# Patient Record
Sex: Male | Born: 1950
Health system: Southern US, Community
[De-identification: ages and names within clinical notes are randomized; demographics above are authoritative.]

## PROBLEM LIST (undated history)

## (undated) DIAGNOSIS — S329XXA Fracture of unspecified parts of lumbosacral spine and pelvis, initial encounter for closed fracture: Secondary | ICD-10-CM

## (undated) DIAGNOSIS — I739 Peripheral vascular disease, unspecified: Secondary | ICD-10-CM

## (undated) DIAGNOSIS — R091 Pleurisy: Secondary | ICD-10-CM

## (undated) DIAGNOSIS — K219 Gastro-esophageal reflux disease without esophagitis: Secondary | ICD-10-CM

## (undated) DIAGNOSIS — I471 Supraventricular tachycardia, unspecified: Secondary | ICD-10-CM

## (undated) DIAGNOSIS — I10 Essential (primary) hypertension: Secondary | ICD-10-CM

## (undated) DIAGNOSIS — S2249XA Multiple fractures of ribs, unspecified side, initial encounter for closed fracture: Secondary | ICD-10-CM

## (undated) DIAGNOSIS — J302 Other seasonal allergic rhinitis: Secondary | ICD-10-CM

## (undated) DIAGNOSIS — K811 Chronic cholecystitis: Secondary | ICD-10-CM

## (undated) DIAGNOSIS — S42009A Fracture of unspecified part of unspecified clavicle, initial encounter for closed fracture: Secondary | ICD-10-CM

## (undated) DIAGNOSIS — K8020 Calculus of gallbladder without cholecystitis without obstruction: Secondary | ICD-10-CM

## (undated) DIAGNOSIS — E11319 Type 2 diabetes mellitus with unspecified diabetic retinopathy without macular edema: Secondary | ICD-10-CM

## (undated) DIAGNOSIS — R0600 Dyspnea, unspecified: Secondary | ICD-10-CM

## (undated) DIAGNOSIS — E119 Type 2 diabetes mellitus without complications: Secondary | ICD-10-CM

## (undated) DIAGNOSIS — E785 Hyperlipidemia, unspecified: Secondary | ICD-10-CM

## (undated) DIAGNOSIS — K760 Fatty (change of) liver, not elsewhere classified: Secondary | ICD-10-CM

## (undated) DIAGNOSIS — J449 Chronic obstructive pulmonary disease, unspecified: Secondary | ICD-10-CM

## (undated) HISTORY — PX: ERCP: SHX60

## (undated) HISTORY — DX: Type 2 diabetes mellitus with unspecified diabetic retinopathy without macular edema: E11.319

## (undated) HISTORY — PX: FOOT SURGERY: SHX648

## (undated) HISTORY — PX: CHOLECYSTECTOMY: SHX55

---

## 1990-09-27 DIAGNOSIS — S329XXA Fracture of unspecified parts of lumbosacral spine and pelvis, initial encounter for closed fracture: Secondary | ICD-10-CM

## 1990-09-27 DIAGNOSIS — S2249XA Multiple fractures of ribs, unspecified side, initial encounter for closed fracture: Secondary | ICD-10-CM

## 1990-09-27 DIAGNOSIS — S42009A Fracture of unspecified part of unspecified clavicle, initial encounter for closed fracture: Secondary | ICD-10-CM

## 1990-09-27 HISTORY — DX: Fracture of unspecified part of unspecified clavicle, initial encounter for closed fracture: S42.009A

## 1990-09-27 HISTORY — DX: Multiple fractures of ribs, unspecified side, initial encounter for closed fracture: S22.49XA

## 1990-09-27 HISTORY — DX: Fracture of unspecified parts of lumbosacral spine and pelvis, initial encounter for closed fracture: S32.9XXA

## 2003-07-30 ENCOUNTER — Other Ambulatory Visit: Payer: Self-pay

## 2006-09-27 DIAGNOSIS — K760 Fatty (change of) liver, not elsewhere classified: Secondary | ICD-10-CM

## 2006-09-27 HISTORY — DX: Fatty (change of) liver, not elsewhere classified: K76.0

## 2006-09-27 HISTORY — PX: BACK SURGERY: SHX140

## 2006-12-27 ENCOUNTER — Ambulatory Visit: Payer: Self-pay | Admitting: Internal Medicine

## 2006-12-28 ENCOUNTER — Inpatient Hospital Stay: Payer: Self-pay | Admitting: General Surgery

## 2006-12-28 ENCOUNTER — Other Ambulatory Visit: Payer: Self-pay

## 2006-12-28 ENCOUNTER — Ambulatory Visit: Payer: Self-pay | Admitting: Internal Medicine

## 2006-12-28 IMAGING — US ABDOMEN ULTRASOUND
1 series · 17 of 25 positions shown · non-contrast
Comparison: none

REASON FOR EXAM: RUQ abd pain Hx gallstones
COMMENTS:

[Series 1: abdomen ultrasound · 17 of 63 slices shown]
[im 1/63]
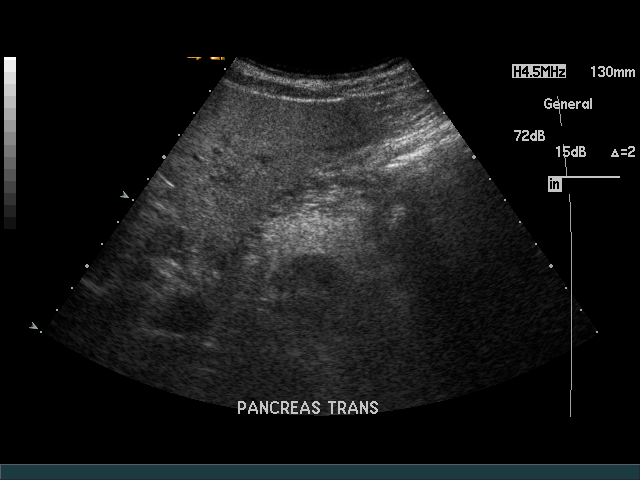
[im 6/63]
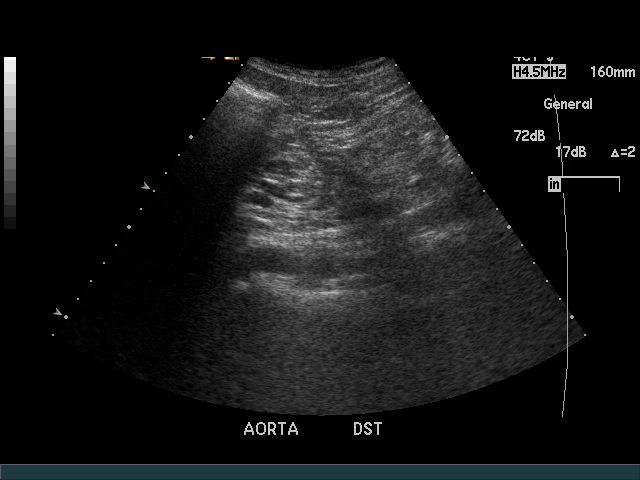
[im 8/63]
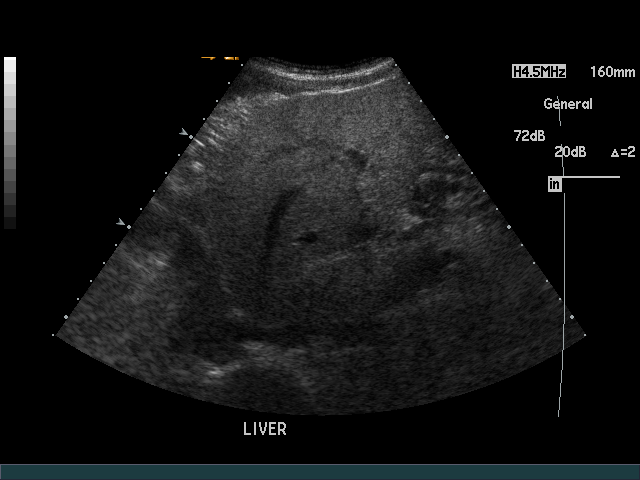
[im 13/63]
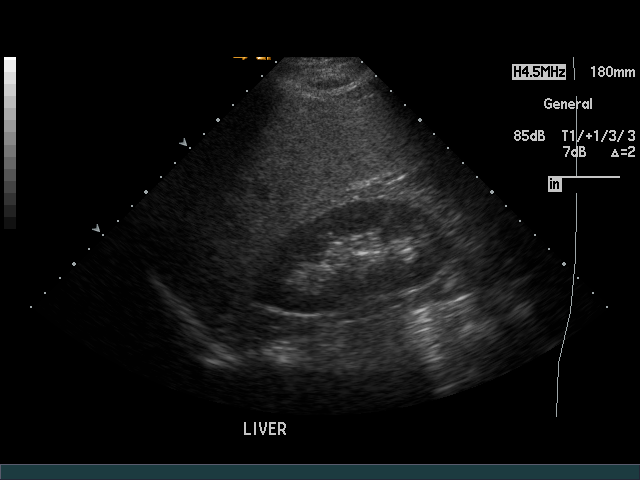
[im 16/63]
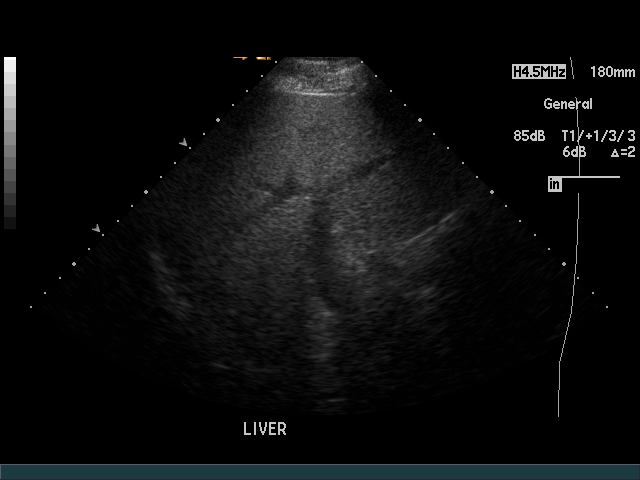
[im 21/63]
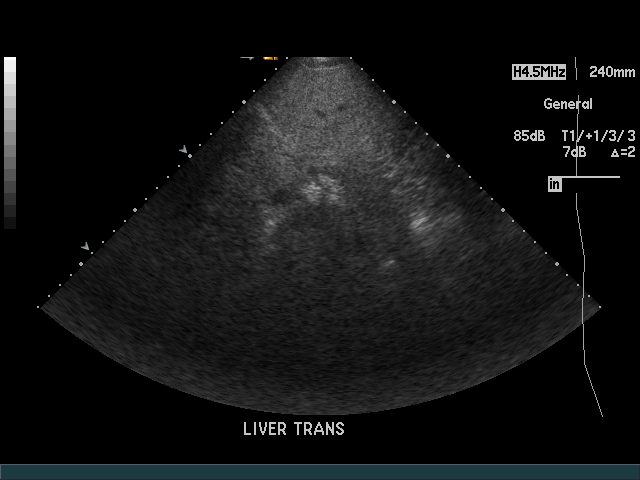
[im 24/63]
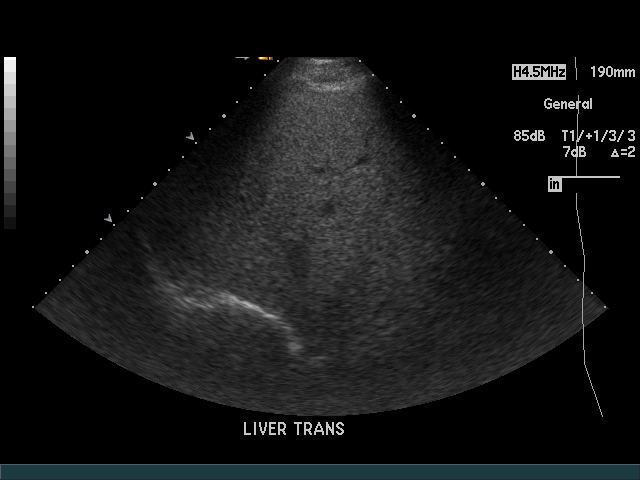
[im 29/63]
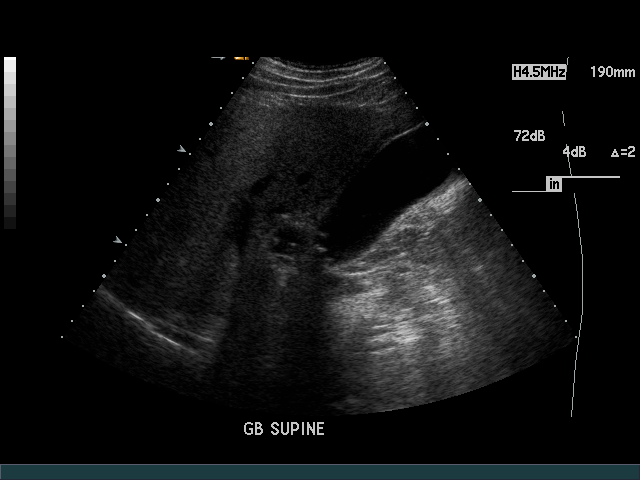
[im 32/63]
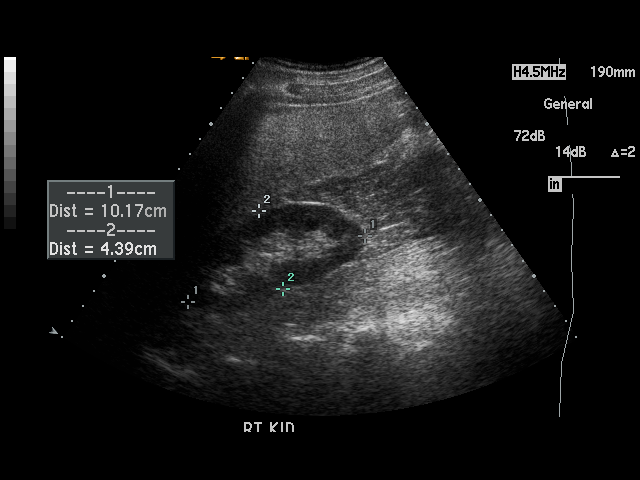
[im 34/63]
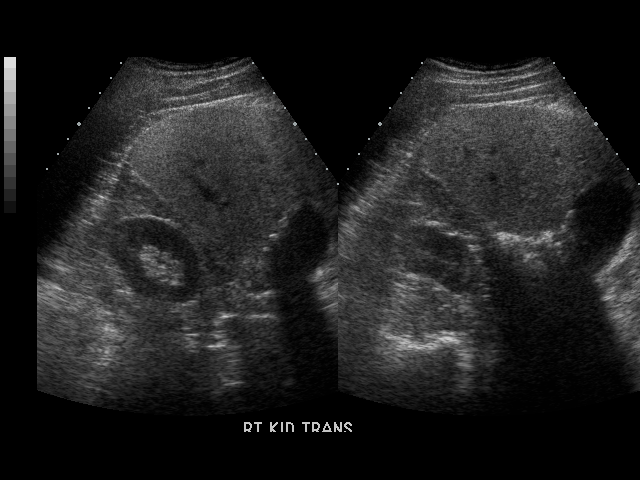
[im 39/63]
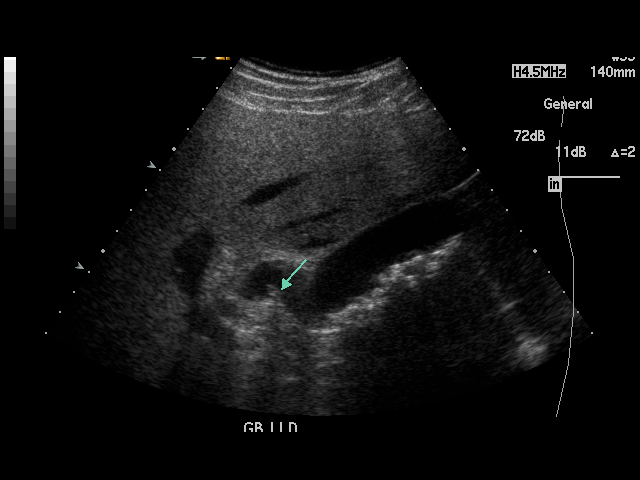
[im 42/63]
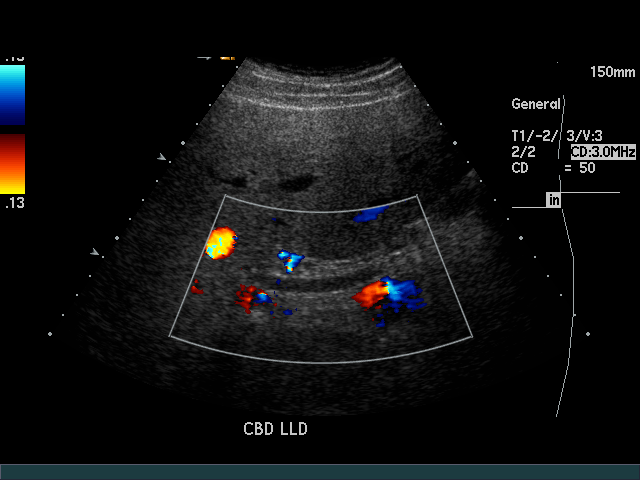
[im 47/63]
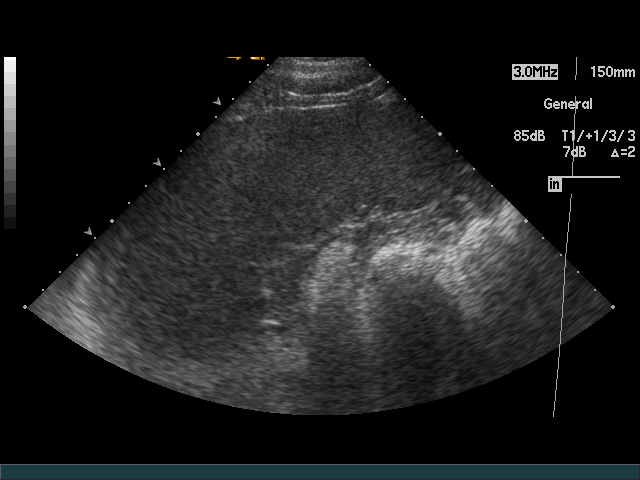
[im 50/63]
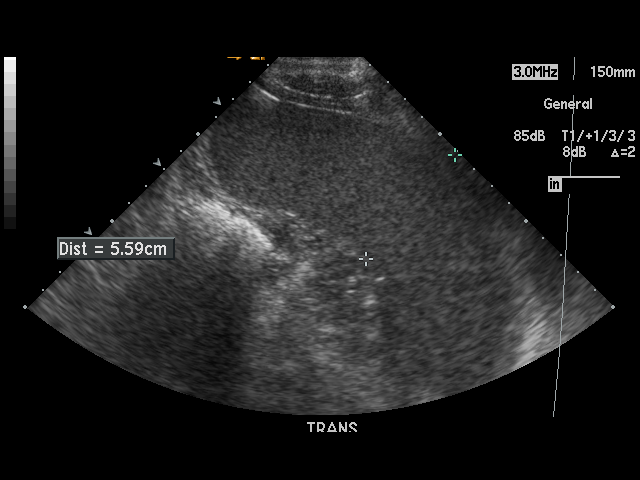
[im 55/63]
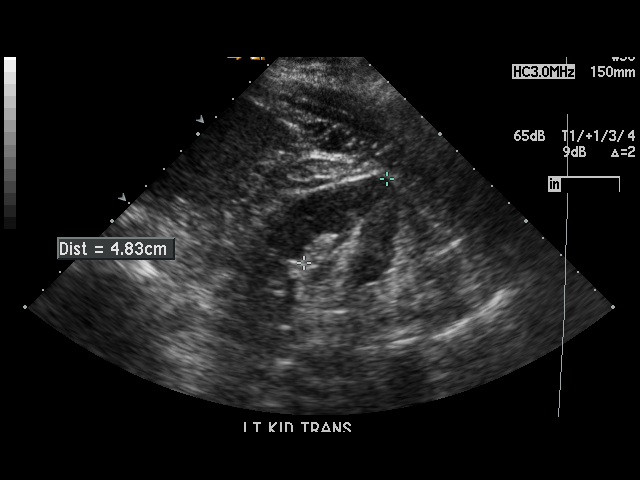
[im 57/63]
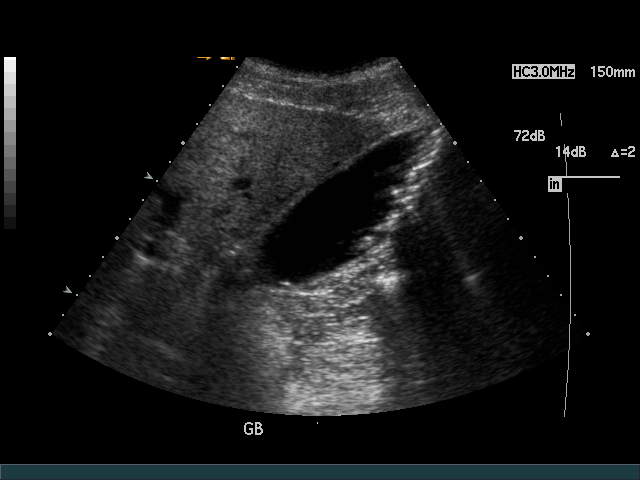
[im 63/63]
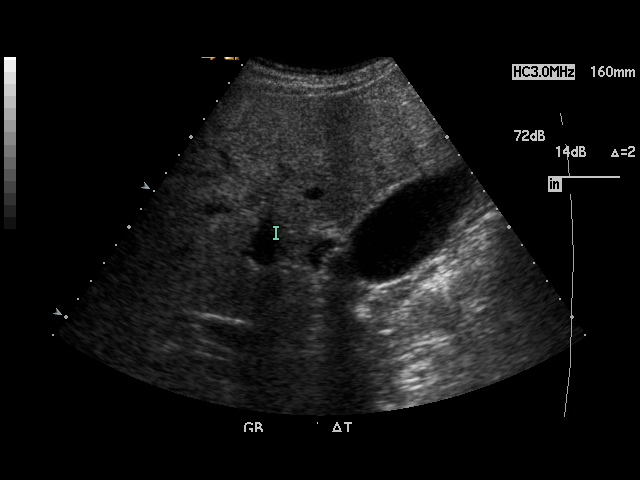

[17 of 25 positions shown; findings below may reference images not displayed]

PROCEDURE:     US  - US ABDOMEN GENERAL SURVEY  - [DATE] [DATE]

RESULT:      The liver exhibits mildly increased echotexture consistent with
fatty infiltration. No focal mass or ductal dilation is seen. Portal venous
flow is normal in direction toward the liver. The gallbladder contains
multiple echogenic shadowing stones. There is a positive sonographic Murphy
sign. The gallbladder wall does not appear abnormally thickened at 2.5 mm. I
cannot exclude a stone in the gallbladder neck. The common bile duct is
mildly dilated at 6.5 mm.

Spleen is enlarged at nearly 17 cm in length. Evaluation of the pancreas is
limited due to the presence of bowel gas. The abdominal aorta and kidneys
exhibit no acute abnormality. There is no evidence of ascites.
IMPRESSION: 1.  There are multiple gallstones present. One stone may be impacted in the
gallbladder neck. No gallbladder wall thickening or pericholecystic fluid is
seen. However, there is a positive sonographic Murphy's sign.
2. The common bile duct is borderline dilated at 6.5 mm.
3. There are findings that are consistent with fatty infiltration of the
liver. The pancreas could only be partially demonstrated.
4. The spleen is enlarged but this was not new finding and has been
described as far back as [AP].
5. The abdominal aorta is top normal in diameter at 2.6 cm.

This report was called at the conclusion of the study.

## 2006-12-30 IMAGING — CR DG CHEST 1V PORT
1 series · 2 of 2 positions shown · non-contrast
Comparison: none

REASON FOR EXAM: renal failure- eval for CHF, pericardial effusion
COMMENTS:

PROCEDURE:     DXR - DXR PORTABLE CHEST SINGLE VIEW  - [DATE]  [DATE]
RESULT:     The lungs are adequately inflated.  The interstitial markings
are mildly increased. The cardiac silhouette is normal in size. The
pulmonary vascularity is indistinct centrally.

[Series 1: view not recorded · 0.17mm/px · 2 of 2 slices shown]
[im 1/2]
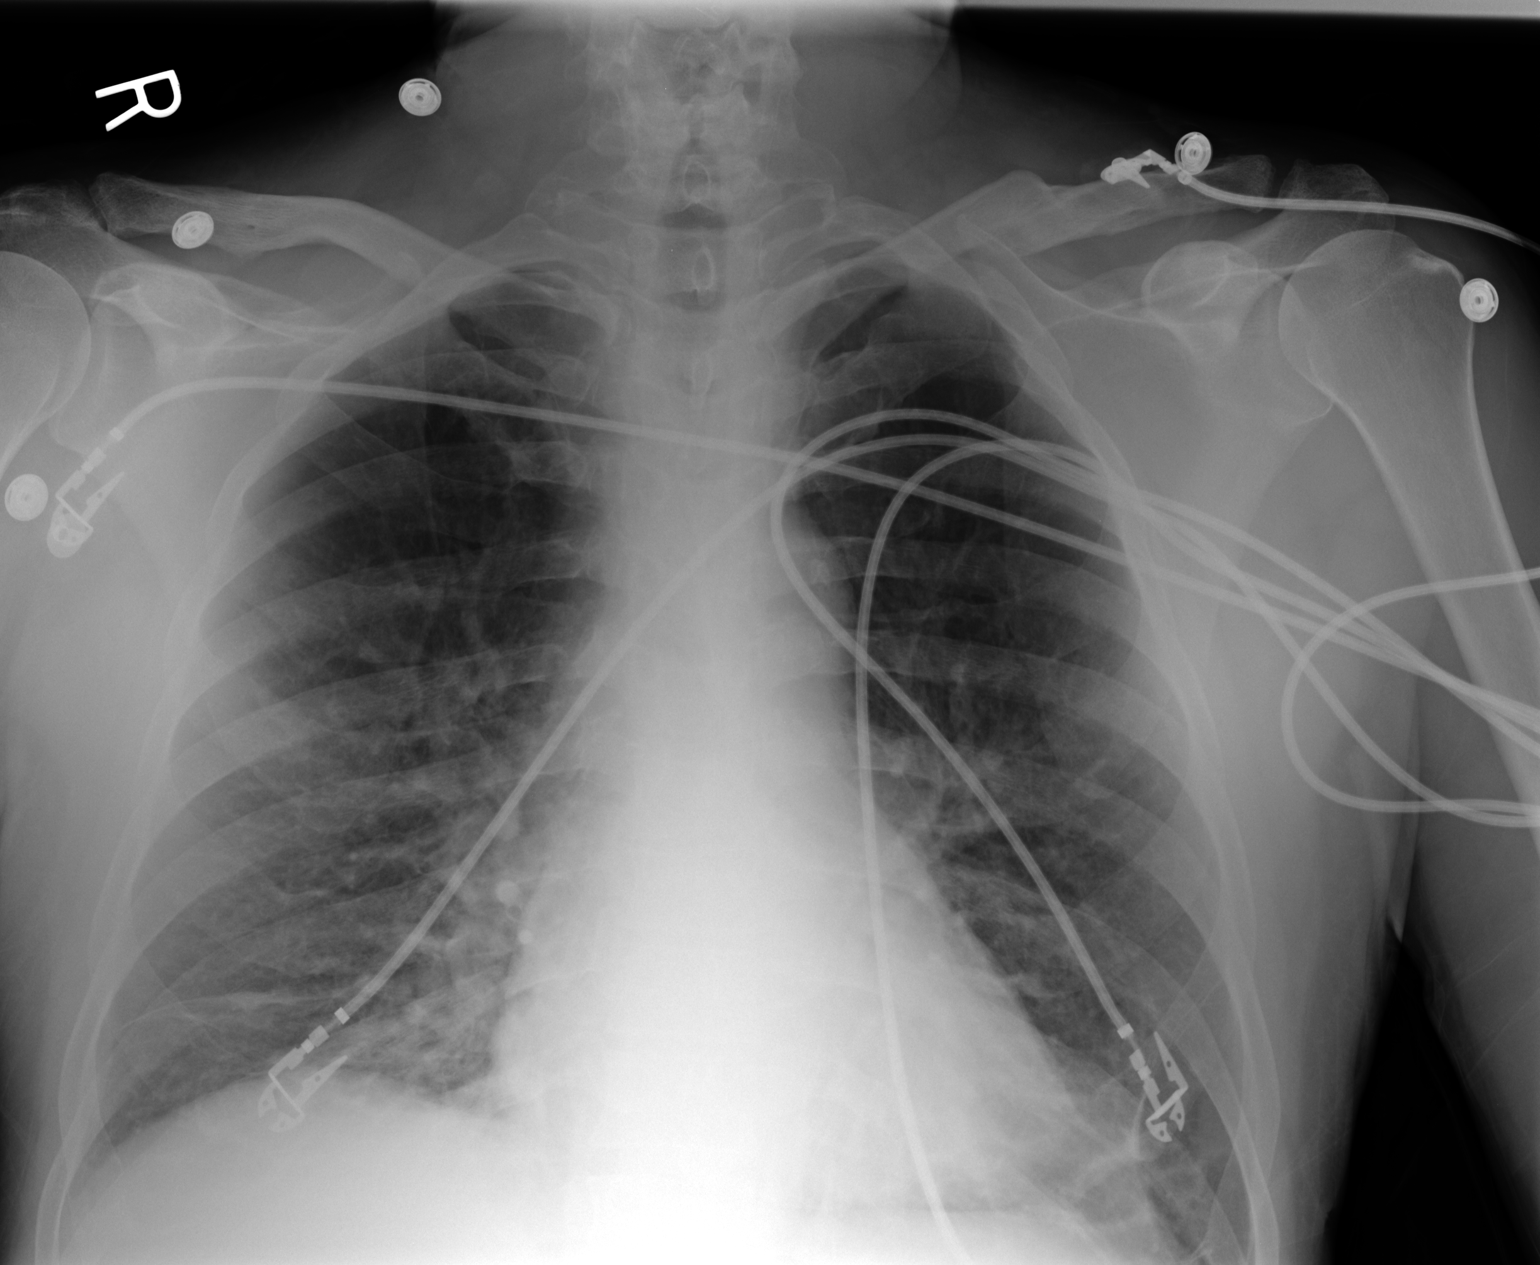
[im 2/2]
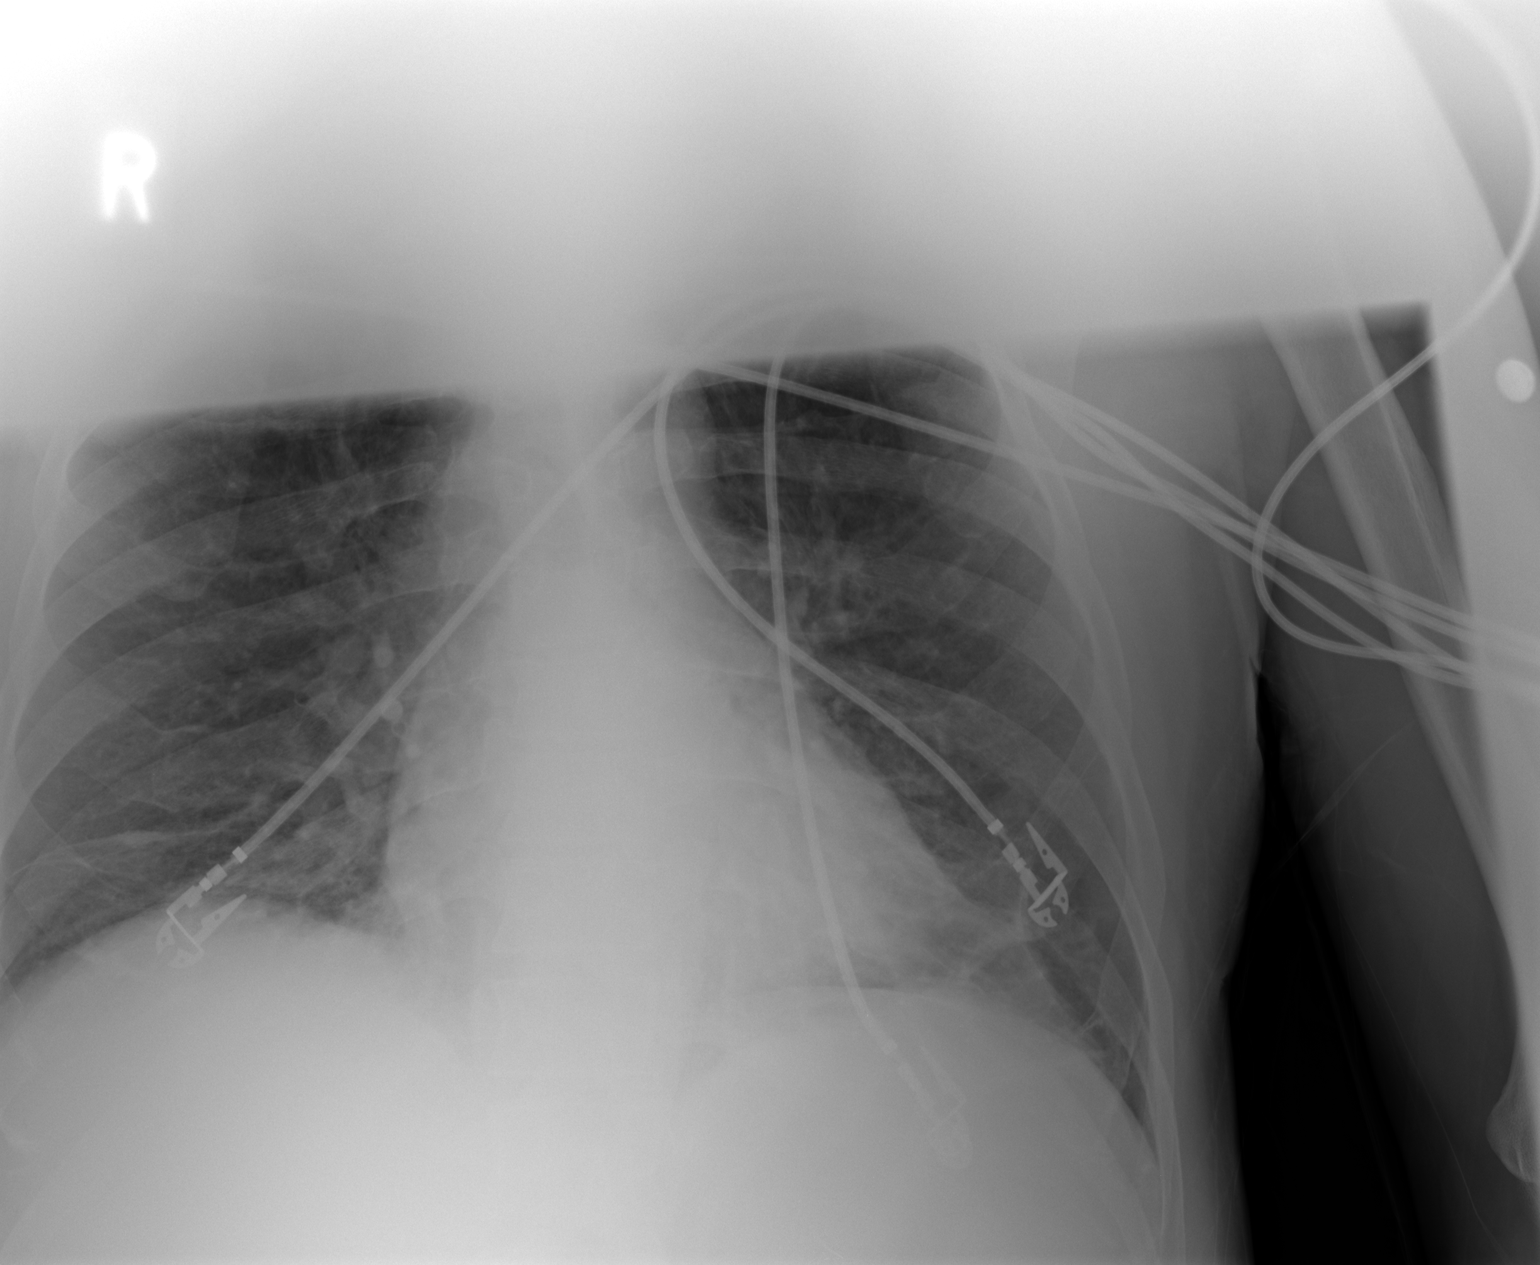

[2 of 2 positions shown; findings below may reference images not displayed]

IMPRESSION: 1.  I do not see evidence of a pleural or pericardial effusion. Mildly
increased interstitial markings may reflect an element of pulmonary
interstitial edema. The cardiac silhouette does not appear enlarged.
2. There is an old healed mid clavicular fracture of the LEFT clavicle.

## 2006-12-30 IMAGING — CT CT ASPIRATION
1 series · 2 of 4 positions shown, 5 images · non-contrast
Comparison: none

REASON FOR EXAM: Abnormal hepatic function
COMMENTS:

[Series 2: (hospital) 2.4 b30s · axial · 0.74mm/px · z∈[-366,-362]mm · 2 of 4 slices shown, 5 images]
[im 2/4  soft-tissue]
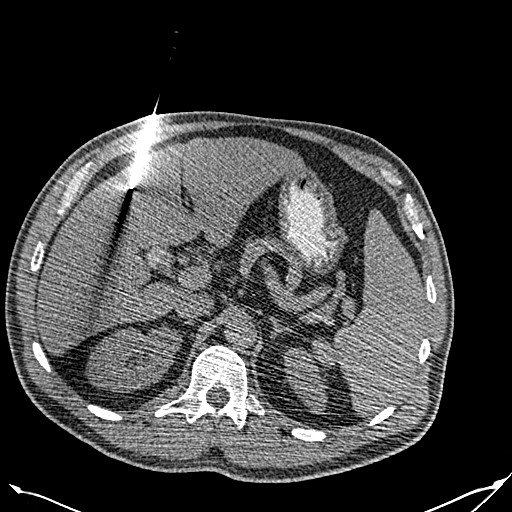
[im 2/4  lung]
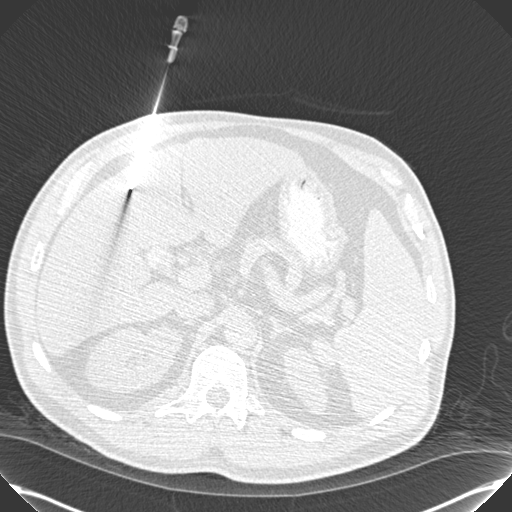
[im 2/4  bone]
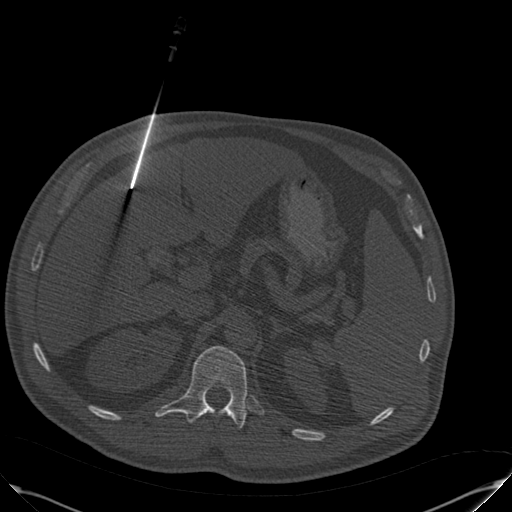
[im 3/4  soft-tissue]
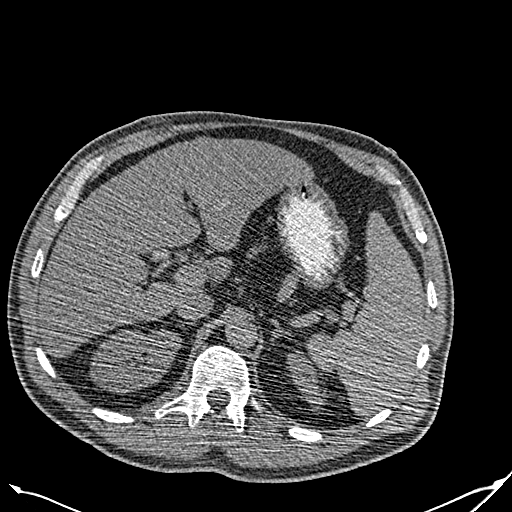
[im 3/4  lung]
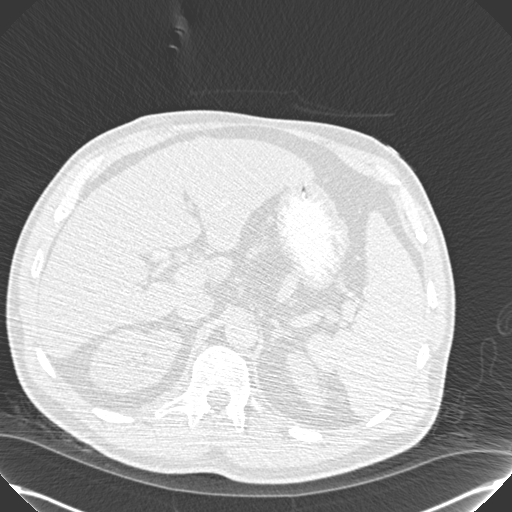

[2 of 4 positions shown; findings below may reference images not displayed]

PROCEDURE:     CT  - CT GUIDED BIOPSY or ASPIRATION  - [DATE]  [DATE]

RESULT:     The patient has a history of liver disease.

PROCEDURE AND FINDINGS:  After discussing the risks and benefits of this
procedure with the patient, including the risk of bleeding and given the
patient's elevated coagulation parameters, informed consent was obtained.
The biopsy was discussed with Dr. RODOLPHO who indicated the need for this
biopsy even with the elevated coagulation parameters.

An 18 gauge, Achieve needle system was advanced into the liver and three
samples were obtained. There were no complications. No evidence of bleeding
post biopsy.
IMPRESSION: Successful CT directed liver biopsy.

## 2006-12-30 IMAGING — CT CT ABD-PELV W/O CM
1 of 2 series · 16 of 32 positions shown, 20 images · non-contrast
Comparison: none

REASON FOR EXAM: Abdominal pain, abnormal hepatic function
COMMENTS:

PROCEDURE:     CT  - CT ABDOMEN AND PELVIS W[DATE] [DATE]
RESULT:     The patient has a history of abdominal pain and abnormal hepatic
function.
TECHNIQUE: Nonenhanced CT scan of abdomen and pelvis is obtained.

[Series 2: soft tissue · axial · 0.75mm/px · z∈[-757,-327]mm · 16 of 94 slices shown, 20 images]
[im 4/94  soft-tissue]
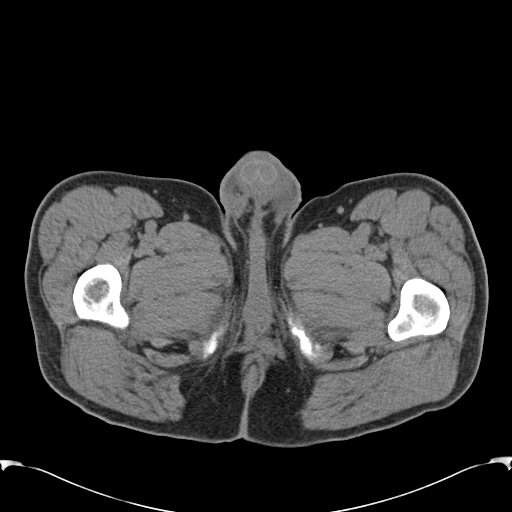
[im 4/94  bone]
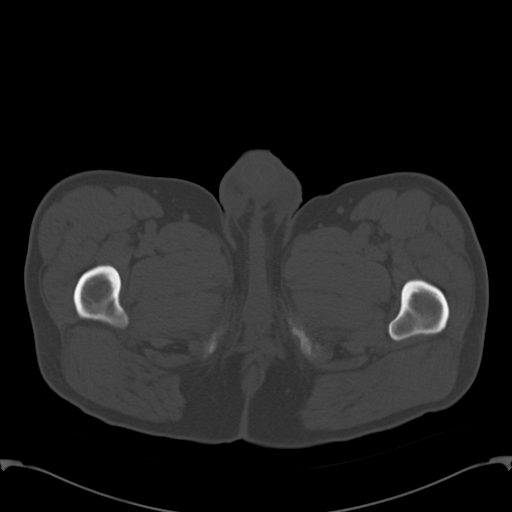
[im 12/94  soft-tissue]
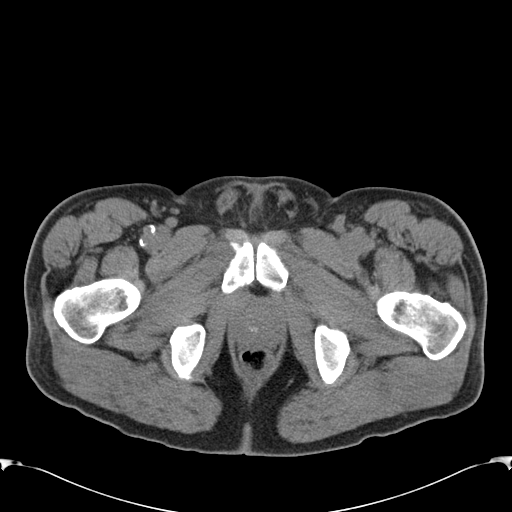
[im 20/94  soft-tissue]
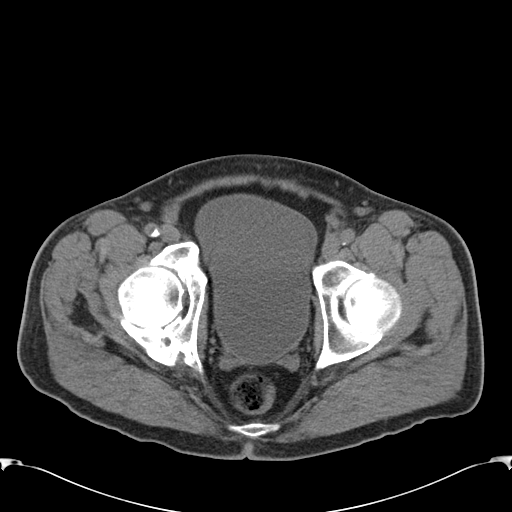
[im 24/94  soft-tissue]
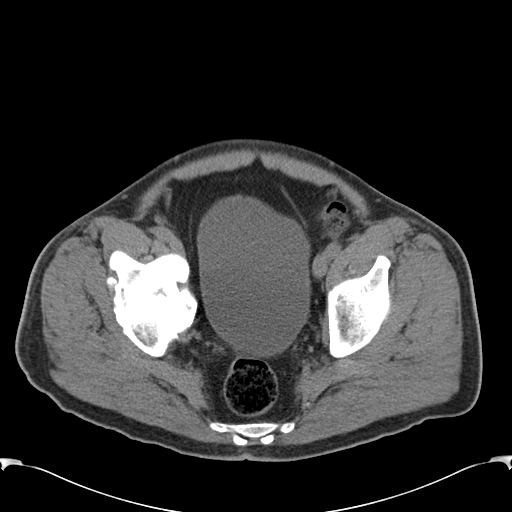
[im 32/94  soft-tissue]
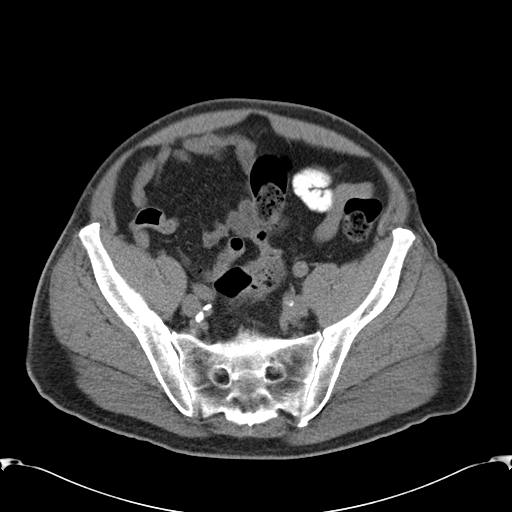
[im 39/94  soft-tissue]
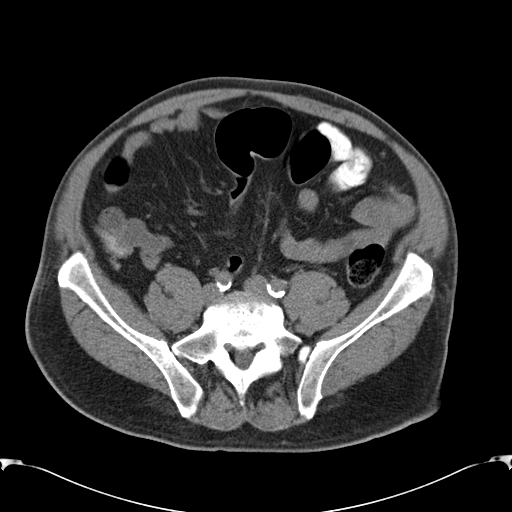
[im 43/94  soft-tissue]
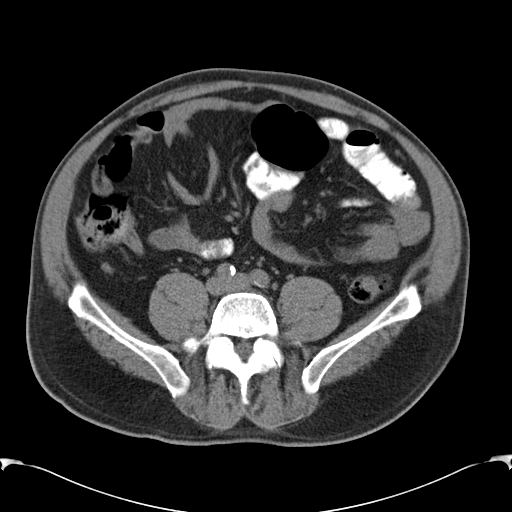
[im 51/94  soft-tissue]
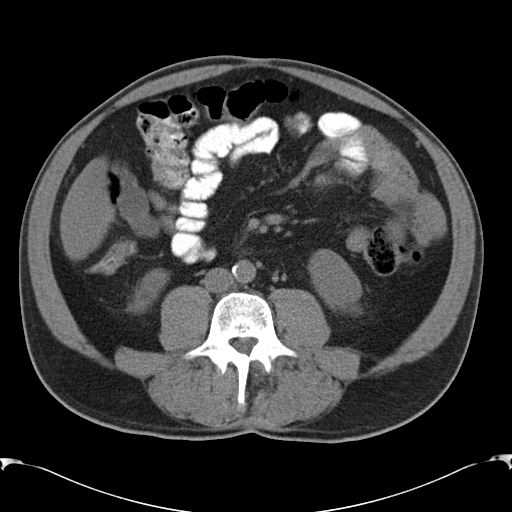
[im 55/94  soft-tissue]
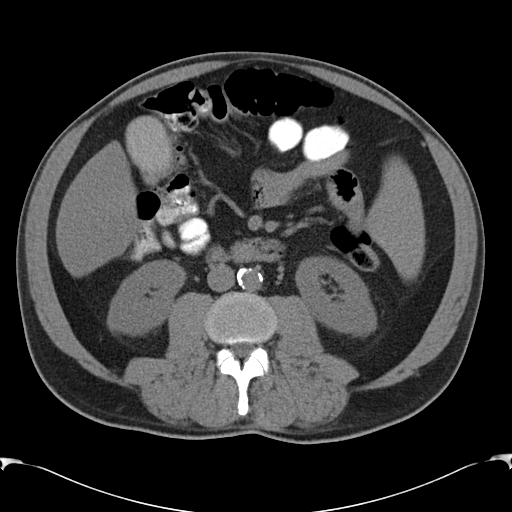
[im 55/94  bone]
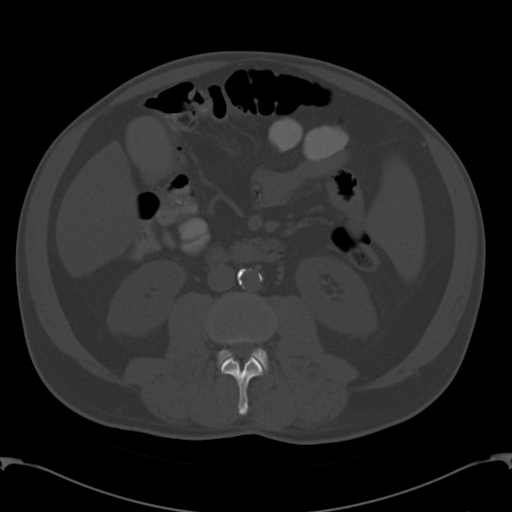
[im 63/94  soft-tissue]
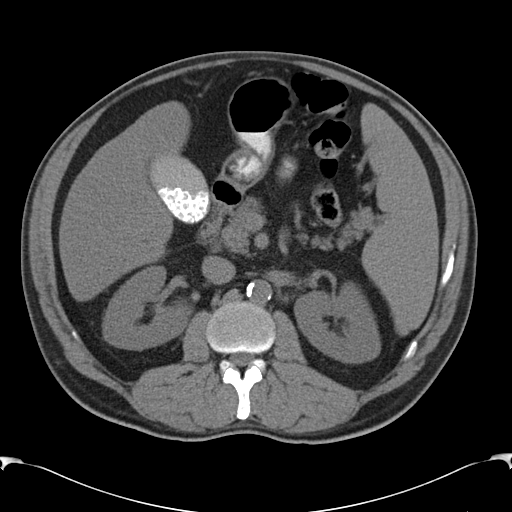
[im 70/94  soft-tissue]
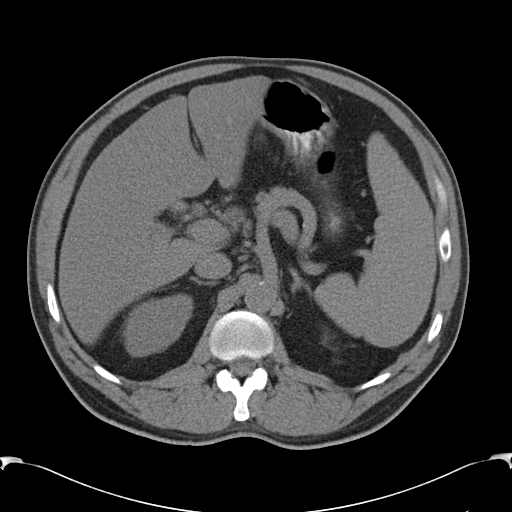
[im 74/94  soft-tissue]
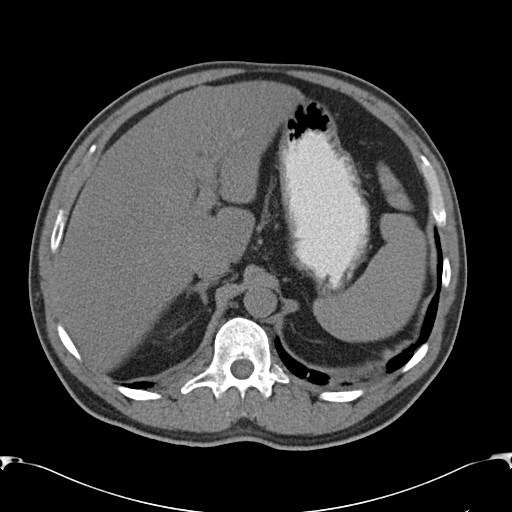
[im 78/94  lung]
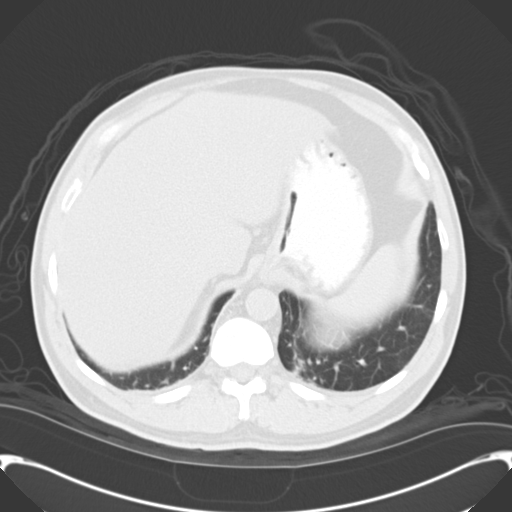
[im 82/94  soft-tissue]
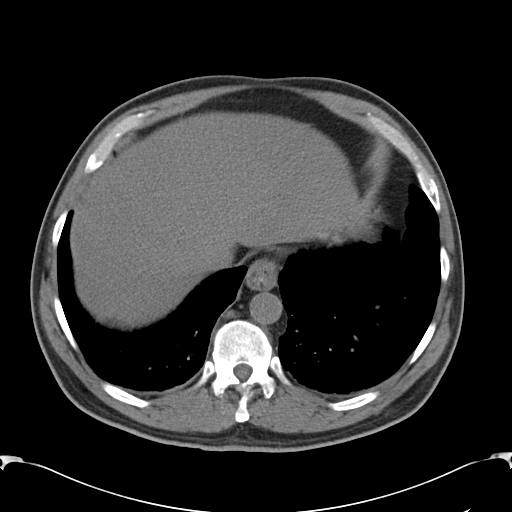
[im 82/94  lung]
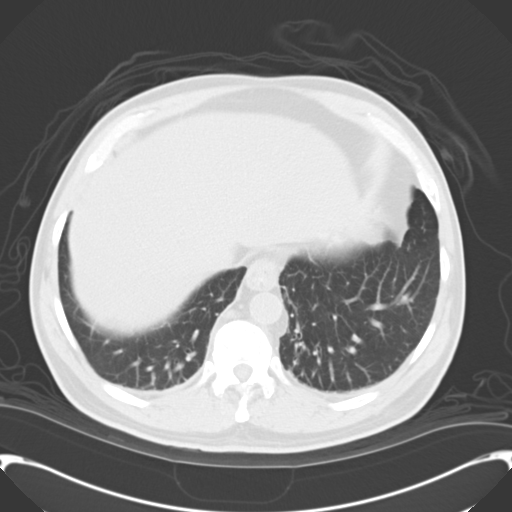
[im 86/94  lung]
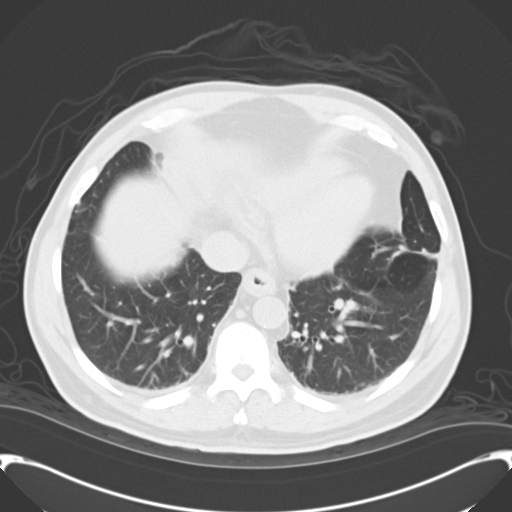
[im 90/94  soft-tissue]
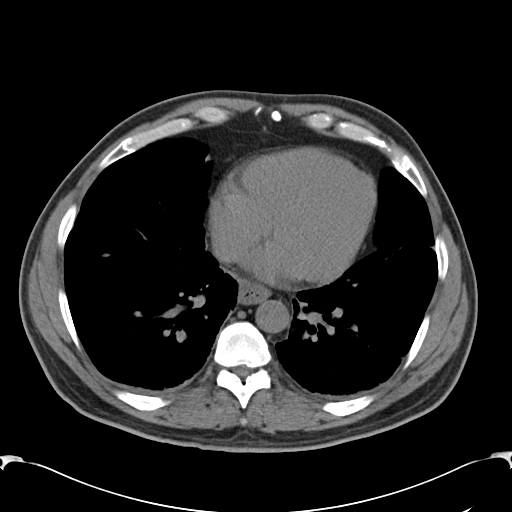
[im 90/94  lung]
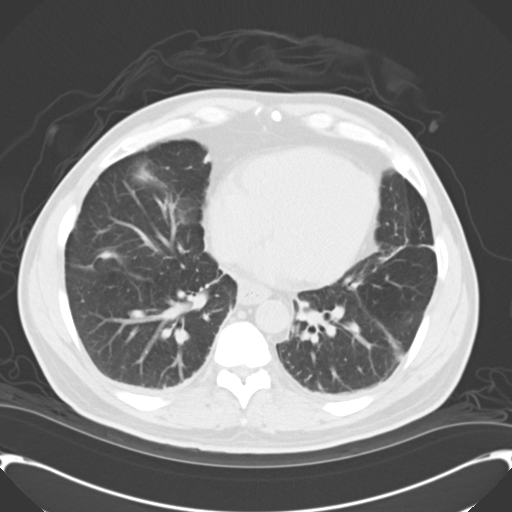

[16 of 32 positions shown; findings below may reference images not displayed]

FINDINGS: The liver is normal. Borderline splenomegaly is present.
Gallstones are present. There is no gallbladder wall thickening or
pericholecystic fluid collections. The adrenals and kidneys are normal.
There is no bowel distension. The appendix is normal. The bladder is normal.
No inguinal adenopathy is noted. Basilar atelectasis is present.
IMPRESSION: 1.     Cholelithiasis.
2.     Borderline splenomegaly.

## 2007-01-18 ENCOUNTER — Ambulatory Visit: Payer: Self-pay | Admitting: Internal Medicine

## 2007-01-26 ENCOUNTER — Ambulatory Visit: Payer: Self-pay | Admitting: Internal Medicine

## 2007-01-30 ENCOUNTER — Ambulatory Visit: Payer: Self-pay | Admitting: Internal Medicine

## 2007-03-15 ENCOUNTER — Ambulatory Visit: Payer: Self-pay | Admitting: Internal Medicine

## 2007-03-28 ENCOUNTER — Ambulatory Visit: Payer: Self-pay | Admitting: Internal Medicine

## 2007-04-28 ENCOUNTER — Ambulatory Visit: Payer: Self-pay | Admitting: Internal Medicine

## 2007-05-24 ENCOUNTER — Ambulatory Visit: Payer: Self-pay | Admitting: Internal Medicine

## 2007-05-24 IMAGING — US ABDOMEN ULTRASOUND
1 series · 17 of 25 positions shown · non-contrast
Comparison: none

REASON FOR EXAM: Splenomegally
COMMENTS:

[Series 1: abdomen ultrasound · 17 of 47 slices shown]
[im 1/47]
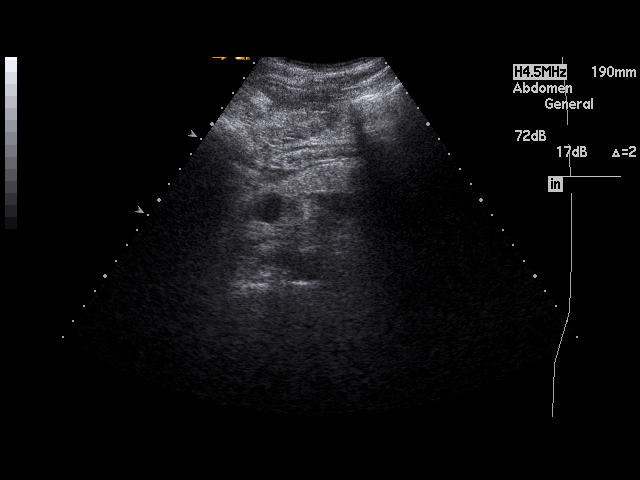
[im 4/47]
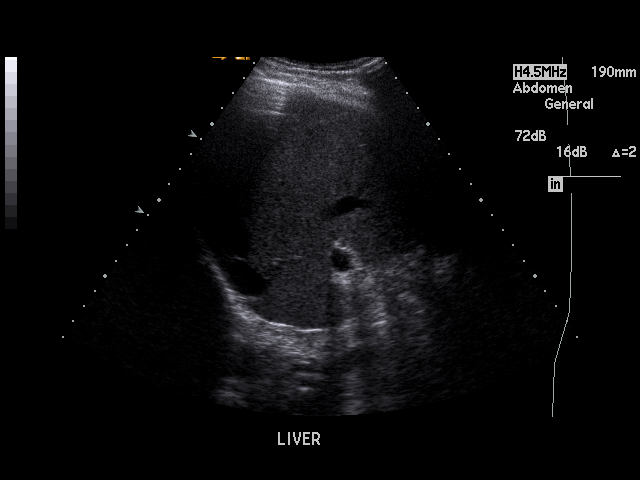
[im 6/47]
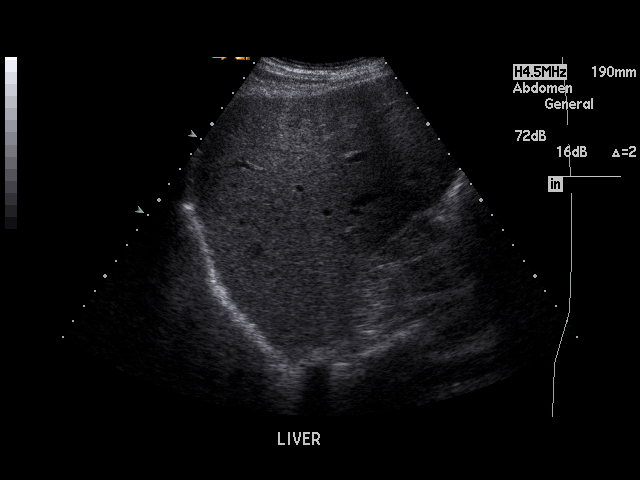
[im 10/47]
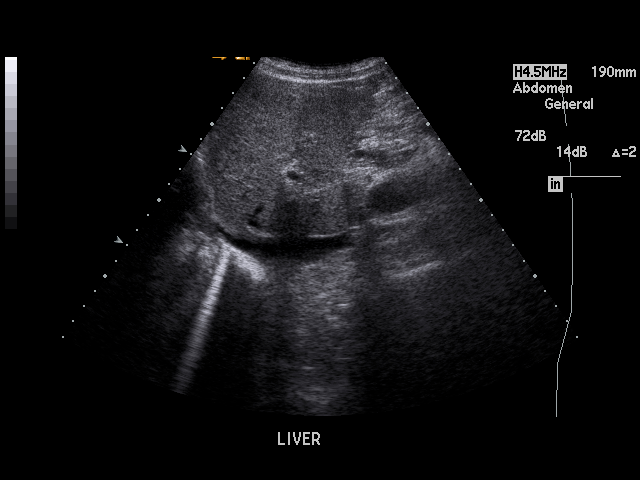
[im 12/47]
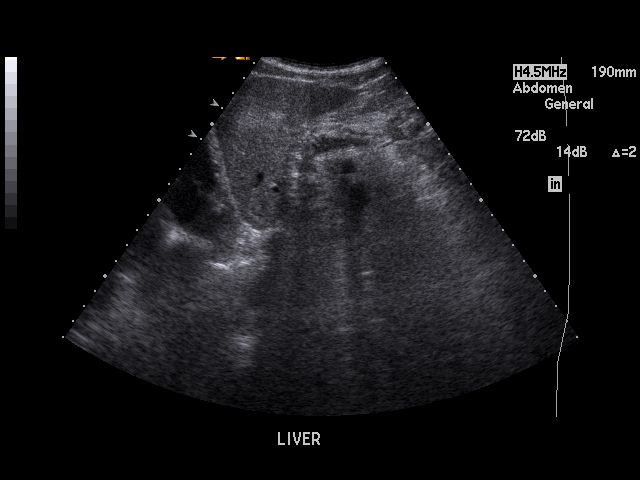
[im 16/47]
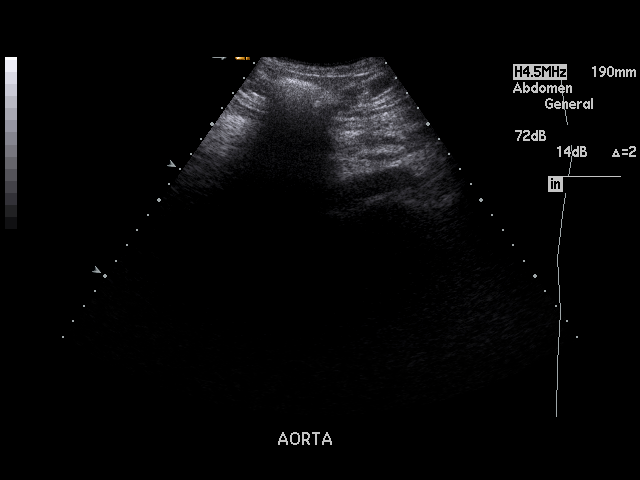
[im 18/47]
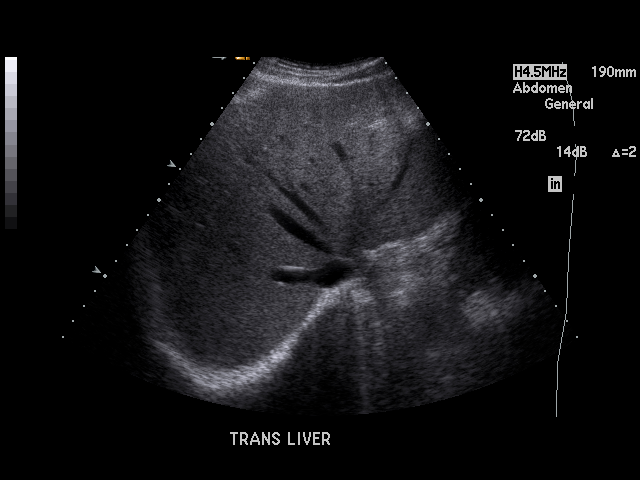
[im 22/47]
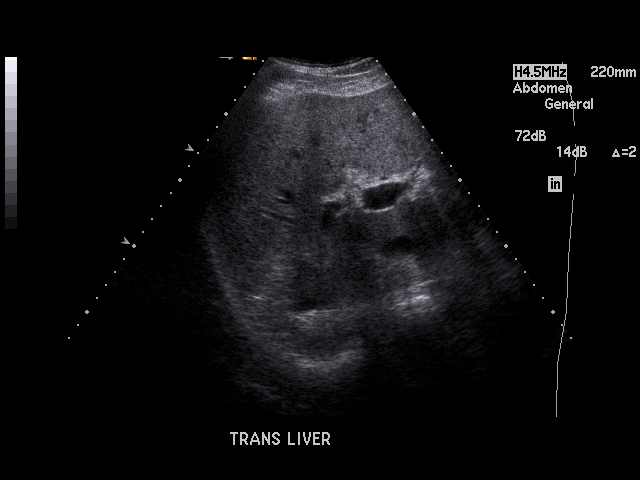
[im 24/47]
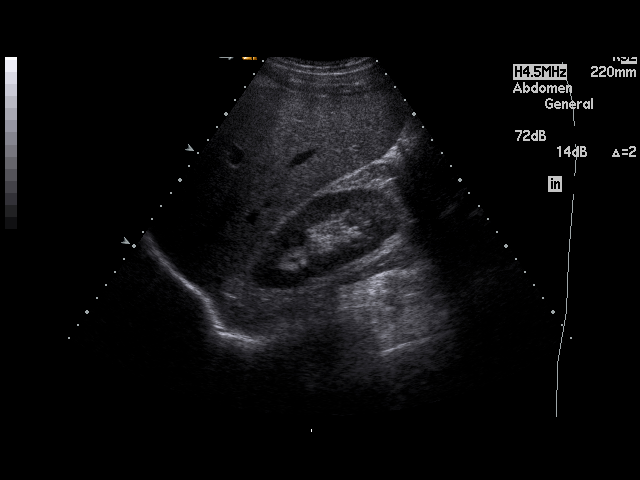
[im 25/47]
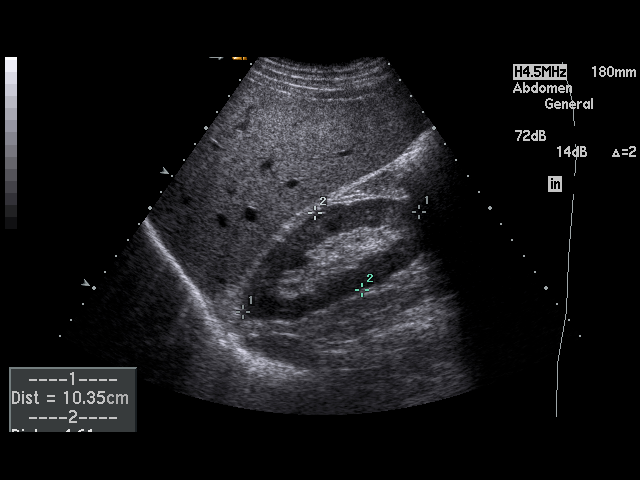
[im 29/47]
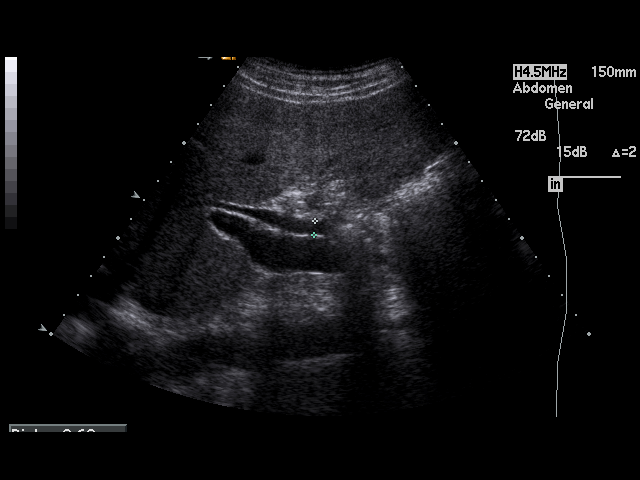
[im 31/47]
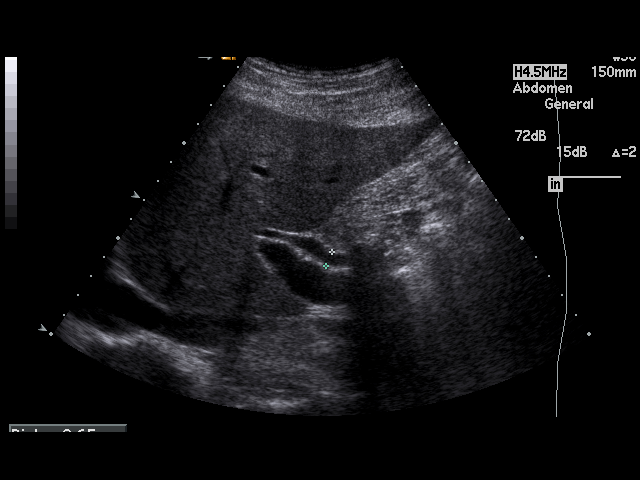
[im 35/47]
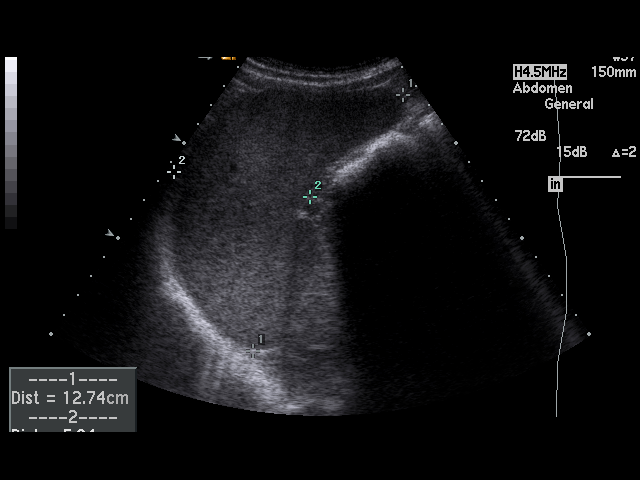
[im 37/47]
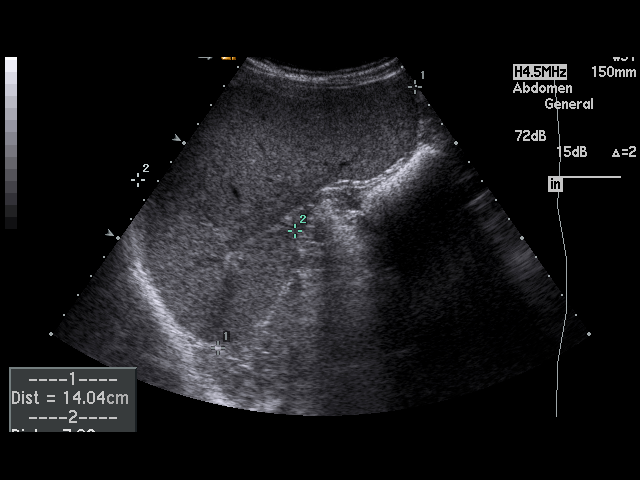
[im 41/47]
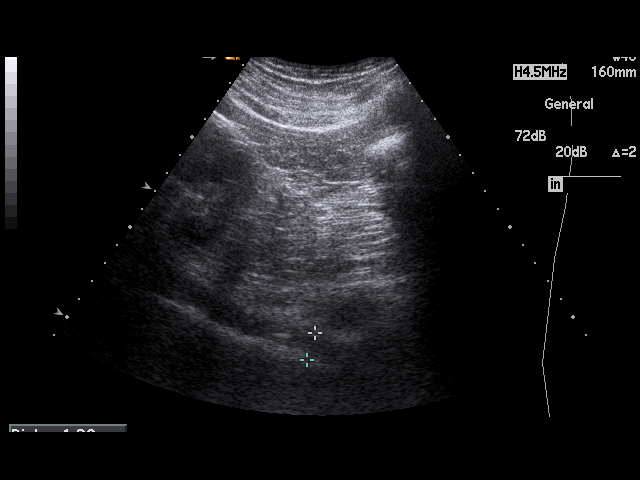
[im 43/47]
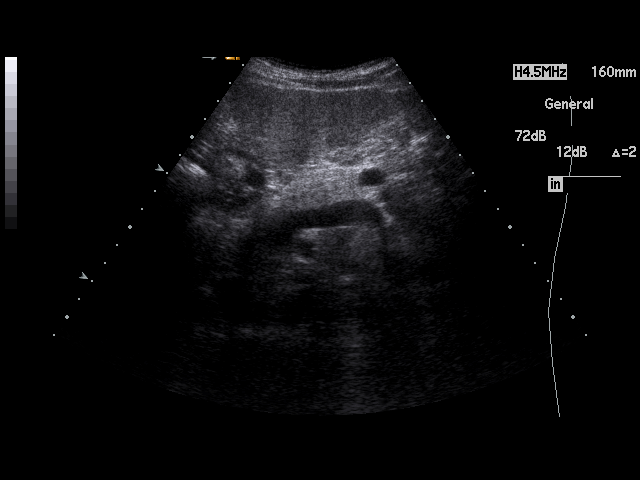
[im 47/47]
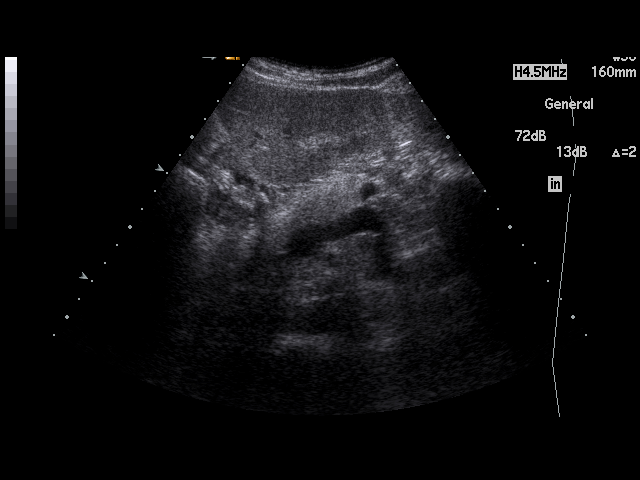

[17 of 25 positions shown; findings below may reference images not displayed]

PROCEDURE:     US  - US ABDOMEN GENERAL SURVEY  - [DATE] [DATE]

RESULT:     Comparison is made to a prior exam of [DATE]. On the current
exam, the hepatic echo pattern appears normal. The spleen is mildly
enlarged. The spleen measured 14.15 cm in AP diameter. The spleen size has
decreased since the prior exam of [DATE] at which time the spleen
measured 16.7 cm in AP diameter. The pancreas is normal in appearance. The
patient is status post cholecystectomy. The kidneys show no hydronephrosis.
There is no ascites.
IMPRESSION: 1. The patient is status post cholecystectomy.
2. The common bile duct measures 6.6 mm in diameter which is within normal
limits.
3. The spleen is enlarged but has decreased in size since the prior exam.

## 2007-05-29 ENCOUNTER — Ambulatory Visit: Payer: Self-pay | Admitting: Internal Medicine

## 2007-08-18 ENCOUNTER — Ambulatory Visit: Payer: Self-pay | Admitting: Family Medicine

## 2007-08-18 IMAGING — CR RIGHT HIP - COMPLETE 2+ VIEW
1 series · 2 of 2 positions shown · non-contrast
Comparison: none

REASON FOR EXAM: pain
COMMENTS:

[Series 1: view not recorded · 0.17mm/px · 2 of 2 slices shown]
[im 1/2]
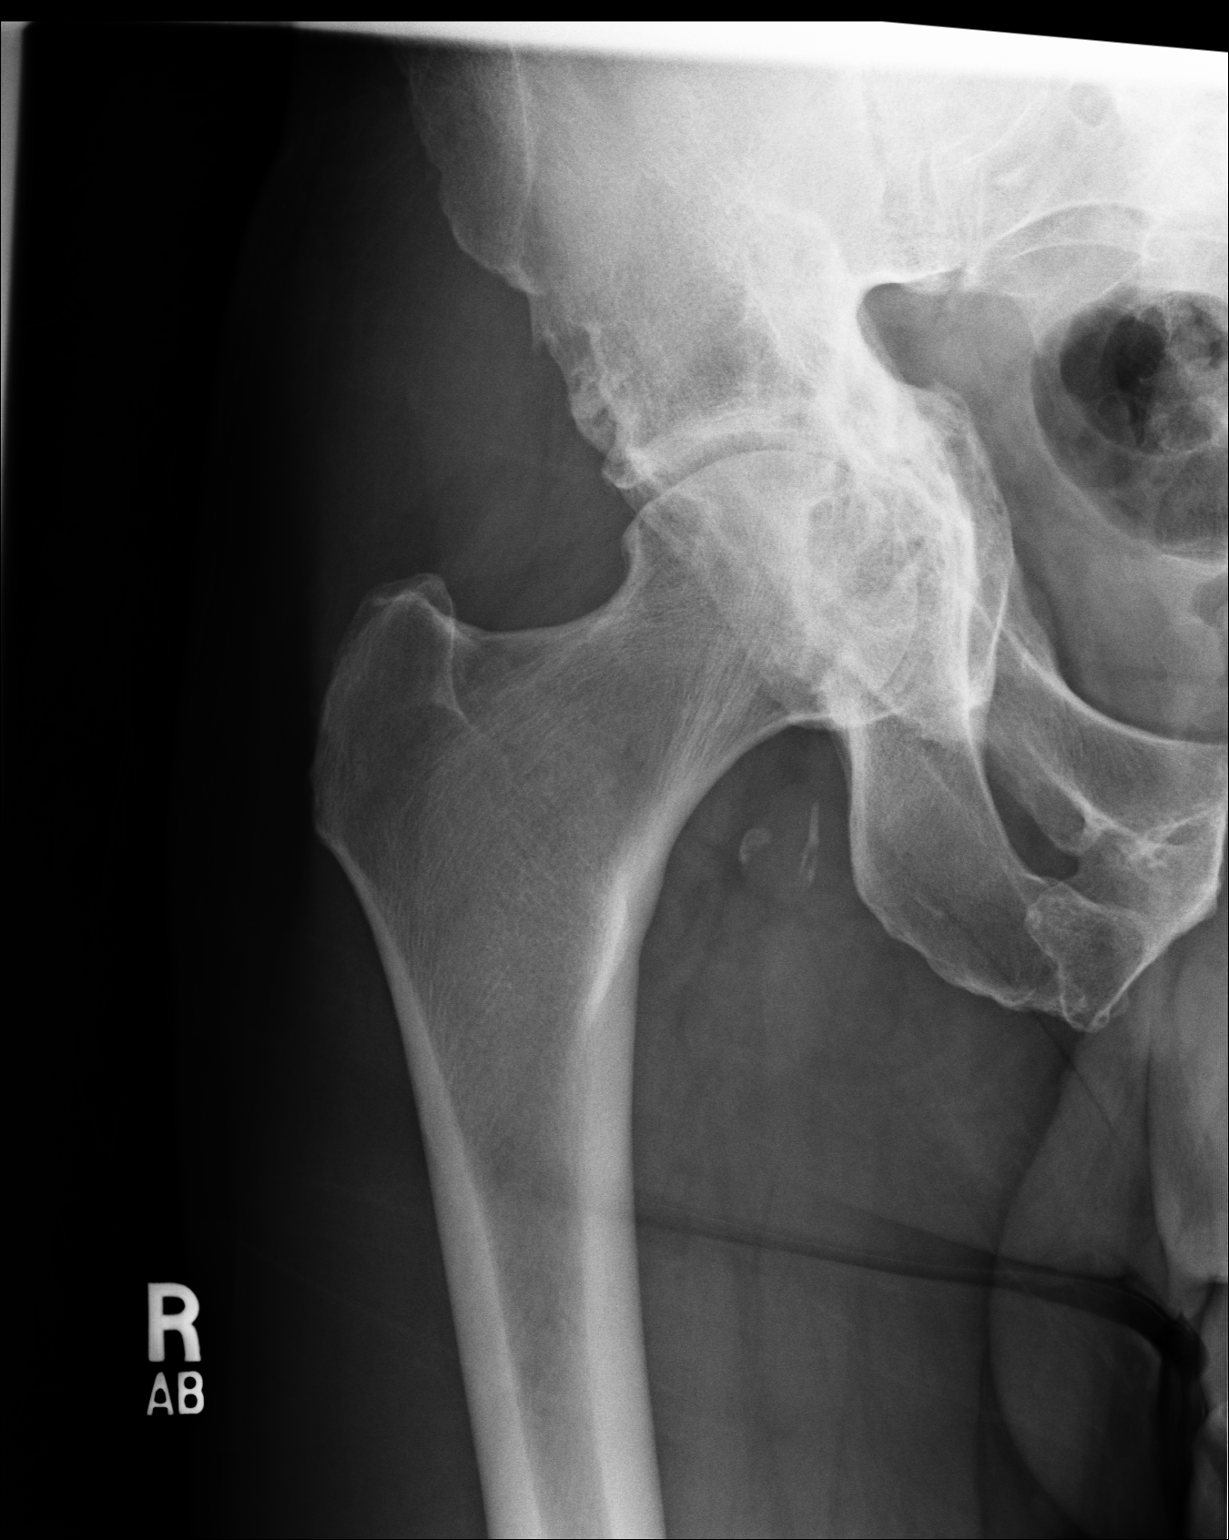
[im 2/2]
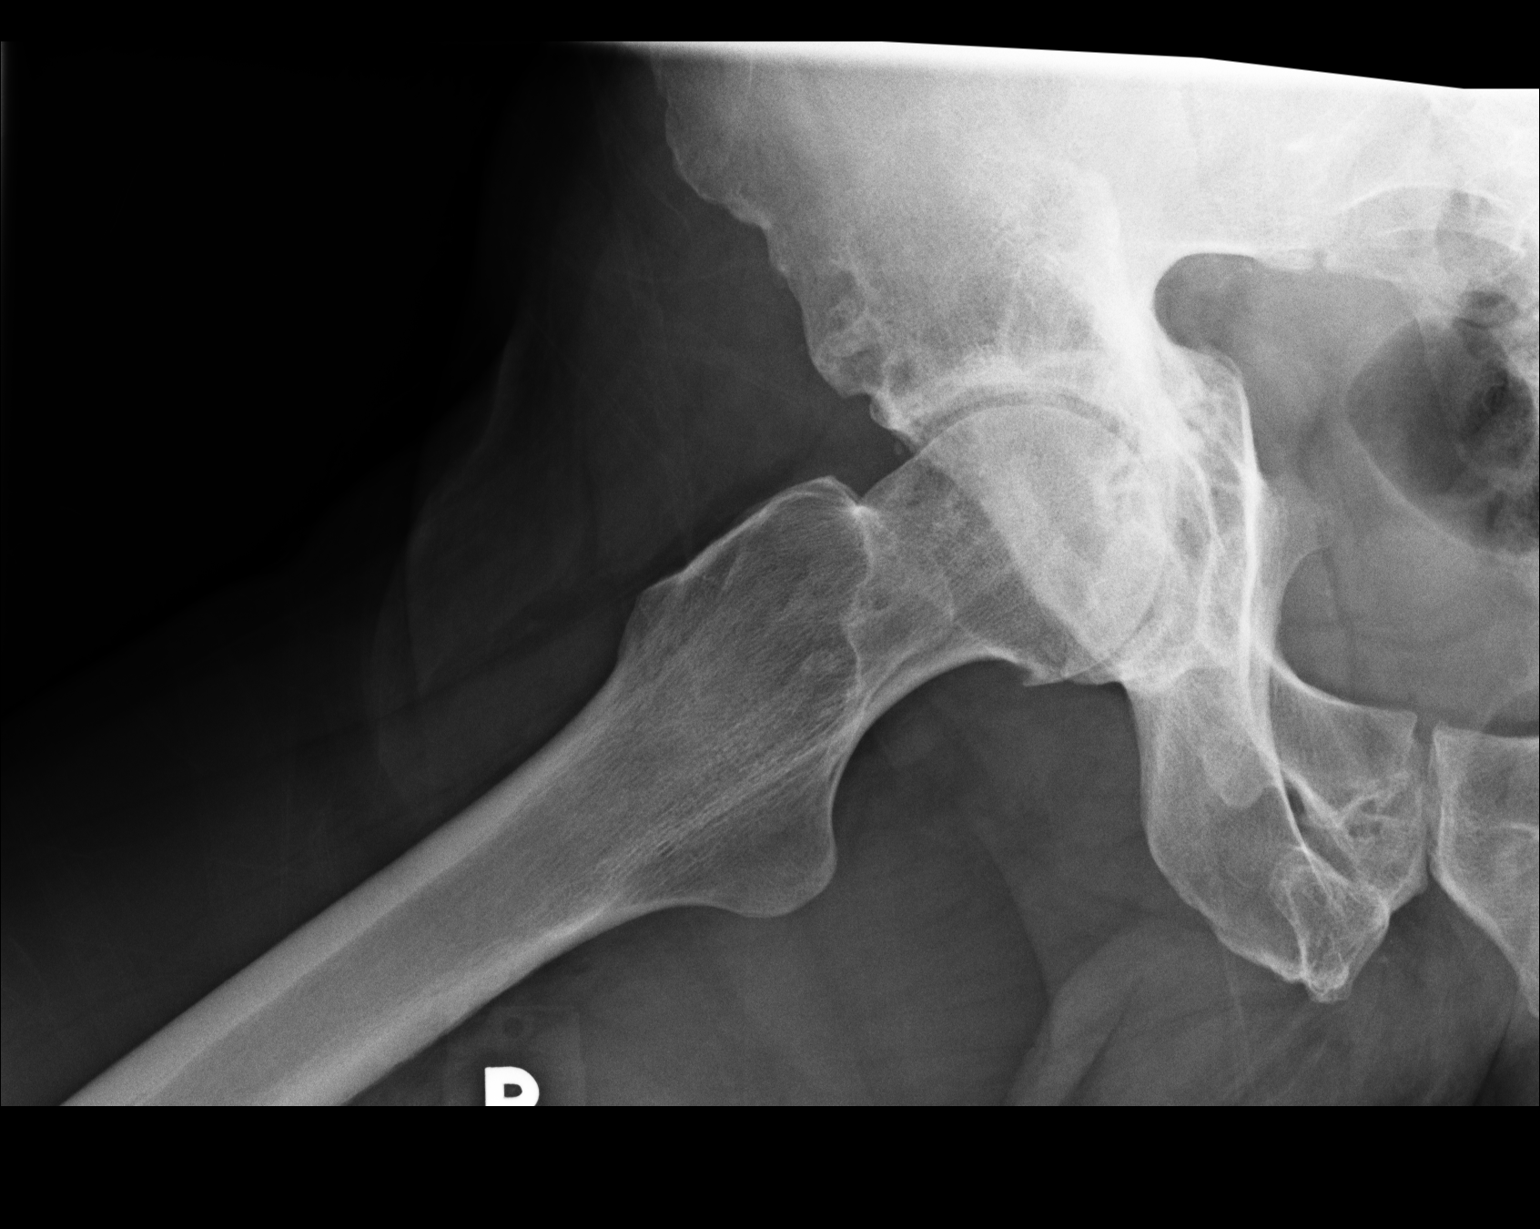

[2 of 2 positions shown; findings below may reference images not displayed]

PROCEDURE:     DXR - DXR HIP RIGHT COMPLETE  - [DATE]  [DATE]

RESULT:     No acute fracture or dislocation is seen. There is deformity of
the medial aspect of acetabulum compatible with residual change from prior
fracture. Additionally, there is an old fracture of the ischial ramus. The
hip joint space appears well maintained.
IMPRESSION: 1. No acute bony abnormalities about the hip are seen.
2. There is deformity of the acetabulum compatible with prior fracture. Also
noted is an old fracture of the RIGHT ischium.

## 2008-01-03 ENCOUNTER — Ambulatory Visit: Payer: Self-pay | Admitting: Internal Medicine

## 2008-01-03 IMAGING — US ABDOMEN ULTRASOUND
1 series · 14 of 25 positions shown · non-contrast
Comparison: none

REASON FOR EXAM: Abnormal bilirubin found in lab work
COMMENTS:

[Series 1: abdomen ultrasound · 0.35mm/px · 14 of 45 slices shown]
[im 1/45]
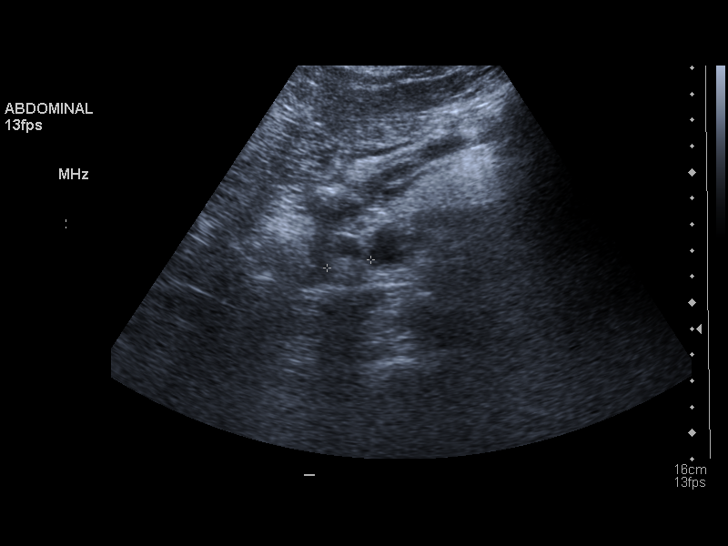
[im 4/45]
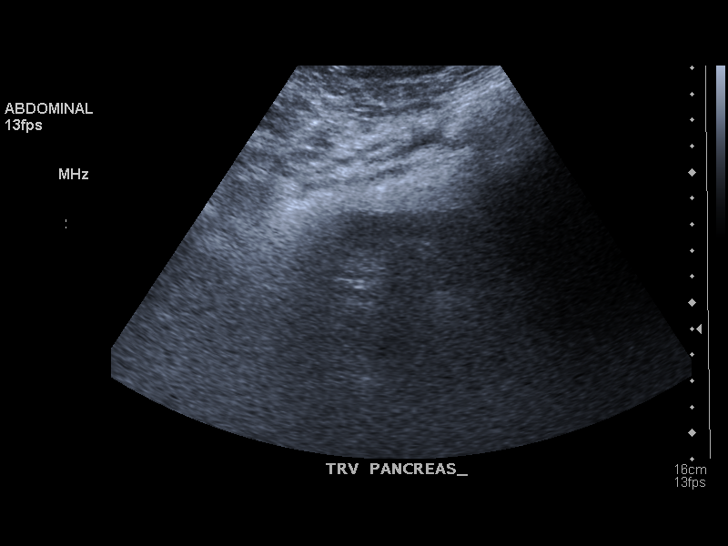
[im 8/45]
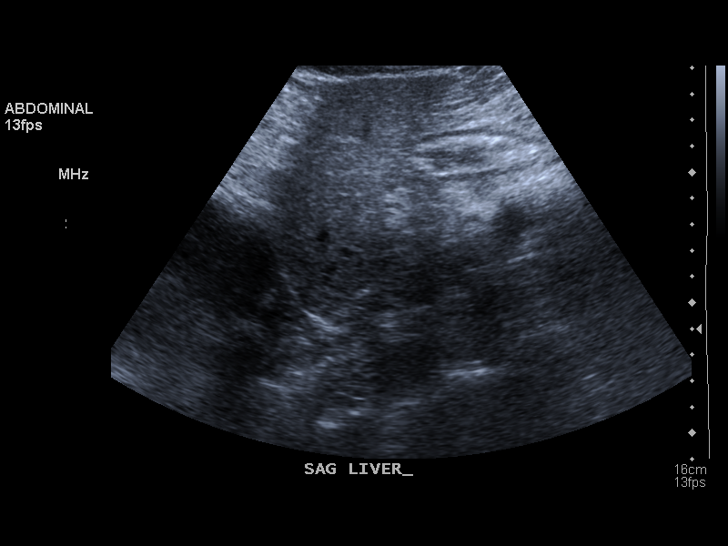
[im 12/45]
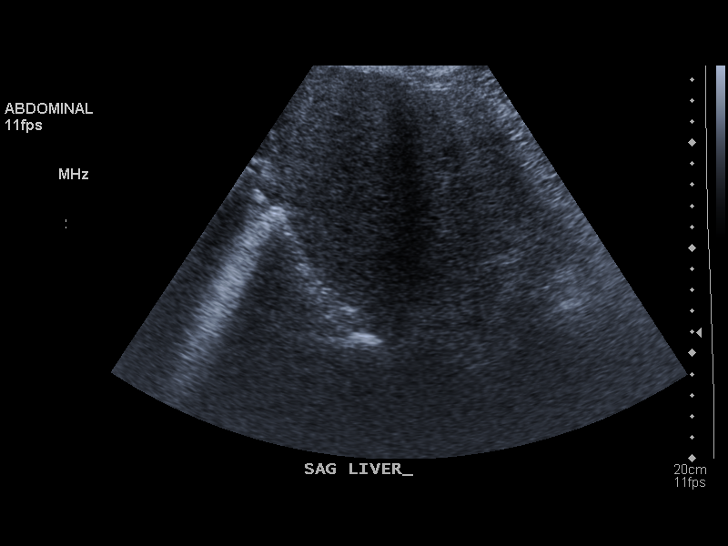
[im 15/45]
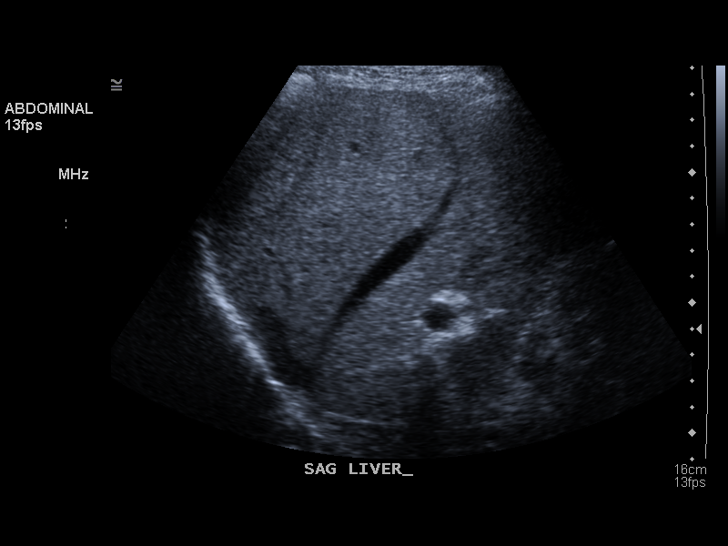
[im 17/45]
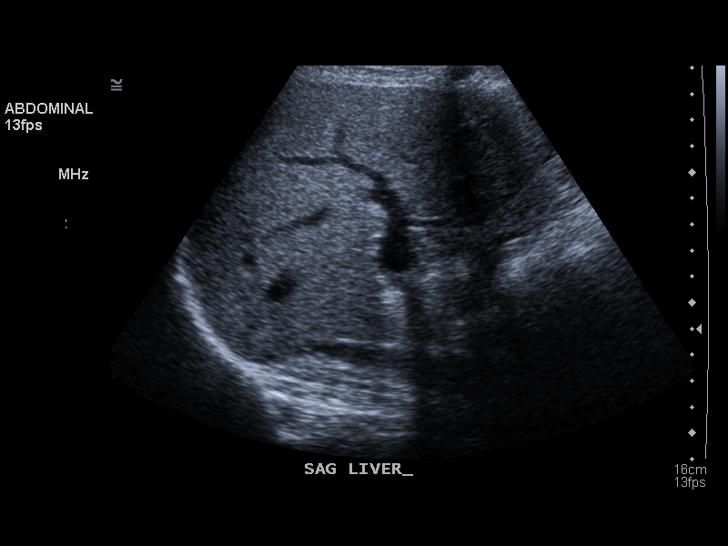
[im 21/45]
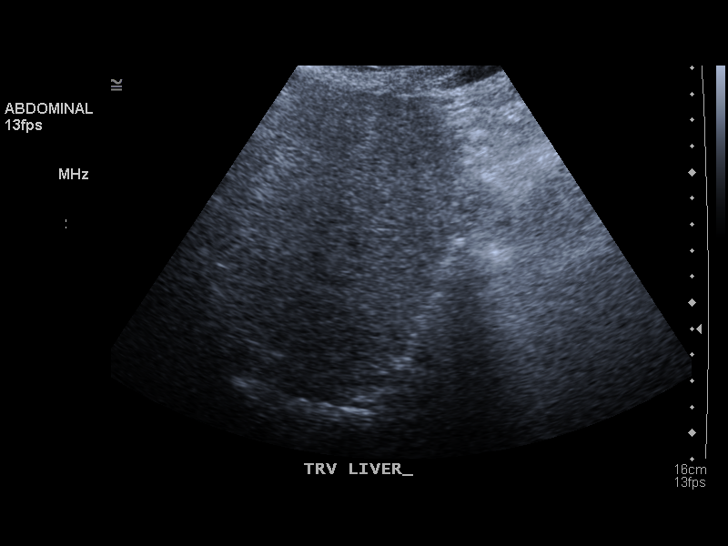
[im 24/45]
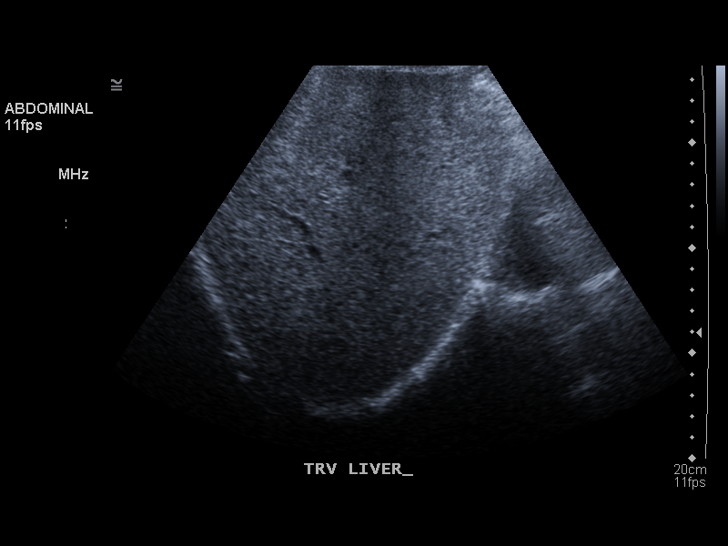
[im 28/45]
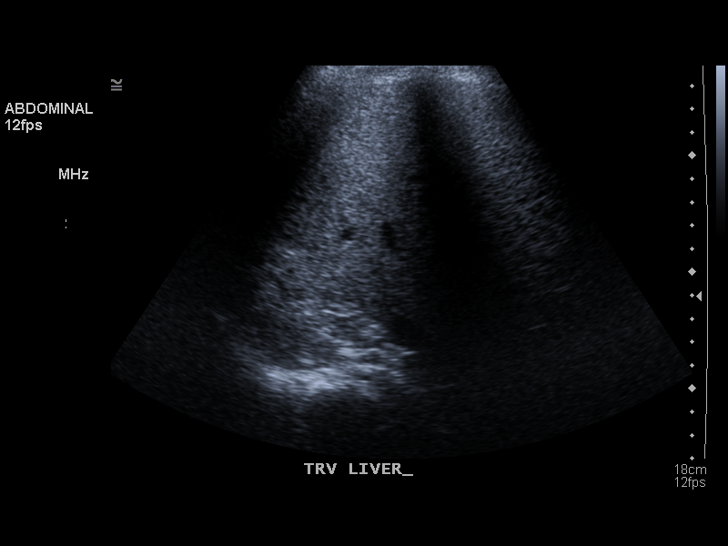
[im 30/45]
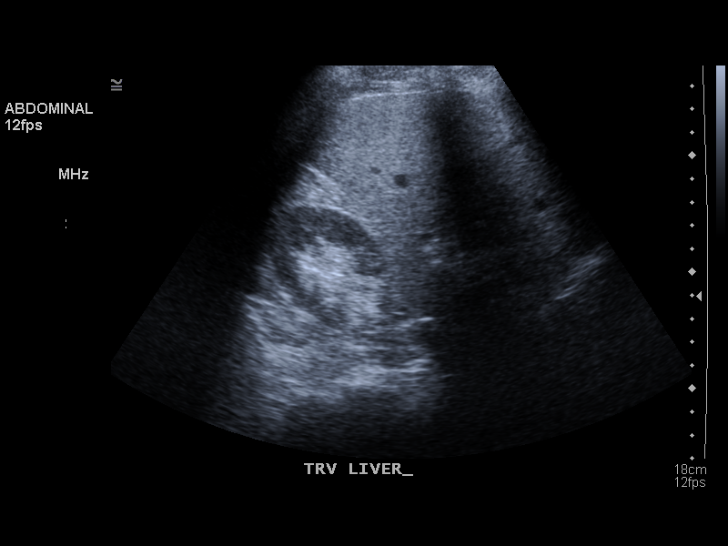
[im 34/45]
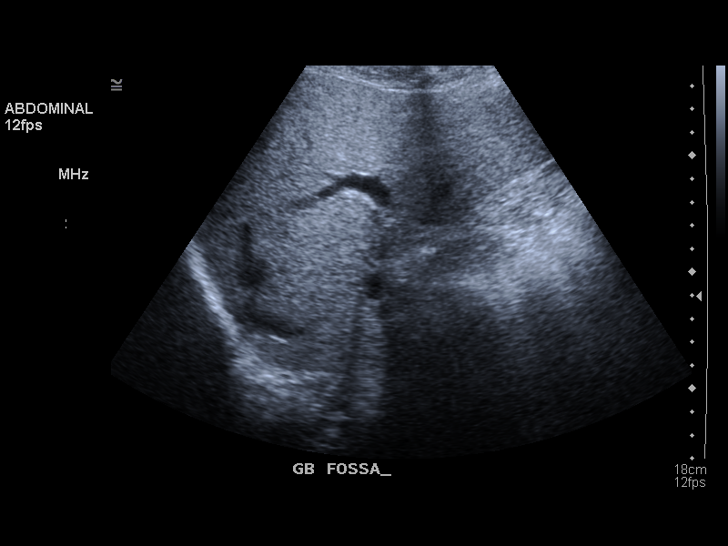
[im 37/45]
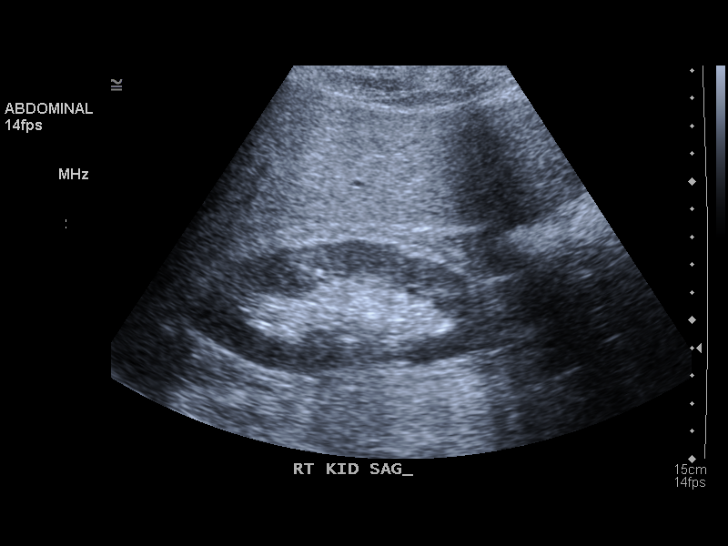
[im 41/45]
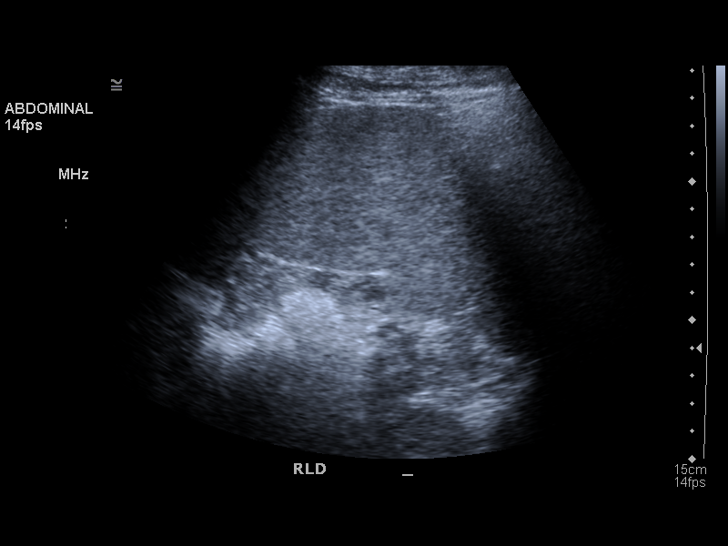
[im 45/45]
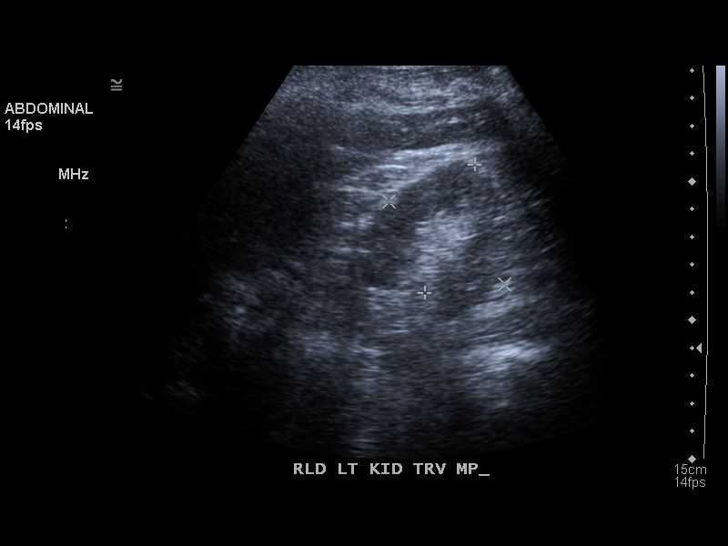

[14 of 25 positions shown; findings below may reference images not displayed]

PROCEDURE:     US  - US ABDOMEN GENERAL SURVEY  - [DATE]  [DATE]

RESULT:     The gallbladder is surgically absent. The liver exhibits normal
echo texture with no evidence of a mass nor of ductal dilation. Portal
venous flow is normal in direction toward the liver. The pancreas, spleen,
abdominal aorta, and kidneys are normal in appearance. The common bile duct
is normal at 5.3 mm in diameter. There is no evidence of ascites.
IMPRESSION: Normal abdominal ultrasound examination in the post
cholecystectomy patient.

## 2010-11-24 ENCOUNTER — Other Ambulatory Visit: Payer: Self-pay | Admitting: Internal Medicine

## 2013-01-30 ENCOUNTER — Ambulatory Visit: Payer: Self-pay | Admitting: Anesthesiology

## 2013-01-30 DIAGNOSIS — I10 Essential (primary) hypertension: Secondary | ICD-10-CM

## 2013-01-31 ENCOUNTER — Ambulatory Visit: Payer: Self-pay | Admitting: Podiatry

## 2013-03-21 ENCOUNTER — Ambulatory Visit: Payer: Self-pay | Admitting: Podiatry

## 2013-09-19 ENCOUNTER — Ambulatory Visit: Payer: Self-pay | Admitting: Podiatry

## 2013-09-19 ENCOUNTER — Emergency Department: Payer: Self-pay | Admitting: Emergency Medicine

## 2013-09-19 ENCOUNTER — Ambulatory Visit: Payer: Self-pay | Admitting: Anesthesiology

## 2013-09-19 DIAGNOSIS — I499 Cardiac arrhythmia, unspecified: Secondary | ICD-10-CM

## 2013-09-19 LAB — COMPREHENSIVE METABOLIC PANEL
Albumin: 3.7 g/dL (ref 3.4–5.0)
Alkaline Phosphatase: 66 U/L
Anion Gap: 2 — ABNORMAL LOW (ref 7–16)
Chloride: 101 mmol/L (ref 98–107)
EGFR (African American): >60
EGFR (Non-African Amer.): >60
Glucose: 179 mg/dL — ABNORMAL HIGH (ref 65–99)
Osmolality: 277 (ref 275–301)
Potassium: 5 mmol/L (ref 3.5–5.1)
SGOT(AST): 28 U/L (ref 15–37)
SGPT (ALT): 26 U/L (ref 12–78)
Sodium: 133 mmol/L — ABNORMAL LOW (ref 136–145)

## 2013-09-19 LAB — CBC
HCT: 46.8 % (ref 40.0–52.0)
MCH: 28.7 pg (ref 26.0–34.0)
MCV: 85 fL (ref 80–100)
Platelet: 174 10*3/uL (ref 150–440)
RDW: 13.4 % (ref 11.5–14.5)
WBC: 8.3 10*3/uL (ref 3.8–10.6)

## 2013-09-19 LAB — MAGNESIUM: Magnesium: 2.3 mg/dL

## 2013-09-19 LAB — CK TOTAL AND CKMB (NOT AT ARMC): CK-MB: 5 ng/mL — ABNORMAL HIGH (ref 0.5–3.6)

## 2013-09-19 LAB — TSH: Thyroid Stimulating Horm: 1.65 u[IU]/mL

## 2013-12-17 ENCOUNTER — Ambulatory Visit: Payer: Self-pay | Admitting: Podiatry

## 2013-12-17 LAB — BASIC METABOLIC PANEL
ANION GAP: 2 — AB (ref 7–16)
BUN: 29 mg/dL — AB (ref 7–18)
CHLORIDE: 103 mmol/L (ref 98–107)
CREATININE: 1.6 mg/dL — AB (ref 0.60–1.30)
Calcium, Total: 8.4 mg/dL — ABNORMAL LOW (ref 8.5–10.1)
Co2: 30 mmol/L (ref 21–32)
EGFR (Non-African Amer.): 46 — ABNORMAL LOW
GFR CALC AF AMER: 53 — AB
Glucose: 162 mg/dL — ABNORMAL HIGH (ref 65–99)
OSMOLALITY: 279 (ref 275–301)
Potassium: 4.7 mmol/L (ref 3.5–5.1)
Sodium: 135 mmol/L — ABNORMAL LOW (ref 136–145)

## 2013-12-17 LAB — CBC WITH DIFFERENTIAL/PLATELET
Basophil #: 0 10*3/uL (ref 0.0–0.1)
Basophil %: 0.6 %
EOS ABS: 0.1 10*3/uL (ref 0.0–0.7)
Eosinophil %: 2.8 %
HCT: 38.3 % — ABNORMAL LOW (ref 40.0–52.0)
HGB: 13.1 g/dL (ref 13.0–18.0)
Lymphocyte #: 1.1 10*3/uL (ref 1.0–3.6)
Lymphocyte %: 20.5 %
MCH: 29.4 pg (ref 26.0–34.0)
MCHC: 34.4 g/dL (ref 32.0–36.0)
MCV: 86 fL (ref 80–100)
MONO ABS: 0.5 x10 3/mm (ref 0.2–1.0)
Monocyte %: 9.4 %
Neutrophil #: 3.6 10*3/uL (ref 1.4–6.5)
Neutrophil %: 66.7 %
Platelet: 149 10*3/uL — ABNORMAL LOW (ref 150–440)
RBC: 4.48 10*6/uL (ref 4.40–5.90)
RDW: 13.8 % (ref 11.5–14.5)
WBC: 5.4 10*3/uL (ref 3.8–10.6)

## 2013-12-21 ENCOUNTER — Ambulatory Visit: Payer: Self-pay | Admitting: Podiatry

## 2013-12-27 LAB — PATHOLOGY REPORT

## 2014-04-17 ENCOUNTER — Other Ambulatory Visit: Payer: Self-pay | Admitting: Physician Assistant

## 2014-04-17 LAB — COMPREHENSIVE METABOLIC PANEL
ALK PHOS: 50 U/L
ALT: 36 U/L
ANION GAP: 5 — AB (ref 7–16)
Albumin: 3.8 g/dL (ref 3.4–5.0)
BUN: 28 mg/dL — AB (ref 7–18)
Bilirubin,Total: 1.5 mg/dL — ABNORMAL HIGH (ref 0.2–1.0)
Calcium, Total: 8.6 mg/dL (ref 8.5–10.1)
Chloride: 103 mmol/L (ref 98–107)
Co2: 28 mmol/L (ref 21–32)
Creatinine: 1.3 mg/dL (ref 0.60–1.30)
EGFR (African American): >60
GFR CALC NON AF AMER: 59 — AB
GLUCOSE: 124 mg/dL — AB (ref 65–99)
Osmolality: 279 (ref 275–301)
POTASSIUM: 5.1 mmol/L (ref 3.5–5.1)
SGOT(AST): 26 U/L (ref 15–37)
Sodium: 136 mmol/L (ref 136–145)
TOTAL PROTEIN: 7.1 g/dL (ref 6.4–8.2)

## 2014-04-17 LAB — LIPID PANEL
CHOLESTEROL: 125 mg/dL (ref 0–200)
HDL Cholesterol: 25 mg/dL — ABNORMAL LOW (ref 40–60)
LDL CHOLESTEROL, CALC: 62 mg/dL (ref 0–100)
Triglycerides: 190 mg/dL (ref 0–200)
VLDL Cholesterol, Calc: 38 mg/dL (ref 5–40)

## 2014-04-17 LAB — CBC WITH DIFFERENTIAL/PLATELET
BASOS PCT: 1.5 %
Basophil #: 0.1 10*3/uL (ref 0.0–0.1)
EOS PCT: 4.1 %
Eosinophil #: 0.2 10*3/uL (ref 0.0–0.7)
HCT: 44.6 % (ref 40.0–52.0)
HGB: 15.2 g/dL (ref 13.0–18.0)
Lymphocyte #: 1.3 10*3/uL (ref 1.0–3.6)
Lymphocyte %: 30 %
MCH: 30.6 pg (ref 26.0–34.0)
MCHC: 34 g/dL (ref 32.0–36.0)
MCV: 90 fL (ref 80–100)
Monocyte #: 0.4 x10 3/mm (ref 0.2–1.0)
Monocyte %: 8.4 %
NEUTROS ABS: 2.5 10*3/uL (ref 1.4–6.5)
Neutrophil %: 56 %
Platelet: 127 10*3/uL — ABNORMAL LOW (ref 150–440)
RBC: 4.95 10*6/uL (ref 4.40–5.90)
RDW: 13.3 % (ref 11.5–14.5)
WBC: 4.5 10*3/uL (ref 3.8–10.6)

## 2014-04-19 LAB — PSA: PSA: 0.3 ng/mL (ref 0.0–4.0)

## 2014-06-13 DIAGNOSIS — E782 Mixed hyperlipidemia: Secondary | ICD-10-CM | POA: Insufficient documentation

## 2014-06-13 DIAGNOSIS — I471 Supraventricular tachycardia: Secondary | ICD-10-CM | POA: Diagnosis present

## 2014-06-13 DIAGNOSIS — E119 Type 2 diabetes mellitus without complications: Secondary | ICD-10-CM | POA: Insufficient documentation

## 2014-06-13 DIAGNOSIS — E785 Hyperlipidemia, unspecified: Secondary | ICD-10-CM | POA: Insufficient documentation

## 2014-06-13 DIAGNOSIS — I10 Essential (primary) hypertension: Secondary | ICD-10-CM | POA: Insufficient documentation

## 2014-06-13 DIAGNOSIS — Z889 Allergy status to unspecified drugs, medicaments and biological substances status: Secondary | ICD-10-CM | POA: Insufficient documentation

## 2014-06-13 DIAGNOSIS — M1990 Unspecified osteoarthritis, unspecified site: Secondary | ICD-10-CM | POA: Insufficient documentation

## 2014-12-04 DIAGNOSIS — R079 Chest pain, unspecified: Secondary | ICD-10-CM | POA: Insufficient documentation

## 2015-01-18 NOTE — Op Note (Signed)
PATIENT NAMEOPIE, [name] MR#:  [id number] DATE OF BIRTH:  06-Feb-1951  DATE OF PROCEDURE:  12/21/2013  PREOPERATIVE DIAGNOSES:  1.  Right lower extremity equinus.  2.  Ulcer right plantar first metatarsophalangeal joint.   POSTOPERATIVE DIAGNOSES: 1.  Right lower extremity equinus.  2.  Ulcer right plantar first metatarsophalangeal joint.   PROCEDURE PERFORMED:  1.  Percutaneous tendo-Achilles lengthening, right lower extremity.  2.  Jake Michaelis arthroplasty, right first metatarsophalangeal joint.   SURGEON: Jashay Roddy A. Vickki Muff, DPM.   ANESTHESIA: General.   COMPLICATIONS: None.   SPECIMEN: None.   HEMOSTASIS: Epinephrine 1:200,000 infiltrated along the incision sites.   OPERATIVE INDICATIONS: This is a 64 year old gentleman, who has had a long-standing ulcer under his right great toe joint region. We have undergone long-standing conservative treatment and he presents today for surgery. All risks, benefits, alternatives, and complications associated with surgery were discussed with the patient and informed consent has been given.   OPERATIVE PROCEDURE: The patient was brought into the OR and placed on the operating table in the supine position. General intubation was administered by the anesthesia team. The right lower extremity was then prepped and draped in the usual sterile fashion. Attention was directed to the posterior aspect of the Achilles where at 1, 3 and 5 cm proximal to the insertion, 3 hemi-sections were performed. Good lengthening of this was noted and I was able to close these small percutaneous incisions up with a 3-0 nylon. Attention was then directed to the medial first MTPJ where a medial incision was made. Sharp and blunt dissection was carried down to the capsule. A longitudinal capsulotomy was performed. At this time, the dorsal and plantar aspect of the MTPJ was exposed. At this time, the base of the proximal phalanx of the great toe was excised to allow further motion of  the MTPJ. The fibular sesamoid had moved over to the plantar aspect of the metatarsal and was directly beneath the ulcerative area. I elected to remove the fibular sesamoid as the tibial sesamoid had previously been removed. Much less prominence was noted under this region with palpation at this time. This wound was then flushed with copious amounts of irrigation. There was screw at the base of the proximal phalanx that I was able to remove prior to performing the [facility] arthroplasty portion.   At this time, a much better excursion of the MTPJ and flexibility of the joint was noted. I felt this would relieve a large amount of the pressure into the region. The wound was then flushed with copious amounts of irrigation. Layered closure was performed with a 2-0 Vicryl for the capsular layer. A small capsulorrhaphy was performed medially. 2-0 Vicryl for the subcutaneous tissue and 4-0 Vicryl for the subcuticular region and 3-0 nylon for the skin was placed. The MTPJ was pinned with a 0.062 K wire to realign the joint itself. Overall, the patient tolerated the procedure and anesthesia well and was transported from the OR to the PACU with all vital signs stable and neurovascular status intact. I will see him in the outpatient clinic in 5 to 7 days. He was placed in his equalizer walker boot. A pin cap was applied and will remove this within the next few weeks.   ____________________________ [name] Vickki Muff, DPM jaf:aw D: 12/21/2013 56:21:30 ET T: 12/21/2013 13:16:15 ET JOB#: [id number]  cc: Larkin Ina A. Vickki Muff, DPM, <Dictator> Marketta Valadez DPM ELECTRONICALLY SIGNED 01/03/2014 9:12

## 2015-07-10 ENCOUNTER — Inpatient Hospital Stay
Admission: EM | Admit: 2015-07-10 | Discharge: 2015-07-12 | DRG: 309 | Disposition: A | Discharge status: Home / Self Care | Payer: 59 | Attending: Internal Medicine | Admitting: Internal Medicine

## 2015-07-10 ENCOUNTER — Inpatient Hospital Stay
Admit: 2015-07-10 | Discharge: 2015-07-10 | Disposition: A | Discharge status: Home / Self Care | Payer: 59 | Attending: Internal Medicine | Admitting: Internal Medicine

## 2015-07-10 ENCOUNTER — Emergency Department: Payer: 59

## 2015-07-10 ENCOUNTER — Encounter: Payer: Self-pay | Admitting: Emergency Medicine

## 2015-07-10 DIAGNOSIS — R748 Abnormal levels of other serum enzymes: Secondary | ICD-10-CM | POA: Diagnosis present

## 2015-07-10 DIAGNOSIS — Z9049 Acquired absence of other specified parts of digestive tract: Secondary | ICD-10-CM | POA: Diagnosis not present

## 2015-07-10 DIAGNOSIS — I129 Hypertensive chronic kidney disease with stage 1 through stage 4 chronic kidney disease, or unspecified chronic kidney disease: Secondary | ICD-10-CM | POA: Diagnosis present

## 2015-07-10 DIAGNOSIS — Z7982 Long term (current) use of aspirin: Secondary | ICD-10-CM

## 2015-07-10 DIAGNOSIS — E1122 Type 2 diabetes mellitus with diabetic chronic kidney disease: Secondary | ICD-10-CM | POA: Diagnosis present

## 2015-07-10 DIAGNOSIS — E875 Hyperkalemia: Secondary | ICD-10-CM | POA: Diagnosis present

## 2015-07-10 DIAGNOSIS — R42 Dizziness and giddiness: Secondary | ICD-10-CM | POA: Diagnosis present

## 2015-07-10 DIAGNOSIS — E785 Hyperlipidemia, unspecified: Secondary | ICD-10-CM | POA: Diagnosis present

## 2015-07-10 DIAGNOSIS — N183 Chronic kidney disease, stage 3 (moderate): Secondary | ICD-10-CM | POA: Diagnosis present

## 2015-07-10 DIAGNOSIS — I471 Supraventricular tachycardia: Secondary | ICD-10-CM | POA: Diagnosis present

## 2015-07-10 DIAGNOSIS — N179 Acute kidney failure, unspecified: Secondary | ICD-10-CM | POA: Diagnosis present

## 2015-07-10 DIAGNOSIS — R109 Unspecified abdominal pain: Secondary | ICD-10-CM

## 2015-07-10 DIAGNOSIS — Z6826 Body mass index (BMI) 26.0-26.9, adult: Secondary | ICD-10-CM | POA: Diagnosis not present

## 2015-07-10 DIAGNOSIS — Z79899 Other long term (current) drug therapy: Secondary | ICD-10-CM

## 2015-07-10 DIAGNOSIS — E669 Obesity, unspecified: Secondary | ICD-10-CM | POA: Diagnosis present

## 2015-07-10 DIAGNOSIS — I959 Hypotension, unspecified: Secondary | ICD-10-CM | POA: Diagnosis present

## 2015-07-10 DIAGNOSIS — Z87891 Personal history of nicotine dependence: Secondary | ICD-10-CM

## 2015-07-10 HISTORY — DX: Type 2 diabetes mellitus without complications: E11.9

## 2015-07-10 HISTORY — DX: Essential (primary) hypertension: I10

## 2015-07-10 LAB — COMPREHENSIVE METABOLIC PANEL
ALBUMIN: 3.9 g/dL (ref 3.5–5.0)
ALT: 19 U/L (ref 17–63)
AST: 27 U/L (ref 15–41)
Alkaline Phosphatase: 58 U/L (ref 38–126)
Anion gap: 9 (ref 5–15)
BILIRUBIN TOTAL: 2.1 mg/dL — AB (ref 0.3–1.2)
BUN: 47 mg/dL — AB (ref 6–20)
CHLORIDE: 96 mmol/L — AB (ref 101–111)
CO2: 26 mmol/L (ref 22–32)
Calcium: 9.1 mg/dL (ref 8.9–10.3)
Creatinine, Ser: 2.2 mg/dL — ABNORMAL HIGH (ref 0.61–1.24)
GFR calc Af Amer: 35 mL/min — ABNORMAL LOW (ref >60)
GFR calc non Af Amer: 31 mL/min — ABNORMAL LOW (ref >60)
GLUCOSE: 249 mg/dL — AB (ref 65–99)
POTASSIUM: 5.1 mmol/L (ref 3.5–5.1)
Sodium: 131 mmol/L — ABNORMAL LOW (ref 135–145)
TOTAL PROTEIN: 7.4 g/dL (ref 6.5–8.1)

## 2015-07-10 LAB — TROPONIN I
TROPONIN I: 0.03 ng/mL (ref <0.031)
Troponin I: <0.03 ng/mL (ref <0.031)

## 2015-07-10 LAB — CREATININE, SERUM
CREATININE: 1.65 mg/dL — AB (ref 0.61–1.24)
GFR calc Af Amer: 50 mL/min — ABNORMAL LOW (ref >60)
GFR calc non Af Amer: 43 mL/min — ABNORMAL LOW (ref >60)

## 2015-07-10 LAB — CBC
HCT: 44.3 % (ref 40.0–52.0)
HEMATOCRIT: 48.6 % (ref 40.0–52.0)
HEMOGLOBIN: 15.2 g/dL (ref 13.0–18.0)
Hemoglobin: 16.5 g/dL (ref 13.0–18.0)
MCH: 30.1 pg (ref 26.0–34.0)
MCH: 30.4 pg (ref 26.0–34.0)
MCHC: 33.9 g/dL (ref 32.0–36.0)
MCHC: 34.4 g/dL (ref 32.0–36.0)
MCV: 88.6 fL (ref 80.0–100.0)
MCV: 88.3 fL (ref 80.0–100.0)
PLATELETS: 168 10*3/uL (ref 150–440)
Platelets: 187 10*3/uL (ref 150–440)
RBC: 5.48 MIL/uL (ref 4.40–5.90)
RBC: 5.01 MIL/uL (ref 4.40–5.90)
RDW: 13.4 % (ref 11.5–14.5)
RDW: 13 % (ref 11.5–14.5)
WBC: 9.8 10*3/uL (ref 3.8–10.6)
WBC: 6.8 10*3/uL (ref 3.8–10.6)

## 2015-07-10 LAB — LIPASE, BLOOD: Lipase: 117 U/L — ABNORMAL HIGH (ref 22–51)

## 2015-07-10 LAB — TSH: TSH: 3.796 u[IU]/mL (ref 0.350–4.500)

## 2015-07-10 LAB — GLUCOSE, CAPILLARY
GLUCOSE-CAPILLARY: 116 mg/dL — AB (ref 65–99)
GLUCOSE-CAPILLARY: 157 mg/dL — AB (ref 65–99)

## 2015-07-10 IMAGING — CT CT ANGIO CHEST
2 of 6 series · 17 of 36 positions shown · IV contrast (APPLIED)
Comparison: CT abdomen [DATE]

CLINICAL DATA: RIGHT-sided abdominal pain which initiated 2 days
prior. Patient tachycardic at hypertensive. Scan perirenal cold with
such.

EXAM:
CT ANGIOGRAPHY CHEST, ABDOMEN AND PELVIS
TECHNIQUE: Multidetector CT imaging through the chest, abdomen and pelvis was
performed using the standard protocol during bolus administration of
intravenous contrast. Multiplanar reconstructed images and MIPs were
obtained and reviewed to evaluate the vascular anatomy.
CONTRAST:  80mL OMNIPAQUE IOHEXOL 350 MG/ML SOLN. Reduced dose for
renal insufficiency.

[Series 6: arterial · axial · arterial · 0.72mm/px · z∈[-1002,-400]mm · 16 of 333 slices shown]
[im 16/333  lung]
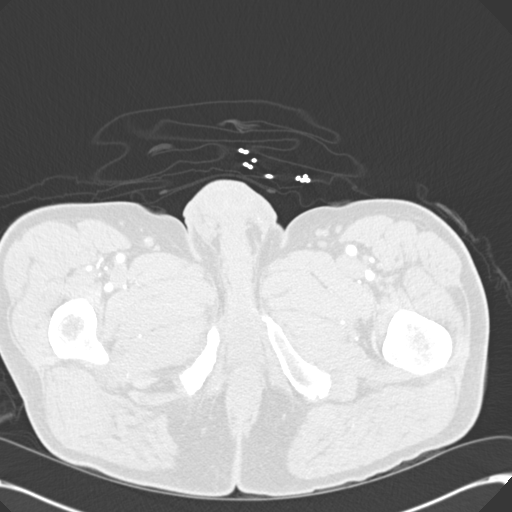
[im 31/333  mediastinal]
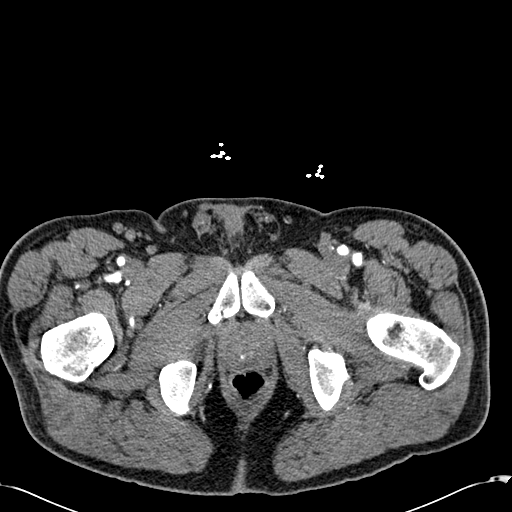
[im 61/333  lung]
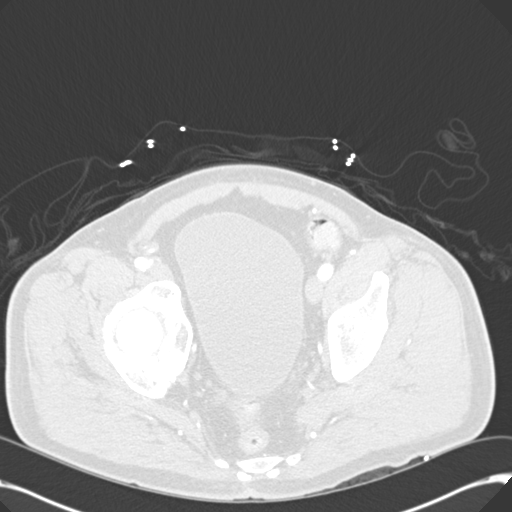
[im 76/333  mediastinal]
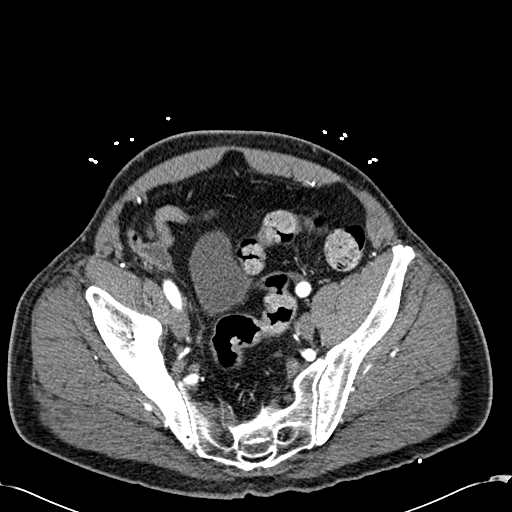
[im 91/333  lung]
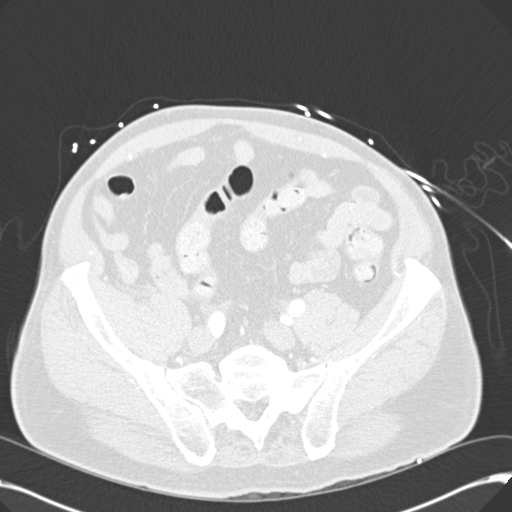
[im 121/333  mediastinal]
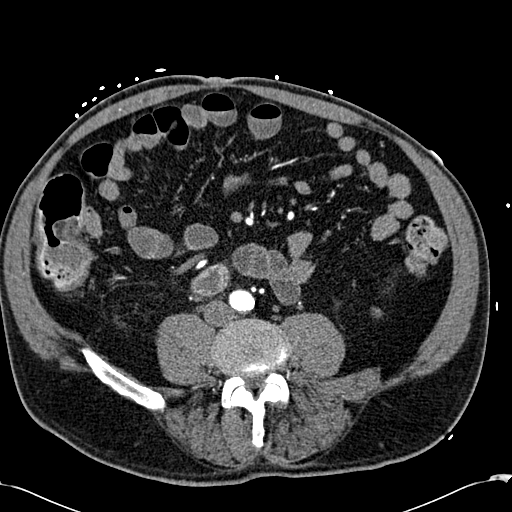
[im 136/333  lung]
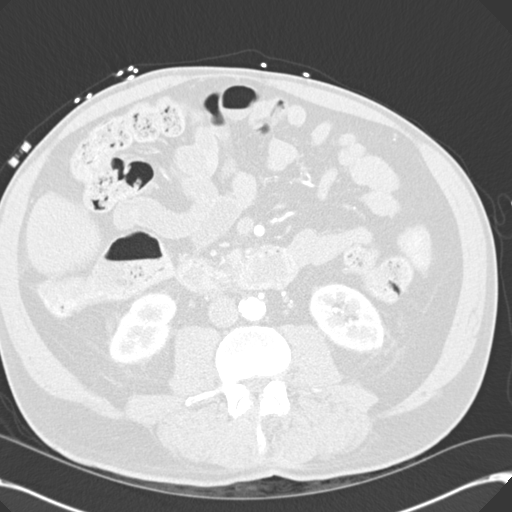
[im 151/333  mediastinal]
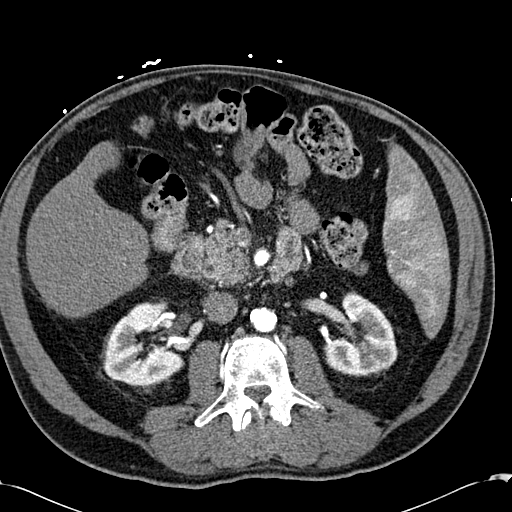
[im 182/333  lung]
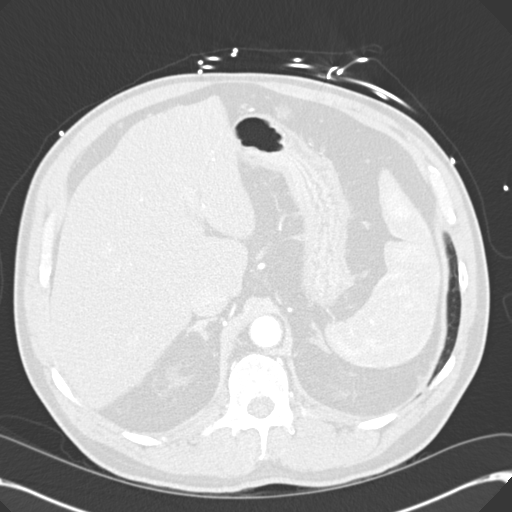
[im 197/333  mediastinal]
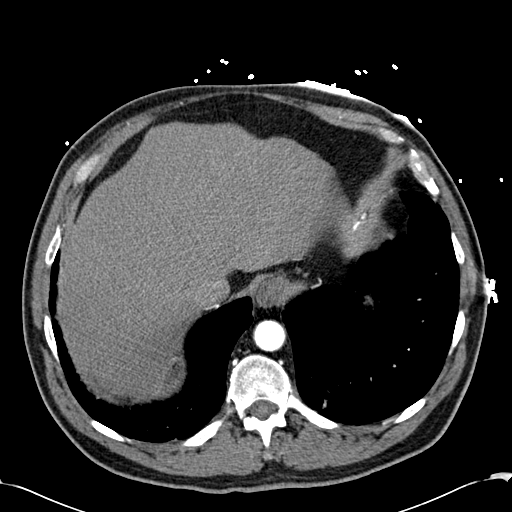
[im 212/333  lung]
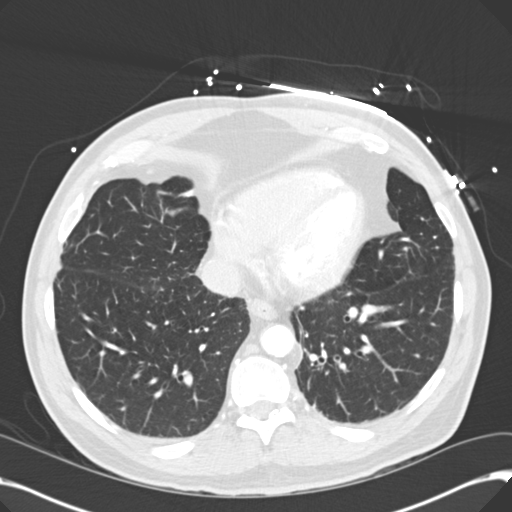
[im 242/333  mediastinal]
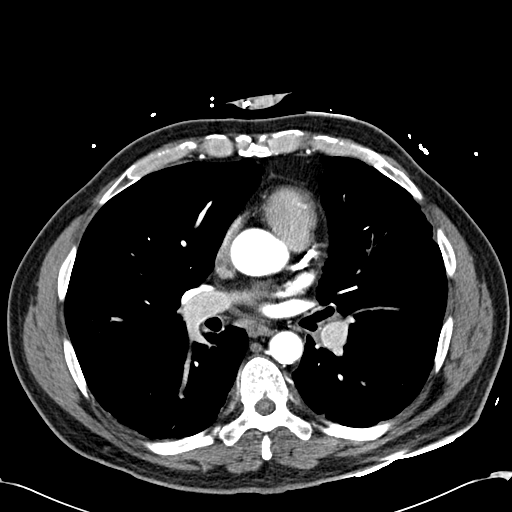
[im 257/333  lung]
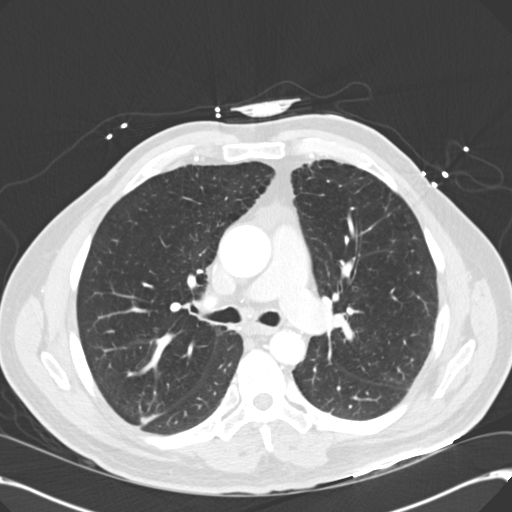
[im 272/333  mediastinal]
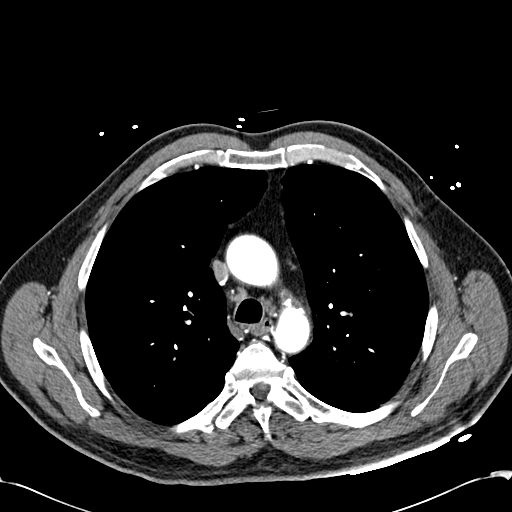
[im 302/333  lung]
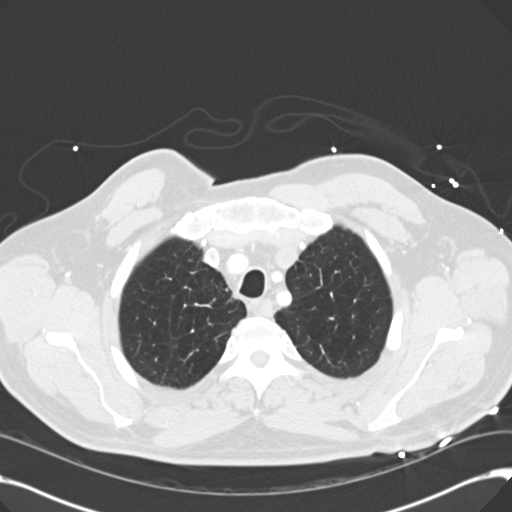
[im 317/333  mediastinal]
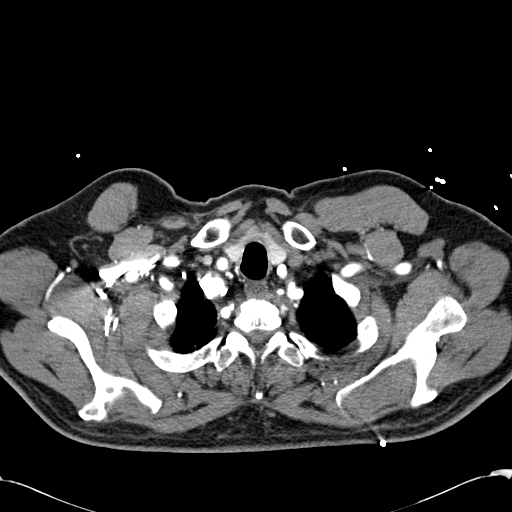

[Series 8: cor arterial mpr · coronal · arterial · 0.89mm/px · 1 of 162 slices shown]
[im 81/162  mediastinal]
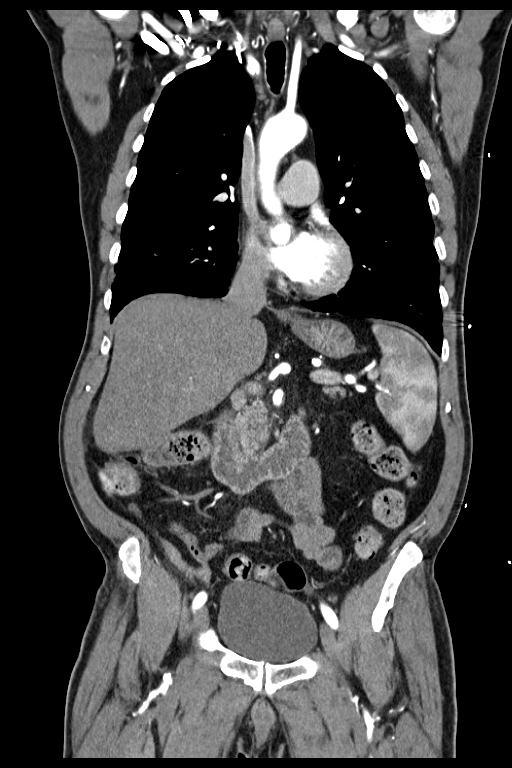

[17 of 36 positions shown; findings below may reference images not displayed]

FINDINGS: CTA CHEST FINDINGS

Mediastinum/Nodes: Non IV contrast images demonstrate no intramural
hematoma within aorta. There is intimal calcifications along the
arch and origin of great vessels. Contrast enhanced images
demonstrate no evidence of dissection or aneurysm of the aorta or
great vessels. Mixed soft and calcified plaque is noted. Exam was
not timed to evaluate the pulmonary arteries.

No axillary or supraclavicular lymphadenopathy. No mediastinal hilar
adenopathy. Esophagus. No pericardial fluid.

Lungs/Pleura: Centrilobular emphysema with upper lobe predominance.
No pneumothorax, pleural fluid or airspace disease.

Review of the MIP images confirms the above findings.

CTA ABDOMEN AND PELVIS FINDINGS

Hepatobiliary: No focal hepatic lesion. No biliary duct dilatation.
Gallbladder is normal. Common bile duct is normal.

Pancreas: Pancreas is normal. No ductal dilatation. No pancreatic
inflammation.

Spleen: Normal spleen

Adrenals/urinary tract: Adrenal glands and kidneys are normal. The
ureters and bladder normal.

Stomach/Bowel: Stomach, small-bowel, appendix, cecum normal. Colon
and rectosigmoid colon are normal.

Vascular/Lymphatic: The aorta is normal caliber without evidence of
dissection or aneurysm. There is intimal calcification at the ostia
of the SMA and celiac trunk. Calcifications at the bilateral single
renal arteries. The IMA is patent. No aneurysm of the iliac
arteries.

Reproductive: Prostate normal.  LEFT hydrocele.

Musculoskeletal: No aggressive osseous lesion.

Other: No free fluid.

Review of the MIP images confirms the above findings.
IMPRESSION: Chest Impression:

1. No evidence of aortic dissection or aneurysm.
2. Atherosclerotic calcification aorta
3. No acute pulmonary parenchymal findings.
4. Centrilobular emphysema.

Abdomen / Pelvis Impression:

1. No evidence of dissection or aneurysm the abdominal aorta.
Atherosclerotic calcification noted.

2.  No acute findings in the abdomen pelvis.  Appendix normal.

## 2015-07-10 MED ORDER — GABAPENTIN 100 MG PO CAPS
100.0000 mg | ORAL_CAPSULE | Freq: Every day | ORAL | Status: DC
  Administered 2015-07-10 – 2015-07-11 (×2): 100 mg via ORAL
  Filled 2015-07-10 (×2): qty 1

## 2015-07-10 MED ORDER — SODIUM CHLORIDE 0.9 % IJ SOLN
3.0000 mL | Freq: Two times a day (BID) | INTRAMUSCULAR | Status: DC
  Administered 2015-07-10 – 2015-07-12 (×4): 3 mL via INTRAVENOUS

## 2015-07-10 MED ORDER — TRAMADOL HCL 50 MG PO TABS
50.0000 mg | ORAL_TABLET | Freq: Two times a day (BID) | ORAL | Status: DC | PRN
  Administered 2015-07-11 (×2): 50 mg via ORAL
  Filled 2015-07-10 (×2): qty 1

## 2015-07-10 MED ORDER — ONDANSETRON HCL 4 MG/2ML IJ SOLN
4.0000 mg | Freq: Once | INTRAMUSCULAR | Status: AC
  Administered 2015-07-10: 4 mg via INTRAVENOUS
  Filled 2015-07-10: qty 2

## 2015-07-10 MED ORDER — ASPIRIN EC 81 MG PO TBEC
81.0000 mg | DELAYED_RELEASE_TABLET | Freq: Every day | ORAL | Status: DC
  Administered 2015-07-11 – 2015-07-12 (×2): 81 mg via ORAL
  Filled 2015-07-10 (×2): qty 1

## 2015-07-10 MED ORDER — HEPARIN SODIUM (PORCINE) 5000 UNIT/ML IJ SOLN
5000.0000 [IU] | Freq: Three times a day (TID) | INTRAMUSCULAR | Status: DC
  Administered 2015-07-10 – 2015-07-12 (×6): 5000 [IU] via SUBCUTANEOUS
  Filled 2015-07-10 (×6): qty 1

## 2015-07-10 MED ORDER — ADENOSINE 12 MG/4ML IV SOLN
INTRAVENOUS | Status: AC
  Filled 2015-07-10: qty 4

## 2015-07-10 MED ORDER — ADENOSINE 6 MG/2ML IV SOLN
INTRAVENOUS | Status: AC
  Administered 2015-07-10: 6 mg via INTRAVENOUS
  Filled 2015-07-10: qty 2

## 2015-07-10 MED ORDER — INSULIN ASPART 100 UNIT/ML ~~LOC~~ SOLN
0.0000 [IU] | Freq: Three times a day (TID) | SUBCUTANEOUS | Status: DC
  Administered 2015-07-11: 2 [IU] via SUBCUTANEOUS
  Administered 2015-07-11: 1 [IU] via SUBCUTANEOUS
  Administered 2015-07-11: 2 [IU] via SUBCUTANEOUS
  Administered 2015-07-12: 1 [IU] via SUBCUTANEOUS
  Filled 2015-07-10: qty 2
  Filled 2015-07-10: qty 1
  Filled 2015-07-10: qty 2
  Filled 2015-07-10: qty 1

## 2015-07-10 MED ORDER — METOPROLOL SUCCINATE ER 100 MG PO TB24
100.0000 mg | ORAL_TABLET | Freq: Every day | ORAL | Status: DC
  Administered 2015-07-11 – 2015-07-12 (×2): 100 mg via ORAL
  Filled 2015-07-10 (×2): qty 1

## 2015-07-10 MED ORDER — SODIUM CHLORIDE 0.9 % IV BOLUS (SEPSIS)
1000.0000 mL | Freq: Once | INTRAVENOUS | Status: AC
  Administered 2015-07-10: 1000 mL via INTRAVENOUS

## 2015-07-10 MED ORDER — INSULIN ASPART 100 UNIT/ML ~~LOC~~ SOLN: 0.0000 [IU] | Freq: Every day | SUBCUTANEOUS | Status: DC

## 2015-07-10 MED ORDER — FENTANYL CITRATE (PF) 100 MCG/2ML IJ SOLN
25.0000 ug | Freq: Once | INTRAMUSCULAR | Status: AC
  Administered 2015-07-10: 25 ug via INTRAVENOUS
  Filled 2015-07-10: qty 2

## 2015-07-10 MED ORDER — ADENOSINE 6 MG/2ML IV SOLN
6.0000 mg | Freq: Once | INTRAVENOUS | Status: AC
  Administered 2015-07-10: 6 mg via INTRAVENOUS

## 2015-07-10 MED ORDER — IOHEXOL 350 MG/ML SOLN
80.0000 mL | Freq: Once | INTRAVENOUS | Status: AC | PRN
  Administered 2015-07-10: 80 mL via INTRAVENOUS

## 2015-07-10 MED ORDER — SODIUM CHLORIDE 0.9 % IV SOLN
INTRAVENOUS | Status: DC
  Administered 2015-07-10 – 2015-07-11 (×4): via INTRAVENOUS

## 2015-07-10 MED ORDER — METOPROLOL SUCCINATE ER 50 MG PO TB24
50.0000 mg | ORAL_TABLET | Freq: Every day | ORAL | Status: DC
Start: 2015-07-11 — End: 2015-07-10

## 2015-07-10 NOTE — H&P (Signed)
[facility] NAME: [name]    MR#:  [id number]  DATE OF BIRTH:  05-18-1951  DATE OF ADMISSION:  07/10/2015  PRIMARY CARE PHYSICIAN: [place]   REQUESTING/REFERRING PHYSICIAN: Dr Jacqualine Code  CHIEF COMPLAINT:   Chief Complaint  Patient presents with  . Abdominal Pain    HISTORY OF PRESENT ILLNESS:  [name]  is a 64 y.o. male with a known history of paroxysmal SVT, diabetes, hypertension, hyperlipidemia who presented this morning with right lower quadrant pain. He describes a constant sharp right sided abdominal pain which has been present for 2 days. No nausea vomiting diarrhea no melena. On presentation is in SVT which converted to normal sinus with 1 dose of adenosine. He has continued to be hypotensive with systolic blood pressures in the low 90s despite 2 L of IV fluids in the emergency room. He denies presyncope, orthostatic symptoms, chest pain, palpitations or shortness of breath.  PAST MEDICAL HISTORY:   Past Medical History  Diagnosis Date  . Hypertension   . Diabetes mellitus without complication ([place])   . Renal insufficiency     PAST SURGICAL HISTORY:   Past Surgical History  Procedure Laterality Date  . Cholecystectomy      SOCIAL HISTORY:   Social History  Substance Use Topics  . Smoking status: Former Research scientist (life sciences)  . Smokeless tobacco: Not on file  . Alcohol Use: No    FAMILY HISTORY:  History reviewed. No pertinent family history.   Does state that his brother's "Intestines died on him"  DRUG ALLERGIES:  No Known Allergies  REVIEW OF SYSTEMS:   Review of Systems  Constitutional: Negative for fever, chills, weight loss and malaise/fatigue.  HENT: Negative for congestion and hearing loss.   Eyes: Negative for blurred vision and pain.  Respiratory: Negative for cough, hemoptysis, sputum production, shortness of breath and stridor.   Cardiovascular: Negative for chest  pain, palpitations, orthopnea and leg swelling.  Gastrointestinal: Positive for abdominal pain. Negative for nausea, vomiting, diarrhea, constipation and blood in stool.  Genitourinary: Negative for dysuria and frequency.  Musculoskeletal: Negative for myalgias, back pain, joint pain and neck pain.  Skin: Negative for rash.  Neurological: Negative for focal weakness, loss of consciousness and headaches.  Endo/Heme/Allergies: Does not bruise/bleed easily.  Psychiatric/Behavioral: Negative for depression and hallucinations. The patient is not nervous/anxious.     MEDICATIONS AT HOME:   Prior to Admission medications   Medication Sig Start Date End Date Taking? Authorizing Provider  aspirin EC 81 MG tablet Take 81 mg by mouth daily.   Yes Historical Provider, MD  calcium-vitamin D (CALCIUM 500/D) 500-200 MG-UNIT tablet Take 1 tablet by mouth daily.   Yes Historical Provider, MD  diphenhydrAMINE (BENADRYL) 25 mg capsule Take 25 mg by mouth every 6 (six) hours as needed for itching.    Yes Historical Provider, MD  gabapentin (NEURONTIN) 100 MG capsule Take 100 mg by mouth at bedtime.   Yes Historical Provider, MD  glyBURIDE (DIABETA) 5 MG tablet Take 2.5 mg by mouth 2 (two) times daily.   Yes Historical Provider, MD  lisinopril (PRINIVIL,ZESTRIL) 10 MG tablet Take 5 mg by mouth daily.   Yes Historical Provider, MD  lovastatin (MEVACOR) 10 MG tablet Take 10 mg by mouth at bedtime.   Yes Historical Provider, MD  metoprolol succinate (TOPROL-XL) 50 MG 24 hr tablet Take 50 mg by mouth daily.   Yes Historical Provider, MD  traMADol Veatrice [place])  50 MG tablet Take 50 mg by mouth 2 (two) times daily as needed for moderate pain or severe pain.    Yes Historical Provider, MD      VITAL SIGNS:  Blood pressure 118/71, pulse 71, temperature 97.4 F (36.3 C), temperature source Oral, resp. rate 20, height 5\' 11"  (1.803 m), weight 83.462 kg (184 lb), SpO2 100 %.  PHYSICAL EXAMINATION:  GENERAL:  64  y.o.-year-old patient lying in the bed with no acute distress.  EYES: Pupils equal, round, reactive to light and accommodation. No scleral icterus. Extraocular muscles intact.  HEENT: Head atraumatic, normocephalic. Oropharynx and nasopharynx clear.  NECK:  Supple, no jugular venous distention. No thyroid enlargement, no tenderness.  LUNGS: Normal breath sounds bilaterally, no wheezing, rales,rhonchi or crepitation. No use of accessory muscles of respiration.  CARDIOVASCULAR: S1, S2 normal. No murmurs, rubs, or gallops.  ABDOMEN: Soft, nontender, nondistended. Bowel sounds present. No organomegaly or mass.  EXTREMITIES: No pedal edema, cyanosis, or clubbing.  NEUROLOGIC: Cranial nerves II through XII are intact. Muscle strength 5/5 in all extremities. Sensation intact. Gait not checked.  PSYCHIATRIC: The patient is alert and oriented x 3.  SKIN: No obvious rash, lesion, or ulcer.   LABORATORY PANEL:   CBC  Recent Labs Lab 07/10/15 1444  WBC 6.8  HGB 15.2  HCT 44.3  PLT 168   ------------------------------------------------------------------------------------------------------------------  Chemistries   Recent Labs Lab 07/10/15 0931 07/10/15 1444  NA 131*  --   K 5.1  --   CL 96*  --   CO2 26  --   GLUCOSE 249*  --   BUN 47*  --   CREATININE 2.20* 1.65*  CALCIUM 9.1  --   AST 27  --   ALT 19  --   ALKPHOS 58  --   BILITOT 2.1*  --    ------------------------------------------------------------------------------------------------------------------  Cardiac Enzymes  Recent Labs Lab 07/10/15 0931  TROPONINI <0.03   ------------------------------------------------------------------------------------------------------------------  RADIOLOGY:  Ct Angio Chest Aorta W/cm &/or Wo/cm  07/10/2015  CLINICAL DATA:  RIGHT-sided abdominal pain which initiated 2 days prior. Patient tachycardic at hypertensive. Scan perirenal cold with such. EXAM: CT ANGIOGRAPHY CHEST,  ABDOMEN AND PELVIS TECHNIQUE: Multidetector CT imaging through the chest, abdomen and pelvis was performed using the standard protocol during bolus administration of intravenous contrast. Multiplanar reconstructed images and MIPs were obtained and reviewed to evaluate the vascular anatomy. CONTRAST:  26mL OMNIPAQUE IOHEXOL 350 MG/ML SOLN. Reduced dose for renal insufficiency. COMPARISON:  CT abdomen 12/30/2006 FINDINGS: CTA CHEST FINDINGS Mediastinum/Nodes: Non IV contrast images demonstrate no intramural hematoma within aorta. There is intimal calcifications along the arch and origin of great vessels. Contrast enhanced images demonstrate no evidence of dissection or aneurysm of the aorta or great vessels. Mixed soft and calcified plaque is noted. Exam was not timed to evaluate the pulmonary arteries. No axillary or supraclavicular lymphadenopathy. No mediastinal hilar adenopathy. Esophagus. No pericardial fluid. Lungs/Pleura: Centrilobular emphysema with upper lobe predominance. No pneumothorax, pleural fluid or airspace disease. Review of the MIP images confirms the above findings. CTA ABDOMEN AND PELVIS FINDINGS Hepatobiliary: No focal hepatic lesion. No biliary duct dilatation. Gallbladder is normal. Common bile duct is normal. Pancreas: Pancreas is normal. No ductal dilatation. No pancreatic inflammation. Spleen: Normal spleen Adrenals/urinary tract: Adrenal glands and kidneys are normal. The ureters and bladder normal. Stomach/Bowel: Stomach, small-bowel, appendix, cecum normal. Colon and rectosigmoid colon are normal. Vascular/Lymphatic: The aorta is normal caliber without evidence of dissection or aneurysm. There is intimal calcification at  the ostia of the SMA and celiac trunk. Calcifications at the bilateral single renal arteries. The IMA is patent. No aneurysm of the iliac arteries. Reproductive: Prostate normal.  LEFT hydrocele. Musculoskeletal: No aggressive osseous lesion. Other: No free fluid. Review  of the MIP images confirms the above findings. IMPRESSION: Chest Impression: 1. No evidence of aortic dissection or aneurysm. 2. Atherosclerotic calcification aorta 3. No acute pulmonary parenchymal findings. 4. Centrilobular emphysema. Abdomen / Pelvis Impression: 1. No evidence of dissection or aneurysm the abdominal aorta. Atherosclerotic calcification noted. 2.  No acute findings in the abdomen pelvis.  Appendix normal. Electronically Signed   By: Suzy Bouchard M.D.   On: 07/10/2015 12:02   Ct Cta Abd/pel W/cm &/or W/o Cm  07/10/2015  CLINICAL DATA:  RIGHT-sided abdominal pain which initiated 2 days prior. Patient tachycardic at hypertensive. Scan perirenal cold with such. EXAM: CT ANGIOGRAPHY CHEST, ABDOMEN AND PELVIS TECHNIQUE: Multidetector CT imaging through the chest, abdomen and pelvis was performed using the standard protocol during bolus administration of intravenous contrast. Multiplanar reconstructed images and MIPs were obtained and reviewed to evaluate the vascular anatomy. CONTRAST:  51mL OMNIPAQUE IOHEXOL 350 MG/ML SOLN. Reduced dose for renal insufficiency. COMPARISON:  CT abdomen 12/30/2006 FINDINGS: CTA CHEST FINDINGS Mediastinum/Nodes: Non IV contrast images demonstrate no intramural hematoma within aorta. There is intimal calcifications along the arch and origin of great vessels. Contrast enhanced images demonstrate no evidence of dissection or aneurysm of the aorta or great vessels. Mixed soft and calcified plaque is noted. Exam was not timed to evaluate the pulmonary arteries. No axillary or supraclavicular lymphadenopathy. No mediastinal hilar adenopathy. Esophagus. No pericardial fluid. Lungs/Pleura: Centrilobular emphysema with upper lobe predominance. No pneumothorax, pleural fluid or airspace disease. Review of the MIP images confirms the above findings. CTA ABDOMEN AND PELVIS FINDINGS Hepatobiliary: No focal hepatic lesion. No biliary duct dilatation. Gallbladder is normal.  Common bile duct is normal. Pancreas: Pancreas is normal. No ductal dilatation. No pancreatic inflammation. Spleen: Normal spleen Adrenals/urinary tract: Adrenal glands and kidneys are normal. The ureters and bladder normal. Stomach/Bowel: Stomach, small-bowel, appendix, cecum normal. Colon and rectosigmoid colon are normal. Vascular/Lymphatic: The aorta is normal caliber without evidence of dissection or aneurysm. There is intimal calcification at the ostia of the SMA and celiac trunk. Calcifications at the bilateral single renal arteries. The IMA is patent. No aneurysm of the iliac arteries. Reproductive: Prostate normal.  LEFT hydrocele. Musculoskeletal: No aggressive osseous lesion. Other: No free fluid. Review of the MIP images confirms the above findings. IMPRESSION: Chest Impression: 1. No evidence of aortic dissection or aneurysm. 2. Atherosclerotic calcification aorta 3. No acute pulmonary parenchymal findings. 4. Centrilobular emphysema. Abdomen / Pelvis Impression: 1. No evidence of dissection or aneurysm the abdominal aorta. Atherosclerotic calcification noted. 2.  No acute findings in the abdomen pelvis.  Appendix normal. Electronically Signed   By: Suzy Bouchard M.D.   On: 07/10/2015 12:02    EKG:   Orders placed or performed during the hospital encounter of 07/10/15  . EKG 12-Lead  . EKG 12-Lead    IMPRESSION AND PLAN:    #1 SVT: This is now resolved with adenosine. Will observe on telemetry. We will cycle cardiac enzymes and obtain a 2-D echocardiogram. This is his second episode of SVT. He sees Dr. Saralyn Pilar.  I will ask for cardiology consultation. Continue metoprolol. Check TSH  #2 acute renal failure: Baseline creatinine 1.3 today is up to 2.2. This is likely due to hypotension possibly due to volume depletion.  Continuing hydration. Blood pressure responding, anticipate renal function will improve. Avoid nephrotoxins will hold lisinopril. Check UA  #3 hyperkalemia: Continue  hydration. Recheck potassium.   #4 diabetes mellitus type 2: Check hemoglobin A1c. Start sliding scale. Hold oral hypoglycemic agents while inpatient  #5 elevated lipase: No signs of pancreatitis on abdominal CT scan. He may have mild inflammation which is contributing to his abdominal pain. Will recheck lipase in the morning. Continue aggressive hydration.    CODE STATUS:  FULL  TOTAL TIME TAKING CARE OF THIS PATIENT: 40 minutes.  Greater than 50% of time spent in coordination of care and counseling. care plan discussed with emergency room physician and also with the patient at the bedside.   Myrtis Ser M.D on 07/10/2015 at 3:24 PM  Between 7am to 6pm - Pager - [phone]  After 6pm go to [email or web address] - password [email or web address] [employer] [facility]  Office  [phone]  CC: Primary care physician; [place]

## 2015-07-10 NOTE — ED Notes (Signed)
Adenosine administered, seen paper chart for cardioversion.

## 2015-07-10 NOTE — Progress Notes (Signed)
*  PRELIMINARY RESULTS* Echocardiogram 2D Echocardiogram has been performed.  [name] 07/10/2015, 5:51 PM

## 2015-07-10 NOTE — ED Notes (Signed)
Pt to ed with c/o right side abd pain that started on Tuesday.  Pt brought from [facility] for pain.  Pt denies n/v/d.

## 2015-07-10 NOTE — ED Provider Notes (Signed)
[facility] Emergency Department Provider Note REMINDER - THIS NOTE IS NOT A FINAL MEDICAL RECORD UNTIL IT IS SIGNED. UNTIL THEN, THE CONTENT BELOW MAY REFLECT INFORMATION FROM A DOCUMENTATION TEMPLATE, NOT THE ACTUAL PATIENT VISIT. ____________________________________________  Time seen: Approximately 9:27 AM  I have reviewed the triage vital signs and the nursing notes.   HISTORY  Chief Complaint Abdominal Pain    HPI [name] is a 64 y.o. male previous history of diabetes, hypertension, SVT, cholecystectomy.  Patient presents today notes that he isn't having 2 days severe right-sided abdominal pain. This is not associated with nausea vomiting or fever. Reports a sharp pain in the right flank which started 2 days ago, seems slightly worse today's ago and is slightly better today but he feels lightheaded as well.  He denies any chest pain or trouble breathing. No pain in the groin or testicles. No blood in his urine.   Past Medical History  Diagnosis Date  . Hypertension   . Diabetes mellitus without complication ([place])   . Renal insufficiency     There are no active problems to display for this patient.   Past Surgical History  Procedure Laterality Date  . Cholecystectomy      Current Outpatient Rx  Name  Route  Sig  Dispense  Refill  . aspirin EC 81 MG tablet   Oral   Take 81 mg by mouth daily.         . calcium-vitamin D (CALCIUM 500/D) 500-200 MG-UNIT tablet   Oral   Take 1 tablet by mouth daily.         . diphenhydrAMINE (BENADRYL) 25 mg capsule   Oral   Take 25 mg by mouth every 6 (six) hours as needed for itching.          . gabapentin (NEURONTIN) 100 MG capsule   Oral   Take 100 mg by mouth at bedtime.         Marland Kitchen glyBURIDE (DIABETA) 5 MG tablet   Oral   Take 2.5 mg by mouth 2 (two) times daily.         Marland Kitchen lisinopril (PRINIVIL,ZESTRIL) 10 MG tablet   Oral   Take 5 mg by mouth daily.         Marland Kitchen lovastatin  (MEVACOR) 10 MG tablet   Oral   Take 10 mg by mouth at bedtime.         . metoprolol succinate (TOPROL-XL) 50 MG 24 hr tablet   Oral   Take 50 mg by mouth daily.         . traMADol (ULTRAM) 50 MG tablet   Oral   Take 50 mg by mouth 2 (two) times daily as needed for moderate pain or severe pain.            Allergies Review of patient's allergies indicates no known allergies.  History reviewed. No pertinent family history.  Social History Social History  Substance Use Topics  . Smoking status: Former Research scientist (life sciences)  . Smokeless tobacco: None  . Alcohol Use: No    Review of Systems Constitutional: No fever/chills Eyes: No visual changes. ENT: No sore throat. Cardiovascular: Denies chest pain. Respiratory: Denies shortness of breath. Gastrointestinal: No nausea, no vomiting.  No diarrhea.  No constipation. Genitourinary: Negative for dysuria. Musculoskeletal: Negative for back pain. Skin: Negative for rash. Neurological: Negative for headaches, focal weakness or numbness. She feels lightheaded and tired.  10-point ROS otherwise negative.  ____________________________________________   PHYSICAL  EXAM:  VITAL SIGNS: ED Triage Vitals  Enc Vitals Group     BP 07/10/15 0847 92/58 mmHg     Pulse Rate 07/10/15 0847 150     Resp 07/10/15 0847 20     Temp --      Temp Source 07/10/15 0847 Oral     SpO2 07/10/15 0847 88 %     Weight 07/10/15 0847 184 lb (83.462 kg)     Height 07/10/15 0847 5\' 11"  (1.803 m)     Head Cir --      Peak Flow --      Pain Score 07/10/15 0849 2     Pain Loc --      Pain Edu? --      Excl. in [place]? --    Constitutional: Alert and oriented. Fatigued but in no distress. Eyes: Conjunctivae are normal. PERRL. EOMI. Head: Atraumatic. Nose: No congestion/rhinnorhea. Mouth/Throat: Mucous membranes are dry.  Oropharynx non-erythematous. Neck: No stridor.   Cardiovascular: Tachycardic rate, regular rhythm. Grossly normal heart sounds.  Good  peripheral circulation. Respiratory: Normal respiratory effort.  No retractions. Lungs CTAB. Gastrointestinal: Soft but moderately tender across the right flank. No distention. No abdominal bruits.  Musculoskeletal: No lower extremity tenderness nor edema.  No joint effusions. Neurologic:  Normal speech and language. No gross focal neurologic deficits are appreciated.Skin:  Skin is warm, dry and intact. No rash noted. Psychiatric: Mood and affect are normal. Speech and behavior are normal.  ____________________________________________   LABS (all labs ordered are listed, but only abnormal results are displayed)  Labs Reviewed  LIPASE, BLOOD - Abnormal; Notable for the following:    Lipase 117 (*)    All other components within normal limits  COMPREHENSIVE METABOLIC PANEL - Abnormal; Notable for the following:    Sodium 131 (*)    Chloride 96 (*)    Glucose, Bld 249 (*)    BUN 47 (*)    Creatinine, Ser 2.20 (*)    Total Bilirubin 2.1 (*)    GFR calc non Af Amer 31 (*)    GFR calc Af Amer 35 (*)    All other components within normal limits  CBC  TROPONIN I  URINALYSIS COMPLETEWITH MICROSCOPIC ([facility] ONLY)   ____________________________________________  EKG  Reviewed and interpreted as supraventricular tachycardia, rate 150. No acute ischemic changes. EKG time 8:50 AM QRS 100 QTc 470 Reviewed and risk for ventricular tachycardia  Repeat EKG performed at 9:15 AM Review of interpret as sinus rhythm Ventricular rate 85 Possible old anteroseptal infarct Intervals otherwise normal Reviewed and interpreted as normal sinus rhythm with probable old infarct, no evidence of acute ischemia. Of note the patient is chest pain-free at this time. ____________________________________________  RADIOLOGY  CT CTA Abd/Pel w/cm &/or w/o cm (Final result) Result time: 07/10/15 12:02:51   Final result by Rad Results In Interface (07/10/15 12:02:51)   Narrative:   CLINICAL DATA:  RIGHT-sided abdominal pain which initiated 2 days prior. Patient tachycardic at hypertensive. Scan perirenal cold with such.  EXAM: CT ANGIOGRAPHY CHEST, ABDOMEN AND PELVIS  TECHNIQUE: Multidetector CT imaging through the chest, abdomen and pelvis was performed using the standard protocol during bolus administration of intravenous contrast. Multiplanar reconstructed images and MIPs were obtained and reviewed to evaluate the vascular anatomy.  CONTRAST: 69mL OMNIPAQUE IOHEXOL 350 MG/ML SOLN. Reduced dose for renal insufficiency.  COMPARISON: CT abdomen 12/30/2006  FINDINGS: CTA CHEST FINDINGS  Mediastinum/Nodes: Non IV contrast images demonstrate no intramural hematoma within aorta. There is intimal  calcifications along the arch and origin of great vessels. Contrast enhanced images demonstrate no evidence of dissection or aneurysm of the aorta or great vessels. Mixed soft and calcified plaque is noted. Exam was not timed to evaluate the pulmonary arteries.  No axillary or supraclavicular lymphadenopathy. No mediastinal hilar adenopathy. Esophagus. No pericardial fluid.  Lungs/Pleura: Centrilobular emphysema with upper lobe predominance. No pneumothorax, pleural fluid or airspace disease.  Review of the MIP images confirms the above findings.  CTA ABDOMEN AND PELVIS FINDINGS  Hepatobiliary: No focal hepatic lesion. No biliary duct dilatation. Gallbladder is normal. Common bile duct is normal.  Pancreas: Pancreas is normal. No ductal dilatation. No pancreatic inflammation.  Spleen: Normal spleen  Adrenals/urinary tract: Adrenal glands and kidneys are normal. The ureters and bladder normal.  Stomach/Bowel: Stomach, small-bowel, appendix, cecum normal. Colon and rectosigmoid colon are normal.  Vascular/Lymphatic: The aorta is normal caliber without evidence of dissection or aneurysm. There is intimal calcification at the ostia of the SMA and celiac trunk.  Calcifications at the bilateral single renal arteries. The IMA is patent. No aneurysm of the iliac arteries.  Reproductive: Prostate normal. LEFT hydrocele.  Musculoskeletal: No aggressive osseous lesion.  Other: No free fluid.  Review of the MIP images confirms the above findings.  IMPRESSION: Chest Impression:  1. No evidence of aortic dissection or aneurysm. 2. Atherosclerotic calcification aorta 3. No acute pulmonary parenchymal findings. 4. Centrilobular emphysema.  Abdomen / Pelvis Impression:  1. No evidence of dissection or aneurysm the abdominal aorta. Atherosclerotic calcification noted.  2. No acute findings in the abdomen pelvis. Appendix normal.     ____________________________________________   PROCEDURES  Procedure(s) performed: None  Critical Care performed: Yes, see critical care note(s)  CRITICAL CARE Performed by: Delman Kitten   Total critical care time: 40  Critical care time was exclusive of separately billable procedures and treating other patients.  Critical care was necessary to treat or prevent imminent or life-threatening deterioration.  Critical care was time spent personally by me on the following activities: development of treatment plan with patient and/or surrogate as well as nursing, discussions with consultants, evaluation of patient's response to treatment, examination of patient, obtaining history from patient or surrogate, ordering and performing treatments and interventions, ordering and review of laboratory studies, ordering and review of radiographic studies, pulse oximetry and re-evaluation of patient's condition.   CT Angio discussed with Dr. Bubba Camp ([place]) prior to study re: GFR 31 but desire rule out AAA/Vascular emergency. Patient GFR > 30, technically within allowable protocol. Have hydrated well. Clincally, patient hypotension and need to rule out acute vascular emergency. Will provessed with  CTA. ____________________________________________   INITIAL IMPRESSION / ASSESSMENT AND PLAN / ED COURSE  Pertinent labs & imaging results that were available during my care of the patient were reviewed by me and considered in my medical decision making (see chart for details).  The patient presents initially in supraventricular tachycardia requiring IV cardioversion with adenosine. He reconverted to normal sinus rhythm well but continues to have persistent right lower quadrant and right flank pain. The patient has notable hypotension and associated, we have resuscitated aggressively with IV fluids and his blood pressure is improving.  No acute cardiopulmonary symptoms, but did present in SVT. I'm primarily concerned about the possibility of an acute vascular emergency causing hypotension, but other considerations would certainly be pancreatitis, hepatitis, urinary tract infection, sepsis, appendicitis, perforation, etc.  Based on the patient's low GFR, but greater than 30 and after significant hydration we will  obtain CT angiography to rule out vascular emergency as at this point, I'm very concerned about the patient's hypotension associated with his abdominal pain. The possibility of life-threatening condition is Strongly considered.  ----------------------------------------- 12:44 PM on 07/10/2015 -----------------------------------------  Patient CT scan is reassuring. No evidence of acute. Hhe currently still has mild hypotension, but awake alert and watching television. I question if the patient may have been in prolonged SVT causing hypotension and acute renal insufficiency, though it is unclear to me. He is much improved at this time. We will admit the patient to hospital service for ongoing care and observation. ____________________________________________   FINAL CLINICAL IMPRESSION(S) / ED DIAGNOSES  Final diagnoses:  Right flank discomfort  Hypotension, unspecified hypotension  type  Acute kidney injury ([place])  SVT (supraventricular tachycardia) ([facility])      Delman Kitten, MD 07/10/15 1245

## 2015-07-10 NOTE — Progress Notes (Signed)
Patient admitted to floor no c/o pain no distress. Tolerating RA, telemetry reading nsr. Ivf infusing. Patient A@O . Skin verified by second nurse Janett Billow c. Telemetry box verified box number [id number]. Will continue to monitor

## 2015-07-10 NOTE — ED Notes (Signed)
Heart rate stable, patient A & O x4.

## 2015-07-10 NOTE — Consult Note (Signed)
Reason for Consult:SVT/CP Referring Physician:  Dr. Volanda Napoleon hospitalist  cardiologists Dr. Eugenie Birks is an 64 y.o. male.  HPI:  Patient presents with SVT tachycardia palpitations. Patient started complaining right abdominal lower chest discomfort he was relatively persistent denies shortness of breath came to the emergency room was found to be in SVT at a rate of 140 patient complained of mild lightheadedness. Patient was treated medically with adenosine for conversion back to sinus rhythm many of his symptoms resolved. Patient states his last and only episode before this was about a year ago on Christmas Eve for when he  came to emergency room he did notice was happening and he felt very similar to how he felt today. Patient denies any coronary disease history no previous stent site or coronary bypass a myocardial infarction is been treated for hypertension and diabetes denies smoking occur consumption is not involved in illicit drugs or excessive caffeine  Past Medical History  Diagnosis Date  . Hypertension   . Diabetes mellitus without complication ([place])   . Renal insufficiency     Past Surgical History  Procedure Laterality Date  . Cholecystectomy      History reviewed. No pertinent family history.  Social History:  reports that he has quit smoking. He does not have any smokeless tobacco history on file. He reports that he does not drink alcohol or use illicit drugs.  Allergies: No Known Allergies  Medications: I have reviewed the patient's current medications.  Results for orders placed or performed during the hospital encounter of 07/10/15 (from the past 48 hour(s))  Lipase, blood     Status: Abnormal   Collection Time: 07/10/15  9:31 AM  Result Value Ref Range   Lipase 117 (H) 22 - 51 U/L  Comprehensive metabolic panel     Status: Abnormal   Collection Time: 07/10/15  9:31 AM  Result Value Ref Range   Sodium 131 (L) 135 - 145 mmol/L   Potassium 5.1 3.5 - 5.1  mmol/L    Comment: HEMOLYSIS AT THIS LEVEL MAY AFFECT RESULT   Chloride 96 (L) 101 - 111 mmol/L   CO2 26 22 - 32 mmol/L   Glucose, Bld 249 (H) 65 - 99 mg/dL   BUN 47 (H) 6 - 20 mg/dL   Creatinine, Ser 2.20 (H) 0.61 - 1.24 mg/dL   Calcium 9.1 8.9 - 10.3 mg/dL   Total Protein 7.4 6.5 - 8.1 g/dL   Albumin 3.9 3.5 - 5.0 g/dL   AST 27 15 - 41 U/L   ALT 19 17 - 63 U/L   Alkaline Phosphatase 58 38 - 126 U/L   Total Bilirubin 2.1 (H) 0.3 - 1.2 mg/dL   GFR calc non Af Amer 31 (L) >60 mL/min   GFR calc Af Amer 35 (L) >60 mL/min    Comment: (NOTE) The eGFR has been calculated using the CKD EPI equation. This calculation has not been validated in all clinical situations. eGFR's persistently <60 mL/min signify possible Chronic Kidney Disease.    Anion gap 9 5 - 15  CBC     Status: None   Collection Time: 07/10/15  9:31 AM  Result Value Ref Range   WBC 9.8 3.8 - 10.6 K/uL   RBC 5.48 4.40 - 5.90 MIL/uL   Hemoglobin 16.5 13.0 - 18.0 g/dL   HCT 48.6 40.0 - 52.0 %   MCV 88.6 80.0 - 100.0 fL   MCH 30.1 26.0 - 34.0 pg   MCHC 33.9 32.0 -  36.0 g/dL   RDW 13.4 11.5 - 14.5 %   Platelets 187 150 - 440 K/uL  Troponin I     Status: None   Collection Time: 07/10/15  9:31 AM  Result Value Ref Range   Troponin I <0.03 <0.031 ng/mL    Comment:        NO INDICATION OF MYOCARDIAL INJURY.   Troponin I (q 6hr x 3)     Status: None   Collection Time: 07/10/15  2:44 PM  Result Value Ref Range   Troponin I 0.03 <0.031 ng/mL    Comment:        NO INDICATION OF MYOCARDIAL INJURY.   TSH     Status: None   Collection Time: 07/10/15  2:44 PM  Result Value Ref Range   TSH 3.796 0.350 - 4.500 uIU/mL  CBC     Status: None   Collection Time: 07/10/15  2:44 PM  Result Value Ref Range   WBC 6.8 3.8 - 10.6 K/uL   RBC 5.01 4.40 - 5.90 MIL/uL   Hemoglobin 15.2 13.0 - 18.0 g/dL   HCT 44.3 40.0 - 52.0 %   MCV 88.3 80.0 - 100.0 fL   MCH 30.4 26.0 - 34.0 pg   MCHC 34.4 32.0 - 36.0 g/dL   RDW 13.0 11.5 -  14.5 %   Platelets 168 150 - 440 K/uL  Creatinine, serum     Status: Abnormal   Collection Time: 07/10/15  2:44 PM  Result Value Ref Range   Creatinine, Ser 1.65 (H) 0.61 - 1.24 mg/dL   GFR calc non Af Amer 43 (L) >60 mL/min   GFR calc Af Amer 50 (L) >60 mL/min    Comment: (NOTE) The eGFR has been calculated using the CKD EPI equation. This calculation has not been validated in all clinical situations. eGFR's persistently <60 mL/min signify possible Chronic Kidney Disease.   Glucose, capillary     Status: Abnormal   Collection Time: 07/10/15  4:11 PM  Result Value Ref Range   Glucose-Capillary 116 (H) 65 - 99 mg/dL   Comment 1 Notify RN     Ct Angio Chest Aorta W/cm &/or Wo/cm  07/10/2015  CLINICAL DATA:  RIGHT-sided abdominal pain which initiated 2 days prior. Patient tachycardic at hypertensive. Scan perirenal cold with such. EXAM: CT ANGIOGRAPHY CHEST, ABDOMEN AND PELVIS TECHNIQUE: Multidetector CT imaging through the chest, abdomen and pelvis was performed using the standard protocol during bolus administration of intravenous contrast. Multiplanar reconstructed images and MIPs were obtained and reviewed to evaluate the vascular anatomy. CONTRAST:  50m OMNIPAQUE IOHEXOL 350 MG/ML SOLN. Reduced dose for renal insufficiency. COMPARISON:  CT abdomen 12/30/2006 FINDINGS: CTA CHEST FINDINGS Mediastinum/Nodes: Non IV contrast images demonstrate no intramural hematoma within aorta. There is intimal calcifications along the arch and origin of great vessels. Contrast enhanced images demonstrate no evidence of dissection or aneurysm of the aorta or great vessels. Mixed soft and calcified plaque is noted. Exam was not timed to evaluate the pulmonary arteries. No axillary or supraclavicular lymphadenopathy. No mediastinal hilar adenopathy. Esophagus. No pericardial fluid. Lungs/Pleura: Centrilobular emphysema with upper lobe predominance. No pneumothorax, pleural fluid or airspace disease. Review of  the MIP images confirms the above findings. CTA ABDOMEN AND PELVIS FINDINGS Hepatobiliary: No focal hepatic lesion. No biliary duct dilatation. Gallbladder is normal. Common bile duct is normal. Pancreas: Pancreas is normal. No ductal dilatation. No pancreatic inflammation. Spleen: Normal spleen Adrenals/urinary tract: Adrenal glands and kidneys are normal. The ureters and bladder  normal. Stomach/Bowel: Stomach, small-bowel, appendix, cecum normal. Colon and rectosigmoid colon are normal. Vascular/Lymphatic: The aorta is normal caliber without evidence of dissection or aneurysm. There is intimal calcification at the ostia of the SMA and celiac trunk. Calcifications at the bilateral single renal arteries. The IMA is patent. No aneurysm of the iliac arteries. Reproductive: Prostate normal.  LEFT hydrocele. Musculoskeletal: No aggressive osseous lesion. Other: No free fluid. Review of the MIP images confirms the above findings. IMPRESSION: Chest Impression: 1. No evidence of aortic dissection or aneurysm. 2. Atherosclerotic calcification aorta 3. No acute pulmonary parenchymal findings. 4. Centrilobular emphysema. Abdomen / Pelvis Impression: 1. No evidence of dissection or aneurysm the abdominal aorta. Atherosclerotic calcification noted. 2.  No acute findings in the abdomen pelvis.  Appendix normal. Electronically Signed   By: Suzy Bouchard M.D.   On: 07/10/2015 12:02   Ct Cta Abd/pel W/cm &/or W/o Cm  07/10/2015  CLINICAL DATA:  RIGHT-sided abdominal pain which initiated 2 days prior. Patient tachycardic at hypertensive. Scan perirenal cold with such. EXAM: CT ANGIOGRAPHY CHEST, ABDOMEN AND PELVIS TECHNIQUE: Multidetector CT imaging through the chest, abdomen and pelvis was performed using the standard protocol during bolus administration of intravenous contrast. Multiplanar reconstructed images and MIPs were obtained and reviewed to evaluate the vascular anatomy. CONTRAST:  78m OMNIPAQUE IOHEXOL 350 MG/ML  SOLN. Reduced dose for renal insufficiency. COMPARISON:  CT abdomen 12/30/2006 FINDINGS: CTA CHEST FINDINGS Mediastinum/Nodes: Non IV contrast images demonstrate no intramural hematoma within aorta. There is intimal calcifications along the arch and origin of great vessels. Contrast enhanced images demonstrate no evidence of dissection or aneurysm of the aorta or great vessels. Mixed soft and calcified plaque is noted. Exam was not timed to evaluate the pulmonary arteries. No axillary or supraclavicular lymphadenopathy. No mediastinal hilar adenopathy. Esophagus. No pericardial fluid. Lungs/Pleura: Centrilobular emphysema with upper lobe predominance. No pneumothorax, pleural fluid or airspace disease. Review of the MIP images confirms the above findings. CTA ABDOMEN AND PELVIS FINDINGS Hepatobiliary: No focal hepatic lesion. No biliary duct dilatation. Gallbladder is normal. Common bile duct is normal. Pancreas: Pancreas is normal. No ductal dilatation. No pancreatic inflammation. Spleen: Normal spleen Adrenals/urinary tract: Adrenal glands and kidneys are normal. The ureters and bladder normal. Stomach/Bowel: Stomach, small-bowel, appendix, cecum normal. Colon and rectosigmoid colon are normal. Vascular/Lymphatic: The aorta is normal caliber without evidence of dissection or aneurysm. There is intimal calcification at the ostia of the SMA and celiac trunk. Calcifications at the bilateral single renal arteries. The IMA is patent. No aneurysm of the iliac arteries. Reproductive: Prostate normal.  LEFT hydrocele. Musculoskeletal: No aggressive osseous lesion. Other: No free fluid. Review of the MIP images confirms the above findings. IMPRESSION: Chest Impression: 1. No evidence of aortic dissection or aneurysm. 2. Atherosclerotic calcification aorta 3. No acute pulmonary parenchymal findings. 4. Centrilobular emphysema. Abdomen / Pelvis Impression: 1. No evidence of dissection or aneurysm the abdominal aorta.  Atherosclerotic calcification noted. 2.  No acute findings in the abdomen pelvis.  Appendix normal. Electronically Signed   By: SSuzy BouchardM.D.   On: 07/10/2015 12:02    Review of Systems  Constitutional: Positive for malaise/fatigue.  HENT: Negative.   Eyes: Negative.   Respiratory: Positive for shortness of breath.   Cardiovascular: Positive for chest pain and palpitations.  Gastrointestinal: Positive for heartburn and abdominal pain.  Genitourinary: Negative.   Musculoskeletal: Negative.   Skin: Negative.   Neurological: Positive for weakness.  Endo/Heme/Allergies: Negative.   Psychiatric/Behavioral: Negative.    Blood  pressure 118/71, pulse 71, temperature 97.4 F (36.3 C), temperature source Oral, resp. rate 20, height _0  (1.778 m), weight 83.099 kg (183 lb 3.2 oz), SpO2 100 %. Physical Exam  Nursing note and vitals reviewed. Constitutional: He is oriented to person, place, and time. He appears well-developed and well-nourished.  HENT:  Head: Normocephalic.  Eyes: Conjunctivae and EOM are normal. Pupils are equal, round, and reactive to light.  Neck: Normal range of motion. Neck supple.  Cardiovascular: Normal rate, regular rhythm and normal heart sounds.   Respiratory: Effort normal and breath sounds normal.  GI: Soft. Bowel sounds are normal. There is no guarding.  Musculoskeletal: Normal range of motion.  Neurological: He is alert and oriented to person, place, and time. He has normal reflexes.  Skin: Skin is warm and dry.  Psychiatric: He has a normal mood and affect.    Assessment/Plan:  SVT paroxysmal  hypertension  diabetes  renal insufficiency  borderline obesity  remote smoking  chest pain . PLAN  would increase metoprolol  To 100 mg once a day  consider antiarrhythmic like sotalol  In addition to metoprolol  continue hypertension control  recommend aggressive diabetes management  aspirin therapy for arteriosclerotic vascular disease  do not  recommend long-term anticoagulation at this stage  continue device to refrain from tobacco abuse  consider nephrology evaluation for renal insufficiency  consider echocardiogram for left ventricular function and wall motion  consider functional studies either inpatient or  Outpatient  do not recommend EP evaluation or ablation because of the infrequent nature of the SVT  CALLWOOD,DWAYNE D. 07/10/2015, 5:29 PM

## 2015-07-10 NOTE — ED Notes (Signed)
Patient presented to Rm 5 with triage nurse, patient tachycardic and hypotensive on arrival. Patients skin was pale and cool to touch. MD at bedside.

## 2015-07-11 LAB — BASIC METABOLIC PANEL
Anion gap: 5 (ref 5–15)
BUN: 29 mg/dL — AB (ref 6–20)
CHLORIDE: 103 mmol/L (ref 101–111)
CO2: 27 mmol/L (ref 22–32)
CREATININE: 1.29 mg/dL — AB (ref 0.61–1.24)
Calcium: 8.9 mg/dL (ref 8.9–10.3)
GFR calc Af Amer: >60 mL/min (ref >60)
GFR calc non Af Amer: 58 mL/min — ABNORMAL LOW (ref >60)
GLUCOSE: 184 mg/dL — AB (ref 65–99)
POTASSIUM: 5.7 mmol/L — AB (ref 3.5–5.1)
SODIUM: 135 mmol/L (ref 135–145)

## 2015-07-11 LAB — CBC
HEMATOCRIT: 40.7 % (ref 40.0–52.0)
Hemoglobin: 14.3 g/dL (ref 13.0–18.0)
MCH: 30.8 pg (ref 26.0–34.0)
MCHC: 35.2 g/dL (ref 32.0–36.0)
MCV: 87.7 fL (ref 80.0–100.0)
PLATELETS: 126 10*3/uL — AB (ref 150–440)
RBC: 4.64 MIL/uL (ref 4.40–5.90)
RDW: 12.8 % (ref 11.5–14.5)
WBC: 5.5 10*3/uL (ref 3.8–10.6)

## 2015-07-11 LAB — URINALYSIS COMPLETE WITH MICROSCOPIC (ARMC ONLY)
BACTERIA UA: NONE SEEN
Bilirubin Urine: NEGATIVE
Glucose, UA: 150 mg/dL — AB
Ketones, ur: NEGATIVE mg/dL
LEUKOCYTES UA: NEGATIVE
NITRITE: NEGATIVE
PH: 6 (ref 5.0–8.0)
Protein, ur: NEGATIVE mg/dL
Specific Gravity, Urine: 1.012 (ref 1.005–1.030)
Squamous Epithelial / LPF: NONE SEEN
WBC UA: NONE SEEN WBC/hpf (ref 0–5)

## 2015-07-11 LAB — TROPONIN I: Troponin I: <0.03 ng/mL (ref <0.031)

## 2015-07-11 LAB — POTASSIUM
POTASSIUM: 5.1 mmol/L (ref 3.5–5.1)
POTASSIUM: 4.6 mmol/L (ref 3.5–5.1)

## 2015-07-11 LAB — LIPASE, BLOOD: Lipase: 117 U/L — ABNORMAL HIGH (ref 22–51)

## 2015-07-11 LAB — HEMOGLOBIN A1C: HEMOGLOBIN A1C: 6.4 % — AB (ref 4.0–6.0)

## 2015-07-11 LAB — GLUCOSE, CAPILLARY
GLUCOSE-CAPILLARY: 193 mg/dL — AB (ref 65–99)
GLUCOSE-CAPILLARY: 150 mg/dL — AB (ref 65–99)
Glucose-Capillary: 155 mg/dL — ABNORMAL HIGH (ref 65–99)
Glucose-Capillary: 151 mg/dL — ABNORMAL HIGH (ref 65–99)

## 2015-07-11 MED ORDER — SODIUM CHLORIDE 0.9 % IV SOLN
INTRAVENOUS | Status: DC
  Administered 2015-07-11: 15:00:00 via INTRAVENOUS

## 2015-07-11 MED ORDER — INSULIN ASPART 100 UNIT/ML ~~LOC~~ SOLN
10.0000 [IU] | Freq: Once | SUBCUTANEOUS | Status: AC
  Administered 2015-07-11: 10 [IU] via INTRAVENOUS
  Filled 2015-07-11: qty 10

## 2015-07-11 MED ORDER — SODIUM CHLORIDE 0.9 % IV SOLN
1.0000 g | Freq: Once | INTRAVENOUS | Status: AC
  Administered 2015-07-11: 1 g via INTRAVENOUS
  Filled 2015-07-11: qty 10

## 2015-07-11 MED ORDER — SODIUM POLYSTYRENE SULFONATE 15 GM/60ML PO SUSP
30.0000 g | Freq: Once | ORAL | Status: AC
  Administered 2015-07-11: 30 g via ORAL
  Filled 2015-07-11: qty 120

## 2015-07-11 MED ORDER — SODIUM POLYSTYRENE SULFONATE 15 GM/60ML PO SUSP
15.0000 g | Freq: Once | ORAL | Status: AC
  Administered 2015-07-11: 15 g via ORAL
  Filled 2015-07-11: qty 60

## 2015-07-11 MED ORDER — DEXTROSE 50 % IV SOLN
1.0000 | Freq: Once | INTRAVENOUS | Status: AC
  Administered 2015-07-11: 50 mL via INTRAVENOUS
  Filled 2015-07-11: qty 50

## 2015-07-11 MED ORDER — SODIUM BICARBONATE 8.4 % IV SOLN
50.0000 meq | Freq: Once | INTRAVENOUS | Status: AC
  Administered 2015-07-11: 50 meq via INTRAVENOUS
  Filled 2015-07-11: qty 50

## 2015-07-11 NOTE — Progress Notes (Signed)
Patient ID: [name], male   DOB: 1951/03/10, 64 y.o.   MRN: [id number] [facility] PROGRESS NOTE  PCP: [place]  HPI/Subjective:   Objective: Filed Vitals:   07/11/15 0505  BP: 148/97  Pulse: 79  Temp: 98.2 F (36.8 C)  Resp: 18    Filed Weights   07/10/15 0847 07/10/15 1435  Weight: 83.462 kg (184 lb) 83.099 kg (183 lb 3.2 oz)    ROS: Review of Systems  Constitutional: Negative for fever and chills.  Eyes: Negative for blurred vision.  Respiratory: Negative for cough and shortness of breath.   Cardiovascular: Negative for chest pain.  Gastrointestinal: Positive for constipation. Negative for nausea, vomiting, abdominal pain and diarrhea.  Genitourinary: Negative for dysuria.  Musculoskeletal: Negative for joint pain.  Neurological: Negative for dizziness and headaches.   Exam: Physical Exam  Constitutional: He is oriented to person, place, and time.  HENT:  Nose: No mucosal edema.  Mouth/Throat: No oropharyngeal exudate or posterior oropharyngeal edema.  Eyes: Conjunctivae, EOM and lids are normal. Pupils are equal, round, and reactive to light.  Neck: No JVD present. Carotid bruit is not present. No edema present. No thyroid mass and no thyromegaly present.  Cardiovascular: S1 normal and S2 normal.  Exam reveals no gallop.   No murmur heard. Pulses:      Dorsalis pedis pulses are 2+ on the right side, and 2+ on the left side.  Respiratory: No respiratory distress. He has no wheezes. He has no rhonchi. He has no rales.  GI: Soft. Bowel sounds are normal. There is no tenderness.  Musculoskeletal:       Right ankle: He exhibits no swelling.       Left ankle: He exhibits no swelling.  Lymphadenopathy:    He has no cervical adenopathy.  Neurological: He is alert and oriented to person, place, and time. No cranial nerve deficit.  Skin: Skin is warm. No rash noted. Nails show no clubbing.  Chronic lower extremity discoloration  bilateral  Psychiatric: He has a normal mood and affect.     Data Reviewed: Basic Metabolic Panel:  Recent Labs Lab 07/10/15 0931 07/10/15 1444 07/11/15 0435  NA 131*  --  135  K 5.1  --  5.7*  CL 96*  --  103  CO2 26  --  27  GLUCOSE 249*  --  184*  BUN 47*  --  29*  CREATININE 2.20* 1.65* 1.29*  CALCIUM 9.1  --  8.9   Liver Function Tests:  Recent Labs Lab 07/10/15 0931  AST 27  ALT 19  ALKPHOS 58  BILITOT 2.1*  PROT 7.4  ALBUMIN 3.9    Recent Labs Lab 07/10/15 0931 07/11/15 0435  LIPASE 117* 117*   CBC:  Recent Labs Lab 07/10/15 0931 07/10/15 1444 07/11/15 0435  WBC 9.8 6.8 5.5  HGB 16.5 15.2 14.3  HCT 48.6 44.3 40.7  MCV 88.6 88.3 87.7  PLT 187 168 126*   Cardiac Enzymes:  Recent Labs Lab 07/10/15 0931 07/10/15 1444 07/10/15 1928 07/11/15 0209  TROPONINI <0.03 0.03 <0.03 <0.03    CBG:  Recent Labs Lab 07/10/15 1611 07/10/15 2127 07/11/15 0723  GLUCAP 116* 157* 193*      Studies: Ct Angio Chest Aorta W/cm &/or Wo/cm  07/10/2015  CLINICAL DATA:  RIGHT-sided abdominal pain which initiated 2 days prior. Patient tachycardic at hypertensive. Scan perirenal cold with such. EXAM: CT ANGIOGRAPHY CHEST, ABDOMEN AND PELVIS TECHNIQUE: Multidetector CT imaging through the  chest, abdomen and pelvis was performed using the standard protocol during bolus administration of intravenous contrast. Multiplanar reconstructed images and MIPs were obtained and reviewed to evaluate the vascular anatomy. CONTRAST:  71mL OMNIPAQUE IOHEXOL 350 MG/ML SOLN. Reduced dose for renal insufficiency. COMPARISON:  CT abdomen 12/30/2006 FINDINGS: CTA CHEST FINDINGS Mediastinum/Nodes: Non IV contrast images demonstrate no intramural hematoma within aorta. There is intimal calcifications along the arch and origin of great vessels. Contrast enhanced images demonstrate no evidence of dissection or aneurysm of the aorta or great vessels. Mixed soft and calcified plaque is  noted. Exam was not timed to evaluate the pulmonary arteries. No axillary or supraclavicular lymphadenopathy. No mediastinal hilar adenopathy. Esophagus. No pericardial fluid. Lungs/Pleura: Centrilobular emphysema with upper lobe predominance. No pneumothorax, pleural fluid or airspace disease. Review of the MIP images confirms the above findings. CTA ABDOMEN AND PELVIS FINDINGS Hepatobiliary: No focal hepatic lesion. No biliary duct dilatation. Gallbladder is normal. Common bile duct is normal. Pancreas: Pancreas is normal. No ductal dilatation. No pancreatic inflammation. Spleen: Normal spleen Adrenals/urinary tract: Adrenal glands and kidneys are normal. The ureters and bladder normal. Stomach/Bowel: Stomach, small-bowel, appendix, cecum normal. Colon and rectosigmoid colon are normal. Vascular/Lymphatic: The aorta is normal caliber without evidence of dissection or aneurysm. There is intimal calcification at the ostia of the SMA and celiac trunk. Calcifications at the bilateral single renal arteries. The IMA is patent. No aneurysm of the iliac arteries. Reproductive: Prostate normal.  LEFT hydrocele. Musculoskeletal: No aggressive osseous lesion. Other: No free fluid. Review of the MIP images confirms the above findings. IMPRESSION: Chest Impression: 1. No evidence of aortic dissection or aneurysm. 2. Atherosclerotic calcification aorta 3. No acute pulmonary parenchymal findings. 4. Centrilobular emphysema. Abdomen / Pelvis Impression: 1. No evidence of dissection or aneurysm the abdominal aorta. Atherosclerotic calcification noted. 2.  No acute findings in the abdomen pelvis.  Appendix normal. Electronically Signed   By: Suzy Bouchard M.D.   On: 07/10/2015 12:02   Ct Cta Abd/pel W/cm &/or W/o Cm  07/10/2015  CLINICAL DATA:  RIGHT-sided abdominal pain which initiated 2 days prior. Patient tachycardic at hypertensive. Scan perirenal cold with such. EXAM: CT ANGIOGRAPHY CHEST, ABDOMEN AND PELVIS TECHNIQUE:  Multidetector CT imaging through the chest, abdomen and pelvis was performed using the standard protocol during bolus administration of intravenous contrast. Multiplanar reconstructed images and MIPs were obtained and reviewed to evaluate the vascular anatomy. CONTRAST:  71mL OMNIPAQUE IOHEXOL 350 MG/ML SOLN. Reduced dose for renal insufficiency. COMPARISON:  CT abdomen 12/30/2006 FINDINGS: CTA CHEST FINDINGS Mediastinum/Nodes: Non IV contrast images demonstrate no intramural hematoma within aorta. There is intimal calcifications along the arch and origin of great vessels. Contrast enhanced images demonstrate no evidence of dissection or aneurysm of the aorta or great vessels. Mixed soft and calcified plaque is noted. Exam was not timed to evaluate the pulmonary arteries. No axillary or supraclavicular lymphadenopathy. No mediastinal hilar adenopathy. Esophagus. No pericardial fluid. Lungs/Pleura: Centrilobular emphysema with upper lobe predominance. No pneumothorax, pleural fluid or airspace disease. Review of the MIP images confirms the above findings. CTA ABDOMEN AND PELVIS FINDINGS Hepatobiliary: No focal hepatic lesion. No biliary duct dilatation. Gallbladder is normal. Common bile duct is normal. Pancreas: Pancreas is normal. No ductal dilatation. No pancreatic inflammation. Spleen: Normal spleen Adrenals/urinary tract: Adrenal glands and kidneys are normal. The ureters and bladder normal. Stomach/Bowel: Stomach, small-bowel, appendix, cecum normal. Colon and rectosigmoid colon are normal. Vascular/Lymphatic: The aorta is normal caliber without evidence of dissection or aneurysm. There is  intimal calcification at the ostia of the SMA and celiac trunk. Calcifications at the bilateral single renal arteries. The IMA is patent. No aneurysm of the iliac arteries. Reproductive: Prostate normal.  LEFT hydrocele. Musculoskeletal: No aggressive osseous lesion. Other: No free fluid. Review of the MIP images confirms the  above findings. IMPRESSION: Chest Impression: 1. No evidence of aortic dissection or aneurysm. 2. Atherosclerotic calcification aorta 3. No acute pulmonary parenchymal findings. 4. Centrilobular emphysema. Abdomen / Pelvis Impression: 1. No evidence of dissection or aneurysm the abdominal aorta. Atherosclerotic calcification noted. 2.  No acute findings in the abdomen pelvis.  Appendix normal. Electronically Signed   By: Suzy Bouchard M.D.   On: 07/10/2015 12:02    Scheduled Meds: . aspirin EC  81 mg Oral Daily  . calcium gluconate  1 g Intravenous Once  . dextrose  1 ampule Intravenous Once  . gabapentin  100 mg Oral QHS  . heparin  5,000 Units Subcutaneous 3 times per day  . insulin aspart  0-5 Units Subcutaneous QHS  . insulin aspart  0-9 Units Subcutaneous TID WC  . insulin aspart  10 Units Intravenous Once  . metoprolol succinate  100 mg Oral Daily  . sodium bicarbonate  50 mEq Intravenous Once  . sodium chloride  3 mL Intravenous Q12H   Continuous Infusions: . sodium chloride 125 mL/hr at 07/11/15 0535    Assessment/Plan: Active Problems:   SVT (supraventricular tachycardia) ([place])   1. Hyperkalemia- potassium 5.7 today. Kayexalate, calcium gluconate IV, insulin and D50 IV, sodium bicarbonate IV. Check a potassium later today and again tomorrow morning. Likely the cause is acute renal failure and being on lisinopril. 2. Acute renal failure on chronic kidney disease stage III- continue IV fluid hydration 3. SVT- currently in normal sinus rhythm with good heart rate. Toprol increased to 100 mg. Cardiology following. Cardiac enzyme negative 4. Pancreatic enzyme up- unclear if this is pancreatitis or not. Patient had some abdominal pain last night. Currently no abdominal pain. 5. Diabetes mellitus without complication- sliding scale 6. Essential hypertension- continue Toprol  Code Status:     Code Status Orders        Start     Ordered   07/10/15 1431  Full code    Continuous     07/10/15 1430     Family Communication: left message for [place]. Patient asked me to call. Disposition Plan: home soon  Consultants:  cardiology  Time spent: 25 minutes  Loletha Grayer  [facility]

## 2015-07-11 NOTE — Plan of Care (Signed)
Problem: Phase II Progression Outcomes Goal: Tolerating diet Outcome: Completed/Met Date Met:  07/11/15 Pt is alert and oriented x 4, c/o abdominal discomfort but does not need pain medication, several small bm throughout the shift after 2 administrations of kayexalate, up to bathroom independently, tolerating diet, denies n/v, NSR on tele with controlled heart rate, potassium serum now within normal limits, IV fluids infusing, family visiting at bedside, pt is likely to d/c on 8/15, uneventful shift.

## 2015-07-11 NOTE — Care Management (Signed)
Patient presents from home.  Independent in all adls.  Drives.  Does have history of chronic renal disease.  No issues accessing medical care or obtaining meds.

## 2015-07-11 NOTE — Progress Notes (Signed)
Telephone conversation with dr Metro Kung, okay to d/c repeat potassium serum that was entered doubled, drop ns 0.9% infusion to 50 cc/ hour, give kayexelate once at 15 g.

## 2015-07-11 NOTE — Progress Notes (Signed)
Informed Dr. Redmond Pulling about patient's increase potassium level from 5.1 to now 5.7. New order for kayexalate 30g

## 2015-07-12 LAB — GLUCOSE, CAPILLARY: Glucose-Capillary: 147 mg/dL — ABNORMAL HIGH (ref 65–99)

## 2015-07-12 LAB — BASIC METABOLIC PANEL
Anion gap: 7 (ref 5–15)
BUN: 15 mg/dL (ref 6–20)
CALCIUM: 8.8 mg/dL — AB (ref 8.9–10.3)
CO2: 29 mmol/L (ref 22–32)
CREATININE: 1.04 mg/dL (ref 0.61–1.24)
Chloride: 100 mmol/L — ABNORMAL LOW (ref 101–111)
GFR calc Af Amer: >60 mL/min (ref >60)
GFR calc non Af Amer: >60 mL/min (ref >60)
GLUCOSE: 135 mg/dL — AB (ref 65–99)
Potassium: 4.4 mmol/L (ref 3.5–5.1)
Sodium: 136 mmol/L (ref 135–145)

## 2015-07-12 LAB — LIPASE, BLOOD: Lipase: 89 U/L — ABNORMAL HIGH (ref 22–51)

## 2015-07-12 MED ORDER — METOPROLOL SUCCINATE ER 100 MG PO TB24: 100.0000 mg | ORAL_TABLET | Freq: Every day | ORAL | Status: DC

## 2015-07-12 NOTE — Progress Notes (Signed)
PT discharged home, given his scrip and instructions for f/u appts, his tramadol was retrieved from the pharmacy and returned to him, IV site [facility], bleeding controlled, tele monitor turned in, pt opted to not wait for his work note, this RN told him he could return for it,  MD answered page after pt left but stated he would put a work note in incase patient returned for it.  Pt left hospital in car with his s/o

## 2015-07-12 NOTE — Progress Notes (Signed)
Patient ID: [name], male   DOB: 09-Aug-1951, 64 y.o.   MRN: [id number] [place] [facility] [place] was admitted to the [facility] on 07/10/2015 and Discharged  07/12/2015 and should be excused from work/school   for 4 days starting 07/10/2015 , may return to work/school without any restrictions.  Loletha Grayer M.D on 07/12/2015,at 10:56 AM  [place] [facility] [place]

## 2015-07-12 NOTE — Discharge Summary (Signed)
[facility] [place] NAME: [name]    MR#:  [id number]  DATE OF BIRTH:  1951/03/21  DATE OF ADMISSION:  07/10/2015 ADMITTING PHYSICIAN: Aldean Jewett, MD  DATE OF DISCHARGE: 07/12/2015  PRIMARY CARE PHYSICIAN: [place]    ADMISSION DIAGNOSIS:  SVT (supraventricular tachycardia) (HCC) [I47.1] Acute kidney injury ([place]) [N17.9] Right flank discomfort [R10.9] Hypotension, unspecified hypotension type [I95.9]  DISCHARGE DIAGNOSIS:  Active Problems:   SVT (supraventricular tachycardia) ([place])   SECONDARY DIAGNOSIS:   Past Medical History  Diagnosis Date  . Hypertension   . Diabetes mellitus without complication ([place])   . Renal insufficiency     HOSPITAL COURSE:   1. Supraventricular tachycardia. Patient received a dose of adenosine in the ER. Patient's Toprol was increased 100 mg. He remained in normal sinus rhythm during the entire hospital stay. 2. Acute renal failure- IV fluid hydration improved creatinine into the normal range. 3. Hyperkalemia- patient was given 2 doses of Kayexalate. He was given IV calcium gluconate, IV insulin and D50, IV sodium bicarbonate. Potassium upon discharge is 4.4. Lisinopril and acute renal failure probably the cause. 4. Abdominal pain with elevated lipase. Could be an acute pancreatitis. CT scan did not show any inflammation around the pancreas. Could be medication related with lisinopril or statin. Both these medications were stopped. 5. Type 2 diabetes without complication can go back on oral medications 6. Essential hypertension- stable on Toprol.  DISCHARGE CONDITIONS:   Satisfactory  CONSULTS OBTAINED:  Treatment Team:  Yolonda Kida, MD  DRUG ALLERGIES:  No Known Allergies  DISCHARGE MEDICATIONS:   Current Discharge Medication List    CONTINUE these medications which have CHANGED   Details  metoprolol succinate (TOPROL-XL) 100 MG 24 hr  tablet Take 1 tablet (100 mg total) by mouth daily. Take with or immediately following a meal. Qty: 30 tablet, Refills: 0      CONTINUE these medications which have NOT CHANGED   Details  aspirin EC 81 MG tablet Take 81 mg by mouth daily.    calcium-vitamin D (CALCIUM 500/D) 500-200 MG-UNIT tablet Take 1 tablet by mouth daily.    diphenhydrAMINE (BENADRYL) 25 mg capsule Take 25 mg by mouth every 6 (six) hours as needed for itching.     gabapentin (NEURONTIN) 100 MG capsule Take 100 mg by mouth at bedtime.    glyBURIDE (DIABETA) 5 MG tablet Take 2.5 mg by mouth 2 (two) times daily.    traMADol (ULTRAM) 50 MG tablet Take 50 mg by mouth 2 (two) times daily as needed for moderate pain or severe pain.       STOP taking these medications     lisinopril (PRINIVIL,ZESTRIL) 10 MG tablet      lovastatin (MEVACOR) 10 MG tablet          DISCHARGE INSTRUCTIONS:   Follow-up with Dr. Lorinda Creed one week Follow-up primary medical doctor 2 weeks  If you experience worsening of your admission symptoms, develop shortness of breath, life threatening emergency, suicidal or homicidal thoughts you must seek medical attention immediately by calling 911 or calling your MD immediately  if symptoms less severe.  You Must read complete instructions/literature along with all the possible adverse reactions/side effects for all the Medicines you take and that have been prescribed to you. Take any new Medicines after you have completely understood and accept all the possible adverse reactions/side effects.   Please note  You were cared for by a  hospitalist during your hospital stay. If you have any questions about your discharge medications or the care you received while you were in the hospital after you are discharged, you can call the unit and asked to speak with the hospitalist on call if the hospitalist that took care of you is not available. Once you are discharged, your primary care physician will  handle any further medical issues. Please note that NO REFILLS for any discharge medications will be authorized once you are discharged, as it is imperative that you return to your primary care physician (or establish a relationship with a primary care physician if you do not have one) for your aftercare needs so that they can reassess your need for medications and monitor your lab values.    Today   CHIEF COMPLAINT:   Chief Complaint  Patient presents with  . Abdominal Pain    HISTORY OF PRESENT ILLNESS:  [name]  is a 64 y.o. male with a known history of hypertension. Patient presented with abdominal pain and was in SVT. Also had acute renal failure and hyperkalemia.   VITAL SIGNS:  Blood pressure 130/80, pulse 72, temperature 98.1 F (36.7 C), temperature source Oral, resp. rate 16, height 5\' 10"  (1.778 m), weight 83.099 kg (183 lb 3.2 oz), SpO2 96 %.    PHYSICAL EXAMINATION:  GENERAL:  63 y.o.-year-old patient lying in the bed with no acute distress.  EYES: Pupils equal, round, reactive to light and accommodation. No scleral icterus. Extraocular muscles intact.  HEENT: Head atraumatic, normocephalic. Oropharynx and nasopharynx clear.  NECK:  Supple, no jugular venous distention. No thyroid enlargement, no tenderness.  LUNGS: Normal breath sounds bilaterally, no wheezing, rales,rhonchi or crepitation. No use of accessory muscles of respiration.  CARDIOVASCULAR: S1, S2 normal. No murmurs, rubs, or gallops.  ABDOMEN: Soft, non-tender, non-distended. Bowel sounds present. No organomegaly or mass.  EXTREMITIES: Trace pedal edema, no cyanosis, or clubbing.  NEUROLOGIC: Cranial nerves II through XII are intact. Muscle strength 5/5 in all extremities. Sensation intact. Gait not checked.  PSYCHIATRIC: The patient is alert and oriented x 3.  SKIN: No obvious rash, lesion, or ulcer.   DATA REVIEW:   CBC  Recent Labs Lab 07/11/15 0435  WBC 5.5  HGB 14.3  HCT 40.7  PLT 126*     Chemistries   Recent Labs Lab 07/10/15 0931  07/12/15 0414  NA 131*  < > 136  K 5.1  < > 4.4  CL 96*  < > 100*  CO2 26  < > 29  GLUCOSE 249*  < > 135*  BUN 47*  < > 15  CREATININE 2.20*  < > 1.04  CALCIUM 9.1  < > 8.8*  AST 27  --   --   ALT 19  --   --   ALKPHOS 58  --   --   BILITOT 2.1*  --   --   < > = values in this interval not displayed.  Cardiac Enzymes  Recent Labs Lab 07/11/15 0209  TROPONINI <0.03    Microbiology Results  Results for orders placed or performed in visit on 09/19/13  Culture, blood (single)     Status: None   Collection Time: 09/19/13 10:17 AM  Result Value Ref Range Status   Micro Text Report   Final       COMMENT                   NO GROWTH AEROBICALLY/ANAEROBICALLY IN 5 DAYS  ANTIBIOTIC                                                      Culture, blood (single)     Status: None   Collection Time: 09/19/13 10:17 AM  Result Value Ref Range Status   Micro Text Report   Final       COMMENT                   NO GROWTH AEROBICALLY/ANAEROBICALLY IN 5 DAYS   ANTIBIOTIC                                                        RADIOLOGY:  Ct Angio Chest Aorta W/cm &/or Wo/cm  07/10/2015  CLINICAL DATA:  RIGHT-sided abdominal pain which initiated 2 days prior. Patient tachycardic at hypertensive. Scan perirenal cold with such. EXAM: CT ANGIOGRAPHY CHEST, ABDOMEN AND PELVIS TECHNIQUE: Multidetector CT imaging through the chest, abdomen and pelvis was performed using the standard protocol during bolus administration of intravenous contrast. Multiplanar reconstructed images and MIPs were obtained and reviewed to evaluate the vascular anatomy. CONTRAST:  58mL OMNIPAQUE IOHEXOL 350 MG/ML SOLN. Reduced dose for renal insufficiency. COMPARISON:  CT abdomen 12/30/2006 FINDINGS: CTA CHEST FINDINGS Mediastinum/Nodes: Non IV contrast images demonstrate no intramural hematoma within aorta. There is intimal calcifications along the arch and origin of  great vessels. Contrast enhanced images demonstrate no evidence of dissection or aneurysm of the aorta or great vessels. Mixed soft and calcified plaque is noted. Exam was not timed to evaluate the pulmonary arteries. No axillary or supraclavicular lymphadenopathy. No mediastinal hilar adenopathy. Esophagus. No pericardial fluid. Lungs/Pleura: Centrilobular emphysema with upper lobe predominance. No pneumothorax, pleural fluid or airspace disease. Review of the MIP images confirms the above findings. CTA ABDOMEN AND PELVIS FINDINGS Hepatobiliary: No focal hepatic lesion. No biliary duct dilatation. Gallbladder is normal. Common bile duct is normal. Pancreas: Pancreas is normal. No ductal dilatation. No pancreatic inflammation. Spleen: Normal spleen Adrenals/urinary tract: Adrenal glands and kidneys are normal. The ureters and bladder normal. Stomach/Bowel: Stomach, small-bowel, appendix, cecum normal. Colon and rectosigmoid colon are normal. Vascular/Lymphatic: The aorta is normal caliber without evidence of dissection or aneurysm. There is intimal calcification at the ostia of the SMA and celiac trunk. Calcifications at the bilateral single renal arteries. The IMA is patent. No aneurysm of the iliac arteries. Reproductive: Prostate normal.  LEFT hydrocele. Musculoskeletal: No aggressive osseous lesion. Other: No free fluid. Review of the MIP images confirms the above findings. IMPRESSION: Chest Impression: 1. No evidence of aortic dissection or aneurysm. 2. Atherosclerotic calcification aorta 3. No acute pulmonary parenchymal findings. 4. Centrilobular emphysema. Abdomen / Pelvis Impression: 1. No evidence of dissection or aneurysm the abdominal aorta. Atherosclerotic calcification noted. 2.  No acute findings in the abdomen pelvis.  Appendix normal. Electronically Signed   By: Suzy Bouchard M.D.   On: 07/10/2015 12:02   Ct Cta Abd/pel W/cm &/or W/o Cm  07/10/2015  CLINICAL DATA:  RIGHT-sided abdominal pain  which initiated 2 days prior. Patient tachycardic at hypertensive. Scan perirenal cold with such. EXAM: CT ANGIOGRAPHY CHEST, ABDOMEN AND PELVIS TECHNIQUE: Multidetector CT imaging through  the chest, abdomen and pelvis was performed using the standard protocol during bolus administration of intravenous contrast. Multiplanar reconstructed images and MIPs were obtained and reviewed to evaluate the vascular anatomy. CONTRAST:  64mL OMNIPAQUE IOHEXOL 350 MG/ML SOLN. Reduced dose for renal insufficiency. COMPARISON:  CT abdomen 12/30/2006 FINDINGS: CTA CHEST FINDINGS Mediastinum/Nodes: Non IV contrast images demonstrate no intramural hematoma within aorta. There is intimal calcifications along the arch and origin of great vessels. Contrast enhanced images demonstrate no evidence of dissection or aneurysm of the aorta or great vessels. Mixed soft and calcified plaque is noted. Exam was not timed to evaluate the pulmonary arteries. No axillary or supraclavicular lymphadenopathy. No mediastinal hilar adenopathy. Esophagus. No pericardial fluid. Lungs/Pleura: Centrilobular emphysema with upper lobe predominance. No pneumothorax, pleural fluid or airspace disease. Review of the MIP images confirms the above findings. CTA ABDOMEN AND PELVIS FINDINGS Hepatobiliary: No focal hepatic lesion. No biliary duct dilatation. Gallbladder is normal. Common bile duct is normal. Pancreas: Pancreas is normal. No ductal dilatation. No pancreatic inflammation. Spleen: Normal spleen Adrenals/urinary tract: Adrenal glands and kidneys are normal. The ureters and bladder normal. Stomach/Bowel: Stomach, small-bowel, appendix, cecum normal. Colon and rectosigmoid colon are normal. Vascular/Lymphatic: The aorta is normal caliber without evidence of dissection or aneurysm. There is intimal calcification at the ostia of the SMA and celiac trunk. Calcifications at the bilateral single renal arteries. The IMA is patent. No aneurysm of the iliac  arteries. Reproductive: Prostate normal.  LEFT hydrocele. Musculoskeletal: No aggressive osseous lesion. Other: No free fluid. Review of the MIP images confirms the above findings. IMPRESSION: Chest Impression: 1. No evidence of aortic dissection or aneurysm. 2. Atherosclerotic calcification aorta 3. No acute pulmonary parenchymal findings. 4. Centrilobular emphysema. Abdomen / Pelvis Impression: 1. No evidence of dissection or aneurysm the abdominal aorta. Atherosclerotic calcification noted. 2.  No acute findings in the abdomen pelvis.  Appendix normal. Electronically Signed   By: Suzy Bouchard M.D.   On: 07/10/2015 12:02   Management plans discussed with the patient, family and they are in agreement.  CODE STATUS:     Code Status Orders        Start     Ordered   07/10/15 1431  Full code   Continuous     07/10/15 1430      TOTAL TIME TAKING CARE OF THIS PATIENT: 35 minutes.    Loletha Grayer M.D on 07/12/2015 at 9:31 AM  Between 7am to 6pm - Pager - [phone]  After 6pm go to [email or web address] - password [email or web address] [facility]  Office  [phone]  CC: Primary care physician; [place]

## 2015-07-28 DIAGNOSIS — I428 Other cardiomyopathies: Secondary | ICD-10-CM | POA: Insufficient documentation

## 2015-11-05 ENCOUNTER — Other Ambulatory Visit
Admission: RE | Admit: 2015-11-05 | Discharge: 2015-11-05 | Disposition: A | Discharge status: Home / Self Care | Payer: 59 | Source: Ambulatory Visit | Attending: Physician Assistant | Admitting: Physician Assistant

## 2015-11-05 DIAGNOSIS — I959 Hypotension, unspecified: Secondary | ICD-10-CM | POA: Diagnosis present

## 2015-11-05 DIAGNOSIS — E875 Hyperkalemia: Secondary | ICD-10-CM | POA: Insufficient documentation

## 2015-11-05 LAB — COMPREHENSIVE METABOLIC PANEL
ALK PHOS: 58 U/L (ref 38–126)
ALT: 24 U/L (ref 17–63)
ANION GAP: 6 (ref 5–15)
AST: 22 U/L (ref 15–41)
Albumin: 4.6 g/dL (ref 3.5–5.0)
BILIRUBIN TOTAL: 1.7 mg/dL — AB (ref 0.3–1.2)
BUN: 24 mg/dL — ABNORMAL HIGH (ref 6–20)
CALCIUM: 9.6 mg/dL (ref 8.9–10.3)
CO2: 29 mmol/L (ref 22–32)
CREATININE: 1.24 mg/dL (ref 0.61–1.24)
Chloride: 98 mmol/L — ABNORMAL LOW (ref 101–111)
GFR calc non Af Amer: >60 mL/min (ref >60)
Glucose, Bld: 164 mg/dL — ABNORMAL HIGH (ref 65–99)
Potassium: 5.5 mmol/L — ABNORMAL HIGH (ref 3.5–5.1)
Sodium: 133 mmol/L — ABNORMAL LOW (ref 135–145)
TOTAL PROTEIN: 7.7 g/dL (ref 6.5–8.1)

## 2015-11-05 LAB — CBC WITH DIFFERENTIAL/PLATELET
BASOS ABS: 0.1 10*3/uL (ref 0–0.1)
BASOS PCT: 1 %
EOS ABS: 0.3 10*3/uL (ref 0–0.7)
Eosinophils Relative: 6 %
HEMATOCRIT: 44.9 % (ref 40.0–52.0)
Hemoglobin: 15.6 g/dL (ref 13.0–18.0)
Lymphocytes Relative: 21 %
Lymphs Abs: 1 10*3/uL (ref 1.0–3.6)
MCH: 30.3 pg (ref 26.0–34.0)
MCHC: 34.9 g/dL (ref 32.0–36.0)
MCV: 87 fL (ref 80.0–100.0)
MONO ABS: 0.4 10*3/uL (ref 0.2–1.0)
MONOS PCT: 8 %
NEUTROS ABS: 3.1 10*3/uL (ref 1.4–6.5)
NEUTROS PCT: 64 %
Platelets: 131 10*3/uL — ABNORMAL LOW (ref 150–440)
RBC: 5.16 MIL/uL (ref 4.40–5.90)
RDW: 13.9 % (ref 11.5–14.5)
WBC: 4.9 10*3/uL (ref 3.8–10.6)

## 2015-11-05 LAB — PSA: PSA: 0.25 ng/mL (ref 0.00–4.00)

## 2015-11-05 LAB — LIPASE, BLOOD: Lipase: 26 U/L (ref 11–51)

## 2015-11-13 ENCOUNTER — Other Ambulatory Visit
Admission: RE | Admit: 2015-11-13 | Discharge: 2015-11-13 | Disposition: A | Discharge status: Home / Self Care | Payer: 59 | Source: Ambulatory Visit | Attending: Physician Assistant | Admitting: Physician Assistant

## 2015-11-13 DIAGNOSIS — I471 Supraventricular tachycardia: Secondary | ICD-10-CM | POA: Diagnosis not present

## 2015-11-13 DIAGNOSIS — I6523 Occlusion and stenosis of bilateral carotid arteries: Secondary | ICD-10-CM | POA: Diagnosis not present

## 2015-11-13 LAB — COMPREHENSIVE METABOLIC PANEL
ALBUMIN: 4.4 g/dL (ref 3.5–5.0)
ALK PHOS: 55 U/L (ref 38–126)
ALT: 25 U/L (ref 17–63)
ANION GAP: 7 (ref 5–15)
AST: 23 U/L (ref 15–41)
BILIRUBIN TOTAL: 1.8 mg/dL — AB (ref 0.3–1.2)
BUN: 27 mg/dL — ABNORMAL HIGH (ref 6–20)
CALCIUM: 9.1 mg/dL (ref 8.9–10.3)
CO2: 29 mmol/L (ref 22–32)
Chloride: 98 mmol/L — ABNORMAL LOW (ref 101–111)
Creatinine, Ser: 0.97 mg/dL (ref 0.61–1.24)
GFR calc non Af Amer: >60 mL/min (ref >60)
Glucose, Bld: 139 mg/dL — ABNORMAL HIGH (ref 65–99)
POTASSIUM: 4.3 mmol/L (ref 3.5–5.1)
Sodium: 134 mmol/L — ABNORMAL LOW (ref 135–145)
TOTAL PROTEIN: 7.4 g/dL (ref 6.5–8.1)

## 2016-08-27 ENCOUNTER — Ambulatory Visit
Admission: RE | Admit: 2016-08-27 | Discharge: 2016-08-27 | Disposition: A | Discharge status: Home / Self Care | Payer: 59 | Source: Ambulatory Visit | Attending: Nurse Practitioner | Admitting: Nurse Practitioner

## 2016-08-27 ENCOUNTER — Other Ambulatory Visit: Payer: Self-pay | Admitting: Nurse Practitioner

## 2016-08-27 DIAGNOSIS — M542 Cervicalgia: Secondary | ICD-10-CM

## 2016-08-27 DIAGNOSIS — M47812 Spondylosis without myelopathy or radiculopathy, cervical region: Secondary | ICD-10-CM | POA: Diagnosis not present

## 2016-08-27 IMAGING — CR DG CERVICAL SPINE COMPLETE 4+V
1 series · 6 of 6 positions shown · non-contrast
Comparison: None.

CLINICAL DATA: Neck pain over the last 4-5 days. Pain at the C7
level.

EXAM:
CERVICAL SPINE - COMPLETE 4+ VIEW

[Series 1: dg cervical spine complete · 0.14mm/px · 6 of 6 slices shown]
[im 1/6]
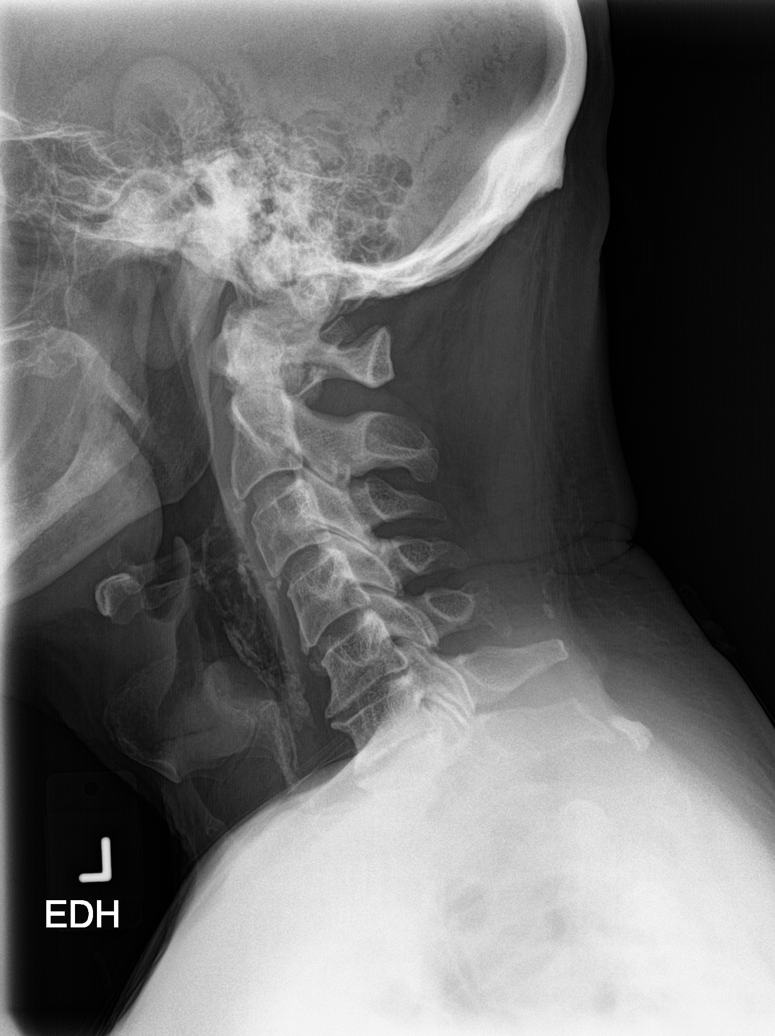
[im 2/6]
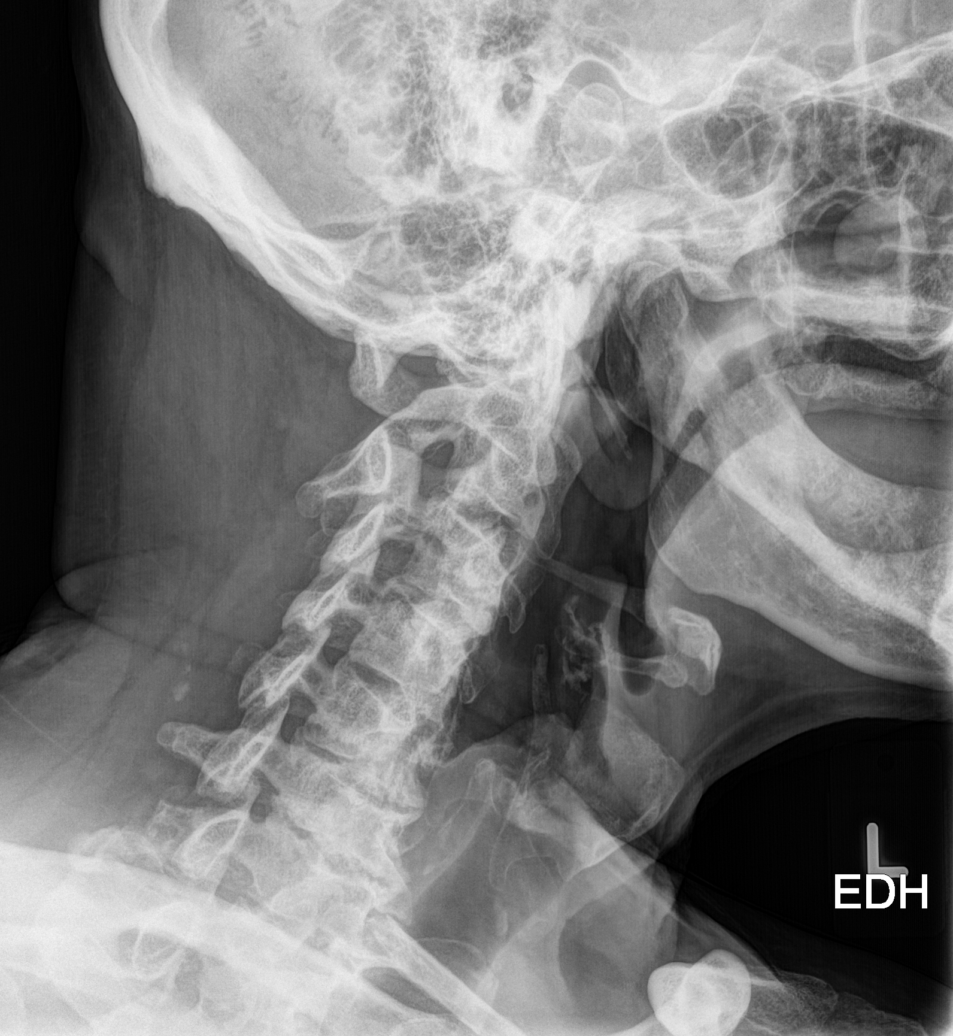
[im 3/6]
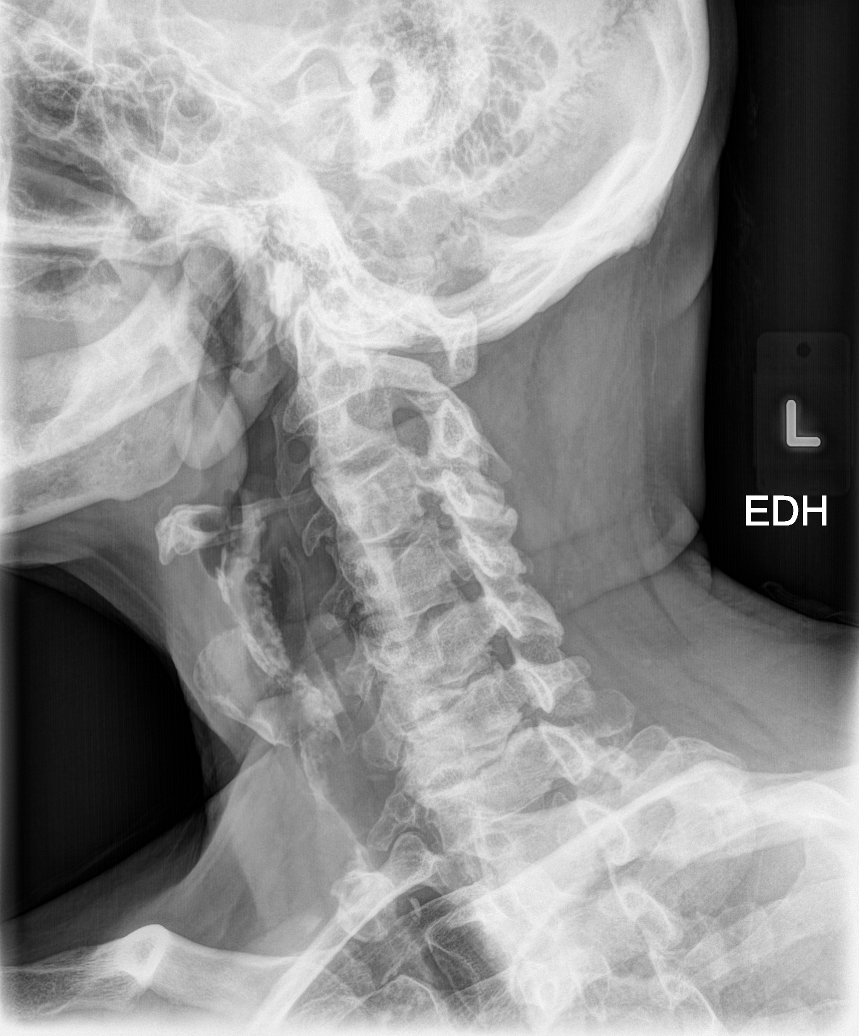
[im 4/6]
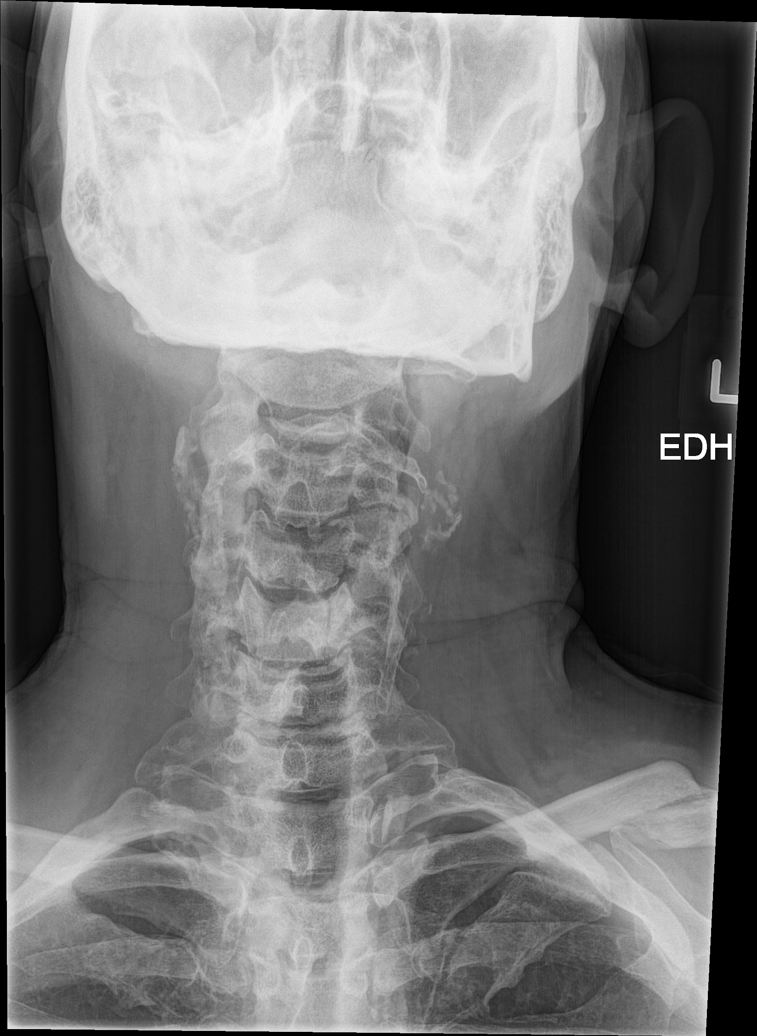
[im 5/6]
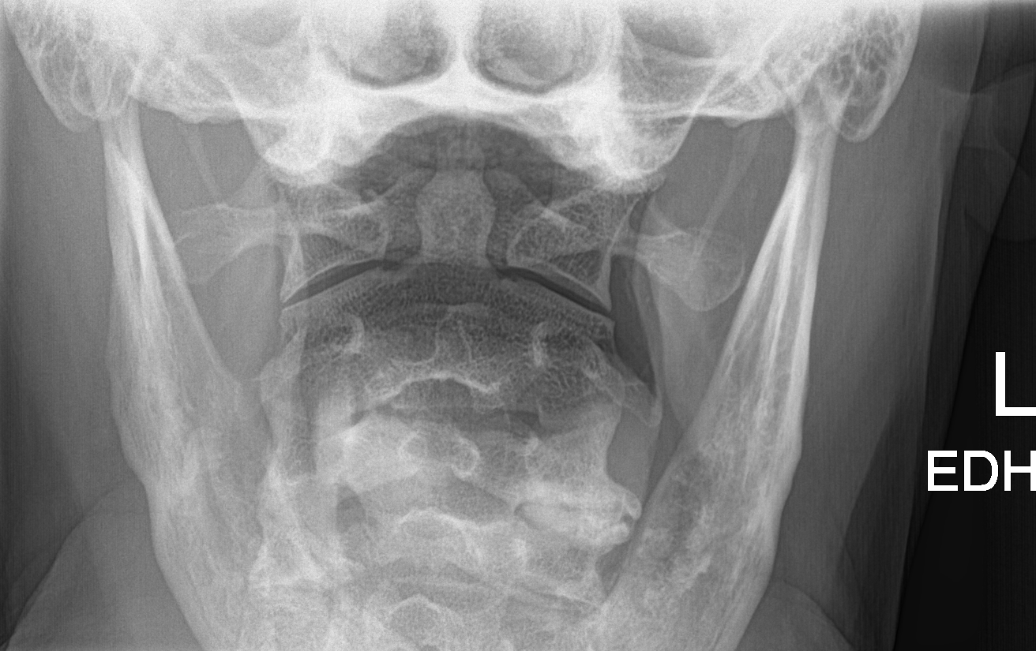
[im 6/6]
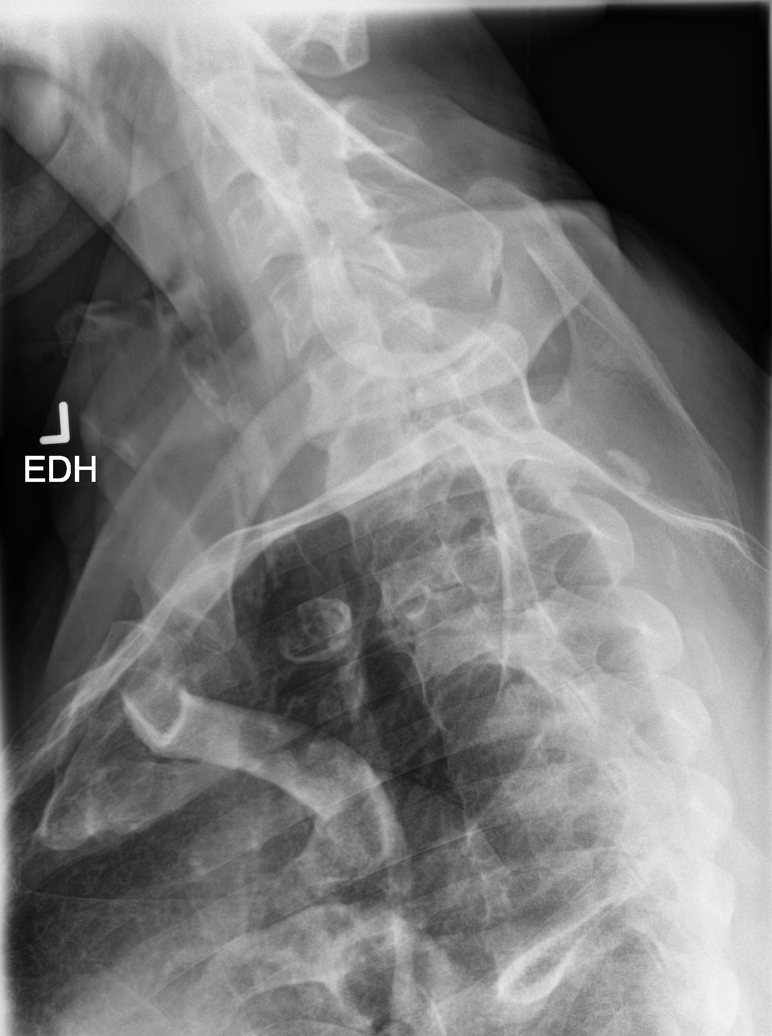

[6 of 6 positions shown; findings below may reference images not displayed]

FINDINGS: The cervical spine is visualized the skullbase through the cervical
thoracic junction. Vertebral body heights alignment are maintained.
Uncovertebral spurring is most prominent at C5-6, C6-7, and C7-T1.
Osseous foraminal narrowing is worse right than left at C5-6 and
worse on the left at C6-7. No acute fracture traumatic subluxation
is present. Lung apices are clear.
IMPRESSION: 1. Moderate spondylosis in the lower cervical spine with osseous
foraminal narrowing as described.
2. No acute abnormality.

## 2017-01-04 ENCOUNTER — Other Ambulatory Visit
Admission: RE | Admit: 2017-01-04 | Discharge: 2017-01-04 | Disposition: A | Discharge status: Home / Self Care | Payer: 59 | Source: Ambulatory Visit | Attending: Internal Medicine | Admitting: Internal Medicine

## 2017-01-04 DIAGNOSIS — Z125 Encounter for screening for malignant neoplasm of prostate: Secondary | ICD-10-CM | POA: Diagnosis not present

## 2017-01-04 DIAGNOSIS — E782 Mixed hyperlipidemia: Secondary | ICD-10-CM | POA: Diagnosis not present

## 2017-01-04 DIAGNOSIS — I10 Essential (primary) hypertension: Secondary | ICD-10-CM | POA: Insufficient documentation

## 2017-01-04 DIAGNOSIS — Z0001 Encounter for general adult medical examination with abnormal findings: Secondary | ICD-10-CM | POA: Diagnosis not present

## 2017-01-04 DIAGNOSIS — Z1329 Encounter for screening for other suspected endocrine disorder: Secondary | ICD-10-CM | POA: Insufficient documentation

## 2017-01-04 DIAGNOSIS — E1142 Type 2 diabetes mellitus with diabetic polyneuropathy: Secondary | ICD-10-CM | POA: Diagnosis not present

## 2017-01-04 LAB — COMPREHENSIVE METABOLIC PANEL
ALBUMIN: 4.3 g/dL (ref 3.5–5.0)
ALT: 20 U/L (ref 17–63)
ANION GAP: 6 (ref 5–15)
AST: 17 U/L (ref 15–41)
Alkaline Phosphatase: 46 U/L (ref 38–126)
BILIRUBIN TOTAL: 1.7 mg/dL — AB (ref 0.3–1.2)
BUN: 31 mg/dL — ABNORMAL HIGH (ref 6–20)
CHLORIDE: 99 mmol/L — AB (ref 101–111)
CO2: 30 mmol/L (ref 22–32)
Calcium: 9.4 mg/dL (ref 8.9–10.3)
Creatinine, Ser: 1.15 mg/dL (ref 0.61–1.24)
GFR calc Af Amer: >60 mL/min (ref >60)
Glucose, Bld: 140 mg/dL — ABNORMAL HIGH (ref 65–99)
POTASSIUM: 4.5 mmol/L (ref 3.5–5.1)
Sodium: 135 mmol/L (ref 135–145)
TOTAL PROTEIN: 7.2 g/dL (ref 6.5–8.1)

## 2017-01-04 LAB — CBC WITH DIFFERENTIAL/PLATELET
BASOS ABS: 0 10*3/uL (ref 0–0.1)
Basophils Relative: 1 %
Eosinophils Absolute: 0.2 10*3/uL (ref 0–0.7)
Eosinophils Relative: 5 %
HCT: 43.8 % (ref 40.0–52.0)
Hemoglobin: 15.2 g/dL (ref 13.0–18.0)
LYMPHS ABS: 1.3 10*3/uL (ref 1.0–3.6)
LYMPHS PCT: 27 %
MCH: 30.4 pg (ref 26.0–34.0)
MCHC: 34.7 g/dL (ref 32.0–36.0)
MCV: 87.6 fL (ref 80.0–100.0)
Monocytes Absolute: 0.4 10*3/uL (ref 0.2–1.0)
Monocytes Relative: 8 %
NEUTROS ABS: 2.7 10*3/uL (ref 1.4–6.5)
Neutrophils Relative %: 59 %
PLATELETS: 144 10*3/uL — AB (ref 150–440)
RBC: 5 MIL/uL (ref 4.40–5.90)
RDW: 13.7 % (ref 11.5–14.5)
WBC: 4.7 10*3/uL (ref 3.8–10.6)

## 2017-01-04 LAB — T4, FREE: Free T4: 0.82 ng/dL (ref 0.61–1.12)

## 2017-01-04 LAB — PSA: PSA: 0.28 ng/mL (ref 0.00–4.00)

## 2017-01-04 LAB — TSH: TSH: 4.814 u[IU]/mL — AB (ref 0.350–4.500)

## 2017-01-04 LAB — OCCULT BLOOD X 1 CARD TO LAB, STOOL: Fecal Occult Bld: NEGATIVE

## 2017-01-05 LAB — MICROALBUMIN, URINE

## 2017-02-23 ENCOUNTER — Other Ambulatory Visit: Payer: Self-pay | Admitting: Podiatry

## 2017-02-24 ENCOUNTER — Encounter
Admission: RE | Admit: 2017-02-24 | Discharge: 2017-02-24 | Disposition: A | Discharge status: Home / Self Care | Payer: 59 | Source: Ambulatory Visit | Attending: Podiatry | Admitting: Podiatry

## 2017-02-24 DIAGNOSIS — I96 Gangrene, not elsewhere classified: Secondary | ICD-10-CM | POA: Diagnosis present

## 2017-02-24 DIAGNOSIS — Z7984 Long term (current) use of oral hypoglycemic drugs: Secondary | ICD-10-CM | POA: Diagnosis not present

## 2017-02-24 DIAGNOSIS — Z87891 Personal history of nicotine dependence: Secondary | ICD-10-CM | POA: Diagnosis not present

## 2017-02-24 DIAGNOSIS — Z792 Long term (current) use of antibiotics: Secondary | ICD-10-CM | POA: Diagnosis not present

## 2017-02-24 DIAGNOSIS — E1152 Type 2 diabetes mellitus with diabetic peripheral angiopathy with gangrene: Secondary | ICD-10-CM | POA: Diagnosis not present

## 2017-02-24 DIAGNOSIS — Z79899 Other long term (current) drug therapy: Secondary | ICD-10-CM | POA: Diagnosis not present

## 2017-02-24 DIAGNOSIS — Z7951 Long term (current) use of inhaled steroids: Secondary | ICD-10-CM | POA: Diagnosis not present

## 2017-02-24 DIAGNOSIS — Z7982 Long term (current) use of aspirin: Secondary | ICD-10-CM | POA: Diagnosis not present

## 2017-02-24 DIAGNOSIS — I10 Essential (primary) hypertension: Secondary | ICD-10-CM | POA: Diagnosis not present

## 2017-02-24 HISTORY — DX: Multiple fractures of ribs, unspecified side, initial encounter for closed fracture: S22.49XA

## 2017-02-24 HISTORY — DX: Supraventricular tachycardia, unspecified: I47.10

## 2017-02-24 HISTORY — DX: Supraventricular tachycardia: I47.1

## 2017-02-24 HISTORY — DX: Dyspnea, unspecified: R06.00

## 2017-02-24 HISTORY — DX: Fracture of unspecified parts of lumbosacral spine and pelvis, initial encounter for closed fracture: S32.9XXA

## 2017-02-24 HISTORY — DX: Fracture of unspecified part of unspecified clavicle, initial encounter for closed fracture: S42.009A

## 2017-02-24 MED ORDER — CLINDAMYCIN PHOSPHATE 900 MG/50ML IV SOLN
900.0000 mg | INTRAVENOUS | Status: AC
  Administered 2017-02-25: 900 mg via INTRAVENOUS

## 2017-02-24 NOTE — Patient Instructions (Addendum)
  Your procedure is scheduled on: Friday February 25, 2017. Report to Same Day Surgery at noon..  Remember: Instructions that are not followed completely may result in serious medical risk, up to and including death, or upon the discretion of your surgeon and anesthesiologist your surgery may need to be rescheduled.    _x___ 1. Do not eat food or drink liquids after midnight. No gum chewing or hard candies.     ____ 2. No Alcohol for 24 hours before or after surgery.   ____ 3. Bring all medications with you on the day of surgery if instructed.    __x__ 4. Notify your doctor if there is any change in your medical condition     (cold, fever, infections).    _____ 5. No smoking 24 hours prior to surgery.     Do not wear jewelry, make-up, hairpins, clips or nail polish.  Do not wear lotions, powders, or perfumes.   Do not shave 48 hours prior to surgery. Men may shave face and neck.  Do not bring valuables to the hospital.    [facility] is not responsible for any belongings or valuables.               Contacts, dentures or bridgework may not be worn into surgery.  Leave your suitcase in the car. After surgery it may be brought to your room.  For patients admitted to the hospital, discharge time is determined by your treatment team.   Patients discharged the day of surgery will not be allowed to drive home.    Please read over the following fact sheets that you were given:   [facility] Preparing for Surgery  _x___ Take these medicines the morning of surgery with A SIP OF WATER:    1. Lisinopril (blood pressure pill)  2. Metoprolol ( heart pill)   ____ Fleet Enema (as directed)   _x___ Use CHG Soap as directed on instruction sheet  ____ Use inhalers on the day of surgery and bring to hospital day of surgery  ____ Stop metformin 2 days prior to surgery    ____ Take 1/2 of usual insulin dose the night before surgery and none on the morning of  surgery.   ____ Stop  Coumadin/Plavix/aspirin on does not apply.  _x___ Stop Anti-inflammatories such as Advil, Aleve, Ibuprofen, Motrin, Naproxen, Naprosyn, Goodies powders or aspirin products. OK to take Tylenol.   ____ Stop supplements until after surgery.    ____ Bring C-Pap to the hospital.

## 2017-02-24 NOTE — Pre-Procedure Instructions (Signed)
Pt's cardiac clearance from Dr. Saralyn Pilar is in the front of chart.

## 2017-02-24 NOTE — Pre-Procedure Instructions (Signed)
Incentive Spirometer instructions gone over with pt, he returned correct demo.

## 2017-02-25 ENCOUNTER — Ambulatory Visit: Payer: 59 | Admitting: Anesthesiology

## 2017-02-25 ENCOUNTER — Encounter
Admission: RE | Disposition: A | Discharge status: Home / Self Care | Payer: Self-pay | Source: Ambulatory Visit | Attending: Podiatry

## 2017-02-25 ENCOUNTER — Encounter: Payer: Self-pay | Admitting: *Deleted

## 2017-02-25 ENCOUNTER — Ambulatory Visit
Admission: RE | Admit: 2017-02-25 | Discharge: 2017-02-25 | Disposition: A | Discharge status: Home / Self Care | Payer: 59 | Source: Ambulatory Visit | Attending: Podiatry | Admitting: Podiatry

## 2017-02-25 DIAGNOSIS — Z7982 Long term (current) use of aspirin: Secondary | ICD-10-CM | POA: Insufficient documentation

## 2017-02-25 DIAGNOSIS — Z792 Long term (current) use of antibiotics: Secondary | ICD-10-CM | POA: Insufficient documentation

## 2017-02-25 DIAGNOSIS — I10 Essential (primary) hypertension: Secondary | ICD-10-CM | POA: Insufficient documentation

## 2017-02-25 DIAGNOSIS — Z87891 Personal history of nicotine dependence: Secondary | ICD-10-CM | POA: Insufficient documentation

## 2017-02-25 DIAGNOSIS — I96 Gangrene, not elsewhere classified: Secondary | ICD-10-CM | POA: Insufficient documentation

## 2017-02-25 DIAGNOSIS — E1152 Type 2 diabetes mellitus with diabetic peripheral angiopathy with gangrene: Secondary | ICD-10-CM | POA: Diagnosis not present

## 2017-02-25 DIAGNOSIS — Z7984 Long term (current) use of oral hypoglycemic drugs: Secondary | ICD-10-CM | POA: Insufficient documentation

## 2017-02-25 DIAGNOSIS — Z7951 Long term (current) use of inhaled steroids: Secondary | ICD-10-CM | POA: Insufficient documentation

## 2017-02-25 DIAGNOSIS — Z79899 Other long term (current) drug therapy: Secondary | ICD-10-CM | POA: Insufficient documentation

## 2017-02-25 HISTORY — PX: AMPUTATION TOE: SHX6595

## 2017-02-25 LAB — GLUCOSE, CAPILLARY
Glucose-Capillary: 169 mg/dL — ABNORMAL HIGH (ref 65–99)
Glucose-Capillary: 163 mg/dL — ABNORMAL HIGH (ref 65–99)

## 2017-02-25 SURGERY — AMPUTATION, TOE
Anesthesia: General | Site: Toe | Laterality: Left | Wound class: Dirty or Infected

## 2017-02-25 MED ORDER — IPRATROPIUM-ALBUTEROL 0.5-2.5 (3) MG/3ML IN SOLN
3.0000 mL | RESPIRATORY_TRACT | Status: DC | PRN
  Administered 2017-02-25: 3 mL via RESPIRATORY_TRACT

## 2017-02-25 MED ORDER — GLYCOPYRROLATE 0.2 MG/ML IJ SOLN
INTRAMUSCULAR | Status: DC | PRN
  Administered 2017-02-25: 0.2 mg via INTRAVENOUS

## 2017-02-25 MED ORDER — BUPIVACAINE HCL 0.5 % IJ SOLN
INTRAMUSCULAR | Status: DC | PRN
  Administered 2017-02-25: 5 mL

## 2017-02-25 MED ORDER — FAMOTIDINE 20 MG PO TABS
20.0000 mg | ORAL_TABLET | Freq: Once | ORAL | Status: AC
  Administered 2017-02-25: 20 mg via ORAL

## 2017-02-25 MED ORDER — PROPOFOL 10 MG/ML IV BOLUS
INTRAVENOUS | Status: AC
  Filled 2017-02-25: qty 20

## 2017-02-25 MED ORDER — IPRATROPIUM-ALBUTEROL 0.5-2.5 (3) MG/3ML IN SOLN
RESPIRATORY_TRACT | Status: AC
  Filled 2017-02-25: qty 3

## 2017-02-25 MED ORDER — PHENYLEPHRINE HCL 10 MG/ML IJ SOLN
INTRAMUSCULAR | Status: DC | PRN
  Administered 2017-02-25: 100 ug via INTRAVENOUS

## 2017-02-25 MED ORDER — FAMOTIDINE 20 MG PO TABS
ORAL_TABLET | ORAL | Status: AC
  Filled 2017-02-25: qty 1

## 2017-02-25 MED ORDER — ONDANSETRON HCL 4 MG/2ML IJ SOLN: 4.0000 mg | Freq: Four times a day (QID) | INTRAMUSCULAR | Status: DC | PRN

## 2017-02-25 MED ORDER — ONDANSETRON HCL 4 MG/2ML IJ SOLN: 4.0000 mg | Freq: Once | INTRAMUSCULAR | Status: DC | PRN

## 2017-02-25 MED ORDER — ONDANSETRON HCL 4 MG/2ML IJ SOLN
INTRAMUSCULAR | Status: DC | PRN
  Administered 2017-02-25: 4 mg via INTRAVENOUS

## 2017-02-25 MED ORDER — HYDROCODONE-ACETAMINOPHEN 5-325 MG PO TABS: 1.0000 | ORAL_TABLET | Freq: Four times a day (QID) | ORAL | 0 refills | Status: DC | PRN

## 2017-02-25 MED ORDER — PROPOFOL 500 MG/50ML IV EMUL: INTRAVENOUS | Status: DC | PRN

## 2017-02-25 MED ORDER — MIDAZOLAM HCL 2 MG/2ML IJ SOLN
INTRAMUSCULAR | Status: DC | PRN
  Administered 2017-02-25: 2 mg via INTRAVENOUS

## 2017-02-25 MED ORDER — PROPOFOL 500 MG/50ML IV EMUL
INTRAVENOUS | Status: DC | PRN
  Administered 2017-02-25: 25 ug via INTRAVENOUS
  Administered 2017-02-25: 100 ug via INTRAVENOUS
  Administered 2017-02-25: 10 ug via INTRAVENOUS

## 2017-02-25 MED ORDER — CLINDAMYCIN PHOSPHATE 900 MG/50ML IV SOLN
INTRAVENOUS | Status: AC
  Filled 2017-02-25: qty 50

## 2017-02-25 MED ORDER — FENTANYL CITRATE (PF) 100 MCG/2ML IJ SOLN: 25.0000 ug | INTRAMUSCULAR | Status: DC | PRN

## 2017-02-25 MED ORDER — CHLORHEXIDINE GLUCONATE 4 % EX LIQD: 60.0000 mL | Freq: Once | CUTANEOUS | Status: DC

## 2017-02-25 MED ORDER — MIDAZOLAM HCL 2 MG/2ML IJ SOLN
INTRAMUSCULAR | Status: AC
  Filled 2017-02-25: qty 2

## 2017-02-25 MED ORDER — BUPIVACAINE HCL (PF) 0.5 % IJ SOLN
INTRAMUSCULAR | Status: AC
  Filled 2017-02-25: qty 30

## 2017-02-25 MED ORDER — POVIDONE-IODINE 7.5 % EX SOLN: Freq: Once | CUTANEOUS | Status: DC

## 2017-02-25 MED ORDER — ONDANSETRON HCL 4 MG PO TABS: 4.0000 mg | ORAL_TABLET | Freq: Four times a day (QID) | ORAL | Status: DC | PRN

## 2017-02-25 MED ORDER — LIDOCAINE HCL (PF) 1 % IJ SOLN
INTRAMUSCULAR | Status: DC | PRN
  Administered 2017-02-25: 5 mL

## 2017-02-25 MED ORDER — SODIUM CHLORIDE 0.9 % IV SOLN
INTRAVENOUS | Status: DC
  Administered 2017-02-25 (×2): via INTRAVENOUS

## 2017-02-25 MED ORDER — LIDOCAINE HCL (PF) 1 % IJ SOLN
INTRAMUSCULAR | Status: AC
  Filled 2017-02-25: qty 30

## 2017-02-25 MED ORDER — FENTANYL CITRATE (PF) 100 MCG/2ML IJ SOLN
INTRAMUSCULAR | Status: AC
  Filled 2017-02-25: qty 2

## 2017-02-25 MED ORDER — OXYCODONE-ACETAMINOPHEN 5-325 MG PO TABS: 1.0000 | ORAL_TABLET | ORAL | Status: DC | PRN

## 2017-02-25 SURGICAL SUPPLY — 39 items
BANDAGE ELASTIC 4 LF NS (GAUZE/BANDAGES/DRESSINGS) ×2 IMPLANT
BANDAGE STRETCH 3X4.1 STRL (GAUZE/BANDAGES/DRESSINGS) ×4 IMPLANT
BLADE OSC/SAGITTAL MD 5.5X18 (BLADE) ×2 IMPLANT
BLADE SURG MINI STRL (BLADE) ×2 IMPLANT
BNDG ESMARK 4X12 TAN STRL LF (GAUZE/BANDAGES/DRESSINGS) ×2 IMPLANT
BNDG GAUZE 4.5X4.1 6PLY STRL (MISCELLANEOUS) ×2 IMPLANT
CANISTER SUCT 1200ML W/VALVE (MISCELLANEOUS) ×2 IMPLANT
DRAPE FLUOR MINI C-ARM 54X84 (DRAPES) ×2 IMPLANT
DRAPE XRAY CASSETTE 23X24 (DRAPES) ×2 IMPLANT
DURAPREP 26ML APPLICATOR (WOUND CARE) ×2 IMPLANT
ELECT REM PT RETURN 9FT ADLT (ELECTROSURGICAL) ×2
ELECTRODE REM PT RTRN 9FT ADLT (ELECTROSURGICAL) ×1 IMPLANT
GAUZE PACKING IODOFORM 1/2 (PACKING) ×2 IMPLANT
GAUZE PETRO XEROFOAM 1X8 (MISCELLANEOUS) ×2 IMPLANT
GAUZE SPONGE 4X4 12PLY STRL (GAUZE/BANDAGES/DRESSINGS) ×2 IMPLANT
GAUZE STRETCH 2X75IN STRL (MISCELLANEOUS) ×2 IMPLANT
GLOVE BIO SURGEON STRL SZ7.5 (GLOVE) ×2 IMPLANT
GLOVE INDICATOR 8.0 STRL GRN (GLOVE) ×2 IMPLANT
GOWN STRL REUS W/ TWL LRG LVL3 (GOWN DISPOSABLE) ×2 IMPLANT
GOWN STRL REUS W/TWL LRG LVL3 (GOWN DISPOSABLE) ×2
HANDPIECE INTERPULSE COAX TIP (DISPOSABLE) ×1
KIT RM TURNOVER STRD PROC AR (KITS) ×2 IMPLANT
LABEL OR SOLS (LABEL) ×2 IMPLANT
NEEDLE FILTER BLUNT 18X 1/2SAF (NEEDLE) ×1
NEEDLE FILTER BLUNT 18X1 1/2 (NEEDLE) ×1 IMPLANT
NEEDLE HYPO 25X1 1.5 SAFETY (NEEDLE) ×2 IMPLANT
NS IRRIG 500ML POUR BTL (IV SOLUTION) ×2 IMPLANT
PACK EXTREMITY ARMC (MISCELLANEOUS) ×2 IMPLANT
PAD ABD DERMACEA PRESS 5X9 (GAUZE/BANDAGES/DRESSINGS) ×4 IMPLANT
SET HNDPC FAN SPRY TIP SCT (DISPOSABLE) ×1 IMPLANT
SOL .9 NS 3000ML IRR  AL (IV SOLUTION) ×1
SOL .9 NS 3000ML IRR UROMATIC (IV SOLUTION) ×1 IMPLANT
STOCKINETTE M/LG 89821 (MISCELLANEOUS) ×2 IMPLANT
STRAP SAFETY BODY (MISCELLANEOUS) ×2 IMPLANT
SUT ETHILON 3-0 FS-10 30 BLK (SUTURE) ×2
SUT ETHILON 5-0 FS-2 18 BLK (SUTURE) ×2 IMPLANT
SUT VIC AB 4-0 FS2 27 (SUTURE) ×2 IMPLANT
SUTURE EHLN 3-0 FS-10 30 BLK (SUTURE) ×1 IMPLANT
SYRINGE 10CC LL (SYRINGE) ×6 IMPLANT

## 2017-02-25 NOTE — Op Note (Signed)
Operative note   Surgeon:Analea Muller Lawyer: None    Preop diagnosis: Gangrene left second toe    Postop diagnosis: Same    Procedure: Amputation left second toe MTPJ    EBL: Minimal    Anesthesia:local and general    Hemostasis: None    Specimen: Gangrenous second toe    Complications: None    Operative indications:[name] is an 66 y.o. that presents today for surgical intervention.  The risks/benefits/alternatives/complications have been discussed and consent has been given.    Procedure:  Patient was brought into the OR and placed on the operating table in thesupine position. After anesthesia was obtained theleft lower extremity was prepped and draped in usual sterile fashion.  Attention was directed to the left second toe where 2 semielliptical incisions were made at the metatarsal phalangeal joint. Full-thickness incision was taken down to the joint. The toe was then disarticulated at the MTPJ. The wound was flushed with copious amounts of irrigation. Closure was performed with a 4-0 nylon. A well compressive sterile bulky dressing was applied.    Patient tolerated the procedure and anesthesia well.  Was transported from the OR to the PACU with all vital signs stable and vascular status intact. To be discharged per routine protocol.  Will follow up in approximately 1 week in the outpatient clinic.

## 2017-02-25 NOTE — Anesthesia Post-op Follow-up Note (Cosign Needed)
Anesthesia QCDR form completed.        

## 2017-02-25 NOTE — H&P (Signed)
HISTORY AND PHYSICAL INTERVAL NOTE:  02/25/2017  1:02 PM  [name]  has presented today for surgery, with the diagnosis of Toe ulcer L97.523.  The various methods of treatment have been discussed with the patient.  No guarantees were given.  After consideration of risks, benefits and other options for treatment, the patient has consented to surgery.  I have reviewed the patients' chart and labs.    Patient Vitals for the past 24 hrs:  BP Temp Temp src Pulse Resp SpO2  02/25/17 1207 104/62 98.2 F (36.8 C) Oral 61 20 99 %    A history and physical examination was performed in my office.  The patient was reexamined.  There have been no changes to this history and physical examination.  Samara Deist A

## 2017-02-25 NOTE — Discharge Instructions (Signed)
[facility] °[facility] ° °POST OPERATIVE INSTRUCTIONS FOR DR. TROXLER AND DR. FOWLER °[facility] ° ° °1. Take your medication as prescribed.  Pain medication should be taken only as needed. ° °2. Keep the dressing clean, dry and intact. ° °3. Keep your foot elevated above the heart level for the first 48 hours. ° °4. Walking to the bathroom and brief periods of walking are acceptable, unless we have instructed you to be non-weight bearing. ° °5. Always wear your post-op shoe when walking.  Always use your crutches if you are to be non-weight bearing. ° °6. Do not take a shower. Baths are permissible as long as the foot is kept out of the water.  ° °7. Every hour you are awake:  °- Bend your knee 15 times. °- Flex foot 15 times °- Massage calf 15 times ° °8. Call [facility] ([phone]) if any of the following problems occur: °- You develop a temperature or fever. °- The bandage becomes saturated with blood. °- Medication does not stop your pain. °- Injury of the foot occurs. °- Any symptoms of infection including redness, odor, or red streaks running from wound. ° ° ° ° °AMBULATORY SURGERY  °DISCHARGE INSTRUCTIONS ° ° °1) The drugs that you were given will stay in your system until tomorrow so for the next 24 hours you should not: ° °A) Drive an automobile °B) Make any legal decisions °C) Drink any alcoholic beverage ° ° °2) You may resume regular meals tomorrow.  Today it is better to start with liquids and gradually work up to solid foods. ° °You may eat anything you prefer, but it is better to start with liquids, then soup and crackers, and gradually work up to solid foods. ° ° °3) Please notify your doctor immediately if you have any unusual bleeding, trouble breathing, redness and pain at the surgery site, drainage, fever, or pain not relieved by medication. ° ° ° °4) Additional Instructions: ° ° ° ° ° ° ° °Please contact your physician with any  problems or Same Day Surgery at [phone], Monday through Friday 6 am to 4 pm, or [place] [facility] [place] Main number at [phone]. ° °

## 2017-02-25 NOTE — Anesthesia Procedure Notes (Signed)
Procedure Name: LMA Insertion Date/Time: 02/25/2017 1:34 PM Performed by: [facility] Pre-anesthesia Checklist: Patient identified, Patient being monitored, Timeout performed, Emergency Drugs available and Suction available Patient Re-evaluated:Patient Re-evaluated prior to inductionOxygen Delivery Method: Circle system utilized Preoxygenation: Pre-oxygenation with 100% oxygen Intubation Type: IV induction Ventilation: Mask ventilation without difficulty LMA: LMA inserted Tube type: Oral Number of attempts: 1 Placement Confirmation: positive ETCO2 and breath sounds checked- equal and bilateral Tube secured with: Tape Dental Injury: Teeth and Oropharynx as per pre-operative assessment

## 2017-02-25 NOTE — Anesthesia Preprocedure Evaluation (Signed)
Anesthesia Evaluation  Patient identified by MRN, date of birth, ID band Patient awake    Reviewed: Allergy & Precautions, H&P , NPO status , Patient's Chart, lab work & pertinent test results, reviewed documented beta blocker date and time   Airway Mallampati: II   Neck ROM: full    Dental  (+) Poor Dentition   Pulmonary neg pulmonary ROS, shortness of breath and with exertion, former smoker,    Pulmonary exam normal        Cardiovascular hypertension, negative cardio ROS Normal cardiovascular exam Rhythm:regular Rate:Normal     Neuro/Psych negative neurological ROS  negative psych ROS   GI/Hepatic negative GI ROS, Neg liver ROS,   Endo/Other  negative endocrine ROSdiabetes  Renal/GU Renal diseasenegative Renal ROS  negative genitourinary   Musculoskeletal   Abdominal   Peds  Hematology negative hematology ROS (+)   Anesthesia Other Findings Past Medical History: No date: Diabetes mellitus without complication (HCC) No date: Dyspnea 1992: Fracture of clavicle     Comment: left  1992: Fracture of ribs, multiple     Comment: 3 ribs No date: Hypertension No date: Paroxysmal SVT (supraventricular tachycardia) * 1992: Pelvis fracture ([place]) No date: Renal insufficiency Past Surgical History: 2008: BACK SURGERY No date: CHOLECYSTECTOMY No date: FOOT SURGERY Right     Comment: x 3   Reproductive/Obstetrics negative OB ROS                             Anesthesia Physical Anesthesia Plan  ASA: III  Anesthesia Plan: General   Post-op Pain Management:    Induction:   Airway Management Planned:   Additional Equipment:   Intra-op Plan:   Post-operative Plan:   Informed Consent: I have reviewed the patients History and Physical, chart, labs and discussed the procedure including the risks, benefits and alternatives for the proposed anesthesia with the patient or authorized  representative who has indicated his/her understanding and acceptance.   Dental Advisory Given  Plan Discussed with: CRNA  Anesthesia Plan Comments:         Anesthesia Quick Evaluation

## 2017-02-25 NOTE — Transfer of Care (Signed)
Immediate Anesthesia Transfer of Care Note  Patient: [name]  Procedure(s) Performed: Procedure(s): AMPUTATION TOE-LEFT 2ND MPJ (Left)  Patient Location: PACU  Anesthesia Type:General  Level of Consciousness: sedated  Airway & Oxygen Therapy: Patient Spontanous Breathing  Post-op Assessment: Report given to RN and Post -op Vital signs reviewed and stable  Post vital signs: Reviewed and stable  Last Vitals:  Vitals:   02/25/17 1207 02/25/17 1401  BP: 104/62 109/85  Pulse: 61 78  Resp: 20 (!) 27  Temp: 36.8 C     Last Pain:  Vitals:   02/25/17 1207  TempSrc: Oral         Complications: No apparent anesthesia complications

## 2017-02-28 ENCOUNTER — Encounter: Payer: Self-pay | Admitting: Podiatry

## 2017-02-28 NOTE — Anesthesia Postprocedure Evaluation (Signed)
Anesthesia Post Note  Patient: [name]  Procedure(s) Performed: Procedure(s) (LRB): AMPUTATION TOE-LEFT 2ND MPJ (Left)  Patient location during evaluation: PACU Anesthesia Type: General Level of consciousness: awake and alert Pain management: pain level controlled Vital Signs Assessment: post-procedure vital signs reviewed and stable Respiratory status: spontaneous breathing, nonlabored ventilation, respiratory function stable and patient connected to nasal cannula oxygen Cardiovascular status: blood pressure returned to baseline and stable Postop Assessment: no signs of nausea or vomiting Anesthetic complications: no     Last Vitals:  Vitals:   02/25/17 1450 02/25/17 1517  BP: 105/84 122/69  Pulse: 76 77  Resp: 17 16  Temp: 36.2 C     Last Pain:  Vitals:   02/28/17 0924  TempSrc:   PainSc: 0-No pain                 Molli Barrows

## 2017-03-01 LAB — SURGICAL PATHOLOGY

## 2017-03-10 ENCOUNTER — Other Ambulatory Visit (INDEPENDENT_AMBULATORY_CARE_PROVIDER_SITE_OTHER): Payer: Self-pay | Admitting: Vascular Surgery

## 2017-03-10 DIAGNOSIS — I70219 Atherosclerosis of native arteries of extremities with intermittent claudication, unspecified extremity: Secondary | ICD-10-CM

## 2017-03-11 ENCOUNTER — Ambulatory Visit (INDEPENDENT_AMBULATORY_CARE_PROVIDER_SITE_OTHER): Payer: 59 | Admitting: Vascular Surgery

## 2017-03-11 ENCOUNTER — Encounter (INDEPENDENT_AMBULATORY_CARE_PROVIDER_SITE_OTHER): Payer: Self-pay | Admitting: Vascular Surgery

## 2017-03-11 ENCOUNTER — Other Ambulatory Visit (INDEPENDENT_AMBULATORY_CARE_PROVIDER_SITE_OTHER): Payer: Self-pay | Admitting: Vascular Surgery

## 2017-03-11 ENCOUNTER — Ambulatory Visit (INDEPENDENT_AMBULATORY_CARE_PROVIDER_SITE_OTHER): Payer: 59

## 2017-03-11 ENCOUNTER — Other Ambulatory Visit (INDEPENDENT_AMBULATORY_CARE_PROVIDER_SITE_OTHER): Payer: 59

## 2017-03-11 VITALS — BP 114/64 | HR 70 | Resp 16 | Ht 70.0 in | Wt 182.8 lb

## 2017-03-11 DIAGNOSIS — I739 Peripheral vascular disease, unspecified: Secondary | ICD-10-CM

## 2017-03-11 DIAGNOSIS — E118 Type 2 diabetes mellitus with unspecified complications: Secondary | ICD-10-CM

## 2017-03-11 DIAGNOSIS — I6523 Occlusion and stenosis of bilateral carotid arteries: Secondary | ICD-10-CM | POA: Diagnosis not present

## 2017-03-11 DIAGNOSIS — L97909 Non-pressure chronic ulcer of unspecified part of unspecified lower leg with unspecified severity: Secondary | ICD-10-CM | POA: Diagnosis not present

## 2017-03-15 DIAGNOSIS — I739 Peripheral vascular disease, unspecified: Secondary | ICD-10-CM | POA: Insufficient documentation

## 2017-03-15 DIAGNOSIS — I6523 Occlusion and stenosis of bilateral carotid arteries: Secondary | ICD-10-CM | POA: Insufficient documentation

## 2017-03-15 DIAGNOSIS — E118 Type 2 diabetes mellitus with unspecified complications: Secondary | ICD-10-CM

## 2017-03-15 NOTE — Progress Notes (Signed)
Subjective:    Patient ID: [name], male    DOB: 1950-10-05, 66 y.o.   MRN: [id number] Chief Complaint  Patient presents with  . New Patient (Initial Visit)   Presents as a new patient referred by Dr. Vickki Muff for evaluation of peripheral artery disease. The patient underwent amputation of his left second toe for wound development. The patient also endorses ulceration to the left big toe which has healed. The patient recently had some sutures removed from the second toe amputation site. Patient reports no issues with wound healing. He denies claudication, rest pain or new ulceration to the bilateral lower extremity. The patient was also referred to [place] by Dr. Chancy Milroy for carotid stenosis. He apparently has a history of bilateral carotid artery stenosis. The patient denies any amaurosis fugax or focal neurological deficits. The patient underwent a bilateral lower extremity ABI which was notable for right lower extremity non-compressible vessels with triphasic tibial vessels and moderate left lower extremity arterial disease with biphasic tibial vessels. The patient underwent a left lower extremity arterial duplex which was notable for biphasic blood flow noted distally. Patient denies any fever, nausea or vomiting.   Review of Systems  Constitutional: Negative.   HENT: Negative.   Eyes: Negative.   Respiratory: Negative.   Cardiovascular: Negative.   Gastrointestinal: Negative.   Endocrine: Negative.   Genitourinary: Negative.   Musculoskeletal: Negative.   Skin: Negative.   Allergic/Immunologic: Negative.   Neurological: Negative.   Hematological: Negative.   Psychiatric/Behavioral: Negative.       Objective:   Physical Exam  Constitutional: He appears well-developed and well-nourished. No distress.  HENT:  Head: Normocephalic and atraumatic.  Eyes: Conjunctivae are normal. Pupils are equal, round, and reactive to light.  Neck: Normal range of motion.  Bilateral carotid bruits  noted  Cardiovascular: Normal rate, regular rhythm, normal heart sounds and intact distal pulses.   Pulses:      Radial pulses are 2+ on the right side, and 2+ on the left side.  Bilateral pedal pulses are nonpalpable however bilateral feet are warm. Left foot: Second toe amputation site. Sutures intact. Site seems to be healing well. No other ulcerations noted.  Pulmonary/Chest: Effort normal.  Musculoskeletal: Normal range of motion. He exhibits no edema.  Neurological: He is alert.  Skin: Skin is warm and dry. He is not diaphoretic.  Psychiatric: He has a normal mood and affect. His behavior is normal. Judgment and thought content normal.  Vitals reviewed.   BP 114/64   Pulse 70   Resp 16   Ht 5\' 10"  (1.778 m)   Wt 182 lb 12.8 oz (82.9 kg)   BMI 26.23 kg/m   Past Medical History:  Diagnosis Date  . Diabetes mellitus without complication ([place])   . Dyspnea   . Fracture of clavicle 1992   left   . Fracture of ribs, multiple 1992   3 ribs  . Hypertension   . Paroxysmal SVT (supraventricular tachycardia) ([place])   . Pelvis fracture ([place]) 1992  . Renal insufficiency     Social History   Social History  . Marital status: Single    Spouse name: N/A  . Number of children: N/A  . Years of education: N/A   Occupational History  . Not on file.   Social History Main Topics  . Smoking status: Former Smoker    Quit date: 02/24/1997  . Smokeless tobacco: Never Used  . Alcohol use No  . Drug use: No  .  Sexual activity: Not on file   Other Topics Concern  . Not on file   Social History Narrative  . No narrative on file    Past Surgical History:  Procedure Laterality Date  . AMPUTATION TOE Left 02/25/2017   Procedure: AMPUTATION TOE-LEFT 2ND MPJ;  Surgeon: Samara Deist, DPM;  Location: [facility] ORS;  Service: Podiatry;  Laterality: Left;  . BACK SURGERY  2008  . CHOLECYSTECTOMY    . FOOT SURGERY Right    x 3    Family History  Problem Relation Age of Onset  .  Diabetes Sister     Allergies  Allergen Reactions  . Bactrim [Sulfamethoxazole-Trimethoprim] Other (See Comments)    Mouth sores Sores develop in mouth  . Amoxicillin Rash  . Penicillins Rash    Rash in and around the mouth       Assessment & Plan:  Presents as a new patient referred by Dr. Vickki Muff for evaluation of peripheral artery disease. The patient underwent amputation of his left second toe for wound development. The patient also endorses ulceration to the left big toe which has healed. The patient recently had some sutures removed from the second toe amputation site. Patient reports no issues with wound healing. He denies claudication, rest pain or new ulceration to the bilateral lower extremity. The patient was also referred to [place] by Dr. Chancy Milroy for carotid stenosis. He apparently has a history of bilateral carotid artery stenosis. The patient denies any amaurosis fugax or focal neurological deficits. The patient underwent a bilateral lower extremity ABI which was notable for right lower extremity non-compressible vessels with triphasic tibial vessels and moderate left lower extremity arterial disease with biphasic tibial vessels. The patient underwent a left lower extremity arterial duplex which was notable for biphasic blood flow noted distally. Patient denies any fever, nausea or vomiting  1. PAD (peripheral artery disease) ([place]) - New Patient with history of peripheral artery disease. Patient with second toe amputation. The site seems to be healing well. The patient is asymptomatic at the time. At this point, we'll plan on repeating ABI and lower arterial duplex in 6 months. Patient understands that in the future, he may need endovascular intervention. Patient to call the office sooner if ulcers / symptoms develop  - VAS [place] ABI WITH/WO TBI; Future - VAS [place] LOWER EXTREMITY ARTERIAL DUPLEX; Future  2. Bilateral carotid artery stenosis - new Patient with history of carotid artery  disease. Underwent duplex with Dr. Chancy Milroy - possible 60% stenosis, carotid bruits noted bilaterally. Patient is asymptomatic at the time. We'll bring the patient back at his convenience to undergo a bilateral carotid duplex.  - VAS US CAROTID; Future  3. Type 2 diabetes mellitus with complication, unspecified whether long term insulin use (HCC) - stable Encouraged good control as its slows the progression of atherosclerotic disease   Current Outpatient Prescriptions on File Prior to Visit  Medication Sig Dispense Refill  . aspirin EC 81 MG tablet Take 81 mg by mouth daily.    Marland Kitchen gabapentin (NEURONTIN) 100 MG capsule Take 100 mg by mouth 3 (three) times daily. 2 tablets 3 times a day.    . glyBURIDE (DIABETA) 5 MG tablet Take 2.5 mg by mouth 2 (two) times daily.    Marland Kitchen lisinopril (PRINIVIL,ZESTRIL) 5 MG tablet Take 5 mg by mouth daily. In am.    . lovastatin (MEVACOR) 10 MG tablet Take 10 mg by mouth at bedtime.    . metoprolol succinate (TOPROL-XL) 100 MG 24 hr  tablet Take 1 tablet (100 mg total) by mouth daily. Take with or immediately following a meal. 30 tablet 0  . traMADol (ULTRAM) 50 MG tablet Take 50 mg by mouth 2 (two) times daily as needed for moderate pain or severe pain.     . calcium-vitamin D (CALCIUM 500/D) 500-200 MG-UNIT tablet Take 1 tablet by mouth daily.    . ciprofloxacin (CIPRO) 500 MG tablet Take 500 mg by mouth 2 (two) times daily.    . Cyanocobalamin (VITAMIN B 12 PO) Take 1,000 mcg by mouth daily after breakfast.    . diphenhydrAMINE (BENADRYL) 25 mg capsule Take 25 mg by mouth every 6 (six) hours as needed for itching.     Marland Kitchen HYDROcodone-acetaminophen (NORCO) 5-325 MG tablet Take 1 tablet by mouth every 6 (six) hours as needed for moderate pain. (Patient not taking: Reported on 03/11/2017) 30 tablet 0  . mometasone-formoterol (DULERA) 100-5 MCG/ACT AERO Inhale 1 puff into the lungs 2 (two) times daily.     No current facility-administered medications on file prior to  visit.    There are no Patient Instructions on file for this visit. Return in about 6 months (around 09/10/2017), or if symptoms worsen or fail to improve, for Peripheral artery disease.  Lillyahna Hemberger A Zulema Pulaski, PA-C

## 2017-03-22 ENCOUNTER — Ambulatory Visit (INDEPENDENT_AMBULATORY_CARE_PROVIDER_SITE_OTHER): Payer: 59

## 2017-03-22 ENCOUNTER — Encounter (INDEPENDENT_AMBULATORY_CARE_PROVIDER_SITE_OTHER): Payer: Self-pay | Admitting: Vascular Surgery

## 2017-03-22 ENCOUNTER — Ambulatory Visit (INDEPENDENT_AMBULATORY_CARE_PROVIDER_SITE_OTHER): Payer: 59 | Admitting: Vascular Surgery

## 2017-03-22 VITALS — BP 93/63 | HR 60 | Resp 15 | Ht 70.0 in | Wt 182.0 lb

## 2017-03-22 DIAGNOSIS — I6523 Occlusion and stenosis of bilateral carotid arteries: Secondary | ICD-10-CM

## 2017-03-22 DIAGNOSIS — I739 Peripheral vascular disease, unspecified: Secondary | ICD-10-CM

## 2017-03-22 DIAGNOSIS — E118 Type 2 diabetes mellitus with unspecified complications: Secondary | ICD-10-CM

## 2017-03-22 NOTE — Patient Instructions (Signed)
Carotid Endarterectomy A carotid endarterectomy is a surgery to remove a blockage in the carotid arteries. The carotid arteries are the large blood vessels on both sides of the neck that supply blood to the brain. Carotid artery disease, also called carotid artery stenosis, is the narrowing or blockage of one or both carotid arteries. Carotid artery disease is usually caused by atherosclerosis, which is a buildup of fat and plaques in the arteries. Some buildup of plaques normally occurs with aging. The plaques may partially or totally block blood flow or cause a clot to form in the carotid arteries. This may cause a stroke. Tell a health care provider about:  Any allergies you have.  All medicines you are taking, including vitamins, herbs, eye drops, creams, and over-the-counter medicines.  Any problems you or family members have had with anesthetic medicines.  Any blood disorders you have.  Any surgeries you have had.  Any medical conditions you have, including diabetes, kidney problems, and infections.  Whether you are pregnant or may be pregnant. What are the risks? Generally, this is a safe procedure. However, problems may occur, including:  Infection.  Bleeding.  Blood clots.  Allergic reactions to medicines.  Damage to nerves near the carotid arteries. This can cause a hoarse voice or weakness of muscles your face.  Stroke.  Seizures.  Heart attack (myocardial infarction).  Narrowing of the opened blood vessel (re-stenosis) . This may require another surgery.  What happens before the procedure? Medicines  Ask your health care provider about: ? Changing or stopping your regular medicines. This is especially important if you are taking diabetes medicines or blood thinners. ? Taking medicines such as aspirin and ibuprofen. These medicines can thin your blood. Do not take these medicines before your procedure if your health care provider instructs you not to.  You may  be given antibiotic medicine to help prevent infection. Staying hydrated Follow instructions from your health care provider about hydration, which may include:  Up to 2 hours before the procedure - you may continue to drink clear liquids, such as water, clear fruit juice, black coffee, and plain tea.  Eating and drinking restrictions Follow instructions from your health care provider about eating and drinking, which may include:  8 hours before the procedure - stop eating heavy meals or foods such as meat, fried foods, or fatty foods.  6 hours before the procedure - stop eating light meals or foods, such as toast or cereal.  6 hours before the procedure - stop drinking milk or drinks that contain milk.  2 hours before the procedure - stop drinking clear liquids.  General instructions  Do not use any products that contain nicotine or tobacco, such as cigarettes and e-cigarettes. These can delay healing after surgery. If you need help quitting, ask your health care provider.  You may need to have blood tests, a test to check heart rhythm (electrocardiogram), or a test to check blood flow (angiogram).  Plan to have someone take you home from the hospital or clinic.  Ask your health care provider how your surgical site will be marked or identified.  You may be asked to shower with a germ-killing soap. What happens during the procedure?  To lower your risk of infection: ? Your health care team will wash or sanitize their hands. ? Your skin will be washed with soap. ? Hair may be removed from the surgical area.  An IV tube will be inserted into one of your veins.  You   will be given one or more of the following: ? A medicine to help you relax (sedative). ? A medicine to make you fall asleep (general anesthetic).  The surgeon will make a small incision in your neck to expose the carotid artery.  A tube may be inserted into the carotid artery above and below the blockage. This tube  will temporarily allow blood to flow around the blockage during the surgery.  An incision will be made in the carotid artery at the location of the blockage, then the blockage will be removed. In some cases, a section of the carotid artery may be removed and a graft patch may be used to repair the artery.  The carotid artery will be closed with stitches (sutures).  If a tube was inserted into the artery to allow blood flow around the blockage during surgery, the tube will be removed. With the tube removed, blood flow to the brain will be restored through the carotid artery.  The incision in the neck will be closed with sutures.  A bandage (dressing) will be placed over your incision. The procedure may vary among health care providers and hospitals. What happens after the procedure?  Your blood pressure, heart rate, breathing rate, and blood oxygen level will be monitored until the medicines you were given have worn off.  You may have some pain or an ache in your neck for a couple weeks. This is normal.  Do not drive for 24 hours if you were given a sedative. Summary  A carotid endarterectomy is a surgery to remove a blockage in the carotid arteries.  The carotid arteries are the large blood vessels on both sides of the neck that supply blood to the brain.  Before the procedure, ask your health care provider about changing or stopping your regular medicines.  Follow instructions from your health care provider about eating and drinking before the procedure.  After the procedure, do not drive for 24 hours if you were given a sedative. This information is not intended to replace advice given to you by your health care provider. Make sure you discuss any questions you have with your health care provider. Document Released: 05/16/2013 Document Revised: 08/02/2016 Document Reviewed: 08/02/2016 Elsevier Interactive Patient Education  2017 [employer]  

## 2017-03-22 NOTE — Assessment & Plan Note (Signed)
Recently checked. No current intervention necessary.

## 2017-03-22 NOTE — Progress Notes (Addendum)
MRN : [id number]  [name] is a 66 y.o. (01-Dec-1950) male who presents with chief complaint of  Chief Complaint  Patient presents with  . Carotid    U/S follow up  .  History of Present Illness: Patient returns today in follow up of his carotid disease.  He is doing well and has no new focal neurologic symptoms.  Specifically, the patient denies amaurosis fugax, speech or swallowing difficulties, or arm or leg weakness or numbness. His duplex today showed markedly elevated velocities at the right carotid bifurcation consistent with a high-grade stenosis in the 80-99% range. On the left, his carotid bifurcation velocities fallen the upper end of the 60-79% range.  Current Outpatient Prescriptions  Medication Sig Dispense Refill  . albuterol (PROVENTIL HFA;VENTOLIN HFA) 108 (90 Base) MCG/ACT inhaler Inhale into the lungs every 6 (six) hours as needed for wheezing or shortness of breath.    Marland Kitchen aspirin EC 81 MG tablet Take 81 mg by mouth daily.    . Calcium Carb-Cholecalciferol (CALCIUM-VITAMIN D) 500-200 MG-UNIT tablet Take by mouth.    . calcium-vitamin D (CALCIUM 500/D) 500-200 MG-UNIT tablet Take 1 tablet by mouth daily.    . ciprofloxacin (CIPRO) 500 MG tablet Take 500 mg by mouth 2 (two) times daily.    . Cyanocobalamin (VITAMIN B 12 PO) Take 1,000 mcg by mouth daily after breakfast.    . dapagliflozin propanediol (FARXIGA) 5 MG TABS tablet Take 5 mg by mouth daily.    . diphenhydrAMINE (BENADRYL) 25 mg capsule Take 25 mg by mouth every 6 (six) hours as needed for itching.     . etodolac (LODINE) 400 MG tablet Take 400 mg by mouth 2 (two) times daily.    Marland Kitchen gabapentin (NEURONTIN) 100 MG capsule Take 100 mg by mouth 3 (three) times daily. 2 tablets 3 times a day.    . glyBURIDE (DIABETA) 5 MG tablet Take 2.5 mg by mouth 2 (two) times daily.    Marland Kitchen HYDROcodone-acetaminophen (NORCO) 5-325 MG tablet Take 1 tablet by mouth every 6 (six) hours as needed for moderate pain. 30 tablet 0  .  hydrocortisone 2.5 % cream Apply topically 2 (two) times daily.    Marland Kitchen lisinopril (PRINIVIL,ZESTRIL) 5 MG tablet Take 5 mg by mouth daily. In am.    . lovastatin (MEVACOR) 10 MG tablet Take 10 mg by mouth at bedtime.    . metoprolol succinate (TOPROL-XL) 100 MG 24 hr tablet Take 1 tablet (100 mg total) by mouth daily. Take with or immediately following a meal. 30 tablet 0  . mometasone-formoterol (DULERA) 100-5 MCG/ACT AERO Inhale 1 puff into the lungs 2 (two) times daily.    Marland Kitchen tiZANidine (ZANAFLEX) 2 MG tablet Take by mouth every 6 (six) hours as needed for muscle spasms.    . traMADol (ULTRAM) 50 MG tablet Take 50 mg by mouth 2 (two) times daily as needed for moderate pain or severe pain.      No current facility-administered medications for this visit.     Past Medical History:  Diagnosis Date  . Diabetes mellitus without complication ([place])   . Dyspnea   . Fracture of clavicle 1992   left   . Fracture of ribs, multiple 1992   3 ribs  . Hypertension   . Paroxysmal SVT (supraventricular tachycardia) ([place])   . Pelvis fracture ([place]) 1992  . Renal insufficiency     Past Surgical History:  Procedure Laterality Date  . AMPUTATION TOE Left 02/25/2017  Procedure: AMPUTATION TOE-LEFT 2ND MPJ;  Surgeon: Samara Deist, DPM;  Location: [facility] ORS;  Service: Podiatry;  Laterality: Left;  . BACK SURGERY  2008  . CHOLECYSTECTOMY    . FOOT SURGERY Right    x 3    Social History Social History  Substance Use Topics  . Smoking status: Former Smoker    Quit date: 02/24/1997  . Smokeless tobacco: Never Used  . Alcohol use No    Family History Family History  Problem Relation Age of Onset  . Diabetes Sister     Allergies  Allergen Reactions  . Bactrim [Sulfamethoxazole-Trimethoprim] Other (See Comments)    Mouth sores Sores develop in mouth  . Amoxicillin Rash  . Penicillins Rash    Rash in and around the mouth     REVIEW OF SYSTEMS (Negative unless checked)  Constitutional:  [] Weight loss  [] Fever  [] Chills Cardiac: [] Chest pain   [] Chest pressure   [x] Palpitations   [] Shortness of breath when laying flat   [] Shortness of breath at rest   [] Shortness of breath with exertion. Vascular:  [] Pain in legs with walking   [] Pain in legs at rest   [] Pain in legs when laying flat   [] Claudication   [] Pain in feet when walking  [] Pain in feet at rest  [] Pain in feet when laying flat   [] History of DVT   [] Phlebitis   [] Swelling in legs   [] Varicose veins   [x] Non-healing ulcers Pulmonary:   [] Uses home oxygen   [] Productive cough   [] Hemoptysis   [] Wheeze  [] COPD   [] Asthma Neurologic:  [] Dizziness  [] Blackouts   [] Seizures   [] History of stroke   [] History of TIA  [] Aphasia   [] Temporary blindness   [] Dysphagia   [] Weakness or numbness in arms   [] Weakness or numbness in legs Musculoskeletal:  [x] Arthritis   [] Joint swelling   [] Joint pain   [] Low back pain Hematologic:  [] Easy bruising  [] Easy bleeding   [] Hypercoagulable state   [] Anemic  [] Hepatitis Gastrointestinal:  [] Blood in stool   [] Vomiting blood  [] Gastroesophageal reflux/heartburn   [] Difficulty swallowing. Genitourinary:  [] Chronic kidney disease   [] Difficult urination  [] Frequent urination  [] Burning with urination   [] Blood in urine Skin:  [] Rashes   [x] Ulcers   [x] Wounds Psychological:  [] History of anxiety   []  History of major depression.  Physical Examination  Vitals:   03/22/17 1146 03/22/17 1147  BP: 113/61 93/63  Pulse: 63 60  Resp: 15   Weight: 182 lb (82.6 kg)   Height: 5\' 10"  (1.778 m)    Body mass index is 26.11 kg/m. Gen:  WD/WN, NAD Head: [place]/AT, No temporalis wasting. Ear/Nose/Throat: Hearing grossly intact, nares w/o erythema or drainage, trachea midline Eyes: Conjunctiva clear. Sclera non-icteric Neck: Supple.  No JVD. Bilateral carotid bruits present Pulmonary:  Good air movement, equal and clear to auscultation bilaterally.  Cardiac: RRR, normal S1, S2, no Murmurs, rubs or  gallops. Vascular:  Vessel Right Left  Radial Palpable Palpable                                   Gastrointestinal: soft, non-tender/non-distended.  Musculoskeletal: M/S 5/5 throughout.  No deformity or atrophy.  Neurologic: CN 2-12 intact. Sensation grossly intact in extremities.  Symmetrical.  Speech is fluent. Motor exam as listed above. Psychiatric: Judgment intact, Mood & affect appropriate for pt's clinical situation. Dermatologic: No rashes or ulcers noted.  No  cellulitis or open wounds.      CBC Lab Results  Component Value Date   WBC 4.7 01/04/2017   HGB 15.2 01/04/2017   HCT 43.8 01/04/2017   MCV 87.6 01/04/2017   PLT 144 (L) 01/04/2017    BMET    Component Value Date/Time   NA 135 01/04/2017 0808   NA 136 04/17/2014 1026   K 4.5 01/04/2017 0808   K 5.1 04/17/2014 1026   CL 99 (L) 01/04/2017 0808   CL 103 04/17/2014 1026   CO2 30 01/04/2017 0808   CO2 28 04/17/2014 1026   GLUCOSE 140 (H) 01/04/2017 0808   GLUCOSE 124 (H) 04/17/2014 1026   BUN 31 (H) 01/04/2017 0808   BUN 28 (H) 04/17/2014 1026   CREATININE 1.15 01/04/2017 0808   CREATININE 1.30 04/17/2014 1026   CALCIUM 9.4 01/04/2017 0808   CALCIUM 8.6 04/17/2014 1026   GFRNONAA >60 01/04/2017 0808   GFRNONAA 59 (L) 04/17/2014 1026   GFRAA >60 01/04/2017 0808   GFRAA >60 04/17/2014 1026   CrCl cannot be calculated (Patient's most recent lab result is older than the maximum 21 days allowed.).  COAG No results found for: INR, PROTIME  Radiology No results found.    Assessment/Plan Type 2 diabetes mellitus with complication (HCC) blood glucose control important in reducing the progression of atherosclerotic disease. Also, involved in wound healing. On appropriate medications.   PAD (peripheral artery disease) (HCC) Recently checked. No current intervention necessary.  Bilateral carotid artery stenosis His duplex today showed markedly elevated velocities at the right carotid  bifurcation consistent with a high-grade stenosis in the 80-99% range. On the left, his carotid bifurcation velocities fallen the upper end of the 60-79% range. The patient remains asymptomatic with respect to the carotid stenosis.  However, the patient has now progressed and has a lesion the is >70%.  Patient should undergo CT angiography of the carotid arteries to define the degree of stenosis of the internal carotid arteries bilaterally and the anatomic suitability for surgery vs. intervention.  If the patient does indeed need surgery cardiac clearance will be required, once cleared the patient will be scheduled for surgery.  The risks, benefits and alternative therapies were reviewed in detail with the patient.  All questions were answered.  The patient agrees to proceed with imaging.  Continue antiplatelet therapy as prescribed. Continue management of CAD, HTN and Hyperlipidemia. Healthy heart diet, encouraged exercise at least 4 times per week.      Leotis Pain, MD  03/22/2017 12:33 PM    This note was created with Dragon medical transcription system.  Any errors from dictation are purely unintentional

## 2017-03-22 NOTE — Assessment & Plan Note (Signed)
His duplex today showed markedly elevated velocities at the right carotid bifurcation consistent with a high-grade stenosis in the 80-99% range. On the left, his carotid bifurcation velocities fallen the upper end of the 60-79% range. The patient remains asymptomatic with respect to the carotid stenosis.  However, the patient has now progressed and has a lesion the is >70%.  Patient should undergo CT angiography of the carotid arteries to define the degree of stenosis of the internal carotid arteries bilaterally and the anatomic suitability for surgery vs. intervention.  If the patient does indeed need surgery cardiac clearance will be required, once cleared the patient will be scheduled for surgery.  The risks, benefits and alternative therapies were reviewed in detail with the patient.  All questions were answered.  The patient agrees to proceed with imaging.  Continue antiplatelet therapy as prescribed. Continue management of CAD, HTN and Hyperlipidemia. Healthy heart diet, encouraged exercise at least 4 times per week.

## 2017-03-22 NOTE — Assessment & Plan Note (Signed)
blood glucose control important in reducing the progression of atherosclerotic disease. Also, involved in wound healing. On appropriate medications.  

## 2017-03-25 DIAGNOSIS — Z0001 Encounter for general adult medical examination with abnormal findings: Secondary | ICD-10-CM | POA: Insufficient documentation

## 2017-04-01 ENCOUNTER — Ambulatory Visit
Admission: RE | Admit: 2017-04-01 | Discharge: 2017-04-01 | Disposition: A | Discharge status: Home / Self Care | Payer: 59 | Source: Ambulatory Visit | Attending: Vascular Surgery | Admitting: Vascular Surgery

## 2017-04-01 DIAGNOSIS — I708 Atherosclerosis of other arteries: Secondary | ICD-10-CM | POA: Insufficient documentation

## 2017-04-01 DIAGNOSIS — I6521 Occlusion and stenosis of right carotid artery: Secondary | ICD-10-CM | POA: Diagnosis not present

## 2017-04-01 DIAGNOSIS — I6523 Occlusion and stenosis of bilateral carotid arteries: Secondary | ICD-10-CM | POA: Diagnosis present

## 2017-04-01 DIAGNOSIS — I6503 Occlusion and stenosis of bilateral vertebral arteries: Secondary | ICD-10-CM | POA: Diagnosis not present

## 2017-04-01 LAB — POCT I-STAT CREATININE: Creatinine, Ser: 1.4 mg/dL — ABNORMAL HIGH (ref 0.61–1.24)

## 2017-04-01 IMAGING — CT CT ANGIO NECK
2 of 3 series · 8 of 14 positions shown · IV contrast (APPLIED)
Comparison: None.

CLINICAL DATA: Bilateral carotid artery stenosis on Doppler
ultrasound, right greater than left. Intermittent headaches.



[Series 4: cta neck · axial · 0.43mm/px · z∈[-278,-174]mm · 2 of 156 slices shown]
[im 52/156  soft-tissue]
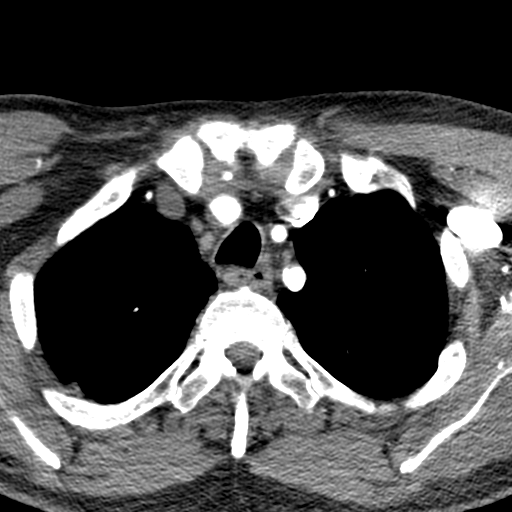
[im 104/156  soft-tissue]
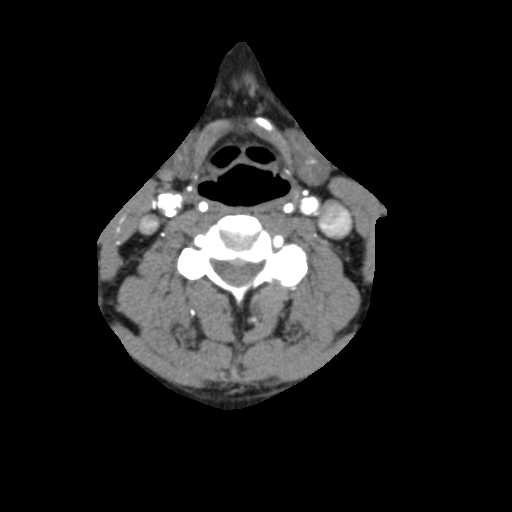

[Series 6: ax thin · axial · 0.34mm/px · z∈[-335,-113]mm · 6 of 312 slices shown]
[im 45/312  soft-tissue]
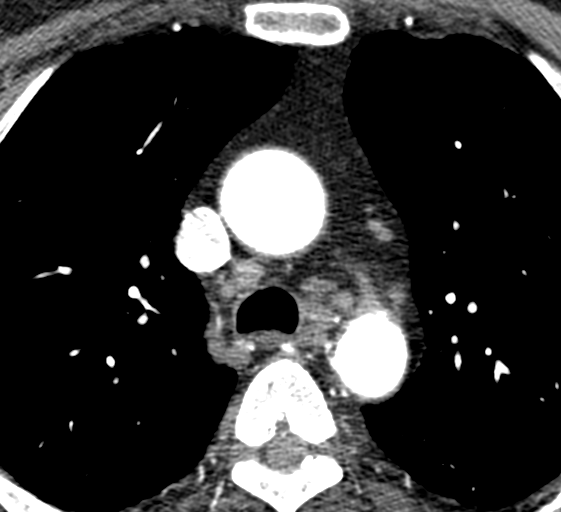
[im 89/312  bone]
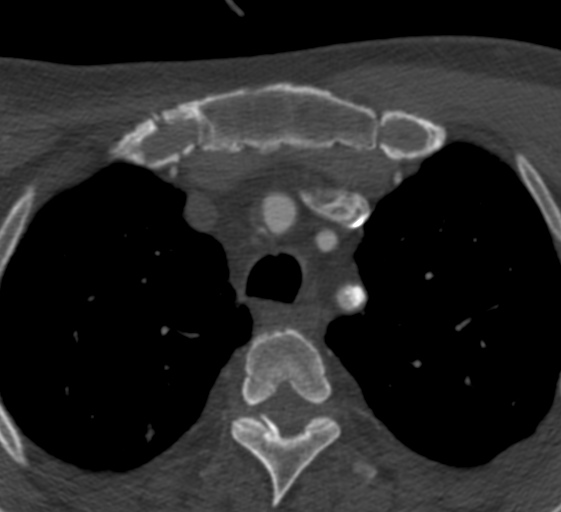
[im 134/312  soft-tissue]
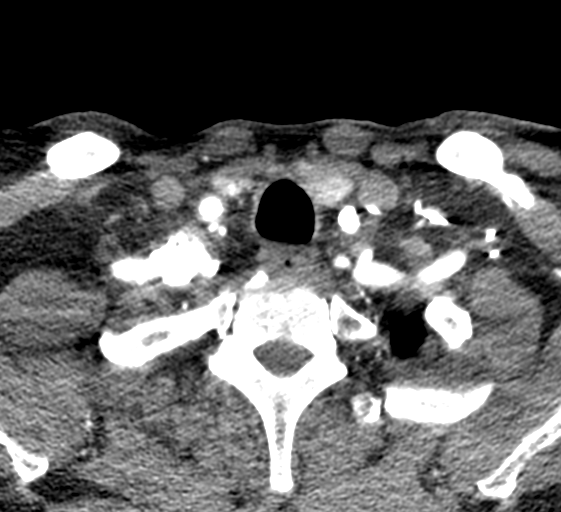
[im 178/312  bone]
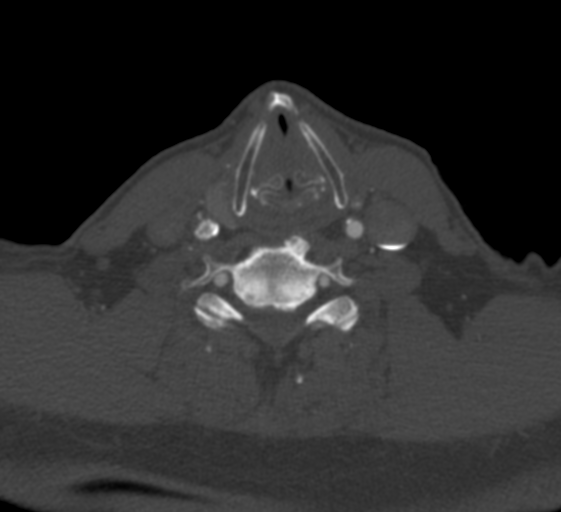
[im 223/312  soft-tissue]
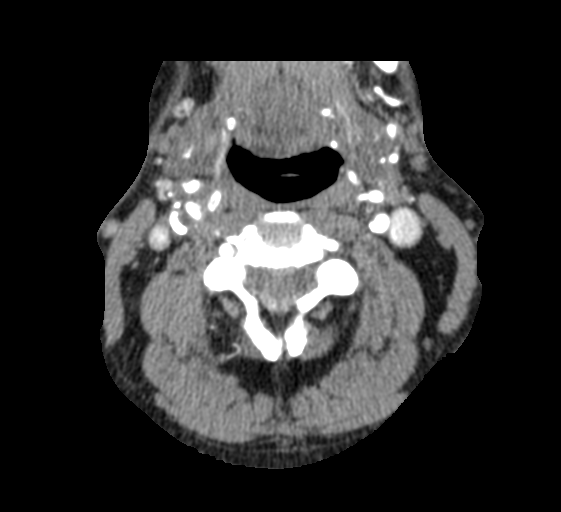
[im 267/312  bone]
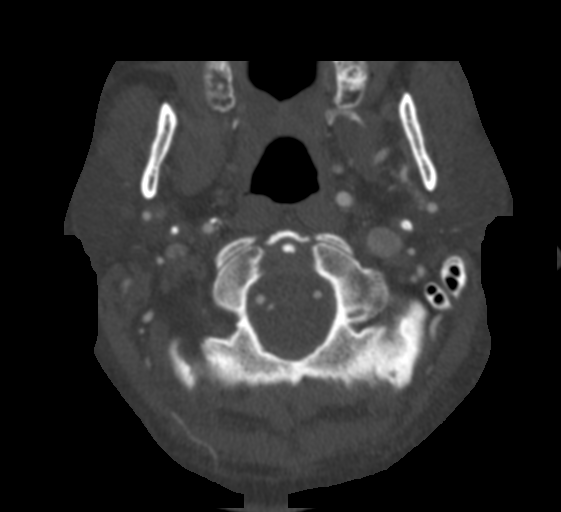

[8 of 14 positions shown; findings below may reference images not displayed]

FINDINGS: Aortic arch: Standard 3 vessel aortic arch with moderate
atherosclerotic plaque. Eccentric soft plaque in the brachiocephalic
artery does not result in significant stenosis. Calcified and soft
plaque throughout the proximal right subclavian artery results in
mild stenosis. Extensive calcified plaque in the proximal left
subclavian artery results in 65% stenosis.

Right carotid system: Calcified and soft plaque in the proximal
common carotid artery results in 50% stenosis. There is extensive,
heavily calcified plaque throughout the mid to distal common carotid
artery resulting in high-grade/critical stenosis. Accurate
quantitation of the degree of stenosis is precluded by the heavily
calcified plaque and poor visualization of the residual patent
lumen. Extensive, predominantly soft plaque in the proximal ICA does
not result in significant stenosis by NASCET criteria given the
somewhat small diameter of the more distal ICA which may be
secondary to reduced flow from the high-grade CCA stenosis. Plaque
in the more distal cervical ICA results in mild narrowing.

Left carotid system: The calcified plaque is scattered throughout
the common carotid artery, and there is moderate calcified and soft
plaque at the carotid bifurcation. There is no significant common or
internal carotid artery stenosis. Mild ECA origin stenosis is noted.

Vertebral arteries: Patent with the right being mildly dominant.
Calcified plaque results in mild right and moderate to severe left
vertebral artery origin stenosis.

Skeleton: Moderate lower cervical disc degeneration.

Other neck: No mass or lymph node enlargement.

Upper chest: Mild scarring in the right lung apex.  Emphysema.
IMPRESSION: 1. Heavily calcified mid to distal right common carotid artery
plaque resulting in high-grade stenosis.
2. No significant left-sided common or internal carotid artery
stenosis.
3. Mild right and moderate to severe left vertebral artery origin
stenosis.
4. 65% proximal left subclavian artery stenosis.

## 2017-04-01 MED ORDER — IOPAMIDOL (ISOVUE-370) INJECTION 76%
75.0000 mL | Freq: Once | INTRAVENOUS | Status: AC | PRN
  Administered 2017-04-01: 75 mL via INTRAVENOUS

## 2017-05-02 ENCOUNTER — Other Ambulatory Visit (INDEPENDENT_AMBULATORY_CARE_PROVIDER_SITE_OTHER): Payer: Self-pay

## 2017-05-02 ENCOUNTER — Encounter (INDEPENDENT_AMBULATORY_CARE_PROVIDER_SITE_OTHER): Payer: Self-pay

## 2017-05-05 ENCOUNTER — Encounter
Admission: RE | Admit: 2017-05-05 | Discharge: 2017-05-05 | Disposition: A | Discharge status: Home / Self Care | Payer: 59 | Source: Ambulatory Visit | Attending: Vascular Surgery | Admitting: Vascular Surgery

## 2017-05-05 DIAGNOSIS — Z01812 Encounter for preprocedural laboratory examination: Secondary | ICD-10-CM | POA: Diagnosis not present

## 2017-05-05 HISTORY — DX: Fatty (change of) liver, not elsewhere classified: K76.0

## 2017-05-05 HISTORY — DX: Gastro-esophageal reflux disease without esophagitis: K21.9

## 2017-05-05 HISTORY — DX: Chronic obstructive pulmonary disease, unspecified: J44.9

## 2017-05-05 LAB — COMPREHENSIVE METABOLIC PANEL
ALBUMIN: 4.1 g/dL (ref 3.5–5.0)
ALK PHOS: 47 U/L (ref 38–126)
ALT: 30 U/L (ref 17–63)
AST: 25 U/L (ref 15–41)
Anion gap: 9 (ref 5–15)
BILIRUBIN TOTAL: 1.6 mg/dL — AB (ref 0.3–1.2)
BUN: 23 mg/dL — AB (ref 6–20)
CALCIUM: 9.6 mg/dL (ref 8.9–10.3)
CO2: 29 mmol/L (ref 22–32)
CREATININE: 1.33 mg/dL — AB (ref 0.61–1.24)
Chloride: 99 mmol/L — ABNORMAL LOW (ref 101–111)
GFR calc Af Amer: >60 mL/min (ref >60)
GFR calc non Af Amer: 55 mL/min — ABNORMAL LOW (ref >60)
GLUCOSE: 260 mg/dL — AB (ref 65–99)
Potassium: 5.6 mmol/L — ABNORMAL HIGH (ref 3.5–5.1)
Sodium: 137 mmol/L (ref 135–145)
TOTAL PROTEIN: 7 g/dL (ref 6.5–8.1)

## 2017-05-05 LAB — CBC
HEMATOCRIT: 43.2 % (ref 40.0–52.0)
HEMOGLOBIN: 14.7 g/dL (ref 13.0–18.0)
MCH: 30.3 pg (ref 26.0–34.0)
MCHC: 34.1 g/dL (ref 32.0–36.0)
MCV: 88.9 fL (ref 80.0–100.0)
Platelets: 127 10*3/uL — ABNORMAL LOW (ref 150–440)
RBC: 4.86 MIL/uL (ref 4.40–5.90)
RDW: 14.6 % — ABNORMAL HIGH (ref 11.5–14.5)
WBC: 4.3 10*3/uL (ref 3.8–10.6)

## 2017-05-05 LAB — PROTIME-INR
INR: 0.97
Prothrombin Time: 12.9 seconds (ref 11.4–15.2)

## 2017-05-05 LAB — TYPE AND SCREEN
ABO/RH(D): A POS
Antibody Screen: NEGATIVE

## 2017-05-05 LAB — DIFFERENTIAL
Basophils Absolute: 0 10*3/uL (ref 0–0.1)
Basophils Relative: 1 %
EOS ABS: 0.2 10*3/uL (ref 0–0.7)
EOS PCT: 5 %
LYMPHS ABS: 1 10*3/uL (ref 1.0–3.6)
LYMPHS PCT: 24 %
MONO ABS: 0.3 10*3/uL (ref 0.2–1.0)
MONOS PCT: 8 %
Neutro Abs: 2.7 10*3/uL (ref 1.4–6.5)
Neutrophils Relative %: 62 %

## 2017-05-05 LAB — APTT: aPTT: 28 seconds (ref 24–36)

## 2017-05-05 LAB — SURGICAL PCR SCREEN
MRSA, PCR: NEGATIVE
Staphylococcus aureus: NEGATIVE

## 2017-05-05 NOTE — Pre-Procedure Instructions (Signed)
Spoke to Dr. Rosey Bath about [name]'s potassium result from today = 5.6, in past [name] has had one potassium of 5.5 other results were 4.3-4.6.  Dr. Rosey Bath instructed this nurse to let the surgeon know of the abnormal potassium, that the potassium will be recheck the morning of surgery, if it is high again at that time, [name]'s surgery may be cancelled.  [name]'s lab results from today's PAT visit have been faxed to Dr. Bunnie Domino office along with a note indicating a need for an in date H+P.

## 2017-05-05 NOTE — Pre-Procedure Instructions (Addendum)
John Cowman, MD - 04/29/2017 9:15 AM EDT Formatting of this note may be different from the original. Established Patient Visit   Chief Complaint: Chief Complaint  Patient presents with  . Follow-up  echo/stress  . Pre Op Consulting  Date of Service: 04/29/2017 Date of Birth: 1951-01-20 PCP: John Guise, MD  History of Present Illness: [name] is a 66 y.o.male patient who returns for  1. Paroxysmal SVT 2. Hential hypertension 3. Hyperlipidemia 4. Type 2 diabetes 5. COPD 6. Nonischemic dilated cardiomyopathy 7. Chronic systolic congestive heart failure, NYHA class II  Patient returns for preoperative cardiovascular assessment prior to right carotid endarterectomy. The patient underwent recent amputation left second toe 02/25/2017. The patient is now scheduled for right carotid endarterectomy with [name]. The patient denies chest pain. He has chronic exertional dyspnea due to underlying COPD. He denies palpitations or heart racing. He denies peripheral edema. 2D echocardiogram was performed 04/25/2017 which revealed LVEF of 50%. [place] study 04/25/2017 revealed LVEF of 59%, with mild inferior scar without evidence for ischemia.  The patient has essential hypertension, blood pressure low normal on lisinopril and metoprolol succinate, which are well tolerated without apparent side effects. Patient denies lightheadedness, dizziness, or fatigue. The patient follows a low-sodium, no added salt diet.  The patient has hyperlipidemia, currently on lovastatin, which is tolerated well without apparent side effects, followed by his primary care provider. The patient follows a low-cholesterol diet.  The patient has type 2 diabetes, currently on glimepiride which is well tolerated without apparent side effects, followed by his primary care provider. The patient follows a low carbohydrate, ADA diet.  Past Medical and Surgical History  Past Medical History Past Medical History:   Diagnosis Date  . Diabetes mellitus type 2, uncomplicated (CMS-HCC)  . History of acute cholecystitis  . History of cholelithiasis  . History of chronic cholecystitis  . History of fracture of clavicle  . History of hemorrhoids  . History of pleurisy  . Hyperlipidemia, unspecified  . Hypertension  . Paroxysmal SVT (supraventricular tachycardia) (CMS-HCC)  . Seasonal allergies   Past Surgical History He has a past surgical history that includes foot surgery (Right); Laparoscopic cholecystectomy (01/03/2007); ercp (12/29/2006); and Back surgery.   Medications and Allergies  Current Medications  Current Outpatient Prescriptions  Medication Sig Dispense Refill  . aspirin 81 MG EC tablet Take 81 mg by mouth once daily.  . calcium carbonate-vitamin D3 (OS-CAL 500+D) 500 mg(1,250mg ) -200 unit tablet Take 1 tablet by mouth once daily.  . cyanocobalamin (VITAMIN B12) 1000 MCG tablet Take 1,000 mcg by mouth once daily.  . diphenhydrAMINE (BENADRYL) 25 mg capsule Take 25 mg by mouth every 6 (six) hours as needed for Itching.  . gabapentin (NEURONTIN) 100 MG capsule 100 mg 2 (two) times daily.   Marland Kitchen glyBURIDE (DIABETA) 2.5 MG tablet Take 2.5 mg by mouth 2 (two) times daily with meals.   Marland Kitchen lisinopril (PRINIVIL,ZESTRIL) 5 MG tablet Take 5 mg by mouth once daily.   Marland Kitchen lovastatin (MEVACOR) 10 MG tablet Take 10 mg by mouth daily with dinner.  . metoprolol succinate (TOPROL-XL) 100 MG XL tablet TAKE ONE TABLET BY MOUTH ONCE DAILY 30 tablet 6  . mometasone-formoterol (DULERA) 100-5 mcg/actuation inhaler Inhale 2 inhalations into the lungs 2 (two) times daily.  . peg-electrolyte (NULYTELY) solution Take as directed for colonic prep. 4000 mL 0  . traMADol (ULTRAM) 50 mg tablet Take 50 mg by mouth 2 (two) times daily. Reported on 11/05/2015  No current facility-administered medications for this visit.   Allergies: Ampicillin; Bactrim ds [sulfamethoxazole-trimethoprim]; Penicillin v potassium;  Amoxicillin; and Penicillins  Social and Family History  Social History reports that he quit smoking about 20 years ago. He has a 60.00 pack-year smoking history. He has never used smokeless tobacco. He reports that he does not drink alcohol or use drugs.  Family History Family History  Problem Relation Age of Onset  . No Known Problems Mother  . No Known Problems Father  . Diabetes Brother  . Coronary Artery Disease (Blocked arteries around heart) Brother  . Diabetes Maternal Grandmother  . Diabetes Paternal Grandmother  . Colon cancer Other  . Hyperlipidemia (Elevated cholesterol) Other  . High blood pressure (Hypertension) Other  . Mental illness Other   Review of Systems   Review of Systems: The patient denies chest pain, reports chronic exertional shortness of breath, without orthopnea, paroxysmal nocturnal dyspnea, pedal edema, palpitations, heart racing, presyncope, syncope. Review of 12 Systems is negative except as described above.  Physical Examination   Vitals: BP 102/60  Pulse 80  Ht 177.8 cm (5\' 10" )  Wt 83 kg (183 lb)  BMI 26.26 kg/m  Ht:177.8 cm (5\' 10" ) Wt:83 kg (183 lb) AUQ:JFHL surface area is 2.02 meters squared. Body mass index is 26.26 kg/m.  General: Alert and oriented. Well-appearing. No acute distress. HEENT: Pupils equally reactive to light and accomodation  Neck: Supple, carotid pulses 2+ Lungs: Normal effort of breathing; clear to auscultation bilaterally; no wheezes, rales, rhonchi Heart: Regular rate and rhythm. No murmur, rub, or gallop Abdomen: soft nontender, nondistended, with normal bowel sounds Extremities: no cyanosis, clubbing, or edema Peripheral Pulses: 2+ radial bilaterally Skin: Warm, dry, no diaphoresis  Assessment   67 y.o. male with  1. Paroxysmal SVT (supraventricular tachycardia) (CMS-HCC)  2. Essential hypertension  3. Cardiomyopathy, nonischemic (CMS-HCC)  4. Preop cardiovascular exam  5. Hyperlipidemia, unspecified  hyperlipidemia type  6. Chest pain with low risk for cardiac etiology, unspecified   66 year old gentleman with paroxysmal SVT, last episode on 07/10/15 without recurrence. Patient has essential hypertension, blood pressure low normal today on current BP medications. Patient has hyperlipidemia, currently on lovastatin, which is tolerated well. The patient has chronic exertional dyspnea, felt to be due to underlying COPD with nonischemic dilated cardiomyopathy, and chronic systolic congestive heart failure. The patient underwent recent amputation of left second toe and now is awaiting right carotid endarterectomy. 2D echocardiogram revealed preserved left ventricular function. [place] study revealed mild inferior scar without significant ischemia. The patient should be at low and acceptable risk for right carotid endarterectomy.  Plan   1. Continue current medications 2. Patient about low-sodium diet 3. DASH diet printed instructions given to the patient 4. Counseled patient about low-cholesterol diet 5. Continue lovastatin for hyperlipidemia management 6. Low-fat and cholesterol diet printed instructions given to the patient 7. Proceed with right carotid endarterectomy 8. Continue metoprolol succinate pre-, peri-and postoperatively 9. Return to clinic for follow-up in 6 months  No orders of the defined types were placed in this encounter.  Return in about 6 months (around 10/30/2017).  John Cowman, MD PhD [facility]     John Cowman, MD PhD [facility]        Progress Notes - in this encounter  John Cowman, MD - 04/29/2017 9:15 AM EDT Formatting of this note may be different from the original. Established Patient Visit   Chief Complaint: Chief Complaint  Patient presents with  . Follow-up  echo/stress  .  Pre Op Consulting  Date of Service: 04/29/2017 Date of Birth: 05-09-1951 PCP: John Guise, MD  History of Present Illness: Mr. Grafton is a 66  y.o.male patient who returns for  1. Paroxysmal SVT 2. Hential hypertension 3. Hyperlipidemia 4. Type 2 diabetes 5. COPD 6. Nonischemic dilated cardiomyopathy 7. Chronic systolic congestive heart failure, NYHA class II  Patient returns for preoperative cardiovascular assessment prior to right carotid endarterectomy. The patient underwent recent amputation left second toe 02/25/2017. The patient is now scheduled for right carotid endarterectomy with [name]. The patient denies chest pain. He has chronic exertional dyspnea due to underlying COPD. He denies palpitations or heart racing. He denies peripheral edema. 2D echocardiogram was performed 04/25/2017 which revealed LVEF of 50%. [place] study 04/25/2017 revealed LVEF of 59%, with mild inferior scar without evidence for ischemia.  The patient has essential hypertension, blood pressure low normal on lisinopril and metoprolol succinate, which are well tolerated without apparent side effects. Patient denies lightheadedness, dizziness, or fatigue. The patient follows a low-sodium, no added salt diet.  The patient has hyperlipidemia, currently on lovastatin, which is tolerated well without apparent side effects, followed by his primary care provider. The patient follows a low-cholesterol diet.  The patient has type 2 diabetes, currently on glimepiride which is well tolerated without apparent side effects, followed by his primary care provider. The patient follows a low carbohydrate, ADA diet.  Past Medical and Surgical History  Past Medical History Past Medical History:  Diagnosis Date  . Diabetes mellitus type 2, uncomplicated (CMS-HCC)  . History of acute cholecystitis  . History of cholelithiasis  . History of chronic cholecystitis  . History of fracture of clavicle  . History of hemorrhoids  . History of pleurisy  . Hyperlipidemia, unspecified  . Hypertension  . Paroxysmal SVT (supraventricular tachycardia) (CMS-HCC)  . Seasonal  allergies   Past Surgical History He has a past surgical history that includes foot surgery (Right); Laparoscopic cholecystectomy (01/03/2007); ercp (12/29/2006); and Back surgery.   Medications and Allergies  Current Medications  Current Outpatient Prescriptions  Medication Sig Dispense Refill  . aspirin 81 MG EC tablet Take 81 mg by mouth once daily.  . calcium carbonate-vitamin D3 (OS-CAL 500+D) 500 mg(1,250mg ) -200 unit tablet Take 1 tablet by mouth once daily.  . cyanocobalamin (VITAMIN B12) 1000 MCG tablet Take 1,000 mcg by mouth once daily.  . diphenhydrAMINE (BENADRYL) 25 mg capsule Take 25 mg by mouth every 6 (six) hours as needed for Itching.  . gabapentin (NEURONTIN) 100 MG capsule 100 mg 2 (two) times daily.   Marland Kitchen glyBURIDE (DIABETA) 2.5 MG tablet Take 2.5 mg by mouth 2 (two) times daily with meals.   Marland Kitchen lisinopril (PRINIVIL,ZESTRIL) 5 MG tablet Take 5 mg by mouth once daily.   Marland Kitchen lovastatin (MEVACOR) 10 MG tablet Take 10 mg by mouth daily with dinner.  . metoprolol succinate (TOPROL-XL) 100 MG XL tablet TAKE ONE TABLET BY MOUTH ONCE DAILY 30 tablet 6  . mometasone-formoterol (DULERA) 100-5 mcg/actuation inhaler Inhale 2 inhalations into the lungs 2 (two) times daily.  . peg-electrolyte (NULYTELY) solution Take as directed for colonic prep. 4000 mL 0  . traMADol (ULTRAM) 50 mg tablet Take 50 mg by mouth 2 (two) times daily. Reported on 11/05/2015   No current facility-administered medications for this visit.   Allergies: Ampicillin; Bactrim ds [sulfamethoxazole-trimethoprim]; Penicillin v potassium; Amoxicillin; and Penicillins  Social and Family History  Social History reports that he quit smoking about 20 years ago. He  has a 60.00 pack-year smoking history. He has never used smokeless tobacco. He reports that he does not drink alcohol or use drugs.  Family History Family History  Problem Relation Age of Onset  . No Known Problems Mother  . No Known Problems Father  .  Diabetes Brother  . Coronary Artery Disease (Blocked arteries around heart) Brother  . Diabetes Maternal Grandmother  . Diabetes Paternal Grandmother  . Colon cancer Other  . Hyperlipidemia (Elevated cholesterol) Other  . High blood pressure (Hypertension) Other  . Mental illness Other   Review of Systems   Review of Systems: The patient denies chest pain, reports chronic exertional shortness of breath, without orthopnea, paroxysmal nocturnal dyspnea, pedal edema, palpitations, heart racing, presyncope, syncope. Review of 12 Systems is negative except as described above.  Physical Examination   Vitals: BP 102/60  Pulse 80  Ht 177.8 cm (5\' 10" )  Wt 83 kg (183 lb)  BMI 26.26 kg/m  Ht:177.8 cm (5\' 10" ) Wt:83 kg (183 lb) SFK:CLEX surface area is 2.02 meters squared. Body mass index is 26.26 kg/m.  General: Alert and oriented. Well-appearing. No acute distress. HEENT: Pupils equally reactive to light and accomodation  Neck: Supple, carotid pulses 2+ Lungs: Normal effort of breathing; clear to auscultation bilaterally; no wheezes, rales, rhonchi Heart: Regular rate and rhythm. No murmur, rub, or gallop Abdomen: soft nontender, nondistended, with normal bowel sounds Extremities: no cyanosis, clubbing, or edema Peripheral Pulses: 2+ radial bilaterally Skin: Warm, dry, no diaphoresis  Assessment   66 y.o. male with  1. Paroxysmal SVT (supraventricular tachycardia) (CMS-HCC)  2. Essential hypertension  3. Cardiomyopathy, nonischemic (CMS-HCC)  4. Preop cardiovascular exam  5. Hyperlipidemia, unspecified hyperlipidemia type  6. Chest pain with low risk for cardiac etiology, unspecified   66 year old gentleman with paroxysmal SVT, last episode on 07/10/15 without recurrence. Patient has essential hypertension, blood pressure low normal today on current BP medications. Patient has hyperlipidemia, currently on lovastatin, which is tolerated well. The patient has chronic exertional  dyspnea, felt to be due to underlying COPD with nonischemic dilated cardiomyopathy, and chronic systolic congestive heart failure. The patient underwent recent amputation of left second toe and now is awaiting right carotid endarterectomy. 2D echocardiogram revealed preserved left ventricular function. [place] study revealed mild inferior scar without significant ischemia. The patient should be at low and acceptable risk for right carotid endarterectomy.  Plan   1. Continue current medications 2. Patient about low-sodium diet 3. DASH diet printed instructions given to the patient 4. Counseled patient about low-cholesterol diet 5. Continue lovastatin for hyperlipidemia management 6. Low-fat and cholesterol diet printed instructions given to the patient 7. Proceed with right carotid endarterectomy 8. Continue metoprolol succinate pre-, peri-and postoperatively 9. Return to clinic for follow-up in 6 months  No orders of the defined types were placed in this encounter.  Return in about 6 months (around 10/30/2017).  John Cowman, MD PhD [facility]

## 2017-05-05 NOTE — Patient Instructions (Signed)
  Your procedure is scheduled [id number] May 12, 2017. Report to Same Day Surgery. To find out your arrival time please call [phone] between 1PM - 3PM on Wednesday May 11, 2017.  Remember: Instructions that are not followed completely may result in serious medical risk, up to and including death, or upon the discretion of your surgeon and anesthesiologist your surgery may need to be rescheduled.    _x___ 1. Do not eat food or drink liquids after midnight. No gum chewing or hard candies.     ____ 2. No Alcohol for 24 hours before or after surgery.   ____ 3. Bring all medications with you on the day of surgery if instructed.    __x__ 4. Notify your doctor if there is any change in your medical condition     (cold, fever, infections).    _____ 5. No smoking 24 hours prior to surgery.     Do not wear jewelry, make-up, hairpins, clips or nail polish.  Do not wear lotions, powders, or perfumes.   Do not shave 48 hours prior to surgery. Men may shave face and neck.  Do not bring valuables to the hospital.    [facility] is not responsible for any belongings or valuables.               Contacts, dentures or bridgework may not be worn into surgery.  Leave your suitcase in the car. After surgery it may be brought to your room.  For patients admitted to the hospital, discharge time is determined by your treatment team.   Patients discharged the day of surgery will not be allowed to drive home.    Please read over the following fact sheets that you were given:   [facility] Preparing for Surgery  _x___ Take these medicines the morning of surgery with A SIP OF WATER:    1. famotidine (PEPCID)  2. gabapentin (NEURONTIN)  3. metoprolol succinate (TOPROL-XL)    ____ Fleet Enema (as directed)   _x___ Use CHG Soap as directed on instruction sheet  _x___ Use inhalers on the day of surgery and bring to hospital day of surgery  ____ Stop metformin 2 days prior to  surgery    ____ Take 1/2 of usual insulin dose the night before surgery and none on the morning of surgery.   ____ Stop Coumadin/Plavix/aspirin on does not apply.  _x___ Stop Anti-inflammatories such as Advil, Aleve, Ibuprofen, Motrin, Naproxen, Naprosyn, Goodies powders or  aspirin products. OK to take Tylenol or traMADol (ULTRAM) .   _x___ Stop supplements:Omega-3 Fatty Acids (FISH OIL PO)  until after surgery.    ____ Bring C-Pap to the hospital.

## 2017-05-11 MED ORDER — CLINDAMYCIN PHOSPHATE 300 MG/50ML IV SOLN
300.0000 mg | Freq: Once | INTRAVENOUS | Status: AC
  Administered 2017-05-12: 300 mg via INTRAVENOUS

## 2017-05-12 ENCOUNTER — Inpatient Hospital Stay: Payer: 59 | Admitting: Registered Nurse

## 2017-05-12 ENCOUNTER — Encounter: Payer: Self-pay | Admitting: *Deleted

## 2017-05-12 ENCOUNTER — Encounter
Admission: RE | Disposition: A | Discharge status: Home / Self Care | Payer: Self-pay | Source: Ambulatory Visit | Attending: Vascular Surgery

## 2017-05-12 ENCOUNTER — Inpatient Hospital Stay
Admission: RE | Admit: 2017-05-12 | Discharge: 2017-05-13 | DRG: 039 | Disposition: A | Discharge status: Home / Self Care | Payer: 59 | Source: Ambulatory Visit | Attending: Vascular Surgery | Admitting: Vascular Surgery

## 2017-05-12 DIAGNOSIS — I10 Essential (primary) hypertension: Secondary | ICD-10-CM | POA: Diagnosis present

## 2017-05-12 DIAGNOSIS — K219 Gastro-esophageal reflux disease without esophagitis: Secondary | ICD-10-CM | POA: Diagnosis present

## 2017-05-12 DIAGNOSIS — Z87891 Personal history of nicotine dependence: Secondary | ICD-10-CM | POA: Diagnosis not present

## 2017-05-12 DIAGNOSIS — Z89422 Acquired absence of other left toe(s): Secondary | ICD-10-CM | POA: Diagnosis not present

## 2017-05-12 DIAGNOSIS — I6521 Occlusion and stenosis of right carotid artery: Secondary | ICD-10-CM | POA: Diagnosis present

## 2017-05-12 DIAGNOSIS — J449 Chronic obstructive pulmonary disease, unspecified: Secondary | ICD-10-CM | POA: Diagnosis present

## 2017-05-12 DIAGNOSIS — K760 Fatty (change of) liver, not elsewhere classified: Secondary | ICD-10-CM | POA: Diagnosis present

## 2017-05-12 DIAGNOSIS — E119 Type 2 diabetes mellitus without complications: Secondary | ICD-10-CM | POA: Diagnosis present

## 2017-05-12 HISTORY — PX: ENDARTERECTOMY: SHX5162

## 2017-05-12 LAB — POCT I-STAT 4, (NA,K, GLUC, HGB,HCT)
Glucose, Bld: 173 mg/dL — ABNORMAL HIGH (ref 65–99)
HEMATOCRIT: 39 % (ref 39.0–52.0)
HEMOGLOBIN: 13.3 g/dL (ref 13.0–17.0)
POTASSIUM: 4.5 mmol/L (ref 3.5–5.1)
SODIUM: 140 mmol/L (ref 135–145)

## 2017-05-12 LAB — GLUCOSE, CAPILLARY
GLUCOSE-CAPILLARY: 198 mg/dL — AB (ref 65–99)
GLUCOSE-CAPILLARY: 216 mg/dL — AB (ref 65–99)
Glucose-Capillary: 168 mg/dL — ABNORMAL HIGH (ref 65–99)

## 2017-05-12 LAB — ABO/RH: ABO/RH(D): A POS

## 2017-05-12 SURGERY — ENDARTERECTOMY, CAROTID
Anesthesia: General | Laterality: Right | Wound class: Clean

## 2017-05-12 MED ORDER — IPRATROPIUM-ALBUTEROL 0.5-2.5 (3) MG/3ML IN SOLN
3.0000 mL | RESPIRATORY_TRACT | Status: DC
  Administered 2017-05-12: 3 mL via RESPIRATORY_TRACT

## 2017-05-12 MED ORDER — HEPARIN SODIUM (PORCINE) 1000 UNIT/ML IJ SOLN
INTRAMUSCULAR | Status: DC | PRN
  Administered 2017-05-12: 6000 [IU] via INTRAVENOUS

## 2017-05-12 MED ORDER — TRAMADOL HCL 50 MG PO TABS: 50.0000 mg | ORAL_TABLET | Freq: Two times a day (BID) | ORAL | Status: DC | PRN

## 2017-05-12 MED ORDER — PHENYLEPHRINE HCL 10 MG/ML IJ SOLN
INTRAMUSCULAR | Status: DC | PRN
  Administered 2017-05-12: 100 ug via INTRAVENOUS

## 2017-05-12 MED ORDER — POTASSIUM CHLORIDE CRYS ER 20 MEQ PO TBCR: 20.0000 meq | EXTENDED_RELEASE_TABLET | Freq: Every day | ORAL | Status: DC | PRN

## 2017-05-12 MED ORDER — SODIUM CHLORIDE 0.9 % IV SOLN
INTRAVENOUS | Status: DC | PRN
  Administered 2017-05-12: 30 ug/min via INTRAVENOUS

## 2017-05-12 MED ORDER — ONDANSETRON HCL 4 MG/2ML IJ SOLN
INTRAMUSCULAR | Status: AC
  Filled 2017-05-12: qty 2

## 2017-05-12 MED ORDER — CLOPIDOGREL BISULFATE 75 MG PO TABS
75.0000 mg | ORAL_TABLET | Freq: Every day | ORAL | Status: DC
  Administered 2017-05-13: 75 mg via ORAL
  Filled 2017-05-12: qty 1

## 2017-05-12 MED ORDER — GUAIFENESIN-DM 100-10 MG/5ML PO SYRP
15.0000 mL | ORAL_SOLUTION | ORAL | Status: DC | PRN
  Filled 2017-05-12: qty 15

## 2017-05-12 MED ORDER — METOPROLOL SUCCINATE ER 50 MG PO TB24
100.0000 mg | ORAL_TABLET | Freq: Every day | ORAL | Status: DC
  Administered 2017-05-13: 100 mg via ORAL
  Filled 2017-05-12: qty 2

## 2017-05-12 MED ORDER — EVICEL 2 ML EX KIT
PACK | CUTANEOUS | Status: DC | PRN
  Administered 2017-05-12: 2 mL

## 2017-05-12 MED ORDER — ROCURONIUM BROMIDE 50 MG/5ML IV SOLN
INTRAVENOUS | Status: AC
  Filled 2017-05-12: qty 1

## 2017-05-12 MED ORDER — SODIUM CHLORIDE 0.9 % IV SOLN
INTRAVENOUS | Status: DC
  Administered 2017-05-12: 17:00:00 via INTRAVENOUS
  Administered 2017-05-13: 75 mL/h via INTRAVENOUS

## 2017-05-12 MED ORDER — SODIUM CHLORIDE 0.9 % IV SOLN: 500.0000 mL | Freq: Once | INTRAVENOUS | Status: DC | PRN

## 2017-05-12 MED ORDER — ESMOLOL HCL-SODIUM CHLORIDE 2000 MG/100ML IV SOLN
25.0000 ug/kg/min | INTRAVENOUS | Status: DC
  Filled 2017-05-12: qty 100

## 2017-05-12 MED ORDER — LIDOCAINE HCL 1 % IJ SOLN
INTRAMUSCULAR | Status: DC | PRN
  Administered 2017-05-12: 10 mL

## 2017-05-12 MED ORDER — GLYBURIDE 2.5 MG PO TABS
2.5000 mg | ORAL_TABLET | Freq: Two times a day (BID) | ORAL | Status: DC
  Administered 2017-05-12 – 2017-05-13 (×2): 2.5 mg via ORAL
  Filled 2017-05-12 (×3): qty 1

## 2017-05-12 MED ORDER — EPHEDRINE SULFATE 50 MG/ML IJ SOLN
INTRAMUSCULAR | Status: AC
  Filled 2017-05-12: qty 1

## 2017-05-12 MED ORDER — LIDOCAINE HCL (CARDIAC) 20 MG/ML IV SOLN
INTRAVENOUS | Status: DC | PRN
  Administered 2017-05-12: 100 mg via INTRAVENOUS

## 2017-05-12 MED ORDER — GABAPENTIN 100 MG PO CAPS
200.0000 mg | ORAL_CAPSULE | Freq: Three times a day (TID) | ORAL | Status: DC
  Administered 2017-05-12 – 2017-05-13 (×3): 200 mg via ORAL
  Filled 2017-05-12 (×3): qty 2

## 2017-05-12 MED ORDER — LISINOPRIL 5 MG PO TABS
5.0000 mg | ORAL_TABLET | Freq: Every day | ORAL | Status: DC
  Administered 2017-05-12 – 2017-05-13 (×2): 5 mg via ORAL
  Filled 2017-05-12 (×2): qty 1

## 2017-05-12 MED ORDER — MOMETASONE FURO-FORMOTEROL FUM 200-5 MCG/ACT IN AERO
2.0000 | INHALATION_SPRAY | Freq: Two times a day (BID) | RESPIRATORY_TRACT | Status: DC
  Administered 2017-05-12 – 2017-05-13 (×2): 2 via RESPIRATORY_TRACT
  Filled 2017-05-12: qty 8.8

## 2017-05-12 MED ORDER — EVICEL 2 ML EX KIT
PACK | CUTANEOUS | Status: AC
  Filled 2017-05-12: qty 1

## 2017-05-12 MED ORDER — EPHEDRINE SULFATE 50 MG/ML IJ SOLN
INTRAMUSCULAR | Status: DC | PRN
  Administered 2017-05-12 (×3): 10 mg via INTRAVENOUS

## 2017-05-12 MED ORDER — FAMOTIDINE IN NACL 20-0.9 MG/50ML-% IV SOLN
20.0000 mg | Freq: Two times a day (BID) | INTRAVENOUS | Status: DC
  Administered 2017-05-12 – 2017-05-13 (×2): 20 mg via INTRAVENOUS
  Filled 2017-05-12 (×2): qty 50

## 2017-05-12 MED ORDER — LABETALOL HCL 5 MG/ML IV SOLN: 10.0000 mg | INTRAVENOUS | Status: DC | PRN

## 2017-05-12 MED ORDER — IPRATROPIUM BROMIDE 0.02 % IN SOLN: 2.5000 mL | RESPIRATORY_TRACT | Status: DC | PRN

## 2017-05-12 MED ORDER — ONDANSETRON HCL 4 MG/2ML IJ SOLN
4.0000 mg | Freq: Four times a day (QID) | INTRAMUSCULAR | Status: DC | PRN
  Administered 2017-05-13: 4 mg via INTRAVENOUS
  Filled 2017-05-12: qty 2

## 2017-05-12 MED ORDER — FAMOTIDINE 20 MG PO TABS
20.0000 mg | ORAL_TABLET | Freq: Every day | ORAL | Status: DC | PRN
  Administered 2017-05-13: 20 mg via ORAL
  Filled 2017-05-12: qty 1

## 2017-05-12 MED ORDER — SODIUM CHLORIDE 0.9 % IV SOLN
INTRAVENOUS | Status: DC
  Administered 2017-05-12 (×2): via INTRAVENOUS

## 2017-05-12 MED ORDER — ROCURONIUM BROMIDE 100 MG/10ML IV SOLN
INTRAVENOUS | Status: DC | PRN
  Administered 2017-05-12: 10 mg via INTRAVENOUS
  Administered 2017-05-12: 50 mg via INTRAVENOUS
  Administered 2017-05-12: 10 mg via INTRAVENOUS

## 2017-05-12 MED ORDER — PHENOL 1.4 % MT LIQD
1.0000 | OROMUCOSAL | Status: DC | PRN
  Filled 2017-05-12: qty 177

## 2017-05-12 MED ORDER — HEPARIN SODIUM (PORCINE) 1000 UNIT/ML IJ SOLN
INTRAMUSCULAR | Status: AC
  Filled 2017-05-12: qty 1

## 2017-05-12 MED ORDER — PROPOFOL 10 MG/ML IV BOLUS
INTRAVENOUS | Status: AC
  Filled 2017-05-12: qty 40

## 2017-05-12 MED ORDER — SODIUM CHLORIDE 0.9 % IV SOLN
INTRAVENOUS | Status: DC
  Administered 2017-05-12: 11:00:00 via INTRAVENOUS

## 2017-05-12 MED ORDER — NITROGLYCERIN IN D5W 200-5 MCG/ML-% IV SOLN: 5.0000 ug/min | INTRAVENOUS | Status: DC

## 2017-05-12 MED ORDER — PROPOFOL 10 MG/ML IV BOLUS
INTRAVENOUS | Status: DC | PRN
  Administered 2017-05-12: 150 mg via INTRAVENOUS

## 2017-05-12 MED ORDER — DOCUSATE SODIUM 100 MG PO CAPS
100.0000 mg | ORAL_CAPSULE | Freq: Every day | ORAL | Status: DC
  Administered 2017-05-13: 100 mg via ORAL
  Filled 2017-05-12: qty 1

## 2017-05-12 MED ORDER — PRAVASTATIN SODIUM 20 MG PO TABS
10.0000 mg | ORAL_TABLET | Freq: Every day | ORAL | Status: DC
  Administered 2017-05-12: 10 mg via ORAL
  Filled 2017-05-12: qty 1

## 2017-05-12 MED ORDER — CALCIUM CARBONATE-VITAMIN D 500-200 MG-UNIT PO TABS
1.0000 | ORAL_TABLET | Freq: Every day | ORAL | Status: DC
  Administered 2017-05-12 – 2017-05-13 (×2): 1 via ORAL
  Filled 2017-05-12 (×2): qty 1

## 2017-05-12 MED ORDER — HEPARIN SODIUM (PORCINE) 10000 UNIT/ML IJ SOLN
INTRAMUSCULAR | Status: AC
  Filled 2017-05-12: qty 1

## 2017-05-12 MED ORDER — ACETAMINOPHEN 325 MG PO TABS: 325.0000 mg | ORAL_TABLET | ORAL | Status: DC | PRN

## 2017-05-12 MED ORDER — FENTANYL CITRATE (PF) 100 MCG/2ML IJ SOLN
INTRAMUSCULAR | Status: AC
  Filled 2017-05-12: qty 2

## 2017-05-12 MED ORDER — HYDRALAZINE HCL 20 MG/ML IJ SOLN: 5.0000 mg | INTRAMUSCULAR | Status: DC | PRN

## 2017-05-12 MED ORDER — HYDROCODONE-ACETAMINOPHEN 5-325 MG PO TABS
1.0000 | ORAL_TABLET | Freq: Four times a day (QID) | ORAL | Status: DC | PRN
  Administered 2017-05-12: 1 via ORAL
  Filled 2017-05-12: qty 1

## 2017-05-12 MED ORDER — CLINDAMYCIN PHOSPHATE 300 MG/50ML IV SOLN
300.0000 mg | Freq: Four times a day (QID) | INTRAVENOUS | Status: AC
  Administered 2017-05-12 (×2): 300 mg via INTRAVENOUS
  Filled 2017-05-12 (×2): qty 50

## 2017-05-12 MED ORDER — MIDAZOLAM HCL 2 MG/2ML IJ SOLN
INTRAMUSCULAR | Status: DC | PRN
  Administered 2017-05-12: 2 mg via INTRAVENOUS

## 2017-05-12 MED ORDER — LIDOCAINE HCL (PF) 2 % IJ SOLN
INTRAMUSCULAR | Status: AC
  Filled 2017-05-12: qty 2

## 2017-05-12 MED ORDER — MIDAZOLAM HCL 2 MG/2ML IJ SOLN
INTRAMUSCULAR | Status: AC
  Filled 2017-05-12: qty 2

## 2017-05-12 MED ORDER — NITROGLYCERIN 0.2 MG/ML ON CALL CATH LAB
INTRAVENOUS | Status: DC | PRN
  Administered 2017-05-12: 40 ug via INTRAVENOUS

## 2017-05-12 MED ORDER — DIPHENHYDRAMINE HCL 25 MG PO CAPS
25.0000 mg | ORAL_CAPSULE | Freq: Four times a day (QID) | ORAL | Status: DC | PRN
  Administered 2017-05-13: 25 mg via ORAL
  Filled 2017-05-12 (×3): qty 1

## 2017-05-12 MED ORDER — MAGNESIUM SULFATE 2 GM/50ML IV SOLN: 2.0000 g | Freq: Every day | INTRAVENOUS | Status: DC | PRN

## 2017-05-12 MED ORDER — ASPIRIN EC 81 MG PO TBEC
81.0000 mg | DELAYED_RELEASE_TABLET | Freq: Every day | ORAL | Status: DC
Start: 2017-05-12 — End: 2017-05-13
  Administered 2017-05-12 – 2017-05-13 (×2): 81 mg via ORAL
  Filled 2017-05-12 (×2): qty 1

## 2017-05-12 MED ORDER — MORPHINE SULFATE (PF) 2 MG/ML IV SOLN
2.0000 mg | INTRAVENOUS | Status: DC | PRN
  Administered 2017-05-12: 2 mg via INTRAVENOUS
  Administered 2017-05-13: 4 mg via INTRAVENOUS
  Filled 2017-05-12: qty 2
  Filled 2017-05-12: qty 1

## 2017-05-12 MED ORDER — FENTANYL CITRATE (PF) 100 MCG/2ML IJ SOLN
INTRAMUSCULAR | Status: DC | PRN
  Administered 2017-05-12 (×2): 50 ug via INTRAVENOUS
  Administered 2017-05-12: 100 ug via INTRAVENOUS

## 2017-05-12 MED ORDER — FENTANYL CITRATE (PF) 100 MCG/2ML IJ SOLN: 25.0000 ug | INTRAMUSCULAR | Status: DC | PRN

## 2017-05-12 MED ORDER — DEXAMETHASONE SODIUM PHOSPHATE 10 MG/ML IJ SOLN
INTRAMUSCULAR | Status: DC | PRN
  Administered 2017-05-12: 10 mg via INTRAVENOUS

## 2017-05-12 MED ORDER — ONDANSETRON HCL 4 MG/2ML IJ SOLN: 4.0000 mg | Freq: Once | INTRAMUSCULAR | Status: DC | PRN

## 2017-05-12 MED ORDER — ONDANSETRON HCL 4 MG/2ML IJ SOLN
INTRAMUSCULAR | Status: DC | PRN
  Administered 2017-05-12: 4 mg via INTRAVENOUS

## 2017-05-12 MED ORDER — METOPROLOL TARTRATE 5 MG/5ML IV SOLN: 2.0000 mg | INTRAVENOUS | Status: DC | PRN

## 2017-05-12 MED ORDER — DEXAMETHASONE SODIUM PHOSPHATE 10 MG/ML IJ SOLN
INTRAMUSCULAR | Status: AC
  Filled 2017-05-12: qty 1

## 2017-05-12 MED ORDER — IPRATROPIUM-ALBUTEROL 0.5-2.5 (3) MG/3ML IN SOLN
RESPIRATORY_TRACT | Status: AC
  Administered 2017-05-12: 3 mL via RESPIRATORY_TRACT
  Filled 2017-05-12: qty 3

## 2017-05-12 MED ORDER — HEPARIN SODIUM (PORCINE) 1000 UNIT/ML IJ SOLN
INTRAMUSCULAR | Status: DC | PRN
  Administered 2017-05-12: 20 mL via INTRAMUSCULAR

## 2017-05-12 MED ORDER — DEXAMETHASONE SODIUM PHOSPHATE 10 MG/ML IJ SOLN
INTRAMUSCULAR | Status: AC
Start: 2017-05-12 — End: ?
  Filled 2017-05-12: qty 1

## 2017-05-12 MED ORDER — ACETAMINOPHEN 650 MG RE SUPP: 325.0000 mg | RECTAL | Status: DC | PRN

## 2017-05-12 MED ORDER — ALUM & MAG HYDROXIDE-SIMETH 200-200-20 MG/5ML PO SUSP
15.0000 mL | ORAL | Status: DC | PRN
  Administered 2017-05-13: 30 mL via ORAL
  Filled 2017-05-12 (×2): qty 30

## 2017-05-12 SURGICAL SUPPLY — 61 items
BAG DECANTER FOR FLEXI CONT (MISCELLANEOUS) ×3 IMPLANT
BLADE SURG 15 STRL LF DISP TIS (BLADE) ×1 IMPLANT
BLADE SURG 15 STRL SS (BLADE) ×2
BLADE SURG SZ11 CARB STEEL (BLADE) ×3 IMPLANT
BOOT SUTURE AID YELLOW STND (SUTURE) ×3 IMPLANT
BRUSH SCRUB 4% CHG (MISCELLANEOUS) ×3 IMPLANT
CANISTER SUCT 1200ML W/VALVE (MISCELLANEOUS) ×3 IMPLANT
CATH TRAY 16F METER LATEX (MISCELLANEOUS) ×3 IMPLANT
DERMABOND ADVANCED (GAUZE/BANDAGES/DRESSINGS) ×2
DERMABOND ADVANCED .7 DNX12 (GAUZE/BANDAGES/DRESSINGS) ×1 IMPLANT
DRAPE INCISE IOBAN 66X45 STRL (DRAPES) ×3 IMPLANT
DRAPE LAPAROTOMY 77X122 PED (DRAPES) ×3 IMPLANT
DRAPE SHEET LG 3/4 BI-LAMINATE (DRAPES) ×3 IMPLANT
DRSG TEGADERM 4X4.75 (GAUZE/BANDAGES/DRESSINGS) IMPLANT
DRSG TELFA 3X8 NADH (GAUZE/BANDAGES/DRESSINGS) IMPLANT
DURAPREP 26ML APPLICATOR (WOUND CARE) ×3 IMPLANT
ELECT CAUTERY BLADE 6.4 (BLADE) ×3 IMPLANT
ELECT REM PT RETURN 9FT ADLT (ELECTROSURGICAL) ×3
ELECTRODE REM PT RTRN 9FT ADLT (ELECTROSURGICAL) ×1 IMPLANT
EVICEL 2ML SEALANT HUMAN (Miscellaneous) ×3 IMPLANT
GLOVE BIO SURGEON STRL SZ7 (GLOVE) ×9 IMPLANT
GLOVE INDICATOR 7.5 STRL GRN (GLOVE) ×3 IMPLANT
GOWN STRL REUS W/ TWL LRG LVL3 (GOWN DISPOSABLE) ×1 IMPLANT
GOWN STRL REUS W/ TWL XL LVL3 (GOWN DISPOSABLE) ×1 IMPLANT
GOWN STRL REUS W/TWL LRG LVL3 (GOWN DISPOSABLE) ×2
GOWN STRL REUS W/TWL XL LVL3 (GOWN DISPOSABLE) ×2
HEMOSTAT SURGICEL 2X3 (HEMOSTASIS) ×9 IMPLANT
IV NS 250ML (IV SOLUTION) ×2
IV NS 250ML BAXH (IV SOLUTION) ×1 IMPLANT
KIT RM TURNOVER STRD PROC AR (KITS) ×3 IMPLANT
LABEL OR SOLS (LABEL) IMPLANT
LOOP RED MAXI  1X406MM (MISCELLANEOUS) ×4
LOOP VESSEL MAXI 1X406 RED (MISCELLANEOUS) ×2 IMPLANT
LOOP VESSEL MINI 0.8X406 BLUE (MISCELLANEOUS) ×1 IMPLANT
LOOPS BLUE MINI 0.8X406MM (MISCELLANEOUS) ×2
NEEDLE FILTER BLUNT 18X 1/2SAF (NEEDLE) ×2
NEEDLE FILTER BLUNT 18X1 1/2 (NEEDLE) ×1 IMPLANT
NEEDLE HYPO 25X1 1.5 SAFETY (NEEDLE) ×3 IMPLANT
NS IRRIG 1000ML POUR BTL (IV SOLUTION) ×3 IMPLANT
PACK BASIN MAJOR ARMC (MISCELLANEOUS) ×3 IMPLANT
PATCH CAROTID ECM VASC 1X10 (Prosthesis & Implant Heart) ×3 IMPLANT
PENCIL ELECTRO HAND CTR (MISCELLANEOUS) IMPLANT
SHUNT W TPORT 9FR PRUITT F3 (SHUNT) ×3 IMPLANT
SUT MNCRL 4-0 (SUTURE) ×2
SUT MNCRL 4-0 27XMFL (SUTURE) ×1
SUT PROLENE 6 0 BV (SUTURE) ×12 IMPLANT
SUT PROLENE 7 0 BV 1 (SUTURE) ×6 IMPLANT
SUT SILK 2 0 (SUTURE) ×2
SUT SILK 2-0 18XBRD TIE 12 (SUTURE) ×1 IMPLANT
SUT SILK 3 0 (SUTURE) ×2
SUT SILK 3-0 18XBRD TIE 12 (SUTURE) ×1 IMPLANT
SUT SILK 4 0 (SUTURE) ×2
SUT SILK 4-0 18XBRD TIE 12 (SUTURE) ×1 IMPLANT
SUT VIC AB 3-0 SH 27 (SUTURE) ×4
SUT VIC AB 3-0 SH 27X BRD (SUTURE) ×2 IMPLANT
SUTURE MNCRL 4-0 27XMF (SUTURE) ×1 IMPLANT
SYR 20CC LL (SYRINGE) ×3 IMPLANT
SYRINGE 10CC LL (SYRINGE) ×3 IMPLANT
TOWEL OR 17X26 4PK STRL BLUE (TOWEL DISPOSABLE) IMPLANT
TUBING CONNECTING 10 (TUBING) IMPLANT
TUBING CONNECTING 10' (TUBING)

## 2017-05-12 NOTE — Anesthesia Post-op Follow-up Note (Signed)
Anesthesia QCDR form completed.        

## 2017-05-12 NOTE — Anesthesia Procedure Notes (Signed)
Procedure Name: Intubation Date/Time: 05/12/2017 12:42 PM Performed by: Doreen Salvage Pre-anesthesia Checklist: Patient identified, Patient being monitored, Timeout performed, Emergency Drugs available and Suction available Patient Re-evaluated:Patient Re-evaluated prior to induction Oxygen Delivery Method: Circle system utilized Preoxygenation: Pre-oxygenation with 100% oxygen Induction Type: IV induction Ventilation: Mask ventilation without difficulty Laryngoscope Size: Mac and 3 Grade View: Grade I Tube type: Oral Tube size: 7.5 mm Number of attempts: 1 Airway Equipment and Method: Stylet Placement Confirmation: ETT inserted through vocal cords under direct vision,  positive ETCO2 and breath sounds checked- equal and bilateral Secured at: 22 cm Tube secured with: Tape Dental Injury: Teeth and Oropharynx as per pre-operative assessment

## 2017-05-12 NOTE — Anesthesia Preprocedure Evaluation (Signed)
Anesthesia Evaluation  Patient identified by MRN, date of birth, ID band Patient awake    Reviewed: Allergy & Precautions, NPO status , Patient's Chart, lab work & pertinent test results  Airway Mallampati: II       Dental  (+) Upper Dentures, Lower Dentures   Pulmonary shortness of breath and with exertion, COPD, former smoker,     + decreased breath sounds      Cardiovascular Exercise Tolerance: Good hypertension, Pt. on medications and Pt. on home beta blockers + Peripheral Vascular Disease   Rhythm:Regular Rate:Normal     Neuro/Psych negative neurological ROS  negative psych ROS   GI/Hepatic GERD  Medicated,  Endo/Other  diabetes  Renal/GU      Musculoskeletal   Abdominal (+) + obese,   Peds  Hematology negative hematology ROS (+)   Anesthesia Other Findings   Reproductive/Obstetrics                             Anesthesia Physical Anesthesia Plan  ASA: III  Anesthesia Plan: General   Post-op Pain Management:    Induction: Intravenous  PONV Risk Score and Plan:   Airway Management Planned: Oral ETT  Additional Equipment:   Intra-op Plan:   Post-operative Plan: Extubation in OR  Informed Consent: I have reviewed the patients History and Physical, chart, labs and discussed the procedure including the risks, benefits and alternatives for the proposed anesthesia with the patient or authorized representative who has indicated his/her understanding and acceptance.     Plan Discussed with: CRNA  Anesthesia Plan Comments:         Anesthesia Quick Evaluation

## 2017-05-12 NOTE — Op Note (Signed)
[facility]   OPERATIVE NOTE  PROCEDURE:   1.  Right carotid endarterectomy with CorMatrix arterial patch reconstruction  PRE-OPERATIVE DIAGNOSIS: 1.  Right carotid stenosis   POST-OPERATIVE DIAGNOSIS: same as above   SURGEON: Leotis Pain, MD  ASSISTANT(S): none  ANESTHESIA: general  ESTIMATED BLOOD LOSS: 400 cc  FINDING(S): 1.  right carotid plaque extensive and heavily calcified.  SPECIMEN(S):  Carotid plaque (sent to Pathology)  INDICATIONS:   [name] is a 66 y.o. male who presents with right carotid stenosis of >90%.  I discussed with the patient the risks, benefits, and alternatives to carotid endarterectomy.  I discussed the differences between carotid stenting and carotid endarterectomy. I discussed the procedural details of carotid endarterectomy with the patient.  The patient is aware that the risks of carotid endarterectomy include but are not limited to: bleeding, infection, stroke, myocardial infarction, death, cranial nerve injuries both temporary and permanent, neck hematoma, possible airway compromise, labile blood pressure post-operatively, cerebral hyperperfusion syndrome, and possible need for additional interventions in the future. The patient is aware of the risks and agrees to proceed forward with the procedure.  DESCRIPTION: After full informed written consent was obtained from the patient, the patient was brought back to the operating room and placed supine upon the operating table.  Prior to induction, the patient received IV antibiotics.  After obtaining adequate anesthesia, the patient was placed into a modified beach chair position with a shoulder roll in place and the patient's neck slightly hyperextended and rotated away from the surgical site.  The patient was prepped in the standard fashion for a carotid endarterectomy.  I made an incision anterior to the sternocleidomastoid muscle and dissected down through the subcutaneous  tissue.  The platysmas was opened with electrocautery.  Then I dissected down to the internal jugular vein and facial vein.  The facial vein is ligated and divided between 2-0 silk ties.  This was dissected posteriorly until I obtained visualization of the common carotid artery.  This was dissected out and then a vessel loop was placed around the common carotid artery. The dissection was extensive and the proximal incision had to be extended due to the proximal extent of heavily calcified disease. I then dissected in a periadventitial fashion along the common carotid artery up to the bifurcation.  I then identified the external carotid artery and the superior thyroid artery.  I placed a vessel loop around the superior thyroid artery, and I also dissected out the external carotid artery and placed a vessel loop around it. In the process of this dissection, the hypoglossal nerve was identified and protected from harm. The extent of the disease was quite high, and a fair bit of dissection was required to free the hypoglossal nerve and protect it from harm.  I then dissected out the internal carotid artery until I identified an area in the internal carotid artery clearly above the stenosis. This was about as high as could be dissected surgically.  I dissected slightly distal to this area, and placed a vessel loop around the artery.  At this point, we gave the patient 6000 units of intravenous heparin.  After this was allowed to circulate for several minutes, I pulled up control on the vessel loops to clamp the internal carotid artery, external carotid artery, superior thyroid artery, and then the common carotid artery.  I then made an arteriotomy in the common carotid artery with a 11 blade, and extended the arteriotomy with a Potts  scissor down into the common carotid artery, then I carried the arteriotomy through the bifurcation into the internal carotid artery until I reached an area that was not diseased.  At this  point, I took the [place] shunt that previously been prepared and I inserted it into the internal carotid artery first, and then into the common carotid artery taking care to flush and de-air prior to release of control. At this point, I started the endarterectomy in the common carotid artery with a [place] elevator and carried this dissection down into the common carotid artery circumferentially.  Then I transected the plaque at a segment where it was adherent and transected the plaque with Potts scissors.  I then carried this dissection up into the external carotid artery.  The plaque was extracted by unclamping the external carotid artery and performing an eversion endarterectomy.  The dissection was then carried into the internal carotid artery where a nice feathered end point was created with gentle traction.  I passed the plaque off the field as a specimen. At this point I removed all loose flecks and remaining disease possible.  At this point, I was satisfied that the minimal remaining disease was densely adherent to the wall and wall integrity was intact. The distal endpoint was tacked down with three 7-0 Prolene sutures.  I then fashioned a CorMatrix arterial patch for the artery and sewed it in place with two running stitch of 6-0 Prolene.  I started at the distal endpoint and ran one half the length of the arteriotomy.  I then beveled the patch to match the arteriotomy proximally.  The entire length of the patch was used.  I started the second 6-0 Prolene at the proximal end point.  The medial suture line was completed and the lateral suture line was run approximately one quarter the length of the arteriotomy.  Prior to completing this patch angioplasty, I removed the shunt first from the internal carotid artery, from which there was excellent backbleeding, and clamped it.  Then I removed the shunt from the common carotid artery, from which there was excellent antegrade bleeding, and then clamped  it.  At this point, I allowed the external carotid artery to backbleed, which was excellent.  Then I instilled heparinized saline in this patched artery and then completed the patch angioplasty in the usual fashion.  First, I released the clamp on the external carotid artery, then I released it on the common carotid artery.  After waiting a few seconds, I then released it on the internal carotid artery. Several minutes of pressure were held and 6-0 Prolene patch sutures were used as need for hemostasis.  At this point, I placed Surgicel and Evicel topical hemostatic agents.  There was no more active bleeding in the surgical site.  The sternocleidomastoid space was closed with three interrupted 3-0 Vicryl sutures. I then reapproximated the platysma muscle with a running stitch of 3-0 Vicryl.  The skin was then closed with a running subcuticular 4-0 Monocryl.  The skin was then cleaned, dried and Dermabond was used to reinforce the skin closure.  The patient awakened and was taken to the recovery room in stable condition, following commands and moving all four extremities without any apparent deficits.    COMPLICATIONS: none  CONDITION: stable  Leotis Pain  05/12/2017, 3:23 PM    This note was created with Dragon Medical transcription system. Any errors in dictation are purely unintentional.

## 2017-05-12 NOTE — OR Nursing (Signed)
6000 units Heparin given by anesthesia @ 1338

## 2017-05-12 NOTE — H&P (Signed)
[facility] Admission History & Physical  MRN : [id number]  [name] is a 66 y.o. (1950/10/07) male who presents with chief complaint of No chief complaint on file. Marland Kitchen  History of Present Illness: Patient presents for right CEA.  Has had extensive workup and has >80% right ICA stenosis.  Other medical issues as below.  No stroke, TIA, or amaurosis symptoms recently. No fever or chills.  No new complaints today.  Current Facility-Administered Medications  Medication Dose Route Frequency Provider Last Rate Last Dose  . 0.9 %  sodium chloride infusion   Intravenous Continuous [name], MD 20 mL/hr at 05/12/17 1106    . 0.9 %  sodium chloride infusion   Intravenous Continuous [place] Service, Jane Canary, MD 50 mL/hr at 05/12/17 1108    . clindamycin (CLEOCIN) IVPB 300 mg  300 mg Intravenous Once [name], MD        Past Medical History:  Diagnosis Date  . COPD (chronic obstructive pulmonary disease) ([place])   . Diabetes mellitus without complication ([place])   . Dyspnea   . Fatty liver 2008  . Fracture of clavicle 1992   left   . Fracture of ribs, multiple 1992   3 ribs  . GERD (gastroesophageal reflux disease)   . Hypertension   . Paroxysmal SVT (supraventricular tachycardia) ([place])   . Pelvis fracture ([place]) 1992    Past Surgical History:  Procedure Laterality Date  . AMPUTATION TOE Left 02/25/2017   Procedure: AMPUTATION TOE-LEFT 2ND MPJ;  Surgeon: Samara Deist, DPM;  Location: [facility] ORS;  Service: Podiatry;  Laterality: Left;  . BACK SURGERY  2008  . CHOLECYSTECTOMY    . FOOT SURGERY Right    x 3    Social History Social History  Substance Use Topics  . Smoking status: Former Smoker    Quit date: 02/24/1997  . Smokeless tobacco: Never Used  . Alcohol use No  No IVDU  Family History Family History  Problem Relation Age of Onset  . Diabetes Sister   No bleeding disorders, clotting disorders, or aneurysms  Allergies  Allergen  Reactions  . Bactrim [Sulfamethoxazole-Trimethoprim] Other (See Comments)    Mouth sores Sores develop in mouth  . Amoxicillin Rash    Sores in my mouth  . Penicillins Rash    Rash in and around the mouth Has patient had a PCN reaction causing immediate rash, facial/tongue/throat swelling, SOB or lightheadedness with hypotension: Yes Has patient had a PCN reaction causing severe rash involving mucus membranes or skin necrosis: Yes Has patient had a PCN reaction that required hospitalization: No Has patient had a PCN reaction occurring within the last 10 years: Yes If all of the above answers are "NO", then may proceed with Cephalosporin use.      REVIEW OF SYSTEMS (Negative unless checked)  Constitutional: [] Weight loss  [] Fever  [] Chills Cardiac: [] Chest pain   [] Chest pressure   [x] Palpitations   [] Shortness of breath when laying flat   [] Shortness of breath at rest   [x] Shortness of breath with exertion. Vascular:  [] Pain in legs with walking   [] Pain in legs at rest   [] Pain in legs when laying flat   [] Claudication   [] Pain in feet when walking  [] Pain in feet at rest  [] Pain in feet when laying flat   [] History of DVT   [] Phlebitis   [] Swelling in legs   [] Varicose veins   [] Non-healing ulcers Pulmonary:   [] Uses home oxygen   []   Productive cough   [] Hemoptysis   [] Wheeze  [x] COPD   [] Asthma Neurologic:  [] Dizziness  [] Blackouts   [] Seizures   [] History of stroke   [] History of TIA  [] Aphasia   [] Temporary blindness   [] Dysphagia   [] Weakness or numbness in arms   [] Weakness or numbness in legs Musculoskeletal:  [x] Arthritis   [] Joint swelling   [] Joint pain   [] Low back pain Hematologic:  [] Easy bruising  [] Easy bleeding   [] Hypercoagulable state   [] Anemic  [] Hepatitis Gastrointestinal:  [] Blood in stool   [] Vomiting blood  [x] Gastroesophageal reflux/heartburn   [] Difficulty swallowing. Genitourinary:  [] Chronic kidney disease   [] Difficult urination  [] Frequent urination  [] Burning  with urination   [] Blood in urine Skin:  [] Rashes   [] Ulcers   [] Wounds Psychological:  [] History of anxiety   []  History of major depression.  Physical Examination  Vitals:   05/12/17 1048  BP: (!) 148/98  Pulse: 65  Resp: 18  Temp: 97.8 F (36.6 C)  TempSrc: Tympanic  SpO2: 98%  Weight: 82.6 kg (182 lb)  Height: 5\' 10"  (1.778 m)   Body mass index is 26.11 kg/m. Gen: WD/WN, NAD Head: [place]/AT, No temporalis wasting.  Ear/Nose/Throat: Hearing grossly intact, nares w/o erythema or drainage, oropharynx w/o Erythema/Exudate,  Eyes: Conjunctiva clear, sclera non-icteric Neck: Trachea midline.  No JVD.  Pulmonary:  Good air movement, respirations not labored, no use of accessory muscles.  Cardiac: RRR, normal S1, S2. Vascular:  Vessel Right Left  Radial Palpable Palpable  Ulnar Palpable Palpable  Brachial Palpable Palpable  Carotid Palpable, with bruit Palpable, without bruit                        Musculoskeletal: M/S 5/5 throughout.  Extremities without ischemic changes.  No deformity or atrophy.  Neurologic: Sensation grossly intact in extremities.  Symmetrical.  Speech is fluent. Motor exam as listed above. Psychiatric: Judgment intact, Mood & affect appropriate for pt's clinical situation. Dermatologic: No rashes or ulcers noted.  No cellulitis or open wounds.      CBC Lab Results  Component Value Date   WBC 4.3 05/05/2017   HGB 13.3 05/12/2017   HCT 39.0 05/12/2017   MCV 88.9 05/05/2017   PLT 127 (L) 05/05/2017    BMET    Component Value Date/Time   NA 140 05/12/2017 1058   NA 136 04/17/2014 1026   K 4.5 05/12/2017 1058   K 5.1 04/17/2014 1026   CL 99 (L) 05/05/2017 0833   CL 103 04/17/2014 1026   CO2 29 05/05/2017 0833   CO2 28 04/17/2014 1026   GLUCOSE 173 (H) 05/12/2017 1058   GLUCOSE 124 (H) 04/17/2014 1026   BUN 23 (H) 05/05/2017 0833   BUN 28 (H) 04/17/2014 1026   CREATININE 1.33 (H) 05/05/2017 0833   CREATININE 1.30 04/17/2014 1026    CALCIUM 9.6 05/05/2017 0833   CALCIUM 8.6 04/17/2014 1026   GFRNONAA 55 (L) 05/05/2017 0833   GFRNONAA 59 (L) 04/17/2014 1026   GFRAA >60 05/05/2017 0833   GFRAA >60 04/17/2014 1026   Estimated Creatinine Clearance: 58.7 mL/min (A) (by C-G formula based on SCr of 1.33 mg/dL (H)).  COAG Lab Results  Component Value Date   INR 0.97 05/05/2017    Radiology No results found.    Assessment/Plan 1. High grade right carotid stenosis.  For CEA today.  Risks and benefits discussed. 2. DM. Stable on outpatient medications and blood glucose control important in reducing the  progression of atherosclerotic disease. Also, involved in wound healing. On appropriate medications. 3. HTN. Stable on outpatient medications and blood pressure control important in reducing the progression of atherosclerotic disease. On appropriate oral medications.    Leotis Pain, MD  05/12/2017 11:30 AM

## 2017-05-12 NOTE — Anesthesia Procedure Notes (Addendum)
Arterial Line Insertion Start/End8/16/2018 12:42 PM, 05/12/2017 1:00 PM Performed by: Fayne Norrie, attending, CRNA  Patient location: Pre-op. Preanesthetic checklist: patient identified, IV checked, site marked, risks and benefits discussed, surgical consent, monitors and equipment checked, pre-op evaluation, timeout performed and anesthesia consent Lidocaine 1% used for infiltration radial was placed Catheter size: 20 Fr Hand hygiene performed  and maximum sterile barriers used   Attempts: 4 Procedure performed using ultrasound guided technique. Ultrasound Notes:anatomy identified and needle tip was noted to be adjacent to the nerve/plexus identified Following insertion, dressing applied. Post procedure assessment: normal and unchanged  Additional procedure comments: Insertion of arterial line attempted x2 by L. Industrial/product designer. Dr. Lucky Cowboy at [facility] line inserted using ultrasound x 2 attempts.

## 2017-05-12 NOTE — Progress Notes (Signed)
eLink Physician-Brief Progress Note Patient Name: KYRIAN STAGE DOB: October 29, 1950 MRN: [id number]   Date of Service  05/12/2017  HPI/Events of Note  Right carotid endarterectomy  Chart reviewed Stable on cam check  eICU Interventions  No eicu interventions     Intervention Category Evaluation Type: New Patient Evaluation  Lasasha Brophy 05/12/2017, 5:20 PM

## 2017-05-12 NOTE — Transfer of Care (Signed)
Immediate Anesthesia Transfer of Care Note  Patient: [name]  Procedure(s) Performed: Procedure(s): ENDARTERECTOMY CAROTID (Right)  Patient Location: PACU  Anesthesia Type:General  Level of Consciousness: sedated  Airway & Oxygen Therapy: Patient Spontanous Breathing and Patient connected to face mask oxygen  Post-op Assessment: Report given to RN and Post -op Vital signs reviewed and stable  Post vital signs: Reviewed and stable  Last Vitals:  Vitals:   05/12/17 1525 05/12/17 1530  BP: 114/75 114/75  Pulse: 75 76  Resp:  13  Temp: (!) 35.7 C 37.5 C  SpO2:  88%    Complications: No apparent anesthesia complications

## 2017-05-13 ENCOUNTER — Encounter: Payer: Self-pay | Admitting: Vascular Surgery

## 2017-05-13 LAB — CBC
HCT: 37 % — ABNORMAL LOW (ref 40.0–52.0)
Hemoglobin: 13 g/dL (ref 13.0–18.0)
MCH: 30.4 pg (ref 26.0–34.0)
MCHC: 35.1 g/dL (ref 32.0–36.0)
MCV: 86.8 fL (ref 80.0–100.0)
PLATELETS: 119 10*3/uL — AB (ref 150–440)
RBC: 4.27 MIL/uL — ABNORMAL LOW (ref 4.40–5.90)
RDW: 14.9 % — AB (ref 11.5–14.5)
WBC: 6.1 10*3/uL (ref 3.8–10.6)

## 2017-05-13 LAB — BASIC METABOLIC PANEL
ANION GAP: 7 (ref 5–15)
BUN: 21 mg/dL — AB (ref 6–20)
CALCIUM: 7.8 mg/dL — AB (ref 8.9–10.3)
CO2: 24 mmol/L (ref 22–32)
Chloride: 105 mmol/L (ref 101–111)
Creatinine, Ser: 1.03 mg/dL (ref 0.61–1.24)
GFR calc Af Amer: >60 mL/min (ref >60)
GLUCOSE: 230 mg/dL — AB (ref 65–99)
Potassium: 4.8 mmol/L (ref 3.5–5.1)
Sodium: 136 mmol/L (ref 135–145)

## 2017-05-13 MED ORDER — HYDROCODONE-ACETAMINOPHEN 5-325 MG PO TABS: 1.0000 | ORAL_TABLET | Freq: Four times a day (QID) | ORAL | 0 refills | Status: DC | PRN

## 2017-05-13 MED ORDER — CLOPIDOGREL BISULFATE 75 MG PO TABS: 75.0000 mg | ORAL_TABLET | Freq: Every day | ORAL | 5 refills | Status: DC

## 2017-05-13 MED ORDER — FAMOTIDINE 20 MG PO TABS: 20.0000 mg | ORAL_TABLET | Freq: Two times a day (BID) | ORAL | Status: DC

## 2017-05-13 NOTE — Progress Notes (Signed)
Patient's labs drawn this morning then ART line removed with pressure dressing applied. Foley was discontinued as per order. Patient given a urinal. Medicated as needed throughout the night for pain. 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 Pt tolerating PO intake of water well. Able to make needs known and is very kind to staff.

## 2017-05-13 NOTE — Progress Notes (Signed)
Inpatient Diabetes Program Recommendations  AACE/ADA: New Consensus Statement on Inpatient Glycemic Control (2015)  Target Ranges:  Prepandial:   less than 140 mg/dL      Peak postprandial:   less than 180 mg/dL (1-2 hours)      Critically ill patients:  140 - 180 mg/dL   Lab Results  Component Value Date   GLUCAP 216 (H) 05/12/2017   HGBA1C 6.4 (H) 07/10/2015      Inpatient Diabetes Program Recommendations:  No CBG orders but patient has a history of diabetes and takes Diabeta at home and it is ordered here.  Discussed with RN- recommend CBG checks tid while patient is in hospital.  Gentry Fitz, RN, BA, MHA, CDE Diabetes Coordinator Inpatient Diabetes Program  [phone] (Team Pager) [phone] ([place]) 05/13/2017 8:56 AM

## 2017-05-13 NOTE — Progress Notes (Signed)
Pt able to ambulate to commode and void. Pt N&V resolved. Pt able to tolerate lunch well. Pt ambulated once around unit and tolerated well. Will continue to assess.

## 2017-05-13 NOTE — Progress Notes (Signed)
Pt discharged in stable condition. Pt left with paper prescriptions in hand. Verbalized understanding of discharge teaching.Pt transported to door by volunteer services. All belongings with patient. Pt sister driving patient home.

## 2017-05-13 NOTE — Anesthesia Postprocedure Evaluation (Signed)
Anesthesia Post Note  Patient: [name]  Procedure(s) Performed: Procedure(s) (LRB): ENDARTERECTOMY CAROTID (Right)  Patient location during evaluation: ICU Anesthesia Type: General Level of consciousness: awake, awake and alert and oriented Pain management: pain level controlled Vital Signs Assessment: post-procedure vital signs reviewed and stable Respiratory status: spontaneous breathing, nonlabored ventilation and respiratory function stable Cardiovascular status: stable Anesthetic complications: no     Last Vitals:  Vitals:   05/13/17 0500 05/13/17 0600  BP: 95/76 98/74  Pulse: 86 87  Resp: 10 (!) 9  Temp:    SpO2: 94% 93%    Last Pain:  Vitals:   05/13/17 0400  TempSrc: Oral  PainSc: [place]

## 2017-05-13 NOTE — Op Note (Signed)
[facility]   OPERATIVE NOTE  DATE: 05/12/2017  PRE-OPERATIVE DIAGNOSIS: carotid stenosis  POST-OPERATIVE DIAGNOSIS: Same as above  PROCEDURE: 1.   Right radial arterial line placement with ultrasound guidance  SURGEON: Leotis Pain  ASSISTANT(S): None  ANESTHESIA: None  ESTIMATED BLOOD LOSS: 5 cc  FINDING(S): 1.  none  SPECIMEN(S):  None  INDICATIONS:   Patient is a 66 y.o.male who presents with carotid stenosis and planned CEA. We are placing the arterial line for invasive hemodynamic monitoring and arterial access for blood draws and ABGs.  DESCRIPTION: The patient's right wrist was prepped and draped. The right radial artery was visualized with ultrasound. This was found to be patent. It was then accessed with minimal difficulty with a micropuncture needle and a permanent image was recorded. A micropuncture wire was then placed. A 5 French micropuncture sheath was then placed over the wire and the wire and dilator were removed. This was secured and connected to the appropriate monitors where a brisk arterial tracing was seen. The patient tolerated the procedure well without complications.   COMPLICATIONS: None  CONDITION: Stable   Leotis Pain  05/13/2017 1:19 PM  This note was created with Dragon Medical transcription system. Any errors in dictation are purely unintentional.

## 2017-05-13 NOTE — Discharge Summary (Signed)
[facility]    Discharge Summary    Patient ID:  [name] MRN: [id number] DOB/AGE: November 10, 1950 66 y.o.  Admit date: 05/12/2017 Discharge date: 05/13/2017 Date of Surgery: 05/12/2017 Surgeon: Surgeon(s): Lucky Cowboy Erskine Squibb, MD  Admission Diagnosis: CAROTID ARTERY STENOSIS  Discharge Diagnoses:  CAROTID ARTERY STENOSIS  Secondary Diagnoses: Past Medical History:  Diagnosis Date  . COPD (chronic obstructive pulmonary disease) ([place])   . Diabetes mellitus without complication ([place])   . Dyspnea   . Fatty liver 2008  . Fracture of clavicle 1992   left   . Fracture of ribs, multiple 1992   3 ribs  . GERD (gastroesophageal reflux disease)   . Hypertension   . Paroxysmal SVT (supraventricular tachycardia) ([place])   . Pelvis fracture ([place]) 1992    Procedure(s): ENDARTERECTOMY CAROTID  Discharged Condition: good  HPI:  Patient with high grade right ICA stenosis. Here for elective CEA  Hospital Course:  [name] is a 66 y.o. male is S/P Right Procedure(s): ENDARTERECTOMY CAROTID Extubated: POD # 0 Physical exam: neuro exam normal, neck with mild swelling, AF/VSS Post-op wounds clean, dry, intact or healing well Pt. Ambulating, voiding and taking PO diet without difficulty. Pt pain controlled with PO pain meds. Labs as below Complications:none  Consults:    Significant Diagnostic Studies: CBC Lab Results  Component Value Date   WBC 6.1 05/13/2017   HGB 13.0 05/13/2017   HCT 37.0 (L) 05/13/2017   MCV 86.8 05/13/2017   PLT 119 (L) 05/13/2017    BMET    Component Value Date/Time   NA 136 05/13/2017 0339   NA 136 04/17/2014 1026   K 4.8 05/13/2017 0339   K 5.1 04/17/2014 1026   CL 105 05/13/2017 0339   CL 103 04/17/2014 1026   CO2 24 05/13/2017 0339   CO2 28 04/17/2014 1026   GLUCOSE 230 (H) 05/13/2017 0339   GLUCOSE 124 (H) 04/17/2014 1026   BUN 21 (H) 05/13/2017 0339   BUN 28 (H) 04/17/2014 1026   CREATININE 1.03  05/13/2017 0339   CREATININE 1.30 04/17/2014 1026   CALCIUM 7.8 (L) 05/13/2017 0339   CALCIUM 8.6 04/17/2014 1026   GFRNONAA >60 05/13/2017 0339   GFRNONAA 59 (L) 04/17/2014 1026   GFRAA >60 05/13/2017 0339   GFRAA >60 04/17/2014 1026   COAG Lab Results  Component Value Date   INR 0.97 05/05/2017     Disposition:  Discharge to :Home Discharge Instructions    Call MD for:  redness, tenderness, or signs of infection (pain, swelling, bleeding, redness, odor or green/yellow discharge around incision site)    Complete by:  As directed    Call MD for:  severe or increased pain, loss or decreased feeling  in affected limb(s)    Complete by:  As directed    Call MD for:  temperature >100.5    Complete by:  As directed    Discharge instructions    Complete by:  As directed    Return to work in three weeks.   Driving Restrictions    Complete by:  As directed    No driving for one week   Lifting restrictions    Complete by:  As directed    No lifting for one week   No dressing needed    Complete by:  As directed    Replace only if drainage present   Resume previous diet    Complete by:  As directed  Allergies as of 05/13/2017      Reactions   Bactrim [sulfamethoxazole-trimethoprim] Other (See Comments)   Mouth sores Sores develop in mouth   Amoxicillin Rash   Sores in my mouth   Penicillins Rash   Rash in and around the mouth Has patient had a PCN reaction causing immediate rash, facial/tongue/throat swelling, SOB or lightheadedness with hypotension: Yes Has patient had a PCN reaction causing severe rash involving mucus membranes or skin necrosis: Yes Has patient had a PCN reaction that required hospitalization: No Has patient had a PCN reaction occurring within the last 10 years: Yes If all of the above answers are "NO", then may proceed with Cephalosporin use.      Medication List    TAKE these medications   aspirin EC 81 MG tablet Take 81 mg by mouth daily.    budesonide-formoterol 160-4.5 MCG/ACT inhaler Commonly known as:  SYMBICORT Inhale 2 puffs into the lungs 2 (two) times daily.   CALCIUM PLUS VITAMIN D PO Take 1 tablet by mouth daily.   clopidogrel 75 MG tablet Commonly known as:  PLAVIX Take 1 tablet (75 mg total) by mouth daily with breakfast.   diphenhydrAMINE 25 mg capsule Commonly known as:  BENADRYL Take 25 mg by mouth every 6 (six) hours as needed for itching or allergies.   famotidine 20 MG tablet Commonly known as:  PEPCID Take 20 mg by mouth daily as needed for heartburn or indigestion.   FISH OIL PO Take 1 capsule by mouth daily.   gabapentin 100 MG capsule Commonly known as:  NEURONTIN Take 200 mg by mouth 3 (three) times daily.   glyBURIDE 2.5 MG tablet Commonly known as:  DIABETA Take 2.5 mg by mouth 2 (two) times daily with a meal.   HYDROcodone-acetaminophen 5-325 MG tablet Commonly known as:  NORCO Take 1-2 tablets by mouth every 6 (six) hours as needed for moderate pain. What changed:  how much to take   ipratropium 17 MCG/ACT inhaler Commonly known as:  ATROVENT HFA Inhale 2 puffs into the lungs every 4 (four) hours as needed for wheezing.   lisinopril 5 MG tablet Commonly known as:  PRINIVIL,ZESTRIL Take 5 mg by mouth daily. In am.   lovastatin 10 MG tablet Commonly known as:  MEVACOR Take 10 mg by mouth at bedtime.   metoprolol succinate 100 MG 24 hr tablet Commonly known as:  TOPROL-XL Take 1 tablet (100 mg total) by mouth daily. Take with or immediately following a meal. What changed:  additional instructions   traMADol 50 MG tablet Commonly known as:  ULTRAM Take 50 mg by mouth 2 (two) times daily as needed for moderate pain or severe pain.   vitamin B-12 1000 MCG tablet Commonly known as:  CYANOCOBALAMIN Take 1,000 mcg by mouth daily.      Verbal and written Discharge instructions given to the patient. Wound care per Discharge AVS Follow-up Information    Dew, Erskine Squibb, MD  Follow up in 3 week(s).   Specialties:  Vascular Surgery, Radiology, Interventional Cardiology Why:  with carotid duplex Contact information: [place] [address] [phone]           Signed: Leotis Pain, MD  05/13/2017, 8:27 AM

## 2017-05-13 NOTE — Progress Notes (Signed)
[facility]  Daily Progress Note   Subjective  - 1 Day Post-Op  No events overnight.  Feels ok. Wants regular food  Objective Vitals:   05/13/17 0500 05/13/17 0600 05/13/17 0700 05/13/17 0800  BP: 95/76 98/74 (!) 129/93   Pulse: 86 87 93   Resp: 10 (!) 9 18   Temp:    98.1 F (36.7 C)  TempSrc:    Oral  SpO2: 94% 93% 92%   Weight:      Height:        Intake/Output Summary (Last 24 hours) at 05/13/17 0824 Last data filed at 05/13/17 0800  Gross per 24 hour  Intake          4333.75 ml  Output             3345 ml  Net           988.75 ml    PULM  CTAB CV  RRR VASC  Neck with mild swelling and bruising, neuro exam intact  Laboratory CBC    Component Value Date/Time   WBC 6.1 05/13/2017 0339   HGB 13.0 05/13/2017 0339   HGB 15.2 04/17/2014 1026   HCT 37.0 (L) 05/13/2017 0339   HCT 44.6 04/17/2014 1026   PLT 119 (L) 05/13/2017 0339   PLT 127 (L) 04/17/2014 1026    BMET    Component Value Date/Time   NA 136 05/13/2017 0339   NA 136 04/17/2014 1026   K 4.8 05/13/2017 0339   K 5.1 04/17/2014 1026   CL 105 05/13/2017 0339   CL 103 04/17/2014 1026   CO2 24 05/13/2017 0339   CO2 28 04/17/2014 1026   GLUCOSE 230 (H) 05/13/2017 0339   GLUCOSE 124 (H) 04/17/2014 1026   BUN 21 (H) 05/13/2017 0339   BUN 28 (H) 04/17/2014 1026   CREATININE 1.03 05/13/2017 0339   CREATININE 1.30 04/17/2014 1026   CALCIUM 7.8 (L) 05/13/2017 0339   CALCIUM 8.6 04/17/2014 1026   GFRNONAA >60 05/13/2017 0339   GFRNONAA 59 (L) 04/17/2014 1026   GFRAA >60 05/13/2017 0339   GFRAA >60 04/17/2014 1026    Assessment/Planning: POD #1 s/p right CEA   Doing well  Advance diet  Home today    Leotis Pain  05/13/2017, 8:24 AM

## 2017-05-13 NOTE — Progress Notes (Signed)
Pt called this nurse to room complaining of feeling nauseous. Pt experienced one episode of vomiting. Pepcid and Zofran given per PRN orders. Will continue to assess.

## 2017-05-13 NOTE — Care Management (Signed)
ICU RN notified RNCM of patient expression of concern about cost of being in the hospital. Contact numbers shared with patient and his sister to reach out to [facility] for advice. His insurance is [employer] through his employer. 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 No current RNCM needs.

## 2017-05-16 LAB — SURGICAL PATHOLOGY

## 2017-05-23 ENCOUNTER — Encounter: Payer: Self-pay | Admitting: Vascular Surgery

## 2017-06-08 ENCOUNTER — Encounter (INDEPENDENT_AMBULATORY_CARE_PROVIDER_SITE_OTHER): Payer: Self-pay | Admitting: Vascular Surgery

## 2017-06-08 ENCOUNTER — Other Ambulatory Visit (INDEPENDENT_AMBULATORY_CARE_PROVIDER_SITE_OTHER): Payer: Self-pay | Admitting: Vascular Surgery

## 2017-06-08 ENCOUNTER — Ambulatory Visit (INDEPENDENT_AMBULATORY_CARE_PROVIDER_SITE_OTHER): Payer: 59

## 2017-06-08 ENCOUNTER — Ambulatory Visit (INDEPENDENT_AMBULATORY_CARE_PROVIDER_SITE_OTHER): Payer: 59 | Admitting: Vascular Surgery

## 2017-06-08 VITALS — BP 134/70 | HR 66 | Resp 16 | Ht 70.0 in | Wt 180.0 lb

## 2017-06-08 DIAGNOSIS — Z9889 Other specified postprocedural states: Secondary | ICD-10-CM | POA: Diagnosis not present

## 2017-06-08 DIAGNOSIS — I6523 Occlusion and stenosis of bilateral carotid arteries: Secondary | ICD-10-CM

## 2017-06-08 DIAGNOSIS — I739 Peripheral vascular disease, unspecified: Secondary | ICD-10-CM

## 2017-06-08 DIAGNOSIS — E118 Type 2 diabetes mellitus with unspecified complications: Secondary | ICD-10-CM

## 2017-06-08 LAB — VAS US CAROTID
LEFT ECA DIAS: -19 cm/s
LICADDIAS: -18 cm/s
LICADSYS: -78 cm/s
LICAPDIAS: -18 cm/s
Left CCA dist dias: 18 cm/s
Left CCA dist sys: 105 cm/s
Left CCA prox dias: 19 cm/s
Left CCA prox sys: 140 cm/s
Left ICA prox sys: -125 cm/s
RCCADSYS: -73 cm/s
RCCAPDIAS: 19 cm/s
RIGHT CCA MID DIAS: -21 cm/s
Right CCA prox sys: 89 cm/s

## 2017-06-08 NOTE — Progress Notes (Signed)
Subjective:    Patient ID: [name], male    DOB: 12-15-1950, 66 y.o.   MRN: [id number] Chief Complaint  Patient presents with  . Follow-up    3 week f/u Carotid   Patient presents for his first post operative follow-up. He is status post a right carotid endarterectomy on 05/12/2017. He presents today without complaint. The patient denies experiencing Amaurosis Fugax, TIA like symptoms or focal motor deficits. Denies any issues with his surgical incision postoperatively. Patient underwent a bilateral carotid artery duplex exam which was notable for a patent right carotid endarterectomy site with Doppler velocities suggestive of greater than 50% right proximal/mid common carotid artery stenosis at the proximal patch. Mild/moderate increase in the right proximal internal carotid artery velocity which appears to be mostly due to change in vessel diameter in vessel tortuosity. The velocities suggesting greater than 50% stenosis of the left distal common carotid artery bifurcation resulting in a mildly increased left internal carotid artery velocity.    Review of Systems  Constitutional: Negative.   HENT: Negative.   Eyes: Negative.   Respiratory: Negative.   Cardiovascular: Negative.   Gastrointestinal: Negative.   Endocrine: Negative.   Genitourinary: Negative.   Musculoskeletal: Negative.   Skin: Negative.   Allergic/Immunologic: Negative.   Neurological: Negative.   Hematological: Negative.   Psychiatric/Behavioral: Negative.       Objective:   Physical Exam  Constitutional: He is oriented to person, place, and time. He appears well-developed and well-nourished. No distress.  HENT:  Head: Normocephalic and atraumatic.  Eyes: Pupils are equal, round, and reactive to light. Conjunctivae are normal.  Neck: Normal range of motion.    Incision healing well. Clean dry and intact.  Cardiovascular: Normal rate, regular rhythm, normal heart sounds and intact distal pulses.     Pulses:      Radial pulses are 2+ on the right side, and 2+ on the left side.  Pulmonary/Chest: Effort normal.  Musculoskeletal: Normal range of motion. He exhibits no edema.  Neurological: He is alert and oriented to person, place, and time.  Skin: Skin is warm and dry. He is not diaphoretic.  Psychiatric: He has a normal mood and affect. His behavior is normal. Judgment and thought content normal.  Vitals reviewed.   BP 134/70 (BP Location: Right Arm)   Pulse 66   Resp 16   Ht 5\' 10"  (1.778 m)   Wt 180 lb (81.6 kg)   BMI 25.83 kg/m   Past Medical History:  Diagnosis Date  . COPD (chronic obstructive pulmonary disease) ([place])   . Diabetes mellitus without complication ([place])   . Dyspnea   . Fatty liver 2008  . Fracture of clavicle 1992   left   . Fracture of ribs, multiple 1992   3 ribs  . GERD (gastroesophageal reflux disease)   . Hypertension   . Paroxysmal SVT (supraventricular tachycardia) ([place])   . Pelvis fracture ([place]) 1992    Social History   Social History  . Marital status: Single    Spouse name: N/A  . Number of children: N/A  . Years of education: N/A   Occupational History  . Not on file.   Social History Main Topics  . Smoking status: Former Smoker    Quit date: 02/24/1997  . Smokeless tobacco: Never Used  . Alcohol use No  . Drug use: No  . Sexual activity: Not on file   Other Topics Concern  . Not on file   Social History  Narrative  . No narrative on file    Past Surgical History:  Procedure Laterality Date  . AMPUTATION TOE Left 02/25/2017   Procedure: AMPUTATION TOE-LEFT 2ND MPJ;  Surgeon: Samara Deist, DPM;  Location: [facility] ORS;  Service: Podiatry;  Laterality: Left;  . BACK SURGERY  2008  . CHOLECYSTECTOMY    . ENDARTERECTOMY Right 05/12/2017   Procedure: ENDARTERECTOMY CAROTID;  Surgeon: [name], MD;  Location: [facility] ORS;  Service: Vascular;  Laterality: Right;  . FOOT SURGERY Right    x 3    Family History  Problem  Relation Age of Onset  . Diabetes Sister     Allergies  Allergen Reactions  . Bactrim [Sulfamethoxazole-Trimethoprim] Other (See Comments)    Mouth sores Sores develop in mouth  . Amoxicillin Rash    Sores in my mouth  . Penicillins Rash    Rash in and around the mouth Has patient had a PCN reaction causing immediate rash, facial/tongue/throat swelling, SOB or lightheadedness with hypotension: Yes Has patient had a PCN reaction causing severe rash involving mucus membranes or skin necrosis: Yes Has patient had a PCN reaction that required hospitalization: No Has patient had a PCN reaction occurring within the last 10 years: Yes If all of the above answers are "NO", then may proceed with Cephalosporin use.        Assessment & Plan:  Patient presents for his first post operative follow-up. He is status post a right carotid endarterectomy on 05/12/2017. He presents today without complaint. The patient denies experiencing Amaurosis Fugax, TIA like symptoms or focal motor deficits. Denies any issues with his surgical incision postoperatively. Patient underwent a bilateral carotid artery duplex exam which was notable for a patent right carotid endarterectomy site with Doppler velocities suggestive of greater than 50% right proximal/mid common carotid artery stenosis at the proximal patch. Mild/moderate increase in the right proximal internal carotid artery velocity which appears to be mostly due to change in vessel diameter in vessel tortuosity. The velocities suggesting greater than 50% stenosis of the left distal common carotid artery bifurcation resulting in a mildly increased left internal carotid artery velocity.   1. Bilateral carotid artery stenosis - Status post right common carotid endarterectomy - New Patient is doing well postoperatively. Noncritical carotid Doppler. Patient like to go back to work starting next week. Letter given to patient. Patient follow-up in 3 months with  surveillance carotid Doppler.  - VAS US CAROTID; Future  2. Type 2 diabetes mellitus with complication, unspecified whether long term insulin use (HCC) - stable Encouraged good control as its slows the progression of atherosclerotic disease  3. PAD (peripheral artery disease) ([facility]) - stable Followed on a regular basis. He is currently asymptomatic at this time.  Current Outpatient Prescriptions on File Prior to Visit  Medication Sig Dispense Refill  . aspirin EC 81 MG tablet Take 81 mg by mouth daily.    . budesonide-formoterol (SYMBICORT) 160-4.5 MCG/ACT inhaler Inhale 2 puffs into the lungs 2 (two) times daily.    . Calcium Carbonate-Vitamin D (CALCIUM PLUS VITAMIN D PO) Take 1 tablet by mouth daily.    . clopidogrel (PLAVIX) 75 MG tablet Take 1 tablet (75 mg total) by mouth daily with breakfast. 30 tablet 5  . diphenhydrAMINE (BENADRYL) 25 mg capsule Take 25 mg by mouth every 6 (six) hours as needed for itching or allergies.     . famotidine (PEPCID) 20 MG tablet Take 20 mg by mouth daily as needed for heartburn or indigestion.    Marland Kitchen  gabapentin (NEURONTIN) 100 MG capsule Take 200 mg by mouth 3 (three) times daily.     Marland Kitchen glyBURIDE (DIABETA) 2.5 MG tablet Take 2.5 mg by mouth 2 (two) times daily with a meal.    . HYDROcodone-acetaminophen (NORCO) 5-325 MG tablet Take 1-2 tablets by mouth every 6 (six) hours as needed for moderate pain. 30 tablet 0  . ipratropium (ATROVENT HFA) 17 MCG/ACT inhaler Inhale 2 puffs into the lungs every 4 (four) hours as needed for wheezing.    Marland Kitchen lisinopril (PRINIVIL,ZESTRIL) 5 MG tablet Take 5 mg by mouth daily. In am.    . lovastatin (MEVACOR) 10 MG tablet Take 10 mg by mouth at bedtime.    . metoprolol succinate (TOPROL-XL) 100 MG 24 hr tablet Take 1 tablet (100 mg total) by mouth daily. Take with or immediately following a meal. (Patient taking differently: Take 100 mg by mouth daily. In am. Take with or immediately following a meal.) 30 tablet 0  . Omega-3  Fatty Acids (FISH OIL PO) Take 1 capsule by mouth daily.    . traMADol (ULTRAM) 50 MG tablet Take 50 mg by mouth 2 (two) times daily as needed for moderate pain or severe pain.     . vitamin B-12 (CYANOCOBALAMIN) 1000 MCG tablet Take 1,000 mcg by mouth daily.     No current facility-administered medications on file prior to visit.     There are no Patient Instructions on file for this visit. No Follow-up on file.   Jocelyne Reinertsen A Remo Kirschenmann, PA-C

## 2017-06-09 ENCOUNTER — Telehealth (INDEPENDENT_AMBULATORY_CARE_PROVIDER_SITE_OTHER): Payer: Self-pay | Admitting: Vascular Surgery

## 2017-06-09 NOTE — Telephone Encounter (Signed)
Calling because patient states that JD put him off until 9/17 but the form was filled out until 9/7 and she wants to just make sure that patient gets the right amount of time off. She will be sending another form incase it should truly say the 17th but if the 7th is right just call and let her know

## 2017-06-14 NOTE — Telephone Encounter (Signed)
He was written out until 9/7.  [name] has already told him this

## 2017-06-14 NOTE — Telephone Encounter (Signed)
Called the patient to let him know that his work note should be written out with a date of 06/03/17, for him to return to work then. I had to leave a message for the patient to call me back.

## 2017-07-27 ENCOUNTER — Encounter: Payer: Self-pay | Admitting: *Deleted

## 2017-07-28 ENCOUNTER — Ambulatory Visit
Admission: RE | Admit: 2017-07-28 | Discharge: 2017-07-28 | Disposition: A | Discharge status: Home / Self Care | Payer: 59 | Source: Ambulatory Visit | Attending: Gastroenterology | Admitting: Gastroenterology

## 2017-07-28 ENCOUNTER — Encounter
Admission: RE | Disposition: A | Discharge status: Home / Self Care | Payer: Self-pay | Source: Ambulatory Visit | Attending: Gastroenterology

## 2017-07-28 DIAGNOSIS — Z1211 Encounter for screening for malignant neoplasm of colon: Secondary | ICD-10-CM | POA: Diagnosis present

## 2017-07-28 DIAGNOSIS — Z7902 Long term (current) use of antithrombotics/antiplatelets: Secondary | ICD-10-CM | POA: Diagnosis not present

## 2017-07-28 DIAGNOSIS — Z7901 Long term (current) use of anticoagulants: Secondary | ICD-10-CM | POA: Insufficient documentation

## 2017-07-28 DIAGNOSIS — Z5309 Procedure and treatment not carried out because of other contraindication: Secondary | ICD-10-CM | POA: Insufficient documentation

## 2017-07-28 HISTORY — DX: Calculus of gallbladder without cholecystitis without obstruction: K80.20

## 2017-07-28 HISTORY — DX: Pleurisy: R09.1

## 2017-07-28 HISTORY — DX: Chronic cholecystitis: K81.1

## 2017-07-28 HISTORY — DX: Hyperlipidemia, unspecified: E78.5

## 2017-07-28 LAB — GLUCOSE, CAPILLARY: GLUCOSE-CAPILLARY: 119 mg/dL — AB (ref 65–99)

## 2017-07-28 SURGERY — COLONOSCOPY WITH PROPOFOL
Anesthesia: General

## 2017-07-28 NOTE — OR Nursing (Signed)
Procedure cancelled. Did not stop taking Plavix. Office to reschedule.

## 2017-07-29 NOTE — H&P (Signed)
Patietn presented for elective endoscopic procedures.  However he did not  Stop his anticoagulants, and case was cancelled.  My office staff will call him to rearrange.

## 2017-08-12 ENCOUNTER — Encounter: Payer: Self-pay | Admitting: *Deleted

## 2017-08-15 ENCOUNTER — Ambulatory Visit: Payer: 59 | Admitting: Anesthesiology

## 2017-08-15 ENCOUNTER — Ambulatory Visit
Admission: RE | Admit: 2017-08-15 | Discharge: 2017-08-15 | Disposition: A | Discharge status: Home / Self Care | Payer: 59 | Source: Ambulatory Visit | Attending: Gastroenterology | Admitting: Gastroenterology

## 2017-08-15 ENCOUNTER — Encounter
Admission: RE | Disposition: A | Discharge status: Home / Self Care | Payer: Self-pay | Source: Ambulatory Visit | Attending: Gastroenterology

## 2017-08-15 DIAGNOSIS — Z87891 Personal history of nicotine dependence: Secondary | ICD-10-CM | POA: Diagnosis not present

## 2017-08-15 DIAGNOSIS — Z882 Allergy status to sulfonamides status: Secondary | ICD-10-CM | POA: Insufficient documentation

## 2017-08-15 DIAGNOSIS — I10 Essential (primary) hypertension: Secondary | ICD-10-CM | POA: Diagnosis not present

## 2017-08-15 DIAGNOSIS — K640 First degree hemorrhoids: Secondary | ICD-10-CM | POA: Diagnosis not present

## 2017-08-15 DIAGNOSIS — Z1211 Encounter for screening for malignant neoplasm of colon: Secondary | ICD-10-CM | POA: Insufficient documentation

## 2017-08-15 DIAGNOSIS — Z7982 Long term (current) use of aspirin: Secondary | ICD-10-CM | POA: Insufficient documentation

## 2017-08-15 DIAGNOSIS — Z7984 Long term (current) use of oral hypoglycemic drugs: Secondary | ICD-10-CM | POA: Diagnosis not present

## 2017-08-15 DIAGNOSIS — K760 Fatty (change of) liver, not elsewhere classified: Secondary | ICD-10-CM | POA: Diagnosis not present

## 2017-08-15 DIAGNOSIS — K219 Gastro-esophageal reflux disease without esophagitis: Secondary | ICD-10-CM | POA: Insufficient documentation

## 2017-08-15 DIAGNOSIS — Z880 Allergy status to penicillin: Secondary | ICD-10-CM | POA: Diagnosis not present

## 2017-08-15 DIAGNOSIS — E1151 Type 2 diabetes mellitus with diabetic peripheral angiopathy without gangrene: Secondary | ICD-10-CM | POA: Insufficient documentation

## 2017-08-15 DIAGNOSIS — E785 Hyperlipidemia, unspecified: Secondary | ICD-10-CM | POA: Insufficient documentation

## 2017-08-15 DIAGNOSIS — Z7902 Long term (current) use of antithrombotics/antiplatelets: Secondary | ICD-10-CM | POA: Insufficient documentation

## 2017-08-15 DIAGNOSIS — J449 Chronic obstructive pulmonary disease, unspecified: Secondary | ICD-10-CM | POA: Diagnosis not present

## 2017-08-15 DIAGNOSIS — I471 Supraventricular tachycardia: Secondary | ICD-10-CM | POA: Diagnosis not present

## 2017-08-15 DIAGNOSIS — Z79899 Other long term (current) drug therapy: Secondary | ICD-10-CM | POA: Diagnosis not present

## 2017-08-15 HISTORY — PX: COLONOSCOPY WITH PROPOFOL: SHX5780

## 2017-08-15 HISTORY — DX: Other seasonal allergic rhinitis: J30.2

## 2017-08-15 LAB — GLUCOSE, CAPILLARY: GLUCOSE-CAPILLARY: 207 mg/dL — AB (ref 65–99)

## 2017-08-15 SURGERY — COLONOSCOPY WITH PROPOFOL
Anesthesia: General

## 2017-08-15 MED ORDER — PHENYLEPHRINE HCL 10 MG/ML IJ SOLN
INTRAMUSCULAR | Status: DC | PRN
  Administered 2017-08-15: 100 ug via INTRAVENOUS

## 2017-08-15 MED ORDER — LIDOCAINE HCL (PF) 2 % IJ SOLN
INTRAMUSCULAR | Status: AC
  Filled 2017-08-15: qty 10

## 2017-08-15 MED ORDER — PROPOFOL 500 MG/50ML IV EMUL
INTRAVENOUS | Status: AC
  Filled 2017-08-15: qty 50

## 2017-08-15 MED ORDER — LIDOCAINE HCL (PF) 1 % IJ SOLN
2.0000 mL | Freq: Once | INTRAMUSCULAR | Status: AC
  Administered 2017-08-15: 0.3 mL via INTRADERMAL

## 2017-08-15 MED ORDER — LIDOCAINE HCL (CARDIAC) 20 MG/ML IV SOLN
INTRAVENOUS | Status: DC | PRN
  Administered 2017-08-15: 40 mg via INTRAVENOUS

## 2017-08-15 MED ORDER — PROPOFOL 500 MG/50ML IV EMUL
INTRAVENOUS | Status: DC | PRN
  Administered 2017-08-15: 120 ug/kg/min via INTRAVENOUS

## 2017-08-15 MED ORDER — LIDOCAINE HCL (PF) 1 % IJ SOLN
INTRAMUSCULAR | Status: AC
  Administered 2017-08-15: 0.3 mL via INTRADERMAL
  Filled 2017-08-15: qty 2

## 2017-08-15 MED ORDER — SODIUM CHLORIDE 0.9 % IV SOLN
INTRAVENOUS | Status: DC
  Administered 2017-08-15: 1000 mL via INTRAVENOUS

## 2017-08-15 MED ORDER — SODIUM CHLORIDE 0.9 % IV SOLN: INTRAVENOUS | Status: DC

## 2017-08-15 MED ORDER — PROPOFOL 10 MG/ML IV BOLUS
INTRAVENOUS | Status: DC | PRN
  Administered 2017-08-15: 50 mg via INTRAVENOUS
  Administered 2017-08-15: 20 mg via INTRAVENOUS
  Administered 2017-08-15: 50 mg via INTRAVENOUS

## 2017-08-15 NOTE — Anesthesia Preprocedure Evaluation (Signed)
Anesthesia Evaluation  Patient identified by MRN, date of birth, ID band  Reviewed: Allergy & Precautions, NPO status , Patient's Chart, lab work & pertinent test results  Airway Mallampati: II       Dental  (+) Missing   Pulmonary COPD, former smoker,     + decreased breath sounds      Cardiovascular hypertension, Pt. on medications and Pt. on home beta blockers + Peripheral Vascular Disease  + dysrhythmias Supra Ventricular Tachycardia  Rhythm:Regular Rate:Normal     Neuro/Psych negative neurological ROS  negative psych ROS   GI/Hepatic Neg liver ROS, GERD  Medicated,  Endo/Other  diabetes, Type 2, Oral Hypoglycemic Agents  Renal/GU negative Renal ROS     Musculoskeletal   Abdominal Normal abdominal exam  (+)   Peds negative pediatric ROS (+)  Hematology negative hematology ROS (+)   Anesthesia Other Findings   Reproductive/Obstetrics                             Anesthesia Physical Anesthesia Plan  ASA: III  Anesthesia Plan: General   Post-op Pain Management:    Induction: Intravenous  PONV Risk Score and Plan: 0  Airway Management Planned: Natural Airway and Nasal Cannula  Additional Equipment:   Intra-op Plan:   Post-operative Plan:   Informed Consent: I have reviewed the patients History and Physical, chart, labs and discussed the procedure including the risks, benefits and alternatives for the proposed anesthesia with the patient or authorized representative who has indicated his/her understanding and acceptance.     Plan Discussed with: CRNA  Anesthesia Plan Comments:         Anesthesia Quick Evaluation

## 2017-08-15 NOTE — Op Note (Signed)
[facility] Patient Name: [name] Procedure Date: 08/15/2017 1:02 PM MRN: [id number] Account #: [id number] Date of Birth: 1951/02/04 Admit Type: Outpatient Age: 66 Room: [facility] ENDO ROOM 1 Gender: Male Note Status: Finalized Procedure:            Colonoscopy Indications:          Screening for colorectal malignant neoplasm Providers:            Lollie Sails, MD Referring MD:         [name], MD (Referring MD) Medicines:            Monitored Anesthesia Care Complications:        No immediate complications. Procedure:            Pre-Anesthesia Assessment:                       - ASA Grade Assessment: III - A patient with severe                        systemic disease.                       After obtaining informed consent, the colonoscope was                        passed under direct vision. Throughout the procedure,                        the patient's blood pressure, pulse, and oxygen                        saturations were monitored continuously. The                        Colonoscope was introduced through the anus and                        advanced to the the cecum, identified by appendiceal                        orifice and ileocecal valve. The colonoscopy was                        performed without difficulty. The patient tolerated the                        procedure well. The quality of the bowel preparation                        was fair. Findings:      Non-bleeding internal hemorrhoids were found during anoscopy. The       hemorrhoids were small and Grade I (internal hemorrhoids that do not       prolapse).      The exam was otherwise normal throughout the examined colon.      The digital rectal exam was normal. Impression:           - Preparation of the colon was fair.                       - Non-bleeding internal hemorrhoids.                       -  No specimens collected. Recommendation:       - Discharge  patient to home.                       - Repeat colonoscopy in 10 years for screening purposes. Procedure Code(s):    --- Professional ---                       [phone], Colonoscopy, flexible; diagnostic, including                        collection of specimen(s) by brushing or washing, when                        performed (separate procedure) Diagnosis Code(s):    --- Professional ---                       Z12.11, Encounter for screening for malignant neoplasm                        of colon                       K64.0, First degree hemorrhoids CPT copyright 2016 American Medical Association. All rights reserved. The codes documented in this report are preliminary and upon coder review may  be revised to meet current compliance requirements. Lollie Sails, MD 08/15/2017 1:32:17 PM This report has been signed electronically. Number of Addenda: 0 Note Initiated On: 08/15/2017 1:02 PM Scope Withdrawal Time: 0 hours 6 minutes 50 seconds  Total Procedure Duration: 0 hours 23 minutes 6 seconds       [facility]

## 2017-08-15 NOTE — Transfer of Care (Signed)
Immediate Anesthesia Transfer of Care Note  Patient: [name]  Procedure(s) Performed: COLONOSCOPY WITH PROPOFOL (N/A )  Patient Location: PACU and Endoscopy Unit  Anesthesia Type:General  Level of Consciousness: awake  Airway & Oxygen Therapy: Patient Spontanous Breathing  Post-op Assessment: Report given to RN  Post vital signs: stable  Last Vitals:  Vitals:   08/15/17 1233  BP: 113/72  Pulse: 77  Resp: 17  Temp: (!) 35.8 C  SpO2: 99%    Last Pain:  Vitals:   08/15/17 1233  TempSrc: Tympanic         Complications: No apparent anesthesia complications

## 2017-08-15 NOTE — H&P (Signed)
Outpatient short stay form Pre-procedure 08/15/2017 1:00 PM Lollie Sails MD  Primary Physician: Dr Clayborn Bigness  Reason for visit:  Colonoscopy  History of present illness:  Patient is a 66 year old male presenting today as above. This is his first colonoscopy. He tolerated his prep well. He does take both aspirin and Plavix but is held both of those for 6 days. He takes no other aspirin product or other blood thinning agent.   Current Facility-Administered Medications:  .  0.9 %  sodium chloride infusion, , Intravenous, Continuous, Lollie Sails, MD, Last Rate: 20 mL/hr at 08/15/17 1248, 1,000 mL at 08/15/17 1248 .  0.9 %  sodium chloride infusion, , Intravenous, Continuous, Lollie Sails, MD  Medications Prior to Admission  Medication Sig Dispense Refill Last Dose  . polyethylene glycol-electrolytes (NULYTELY/GOLYTELY) 420 g solution Take 4,000 mLs once by mouth.     Marland Kitchen aspirin EC 81 MG tablet Take 81 mg by mouth daily.   08/10/2017  . budesonide-formoterol (SYMBICORT) 160-4.5 MCG/ACT inhaler Inhale 2 puffs into the lungs 2 (two) times daily.   Taking  . Calcium Carbonate-Vitamin D (CALCIUM PLUS VITAMIN D PO) Take 1 tablet by mouth daily.   Taking  . clopidogrel (PLAVIX) 75 MG tablet Take 1 tablet (75 mg total) by mouth daily with breakfast. 30 tablet 5 08/10/2017  . diphenhydrAMINE (BENADRYL) 25 mg capsule Take 25 mg by mouth every 6 (six) hours as needed for itching or allergies.    Taking  . famotidine (PEPCID) 20 MG tablet Take 20 mg by mouth daily as needed for heartburn or indigestion.   Taking  . gabapentin (NEURONTIN) 100 MG capsule Take 200 mg by mouth 3 (three) times daily.    Taking  . glyBURIDE (DIABETA) 2.5 MG tablet Take 2.5 mg by mouth 2 (two) times daily with a meal.   Taking  . HYDROcodone-acetaminophen (NORCO) 5-325 MG tablet Take 1-2 tablets by mouth every 6 (six) hours as needed for moderate pain. 30 tablet 0 Taking  . ipratropium (ATROVENT HFA) 17  MCG/ACT inhaler Inhale 2 puffs into the lungs every 4 (four) hours as needed for wheezing.   Taking  . lisinopril (PRINIVIL,ZESTRIL) 5 MG tablet Take 5 mg by mouth daily. In am.   08/15/2017 at 0700  . lovastatin (MEVACOR) 10 MG tablet Take 10 mg by mouth at bedtime.   Taking  . metoprolol succinate (TOPROL-XL) 100 MG 24 hr tablet Take 1 tablet (100 mg total) by mouth daily. Take with or immediately following a meal. (Patient taking differently: Take 100 mg by mouth daily. In am. Take with or immediately following a meal.) 30 tablet 0 08/15/2017 at 0700  . mometasone-formoterol (DULERA) 100-5 MCG/ACT AERO Inhale 2 puffs into the lungs 2 (two) times daily.     . Omega-3 Fatty Acids (FISH OIL PO) Take 1 capsule by mouth daily.   08/04/2017  . traMADol (ULTRAM) 50 MG tablet Take 50 mg by mouth 2 (two) times daily as needed for moderate pain or severe pain.    Taking  . vitamin B-12 (CYANOCOBALAMIN) 1000 MCG tablet Take 1,000 mcg by mouth daily.   Taking     Allergies  Allergen Reactions  . Ampicillin   . Bactrim [Sulfamethoxazole-Trimethoprim] Other (See Comments)    Mouth sores Sores develop in mouth  . Amoxicillin Rash    Sores in my mouth  . Penicillins Rash    Rash in and around the mouth Has patient had a PCN reaction causing immediate  rash, facial/tongue/throat swelling, SOB or lightheadedness with hypotension: Yes Has patient had a PCN reaction causing severe rash involving mucus membranes or skin necrosis: Yes Has patient had a PCN reaction that required hospitalization: No Has patient had a PCN reaction occurring within the last 10 years: Yes If all of the above answers are "NO", then may proceed with Cephalosporin use.      Past Medical History:  Diagnosis Date  . Cholecystitis, chronic   . Cholelithiasis   . COPD (chronic obstructive pulmonary disease) ([place])   . Diabetes mellitus without complication ([place])   . Dyspnea   . Fatty liver 2008  . Fracture of clavicle 1992    left   . Fracture of ribs, multiple 1992   3 ribs  . GERD (gastroesophageal reflux disease)   . Hyperlipidemia   . Hypertension   . Paroxysmal SVT (supraventricular tachycardia) ([place])   . Pelvis fracture ([place]) 1992  . Pleurisy   . Pleurisy   . Seasonal allergies     Review of systems:      Physical Exam    Heart and lungs: Regular rate and rhythm without rub or gallop, lungs are bilaterally clear.    HEENT: Normocephalic atraumatic eyes are anicteric    Other:     Pertinant exam for procedure: Soft nontender nondistended bowel sounds positive normoactive.    Planned proceedures: Colonoscopy and indicated procedures. I have discussed the risks benefits and complications of procedures to include not limited to bleeding, infection, perforation and the risk of sedation and the patient wishes to proceed.    Lollie Sails, MD Gastroenterology 08/15/2017  1:00 PM

## 2017-08-15 NOTE — Anesthesia Post-op Follow-up Note (Signed)
Anesthesia QCDR form completed.        

## 2017-08-16 ENCOUNTER — Encounter: Payer: Self-pay | Admitting: Gastroenterology

## 2017-08-16 NOTE — Anesthesia Postprocedure Evaluation (Signed)
Anesthesia Post Note  Patient: [name]  Procedure(s) Performed: COLONOSCOPY WITH PROPOFOL (N/A )  Patient location during evaluation: PACU Anesthesia Type: General Level of consciousness: awake Pain management: pain level controlled Vital Signs Assessment: post-procedure vital signs reviewed and stable Respiratory status: nonlabored ventilation Cardiovascular status: stable Anesthetic complications: no     Last Vitals:  Vitals:   08/15/17 1353 08/15/17 1403  BP: 90/67 121/76  Pulse: 74 75  Resp: 14 20  Temp:    SpO2: 100% 97%    Last Pain:  Vitals:   08/15/17 1333  TempSrc: Axillary                 VAN STAVEREN,Jahlia Omura

## 2017-09-12 ENCOUNTER — Other Ambulatory Visit: Payer: Self-pay | Admitting: Internal Medicine

## 2017-09-13 ENCOUNTER — Ambulatory Visit (INDEPENDENT_AMBULATORY_CARE_PROVIDER_SITE_OTHER): Payer: 59 | Admitting: Vascular Surgery

## 2017-09-13 ENCOUNTER — Encounter (INDEPENDENT_AMBULATORY_CARE_PROVIDER_SITE_OTHER): Payer: Self-pay | Admitting: Vascular Surgery

## 2017-09-13 ENCOUNTER — Ambulatory Visit (INDEPENDENT_AMBULATORY_CARE_PROVIDER_SITE_OTHER): Payer: 59

## 2017-09-13 VITALS — BP 135/68 | HR 73 | Resp 17 | Wt 176.0 lb

## 2017-09-13 DIAGNOSIS — I739 Peripheral vascular disease, unspecified: Secondary | ICD-10-CM

## 2017-09-13 DIAGNOSIS — E118 Type 2 diabetes mellitus with unspecified complications: Secondary | ICD-10-CM

## 2017-09-13 DIAGNOSIS — I6523 Occlusion and stenosis of bilateral carotid arteries: Secondary | ICD-10-CM | POA: Diagnosis not present

## 2017-09-13 DIAGNOSIS — E785 Hyperlipidemia, unspecified: Secondary | ICD-10-CM | POA: Diagnosis not present

## 2017-09-13 NOTE — Assessment & Plan Note (Signed)
His left carotid artery stenosis would appear to be stable in the 40-59% range. He will continue aspirin, Plavix, and his statin agent.  Plan to recheck in 6 months with duplex

## 2017-09-13 NOTE — Assessment & Plan Note (Signed)
blood glucose control important in reducing the progression of atherosclerotic disease. Also, involved in wound healing. On appropriate medications.  

## 2017-09-13 NOTE — Assessment & Plan Note (Signed)
He does not describe typical claudication symptoms.  His ABIs today are 1.26 on the right and 0.71 on the left which is stable.  He appears to have a left SFA stenosis or occlusion as well as tibial disease on the left on duplex.  At this point, his perfusion is reasonably stable although reduced on the left.  No symptoms to warrant intervention.  Recheck in 6 months

## 2017-09-13 NOTE — Assessment & Plan Note (Signed)
lipid control important in reducing the progression of atherosclerotic disease. Continue statin therapy  

## 2017-09-13 NOTE — Patient Instructions (Signed)

## 2017-09-13 NOTE — Progress Notes (Signed)
MRN : [id number]  [name] is a 66 y.o. (1951-09-04) male who presents with chief complaint of  Chief Complaint  Patient presents with  . Follow-up    bil art and abi ultrasound  .  History of Present Illness: Patient returns today in follow up of multiple vascular issues.  He is several months status post right carotid endarterectomy and seems to be doing well from this.  His duplex today shows mildly elevated velocities in the right carotid endarterectomy site but this is a marked improvement from his preoperative levels.  His left carotid artery stenosis would appear to be stable in the 40-59% range. He is also studied today for his peripheral arterial disease.  He has some neuropathic symptoms of both lower extremities.  He does not describe typical claudication symptoms.  His ABIs today are 1.26 on the right and 0.71 on the left which is stable.  He appears to have a left SFA stenosis or occlusion as well as tibial disease on the left on duplex.  Current Outpatient Medications  Medication Sig Dispense Refill  . aspirin EC 81 MG tablet Take 81 mg by mouth daily.    . budesonide-formoterol (SYMBICORT) 160-4.5 MCG/ACT inhaler Inhale 2 puffs into the lungs 2 (two) times daily.    . Calcium Carbonate-Vitamin D (CALCIUM PLUS VITAMIN D PO) Take 1 tablet by mouth daily.    . clopidogrel (PLAVIX) 75 MG tablet Take 1 tablet (75 mg total) by mouth daily with breakfast. 30 tablet 5  . diphenhydrAMINE (BENADRYL) 25 mg capsule Take 25 mg by mouth every 6 (six) hours as needed for itching or allergies.     . famotidine (PEPCID) 20 MG tablet Take 20 mg by mouth daily as needed for heartburn or indigestion.    . gabapentin (NEURONTIN) 100 MG capsule Take 200 mg by mouth 3 (three) times daily.     Marland Kitchen glyBURIDE (DIABETA) 2.5 MG tablet TAKE 1 TABLET BY MOUTH TWICE DAILY WITH FOOD 180 tablet 2  . ipratropium (ATROVENT HFA) 17 MCG/ACT inhaler Inhale 2 puffs into the lungs every 4 (four) hours as needed  for wheezing.    Marland Kitchen lisinopril (PRINIVIL,ZESTRIL) 5 MG tablet Take 5 mg by mouth daily. In am.    . lovastatin (MEVACOR) 10 MG tablet Take 10 mg by mouth at bedtime.    . metoprolol succinate (TOPROL-XL) 100 MG 24 hr tablet Take 1 tablet (100 mg total) by mouth daily. Take with or immediately following a meal. (Patient taking differently: Take 100 mg by mouth daily. In am. Take with or immediately following a meal.) 30 tablet 0  . mometasone-formoterol (DULERA) 100-5 MCG/ACT AERO Inhale 2 puffs into the lungs 2 (two) times daily.    . Omega-3 Fatty Acids (FISH OIL PO) Take 1 capsule by mouth daily.    . polyethylene glycol-electrolytes (NULYTELY/GOLYTELY) 420 g solution Take 4,000 mLs once by mouth.    . vitamin B-12 (CYANOCOBALAMIN) 1000 MCG tablet Take 1,000 mcg by mouth daily.    Marland Kitchen HYDROcodone-acetaminophen (NORCO) 5-325 MG tablet Take 1-2 tablets by mouth every 6 (six) hours as needed for moderate pain. (Patient not taking: Reported on 09/13/2017) 30 tablet 0  . traMADol (ULTRAM) 50 MG tablet Take 50 mg by mouth 2 (two) times daily as needed for moderate pain or severe pain.      No current facility-administered medications for this visit.     Past Medical History:  Diagnosis Date  . Cholecystitis, chronic   .  Cholelithiasis   . COPD (chronic obstructive pulmonary disease) ([place])   . Diabetes mellitus without complication ([place])   . Dyspnea   . Fatty liver 2008  . Fracture of clavicle 1992   left   . Fracture of ribs, multiple 1992   3 ribs  . GERD (gastroesophageal reflux disease)   . Hyperlipidemia   . Hypertension   . Paroxysmal SVT (supraventricular tachycardia) ([place])   . Pelvis fracture ([place]) 1992  . Pleurisy   . Pleurisy   . Seasonal allergies     Past Surgical History:  Procedure Laterality Date  . AMPUTATION TOE Left 02/25/2017   Procedure: AMPUTATION TOE-LEFT 2ND MPJ;  Surgeon: Samara Deist, DPM;  Location: [facility] ORS;  Service: Podiatry;  Laterality: Left;  . BACK  SURGERY  2008  . CHOLECYSTECTOMY    . COLONOSCOPY WITH PROPOFOL N/A 08/15/2017   Procedure: COLONOSCOPY WITH PROPOFOL;  Surgeon: Lollie Sails, MD;  [facility];  Service: Endoscopy;  Laterality: N/A;  . ENDARTERECTOMY Right 05/12/2017   Procedure: ENDARTERECTOMY CAROTID;  Surgeon: [name], MD;  Location: [facility] ORS;  Service: Vascular;  Laterality: Right;  . ERCP    . FOOT SURGERY Right    x 3    Social History Social History   Tobacco Use  . Smoking status: Former Smoker    Last attempt to quit: 02/24/1997    Years since quitting: 20.5  . Smokeless tobacco: Never Used  Substance Use Topics  . Alcohol use: No  . Drug use: No     Family History Family History  Problem Relation Age of Onset  . Diabetes Sister      Allergies  Allergen Reactions  . Ampicillin   . Bactrim [Sulfamethoxazole-Trimethoprim] Other (See Comments)    Mouth sores Sores develop in mouth  . Amoxicillin Rash    Sores in my mouth  . Penicillins Rash    Rash in and around the mouth Has patient had a PCN reaction causing immediate rash, facial/tongue/throat swelling, SOB or lightheadedness with hypotension: Yes Has patient had a PCN reaction causing severe rash involving mucus membranes or skin necrosis: Yes Has patient had a PCN reaction that required hospitalization: No Has patient had a PCN reaction occurring within the last 10 years: Yes If all of the above answers are "NO", then may proceed with Cephalosporin use.       REVIEW OF SYSTEMS (Negative unless checked)  Constitutional: [] Weight loss  [] Fever  [] Chills Cardiac: [] Chest pain   [] Chest pressure   [x] Palpitations   [] Shortness of breath when laying flat   [] Shortness of breath at rest   [] Shortness of breath with exertion. Vascular:  [] Pain in legs with walking   [] Pain in legs at rest   [] Pain in legs when laying flat   [] Claudication   [] Pain in feet when walking  [] Pain in feet at rest  [] Pain in feet when  laying flat   [] History of DVT   [] Phlebitis   [] Swelling in legs   [] Varicose veins   [x] Non-healing ulcers Pulmonary:   [] Uses home oxygen   [] Productive cough   [] Hemoptysis   [] Wheeze  [] COPD   [] Asthma Neurologic:  [] Dizziness  [] Blackouts   [] Seizures   [] History of stroke   [] History of TIA  [] Aphasia   [] Temporary blindness   [] Dysphagia   [] Weakness or numbness in arms   [x] Weakness or numbness in legs Musculoskeletal:  [x] Arthritis   [] Joint swelling   [] Joint pain   [] Low back  pain Hematologic:  [] Easy bruising  [] Easy bleeding   [] Hypercoagulable state   [] Anemic  [] Hepatitis Gastrointestinal:  [] Blood in stool   [] Vomiting blood  [] Gastroesophageal reflux/heartburn   [] Difficulty swallowing. Genitourinary:  [] Chronic kidney disease   [] Difficult urination  [] Frequent urination  [] Burning with urination   [] Blood in urine Skin:  [] Rashes   [x] Ulcers   [x] Wounds Psychological:  [] History of anxiety   []  History of major depression.     Physical Examination  BP 135/68 (BP Location: Right Arm)   Pulse 73   Resp 17   Wt 79.8 kg (176 lb)   BMI 24.55 kg/m  Gen:  WD/WN, NAD Head: [place]/AT, No temporalis wasting. Ear/Nose/Throat: Hearing grossly intact, nares w/o erythema or drainage, trachea midline Eyes: Conjunctiva clear. Sclera non-icteric Neck: Supple.  No JVD.  Left carotid bruit Pulmonary:  Good air movement, no use of accessory muscles.  Cardiac: RRR, normal S1, S2 Vascular:  Vessel Right Left  Radial Palpable Palpable                          PT Palpable 1+ Palpable  DP Palpable 1+ Palpable    Musculoskeletal: M/S 5/5 throughout.  No deformity or atrophy. Neurologic: Sensation grossly intact in extremities.  Symmetrical.  Speech is fluent.  Psychiatric: Judgment intact, Mood & affect appropriate for pt's clinical situation. Dermatologic: No rashes or ulcers noted.  No cellulitis or open wounds.       Labs Recent Results (from the past 2160 hour(s))    Glucose, capillary     Status: Abnormal   Collection Time: 07/28/17  9:38 AM  Result Value Ref Range   Glucose-Capillary 119 (H) 65 - 99 mg/dL  Glucose, capillary     Status: Abnormal   Collection Time: 08/15/17 12:47 PM  Result Value Ref Range   Glucose-Capillary 207 (H) 65 - 99 mg/dL  VAS US CAROTID     Status: None (In process)   Collection Time: 09/13/17  2:48 PM  Result Value Ref Range   Right CCA prox sys 104 cm/s   Right CCA prox dias 21 cm/s   Right cca dist sys -136 cm/s   Left CCA prox sys 181 cm/s   Left CCA prox dias 31 cm/s   Left CCA dist sys 199 cm/s   Left CCA dist dias 38 cm/s   Left ICA prox sys -131 cm/s   Left ICA prox dias -26 cm/s   Left ICA dist sys -88 cm/s   Left ICA dist dias -24 cm/s   RIGHT CCA MID DIAS 23.00 cm/s   RIGHT ECA DIAS -22.00 cm/s   RIGHT VERTEBRAL DIAS 11.00 cm/s   LEFT ECA DIAS -47.00 cm/s   LEFT VERTEBRAL DIAS 39.00 cm/s  VAS [place] LOWER EXTREMITY ARTERIAL DUPLEX     Status: None (In process)   Collection Time: 09/13/17  2:49 PM  Result Value Ref Range   Left super femoral prox sys PSV 17 cm/s   Left super femoral mid sys PSV 18 cm/s   Left super femoral dist sys PSV -27 cm/s   Left popliteal prox sys PSV 70 cm/s   Left popliteal dist sys PSV -69 cm/s   Left peroneal sys PSV -30 cm/s   Left ant tibial distal sys 75 cm/s   LEFT PERO DIST SYS 29.00 cm/s    Radiology No results found.    Assessment/Plan  Hyperlipidemia lipid control important in reducing the progression of atherosclerotic disease.  Continue statin therapy   Type 2 diabetes mellitus with complication (HCC) blood glucose control important in reducing the progression of atherosclerotic disease. Also, involved in wound healing. On appropriate medications.   PAD (peripheral artery disease) ([place]) He does not describe typical claudication symptoms.  His ABIs today are 1.26 on the right and 0.71 on the left which is stable.  He appears to have a left SFA stenosis  or occlusion as well as tibial disease on the left on duplex.  At this point, his perfusion is reasonably stable although reduced on the left.  No symptoms to warrant intervention.  Recheck in 6 months  Bilateral carotid artery stenosis His left carotid artery stenosis would appear to be stable in the 40-59% range. He will continue aspirin, Plavix, and his statin agent.  Plan to recheck in 6 months with duplex    Leotis Pain, MD  09/13/2017 5:16 PM    This note was created with Dragon medical transcription system.  Any errors from dictation are purely unintentional

## 2017-10-17 ENCOUNTER — Other Ambulatory Visit: Payer: Self-pay | Admitting: Internal Medicine

## 2017-10-28 ENCOUNTER — Other Ambulatory Visit: Payer: Self-pay | Admitting: Internal Medicine

## 2017-10-28 ENCOUNTER — Other Ambulatory Visit: Payer: Self-pay

## 2017-11-03 ENCOUNTER — Ambulatory Visit: Payer: 59 | Admitting: Nurse Practitioner

## 2017-11-03 ENCOUNTER — Encounter: Payer: Self-pay | Admitting: Nurse Practitioner

## 2017-11-03 VITALS — BP 122/63 | HR 59 | Resp 16 | Ht 70.0 in | Wt 178.6 lb

## 2017-11-03 DIAGNOSIS — I6523 Occlusion and stenosis of bilateral carotid arteries: Secondary | ICD-10-CM | POA: Diagnosis not present

## 2017-11-03 DIAGNOSIS — M19011 Primary osteoarthritis, right shoulder: Secondary | ICD-10-CM

## 2017-11-03 DIAGNOSIS — E1165 Type 2 diabetes mellitus with hyperglycemia: Secondary | ICD-10-CM

## 2017-11-03 DIAGNOSIS — K219 Gastro-esophageal reflux disease without esophagitis: Secondary | ICD-10-CM | POA: Diagnosis not present

## 2017-11-03 DIAGNOSIS — I10 Essential (primary) hypertension: Secondary | ICD-10-CM

## 2017-11-03 DIAGNOSIS — M19012 Primary osteoarthritis, left shoulder: Secondary | ICD-10-CM | POA: Diagnosis not present

## 2017-11-03 LAB — POCT GLYCOSYLATED HEMOGLOBIN (HGB A1C): Hemoglobin A1C: 7.2

## 2017-11-03 MED ORDER — TRAMADOL HCL 50 MG PO TABS: 50.0000 mg | ORAL_TABLET | Freq: Two times a day (BID) | ORAL | 3 refills | Status: DC | PRN

## 2017-11-03 NOTE — Progress Notes (Addendum)
[facility] [place], [place] [address]  Internal MEDICINE  Office Visit Note  Patient Name: [name]  [id number]  [id number]  Date of Service: 11/03/2017  Chief Complaint  Patient presents with  . Diabetes    well controlled blood sugars.  . Osteoarthritis    Diabetes  He presents for his follow-up diabetic visit. He has type 2 diabetes mellitus. No MedicAlert identification noted. His disease course has been stable. There are no hypoglycemic associated symptoms. Pertinent negatives for hypoglycemia include no headaches, nervousness/anxiousness, pallor or tremors. There are no diabetic associated symptoms. Pertinent negatives for diabetes include no chest pain and no fatigue. Symptoms are stable. There are no diabetic complications. Risk factors for coronary artery disease include diabetes mellitus, dyslipidemia and hypertension. Current diabetic treatment includes oral agent (monotherapy). He is compliant with treatment all of the time. His weight is stable. He is following a generally healthy diet. He participates in exercise intermittently. There is no change in his home blood glucose trend. An ACE inhibitor/angiotensin II receptor blocker is being taken. He does not see a podiatrist.Eye exam is not current.    Pt is here for routine follow up.    Current Medication: Outpatient Encounter Medications as of 11/03/2017  Medication Sig  . aspirin EC 81 MG tablet Take 81 mg by mouth daily.  . budesonide-formoterol (SYMBICORT) 160-4.5 MCG/ACT inhaler Inhale 2 puffs into the lungs 2 (two) times daily.  . Calcium Carbonate-Vitamin D (CALCIUM PLUS VITAMIN D PO) Take 1 tablet by mouth daily.  . clopidogrel (PLAVIX) 75 MG tablet Take 1 tablet (75 mg total) by mouth daily with breakfast.  . diphenhydrAMINE (BENADRYL) 25 mg capsule Take 25 mg by mouth every 6 (six) hours as needed for itching or allergies.   . famotidine (PEPCID) 20 MG tablet Take 20 mg by mouth daily  as needed for heartburn or indigestion.  . gabapentin (NEURONTIN) 100 MG capsule TAKE 2 CAPSULES BY MOUTH UP TO THREE TIMES DAILY  . glyBURIDE (DIABETA) 2.5 MG tablet TAKE 1 TABLET BY MOUTH TWICE DAILY WITH FOOD  . HYDROcodone-acetaminophen (NORCO) 5-325 MG tablet Take 1-2 tablets by mouth every 6 (six) hours as needed for moderate pain.  Marland Kitchen ipratropium (ATROVENT HFA) 17 MCG/ACT inhaler Inhale 2 puffs into the lungs every 4 (four) hours as needed for wheezing.  Marland Kitchen lisinopril (PRINIVIL,ZESTRIL) 5 MG tablet TAKE 1 TABLET BY MOUTH ONCE DAILY FOR BLOOD PRESSURE  . lovastatin (MEVACOR) 10 MG tablet TAKE 1 TABLET BY MOUTH AT BEDTIME FOR CHOLESTEROL  . metoprolol succinate (TOPROL-XL) 100 MG 24 hr tablet Take 1 tablet (100 mg total) by mouth daily. Take with or immediately following a meal. (Patient taking differently: Take 100 mg by mouth daily. In am. Take with or immediately following a meal.)  . mometasone-formoterol (DULERA) 100-5 MCG/ACT AERO Inhale 2 puffs into the lungs 2 (two) times daily.  . Omega-3 Fatty Acids (FISH OIL PO) Take 1 capsule by mouth daily.  . polyethylene glycol-electrolytes (NULYTELY/GOLYTELY) 420 g solution Take 4,000 mLs once by mouth.  . traMADol (ULTRAM) 50 MG tablet Take 1 tablet (50 mg total) by mouth 2 (two) times daily as needed for moderate pain or severe pain.  . vitamin B-12 (CYANOCOBALAMIN) 1000 MCG tablet Take 1,000 mcg by mouth daily.  . [DISCONTINUED] traMADol (ULTRAM) 50 MG tablet Take 50 mg by mouth 2 (two) times daily as needed for moderate pain or severe pain.    No facility-administered encounter medications on file as  of 11/03/2017.     Surgical History: Past Surgical History:  Procedure Laterality Date  . AMPUTATION TOE Left 02/25/2017   Procedure: AMPUTATION TOE-LEFT 2ND MPJ;  Surgeon: Samara Deist, DPM;  Location: [facility] ORS;  Service: Podiatry;  Laterality: Left;  . BACK SURGERY  2008  . CHOLECYSTECTOMY    . COLONOSCOPY WITH PROPOFOL N/A 08/15/2017    Procedure: COLONOSCOPY WITH PROPOFOL;  Surgeon: Lollie Sails, MD;  Location: [facility] ENDOSCOPY;  Service: Endoscopy;  Laterality: N/A;  . ENDARTERECTOMY Right 05/12/2017   Procedure: ENDARTERECTOMY CAROTID;  Surgeon: [name], MD;  Location: [facility] ORS;  Service: Vascular;  Laterality: Right;  . ERCP    . FOOT SURGERY Right    x 3    Medical History: Past Medical History:  Diagnosis Date  . Cholecystitis, chronic   . Cholelithiasis   . COPD (chronic obstructive pulmonary disease) ([place])   . Diabetes mellitus without complication ([place])   . Dyspnea   . Fatty liver 2008  . Fracture of clavicle 1992   left   . Fracture of ribs, multiple 1992   3 ribs  . GERD (gastroesophageal reflux disease)   . Hyperlipidemia   . Hypertension   . Paroxysmal SVT (supraventricular tachycardia) ([place])   . Pelvis fracture ([place]) 1992  . Pleurisy   . Pleurisy   . Seasonal allergies     Family History: Family History  Problem Relation Age of Onset  . Diabetes Sister     Social History   Socioeconomic History  . Marital status: Single    Spouse name: Not on file  . Number of children: Not on file  . Years of education: Not on file  . Highest education level: Not on file  Social Needs  . Financial resource strain: Not on file  . Food insecurity - worry: Not on file  . Food insecurity - inability: Not on file  . Transportation needs - medical: Not on file  . Transportation needs - non-medical: Not on file  Occupational History  . Not on file  Tobacco Use  . Smoking status: Former Smoker    Last attempt to quit: 02/24/1997    Years since quitting: 20.7  . Smokeless tobacco: Never Used  Substance and Sexual Activity  . Alcohol use: No  . Drug use: No  . Sexual activity: Not on file  Other Topics Concern  . Not on file  Social History Narrative  . Not on file      Review of Systems  Constitutional: Negative for activity change, chills, fatigue and unexpected weight change.   HENT: Positive for postnasal drip. Negative for congestion, rhinorrhea, sneezing and sore throat.   Eyes: Negative.  Negative for redness.  Respiratory: Negative for cough, chest tightness, shortness of breath and wheezing.   Cardiovascular: Negative for chest pain and palpitations.  Gastrointestinal: Negative for abdominal pain, constipation, diarrhea, nausea and vomiting.  Endocrine:       Blood sugars doing well, overall.   Genitourinary: Negative for dysuria and frequency.  Musculoskeletal: Positive for arthralgias. Negative for back pain, joint swelling and neck pain.       Chronic, bilateral shoulder pain. Intermittent and made worse with exertion.   Skin: Negative for color change, pallor, rash and wound.  Allergic/Immunologic: Positive for environmental allergies.  Neurological: Negative for tremors, numbness and headaches.  Hematological: Negative for adenopathy. Does not bruise/bleed easily.  Psychiatric/Behavioral: Negative for behavioral problems (Depression), sleep disturbance and suicidal ideas. The patient is not nervous/anxious.  Today's Vitals   11/03/17 0920  BP: 122/63  Pulse: (!) 59  Resp: 16  SpO2: 96%  Weight: 178 lb 9.6 oz (81 kg)  Height: 5\' 10"  (1.778 m)    Physical Exam  Constitutional: He is oriented to person, place, and time. He appears well-developed and well-nourished. No distress.  HENT:  Head: Normocephalic and atraumatic.  Mouth/Throat: Oropharynx is clear and moist. No oropharyngeal exudate.  Eyes: EOM are normal. Pupils are equal, round, and reactive to light.  Neck: Normal range of motion. Neck supple. No JVD present. Carotid bruit is not present. No tracheal deviation present. No thyromegaly present.  Surgical scar from right endarctectomy has healed well.   Cardiovascular: Normal rate, regular rhythm and normal heart sounds. Exam reveals no gallop and no friction rub.  No murmur heard. Pulmonary/Chest: Effort normal. No respiratory  distress. He has no wheezes. He has no rales. He exhibits no tenderness.  Abdominal: Soft. Bowel sounds are normal. There is no tenderness.  Musculoskeletal:  Intermittent, bilateral shoulder pain, resulting in reduced ROM and strength in the shoulders. No bony abnormalities or deformities are currently noted.   Lymphadenopathy:    He has no cervical adenopathy.  Neurological: He is alert and oriented to person, place, and time. No cranial nerve deficit.  Skin: Skin is warm and dry. He is not diaphoretic.  Psychiatric: He has a normal mood and affect. His behavior is normal. Judgment and thought content normal.  Nursing note and vitals reviewed.  Assessment/Plan:  1. Uncontrolled type 2 diabetes mellitus with hyperglycemia (HCC) - POCT glycosylated hemoglobin (Hb A1C) - 7.2 today. Continue diabetic medication as prescribed. Reviewed importane of following ADA diet.   2. Primary osteoarthritis of shoulders, bilateral - traMADol (ULTRAM) 50 MG tablet; Take 1 tablet (50 mg total) by mouth 2 (two) times daily as needed for moderate pain or severe pain.  Dispense: 60 tablet; Refill: 3  3. Essential hypertension Stable. Continue bp medication as prescribed.   4. Bilateral carotid artery stenosis Doing well after right carotid endacrtectomy. Regular visits with Dr. Lucky Cowboy as scheduled.   5. Gastroesophageal reflux disease without esophagitis Continue famotidine as prescribed.   General Counseling: Silvestre verbalizes understanding of the findings of todays visit and agrees with plan of treatment. I have discussed any further diagnostic evaluation that may be needed or ordered today. We also reviewed his medications today. he has been encouraged to call the office with any questions or concerns that should arise related to todays visit.     Orders Placed This Encounter  Procedures  . POCT glycosylated hemoglobin (Hb A1C)    Meds ordered this encounter  Medications  . traMADol (ULTRAM) 50 MG  tablet    Sig: Take 1 tablet (50 mg total) by mouth 2 (two) times daily as needed for moderate pain or severe pain.    Dispense:  60 tablet    Refill:  3    Order Specific Question:   Supervising Provider    Answer:   [name] [[id number]]    Time spent: 26  Minutes          Dr [name] Internal medicine

## 2018-01-16 ENCOUNTER — Other Ambulatory Visit: Payer: Self-pay

## 2018-01-16 MED ORDER — GABAPENTIN 100 MG PO CAPS: ORAL_CAPSULE | ORAL | 3 refills | Status: DC

## 2018-02-02 ENCOUNTER — Other Ambulatory Visit: Payer: Self-pay

## 2018-02-02 ENCOUNTER — Telehealth: Payer: Self-pay

## 2018-02-02 MED ORDER — LOVASTATIN 10 MG PO TABS: ORAL_TABLET | ORAL | 0 refills | Status: DC

## 2018-02-02 NOTE — Telephone Encounter (Signed)
[facility] called requesting for pt rx for lovastatin to be sent over because they did not have it from when the pt switched over from [facility] to [facility]. Spoke with Nimisha and she sent the rx in and confirmed with pharmacy that they received it and they did.

## 2018-02-08 ENCOUNTER — Telehealth: Payer: Self-pay | Admitting: Nurse Practitioner

## 2018-02-08 DIAGNOSIS — M19012 Primary osteoarthritis, left shoulder: Principal | ICD-10-CM

## 2018-02-08 DIAGNOSIS — M19011 Primary osteoarthritis, right shoulder: Secondary | ICD-10-CM

## 2018-02-08 MED ORDER — TRAMADOL HCL 50 MG PO TABS: 50.0000 mg | ORAL_TABLET | Freq: Two times a day (BID) | ORAL | 1 refills | Status: DC | PRN

## 2018-02-08 NOTE — Telephone Encounter (Signed)
rx refill for tramadol ok per heather for refill plus one

## 2018-02-27 ENCOUNTER — Telehealth: Payer: Self-pay | Admitting: Nurse Practitioner

## 2018-02-27 ENCOUNTER — Other Ambulatory Visit: Payer: Self-pay | Admitting: Nurse Practitioner

## 2018-02-27 DIAGNOSIS — J0140 Acute pansinusitis, unspecified: Secondary | ICD-10-CM

## 2018-02-27 DIAGNOSIS — B370 Candidal stomatitis: Secondary | ICD-10-CM

## 2018-02-27 DIAGNOSIS — R059 Cough, unspecified: Secondary | ICD-10-CM

## 2018-02-27 DIAGNOSIS — R05 Cough: Secondary | ICD-10-CM

## 2018-02-27 MED ORDER — BENZONATATE 200 MG PO CAPS: 200.0000 mg | ORAL_CAPSULE | Freq: Two times a day (BID) | ORAL | 0 refills | Status: DC | PRN

## 2018-02-27 MED ORDER — FLUCONAZOLE 150 MG PO TABS: ORAL_TABLET | ORAL | 0 refills | Status: DC

## 2018-02-27 MED ORDER — AZITHROMYCIN 250 MG PO TABS: ORAL_TABLET | ORAL | 0 refills | Status: DC

## 2018-02-27 NOTE — Telephone Encounter (Signed)
Patient called c/o cough, congestion, and sinust type symptoms. Sent rx for z-pack - take as directed. Also sent tessalon perls to take as needed for cough. Finally sent diflucan ito take as needed for thrush if it develops. All sent to [facility]

## 2018-02-27 NOTE — Progress Notes (Signed)
Patient called c/o cough, congestion, and sinust type symptoms. Sent rx for z-pack - take as directed. Also sent tessalon perls to take as needed for cough. Finally sent diflucan ito take as needed for thrush if it develops. All sent to [facility]

## 2018-03-03 ENCOUNTER — Other Ambulatory Visit: Payer: Self-pay

## 2018-03-03 MED ORDER — GABAPENTIN 100 MG PO CAPS: ORAL_CAPSULE | ORAL | 3 refills | Status: DC

## 2018-03-06 ENCOUNTER — Encounter: Payer: Self-pay | Admitting: Nurse Practitioner

## 2018-03-06 ENCOUNTER — Ambulatory Visit: Payer: 59 | Admitting: Nurse Practitioner

## 2018-03-06 VITALS — BP 129/69 | HR 71 | Resp 16 | Ht 70.0 in | Wt 180.0 lb

## 2018-03-06 DIAGNOSIS — E1165 Type 2 diabetes mellitus with hyperglycemia: Secondary | ICD-10-CM

## 2018-03-06 DIAGNOSIS — E782 Mixed hyperlipidemia: Secondary | ICD-10-CM | POA: Diagnosis not present

## 2018-03-06 DIAGNOSIS — I10 Essential (primary) hypertension: Secondary | ICD-10-CM

## 2018-03-06 DIAGNOSIS — J0140 Acute pansinusitis, unspecified: Secondary | ICD-10-CM | POA: Diagnosis not present

## 2018-03-06 DIAGNOSIS — M19011 Primary osteoarthritis, right shoulder: Secondary | ICD-10-CM | POA: Diagnosis not present

## 2018-03-06 DIAGNOSIS — R300 Dysuria: Secondary | ICD-10-CM

## 2018-03-06 DIAGNOSIS — M19012 Primary osteoarthritis, left shoulder: Secondary | ICD-10-CM | POA: Diagnosis not present

## 2018-03-06 LAB — POCT GLYCOSYLATED HEMOGLOBIN (HGB A1C): HEMOGLOBIN A1C: 7.5 % — AB (ref 4.0–5.6)

## 2018-03-06 MED ORDER — GLYBURIDE 2.5 MG PO TABS: 2.5000 mg | ORAL_TABLET | Freq: Two times a day (BID) | ORAL | 2 refills | Status: DC

## 2018-03-06 MED ORDER — TRAMADOL HCL 50 MG PO TABS: 50.0000 mg | ORAL_TABLET | Freq: Two times a day (BID) | ORAL | 2 refills | Status: DC | PRN

## 2018-03-06 NOTE — Progress Notes (Signed)
[place] [facility] [place], [place] [address]  Internal MEDICINE  Office Visit Note  Patient Name: [name]  [id number]  [id number]  Date of Service: 03/06/2018  Chief Complaint  Patient presents with  . Diabetes    Chronic low back and shoulder pain. Does take tramadol 50mg  tablets as needed to help with moderate to severe pain. He has bulging disk in the lumbar spine which is not operable. He has been taking this prescription for some time. Helps to relieve his pain and allows him to participate in usual activities and continue working. He has no negative side effects from taking this medication.   Diabetes  He presents for his follow-up diabetic visit. He has type 2 diabetes mellitus. No MedicAlert identification noted. His disease course has been worsening. There are no hypoglycemic associated symptoms. Pertinent negatives for hypoglycemia include no headaches, nervousness/anxiousness, pallor or tremors. Associated symptoms include fatigue. Pertinent negatives for diabetes include no chest pain. There are no hypoglycemic complications. Symptoms are stable. Diabetic complications include a CVA, peripheral neuropathy and PVD. Risk factors for coronary artery disease include diabetes mellitus, dyslipidemia, hypertension and male sex. Current diabetic treatment includes oral agent (monotherapy). He is compliant with treatment most of the time. His weight is stable. He is following a generally healthy diet. When asked about meal planning, he reported none. He has not had a previous visit with a dietitian. He participates in exercise intermittently. His home blood glucose trend is increasing steadily. An ACE inhibitor/angiotensin II receptor blocker is being taken. He sees a podiatrist.Eye exam is current.    Pt is here for routine follow up.    Current Medication: Outpatient Encounter Medications as of 03/06/2018  Medication Sig  . aspirin EC 81 MG tablet Take 81 mg by  mouth daily.  Marland Kitchen azithromycin (ZITHROMAX) 250 MG tablet z-pack - take as directed for 5 days  . benzonatate (TESSALON) 200 MG capsule Take 1 capsule (200 mg total) by mouth 2 (two) times daily as needed for cough.  . budesonide-formoterol (SYMBICORT) 160-4.5 MCG/ACT inhaler Inhale 2 puffs into the lungs 2 (two) times daily.  . Calcium Carbonate-Vitamin D (CALCIUM PLUS VITAMIN D PO) Take 1 tablet by mouth daily.  . clopidogrel (PLAVIX) 75 MG tablet Take 1 tablet (75 mg total) by mouth daily with breakfast.  . diphenhydrAMINE (BENADRYL) 25 mg capsule Take 25 mg by mouth every 6 (six) hours as needed for itching or allergies.   . famotidine (PEPCID) 20 MG tablet Take 20 mg by mouth daily as needed for heartburn or indigestion.  . fluconazole (DIFLUCAN) 150 MG tablet Take 1 tablet po once. May repeat dose in 3 days as needed for persistent symptoms.  Marland Kitchen gabapentin (NEURONTIN) 100 MG capsule TAKE 2 CAPSULES BY MOUTH UP TO THREE TIMES DAILY  . glyBURIDE (DIABETA) 2.5 MG tablet Take 1 tablet (2.5 mg total) by mouth 2 (two) times daily with a meal.  . HYDROcodone-acetaminophen (NORCO) 5-325 MG tablet Take 1-2 tablets by mouth every 6 (six) hours as needed for moderate pain.  Marland Kitchen ipratropium (ATROVENT HFA) 17 MCG/ACT inhaler Inhale 2 puffs into the lungs every 4 (four) hours as needed for wheezing.  Marland Kitchen lisinopril (PRINIVIL,ZESTRIL) 5 MG tablet TAKE 1 TABLET BY MOUTH ONCE DAILY FOR BLOOD PRESSURE  . lovastatin (MEVACOR) 10 MG tablet Take 1 tab  Po QHS  . metoprolol succinate (TOPROL-XL) 100 MG 24 hr tablet Take 1 tablet (100 mg total) by mouth daily. Take with or immediately  following a meal. (Patient taking differently: Take 100 mg by mouth daily. In am. Take with or immediately following a meal.)  . mometasone-formoterol (DULERA) 100-5 MCG/ACT AERO Inhale 2 puffs into the lungs 2 (two) times daily.  . Omega-3 Fatty Acids (FISH OIL PO) Take 1 capsule by mouth daily.  . polyethylene glycol-electrolytes  (NULYTELY/GOLYTELY) 420 g solution Take 4,000 mLs once by mouth.  . traMADol (ULTRAM) 50 MG tablet Take 1 tablet (50 mg total) by mouth 2 (two) times daily as needed for moderate pain or severe pain.  . vitamin B-12 (CYANOCOBALAMIN) 1000 MCG tablet Take 1,000 mcg by mouth daily.  . [DISCONTINUED] glyBURIDE (DIABETA) 2.5 MG tablet TAKE 1 TABLET BY MOUTH TWICE DAILY WITH FOOD  . [DISCONTINUED] traMADol (ULTRAM) 50 MG tablet Take 1 tablet (50 mg total) by mouth 2 (two) times daily as needed for moderate pain or severe pain.   No facility-administered encounter medications on file as of 03/06/2018.     Surgical History: Past Surgical History:  Procedure Laterality Date  . AMPUTATION TOE Left 02/25/2017   Procedure: AMPUTATION TOE-LEFT 2ND MPJ;  Surgeon: Samara Deist, DPM;  Location: [facility] ORS;  Service: Podiatry;  Laterality: Left;  . BACK SURGERY  2008  . CHOLECYSTECTOMY    . COLONOSCOPY WITH PROPOFOL N/A 08/15/2017   Procedure: COLONOSCOPY WITH PROPOFOL;  Surgeon: Lollie Sails, MD;  Location: [facility] ENDOSCOPY;  Service: Endoscopy;  Laterality: N/A;  . ENDARTERECTOMY Right 05/12/2017   Procedure: ENDARTERECTOMY CAROTID;  Surgeon: [name], MD;  Location: [facility] ORS;  Service: Vascular;  Laterality: Right;  . ERCP    . FOOT SURGERY Right    x 3    Medical History: Past Medical History:  Diagnosis Date  . Cholecystitis, chronic   . Cholelithiasis   . COPD (chronic obstructive pulmonary disease) ([place])   . Diabetes mellitus without complication ([place])   . Dyspnea   . Fatty liver 2008  . Fracture of clavicle 1992   left   . Fracture of ribs, multiple 1992   3 ribs  . GERD (gastroesophageal reflux disease)   . Hyperlipidemia   . Hypertension   . Paroxysmal SVT (supraventricular tachycardia) ([place])   . Pelvis fracture ([place]) 1992  . Pleurisy   . Pleurisy   . Seasonal allergies     Family History: Family History  Problem Relation Age of Onset  . Diabetes Sister     Social  History   Socioeconomic History  . Marital status: Single    Spouse name: Not on file  . Number of children: Not on file  . Years of education: Not on file  . Highest education level: Not on file  Occupational History  . Not on file  Social Needs  . Financial resource strain: Not on file  . Food insecurity:    Worry: Not on file    Inability: Not on file  . Transportation needs:    Medical: Not on file    Non-medical: Not on file  Tobacco Use  . Smoking status: Former Smoker    Last attempt to quit: 02/24/1997    Years since quitting: 21.0  . Smokeless tobacco: Never Used  Substance and Sexual Activity  . Alcohol use: No  . Drug use: No  . Sexual activity: Not on file  Lifestyle  . Physical activity:    Days per week: Not on file    Minutes per session: Not on file  . Stress: Not on file  Relationships  .  Social connections:    Talks on phone: Not on file    Gets together: Not on file    Attends religious service: Not on file    Active member of club or organization: Not on file    Attends meetings of clubs or organizations: Not on file    Relationship status: Not on file  . Intimate partner violence:    Fear of current or ex partner: Not on file    Emotionally abused: Not on file    Physically abused: Not on file    Forced sexual activity: Not on file  Other Topics Concern  . Not on file  Social History Narrative  . Not on file      Review of Systems  Constitutional: Positive for fatigue. Negative for activity change, chills and unexpected weight change.  HENT: Negative for congestion, postnasal drip, rhinorrhea, sneezing and sore throat.   Eyes: Negative.  Negative for redness.  Respiratory: Negative for cough, chest tightness, shortness of breath and wheezing.   Cardiovascular: Negative for chest pain and palpitations.  Gastrointestinal: Negative for abdominal distention, abdominal pain, anal bleeding, constipation, diarrhea, nausea and vomiting.   Endocrine:       Blood sugars doing well, overall.   Genitourinary: Negative for dysuria and frequency.  Musculoskeletal: Positive for arthralgias, back pain and myalgias. Negative for joint swelling and neck pain.       Chronic, bilateral shoulder pain. Intermittent and made worse with exertion.   Skin: Negative for color change, pallor, rash and wound.  Allergic/Immunologic: Positive for environmental allergies.  Neurological: Negative for tremors, numbness and headaches.  Hematological: Negative for adenopathy. Does not bruise/bleed easily.  Psychiatric/Behavioral: Negative for behavioral problems (Depression), sleep disturbance and suicidal ideas. The patient is not nervous/anxious.     Today's Vitals   03/06/18 0838  BP: 129/69  Pulse: 71  Resp: 16  SpO2: 95%  Weight: 180 lb (81.6 kg)  Height: 5\' 10"  (1.778 m)    Physical Exam  Constitutional: He is oriented to person, place, and time. He appears well-developed and well-nourished. No distress.  HENT:  Head: Normocephalic and atraumatic.  Mouth/Throat: Oropharynx is clear and moist. No oropharyngeal exudate.  Eyes: Pupils are equal, round, and reactive to light. EOM are normal.  Neck: Normal range of motion. Neck supple. No JVD present. Carotid bruit is not present. No tracheal deviation present. No thyromegaly present.  Surgical scar from right endarctectomy has healed well.   Cardiovascular: Normal rate, regular rhythm and normal heart sounds. Exam reveals no gallop and no friction rub.  No murmur heard. Pulmonary/Chest: Effort normal. No respiratory distress. He has no wheezes. He has no rales. He exhibits no tenderness.  Abdominal: Soft. Bowel sounds are normal. There is no tenderness.  Musculoskeletal:  Intermittent, bilateral shoulder pain, resulting in reduced ROM and strength in the shoulders. No bony abnormalities or deformities are currently noted.  Moderate lower back pain also. Worse with bending and twisting at  the waist. History of bulging disc in the lumbar spine. No visible or palpable abnormalities visible today   Lymphadenopathy:    He has no cervical adenopathy.  Neurological: He is alert and oriented to person, place, and time. No cranial nerve deficit.  Skin: Skin is warm and dry. He is not diaphoretic.  Psychiatric: He has a normal mood and affect. His behavior is normal. Judgment and thought content normal.  Nursing note and vitals reviewed.  Assessment/Plan: 1. Uncontrolled type 2 diabetes mellitus with hyperglycemia ([place]) -  POCT HgB A1C 7.5 today. Continue diabetic medication as prescribed. Patient to avoid sweets and carbohydrates and increase physical activity. Recheck in 3 months  - glyBURIDE (DIABETA) 2.5 MG tablet; Take 1 tablet (2.5 mg total) by mouth 2 (two) times daily with a meal.  Dispense: 180 tablet; Refill: 2  2. Essential hypertension Stable. Continue bp medication as prescribed.   3. Acute non-recurrent pansinusitis Treated with z-pack prior to this visit. Will finish today. Symptoms have resolved.   4. Primary osteoarthritis of shoulders, bilateral May continue to take tramadol as needed and as prescribed. New prescription provided today.  - traMADol (ULTRAM) 50 MG tablet; Take 1 tablet (50 mg total) by mouth 2 (two) times daily as needed for moderate pain or severe pain.  Dispense: 60 tablet; Refill: 2  5. Mixed hyperlipidemia Continue lovastatin as prescribed .  General Counseling: Maksymilian verbalizes understanding of the findings of todays visit and agrees with plan of treatment. I have discussed any further diagnostic evaluation that may be needed or ordered today. We also reviewed his medications today. he has been encouraged to call the office with any questions or concerns that should arise related to todays visit.    Counseling:  Diabetes Counseling:  1. Addition of ACE inh/ ARB'S for nephroprotection. 2. Diabetic foot care, prevention of complications.   3.Exercise and lose weight.  4. Diabetic eye examination, 5. Monitor blood sugar closlely. nutrition counseling.  6.Sign and symptoms of hypoglycemia including shaking sweating,confusion and headaches.   This patient was seen by Leretha Pol, FNP- C in Collaboration with Dr [name] as a part of collaborative care agreement  Orders Placed This Encounter  Procedures  . POCT HgB A1C    Meds ordered this encounter  Medications  . glyBURIDE (DIABETA) 2.5 MG tablet    Sig: Take 1 tablet (2.5 mg total) by mouth 2 (two) times daily with a meal.    Dispense:  180 tablet    Refill:  2    Order Specific Question:   Supervising Provider    Answer:   [name] [place]  . traMADol (ULTRAM) 50 MG tablet    Sig: Take 1 tablet (50 mg total) by mouth 2 (two) times daily as needed for moderate pain or severe pain.    Dispense:  60 tablet    Refill:  2    Order Specific Question:   Supervising Provider    Answer:   [name] [[id number]]    Time spent: 72 Minutes     Dr [name] Internal medicine

## 2018-03-06 NOTE — Addendum Note (Signed)
Addended by: Radene Knee on: 03/06/2018 09:40 AM   Modules accepted: Orders

## 2018-03-06 NOTE — Addendum Note (Signed)
Addended by: Radene Knee on: 03/06/2018 09:41 AM   Modules accepted: Orders

## 2018-03-16 ENCOUNTER — Other Ambulatory Visit: Payer: Self-pay | Admitting: Nurse Practitioner

## 2018-03-16 ENCOUNTER — Telehealth: Payer: Self-pay

## 2018-03-16 DIAGNOSIS — J0140 Acute pansinusitis, unspecified: Secondary | ICD-10-CM

## 2018-03-16 DIAGNOSIS — R059 Cough, unspecified: Secondary | ICD-10-CM

## 2018-03-16 DIAGNOSIS — B370 Candidal stomatitis: Secondary | ICD-10-CM

## 2018-03-16 DIAGNOSIS — R05 Cough: Secondary | ICD-10-CM

## 2018-03-16 NOTE — Telephone Encounter (Signed)
If he hurt his thumb overnight and can't move it, he should really have it checked out in urgent care. He can take OTC tylenol and ibuprofen for pain and inflammation. Needs to ice the injury in 20 minute intervals for next 3 days.

## 2018-03-16 NOTE — Telephone Encounter (Signed)
Left message for pt to call back  °

## 2018-03-16 NOTE — Telephone Encounter (Signed)
Informed pt to ice finger and take OTC medications

## 2018-03-24 ENCOUNTER — Encounter (INDEPENDENT_AMBULATORY_CARE_PROVIDER_SITE_OTHER): Payer: 59

## 2018-03-24 ENCOUNTER — Ambulatory Visit (INDEPENDENT_AMBULATORY_CARE_PROVIDER_SITE_OTHER): Payer: 59 | Admitting: Vascular Surgery

## 2018-04-03 ENCOUNTER — Encounter: Payer: Self-pay | Admitting: Nurse Practitioner

## 2018-04-03 ENCOUNTER — Ambulatory Visit: Payer: 59 | Admitting: Nurse Practitioner

## 2018-04-03 VITALS — BP 105/63 | HR 63 | Resp 16 | Ht 70.0 in | Wt 179.4 lb

## 2018-04-03 DIAGNOSIS — I10 Essential (primary) hypertension: Secondary | ICD-10-CM

## 2018-04-03 DIAGNOSIS — E1165 Type 2 diabetes mellitus with hyperglycemia: Secondary | ICD-10-CM | POA: Diagnosis not present

## 2018-04-03 DIAGNOSIS — J0140 Acute pansinusitis, unspecified: Secondary | ICD-10-CM

## 2018-04-03 DIAGNOSIS — J029 Acute pharyngitis, unspecified: Secondary | ICD-10-CM | POA: Diagnosis not present

## 2018-04-03 DIAGNOSIS — B370 Candidal stomatitis: Secondary | ICD-10-CM | POA: Diagnosis not present

## 2018-04-03 LAB — POCT RAPID STREP A (OFFICE): Rapid Strep A Screen: NEGATIVE

## 2018-04-03 MED ORDER — AZITHROMYCIN 250 MG PO TABS: ORAL_TABLET | ORAL | 0 refills | Status: DC

## 2018-04-03 MED ORDER — FLUCONAZOLE 150 MG PO TABS: ORAL_TABLET | ORAL | 0 refills | Status: DC

## 2018-04-03 MED ORDER — FIRST-DUKES MOUTHWASH MT SUSP: OROMUCOSAL | 1 refills | Status: DC

## 2018-04-03 NOTE — Progress Notes (Signed)
[facility] [place], [place] [address]  Internal MEDICINE  Office Visit Note  Patient Name: [name]  [id number]  [id number]  Date of Service: 04/03/2018    Pt is here for a sick visit.  Chief Complaint  Patient presents with  . Sore Throat    hurts when swalloeing been going on for a week now, been taking OTC and not working. no fever or chills  . Nasal Congestion     Sore Throat   This is a new problem. The current episode started in the past 7 days. The problem has been unchanged. Neither side of throat is experiencing more pain than the other. There has been no fever. The pain is at a severity of 6/10. The pain is moderate. Associated symptoms include congestion and a hoarse voice. Pertinent negatives include no abdominal pain, coughing, diarrhea, headaches, neck pain, shortness of breath or vomiting. He has tried gargles for the symptoms. The treatment provided mild relief.       Current Medication:  Outpatient Encounter Medications as of 04/03/2018  Medication Sig  . aspirin EC 81 MG tablet Take 81 mg by mouth daily.  Marland Kitchen azithromycin (ZITHROMAX) 250 MG tablet z-pack - take as directed for 5 days  . benzonatate (TESSALON) 200 MG capsule Take 1 capsule (200 mg total) by mouth 2 (two) times daily as needed for cough.  . budesonide-formoterol (SYMBICORT) 160-4.5 MCG/ACT inhaler Inhale 2 puffs into the lungs 2 (two) times daily.  . Calcium Carbonate-Vitamin D (CALCIUM PLUS VITAMIN D PO) Take 1 tablet by mouth daily.  . clopidogrel (PLAVIX) 75 MG tablet Take 1 tablet (75 mg total) by mouth daily with breakfast.  . diphenhydrAMINE (BENADRYL) 25 mg capsule Take 25 mg by mouth every 6 (six) hours as needed for itching or allergies.   . famotidine (PEPCID) 20 MG tablet Take 20 mg by mouth daily as needed for heartburn or indigestion.  . fluconazole (DIFLUCAN) 150 MG tablet Take 1 tablet po once. May repeat dose in 3 days as needed for persistent  symptoms.  Marland Kitchen gabapentin (NEURONTIN) 100 MG capsule TAKE 2 CAPSULES BY MOUTH UP TO THREE TIMES DAILY  . glyBURIDE (DIABETA) 2.5 MG tablet Take 1 tablet (2.5 mg total) by mouth 2 (two) times daily with a meal.  . HYDROcodone-acetaminophen (NORCO) 5-325 MG tablet Take 1-2 tablets by mouth every 6 (six) hours as needed for moderate pain.  Marland Kitchen ipratropium (ATROVENT HFA) 17 MCG/ACT inhaler Inhale 2 puffs into the lungs every 4 (four) hours as needed for wheezing.  Marland Kitchen lisinopril (PRINIVIL,ZESTRIL) 5 MG tablet TAKE 1 TABLET BY MOUTH ONCE DAILY FOR BLOOD PRESSURE  . lovastatin (MEVACOR) 10 MG tablet Take 1 tab  Po QHS  . metoprolol succinate (TOPROL-XL) 100 MG 24 hr tablet Take 1 tablet (100 mg total) by mouth daily. Take with or immediately following a meal. (Patient taking differently: Take 100 mg by mouth daily. In am. Take with or immediately following a meal.)  . mometasone-formoterol (DULERA) 100-5 MCG/ACT AERO Inhale 2 puffs into the lungs 2 (two) times daily.  . Omega-3 Fatty Acids (FISH OIL PO) Take 1 capsule by mouth daily.  . polyethylene glycol-electrolytes (NULYTELY/GOLYTELY) 420 g solution Take 4,000 mLs once by mouth.  . traMADol (ULTRAM) 50 MG tablet Take 1 tablet (50 mg total) by mouth 2 (two) times daily as needed for moderate pain or severe pain.  . vitamin B-12 (CYANOCOBALAMIN) 1000 MCG tablet Take 1,000 mcg by mouth daily.  . [  DISCONTINUED] azithromycin (ZITHROMAX) 250 MG tablet z-pack - take as directed for 5 days  . [DISCONTINUED] fluconazole (DIFLUCAN) 150 MG tablet Take 1 tablet po once. May repeat dose in 3 days as needed for persistent symptoms.  . Diphenhyd-Hydrocort-Nystatin (FIRST-DUKES MOUTHWASH) SUSP Swish and swallow with 43mls QID prn sore throat.   No facility-administered encounter medications on file as of 04/03/2018.       Medical History: Past Medical History:  Diagnosis Date  . Cholecystitis, chronic   . Cholelithiasis   . COPD (chronic obstructive pulmonary  disease) ([place])   . Diabetes mellitus without complication ([place])   . Dyspnea   . Fatty liver 2008  . Fracture of clavicle 1992   left   . Fracture of ribs, multiple 1992   3 ribs  . GERD (gastroesophageal reflux disease)   . Hyperlipidemia   . Hypertension   . Paroxysmal SVT (supraventricular tachycardia) ([place])   . Pelvis fracture ([place]) 1992  . Pleurisy   . Pleurisy   . Seasonal allergies     Today's Vitals   04/03/18 1022  BP: 105/63  Pulse: 63  Resp: 16  SpO2: 94%  Weight: 179 lb 6.4 oz (81.4 kg)  Height: 5\' 10"  (1.778 m)    Review of Systems  Constitutional: Negative for chills, fatigue, fever and unexpected weight change.  HENT: Positive for congestion, hoarse voice, postnasal drip, sore throat and voice change. Negative for rhinorrhea and sneezing.   Eyes: Negative.  Negative for redness.  Respiratory: Negative for cough, chest tightness, shortness of breath and wheezing.   Cardiovascular: Negative for chest pain and palpitations.  Gastrointestinal: Negative for abdominal pain, constipation, diarrhea, nausea and vomiting.  Endocrine:       Blood sugars doing well   Genitourinary: Negative for dysuria and frequency.  Musculoskeletal: Negative for arthralgias, back pain, joint swelling and neck pain.  Skin: Negative for rash.  Allergic/Immunologic: Positive for environmental allergies.  Neurological: Negative for dizziness, tremors, numbness and headaches.  Hematological: Negative for adenopathy. Does not bruise/bleed easily.  Psychiatric/Behavioral: Negative for behavioral problems (Depression), sleep disturbance and suicidal ideas. The patient is not nervous/anxious.     Physical Exam  Constitutional: He is oriented to person, place, and time. He appears well-developed and well-nourished. No distress.  HENT:  Head: Normocephalic and atraumatic.  Right Ear: Ear canal normal.  Left Ear: Ear canal normal.  Mouth/Throat: Oropharyngeal exudate and posterior  oropharyngeal edema present. Tonsils are 1+ on the right. Tonsils are 1+ on the left.  Plaques of thrush present on the tongue.   Eyes: Pupils are equal, round, and reactive to light. EOM are normal.  Neck: Normal range of motion. Neck supple. No JVD present. No tracheal deviation present. No thyromegaly present.  Cardiovascular: Normal rate, regular rhythm and normal heart sounds. Exam reveals no gallop and no friction rub.  No murmur heard. Pulmonary/Chest: Effort normal. No respiratory distress. He has no wheezes. He has no rales. He exhibits no tenderness.  Abdominal: Soft. Bowel sounds are normal.  Musculoskeletal: Normal range of motion.  Neurological: He is alert and oriented to person, place, and time. No cranial nerve deficit.  Skin: Skin is warm and dry. He is not diaphoretic.  Psychiatric: He has a normal mood and affect. His behavior is normal. Judgment and thought content normal.  Nursing note and vitals reviewed.  Assessment/Plan: 1. Sore throat - POCT rapid strep A - negative. Sent rx for [facility]'s mouthwash. Swish and swallow with 49mls four times daily  as needed for sore throat. May also gargle with warm salt water as needed . - Diphenhyd-Hydrocort-Nystatin (FIRST-DUKES MOUTHWASH) SUSP; Swish and swallow with 18mls QID prn sore throat.  Dispense: 200 mL; Refill: 1  2. Acute non-recurrent pansinusitis z-pack. Take as directed for 5 days. Rest and increase fluids. OTC tylenol as needed for pain/fever.  - azithromycin (ZITHROMAX) 250 MG tablet; z-pack - take as directed for 5 days  Dispense: 6 tablet; Refill: 0  3. Oral candidiasis Diflucan as needed and as prescribed.  - fluconazole (DIFLUCAN) 150 MG tablet; Take 1 tablet po once. May repeat dose in 3 days as needed for persistent symptoms.  Dispense: 3 tablet; Refill: 0  4. Essential hypertension Continue bp medication as prescribed .  5. Uncontrolled type 2 diabetes mellitus with hyperglycemia ([place]) Continue diabetic  medication as prescribed   General Counseling: Shneur verbalizes understanding of the findings of todays visit and agrees with plan of treatment. I have discussed any further diagnostic evaluation that may be needed or ordered today. We also reviewed his medications today. he has been encouraged to call the office with any questions or concerns that should arise related to todays visit.    Counseling:  Rest and increase fluids. Continue using OTC medication to control symptoms.   This patient was seen by Leretha Pol, FNP- C in Collaboration with Dr [name] as a part of collaborative care agreement  Orders Placed This Encounter  Procedures  . POCT rapid strep A    Meds ordered this encounter  Medications  . azithromycin (ZITHROMAX) 250 MG tablet    Sig: z-pack - take as directed for 5 days    Dispense:  6 tablet    Refill:  0    Order Specific Question:   Supervising Provider    Answer:   [name] Akins  . fluconazole (DIFLUCAN) 150 MG tablet    Sig: Take 1 tablet po once. May repeat dose in 3 days as needed for persistent symptoms.    Dispense:  3 tablet    Refill:  0    Order Specific Question:   Supervising Provider    Answer:   [name] [[id number]]  . Diphenhyd-Hydrocort-Nystatin (FIRST-DUKES MOUTHWASH) SUSP    Sig: Swish and swallow with 67mls QID prn sore throat.    Dispense:  200 mL    Refill:  1    Order Specific Question:   Supervising Provider    Answer:   [name] [[id number]]    Time spent: 15 Minutes

## 2018-04-25 ENCOUNTER — Other Ambulatory Visit: Payer: Self-pay | Admitting: Internal Medicine

## 2018-04-25 NOTE — Telephone Encounter (Signed)
This encounter was created in error - please disregard.

## 2018-05-15 ENCOUNTER — Other Ambulatory Visit: Payer: Self-pay

## 2018-05-15 MED ORDER — LOVASTATIN 10 MG PO TABS: ORAL_TABLET | ORAL | 0 refills | Status: DC

## 2018-05-22 ENCOUNTER — Other Ambulatory Visit: Payer: Self-pay

## 2018-05-22 MED ORDER — LOVASTATIN 10 MG PO TABS: ORAL_TABLET | ORAL | 0 refills | Status: DC

## 2018-06-05 ENCOUNTER — Other Ambulatory Visit: Payer: Self-pay

## 2018-06-05 DIAGNOSIS — M19011 Primary osteoarthritis, right shoulder: Secondary | ICD-10-CM

## 2018-06-05 DIAGNOSIS — M19012 Primary osteoarthritis, left shoulder: Principal | ICD-10-CM

## 2018-06-05 MED ORDER — TRAMADOL HCL 50 MG PO TABS: 50.0000 mg | ORAL_TABLET | Freq: Two times a day (BID) | ORAL | 2 refills | Status: DC | PRN

## 2018-06-06 ENCOUNTER — Encounter: Payer: Self-pay | Admitting: Adult Health

## 2018-06-06 ENCOUNTER — Ambulatory Visit: Payer: 59 | Admitting: Adult Health

## 2018-06-06 VITALS — BP 132/82 | HR 85 | Resp 16 | Ht 70.0 in | Wt 179.0 lb

## 2018-06-06 DIAGNOSIS — Z0001 Encounter for general adult medical examination with abnormal findings: Secondary | ICD-10-CM | POA: Diagnosis not present

## 2018-06-06 DIAGNOSIS — M19011 Primary osteoarthritis, right shoulder: Secondary | ICD-10-CM

## 2018-06-06 DIAGNOSIS — E1165 Type 2 diabetes mellitus with hyperglycemia: Secondary | ICD-10-CM

## 2018-06-06 DIAGNOSIS — I10 Essential (primary) hypertension: Secondary | ICD-10-CM | POA: Diagnosis not present

## 2018-06-06 DIAGNOSIS — R300 Dysuria: Secondary | ICD-10-CM

## 2018-06-06 DIAGNOSIS — K219 Gastro-esophageal reflux disease without esophagitis: Secondary | ICD-10-CM

## 2018-06-06 DIAGNOSIS — E782 Mixed hyperlipidemia: Secondary | ICD-10-CM

## 2018-06-06 DIAGNOSIS — M19012 Primary osteoarthritis, left shoulder: Secondary | ICD-10-CM

## 2018-06-06 DIAGNOSIS — Z7722 Contact with and (suspected) exposure to environmental tobacco smoke (acute) (chronic): Secondary | ICD-10-CM

## 2018-06-06 DIAGNOSIS — Z125 Encounter for screening for malignant neoplasm of prostate: Secondary | ICD-10-CM

## 2018-06-06 DIAGNOSIS — Z114 Encounter for screening for human immunodeficiency virus [HIV]: Secondary | ICD-10-CM

## 2018-06-06 DIAGNOSIS — Z1159 Encounter for screening for other viral diseases: Secondary | ICD-10-CM

## 2018-06-06 LAB — POCT GLYCOSYLATED HEMOGLOBIN (HGB A1C): Hemoglobin A1C: 6.7 % — AB (ref 4.0–5.6)

## 2018-06-06 NOTE — Patient Instructions (Signed)

## 2018-06-06 NOTE — Progress Notes (Signed)
[facility] [place], [place] [address]  Internal MEDICINE  Office Visit Note  Patient Name: [name]  [id number]  [id number]  Date of Service: 06/14/2018  Chief Complaint  Patient presents with  . Annual Exam  . Diabetes  . Hyperlipidemia  . Hypertension  . Gastroesophageal Reflux   HPI Pt is here for routine health maintenance examination.  He has a history of DM, OA, HLD, HTN and GERD. He quit smoking in the 90's, but his wife smokes in the home. His A1C today is 6.7, down from 7.5.  He reports taking medications as directed. He denies current issues.  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 He is planning to retire in two months.        Current Medication: Outpatient Encounter Medications as of 06/06/2018  Medication Sig  . aspirin EC 81 MG tablet Take 81 mg by mouth daily.  . budesonide-formoterol (SYMBICORT) 160-4.5 MCG/ACT inhaler Inhale 2 puffs into the lungs 2 (two) times daily.  . Calcium Carbonate-Vitamin D (CALCIUM PLUS VITAMIN D PO) Take 1 tablet by mouth daily.  . clopidogrel (PLAVIX) 75 MG tablet Take 1 tablet (75 mg total) by mouth daily with breakfast.  . diphenhydrAMINE (BENADRYL) 25 mg capsule Take 25 mg by mouth every 6 (six) hours as needed for itching or allergies.   . famotidine (PEPCID) 20 MG tablet Take 20 mg by mouth daily as needed for heartburn or indigestion.  . gabapentin (NEURONTIN) 100 MG capsule TAKE 2 CAPSULES BY MOUTH UP TO THREE TIMES DAILY  . glyBURIDE (DIABETA) 2.5 MG tablet Take 1 tablet (2.5 mg total) by mouth 2 (two) times daily with a meal.  . lisinopril (PRINIVIL,ZESTRIL) 5 MG tablet TAKE 1 TABLET BY MOUTH ONCE DAILY FOR BLOOD PRESSURE.  . lovastatin (MEVACOR) 10 MG tablet Take 1 tab  Po QHS  . metoprolol succinate (TOPROL-XL) 100 MG 24 hr tablet Take 1 tablet (100 mg total) by mouth daily. Take with or immediately following a meal. (Patient taking differently: Take 100 mg by mouth daily. In am. Take  with or immediately following a meal.)  . mometasone-formoterol (DULERA) 100-5 MCG/ACT AERO Inhale 2 puffs into the lungs 2 (two) times daily.  . Omega-3 Fatty Acids (FISH OIL PO) Take 1 capsule by mouth daily.  . traMADol (ULTRAM) 50 MG tablet Take 1 tablet (50 mg total) by mouth 2 (two) times daily as needed for moderate pain or severe pain.  . vitamin B-12 (CYANOCOBALAMIN) 1000 MCG tablet Take 1,000 mcg by mouth daily.  . Diphenhyd-Hydrocort-Nystatin (FIRST-DUKES MOUTHWASH) SUSP Swish and swallow with 2mls QID prn sore throat. (Patient not taking: Reported on 06/06/2018)  . fluconazole (DIFLUCAN) 150 MG tablet Take 1 tablet po once. May repeat dose in 3 days as needed for persistent symptoms. (Patient not taking: Reported on 06/06/2018)  . HYDROcodone-acetaminophen (NORCO) 5-325 MG tablet Take 1-2 tablets by mouth every 6 (six) hours as needed for moderate pain. (Patient not taking: Reported on 06/06/2018)  . ipratropium (ATROVENT HFA) 17 MCG/ACT inhaler Inhale 2 puffs into the lungs every 4 (four) hours as needed for wheezing.  . polyethylene glycol-electrolytes (NULYTELY/GOLYTELY) 420 g solution Take 4,000 mLs once by mouth.  . [DISCONTINUED] azithromycin (ZITHROMAX) 250 MG tablet z-pack - take as directed for 5 days (Patient not taking: Reported on 06/06/2018)  . [DISCONTINUED] benzonatate (TESSALON) 200 MG capsule Take 1 capsule (200 mg total) by mouth 2 (two) times daily as needed for cough. (Patient  not taking: Reported on 06/06/2018)   No facility-administered encounter medications on file as of 06/06/2018.    Surgical History: Past Surgical History:  Procedure Laterality Date  . AMPUTATION TOE Left 02/25/2017   Procedure: AMPUTATION TOE-LEFT 2ND MPJ;  Surgeon: Samara Deist, DPM;  Location: [facility] ORS;  Service: Podiatry;  Laterality: Left;  . BACK SURGERY  2008  . CHOLECYSTECTOMY    . COLONOSCOPY WITH PROPOFOL N/A 08/15/2017   Procedure: COLONOSCOPY WITH PROPOFOL;  Surgeon: Lollie Sails, MD;  Location: [facility] ENDOSCOPY;  Service: Endoscopy;  Laterality: N/A;  . ENDARTERECTOMY Right 05/12/2017   Procedure: ENDARTERECTOMY CAROTID;  Surgeon: [name], MD;  Location: [facility] ORS;  Service: Vascular;  Laterality: Right;  . ERCP    . FOOT SURGERY Right    x 3   Medical History: Past Medical History:  Diagnosis Date  . Cholecystitis, chronic   . Cholelithiasis   . COPD (chronic obstructive pulmonary disease) ([place])   . Diabetes mellitus without complication ([place])   . Dyspnea   . Fatty liver 2008  . Fracture of clavicle 1992   left   . Fracture of ribs, multiple 1992   3 ribs  . GERD (gastroesophageal reflux disease)   . Hyperlipidemia   . Hypertension   . Paroxysmal SVT (supraventricular tachycardia) ([place])   . Pelvis fracture ([place]) 1992  . Pleurisy   . Pleurisy   . Seasonal allergies    Family History: Family History  Problem Relation Age of Onset  . Diabetes Sister    Review of Systems  Constitutional: Negative.  Negative for chills, fatigue and unexpected weight change.  HENT: Negative.  Negative for congestion, rhinorrhea, sneezing and sore throat.   Eyes: Negative for redness.  Respiratory: Negative.  Negative for cough, chest tightness and shortness of breath.   Cardiovascular: Negative.  Negative for chest pain and palpitations.  Gastrointestinal: Negative.  Negative for abdominal pain, constipation, diarrhea, nausea and vomiting.  Endocrine: Negative.   Genitourinary: Negative.  Negative for dysuria and frequency.  Musculoskeletal: Negative.  Negative for arthralgias, back pain, joint swelling and neck pain.  Skin: Negative.  Negative for rash.  Allergic/Immunologic: Negative.   Neurological: Negative.  Negative for tremors and numbness.  Hematological: Negative for adenopathy. Does not bruise/bleed easily.  Psychiatric/Behavioral: Negative.  Negative for behavioral problems, sleep disturbance and suicidal ideas. The patient is not nervous/anxious.     Vital Signs: BP 132/82   Pulse 85   Resp 16   Ht 5\' 10"  (1.778 m)   Wt 179 lb (81.2 kg)   SpO2 (!) 89%   BMI 25.68 kg/m   Physical Exam  Constitutional: He is oriented to person, place, and time. He appears well-developed and well-nourished. No distress.  HENT:  Head: Normocephalic and atraumatic.  Mouth/Throat: Oropharynx is clear and moist. No oropharyngeal exudate.  Eyes: Pupils are equal, round, and reactive to light. EOM are normal.  Neck: Normal range of motion. Neck supple. No JVD present. No tracheal deviation present. No thyromegaly present.  Cardiovascular: Normal rate, regular rhythm and normal heart sounds. Exam reveals no gallop and no friction rub.  No murmur heard. Pulmonary/Chest: Effort normal and breath sounds normal. No respiratory distress. He has no wheezes. He has no rales. He exhibits no tenderness.  Abdominal: Soft. There is no tenderness. There is no guarding.  Musculoskeletal: Normal range of motion.  Lymphadenopathy:    He has no cervical adenopathy.  Neurological: He is alert and oriented to person, place, and  time. No cranial nerve deficit.  Skin: Skin is warm and dry. He is not diaphoretic.  Psychiatric: He has a normal mood and affect. His behavior is normal. Judgment and thought content normal.  Nursing note and vitals reviewed.  LABS: Recent Results (from the past 2160 hour(s))  POCT rapid strep A     Status: None   Collection Time: 04/03/18 10:42 AM  Result Value Ref Range   Rapid Strep A Screen Negative Negative  UA/M w/rflx Culture, Routine     Status: Abnormal   Collection Time: 06/06/18 10:29 AM  Result Value Ref Range   Specific Gravity, UA 1.020 1.005 - 1.030   pH, UA 5.0 5.0 - 7.5   Color, UA Yellow Yellow   Appearance Ur Clear Clear   Leukocytes, UA Negative Negative   Protein, UA Negative Negative/Trace   Glucose, UA 1+ (A) Negative   Ketones, UA Negative Negative   RBC, UA Negative Negative   Bilirubin, UA Negative  Negative   Urobilinogen, Ur 0.2 0.2 - 1.0 mg/dL   Nitrite, UA Negative Negative   Microscopic Examination Comment     Comment: Microscopic follows if indicated.   Microscopic Examination See below:     Comment: Microscopic was indicated and was performed.   Urinalysis Reflex Comment     Comment: This specimen will not reflex to a Urine Culture.  Microscopic Examination     Status: Abnormal   Collection Time: 06/06/18 10:29 AM  Result Value Ref Range   WBC, UA 0-5 0 - 5 /hpf   RBC, UA 0-2 0 - 2 /hpf   Epithelial Cells (non renal) None seen 0 - 10 /hpf   Casts Present (A) None seen /lpf   Cast Type Hyaline casts N/A   Mucus, UA Present Not Estab.   Bacteria, UA None seen None seen/Few  POCT HgB A1C     Status: Abnormal   Collection Time: 06/06/18 10:49 AM  Result Value Ref Range   Hemoglobin A1C 6.7 (A) 4.0 - 5.6 %   HbA1c POC (<> result, manual entry)     HbA1c, POC (prediabetic range)     HbA1c, POC (controlled diabetic range)     Assessment/Plan: 1. Encounter for general adult medical examination with abnormal findings Will review labs. - CBC with Differential/Platelet - Lipid Panel With LDL/HDL Ratio - TSH - T4, free - Comprehensive metabolic panel  2. Uncontrolled type 2 diabetes mellitus with hyperglycemia ([facility]) - POCT HgB A1C  3. Essential hypertension Stable on current regimen.  Continues as directed.   4. Gastroesophageal reflux disease without esophagitis Pt uses Pepcid with good results.  Continue as directed.   5. Mixed hyperlipidemia Will check labs, and treat accordingly  6. Primary osteoarthritis of shoulders, bilateral Pt takes Tramadol with good relief.   7. Second hand smoke exposure Pt lives with partner who smokes. Educated patient to stay away from her when actively smoking.    8. Dysuria - UA/M w/rflx Culture, Routine  9. Screening for prostate cancer - PSA  10. Screening for HIV without presence of risk factors - HIV antibody (with  reflex)  11. Encounter for hepatitis C screening test for low risk patient - Hepatitis c antibody (reflex)  General Counseling: Horatio verbalizes understanding of the findings of todays visit and agrees with plan of treatment. I have discussed any further diagnostic evaluation that may be needed or ordered today. We also reviewed his medications today. he has been encouraged to call the office with any  questions or concerns that should arise related to todays visit.   Orders Placed This Encounter  Procedures  . Microscopic Examination  . UA/M w/rflx Culture, Routine  . CBC with Differential/Platelet  . Lipid Panel With LDL/HDL Ratio  . TSH  . T4, free  . Comprehensive metabolic panel  . PSA  . Hepatitis c antibody (reflex)  . HIV antibody (with reflex)  . POCT HgB A1C   Time spent 25 Minutes  This patient was seen by Orson Gear AGNP-C in Collaboration with Dr [name] as a part of collaborative care agreement   [name], MD  Internal Medicine

## 2018-06-07 LAB — UA/M W/RFLX CULTURE, ROUTINE
BILIRUBIN UA: NEGATIVE
KETONES UA: NEGATIVE
Leukocytes, UA: NEGATIVE
NITRITE UA: NEGATIVE
Protein, UA: NEGATIVE
RBC, UA: NEGATIVE
Specific Gravity, UA: 1.02 (ref 1.005–1.030)
UUROB: 0.2 mg/dL (ref 0.2–1.0)
pH, UA: 5 (ref 5.0–7.5)

## 2018-06-07 LAB — MICROSCOPIC EXAMINATION
Bacteria, UA: NONE SEEN
Epithelial Cells (non renal): NONE SEEN /hpf (ref 0–10)

## 2018-06-27 ENCOUNTER — Ambulatory Visit (INDEPENDENT_AMBULATORY_CARE_PROVIDER_SITE_OTHER): Payer: 59

## 2018-06-27 ENCOUNTER — Encounter (INDEPENDENT_AMBULATORY_CARE_PROVIDER_SITE_OTHER): Payer: Self-pay | Admitting: Vascular Surgery

## 2018-06-27 ENCOUNTER — Ambulatory Visit (INDEPENDENT_AMBULATORY_CARE_PROVIDER_SITE_OTHER): Payer: 59 | Admitting: Vascular Surgery

## 2018-06-27 VITALS — BP 90/64 | HR 71 | Resp 16 | Ht 70.0 in | Wt 175.0 lb

## 2018-06-27 DIAGNOSIS — E118 Type 2 diabetes mellitus with unspecified complications: Secondary | ICD-10-CM | POA: Diagnosis not present

## 2018-06-27 DIAGNOSIS — Z87891 Personal history of nicotine dependence: Secondary | ICD-10-CM

## 2018-06-27 DIAGNOSIS — I6523 Occlusion and stenosis of bilateral carotid arteries: Secondary | ICD-10-CM

## 2018-06-27 DIAGNOSIS — I10 Essential (primary) hypertension: Secondary | ICD-10-CM | POA: Diagnosis not present

## 2018-06-27 DIAGNOSIS — I739 Peripheral vascular disease, unspecified: Secondary | ICD-10-CM | POA: Diagnosis not present

## 2018-06-27 NOTE — Progress Notes (Signed)
MRN : [id number]  [name] is a 67 y.o. (05-12-1951) male who presents with chief complaint of  Chief Complaint  Patient presents with  . Follow-up    62month carotid and abi results  .  History of Present Illness: Patient returns in follow-up of multiple vascular issues.  He is doing well without any specific complaints today.  He has some occasional left leg pain but nothing that is debilitating.  His ABIs today are 0.92 on the right and 0.71 on the left.  These are stable from previous studies. He denies any focal neurologic symptoms. Specifically, the patient denies amaurosis fugax, speech or swallowing difficulties, or arm or leg weakness or numbness.  Carotid duplex today reveals stable, 40 to 59% carotid artery stenosis bilaterally  Current Outpatient Medications  Medication Sig Dispense Refill  . aspirin EC 81 MG tablet Take 81 mg by mouth daily.    . budesonide-formoterol (SYMBICORT) 160-4.5 MCG/ACT inhaler Inhale 2 puffs into the lungs 2 (two) times daily.    . Calcium Carbonate-Vitamin D (CALCIUM PLUS VITAMIN D PO) Take 1 tablet by mouth daily.    . clopidogrel (PLAVIX) 75 MG tablet Take 1 tablet (75 mg total) by mouth daily with breakfast. 30 tablet 5  . diphenhydrAMINE (BENADRYL) 25 mg capsule Take 25 mg by mouth every 6 (six) hours as needed for itching or allergies.     . famotidine (PEPCID) 20 MG tablet Take 20 mg by mouth daily as needed for heartburn or indigestion.    . gabapentin (NEURONTIN) 100 MG capsule TAKE 2 CAPSULES BY MOUTH UP TO THREE TIMES DAILY 180 capsule 3  . glyBURIDE (DIABETA) 2.5 MG tablet Take 1 tablet (2.5 mg total) by mouth 2 (two) times daily with a meal. 180 tablet 2  . ipratropium (ATROVENT HFA) 17 MCG/ACT inhaler Inhale 2 puffs into the lungs every 4 (four) hours as needed for wheezing.    Marland Kitchen lisinopril (PRINIVIL,ZESTRIL) 5 MG tablet TAKE 1 TABLET BY MOUTH ONCE DAILY FOR BLOOD PRESSURE. 90 tablet 1  . lovastatin (MEVACOR) 10 MG tablet Take 1  tab  Po QHS 90 tablet 0  . metoprolol succinate (TOPROL-XL) 100 MG 24 hr tablet Take 1 tablet (100 mg total) by mouth daily. Take with or immediately following a meal. (Patient taking differently: Take 100 mg by mouth daily. In am. Take with or immediately following a meal.) 30 tablet 0  . mometasone-formoterol (DULERA) 100-5 MCG/ACT AERO Inhale 2 puffs into the lungs 2 (two) times daily.    . Omega-3 Fatty Acids (FISH OIL PO) Take 1 capsule by mouth daily.    . polyethylene glycol-electrolytes (NULYTELY/GOLYTELY) 420 g solution Take 4,000 mLs once by mouth.    . traMADol (ULTRAM) 50 MG tablet Take 1 tablet (50 mg total) by mouth 2 (two) times daily as needed for moderate pain or severe pain. 60 tablet 2  . vitamin B-12 (CYANOCOBALAMIN) 1000 MCG tablet Take 1,000 mcg by mouth daily.    . Diphenhyd-Hydrocort-Nystatin (FIRST-DUKES MOUTHWASH) SUSP Swish and swallow with 60mls QID prn sore throat. (Patient not taking: Reported on 06/06/2018) 200 mL 1  . fluconazole (DIFLUCAN) 150 MG tablet Take 1 tablet po once. May repeat dose in 3 days as needed for persistent symptoms. (Patient not taking: Reported on 06/06/2018) 3 tablet 0  . HYDROcodone-acetaminophen (NORCO) 5-325 MG tablet Take 1-2 tablets by mouth every 6 (six) hours as needed for moderate pain. (Patient not taking: Reported on 06/06/2018) 30 tablet 0  No current facility-administered medications for this visit.     Past Medical History:  Diagnosis Date  . Cholecystitis, chronic   . Cholelithiasis   . COPD (chronic obstructive pulmonary disease) ([place])   . Diabetes mellitus without complication ([place])   . Dyspnea   . Fatty liver 2008  . Fracture of clavicle 1992   left   . Fracture of ribs, multiple 1992   3 ribs  . GERD (gastroesophageal reflux disease)   . Hyperlipidemia   . Hypertension   . Paroxysmal SVT (supraventricular tachycardia) ([place])   . Pelvis fracture ([place]) 1992  . Pleurisy   . Pleurisy   . Seasonal allergies     Past  Surgical History:  Procedure Laterality Date  . AMPUTATION TOE Left 02/25/2017   Procedure: AMPUTATION TOE-LEFT 2ND MPJ;  Surgeon: Samara Deist, DPM;  Location: [facility] ORS;  Service: Podiatry;  Laterality: Left;  . BACK SURGERY  2008  . CHOLECYSTECTOMY    . COLONOSCOPY WITH PROPOFOL N/A 08/15/2017   Procedure: COLONOSCOPY WITH PROPOFOL;  Surgeon: Lollie Sails, MD;  Location: [facility] ENDOSCOPY;  Service: Endoscopy;  Laterality: N/A;  . ENDARTERECTOMY Right 05/12/2017   Procedure: ENDARTERECTOMY CAROTID;  Surgeon: [name], MD;  Location: [facility] ORS;  Service: Vascular;  Laterality: Right;  . ERCP    . FOOT SURGERY Right    x 3    Social History        Tobacco Use  . Smoking status: Former Smoker    Last attempt to quit: 02/24/1997    Years since quitting: 20.5  . Smokeless tobacco: Never Used  Substance Use Topics  . Alcohol use: No  . Drug use: No     Family History      Family History  Problem Relation Age of Onset  . Diabetes Sister           Allergies  Allergen Reactions  . Ampicillin   . Bactrim [Sulfamethoxazole-Trimethoprim] Other (See Comments)    Mouth sores Sores develop in mouth  . Amoxicillin Rash    Sores in my mouth  . Penicillins Rash    Rash in and around the mouth Has patient had a PCN reaction causing immediate rash, facial/tongue/throat swelling, SOB or lightheadedness with hypotension: Yes Has patient had a PCN reaction causing severe rash involving mucus membranes or skin necrosis: Yes Has patient had a PCN reaction that required hospitalization: No Has patient had a PCN reaction occurring within the last 10 years: Yes If all of the above answers are "NO", then may proceed with Cephalosporin use.       REVIEW OF SYSTEMS(Negative unless checked)  Constitutional: [] Weight loss[] Fever[] Chills Cardiac:[] Chest pain[] Chest pressure[x] Palpitations [] Shortness of breath when laying flat [] Shortness of  breath at rest [] Shortness of breath with exertion. Vascular: [] Pain in legs with walking[] Pain in legsat rest[] Pain in legs when laying flat [] Claudication [] Pain in feet when walking [] Pain in feet at rest [] Pain in feet when laying flat [] History of DVT [] Phlebitis [] Swelling in legs [] Varicose veins [x] Non-healing ulcers Pulmonary: [] Uses home oxygen [] Productive cough[] Hemoptysis [] Wheeze [] COPD [] Asthma Neurologic: [] Dizziness [] Blackouts [] Seizures [] History of stroke [] History of TIA[] Aphasia [] Temporary blindness[] Dysphagia [] Weaknessor numbness in arms [x] Weakness or numbnessin legs Musculoskeletal: [x] Arthritis [] Joint swelling [] Joint pain [] Low back pain Hematologic:[] Easy bruising[] Easy bleeding [] Hypercoagulable state [] Anemic [] Hepatitis Gastrointestinal:[] Blood in stool[] Vomiting blood[] Gastroesophageal reflux/heartburn[] Difficulty swallowing. Genitourinary: [] Chronic kidney disease [] Difficulturination [] Frequenturination [] Burning with urination[] Blood in urine Skin: [] Rashes [x] Ulcers [x] Wounds Psychological: [] History of anxiety[] History of major depression.    Physical Examination  Vitals:  06/27/18 1448 06/27/18 1449  BP: 121/71 90/64  Pulse: 71   Resp: 16   Weight: 175 lb (79.4 kg)   Height: 5\' 10"  (1.778 m)    Body mass index is 25.11 kg/m. Gen:  WD/WN, NAD Head: [place]/AT, No temporalis wasting. Ear/Nose/Throat: Hearing grossly intact, nares w/o erythema or drainage, trachea midline Eyes: Conjunctiva clear. Sclera non-icteric Neck: Supple.  No bruit  Pulmonary:  Good air movement, equal and clear to auscultation bilaterally.  Cardiac: RRR, No JVD Vascular:  Vessel Right Left  Radial Palpable Palpable                          PT 1+ Palpable 1+ Palpable  DP Palpable 1+ Palpable    Musculoskeletal: M/S 5/5 throughout.  No deformity or atrophy. No  edema. Neurologic: CN 2-12 intact. Sensation grossly intact in extremities.  Symmetrical.  Speech is fluent. Motor exam as listed above. Psychiatric: Judgment intact, Mood & affect appropriate for pt's clinical situation. Dermatologic: No rashes or ulcers noted.  No cellulitis or open wounds.      CBC Lab Results  Component Value Date   WBC 6.1 05/13/2017   HGB 13.0 05/13/2017   HCT 37.0 (L) 05/13/2017   MCV 86.8 05/13/2017   PLT 119 (L) 05/13/2017    BMET    Component Value Date/Time   NA 136 05/13/2017 0339   NA 136 04/17/2014 1026   K 4.8 05/13/2017 0339   K 5.1 04/17/2014 1026   CL 105 05/13/2017 0339   CL 103 04/17/2014 1026   CO2 24 05/13/2017 0339   CO2 28 04/17/2014 1026   GLUCOSE 230 (H) 05/13/2017 0339   GLUCOSE 124 (H) 04/17/2014 1026   BUN 21 (H) 05/13/2017 0339   BUN 28 (H) 04/17/2014 1026   CREATININE 1.03 05/13/2017 0339   CREATININE 1.30 04/17/2014 1026   CALCIUM 7.8 (L) 05/13/2017 0339   CALCIUM 8.6 04/17/2014 1026   GFRNONAA >60 05/13/2017 0339   GFRNONAA 59 (L) 04/17/2014 1026   GFRAA >60 05/13/2017 0339   GFRAA >60 04/17/2014 1026   CrCl cannot be calculated (Patient's most recent lab result is older than the maximum 21 days allowed.).  COAG Lab Results  Component Value Date   INR 0.97 05/05/2017    Radiology No results found.   Assessment/Plan Hyperlipidemia lipid control important in reducing the progression of atherosclerotic disease. Continue statin therapy   Type 2 diabetes mellitus with complication (HCC) blood glucose control important in reducing the progression of atherosclerotic disease. Also, involved in wound healing. On appropriate medications.  PAD (peripheral artery disease) (HCC) His ABIs today are 0.92 on the right and 0.71 on the left.  These are stable from previous studies. No limb threatening or lifestyle limiting symptoms.  Continue aspirin and Plavix.  Recheck in 1 year  Bilateral carotid artery  stenosis Stable 40 to 59% carotid artery stenosis bilaterally.  Continue dual antiplatelet therapy.  Continue statin agent.  Recheck in 1 year    Leotis Pain, MD  06/27/2018 3:40 PM    This note was created with Dragon medical transcription system.  Any errors from dictation are purely unintentional

## 2018-06-27 NOTE — Assessment & Plan Note (Signed)
His ABIs today are 0.92 on the right and 0.71 on the left.  These are stable from previous studies. No limb threatening or lifestyle limiting symptoms.  Continue aspirin and Plavix.  Recheck in 1 year

## 2018-06-27 NOTE — Assessment & Plan Note (Signed)
Stable 40 to 59% carotid artery stenosis bilaterally.  Continue dual antiplatelet therapy.  Continue statin agent.  Recheck in 1 year

## 2018-07-04 ENCOUNTER — Other Ambulatory Visit: Payer: Self-pay

## 2018-07-04 MED ORDER — GABAPENTIN 100 MG PO CAPS: ORAL_CAPSULE | ORAL | 1 refills | Status: DC

## 2018-08-03 ENCOUNTER — Encounter: Payer: Self-pay | Admitting: Adult Health

## 2018-08-03 ENCOUNTER — Ambulatory Visit (INDEPENDENT_AMBULATORY_CARE_PROVIDER_SITE_OTHER): Payer: Medicare Other | Admitting: Adult Health

## 2018-08-03 VITALS — BP 143/114 | HR 63 | Temp 97.2°F | Resp 16 | Ht 70.0 in | Wt 179.0 lb

## 2018-08-03 DIAGNOSIS — Z7722 Contact with and (suspected) exposure to environmental tobacco smoke (acute) (chronic): Secondary | ICD-10-CM | POA: Diagnosis not present

## 2018-08-03 DIAGNOSIS — R059 Cough, unspecified: Secondary | ICD-10-CM

## 2018-08-03 DIAGNOSIS — R05 Cough: Secondary | ICD-10-CM | POA: Diagnosis not present

## 2018-08-03 DIAGNOSIS — J069 Acute upper respiratory infection, unspecified: Secondary | ICD-10-CM | POA: Diagnosis not present

## 2018-08-03 DIAGNOSIS — I10 Essential (primary) hypertension: Secondary | ICD-10-CM | POA: Diagnosis not present

## 2018-08-03 MED ORDER — AZITHROMYCIN 250 MG PO TABS: ORAL_TABLET | ORAL | 0 refills | Status: DC

## 2018-08-03 NOTE — Progress Notes (Addendum)
[facility] [place], [place] [address]  Internal MEDICINE  Office Visit Note  Patient Name: [name]  [id number]  [id number]  Date of Service: 08/03/2018  Chief Complaint  Patient presents with  . Sinusitis  . Cough  . Sore Throat     HPI Pt is here for a sick visit. He reports cough, sore throat, and chest congestion for 4 days.  He reports voice change and has felt feverish. He states he has been taking Mucinex without relief.  He reports his cough seems to be getting worse and states that this happens to him once or twice a year he typically gets azithromycin and it makes it go away very quickly.  He denies any overt sinus drainage or pain or pressure in his sinuses.     Current Medication:  Outpatient Encounter Medications as of 08/03/2018  Medication Sig  . aspirin EC 81 MG tablet Take 81 mg by mouth daily.  . budesonide-formoterol (SYMBICORT) 160-4.5 MCG/ACT inhaler Inhale 2 puffs into the lungs 2 (two) times daily.  . Calcium Carbonate-Vitamin D (CALCIUM PLUS VITAMIN D PO) Take 1 tablet by mouth daily.  . clopidogrel (PLAVIX) 75 MG tablet Take 1 tablet (75 mg total) by mouth daily with breakfast.  . diphenhydrAMINE (BENADRYL) 25 mg capsule Take 25 mg by mouth every 6 (six) hours as needed for itching or allergies.   . famotidine (PEPCID) 20 MG tablet Take 20 mg by mouth daily as needed for heartburn or indigestion.  . gabapentin (NEURONTIN) 100 MG capsule TAKE 2 CAPSULES BY MOUTH UP TO THREE TIMES DAILY  . glyBURIDE (DIABETA) 2.5 MG tablet Take 1 tablet (2.5 mg total) by mouth 2 (two) times daily with a meal.  . ipratropium (ATROVENT HFA) 17 MCG/ACT inhaler Inhale 2 puffs into the lungs every 4 (four) hours as needed for wheezing.  Marland Kitchen lisinopril (PRINIVIL,ZESTRIL) 5 MG tablet TAKE 1 TABLET BY MOUTH ONCE DAILY FOR BLOOD PRESSURE.  . lovastatin (MEVACOR) 10 MG tablet Take 1 tab  Po QHS  . metoprolol succinate (TOPROL-XL) 100 MG 24 hr tablet  Take 1 tablet (100 mg total) by mouth daily. Take with or immediately following a meal. (Patient taking differently: Take 100 mg by mouth daily. In am. Take with or immediately following a meal.)  . mometasone-formoterol (DULERA) 100-5 MCG/ACT AERO Inhale 2 puffs into the lungs 2 (two) times daily.  . Omega-3 Fatty Acids (FISH OIL PO) Take 1 capsule by mouth daily.  . polyethylene glycol-electrolytes (NULYTELY/GOLYTELY) 420 g solution Take 4,000 mLs once by mouth.  . traMADol (ULTRAM) 50 MG tablet Take 1 tablet (50 mg total) by mouth 2 (two) times daily as needed for moderate pain or severe pain.  . vitamin B-12 (CYANOCOBALAMIN) 1000 MCG tablet Take 1,000 mcg by mouth daily.  Marland Kitchen azithromycin (ZITHROMAX) 250 MG tablet Take 2 tabs PO on Day 1 and 1 tab PO on days 2-5.  . [DISCONTINUED] Diphenhyd-Hydrocort-Nystatin (FIRST-DUKES MOUTHWASH) SUSP Swish and swallow with 44mls QID prn sore throat. (Patient not taking: Reported on 08/03/2018)  . [DISCONTINUED] fluconazole (DIFLUCAN) 150 MG tablet Take 1 tablet po once. May repeat dose in 3 days as needed for persistent symptoms. (Patient not taking: Reported on 08/03/2018)  . [DISCONTINUED] HYDROcodone-acetaminophen (NORCO) 5-325 MG tablet Take 1-2 tablets by mouth every 6 (six) hours as needed for moderate pain. (Patient not taking: Reported on 08/03/2018)   No facility-administered encounter medications on file as of 08/03/2018.  Medical History: Past Medical History:  Diagnosis Date  . Cholecystitis, chronic   . Cholelithiasis   . COPD (chronic obstructive pulmonary disease) ([place])   . Diabetes mellitus without complication ([place])   . Dyspnea   . Fatty liver 2008  . Fracture of clavicle 1992   left   . Fracture of ribs, multiple 1992   3 ribs  . GERD (gastroesophageal reflux disease)   . Hyperlipidemia   . Hypertension   . Paroxysmal SVT (supraventricular tachycardia) ([place])   . Pelvis fracture ([place]) 1992  . Pleurisy   . Pleurisy   .  Seasonal allergies      Vital Signs: BP (!) 143/114 (BP Location: Left Arm, Patient Position: Sitting, Cuff Size: Small)   Pulse 63   Temp (!) 97.2 F (36.2 C) (Oral)   Resp 16   Ht 5\' 10"  (1.778 m)   Wt 179 lb (81.2 kg)   SpO2 94%   BMI 25.68 kg/m    Review of Systems  Constitutional: Negative.  Negative for chills, fatigue and unexpected weight change.  HENT: Negative.  Negative for congestion, rhinorrhea, sneezing and sore throat.   Eyes: Negative for redness.  Respiratory: Negative.  Negative for cough, chest tightness and shortness of breath.   Cardiovascular: Negative.  Negative for chest pain and palpitations.  Gastrointestinal: Negative.  Negative for abdominal pain, constipation, diarrhea, nausea and vomiting.  Endocrine: Negative.   Genitourinary: Negative.  Negative for dysuria and frequency.  Musculoskeletal: Negative.  Negative for arthralgias, back pain, joint swelling and neck pain.  Skin: Negative.  Negative for rash.  Allergic/Immunologic: Negative.   Neurological: Negative.  Negative for tremors and numbness.  Hematological: Negative for adenopathy. Does not bruise/bleed easily.  Psychiatric/Behavioral: Negative.  Negative for behavioral problems, sleep disturbance and suicidal ideas. The patient is not nervous/anxious.     Physical Exam  Constitutional: He is oriented to person, place, and time. He appears well-developed and well-nourished. No distress.  HENT:  Head: Normocephalic and atraumatic.  Mouth/Throat: Oropharynx is clear and moist. No oropharyngeal exudate.  Eyes: Pupils are equal, round, and reactive to light. EOM are normal.  Neck: Normal range of motion. Neck supple. No JVD present. No tracheal deviation present. No thyromegaly present.  Cardiovascular: Normal rate, regular rhythm and normal heart sounds. Exam reveals no gallop and no friction rub.  No murmur heard. Pulmonary/Chest: Effort normal and breath sounds normal. No respiratory  distress. He has no wheezes. He has no rales. He exhibits no tenderness.  Abdominal: Soft. There is no tenderness. There is no guarding.  Musculoskeletal: Normal range of motion.  Lymphadenopathy:    He has no cervical adenopathy.  Neurological: He is alert and oriented to person, place, and time. No cranial nerve deficit.  Skin: Skin is warm and dry. He is not diaphoretic.  Psychiatric: He has a normal mood and affect. His behavior is normal. Judgment and thought content normal.  Nursing note and vitals reviewed.  Assessment/Plan: 1. Upper respiratory tract infection, unspecified type Z-Pak sent to pharmacy.  Patient instructed to return to clinic if symptoms do not improve in the next 7 to 10 days. - azithromycin (ZITHROMAX) 250 MG tablet; Take 2 tabs PO on Day 1 and 1 tab PO on days 2-5.  Dispense: 6 tablet; Refill: 0  2. Cough Patient reports mild cough.  Encouraged him to take over-the-counter cough medicine if needed.  3. Second hand smoke exposure Patient no longer smokes has been stop smoking for many many years  however his wife continues to smoke in their home.  So he is exposed to a lot of secondhand smoke.  We discussed this as a danger to his breathing and he verbalized understanding.  4. Essential hypertension Patient blood pressure initially elevated.  Recheck by NP in room new blood pressure 146/90.  We will continue to monitor in the future.  General Counseling: Pablo verbalizes understanding of the findings of todays visit and agrees with plan of treatment. I have discussed any further diagnostic evaluation that may be needed or ordered today. We also reviewed his medications today. he has been encouraged to call the office with any questions or concerns that should arise related to todays visit.   No orders of the defined types were placed in this encounter.   Meds ordered this encounter  Medications  . azithromycin (ZITHROMAX) 250 MG tablet    Sig: Take 2 tabs PO  on Day 1 and 1 tab PO on days 2-5.    Dispense:  6 tablet    Refill:  0    Time spent: 25 Minutes  This patient was seen by Orson Gear AGNP-C in Collaboration with Dr [name] as a part of collaborative care agreement.  Kendell Bane AGNP-C Internal Medicine

## 2018-08-03 NOTE — Patient Instructions (Signed)
Upper Respiratory Infection, Adult Most upper respiratory infections (URIs) are caused by a virus. A URI affects the nose, throat, and upper air passages. The most common type of URI is often called "the common cold." Follow these instructions at home:  Take medicines only as told by your doctor.  Gargle warm saltwater or take cough drops to comfort your throat as told by your doctor.  Use a warm mist humidifier or inhale steam from a shower to increase air moisture. This may make it easier to breathe.  Drink enough fluid to keep your pee (urine) clear or pale yellow.  Eat soups and other clear broths.  Have a healthy diet.  Rest as needed.  Go back to work when your fever is gone or your doctor says it is okay. ? You may need to stay home longer to avoid giving your URI to others. ? You can also wear a face mask and wash your hands often to prevent spread of the virus.  Use your inhaler more if you have asthma.  Do not use any tobacco products, including cigarettes, chewing tobacco, or electronic cigarettes. If you need help quitting, ask your doctor. Contact a doctor if:  You are getting worse, not better.  Your symptoms are not helped by medicine.  You have chills.  You are getting more short of breath.  You have brown or red mucus.  You have yellow or brown discharge from your nose.  You have pain in your face, especially when you bend forward.  You have a fever.  You have puffy (swollen) neck glands.  You have pain while swallowing.  You have white areas in the back of your throat. Get help right away if:  You have very bad or constant: ? Headache. ? Ear pain. ? Pain in your forehead, behind your eyes, and over your cheekbones (sinus pain). ? Chest pain.  You have long-lasting (chronic) lung disease and any of the following: ? Wheezing. ? Long-lasting cough. ? Coughing up blood. ? A change in your usual mucus.  You have a stiff neck.  You have  changes in your: ? Vision. ? Hearing. ? Thinking. ? Mood. This information is not intended to replace advice given to you by your health care provider. Make sure you discuss any questions you have with your health care provider. Document Released: 03/01/2008 Document Revised: 05/16/2016 Document Reviewed: 12/19/2013 Elsevier Interactive Patient Education  2018 [employer]  

## 2018-08-21 ENCOUNTER — Other Ambulatory Visit (INDEPENDENT_AMBULATORY_CARE_PROVIDER_SITE_OTHER): Payer: Self-pay | Admitting: Vascular Surgery

## 2018-08-29 DIAGNOSIS — E113212 Type 2 diabetes mellitus with mild nonproliferative diabetic retinopathy with macular edema, left eye: Secondary | ICD-10-CM | POA: Diagnosis not present

## 2018-09-18 DIAGNOSIS — B351 Tinea unguium: Secondary | ICD-10-CM | POA: Diagnosis not present

## 2018-09-18 DIAGNOSIS — E1142 Type 2 diabetes mellitus with diabetic polyneuropathy: Secondary | ICD-10-CM | POA: Diagnosis not present

## 2018-09-29 ENCOUNTER — Ambulatory Visit
Admission: EM | Admit: 2018-09-29 | Discharge: 2018-09-29 | Disposition: A | Discharge status: Home / Self Care | Payer: Medicare Other | Attending: Physician Assistant | Admitting: Physician Assistant

## 2018-09-29 ENCOUNTER — Other Ambulatory Visit: Payer: Self-pay

## 2018-09-29 ENCOUNTER — Ambulatory Visit (INDEPENDENT_AMBULATORY_CARE_PROVIDER_SITE_OTHER): Payer: Medicare Other

## 2018-09-29 ENCOUNTER — Telehealth: Payer: Self-pay

## 2018-09-29 DIAGNOSIS — R05 Cough: Secondary | ICD-10-CM | POA: Insufficient documentation

## 2018-09-29 DIAGNOSIS — J449 Chronic obstructive pulmonary disease, unspecified: Secondary | ICD-10-CM | POA: Diagnosis not present

## 2018-09-29 DIAGNOSIS — R059 Cough, unspecified: Secondary | ICD-10-CM

## 2018-09-29 IMAGING — CR DG CHEST 2V
2 series · 3 of 3 positions shown · non-contrast
Comparison: [DATE]

CLINICAL DATA: Cough and shortness of Breath

EXAM:
CHEST - 2 VIEW

[Series 1: chest pa · 0.14mm/px · 2 of 2 slices shown]
[im 1/2]
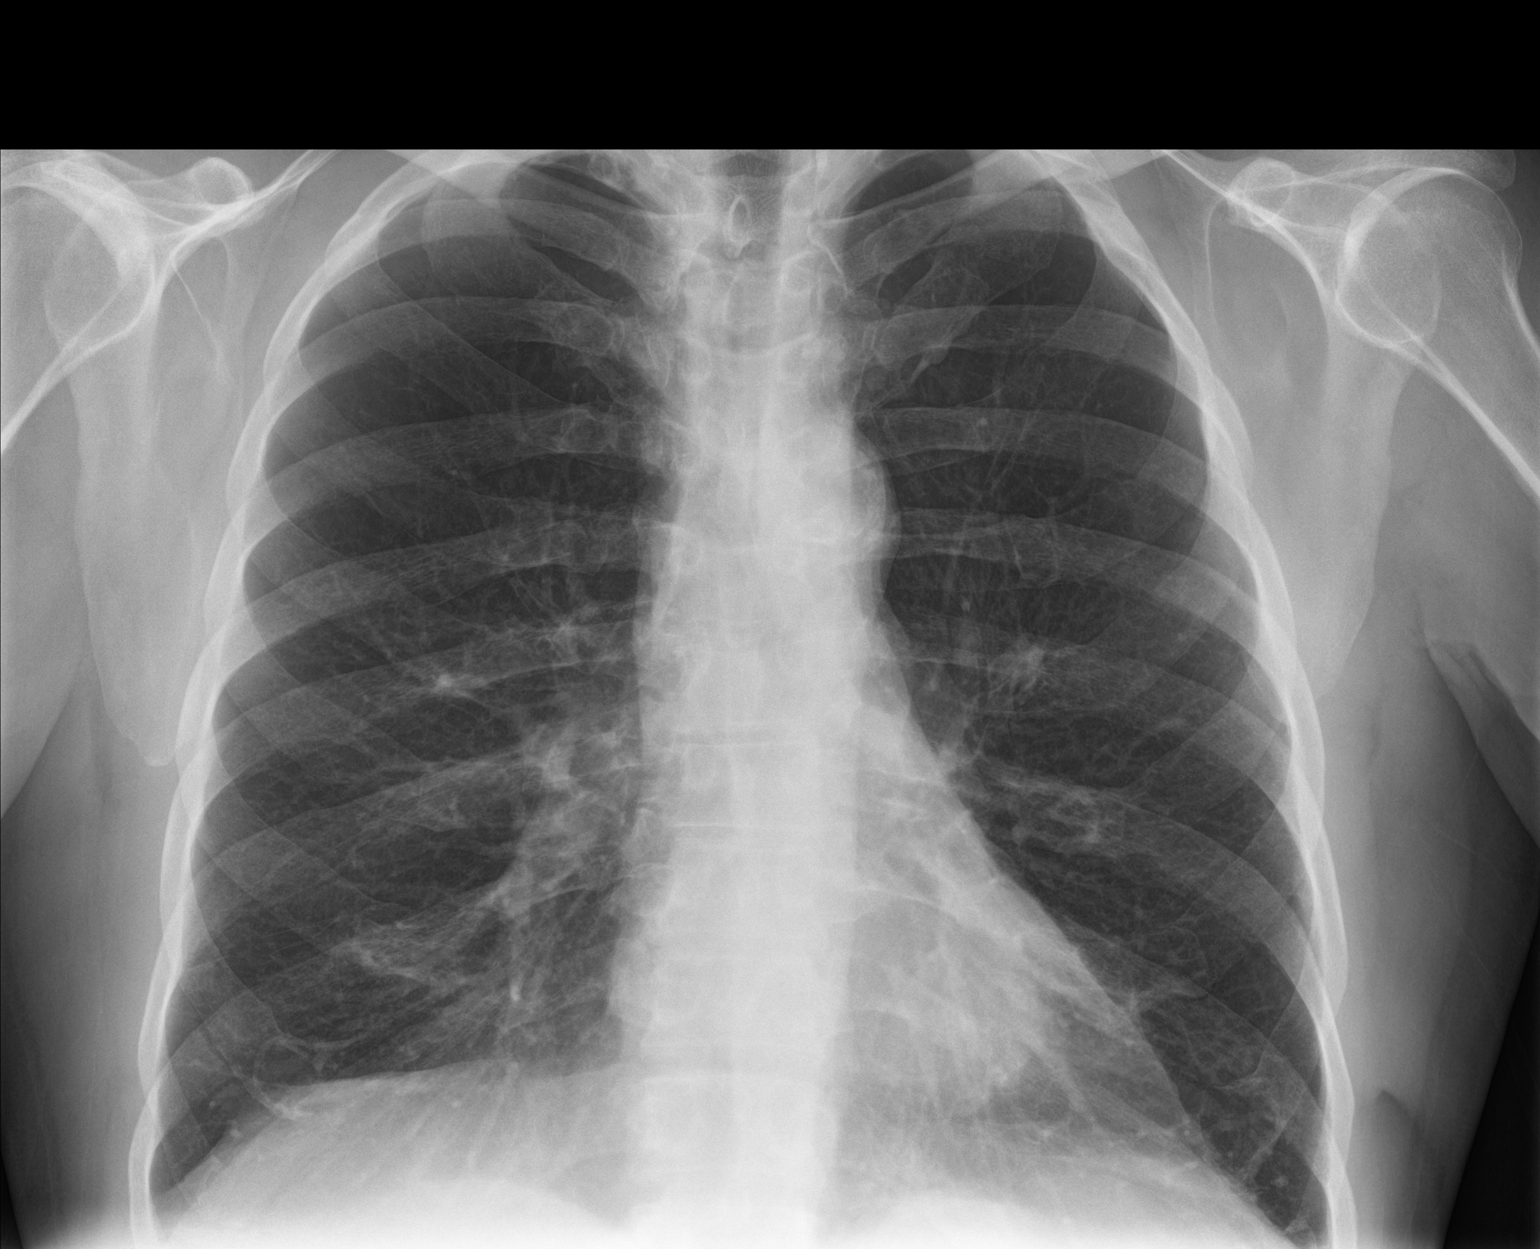
[im 2/2]
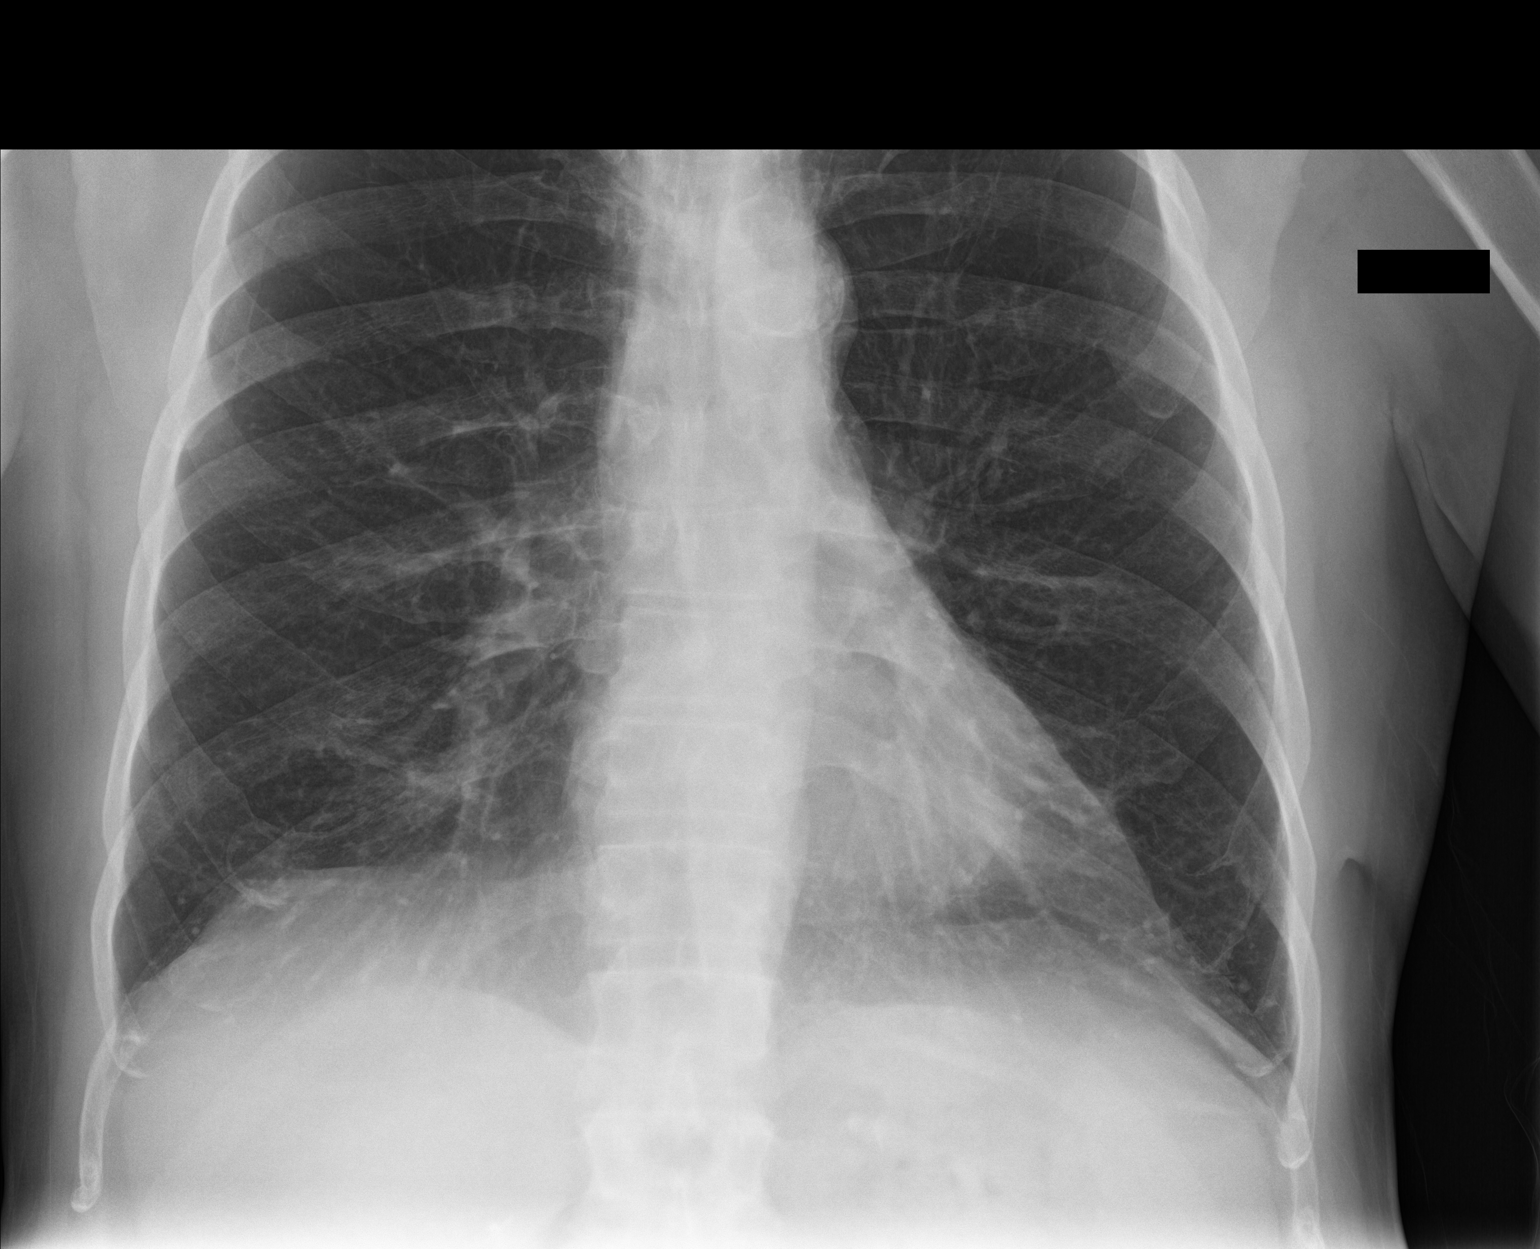

[chest lat]
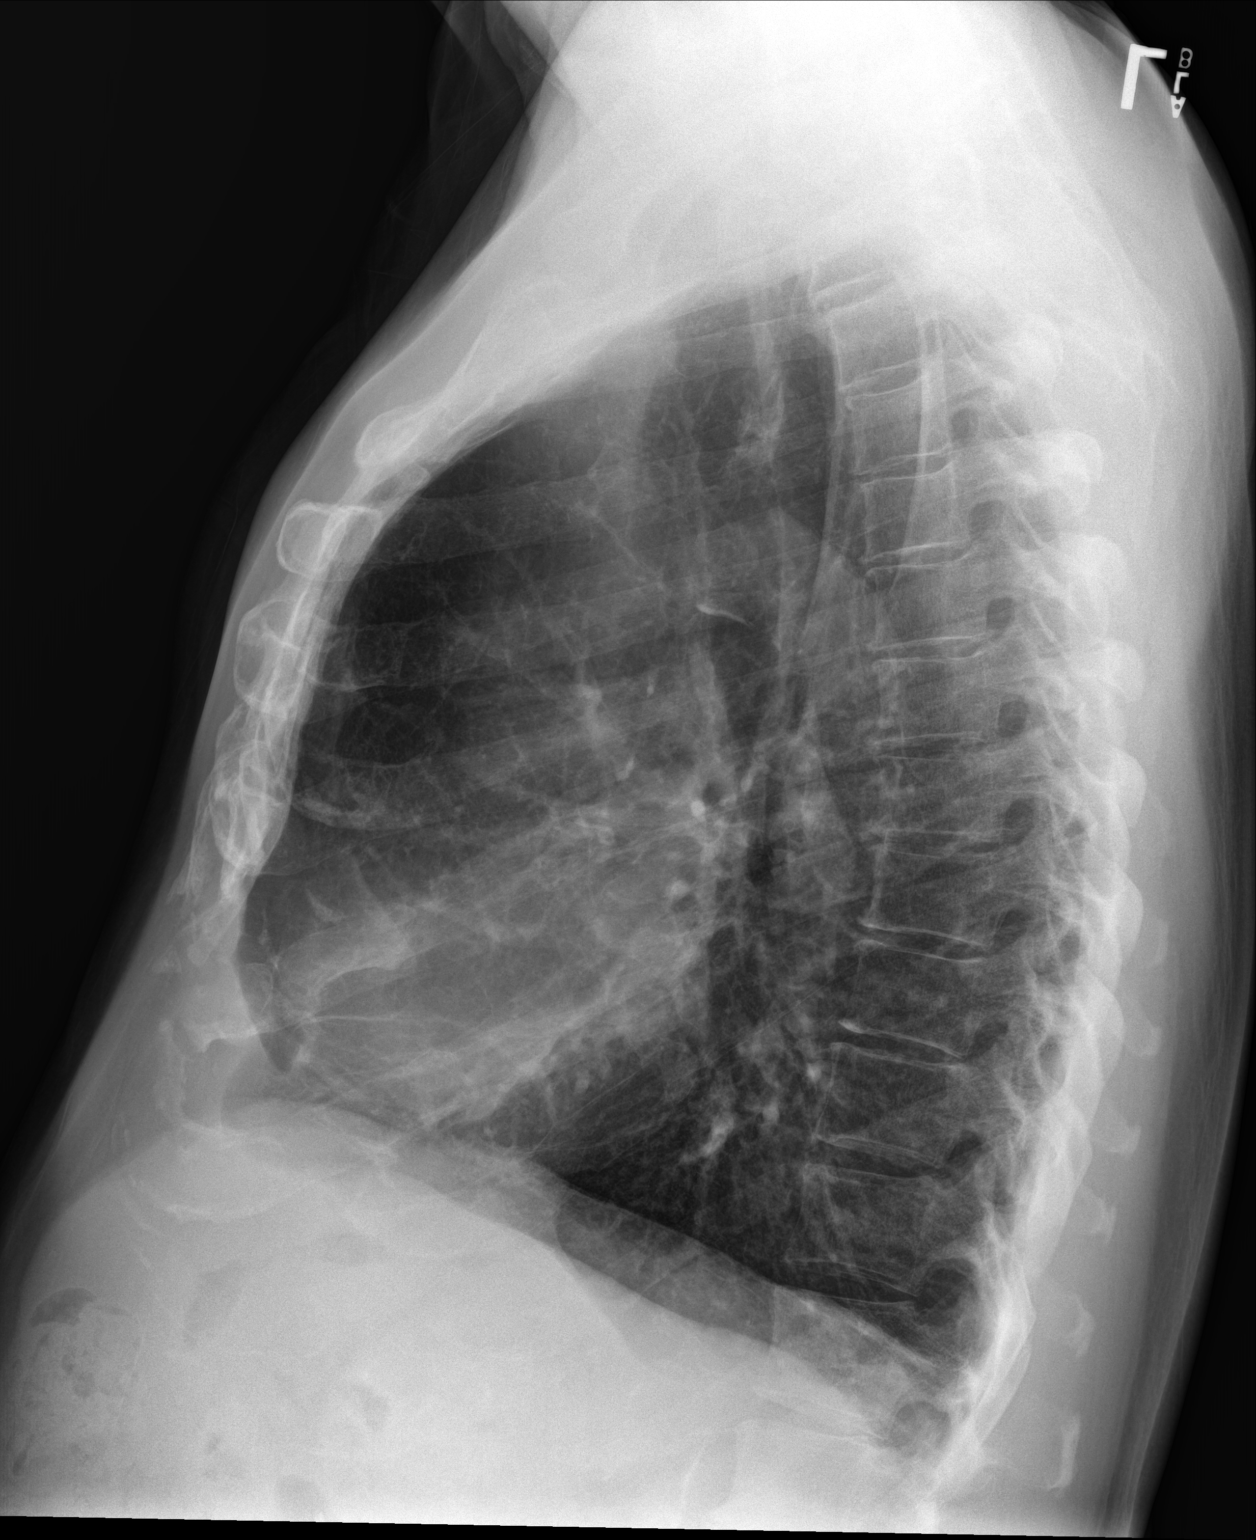

[3 of 3 positions shown; findings below may reference images not displayed]

FINDINGS: Cardiac shadows within normal limits. Aortic calcifications are
again noted. Lungs are hyperinflated consistent with COPD. No focal
infiltrate or sizable effusion is noted. No bony abnormality is
noted.
IMPRESSION: COPD without acute abnormality.

## 2018-09-29 MED ORDER — AZITHROMYCIN 250 MG PO TABS: ORAL_TABLET | ORAL | 0 refills | Status: DC

## 2018-09-29 MED ORDER — GUAIFENESIN-DM 100-10 MG/5ML PO SYRP: 5.0000 mL | ORAL_SOLUTION | ORAL | 0 refills | Status: DC | PRN

## 2018-09-29 NOTE — Telephone Encounter (Signed)
Pt called having bad sinus infection and coughing advised to go to walk in clinic

## 2018-09-29 NOTE — Discharge Instructions (Addendum)
-  Azithromycin: Take 2 tablets by mouth the first day followed by one tablet daily for next 4 days. -Robitussin DM: every 4 hours as needed for cough -Ibuprofen or acetaminophen as needed for pain or fever -Fluids -Follow-up with primary care provider if symptoms worsen or do not improve.

## 2018-09-29 NOTE — ED Triage Notes (Signed)
Patient complains of cough, congestion, sinus pain and pressure x 1 week. States that he has been consistently worsening.

## 2018-09-29 NOTE — ED Provider Notes (Signed)
[facility]    CSN: [id number] Arrival date & time: 09/29/18  1112     History   Chief Complaint Chief Complaint  Patient presents with  . Cough    HPI [name] is a 69 y.o. male.   Patient is a 68 year old male with past medical history of COPD, remote smoking, diabetes, GERD, hyperlipidemia, hypertension, paroxysmal SVT, and seasonal allergies who presents with complaint of cough for about 1 week with production of brownish/green mucus.  Patient states he has been taking Mucinex.  Patient states he uses no oxygen at home.  He does report some shortness of breath when he has a coughing fit or physical exertion.  Patient denies any nausea, vomiting, abdominal pain, or chest pain.  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  He states he is been there for 31 years but not in the area that would have lint floating in the air.  Patient had similar symptoms back in November and was seen by primary care.  Patient states he usually gets similar symptoms like this about once a year it is unusual him to get it more than once.  He was given azithromycin pill pack which improved his symptoms.     Past Medical History:  Diagnosis Date  . Cholecystitis, chronic   . Cholelithiasis   . COPD (chronic obstructive pulmonary disease) ([place])   . Diabetes mellitus without complication ([place])   . Dyspnea   . Fatty liver 2008  . Fracture of clavicle 1992   left   . Fracture of ribs, multiple 1992   3 ribs  . GERD (gastroesophageal reflux disease)   . Hyperlipidemia   . Hypertension   . Paroxysmal SVT (supraventricular tachycardia) ([place])   . Pelvis fracture ([place]) 1992  . Pleurisy   . Pleurisy   . Seasonal allergies     Patient Active Problem List   Diagnosis Date Noted  . Sore throat 04/03/2018  . Oral candidiasis 04/03/2018  . Uncontrolled type 2 diabetes mellitus with hyperglycemia ([place]) 03/06/2018  . Acute non-recurrent pansinusitis 03/06/2018  .  Primary osteoarthritis of shoulders, bilateral 03/06/2018  . Essential hypertension 11/03/2017  . GERD (gastroesophageal reflux disease) 11/03/2017  . Hyperlipidemia 09/13/2017  . Carotid stenosis, right 05/12/2017  . PAD (peripheral artery disease) ([place]) 03/15/2017  . Bilateral carotid artery stenosis 03/15/2017  . Type 2 diabetes mellitus with complication ([place]) 62/22/9798  . SVT (supraventricular tachycardia) ([place]) 07/10/2015    Past Surgical History:  Procedure Laterality Date  . AMPUTATION TOE Left 02/25/2017   Procedure: AMPUTATION TOE-LEFT 2ND MPJ;  Surgeon: Samara Deist, DPM;  Location: [facility] ORS;  Service: Podiatry;  Laterality: Left;  . BACK SURGERY  2008  . CHOLECYSTECTOMY    . COLONOSCOPY WITH PROPOFOL N/A 08/15/2017   Procedure: COLONOSCOPY WITH PROPOFOL;  Surgeon: Lollie Sails, MD;  Location: [facility] ENDOSCOPY;  Service: Endoscopy;  Laterality: N/A;  . ENDARTERECTOMY Right 05/12/2017   Procedure: ENDARTERECTOMY CAROTID;  Surgeon: [name], MD;  Location: [facility] ORS;  Service: Vascular;  Laterality: Right;  . ERCP    . FOOT SURGERY Right    x 3       Home Medications    Prior to Admission medications   Medication Sig Start Date End Date Taking? Authorizing Provider  aspirin EC 81 MG tablet Take 81 mg by mouth daily.   Yes [provider]  budesonide-formoterol (SYMBICORT) 160-4.5 MCG/ACT inhaler Inhale 2 puffs into the lungs 2 (two)  times daily.   Yes [provider]  Calcium Carbonate-Vitamin D (CALCIUM PLUS VITAMIN D PO) Take 1 tablet by mouth daily.   Yes [provider]  clopidogrel (PLAVIX) 75 MG tablet TAKE 1 TABLET BY MOUTH ONCE DAILY 08/21/18  Yes Dew, Erskine Squibb, MD  diphenhydrAMINE (BENADRYL) 25 mg capsule Take 25 mg by mouth every 6 (six) hours as needed for itching or allergies.    Yes [provider]  famotidine (PEPCID) 20 MG tablet Take 20 mg by mouth daily as needed for heartburn or indigestion.   Yes [provider]  gabapentin (NEURONTIN) 100 MG capsule TAKE 2 CAPSULES BY MOUTH UP TO THREE TIMES DAILY 07/04/18  Yes Boscia, Greer Ee, NP  glyBURIDE (DIABETA) 2.5 MG tablet Take 1 tablet (2.5 mg total) by mouth 2 (two) times daily with a meal. 03/06/18  Yes Boscia, Heather E, NP  ipratropium (ATROVENT HFA) 17 MCG/ACT inhaler Inhale 2 puffs into the lungs every 4 (four) hours as needed for wheezing.   Yes [provider]  lisinopril (PRINIVIL,ZESTRIL) 5 MG tablet TAKE 1 TABLET BY MOUTH ONCE DAILY FOR BLOOD PRESSURE. 04/25/18  Yes Boscia, Heather E, NP  lovastatin (MEVACOR) 10 MG tablet Take 1 tab  Po QHS 05/22/18  Yes Boscia, Heather E, NP  metoprolol succinate (TOPROL-XL) 100 MG 24 hr tablet Take 1 tablet (100 mg total) by mouth daily. Take with or immediately following a meal. Patient taking differently: Take 100 mg by mouth daily. In am. Take with or immediately following a meal. 07/12/15  Yes Wieting, Richard, MD  mometasone-formoterol (DULERA) 100-5 MCG/ACT AERO Inhale 2 puffs into the lungs 2 (two) times daily.   Yes [provider]  Omega-3 Fatty Acids (FISH OIL PO) Take 1 capsule by mouth daily.   Yes [provider]  polyethylene glycol-electrolytes (NULYTELY/GOLYTELY) 420 g solution Take 4,000 mLs once by mouth.   Yes [provider]  traMADol (ULTRAM) 50 MG tablet Take 1 tablet (50 mg total) by mouth 2 (two) times daily as needed for moderate pain or severe pain. 06/05/18  Yes Boscia, Greer Ee, NP  vitamin B-12 (CYANOCOBALAMIN) 1000 MCG tablet Take 1,000 mcg by mouth daily.   Yes [provider]  azithromycin (ZITHROMAX Z-PAK) 250 MG tablet Take 2 tablets by mouth the first day followed by one tablet daily for next 4 days. 09/29/18   Luvenia Redden, PA-C  guaiFENesin-dextromethorphan (ROBITUSSIN DM) 100-10 MG/5ML syrup Take 5 mLs by mouth every 4 (four) hours as needed for cough. 09/29/18   Luvenia Redden, PA-C    Family History Family History    Problem Relation Age of Onset  . Diabetes Sister     Social History Social History   Tobacco Use  . Smoking status: Former Smoker    Last attempt to quit: 02/24/1997    Years since quitting: 21.6  . Smokeless tobacco: Never Used  Substance Use Topics  . Alcohol use: No  . Drug use: No     Allergies   Ampicillin; Bactrim [sulfamethoxazole-trimethoprim]; Amoxicillin; and Penicillins   Review of Systems Review of Systems As noted above in HPI.  Other systems reviewed and found to be negative  Physical Exam Triage Vital Signs ED Triage Vitals  Enc Vitals Group     BP 09/29/18 1131 115/76     Pulse Rate 09/29/18 1131 70     Resp 09/29/18 1131 18     Temp 09/29/18 1131 97.9 F (36.6 C)  Temp Source 09/29/18 1131 Oral     SpO2 09/29/18 1131 94 %     Weight 09/29/18 1129 175 lb (79.4 kg)     Height 09/29/18 1129 5\' 11"  (1.803 m)     Head Circumference --      Peak Flow --      Pain Score 09/29/18 1129 0     Pain Loc --      Pain Edu? --      Excl. in [place]? --    No data found.  Updated Vital Signs BP 115/76 (BP Location: Right Arm)   Pulse 70   Temp 97.9 F (36.6 C) (Oral)   Resp 18   Ht 5\' 11"  (1.803 m)   Wt 175 lb (79.4 kg)   SpO2 94%   BMI 24.41 kg/m   Visual Acuity Right Eye Distance:   Left Eye Distance:   Bilateral Distance:    Right Eye Near:   Left Eye Near:    Bilateral Near:     Physical Exam Constitutional:      Appearance: Normal appearance. He is not ill-appearing.  HENT:     Head: Normocephalic and atraumatic.     Right Ear: Tympanic membrane normal.     Left Ear: Tympanic membrane normal.     Nose: Nose normal.  Eyes:     Extraocular Movements: Extraocular movements intact.     Pupils: Pupils are equal, round, and reactive to light.  Neck:     Musculoskeletal: Normal range of motion and neck supple.  Cardiovascular:     Rate and Rhythm: Regular rhythm.     Heart sounds: No murmur. No gallop.   Pulmonary:     Effort:  Pulmonary effort is normal.     Breath sounds: No wheezing or rhonchi.     Comments: Diminished breath sounds, frequent cough. Lymphadenopathy:     Cervical: No cervical adenopathy.  Skin:    General: Skin is warm and dry.     Capillary Refill: Capillary refill takes less than 2 seconds.  Neurological:     General: No focal deficit present.     Mental Status: He is alert.  Psychiatric:        Mood and Affect: Mood normal.        Behavior: Behavior normal.      UC Treatments / Results  Labs (all labs ordered are listed, but only abnormal results are displayed) Labs Reviewed - No data to display  EKG None  Radiology Dg Chest 2 View  Result Date: 09/29/2018 CLINICAL DATA:  Cough and shortness of Breath EXAM: CHEST - 2 VIEW COMPARISON:  07/10/2015 FINDINGS: Cardiac shadows within normal limits. Aortic calcifications are again noted. Lungs are hyperinflated consistent with COPD. No focal infiltrate or sizable effusion is noted. No bony abnormality is noted. IMPRESSION: COPD without acute abnormality. Electronically Signed   By: Inez Catalina M.D.   On: 09/29/2018 12:18    Procedures Procedures (including critical care time)  Medications Ordered in UC Medications - No data to display  Initial Impression / Assessment and Plan / UC Course  I have reviewed the triage vital signs and the nursing notes.  Pertinent labs & imaging results that were available during my care of the patient were reviewed by me and considered in my medical decision making (see chart for details).     Patient with cough productive green/brown mucus for about 1 week.  Chest x-ray showing COPD but no acute abnormality.  Given patient's  history of COPD and currently using 2 inhaled steroids, will go ahead and treat him for pneumonia.  Give him prescription for azithromycin 5-day course.  I also give prescription for Robitussin-DM.  Have patient follow-up with his primary care provider should symptoms worsen or  not improve.  Patient verbalized understanding.  Final Clinical Impressions(s) / UC Diagnoses   Final diagnoses:  Cough     Discharge Instructions     -Azithromycin: Take 2 tablets by mouth the first day followed by one tablet daily for next 4 days. -Robitussin DM: every 4 hours as needed for cough -Ibuprofen or acetaminophen as needed for pain or fever -Fluids -Follow-up with primary care provider if symptoms worsen or do not improve.    ED Prescriptions    Medication Sig Dispense Auth. Provider   azithromycin (ZITHROMAX Z-PAK) 250 MG tablet Take 2 tablets by mouth the first day followed by one tablet daily for next 4 days. 6 tablet Luvenia Redden, PA-C   guaiFENesin-dextromethorphan (ROBITUSSIN DM) 100-10 MG/5ML syrup Take 5 mLs by mouth every 4 (four) hours as needed for cough. 118 mL Luvenia Redden, PA-C     Controlled Substance Prescriptions [place] Controlled Substance Registry consulted? Not Applicable   Luvenia Redden, PA-C 09/29/18 1224

## 2018-10-04 ENCOUNTER — Other Ambulatory Visit: Payer: Self-pay | Admitting: Nurse Practitioner

## 2018-10-04 DIAGNOSIS — M19011 Primary osteoarthritis, right shoulder: Secondary | ICD-10-CM

## 2018-10-04 DIAGNOSIS — M19012 Primary osteoarthritis, left shoulder: Principal | ICD-10-CM

## 2018-10-04 MED ORDER — TRAMADOL HCL 50 MG PO TABS: 50.0000 mg | ORAL_TABLET | Freq: Two times a day (BID) | ORAL | 0 refills | Status: DC | PRN

## 2018-10-04 NOTE — Progress Notes (Signed)
Single 30 day prescription for tramadol 50mg  tablets sent to his pharmacy per pharmacy request.

## 2018-10-13 ENCOUNTER — Other Ambulatory Visit
Admission: RE | Admit: 2018-10-13 | Discharge: 2018-10-13 | Disposition: A | Discharge status: Home / Self Care | Payer: Medicare Other | Attending: Podiatry | Admitting: Podiatry

## 2018-10-13 DIAGNOSIS — E1142 Type 2 diabetes mellitus with diabetic polyneuropathy: Secondary | ICD-10-CM | POA: Diagnosis not present

## 2018-10-13 DIAGNOSIS — I739 Peripheral vascular disease, unspecified: Secondary | ICD-10-CM | POA: Diagnosis not present

## 2018-10-13 DIAGNOSIS — M869 Osteomyelitis, unspecified: Secondary | ICD-10-CM | POA: Diagnosis not present

## 2018-10-13 DIAGNOSIS — M86172 Other acute osteomyelitis, left ankle and foot: Secondary | ICD-10-CM | POA: Diagnosis not present

## 2018-10-13 DIAGNOSIS — L97524 Non-pressure chronic ulcer of other part of left foot with necrosis of bone: Secondary | ICD-10-CM | POA: Diagnosis not present

## 2018-10-18 ENCOUNTER — Other Ambulatory Visit
Admission: RE | Admit: 2018-10-18 | Discharge: 2018-10-18 | Disposition: A | Discharge status: Home / Self Care | Payer: Medicare Other | Source: Ambulatory Visit | Attending: Adult Health | Admitting: Adult Health

## 2018-10-18 ENCOUNTER — Other Ambulatory Visit: Payer: Self-pay | Admitting: Podiatry

## 2018-10-18 DIAGNOSIS — I10 Essential (primary) hypertension: Secondary | ICD-10-CM | POA: Insufficient documentation

## 2018-10-18 DIAGNOSIS — E11621 Type 2 diabetes mellitus with foot ulcer: Secondary | ICD-10-CM | POA: Diagnosis not present

## 2018-10-18 DIAGNOSIS — M1990 Unspecified osteoarthritis, unspecified site: Secondary | ICD-10-CM | POA: Insufficient documentation

## 2018-10-18 DIAGNOSIS — Z79899 Other long term (current) drug therapy: Secondary | ICD-10-CM | POA: Diagnosis not present

## 2018-10-18 DIAGNOSIS — E1151 Type 2 diabetes mellitus with diabetic peripheral angiopathy without gangrene: Secondary | ICD-10-CM | POA: Insufficient documentation

## 2018-10-18 DIAGNOSIS — E785 Hyperlipidemia, unspecified: Secondary | ICD-10-CM | POA: Diagnosis not present

## 2018-10-18 DIAGNOSIS — Z8249 Family history of ischemic heart disease and other diseases of the circulatory system: Secondary | ICD-10-CM | POA: Insufficient documentation

## 2018-10-18 DIAGNOSIS — E119 Type 2 diabetes mellitus without complications: Secondary | ICD-10-CM | POA: Diagnosis not present

## 2018-10-18 DIAGNOSIS — Z87891 Personal history of nicotine dependence: Secondary | ICD-10-CM | POA: Insufficient documentation

## 2018-10-18 DIAGNOSIS — Z7982 Long term (current) use of aspirin: Secondary | ICD-10-CM | POA: Insufficient documentation

## 2018-10-18 DIAGNOSIS — Z881 Allergy status to other antibiotic agents status: Secondary | ICD-10-CM | POA: Diagnosis not present

## 2018-10-18 DIAGNOSIS — I739 Peripheral vascular disease, unspecified: Secondary | ICD-10-CM | POA: Diagnosis not present

## 2018-10-18 DIAGNOSIS — Z880 Allergy status to penicillin: Secondary | ICD-10-CM | POA: Diagnosis not present

## 2018-10-18 DIAGNOSIS — Z882 Allergy status to sulfonamides status: Secondary | ICD-10-CM | POA: Insufficient documentation

## 2018-10-18 DIAGNOSIS — Z8349 Family history of other endocrine, nutritional and metabolic diseases: Secondary | ICD-10-CM | POA: Diagnosis not present

## 2018-10-18 DIAGNOSIS — Z89422 Acquired absence of other left toe(s): Secondary | ICD-10-CM | POA: Insufficient documentation

## 2018-10-18 DIAGNOSIS — L97524 Non-pressure chronic ulcer of other part of left foot with necrosis of bone: Secondary | ICD-10-CM | POA: Diagnosis not present

## 2018-10-18 DIAGNOSIS — M86172 Other acute osteomyelitis, left ankle and foot: Secondary | ICD-10-CM | POA: Insufficient documentation

## 2018-10-18 DIAGNOSIS — Z9049 Acquired absence of other specified parts of digestive tract: Secondary | ICD-10-CM | POA: Insufficient documentation

## 2018-10-18 DIAGNOSIS — I428 Other cardiomyopathies: Secondary | ICD-10-CM | POA: Insufficient documentation

## 2018-10-18 DIAGNOSIS — Z833 Family history of diabetes mellitus: Secondary | ICD-10-CM | POA: Diagnosis not present

## 2018-10-18 DIAGNOSIS — E1142 Type 2 diabetes mellitus with diabetic polyneuropathy: Secondary | ICD-10-CM | POA: Diagnosis not present

## 2018-10-18 LAB — COMPREHENSIVE METABOLIC PANEL
ALT: 22 U/L (ref 0–44)
AST: 16 U/L (ref 15–41)
Albumin: 4.2 g/dL (ref 3.5–5.0)
Alkaline Phosphatase: 62 U/L (ref 38–126)
Anion gap: 8 (ref 5–15)
BUN: 36 mg/dL — ABNORMAL HIGH (ref 8–23)
CHLORIDE: 99 mmol/L (ref 98–111)
CO2: 27 mmol/L (ref 22–32)
Calcium: 9.2 mg/dL (ref 8.9–10.3)
Creatinine, Ser: 1.45 mg/dL — ABNORMAL HIGH (ref 0.61–1.24)
GFR calc Af Amer: 58 mL/min — ABNORMAL LOW (ref >60)
GFR calc non Af Amer: 50 mL/min — ABNORMAL LOW (ref >60)
Glucose, Bld: 107 mg/dL — ABNORMAL HIGH (ref 70–99)
Potassium: 4.7 mmol/L (ref 3.5–5.1)
Sodium: 134 mmol/L — ABNORMAL LOW (ref 135–145)
Total Bilirubin: 1.5 mg/dL — ABNORMAL HIGH (ref 0.3–1.2)
Total Protein: 7.4 g/dL (ref 6.5–8.1)

## 2018-10-18 LAB — CBC WITH DIFFERENTIAL/PLATELET
ABS IMMATURE GRANULOCYTES: 0.03 10*3/uL (ref 0.00–0.07)
Basophils Absolute: 0.1 10*3/uL (ref 0.0–0.1)
Basophils Relative: 1 %
Eosinophils Absolute: 0.2 10*3/uL (ref 0.0–0.5)
Eosinophils Relative: 3 %
HCT: 38.2 % — ABNORMAL LOW (ref 39.0–52.0)
Hemoglobin: 12.8 g/dL — ABNORMAL LOW (ref 13.0–17.0)
IMMATURE GRANULOCYTES: 0 %
Lymphocytes Relative: 16 %
Lymphs Abs: 1.3 10*3/uL (ref 0.7–4.0)
MCH: 29.3 pg (ref 26.0–34.0)
MCHC: 33.5 g/dL (ref 30.0–36.0)
MCV: 87.4 fL (ref 80.0–100.0)
MONOS PCT: 8 %
Monocytes Absolute: 0.6 10*3/uL (ref 0.1–1.0)
NEUTROS PCT: 72 %
Neutro Abs: 5.7 10*3/uL (ref 1.7–7.7)
Platelets: 203 10*3/uL (ref 150–400)
RBC: 4.37 MIL/uL (ref 4.22–5.81)
RDW: 12.2 % (ref 11.5–15.5)
WBC: 7.9 10*3/uL (ref 4.0–10.5)
nRBC: 0 % (ref 0.0–0.2)

## 2018-10-18 LAB — PSA: Prostatic Specific Antigen: 0.26 ng/mL (ref 0.00–4.00)

## 2018-10-18 LAB — LIPID PANEL
Cholesterol: 87 mg/dL (ref 0–200)
HDL: 26 mg/dL — AB (ref >40)
LDL Cholesterol: 38 mg/dL (ref 0–99)
Total CHOL/HDL Ratio: 3.3 RATIO
Triglycerides: 113 mg/dL (ref <150)
VLDL: 23 mg/dL (ref 0–40)

## 2018-10-18 LAB — TSH: TSH: 4.441 u[IU]/mL (ref 0.350–4.500)

## 2018-10-18 LAB — T4, FREE: Free T4: 1.07 ng/dL (ref 0.82–1.77)

## 2018-10-19 LAB — HIV ANTIBODY (ROUTINE TESTING W REFLEX): HIV Screen 4th Generation wRfx: NONREACTIVE

## 2018-10-19 LAB — HEPATITIS C ANTIBODY: HCV Ab: <0.1 s/co ratio (ref 0.0–0.9)

## 2018-10-19 MED ORDER — CLINDAMYCIN PHOSPHATE 900 MG/50ML IV SOLN
900.0000 mg | INTRAVENOUS | Status: AC
  Administered 2018-10-20: 900 mg via INTRAVENOUS

## 2018-10-19 NOTE — OR Nursing (Signed)
Patient called [facility] for directions on what meds to take/not take for surgery tomorrow, states he was not given instruction by the office.  instructed to to take metoprolol, gabapentin and tramadol if needed for pain in the morning.  States last dose of plavix and asa was yesterday 1/22.  Instructed to hold those meds and not take any more doses. Contacted office to clarify.  Ok per [place] for pt to have had last doses of plavix and ASA on 1/22 and no more prior to surgery.

## 2018-10-20 ENCOUNTER — Ambulatory Visit
Admission: RE | Admit: 2018-10-20 | Discharge: 2018-10-20 | Disposition: A | Discharge status: Home / Self Care | Payer: Medicare Other | Source: Ambulatory Visit | Attending: Podiatry | Admitting: Podiatry

## 2018-10-20 ENCOUNTER — Encounter: Payer: Self-pay | Admitting: *Deleted

## 2018-10-20 ENCOUNTER — Ambulatory Visit: Payer: Self-pay | Admitting: Nurse Practitioner

## 2018-10-20 ENCOUNTER — Ambulatory Visit: Payer: Medicare Other | Admitting: Anesthesiology

## 2018-10-20 ENCOUNTER — Other Ambulatory Visit: Payer: Self-pay

## 2018-10-20 ENCOUNTER — Encounter
Admission: RE | Disposition: A | Discharge status: Home / Self Care | Payer: Self-pay | Source: Ambulatory Visit | Attending: Podiatry

## 2018-10-20 DIAGNOSIS — Z7984 Long term (current) use of oral hypoglycemic drugs: Secondary | ICD-10-CM | POA: Diagnosis not present

## 2018-10-20 DIAGNOSIS — E1151 Type 2 diabetes mellitus with diabetic peripheral angiopathy without gangrene: Secondary | ICD-10-CM | POA: Insufficient documentation

## 2018-10-20 DIAGNOSIS — I471 Supraventricular tachycardia: Secondary | ICD-10-CM | POA: Diagnosis not present

## 2018-10-20 DIAGNOSIS — E11621 Type 2 diabetes mellitus with foot ulcer: Secondary | ICD-10-CM | POA: Diagnosis not present

## 2018-10-20 DIAGNOSIS — E785 Hyperlipidemia, unspecified: Secondary | ICD-10-CM | POA: Insufficient documentation

## 2018-10-20 DIAGNOSIS — E1140 Type 2 diabetes mellitus with diabetic neuropathy, unspecified: Secondary | ICD-10-CM | POA: Diagnosis not present

## 2018-10-20 DIAGNOSIS — Z79899 Other long term (current) drug therapy: Secondary | ICD-10-CM | POA: Insufficient documentation

## 2018-10-20 DIAGNOSIS — I739 Peripheral vascular disease, unspecified: Secondary | ICD-10-CM | POA: Diagnosis not present

## 2018-10-20 DIAGNOSIS — E1142 Type 2 diabetes mellitus with diabetic polyneuropathy: Secondary | ICD-10-CM | POA: Diagnosis not present

## 2018-10-20 DIAGNOSIS — Z89422 Acquired absence of other left toe(s): Secondary | ICD-10-CM | POA: Insufficient documentation

## 2018-10-20 DIAGNOSIS — E1169 Type 2 diabetes mellitus with other specified complication: Secondary | ICD-10-CM | POA: Diagnosis not present

## 2018-10-20 DIAGNOSIS — I428 Other cardiomyopathies: Secondary | ICD-10-CM | POA: Diagnosis not present

## 2018-10-20 DIAGNOSIS — M86172 Other acute osteomyelitis, left ankle and foot: Secondary | ICD-10-CM | POA: Diagnosis not present

## 2018-10-20 DIAGNOSIS — I10 Essential (primary) hypertension: Secondary | ICD-10-CM | POA: Insufficient documentation

## 2018-10-20 DIAGNOSIS — L97524 Non-pressure chronic ulcer of other part of left foot with necrosis of bone: Secondary | ICD-10-CM | POA: Insufficient documentation

## 2018-10-20 DIAGNOSIS — J449 Chronic obstructive pulmonary disease, unspecified: Secondary | ICD-10-CM | POA: Insufficient documentation

## 2018-10-20 DIAGNOSIS — Z79891 Long term (current) use of opiate analgesic: Secondary | ICD-10-CM | POA: Diagnosis not present

## 2018-10-20 DIAGNOSIS — Z87891 Personal history of nicotine dependence: Secondary | ICD-10-CM | POA: Diagnosis not present

## 2018-10-20 DIAGNOSIS — Z7982 Long term (current) use of aspirin: Secondary | ICD-10-CM | POA: Diagnosis not present

## 2018-10-20 DIAGNOSIS — K219 Gastro-esophageal reflux disease without esophagitis: Secondary | ICD-10-CM | POA: Diagnosis not present

## 2018-10-20 HISTORY — PX: AMPUTATION TOE: SHX6595

## 2018-10-20 LAB — AEROBIC/ANAEROBIC CULTURE (SURGICAL/DEEP WOUND)

## 2018-10-20 LAB — AEROBIC/ANAEROBIC CULTURE W GRAM STAIN (SURGICAL/DEEP WOUND)

## 2018-10-20 LAB — GLUCOSE, CAPILLARY
Glucose-Capillary: 210 mg/dL — ABNORMAL HIGH (ref 70–99)
Glucose-Capillary: 153 mg/dL — ABNORMAL HIGH (ref 70–99)

## 2018-10-20 SURGERY — AMPUTATION, TOE
Anesthesia: General | Laterality: Left

## 2018-10-20 MED ORDER — CLINDAMYCIN PHOSPHATE 900 MG/50ML IV SOLN
INTRAVENOUS | Status: AC
  Filled 2018-10-20: qty 50

## 2018-10-20 MED ORDER — PROPOFOL 10 MG/ML IV BOLUS
INTRAVENOUS | Status: AC
  Filled 2018-10-20: qty 20

## 2018-10-20 MED ORDER — ONDANSETRON HCL 4 MG/2ML IJ SOLN: 4.0000 mg | Freq: Four times a day (QID) | INTRAMUSCULAR | Status: DC | PRN

## 2018-10-20 MED ORDER — PHENYLEPHRINE HCL 10 MG/ML IJ SOLN
INTRAMUSCULAR | Status: DC | PRN
  Administered 2018-10-20 (×4): 100 ug via INTRAVENOUS

## 2018-10-20 MED ORDER — BUPIVACAINE HCL (PF) 0.5 % IJ SOLN
INTRAMUSCULAR | Status: AC
  Filled 2018-10-20: qty 30

## 2018-10-20 MED ORDER — LIDOCAINE HCL (PF) 2 % IJ SOLN
INTRAMUSCULAR | Status: AC
  Filled 2018-10-20: qty 10

## 2018-10-20 MED ORDER — BUPIVACAINE HCL 0.5 % IJ SOLN
INTRAMUSCULAR | Status: DC | PRN
  Administered 2018-10-20: 4.5 mL

## 2018-10-20 MED ORDER — POVIDONE-IODINE 7.5 % EX SOLN
Freq: Once | CUTANEOUS | Status: DC
  Filled 2018-10-20: qty 118

## 2018-10-20 MED ORDER — PROPOFOL 500 MG/50ML IV EMUL
INTRAVENOUS | Status: DC | PRN
  Administered 2018-10-20: 75 ug/kg/min via INTRAVENOUS

## 2018-10-20 MED ORDER — HYDROCODONE-ACETAMINOPHEN 5-325 MG PO TABS: 1.0000 | ORAL_TABLET | Freq: Four times a day (QID) | ORAL | 0 refills | Status: DC | PRN

## 2018-10-20 MED ORDER — FENTANYL CITRATE (PF) 100 MCG/2ML IJ SOLN
INTRAMUSCULAR | Status: AC
  Filled 2018-10-20: qty 2

## 2018-10-20 MED ORDER — LIDOCAINE HCL (PF) 1 % IJ SOLN
INTRAMUSCULAR | Status: DC | PRN
  Administered 2018-10-20: 4.5 mL

## 2018-10-20 MED ORDER — PROPOFOL 10 MG/ML IV BOLUS
INTRAVENOUS | Status: DC | PRN
  Administered 2018-10-20: 50 mg via INTRAVENOUS

## 2018-10-20 MED ORDER — SODIUM CHLORIDE 0.9 % IV SOLN
INTRAVENOUS | Status: DC
  Administered 2018-10-20 (×2): via INTRAVENOUS

## 2018-10-20 MED ORDER — FENTANYL CITRATE (PF) 100 MCG/2ML IJ SOLN: 25.0000 ug | INTRAMUSCULAR | Status: DC | PRN

## 2018-10-20 MED ORDER — LIDOCAINE HCL (CARDIAC) PF 100 MG/5ML IV SOSY
PREFILLED_SYRINGE | INTRAVENOUS | Status: DC | PRN
  Administered 2018-10-20: 50 mg via INTRAVENOUS

## 2018-10-20 MED ORDER — MIDAZOLAM HCL 2 MG/2ML IJ SOLN
INTRAMUSCULAR | Status: AC
  Filled 2018-10-20: qty 2

## 2018-10-20 MED ORDER — ONDANSETRON HCL 4 MG PO TABS: 4.0000 mg | ORAL_TABLET | Freq: Four times a day (QID) | ORAL | Status: DC | PRN

## 2018-10-20 MED ORDER — LIDOCAINE HCL (PF) 1 % IJ SOLN
INTRAMUSCULAR | Status: AC
  Filled 2018-10-20: qty 30

## 2018-10-20 SURGICAL SUPPLY — 41 items
BANDAGE ELASTIC 4 LF NS (GAUZE/BANDAGES/DRESSINGS) ×2 IMPLANT
BLADE OSC/SAGITTAL MD 5.5X18 (BLADE) ×2 IMPLANT
BLADE SURG MINI STRL (BLADE) ×2 IMPLANT
BNDG CONFORM 2 STRL LF (GAUZE/BANDAGES/DRESSINGS) ×2 IMPLANT
BNDG CONFORM 3 STRL LF (GAUZE/BANDAGES/DRESSINGS) ×4 IMPLANT
BNDG ESMARK 4X12 TAN STRL LF (GAUZE/BANDAGES/DRESSINGS) ×2 IMPLANT
BNDG GAUZE 4.5X4.1 6PLY STRL (MISCELLANEOUS) ×2 IMPLANT
CANISTER SUCT 1200ML W/VALVE (MISCELLANEOUS) ×2 IMPLANT
COVER WAND RF STERILE (DRAPES) ×2 IMPLANT
DRAPE FLUOR MINI C-ARM 54X84 (DRAPES) ×2 IMPLANT
DRAPE XRAY CASSETTE 23X24 (DRAPES) ×2 IMPLANT
DURAPREP 26ML APPLICATOR (WOUND CARE) ×2 IMPLANT
ELECT REM PT RETURN 9FT ADLT (ELECTROSURGICAL) ×2
ELECTRODE REM PT RTRN 9FT ADLT (ELECTROSURGICAL) ×1 IMPLANT
GAUZE PACKING IODOFORM 1/2 (PACKING) ×2 IMPLANT
GAUZE PETRO XEROFOAM 1X8 (MISCELLANEOUS) ×2 IMPLANT
GAUZE SPONGE 4X4 12PLY STRL (GAUZE/BANDAGES/DRESSINGS) ×2 IMPLANT
GLOVE BIO SURGEON STRL SZ7.5 (GLOVE) ×2 IMPLANT
GLOVE INDICATOR 8.0 STRL GRN (GLOVE) ×2 IMPLANT
GOWN STRL REUS W/ TWL LRG LVL3 (GOWN DISPOSABLE) ×2 IMPLANT
GOWN STRL REUS W/TWL LRG LVL3 (GOWN DISPOSABLE) ×2
KIT TURNOVER KIT A (KITS) ×2 IMPLANT
LABEL OR SOLS (LABEL) ×2 IMPLANT
NEEDLE FILTER BLUNT 18X 1/2SAF (NEEDLE) ×1
NEEDLE FILTER BLUNT 18X1 1/2 (NEEDLE) ×1 IMPLANT
NEEDLE HYPO 25X1 1.5 SAFETY (NEEDLE) ×2 IMPLANT
NS IRRIG 500ML POUR BTL (IV SOLUTION) ×2 IMPLANT
PACK EXTREMITY ARMC (MISCELLANEOUS) ×2 IMPLANT
PAD ABD DERMACEA PRESS 5X9 (GAUZE/BANDAGES/DRESSINGS) ×4 IMPLANT
PULSAVAC PLUS IRRIG FAN TIP (DISPOSABLE) ×2
SHIELD FULL FACE ANTIFOG 7M (MISCELLANEOUS) ×2 IMPLANT
SOL .9 NS 3000ML IRR  AL (IV SOLUTION) ×1
SOL .9 NS 3000ML IRR UROMATIC (IV SOLUTION) ×1 IMPLANT
STOCKINETTE M/LG 89821 (MISCELLANEOUS) ×2 IMPLANT
STRAP SAFETY 5IN WIDE (MISCELLANEOUS) ×2 IMPLANT
SUT ETHILON 3-0 FS-10 30 BLK (SUTURE) ×2
SUT ETHILON 5-0 FS-2 18 BLK (SUTURE) ×2 IMPLANT
SUT VIC AB 4-0 FS2 27 (SUTURE) ×2 IMPLANT
SUTURE EHLN 3-0 FS-10 30 BLK (SUTURE) ×1 IMPLANT
SYR 10ML LL (SYRINGE) ×6 IMPLANT
TIP FAN IRRIG PULSAVAC PLUS (DISPOSABLE) ×1 IMPLANT

## 2018-10-20 NOTE — Transfer of Care (Signed)
Immediate Anesthesia Transfer of Care Note  Patient: [name]  Procedure(s) Performed: TOE MPJ T2 LEFT (Left )  Patient Location: PACU  Anesthesia Type:General  Level of Consciousness: awake and oriented  Airway & Oxygen Therapy: Patient Spontanous Breathing and Patient connected to nasal cannula oxygen  Post-op Assessment: Report given to RN and Post -op Vital signs reviewed and stable  Post vital signs: Reviewed and stable  Last Vitals:  Vitals Value Taken Time  BP 87/54 10/20/2018 12:52 PM  Temp    Pulse 74 10/20/2018 12:56 PM  Resp 14 10/20/2018 12:56 PM  SpO2 100 % 10/20/2018 12:56 PM  Vitals shown include unvalidated device data.  Last Pain:  Vitals:   10/20/18 0955  TempSrc: Temporal  PainSc: 0-No pain         Complications: No apparent anesthesia complications

## 2018-10-20 NOTE — H&P (Signed)
HISTORY AND PHYSICAL INTERVAL NOTE:  10/20/2018  10:30 AM  [name]  has presented today for surgery, with the diagnosis of M86.172 - ACUTE OSTEOMYELITIS LEFT FOOT L97.524 SKIN ULCER TOE LEFT FOOT WITH NECROSIS OF BONE E11.42 - DIABETES I173.9 PVD.  The various methods of treatment have been discussed with the patient.  No guarantees were given.  After consideration of risks, benefits and other options for treatment, the patient has consented to surgery.  I have reviewed the patients' chart and labs.     A history and physical examination was performed in my office.  The patient was reexamined.  There have been no changes to this history and physical examination.  Samara Deist A

## 2018-10-20 NOTE — Anesthesia Post-op Follow-up Note (Signed)
Anesthesia QCDR form completed.        

## 2018-10-20 NOTE — Op Note (Signed)
Operative note   Surgeon:Mikalah Skyles Lawyer: None    Preop diagnosis: Osteomyelitis distal left third toe    Postop diagnosis: Same    Procedure: Amputation left third toe    EBL: Minimal    Anesthesia:local and IV sedation.  Local consisted of a one-to-one mixture of 0.5% bupivacaine and 1% lidocaine plain.  Total of 9 cc was used    Hemostasis: None    Specimen: Deep wound culture and amputated left third toe for pathology    Complications: None    Operative indications:[name] is an 68 y.o. that presents today for surgical intervention.  The risks/benefits/alternatives/complications have been discussed and consent has been given.    Procedure:  Patient was brought into the OR and placed on the operating table in thesupine position. After anesthesia was obtained theleft lower extremity was prepped and draped in usual sterile fashion.  Attention was directed to the left third toe where 2 semielliptical incisions were made.  Full-thickness incision was taken down to the metatarsophalangeal joint.  The toe was disarticulated.  The wound was flushed with copious amounts of irrigation.  Closure was performed after Bovie cauterization of bleeders.  Closure were performed with a 4-0 nylon.  A bulky sterile dressing was applied to the left foot.    Patient tolerated the procedure and anesthesia well.  Was transported from the OR to the PACU with all vital signs stable and vascular status intact. To be discharged per routine protocol.  Will follow up in approximately 1 week in the outpatient clinic.

## 2018-10-20 NOTE — OR Nursing (Signed)
Per OR Room nurse (per Dr. Vickki Muff) - may shred printed rx (wasn't signed) - med has been e-scripted in to pt's pharmacy.

## 2018-10-20 NOTE — Anesthesia Preprocedure Evaluation (Addendum)
Anesthesia Evaluation  Patient identified by MRN, date of birth, ID band Patient awake    Reviewed: Allergy & Precautions, H&P , NPO status , Patient's Chart, lab work & pertinent test results  Airway Mallampati: II       Dental  (+) Edentulous Lower, Edentulous Upper   Pulmonary shortness of breath, COPD, former smoker,           Cardiovascular hypertension, + Peripheral Vascular Disease    NM perfusion study 2018: 1.Normal left ventricular function 2.Normal wall motion 3.Mild inferior scar, no evidence for ischemia   Neuro/Psych negative neurological ROS  negative psych ROS   GI/Hepatic Neg liver ROS, GERD  Controlled,  Endo/Other  diabetes  Renal/GU negative Renal ROS  negative genitourinary   Musculoskeletal   Abdominal   Peds  Hematology negative hematology ROS (+)   Anesthesia Other Findings Past Medical History: No date: Cholecystitis, chronic No date: Cholelithiasis No date: COPD (chronic obstructive pulmonary disease) ([facility]) No date: Diabetes mellitus without complication ([place]) No date: Dyspnea 2008: Fatty liver 1992: Fracture of clavicle     Comment:  left  1992: Fracture of ribs, multiple     Comment:  3 ribs No date: GERD (gastroesophageal reflux disease) No date: Hyperlipidemia No date: Hypertension No date: Paroxysmal SVT (supraventricular tachycardia) ([place]) 1992: Pelvis fracture ([place]) No date: Pleurisy No date: Pleurisy No date: Seasonal allergies  Past Surgical History: 02/25/2017: AMPUTATION TOE; Left     Comment:  Procedure: AMPUTATION TOE-LEFT 2ND MPJ;  Surgeon:               Samara Deist, DPM;  Location: [facility] ORS;  Service:               Podiatry;  Laterality: Left; 2008: BACK SURGERY No date: CHOLECYSTECTOMY 08/15/2017: COLONOSCOPY WITH PROPOFOL; N/A     Comment:  Procedure: COLONOSCOPY WITH PROPOFOL;  Surgeon:               Lollie Sails, MD;  Location[facility]  ENDOSCOPY;                Service: Endoscopy;  Laterality: N/A; 05/12/2017: ENDARTERECTOMY; Right     Comment:  Procedure: ENDARTERECTOMY CAROTID;  Surgeon: [name], MD;  Location: [facility] ORS;  Service: Vascular;                Laterality: Right; No date: ERCP No date: FOOT SURGERY; Right     Comment:  x 3     Reproductive/Obstetrics negative OB ROS                           Anesthesia Physical Anesthesia Plan  ASA: III  Anesthesia Plan: General   Post-op Pain Management:    Induction:   PONV Risk Score and Plan: Propofol infusion and TIVA  Airway Management Planned:   Additional Equipment:   Intra-op Plan:   Post-operative Plan:   Informed Consent: I have reviewed the patients History and Physical, chart, labs and discussed the procedure including the risks, benefits and alternatives for the proposed anesthesia with the patient or authorized representative who has indicated his/her understanding and acceptance.     Dental Advisory Given  Plan Discussed with: Anesthesiologist and CRNA  Anesthesia Plan Comments:         Anesthesia Quick Evaluation

## 2018-10-20 NOTE — Discharge Instructions (Addendum)
[place] [facility] [place]   1. Take your medication as prescribed.  Pain medication should be taken only as needed.  2. Keep the dressing clean, dry and intact.  3. Keep your foot elevated above the heart level for the first 48 hours.  4. Walking to the bathroom and brief periods of walking are acceptable, unless we have instructed you to be non-weight bearing.  5. Always wear your post-op shoe when walking.  Always use your crutches if you are to be non-weight bearing.  6. Do not take a shower. Baths are permissible as long as the foot is kept out of the water.   7. Every hour you are awake:  - Bend your knee 15 times. - Flex foot 15 times - Massage calf 15 times  8. Call [facility] [phone]) if any of the following problems occur: - You develop a temperature or fever. - The bandage becomes saturated with blood. - Medication does not stop your pain. - Injury of the foot occurs. - Any symptoms of infection including redness, odor, or red streaks running from wound.    AMBULATORY SURGERY  DISCHARGE INSTRUCTIONS   1) The drugs that you were given will stay in your system until tomorrow so for the next 24 hours you should not:  A) Drive an automobile B) Make any legal decisions C) Drink any alcoholic beverage   2) You may resume regular meals tomorrow.  Today it is better to start with liquids and gradually work up to solid foods.  You may eat anything you prefer, but it is better to start with liquids, then soup and crackers, and gradually work up to solid foods.   3) Please notify your doctor immediately if you have any unusual bleeding, trouble breathing, redness and pain at the surgery site, drainage, fever, or pain not relieved by medication.    Additional Instructions:  Pain med was escribed in to your pharmacy by  MD.       Please contact your physician with any problems or Same Day Surgery at [phone], Monday through Friday 6 am to 4 pm, or [place] at [facility] number at [phone].

## 2018-10-22 NOTE — Anesthesia Postprocedure Evaluation (Signed)
Anesthesia Post Note  Patient: [name]  Procedure(s) Performed: TOE MPJ T2 LEFT (Left )  Patient location during evaluation: PACU Anesthesia Type: General Level of consciousness: awake and alert Pain management: pain level controlled Vital Signs Assessment: post-procedure vital signs reviewed and stable Respiratory status: spontaneous breathing, nonlabored ventilation and respiratory function stable Cardiovascular status: blood pressure returned to baseline and stable Postop Assessment: no apparent nausea or vomiting Anesthetic complications: no     Last Vitals:  Vitals:   10/20/18 1324 10/20/18 1407  BP: 97/71 108/63  Pulse: 72 66  Resp: 12 16  Temp: 36.6 C (!) 36.2 C  SpO2: 96% 100%    Last Pain:  Vitals:   10/20/18 1407  TempSrc: Temporal  PainSc: 0-No pain                 Durenda Hurt

## 2018-10-23 ENCOUNTER — Other Ambulatory Visit: Payer: Self-pay | Admitting: Adult Health

## 2018-10-23 ENCOUNTER — Other Ambulatory Visit (INDEPENDENT_AMBULATORY_CARE_PROVIDER_SITE_OTHER): Payer: Self-pay | Admitting: Nurse Practitioner

## 2018-10-23 DIAGNOSIS — L97929 Non-pressure chronic ulcer of unspecified part of left lower leg with unspecified severity: Secondary | ICD-10-CM

## 2018-10-23 DIAGNOSIS — I739 Peripheral vascular disease, unspecified: Secondary | ICD-10-CM

## 2018-10-23 MED ORDER — LISINOPRIL 5 MG PO TABS: 5.0000 mg | ORAL_TABLET | Freq: Every day | ORAL | 2 refills | Status: DC

## 2018-10-24 ENCOUNTER — Ambulatory Visit (INDEPENDENT_AMBULATORY_CARE_PROVIDER_SITE_OTHER): Payer: Medicare Other

## 2018-10-24 ENCOUNTER — Ambulatory Visit (INDEPENDENT_AMBULATORY_CARE_PROVIDER_SITE_OTHER): Payer: Medicare Other | Admitting: Nurse Practitioner

## 2018-10-24 VITALS — BP 159/75 | HR 70 | Resp 18

## 2018-10-24 DIAGNOSIS — Z87891 Personal history of nicotine dependence: Secondary | ICD-10-CM | POA: Diagnosis not present

## 2018-10-24 DIAGNOSIS — L97929 Non-pressure chronic ulcer of unspecified part of left lower leg with unspecified severity: Secondary | ICD-10-CM

## 2018-10-24 DIAGNOSIS — I10 Essential (primary) hypertension: Secondary | ICD-10-CM

## 2018-10-24 DIAGNOSIS — K219 Gastro-esophageal reflux disease without esophagitis: Secondary | ICD-10-CM

## 2018-10-24 DIAGNOSIS — I739 Peripheral vascular disease, unspecified: Secondary | ICD-10-CM | POA: Diagnosis not present

## 2018-10-24 DIAGNOSIS — I70262 Atherosclerosis of native arteries of extremities with gangrene, left leg: Secondary | ICD-10-CM | POA: Diagnosis not present

## 2018-10-24 LAB — SURGICAL PATHOLOGY

## 2018-10-25 ENCOUNTER — Encounter (INDEPENDENT_AMBULATORY_CARE_PROVIDER_SITE_OTHER): Payer: Self-pay | Admitting: Nurse Practitioner

## 2018-10-25 ENCOUNTER — Other Ambulatory Visit (INDEPENDENT_AMBULATORY_CARE_PROVIDER_SITE_OTHER): Payer: Self-pay | Admitting: Nurse Practitioner

## 2018-10-25 ENCOUNTER — Encounter (INDEPENDENT_AMBULATORY_CARE_PROVIDER_SITE_OTHER): Payer: Self-pay

## 2018-10-25 ENCOUNTER — Telehealth (INDEPENDENT_AMBULATORY_CARE_PROVIDER_SITE_OTHER): Payer: Self-pay

## 2018-10-25 DIAGNOSIS — L97524 Non-pressure chronic ulcer of other part of left foot with necrosis of bone: Secondary | ICD-10-CM | POA: Diagnosis not present

## 2018-10-25 DIAGNOSIS — I70269 Atherosclerosis of native arteries of extremities with gangrene, unspecified extremity: Secondary | ICD-10-CM | POA: Insufficient documentation

## 2018-10-25 LAB — AEROBIC/ANAEROBIC CULTURE W GRAM STAIN (SURGICAL/DEEP WOUND)

## 2018-10-25 NOTE — Progress Notes (Signed)
Subjective:    Patient ID: [name], male    DOB: 12/23/1950, 68 y.o.   MRN: [id number] No chief complaint on file.   HPI  [name] is a 68 y.o. male that presents today as a referral from Dr. Vickki Muff.  He recently had the third digit on his left lower extremity amputated due to osteomyelitis.  The patient was previously seen on 06/27/2018, and he had little complaints at that time.  Today he endorses some claudication with ambulation.  He denies any chest pain or shortness of breath.  He endorses having stopped smoking approximately 3 years ago, however he is exposed to large amounts of secondhand smoke.  Patient denies any fever, chills, nausea, vomiting or diarrhea.  He underwent bilateral ABIs today which revealed a right ABI 0.1 with a TBI of 0.46.  His left lower extremity had an ABI 0.52 with a TBI of 0.27.  Previous ABI done on 06/27/2018 had a right ABI of 0.92 with a left of 0.71.  There was also a limited left leg duplex performed which showed diminished flow throughout the left SFA the distal SFA/proximal popliteal artery had multiple collaterals with possible occlusion versus a near occlusion.  From the popliteal artery to the distal tibial arteries are monophasic waveforms.  Right lower extremity has biphasic waveforms in the anterior tibial artery with monophasic waveforms in the posterior tibial artery.  Past Medical History:  Diagnosis Date  . Cholecystitis, chronic   . Cholelithiasis   . COPD (chronic obstructive pulmonary disease) ([place])   . Diabetes mellitus without complication ([place])   . Dyspnea   . Fatty liver 2008  . Fracture of clavicle 1992   left   . Fracture of ribs, multiple 1992   3 ribs  . GERD (gastroesophageal reflux disease)   . Hyperlipidemia   . Hypertension   . Paroxysmal SVT (supraventricular tachycardia) ([place])   . Pelvis fracture ([place]) 1992  . Pleurisy   . Pleurisy   . Seasonal allergies     Past Surgical History:  Procedure  Laterality Date  . AMPUTATION TOE Left 02/25/2017   Procedure: AMPUTATION TOE-LEFT 2ND MPJ;  Surgeon: Samara Deist, DPM;  Location: [facility] ORS;  Service: Podiatry;  Laterality: Left;  . AMPUTATION TOE Left 10/20/2018   Procedure: TOE MPJ T2 LEFT;  Surgeon: Samara Deist, DPM;  Location: [facility] ORS;  Service: Podiatry;  Laterality: Left;  . BACK SURGERY  2008  . CHOLECYSTECTOMY    . COLONOSCOPY WITH PROPOFOL N/A 08/15/2017   Procedure: COLONOSCOPY WITH PROPOFOL;  Surgeon: Lollie Sails, MD;  Location: [facility] ENDOSCOPY;  Service: Endoscopy;  Laterality: N/A;  . ENDARTERECTOMY Right 05/12/2017   Procedure: ENDARTERECTOMY CAROTID;  Surgeon: [name], MD;  Location: [facility] ORS;  Service: Vascular;  Laterality: Right;  . ERCP    . FOOT SURGERY Right    x 3    Social History   Socioeconomic History  . Marital status: Single    Spouse name: Not on file  . Number of children: Not on file  . Years of education: Not on file  . Highest education level: Not on file  Occupational History  . Not on file  Social Needs  . Financial resource strain: Not on file  . Food insecurity:    Worry: Not on file    Inability: Not on file  . Transportation needs:    Medical: Not on file    Non-medical: Not on file  Tobacco Use  .  Smoking status: Former Smoker    Last attempt to quit: 02/24/1997    Years since quitting: 21.6  . Smokeless tobacco: Never Used  Substance and Sexual Activity  . Alcohol use: No  . Drug use: No  . Sexual activity: Not on file  Lifestyle  . Physical activity:    Days per week: Not on file    Minutes per session: Not on file  . Stress: Not on file  Relationships  . Social connections:    Talks on phone: Not on file    Gets together: Not on file    Attends religious service: Not on file    Active member of club or organization: Not on file    Attends meetings of clubs or organizations: Not on file    Relationship status: Not on file  . Intimate partner violence:     Fear of current or ex partner: Not on file    Emotionally abused: Not on file    Physically abused: Not on file    Forced sexual activity: Not on file  Other Topics Concern  . Not on file  Social History Narrative  . Not on file    Family History  Problem Relation Age of Onset  . Diabetes Sister     Allergies  Allergen Reactions  . Ampicillin Other (See Comments)    Unknown  . Bactrim [Sulfamethoxazole-Trimethoprim] Other (See Comments)    Mouth sores, Sores develop in mouth  . Amoxicillin Rash and Other (See Comments)    Sores in my mouth  . Penicillins Rash and Other (See Comments)    Rash in and around the mouth Has patient had a PCN reaction causing immediate rash, facial/tongue/throat swelling, SOB or lightheadedness with hypotension: Yes Has patient had a PCN reaction causing severe rash involving mucus membranes or skin necrosis: Yes Has patient had a PCN reaction that required hospitalization: No Has patient had a PCN reaction occurring within the last 10 years: Yes If all of the above answers are "NO", then may proceed with Cephalosporin use.      Review of Systems   Review of Systems: Negative Unless Checked Constitutional: [] Weight loss  [] Fever  [] Chills Cardiac: [] Chest pain   []  Atrial Fibrillation  [] Palpitations   [] Shortness of breath when laying flat   [] Shortness of breath with exertion. [] Shortness of breath at rest Vascular:  [] Pain in legs with walking   [] Pain in legs with standing [] Pain in legs when laying flat   [x] Claudication    [] Pain in feet when laying flat    [] History of DVT   [] Phlebitis   [] Swelling in legs   [] Varicose veins   [] Non-healing ulcers Pulmonary:   [] Uses home oxygen   [] Productive cough   [] Hemoptysis   [] Wheeze  [] COPD   [] Asthma Neurologic:  [] Dizziness   [] Seizures  [] Blackouts [] History of stroke   [] History of TIA  [] Aphasia   [] Temporary Blindness   [] Weakness or numbness in arm   [] Weakness or numbness in  leg Musculoskeletal:   [x] Joint swelling   [x] Joint pain   [] Low back pain  []  History of Knee Replacement [] Arthritis [] back Surgeries  []  Spinal Stenosis    Hematologic:  [] Easy bruising  [] Easy bleeding   [] Hypercoagulable state   [] Anemic Gastrointestinal:  [] Diarrhea   [] Vomiting  [x] Gastroesophageal reflux/heartburn   [] Difficulty swallowing. [] Abdominal pain Genitourinary:  [] Chronic kidney disease   [] Difficult urination  [] Anuric   [] Blood in urine [] Frequent urination  [] Burning with urination   []   Hematuria Skin:  [] Rashes   [] Ulcers [x] Wounds Psychological:  [] History of anxiety   []  History of major depression  []  Memory Difficulties     Objective:   Physical Exam  BP (!) 159/75 (BP Location: Right Arm, Patient Position: Sitting)   Pulse 70   Resp 18   Gen: WD/WN, NAD Head: [place]/AT, No temporalis wasting.  Ear/Nose/Throat: Hearing grossly intact, nares w/o erythema or drainage Eyes: PER, EOMI, sclera nonicteric.  Neck: Supple, no masses.  No JVD.  Pulmonary:  Good air movement, no use of accessory muscles.  Cardiac: RRR Vascular: unable to palpated left lower extremity due to dressing. Vessel Right Left  Radial Palpable Palpable  Dorsalis Pedis Not Palpable   Posterior Tibial Not Palpable    Gastrointestinal: soft, non-distended. No guarding/no peritoneal signs.  Musculoskeletal: M/S 5/5 throughout.  3rd digit amputated on L. Lower extremity  Neurologic: Pain and light touch intact in extremities.  Symmetrical.  Speech is fluent. Motor exam as listed above. Psychiatric: Judgment intact, Mood & affect appropriate for pt's clinical situation. Dermatologic: No Venous rashes. No Ulcers Noted.  No changes consistent with cellulitis. Lymph : No Cervical lymphadenopathy, no lichenification or skin changes of chronic lymphedema.      Assessment & Plan:   1. Atherosclerosis of native artery of left lower extremity with gangrene ([place])  Recommend:  The patient has evidence  of severe atherosclerotic changes of both lower extremities associated with ulceration and tissue loss of the foot.  This represents a limb threatening ischemia and places the patient at the risk for limb loss.  Patient should undergo angiography of the left lower extremities with the hope for intervention for limb salvage.  The risks and benefits as well as the alternative therapies was discussed in detail with the patient.  All questions were answered.  Patient agrees to proceed with angiography.  The patient will follow up with me in the office after the procedure.    2. Essential hypertension Continue antihypertensive medications as already ordered, these medications have been reviewed and there are no changes at this time.   3. Gastroesophageal reflux disease without esophagitis Continue PPI as already ordered, this medication has been reviewed and there are no changes at this time.  Avoidence of caffeine and alcohol  Moderate elevation of the head of the bed    Current Outpatient Medications on File Prior to Visit  Medication Sig Dispense Refill  . aspirin EC 81 MG tablet Take 81 mg by mouth daily.    . Calcium Carbonate-Vitamin D (CALCIUM PLUS VITAMIN D PO) Take 1 tablet by mouth daily.    . ciprofloxacin (CIPRO) 500 MG tablet Take 500 mg by mouth 2 (two) times daily.    . clopidogrel (PLAVIX) 75 MG tablet TAKE 1 TABLET BY MOUTH ONCE DAILY (Patient taking differently: Take 75 mg by mouth daily. ) 90 tablet 3  . diphenhydrAMINE (BENADRYL) 25 mg capsule Take 25 mg by mouth daily as needed for itching or allergies.     . famotidine (PEPCID) 20 MG tablet Take 20 mg by mouth daily as needed for heartburn or indigestion.    . gabapentin (NEURONTIN) 100 MG capsule TAKE 2 CAPSULES BY MOUTH UP TO THREE TIMES DAILY (Patient taking differently: Take 200 mg by mouth 3 (three) times daily. ) 180 capsule 1  . glyBURIDE (DIABETA) 2.5 MG tablet Take 1 tablet (2.5 mg total) by mouth 2 (two) times  daily with a meal. 180 tablet 2  . HYDROcodone-acetaminophen (NORCO) 5-325  MG tablet Take 1 tablet by mouth every 6 (six) hours as needed for moderate pain. 30 tablet 0  . HYDROcodone-acetaminophen (NORCO) 5-325 MG tablet Take 1 tablet by mouth every 6 (six) hours as needed for moderate pain. 30 tablet 0  . ipratropium (ATROVENT HFA) 17 MCG/ACT inhaler Inhale 2 puffs into the lungs every 4 (four) hours as needed for wheezing.    Marland Kitchen lisinopril (PRINIVIL,ZESTRIL) 5 MG tablet Take 1 tablet (5 mg total) by mouth daily. 90 tablet 2  . lovastatin (MEVACOR) 10 MG tablet Take 1 tab  Po QHS (Patient taking differently: Take 10 mg by mouth at bedtime. ) 90 tablet 0  . metoprolol succinate (TOPROL-XL) 100 MG 24 hr tablet Take 1 tablet (100 mg total) by mouth daily. Take with or immediately following a meal. 30 tablet 0  . Naphazoline-Pheniramine (ALLERGY EYE OP) Place 1 drop into both eyes daily as needed (for allergies).    . Omega-3 Fatty Acids (FISH OIL PO) Take 1 capsule by mouth daily.    . traMADol (ULTRAM) 50 MG tablet Take 1 tablet (50 mg total) by mouth 2 (two) times daily as needed for moderate pain or severe pain. 60 tablet 0  . vitamin B-12 (CYANOCOBALAMIN) 500 MCG tablet Take 500 mcg by mouth daily.      No current facility-administered medications on file prior to visit.     There are no Patient Instructions on file for this visit. No follow-ups on file.   Kris Hartmann, NP  This note was completed with Sales executive.  Any errors are purely unintentional.

## 2018-10-25 NOTE — Telephone Encounter (Signed)
Spoke with the patient and discussed his angio procedure with Dr. Lucky Cowboy for 10/30/2018 with a 10:15 arrival time. I also gave the patient the day and time of his pre-op at the [facility] for 10/27/2018 @ 7:30 am. Patient understood the information will be mailed out but his pre-op is this Friday.

## 2018-10-26 ENCOUNTER — Encounter: Payer: Self-pay | Admitting: Nurse Practitioner

## 2018-10-26 ENCOUNTER — Ambulatory Visit (INDEPENDENT_AMBULATORY_CARE_PROVIDER_SITE_OTHER): Payer: Medicare Other | Admitting: Nurse Practitioner

## 2018-10-26 VITALS — BP 120/80 | HR 65 | Resp 16 | Ht 70.0 in | Wt 177.0 lb

## 2018-10-26 DIAGNOSIS — Z23 Encounter for immunization: Secondary | ICD-10-CM | POA: Diagnosis not present

## 2018-10-26 DIAGNOSIS — N3281 Overactive bladder: Secondary | ICD-10-CM | POA: Diagnosis not present

## 2018-10-26 DIAGNOSIS — E1165 Type 2 diabetes mellitus with hyperglycemia: Secondary | ICD-10-CM

## 2018-10-26 DIAGNOSIS — M19011 Primary osteoarthritis, right shoulder: Secondary | ICD-10-CM

## 2018-10-26 DIAGNOSIS — I70262 Atherosclerosis of native arteries of extremities with gangrene, left leg: Secondary | ICD-10-CM

## 2018-10-26 DIAGNOSIS — I10 Essential (primary) hypertension: Secondary | ICD-10-CM | POA: Diagnosis not present

## 2018-10-26 DIAGNOSIS — M19012 Primary osteoarthritis, left shoulder: Secondary | ICD-10-CM | POA: Diagnosis not present

## 2018-10-26 LAB — POCT GLYCOSYLATED HEMOGLOBIN (HGB A1C): Hemoglobin A1C: 7.5 % — AB (ref 4.0–5.6)

## 2018-10-26 MED ORDER — TRAMADOL HCL 50 MG PO TABS: 50.0000 mg | ORAL_TABLET | Freq: Two times a day (BID) | ORAL | 3 refills | Status: DC | PRN

## 2018-10-26 MED ORDER — OXYBUTYNIN CHLORIDE 5 MG PO TABS: 5.0000 mg | ORAL_TABLET | Freq: Two times a day (BID) | ORAL | 3 refills | Status: DC

## 2018-10-26 NOTE — Progress Notes (Signed)
[facility] [place], [place] [address]  Internal MEDICINE  Office Visit Note  Patient Name: [name]  [id number]  [id number]  Date of Service: 10/26/2018  Chief Complaint  Patient presents with  . Hypertension  . Hyperlipidemia  . Diabetes  . Quality Metric Gaps    pneumonia     Chronic low back and shoulder pain. Does take tramadol 50mg  tablets as needed to help with moderate to severe pain. He has bulging disk in the lumbar spine which is not operable. He has been taking this prescription for some time. Helps to relieve his pain and allows him to participate in usual activities and continue working. He has no negative side effects from taking this medication.  Did have surgery on the left foot, removing the distal joint of the third toe of the foot on Friday. Currently in a surgical shoe.  Doing well. Really not having pain from surgery. Is supposed to have surgery to open up blood vessels in the legs on this coming Monday.   Diabetes  He presents for his follow-up diabetic visit. He has type 2 diabetes mellitus. No MedicAlert identification noted. His disease course has been worsening. There are no hypoglycemic associated symptoms. Pertinent negatives for hypoglycemia include no headaches, nervousness/anxiousness, pallor or tremors. Associated symptoms include fatigue. Pertinent negatives for diabetes include no chest pain and no polyuria. There are no hypoglycemic complications. Symptoms are stable. Diabetic complications include a CVA, peripheral neuropathy and PVD. Risk factors for coronary artery disease include diabetes mellitus, dyslipidemia, hypertension and male sex. Current diabetic treatment includes oral agent (monotherapy). He is compliant with treatment most of the time. His weight is stable. He is following a generally healthy diet. When asked about meal planning, he reported none. He has not had a previous visit with a dietitian. He participates  in exercise intermittently. His home blood glucose trend is increasing steadily. An ACE inhibitor/angiotensin II receptor blocker is being taken. He sees a podiatrist.Eye exam is current.       Current Medication: Outpatient Encounter Medications as of 10/26/2018  Medication Sig Note  . aspirin EC 81 MG tablet Take 81 mg by mouth daily.   . Calcium Carbonate-Vitamin D (CALCIUM PLUS VITAMIN D PO) Take 1 tablet by mouth daily.   . ciprofloxacin (CIPRO) 500 MG tablet Take 500 mg by mouth 2 (two) times daily. 10/19/2018: For 10 days  . clopidogrel (PLAVIX) 75 MG tablet TAKE 1 TABLET BY MOUTH ONCE DAILY (Patient taking differently: Take 75 mg by mouth daily. )   . diphenhydrAMINE (BENADRYL) 25 mg capsule Take 25 mg by mouth daily as needed for itching or allergies.    . famotidine (PEPCID) 20 MG tablet Take 20 mg by mouth daily as needed for heartburn or indigestion.   . gabapentin (NEURONTIN) 100 MG capsule TAKE 2 CAPSULES BY MOUTH UP TO THREE TIMES DAILY (Patient taking differently: Take 200 mg by mouth 3 (three) times daily. )   . glyBURIDE (DIABETA) 2.5 MG tablet Take 1 tablet (2.5 mg total) by mouth 2 (two) times daily with a meal.   . HYDROcodone-acetaminophen (NORCO) 5-325 MG tablet Take 1 tablet by mouth every 6 (six) hours as needed for moderate pain.   Marland Kitchen HYDROcodone-acetaminophen (NORCO) 5-325 MG tablet Take 1 tablet by mouth every 6 (six) hours as needed for moderate pain.   Marland Kitchen ipratropium (ATROVENT HFA) 17 MCG/ACT inhaler Inhale 2 puffs into the lungs every 4 (four) hours as needed for wheezing.   Marland Kitchen  lisinopril (PRINIVIL,ZESTRIL) 5 MG tablet Take 1 tablet (5 mg total) by mouth daily.   Marland Kitchen lovastatin (MEVACOR) 10 MG tablet Take 1 tab  Po QHS (Patient taking differently: Take 10 mg by mouth at bedtime. )   . metoprolol succinate (TOPROL-XL) 100 MG 24 hr tablet Take 1 tablet (100 mg total) by mouth daily. Take with or immediately following a meal.   . Naphazoline-Pheniramine (ALLERGY EYE OP)  Place 1 drop into both eyes daily as needed (for allergies).   . Omega-3 Fatty Acids (FISH OIL PO) Take 1 capsule by mouth daily.   . traMADol (ULTRAM) 50 MG tablet Take 1 tablet (50 mg total) by mouth 2 (two) times daily as needed for moderate pain or severe pain.   . vitamin B-12 (CYANOCOBALAMIN) 500 MCG tablet Take 500 mcg by mouth daily.    . [DISCONTINUED] traMADol (ULTRAM) 50 MG tablet Take 1 tablet (50 mg total) by mouth 2 (two) times daily as needed for moderate pain or severe pain.   Marland Kitchen oxybutynin (DITROPAN) 5 MG tablet Take 1 tablet (5 mg total) by mouth 2 (two) times daily.    No facility-administered encounter medications on file as of 10/26/2018.     Surgical History: Past Surgical History:  Procedure Laterality Date  . AMPUTATION TOE Left 02/25/2017   Procedure: AMPUTATION TOE-LEFT 2ND MPJ;  Surgeon: Samara Deist, DPM;  Location: [facility] ORS;  Service: Podiatry;  Laterality: Left;  . AMPUTATION TOE Left 10/20/2018   Procedure: TOE MPJ T2 LEFT;  Surgeon: Samara Deist, DPM;  Location: [facility] ORS;  Service: Podiatry;  Laterality: Left;  . BACK SURGERY  2008  . CHOLECYSTECTOMY    . COLONOSCOPY WITH PROPOFOL N/A 08/15/2017   Procedure: COLONOSCOPY WITH PROPOFOL;  Surgeon: Lollie Sails, MD;  Location[facility] ENDOSCOPY;  Service: Endoscopy;  Laterality: N/A;  . ENDARTERECTOMY Right 05/12/2017   Procedure: ENDARTERECTOMY CAROTID;  Surgeon: [name], MD;  Location: [facility] ORS;  Service: Vascular;  Laterality: Right;  . ERCP    . FOOT SURGERY Right    x 3    Medical History: Past Medical History:  Diagnosis Date  . Cholecystitis, chronic   . Cholelithiasis   . COPD (chronic obstructive pulmonary disease) ([place])   . Diabetes mellitus without complication ([place])   . Dyspnea   . Fatty liver 2008  . Fracture of clavicle 1992   left   . Fracture of ribs, multiple 1992   3 ribs  . GERD (gastroesophageal reflux disease)   . Hyperlipidemia   . Hypertension   . Paroxysmal SVT  (supraventricular tachycardia) ([place])   . Pelvis fracture ([place]) 1992  . Pleurisy   . Pleurisy   . Seasonal allergies     Family History: Family History  Problem Relation Age of Onset  . Diabetes Sister     Social History   Socioeconomic History  . Marital status: Single    Spouse name: Not on file  . Number of children: Not on file  . Years of education: Not on file  . Highest education level: Not on file  Occupational History  . Not on file  Social Needs  . Financial resource strain: Not on file  . Food insecurity:    Worry: Not on file    Inability: Not on file  . Transportation needs:    Medical: Not on file    Non-medical: Not on file  Tobacco Use  . Smoking status: Former Smoker    Last attempt to quit:  02/24/1997    Years since quitting: 21.6  . Smokeless tobacco: Never Used  Substance and Sexual Activity  . Alcohol use: No  . Drug use: No  . Sexual activity: Not on file  Lifestyle  . Physical activity:    Days per week: Not on file    Minutes per session: Not on file  . Stress: Not on file  Relationships  . Social connections:    Talks on phone: Not on file    Gets together: Not on file    Attends religious service: Not on file    Active member of club or organization: Not on file    Attends meetings of clubs or organizations: Not on file    Relationship status: Not on file  . Intimate partner violence:    Fear of current or ex partner: Not on file    Emotionally abused: Not on file    Physically abused: Not on file    Forced sexual activity: Not on file  Other Topics Concern  . Not on file  Social History Narrative  . Not on file      Review of Systems  Constitutional: Positive for fatigue. Negative for activity change, chills and unexpected weight change.  HENT: Negative for congestion, postnasal drip, rhinorrhea, sneezing and sore throat.   Respiratory: Negative for cough, chest tightness, shortness of breath and wheezing.    Cardiovascular: Negative for chest pain and palpitations.  Gastrointestinal: Negative for abdominal distention, abdominal pain, anal bleeding, constipation, diarrhea, nausea and vomiting.  Endocrine: Negative for cold intolerance, heat intolerance and polyuria.       Blood sugars a little elevated this time. HgbA1c 7.5 today, up from 6.7.    Genitourinary: Positive for frequency and urgency. Negative for dysuria.       Nocturia. States that he is getting up three or four time per night to urinate.   Musculoskeletal: Positive for arthralgias, back pain and myalgias. Negative for joint swelling and neck pain.       Chronic, bilateral shoulder pain. Intermittent and made worse with exertion.   Skin: Negative for color change, pallor, rash and wound.  Allergic/Immunologic: Positive for environmental allergies.  Neurological: Negative for tremors, numbness and headaches.  Hematological: Negative for adenopathy. Does not bruise/bleed easily.  Psychiatric/Behavioral: Negative for behavioral problems (Depression), sleep disturbance and suicidal ideas. The patient is not nervous/anxious.     Today's Vitals   10/26/18 0830  BP: 120/80  Pulse: 65  Resp: 16  SpO2: 98%  Weight: 177 lb (80.3 kg)  Height: 5\' 10"  (1.778 m)   Body mass index is 25.4 kg/m.  Physical Exam Vitals signs and nursing note reviewed.  Constitutional:      General: He is not in acute distress.    Appearance: Normal appearance. He is well-developed. He is not diaphoretic.  HENT:     Head: Normocephalic and atraumatic.     Mouth/Throat:     Pharynx: No oropharyngeal exudate.  Eyes:     Pupils: Pupils are equal, round, and reactive to light.  Neck:     Musculoskeletal: Normal range of motion and neck supple.     Thyroid: No thyromegaly.     Vascular: No carotid bruit or JVD.     Trachea: No tracheal deviation.  Cardiovascular:     Rate and Rhythm: Normal rate and regular rhythm.     Heart sounds: Normal heart  sounds. No murmur. No friction rub. No gallop.   Pulmonary:  Effort: Pulmonary effort is normal. No respiratory distress.     Breath sounds: Wheezing present. No rales.  Chest:     Chest wall: No tenderness.  Abdominal:     General: Bowel sounds are normal.     Palpations: Abdomen is soft.     Tenderness: There is no abdominal tenderness.  Musculoskeletal:     Comments: Intermittent, bilateral shoulder pain, resulting in reduced ROM and strength in the shoulders. No bony abnormalities or deformities are currently noted.  Moderate lower back pain also. Worse with bending and twisting at the waist. History of bulging disc in the lumbar spine. No visible or palpable abnormalities visible today  Left foot in post-operative shoe.   Lymphadenopathy:     Cervical: No cervical adenopathy.  Skin:    General: Skin is warm and dry.  Neurological:     Mental Status: He is alert and oriented to person, place, and time.     Cranial Nerves: No cranial nerve deficit.  Psychiatric:        Behavior: Behavior normal.        Thought Content: Thought content normal.        Judgment: Judgment normal.   Assessment/Plan:  1. Uncontrolled type 2 diabetes mellitus with hyperglycemia (HCC) - POCT HgB A1C 7.5 today. No medication changes made. Reviewed importance of adhereing to ADA diet and gradually increasing regular physical activity.   2. Essential hypertension Stable. Continue bp medication as prescribed   3. Need for vaccination against Streptococcus pneumoniae using pneumococcal conjugate vaccine 13 Prescription for Prevnar 13 sent to his pharmacy for administration.  - Pneumococcal conjugate vaccine 13-valent IM  4. Primary osteoarthritis of shoulders, bilateral May continue tramadol 50mg  twice daily as needed for moderate to severe pain. New prescription sent to his pharmacy today.  - traMADol (ULTRAM) 50 MG tablet; Take 1 tablet (50 mg total) by mouth 2 (two) times daily as needed for  moderate pain or severe pain.  Dispense: 60 tablet; Refill: 3  5. Atherosclerosis of native artery of left lower extremity with gangrene [facility]) Procedure scheduled Monday with Dr. Lucky Cowboy to improve circulation   6. Overactive bladder Restart oxybutynin 5mg  twice daily as needed for urinary frequency,espcially nocturia.  - oxybutynin (DITROPAN) 5 MG tablet; Take 1 tablet (5 mg total) by mouth 2 (two) times daily.  Dispense: 60 tablet; Refill: 3  General Counseling: [name] verbalizes understanding of the findings of todays visit and agrees with plan of treatment. I have discussed any further diagnostic evaluation that may be needed or ordered today. We also reviewed his medications today. he has been encouraged to call the office with any questions or concerns that should arise related to todays visit.  Diabetes Counseling:  1. Addition of ACE inh/ ARB'S for nephroprotection. Microalbumin is updated  2. Diabetic foot care, prevention of complications. Podiatry consult 3. Exercise and lose weight.  4. Diabetic eye examination, Diabetic eye exam is updated  5. Monitor blood sugar closlely. nutrition counseling.  6. Sign and symptoms of hypoglycemia including shaking sweating,confusion and headaches.  This patient was seen by Leretha Pol FNP Collaboration with Dr [name] as a part of collaborative care agreement  Orders Placed This Encounter  Procedures  . Pneumococcal conjugate vaccine 13-valent IM  . POCT HgB A1C    Meds ordered this encounter  Medications  . traMADol (ULTRAM) 50 MG tablet    Sig: Take 1 tablet (50 mg total) by mouth 2 (two) times daily as needed for  moderate pain or severe pain.    Dispense:  60 tablet    Refill:  3    Order Specific Question:   Supervising Provider    Answer:   [name] [[id number]]  . oxybutynin (DITROPAN) 5 MG tablet    Sig: Take 1 tablet (5 mg total) by mouth 2 (two) times daily.    Dispense:  60 tablet    Refill:  3    Order Specific  Question:   Supervising Provider    Answer:   [name] [[id number]]    Time spent: 72 Minutes      Dr [name] Internal medicine

## 2018-10-27 ENCOUNTER — Telehealth (INDEPENDENT_AMBULATORY_CARE_PROVIDER_SITE_OTHER): Payer: Self-pay

## 2018-10-27 ENCOUNTER — Encounter: Admission: RE | Admit: 2018-10-27 | Payer: Medicare Other | Source: Ambulatory Visit

## 2018-10-27 NOTE — Telephone Encounter (Signed)
Spoke with the patient and assured him that he is scheduled for his procedure with Dr. Lucky Cowboy on 10/30/2018 with a 10:15 am arrival time.

## 2018-10-29 MED ORDER — CLINDAMYCIN PHOSPHATE 300 MG/50ML IV SOLN
300.0000 mg | Freq: Once | INTRAVENOUS | Status: AC
  Administered 2018-10-30: 300 mg via INTRAVENOUS

## 2018-10-30 ENCOUNTER — Other Ambulatory Visit: Payer: Self-pay

## 2018-10-30 ENCOUNTER — Ambulatory Visit
Admission: RE | Admit: 2018-10-30 | Discharge: 2018-10-30 | Disposition: A | Discharge status: Home / Self Care | Payer: Medicare Other | Attending: Vascular Surgery | Admitting: Vascular Surgery

## 2018-10-30 ENCOUNTER — Encounter
Admission: RE | Disposition: A | Discharge status: Home / Self Care | Payer: Self-pay | Source: Home / Self Care | Attending: Vascular Surgery

## 2018-10-30 ENCOUNTER — Encounter: Payer: Self-pay | Admitting: *Deleted

## 2018-10-30 DIAGNOSIS — I7092 Chronic total occlusion of artery of the extremities: Secondary | ICD-10-CM | POA: Diagnosis not present

## 2018-10-30 DIAGNOSIS — I70262 Atherosclerosis of native arteries of extremities with gangrene, left leg: Secondary | ICD-10-CM | POA: Diagnosis not present

## 2018-10-30 DIAGNOSIS — I739 Peripheral vascular disease, unspecified: Secondary | ICD-10-CM

## 2018-10-30 DIAGNOSIS — Z7982 Long term (current) use of aspirin: Secondary | ICD-10-CM | POA: Diagnosis not present

## 2018-10-30 DIAGNOSIS — L97529 Non-pressure chronic ulcer of other part of left foot with unspecified severity: Secondary | ICD-10-CM | POA: Diagnosis not present

## 2018-10-30 DIAGNOSIS — K219 Gastro-esophageal reflux disease without esophagitis: Secondary | ICD-10-CM | POA: Insufficient documentation

## 2018-10-30 DIAGNOSIS — I10 Essential (primary) hypertension: Secondary | ICD-10-CM | POA: Diagnosis not present

## 2018-10-30 DIAGNOSIS — Z79899 Other long term (current) drug therapy: Secondary | ICD-10-CM | POA: Diagnosis not present

## 2018-10-30 DIAGNOSIS — I70245 Atherosclerosis of native arteries of left leg with ulceration of other part of foot: Secondary | ICD-10-CM | POA: Diagnosis not present

## 2018-10-30 HISTORY — PX: LOWER EXTREMITY ANGIOGRAPHY: CATH118251

## 2018-10-30 LAB — GLUCOSE, CAPILLARY
Glucose-Capillary: 96 mg/dL (ref 70–99)
Glucose-Capillary: 61 mg/dL — ABNORMAL LOW (ref 70–99)
Glucose-Capillary: 63 mg/dL — ABNORMAL LOW (ref 70–99)

## 2018-10-30 SURGERY — LOWER EXTREMITY ANGIOGRAPHY
Anesthesia: Moderate Sedation | Laterality: Left

## 2018-10-30 MED ORDER — DIPHENHYDRAMINE HCL 50 MG/ML IJ SOLN
INTRAMUSCULAR | Status: DC | PRN
  Administered 2018-10-30: 25 mg via INTRAVENOUS

## 2018-10-30 MED ORDER — SODIUM CHLORIDE 0.9% FLUSH: 3.0000 mL | INTRAVENOUS | Status: DC | PRN

## 2018-10-30 MED ORDER — SODIUM CHLORIDE 0.9 % IV SOLN
INTRAVENOUS | Status: DC
  Administered 2018-10-30: 11:00:00 via INTRAVENOUS

## 2018-10-30 MED ORDER — SODIUM CHLORIDE 0.9 % IV BOLUS
250.0000 mL | Freq: Once | INTRAVENOUS | Status: AC
  Administered 2018-10-30: 250 mL via INTRAVENOUS

## 2018-10-30 MED ORDER — FENTANYL CITRATE (PF) 100 MCG/2ML IJ SOLN
INTRAMUSCULAR | Status: DC | PRN
  Administered 2018-10-30: 50 ug via INTRAVENOUS

## 2018-10-30 MED ORDER — CLINDAMYCIN PHOSPHATE 300 MG/50ML IV SOLN
INTRAVENOUS | Status: AC
  Administered 2018-10-30: 300 mg via INTRAVENOUS
  Filled 2018-10-30: qty 50

## 2018-10-30 MED ORDER — IOPAMIDOL (ISOVUE-300) INJECTION 61%
INTRAVENOUS | Status: DC | PRN
  Administered 2018-10-30: 75 mL via INTRA_ARTERIAL

## 2018-10-30 MED ORDER — SODIUM CHLORIDE 0.9 % IV SOLN: INTRAVENOUS | Status: DC

## 2018-10-30 MED ORDER — HEPARIN SODIUM (PORCINE) 1000 UNIT/ML IJ SOLN
INTRAMUSCULAR | Status: AC
  Filled 2018-10-30: qty 1

## 2018-10-30 MED ORDER — ONDANSETRON HCL 4 MG/2ML IJ SOLN: 4.0000 mg | Freq: Four times a day (QID) | INTRAMUSCULAR | Status: DC | PRN

## 2018-10-30 MED ORDER — LIDOCAINE-EPINEPHRINE (PF) 1 %-1:200000 IJ SOLN
INTRAMUSCULAR | Status: AC
  Filled 2018-10-30: qty 30

## 2018-10-30 MED ORDER — MIDAZOLAM HCL 2 MG/ML PO SYRP: 8.0000 mg | ORAL_SOLUTION | Freq: Once | ORAL | Status: DC | PRN

## 2018-10-30 MED ORDER — LABETALOL HCL 5 MG/ML IV SOLN: 10.0000 mg | INTRAVENOUS | Status: DC | PRN

## 2018-10-30 MED ORDER — DIPHENHYDRAMINE HCL 50 MG/ML IJ SOLN: 50.0000 mg | Freq: Once | INTRAMUSCULAR | Status: DC | PRN

## 2018-10-30 MED ORDER — HEPARIN (PORCINE) IN NACL 1000-0.9 UT/500ML-% IV SOLN
INTRAVENOUS | Status: AC
  Filled 2018-10-30: qty 1000

## 2018-10-30 MED ORDER — SODIUM CHLORIDE 0.9% FLUSH: 3.0000 mL | Freq: Two times a day (BID) | INTRAVENOUS | Status: DC

## 2018-10-30 MED ORDER — HYDROCODONE-ACETAMINOPHEN 5-325 MG PO TABS
1.0000 | ORAL_TABLET | Freq: Once | ORAL | Status: AC
  Administered 2018-10-30: 1 via ORAL

## 2018-10-30 MED ORDER — HYDRALAZINE HCL 20 MG/ML IJ SOLN: 5.0000 mg | INTRAMUSCULAR | Status: DC | PRN

## 2018-10-30 MED ORDER — MIDAZOLAM HCL 2 MG/2ML IJ SOLN
INTRAMUSCULAR | Status: DC | PRN
  Administered 2018-10-30: 2 mg via INTRAVENOUS

## 2018-10-30 MED ORDER — MIDAZOLAM HCL 5 MG/5ML IJ SOLN
INTRAMUSCULAR | Status: AC
  Filled 2018-10-30: qty 5

## 2018-10-30 MED ORDER — ACETAMINOPHEN 325 MG PO TABS: 650.0000 mg | ORAL_TABLET | ORAL | Status: DC | PRN

## 2018-10-30 MED ORDER — METHYLPREDNISOLONE SODIUM SUCC 125 MG IJ SOLR: 125.0000 mg | Freq: Once | INTRAMUSCULAR | Status: DC | PRN

## 2018-10-30 MED ORDER — FAMOTIDINE 20 MG PO TABS: 40.0000 mg | ORAL_TABLET | Freq: Once | ORAL | Status: DC | PRN

## 2018-10-30 MED ORDER — HEPARIN SODIUM (PORCINE) 1000 UNIT/ML IJ SOLN
INTRAMUSCULAR | Status: DC | PRN
  Administered 2018-10-30: 5000 [IU] via INTRAVENOUS

## 2018-10-30 MED ORDER — HYDROCODONE-ACETAMINOPHEN 5-325 MG PO TABS
ORAL_TABLET | ORAL | Status: AC
  Administered 2018-10-30: 1 via ORAL
  Filled 2018-10-30: qty 1

## 2018-10-30 MED ORDER — FENTANYL CITRATE (PF) 100 MCG/2ML IJ SOLN
INTRAMUSCULAR | Status: AC
  Filled 2018-10-30: qty 2

## 2018-10-30 MED ORDER — DIPHENHYDRAMINE HCL 50 MG/ML IJ SOLN
INTRAMUSCULAR | Status: AC
  Filled 2018-10-30: qty 1

## 2018-10-30 MED ORDER — SODIUM CHLORIDE FLUSH 0.9 % IV SOLN
INTRAVENOUS | Status: AC
  Filled 2018-10-30: qty 60

## 2018-10-30 MED ORDER — SODIUM CHLORIDE 0.9 % IV SOLN: 250.0000 mL | INTRAVENOUS | Status: DC | PRN

## 2018-10-30 SURGICAL SUPPLY — 22 items
BALLN DORADO 5X200X135 (BALLOONS) ×3
BALLN LUTONIX 018 5X220X130 (BALLOONS) ×6
BALLN ULTRVRSE 3X300X150 (BALLOONS) ×2
BALLN ULTRVRSE 3X300X150 OTW (BALLOONS) ×1
BALLOON DORADO 5X200X135 (BALLOONS) ×1 IMPLANT
BALLOON LUTONIX 018 5X220X130 (BALLOONS) ×2 IMPLANT
BALLOON ULTRVRSE 3X300X150 OTW (BALLOONS) ×1 IMPLANT
CATH BEACON 5 .038 100 VERT TP (CATHETERS) ×3 IMPLANT
CATH CXI SUPP ANG 4FR 135 (CATHETERS) ×1 IMPLANT
CATH CXI SUPP ANG 4FR 135CM (CATHETERS) ×3
CATH PIG 70CM (CATHETERS) ×3 IMPLANT
DEVICE PRESTO INFLATION (MISCELLANEOUS) ×3 IMPLANT
DEVICE STARCLOSE SE CLOSURE (Vascular Products) ×3 IMPLANT
GLIDEWIRE ADV .035X260CM (WIRE) ×3 IMPLANT
PACK ANGIOGRAPHY (CUSTOM PROCEDURE TRAY) ×3 IMPLANT
SHEATH ANL2 6FRX45 HC (SHEATH) ×3 IMPLANT
SHEATH BRITE TIP 5FRX11 (SHEATH) ×3 IMPLANT
STENT VIABAHN 6X250X120 (Permanent Stent) ×6 IMPLANT
SYR MEDRAD MARK V 150ML (SYRINGE) ×3 IMPLANT
TUBING CONTRAST HIGH PRESS 72 (TUBING) ×3 IMPLANT
WIRE G V18X300CM (WIRE) ×3 IMPLANT
WIRE J 3MM .035X145CM (WIRE) ×3 IMPLANT

## 2018-10-30 NOTE — H&P (Signed)
[facility] History & Physical Update  The patient was interviewed and re-examined.  The patient's previous History and Physical has been reviewed and is unchanged.  There is no change in the plan of care. We plan to proceed with the scheduled procedure.  Leotis Pain, MD  10/30/2018, 12:05 PM

## 2018-10-30 NOTE — Discharge Instructions (Signed)
Groin Insertion Instructions-If you lose feeling or develop tingling or pain in your leg or foot after the procedure, please walk around first.  If the discomfort does not improve , contact your physician and proceed to the nearest emergency room.  Loss of feeling in your leg might mean that a blockage has formed in the artery and this can be appropriately treated.  Limit your activity for the next two days after your procedure.  Avoid stooping, bending, heavy lifting or exertion as this may put pressure on the insertion site.  Resume normal activities in 48 hours.  You may shower after 24 hours but avoid excessive warm water and do not scrub the site.  Remove clear dressing in 48 hours.  If you have had a closure device inserted, do not soak in a tub bath or a hot tub for at least one week. ° °No driving for 48 hours after discharge.  After the procedure, check the insertion site occasionally.  If any oozing occurs or there is apparent swelling, firm pressure over the site will prevent a bruise from forming.  You can not hurt anything by pressing directly on the site.  The pressure stops the bleeding by allowing a small clot to form.  If the bleeding continues after the pressure has been applied for more than 15 minutes, call 911 or go to the nearest emergency room.   ° °The x-ray dye causes you to pass a considerate amount of urine.  For this reason, you will be asked to drink plenty of liquids after the procedure to prevent dehydration.  You may resume you regular diet.  Avoid caffeine products.   ° °For pain at the site of your procedure, take non-aspirin medicines such as Tylenol. ° °Medications: A. Hold Metformin for 48 hours if applicable.  B. Continue taking all your present medications at home unless your doctor prescribes any changes ° ° °Angiogram, Care After °This sheet gives you information about how to care for yourself after your procedure. Your health care provider may also give you more specific  instructions. If you have problems or questions, contact your health care provider. °What can I expect after the procedure? °After the procedure, it is common to have bruising and tenderness at the catheter insertion area. °Follow these instructions at home: °Insertion site care °· Follow instructions from your health care provider about how to take care of your insertion site. Make sure you: °? Wash your hands with soap and water before you change your bandage (dressing). If soap and water are not available, use hand sanitizer. °? Change your dressing as told by your health care provider. °? Leave stitches (sutures), skin glue, or adhesive strips in place. These skin closures may need to stay in place for 2 weeks or longer. If adhesive strip edges start to loosen and curl up, you may trim the loose edges. Do not remove adhesive strips completely unless your health care provider tells you to do that. °· Do not take baths, swim, or use a hot tub until your health care provider approves. °· You may shower 24-48 hours after the procedure or as told by your health care provider. °? Gently wash the site with plain soap and water. °? Pat the area dry with a clean towel. °? Do not rub the site. This may cause bleeding. °· Do not apply powder or lotion to the site. Keep the site clean and dry. °· Check your insertion site every day for signs of   infection. Check for: °? Redness, swelling, or pain. °? Fluid or blood. °? Warmth. °? Pus or a bad smell. °Activity °· Rest as told by your health care provider, usually for 1-2 days. °· Do not lift anything that is heavier than 10 lbs. (4.5 kg) or as told by your health care provider. °· Do not drive for 24 hours if you were given a medicine to help you relax (sedative). °· Do not drive or use heavy machinery while taking prescription pain medicine. °General instructions ° °· Return to your normal activities as told by your health care provider, usually in about a week. Ask your  health care provider what activities are safe for you. °· If the catheter site starts bleeding, lie flat and put pressure on the site. If the bleeding does not stop, get help right away. This is a medical emergency. °· Drink enough fluid to keep your urine clear or pale yellow. This helps flush the contrast dye from your body. °· Take over-the-counter and prescription medicines only as told by your health care provider. °· Keep all follow-up visits as told by your health care provider. This is important. °Contact a health care provider if: °· You have a fever or chills. °· You have redness, swelling, or pain around your insertion site. °· You have fluid or blood coming from your insertion site. °· The insertion site feels warm to the touch. °· You have pus or a bad smell coming from your insertion site. °· You have bruising around the insertion site. °· You notice blood collecting in the tissue around the catheter site (hematoma). The hematoma may be painful to the touch. °Get help right away if: °· You have severe pain at the catheter insertion area. °· The catheter insertion area swells very fast. °· The catheter insertion area is bleeding, and the bleeding does not stop when you hold steady pressure on the area. °· The area near or just beyond the catheter insertion site becomes pale, cool, tingly, or numb. °These symptoms may represent a serious problem that is an emergency. Do not wait to see if the symptoms will go away. Get medical help right away. Call your local emergency services (911 in the [place] Do not drive yourself to the hospital. °Summary °· After the procedure, it is common to have bruising and tenderness at the catheter insertion area. °· After the procedure, it is important to rest and drink plenty of fluids. °· Do not take baths, swim, or use a hot tub until your health care provider says it is okay to do so. You may shower 24-48 hours after the procedure or as told by your health care  provider. °· If the catheter site starts bleeding, lie flat and put pressure on the site. If the bleeding does not stop, get help right away. This is a medical emergency. °This information is not intended to replace advice given to you by your health care provider. Make sure you discuss any questions you have with your health care provider. °Document Released: 04/01/2005 Document Revised: 08/18/2016 Document Reviewed: 08/18/2016 °[employer]vier Interactive Patient Education © 2019 [employer] ° °

## 2018-10-30 NOTE — Op Note (Signed)
[facility]  Percutaneous Study/Intervention Procedural Note   Date of Surgery: 10/30/2018  Surgeon(s):,    Assistants:none  Pre-operative Diagnosis: PAD with ulceration left lower extremity  Post-operative diagnosis:  Same  Procedure(s) Performed:             1.  Ultrasound guidance for vascular access right femoral artery             2.  Catheter placement into left common femoral artery from right femoral approach             3.  Aortogram and selective left lower extremity angiogram             4.  Percutaneous transluminal angioplasty of left peroneal artery and tibioperoneal trunk with 3 mm diameter angioplasty balloon             5.   Percutaneous transluminal angioplasty of the left SFA and popliteal arteries with 5 mm diameter Lutonix drug-coated angioplasty balloons  6.  Viabahn stent placement x2 to the left SFA and popliteal arteries for multiple areas of greater than 50% residual stenosis after angioplasty with both stents being 6 mm in diameter by 25 cm in length             7.  StarClose closure device right femoral artery  EBL: 5 cc  Contrast: 75 cc  Fluoro Time: 13.3 minutes  Moderate Conscious Sedation Time: approximately 45 minutes using 2 mg of Versed and 50 Mcg of Fentanyl              Indications:  Patient is a 68 y.o.male with nonhealing ulcerations of the left foot with severe peripheral arterial disease. The patient is brought in for angiography for further evaluation and potential treatment.  Due to the limb threatening nature of the situation, angiogram was performed for attempted limb salvage. The patient is aware that if the procedure fails, amputation would be expected.  The patient also understands that even with successful revascularization, amputation may still be required due to the severity of the situation.  Risks and benefits are discussed and informed consent is obtained.   Procedure:  The patient was identified and  appropriate procedural time out was performed.  The patient was then placed supine on the table and prepped and draped in the usual sterile fashion. Moderate conscious sedation was administered during a face to face encounter with the patient throughout the procedure with my supervision of the RN administering medicines and monitoring the patient's vital signs, pulse oximetry, telemetry and mental status throughout from the start of the procedure until the patient was taken to the recovery room. Ultrasound was used to evaluate the right common femoral artery.  It was patent but heavily diseased.  A digital ultrasound image was acquired.  A Seldinger needle was used to access the right common femoral artery under direct ultrasound guidance and a permanent image was performed.  A 0.035 J wire was advanced without resistance and a 5Fr sheath was placed.  Pigtail catheter was placed into the aorta and an AP aortogram was performed. This demonstrated that the renal arteries appeared to have good flow with only mild stenosis.  Aorta and iliac arteries were calcific but not stenotic. I then crossed the aortic bifurcation and advanced to the left femoral head. Selective left lower extremity angiogram was then performed. This demonstrated flush occlusion of the left SFA with reconstitution of a diseased above-knee popliteal artery.  The popliteal artery had greater than  50% stenosis in multiple areas down to the anterior tibial artery which was the dominant runoff distally.  The anterior tibial arteries not appear have any focal stenosis of greater than 50%.  The peroneal artery was continuous but had about 80 to 90% stenosis in the proximal and mid segments.  The posterior tibial artery was chronically occluded. It was felt that it was in the patient's best interest to proceed with intervention after these images to avoid a second procedure and a larger amount of contrast and fluoroscopy based off of the findings from the  initial angiogram. The patient was systemically heparinized and a 6 [place] [employer]sell sheath was then placed over the [employer] wire. I then used a Kumpe catheter and the advantage wire to navigate into the SFA occlusion and eventually cross this and get down into the peroneal artery and confirm intraluminal flow.  I then exchanged for a 0.018 wire and proceeded with treatment.  A 3 mm diameter by 30 cm length angioplasty balloon to treat the peroneal artery and tibioperoneal trunk down to the mid peroneal artery.  This was inflated to 10 atm for 1 minute.  I then used this balloon to predilate the SFA and popliteal lesions due to the calcific nature.  Definitive treatment on the SFA and popliteal arteries were used with 3 inflations and using 2 Lutonix drug-coated angioplasty balloons.  These were 5 mm in diameter by 22 cm in length with the mid segment treated with a second inflation with the initial balloon.  Each inflation was 10 to 14 atm for 1 minute.  Completion imaging showed diffuse residual disease throughout the SFA and above-knee popliteal artery with multiple areas of greater than 50% residual stenosis.  The peroneal artery was markedly improved with about a 30% residual stenosis and it was now continuous distally providing two-vessel runoff.  2 Viabahn covered stents were then deployed from the popliteal artery just below the knee up to the proximal superficial femoral artery about 1 to 2 cm from its origin.  These were postdilated with a 5 mm diameter high-pressure angioplasty balloon with excellent angiographic completion result and less than 30% residual stenosis. I elected to terminate the procedure. The sheath was removed and StarClose closure device was deployed in the right femoral artery with excellent hemostatic result. The patient was taken to the recovery room in stable condition having tolerated the procedure well.  Findings:               Aortogram:  Renal arteries appeared to have  good flow with only mild stenosis.  Aorta and iliac arteries were calcific but not stenotic.             Left lower Extremity:  This demonstrated flush occlusion of the left SFA with reconstitution of a diseased above-knee popliteal artery.  The popliteal artery had greater than 50% stenosis in multiple areas down to the anterior tibial artery which was the dominant runoff distally.  The anterior tibial arteries not appear have any focal stenosis of greater than 50%.  The peroneal artery was continuous but had about 80 to 90% stenosis in the proximal and mid segments.  The posterior tibial artery was chronically occluded   Disposition: Patient was taken to the recovery room in stable condition having tolerated the procedure well.  Complications: None  Leotis Pain 10/30/2018 2:45 PM   This note was created with Dragon Medical transcription system. Any errors in dictation are purely unintentional.

## 2018-10-31 ENCOUNTER — Encounter: Payer: Self-pay | Admitting: Vascular Surgery

## 2018-10-31 ENCOUNTER — Telehealth (INDEPENDENT_AMBULATORY_CARE_PROVIDER_SITE_OTHER): Payer: Self-pay

## 2018-10-31 DIAGNOSIS — M19011 Primary osteoarthritis, right shoulder: Secondary | ICD-10-CM

## 2018-10-31 DIAGNOSIS — M19012 Primary osteoarthritis, left shoulder: Principal | ICD-10-CM

## 2018-10-31 MED ORDER — TRAMADOL HCL 50 MG PO TABS: 50.0000 mg | ORAL_TABLET | Freq: Four times a day (QID) | ORAL | 0 refills | Status: DC | PRN

## 2018-10-31 NOTE — Addendum Note (Signed)
Addended by: Marin Roberts on: 10/31/2018 02:36 PM   Modules accepted: Orders

## 2018-10-31 NOTE — Addendum Note (Signed)
Addended by: Benson Setting L on: 10/31/2018 02:30 PM   Modules accepted: Orders

## 2018-10-31 NOTE — Telephone Encounter (Signed)
Patient called and left a message on the triage line and stated that he is in a lot of pain and would like a pain medication called in. Please advise

## 2018-10-31 NOTE — Telephone Encounter (Addendum)
Spoke with pt and made him aware of Dr. Bunnie Domino message. Pt agreed to medication. Pt would like this sent to [facility]. I have attempted to send rx electronically but it prints. Can someone please send in thanks   [name], MD  to [facility], [facility]        10/31/18 2:23 PM  He can have tramadol 50 mg Q6 hours PRN, dispense 30 with no refill.

## 2018-11-01 ENCOUNTER — Other Ambulatory Visit (INDEPENDENT_AMBULATORY_CARE_PROVIDER_SITE_OTHER): Payer: Self-pay | Admitting: Vascular Surgery

## 2018-11-01 DIAGNOSIS — M19011 Primary osteoarthritis, right shoulder: Secondary | ICD-10-CM

## 2018-11-01 DIAGNOSIS — M19012 Primary osteoarthritis, left shoulder: Secondary | ICD-10-CM

## 2018-11-01 DIAGNOSIS — I739 Peripheral vascular disease, unspecified: Secondary | ICD-10-CM

## 2018-11-01 MED ORDER — TRAMADOL HCL 50 MG PO TABS: 50.0000 mg | ORAL_TABLET | Freq: Four times a day (QID) | ORAL | 0 refills | Status: DC | PRN

## 2018-11-01 NOTE — Telephone Encounter (Signed)
The prescription has been sent through the computer at 11/01/18 @1 :07pm

## 2018-11-01 NOTE — Telephone Encounter (Signed)
Spoke with pt and informed him of message per Maudie Mercury. Pt understood and had no further questions.

## 2018-11-01 NOTE — Telephone Encounter (Signed)
Patient called back stating his Tramadol has not been received by [facility] and wanted to ask that it be resent

## 2018-11-02 ENCOUNTER — Encounter: Payer: Self-pay | Admitting: Certified Registered"

## 2018-11-03 ENCOUNTER — Other Ambulatory Visit: Payer: Self-pay

## 2018-11-03 MED ORDER — GABAPENTIN 100 MG PO CAPS: ORAL_CAPSULE | ORAL | 1 refills | Status: DC

## 2018-11-08 ENCOUNTER — Telehealth (INDEPENDENT_AMBULATORY_CARE_PROVIDER_SITE_OTHER): Payer: Self-pay

## 2018-11-08 NOTE — Telephone Encounter (Signed)
Patient had a left lower extremity angio on 10/30/2018 and wants to know how long will he be out of work.

## 2018-11-09 NOTE — Telephone Encounter (Signed)
Returned the call to the patient and gave him Dr. Bunnie Domino recommendation, patient was fine with that information. Patient then asked when was Dr. Lucky Cowboy going to take the "thing" out of his left side, I asked what was he referring to. Patient had a leg angio on 10/30/2018 with Dr. Lucky Cowboy. Patient stated the bandage. I asked the patient what was he told to do in his discharge summary, patient stated he was told to keep check and make sure it isn't bleeding. I explained that he would have to remove the bandage to keep check on any bleeding and to let us know if he has any.

## 2018-11-09 NOTE — Telephone Encounter (Signed)
Patient does have an appt in our office on 11/24/2018 with an ultrasound.

## 2018-11-13 DIAGNOSIS — R079 Chest pain, unspecified: Secondary | ICD-10-CM | POA: Diagnosis not present

## 2018-11-13 DIAGNOSIS — I428 Other cardiomyopathies: Secondary | ICD-10-CM | POA: Diagnosis not present

## 2018-11-13 DIAGNOSIS — I10 Essential (primary) hypertension: Secondary | ICD-10-CM | POA: Diagnosis not present

## 2018-11-13 DIAGNOSIS — E785 Hyperlipidemia, unspecified: Secondary | ICD-10-CM | POA: Diagnosis not present

## 2018-11-13 DIAGNOSIS — I471 Supraventricular tachycardia: Secondary | ICD-10-CM | POA: Diagnosis not present

## 2018-11-15 DIAGNOSIS — L97524 Non-pressure chronic ulcer of other part of left foot with necrosis of bone: Secondary | ICD-10-CM | POA: Diagnosis not present

## 2018-11-15 DIAGNOSIS — Z89422 Acquired absence of other left toe(s): Secondary | ICD-10-CM | POA: Diagnosis not present

## 2018-11-23 ENCOUNTER — Other Ambulatory Visit (INDEPENDENT_AMBULATORY_CARE_PROVIDER_SITE_OTHER): Payer: Self-pay | Admitting: Vascular Surgery

## 2018-11-23 DIAGNOSIS — I70249 Atherosclerosis of native arteries of left leg with ulceration of unspecified site: Secondary | ICD-10-CM

## 2018-11-23 DIAGNOSIS — Z95820 Peripheral vascular angioplasty status with implants and grafts: Secondary | ICD-10-CM

## 2018-11-24 ENCOUNTER — Ambulatory Visit (INDEPENDENT_AMBULATORY_CARE_PROVIDER_SITE_OTHER): Payer: Medicare Other | Admitting: Vascular Surgery

## 2018-11-24 ENCOUNTER — Telehealth (INDEPENDENT_AMBULATORY_CARE_PROVIDER_SITE_OTHER): Payer: Self-pay

## 2018-11-24 ENCOUNTER — Ambulatory Visit (INDEPENDENT_AMBULATORY_CARE_PROVIDER_SITE_OTHER): Payer: Medicare Other

## 2018-11-24 ENCOUNTER — Encounter (INDEPENDENT_AMBULATORY_CARE_PROVIDER_SITE_OTHER): Payer: Self-pay

## 2018-11-24 ENCOUNTER — Encounter (INDEPENDENT_AMBULATORY_CARE_PROVIDER_SITE_OTHER): Payer: Self-pay | Admitting: Vascular Surgery

## 2018-11-24 ENCOUNTER — Other Ambulatory Visit: Payer: Self-pay

## 2018-11-24 VITALS — BP 83/56 | HR 59 | Resp 12 | Ht 70.0 in | Wt 172.0 lb

## 2018-11-24 DIAGNOSIS — I10 Essential (primary) hypertension: Secondary | ICD-10-CM

## 2018-11-24 DIAGNOSIS — Z87891 Personal history of nicotine dependence: Secondary | ICD-10-CM | POA: Diagnosis not present

## 2018-11-24 DIAGNOSIS — E118 Type 2 diabetes mellitus with unspecified complications: Secondary | ICD-10-CM | POA: Diagnosis not present

## 2018-11-24 DIAGNOSIS — I70262 Atherosclerosis of native arteries of extremities with gangrene, left leg: Secondary | ICD-10-CM

## 2018-11-24 DIAGNOSIS — E782 Mixed hyperlipidemia: Secondary | ICD-10-CM | POA: Diagnosis not present

## 2018-11-24 DIAGNOSIS — Z95820 Peripheral vascular angioplasty status with implants and grafts: Secondary | ICD-10-CM

## 2018-11-24 DIAGNOSIS — I70249 Atherosclerosis of native arteries of left leg with ulceration of unspecified site: Secondary | ICD-10-CM

## 2018-11-24 DIAGNOSIS — I6523 Occlusion and stenosis of bilateral carotid arteries: Secondary | ICD-10-CM

## 2018-11-24 NOTE — Assessment & Plan Note (Signed)
His ABIs today show no improvement in his left ABI measuring 0.46 with occlusion of his left SFA/popliteal stent by duplex. His right ABI is 0.9 and stable.  This remains a critical and limb threatening situation with a toe amputation that needs to completely heal and continued rest pain in his foot.  At this point, repeat angiogram with intervention will be planned.  I discussed the patient it is likely that he will have to stay overnight for either TPA or Aggrastat infusion given the early rethrombosis.  He will also probably need to increase his anticoagulant regimen to maintain patency.  I discussed the reason and rationale for treatment.  I discussed why limb loss was possible without intervention or surgery.

## 2018-11-24 NOTE — Patient Instructions (Signed)
Peripheral Vascular Disease  Peripheral vascular disease (PVD) is a disease of the blood vessels that are not part of your heart and brain. A simple term for PVD is poor circulation. In most cases, PVD narrows the blood vessels that carry blood from your heart to the rest of your body. This can reduce the supply of blood to your arms, legs, and internal organs, like your stomach or kidneys. However, PVD most often affects a person's lower legs and feet. Without treatment, PVD tends to get worse. PVD can also lead to acute ischemic limb. This is when an arm or leg suddenly cannot get enough blood. This is a medical emergency. Follow these instructions at home: Lifestyle  Do not use any products that contain nicotine or tobacco, such as cigarettes and e-cigarettes. If you need help quitting, ask your doctor.  Lose weight if you are overweight. Or, stay at a healthy weight as told by your doctor.  Eat a diet that is low in fat and cholesterol. If you need help, ask your doctor.  Exercise regularly. Ask your doctor for activities that are right for you. General instructions  Take over-the-counter and prescription medicines only as told by your doctor.  Take good care of your feet: ? Wear comfortable shoes that fit well. ? Check your feet often for any cuts or sores.  Keep all follow-up visits as told by your doctor This is important. Contact a doctor if:  You have cramps in your legs when you walk.  You have leg pain when you are at rest.  You have coldness in a leg or foot.  Your skin changes.  You are unable to get or have an erection (erectile dysfunction).  You have cuts or sores on your feet that do not heal. Get help right away if:  Your arm or leg turns cold, numb, and blue.  Your arms or legs become red, warm, swollen, painful, or numb.  You have chest pain.  You have trouble breathing.  You suddenly have weakness in your face, arm, or leg.  You become very  confused or you cannot speak.  You suddenly have a very bad headache.  You suddenly cannot see. Summary  Peripheral vascular disease (PVD) is a disease of the blood vessels.  A simple term for PVD is poor circulation. Without treatment, PVD tends to get worse.  Treatment may include exercise, low fat and low cholesterol diet, and quitting smoking. This information is not intended to replace advice given to you by your health care provider. Make sure you discuss any questions you have with your health care provider. Document Released: 12/08/2009 Document Revised: 10/21/2016 Document Reviewed: 10/21/2016 Elsevier Interactive Patient Education  2019 [employer]  

## 2018-11-24 NOTE — Assessment & Plan Note (Signed)
blood pressure control important in reducing the progression of atherosclerotic disease. On appropriate oral medications.  

## 2018-11-24 NOTE — Progress Notes (Signed)
MRN : [id number]  [name] is a 68 y.o. (1951/06/06) male who presents with chief complaint of  Chief Complaint  Patient presents with  . Follow-up  .  History of Present Illness: Patient returns today in follow up of his PAD.  About a month ago, he underwent extensive left lower extremity revascularization for limb salvage.  He had his left third toe removed for gangrenous changes prior to this.  His leg hurts significantly for a while after the procedure but has gotten some better.  His foot still hurts.  His ABIs today show no improvement in his left ABI measuring 0.46 with occlusion of his left SFA/popliteal stent by duplex. His right ABI is 0.9 and stable.   Current Outpatient Medications  Medication Sig Dispense Refill  . aspirin EC 81 MG tablet Take 81 mg by mouth daily.    . Calcium Carbonate-Vitamin D (CALCIUM PLUS VITAMIN D PO) Take 1 tablet by mouth daily.    . ciprofloxacin (CIPRO) 500 MG tablet Take 500 mg by mouth 2 (two) times daily.    . clopidogrel (PLAVIX) 75 MG tablet TAKE 1 TABLET BY MOUTH ONCE DAILY (Patient taking differently: Take 75 mg by mouth daily. ) 90 tablet 3  . diphenhydrAMINE (BENADRYL) 25 mg capsule Take 25 mg by mouth daily as needed for itching or allergies.     Marland Kitchen gabapentin (NEURONTIN) 100 MG capsule TAKE 2 CAPSULES BY MOUTH UP TO THREE TIMES DAILY 180 capsule 1  . glyBURIDE (DIABETA) 2.5 MG tablet Take 1 tablet (2.5 mg total) by mouth 2 (two) times daily with a meal. 180 tablet 2  . ipratropium (ATROVENT HFA) 17 MCG/ACT inhaler Inhale 2 puffs into the lungs every 4 (four) hours as needed for wheezing.    Marland Kitchen lisinopril (PRINIVIL,ZESTRIL) 5 MG tablet Take 1 tablet (5 mg total) by mouth daily. 90 tablet 2  . lovastatin (MEVACOR) 10 MG tablet Take 1 tab  Po QHS (Patient taking differently: Take 10 mg by mouth at bedtime. ) 90 tablet 0  . metoprolol succinate (TOPROL-XL) 100 MG 24 hr tablet Take 1 tablet (100 mg total) by mouth daily. Take with or  immediately following a meal. 30 tablet 0  . Naphazoline-Pheniramine (ALLERGY EYE OP) Place 1 drop into both eyes daily as needed (for allergies).    . Omega-3 Fatty Acids (FISH OIL PO) Take 1 capsule by mouth daily.    Marland Kitchen oxybutynin (DITROPAN) 5 MG tablet Take 1 tablet (5 mg total) by mouth 2 (two) times daily. 60 tablet 3  . traMADol (ULTRAM) 50 MG tablet Take 1 tablet (50 mg total) by mouth every 6 (six) hours as needed for moderate pain or severe pain. 30 tablet 0  . vitamin B-12 (CYANOCOBALAMIN) 500 MCG tablet Take 500 mcg by mouth daily.     . famotidine (PEPCID) 20 MG tablet Take 20 mg by mouth daily as needed for heartburn or indigestion.    Marland Kitchen HYDROcodone-acetaminophen (NORCO) 5-325 MG tablet Take 1 tablet by mouth every 6 (six) hours as needed for moderate pain. (Patient not taking: Reported on 11/24/2018) 30 tablet 0  . HYDROcodone-acetaminophen (NORCO) 5-325 MG tablet Take 1 tablet by mouth every 6 (six) hours as needed for moderate pain. (Patient not taking: Reported on 11/24/2018) 30 tablet 0   No current facility-administered medications for this visit.     Past Medical History:  Diagnosis Date  . Cholecystitis, chronic   . Cholelithiasis   . COPD (chronic obstructive pulmonary  disease) ([place])   . Diabetes mellitus without complication ([place])   . Dyspnea   . Fatty liver 2008  . Fracture of clavicle 1992   left   . Fracture of ribs, multiple 1992   3 ribs  . GERD (gastroesophageal reflux disease)   . Hyperlipidemia   . Hypertension   . Paroxysmal SVT (supraventricular tachycardia) ([place])   . Pelvis fracture ([place]) 1992  . Pleurisy   . Pleurisy   . Seasonal allergies     Past Surgical History:  Procedure Laterality Date  . AMPUTATION TOE Left 02/25/2017   Procedure: AMPUTATION TOE-LEFT 2ND MPJ;  Surgeon: Samara Deist, DPM;  Location: [facility] ORS;  Service: Podiatry;  Laterality: Left;  . AMPUTATION TOE Left 10/20/2018   Procedure: TOE MPJ T2 LEFT;  Surgeon: Samara Deist,  DPM;  Location: [facility] ORS;  Service: Podiatry;  Laterality: Left;  . BACK SURGERY  2008  . CHOLECYSTECTOMY    . COLONOSCOPY WITH PROPOFOL N/A 08/15/2017   Procedure: COLONOSCOPY WITH PROPOFOL;  Surgeon: Lollie Sails, MD;  Location: [facility] ENDOSCOPY;  Service: Endoscopy;  Laterality: N/A;  . ENDARTERECTOMY Right 05/12/2017   Procedure: ENDARTERECTOMY CAROTID;  Surgeon: [name], MD;  Location: [facility] ORS;  Service: Vascular;  Laterality: Right;  . ERCP    . FOOT SURGERY Right    x 3  . LOWER EXTREMITY ANGIOGRAPHY Left 10/30/2018   Procedure: LOWER EXTREMITY ANGIOGRAPHY;  Surgeon: [name], MD;  Location: [place] CV LAB;  Service: Cardiovascular;  Laterality: Left;   Social History        Tobacco Use  . Smoking status: Former Smoker    Last attempt to quit: 02/24/1997    Years since quitting: 20.5  . Smokeless tobacco: Never Used  Substance Use Topics  . Alcohol use: No  . Drug use: No     Family History      Family History  Problem Relation Age of Onset  . Diabetes Sister   no bleeding disorders, clotting disorders, or aneurysms        Allergies  Allergen Reactions  . Ampicillin   . Bactrim [Sulfamethoxazole-Trimethoprim] Other (See Comments)    Mouth sores Sores develop in mouth  . Amoxicillin Rash    Sores in my mouth  . Penicillins Rash    Rash in and around the mouth Has patient had a PCN reaction causing immediate rash, facial/tongue/throat swelling, SOB or lightheadedness with hypotension: Yes Has patient had a PCN reaction causing severe rash involving mucus membranes or skin necrosis: Yes Has patient had a PCN reaction that required hospitalization: No Has patient had a PCN reaction occurring within the last 10 years: Yes If all of the above answers are "NO", then may proceed with Cephalosporin use.       REVIEW OF SYSTEMS(Negative unless checked)  Constitutional: [] ?Weight  loss[] ?Fever[] ?Chills Cardiac:[] ?Chest pain[] ?Chest pressure[x] ?Palpitations [] ?Shortness of breath when laying flat [] ?Shortness of breath at rest [] ?Shortness of breath with exertion. Vascular: [] ?Pain in legs with walking[] ?Pain in legsat rest[] ?Pain in legs when laying flat [] ?Claudication [] ?Pain in feet when walking [] ?Pain in feet at rest [] ?Pain in feet when laying flat [] ?History of DVT [] ?Phlebitis [] ?Swelling in legs [] ?Varicose veins [x] ?Non-healing ulcers Pulmonary: [] ?Uses home oxygen [] ?Productive cough[] ?Hemoptysis [] ?Wheeze [] ?COPD [] ?Asthma Neurologic: [] ?Dizziness [] ?Blackouts [] ?Seizures [] ?History of stroke [] ?History of TIA[] ?Aphasia [] ?Temporary blindness[] ?Dysphagia [] ?Weaknessor numbness in arms [x] ?Weakness or numbnessin legs Musculoskeletal: [x] ?Arthritis [] ?Joint swelling [] ?Joint pain [] ?Low back pain Hematologic:[] ?Easy bruising[] ?Easy bleeding [] ?Hypercoagulable state [] ?Anemic [] ?Hepatitis Gastrointestinal:[] ?Blood in stool[] ?Vomiting blood[] ?Gastroesophageal reflux/heartburn[] ?Difficulty  swallowing. Genitourinary: [] ?Chronic kidney disease [] ?Difficulturination [] ?Frequenturination [] ?Burning with urination[] ?Blood in urine Skin: [] ?Rashes [x] ?Ulcers [x] ?Wounds Psychological: [] ?History of anxiety[] ?History of major depression.    Physical Examination  BP (!) 83/56 (BP Location: Left Arm, Patient Position: Sitting, Cuff Size: Small)   Pulse (!) 59   Resp 12   Ht 5\' 10"  (1.778 m)   Wt 172 lb (78 kg)   BMI 24.68 kg/m  Gen:  WD/WN, NAD Head: [place]/AT, No temporalis wasting. Ear/Nose/Throat: Hearing grossly intact, nares w/o erythema or drainage Eyes: Conjunctiva clear. Sclera non-icteric Neck: Supple.  Trachea midline Pulmonary:  Good air movement, no use of accessory muscles.  Cardiac: RRR, no JVD Vascular:  Vessel Right Left  Radial  Palpable Palpable                          PT Palpable Not Palpable  DP Palpable Not Palpable   Gastrointestinal: soft, non-tender/non-distended. Musculoskeletal: M/S 5/5 throughout.  No deformity or atrophy. Trace LE edema. Neurologic: Sensation grossly intact in extremities.  Symmetrical.  Speech is fluent.  Psychiatric: Judgment intact, Mood & affect appropriate for pt's clinical situation. Dermatologic: No rashes or ulcers noted.  No cellulitis or open wounds.       Labs Recent Results (from the past 2160 hour(s))  Aerobic/Anaerobic Culture (surgical/deep wound)     Status: None   Collection Time: 10/13/18  1:40 PM  Result Value Ref Range   Specimen Description      TOE Performed at [facility], [address], [place], [place] [address]    Special Requests      LEFT Performed at [facility], [address] [place], [place] [address]    Gram Stain      RARE WBC PRESENT, PREDOMINANTLY MONONUCLEAR RARE GRAM POSITIVE COCCI    Culture      FEW PSEUDOMONAS AERUGINOSA NO ANAEROBES ISOLATED Performed at [facility], [place] [address] [place], [place] [address]    Report Status 10/20/2018 FINAL    Organism ID, Bacteria PSEUDOMONAS AERUGINOSA       Susceptibility   Pseudomonas aeruginosa - MIC*    CEFTAZIDIME 4 SENSITIVE Sensitive     CIPROFLOXACIN <=0.25 SENSITIVE Sensitive     GENTAMICIN <=1 SENSITIVE Sensitive     IMIPENEM 2 SENSITIVE Sensitive     PIP/TAZO 8 SENSITIVE Sensitive     CEFEPIME 4 SENSITIVE Sensitive     * FEW PSEUDOMONAS AERUGINOSA  Hepatitis C antibody     Status: None   Collection Time: 10/18/18  9:59 AM  Result Value Ref Range   HCV Ab <0.1 0.0 - 0.9 s/co ratio    Comment: (NOTE)                                  Negative:     < 0.8                             Indeterminate: 0.8 - 0.9                                  Positive:     > 0.9 The CDC recommends that a positive HCV antibody result be followed up with a  HCV Nucleic Acid Amplification test (382505). Performed At: [facility]  [place], [place] [id number] Rush Farmer MD [id number]   HIV Antibody (routine testing w rflx)     Status: None   Collection Time: 10/18/18  9:59 AM  Result Value Ref Range   HIV Screen 4th Generation wRfx Non Reactive Non Reactive    Comment: (NOTE) Performed At: [facility] [place], [place] [id number] Rush Farmer MD [id number]   CBC with Differential/Platelet     Status: Abnormal   Collection Time: 10/18/18  9:59 AM  Result Value Ref Range   WBC 7.9 4.0 - 10.5 K/uL   RBC 4.37 4.22 - 5.81 MIL/uL   Hemoglobin 12.8 (L) 13.0 - 17.0 g/dL   HCT 38.2 (L) 39.0 - 52.0 %   MCV 87.4 80.0 - 100.0 fL   MCH 29.3 26.0 - 34.0 pg   MCHC 33.5 30.0 - 36.0 g/dL   RDW 12.2 11.5 - 15.5 %   Platelets 203 150 - 400 K/uL   nRBC 0.0 0.0 - 0.2 %   Neutrophils Relative % 72 %   Neutro Abs 5.7 1.7 - 7.7 K/uL   Lymphocytes Relative 16 %   Lymphs Abs 1.3 0.7 - 4.0 K/uL   Monocytes Relative 8 %   Monocytes Absolute 0.6 0.1 - 1.0 K/uL   Eosinophils Relative 3 %   Eosinophils Absolute 0.2 0.0 - 0.5 K/uL   Basophils Relative 1 %   Basophils Absolute 0.1 0.0 - 0.1 K/uL   Immature Granulocytes 0 %   Abs Immature Granulocytes 0.03 0.00 - 0.07 K/uL    Comment: Performed at [facility], [address] [place], [place] [address]  Lipid panel     Status: Abnormal   Collection Time: 10/18/18  9:59 AM  Result Value Ref Range   Cholesterol 87 0 - 200 mg/dL   Triglycerides 113 <150 mg/dL   HDL 26 (L) >40 mg/dL   Total CHOL/HDL Ratio 3.3 RATIO   VLDL 23 0 - 40 mg/dL   LDL Cholesterol 38 0 - 99 mg/dL    Comment:        Total Cholesterol/HDL:CHD Risk Coronary Heart Disease Risk Table                     Men   Women  1/2 Average Risk   3.4   3.3  Average Risk       5.0   4.4  2 X Average Risk   9.6   7.1  3 X Average Risk  23.4   11.0        Use the calculated  Patient Ratio above and the CHD Risk Table to determine the patient's CHD Risk.        ATP III CLASSIFICATION (LDL):  <100     mg/dL   Optimal  100-129  mg/dL   Near or Above                    Optimal  130-159  mg/dL   Borderline  160-189  mg/dL   High  >190     mg/dL   Very High Performed at [facility], [address] [place], [place] [address]   TSH     Status: None   Collection Time: 10/18/18  9:59 AM  Result Value Ref Range   TSH 4.441 0.350 - 4.500 uIU/mL    Comment: Performed by a 3rd Generation assay with a functional sensitivity of <=0.01 uIU/mL. Performed at [facility],  [place], [place] [address]   T4, free     Status: None   Collection Time: 10/18/18  9:59 AM  Result Value Ref Range   Free T4 1.07 0.82 - 1.77 ng/dL    Comment: (NOTE) Biotin ingestion may interfere with free T4 tests. If the results are inconsistent with the TSH level, previous test results, or the clinical presentation, then consider biotin interference. If needed, order repeat testing after stopping biotin. Performed at [facility], [address] [place], [place] [address]   Comprehensive metabolic panel     Status: Abnormal   Collection Time: 10/18/18  9:59 AM  Result Value Ref Range   Sodium 134 (L) 135 - 145 mmol/L   Potassium 4.7 3.5 - 5.1 mmol/L   Chloride 99 98 - 111 mmol/L   CO2 27 22 - 32 mmol/L   Glucose, Bld 107 (H) 70 - 99 mg/dL   BUN 36 (H) 8 - 23 mg/dL   Creatinine, Ser 1.45 (H) 0.61 - 1.24 mg/dL   Calcium 9.2 8.9 - 10.3 mg/dL   Total Protein 7.4 6.5 - 8.1 g/dL   Albumin 4.2 3.5 - 5.0 g/dL   AST 16 15 - 41 U/L   ALT 22 0 - 44 U/L   Alkaline Phosphatase 62 38 - 126 U/L   Total Bilirubin 1.5 (H) 0.3 - 1.2 mg/dL   GFR calc non Af Amer 50 (L) >60 mL/min   GFR calc Af Amer 58 (L) >60 mL/min   Anion gap 8 5 - 15    Comment: Performed at [facility], [address] [place], [place] [address]  PSA     Status: None    Collection Time: 10/18/18  9:59 AM  Result Value Ref Range   Prostatic Specific Antigen 0.26 0.00 - 4.00 ng/mL    Comment: (NOTE) While PSA levels of <=4.0 ng/ml are reported as reference range, some men with levels below 4.0 ng/ml can have prostate cancer and many men with PSA above 4.0 ng/ml do not have prostate cancer.  Other tests such as free PSA, age specific reference ranges, PSA velocity and PSA doubling time may be helpful especially in men less than 70 years old. Performed at [facility], [place] [address] [place], [place] [address]   Glucose, capillary     Status: Abnormal   Collection Time: 10/20/18  9:18 AM  Result Value Ref Range   Glucose-Capillary 210 (H) 70 - 99 mg/dL  Aerobic/Anaerobic Culture (surgical/deep wound)     Status: None   Collection Time: 10/20/18 12:41 PM  Result Value Ref Range   Specimen Description      WOUND Performed at [facility], [address] [place], [place] [address]    Special Requests      NONE Performed at [facility], [address] [place], [place] [address]    Gram Stain      RARE WBC PRESENT, PREDOMINANTLY PMN RARE GRAM POSITIVE COCCI    Culture      RARE PSEUDOMONAS AERUGINOSA NO ANAEROBES ISOLATED Performed at [facility], [place] [address], [place], [place] [address]    Report Status 10/25/2018 FINAL    Organism ID, Bacteria PSEUDOMONAS AERUGINOSA       Susceptibility   Pseudomonas aeruginosa - MIC*    CEFTAZIDIME 4 SENSITIVE Sensitive     CIPROFLOXACIN <=0.25 SENSITIVE Sensitive     GENTAMICIN <=1 SENSITIVE Sensitive     IMIPENEM 2  SENSITIVE Sensitive     PIP/TAZO 8 SENSITIVE Sensitive     CEFEPIME 2 SENSITIVE Sensitive     * RARE PSEUDOMONAS AERUGINOSA  Surgical pathology     Status: None   Collection Time: 10/20/18 12:45 PM  Result Value Ref Range   SURGICAL PATHOLOGY      Surgical Pathology CASE: [id number] PATIENT: Rece Risby [facility]  Report     SPECIMEN SUBMITTED: A. 3rd toe, left amputation  CLINICAL HISTORY: None provided  PRE-OPERATIVE DIAGNOSIS: M86.172 acute Osteomyelitis  left foot, L97.524 skin ulcer toe left foot with necrosis of bone E11.42- diabetes, I173.9 PVD  POST-OPERATIVE DIAGNOSIS: Osteomyelitis  left foot     DIAGNOSIS: A.  TOE, LEFT THIRD; AMPUTATION: - SOFT TISSUE NECROSIS WITH UNDERLYING OSTEOMYELITIS. - VIABLE TISSUE AND ARTICULAR BONE AT MARGIN.  GROSS DESCRIPTION: A. Labeled: Amputation third left toe Received: Formalin Size: Proximal skin resection margin to distal tip = 4.0 cm and up to 1.5 cm in width.  Extending about the proximal soft tissue resection margin is a segment of bone measuring 1.0 cm in length by 1.2 x 0.9 cm Description of lesion(s): A distal portion of digit disarticulated at the MPJ.  The distal tip is black-brown, necrotic and mummified measuring 1.4 x 1.3 c m Proximal margin: Skin and soft tissue margin inked black and appears viable Bone: Concave gray-white articular cartilage, inked black and appears viable. Other findings:  Block summary: A1-A3.  Proximal bone margin to distal tip lesion, longitudinal section  Tissue decalcification: A1, A2 and A3   Final Diagnosis performed by Quay Burow, MD.   Electronically signed 10/24/2018 10:19:54AM The electronic signature indicates that the named Attending Pathologist has evaluated the specimen  Technical component performed at [facility], [address] [place], [place] [address] Lab: [phone] Dir: Rush Farmer, MD, MMM  Professional component performed at [facility], [place], [place], [place] [address] Lab: [phone] Dir: Dellia Nims. Rubinas, MD   Glucose, capillary     Status: Abnormal   Collection Time: 10/20/18 12:52 PM  Result Value Ref Range   Glucose-Capillary 153 (H) 70 - 99 mg/dL  POCT HgB A1C     Status: Abnormal   Collection Time: 10/26/18  8:43  AM  Result Value Ref Range   Hemoglobin A1C 7.5 (A) 4.0 - 5.6 %   HbA1c POC (<> result, manual entry)     HbA1c, POC (prediabetic range)     HbA1c, POC (controlled diabetic range)    Glucose, capillary     Status: None   Collection Time: 10/30/18 10:42 AM  Result Value Ref Range   Glucose-Capillary 96 70 - 99 mg/dL  Glucose, capillary     Status: Abnormal   Collection Time: 10/30/18  3:09 PM  Result Value Ref Range   Glucose-Capillary 61 (L) 70 - 99 mg/dL  Glucose, capillary     Status: Abnormal   Collection Time: 10/30/18  3:10 PM  Result Value Ref Range   Glucose-Capillary 63 (L) 70 - 99 mg/dL    Radiology No results found.  Assessment/Plan Hyperlipidemia lipid control important in reducing the progression of atherosclerotic disease. Continue statin therapy   Type 2 diabetes mellitus with complication (HCC) blood glucose control important in reducing the progression of atherosclerotic disease. Also, involved in wound healing. On appropriate medications.  Essential hypertension blood pressure control important in reducing the progression of atherosclerotic disease. On appropriate oral medications.   Bilateral carotid artery stenosis No new symptoms. To be checked later  this year.  Atherosclerosis of native arteries of the extremities with gangrene ([place]) His ABIs today show no improvement in his left ABI measuring 0.46 with occlusion of his left SFA/popliteal stent by duplex. His right ABI is 0.9 and stable.  This remains a critical and limb threatening situation with a toe amputation that needs to completely heal and continued rest pain in his foot.  At this point, repeat angiogram with intervention will be planned.  I discussed the patient it is likely that he will have to stay overnight for either TPA or Aggrastat infusion given the early rethrombosis.  He will also probably need to increase his anticoagulant regimen to maintain patency.  I discussed the reason and  rationale for treatment.  I discussed why limb loss was possible without intervention or surgery.    Leotis Pain, MD  11/24/2018 9:28 AM    This note was created with Dragon medical transcription system.  Any errors from dictation are purely unintentional

## 2018-11-24 NOTE — Assessment & Plan Note (Signed)
No new symptoms. To be checked later this year.

## 2018-11-24 NOTE — Telephone Encounter (Signed)
Patient was seen this morning and given his paperwork for his procedure with Dr. Lucky Cowboy on 11/02/2018 with a 8:15 am arrival time. I had to call the patient and give him his pre-op day 11/28/2018 and arrival time 7:30 am.

## 2018-11-28 ENCOUNTER — Other Ambulatory Visit (INDEPENDENT_AMBULATORY_CARE_PROVIDER_SITE_OTHER): Payer: Self-pay | Admitting: Nurse Practitioner

## 2018-11-28 ENCOUNTER — Encounter
Admission: RE | Admit: 2018-11-28 | Discharge: 2018-11-28 | Disposition: A | Discharge status: Home / Self Care | Payer: Medicare Other | Source: Ambulatory Visit | Attending: Vascular Surgery | Admitting: Vascular Surgery

## 2018-11-28 DIAGNOSIS — Z01812 Encounter for preprocedural laboratory examination: Secondary | ICD-10-CM | POA: Insufficient documentation

## 2018-11-28 HISTORY — DX: Peripheral vascular disease, unspecified: I73.9

## 2018-11-28 LAB — CREATININE, SERUM
Creatinine, Ser: 1.08 mg/dL (ref 0.61–1.24)
GFR calc Af Amer: >60 mL/min (ref >60)
GFR calc non Af Amer: >60 mL/min (ref >60)

## 2018-11-28 LAB — BUN: BUN: 26 mg/dL — ABNORMAL HIGH (ref 8–23)

## 2018-11-28 NOTE — Patient Instructions (Signed)
Your procedure is scheduled on: 11/30/18 Report to  [facility]. .  Remember: Instructions that are not followed completely may result in serious medical risk, up to and including death, or upon the discretion of your surgeon and anesthesiologist your surgery may need to be rescheduled.     _X__ 1. Do not eat food after midnight the night before your procedure.                 No gum chewing or hard candies. You may drink clear liquids up to 2 hours                 before you are scheduled to arrive for your surgery- DO not drink clear                 liquids within 2 hours of the start of your surgery.                 Clear Liquids include:  water, apple juice without pulp, clear carbohydrate                 drink such as Clearfast or Gatorade, Black Coffee or Tea (Do not add                 anything to coffee or tea).  __X__2.  On the morning of surgery brush your teeth with toothpaste and water, you                 may rinse your mouth with mouthwash if you wish.  Do not swallow any              toothpaste of mouthwash.     _X__ 3.  No Alcohol for 24 hours before or after surgery.   _X__ 4.  Do Not Smoke or use e-cigarettes For 24 Hours Prior to Your Surgery.                 Do not use any chewable tobacco products for at least 6 hours prior to                 surgery.  ____  5.  Bring all medications with you on the day of surgery if instructed.   __X__  6.  Notify your doctor if there is any change in your medical condition      (cold, fever, infections).     Do not wear jewelry, make-up, hairpins, clips or nail polish. Do not wear lotions, powders, or perfumes.  Do not shave 48 hours prior to surgery. Men may shave face and neck. Do not bring valuables to the hospital.    [facility] is not responsible for any belongings or valuables.  Contacts, dentures/partials or body piercings may not be worn into surgery. Bring a case for your contacts,  glasses or hearing aids, a denture cup will be supplied. Leave your suitcase in the car. After surgery it may be brought to your room. For patients admitted to the hospital, discharge time is determined by your treatment team.   Patients discharged the day of surgery will not be allowed to drive home.   Please read over the following fact sheets that you were given:   MRSA Information  __X__ Take these medicines the morning of surgery with A SIP OF WATER:    1. famotidine (PEPCID)  2. gabapentin (NEURONTIN)   3. metoprolol succinate (TOPROL-XL)   4. oxybutynin (DITROPAN)  5. lisinopril (PRINIVIL,ZESTRIL)  6.  ____ Fleet Enema (as directed)   ____ Use CHG Soap/SAGE wipes as directed  __X__ Use inhalers on the day of surgery  ____ Stop metformin/Janumet/Farxiga 2 days prior to surgery    ____ Take 1/2 of usual insulin dose the night before surgery. No insulin the morning          of surgery.   ____ Stop Blood Thinners Coumadin/Plavix/Xarelto/Pleta/Pradaxa/Eliquis/Effient/Aspirin  on   Or contact your Surgeon, Cardiologist or Medical Doctor regarding  ability to stop your blood thinners  __ __ Stop Anti-inflammatories 7 days before surgery such as Advil, Ibuprofen, Motrin,  BC or Goodies Powder, Naprosyn, Naproxen, Aleve, Aspirin    __ __ Stop all herbal supplements, fish oil or vitamin E until after surgery.    ____ Bring C-Pap to the hospital.

## 2018-11-29 DIAGNOSIS — I739 Peripheral vascular disease, unspecified: Secondary | ICD-10-CM | POA: Diagnosis not present

## 2018-11-29 DIAGNOSIS — L97524 Non-pressure chronic ulcer of other part of left foot with necrosis of bone: Secondary | ICD-10-CM | POA: Diagnosis not present

## 2018-11-29 DIAGNOSIS — Z89422 Acquired absence of other left toe(s): Secondary | ICD-10-CM | POA: Diagnosis not present

## 2018-11-29 DIAGNOSIS — E1142 Type 2 diabetes mellitus with diabetic polyneuropathy: Secondary | ICD-10-CM | POA: Diagnosis not present

## 2018-11-29 MED ORDER — CLINDAMYCIN PHOSPHATE 300 MG/50ML IV SOLN
300.0000 mg | Freq: Once | INTRAVENOUS | Status: AC
  Administered 2018-11-30: 300 mg via INTRAVENOUS

## 2018-11-30 ENCOUNTER — Encounter
Admission: RE | Disposition: A | Discharge status: Home / Self Care | Payer: Self-pay | Source: Home / Self Care | Attending: Vascular Surgery

## 2018-11-30 ENCOUNTER — Inpatient Hospital Stay
Admission: RE | Admit: 2018-11-30 | Discharge: 2018-12-03 | DRG: 253 | Disposition: A | Discharge status: Home / Self Care | Payer: Medicare Other | Attending: Vascular Surgery | Admitting: Vascular Surgery

## 2018-11-30 ENCOUNTER — Other Ambulatory Visit: Payer: Self-pay

## 2018-11-30 DIAGNOSIS — J449 Chronic obstructive pulmonary disease, unspecified: Secondary | ICD-10-CM | POA: Diagnosis present

## 2018-11-30 DIAGNOSIS — I70262 Atherosclerosis of native arteries of extremities with gangrene, left leg: Secondary | ICD-10-CM | POA: Diagnosis present

## 2018-11-30 DIAGNOSIS — Z87891 Personal history of nicotine dependence: Secondary | ICD-10-CM

## 2018-11-30 DIAGNOSIS — E11621 Type 2 diabetes mellitus with foot ulcer: Secondary | ICD-10-CM | POA: Diagnosis present

## 2018-11-30 DIAGNOSIS — Z7984 Long term (current) use of oral hypoglycemic drugs: Secondary | ICD-10-CM | POA: Diagnosis not present

## 2018-11-30 DIAGNOSIS — K5900 Constipation, unspecified: Secondary | ICD-10-CM | POA: Diagnosis present

## 2018-11-30 DIAGNOSIS — I70269 Atherosclerosis of native arteries of extremities with gangrene, unspecified extremity: Secondary | ICD-10-CM

## 2018-11-30 DIAGNOSIS — Z7902 Long term (current) use of antithrombotics/antiplatelets: Secondary | ICD-10-CM | POA: Diagnosis not present

## 2018-11-30 DIAGNOSIS — Z833 Family history of diabetes mellitus: Secondary | ICD-10-CM

## 2018-11-30 DIAGNOSIS — I70245 Atherosclerosis of native arteries of left leg with ulceration of other part of foot: Secondary | ICD-10-CM | POA: Diagnosis not present

## 2018-11-30 DIAGNOSIS — E785 Hyperlipidemia, unspecified: Secondary | ICD-10-CM | POA: Diagnosis present

## 2018-11-30 DIAGNOSIS — I70299 Other atherosclerosis of native arteries of extremities, unspecified extremity: Secondary | ICD-10-CM | POA: Diagnosis present

## 2018-11-30 DIAGNOSIS — E1165 Type 2 diabetes mellitus with hyperglycemia: Secondary | ICD-10-CM | POA: Diagnosis not present

## 2018-11-30 DIAGNOSIS — Z79899 Other long term (current) drug therapy: Secondary | ICD-10-CM | POA: Diagnosis not present

## 2018-11-30 DIAGNOSIS — Z882 Allergy status to sulfonamides status: Secondary | ICD-10-CM | POA: Diagnosis not present

## 2018-11-30 DIAGNOSIS — I70249 Atherosclerosis of native arteries of left leg with ulceration of unspecified site: Secondary | ICD-10-CM | POA: Diagnosis not present

## 2018-11-30 DIAGNOSIS — E1142 Type 2 diabetes mellitus with diabetic polyneuropathy: Secondary | ICD-10-CM | POA: Diagnosis present

## 2018-11-30 DIAGNOSIS — E1152 Type 2 diabetes mellitus with diabetic peripheral angiopathy with gangrene: Principal | ICD-10-CM | POA: Diagnosis present

## 2018-11-30 DIAGNOSIS — L97909 Non-pressure chronic ulcer of unspecified part of unspecified lower leg with unspecified severity: Secondary | ICD-10-CM

## 2018-11-30 DIAGNOSIS — I10 Essential (primary) hypertension: Secondary | ICD-10-CM | POA: Diagnosis present

## 2018-11-30 DIAGNOSIS — K219 Gastro-esophageal reflux disease without esophagitis: Secondary | ICD-10-CM | POA: Diagnosis present

## 2018-11-30 DIAGNOSIS — Z880 Allergy status to penicillin: Secondary | ICD-10-CM

## 2018-11-30 DIAGNOSIS — M19011 Primary osteoarthritis, right shoulder: Secondary | ICD-10-CM

## 2018-11-30 DIAGNOSIS — Z7982 Long term (current) use of aspirin: Secondary | ICD-10-CM | POA: Diagnosis not present

## 2018-11-30 DIAGNOSIS — I743 Embolism and thrombosis of arteries of the lower extremities: Secondary | ICD-10-CM | POA: Diagnosis present

## 2018-11-30 DIAGNOSIS — E119 Type 2 diabetes mellitus without complications: Secondary | ICD-10-CM | POA: Diagnosis not present

## 2018-11-30 DIAGNOSIS — T82856A Stenosis of peripheral vascular stent, initial encounter: Secondary | ICD-10-CM | POA: Diagnosis present

## 2018-11-30 DIAGNOSIS — E1151 Type 2 diabetes mellitus with diabetic peripheral angiopathy without gangrene: Secondary | ICD-10-CM | POA: Diagnosis not present

## 2018-11-30 DIAGNOSIS — B965 Pseudomonas (aeruginosa) (mallei) (pseudomallei) as the cause of diseases classified elsewhere: Secondary | ICD-10-CM | POA: Diagnosis present

## 2018-11-30 DIAGNOSIS — Z89422 Acquired absence of other left toe(s): Secondary | ICD-10-CM | POA: Diagnosis not present

## 2018-11-30 DIAGNOSIS — L97529 Non-pressure chronic ulcer of other part of left foot with unspecified severity: Secondary | ICD-10-CM | POA: Diagnosis not present

## 2018-11-30 DIAGNOSIS — L97521 Non-pressure chronic ulcer of other part of left foot limited to breakdown of skin: Secondary | ICD-10-CM | POA: Diagnosis present

## 2018-11-30 DIAGNOSIS — Z95828 Presence of other vascular implants and grafts: Secondary | ICD-10-CM

## 2018-11-30 DIAGNOSIS — M19012 Primary osteoarthritis, left shoulder: Secondary | ICD-10-CM

## 2018-11-30 DIAGNOSIS — I739 Peripheral vascular disease, unspecified: Secondary | ICD-10-CM | POA: Diagnosis not present

## 2018-11-30 HISTORY — PX: LOWER EXTREMITY ANGIOGRAPHY: CATH118251

## 2018-11-30 LAB — GLUCOSE, CAPILLARY
GLUCOSE-CAPILLARY: 213 mg/dL — AB (ref 70–99)
GLUCOSE-CAPILLARY: 259 mg/dL — AB (ref 70–99)
Glucose-Capillary: 173 mg/dL — ABNORMAL HIGH (ref 70–99)
Glucose-Capillary: 136 mg/dL — ABNORMAL HIGH (ref 70–99)
Glucose-Capillary: 244 mg/dL — ABNORMAL HIGH (ref 70–99)

## 2018-11-30 LAB — MRSA PCR SCREENING: MRSA by PCR: NEGATIVE

## 2018-11-30 SURGERY — LOWER EXTREMITY ANGIOGRAPHY
Anesthesia: Moderate Sedation | Site: Leg Lower | Laterality: Left

## 2018-11-30 MED ORDER — INSULIN ASPART 100 UNIT/ML ~~LOC~~ SOLN
0.0000 [IU] | Freq: Three times a day (TID) | SUBCUTANEOUS | Status: DC
  Administered 2018-11-30 – 2018-12-01 (×2): 5 [IU] via SUBCUTANEOUS
  Administered 2018-12-02: 3 [IU] via SUBCUTANEOUS
  Administered 2018-12-02: 5 [IU] via SUBCUTANEOUS
  Administered 2018-12-02: 2 [IU] via SUBCUTANEOUS
  Administered 2018-12-03: 3 [IU] via SUBCUTANEOUS
  Filled 2018-11-30 (×6): qty 1

## 2018-11-30 MED ORDER — ONDANSETRON HCL 4 MG/2ML IJ SOLN: 4.0000 mg | Freq: Four times a day (QID) | INTRAMUSCULAR | Status: DC | PRN

## 2018-11-30 MED ORDER — IOHEXOL 300 MG/ML  SOLN
INTRAMUSCULAR | Status: DC | PRN
  Administered 2018-11-30: 35 mL via INTRAVENOUS

## 2018-11-30 MED ORDER — METHYLPREDNISOLONE SODIUM SUCC 125 MG IJ SOLR: 125.0000 mg | Freq: Once | INTRAMUSCULAR | Status: DC | PRN

## 2018-11-30 MED ORDER — TRAMADOL HCL 50 MG PO TABS: 50.0000 mg | ORAL_TABLET | Freq: Two times a day (BID) | ORAL | Status: DC

## 2018-11-30 MED ORDER — MIDAZOLAM HCL 2 MG/2ML IJ SOLN
INTRAMUSCULAR | Status: DC | PRN
  Administered 2018-11-30: 2 mg via INTRAVENOUS
  Administered 2018-11-30: 1 mg via INTRAVENOUS

## 2018-11-30 MED ORDER — FAMOTIDINE 20 MG PO TABS: 20.0000 mg | ORAL_TABLET | Freq: Every day | ORAL | Status: DC | PRN

## 2018-11-30 MED ORDER — OXYBUTYNIN CHLORIDE 5 MG PO TABS
5.0000 mg | ORAL_TABLET | Freq: Two times a day (BID) | ORAL | Status: DC
  Administered 2018-12-02: 5 mg via ORAL
  Filled 2018-11-30 (×4): qty 1

## 2018-11-30 MED ORDER — MIDAZOLAM HCL 2 MG/ML PO SYRP: 8.0000 mg | ORAL_SOLUTION | Freq: Once | ORAL | Status: DC | PRN

## 2018-11-30 MED ORDER — MAGNESIUM HYDROXIDE 400 MG/5ML PO SUSP
30.0000 mL | Freq: Every day | ORAL | Status: DC | PRN
  Filled 2018-11-30: qty 30

## 2018-11-30 MED ORDER — VITAMIN B-12 1000 MCG PO TABS
500.0000 ug | ORAL_TABLET | Freq: Every day | ORAL | Status: DC
  Administered 2018-12-02 – 2018-12-03 (×2): 500 ug via ORAL
  Filled 2018-11-30 (×4): qty 1

## 2018-11-30 MED ORDER — FLEET ENEMA 7-19 GM/118ML RE ENEM: 1.0000 | ENEMA | Freq: Once | RECTAL | Status: DC | PRN

## 2018-11-30 MED ORDER — CLINDAMYCIN PHOSPHATE 900 MG/50ML IV SOLN
900.0000 mg | Freq: Once | INTRAVENOUS | Status: AC
  Administered 2018-12-01: 900 mg via INTRAVENOUS
  Filled 2018-11-30: qty 50

## 2018-11-30 MED ORDER — CLINDAMYCIN PHOSPHATE 300 MG/50ML IV SOLN
INTRAVENOUS | Status: AC
  Filled 2018-11-30: qty 50

## 2018-11-30 MED ORDER — HYDROCODONE-ACETAMINOPHEN 5-325 MG PO TABS: 1.0000 | ORAL_TABLET | Freq: Four times a day (QID) | ORAL | Status: DC | PRN

## 2018-11-30 MED ORDER — ASPIRIN EC 81 MG PO TBEC
81.0000 mg | DELAYED_RELEASE_TABLET | Freq: Every day | ORAL | Status: DC
  Administered 2018-12-02 – 2018-12-03 (×2): 81 mg via ORAL
  Filled 2018-11-30 (×2): qty 1

## 2018-11-30 MED ORDER — DEXTROSE-NACL 5-0.9 % IV SOLN
INTRAVENOUS | Status: DC
  Administered 2018-11-30: 13:00:00 via INTRAVENOUS

## 2018-11-30 MED ORDER — FENTANYL CITRATE (PF) 100 MCG/2ML IJ SOLN
INTRAMUSCULAR | Status: DC | PRN
  Administered 2018-11-30: 25 ug via INTRAVENOUS
  Administered 2018-11-30: 50 ug via INTRAVENOUS

## 2018-11-30 MED ORDER — SORBITOL 70 % SOLN
30.0000 mL | Freq: Every day | Status: DC | PRN
  Filled 2018-11-30: qty 30

## 2018-11-30 MED ORDER — ASPIRIN EC 81 MG PO TBEC: 81.0000 mg | DELAYED_RELEASE_TABLET | Freq: Every day | ORAL | Status: DC

## 2018-11-30 MED ORDER — FENTANYL CITRATE (PF) 100 MCG/2ML IJ SOLN
INTRAMUSCULAR | Status: AC
  Filled 2018-11-30: qty 2

## 2018-11-30 MED ORDER — MIDAZOLAM HCL 5 MG/5ML IJ SOLN
INTRAMUSCULAR | Status: AC
  Filled 2018-11-30: qty 5

## 2018-11-30 MED ORDER — LISINOPRIL 10 MG PO TABS
5.0000 mg | ORAL_TABLET | Freq: Every day | ORAL | Status: DC
  Administered 2018-12-02: 5 mg via ORAL
  Filled 2018-11-30 (×2): qty 1

## 2018-11-30 MED ORDER — HYDROCODONE-ACETAMINOPHEN 5-325 MG PO TABS
1.0000 | ORAL_TABLET | ORAL | Status: DC | PRN
  Administered 2018-11-30: 1 via ORAL
  Filled 2018-11-30: qty 1

## 2018-11-30 MED ORDER — SODIUM CHLORIDE 0.9 % IV SOLN
INTRAVENOUS | Status: DC
  Administered 2018-11-30 – 2018-12-03 (×5): via INTRAVENOUS

## 2018-11-30 MED ORDER — INSULIN ASPART 100 UNIT/ML ~~LOC~~ SOLN
0.0000 [IU] | Freq: Every day | SUBCUTANEOUS | Status: DC
  Administered 2018-11-30: 3 [IU] via SUBCUTANEOUS
  Administered 2018-12-01 – 2018-12-02 (×2): 2 [IU] via SUBCUTANEOUS
  Filled 2018-11-30 (×4): qty 1

## 2018-11-30 MED ORDER — ONDANSETRON HCL 4 MG PO TABS: 4.0000 mg | ORAL_TABLET | Freq: Four times a day (QID) | ORAL | Status: DC | PRN

## 2018-11-30 MED ORDER — HEPARIN (PORCINE) IN NACL 1000-0.9 UT/500ML-% IV SOLN
INTRAVENOUS | Status: AC
  Filled 2018-11-30: qty 500

## 2018-11-30 MED ORDER — CALCIUM CARBONATE-VITAMIN D 500-200 MG-UNIT PO TABS
ORAL_TABLET | Freq: Every day | ORAL | Status: DC
  Administered 2018-12-02 – 2018-12-03 (×2): 1 via ORAL
  Filled 2018-11-30 (×2): qty 1

## 2018-11-30 MED ORDER — GLYBURIDE 2.5 MG PO TABS
2.5000 mg | ORAL_TABLET | Freq: Two times a day (BID) | ORAL | Status: DC
  Administered 2018-11-30 – 2018-12-03 (×5): 2.5 mg via ORAL
  Filled 2018-11-30 (×7): qty 1

## 2018-11-30 MED ORDER — TRAMADOL HCL 50 MG PO TABS
50.0000 mg | ORAL_TABLET | Freq: Four times a day (QID) | ORAL | Status: DC | PRN
  Administered 2018-12-02 – 2018-12-03 (×3): 50 mg via ORAL
  Filled 2018-11-30 (×3): qty 1

## 2018-11-30 MED ORDER — ALTEPLASE 2 MG IJ SOLR
INTRAMUSCULAR | Status: DC | PRN
  Administered 2018-11-30: 4 mg

## 2018-11-30 MED ORDER — PRAVASTATIN SODIUM 20 MG PO TABS
10.0000 mg | ORAL_TABLET | Freq: Every day | ORAL | Status: DC
  Administered 2018-11-30 – 2018-12-02 (×3): 10 mg via ORAL
  Filled 2018-11-30 (×4): qty 1

## 2018-11-30 MED ORDER — SODIUM CHLORIDE 0.9 % IV SOLN
INTRAVENOUS | Status: DC
  Administered 2018-11-30: 09:00:00 via INTRAVENOUS

## 2018-11-30 MED ORDER — METOPROLOL SUCCINATE ER 100 MG PO TB24
100.0000 mg | ORAL_TABLET | Freq: Every day | ORAL | Status: DC
  Administered 2018-12-01 – 2018-12-02 (×2): 100 mg via ORAL
  Filled 2018-11-30: qty 2
  Filled 2018-11-30 (×2): qty 1

## 2018-11-30 MED ORDER — GABAPENTIN 100 MG PO CAPS
200.0000 mg | ORAL_CAPSULE | Freq: Three times a day (TID) | ORAL | Status: DC
  Administered 2018-11-30 – 2018-12-01 (×4): 200 mg via ORAL
  Filled 2018-11-30 (×4): qty 2

## 2018-11-30 MED ORDER — DOCUSATE SODIUM 100 MG PO CAPS: 100.0000 mg | ORAL_CAPSULE | Freq: Every day | ORAL | Status: DC | PRN

## 2018-11-30 MED ORDER — ACETAMINOPHEN 325 MG PO TABS: 650.0000 mg | ORAL_TABLET | Freq: Four times a day (QID) | ORAL | Status: DC | PRN

## 2018-11-30 MED ORDER — ACETAMINOPHEN 650 MG RE SUPP: 650.0000 mg | Freq: Four times a day (QID) | RECTAL | Status: DC | PRN

## 2018-11-30 MED ORDER — FAMOTIDINE 20 MG PO TABS: 40.0000 mg | ORAL_TABLET | Freq: Once | ORAL | Status: DC | PRN

## 2018-11-30 MED ORDER — HYDROMORPHONE HCL 1 MG/ML IJ SOLN: 1.0000 mg | Freq: Once | INTRAMUSCULAR | Status: DC | PRN

## 2018-11-30 MED ORDER — ONDANSETRON HCL 4 MG/2ML IJ SOLN
4.0000 mg | Freq: Four times a day (QID) | INTRAMUSCULAR | Status: DC | PRN
  Administered 2018-12-01: 4 mg via INTRAVENOUS

## 2018-11-30 MED ORDER — HEPARIN SODIUM (PORCINE) 1000 UNIT/ML IJ SOLN
INTRAMUSCULAR | Status: AC
  Filled 2018-11-30: qty 1

## 2018-11-30 MED ORDER — OMEGA-3-ACID ETHYL ESTERS 1 G PO CAPS
1.0000 g | ORAL_CAPSULE | Freq: Every day | ORAL | Status: DC
  Administered 2018-12-02 – 2018-12-03 (×2): 1 g via ORAL
  Filled 2018-11-30 (×2): qty 1

## 2018-11-30 MED ORDER — DOCUSATE SODIUM 100 MG PO CAPS
100.0000 mg | ORAL_CAPSULE | Freq: Two times a day (BID) | ORAL | Status: DC
  Administered 2018-11-30 – 2018-12-03 (×5): 100 mg via ORAL
  Filled 2018-11-30 (×5): qty 1

## 2018-11-30 MED ORDER — LIDOCAINE-EPINEPHRINE (PF) 1 %-1:200000 IJ SOLN
INTRAMUSCULAR | Status: AC
  Filled 2018-11-30: qty 30

## 2018-11-30 MED ORDER — MOMETASONE FURO-FORMOTEROL FUM 100-5 MCG/ACT IN AERO
2.0000 | INHALATION_SPRAY | Freq: Two times a day (BID) | RESPIRATORY_TRACT | Status: DC
  Administered 2018-11-30 – 2018-12-03 (×6): 2 via RESPIRATORY_TRACT
  Filled 2018-11-30: qty 8.8

## 2018-11-30 MED ORDER — SODIUM CHLORIDE 0.9 % IV SOLN
INTRAVENOUS | Status: AC | PRN
  Administered 2018-11-30: 500 mL via INTRAVENOUS

## 2018-11-30 MED ORDER — HYDROCODONE-ACETAMINOPHEN 5-325 MG PO TABS
1.0000 | ORAL_TABLET | Freq: Four times a day (QID) | ORAL | Status: DC | PRN
  Administered 2018-11-30 – 2018-12-01 (×3): 1 via ORAL
  Filled 2018-11-30 (×4): qty 1

## 2018-11-30 MED ORDER — ALUM & MAG HYDROXIDE-SIMETH 200-200-20 MG/5ML PO SUSP
30.0000 mL | Freq: Four times a day (QID) | ORAL | Status: DC | PRN
  Filled 2018-11-30: qty 30

## 2018-11-30 MED ORDER — DIPHENHYDRAMINE HCL 50 MG/ML IJ SOLN: 50.0000 mg | Freq: Once | INTRAMUSCULAR | Status: DC | PRN

## 2018-11-30 MED ORDER — DIPHENHYDRAMINE HCL 25 MG PO CAPS
25.0000 mg | ORAL_CAPSULE | Freq: Three times a day (TID) | ORAL | Status: DC | PRN
  Filled 2018-11-30: qty 1

## 2018-11-30 SURGICAL SUPPLY — 9 items
CATH BEACON 5 .035 65 KMP TIP (CATHETERS) ×3 IMPLANT
CATH PIG 70CM (CATHETERS) ×3 IMPLANT
DEVICE STARCLOSE SE CLOSURE (Vascular Products) ×3 IMPLANT
GLIDEWIRE ADV .035X260CM (WIRE) ×3 IMPLANT
PACK ANGIOGRAPHY (CUSTOM PROCEDURE TRAY) ×3 IMPLANT
SHEATH BRITE TIP 5FRX11 (SHEATH) ×3 IMPLANT
SHEATH DESTIN RDC 6FR 45 (SHEATH) ×3 IMPLANT
TUBING CONTRAST HIGH PRESS 72 (TUBING) ×3 IMPLANT
WIRE J 3MM .035X145CM (WIRE) ×3 IMPLANT

## 2018-11-30 NOTE — H&P (Signed)
[facility] NAME: [name]    MR#:  [id number]  DATE OF BIRTH:  03/06/1951  DATE OF ADMISSION:  11/30/2018  PRIMARY CARE PHYSICIAN: [name], MD   REQUESTING/REFERRING PHYSICIAN:   CHIEF COMPLAINT:   Left leg pain  HISTORY OF PRESENT ILLNESS: [name]  is a 68 y.o. male with a known history per below, admitted to hospital on the vascular service for peripheral vascular disease, plans for left lower extremity angiogram on tomorrow, hospitalist service asked to consult for medical comanagement.  PAST MEDICAL HISTORY:   Past Medical History:  Diagnosis Date  . Cholecystitis, chronic   . Cholelithiasis   . COPD (chronic obstructive pulmonary disease) ([place])   . Diabetes mellitus without complication ([place])   . Dyspnea   . Fatty liver 2008  . Fracture of clavicle 1992   left   . Fracture of ribs, multiple 1992   3 ribs  . GERD (gastroesophageal reflux disease)   . Hyperlipidemia   . Hypertension   . Paroxysmal SVT (supraventricular tachycardia) ([place])   . Pelvis fracture ([place]) 1992  . Peripheral vascular disease ([place])   . Pleurisy   . Pleurisy   . Seasonal allergies     PAST SURGICAL HISTORY:  Past Surgical History:  Procedure Laterality Date  . AMPUTATION TOE Left 02/25/2017   Procedure: AMPUTATION TOE-LEFT 2ND MPJ;  Surgeon: Samara Deist, DPM;  Location: [facility] ORS;  Service: Podiatry;  Laterality: Left;  . AMPUTATION TOE Left 10/20/2018   Procedure: TOE MPJ T2 LEFT;  Surgeon: Samara Deist, DPM;  Location: [facility] ORS;  Service: Podiatry;  Laterality: Left;  . BACK SURGERY  2008  . CHOLECYSTECTOMY    . COLONOSCOPY WITH PROPOFOL N/A 08/15/2017   Procedure: COLONOSCOPY WITH PROPOFOL;  Surgeon: Lollie Sails, MD;  Location: [facility];  Service: Endoscopy;  Laterality: N/A;  . ENDARTERECTOMY Right 05/12/2017   Procedure: ENDARTERECTOMY CAROTID;  Surgeon: [name], MD;  Location: [facility] ORS;  Service:  Vascular;  Laterality: Right;  . ERCP    . FOOT SURGERY Right    x 3  . LOWER EXTREMITY ANGIOGRAPHY Left 10/30/2018   Procedure: LOWER EXTREMITY ANGIOGRAPHY;  Surgeon: [name], MD;  Location: [facility];  Service: Cardiovascular;  Laterality: Left;    SOCIAL HISTORY:  Social History   Tobacco Use  . Smoking status: Former Smoker    Last attempt to quit: 02/24/1997    Years since quitting: 21.7  . Smokeless tobacco: Never Used  Substance Use Topics  . Alcohol use: No    FAMILY HISTORY:  Family History  Problem Relation Age of Onset  . Diabetes Sister     DRUG ALLERGIES:  Allergies  Allergen Reactions  . Penicillins Rash and Other (See Comments)    Rash in and around the mouth Has patient had a PCN reaction causing immediate rash, facial/tongue/throat swelling, SOB or lightheadedness with hypotension: Yes Has patient had a PCN reaction causing severe rash involving mucus membranes or skin necrosis: Yes Has patient had a PCN reaction that required hospitalization: No Has patient had a PCN reaction occurring within the last 10 years: Yes If all of the above answers are "NO", then may proceed with Cephalosporin use.   . Bactrim [Sulfamethoxazole-Trimethoprim] Other (See Comments)    Mouth sores, Sores develop in mouth    REVIEW OF SYSTEMS:   CONSTITUTIONAL: No fever, fatigue or weakness.  EYES: No blurred or double  vision.  EARS, NOSE, AND THROAT: No tinnitus or ear pain.  RESPIRATORY: No cough, shortness of breath, wheezing or hemoptysis.  CARDIOVASCULAR: No chest pain, orthopnea, edema.  GASTROINTESTINAL: No nausea, vomiting, diarrhea or abdominal pain.  GENITOURINARY: No dysuria, hematuria.  ENDOCRINE: No polyuria, nocturia,  HEMATOLOGY: No anemia, easy bruising or bleeding SKIN: No rash or lesion. MUSCULOSKELETAL: Left leg pain  nEUROLOGIC: No tingling, numbness, weakness.  PSYCHIATRY: No anxiety or depression.   MEDICATIONS AT HOME:  Prior to  Admission medications   Medication Sig Start Date End Date Taking? Authorizing Provider  aspirin EC 81 MG tablet Take 81 mg by mouth daily.   Yes [provider]  Calcium Carbonate-Vitamin D (CALCIUM PLUS VITAMIN D PO) Take 1 tablet by mouth daily.   Yes [provider]  clopidogrel (PLAVIX) 75 MG tablet TAKE 1 TABLET BY MOUTH ONCE DAILY Patient taking differently: Take 75 mg by mouth daily.  08/21/18  Yes [name], MD  docusate sodium (COLACE) 100 MG capsule Take 100 mg by mouth daily as needed.    Yes [provider]  famotidine (PEPCID) 20 MG tablet Take 20 mg by mouth daily as needed for heartburn or indigestion.   Yes [provider]  gabapentin (NEURONTIN) 100 MG capsule TAKE 2 CAPSULES BY MOUTH UP TO THREE TIMES DAILY Patient taking differently: Take 100 mg by mouth 2 (two) times daily.  11/03/18  Yes Ronnell Freshwater, NP  glyBURIDE (DIABETA) 2.5 MG tablet Take 1 tablet (2.5 mg total) by mouth 2 (two) times daily with a meal. 03/06/18  Yes Boscia, Heather E, NP  lisinopril (PRINIVIL,ZESTRIL) 5 MG tablet Take 1 tablet (5 mg total) by mouth daily. 10/23/18  Yes Scarboro, Audie Clear, NP  lovastatin (MEVACOR) 10 MG tablet Take 1 tab  Po QHS Patient taking differently: Take 10 mg by mouth at bedtime.  05/22/18  Yes Ronnell Freshwater, NP  metoprolol succinate (TOPROL-XL) 100 MG 24 hr tablet Take 1 tablet (100 mg total) by mouth daily. Take with or immediately following a meal. 07/12/15  Yes Wieting, Richard, MD  mometasone-formoterol (DULERA) 100-5 MCG/ACT AERO Inhale 2 puffs into the lungs 2 (two) times daily as needed for wheezing.   Yes [provider]  Naphazoline-Pheniramine (ALLERGY EYE OP) Place 1 drop into both eyes daily as needed (for allergies).   Yes [provider]  Omega-3 Fatty Acids (FISH OIL PO) Take 1 capsule by mouth daily.   Yes [provider]  oxybutynin (DITROPAN) 5 MG tablet Take 1 tablet (5 mg total) by mouth 2  (two) times daily. 10/26/18  Yes Boscia, Greer Ee, NP  traMADol (ULTRAM) 50 MG tablet Take 1 tablet (50 mg total) by mouth every 6 (six) hours as needed for moderate pain or severe pain. Patient taking differently: Take 50 mg by mouth 2 (two) times daily.  11/01/18  Yes Stegmayer, Janalyn Harder, PA-C  vitamin B-12 (CYANOCOBALAMIN) 500 MCG tablet Take 500 mcg by mouth daily.    Yes [provider]      PHYSICAL EXAMINATION:   VITAL SIGNS: Blood pressure 109/77, pulse 85, temperature (!) 97.2 F (36.2 C), temperature source Oral, resp. rate 18, height 5\' 10"  (1.778 m), weight 78.5 kg, SpO2 95 %.  GENERAL:  68 y.o.-year-old patient lying in the bed with no acute distress.  Frail-appearing EYES: Pupils equal, round, reactive to light and accommodation. No scleral icterus. Extraocular muscles intact.  HEENT: Head atraumatic, normocephalic. Oropharynx and nasopharynx clear.  NECK:  Supple, no jugular venous distention. No thyroid enlargement, no tenderness.  LUNGS: Normal breath sounds bilaterally, no wheezing, rales,rhonchi or crepitation. No use of accessory muscles of respiration.  CARDIOVASCULAR: S1, S2 normal. No murmurs, rubs, or gallops.  ABDOMEN: Soft, nontender, nondistended. Bowel sounds present. No organomegaly or mass.  EXTREMITIES: No pedal edema, cyanosis, or clubbing.  NEUROLOGIC: Cranial nerves II through XII are intact. Muscle strength 5/5 in all extremities. Sensation intact. Gait not checked.  PSYCHIATRIC: The patient is alert and oriented x 3.  SKIN: No obvious rash, lesion, or ulcer.  Left foot dressing in place  LABORATORY PANEL:   CBC No results for input(s): WBC, HGB, HCT, PLT, MCV, MCH, MCHC, RDW, LYMPHSABS, MONOABS, EOSABS, BASOSABS, BANDABS in the last 168 hours.  Invalid input(s): NEUTRABS, BANDSABD ------------------------------------------------------------------------------------------------------------------  Chemistries  Recent Labs  Lab 11/28/18 0800   BUN 26*  CREATININE 1.08   ------------------------------------------------------------------------------------------------------------------ estimated creatinine clearance is 70.4 mL/min (by C-G formula based on SCr of 1.08 mg/dL). ------------------------------------------------------------------------------------------------------------------ No results for input(s): TSH, T4TOTAL, T3FREE, THYROIDAB in the last 72 hours.  Invalid input(s): FREET3   Coagulation profile No results for input(s): INR, PROTIME in the last 168 hours. ------------------------------------------------------------------------------------------------------------------- No results for input(s): DDIMER in the last 72 hours. -------------------------------------------------------------------------------------------------------------------  Cardiac Enzymes No results for input(s): CKMB, TROPONINI, MYOGLOBIN in the last 168 hours.  Invalid input(s): CK ------------------------------------------------------------------------------------------------------------------ Invalid input(s): POCBNP  ---------------------------------------------------------------------------------------------------------------  Urinalysis    Component Value Date/Time   COLORURINE YELLOW (A) 07/11/2015 0831   APPEARANCEUR Clear 06/06/2018 1029   LABSPEC 1.012 07/11/2015 0831   PHURINE 6.0 07/11/2015 0831   GLUCOSEU 1+ (A) 06/06/2018 1029   HGBUR 1+ (A) 07/11/2015 0831   BILIRUBINUR Negative 06/06/2018 1029   KETONESUR NEGATIVE 07/11/2015 0831   PROTEINUR Negative 06/06/2018 1029   PROTEINUR NEGATIVE 07/11/2015 0831   NITRITE Negative 06/06/2018 1029   NITRITE NEGATIVE 07/11/2015 0831   LEUKOCYTESUR Negative 06/06/2018 1029     RADIOLOGY: No results found.  EKG: Orders placed or performed during the hospital encounter of 10/20/18  . EKG 12 lead  . EKG 12 lead    IMPRESSION AND PLAN: *Chronic diabetes mellitus type  2 Continue glyburide, sliding scale insulin with Accu-Cheks per routine, statin therapy  *COPD without exacerbation Stable Breathing treatments PRN  *Chronic GERD without esophagitis Continue Pepcid  *Chronic benign essential hypertension Stable Continue lisinopril, Lopressor  *Chronic hyperlipidemia, unspecified Continue statin therapy  *Chronic peripheral vascular disease For left lower extremity angiogram by vascular surgery on tomorrow  All the records are reviewed and case discussed with ED provider. Management plans discussed with the patient, family and they are in agreement.  CODE STATUS:full    Code Status Orders  (From admission, onward)         Start     Ordered   11/30/18 1304  Full code  Continuous     11/30/18 1303        Code Status History    Date Active Date Inactive Code Status Order ID Comments User Context   10/30/2018 7619 10/30/2018 1944 Full Code [id number]  [name], MD Inpatient   10/20/2018 1332 10/20/2018 1732 Full Code [id number]  Samara Deist, [place] Inpatient   05/12/2017 1639 05/13/2017 1840 Full Code [id number]  [name], MD Inpatient   02/25/2017 1450 02/25/2017 1848 Full Code [id number]  Samara Deist, [place] Inpatient   07/10/2015 1430 07/12/2015 1354 Full Code [id number]  Aldean Jewett, MD Inpatient       TOTAL TIME TAKING  CARE OF THIS PATIENT: 40 minutes.    Avel Peace Cambry Spampinato M.D on 11/30/2018   Between 7am to 6pm - Pager - [phone]  After 6pm go to [email or web address] - password [email or web address] [facility]  Office  [phone]  CC: Primary care physician; [name], MD   Note: This dictation was prepared with Dragon dictation along with smaller phrase technology. Any transcriptional errors that result from this process are unintentional.

## 2018-11-30 NOTE — Op Note (Signed)
[facility]  Percutaneous Study/Intervention Procedural Note   Date of Surgery: 11/30/2018  Surgeon(s): ,    Assistants: none  Pre-operative Diagnosis: PAD with ulceration   Post-operative diagnosis:  Same  Procedure(s) Performed:             1.  Ultrasound guidance for vascular access right femoral artery             2.  Catheter placement into left profunda femorus artery from right femoral approach             3.  Aortogram and selective left lower extremity angiogram             4.  Catheter directed thrombolytic therapy with 4 mg of tpa to the left common femoral and profunda femorus artery             5.  StarClose closure device right femoral artery  EBL: 10 cc  Contrast: 35 cc  Fluoro Time: 18 minutes  Moderate Conscious Sedation Time: approximately 35 minutes using 3 mg of Versed and 75 mcg of Fentanyl              Indications:  Patient is a 68 y.o.male with nonhealing ulceration and rest pain of the left foot. The patient has noninvasive study showing markedly reduced perfusion with occlusion of his previous left lower extremity interventions. The patient is brought in for angiography for further evaluation and potential treatment.  Due to the limb threatening nature of the situation, angiogram was performed for attempted limb salvage. The patient is aware that if the procedure fails, amputation would be expected.  The patient also understands that even with successful revascularization, amputation may still be required due to the severity of the situation.  Risks and benefits are discussed and informed consent is obtained.   Procedure:  The patient was identified and appropriate procedural time out was performed.  The patient was then placed supine on the table and prepped and draped in the usual sterile fashion. Moderate conscious sedation was administered during a face to face encounter with the patient throughout the procedure with my  supervision of the RN administering medicines and monitoring the patient's vital signs, pulse oximetry, telemetry and mental status throughout from the start of the procedure until the patient was taken to the recovery room. Ultrasound was used to evaluate the right common femoral artery.  It was patent .  A digital ultrasound image was acquired.  A Seldinger needle was used to access the right common femoral artery under direct ultrasound guidance and a permanent image was performed.  A 0.035 J wire was advanced without resistance and a 5Fr sheath was placed.  Pigtail catheter was placed into the aorta and an AP aortogram was performed. This demonstrated normal renal arteries and normal aorta and iliac segments without significant stenosis. I then crossed the aortic bifurcation and advanced to the left femoral head. Selective left lower extremity angiogram was then performed. This demonstrated flush occlusion of the left SFA.  There was a significant appearing thrombus or plaque in the proximal profunda femoris artery creating a greater than 80% stenosis with some disease in the common femoral artery as well.  There was reconstitution of the below-knee popliteal artery/anterior tibial artery distally although distal opacification was fairly poor.  This did appear to be continuous distally through the anterior tibial artery to the foot. It was felt that it was in the patient's best interest to proceed with intervention after  these images to avoid a second procedure and a larger amount of contrast and fluoroscopy based off of the findings from the initial angiogram. The patient was systemically heparinized and a 6 [place] destination sheath was then placed over the [employer] wire. I then used a Kumpe catheter and the advantage wire to navigate down in the common femoral artery and proximal profunda femoris artery and instilled 4 mg of TPA in a power pulse spray fashion.  I initially had planned to place another  4 mg of TPA into the SFA but the flush occlusion could not be reentered despite multiple different angles and images and attempt to cannulate the occluded SFA and SFA stents.  Particularly given the significant femoral bifurcation disease the patient will need a femoral endarterectomy to remove this, and I felt trying to address his SFA occlusion at that time would be a more reasonable option. I elected to terminate the procedure. The sheath was removed and StarClose closure device was deployed in the right femoral artery with excellent hemostatic result. The patient was taken to the recovery room in stable condition having tolerated the procedure well.  Findings:               Aortogram:  Normal renal arteries, normal aorta and iliac arteries without hemodynamically significant stenosis             Left lower Extremity:  Flush occlusion of the left SFA.  There was a significant appearing thrombus or plaque in the proximal profunda femoris artery creating a greater than 80% stenosis with some disease in the common femoral artery as well.  There was reconstitution of the below-knee popliteal artery/anterior tibial artery distally although distal opacification was fairly poor.  This did appear to be continuous distally through the anterior tibial artery to the foot.   Disposition: Patient was taken to the recovery room in stable condition having tolerated the procedure well.  Complications: None  Leotis Pain 11/30/2018 11:35 AM   This note was created with Dragon Medical transcription system. Any errors in dictation are purely unintentional.

## 2018-11-30 NOTE — H&P (Signed)
[facility] History & Physical Update  The patient was interviewed and re-examined.  The patient's previous History and Physical has been reviewed and is unchanged.  There is no change in the plan of care. We plan to proceed with the scheduled procedure.  Leotis Pain, MD  11/30/2018, 8:11 AM

## 2018-11-30 NOTE — Progress Notes (Signed)
Dr. Lucky Cowboy spoke with pt. Re: procedural results. Pt. Aware & agreeable verbally to surgery tomorrow. Pt. To stay in hospital overnight.

## 2018-11-30 NOTE — Anesthesia Preprocedure Evaluation (Addendum)
Anesthesia Evaluation  Patient identified by MRN, date of birth, ID band Patient awake    Reviewed: Allergy & Precautions, NPO status , Patient's Chart, lab work & pertinent test results  History of Anesthesia Complications Negative for: history of anesthetic complications  Airway Mallampati: II  TM Distance: >3 FB Neck ROM: Full    Dental  (+) Edentulous Lower, Edentulous Upper   Pulmonary neg sleep apnea, COPD, former smoker,    breath sounds clear to auscultation- rhonchi (-) wheezing      Cardiovascular hypertension, Pt. on medications + Peripheral Vascular Disease  (-) CAD, (-) Past MI, (-) Cardiac Stents and (-) CABG  Rhythm:Regular Rate:Normal - Systolic murmurs and - Diastolic murmurs    Neuro/Psych neg Seizures negative neurological ROS  negative psych ROS   GI/Hepatic Neg liver ROS, GERD  ,  Endo/Other  diabetes, Insulin Dependent  Renal/GU negative Renal ROS     Musculoskeletal  (+) Arthritis ,   Abdominal (+) - obese,   Peds  Hematology negative hematology ROS (+)   Anesthesia Other Findings Past Medical History: No date: Cholecystitis, chronic No date: Cholelithiasis No date: COPD (chronic obstructive pulmonary disease) ([facility]) No date: Diabetes mellitus without complication ([place]) No date: Dyspnea 2008: Fatty liver 1992: Fracture of clavicle     Comment:  left  1992: Fracture of ribs, multiple     Comment:  3 ribs No date: GERD (gastroesophageal reflux disease) No date: Hyperlipidemia No date: Hypertension No date: Paroxysmal SVT (supraventricular tachycardia) ([place]) 1992: Pelvis fracture ([facility]) No date: Peripheral vascular disease ([place]) No date: Pleurisy No date: Pleurisy No date: Seasonal allergies   Reproductive/Obstetrics                            Anesthesia Physical Anesthesia Plan  ASA: III  Anesthesia Plan: General   Post-op Pain Management:     Induction: Intravenous  PONV Risk Score and Plan: 1 and Ondansetron and Midazolam  Airway Management Planned: Oral ETT  Additional Equipment:   Intra-op Plan:   Post-operative Plan: Extubation in OR  Informed Consent: I have reviewed the patients History and Physical, chart, labs and discussed the procedure including the risks, benefits and alternatives for the proposed anesthesia with the patient or authorized representative who has indicated his/her understanding and acceptance.     Dental advisory given  Plan Discussed with: CRNA and Anesthesiologist  Anesthesia Plan Comments:         Anesthesia Quick Evaluation

## 2018-11-30 NOTE — Progress Notes (Signed)
Family Meeting Note  Advance Directive:yes  Today a meeting took place with the Patient.  Patient is able to participate  The following clinical team members were present during this meeting:MD  The following were discussed:Patient's diagnosis: PVD, Patient's progosis: Unable to determine and Goals for treatment: Full Code  Additional follow-up to be provided: prn  Time spent during discussion:20 minutes  Gorden Harms, MD

## 2018-11-30 NOTE — Progress Notes (Signed)
Verbal order from MD to place order for CBG checks as ACHS and pt to be on moderate sliding scale of Novolog. Pt updated.   Sherlynn Tourville [employer]

## 2018-12-01 ENCOUNTER — Encounter
Admission: RE | Disposition: A | Discharge status: Home / Self Care | Payer: Self-pay | Source: Home / Self Care | Attending: Vascular Surgery

## 2018-12-01 ENCOUNTER — Inpatient Hospital Stay: Payer: Medicare Other | Admitting: Anesthesiology

## 2018-12-01 ENCOUNTER — Encounter: Payer: Self-pay | Admitting: Vascular Surgery

## 2018-12-01 ENCOUNTER — Inpatient Hospital Stay: Payer: Medicare Other

## 2018-12-01 DIAGNOSIS — I70245 Atherosclerosis of native arteries of left leg with ulceration of other part of foot: Secondary | ICD-10-CM

## 2018-12-01 DIAGNOSIS — E1151 Type 2 diabetes mellitus with diabetic peripheral angiopathy without gangrene: Secondary | ICD-10-CM

## 2018-12-01 DIAGNOSIS — L97529 Non-pressure chronic ulcer of other part of left foot with unspecified severity: Secondary | ICD-10-CM

## 2018-12-01 HISTORY — PX: ENDARTERECTOMY FEMORAL: SHX5804

## 2018-12-01 HISTORY — PX: LOWER EXTREMITY ANGIOGRAM: SHX5508

## 2018-12-01 LAB — TYPE AND SCREEN
ABO/RH(D): A POS
Antibody Screen: NEGATIVE

## 2018-12-01 LAB — CBC
HCT: 33.5 % — ABNORMAL LOW (ref 39.0–52.0)
HEMATOCRIT: 35.9 % — AB (ref 39.0–52.0)
Hemoglobin: 12.1 g/dL — ABNORMAL LOW (ref 13.0–17.0)
Hemoglobin: 10.9 g/dL — ABNORMAL LOW (ref 13.0–17.0)
MCH: 28.9 pg (ref 26.0–34.0)
MCH: 28.2 pg (ref 26.0–34.0)
MCHC: 33.7 g/dL (ref 30.0–36.0)
MCHC: 32.5 g/dL (ref 30.0–36.0)
MCV: 85.7 fL (ref 80.0–100.0)
MCV: 86.6 fL (ref 80.0–100.0)
Platelets: 145 10*3/uL — ABNORMAL LOW (ref 150–400)
Platelets: 127 10*3/uL — ABNORMAL LOW (ref 150–400)
RBC: 4.19 MIL/uL — ABNORMAL LOW (ref 4.22–5.81)
RBC: 3.87 MIL/uL — ABNORMAL LOW (ref 4.22–5.81)
RDW: 13 % (ref 11.5–15.5)
RDW: 13.2 % (ref 11.5–15.5)
WBC: 4.9 10*3/uL (ref 4.0–10.5)
WBC: 4.7 10*3/uL (ref 4.0–10.5)
nRBC: 0 % (ref 0.0–0.2)
nRBC: 0 % (ref 0.0–0.2)

## 2018-12-01 LAB — MAGNESIUM: Magnesium: 1.9 mg/dL (ref 1.7–2.4)

## 2018-12-01 LAB — APTT: aPTT: 34 seconds (ref 24–36)

## 2018-12-01 LAB — BASIC METABOLIC PANEL
Anion gap: 6 (ref 5–15)
BUN: 23 mg/dL (ref 8–23)
CO2: 26 mmol/L (ref 22–32)
CREATININE: 1 mg/dL (ref 0.61–1.24)
Calcium: 8.4 mg/dL — ABNORMAL LOW (ref 8.9–10.3)
Chloride: 103 mmol/L (ref 98–111)
GFR calc Af Amer: >60 mL/min (ref >60)
GFR calc non Af Amer: >60 mL/min (ref >60)
Glucose, Bld: 158 mg/dL — ABNORMAL HIGH (ref 70–99)
Potassium: 4.3 mmol/L (ref 3.5–5.1)
Sodium: 135 mmol/L (ref 135–145)

## 2018-12-01 LAB — GLUCOSE, CAPILLARY
Glucose-Capillary: 113 mg/dL — ABNORMAL HIGH (ref 70–99)
Glucose-Capillary: 117 mg/dL — ABNORMAL HIGH (ref 70–99)
Glucose-Capillary: 192 mg/dL — ABNORMAL HIGH (ref 70–99)
Glucose-Capillary: 238 mg/dL — ABNORMAL HIGH (ref 70–99)
Glucose-Capillary: 227 mg/dL — ABNORMAL HIGH (ref 70–99)

## 2018-12-01 LAB — PROTIME-INR
INR: 1 (ref 0.8–1.2)
Prothrombin Time: 13.4 seconds (ref 11.4–15.2)

## 2018-12-01 SURGERY — ENDARTERECTOMY, FEMORAL
Anesthesia: General | Site: Groin | Laterality: Left

## 2018-12-01 MED ORDER — HEPARIN SODIUM (PORCINE) 1000 UNIT/ML IJ SOLN
INTRAMUSCULAR | Status: DC | PRN
  Administered 2018-12-01: 2000 [IU] via INTRAVENOUS
  Administered 2018-12-01: 5000 [IU] via INTRAVENOUS

## 2018-12-01 MED ORDER — FENTANYL CITRATE (PF) 100 MCG/2ML IJ SOLN
INTRAMUSCULAR | Status: DC | PRN
  Administered 2018-12-01 (×3): 50 ug via INTRAVENOUS

## 2018-12-01 MED ORDER — ALTEPLASE 2 MG IJ SOLR
INTRAMUSCULAR | Status: AC
  Administered 2018-12-01: 16:00:00
  Filled 2018-12-01: qty 8

## 2018-12-01 MED ORDER — MEPERIDINE HCL 50 MG/ML IJ SOLN: 6.2500 mg | INTRAMUSCULAR | Status: DC | PRN

## 2018-12-01 MED ORDER — SODIUM CHLORIDE 0.9 % IV SOLN
INTRAVENOUS | Status: DC | PRN
  Administered 2018-12-01: 50 mL via INTRAMUSCULAR

## 2018-12-01 MED ORDER — PROPOFOL 10 MG/ML IV BOLUS
INTRAVENOUS | Status: DC | PRN
  Administered 2018-12-01: 130 mg via INTRAVENOUS

## 2018-12-01 MED ORDER — OXYCODONE HCL 5 MG PO TABS: 5.0000 mg | ORAL_TABLET | Freq: Once | ORAL | Status: DC | PRN

## 2018-12-01 MED ORDER — SUGAMMADEX SODIUM 200 MG/2ML IV SOLN
INTRAVENOUS | Status: DC | PRN
  Administered 2018-12-01: 157 mg via INTRAVENOUS

## 2018-12-01 MED ORDER — DEXAMETHASONE SODIUM PHOSPHATE 10 MG/ML IJ SOLN
INTRAMUSCULAR | Status: DC | PRN
  Administered 2018-12-01: 5 mg via INTRAVENOUS

## 2018-12-01 MED ORDER — STERILE WATER FOR INJECTION IJ SOLN
INTRAMUSCULAR | Status: AC
  Filled 2018-12-01: qty 20

## 2018-12-01 MED ORDER — FENTANYL CITRATE (PF) 100 MCG/2ML IJ SOLN: 25.0000 ug | INTRAMUSCULAR | Status: DC | PRN

## 2018-12-01 MED ORDER — SODIUM CHLORIDE 0.9 % IV SOLN: INTRAVENOUS | Status: DC | PRN

## 2018-12-01 MED ORDER — MIDAZOLAM HCL 2 MG/2ML IJ SOLN
INTRAMUSCULAR | Status: DC | PRN
  Administered 2018-12-01: 1 mg via INTRAVENOUS

## 2018-12-01 MED ORDER — APIXABAN 5 MG PO TABS
5.0000 mg | ORAL_TABLET | Freq: Two times a day (BID) | ORAL | Status: DC
  Administered 2018-12-02 – 2018-12-03 (×3): 5 mg via ORAL
  Filled 2018-12-01 (×3): qty 1

## 2018-12-01 MED ORDER — ROCURONIUM BROMIDE 100 MG/10ML IV SOLN
INTRAVENOUS | Status: DC | PRN
  Administered 2018-12-01 (×2): 20 mg via INTRAVENOUS
  Administered 2018-12-01: 30 mg via INTRAVENOUS
  Administered 2018-12-01: 20 mg via INTRAVENOUS

## 2018-12-01 MED ORDER — SODIUM CHLORIDE 0.9 % IV SOLN
INTRAVENOUS | Status: DC | PRN
  Administered 2018-12-01: 50 ug/min via INTRAVENOUS

## 2018-12-01 MED ORDER — PROMETHAZINE HCL 25 MG/ML IJ SOLN: 6.2500 mg | INTRAMUSCULAR | Status: DC | PRN

## 2018-12-01 MED ORDER — SODIUM CHLORIDE 0.9 % IV SOLN
INTRAVENOUS | Status: DC | PRN
  Administered 2018-12-01: 45 mL

## 2018-12-01 MED ORDER — FENTANYL CITRATE (PF) 250 MCG/5ML IJ SOLN
INTRAMUSCULAR | Status: AC
  Filled 2018-12-01: qty 5

## 2018-12-01 MED ORDER — HYDROCODONE-ACETAMINOPHEN 5-325 MG PO TABS
1.0000 | ORAL_TABLET | Freq: Four times a day (QID) | ORAL | Status: DC | PRN
  Administered 2018-12-01 – 2018-12-03 (×5): 2 via ORAL
  Filled 2018-12-01 (×5): qty 2

## 2018-12-01 MED ORDER — HEPARIN (PORCINE) IN NACL 1000-0.9 UT/500ML-% IV SOLN
INTRAVENOUS | Status: AC
  Filled 2018-12-01: qty 500

## 2018-12-01 MED ORDER — LIDOCAINE HCL (CARDIAC) PF 100 MG/5ML IV SOSY
PREFILLED_SYRINGE | INTRAVENOUS | Status: DC | PRN
  Administered 2018-12-01: 100 mg via INTRAVENOUS

## 2018-12-01 MED ORDER — PHENYLEPHRINE HCL 10 MG/ML IJ SOLN
INTRAMUSCULAR | Status: DC | PRN
  Administered 2018-12-01: 100 ug via INTRAVENOUS
  Administered 2018-12-01: 200 ug via INTRAVENOUS
  Administered 2018-12-01 (×4): 100 ug via INTRAVENOUS

## 2018-12-01 MED ORDER — EVICEL 5 ML EX KIT
PACK | CUTANEOUS | Status: DC | PRN
  Administered 2018-12-01: 5 mL

## 2018-12-01 MED ORDER — MIDAZOLAM HCL 2 MG/2ML IJ SOLN
INTRAMUSCULAR | Status: AC
  Filled 2018-12-01: qty 2

## 2018-12-01 MED ORDER — OXYCODONE HCL 5 MG/5ML PO SOLN: 5.0000 mg | Freq: Once | ORAL | Status: DC | PRN

## 2018-12-01 SURGICAL SUPPLY — 82 items
APPLIER CLIP 11 MED OPEN (CLIP)
APPLIER CLIP 9.375 SM OPEN (CLIP)
BAG COUNTER SPONGE EZ (MISCELLANEOUS) ×2 IMPLANT
BAG DECANTER FOR FLEXI CONT (MISCELLANEOUS) ×3 IMPLANT
BAG ISOLATATION DRAPE 20X20 ST (DRAPES) IMPLANT
BALLN DORADO 6X80X80 (BALLOONS) ×3
BALLN LUTONIX 018 6X300X130 (BALLOONS) ×3
BALLN ULTRVRSE 3X300X150 (BALLOONS) ×2
BALLN ULTRVRSE 3X300X150 OTW (BALLOONS) ×1
BALLOON DORADO 6X80X80 (BALLOONS) IMPLANT
BALLOON LUTONIX 018 6X300X130 (BALLOONS) IMPLANT
BALLOON ULTRVRSE 3X300X150 OTW (BALLOONS) IMPLANT
BLADE SURG 15 STRL LF DISP TIS (BLADE) ×1 IMPLANT
BLADE SURG 15 STRL SS (BLADE) ×2
BLADE SURG SZ11 CARB STEEL (BLADE) ×3 IMPLANT
BOOT SUTURE AID YELLOW STND (SUTURE) ×3 IMPLANT
BRUSH SCRUB EZ  4% CHG (MISCELLANEOUS) ×2
BRUSH SCRUB EZ 4% CHG (MISCELLANEOUS) ×1 IMPLANT
CANISTER SUCT 1200ML W/VALVE (MISCELLANEOUS) ×3 IMPLANT
CATH BEACON 5 .035 65 KMP TIP (CATHETERS) ×2 IMPLANT
CATH INDIGO D 50CM (CATHETERS) ×2 IMPLANT
CATH INDIGO SEP D (CATHETERS) ×2 IMPLANT
CHLORAPREP W/TINT 26ML (MISCELLANEOUS) ×3 IMPLANT
CLIP APPLIE 11 MED OPEN (CLIP) IMPLANT
CLIP APPLIE 9.375 SM OPEN (CLIP) IMPLANT
COUNTER SPONGE BAG EZ (MISCELLANEOUS) ×1
COVER WAND RF STERILE (DRAPES) ×1 IMPLANT
DERMABOND ADVANCED (GAUZE/BANDAGES/DRESSINGS) ×2
DERMABOND ADVANCED .7 DNX12 (GAUZE/BANDAGES/DRESSINGS) ×1 IMPLANT
DEVICE PRESTO INFLATION (MISCELLANEOUS) ×2 IMPLANT
DRAPE C-ARM XRAY 36X54 (DRAPES) ×2 IMPLANT
DRAPE INCISE IOBAN 66X45 STRL (DRAPES) ×3 IMPLANT
DRAPE ISOLATE BAG 20X20 STRL (DRAPES)
DRSG OPSITE POSTOP 4X6 (GAUZE/BANDAGES/DRESSINGS) ×4 IMPLANT
ELECT CAUTERY BLADE 6.4 (BLADE) ×3 IMPLANT
ELECT REM PT RETURN 9FT ADLT (ELECTROSURGICAL) ×3
ELECTRODE REM PT RTRN 9FT ADLT (ELECTROSURGICAL) ×1 IMPLANT
GAUZE 4X4 16PLY RFD (DISPOSABLE) ×2 IMPLANT
GLIDEWIRE ADV .035X180CM (WIRE) ×2 IMPLANT
GLOVE BIO SURGEON STRL SZ7 (GLOVE) ×20 IMPLANT
GLOVE INDICATOR 7.5 STRL GRN (GLOVE) ×3 IMPLANT
GOWN STRL REUS W/ TWL LRG LVL3 (GOWN DISPOSABLE) ×1 IMPLANT
GOWN STRL REUS W/ TWL XL LVL3 (GOWN DISPOSABLE) ×2 IMPLANT
GOWN STRL REUS W/TWL LRG LVL3 (GOWN DISPOSABLE) ×2
GOWN STRL REUS W/TWL XL LVL3 (GOWN DISPOSABLE) ×4
HEMOSTAT SURGICEL 2X3 (HEMOSTASIS) ×5 IMPLANT
IV NS 500ML (IV SOLUTION) ×2
IV NS 500ML BAXH (IV SOLUTION) ×1 IMPLANT
KIT TURNOVER KIT A (KITS) ×3 IMPLANT
LABEL OR SOLS (LABEL) ×3 IMPLANT
LOOP RED MAXI  1X406MM (MISCELLANEOUS) ×6
LOOP VESSEL MAXI 1X406 RED (MISCELLANEOUS) ×2 IMPLANT
LOOP VESSEL MINI 0.8X406 BLUE (MISCELLANEOUS) ×2 IMPLANT
LOOPS BLUE MINI 0.8X406MM (MISCELLANEOUS) ×4
NS IRRIG 500ML POUR BTL (IV SOLUTION) ×3 IMPLANT
PACK ANGIOGRAPHY (CUSTOM PROCEDURE TRAY) ×2 IMPLANT
PACK BASIN MAJOR ARMC (MISCELLANEOUS) ×3 IMPLANT
PACK UNIVERSAL (MISCELLANEOUS) ×3 IMPLANT
PATCH CAROTID ECM VASC 1X10 (Prosthesis & Implant Heart) ×2 IMPLANT
SHEATH BRITE TIP 8FRX11 (SHEATH) ×2 IMPLANT
STENT VIABAHN 6X250X120 (Permanent Stent) ×2 IMPLANT
STENT VIABAHN 7X25X120 (Permanent Stent) ×2 IMPLANT
SUT MNCRL 4-0 (SUTURE) ×2
SUT MNCRL 4-0 27XMFL (SUTURE) ×1
SUT PROLENE 5 0 RB 1 DA (SUTURE) ×2 IMPLANT
SUT PROLENE 6 0 BV (SUTURE) ×24 IMPLANT
SUT PROLENE 7 0 BV 1 (SUTURE) ×10 IMPLANT
SUT SILK 2 0 (SUTURE) ×2
SUT SILK 2-0 18XBRD TIE 12 (SUTURE) ×1 IMPLANT
SUT SILK 3 0 (SUTURE) ×2
SUT SILK 3-0 18XBRD TIE 12 (SUTURE) ×1 IMPLANT
SUT SILK 4 0 (SUTURE) ×2
SUT SILK 4-0 18XBRD TIE 12 (SUTURE) ×1 IMPLANT
SUT VIC AB 2-0 CT1 27 (SUTURE) ×4
SUT VIC AB 2-0 CT1 TAPERPNT 27 (SUTURE) ×2 IMPLANT
SUT VIC AB 3-0 SH 27 (SUTURE)
SUT VIC AB 3-0 SH 27X BRD (SUTURE) ×1 IMPLANT
SUT VICRYL+ 3-0 36IN CT-1 (SUTURE) ×6 IMPLANT
SUTURE MNCRL 4-0 27XMF (SUTURE) ×1 IMPLANT
SYR 20CC LL (SYRINGE) ×3 IMPLANT
SYR 5ML LL (SYRINGE) ×3 IMPLANT
TRAY FOLEY MTR SLVR 16FR STAT (SET/KITS/TRAYS/PACK) ×3 IMPLANT

## 2018-12-01 NOTE — Anesthesia Post-op Follow-up Note (Signed)
Anesthesia QCDR form completed.        

## 2018-12-01 NOTE — Anesthesia Postprocedure Evaluation (Signed)
Anesthesia Post Note  Patient: [name]  Procedure(s) Performed: ENDARTERECTOMY FEMORAL (Left Groin) LOWER EXTREMITY ANGIOGRAM (Left Groin)  Patient location during evaluation: PACU Anesthesia Type: General Level of consciousness: awake and alert and oriented Pain management: pain level controlled Vital Signs Assessment: post-procedure vital signs reviewed and stable Respiratory status: spontaneous breathing, nonlabored ventilation and respiratory function stable Cardiovascular status: blood pressure returned to baseline and stable Postop Assessment: no signs of nausea or vomiting Anesthetic complications: no     Last Vitals:  Vitals:   12/01/18 1444 12/01/18 1454  BP: (!) 92/59 (P) 114/63  Pulse: 66   Resp: 10   Temp:    SpO2: 92% (P) 94%    Last Pain:  Vitals:   12/01/18 1434  TempSrc:   PainSc: 0-No pain                 Yoshio Seliga

## 2018-12-01 NOTE — Op Note (Addendum)
OPERATIVE NOTE   PROCEDURE: 1. Left common femoral, profunda femoris, and superficial femoral artery endarterectomies and patch angioplasty    PRE-OPERATIVE DIAGNOSIS: 1.Atherosclerotic occlusive disease left lower extremities with ulceration and rest pain left lower extremity 2.  Diabetes mellitus  POST-OPERATIVE DIAGNOSIS: Same  SURGEON: Leotis Pain, MD  Assistant: Hezzie Bump, PA-C  ANESTHESIA: general  ESTIMATED BLOOD LOSS: 500 cc  FINDING(S): 1. significant plaque in left common femoral, profunda femoris arteries more severe in the profunda.  Occlusion of the SFA and popliteal down to the tibial vessles  SPECIMEN(S): Left common femoral, profunda femoris plaque.  Hyperplasia and thrombus with the thrombectomy device from the SFA and popliteal arteries  INDICATIONS:  Patient presents with rest pain and ulceration of the left foot.  He underwent an intervention about a month ago with an early reocclusion and an angiogram yesterday showed he needed a femoral endarterectomy in addition to trying to reopen his previous infrainguinal interventions.  Left femoral endarterectomy is planned to try to improve perfusion. Left leg angiogram and intervention as necessary is also planned.  The risks and benefits as well as alternative therapies including intervention were reviewed in detail all questions were answered the patient agrees to proceed with surgery. An assistant was present during the procedure to help facilitate the exposure and expedite the procedure.  DESCRIPTION: After obtaining full informed written consent, the patient was brought back to the operating room and placed supine upon the operating table. The patient received IV antibiotics prior to induction. After obtaining adequate anesthesia, the patient was prepped and draped in the standard fashion appropriate time out is called. The assistant provided retraction and mobilization to help facilitate exposure and  expedite the procedure throughout the entire procedure.  This included following suture, using retractors, and optimizing lighting.  Vertical incision was created overlying the left femoral arteries. The common femoral artery proximally, and superficial femoral artery, and primary profunda femoris artery branches were encircled with vessel loops and prepared for control. The left femoral arteries were found to have significant plaque from the common femoral artery into the profunda and superficial femoral arteries.   5000 units of heparin was given and allowed circulate for 5 minutes.  An additional 2000 units of heparin were given the procedure  Attention is then turned to the left femoral artery. An arteriotomy is made with 11 blade and extended with Potts scissors in the common femoral artery and carried down onto the first 2-3 cm of the fundal femoris artery. An endarterectomy was then performed. The [facility] was used to create a plane. The proximal endpoint was cut flush with tenotomy scissors. This was in the proximal common femoral artery.  The bulk of the plaque was actually in the profunda femoris artery and the true femoral bifurcation which was occluding the proximal portion of the left SFA stents as well.  This was peeled back to gain access to the lumen of the left SFA and an 8 French sheath was placed into the SFA.  This was more likely to be thrombotic in nature and 8 mg of TPA was instilled into the left SFA through the sheath.  After this dwelled, the penumbra cat the thrombectomy device was brought onto the field and passes with the penumbra cat D device were performed.  Imaging was performed which still showed no forward flow through the left SFA or popliteal arteries.  I easily navigated a Kumpe catheter with the help of an advantage wire down into the tibial vessels.  Selective imaging of both the peroneal arteries and the anterior tibial arteries were performed and these were  continuous distally although there was occlusion to their proximal segments.  After another pass with the penumbra cat D device was not successful in restoring flow, I proceeded with angioplasty.  A 3 mm diameter by 30 cm length angioplasty balloon was inflated in the proximal to mid anterior tibial artery up into the popliteal artery and inflated to 12 atm for 1 minute.  I then used a 6 mm diameter by 30 cm length Lutonix drug-coated angioplasty balloon to treat the popliteal artery just below the previously placed stents up to the mid to distal SFA.  An additional inflation was used in the proximal SFA.  Following this, there remained multiple areas of greater than 50% residual stenosis and what appeared to be more chronic appearing thrombus within the stent and in the below-knee popliteal artery just distal to the stent.  I then placed a 6 mm diameter by 25 cm length via bond stent from the distal popliteal artery just above the origin of the anterior tibial artery up to the distal SFA.  A 7 mm diameter by 25 cm length via bond stent was then deployed with slight overlap to the first stent and taken up to about 1 cm from the origin of the SFA to where we could actually visualize this.  These were postdilated with 6 mm diameter by 30 cm length Lutonix drug-coated angioplasty balloon inflated twice first proximally and then distally.  Completion imaging following these stent showed no greater than 10% residual stenosis throughout the SFA and popliteal arteries.  There was some vasospasm distally but after selective injection was performed with a catheter downstream the anterior tibial artery was widely patent after angioplasty with no significant residual stenosis in the peroneal artery remained continuous distally as well.  At this point, the sheath was removed from the SFA and we proceeded with closure of our endarterectomy in the femoral arteries.  Good backbleeding was then seen from the SFA as well. The distal  endpoint of the profunda femoris artery endarterectomy was created with gentle traction and the distal endpoint was quite clean. I then tacked down the endarterectomy plane with a series of 7-0 Prolene sutures.  I also tacked down the proximal SFA stent to avoid any flow around the stent with a series of 6-0 Prolene sutures.  The Cormatrix patcth is then selected and prepared for a patch angioplasty.  It is cut and beveled and started at the proximal endpoint with a 6-0 Prolene suture.  Approximately one half of the suture line is run medially and laterally and the distal end point was cut and bevelled to match the arteriotomy in the profunda femoris artery.  A second 6-0 Prolene was started at the distal end point in the profunda femoris artery and run to the mid portion to complete the arteriotomy.  The vessel was flushed prior to release of control and completion of the anastomosis.  At this point, flow was established first to the profunda femoris artery and then to the superficial femoral artery. Easily palpable pulses are noted well beyond the anastomosis and both arteries.  Surgicel and Evicel topical hemostatic agents were placed in the femoral incision and hemostasis was complete. The femoral incision was then closed in a layered fashion with 2 layers of 2-0 Vicryl, 2 layers of 3-0 Vicryl, and 4-0 Monocryl for the skin closure. Dermabond and sterile dressing were then placed over  the incision.  The patient was then awakened from anesthesia and taken to the recovery room in stable condition having tolerated the procedure well.  COMPLICATIONS: None  CONDITION: Stable     Leotis Pain 12/01/2018 1:39 PM   This note was created with Dragon Medical transcription system. Any errors in dictation are purely unintentional.

## 2018-12-01 NOTE — Anesthesia Procedure Notes (Signed)
Procedure Name: Intubation Date/Time: 12/01/2018 10:11 AM Performed by: Justus Memory, CRNA Pre-anesthesia Checklist: Patient identified, Patient being monitored, Timeout performed, Emergency Drugs available and Suction available Patient Re-evaluated:Patient Re-evaluated prior to induction Oxygen Delivery Method: Circle system utilized Preoxygenation: Pre-oxygenation with 100% oxygen Induction Type: IV induction Ventilation: Mask ventilation without difficulty Laryngoscope Size: Mac and 3 Grade View: Grade I Tube type: Oral Tube size: 7.0 mm Number of attempts: 1 Airway Equipment and Method: Stylet Placement Confirmation: ETT inserted through vocal cords under direct vision,  positive ETCO2 and breath sounds checked- equal and bilateral Secured at: 21 cm Tube secured with: Tape Dental Injury: Teeth and Oropharynx as per pre-operative assessment

## 2018-12-01 NOTE — H&P (Signed)
[facility] History & Physical Update  The patient was interviewed and re-examined.  The patient's previous History and Physical has been reviewed and is unchanged.  There is no change in the plan of care. We plan to proceed with the scheduled procedure.  Leotis Pain, MD  12/01/2018, 8:14 AM

## 2018-12-01 NOTE — Transfer of Care (Addendum)
Immediate Anesthesia Transfer of Care Note  Patient: [name]  Procedure(s) Performed: ENDARTERECTOMY FEMORAL (Left Groin) LOWER EXTREMITY ANGIOGRAM (Left Groin)  Patient Location: PACU  Anesthesia Type:General  Level of Consciousness: awake and alert   Airway & Oxygen Therapy: Patient Spontanous Breathing and Patient connected to face mask oxygen  Post-op Assessment: Report given to RN and Post -op Vital signs reviewed and stable  Post vital signs: Reviewed and stable  Last Vitals:  Vitals Value Taken Time  BP 98/60 12/01/2018  1:59 PM  Temp    Pulse 32 12/01/2018  1:58 PM  Resp 13 12/01/2018  2:00 PM  SpO2 93 % 12/01/2018  1:58 PM  Vitals shown include unvalidated device data.  Last Pain:  Vitals:   12/01/18 0905  TempSrc: Oral  PainSc: 3       Patients Stated Pain Goal: 0 (37/04/88 8916)  Complications: No apparent anesthesia complications

## 2018-12-01 NOTE — Progress Notes (Signed)
Patient ID: [name], male   DOB: March 04, 1951, 68 y.o.   MRN: [id number]  [facility] PROGRESS NOTE  [name] VXB:[id number] DOB: 03-29-1951 DOA: 11/30/2018 PCP: [name], MD  HPI/Subjective: Patient feeling sore where the procedure was done.  Otherwise feels okay.  Prior to coming into the hospital he was having leg pain at rest when he was standing or sitting.  Objective: Vitals:   12/01/18 1503 12/01/18 1522  BP: 117/69 118/71  Pulse: 65 66  Resp: 12 18  Temp:  97.8 F (36.6 C)  SpO2: 97% 98%    Filed Weights   11/30/18 0821 12/01/18 0905  Weight: 78.5 kg 78.5 kg    ROS: Review of Systems  Constitutional: Negative for chills and fever.  Eyes: Negative for blurred vision.  Respiratory: Negative for cough and shortness of breath.   Cardiovascular: Negative for chest pain.  Gastrointestinal: Negative for abdominal pain, constipation, diarrhea, nausea and vomiting.  Genitourinary: Negative for dysuria.  Musculoskeletal: Positive for joint pain.  Neurological: Negative for dizziness and headaches.   Exam: Physical Exam  Constitutional: He is oriented to person, place, and time.  HENT:  Nose: No mucosal edema.  Mouth/Throat: No oropharyngeal exudate or posterior oropharyngeal edema.  Eyes: Pupils are equal, round, and reactive to light. Conjunctivae, EOM and lids are normal.  Neck: No JVD present. Carotid bruit is not present. No edema present. No thyroid mass and no thyromegaly present.  Cardiovascular: S1 normal and S2 normal. Exam reveals no gallop.  No murmur heard. Pulses:      Dorsalis pedis pulses are 2+ on the right side and 2+ on the left side.  Respiratory: No respiratory distress. He has no wheezes. He has no rhonchi. He has no rales.  GI: Soft. Bowel sounds are normal. There is no abdominal tenderness.  Musculoskeletal:     Right ankle: He exhibits no swelling.     Left ankle: He exhibits no swelling.  Lymphadenopathy:    He has no  cervical adenopathy.  Neurological: He is alert and oriented to person, place, and time. No cranial nerve deficit.  Skin: Skin is warm. Nails show no clubbing.  Chronic lower extremity skin discoloration.  Left foot 2 areas laterally with blackish scabs.  Psychiatric: He has a normal mood and affect.      Data Reviewed: Basic Metabolic Panel: Recent Labs  Lab 11/28/18 0800 12/01/18 0512  NA  --  135  K  --  4.3  CL  --  103  CO2  --  26  GLUCOSE  --  158*  BUN 26* 23  CREATININE 1.08 1.00  CALCIUM  --  8.4*  MG  --  1.9   CBC: Recent Labs  Lab 12/01/18 0512 12/01/18 1451  WBC 4.9 4.7  HGB 12.1* 10.9*  HCT 35.9* 33.5*  MCV 85.7 86.6  PLT 145* 127*    CBG: Recent Labs  Lab 11/30/18 2111 11/30/18 2352 12/01/18 0747 12/01/18 0902 12/01/18 1406  GLUCAP 213* 259* 113* 117* 192*    Recent Results (from the past 240 hour(s))  MRSA PCR Screening     Status: None   Collection Time: 11/30/18  4:35 PM  Result Value Ref Range Status   MRSA by PCR NEGATIVE NEGATIVE Final    Comment:        The GeneXpert MRSA Assay (FDA approved for NASAL specimens only), is one component of a comprehensive MRSA colonization surveillance program. It is not intended to diagnose  MRSA infection nor to guide or monitor treatment for MRSA infections. Performed at [facility], [address], [place], [name] [address]      Studies: Dg C-arm 1-60 Min-no Report  Result Date: 12/01/2018 Fluoroscopy was utilized by the requesting physician.  No radiographic interpretation.    Scheduled Meds: . [START ON 12/02/2018] apixaban  5 mg Oral BID  . aspirin EC  81 mg Oral Daily  . calcium-vitamin D   Oral Daily  . docusate sodium  100 mg Oral BID  . gabapentin  200 mg Oral TID  . glyBURIDE  2.5 mg Oral BID WC  . Heparin (Porcine) in NaCl      . insulin aspart  0-15 Units Subcutaneous TID WC  . insulin aspart  0-5 Units Subcutaneous QHS  . lisinopril  5 mg Oral Daily  .  metoprolol succinate  100 mg Oral Daily  . mometasone-formoterol  2 puff Inhalation BID  . omega-3 acid ethyl esters  1 g Oral Daily  . oxybutynin  5 mg Oral BID  . pravastatin  10 mg Oral q1800  . sterile water (preservative free)      . vitamin B-12  500 mcg Oral Daily   Continuous Infusions: . sodium chloride 75 mL/hr at 12/01/18 1524    Assessment/Plan:  1. Type 2 diabetes mellitus.  On glyburide and sliding scale 2. Peripheral vascular disease status post surgery today.  Patient currently on aspirin.  Was on Plavix as outpatient. 3. Chronic left lower extremity wound secondary to peripheral vascular disease.  No signs of infection. 4. Essential hypertension on metoprolol, lisinopril 5. Hyperlipidemia unspecified on pravastatin 6. History of COPD.  Respiratory status stable  Code Status:     Code Status Orders  (From admission, onward)         Start     Ordered   11/30/18 1304  Full code  Continuous     11/30/18 1303        Code Status History    Date Active Date Inactive Code Status Order ID Comments User Context   10/30/2018 6834 10/30/2018 1944 Full Code [id number]  [name], MD Inpatient   10/20/2018 1332 10/20/2018 1732 Full Code [id number]  Samara Deist, [place] Inpatient   05/12/2017 1639 05/13/2017 1840 Full Code [id number]  [name], MD Inpatient   02/25/2017 1450 02/25/2017 1848 Full Code [id number]  Samara Deist, DPM Inpatient   07/10/2015 1430 07/12/2015 1354 Full Code [id number]  Aldean Jewett, MD Inpatient     Family Communication: Family at bedside Disposition Plan: As per Dr. Lucky Cowboy vascular surgery  Time spent: 36 minutes  [name] [employer]

## 2018-12-02 LAB — BASIC METABOLIC PANEL
Anion gap: 7 (ref 5–15)
BUN: 23 mg/dL (ref 8–23)
CO2: 25 mmol/L (ref 22–32)
Calcium: 8 mg/dL — ABNORMAL LOW (ref 8.9–10.3)
Chloride: 104 mmol/L (ref 98–111)
Creatinine, Ser: 1.02 mg/dL (ref 0.61–1.24)
GFR calc Af Amer: >60 mL/min (ref >60)
GFR calc non Af Amer: >60 mL/min (ref >60)
Glucose, Bld: 168 mg/dL — ABNORMAL HIGH (ref 70–99)
Potassium: 4.7 mmol/L (ref 3.5–5.1)
Sodium: 136 mmol/L (ref 135–145)

## 2018-12-02 LAB — GLUCOSE, CAPILLARY
Glucose-Capillary: 145 mg/dL — ABNORMAL HIGH (ref 70–99)
Glucose-Capillary: 204 mg/dL — ABNORMAL HIGH (ref 70–99)
Glucose-Capillary: 184 mg/dL — ABNORMAL HIGH (ref 70–99)
Glucose-Capillary: 202 mg/dL — ABNORMAL HIGH (ref 70–99)

## 2018-12-02 LAB — CBC
HCT: 30.4 % — ABNORMAL LOW (ref 39.0–52.0)
Hemoglobin: 10.2 g/dL — ABNORMAL LOW (ref 13.0–17.0)
MCH: 28.9 pg (ref 26.0–34.0)
MCHC: 33.6 g/dL (ref 30.0–36.0)
MCV: 86.1 fL (ref 80.0–100.0)
PLATELETS: 149 10*3/uL — AB (ref 150–400)
RBC: 3.53 MIL/uL — ABNORMAL LOW (ref 4.22–5.81)
RDW: 13 % (ref 11.5–15.5)
WBC: 6 10*3/uL (ref 4.0–10.5)
nRBC: 0 % (ref 0.0–0.2)

## 2018-12-02 MED ORDER — TRAZODONE HCL 50 MG PO TABS
50.0000 mg | ORAL_TABLET | Freq: Every evening | ORAL | Status: DC | PRN
  Administered 2018-12-02 – 2018-12-03 (×2): 50 mg via ORAL
  Filled 2018-12-02 (×2): qty 1

## 2018-12-02 MED ORDER — GABAPENTIN 300 MG PO CAPS
300.0000 mg | ORAL_CAPSULE | Freq: Three times a day (TID) | ORAL | Status: DC
  Administered 2018-12-02 – 2018-12-03 (×4): 300 mg via ORAL
  Filled 2018-12-02 (×4): qty 1

## 2018-12-02 MED ORDER — MUPIROCIN CALCIUM 2 % EX CREA
TOPICAL_CREAM | Freq: Two times a day (BID) | CUTANEOUS | Status: DC
  Administered 2018-12-02 – 2018-12-03 (×3): via TOPICAL
  Filled 2018-12-02: qty 15

## 2018-12-02 NOTE — Progress Notes (Signed)
Patient ID: [name], male   DOB: 1951/03/19, 68 y.o.   MRN: [id number]  [facility] PROGRESS NOTE  KAYLEB WARSHAW NFA:[id number] DOB: 1950/10/11 DOA: 11/30/2018 PCP: [name], MD  HPI/Subjective: Patient stated he did not sleep in a couple nights.  Having a lot of pain in his toes.  States he takes gabapentin.  Objective: Vitals:   12/02/18 0918 12/02/18 1149  BP: 122/82 109/65  Pulse: 78 63  Resp:  16  Temp:  97.7 F (36.5 C)  SpO2:  99%    Filed Weights   11/30/18 0821 12/01/18 0905 12/02/18 0545  Weight: 78.5 kg 78.5 kg 80.2 kg    ROS: Review of Systems  Constitutional: Negative for chills and fever.  Eyes: Negative for blurred vision.  Respiratory: Negative for cough and shortness of breath.   Cardiovascular: Negative for chest pain.  Gastrointestinal: Negative for abdominal pain, constipation, diarrhea, nausea and vomiting.  Genitourinary: Negative for dysuria.  Musculoskeletal: Positive for joint pain.  Neurological: Negative for dizziness and headaches.   Exam: Physical Exam  Constitutional: He is oriented to person, place, and time.  HENT:  Nose: No mucosal edema.  Mouth/Throat: No oropharyngeal exudate or posterior oropharyngeal edema.  Eyes: Pupils are equal, round, and reactive to light. Conjunctivae, EOM and lids are normal.  Neck: No JVD present. Carotid bruit is not present. No edema present. No thyroid mass and no thyromegaly present.  Cardiovascular: S1 normal and S2 normal. Exam reveals no gallop.  No murmur heard. Pulses:      Dorsalis pedis pulses are 2+ on the right side and 2+ on the left side.  Respiratory: No respiratory distress. He has no wheezes. He has no rhonchi. He has no rales.  GI: Soft. Bowel sounds are normal. There is no abdominal tenderness.  Musculoskeletal:     Right ankle: He exhibits no swelling.     Left ankle: He exhibits no swelling.  Lymphadenopathy:    He has no cervical adenopathy.  Neurological: He is  alert and oriented to person, place, and time. No cranial nerve deficit.  Skin: Skin is warm. Nails show no clubbing.  Chronic lower extremity skin discoloration.  Left foot 2 areas laterally with blackish scabs.  Psychiatric: He has a normal mood and affect.      Data Reviewed: Basic Metabolic Panel: Recent Labs  Lab 11/28/18 0800 12/01/18 0512 12/02/18 0603  NA  --  135 136  K  --  4.3 4.7  CL  --  103 104  CO2  --  26 25  GLUCOSE  --  158* 168*  BUN 26* 23 23  CREATININE 1.08 1.00 1.02  CALCIUM  --  8.4* 8.0*  MG  --  1.9  --    CBC: Recent Labs  Lab 12/01/18 0512 12/01/18 1451 12/02/18 0603  WBC 4.9 4.7 6.0  HGB 12.1* 10.9* 10.2*  HCT 35.9* 33.5* 30.4*  MCV 85.7 86.6 86.1  PLT 145* 127* 149*    CBG: Recent Labs  Lab 12/01/18 1406 12/01/18 1658 12/01/18 2111 12/02/18 0728 12/02/18 1147  GLUCAP 192* 238* 227* 145* 204*    Recent Results (from the past 240 hour(s))  MRSA PCR Screening     Status: None   Collection Time: 11/30/18  4:35 PM  Result Value Ref Range Status   MRSA by PCR NEGATIVE NEGATIVE Final    Comment:        The GeneXpert MRSA Assay (FDA approved for NASAL specimens only),  is one component of a comprehensive MRSA colonization surveillance program. It is not intended to diagnose MRSA infection nor to guide or monitor treatment for MRSA infections. Performed at [facility], [address], [place], [place] [address]      Studies: Dg C-arm 1-60 Min-no Report  Result Date: 12/01/2018 Fluoroscopy was utilized by the requesting physician.  No radiographic interpretation.    Scheduled Meds: . apixaban  5 mg Oral BID  . aspirin EC  81 mg Oral Daily  . calcium-vitamin D   Oral Daily  . docusate sodium  100 mg Oral BID  . gabapentin  300 mg Oral TID  . glyBURIDE  2.5 mg Oral BID WC  . insulin aspart  0-15 Units Subcutaneous TID WC  . insulin aspart  0-5 Units Subcutaneous QHS  . lisinopril  5 mg Oral Daily  .  metoprolol succinate  100 mg Oral Daily  . mometasone-formoterol  2 puff Inhalation BID  . mupirocin cream   Topical BID  . omega-3 acid ethyl esters  1 g Oral Daily  . oxybutynin  5 mg Oral BID  . pravastatin  10 mg Oral q1800  . vitamin B-12  500 mcg Oral Daily   Continuous Infusions: . sodium chloride 75 mL/hr at 12/02/18 0400    Assessment/Plan:  1. Type 2 diabetes mellitus.  On glyburide and sliding scale 2. Peripheral vascular disease status post surgery yesterday. Patient currently on aspirin and started on Eliquis. 3. Chronic left lower extremity wound secondary to peripheral vascular disease. 4. Essential hypertension on metoprolol, lisinopril 5. Hyperlipidemia unspecified on pravastatin 6. History of COPD.  Respiratory status stable 7. Neuropathy.  Increase gabapentin to 300 mg 3 times daily  Code Status:     Code Status Orders  (From admission, onward)         Start     Ordered   11/30/18 1304  Full code  Continuous     11/30/18 1303        Code Status History    Date Active Date Inactive Code Status Order ID Comments User Context   10/30/2018 4401 10/30/2018 1944 Full Code [id number]  [name], MD Inpatient   10/20/2018 1332 10/20/2018 1732 Full Code [id number]  Samara Deist, DPM Inpatient   05/12/2017 1639 05/13/2017 1840 Full Code [id number]  [name], MD Inpatient   02/25/2017 1450 02/25/2017 1848 Full Code [id number]  Samara Deist, DPM Inpatient   07/10/2015 1430 07/12/2015 1354 Full Code [id number]  Aldean Jewett, MD Inpatient     Family Communication: Family yesterday Disposition Plan: As per Dr. Lucky Cowboy vascular surgery  Time spent: 68 minutes  Ryen Heitmeyer [employer]

## 2018-12-02 NOTE — Progress Notes (Addendum)
1 Day Post-Op   Subjective/Chief Complaint: The patient slept comfortably overnight and denies any discomfort.  He states that he is having sensation in both feet, which is a change from before his surgery.  Objective: Vital signs in last 24 hours: Temp:  [97.3 F (36.3 C)-98.5 F (36.9 C)] 98.2 F (36.8 C) (03/07 0545) Pulse Rate:  [32-78] 78 (03/07 0918) Resp:  [10-20] 20 (03/07 0545) BP: (87-122)/(58-82) 122/82 (03/07 0918) SpO2:  [92 %-100 %] 100 % (03/07 0545) Weight:  [80.2 kg] 80.2 kg (03/07 0545) Last BM Date: 11/29/18  Intake/Output from previous day: 03/06 0701 - 03/07 0700 In: 2459.7 [P.O.:120; I.V.:2339.7] Out: 2925 [Urine:2425; Blood:500] Intake/Output this shift: Total I/O In: 240 [P.O.:240] Out: -   General appearance: alert, cooperative, appears older than stated age and no distress Head: Normocephalic, without obvious abnormality, atraumatic Eyes: conjunctivae/corneas clear. PERRL, EOM's intact. Fundi benign. Ears: normal TM's and external ear canals both ears Neck: no adenopathy, no carotid bruit, no JVD, supple, symmetrical, trachea midline and thyroid not enlarged, symmetric, no tenderness/mass/nodules Resp: clear to auscultation bilaterally Chest wall: no tenderness Extremities: Bilateral lower extremities are cool to touch.  The right foot is unremarkable without any wounds.  The left foot he has a lateral dry eschar over the base of the fifth digit.  There is some maceration and smell of Pseudomonas between the fifth and fourth digit with a superficial ulceration.  Toes are missing from previous amputations.  Great toes got adequate capillary refill. Pulses: Doppler signals are found on the right side and are mono to biphasic.  The left side has mono to biphasic Doppler signals as well. Incision/Wound:  Lab Results:  Recent Labs    12/01/18 1451 12/02/18 0603  WBC 4.7 6.0  HGB 10.9* 10.2*  HCT 33.5* 30.4*  PLT 127* 149*   BMET Recent Labs   12/01/18 0512 12/02/18 0603  NA 135 136  K 4.3 4.7  CL 103 104  CO2 26 25  GLUCOSE 158* 168*  BUN 23 23  CREATININE 1.00 1.02  CALCIUM 8.4* 8.0*   PT/INR Recent Labs    12/01/18 0512  LABPROT 13.4  INR 1.0   ABG No results for input(s): PHART, HCO3 in the last 72 hours.  Invalid input(s): PCO2, PO2  Studies/Results: Dg C-arm 1-60 Min-no Report  Result Date: 12/01/2018 Fluoroscopy was utilized by the requesting physician.  No radiographic interpretation.    Anti-infectives: Anti-infectives (From admission, onward)   Start     Dose/Rate Route Frequency Ordered Stop   12/01/18 0700  clindamycin (CLEOCIN) IVPB 900 mg    Note to Pharmacy:  Please send with patient to OR   900 mg 100 mL/hr over 30 Minutes Intravenous  Once 11/30/18 1556 12/01/18 1025   11/30/18 0907  clindamycin (CLEOCIN) 300 MG/50ML IVPB    Note to Pharmacy:  Fransico Michael   : cabinet override      11/30/18 0907 11/30/18 2114   11/29/18 2345  clindamycin (CLEOCIN) IVPB 300 mg     300 mg 100 mL/hr over 30 Minutes Intravenous  Once 11/29/18 2331 11/30/18 1043      Assessment/Plan: s/p Procedure(s): ENDARTERECTOMY FEMORAL (Left) LOWER EXTREMITY ANGIOGRAM (Left) d/c foley Advance diet Plan for discharge tomorrow The patient will need wound care to the left foot with application of triple antibiotic ointment between the fifth and fourth digits after cultures were taken.  Will apply Silvadene cream to the lateral fifth toe eschar.   LOS: 2 days  Elmore Guise 12/02/2018  Patient is allergic to sulfa drugs.  I will change the Silvadene cream to mupirocin cream.

## 2018-12-03 ENCOUNTER — Encounter: Payer: Self-pay | Admitting: Vascular Surgery

## 2018-12-03 LAB — GLUCOSE, CAPILLARY: Glucose-Capillary: 171 mg/dL — ABNORMAL HIGH (ref 70–99)

## 2018-12-03 MED ORDER — TRAMADOL HCL 50 MG PO TABS: 50.0000 mg | ORAL_TABLET | Freq: Four times a day (QID) | ORAL | 0 refills | Status: DC | PRN

## 2018-12-03 MED ORDER — GABAPENTIN 300 MG PO CAPS: 300.0000 mg | ORAL_CAPSULE | Freq: Three times a day (TID) | ORAL | 3 refills | Status: DC

## 2018-12-03 MED ORDER — APIXABAN 5 MG PO TABS: 5.0000 mg | ORAL_TABLET | Freq: Two times a day (BID) | ORAL | 0 refills | Status: DC

## 2018-12-03 MED ORDER — MUPIROCIN CALCIUM 2 % EX CREA: TOPICAL_CREAM | Freq: Two times a day (BID) | CUTANEOUS | 0 refills | Status: DC

## 2018-12-03 MED ORDER — POLYETHYLENE GLYCOL 3350 17 G PO PACK: 17.0000 g | PACK | Freq: Every day | ORAL | Status: DC

## 2018-12-03 NOTE — Progress Notes (Signed)
Patient ID: [name], male   DOB: 1951/08/09, 68 y.o.   MRN: [id number]  [facility] PROGRESS NOTE  [name] MEQ:[id number] DOB: November 05, 1950 DOA: 11/30/2018 PCP: [name], MD  HPI/Subjective: States he did not sleep again last night.  Has toe pain at 3 in the morning and his gabapentin wears off.  States he has not had a bowel movement yet.  Objective: Vitals:   12/03/18 0427 12/03/18 0842  BP: 109/62 (!) 104/56  Pulse: 76 74  Resp: 20   Temp: 98 F (36.7 C)   SpO2: 95%     Filed Weights   12/01/18 0905 12/02/18 0545 12/03/18 0427  Weight: 78.5 kg 80.2 kg 81.8 kg    ROS: Review of Systems  Constitutional: Negative for chills and fever.  Eyes: Negative for blurred vision.  Respiratory: Negative for cough and shortness of breath.   Cardiovascular: Negative for chest pain.  Gastrointestinal: Negative for abdominal pain, constipation, diarrhea, nausea and vomiting.  Genitourinary: Negative for dysuria.  Musculoskeletal: Positive for joint pain.  Neurological: Negative for dizziness and headaches.   Exam: Physical Exam  Constitutional: He is oriented to person, place, and time.  HENT:  Nose: No mucosal edema.  Mouth/Throat: No oropharyngeal exudate or posterior oropharyngeal edema.  Eyes: Pupils are equal, round, and reactive to light. Conjunctivae, EOM and lids are normal.  Neck: No JVD present. Carotid bruit is not present. No edema present. No thyroid mass and no thyromegaly present.  Cardiovascular: S1 normal and S2 normal. Exam reveals no gallop.  No murmur heard. Pulses:      Dorsalis pedis pulses are 2+ on the right side and 2+ on the left side.  Respiratory: No respiratory distress. He has no wheezes. He has no rhonchi. He has no rales.  GI: Soft. Bowel sounds are normal. There is no abdominal tenderness.  Musculoskeletal:     Right ankle: He exhibits no swelling.     Left ankle: He exhibits no swelling.  Lymphadenopathy:    He has no cervical  adenopathy.  Neurological: He is alert and oriented to person, place, and time. No cranial nerve deficit.  Skin: Skin is warm. Nails show no clubbing.  Chronic lower extremity skin discoloration.  Left foot 2 areas laterally with blackish scabs.  Psychiatric: He has a normal mood and affect.      Data Reviewed: Basic Metabolic Panel: Recent Labs  Lab 11/28/18 0800 12/01/18 0512 12/02/18 0603  NA  --  135 136  K  --  4.3 4.7  CL  --  103 104  CO2  --  26 25  GLUCOSE  --  158* 168*  BUN 26* 23 23  CREATININE 1.08 1.00 1.02  CALCIUM  --  8.4* 8.0*  MG  --  1.9  --    CBC: Recent Labs  Lab 12/01/18 0512 12/01/18 1451 12/02/18 0603  WBC 4.9 4.7 6.0  HGB 12.1* 10.9* 10.2*  HCT 35.9* 33.5* 30.4*  MCV 85.7 86.6 86.1  PLT 145* 127* 149*    CBG: Recent Labs  Lab 12/02/18 0728 12/02/18 1147 12/02/18 1626 12/02/18 2119 12/03/18 0741  GLUCAP 145* 204* 184* 202* 171*    Recent Results (from the past 240 hour(s))  MRSA PCR Screening     Status: None   Collection Time: 11/30/18  4:35 PM  Result Value Ref Range Status   MRSA by PCR NEGATIVE NEGATIVE Final    Comment:        The  GeneXpert MRSA Assay (FDA approved for NASAL specimens only), is one component of a comprehensive MRSA colonization surveillance program. It is not intended to diagnose MRSA infection nor to guide or monitor treatment for MRSA infections. Performed at [facility], [address], [place], [place] [address]      Studies: Dg C-arm 1-60 Min-no Report  Result Date: 12/01/2018 Fluoroscopy was utilized by the requesting physician.  No radiographic interpretation.    Scheduled Meds: . apixaban  5 mg Oral BID  . aspirin EC  81 mg Oral Daily  . calcium-vitamin D   Oral Daily  . docusate sodium  100 mg Oral BID  . gabapentin  300 mg Oral TID  . glyBURIDE  2.5 mg Oral BID WC  . insulin aspart  0-15 Units Subcutaneous TID WC  . insulin aspart  0-5 Units Subcutaneous QHS  .  lisinopril  5 mg Oral Daily  . metoprolol succinate  100 mg Oral Daily  . mometasone-formoterol  2 puff Inhalation BID  . mupirocin cream   Topical BID  . omega-3 acid ethyl esters  1 g Oral Daily  . oxybutynin  5 mg Oral BID  . polyethylene glycol  17 g Oral Daily  . pravastatin  10 mg Oral q1800  . vitamin B-12  500 mcg Oral Daily   Continuous Infusions:   Assessment/Plan:  1. Type 2 diabetes mellitus.  On glyburide and sliding scale.  Hemoglobin A1c 7.5 back in January.  2. Peripheral vascular disease status post surgery yesterday. Patient currently on aspirin and started on Eliquis.  Benefits and risks of starting Eliquis explained to patient.  Care manager consult for Eliquis 30-day free card. 3. Chronic left lower extremity wound secondary to peripheral vascular disease. 4. Essential hypertension on metoprolol, lisinopril 5. Hyperlipidemia unspecified on pravastatin 6. History of COPD.  Respiratory status stable 7. Neuropathy.  Increase gabapentin to 300 mg 3 times daily 8. Constipation start MiraLAX  We will sign off since vascular surgery likely discharging today  Code Status:     Code Status Orders  (From admission, onward)         Start     Ordered   11/30/18 1304  Full code  Continuous     11/30/18 1303        Code Status History    Date Active Date Inactive Code Status Order ID Comments User Context   10/30/2018 1031 10/30/2018 1944 Full Code [id number]  [name], MD Inpatient   10/20/2018 1332 10/20/2018 1732 Full Code [id number]  Samara Deist, [place] Inpatient   05/12/2017 1639 05/13/2017 1840 Full Code [id number]  [name], MD Inpatient   02/25/2017 1450 02/25/2017 1848 Full Code [id number]  Samara Deist, DPM Inpatient   07/10/2015 1430 07/12/2015 1354 Full Code [id number]  Aldean Jewett, MD Inpatient      Disposition Plan: As per vascular surgery likely discharging today.  I will sign off.  Time spent: 25 minutes  [place]

## 2018-12-03 NOTE — Discharge Summary (Signed)
Physician Discharge Summary  Patient ID: [name] MRN: [id number] DOB/AGE: May 09, 1951 68 y.o.  Admit date: 11/30/2018 Discharge date: 12/03/2018  Admission Diagnoses:  Discharge Diagnoses:  Active Problems:   Atherosclerosis of artery of extremity with ulceration [facility])   Discharged Condition: good  Hospital Course: Uneventful postoperative course.  Consults: Medicine - Dr. Earleen Newport  Significant Diagnostic Studies: Treatments: Femoral endarterectomy Discharge Exam: Blood pressure (!) 104/56, pulse 74, temperature 98 F (36.7 C), temperature source Oral, resp. rate 20, height 5\' 10"  (1.778 m), weight 81.8 kg, SpO2 95 %. General appearance: alert, cooperative and no distress Head: Normocephalic, without obvious abnormality, atraumatic Neck: no adenopathy, no carotid bruit, no JVD, supple, symmetrical, trachea midline and thyroid not enlarged, symmetric, no tenderness/mass/nodules Resp: clear to auscultation bilaterally and normal percussion bilaterally Extremities: extremities normal, atraumatic, no cyanosis or edema Incision/Wound:Left and right groin wounds are clean.    Disposition:    Allergies as of 12/03/2018      Reactions   Penicillins Rash, Other (See Comments)   Rash in and around the mouth Has patient had a PCN reaction causing immediate rash, facial/tongue/throat swelling, SOB or lightheadedness with hypotension: Yes Has patient had a PCN reaction causing severe rash involving mucus membranes or skin necrosis: Yes Has patient had a PCN reaction that required hospitalization: No Has patient had a PCN reaction occurring within the last 10 years: Yes If all of the above answers are "NO", then may proceed with Cephalosporin use.   Bactrim [sulfamethoxazole-trimethoprim] Other (See Comments)   Mouth sores, Sores develop in mouth      Medication List    TAKE these medications   ALLERGY EYE OP Place 1 drop into both eyes daily as needed (for allergies).    aspirin EC 81 MG tablet Take 81 mg by mouth daily.   CALCIUM PLUS VITAMIN D PO Take 1 tablet by mouth daily.   clopidogrel 75 MG tablet Commonly known as:  PLAVIX TAKE 1 TABLET BY MOUTH ONCE DAILY   docusate sodium 100 MG capsule Commonly known as:  COLACE Take 100 mg by mouth daily as needed.   famotidine 20 MG tablet Commonly known as:  PEPCID Take 20 mg by mouth daily as needed for heartburn or indigestion.   FISH OIL PO Take 1 capsule by mouth daily.   gabapentin 300 MG capsule Commonly known as:  NEURONTIN Take 1 capsule (300 mg total) by mouth 3 (three) times daily. What changed:    medication strength  how much to take  how to take this  when to take this  additional instructions   glyBURIDE 2.5 MG tablet Commonly known as:  DIABETA Take 1 tablet (2.5 mg total) by mouth 2 (two) times daily with a meal.   lisinopril 5 MG tablet Commonly known as:  PRINIVIL,ZESTRIL Take 1 tablet (5 mg total) by mouth daily.   lovastatin 10 MG tablet Commonly known as:  MEVACOR Take 1 tab  Po QHS What changed:    how much to take  how to take this  when to take this  additional instructions   metoprolol succinate 100 MG 24 hr tablet Commonly known as:  TOPROL-XL Take 1 tablet (100 mg total) by mouth daily. Take with or immediately following a meal.   mometasone-formoterol 100-5 MCG/ACT Aero Commonly known as:  DULERA Inhale 2 puffs into the lungs 2 (two) times daily as needed for wheezing.   mupirocin cream 2 % Commonly known as:  BACTROBAN Apply topically 2 (two) times  daily.   oxybutynin 5 MG tablet Commonly known as:  DITROPAN Take 1 tablet (5 mg total) by mouth 2 (two) times daily.   traMADol 50 MG tablet Commonly known as:  ULTRAM Take 1 tablet (50 mg total) by mouth every 6 (six) hours as needed for moderate pain or severe pain. What changed:  when to take this   vitamin B-12 500 MCG tablet Commonly known as:  CYANOCOBALAMIN Take 500 mcg by  mouth daily.      Follow-up Information    Dew, Erskine Squibb, MD. Schedule an appointment as soon as possible for a visit in 1 week(s).   Specialties:  Vascular Surgery, Radiology, Interventional Cardiology Contact information: [place] [address] [phone]           Signed: Elmore Guise 12/03/2018, 10:19 AM

## 2018-12-03 NOTE — Progress Notes (Signed)
Patient discharge teaching given, including activity, diet, follow-up appoints, and medications. Patient verbalized understanding of all discharge instructions. IV access was d/c'd. Vitals are stable. Skin is intact except as charted in most recent assessments. Pt to be escorted out by volunteer, to be driven home by family.  [name]  

## 2018-12-03 NOTE — Care Management Note (Signed)
Case Management Note  Patient Details  Name: RONIN REHFELDT MRN: [id number] Date of Birth: 03/20/1951  Subjective/Objective:   Dc with eliquis                  Action/Plan:   Expected Discharge Date:  12/03/18               Expected Discharge Plan:  Home/Self Care  In-House Referral:     Discharge planning Services  CM Consult, Medication Assistance  Post Acute Care Choice:    Choice offered to:     DME Arranged:    DME Agency:     HH Arranged:    HH Agency:     Status of Service:     If discussed at [employer], dates discussed:    Additional Comments:  Latanya Maudlin, RN 12/03/2018, 11:42 AM

## 2018-12-04 ENCOUNTER — Encounter: Payer: Self-pay | Admitting: Vascular Surgery

## 2018-12-05 ENCOUNTER — Telehealth (INDEPENDENT_AMBULATORY_CARE_PROVIDER_SITE_OTHER): Payer: Self-pay | Admitting: Vascular Surgery

## 2018-12-05 ENCOUNTER — Ambulatory Visit: Payer: Self-pay | Admitting: Adult Health

## 2018-12-05 LAB — SURGICAL PATHOLOGY

## 2018-12-05 NOTE — Telephone Encounter (Signed)
Pt has a appointment on 12-06-2018

## 2018-12-05 NOTE — Telephone Encounter (Signed)
Patient called asking about dressings for his groin wounds. He has not changed dressing since the procedure on 12/01/18. I spoke with Arna Medici, she request that patient be moved to follow up for wound check sooner than 12/12/18.   Can you please call the patient to move patient to be seen this week for wound check. Per Arna Medici put on either her schedule or [place]. AS, CMA

## 2018-12-06 ENCOUNTER — Encounter (INDEPENDENT_AMBULATORY_CARE_PROVIDER_SITE_OTHER): Payer: Self-pay | Admitting: Vascular Surgery

## 2018-12-06 ENCOUNTER — Other Ambulatory Visit: Payer: Self-pay

## 2018-12-06 ENCOUNTER — Ambulatory Visit (INDEPENDENT_AMBULATORY_CARE_PROVIDER_SITE_OTHER): Payer: Medicare Other | Admitting: Vascular Surgery

## 2018-12-06 VITALS — BP 114/66 | HR 71 | Resp 16 | Ht 70.0 in | Wt 180.0 lb

## 2018-12-06 DIAGNOSIS — Z9889 Other specified postprocedural states: Secondary | ICD-10-CM

## 2018-12-06 DIAGNOSIS — Z79899 Other long term (current) drug therapy: Secondary | ICD-10-CM

## 2018-12-06 DIAGNOSIS — E118 Type 2 diabetes mellitus with unspecified complications: Secondary | ICD-10-CM

## 2018-12-06 DIAGNOSIS — E782 Mixed hyperlipidemia: Secondary | ICD-10-CM

## 2018-12-06 DIAGNOSIS — I739 Peripheral vascular disease, unspecified: Secondary | ICD-10-CM

## 2018-12-06 NOTE — Progress Notes (Signed)
Subjective:    Patient ID: [name], male    DOB: 1951-01-06, 68 y.o.   MRN: [id number] Chief Complaint  Patient presents with  . Follow-up    [facility] 1week   Patient presents for his first postop follow-up.  The patient is status post: 11/30/18: 1. Ultrasound guidance for vascular access right femoral artery 2. Catheter placement into left profunda femorus artery from right femoral approach 3. Aortogram and selective left lower extremity angiogram 4. Catheter directed thrombolytic therapy with 4 mg of tpa to the left common femoral and profunda femorus artery 5. StarClose closure device right femoral artery  12/01/18: 1.   Left common femoral, profunda femoris, and superficial femoral artery endarterectomies and patch angioplasty  Patient presents today without complaint with the exception of some left groin soreness.  Patient presents with some postoperative questions concerning when he can shower and change his bandages.  Had a long conversation with the patient telling him that he should be showering every day and keeping his groins clean and dry.  He will be following up with podiatry for his left foot wound.  Patient denies any issues with his groin incision.  Denies any drainage from the incision.  Denies any fever, nausea vomiting.  Patient notes that he is able to "feel his toes".  States that he now has incisions in his left foot.  Review of Systems  Constitutional: Negative.   HENT: Negative.   Eyes: Negative.   Respiratory: Negative.   Cardiovascular:       PAD  Gastrointestinal: Negative.   Endocrine: Negative.   Genitourinary: Negative.   Musculoskeletal: Negative.   Skin: Negative.   Allergic/Immunologic: Negative.   Neurological: Negative.   Hematological: Negative.   Psychiatric/Behavioral: Negative.       Objective:   Physical Exam Vitals signs reviewed.  Constitutional:      Appearance: Normal appearance. He is normal weight.  HENT:     Head:  Normocephalic and atraumatic.     Right Ear: External ear normal.     Left Ear: External ear normal.     Nose: Nose normal.     Mouth/Throat:     Mouth: Mucous membranes are moist.     Pharynx: Oropharynx is clear.  Eyes:     Extraocular Movements: Extraocular movements intact.     Conjunctiva/sclera: Conjunctivae normal.     Pupils: Pupils are equal, round, and reactive to light.  Neck:     Musculoskeletal: Normal range of motion.  Cardiovascular:     Rate and Rhythm: Normal rate and regular rhythm.     Comments: Left lower extremity: Hard to palpate pedal pulses due to edema however the foot is warm and there is a good capillary refill. Pulmonary:     Effort: Pulmonary effort is normal.     Breath sounds: Normal breath sounds.  Musculoskeletal: Normal range of motion.        General: Swelling (Mild to moderate swelling to the left lower extremity) present.  Skin:    General: Skin is warm and dry.     Comments: Left foot wounds show signs of healing.  They are dry without drainage or erythema.  No cellulitis.  Left groin endarterectomy site: Intact clean and dry.  Dermabond is intact.  Surrounding skin is healthy.  No drainage, erythema.  Neurological:     General: No focal deficit present.     Mental Status: He is alert and oriented to person, place, and time. Mental status is  at baseline.  Psychiatric:        Mood and Affect: Mood normal.        Behavior: Behavior normal.        Thought Content: Thought content normal.        Judgment: Judgment normal.    BP 114/66 (BP Location: Right Arm)   Pulse 71   Resp 16   Ht 5\' 10"  (1.778 m)   Wt 180 lb (81.6 kg)   BMI 25.83 kg/m   Past Medical History:  Diagnosis Date  . Cholecystitis, chronic   . Cholelithiasis   . COPD (chronic obstructive pulmonary disease) ([place])   . Diabetes mellitus without complication ([place])   . Dyspnea   . Fatty liver 2008  . Fracture of clavicle 1992   left   . Fracture of ribs, multiple 1992    3 ribs  . GERD (gastroesophageal reflux disease)   . Hyperlipidemia   . Hypertension   . Paroxysmal SVT (supraventricular tachycardia) ([place])   . Pelvis fracture ([place]) 1992  . Peripheral vascular disease ([place])   . Pleurisy   . Pleurisy   . Seasonal allergies    Social History   Socioeconomic History  . Marital status: Single    Spouse name: Not on file  . Number of children: Not on file  . Years of education: Not on file  . Highest education level: Not on file  Occupational History  . Not on file  Social Needs  . Financial resource strain: Not on file  . Food insecurity:    Worry: Not on file    Inability: Not on file  . Transportation needs:    Medical: Not on file    Non-medical: Not on file  Tobacco Use  . Smoking status: Former Smoker    Last attempt to quit: 02/24/1997    Years since quitting: 21.7  . Smokeless tobacco: Never Used  Substance and Sexual Activity  . Alcohol use: No  . Drug use: No  . Sexual activity: Not on file  Lifestyle  . Physical activity:    Days per week: Not on file    Minutes per session: Not on file  . Stress: Not on file  Relationships  . Social connections:    Talks on phone: Not on file    Gets together: Not on file    Attends religious service: Not on file    Active member of club or organization: Not on file    Attends meetings of clubs or organizations: Not on file    Relationship status: Not on file  . Intimate partner violence:    Fear of current or ex partner: Not on file    Emotionally abused: Not on file    Physically abused: Not on file    Forced sexual activity: Not on file  Other Topics Concern  . Not on file  Social History Narrative  . Not on file   Past Surgical History:  Procedure Laterality Date  . AMPUTATION TOE Left 02/25/2017   Procedure: AMPUTATION TOE-LEFT 2ND MPJ;  Surgeon: Samara Deist, DPM;  Location: [facility] ORS;  Service: Podiatry;  Laterality: Left;  . AMPUTATION TOE Left 10/20/2018   Procedure:  TOE MPJ T2 LEFT;  Surgeon: Samara Deist, DPM;  Location: [facility] ORS;  Service: Podiatry;  Laterality: Left;  . BACK SURGERY  2008  . CHOLECYSTECTOMY    . COLONOSCOPY WITH PROPOFOL N/A 08/15/2017   Procedure: COLONOSCOPY WITH PROPOFOL;  Surgeon: Lollie Sails, MD;  Location: [facility] ENDOSCOPY;  Service: Endoscopy;  Laterality: N/A;  . ENDARTERECTOMY Right 05/12/2017   Procedure: ENDARTERECTOMY CAROTID;  Surgeon: [name], MD;  Location: [facility] ORS;  Service: Vascular;  Laterality: Right;  . ENDARTERECTOMY FEMORAL Left 12/01/2018   Procedure: ENDARTERECTOMY FEMORAL;  Surgeon: [name], MD;  Location: [facility] ORS;  Service: Vascular;  Laterality: Left;  . ERCP    . FOOT SURGERY Right    x 3  . LOWER EXTREMITY ANGIOGRAM Left 12/01/2018   Procedure: LOWER EXTREMITY ANGIOGRAM;  Surgeon: [name], MD;  Location: [facility] ORS;  Service: Vascular;  Laterality: Left;  . LOWER EXTREMITY ANGIOGRAPHY Left 10/30/2018   Procedure: LOWER EXTREMITY ANGIOGRAPHY;  Surgeon: [name], MD;  Location: [place] CV [facility];  Service: Cardiovascular;  Laterality: Left;  . LOWER EXTREMITY ANGIOGRAPHY Left 11/30/2018   Procedure: LOWER EXTREMITY ANGIOGRAPHY;  Surgeon: [name], MD;  Location: [place] CV [facility];  Service: Cardiovascular;  Laterality: Left;   Family History  Problem Relation Age of Onset  . Diabetes Sister    Allergies  Allergen Reactions  . Penicillins Rash and Other (See Comments)    Rash in and around the mouth Has patient had a PCN reaction causing immediate rash, facial/tongue/throat swelling, SOB or lightheadedness with hypotension: Yes Has patient had a PCN reaction causing severe rash involving mucus membranes or skin necrosis: Yes Has patient had a PCN reaction that required hospitalization: No Has patient had a PCN reaction occurring within the last 10 years: Yes If all of the above answers are "NO", then may proceed with Cephalosporin use.   . Bactrim  [Sulfamethoxazole-Trimethoprim] Other (See Comments)    Mouth sores, Sores develop in mouth      Assessment & Plan:  Patient presents for his first postop follow-up.  The patient is status post: 11/30/18: 1. Ultrasound guidance for vascular access right femoral artery 2. Catheter placement into left profunda femorus artery from right femoral approach 3. Aortogram and selective left lower extremity angiogram 4. Catheter directed thrombolytic therapy with 4 mg of tpa to the left common femoral and profunda femorus artery 5. StarClose closure device right femoral artery  12/01/18: 1.   Left common femoral, profunda femoris, and superficial femoral artery endarterectomies and patch angioplasty  Patient presents today without complaint with the exception of some left groin soreness.  Patient presents with some postoperative questions concerning when he can shower and change his bandages.  Had a long conversation with the patient telling him that he should be showering every day and keeping his groins clean and dry.  He will be following up with podiatry for his left foot wound.  Patient denies any issues with his groin incision.  Denies any drainage from the incision.  Denies any fever, nausea vomiting.  Patient notes that he is able to "feel his toes".  States that he now has incisions in his left foot.  1. PAD (peripheral artery disease) (HCC) - Stable That is post left femoral endarterectomy Presents for his first postop incision check Incision is healing well. Had a long conversation with the patient in regard to keeping his groins clean and dry.  The patient may shower. The patient is to follow-up in 1 month for an aorta iliac and left lower extremity arterial duplex to assess his arterial patency Urged the patient to elevate his legs which is possible for his edema Patient should keep his podiatry appointment for podiatry to follow his left foot wounds  -  VAS US AORTA/IVC/ILIACS; Future  - VAS [place] LOWER EXTREMITY ARTERIAL DUPLEX; Future  2. Type 2 diabetes mellitus with complication (HCC) - Stable On appropriate medications Encouraged good control as its slows the progression of atherosclerotic disease  3. Mixed hyperlipidemia - Stable On appropriate medications Encouraged good control as its slows the progression of atherosclerotic disease  Current Outpatient Medications on File Prior to Visit  Medication Sig Dispense Refill  . apixaban (ELIQUIS) 5 MG TABS tablet Take 1 tablet (5 mg total) by mouth 2 (two) times daily. 60 tablet 0  . aspirin EC 81 MG tablet Take 81 mg by mouth daily.    . Calcium Carbonate-Vitamin D (CALCIUM PLUS VITAMIN D PO) Take 1 tablet by mouth daily.    . clopidogrel (PLAVIX) 75 MG tablet TAKE 1 TABLET BY MOUTH ONCE DAILY (Patient taking differently: Take 75 mg by mouth daily. ) 90 tablet 3  . docusate sodium (COLACE) 100 MG capsule Take 100 mg by mouth daily as needed.     . famotidine (PEPCID) 20 MG tablet Take 20 mg by mouth daily as needed for heartburn or indigestion.    . gabapentin (NEURONTIN) 300 MG capsule Take 1 capsule (300 mg total) by mouth 3 (three) times daily. 90 capsule 3  . glyBURIDE (DIABETA) 2.5 MG tablet Take 1 tablet (2.5 mg total) by mouth 2 (two) times daily with a meal. 180 tablet 2  . lisinopril (PRINIVIL,ZESTRIL) 5 MG tablet Take 1 tablet (5 mg total) by mouth daily. 90 tablet 2  . lovastatin (MEVACOR) 10 MG tablet Take 1 tab  Po QHS (Patient taking differently: Take 10 mg by mouth at bedtime. ) 90 tablet 0  . metoprolol succinate (TOPROL-XL) 100 MG 24 hr tablet Take 1 tablet (100 mg total) by mouth daily. Take with or immediately following a meal. 30 tablet 0  . mometasone-formoterol (DULERA) 100-5 MCG/ACT AERO Inhale 2 puffs into the lungs 2 (two) times daily as needed for wheezing.    . mupirocin cream (BACTROBAN) 2 % Apply topically 2 (two) times daily. 15 g 0  . Naphazoline-Pheniramine (ALLERGY EYE OP) Place 1 drop into  both eyes daily as needed (for allergies).    . Omega-3 Fatty Acids (FISH OIL PO) Take 1 capsule by mouth daily.    Marland Kitchen oxybutynin (DITROPAN) 5 MG tablet Take 1 tablet (5 mg total) by mouth 2 (two) times daily. 60 tablet 3  . traMADol (ULTRAM) 50 MG tablet Take 1 tablet (50 mg total) by mouth every 6 (six) hours as needed for moderate pain or severe pain. 25 tablet 0  . vitamin B-12 (CYANOCOBALAMIN) 500 MCG tablet Take 500 mcg by mouth daily.      No current facility-administered medications on file prior to visit.    There are no Patient Instructions on file for this visit. No follow-ups on file.  Sharnelle Cappelli A Bronda Alfred, PA-C

## 2018-12-06 NOTE — Addendum Note (Signed)
Addendum  created 12/06/18 0810 by Doreen Salvage, CRNA   Charge Capture section accepted

## 2018-12-12 ENCOUNTER — Ambulatory Visit (INDEPENDENT_AMBULATORY_CARE_PROVIDER_SITE_OTHER): Payer: Medicare Other | Admitting: Vascular Surgery

## 2018-12-13 ENCOUNTER — Other Ambulatory Visit: Payer: Self-pay

## 2018-12-13 MED ORDER — LOVASTATIN 10 MG PO TABS: ORAL_TABLET | ORAL | 0 refills | Status: DC

## 2018-12-14 DIAGNOSIS — M2011 Hallux valgus (acquired), right foot: Secondary | ICD-10-CM | POA: Diagnosis not present

## 2018-12-14 DIAGNOSIS — Z89422 Acquired absence of other left toe(s): Secondary | ICD-10-CM | POA: Diagnosis not present

## 2018-12-14 DIAGNOSIS — I739 Peripheral vascular disease, unspecified: Secondary | ICD-10-CM | POA: Diagnosis not present

## 2018-12-14 DIAGNOSIS — L97524 Non-pressure chronic ulcer of other part of left foot with necrosis of bone: Secondary | ICD-10-CM | POA: Diagnosis not present

## 2018-12-18 ENCOUNTER — Other Ambulatory Visit: Payer: Self-pay

## 2018-12-18 DIAGNOSIS — E1165 Type 2 diabetes mellitus with hyperglycemia: Secondary | ICD-10-CM

## 2018-12-18 MED ORDER — GLYBURIDE 2.5 MG PO TABS: 2.5000 mg | ORAL_TABLET | Freq: Two times a day (BID) | ORAL | 2 refills | Status: DC

## 2018-12-19 ENCOUNTER — Other Ambulatory Visit: Payer: Self-pay

## 2018-12-19 MED ORDER — LOVASTATIN 10 MG PO TABS: ORAL_TABLET | ORAL | 0 refills | Status: DC

## 2018-12-27 DIAGNOSIS — L97523 Non-pressure chronic ulcer of other part of left foot with necrosis of muscle: Secondary | ICD-10-CM | POA: Diagnosis not present

## 2019-01-01 ENCOUNTER — Other Ambulatory Visit (INDEPENDENT_AMBULATORY_CARE_PROVIDER_SITE_OTHER): Payer: Self-pay | Admitting: Nurse Practitioner

## 2019-01-01 ENCOUNTER — Telehealth (INDEPENDENT_AMBULATORY_CARE_PROVIDER_SITE_OTHER): Payer: Self-pay

## 2019-01-02 ENCOUNTER — Other Ambulatory Visit (INDEPENDENT_AMBULATORY_CARE_PROVIDER_SITE_OTHER): Payer: Self-pay | Admitting: Nurse Practitioner

## 2019-01-02 ENCOUNTER — Telehealth (INDEPENDENT_AMBULATORY_CARE_PROVIDER_SITE_OTHER): Payer: Self-pay

## 2019-01-02 MED ORDER — APIXABAN 5 MG PO TABS: 5.0000 mg | ORAL_TABLET | Freq: Two times a day (BID) | ORAL | 2 refills | Status: DC

## 2019-01-02 NOTE — Telephone Encounter (Signed)
I sent in another Rx for eliquis.  This has 2 refills. Once he runs out of all refills he can stop eliquis.

## 2019-01-02 NOTE — Telephone Encounter (Signed)
Patient is aware with medical advice

## 2019-01-02 NOTE — Telephone Encounter (Signed)
Estill Bamberg from [facility] left a message stating that the patient will have to pay $424.10 with insurance for the medication Eliquis.I spoke with [facility] and we will give the patient some samples and afterwards the patient should continue just taking aspirin and clopidogrel.The patient is aware with medical information and will be coming by the office to pick up the samples

## 2019-01-08 ENCOUNTER — Encounter (INDEPENDENT_AMBULATORY_CARE_PROVIDER_SITE_OTHER): Payer: Medicare Other

## 2019-01-08 ENCOUNTER — Ambulatory Visit (INDEPENDENT_AMBULATORY_CARE_PROVIDER_SITE_OTHER): Payer: Medicare Other | Admitting: Nurse Practitioner

## 2019-01-10 ENCOUNTER — Other Ambulatory Visit: Payer: Self-pay | Admitting: Podiatry

## 2019-01-10 ENCOUNTER — Encounter (INDEPENDENT_AMBULATORY_CARE_PROVIDER_SITE_OTHER): Payer: Medicare Other

## 2019-01-10 ENCOUNTER — Ambulatory Visit (INDEPENDENT_AMBULATORY_CARE_PROVIDER_SITE_OTHER): Payer: Medicare Other | Admitting: Nurse Practitioner

## 2019-01-10 DIAGNOSIS — E1142 Type 2 diabetes mellitus with diabetic polyneuropathy: Secondary | ICD-10-CM | POA: Diagnosis not present

## 2019-01-10 DIAGNOSIS — B351 Tinea unguium: Secondary | ICD-10-CM | POA: Diagnosis not present

## 2019-01-10 DIAGNOSIS — I739 Peripheral vascular disease, unspecified: Secondary | ICD-10-CM | POA: Diagnosis not present

## 2019-01-10 DIAGNOSIS — L97523 Non-pressure chronic ulcer of other part of left foot with necrosis of muscle: Secondary | ICD-10-CM | POA: Diagnosis not present

## 2019-01-11 ENCOUNTER — Other Ambulatory Visit: Payer: Self-pay

## 2019-01-11 ENCOUNTER — Encounter
Admission: RE | Admit: 2019-01-11 | Discharge: 2019-01-11 | Disposition: A | Discharge status: Home / Self Care | Payer: Medicare Other | Source: Ambulatory Visit | Attending: Podiatry | Admitting: Podiatry

## 2019-01-11 DIAGNOSIS — Z7951 Long term (current) use of inhaled steroids: Secondary | ICD-10-CM | POA: Diagnosis not present

## 2019-01-11 DIAGNOSIS — L97523 Non-pressure chronic ulcer of other part of left foot with necrosis of muscle: Secondary | ICD-10-CM | POA: Diagnosis not present

## 2019-01-11 DIAGNOSIS — Z880 Allergy status to penicillin: Secondary | ICD-10-CM | POA: Diagnosis not present

## 2019-01-11 DIAGNOSIS — Z800 Family history of malignant neoplasm of digestive organs: Secondary | ICD-10-CM | POA: Diagnosis not present

## 2019-01-11 DIAGNOSIS — Z89422 Acquired absence of other left toe(s): Secondary | ICD-10-CM | POA: Diagnosis not present

## 2019-01-11 DIAGNOSIS — Z818 Family history of other mental and behavioral disorders: Secondary | ICD-10-CM | POA: Diagnosis not present

## 2019-01-11 DIAGNOSIS — I471 Supraventricular tachycardia: Secondary | ICD-10-CM | POA: Diagnosis not present

## 2019-01-11 DIAGNOSIS — E1151 Type 2 diabetes mellitus with diabetic peripheral angiopathy without gangrene: Secondary | ICD-10-CM | POA: Diagnosis not present

## 2019-01-11 DIAGNOSIS — Z7982 Long term (current) use of aspirin: Secondary | ICD-10-CM | POA: Diagnosis not present

## 2019-01-11 DIAGNOSIS — Z01812 Encounter for preprocedural laboratory examination: Secondary | ICD-10-CM | POA: Insufficient documentation

## 2019-01-11 DIAGNOSIS — Z7901 Long term (current) use of anticoagulants: Secondary | ICD-10-CM | POA: Diagnosis not present

## 2019-01-11 DIAGNOSIS — Z79899 Other long term (current) drug therapy: Secondary | ICD-10-CM | POA: Diagnosis not present

## 2019-01-11 DIAGNOSIS — Z9049 Acquired absence of other specified parts of digestive tract: Secondary | ICD-10-CM | POA: Diagnosis not present

## 2019-01-11 DIAGNOSIS — J449 Chronic obstructive pulmonary disease, unspecified: Secondary | ICD-10-CM | POA: Diagnosis not present

## 2019-01-11 DIAGNOSIS — Z833 Family history of diabetes mellitus: Secondary | ICD-10-CM | POA: Diagnosis not present

## 2019-01-11 DIAGNOSIS — Z7984 Long term (current) use of oral hypoglycemic drugs: Secondary | ICD-10-CM | POA: Diagnosis not present

## 2019-01-11 DIAGNOSIS — Z87891 Personal history of nicotine dependence: Secondary | ICD-10-CM | POA: Diagnosis not present

## 2019-01-11 DIAGNOSIS — E11621 Type 2 diabetes mellitus with foot ulcer: Secondary | ICD-10-CM | POA: Diagnosis not present

## 2019-01-11 DIAGNOSIS — E785 Hyperlipidemia, unspecified: Secondary | ICD-10-CM | POA: Diagnosis not present

## 2019-01-11 DIAGNOSIS — Z8249 Family history of ischemic heart disease and other diseases of the circulatory system: Secondary | ICD-10-CM | POA: Diagnosis not present

## 2019-01-11 DIAGNOSIS — K219 Gastro-esophageal reflux disease without esophagitis: Secondary | ICD-10-CM | POA: Diagnosis not present

## 2019-01-11 DIAGNOSIS — I10 Essential (primary) hypertension: Secondary | ICD-10-CM | POA: Diagnosis not present

## 2019-01-11 DIAGNOSIS — Z881 Allergy status to other antibiotic agents status: Secondary | ICD-10-CM | POA: Diagnosis not present

## 2019-01-11 DIAGNOSIS — K760 Fatty (change of) liver, not elsewhere classified: Secondary | ICD-10-CM | POA: Diagnosis not present

## 2019-01-11 LAB — CBC
HCT: 36.7 % — ABNORMAL LOW (ref 39.0–52.0)
Hemoglobin: 11.7 g/dL — ABNORMAL LOW (ref 13.0–17.0)
MCH: 27.9 pg (ref 26.0–34.0)
MCHC: 31.9 g/dL (ref 30.0–36.0)
MCV: 87.6 fL (ref 80.0–100.0)
Platelets: 143 10*3/uL — ABNORMAL LOW (ref 150–400)
RBC: 4.19 MIL/uL — ABNORMAL LOW (ref 4.22–5.81)
RDW: 14.2 % (ref 11.5–15.5)
WBC: 4.2 10*3/uL (ref 4.0–10.5)
nRBC: 0 % (ref 0.0–0.2)

## 2019-01-11 NOTE — Patient Instructions (Signed)
Your procedure is scheduled on: 01-12-19 FRIDAY Report to Same Day Surgery 2nd floor medical mall [facility] Entrance-take elevator on left to 2nd floor.  Check in with surgery information desk.) To find out your arrival time please call [phone] between 1PM - 3PM on 01-11-19 THURSDAY  Remember: Instructions that are not followed completely may result in serious medical risk, up to and including death, or upon the discretion of your surgeon and anesthesiologist your surgery may need to be rescheduled.    _x___ 1. Do not eat food after midnight the night before your procedure. NO GUM OR CANDY AFTER MIDNIGHT. You may drink WATER up to 2 hours before you are scheduled to arrive at the hospital for your procedure.  Do not drink WATER within 2 hours of your scheduled arrival to the hospital.  Type 1 and type 2 diabetics should only drink water.   ____Ensure clear carbohydrate drink on the way to the hospital for bariatric patients  _X___GATORADE 2 (G2) drink 3 hours before surgery     __x__ 2. No Alcohol for 24 hours before or after surgery.   __x__3. No Smoking or e-cigarettes for 24 prior to surgery.  Do not use any chewable tobacco products for at least 6 hour prior to surgery   ____  4. Bring all medications with you on the day of surgery if instructed.    __x__ 5. Notify your doctor if there is any change in your medical condition     (cold, fever, infections).    x___6. On the morning of surgery brush your teeth with toothpaste and water.  You may rinse your mouth with mouth wash if you wish.  Do not swallow any toothpaste or mouthwash.   Do not wear jewelry, make-up, hairpins, clips or nail polish.  Do not wear lotions, powders, or perfumes. You may wear deodorant.  Do not shave 48 hours prior to surgery. Men may shave face and neck.  Do not bring valuables to the hospital.    [facility] is not responsible for any belongings or valuables.               Contacts, dentures  or bridgework may not be worn into surgery.  Leave your suitcase in the car. After surgery it may be brought to your room.  For patients admitted to the hospital, discharge time is determined by your treatment team.  _  Patients discharged the day of surgery will not be allowed to drive home.  You will need someone to drive you home and stay with you the night of your procedure.    Please read over the following fact sheets that you were given:   [facility] Preparing for Surgery    _x___ TAKE THE FOLLOWING MEDICATION THE MORNING OF SURGERY WITH A SMALL SIP OF WATER. These include:  1. METOPROLOL  2. TRAMADOL  3. PEPCID  4. TAKE A PEPCID TONIGHT BEFORE BED  5.  6.  ____Fleets enema or Magnesium Citrate as directed.   _x___ Use CHG Soap or sage wipes as directed on instruction sheet   ____ Use inhalers on the day of surgery and bring to hospital day of surgery  ____ Stop Metformin and Janumet 2 days prior to surgery.    ____ Take 1/2 of usual insulin dose the night before surgery and none on the morning surgery.   _x___ Follow recommendations from Cardiologist, Pulmonologist or PCP regarding stopping Aspirin, Coumadin, Plavix ,Eliquis, Effient, or Pradaxa, and Pletal-PT STOPPED  ASA, PLAVIX AND ELIQUIS ON 01-10-19 PER DR FOWLER   X____Stop Anti-inflammatories such as Advil, Aleve, Ibuprofen, Motrin, Naproxen, Naprosyn, Goodies powders or aspirin products. OK to take Tylenol OR TRAMADOL IF NEEDED   _x___ Stop supplements until after surgery-STOP FISH OIL NOW   ____ Bring C-Pap to the hospital.

## 2019-01-12 ENCOUNTER — Ambulatory Visit: Payer: Medicare Other | Admitting: Anesthesiology

## 2019-01-12 ENCOUNTER — Other Ambulatory Visit: Payer: Self-pay

## 2019-01-12 ENCOUNTER — Ambulatory Visit
Admission: RE | Admit: 2019-01-12 | Discharge: 2019-01-12 | Disposition: A | Discharge status: Home / Self Care | Payer: Medicare Other | Attending: Podiatry | Admitting: Podiatry

## 2019-01-12 ENCOUNTER — Encounter
Admission: RE | Disposition: A | Discharge status: Home / Self Care | Payer: Self-pay | Source: Home / Self Care | Attending: Podiatry

## 2019-01-12 DIAGNOSIS — Z833 Family history of diabetes mellitus: Secondary | ICD-10-CM | POA: Insufficient documentation

## 2019-01-12 DIAGNOSIS — I471 Supraventricular tachycardia: Secondary | ICD-10-CM | POA: Insufficient documentation

## 2019-01-12 DIAGNOSIS — Z79899 Other long term (current) drug therapy: Secondary | ICD-10-CM | POA: Insufficient documentation

## 2019-01-12 DIAGNOSIS — Z7901 Long term (current) use of anticoagulants: Secondary | ICD-10-CM | POA: Insufficient documentation

## 2019-01-12 DIAGNOSIS — Z800 Family history of malignant neoplasm of digestive organs: Secondary | ICD-10-CM | POA: Insufficient documentation

## 2019-01-12 DIAGNOSIS — I10 Essential (primary) hypertension: Secondary | ICD-10-CM | POA: Insufficient documentation

## 2019-01-12 DIAGNOSIS — K760 Fatty (change of) liver, not elsewhere classified: Secondary | ICD-10-CM | POA: Insufficient documentation

## 2019-01-12 DIAGNOSIS — Z87891 Personal history of nicotine dependence: Secondary | ICD-10-CM | POA: Insufficient documentation

## 2019-01-12 DIAGNOSIS — E11621 Type 2 diabetes mellitus with foot ulcer: Secondary | ICD-10-CM | POA: Insufficient documentation

## 2019-01-12 DIAGNOSIS — E785 Hyperlipidemia, unspecified: Secondary | ICD-10-CM | POA: Insufficient documentation

## 2019-01-12 DIAGNOSIS — L97523 Non-pressure chronic ulcer of other part of left foot with necrosis of muscle: Secondary | ICD-10-CM | POA: Diagnosis not present

## 2019-01-12 DIAGNOSIS — E1151 Type 2 diabetes mellitus with diabetic peripheral angiopathy without gangrene: Secondary | ICD-10-CM | POA: Insufficient documentation

## 2019-01-12 DIAGNOSIS — Z881 Allergy status to other antibiotic agents status: Secondary | ICD-10-CM | POA: Insufficient documentation

## 2019-01-12 DIAGNOSIS — Z818 Family history of other mental and behavioral disorders: Secondary | ICD-10-CM | POA: Insufficient documentation

## 2019-01-12 DIAGNOSIS — Z7951 Long term (current) use of inhaled steroids: Secondary | ICD-10-CM | POA: Insufficient documentation

## 2019-01-12 DIAGNOSIS — Z880 Allergy status to penicillin: Secondary | ICD-10-CM | POA: Insufficient documentation

## 2019-01-12 DIAGNOSIS — K219 Gastro-esophageal reflux disease without esophagitis: Secondary | ICD-10-CM | POA: Insufficient documentation

## 2019-01-12 DIAGNOSIS — Z7984 Long term (current) use of oral hypoglycemic drugs: Secondary | ICD-10-CM | POA: Insufficient documentation

## 2019-01-12 DIAGNOSIS — Z89422 Acquired absence of other left toe(s): Secondary | ICD-10-CM | POA: Insufficient documentation

## 2019-01-12 DIAGNOSIS — J449 Chronic obstructive pulmonary disease, unspecified: Secondary | ICD-10-CM | POA: Insufficient documentation

## 2019-01-12 DIAGNOSIS — Z8249 Family history of ischemic heart disease and other diseases of the circulatory system: Secondary | ICD-10-CM | POA: Insufficient documentation

## 2019-01-12 DIAGNOSIS — Z7982 Long term (current) use of aspirin: Secondary | ICD-10-CM | POA: Insufficient documentation

## 2019-01-12 DIAGNOSIS — Z9049 Acquired absence of other specified parts of digestive tract: Secondary | ICD-10-CM | POA: Insufficient documentation

## 2019-01-12 HISTORY — PX: AMPUTATION TOE: SHX6595

## 2019-01-12 LAB — GLUCOSE, CAPILLARY
Glucose-Capillary: 93 mg/dL (ref 70–99)
Glucose-Capillary: 62 mg/dL — ABNORMAL LOW (ref 70–99)
Glucose-Capillary: 79 mg/dL (ref 70–99)
Glucose-Capillary: 115 mg/dL — ABNORMAL HIGH (ref 70–99)

## 2019-01-12 SURGERY — AMPUTATION, TOE
Anesthesia: General | Laterality: Left

## 2019-01-12 MED ORDER — THROMBIN 5000 UNITS EX SOLR
CUTANEOUS | Status: AC
  Filled 2019-01-12: qty 5000

## 2019-01-12 MED ORDER — LIDOCAINE HCL (PF) 2 % IJ SOLN
INTRAMUSCULAR | Status: AC
  Filled 2019-01-12: qty 10

## 2019-01-12 MED ORDER — SODIUM CHLORIDE 0.9 % IV SOLN
INTRAVENOUS | Status: DC
  Administered 2019-01-12: 11:00:00 via INTRAVENOUS

## 2019-01-12 MED ORDER — CLINDAMYCIN PHOSPHATE 900 MG/50ML IV SOLN
INTRAVENOUS | Status: AC
  Filled 2019-01-12: qty 50

## 2019-01-12 MED ORDER — THROMBIN 20000 UNITS EX KIT
PACK | CUTANEOUS | Status: AC
  Filled 2019-01-12: qty 1

## 2019-01-12 MED ORDER — PROPOFOL 10 MG/ML IV BOLUS
INTRAVENOUS | Status: AC
  Filled 2019-01-12: qty 20

## 2019-01-12 MED ORDER — ONDANSETRON HCL 4 MG/2ML IJ SOLN: 4.0000 mg | Freq: Four times a day (QID) | INTRAMUSCULAR | Status: DC | PRN

## 2019-01-12 MED ORDER — FENTANYL CITRATE (PF) 100 MCG/2ML IJ SOLN
INTRAMUSCULAR | Status: DC | PRN
  Administered 2019-01-12: 25 ug via INTRAVENOUS

## 2019-01-12 MED ORDER — BUPIVACAINE HCL 0.5 % IJ SOLN
INTRAMUSCULAR | Status: DC | PRN
  Administered 2019-01-12 (×2): 10 mL

## 2019-01-12 MED ORDER — FENTANYL CITRATE (PF) 100 MCG/2ML IJ SOLN: 25.0000 ug | INTRAMUSCULAR | Status: DC | PRN

## 2019-01-12 MED ORDER — MIDAZOLAM HCL 2 MG/2ML IJ SOLN
INTRAMUSCULAR | Status: DC | PRN
  Administered 2019-01-12: 2 mg via INTRAVENOUS

## 2019-01-12 MED ORDER — PHENYLEPHRINE HCL (PRESSORS) 10 MG/ML IV SOLN
INTRAVENOUS | Status: DC | PRN
  Administered 2019-01-12: 50 ug via INTRAVENOUS
  Administered 2019-01-12 (×3): 100 ug via INTRAVENOUS
  Administered 2019-01-12: 50 ug via INTRAVENOUS

## 2019-01-12 MED ORDER — CLINDAMYCIN PHOSPHATE 900 MG/50ML IV SOLN
900.0000 mg | INTRAVENOUS | Status: AC
  Administered 2019-01-12: 900 mg via INTRAVENOUS

## 2019-01-12 MED ORDER — HEMOSTATIC AGENTS (NO CHARGE) OPTIME
TOPICAL | Status: DC | PRN
  Administered 2019-01-12: 1 via TOPICAL

## 2019-01-12 MED ORDER — PROPOFOL 10 MG/ML IV BOLUS
INTRAVENOUS | Status: DC | PRN
  Administered 2019-01-12: 20 mg via INTRAVENOUS

## 2019-01-12 MED ORDER — OXYCODONE-ACETAMINOPHEN 5-325 MG PO TABS: 1.0000 | ORAL_TABLET | ORAL | 0 refills | Status: DC | PRN

## 2019-01-12 MED ORDER — BUPIVACAINE HCL (PF) 0.5 % IJ SOLN
INTRAMUSCULAR | Status: AC
  Filled 2019-01-12: qty 30

## 2019-01-12 MED ORDER — LIDOCAINE HCL (PF) 1 % IJ SOLN
INTRAMUSCULAR | Status: AC
  Filled 2019-01-12: qty 30

## 2019-01-12 MED ORDER — FENTANYL CITRATE (PF) 100 MCG/2ML IJ SOLN
INTRAMUSCULAR | Status: AC
  Filled 2019-01-12: qty 2

## 2019-01-12 MED ORDER — PROPOFOL 500 MG/50ML IV EMUL
INTRAVENOUS | Status: DC | PRN
  Administered 2019-01-12: 100 ug/kg/min via INTRAVENOUS

## 2019-01-12 MED ORDER — POVIDONE-IODINE 7.5 % EX SOLN
Freq: Once | CUTANEOUS | Status: DC
  Filled 2019-01-12: qty 118

## 2019-01-12 MED ORDER — PHENYLEPHRINE HCL (PRESSORS) 10 MG/ML IV SOLN
INTRAVENOUS | Status: AC
  Filled 2019-01-12: qty 1

## 2019-01-12 MED ORDER — MIDAZOLAM HCL 2 MG/2ML IJ SOLN
INTRAMUSCULAR | Status: AC
  Filled 2019-01-12: qty 2

## 2019-01-12 MED ORDER — ONDANSETRON HCL 4 MG PO TABS: 4.0000 mg | ORAL_TABLET | Freq: Four times a day (QID) | ORAL | Status: DC | PRN

## 2019-01-12 SURGICAL SUPPLY — 47 items
BANDAGE ELASTIC 4 LF NS (GAUZE/BANDAGES/DRESSINGS) ×2 IMPLANT
BLADE OSC/SAGITTAL 5.5X25 (BLADE) ×2 IMPLANT
BLADE OSC/SAGITTAL MD 5.5X18 (BLADE) IMPLANT
BLADE SURG MINI STRL (BLADE) IMPLANT
BNDG CONFORM 2 STRL LF (GAUZE/BANDAGES/DRESSINGS) ×2 IMPLANT
BNDG CONFORM 3 STRL LF (GAUZE/BANDAGES/DRESSINGS) ×4 IMPLANT
BNDG ESMARK 4X12 TAN STRL LF (GAUZE/BANDAGES/DRESSINGS) ×2 IMPLANT
BNDG GAUZE 4.5X4.1 6PLY STRL (MISCELLANEOUS) ×2 IMPLANT
CANISTER SUCT 1200ML W/VALVE (MISCELLANEOUS) ×2 IMPLANT
COVER WAND RF STERILE (DRAPES) IMPLANT
CUFF TOURN SGL QUICK 12 (TOURNIQUET CUFF) IMPLANT
CUFF TOURN SGL QUICK 18X4 (TOURNIQUET CUFF) IMPLANT
DRAPE FLUOR MINI C-ARM 54X84 (DRAPES) ×2 IMPLANT
DRAPE XRAY CASSETTE 23X24 (DRAPES) IMPLANT
DURAPREP 26ML APPLICATOR (WOUND CARE) ×2 IMPLANT
ELECT REM PT RETURN 9FT ADLT (ELECTROSURGICAL) ×2
ELECTRODE REM PT RTRN 9FT ADLT (ELECTROSURGICAL) ×1 IMPLANT
GAUZE PACKING IODOFORM 1/2 (PACKING) IMPLANT
GAUZE SPONGE 4X4 12PLY STRL (GAUZE/BANDAGES/DRESSINGS) ×2 IMPLANT
GAUZE XEROFORM 1X8 LF (GAUZE/BANDAGES/DRESSINGS) ×2 IMPLANT
GLOVE BIO SURGEON STRL SZ7.5 (GLOVE) ×2 IMPLANT
GLOVE INDICATOR 8.0 STRL GRN (GLOVE) ×2 IMPLANT
GOWN STRL REUS W/ TWL LRG LVL3 (GOWN DISPOSABLE) ×2 IMPLANT
GOWN STRL REUS W/TWL LRG LVL3 (GOWN DISPOSABLE) ×2
KIT TURNOVER KIT A (KITS) ×2 IMPLANT
LABEL OR SOLS (LABEL) ×2 IMPLANT
NEEDLE FILTER BLUNT 18X 1/2SAF (NEEDLE) ×1
NEEDLE FILTER BLUNT 18X1 1/2 (NEEDLE) ×1 IMPLANT
NEEDLE HYPO 25X1 1.5 SAFETY (NEEDLE) ×2 IMPLANT
NS IRRIG 500ML POUR BTL (IV SOLUTION) ×2 IMPLANT
PACK EXTREMITY ARMC (MISCELLANEOUS) ×2 IMPLANT
PAD ABD DERMACEA PRESS 5X9 (GAUZE/BANDAGES/DRESSINGS) ×4 IMPLANT
PULSAVAC PLUS IRRIG FAN TIP (DISPOSABLE)
SHIELD FULL FACE ANTIFOG 7M (MISCELLANEOUS) ×2 IMPLANT
SOL .9 NS 3000ML IRR  AL (IV SOLUTION)
SOL .9 NS 3000ML IRR UROMATIC (IV SOLUTION) IMPLANT
SPOGE SURGIFLO 8M (HEMOSTASIS) ×1
SPONGE SURGIFLO 8M (HEMOSTASIS) ×1 IMPLANT
STOCKINETTE M/LG 89821 (MISCELLANEOUS) ×2 IMPLANT
STRAP SAFETY 5IN WIDE (MISCELLANEOUS) ×2 IMPLANT
SUT ETHILON 2 0 FS 18 (SUTURE) ×2 IMPLANT
SUT ETHILON 3-0 FS-10 30 BLK (SUTURE) ×2
SUT ETHILON 5-0 FS-2 18 BLK (SUTURE) ×2 IMPLANT
SUT VIC AB 4-0 FS2 27 (SUTURE) ×2 IMPLANT
SUTURE EHLN 3-0 FS-10 30 BLK (SUTURE) ×1 IMPLANT
SYR 10ML LL (SYRINGE) ×6 IMPLANT
TIP FAN IRRIG PULSAVAC PLUS (DISPOSABLE) IMPLANT

## 2019-01-12 NOTE — Discharge Instructions (Signed)
AMBULATORY SURGERY  DISCHARGE INSTRUCTIONS   1) The drugs that you were given will stay in your system until tomorrow so for the next 24 hours you should not:  A) Drive an automobile B) Make any legal decisions C) Drink any alcoholic beverage   2) You may eat anything you prefer, but it is better to start with liquids, then soup and crackers, and gradually work up to solid foods.  3)   Please notify your doctor immediately if you have any unusual bleeding, trouble breathing, redness and pain at the surgery site, drainage, fever, or pain not relieved by medication.   4)  Additional Instructions: You may take Tylenol for discomfort.  Please keep your left foot elevated above the level of your heart.  Monitor your toes for discoloration.  If you see the toes beginning to become discolored/blueish then please call your doctor.  Make sure your shoe is on the surgical foot at all times, especially if ambulating.   Please contact your physician with any problems or Same Day Surgery at [phone], Monday through Friday 6 am to 4 pm, or [place] at [facility] number at [phone].

## 2019-01-12 NOTE — Anesthesia Post-op Follow-up Note (Signed)
Anesthesia QCDR form completed.        

## 2019-01-12 NOTE — Anesthesia Postprocedure Evaluation (Signed)
Anesthesia Post Note  Patient: [name]  Procedure(s) Performed: AMPUTATION LEFT 5TH TOE AND JOINT (Left )  Patient location during evaluation: PACU Anesthesia Type: General Level of consciousness: awake and alert Pain management: pain level controlled Vital Signs Assessment: post-procedure vital signs reviewed and stable Respiratory status: spontaneous breathing, nonlabored ventilation and respiratory function stable Cardiovascular status: blood pressure returned to baseline and stable Postop Assessment: no apparent nausea or vomiting Anesthetic complications: no     Last Vitals:  Vitals:   01/12/19 1430 01/12/19 1448  BP: 136/80 136/75  Pulse: 62 64  Resp: 13 15  Temp: (!) 36.1 C 36.4 C  SpO2: 97% 100%    Last Pain:  Vitals:   01/12/19 1448  TempSrc: Temporal  PainSc: 0-No pain                 Durenda Hurt

## 2019-01-12 NOTE — Transfer of Care (Signed)
Immediate Anesthesia Transfer of Care Note  Patient: [name]  Procedure(s) Performed: AMPUTATION LEFT 5TH TOE AND JOINT (Left )  Patient Location: PACU  Anesthesia Type:General  Level of Consciousness: drowsy and patient cooperative  Airway & Oxygen Therapy: Patient Spontanous Breathing and Patient connected to face mask oxygen  Post-op Assessment: Report given to RN and Post -op Vital signs reviewed and stable  Post vital signs: Reviewed and stable  Last Vitals:  Vitals Value Taken Time  BP 105/71 01/12/2019  1:38 PM  Temp    Pulse 57 01/12/2019  1:40 PM  Resp 14 01/12/2019  1:40 PM  SpO2 100 % 01/12/2019  1:40 PM  Vitals shown include unvalidated device data.  Last Pain:  Vitals:   01/12/19 1027  TempSrc: Oral  PainSc: 0-No pain         Complications: No apparent anesthesia complications

## 2019-01-12 NOTE — Anesthesia Preprocedure Evaluation (Signed)
Anesthesia Evaluation  Patient identified by MRN, date of birth, ID band Patient awake    Reviewed: Allergy & Precautions, H&P , NPO status , Patient's Chart, lab work & pertinent test results  Airway Mallampati: I  TM Distance: >3 FB     Dental  (+) Edentulous Lower, Edentulous Upper   Pulmonary shortness of breath, COPD, former smoker,           Cardiovascular hypertension, + Peripheral Vascular Disease       Neuro/Psych negative neurological ROS  negative psych ROS   GI/Hepatic Neg liver ROS, GERD  ,  Endo/Other  diabetes  Renal/GU negative Renal ROS  negative genitourinary   Musculoskeletal   Abdominal   Peds  Hematology negative hematology ROS (+)   Anesthesia Other Findings Past Medical History: No date: Cholecystitis, chronic No date: Cholelithiasis No date: COPD (chronic obstructive pulmonary disease) (HCC) No date: Diabetes mellitus without complication ([place]) No date: Dyspnea 2008: Fatty liver 1992: Fracture of clavicle     Comment:  left  1992: Fracture of ribs, multiple     Comment:  3 ribs No date: GERD (gastroesophageal reflux disease) No date: Hyperlipidemia No date: Hypertension No date: Paroxysmal SVT (supraventricular tachycardia) ([place]) 1992: Pelvis fracture ([place]) No date: Peripheral vascular disease ([place]) No date: Pleurisy No date: Pleurisy No date: Seasonal allergies  Past Surgical History: 02/25/2017: AMPUTATION TOE; Left     Comment:  Procedure: AMPUTATION TOE-LEFT 2ND MPJ;  Surgeon:               Samara Deist, DPM;  Location: [facility] ORS;  Service:               Podiatry;  Laterality: Left; 10/20/2018: AMPUTATION TOE; Left     Comment:  Procedure: TOE MPJ T2 LEFT;  Surgeon: Samara Deist,               DPM;  Location: [facility] ORS;  Service: Podiatry;                Laterality: Left; 2008: BACK SURGERY No date: CHOLECYSTECTOMY 08/15/2017: COLONOSCOPY WITH PROPOFOL; N/A     Comment:   Procedure: COLONOSCOPY WITH PROPOFOL;  Surgeon:               Lollie Sails, MD;  Location: [facility];                Service: Endoscopy;  Laterality: N/A; 05/12/2017: ENDARTERECTOMY; Right     Comment:  Procedure: ENDARTERECTOMY CAROTID;  Surgeon: [name], MD;  Location: [facility] ORS;  Service: Vascular;                Laterality: Right; 12/01/2018: ENDARTERECTOMY FEMORAL; Left     Comment:  Procedure: ENDARTERECTOMY FEMORAL;  Surgeon: [name], MD;  Location: [facility] ORS;  Service: Vascular;                Laterality: Left; No date: ERCP No date: FOOT SURGERY; Right     Comment:  x 3 12/01/2018: LOWER EXTREMITY ANGIOGRAM; Left     Comment:  Procedure: LOWER EXTREMITY ANGIOGRAM;  Surgeon: [name], MD;  Location: [facility] ORS;  Service: Vascular;  Laterality: Left; 10/30/2018: LOWER EXTREMITY ANGIOGRAPHY; Left     Comment:  Procedure: LOWER EXTREMITY ANGIOGRAPHY;  Surgeon: [name], MD;  Location: [place] CV LAB;  Service:               Cardiovascular;  Laterality: Left; 11/30/2018: LOWER EXTREMITY ANGIOGRAPHY; Left     Comment:  Procedure: LOWER EXTREMITY ANGIOGRAPHY;  Surgeon: [name], MD;  Location: [place] [facility] LAB;  Service:               Cardiovascular;  Laterality: Left;  BMI    Body Mass Index:  25.99 kg/m      Reproductive/Obstetrics negative OB ROS                             Anesthesia Physical Anesthesia Plan  ASA: III  Anesthesia Plan: General   Post-op Pain Management:    Induction:   PONV Risk Score and Plan: Propofol infusion and TIVA  Airway Management Planned: Simple Face Mask  Additional Equipment:   Intra-op Plan:   Post-operative Plan:   Informed Consent: I have reviewed the patients History and Physical, chart, labs and discussed the procedure including the risks, benefits and alternatives for the proposed  anesthesia with the patient or authorized representative who has indicated his/her understanding and acceptance.     Dental Advisory Given  Plan Discussed with: Anesthesiologist and CRNA  Anesthesia Plan Comments:         Anesthesia Quick Evaluation

## 2019-01-12 NOTE — H&P (Signed)
HISTORY AND PHYSICAL INTERVAL NOTE:  01/12/2019  12:17 PM  [name]  has presented today for surgery, with the diagnosis of L97.523 SKIN ULCER TOE LEFT FOOT WITH NECROSIS OF MUSCLE.  The various methods of treatment have been discussed with the patient.  No guarantees were given.  After consideration of risks, benefits and other options for treatment, the patient has consented to surgery.  I have reviewed the patients' chart and labs.     A history and physical examination was performed in my office.  The patient was reexamined.  There have been no changes to this history and physical examination.  Samara Deist A

## 2019-01-15 ENCOUNTER — Encounter: Payer: Self-pay | Admitting: Podiatry

## 2019-01-15 NOTE — Op Note (Signed)
Operative note   Surgeon:Deborah Dondero    Assistant:None    Preop diagnosis: Full-thickness nonhealing ulcer left fifth MTPJ    Postop diagnosis: Same    Procedure: Partial fifth ray amputation left foot    EBL: Minimal    Anesthesia:local and IV sedation    Hemostasis: None    Specimen: Tissue for culture and fifth ray ulceration for pathology    Complications: None    Operative indications:[name] is an 68 y.o. that presents today for surgical intervention.  The risks/benefits/alternatives/complications have been discussed and consent has been given.    Procedure:  Patient was brought into the OR and placed on the operating table in thesupine position. After anesthesia was obtained theleft lower extremity was prepped and draped in usual sterile fashion.  Attention was directed to the lateral aspect of the foot where a noted full-thickness ulceration with fibrotic tissue was found overlying the fifth MTPJ.  Incision was begun on the dorsal lateral aspect of the fifth metatarsal encompassing the ulceration and circumferential around the fifth toe.  Full-thickness incision was taken down to bone.  At approximately the level of the midshaft of the fifth metatarsal and osteotomy was created.  The fifth ray was then removed from the surgical field in toto.  The wound was flushed with copious amounts of irrigation.  All of the necrotic fibrotic tissue was excised.  The ulcer is self was full-thickness excised as well.  Recontouring of the skin flap was then performed for primary closure.  The wound was then closed primarily with a 3-0 nylon.  Bulky sterile dressing was applied.    Patient tolerated the procedure and anesthesia well.  Was transported from the OR to the PACU with all vital signs stable and vascular status intact. To be discharged per routine protocol.  Will follow up in approximately 1 week in the outpatient clinic.

## 2019-01-16 LAB — AEROBIC/ANAEROBIC CULTURE W GRAM STAIN (SURGICAL/DEEP WOUND)

## 2019-01-16 LAB — SURGICAL PATHOLOGY

## 2019-01-17 ENCOUNTER — Encounter: Payer: Self-pay | Admitting: Nurse Practitioner

## 2019-01-17 DIAGNOSIS — L97523 Non-pressure chronic ulcer of other part of left foot with necrosis of muscle: Secondary | ICD-10-CM | POA: Diagnosis not present

## 2019-01-24 DIAGNOSIS — L97321 Non-pressure chronic ulcer of left ankle limited to breakdown of skin: Secondary | ICD-10-CM | POA: Diagnosis not present

## 2019-01-24 DIAGNOSIS — L97523 Non-pressure chronic ulcer of other part of left foot with necrosis of muscle: Secondary | ICD-10-CM | POA: Diagnosis not present

## 2019-01-24 DIAGNOSIS — E1142 Type 2 diabetes mellitus with diabetic polyneuropathy: Secondary | ICD-10-CM | POA: Diagnosis not present

## 2019-01-24 DIAGNOSIS — I739 Peripheral vascular disease, unspecified: Secondary | ICD-10-CM | POA: Diagnosis not present

## 2019-01-31 DIAGNOSIS — L97523 Non-pressure chronic ulcer of other part of left foot with necrosis of muscle: Secondary | ICD-10-CM | POA: Diagnosis not present

## 2019-02-07 ENCOUNTER — Encounter (INDEPENDENT_AMBULATORY_CARE_PROVIDER_SITE_OTHER): Payer: Medicare Other

## 2019-02-07 ENCOUNTER — Ambulatory Visit (INDEPENDENT_AMBULATORY_CARE_PROVIDER_SITE_OTHER): Payer: Medicare Other | Admitting: Nurse Practitioner

## 2019-02-09 ENCOUNTER — Ambulatory Visit (INDEPENDENT_AMBULATORY_CARE_PROVIDER_SITE_OTHER): Payer: Medicare Other | Admitting: Nurse Practitioner

## 2019-02-09 ENCOUNTER — Encounter (INDEPENDENT_AMBULATORY_CARE_PROVIDER_SITE_OTHER): Payer: Self-pay | Admitting: Nurse Practitioner

## 2019-02-09 ENCOUNTER — Ambulatory Visit (INDEPENDENT_AMBULATORY_CARE_PROVIDER_SITE_OTHER): Payer: Medicare Other

## 2019-02-09 ENCOUNTER — Other Ambulatory Visit: Payer: Self-pay

## 2019-02-09 VITALS — BP 154/81 | HR 73 | Resp 16 | Ht 70.0 in | Wt 179.0 lb

## 2019-02-09 DIAGNOSIS — Z79899 Other long term (current) drug therapy: Secondary | ICD-10-CM

## 2019-02-09 DIAGNOSIS — Z95820 Peripheral vascular angioplasty status with implants and grafts: Secondary | ICD-10-CM | POA: Diagnosis not present

## 2019-02-09 DIAGNOSIS — I10 Essential (primary) hypertension: Secondary | ICD-10-CM

## 2019-02-09 DIAGNOSIS — Z9889 Other specified postprocedural states: Secondary | ICD-10-CM

## 2019-02-09 DIAGNOSIS — E118 Type 2 diabetes mellitus with unspecified complications: Secondary | ICD-10-CM

## 2019-02-09 DIAGNOSIS — I70262 Atherosclerosis of native arteries of extremities with gangrene, left leg: Secondary | ICD-10-CM

## 2019-02-09 DIAGNOSIS — I739 Peripheral vascular disease, unspecified: Secondary | ICD-10-CM

## 2019-02-09 DIAGNOSIS — Z87891 Personal history of nicotine dependence: Secondary | ICD-10-CM

## 2019-02-09 DIAGNOSIS — K219 Gastro-esophageal reflux disease without esophagitis: Secondary | ICD-10-CM

## 2019-02-09 NOTE — Progress Notes (Signed)
SUBJECTIVE:  Patient ID: [name], male    DOB: 06/20/1951, 68 y.o.   MRN: [id number] Chief Complaint  Patient presents with  . Follow-up    ultrasound follow up    HPI  [name] is a 68 y.o. male The patient returns to the office for followup and review of the noninvasive studies.  The patient underwent femoral endarterectomy on 12/01/2018, with left lower extremity angiogram on 10/30/2018.  There have been no interval changes in lower extremity symptoms. No interval shortening of the patient's claudication distance or development of rest pain symptoms.  The patient reports that the amputation of his left small toe is beginning to heal well.  No further ulceration or wounds.  There have been no significant changes to the patient's overall health care.  The patient denies amaurosis fugax or recent TIA symptoms. There are no recent neurological changes noted. The patient denies history of DVT, PE or superficial thrombophlebitis. The patient denies recent episodes of angina or shortness of breath.   ABI Rt=0.92 and Lt=0.97  (previous ABI's Rt=0.91 and Lt=0.46 on 11/24/2018) Duplex ultrasound of the iliac arteries reveals biphasic waveforms throughout bilaterally.  Vessels are patent.  Left lower extremity arterial duplex reveals triphasic waveforms to the mid SFA where it transitions to monophasic waveforms.  The stent is patent.  Flow was found down to the ankle.   Past Medical History:  Diagnosis Date  . Cholecystitis, chronic   . Cholelithiasis   . COPD (chronic obstructive pulmonary disease) ([place])   . Diabetes mellitus without complication ([place])   . Dyspnea   . Fatty liver 2008  . Fracture of clavicle 1992   left   . Fracture of ribs, multiple 1992   3 ribs  . GERD (gastroesophageal reflux disease)   . Hyperlipidemia   . Hypertension   . Paroxysmal SVT (supraventricular tachycardia) ([place])   . Pelvis fracture ([place]) 1992  . Peripheral vascular disease ([place])   .  Pleurisy   . Pleurisy   . Seasonal allergies     Past Surgical History:  Procedure Laterality Date  . AMPUTATION TOE Left 02/25/2017   Procedure: AMPUTATION TOE-LEFT 2ND MPJ;  Surgeon: Samara Deist, DPM;  Location: [facility] ORS;  Service: Podiatry;  Laterality: Left;  . AMPUTATION TOE Left 10/20/2018   Procedure: TOE MPJ T2 LEFT;  Surgeon: Samara Deist, DPM;  Location: [facility] ORS;  Service: Podiatry;  Laterality: Left;  . AMPUTATION TOE Left 01/12/2019   Procedure: AMPUTATION LEFT 5TH TOE AND JOINT;  Surgeon: Samara Deist, DPM;  Location: [facility] ORS;  Service: Podiatry;  Laterality: Left;  . BACK SURGERY  2008  . CHOLECYSTECTOMY    . COLONOSCOPY WITH PROPOFOL N/A 08/15/2017   Procedure: COLONOSCOPY WITH PROPOFOL;  Surgeon: Lollie Sails, MD;  Location: [facility] ENDOSCOPY;  Service: Endoscopy;  Laterality: N/A;  . ENDARTERECTOMY Right 05/12/2017   Procedure: ENDARTERECTOMY CAROTID;  Surgeon: [name], MD;  Location: [facility] ORS;  Service: Vascular;  Laterality: Right;  . ENDARTERECTOMY FEMORAL Left 12/01/2018   Procedure: ENDARTERECTOMY FEMORAL;  Surgeon: [name], MD;  Location: [facility] ORS;  Service: Vascular;  Laterality: Left;  . ERCP    . FOOT SURGERY Right    x 3  . LOWER EXTREMITY ANGIOGRAM Left 12/01/2018   Procedure: LOWER EXTREMITY ANGIOGRAM;  Surgeon: [name], MD;  Location: [facility] ORS;  Service: Vascular;  Laterality: Left;  . LOWER EXTREMITY ANGIOGRAPHY Left 10/30/2018   Procedure: LOWER EXTREMITY ANGIOGRAPHY;  Surgeon: Lucky Cowboy,  Erskine Squibb, MD;  Location: [place] [facility];  Service: Cardiovascular;  Laterality: Left;  . LOWER EXTREMITY ANGIOGRAPHY Left 11/30/2018   Procedure: LOWER EXTREMITY ANGIOGRAPHY;  Surgeon: [name], MD;  Location: [place] [facility];  Service: Cardiovascular;  Laterality: Left;    Social History   Socioeconomic History  . Marital status: Single    Spouse name: Not on file  . Number of children: Not on file  . Years of education: Not on file  .  Highest education level: Not on file  Occupational History  . Not on file  Social Needs  . Financial resource strain: Not on file  . Food insecurity:    Worry: Not on file    Inability: Not on file  . Transportation needs:    Medical: Not on file    Non-medical: Not on file  Tobacco Use  . Smoking status: Former Smoker    Last attempt to quit: 02/24/1997    Years since quitting: 21.9  . Smokeless tobacco: Never Used  Substance and Sexual Activity  . Alcohol use: No  . Drug use: No  . Sexual activity: Not on file  Lifestyle  . Physical activity:    Days per week: Not on file    Minutes per session: Not on file  . Stress: Not on file  Relationships  . Social connections:    Talks on phone: Not on file    Gets together: Not on file    Attends religious service: Not on file    Active member of club or organization: Not on file    Attends meetings of clubs or organizations: Not on file    Relationship status: Not on file  . Intimate partner violence:    Fear of current or ex partner: Not on file    Emotionally abused: Not on file    Physically abused: Not on file    Forced sexual activity: Not on file  Other Topics Concern  . Not on file  Social History Narrative  . Not on file    Family History  Problem Relation Age of Onset  . Diabetes Sister     Allergies  Allergen Reactions  . Penicillins Rash and Other (See Comments)    Rash in and around the mouth Has patient had a PCN reaction causing immediate rash, facial/tongue/throat swelling, SOB or lightheadedness with hypotension: Yes Has patient had a PCN reaction causing severe rash involving mucus membranes or skin necrosis: Yes Has patient had a PCN reaction that required hospitalization: No Has patient had a PCN reaction occurring within the last 10 years: Yes If all of the above answers are "NO", then may proceed with Cephalosporin use.   . Bactrim [Sulfamethoxazole-Trimethoprim] Other (See Comments)    Mouth  sores, Sores develop in mouth     Review of Systems   Review of Systems: Negative Unless Checked Constitutional: [] Weight loss  [] Fever  [] Chills Cardiac: [] Chest pain   []  Atrial Fibrillation  [] Palpitations   [] Shortness of breath when laying flat   [] Shortness of breath with exertion. [] Shortness of breath at rest Vascular:  [] Pain in legs with walking   [] Pain in legs with standing [] Pain in legs when laying flat   [] Claudication    [] Pain in feet when laying flat    [] History of DVT   [] Phlebitis   [x] Swelling in legs   [] Varicose veins   [] Non-healing ulcers Pulmonary:   [] Uses home oxygen   [] Productive cough   []   Hemoptysis   [] Wheeze  [x] COPD   [] Asthma Neurologic:  [] Dizziness   [] Seizures  [] Blackouts [] History of stroke   [] History of TIA  [] Aphasia   [] Temporary Blindness   [] Weakness or numbness in arm   [] Weakness or numbness in leg Musculoskeletal:   [] Joint swelling   [] Joint pain   [] Low back pain  []  History of Knee Replacement [] Arthritis [] back Surgeries  []  Spinal Stenosis    Hematologic:  [] Easy bruising  [] Easy bleeding   [] Hypercoagulable state   [] Anemic Gastrointestinal:  [] Diarrhea   [] Vomiting  [x] Gastroesophageal reflux/heartburn   [] Difficulty swallowing. [] Abdominal pain Genitourinary:  [] Chronic kidney disease   [] Difficult urination  [] Anuric   [] Blood in urine [] Frequent urination  [] Burning with urination   [] Hematuria Skin:  [] Rashes   [] Ulcers [x] Wounds Psychological:  [] History of anxiety   []  History of major depression  []  Memory Difficulties      OBJECTIVE:   Physical Exam  BP (!) 154/81 (BP Location: Right Arm)   Pulse 73   Resp 16   Ht 5\' 10"  (1.778 m)   Wt 179 lb (81.2 kg)   BMI 25.68 kg/m   Gen: WD/WN, NAD Head: /AT, No temporalis wasting.  Ear/Nose/Throat: Hearing grossly intact, nares w/o erythema or drainage Eyes: PER, EOMI, sclera nonicteric.  Neck: Supple, no masses.  No JVD.  Pulmonary:  Good air movement, no use of accessory  muscles.  Cardiac: RRR Vascular: left toe amputation Vessel Right Left  Radial Palpable Palpable  Dorsalis Pedis Palpable Trace Palpable  Posterior Tibial Palpable Trace Palpable   Gastrointestinal: soft, non-distended. No guarding/no peritoneal signs.  Musculoskeletal: M/S 5/5 throughout. Marland Kitchen  Neurologic: Pain and light touch intact in extremities.  Symmetrical.  Speech is fluent. Motor exam as listed above. Psychiatric: Judgment intact, Mood & affect appropriate for pt's clinical situation. Dermatologic: No Venous rashes. No Ulcers Noted.  No changes consistent with cellulitis. Lymph : No Cervical lymphadenopathy, no lichenification or skin changes of chronic lymphedema.       ASSESSMENT AND PLAN:  1. 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  The patient reports that the claudication symptoms and leg pain is essentially gone.   The patient denies lifestyle limiting changes at this point in time.  No further invasive studies, angiography or surgery at this time The patient should continue walking and begin a more formal exercise program.  The patient should continue antiplatelet therapy and aggressive treatment of the lipid abnormalities  Smoking cessation was again discussed  The patient should continue wearing graduated compression socks 10-15 mmHg strength to control the mild edema.  Patient should undergo noninvasive studies as ordered. The patient will follow up with me after the studies in 3 months.   2. Gastroesophageal reflux disease without esophagitis Continue PPI as already ordered, this medication has been reviewed and there are no changes at this time.  Avoidence of caffeine and alcohol  Moderate elevation of the head of the bed   3. Essential hypertension Continue antihypertensive medications as already ordered, these  medications have been reviewed and there are no changes at this time.   4. Type 2 diabetes mellitus with complication ([place]) Continue hypoglycemic medications as already ordered, these medications have been reviewed and there are no changes at this time.  Hgb A1C to be monitored as already arranged by primary service    Current Outpatient Medications on File Prior to  Visit  Medication Sig Dispense Refill  . aspirin EC 81 MG tablet Take 81 mg by mouth daily.    . Calcium Carbonate-Vitamin D (CALCIUM PLUS VITAMIN D PO) Take 1 tablet by mouth daily.    . ciprofloxacin (CIPRO) 500 MG tablet Take 500 mg by mouth 2 (two) times daily.    . clopidogrel (PLAVIX) 75 MG tablet TAKE 1 TABLET BY MOUTH ONCE DAILY 90 tablet 3  . diphenhydrAMINE (BENADRYL) 25 mg capsule Take 25 mg by mouth every 6 (six) hours as needed.    . docusate sodium (COLACE) 100 MG capsule Take 100 mg by mouth daily as needed.     . doxycycline (VIBRAMYCIN) 100 MG capsule Take 100 mg by mouth 2 (two) times daily.    . famotidine (PEPCID) 20 MG tablet Take 20 mg by mouth daily as needed for heartburn or indigestion.    . gabapentin (NEURONTIN) 300 MG capsule Take 1 capsule (300 mg total) by mouth 3 (three) times daily. 90 capsule 3  . glyBURIDE (DIABETA) 2.5 MG tablet Take 1 tablet (2.5 mg total) by mouth 2 (two) times daily with a meal. 180 tablet 2  . lisinopril (PRINIVIL,ZESTRIL) 5 MG tablet Take 1 tablet (5 mg total) by mouth daily. (Patient taking differently: Take 5 mg by mouth every morning. ) 90 tablet 2  . lovastatin (MEVACOR) 10 MG tablet Take 1 tab  Po QHS 90 tablet 0  . metoprolol succinate (TOPROL-XL) 100 MG 24 hr tablet Take 1 tablet (100 mg total) by mouth daily. Take with or immediately following a meal. (Patient taking differently: Take 100 mg by mouth every morning. Take with or immediately following a meal.) 30 tablet 0  . mometasone-formoterol (DULERA) 100-5 MCG/ACT AERO Inhale 2 puffs into the lungs 2 (two) times  daily as needed for wheezing.    Mable Fill (ALLERGY EYE OP) Place 1 drop into both eyes daily as needed (for allergies).    . Omega-3 Fatty Acids (FISH OIL PO) Take 1 capsule by mouth daily.    Marland Kitchen oxybutynin (DITROPAN) 5 MG tablet Take 1 tablet (5 mg total) by mouth 2 (two) times daily. (Patient taking differently: Take 5 mg by mouth at bedtime. ) 60 tablet 3  . oxyCODONE-acetaminophen (PERCOCET) 5-325 MG tablet Take 1 tablet by mouth every 4 (four) hours as needed for severe pain. 20 tablet 0  . traMADol (ULTRAM) 50 MG tablet Take 1 tablet (50 mg total) by mouth every 6 (six) hours as needed for moderate pain or severe pain. (Patient taking differently: Take 50 mg by mouth 2 (two) times daily. ) 25 tablet 0  . vitamin B-12 (CYANOCOBALAMIN) 500 MCG tablet Take 500 mcg by mouth daily.     Marland Kitchen apixaban (ELIQUIS) 5 MG TABS tablet Take 1 tablet (5 mg total) by mouth 2 (two) times daily. (Patient not taking: Reported on 02/09/2019) 60 tablet 2  . mupirocin cream (BACTROBAN) 2 % Apply topically 2 (two) times daily. (Patient not taking: Reported on 01/12/2019) 15 g 0   No current facility-administered medications on file prior to visit.     There are no Patient Instructions on file for this visit. Return in about 3 months (around 05/12/2019) for PAD.   Kris Hartmann, NP  This note was completed with Sales executive.  Any errors are purely unintentional.

## 2019-02-22 ENCOUNTER — Ambulatory Visit (INDEPENDENT_AMBULATORY_CARE_PROVIDER_SITE_OTHER): Payer: Medicare Other | Admitting: Adult Health

## 2019-02-22 ENCOUNTER — Other Ambulatory Visit: Payer: Self-pay

## 2019-02-22 ENCOUNTER — Encounter: Payer: Self-pay | Admitting: Adult Health

## 2019-02-22 VITALS — BP 101/74 | HR 63 | Resp 16 | Ht 70.0 in | Wt 180.0 lb

## 2019-02-22 DIAGNOSIS — E782 Mixed hyperlipidemia: Secondary | ICD-10-CM | POA: Diagnosis not present

## 2019-02-22 DIAGNOSIS — E1165 Type 2 diabetes mellitus with hyperglycemia: Secondary | ICD-10-CM

## 2019-02-22 DIAGNOSIS — I10 Essential (primary) hypertension: Secondary | ICD-10-CM | POA: Diagnosis not present

## 2019-02-22 DIAGNOSIS — K219 Gastro-esophageal reflux disease without esophagitis: Secondary | ICD-10-CM

## 2019-02-22 NOTE — Progress Notes (Signed)
[facility] [place], [place] [address]  Internal MEDICINE  Telephone Visit  Patient Name: [name]  [id number]  [id number]  Date of Service: 02/22/2019  I connected with the patient at 0855 by telephone and verified the patients identity using two identifiers.   I discussed the limitations, risks, security and privacy concerns of performing an evaluation and management service by telephone and the availability of in person appointments. I also discussed with the patient that there may be a patient responsible charge related to the service.  The patient expressed understanding and agrees to proceed.    Chief Complaint  Patient presents with  . Telephone Screen  . Diabetes  . Hypertension  . Hyperlipidemia  . Telephone Assessment    HPI  Pt seen via telephone visit for follow up on DM, HTN, and HLD. Pt reports he has not been taking his blood sugar because he has misplaced his meter.  He report his blood pressure has been steady, he Denies Chest pain, Shortness of breath, palpitations, headache, or blurred vision. He reports in April he was in the hospital to have  His 3rd toe removed from his left foot. He also had a vascular procedure to open his veins in bilateral lower legs.  He reports he is doing well since this procedure.      Current Medication: Outpatient Encounter Medications as of 02/22/2019  Medication Sig  . aspirin EC 81 MG tablet Take 81 mg by mouth daily.  . Calcium Carbonate-Vitamin D (CALCIUM PLUS VITAMIN D PO) Take 1 tablet by mouth daily.  . clopidogrel (PLAVIX) 75 MG tablet TAKE 1 TABLET BY MOUTH ONCE DAILY  . gabapentin (NEURONTIN) 300 MG capsule Take 1 capsule (300 mg total) by mouth 3 (three) times daily.  Marland Kitchen glyBURIDE (DIABETA) 2.5 MG tablet Take 1 tablet (2.5 mg total) by mouth 2 (two) times daily with a meal.  . lisinopril (PRINIVIL,ZESTRIL) 5 MG tablet Take 1 tablet (5 mg total) by mouth daily. (Patient taking differently: Take  5 mg by mouth every morning. )  . lovastatin (MEVACOR) 10 MG tablet Take 1 tab  Po QHS  . metoprolol succinate (TOPROL-XL) 100 MG 24 hr tablet Take 1 tablet (100 mg total) by mouth daily. Take with or immediately following a meal. (Patient taking differently: Take 100 mg by mouth every morning. Take with or immediately following a meal.)  . mometasone-formoterol (DULERA) 100-5 MCG/ACT AERO Inhale 2 puffs into the lungs 2 (two) times daily as needed for wheezing.  Mable Fill (ALLERGY EYE OP) Place 1 drop into both eyes daily as needed (for allergies).  . Omega-3 Fatty Acids (FISH OIL PO) Take 1 capsule by mouth daily.  Marland Kitchen oxybutynin (DITROPAN) 5 MG tablet Take 1 tablet (5 mg total) by mouth 2 (two) times daily. (Patient taking differently: Take 5 mg by mouth at bedtime. )  . oxyCODONE-acetaminophen (PERCOCET) 5-325 MG tablet Take 1 tablet by mouth every 4 (four) hours as needed for severe pain.  . traMADol (ULTRAM) 50 MG tablet Take 1 tablet (50 mg total) by mouth every 6 (six) hours as needed for moderate pain or severe pain. (Patient taking differently: Take 50 mg by mouth 2 (two) times daily. )  . vitamin B-12 (CYANOCOBALAMIN) 500 MCG tablet Take 500 mcg by mouth daily.   . [DISCONTINUED] apixaban (ELIQUIS) 5 MG TABS tablet Take 1 tablet (5 mg total) by mouth 2 (two) times daily. (Patient not taking: Reported on 02/09/2019)  . [DISCONTINUED]  ciprofloxacin (CIPRO) 500 MG tablet Take 500 mg by mouth 2 (two) times daily.  . [DISCONTINUED] diphenhydrAMINE (BENADRYL) 25 mg capsule Take 25 mg by mouth every 6 (six) hours as needed.  . [DISCONTINUED] docusate sodium (COLACE) 100 MG capsule Take 100 mg by mouth daily as needed.   . [DISCONTINUED] doxycycline (VIBRAMYCIN) 100 MG capsule Take 100 mg by mouth 2 (two) times daily.  . [DISCONTINUED] famotidine (PEPCID) 20 MG tablet Take 20 mg by mouth daily as needed for heartburn or indigestion.  . [DISCONTINUED] mupirocin cream (BACTROBAN) 2 %  Apply topically 2 (two) times daily. (Patient not taking: Reported on 01/12/2019)   No facility-administered encounter medications on file as of 02/22/2019.     Surgical History: Past Surgical History:  Procedure Laterality Date  . AMPUTATION TOE Left 02/25/2017   Procedure: AMPUTATION TOE-LEFT 2ND MPJ;  Surgeon: Samara Deist, DPM;  Location: [facility] ORS;  Service: Podiatry;  Laterality: Left;  . AMPUTATION TOE Left 10/20/2018   Procedure: TOE MPJ T2 LEFT;  Surgeon: Samara Deist, DPM;  Location: [facility] ORS;  Service: Podiatry;  Laterality: Left;  . AMPUTATION TOE Left 01/12/2019   Procedure: AMPUTATION LEFT 5TH TOE AND JOINT;  Surgeon: Samara Deist, DPM;  Location: [facility] ORS;  Service: Podiatry;  Laterality: Left;  . BACK SURGERY  2008  . CHOLECYSTECTOMY    . COLONOSCOPY WITH PROPOFOL N/A 08/15/2017   Procedure: COLONOSCOPY WITH PROPOFOL;  Surgeon: Lollie Sails, MD;  Location: [facility] END[facility]COPY;  Service: Endoscopy;  Laterality: N/A;  . ENDARTERECTOMY Right 05/12/2017   Procedure: ENDARTERECTOMY CAROTID;  Surgeon: [name], MD;  Location: [facility] ORS;  Service: Vascular;  Laterality: Right;  . ENDARTERECTOMY FEMORAL Left 12/01/2018   Procedure: ENDARTERECTOMY FEMORAL;  Surgeon: [name], MD;  Location: [facility] ORS;  Service: Vascular;  Laterality: Left;  . ERCP    . FOOT SURGERY Right    x 3  . LOWER EXTREMITY ANGIOGRAM Left 12/01/2018   Procedure: LOWER EXTREMITY ANGIOGRAM;  Surgeon: [name], MD;  Location: [facility] ORS;  Service: Vascular;  Laterality: Left;  . LOWER EXTREMITY ANGIOGRAPHY Left 10/30/2018   Procedure: LOWER EXTREMITY ANGIOGRAPHY;  Surgeon: [name], MD;  Location: [place] CV LAB;  Service: Cardiovascular;  Laterality: Left;  . LOWER EXTREMITY ANGIOGRAPHY Left 11/30/2018   Procedure: LOWER EXTREMITY ANGIOGRAPHY;  Surgeon: [name], MD;  Location: [place] CV LAB;  Service: Cardiovascular;  Laterality: Left;    Medical History: Past Medical History:   Diagnosis Date  . Cholecystitis, chronic   . Cholelithiasis   . COPD (chronic obstructive pulmonary disease) ([place])   . Diabetes mellitus without complication ([place])   . Dyspnea   . Fatty liver 2008  . Fracture of clavicle 1992   left   . Fracture of ribs, multiple 1992   3 ribs  . GERD (gastroesophageal reflux disease)   . Hyperlipidemia   . Hypertension   . Paroxysmal SVT (supraventricular tachycardia) ([place])   . Pelvis fracture ([place]) 1992  . Peripheral vascular disease ([place])   . Pleurisy   . Pleurisy   . Seasonal allergies     Family History: Family History  Problem Relation Age of Onset  . Diabetes Sister     Social History   Socioeconomic History  . Marital status: Single    Spouse name: Not on file  . Number of children: Not on file  . Years of education: Not on file  . Highest education level: Not on file  Occupational History  . Not on file  Social Needs  . Financial resource strain: Not on file  . Food insecurity:    Worry: Not on file    Inability: Not on file  . Transportation needs:    Medical: Not on file    Non-medical: Not on file  Tobacco Use  . Smoking status: Former Smoker    Last attempt to quit: 02/24/1997    Years since quitting: 22.0  . Smokeless tobacco: Never Used  Substance and Sexual Activity  . Alcohol use: No  . Drug use: No  . Sexual activity: Not on file  Lifestyle  . Physical activity:    Days per week: Not on file    Minutes per session: Not on file  . Stress: Not on file  Relationships  . Social connections:    Talks on phone: Not on file    Gets together: Not on file    Attends religious service: Not on file    Active member of club or organization: Not on file    Attends meetings of clubs or organizations: Not on file    Relationship status: Not on file  . Intimate partner violence:    Fear of current or ex partner: Not on file    Emotionally abused: Not on file    Physically abused: Not on file    Forced sexual  activity: Not on file  Other Topics Concern  . Not on file  Social History Narrative  . Not on file      Review of Systems  Constitutional: Negative.  Negative for chills, fatigue and unexpected weight change.  HENT: Negative.  Negative for congestion, rhinorrhea, sneezing and sore throat.   Eyes: Negative for redness.  Respiratory: Negative.  Negative for cough, chest tightness and shortness of breath.   Cardiovascular: Negative.  Negative for chest pain and palpitations.  Gastrointestinal: Negative.  Negative for abdominal pain, constipation, diarrhea, nausea and vomiting.  Endocrine: Negative.   Genitourinary: Negative.  Negative for dysuria and frequency.  Musculoskeletal: Negative.  Negative for arthralgias, back pain, joint swelling and neck pain.  Skin: Negative.  Negative for rash.  Allergic/Immunologic: Negative.   Neurological: Negative.  Negative for tremors and numbness.  Hematological: Negative for adenopathy. Does not bruise/bleed easily.  Psychiatric/Behavioral: Negative.  Negative for behavioral problems, sleep disturbance and suicidal ideas. The patient is not nervous/anxious.     Vital Signs: BP 101/74   Pulse 63   Resp 16   Ht 5\' 10"  (1.778 m)   Wt 180 lb (81.6 kg)   BMI 25.83 kg/m    Observation/Objective:  Pt speaking in full sentences, well sounding.    Assessment/Plan: 1. Uncontrolled type 2 diabetes mellitus with hyperglycemia ([place]) Encouraged patient to find meter and take his blood sugar regularly.  Will follow up in office in 8 weeks for A1C check.   2. Essential hypertension Stable, continue present therapy.   3. Gastroesophageal reflux disease without esophagitis Well controlled using otc medications intermittently. Continue present management.   4. Mixed hyperlipidemia Stable, continue medications.  Will recheck lipid panel at physical.   General Counseling: Truth verbalizes understanding of the findings of today's phone visit and  agrees with plan of treatment. I have discussed any further diagnostic evaluation that may be needed or ordered today. We also reviewed his medications today. he has been encouraged to call the office with any questions or concerns that should arise related to todays visit.    No orders  of the defined types were placed in this encounter.   No orders of the defined types were placed in this encounter.   Time spent: [place] [facility] Internal medicine

## 2019-03-07 DIAGNOSIS — E113212 Type 2 diabetes mellitus with mild nonproliferative diabetic retinopathy with macular edema, left eye: Secondary | ICD-10-CM | POA: Diagnosis not present

## 2019-03-13 ENCOUNTER — Encounter: Payer: Self-pay | Admitting: Adult Health

## 2019-03-19 ENCOUNTER — Other Ambulatory Visit: Payer: Self-pay

## 2019-03-19 DIAGNOSIS — B351 Tinea unguium: Secondary | ICD-10-CM | POA: Diagnosis not present

## 2019-03-19 DIAGNOSIS — M19011 Primary osteoarthritis, right shoulder: Secondary | ICD-10-CM

## 2019-03-19 DIAGNOSIS — E1142 Type 2 diabetes mellitus with diabetic polyneuropathy: Secondary | ICD-10-CM | POA: Diagnosis not present

## 2019-03-19 MED ORDER — TRAMADOL HCL 50 MG PO TABS: 50.0000 mg | ORAL_TABLET | Freq: Four times a day (QID) | ORAL | 0 refills | Status: DC | PRN

## 2019-03-19 NOTE — Telephone Encounter (Signed)
As per dr [name] called in tramadol 50 mg twice a daily as needed 60 with no refills

## 2019-04-19 ENCOUNTER — Encounter: Payer: Self-pay | Admitting: Adult Health

## 2019-04-19 ENCOUNTER — Other Ambulatory Visit: Payer: Self-pay

## 2019-04-19 ENCOUNTER — Ambulatory Visit (INDEPENDENT_AMBULATORY_CARE_PROVIDER_SITE_OTHER): Payer: Medicare Other | Admitting: Adult Health

## 2019-04-19 VITALS — BP 111/73 | HR 79 | Resp 16 | Ht 70.0 in | Wt 179.0 lb

## 2019-04-19 DIAGNOSIS — E1165 Type 2 diabetes mellitus with hyperglycemia: Secondary | ICD-10-CM | POA: Diagnosis not present

## 2019-04-19 DIAGNOSIS — K219 Gastro-esophageal reflux disease without esophagitis: Secondary | ICD-10-CM

## 2019-04-19 DIAGNOSIS — I10 Essential (primary) hypertension: Secondary | ICD-10-CM

## 2019-04-19 DIAGNOSIS — M19012 Primary osteoarthritis, left shoulder: Secondary | ICD-10-CM

## 2019-04-19 DIAGNOSIS — M19011 Primary osteoarthritis, right shoulder: Secondary | ICD-10-CM | POA: Diagnosis not present

## 2019-04-19 LAB — POCT GLYCOSYLATED HEMOGLOBIN (HGB A1C): Hemoglobin A1C: 7.6 % — AB (ref 4.0–5.6)

## 2019-04-19 MED ORDER — TRAMADOL HCL 50 MG PO TABS: 50.0000 mg | ORAL_TABLET | Freq: Four times a day (QID) | ORAL | 0 refills | Status: DC | PRN

## 2019-04-19 NOTE — Progress Notes (Signed)
[facility] [place], [place] [address]  Internal MEDICINE  Office Visit Note  Patient Name: [name]  [id number]  [id number]  Date of Service: 04/19/2019  Chief Complaint  Patient presents with  . Medical Management of Chronic Issues    8 week follow up   . Diabetes    HPI  Pt is here for follow up on DM. His A1C today is 7.6  The last time he had it checked in our office it was 7.5 in jan 2020.  Overall he is maintaining.  He denies any high or low sugars.  He is watching his diet, and attempting to walk for exercise when the weather cooperates.     Current Medication: Outpatient Encounter Medications as of 04/19/2019  Medication Sig  . aspirin EC 81 MG tablet Take 81 mg by mouth daily.  . Calcium Carbonate-Vitamin D (CALCIUM PLUS VITAMIN D PO) Take 1 tablet by mouth daily.  . clopidogrel (PLAVIX) 75 MG tablet TAKE 1 TABLET BY MOUTH ONCE DAILY  . gabapentin (NEURONTIN) 300 MG capsule Take 1 capsule (300 mg total) by mouth 3 (three) times daily.  Marland Kitchen glyBURIDE (DIABETA) 2.5 MG tablet Take 1 tablet (2.5 mg total) by mouth 2 (two) times daily with a meal.  . lisinopril (PRINIVIL,ZESTRIL) 5 MG tablet Take 1 tablet (5 mg total) by mouth daily. (Patient taking differently: Take 5 mg by mouth every morning. )  . lovastatin (MEVACOR) 10 MG tablet Take 1 tab  Po QHS  . metoprolol succinate (TOPROL-XL) 100 MG 24 hr tablet Take 1 tablet (100 mg total) by mouth daily. Take with or immediately following a meal. (Patient taking differently: Take 100 mg by mouth every morning. Take with or immediately following a meal.)  . mometasone-formoterol (DULERA) 100-5 MCG/ACT AERO Inhale 2 puffs into the lungs 2 (two) times daily as needed for wheezing.  Mable Fill (ALLERGY EYE OP) Place 1 drop into both eyes daily as needed (for allergies).  . Omega-3 Fatty Acids (FISH OIL PO) Take 1 capsule by mouth daily.  Marland Kitchen oxybutynin (DITROPAN) 5 MG tablet Take 1 tablet (5  mg total) by mouth 2 (two) times daily. (Patient taking differently: Take 5 mg by mouth at bedtime. )  . oxyCODONE-acetaminophen (PERCOCET) 5-325 MG tablet Take 1 tablet by mouth every 4 (four) hours as needed for severe pain.  . traMADol (ULTRAM) 50 MG tablet Take 1 tablet (50 mg total) by mouth every 6 (six) hours as needed for moderate pain or severe pain.  . vitamin B-12 (CYANOCOBALAMIN) 500 MCG tablet Take 500 mcg by mouth daily.    No facility-administered encounter medications on file as of 04/19/2019.     Surgical History: Past Surgical History:  Procedure Laterality Date  . AMPUTATION TOE Left 02/25/2017   Procedure: AMPUTATION TOE-LEFT 2ND MPJ;  Surgeon: Samara Deist, DPM;  Location: [facility] ORS;  Service: Podiatry;  Laterality: Left;  . AMPUTATION TOE Left 10/20/2018   Procedure: TOE MPJ T2 LEFT;  Surgeon: Samara Deist, DPM;  Location: [facility] ORS;  Service: Podiatry;  Laterality: Left;  . AMPUTATION TOE Left 01/12/2019   Procedure: AMPUTATION LEFT 5TH TOE AND JOINT;  Surgeon: Samara Deist, DPM;  Location: [facility] ORS;  Service: Podiatry;  Laterality: Left;  . BACK SURGERY  2008  . CHOLECYSTECTOMY    . COLONOSCOPY WITH PROPOFOL N/A 08/15/2017   Procedure: COLONOSCOPY WITH PROPOFOL;  Surgeon: Lollie Sails, MD;  Location: [facility] ENDOSCOPY;  Service: Endoscopy;  Laterality: N/A;  .  ENDARTERECTOMY Right 05/12/2017   Procedure: ENDARTERECTOMY CAROTID;  Surgeon: [name], MD;  Location: [facility] ORS;  Service: Vascular;  Laterality: Right;  . ENDARTERECTOMY FEMORAL Left 12/01/2018   Procedure: ENDARTERECTOMY FEMORAL;  Surgeon: [name], MD;  Location: [facility] ORS;  Service: Vascular;  Laterality: Left;  . ERCP    . FOOT SURGERY Right    x 3  . LOWER EXTREMITY ANGIOGRAM Left 12/01/2018   Procedure: LOWER EXTREMITY ANGIOGRAM;  Surgeon: [name], MD;  Location: [facility] ORS;  Service: Vascular;  Laterality: Left;  . LOWER EXTREMITY ANGIOGRAPHY Left 10/30/2018   Procedure: LOWER EXTREMITY  ANGIOGRAPHY;  Surgeon: [name], MD;  Location: [place] CV LAB;  Service: Cardiovascular;  Laterality: Left;  . LOWER EXTREMITY ANGIOGRAPHY Left 11/30/2018   Procedure: LOWER EXTREMITY ANGIOGRAPHY;  Surgeon: [name], MD;  Location: [place] CV LAB;  Service: Cardiovascular;  Laterality: Left;    Medical History: Past Medical History:  Diagnosis Date  . Cholecystitis, chronic   . Cholelithiasis   . COPD (chronic obstructive pulmonary disease) ([place])   . Diabetes mellitus without complication ([place])   . Diabetic retinopathy ([place])   . Dyspnea   . Fatty liver 2008  . Fracture of clavicle 1992   left   . Fracture of ribs, multiple 1992   3 ribs  . GERD (gastroesophageal reflux disease)   . Hyperlipidemia   . Hypertension   . Paroxysmal SVT (supraventricular tachycardia) ([place])   . Pelvis fracture ([place]) 1992  . Peripheral vascular disease ([place])   . Pleurisy   . Pleurisy   . Seasonal allergies     Family History: Family History  Problem Relation Age of Onset  . Diabetes Sister     Social History   Socioeconomic History  . Marital status: Single    Spouse name: Not on file  . Number of children: Not on file  . Years of education: Not on file  . Highest education level: Not on file  Occupational History  . Not on file  Social Needs  . Financial resource strain: Not on file  . Food insecurity    Worry: Not on file    Inability: Not on file  . Transportation needs    Medical: Not on file    Non-medical: Not on file  Tobacco Use  . Smoking status: Former Smoker    Quit date: 02/24/1997    Years since quitting: 22.1  . Smokeless tobacco: Never Used  Substance and Sexual Activity  . Alcohol use: No  . Drug use: No  . Sexual activity: Not on file  Lifestyle  . Physical activity    Days per week: Not on file    Minutes per session: Not on file  . Stress: Not on file  Relationships  . Social Herbalist on phone: Not on file    Gets  together: Not on file    Attends religious service: Not on file    Active member of club or organization: Not on file    Attends meetings of clubs or organizations: Not on file    Relationship status: Not on file  . Intimate partner violence    Fear of current or ex partner: Not on file    Emotionally abused: Not on file    Physically abused: Not on file    Forced sexual activity: Not on file  Other Topics Concern  . Not on file  Social History Narrative  .  Not on file      Review of Systems  Constitutional: Negative.  Negative for chills, fatigue and unexpected weight change.  HENT: Negative.  Negative for congestion, rhinorrhea, sneezing and sore throat.   Eyes: Negative for redness.  Respiratory: Negative.  Negative for cough, chest tightness and shortness of breath.   Cardiovascular: Negative.  Negative for chest pain and palpitations.  Gastrointestinal: Negative.  Negative for abdominal pain, constipation, diarrhea, nausea and vomiting.  Endocrine: Negative.   Genitourinary: Negative.  Negative for dysuria and frequency.  Musculoskeletal: Negative.  Negative for arthralgias, back pain, joint swelling and neck pain.  Skin: Negative.  Negative for rash.  Allergic/Immunologic: Negative.   Neurological: Negative.  Negative for tremors and numbness.  Hematological: Negative for adenopathy. Does not bruise/bleed easily.  Psychiatric/Behavioral: Negative.  Negative for behavioral problems, sleep disturbance and suicidal ideas. The patient is not nervous/anxious.     Vital Signs: BP 111/73   Pulse 79   Resp 16   Ht 5\' 10"  (1.778 m)   Wt 179 lb (81.2 kg)   SpO2 93%   BMI 25.68 kg/m    Physical Exam Vitals signs and nursing note reviewed.  Constitutional:      General: He is not in acute distress.    Appearance: He is well-developed. He is not diaphoretic.  HENT:     Head: Normocephalic and atraumatic.     Mouth/Throat:     Pharynx: No oropharyngeal exudate.  Eyes:      Pupils: Pupils are equal, round, and reactive to light.  Neck:     Musculoskeletal: Normal range of motion and neck supple.     Thyroid: No thyromegaly.     Vascular: No JVD.     Trachea: No tracheal deviation.  Cardiovascular:     Rate and Rhythm: Normal rate and regular rhythm.     Heart sounds: Normal heart sounds. No murmur. No friction rub. No gallop.   Pulmonary:     Effort: Pulmonary effort is normal. No respiratory distress.     Breath sounds: Normal breath sounds. No wheezing or rales.  Chest:     Chest wall: No tenderness.  Abdominal:     Palpations: Abdomen is soft.     Tenderness: There is no abdominal tenderness. There is no guarding.  Musculoskeletal: Normal range of motion.  Lymphadenopathy:     Cervical: No cervical adenopathy.  Skin:    General: Skin is warm and dry.  Neurological:     Mental Status: He is alert and oriented to person, place, and time.     Cranial Nerves: No cranial nerve deficit.  Psychiatric:        Behavior: Behavior normal.        Thought Content: Thought content normal.        Judgment: Judgment normal.     Assessment/Plan: 1. Uncontrolled type 2 diabetes mellitus with hyperglycemia (HCC) 7.6 today.  Continue to watch carbohydrate intake, and simple sugars.  Gradually increase physical activity.  - POCT HgB A1C  2. Essential hypertension Stable at this time.  Pt reports he is not taking his lisinopril currently, due to Low BP.  Continue to monitor.   3. Gastroesophageal reflux disease without esophagitis Stable, continue present mgmt.   4. Primary osteoarthritis of shoulders, bilateral Reviewed risks and possible side effects associated with taking opiates, benzodiazepines and other CNS depressants. Combination of these could cause dizziness and drowsiness. Advised patient not to drive or operate machinery when taking these medications, as  patient's and other's life can be at risk and will have consequences. Patient verbalized  understanding in this matter. Dependence and abuse for these drugs will be monitored closely. A Controlled substance policy and procedure is on file which allows [facility] to order a urine drug screen test at any visit. Patient understands and agrees with the plan - traMADol (ULTRAM) 50 MG tablet; Take 1 tablet (50 mg total) by mouth every 6 (six) hours as needed for moderate pain or severe pain.  Dispense: 60 tablet; Refill: 0  General Counseling: Kaymen verbalizes understanding of the findings of todays visit and agrees with plan of treatment. I have discussed any further diagnostic evaluation that may be needed or ordered today. We also reviewed his medications today. he has been encouraged to call the office with any questions or concerns that should arise related to todays visit.    Orders Placed This Encounter  Procedures  . POCT HgB A1C    No orders of the defined types were placed in this encounter.   Time spent: 15 Minutes   This patient was seen by Orson Gear AGNP-C in Collaboration with Dr [name] as a part of collaborative care agreement     Kendell Bane AGNP-C Internal medicine

## 2019-04-20 ENCOUNTER — Other Ambulatory Visit: Payer: Self-pay | Admitting: Internal Medicine

## 2019-04-20 DIAGNOSIS — M19011 Primary osteoarthritis, right shoulder: Secondary | ICD-10-CM

## 2019-05-15 DIAGNOSIS — I428 Other cardiomyopathies: Secondary | ICD-10-CM | POA: Diagnosis not present

## 2019-05-15 DIAGNOSIS — E785 Hyperlipidemia, unspecified: Secondary | ICD-10-CM | POA: Diagnosis not present

## 2019-05-15 DIAGNOSIS — I10 Essential (primary) hypertension: Secondary | ICD-10-CM | POA: Diagnosis not present

## 2019-05-15 DIAGNOSIS — I471 Supraventricular tachycardia: Secondary | ICD-10-CM | POA: Diagnosis not present

## 2019-05-17 ENCOUNTER — Other Ambulatory Visit: Payer: Self-pay

## 2019-05-17 MED ORDER — GABAPENTIN 300 MG PO CAPS: 300.0000 mg | ORAL_CAPSULE | Freq: Three times a day (TID) | ORAL | 3 refills | Status: DC

## 2019-05-18 ENCOUNTER — Other Ambulatory Visit: Payer: Self-pay

## 2019-05-18 ENCOUNTER — Ambulatory Visit (INDEPENDENT_AMBULATORY_CARE_PROVIDER_SITE_OTHER): Payer: Medicare Other

## 2019-05-18 ENCOUNTER — Encounter (INDEPENDENT_AMBULATORY_CARE_PROVIDER_SITE_OTHER): Payer: Self-pay | Admitting: Vascular Surgery

## 2019-05-18 ENCOUNTER — Ambulatory Visit (INDEPENDENT_AMBULATORY_CARE_PROVIDER_SITE_OTHER): Payer: Medicare Other | Admitting: Vascular Surgery

## 2019-05-18 VITALS — BP 156/97 | HR 73 | Resp 12 | Ht 70.0 in | Wt 175.0 lb

## 2019-05-18 DIAGNOSIS — I6523 Occlusion and stenosis of bilateral carotid arteries: Secondary | ICD-10-CM

## 2019-05-18 DIAGNOSIS — I70262 Atherosclerosis of native arteries of extremities with gangrene, left leg: Secondary | ICD-10-CM

## 2019-05-18 DIAGNOSIS — E782 Mixed hyperlipidemia: Secondary | ICD-10-CM

## 2019-05-18 DIAGNOSIS — E1151 Type 2 diabetes mellitus with diabetic peripheral angiopathy without gangrene: Secondary | ICD-10-CM | POA: Diagnosis not present

## 2019-05-18 DIAGNOSIS — I10 Essential (primary) hypertension: Secondary | ICD-10-CM | POA: Diagnosis not present

## 2019-05-18 DIAGNOSIS — I739 Peripheral vascular disease, unspecified: Secondary | ICD-10-CM | POA: Diagnosis not present

## 2019-05-18 NOTE — Progress Notes (Signed)
MRN : [id number]  [name] is a 68 y.o. ([birth year]) male who presents with chief complaint of  Chief Complaint  Patient presents with  . Follow-up  .  History of Present Illness: Patient returns today in follow up of of his PAD.  He is doing well.  After debridements by podiatry his left foot ulcerations have healed.  This was also after revascularization.  He reports no current issues.  He does still have neuropathic pain in his feet which is treated reasonably well with gabapentin.  His studies today show no focal stenosis on duplex in the left lower extremity with ABIs of 0.84 on the right and 0.80 on the left with biphasic waveforms.  His digit pressures are over 100 bilaterally.  Current Outpatient Medications  Medication Sig Dispense Refill  . aspirin EC 81 MG tablet Take 81 mg by mouth daily.    . Calcium Carbonate-Vitamin D (CALCIUM PLUS VITAMIN D PO) Take 1 tablet by mouth daily.    . clopidogrel (PLAVIX) 75 MG tablet TAKE 1 TABLET BY MOUTH ONCE DAILY 90 tablet 3  . gabapentin (NEURONTIN) 300 MG capsule Take 1 capsule (300 mg total) by mouth 3 (three) times daily. 90 capsule 3  . glyBURIDE (DIABETA) 2.5 MG tablet Take 1 tablet (2.5 mg total) by mouth 2 (two) times daily with a meal. 180 tablet 2  . lovastatin (MEVACOR) 10 MG tablet Take 1 tab  Po QHS 90 tablet 0  . metoprolol succinate (TOPROL-XL) 100 MG 24 hr tablet Take 1 tablet (100 mg total) by mouth daily. Take with or immediately following a meal. (Patient taking differently: Take 100 mg by mouth every morning. Take with or immediately following a meal.) 30 tablet 0  . mometasone-formoterol (DULERA) 100-5 MCG/ACT AERO Inhale 2 puffs into the lungs 2 (two) times daily as needed for wheezing.    Mable Fill (ALLERGY EYE OP) Place 1 drop into both eyes daily as needed (for allergies).    . Omega-3 Fatty Acids (FISH OIL PO) Take 1 capsule by mouth daily.    Marland Kitchen oxybutynin (DITROPAN) 5 MG tablet Take 1 tablet (5  mg total) by mouth 2 (two) times daily. (Patient taking differently: Take 5 mg by mouth at bedtime. ) 60 tablet 3  . traMADol (ULTRAM) 50 MG tablet Take 1 tablet (50 mg total) by mouth every 6 (six) hours as needed for moderate pain or severe pain. 60 tablet 0  . vitamin B-12 (CYANOCOBALAMIN) 500 MCG tablet Take 500 mcg by mouth daily.     Marland Kitchen lisinopril (PRINIVIL,ZESTRIL) 5 MG tablet Take 1 tablet (5 mg total) by mouth daily. (Patient not taking: Reported on 05/18/2019) 90 tablet 2  . oxyCODONE-acetaminophen (PERCOCET) 5-325 MG tablet Take 1 tablet by mouth every 4 (four) hours as needed for severe pain. (Patient not taking: Reported on 05/18/2019) 20 tablet 0   No current facility-administered medications for this visit.     Past Medical History:  Diagnosis Date  . Cholecystitis, chronic   . Cholelithiasis   . COPD (chronic obstructive pulmonary disease) ([place])   . Diabetes mellitus without complication ([place])   . Diabetic retinopathy ([place])   . Dyspnea   . Fatty liver 2008  . Fracture of clavicle 1992   left   . Fracture of ribs, multiple 1992   3 ribs  . GERD (gastroesophageal reflux disease)   . Hyperlipidemia   . Hypertension   . Paroxysmal SVT (supraventricular tachycardia) ([place])   . Pelvis  fracture ([place]) 1992  . Peripheral vascular disease ([place])   . Pleurisy   . Pleurisy   . Seasonal allergies     Past Surgical History:  Procedure Laterality Date  . AMPUTATION TOE Left 02/25/2017   Procedure: AMPUTATION TOE-LEFT 2ND MPJ;  Surgeon: Samara Deist, DPM;  Location: [facility] ORS;  Service: Podiatry;  Laterality: Left;  . AMPUTATION TOE Left 10/20/2018   Procedure: TOE MPJ T2 LEFT;  Surgeon: Samara Deist, DPM;  Location: [facility];  Service: Podiatry;  Laterality: Left;  . AMPUTATION TOE Left 01/12/2019   Procedure: AMPUTATION LEFT 5TH TOE AND JOINT;  Surgeon: Samara Deist, DPM;  Location: [facility];  Service: Podiatry;  Laterality: Left;  . BACK SURGERY  2008  . CHOLECYSTECTOMY    .  COLONOSCOPY WITH PROPOFOL N/A 08/15/2017   Procedure: COLONOSCOPY WITH PROPOFOL;  Surgeon: Lollie Sails, MD;  Location: [facility] ENDOSCOPY;  Service: Endoscopy;  Laterality: N/A;  . ENDARTERECTOMY Right 05/12/2017   Procedure: ENDARTERECTOMY CAROTID;  Surgeon: [name], MD;  Location: [facility] ORS;  Service: Vascular;  Laterality: Right;  . ENDARTERECTOMY FEMORAL Left 12/01/2018   Procedure: ENDARTERECTOMY FEMORAL;  Surgeon: [name], MD;  Location: [facility] OR[facility];  Service: Vascular;  Laterality: Left;  . ERCP    . FOOT SURGERY Right    x 3  . LOWER EXTREMITY ANGIOGRAM Left 12/01/2018   Procedure: LOWER EXTREMITY ANGIOGRAM;  Surgeon: [name], MD;  Location: [facility] ORS;  Service: Vascular;  Laterality: Left;  . LOWER EXTREMITY ANGIOGRAPHY Left 10/30/2018   Procedure: LOWER EXTREMITY ANGIOGRAPHY;  Surgeon: [name], MD;  Location: [facility];  Service: Cardiovascular;  Laterality: Left;  . LOWER EXTREMITY ANGIOGRAPHY Left 11/30/2018   Procedure: LOWER EXTREMITY ANGIOGRAPHY;  Surgeon: [name], MD;  Location: [facility];  Service: Cardiovascular;  Laterality: Left;    Social History        Tobacco Use  . Smoking status: Former Smoker    Last attempt to quit: 02/24/1997    Years since quitting: 20.5  . Smokeless tobacco: Never Used  Substance Use Topics  . Alcohol use: No  . Drug use: No     Family History      Family History  Problem Relation Age of Onset  . Diabetes Sister   no bleeding disorders, clotting disorders, or aneurysms        Allergies  Allergen Reactions  . Ampicillin   . Bactrim [Sulfamethoxazole-Trimethoprim] Other (See Comments)    Mouth sores Sores develop in mouth  . Amoxicillin Rash    Sores in my mouth  . Penicillins Rash    Rash in and around the mouth Has patient had a PCN reaction causing immediate rash, facial/tongue/throat swelling, SOB or lightheadedness with hypotension: Yes Has patient had  a PCN reaction causing severe rash involving mucus membranes or skin necrosis: Yes Has patient had a PCN reaction that required hospitalization: No Has patient had a PCN reaction occurring within the last 10 years: Yes If all of the above answers are "NO", then may proceed with Cephalosporin use.       REVIEW OF SYSTEMS(Negative unless checked)  Constitutional: [] ??Weight loss[] ??Fever[] ??Chills Cardiac:[] ??Chest pain[] ??Chest pressure[x] ??Palpitations [] ??Shortness of breath when laying flat [] ??Shortness of breath at rest [] ??Shortness of breath with exertion. Vascular: [] ??Pain in legs with walking[] ??Pain in legsat rest[] ??Pain in legs when laying flat [] ??Claudication [] ??Pain in feet when walking [] ??Pain in feet at rest [] ??Pain in feet when laying flat [] ??History of DVT [] ??  Phlebitis [] ??Swelling in legs [] ??Varicose veins [x] ??Non-healing ulcers Pulmonary: [] ??Uses home oxygen [] ??Productive cough[] ??Hemoptysis [] ??Wheeze [] ??COPD [] ??Asthma Neurologic: [] ??Dizziness [] ??Blackouts [] ??Seizures [] ??History of stroke [] ??History of TIA[] ??Aphasia [] ??Temporary blindness[] ??Dysphagia [] ??Weaknessor numbness in arms [x] ??Weakness or numbnessin legs Musculoskeletal: [x] ??Arthritis [] ??Joint swelling [] ??Joint pain [] ??Low back pain Hematologic:[] ??Easy bruising[] ??Easy bleeding [] ??Hypercoagulable state [] ??Anemic [] ??Hepatitis Gastrointestinal:[] ??Blood in stool[] ??Vomiting blood[] ??Gastroesophageal reflux/heartburn[] ??Difficulty swallowing. Genitourinary: [] ??Chronic kidney disease [] ??Difficulturination [] ??Frequenturination [] ??Burning with urination[] ??Blood in urine Skin: [] ??Rashes [x] ??Ulcers [x] ??Wounds Psychological: [] ??History of anxiety[] ??History of major depression.    Physical Examination  BP (!) 156/97 (BP Location: Left Arm, Patient  Position: Sitting, Cuff Size: Normal)   Pulse 73   Resp 12   Ht 5\' 10"  (1.778 m)   Wt 175 lb (79.4 kg)   BMI 25.11 kg/m  Gen:  WD/WN, NAD Head: [place]/AT, No temporalis wasting. Ear/Nose/Throat: Hearing grossly intact, nares w/o erythema or drainage Eyes: Conjunctiva clear. Sclera non-icteric Neck: Supple.  Trachea midline Pulmonary:  Good air movement, no use of accessory muscles.  Cardiac: RRR, no JVD Vascular:  Vessel Right Left  Radial Palpable Palpable                          PT Palpable 1+ Palpable  DP 1+ Palpable Palpable    Musculoskeletal: M/S 5/5 throughout.  No deformity or atrophy. Trace LLE edema. Neurologic: Sensation grossly intact in extremities.  Symmetrical.  Speech is fluent.  Psychiatric: Judgment intact, Mood & affect appropriate for pt's clinical situation. Dermatologic: No rashes or ulcers noted.  No cellulitis or open wounds.       Labs Recent Results (from the past 2160 hour(s))  POCT HgB A1C     Status: Abnormal   Collection Time: 04/19/19  8:41 AM  Result Value Ref Range   Hemoglobin A1C 7.6 (A) 4.0 - 5.6 %   HbA1c POC (<> result, manual entry)     HbA1c, POC (prediabetic range)     HbA1c, POC (controlled diabetic range)      Radiology No results found.  Assessment/Plan Hyperlipidemia lipid control important in reducing the progression of atherosclerotic disease. Continue statin therapy   Type 2 diabetes mellitus with complication (HCC) blood glucose control important in reducing the progression of atherosclerotic disease. Also, involved in wound healing. On appropriate medications.  Essential hypertension blood pressure control important in reducing the progression of atherosclerotic disease. On appropriate oral medications.  PAD (peripheral artery disease) ([place]) His studies today show no focal stenosis on duplex in the left lower extremity with ABIs of 0.84 on the right and 0.80 on the left with biphasic waveforms.  His digit  pressures are over 100 bilaterally.  Currently doing well after revascularization with no current limb threatening symptoms.  Continue current medical regimen.  Return to clinic in 6 months.    Leotis Pain, MD  05/18/2019 9:39 AM    This note was created with Dragon medical transcription system.  Any errors from dictation are purely unintentional

## 2019-05-18 NOTE — Assessment & Plan Note (Signed)
His studies today show no focal stenosis on duplex in the left lower extremity with ABIs of 0.84 on the right and 0.80 on the left with biphasic waveforms.  His digit pressures are over 100 bilaterally.  Currently doing well after revascularization with no current limb threatening symptoms.  Continue current medical regimen.  Return to clinic in 6 months.

## 2019-05-22 ENCOUNTER — Other Ambulatory Visit: Payer: Self-pay

## 2019-05-22 ENCOUNTER — Other Ambulatory Visit: Payer: Self-pay | Admitting: Adult Health

## 2019-05-22 DIAGNOSIS — M19011 Primary osteoarthritis, right shoulder: Secondary | ICD-10-CM

## 2019-05-22 DIAGNOSIS — M19012 Primary osteoarthritis, left shoulder: Secondary | ICD-10-CM

## 2019-05-22 MED ORDER — TRAMADOL HCL 50 MG PO TABS: 50.0000 mg | ORAL_TABLET | Freq: Four times a day (QID) | ORAL | 0 refills | Status: DC | PRN

## 2019-05-22 NOTE — Progress Notes (Signed)
Refilled tramadol.

## 2019-06-05 DIAGNOSIS — Z23 Encounter for immunization: Secondary | ICD-10-CM | POA: Diagnosis not present

## 2019-06-11 DIAGNOSIS — E1142 Type 2 diabetes mellitus with diabetic polyneuropathy: Secondary | ICD-10-CM | POA: Diagnosis not present

## 2019-06-11 DIAGNOSIS — I739 Peripheral vascular disease, unspecified: Secondary | ICD-10-CM | POA: Diagnosis not present

## 2019-06-11 DIAGNOSIS — B351 Tinea unguium: Secondary | ICD-10-CM | POA: Diagnosis not present

## 2019-06-15 ENCOUNTER — Ambulatory Visit (INDEPENDENT_AMBULATORY_CARE_PROVIDER_SITE_OTHER): Payer: Medicare Other | Admitting: Nurse Practitioner

## 2019-06-15 ENCOUNTER — Other Ambulatory Visit: Payer: Self-pay

## 2019-06-15 ENCOUNTER — Encounter: Payer: Self-pay | Admitting: Nurse Practitioner

## 2019-06-15 VITALS — BP 93/60 | HR 61 | Temp 97.4°F | Resp 16 | Ht 70.0 in | Wt 176.0 lb

## 2019-06-15 DIAGNOSIS — M19011 Primary osteoarthritis, right shoulder: Secondary | ICD-10-CM

## 2019-06-15 DIAGNOSIS — Z0001 Encounter for general adult medical examination with abnormal findings: Secondary | ICD-10-CM | POA: Diagnosis not present

## 2019-06-15 DIAGNOSIS — R300 Dysuria: Secondary | ICD-10-CM | POA: Diagnosis not present

## 2019-06-15 DIAGNOSIS — B370 Candidal stomatitis: Secondary | ICD-10-CM

## 2019-06-15 DIAGNOSIS — E1151 Type 2 diabetes mellitus with diabetic peripheral angiopathy without gangrene: Secondary | ICD-10-CM

## 2019-06-15 DIAGNOSIS — J0140 Acute pansinusitis, unspecified: Secondary | ICD-10-CM

## 2019-06-15 DIAGNOSIS — M19012 Primary osteoarthritis, left shoulder: Secondary | ICD-10-CM

## 2019-06-15 MED ORDER — NYSTATIN 100000 UNIT/ML MT SUSP: 5.0000 mL | Freq: Four times a day (QID) | OROMUCOSAL | 1 refills | Status: DC

## 2019-06-15 MED ORDER — ACCU-CHEK SOFT TOUCH LANCETS MISC: 12 refills | Status: DC

## 2019-06-15 MED ORDER — TRAMADOL HCL 50 MG PO TABS: 50.0000 mg | ORAL_TABLET | Freq: Two times a day (BID) | ORAL | 2 refills | Status: DC | PRN

## 2019-06-15 MED ORDER — AZITHROMYCIN 250 MG PO TABS: ORAL_TABLET | ORAL | 0 refills | Status: DC

## 2019-06-15 MED ORDER — ACCU-CHEK GUIDE VI STRP: ORAL_STRIP | 12 refills | Status: DC

## 2019-06-15 NOTE — Progress Notes (Signed)
[facility] [place], [place] [address]  Internal MEDICINE  Office Visit Note  Patient Name: [name]  [id number]  [id number]  Date of Service: 06/24/2019   Pt is here for routine health maintenance examination   Chief Complaint  Patient presents with  . Annual Exam  . Diabetes  . Hypertension  . Hyperlipidemia  . Gastroesophageal Reflux  . Quality Metric Gaps    diabetic foot exam  . Cough    pt would like something for his annual cough     The patient is here for health maintenance exam. His blood sugars are decently managed. His last check HgbA1c was 7.5 on 04/19/2019. He sees podiatry on regular basis as he has had to have three toes removed due to nonhealing diabetic ulcers. His last visit to podiatry was this past Monday. Feet are doing well. He states that he has cough with chest congestion. He states that he gets this every year. Generally needs to take antibiotic as well as something thi help with thrush which develops when he takes antibiotics. He does have history of COPD. Takes Dulera twice daily and does not use rescue inhaler. He does not smoke. States that his wife does smoke heavily.  Does take tramadol 50mg  tablets as needed to help with moderate to severe pain. He does have pain in the left shoulder. He also has bulging disk in the lumbar spine which is not operable. He has been taking this prescription for some time. Helps to relieve his pain and allows him to participate in usual activities and continue working. He has no negative side effects from taking this medication.     Current Medication: Outpatient Encounter Medications as of 06/15/2019  Medication Sig  . aspirin EC 81 MG tablet Take 81 mg by mouth daily.  . Calcium Carbonate-Vitamin D (CALCIUM PLUS VITAMIN D PO) Take 1 tablet by mouth daily.  . clopidogrel (PLAVIX) 75 MG tablet TAKE 1 TABLET BY MOUTH ONCE DAILY  . gabapentin (NEURONTIN) 300 MG capsule Take 1 capsule (300  mg total) by mouth 3 (three) times daily.  Marland Kitchen glyBURIDE (DIABETA) 2.5 MG tablet Take 1 tablet (2.5 mg total) by mouth 2 (two) times daily with a meal.  . lovastatin (MEVACOR) 10 MG tablet Take 1 tab  Po QHS  . metoprolol succinate (TOPROL-XL) 100 MG 24 hr tablet Take 1 tablet (100 mg total) by mouth daily. Take with or immediately following a meal. (Patient taking differently: Take 100 mg by mouth every morning. Take with or immediately following a meal.)  . mometasone-formoterol (DULERA) 100-5 MCG/ACT AERO Inhale 2 puffs into the lungs 2 (two) times daily as needed for wheezing.  Mable Fill (ALLERGY EYE OP) Place 1 drop into both eyes daily as needed (for allergies).  . Omega-3 Fatty Acids (FISH OIL PO) Take 1 capsule by mouth daily.  Marland Kitchen oxybutynin (DITROPAN) 5 MG tablet Take 1 tablet (5 mg total) by mouth 2 (two) times daily. (Patient taking differently: Take 5 mg by mouth at bedtime. )  . traMADol (ULTRAM) 50 MG tablet Take 1 tablet (50 mg total) by mouth every 12 (twelve) hours as needed for moderate pain or severe pain.  . vitamin B-12 (CYANOCOBALAMIN) 500 MCG tablet Take 500 mcg by mouth daily.   . [DISCONTINUED] traMADol (ULTRAM) 50 MG tablet Take 1 tablet (50 mg total) by mouth every 6 (six) hours as needed for moderate pain or severe pain.  Marland Kitchen azithromycin (ZITHROMAX) 250 MG tablet  z-pack - take as directed for 5 days  . glucose blood (ACCU-CHEK GUIDE) test strip Use as instructed  . Lancets (ACCU-CHEK SOFT TOUCH) lancets Use as instructed  . nystatin (MYCOSTATIN) 100000 UNIT/ML suspension Take 5 mLs (500,000 Units total) by mouth 4 (four) times daily.  . [DISCONTINUED] lisinopril (PRINIVIL,ZESTRIL) 5 MG tablet Take 1 tablet (5 mg total) by mouth daily. (Patient not taking: Reported on 05/18/2019)  . [DISCONTINUED] oxyCODONE-acetaminophen (PERCOCET) 5-325 MG tablet Take 1 tablet by mouth every 4 (four) hours as needed for severe pain. (Patient not taking: Reported on 05/18/2019)    No facility-administered encounter medications on file as of 06/15/2019.     Surgical History: Past Surgical History:  Procedure Laterality Date  . AMPUTATION TOE Left 02/25/2017   Procedure: AMPUTATION TOE-LEFT 2ND MPJ;  Surgeon: Samara Deist, DPM;  Location: [facility] ORS;  Service: Podiatry;  Laterality: Left;  . AMPUTATION TOE Left 10/20/2018   Procedure: TOE MPJ T2 LEFT;  Surgeon: Samara Deist, DPM;  Location: [facility] ORS;  Service: Podiatry;  Laterality: Left;  . AMPUTATION TOE Left 01/12/2019   Procedure: AMPUTATION LEFT 5TH TOE AND JOINT;  Surgeon: Samara Deist, DPM;  Location: [facility] ORS;  Service: Podiatry;  Laterality: Left;  . BACK SURGERY  2008  . CHOLECYSTECTOMY    . COLONOSCOPY WITH PROPOFOL N/A 08/15/2017   Procedure: COLONOSCOPY WITH PROPOFOL;  Surgeon: Lollie Sails, MD;  Location: [facility] ENDOSCOPY;  Service: Endoscopy;  Laterality: N/A;  . ENDARTERECTOMY Right 05/12/2017   Procedure: ENDARTERECTOMY CAROTID;  Surgeon: [name], MD;  Location: [facility] ORS;  Service: Vascular;  Laterality: Right;  . ENDARTERECTOMY FEMORAL Left 12/01/2018   Procedure: ENDARTERECTOMY FEMORAL;  Surgeon: [name], MD;  Location: [facility] ORS;  Service: Vascular;  Laterality: Left;  . ERCP    . FOOT SURGERY Right    x 3  . LOWER EXTREMITY ANGIOGRAM Left 12/01/2018   Procedure: LOWER EXTREMITY ANGIOGRAM;  Surgeon: [name], MD;  Location: [facility] ORS;  Service: Vascular;  Laterality: Left;  . LOWER EXTREMITY ANGIOGRAPHY Left 10/30/2018   Procedure: LOWER EXTREMITY ANGIOGRAPHY;  Surgeon: [name], MD;  Location: [facility];  Service: Cardiovascular;  Laterality: Left;  . LOWER EXTREMITY ANGIOGRAPHY Left 11/30/2018   Procedure: LOWER EXTREMITY ANGIOGRAPHY;  Surgeon: [name], MD;  Location: [place] [facility];  Service: Cardiovascular;  Laterality: Left;    Medical History: Past Medical History:  Diagnosis Date  . Cholecystitis, chronic   . Cholelithiasis   . COPD (chronic  obstructive pulmonary disease) ([place])   . Diabetes mellitus without complication ([place])   . Diabetic retinopathy ([place])   . Dyspnea   . Fatty liver 2008  . Fracture of clavicle 1992   left   . Fracture of ribs, multiple 1992   3 ribs  . GERD (gastroesophageal reflux disease)   . Hyperlipidemia   . Hypertension   . Paroxysmal SVT (supraventricular tachycardia) ([place])   . Pelvis fracture ([place]) 1992  . Peripheral vascular disease ([place])   . Pleurisy   . Pleurisy   . Seasonal allergies     Family History: Family History  Problem Relation Age of Onset  . Diabetes Sister       Review of Systems  Constitutional: Positive for fatigue. Negative for activity change, chills, fever and unexpected weight change.  HENT: Positive for congestion, postnasal drip, sinus pressure and sore throat. Negative for rhinorrhea and sneezing.   Respiratory: Positive for cough. Negative for chest tightness,  shortness of breath and wheezing.   Cardiovascular: Negative for chest pain and palpitations.  Gastrointestinal: Negative for abdominal distention, abdominal pain, anal bleeding, constipation, diarrhea, nausea and vomiting.  Endocrine: Negative for cold intolerance, heat intolerance and polyuria.       Blood sugars are a little elevated but stable overall. Most recent check HgbA1c done 03/2019 was 7.6.   Genitourinary: Positive for urgency. Negative for dysuria and frequency.       Nocturia. States that he is getting up three or four time per night to urinate.   Musculoskeletal: Positive for arthralgias, back pain and myalgias. Negative for joint swelling and neck pain.       Chronic, bilateral shoulder pain. Intermittent and made worse with exertion.   Skin: Negative for color change, pallor, rash and wound.  Allergic/Immunologic: Positive for environmental allergies.  Neurological: Negative for tremors, numbness and headaches.  Hematological: Negative for adenopathy. Does not bruise/bleed easily.   Psychiatric/Behavioral: Negative for behavioral problems (Depression), sleep disturbance and suicidal ideas. The patient is not nervous/anxious.      Today's Vitals   06/15/19 0856  BP: 93/60  Pulse: 61  Resp: 16  Temp: (!) 97.4 F (36.3 C)  SpO2: 96%  Weight: 176 lb (79.8 kg)  Height: 5\' 10"  (1.778 m)   Body mass index is 25.25 kg/m.  Physical Exam Vitals signs and nursing note reviewed.  Constitutional:      General: He is not in acute distress.    Appearance: Normal appearance. He is well-developed. He is not diaphoretic.  HENT:     Head: Normocephalic and atraumatic.     Right Ear: Tenderness present. Tympanic membrane is erythematous and bulging.     Left Ear: Tenderness present. Tympanic membrane is erythematous and bulging.     Nose: Congestion present.     Right Sinus: Frontal sinus tenderness present.     Left Sinus: Frontal sinus tenderness present.     Mouth/Throat:     Pharynx: Posterior oropharyngeal erythema present. No oropharyngeal exudate.  Eyes:     Pupils: Pupils are equal, round, and reactive to light.  Neck:     Musculoskeletal: Normal range of motion and neck supple.     Thyroid: No thyromegaly.     Vascular: No carotid bruit or JVD.     Trachea: No tracheal deviation.  Cardiovascular:     Rate and Rhythm: Normal rate and regular rhythm.     Pulses: Normal pulses.     Heart sounds: Normal heart sounds. No murmur. No friction rub. No gallop.   Pulmonary:     Effort: Pulmonary effort is normal. No respiratory distress.     Breath sounds: Wheezing and rhonchi present. No rales.  Chest:     Chest wall: No tenderness.  Abdominal:     General: Bowel sounds are normal.     Palpations: Abdomen is soft.     Tenderness: There is no abdominal tenderness.  Musculoskeletal:     Comments: Intermittent, bilateral shoulder pain, resulting in reduced ROM and strength in the shoulders. No bony abnormalities or deformities are currently noted.  Moderate  lower back pain also. Worse with bending and twisting at the waist. History of bulging disc in the lumbar spine. No visible or palpable abnormalities visible today  Left foot in post-operative shoe.   Lymphadenopathy:     Cervical: Cervical adenopathy present.  Skin:    General: Skin is warm and dry.  Neurological:     Mental Status: He is  alert and oriented to person, place, and time.     Cranial Nerves: No cranial nerve deficit.  Psychiatric:        Behavior: Behavior normal.        Thought Content: Thought content normal.        Judgment: Judgment normal.    Depression screen [facility] 2/9 06/15/2019 04/19/2019 02/22/2019 10/26/2018 06/06/2018  Decreased Interest 0 0 0 0 0  Down, Depressed, Hopeless 0 0 0 0 0  PHQ - 2 Score 0 0 0 0 0    Functional Status Survey: Is the patient deaf or have difficulty hearing?: No Does the patient have difficulty seeing, even when wearing glasses/contacts?: No Does the patient have difficulty concentrating, remembering, or making decisions?: No Does the patient have difficulty walking or climbing stairs?: No Does the patient have difficulty dressing or bathing?: No Does the patient have difficulty doing errands alone such as visiting a doctor's office or shopping?: No  MMSE - Mini Mental State Exam 06/15/2019  Orientation to time 5  Orientation to Place 5  Registration 3  Attention/ Calculation 5  Recall 3  Language- name 2 objects 2  Language- repeat 1  Language- follow 3 step command 3  Language- read & follow direction 1  Write a sentence 1  Copy design 1  Total score 30    Fall Risk  06/15/2019 04/19/2019 02/22/2019 10/26/2018 06/06/2018  Falls in the past year? 0 0 0 0 Yes  Number falls in past yr: - 0 0 0 1  Injury with Fall? - 0 - 0 -      LABS: Recent Results (from the past 2160 hour(s))  POCT HgB A1C     Status: Abnormal   Collection Time: 04/19/19  8:41 AM  Result Value Ref Range   Hemoglobin A1C 7.6 (A) 4.0 - 5.6 %   HbA1c POC  (<> result, manual entry)     HbA1c, POC (prediabetic range)     HbA1c, POC (controlled diabetic range)    UA/M w/rflx Culture, Routine     Status: Abnormal   Collection Time: 06/15/19  9:00 AM   Specimen: Urine   URINE  Result Value Ref Range   Specific Gravity, UA 1.016 1.005 - 1.030   pH, UA 5.0 5.0 - 7.5   Color, UA Yellow Yellow   Appearance Ur Clear Clear   Leukocytes,UA Negative Negative   Protein,UA Negative Negative/Trace   Glucose, UA 1+ (A) Negative   Ketones, UA Negative Negative   RBC, UA Negative Negative   Bilirubin, UA Negative Negative   Urobilinogen, Ur 0.2 0.2 - 1.0 mg/dL   Nitrite, UA Negative Negative   Microscopic Examination Comment     Comment: Microscopic follows if indicated.   Microscopic Examination See below:     Comment: Microscopic was indicated and was performed.   Urinalysis Reflex Comment     Comment: This specimen will not reflex to a Urine Culture.  Microscopic Examination     Status: None   Collection Time: 06/15/19  9:00 AM   URINE  Result Value Ref Range   WBC, UA 0-5 0 - 5 /hpf   RBC None seen 0 - 2 /hpf   Epithelial Cells (non renal) None seen 0 - 10 /hpf   Casts None seen None seen /lpf   Bacteria, UA None seen None seen/Few    Assessment/Plan: 1. Encounter for general adult medical examination with abnormal findings Annul health maintenance exam today.  2. Type 2 diabetes  mellitus with peripheral vascular disease ([place]) Blood sugars stable. Continue diabetic medication as prescribed. Monitor blood sugars closely. Limit intake of sweets and carbohydrates. Recheck HgbA1c at his next visit. Patient continues to see podiatry for diabetic foot care.  - Lancets (ACCU-CHEK SOFT TOUCH) lancets; Use as instructed  Dispense: 100 each; Refill: 12 - glucose blood (ACCU-CHEK GUIDE) test strip; Use as instructed  Dispense: 100 each; Refill: 12  3. Acute non-recurrent pansinusitis Start z-pack. Take as directed or 5 days. Rest and increase  fluids. Take OTC medications as needed and as indicated for acute symptoms.  - azithromycin (ZITHROMAX) 250 MG tablet; z-pack - take as directed for 5 days  Dispense: 6 tablet; Refill: 0  4. Primary osteoarthritis of shoulders, bilateral May continue to take tramadol 50mg  twice daily as needed for pain. A new prescription was sent to his pharmacy.  - traMADol (ULTRAM) 50 MG tablet; Take 1 tablet (50 mg total) by mouth every 12 (twelve) hours as needed for moderate pain or severe pain.  Dispense: 60 tablet; Refill: 2  5. Oral candidiasis Nystatin swish and swallow should be used four times daily to treat thrush caused by antibiotic therapy.  - nystatin (MYCOSTATIN) 100000 UNIT/ML suspension; Take 5 mLs (500,000 Units total) by mouth 4 (four) times daily.  Dispense: 200 mL; Refill: 1  6. Dysuria - UA/M w/rflx Culture, Routine  General Counseling: Kasin verbalizes understanding of the findings of todays visit and agrees with plan of treatment. I have discussed any further diagnostic evaluation that may be needed or ordered today. We also reviewed his medications today. he has been encouraged to call the office with any questions or concerns that should arise related to todays visit.    Counseling:  Diabetes Counseling:  1. Addition of ACE inh/ ARB'S for nephroprotection. Microalbumin is updated  2. Diabetic foot care, prevention of complications. Podiatry consult 3. Exercise and lose weight.  4. Diabetic eye examination, Diabetic eye exam is updated  5. Monitor blood sugar closlely. nutrition counseling.  6. Sign and symptoms of hypoglycemia including shaking sweating,confusion and headaches.  This patient was seen by Leretha Pol FNP Collaboration with Dr [name] as a part of collaborative care agreement  Orders Placed This Encounter  Procedures  . Microscopic Examination  . UA/M w/rflx Culture, Routine    Meds ordered this encounter  Medications  . traMADol (ULTRAM) 50 MG  tablet    Sig: Take 1 tablet (50 mg total) by mouth every 12 (twelve) hours as needed for moderate pain or severe pain.    Dispense:  60 tablet    Refill:  2    Order Specific Question:   Supervising Provider    Answer:   [name] [id number]  . azithromycin (ZITHROMAX) 250 MG tablet    Sig: z-pack - take as directed for 5 days    Dispense:  6 tablet    Refill:  0    Order Specific Question:   Supervising Provider    Answer:   [name] [place]  . nystatin (MYCOSTATIN) 100000 UNIT/ML suspension    Sig: Take 5 mLs (500,000 Units total) by mouth 4 (four) times daily.    Dispense:  200 mL    Refill:  1    Order Specific Question:   Supervising Provider    Answer:   [name] [id number]  . Lancets (ACCU-CHEK SOFT TOUCH) lancets    Sig: Use as instructed    Dispense:  100 each  Refill:  12    Order Specific Question:   Supervising Provider    Answer:   [name] [id number]  . glucose blood (ACCU-CHEK GUIDE) test strip    Sig: Use as instructed    Dispense:  100 each    Refill:  12    Order Specific Question:   Supervising Provider    Answer:   [name] [[id number]]    Time spent: [place], MD  Internal Medicine

## 2019-06-16 LAB — UA/M W/RFLX CULTURE, ROUTINE
Bilirubin, UA: NEGATIVE
Ketones, UA: NEGATIVE
Leukocytes,UA: NEGATIVE
Nitrite, UA: NEGATIVE
Protein,UA: NEGATIVE
RBC, UA: NEGATIVE
Specific Gravity, UA: 1.016 (ref 1.005–1.030)
Urobilinogen, Ur: 0.2 mg/dL (ref 0.2–1.0)
pH, UA: 5 (ref 5.0–7.5)

## 2019-06-16 LAB — MICROSCOPIC EXAMINATION
Bacteria, UA: NONE SEEN
Casts: NONE SEEN /lpf
Epithelial Cells (non renal): NONE SEEN /hpf (ref 0–10)
RBC, Urine: NONE SEEN /hpf (ref 0–2)

## 2019-06-25 ENCOUNTER — Other Ambulatory Visit: Payer: Self-pay

## 2019-06-25 DIAGNOSIS — E1165 Type 2 diabetes mellitus with hyperglycemia: Secondary | ICD-10-CM

## 2019-06-25 MED ORDER — GLYBURIDE 2.5 MG PO TABS: 2.5000 mg | ORAL_TABLET | Freq: Two times a day (BID) | ORAL | 2 refills | Status: DC

## 2019-06-29 ENCOUNTER — Ambulatory Visit (INDEPENDENT_AMBULATORY_CARE_PROVIDER_SITE_OTHER): Payer: Medicare Other | Admitting: Vascular Surgery

## 2019-06-29 ENCOUNTER — Other Ambulatory Visit: Payer: Self-pay

## 2019-06-29 ENCOUNTER — Encounter (INDEPENDENT_AMBULATORY_CARE_PROVIDER_SITE_OTHER): Payer: Self-pay | Admitting: Vascular Surgery

## 2019-06-29 ENCOUNTER — Ambulatory Visit (INDEPENDENT_AMBULATORY_CARE_PROVIDER_SITE_OTHER): Payer: Medicare Other

## 2019-06-29 ENCOUNTER — Encounter (INDEPENDENT_AMBULATORY_CARE_PROVIDER_SITE_OTHER): Payer: 59

## 2019-06-29 VITALS — BP 132/73 | HR 68 | Resp 17 | Ht 70.0 in | Wt 178.0 lb

## 2019-06-29 DIAGNOSIS — I739 Peripheral vascular disease, unspecified: Secondary | ICD-10-CM

## 2019-06-29 DIAGNOSIS — I10 Essential (primary) hypertension: Secondary | ICD-10-CM

## 2019-06-29 DIAGNOSIS — I6523 Occlusion and stenosis of bilateral carotid arteries: Secondary | ICD-10-CM | POA: Diagnosis not present

## 2019-06-29 DIAGNOSIS — E1151 Type 2 diabetes mellitus with diabetic peripheral angiopathy without gangrene: Secondary | ICD-10-CM | POA: Diagnosis not present

## 2019-06-29 DIAGNOSIS — E782 Mixed hyperlipidemia: Secondary | ICD-10-CM | POA: Diagnosis not present

## 2019-06-29 DIAGNOSIS — B370 Candidal stomatitis: Secondary | ICD-10-CM

## 2019-06-29 MED ORDER — NYSTATIN 100000 UNIT/ML MT SUSP: 5.0000 mL | Freq: Four times a day (QID) | OROMUCOSAL | 1 refills | Status: DC

## 2019-06-29 NOTE — Assessment & Plan Note (Signed)
Recently checked with good blood flow

## 2019-06-29 NOTE — Progress Notes (Signed)
MRN : [id number]  [name] is a 68 y.o. (1950-11-08) male who presents with chief complaint of  Chief Complaint  Patient presents with  . Follow-up  .  History of Present Illness: Patient returns today in follow up of his carotid and subclavian disease.  He has no focal neurologic symptoms.  His carotid duplex today reveals 1 to 39% bilateral ICA stenosis.  His left vertebral artery is retrograde and his left brachial pressure is markedly reduced consistent with a left subclavian steal syndrome. He is also doing well from his lower extremity PAD status post previous interventions.  He says he can now feel his legs and he does not have a lot of pain with walking.  He does have some neuropathic pain.  Current Outpatient Medications  Medication Sig Dispense Refill  . aspirin EC 81 MG tablet Take 81 mg by mouth daily.    . Calcium Carbonate-Vitamin D (CALCIUM PLUS VITAMIN D PO) Take 1 tablet by mouth daily.    . clopidogrel (PLAVIX) 75 MG tablet TAKE 1 TABLET BY MOUTH ONCE DAILY 90 tablet 3  . gabapentin (NEURONTIN) 300 MG capsule Take 1 capsule (300 mg total) by mouth 3 (three) times daily. 90 capsule 3  . glucose blood (ACCU-CHEK GUIDE) test strip Use as instructed 100 each 12  . glyBURIDE (DIABETA) 2.5 MG tablet Take 1 tablet (2.5 mg total) by mouth 2 (two) times daily with a meal. 180 tablet 2  . Lancets (ACCU-CHEK SOFT TOUCH) lancets Use as instructed 100 each 12  . metoprolol succinate (TOPROL-XL) 100 MG 24 hr tablet Take 1 tablet (100 mg total) by mouth daily. Take with or immediately following a meal. (Patient taking differently: Take 100 mg by mouth every morning. Take with or immediately following a meal.) 30 tablet 0  . mometasone-formoterol (DULERA) 100-5 MCG/ACT AERO Inhale 2 puffs into the lungs 2 (two) times daily as needed for wheezing.    Mable Fill (ALLERGY EYE OP) Place 1 drop into both eyes daily as needed (for allergies).    . Omega-3 Fatty Acids (FISH  OIL PO) Take 1 capsule by mouth daily.    . traMADol (ULTRAM) 50 MG tablet Take 1 tablet (50 mg total) by mouth every 12 (twelve) hours as needed for moderate pain or severe pain. 60 tablet 2  . vitamin B-12 (CYANOCOBALAMIN) 500 MCG tablet Take 500 mcg by mouth daily.     Marland Kitchen azithromycin (ZITHROMAX) 250 MG tablet z-pack - take as directed for 5 days (Patient not taking: Reported on 06/29/2019) 6 tablet 0  . lovastatin (MEVACOR) 10 MG tablet Take 1 tab  Po QHS (Patient not taking: Reported on 06/29/2019) 90 tablet 0  . nystatin (MYCOSTATIN) 100000 UNIT/ML suspension Take 5 mLs (500,000 Units total) by mouth 4 (four) times daily. 200 mL 1  . oxybutynin (DITROPAN) 5 MG tablet Take 1 tablet (5 mg total) by mouth 2 (two) times daily. (Patient not taking: Reported on 06/29/2019) 60 tablet 3   No current facility-administered medications for this visit.     Past Medical History:  Diagnosis Date  . Cholecystitis, chronic   . Cholelithiasis   . COPD (chronic obstructive pulmonary disease) ([place])   . Diabetes mellitus without complication ([place])   . Diabetic retinopathy ([place])   . Dyspnea   . Fatty liver 2008  . Fracture of clavicle 1992   left   . Fracture of ribs, multiple 1992   3 ribs  . GERD (gastroesophageal reflux disease)   .  Hyperlipidemia   . Hypertension   . Paroxysmal SVT (supraventricular tachycardia) ([place])   . Pelvis fracture ([place]) 1992  . Peripheral vascular disease ([place])   . Pleurisy   . Pleurisy   . Seasonal allergies     Past Surgical History:  Procedure Laterality Date  . AMPUTATION TOE Left 02/25/2017   Procedure: AMPUTATION TOE-LEFT 2ND MPJ;  Surgeon: Samara Deist, DPM;  Location: [facility] ORS;  Service: Podiatry;  Laterality: Left;  . AMPUTATION TOE Left 10/20/2018   Procedure: TOE MPJ T2 LEFT;  Surgeon: Samara Deist, DPM;  Location: [facility] ORS;  Service: Podiatry;  Laterality: Left;  . AMPUTATION TOE Left 01/12/2019   Procedure: AMPUTATION LEFT 5TH TOE AND JOINT;  Surgeon:  Samara Deist, DPM;  Location: [facility] ORS;  Service: Podiatry;  Laterality: Left;  . BACK SURGERY  2008  . CHOLECYSTECTOMY    . COLONOSCOPY WITH PROPOFOL N/A 08/15/2017   Procedure: COLONOSCOPY WITH PROPOFOL;  Surgeon: Lollie Sails, MD;  Location: [facility] ENDOSCOPY;  Service: Endoscopy;  Laterality: N/A;  . ENDARTERECTOMY Right 05/12/2017   Procedure: ENDARTERECTOMY CAROTID;  Surgeon: [name], MD;  Location: [facility] ORS;  Service: Vascular;  Laterality: Right;  . ENDARTERECTOMY FEMORAL Left 12/01/2018   Procedure: ENDARTERECTOMY FEMORAL;  Surgeon: [name], MD;  Location: [facility] ORS;  Service: Vascular;  Laterality: Left;  . ERCP    . FOOT SURGERY Right    x 3  . LOWER EXTREMITY ANGIOGRAM Left 12/01/2018   Procedure: LOWER EXTREMITY ANGIOGRAM;  Surgeon: [name], MD;  Location: [facility] ORS;  Service: Vascular;  Laterality: Left;  . LOWER EXTREMITY ANGIOGRAPHY Left 10/30/2018   Procedure: LOWER EXTREMITY ANGIOGRAPHY;  Surgeon: [name], MD;  Location: [place] [facility] LAB;  Service: Cardiovascular;  Laterality: Left;  . LOWER EXTREMITY ANGIOGRAPHY Left 11/30/2018   Procedure: LOWER EXTREMITY ANGIOGRAPHY;  Surgeon: [name], MD;  Location: [place] [facility];  Service: Cardiovascular;  Laterality: Left;   Social History        Tobacco Use  . Smoking status: Former Smoker    Last attempt to quit: 02/24/1997    Years since quitting: 20.5  . Smokeless tobacco: Never Used  Substance Use Topics  . Alcohol use: No  . Drug use: No     Family History      Family History  Problem Relation Age of Onset  . Diabetes Sister   no bleeding disorders, clotting disorders, or aneurysms        Allergies  Allergen Reactions  . Ampicillin   . Bactrim [Sulfamethoxazole-Trimethoprim] Other (See Comments)    Mouth sores Sores develop in mouth  . Amoxicillin Rash    Sores in my mouth  . Penicillins Rash    Rash in and around the mouth Has patient had a  PCN reaction causing immediate rash, facial/tongue/throat swelling, SOB or lightheadedness with hypotension: Yes Has patient had a PCN reaction causing severe rash involving mucus membranes or skin necrosis: Yes Has patient had a PCN reaction that required hospitalization: No Has patient had a PCN reaction occurring within the last 10 years: Yes If all of the above answers are "NO", then may proceed with Cephalosporin use.       REVIEW OF SYSTEMS(Negative unless checked)  Constitutional: [] ???Weight loss[] ???Fever[] ???Chills Cardiac:[] ???Chest pain[] ???Chest pressure[x] ???Palpitations [] ???Shortness of breath when laying flat [] ???Shortness of breath at rest [] ???Shortness of breath with exertion. Vascular: [] ???Pain in legs with walking[] ???Pain in legsat rest[] ???Pain in legs when laying flat [] ???Claudication [] ???Pain in feet  when walking [] ???Pain in feet at rest [] ???Pain in feet when laying flat [] ???History of DVT [] ???Phlebitis [] ???Swelling in legs [] ???Varicose veins [x] ???Non-healing ulcers Pulmonary: [] ???Uses home oxygen [] ???Productive cough[] ???Hemoptysis [] ???Wheeze [] ???COPD [] ???Asthma Neurologic: [] ???Dizziness [] ???Blackouts [] ???Seizures [] ???History of stroke [] ???History of TIA[] ???Aphasia [] ???Temporary blindness[] ???Dysphagia [] ???Weaknessor numbness in arms [x] ???Weakness or numbnessin legs Musculoskeletal: [x] ???Arthritis [] ???Joint swelling [] ???Joint pain [] ???Low back pain Hematologic:[] ???Easy bruising[] ???Easy bleeding [] ???Hypercoagulable state [] ???Anemic [] ???Hepatitis Gastrointestinal:[] ???Blood in stool[] ???Vomiting blood[] ???Gastroesophageal reflux/heartburn[] ???Difficulty swallowing. Genitourinary: [] ???Chronic kidney disease [] ???Difficulturination [] ???Frequenturination [] ???Burning with urination[] ???Blood in urine Skin: [] ???Rashes  [x] ???Ulcers [x] ???Wounds Psychological: [] ???History of anxiety[] ???History of major depression.     Physical Examination  BP 132/73 (BP Location: Right Arm)   Pulse 68   Resp 17   Ht 5\' 10"  (1.778 m)   Wt 178 lb (80.7 kg)   BMI 25.54 kg/m  Gen:  WD/WN, NAD Head: [place] /AT, No temporalis wasting. Ear/Nose/Throat: Hearing grossly intact, nares w/o erythema or drainage Eyes: Conjunctiva clear. Sclera non-icteric Neck: Supple.  Trachea midline Pulmonary:  Good air movement, no use of accessory muscles.  Cardiac: RRR, no JVD Vascular:  Vessel Right Left  Radial Palpable  trace palpable                          PT  1+ palpable  1+ palpable  DP Palpable Palpable    Musculoskeletal: M/S 5/5 throughout.  No deformity or atrophy.  No significant lower extremity edema. Neurologic: Sensation grossly intact in extremities.  Symmetrical.  Speech is fluent.  Psychiatric: Judgment intact, Mood & affect appropriate for pt's clinical situation. Dermatologic: No rashes or ulcers noted.  No cellulitis or open wounds.       Labs Recent Results (from the past 2160 hour(s))  POCT HgB A1C     Status: Abnormal   Collection Time: 04/19/19  8:41 AM  Result Value Ref Range   Hemoglobin A1C 7.6 (A) 4.0 - 5.6 %   HbA1c POC (<> result, manual entry)     HbA1c, POC (prediabetic range)     HbA1c, POC (controlled diabetic range)    UA/M w/rflx Culture, Routine     Status: Abnormal   Collection Time: 06/15/19  9:00 AM   Specimen: Urine   URINE  Result Value Ref Range   Specific Gravity, UA 1.016 1.005 - 1.030   pH, UA 5.0 5.0 - 7.5   Color, UA Yellow Yellow   Appearance Ur Clear Clear   Leukocytes,UA Negative Negative   Protein,UA Negative Negative/Trace   Glucose, UA 1+ (A) Negative   Ketones, UA Negative Negative   RBC, UA Negative Negative   Bilirubin, UA Negative Negative   Urobilinogen, Ur 0.2 0.2 - 1.0 mg/dL   Nitrite, UA Negative Negative   Microscopic Examination  Comment     Comment: Microscopic follows if indicated.   Microscopic Examination See below:     Comment: Microscopic was indicated and was performed.   Urinalysis Reflex Comment     Comment: This specimen will not reflex to a Urine Culture.  Microscopic Examination     Status: None   Collection Time: 06/15/19  9:00 AM   URINE  Result Value Ref Range   WBC, UA 0-5 0 - 5 /hpf   RBC None seen 0 - 2 /hpf   Epithelial Cells (non renal) None seen 0 - 10 /hpf   Casts None seen None seen /lpf   Bacteria, UA None seen None seen/Few    Radiology No results found.  Assessment/Plan Hyperlipidemia lipid  control important in reducing the progression of atherosclerotic disease. Continue statin therapy   Type 2 diabetes mellitus with complication (HCC) blood glucose control important in reducing the progression of atherosclerotic disease. Also, involved in wound healing. On appropriate medications.  Essential hypertension blood pressure control important in reducing the progression of atherosclerotic disease. On appropriate oral medications.  PAD (peripheral artery disease) (HCC) Recently checked with good blood flow  Bilateral carotid artery stenosis Bilateral carotid arteries have 1 to 39% ICA stenosis.  Left vertebral artery is retrograde and his left brachial pressure is markedly reduced consistent with subclavian steal syndrome.  This is not symptomatic for the patient so there is no role for intervention at current.  I would recommend yearly follow-ups with noninvasive studies unless he develops significant symptoms.    Leotis Pain, MD  06/29/2019 12:35 PM    This note was created with Dragon medical transcription system.  Any errors from dictation are purely unintentional

## 2019-06-29 NOTE — Assessment & Plan Note (Signed)
Bilateral carotid arteries have 1 to 39% ICA stenosis.  Left vertebral artery is retrograde and his left brachial pressure is markedly reduced consistent with subclavian steal syndrome.  This is not symptomatic for the patient so there is no role for intervention at current.  I would recommend yearly follow-ups with noninvasive studies unless he develops significant symptoms.

## 2019-07-09 ENCOUNTER — Other Ambulatory Visit: Payer: Self-pay

## 2019-07-11 ENCOUNTER — Telehealth: Payer: Self-pay

## 2019-07-11 ENCOUNTER — Other Ambulatory Visit: Payer: Self-pay

## 2019-07-11 MED ORDER — LOVASTATIN 10 MG PO TABS: ORAL_TABLET | ORAL | 1 refills | Status: DC

## 2019-07-11 NOTE — Telephone Encounter (Signed)
LMOM PT HE IS TAKING LOVASTATIN OR NOT IN OUR CHART SHOWING PT NOT TAKING

## 2019-08-21 ENCOUNTER — Other Ambulatory Visit (INDEPENDENT_AMBULATORY_CARE_PROVIDER_SITE_OTHER): Payer: Self-pay | Admitting: Vascular Surgery

## 2019-08-28 ENCOUNTER — Other Ambulatory Visit: Payer: Self-pay

## 2019-08-28 DIAGNOSIS — M19011 Primary osteoarthritis, right shoulder: Secondary | ICD-10-CM

## 2019-08-30 MED ORDER — TRAMADOL HCL 50 MG PO TABS: 50.0000 mg | ORAL_TABLET | Freq: Two times a day (BID) | ORAL | 0 refills | Status: DC | PRN

## 2019-09-05 ENCOUNTER — Other Ambulatory Visit: Payer: Self-pay

## 2019-09-05 MED ORDER — GABAPENTIN 300 MG PO CAPS: 300.0000 mg | ORAL_CAPSULE | Freq: Three times a day (TID) | ORAL | 3 refills | Status: DC

## 2019-09-06 DIAGNOSIS — E113313 Type 2 diabetes mellitus with moderate nonproliferative diabetic retinopathy with macular edema, bilateral: Secondary | ICD-10-CM | POA: Diagnosis not present

## 2019-09-12 ENCOUNTER — Telehealth: Payer: Self-pay

## 2019-09-12 NOTE — Telephone Encounter (Signed)
Confirmed appointment with patient. klh °

## 2019-09-13 ENCOUNTER — Telehealth: Payer: Self-pay

## 2019-09-13 NOTE — Telephone Encounter (Signed)
CONFIRMED AND SCREENED FOR 09-17-19 OV. 

## 2019-09-17 ENCOUNTER — Encounter: Payer: Self-pay | Admitting: Nurse Practitioner

## 2019-09-17 ENCOUNTER — Ambulatory Visit (INDEPENDENT_AMBULATORY_CARE_PROVIDER_SITE_OTHER): Payer: Medicare Other | Admitting: Nurse Practitioner

## 2019-09-17 ENCOUNTER — Other Ambulatory Visit: Payer: Self-pay

## 2019-09-17 VITALS — BP 134/78 | HR 75 | Resp 16 | Ht 70.0 in | Wt 178.8 lb

## 2019-09-17 DIAGNOSIS — I10 Essential (primary) hypertension: Secondary | ICD-10-CM | POA: Diagnosis not present

## 2019-09-17 DIAGNOSIS — E1165 Type 2 diabetes mellitus with hyperglycemia: Secondary | ICD-10-CM | POA: Diagnosis not present

## 2019-09-17 DIAGNOSIS — K219 Gastro-esophageal reflux disease without esophagitis: Secondary | ICD-10-CM | POA: Diagnosis not present

## 2019-09-17 LAB — POCT GLYCOSYLATED HEMOGLOBIN (HGB A1C): Hemoglobin A1C: 8.1 % — AB (ref 4.0–5.6)

## 2019-09-17 MED ORDER — GLYBURIDE 5 MG PO TABS: 5.0000 mg | ORAL_TABLET | Freq: Two times a day (BID) | ORAL | 3 refills | Status: DC

## 2019-09-17 NOTE — Progress Notes (Signed)
[facility] [place], [place] [address]  Internal MEDICINE  Office Visit Note  Patient Name: [name]  [id number]  [id number]  Date of Service: 09/17/2019  Chief Complaint  Patient presents with  . Diabetes    The patient is here for routine follow up visit. His blood sugars are elevated today. Gradually have been going up over the past year. Last year at this time, his HgbA1c was 6.7, today, it is 8.1. he states that he feels well. Blood pressure is well managed.      Current Medication: Outpatient Encounter Medications as of 09/17/2019  Medication Sig  . aspirin EC 81 MG tablet Take 81 mg by mouth daily.  . Calcium Carbonate-Vitamin D (CALCIUM PLUS VITAMIN D PO) Take 1 tablet by mouth daily.  . clopidogrel (PLAVIX) 75 MG tablet TAKE 1 TABLET BY MOUTH ONCE DAILY  . gabapentin (NEURONTIN) 300 MG capsule Take 1 capsule (300 mg total) by mouth 3 (three) times daily.  Marland Kitchen glucose blood (ACCU-CHEK GUIDE) test strip Use as instructed  . glyBURIDE (DIABETA) 5 MG tablet Take 1 tablet (5 mg total) by mouth 2 (two) times daily with a meal.  . Lancets (ACCU-CHEK SOFT TOUCH) lancets Use as instructed  . lovastatin (MEVACOR) 10 MG tablet Take 1 tab  Po QHS  . metoprolol succinate (TOPROL-XL) 100 MG 24 hr tablet Take 1 tablet (100 mg total) by mouth daily. Take with or immediately following a meal.  . mometasone-formoterol (DULERA) 100-5 MCG/ACT AERO Inhale 2 puffs into the lungs 2 (two) times daily as needed for wheezing.  Mable Fill (ALLERGY EYE OP) Place 1 drop into both eyes daily as needed (for allergies).  . nystatin (MYCOSTATIN) 100000 UNIT/ML suspension Take 5 mLs (500,000 Units total) by mouth 4 (four) times daily.  . Omega-3 Fatty Acids (FISH OIL PO) Take 1 capsule by mouth daily.  Marland Kitchen oxybutynin (DITROPAN) 5 MG tablet Take 1 tablet (5 mg total) by mouth 2 (two) times daily.  . traMADol (ULTRAM) 50 MG tablet Take 1 tablet (50 mg total) by  mouth every 12 (twelve) hours as needed for moderate pain or severe pain.  . vitamin B-12 (CYANOCOBALAMIN) 500 MCG tablet Take 500 mcg by mouth daily.   . [DISCONTINUED] glyBURIDE (DIABETA) 2.5 MG tablet Take 1 tablet (2.5 mg total) by mouth 2 (two) times daily with a meal.   No facility-administered encounter medications on file as of 09/17/2019.    Surgical History: Past Surgical History:  Procedure Laterality Date  . AMPUTATION TOE Left 02/25/2017   Procedure: AMPUTATION TOE-LEFT 2ND MPJ;  Surgeon: Samara Deist, DPM;  Location: [facility] ORS;  Service: Podiatry;  Laterality: Left;  . AMPUTATION TOE Left 10/20/2018   Procedure: TOE MPJ T2 LEFT;  Surgeon: Samara Deist, DPM;  Location: [facility] ORS;  Service: Podiatry;  Laterality: Left;  . AMPUTATION TOE Left 01/12/2019   Procedure: AMPUTATION LEFT 5TH TOE AND JOINT;  Surgeon: Samara Deist, DPM;  Location: [facility] ORS;  Service: Podiatry;  Laterality: Left;  . BACK SURGERY  2008  . CHOLECYSTECTOMY    . COLONOSCOPY WITH PROPOFOL N/A 08/15/2017   Procedure: COLONOSCOPY WITH PROPOFOL;  Surgeon: Lollie Sails, MD;  Location: [facility] ENDOSCOPY;  Service: Endoscopy;  Laterality: N/A;  . ENDARTERECTOMY Right 05/12/2017   Procedure: ENDARTERECTOMY CAROTID;  Surgeon: [name], MD;  Location: [facility] ORS;  Service: Vascular;  Laterality: Right;  . ENDARTERECTOMY FEMORAL Left 12/01/2018   Procedure: ENDARTERECTOMY FEMORAL;  Surgeon: [name],  MD;  Location: [facility] ORS;  Service: Vascular;  Laterality: Left;  . ERCP    . FOOT SURGERY Right    x 3  . LOWER EXTREMITY ANGIOGRAM Left 12/01/2018   Procedure: LOWER EXTREMITY ANGIOGRAM;  Surgeon: [name], MD;  Location: [facility] ORS;  Service: Vascular;  Laterality: Left;  . LOWER EXTREMITY ANGIOGRAPHY Left 10/30/2018   Procedure: LOWER EXTREMITY ANGIOGRAPHY;  Surgeon: [name], MD;  Location: [place] [facility] LAB;  Service: Cardiovascular;  Laterality: Left;  . LOWER EXTREMITY ANGIOGRAPHY Left 11/30/2018    Procedure: LOWER EXTREMITY ANGIOGRAPHY;  Surgeon: [name], MD;  Location: [place] [facility] LAB;  Service: Cardiovascular;  Laterality: Left;    Medical History: Past Medical History:  Diagnosis Date  . Cholecystitis, chronic   . Cholelithiasis   . COPD (chronic obstructive pulmonary disease) ([place])   . Diabetes mellitus without complication ([place])   . Diabetic retinopathy ([place])   . Dyspnea   . Fatty liver 2008  . Fracture of clavicle 1992   left   . Fracture of ribs, multiple 1992   3 ribs  . GERD (gastroesophageal reflux disease)   . Hyperlipidemia   . Hypertension   . Paroxysmal SVT (supraventricular tachycardia) ([place])   . Pelvis fracture ([place]) 1992  . Peripheral vascular disease ([place])   . Pleurisy   . Pleurisy   . Seasonal allergies     Family History: Family History  Problem Relation Age of Onset  . Diabetes Sister     Social History   Socioeconomic History  . Marital status: Single    Spouse name: Not on file  . Number of children: Not on file  . Years of education: Not on file  . Highest education level: Not on file  Occupational History  . Not on file  Tobacco Use  . Smoking status: Former Smoker    Quit date: 02/24/1997    Years since quitting: 22.5  . Smokeless tobacco: Never Used  Substance and Sexual Activity  . Alcohol use: No  . Drug use: No  . Sexual activity: Not on file  Other Topics Concern  . Not on file  Social History Narrative  . Not on file   Social Determinants of Health   Financial Resource Strain:   . Difficulty of Paying Living Expenses: Not on file  Food Insecurity:   . Worried About Charity fundraiser in the Last Year: Not on file  . Ran Out of Food in the Last Year: Not on file  Transportation Needs:   . Lack of Transportation (Medical): Not on file  . Lack of Transportation (Non-Medical): Not on file  Physical Activity:   . Days of Exercise per Week: Not on file  . Minutes of Exercise per Session: Not on file   Stress:   . Feeling of Stress : Not on file  Social Connections:   . Frequency of Communication with Friends and Family: Not on file  . Frequency of Social Gatherings with Friends and Family: Not on file  . Attends Religious Services: Not on file  . Active Member of Clubs or Organizations: Not on file  . Attends Archivist Meetings: Not on file  . Marital Status: Not on file  Intimate Partner Violence:   . Fear of Current or Ex-Partner: Not on file  . Emotionally Abused: Not on file  . Physically Abused: Not on file  . Sexually Abused: Not on file      Review  of Systems  Constitutional: Negative for activity change, chills, fatigue, fever and unexpected weight change.  HENT: Negative for congestion, postnasal drip, rhinorrhea, sinus pressure, sneezing and sore throat.   Respiratory: Positive for cough and wheezing. Negative for chest tightness and shortness of breath.        Intermittent and generally well controlled.   Cardiovascular: Negative for chest pain and palpitations.  Gastrointestinal: Negative for abdominal distention, abdominal pain, anal bleeding, constipation, diarrhea, nausea and vomiting.  Endocrine: Negative for cold intolerance, heat intolerance and polyuria.       Blood sugars gradually increasing over the past year. HgbA1c 8.1 today.   Genitourinary: Negative for urgency.  Musculoskeletal: Positive for arthralgias, back pain and myalgias. Negative for joint swelling and neck pain.       Chronic, bilateral shoulder pain. Intermittent and made worse with exertion.   Skin: Negative for color change, pallor, rash and wound.  Allergic/Immunologic: Positive for environmental allergies.  Neurological: Negative for tremors, numbness and headaches.  Hematological: Negative for adenopathy. Does not bruise/bleed easily.  Psychiatric/Behavioral: Negative for behavioral problems (Depression), sleep disturbance and suicidal ideas. The patient is not  nervous/anxious.     Today's Vitals   09/17/19 0823  BP: 134/78  Pulse: 75  Resp: 16  SpO2: 93%  Weight: 178 lb 12.8 oz (81.1 kg)  Height: 5\' 10"  (1.778 m)   Body mass index is 25.66 kg/m.  Physical Exam Vitals and nursing note reviewed.  Constitutional:      General: He is not in acute distress.    Appearance: Normal appearance. He is well-developed. He is not diaphoretic.  HENT:     Head: Normocephalic and atraumatic.     Mouth/Throat:     Pharynx: No oropharyngeal exudate.  Eyes:     Pupils: Pupils are equal, round, and reactive to light.  Neck:     Thyroid: No thyromegaly.     Vascular: No JVD.     Trachea: No tracheal deviation.  Cardiovascular:     Rate and Rhythm: Normal rate and regular rhythm.     Heart sounds: Normal heart sounds. No murmur. No friction rub. No gallop.   Pulmonary:     Effort: Pulmonary effort is normal. No respiratory distress.     Breath sounds: Normal breath sounds. No wheezing or rales.  Chest:     Chest wall: No tenderness.  Abdominal:     Palpations: Abdomen is soft.  Musculoskeletal:        General: Normal range of motion.     Cervical back: Normal range of motion and neck supple.  Lymphadenopathy:     Cervical: No cervical adenopathy.  Skin:    General: Skin is warm and dry.  Neurological:     Mental Status: He is alert and oriented to person, place, and time.     Cranial Nerves: No cranial nerve deficit.  Psychiatric:        Behavior: Behavior normal.        Thought Content: Thought content normal.        Judgment: Judgment normal.    Assessment/Plan: 1. Uncontrolled type 2 diabetes mellitus with hyperglycemia (HCC) - POCT HgB A1C 8.1 today. Increased glyburide to 5mg  twice daily. Reviewed importance if limiting intake of carbohydrates and augar in the diet and increasing physical activity. Monitor blood sugars closely.  - glyBURIDE (DIABETA) 5 MG tablet; Take 1 tablet (5 mg total) by mouth 2 (two) times daily with a meal.   Dispense: 60 tablet; Refill:  3  2. Essential hypertension Stable.   3. Gastroesophageal reflux disease without esophagitis Stable.   General Counseling: Liberato verbalizes understanding of the findings of todays visit and agrees with plan of treatment. I have discussed any further diagnostic evaluation that may be needed or ordered today. We also reviewed his medications today. he has been encouraged to call the office with any questions or concerns that should arise related to todays visit.  Diabetes Counseling:  1. Addition of ACE inh/ ARB'S for nephroprotection. Microalbumin is updated  2. Diabetic foot care, prevention of complications. Podiatry consult 3. Exercise and lose weight.  4. Diabetic eye examination, Diabetic eye exam is updated  5. Monitor blood sugar closlely. nutrition counseling.  6. Sign and symptoms of hypoglycemia including shaking sweating,confusion and headaches.  This patient was seen by Leretha Pol FNP Collaboration with Dr [name] as a part of collaborative care agreement  Orders Placed This Encounter  Procedures  . POCT HgB A1C    Meds ordered this encounter  Medications  . glyBURIDE (DIABETA) 5 MG tablet    Sig: Take 1 tablet (5 mg total) by mouth 2 (two) times daily with a meal.    Dispense:  60 tablet    Refill:  3    Please note increased dose    Order Specific Question:   Supervising Provider    Answer:   [name] [[id number]]    Time spent: 33 Minutes      Dr [name] Internal medicine

## 2019-09-26 ENCOUNTER — Other Ambulatory Visit: Payer: Self-pay | Admitting: Internal Medicine

## 2019-09-26 DIAGNOSIS — M19011 Primary osteoarthritis, right shoulder: Secondary | ICD-10-CM

## 2019-09-26 NOTE — Telephone Encounter (Signed)
Pt last seen on 12/20

## 2019-09-27 ENCOUNTER — Telehealth: Payer: Self-pay

## 2019-09-27 NOTE — Telephone Encounter (Signed)
FAXED REQUESTED RECORDS TO [facility] AND PLACED IN SCAN.

## 2019-10-15 DIAGNOSIS — Z89422 Acquired absence of other left toe(s): Secondary | ICD-10-CM | POA: Diagnosis not present

## 2019-10-15 DIAGNOSIS — I739 Peripheral vascular disease, unspecified: Secondary | ICD-10-CM | POA: Diagnosis not present

## 2019-10-15 DIAGNOSIS — B351 Tinea unguium: Secondary | ICD-10-CM | POA: Diagnosis not present

## 2019-10-15 DIAGNOSIS — E1142 Type 2 diabetes mellitus with diabetic polyneuropathy: Secondary | ICD-10-CM | POA: Diagnosis not present

## 2019-10-17 ENCOUNTER — Other Ambulatory Visit: Payer: Self-pay

## 2019-10-17 MED ORDER — LOVASTATIN 10 MG PO TABS: ORAL_TABLET | ORAL | 1 refills | Status: DC

## 2019-10-25 ENCOUNTER — Other Ambulatory Visit: Payer: Self-pay

## 2019-10-25 DIAGNOSIS — N3281 Overactive bladder: Secondary | ICD-10-CM

## 2019-10-25 MED ORDER — OXYBUTYNIN CHLORIDE 5 MG PO TABS: 5.0000 mg | ORAL_TABLET | Freq: Two times a day (BID) | ORAL | 3 refills | Status: DC

## 2019-11-13 DIAGNOSIS — I471 Supraventricular tachycardia: Secondary | ICD-10-CM | POA: Diagnosis not present

## 2019-11-13 DIAGNOSIS — I428 Other cardiomyopathies: Secondary | ICD-10-CM | POA: Diagnosis not present

## 2019-11-13 DIAGNOSIS — I10 Essential (primary) hypertension: Secondary | ICD-10-CM | POA: Diagnosis not present

## 2019-11-15 ENCOUNTER — Other Ambulatory Visit: Payer: Self-pay

## 2019-11-15 DIAGNOSIS — N3281 Overactive bladder: Secondary | ICD-10-CM

## 2019-11-15 MED ORDER — OXYBUTYNIN CHLORIDE 5 MG PO TABS: 5.0000 mg | ORAL_TABLET | Freq: Two times a day (BID) | ORAL | 0 refills | Status: DC

## 2019-11-20 ENCOUNTER — Ambulatory Visit (INDEPENDENT_AMBULATORY_CARE_PROVIDER_SITE_OTHER): Payer: Medicare Other | Admitting: Vascular Surgery

## 2019-11-20 ENCOUNTER — Ambulatory Visit (INDEPENDENT_AMBULATORY_CARE_PROVIDER_SITE_OTHER): Payer: Medicare Other

## 2019-11-20 ENCOUNTER — Other Ambulatory Visit: Payer: Self-pay

## 2019-11-20 ENCOUNTER — Encounter (INDEPENDENT_AMBULATORY_CARE_PROVIDER_SITE_OTHER): Payer: Self-pay | Admitting: Vascular Surgery

## 2019-11-20 VITALS — BP 128/70 | HR 66 | Resp 12 | Ht 70.0 in | Wt 180.0 lb

## 2019-11-20 DIAGNOSIS — E1151 Type 2 diabetes mellitus with diabetic peripheral angiopathy without gangrene: Secondary | ICD-10-CM

## 2019-11-20 DIAGNOSIS — E782 Mixed hyperlipidemia: Secondary | ICD-10-CM

## 2019-11-20 DIAGNOSIS — I10 Essential (primary) hypertension: Secondary | ICD-10-CM | POA: Diagnosis not present

## 2019-11-20 DIAGNOSIS — I70299 Other atherosclerosis of native arteries of extremities, unspecified extremity: Secondary | ICD-10-CM | POA: Diagnosis not present

## 2019-11-20 DIAGNOSIS — L97909 Non-pressure chronic ulcer of unspecified part of unspecified lower leg with unspecified severity: Secondary | ICD-10-CM | POA: Diagnosis not present

## 2019-11-20 DIAGNOSIS — I6521 Occlusion and stenosis of right carotid artery: Secondary | ICD-10-CM | POA: Diagnosis not present

## 2019-11-20 NOTE — Assessment & Plan Note (Signed)
ABIs today are 0.89 on the right and 0.73 on the left with relatively strong biphasic waveforms.  Digital pressures are in the normal range bilaterally.  No change from his previous studies.  Multiple previous interventions and his previous wounds have healed after revascularization.  We will continue to follow him on 5-month intervals.

## 2019-11-20 NOTE — Progress Notes (Signed)
MRN : [id number]  [name] is a 69 y.o. (08-Nov-1950) male who presents with chief complaint of  Chief Complaint  Patient presents with  . Follow-up    U/S Follow up  .  History of Present Illness: Patient returns today in follow up of his PAD.  He has undergone extensive revascularization in the past for limb salvage with ulcerations.  His ulcerations have healed after revascularization and he has done well.  He is also followed for carotid disease and has no new focal neurologic symptoms.  That is scheduled to be checked later this year. ABIs today are 0.89 on the right and 0.73 on the left with relatively strong biphasic waveforms.  Digital pressures are in the normal range bilaterally.  No change from his previous studies.  Current Outpatient Medications  Medication Sig Dispense Refill  . aspirin EC 81 MG tablet Take 81 mg by mouth daily.    . Calcium Carbonate-Vitamin D (CALCIUM PLUS VITAMIN D PO) Take 1 tablet by mouth daily.    . clopidogrel (PLAVIX) 75 MG tablet TAKE 1 TABLET BY MOUTH ONCE DAILY 90 tablet 3  . gabapentin (NEURONTIN) 300 MG capsule Take 1 capsule (300 mg total) by mouth 3 (three) times daily. 90 capsule 3  . glucose blood (ACCU-CHEK GUIDE) test strip Use as instructed 100 each 12  . glyBURIDE (DIABETA) 5 MG tablet Take 1 tablet (5 mg total) by mouth 2 (two) times daily with a meal. 60 tablet 3  . Lancets (ACCU-CHEK SOFT TOUCH) lancets Use as instructed 100 each 12  . lovastatin (MEVACOR) 10 MG tablet Take 1 tab  Po QHS 90 tablet 1  . meloxicam (MOBIC) 15 MG tablet Take by mouth.    . metoprolol succinate (TOPROL-XL) 100 MG 24 hr tablet Take 1 tablet (100 mg total) by mouth daily. Take with or immediately following a meal. 30 tablet 0  . mometasone-formoterol (DULERA) 100-5 MCG/ACT AERO Inhale 2 puffs into the lungs 2 (two) times daily as needed for wheezing.    Mable Fill (ALLERGY EYE OP) Place 1 drop into both eyes daily as needed (for  allergies).    . Omega-3 Fatty Acids (FISH OIL PO) Take 1 capsule by mouth daily.    . traMADol (ULTRAM) 50 MG tablet TAKE 1 TABLET BY MOUTH EVERY 12 HOURS ASNEEDED FOR PAIN 40 tablet 2  . vitamin B-12 (CYANOCOBALAMIN) 500 MCG tablet Take 500 mcg by mouth daily.     Marland Kitchen nystatin (MYCOSTATIN) 100000 UNIT/ML suspension Take 5 mLs (500,000 Units total) by mouth 4 (four) times daily. (Patient not taking: Reported on 11/20/2019) 200 mL 1  . oxybutynin (DITROPAN) 5 MG tablet Take 1 tablet (5 mg total) by mouth 2 (two) times daily. 180 tablet 0   No current facility-administered medications for this visit.    Past Medical History:  Diagnosis Date  . Cholecystitis, chronic   . Cholelithiasis   . COPD (chronic obstructive pulmonary disease) ([place])   . Diabetes mellitus without complication ([place])   . Diabetic retinopathy ([place])   . Dyspnea   . Fatty liver 2008  . Fracture of clavicle 1992   left   . Fracture of ribs, multiple 1992   3 ribs  . GERD (gastroesophageal reflux disease)   . Hyperlipidemia   . Hypertension   . Paroxysmal SVT (supraventricular tachycardia) ([place])   . Pelvis fracture ([place]) 1992  . Peripheral vascular disease ([place])   . Pleurisy   . Pleurisy   .  Seasonal allergies     Past Surgical History:  Procedure Laterality Date  . AMPUTATION TOE Left 02/25/2017   Procedure: AMPUTATION TOE-LEFT 2ND MPJ;  Surgeon: Samara Deist, DPM;  Location: [facility] ORS;  Service: Podiatry;  Laterality: Left;  . AMPUTATION TOE Left 10/20/2018   Procedure: TOE MPJ T2 LEFT;  Surgeon: Samara Deist, DPM;  Location: [facility];  Service: Podiatry;  Laterality: Left;  . AMPUTATION TOE Left 01/12/2019   Procedure: AMPUTATION LEFT 5TH TOE AND JOINT;  Surgeon: Samara Deist, DPM;  Location: [facility];  Service: Podiatry;  Laterality: Left;  . BACK SURGERY  2008  . CHOLECYSTECTOMY    . COLONOSCOPY WITH PROPOFOL N/A 08/15/2017   Procedure: COLONOSCOPY WITH PROPOFOL;  Surgeon: Lollie Sails, MD;   Location: [facility];  Service: Endoscopy;  Laterality: N/A;  . ENDARTERECTOMY Right 05/12/2017   Procedure: ENDARTERECTOMY CAROTID;  Surgeon: [name], MD;  Location: [facility] ORS;  Service: Vascular;  Laterality: Right;  . ENDARTERECTOMY FEMORAL Left 12/01/2018   Procedure: ENDARTERECTOMY FEMORAL;  Surgeon: [name], MD;  Location: [facility] OR[facility];  Service: Vascular;  Laterality: Left;  . ERCP    . FOOT SURGERY Right    x 3  . LOWER EXTREMITY ANGIOGRAM Left 12/01/2018   Procedure: LOWER EXTREMITY ANGIOGRAM;  Surgeon: [name], MD;  Location: [facility] OR[facility];  Service: Vascular;  Laterality: Left;  . LOWER EXTREMITY ANGIOGRAPHY Left 10/30/2018   Procedure: LOWER EXTREMITY ANGIOGRAPHY;  Surgeon: [name], MD;  Location: [place] [facility];  Service: Cardiovascular;  Laterality: Left;  . LOWER EXTREMITY ANGIOGRAPHY Left 11/30/2018   Procedure: LOWER EXTREMITY ANGIOGRAPHY;  Surgeon: [name], MD;  Location: [place] [facility];  Service: Cardiovascular;  Laterality: Left;    Social History        Tobacco Use  . Smoking status: Former Smoker    Last attempt to quit: 02/24/1997    Years since quitting: 20.5  . Smokeless tobacco: Never Used  Substance Use Topics  . Alcohol use: No  . Drug use: No     Family History      Family History  Problem Relation Age of Onset  . Diabetes Sister   no bleeding disorders, clotting disorders, or aneurysms        Allergies  Allergen Reactions  . Ampicillin   . Bactrim [Sulfamethoxazole-Trimethoprim] Other (See Comments)    Mouth sores Sores develop in mouth  . Amoxicillin Rash    Sores in my mouth  . Penicillins Rash    Rash in and around the mouth Has patient had a PCN reaction causing immediate rash, facial/tongue/throat swelling, SOB or lightheadedness with hypotension: Yes Has patient had a PCN reaction causing severe rash involving mucus membranes or skin necrosis: Yes Has patient had a PCN reaction  that required hospitalization: No Has patient had a PCN reaction occurring within the last 10 years: Yes If all of the above answers are "NO", then may proceed with Cephalosporin use.       REVIEW OF SYSTEMS(Negative unless checked)  Constitutional: [] ????Weight loss[] ????Fever[] ????Chills Cardiac:[] ????Chest pain[] ????Chest pressure[x] ????Palpitations [] ????Shortness of breath when laying flat [] ????Shortness of breath at rest [] ????Shortness of breath with exertion. Vascular: [] ????Pain in legs with walking[] ????Pain in legsat rest[] ????Pain in legs when laying flat [] ????Claudication [] ????Pain in feet when walking [] ????Pain in feet at rest [] ????Pain in feet when laying flat [] ????History of DVT [] ????Phlebitis [] ????Swelling in legs [] ????Varicose veins [x] ????Non-healing ulcers Pulmonary: [] ????Uses home oxygen [] ????Productive cough[] ????Hemoptysis [] ????Wheeze [] ????COPD [] ????Asthma Neurologic: [] ????Dizziness [] ????Blackouts [] ????  Seizures [] ????History of stroke [] ????History of TIA[] ????Aphasia [] ????Temporary blindness[] ????Dysphagia [] ????Weaknessor numbness in arms [x] ????Weakness or numbnessin legs Musculoskeletal: [x] ????Arthritis [] ????Joint swelling [] ????Joint pain [] ????Low back pain Hematologic:[] ????Easy bruising[] ????Easy bleeding [] ????Hypercoagulable state [] ????Anemic [] ????Hepatitis Gastrointestinal:[] ????Blood in stool[] ????Vomiting blood[] ????Gastroesophageal reflux/heartburn[] ????Difficulty swallowing. Genitourinary: [] ????Chronic kidney disease [] ????Difficulturination [] ????Frequenturination [] ????Burning with urination[] ????Blood in urine Skin: [] ????Rashes [x] ????Ulcers [x] ????Wounds Psychological: [] ????History of anxiety[] ????History of major depression.     Physical Examination  BP 128/70   Pulse 66   Resp 12   Ht 5\' 10"   (1.778 m)   Wt 180 lb (81.6 kg)   BMI 25.83 kg/m  Gen:  WD/WN, NAD Head: [place]/AT, No temporalis wasting. Ear/Nose/Throat: Hearing grossly intact, nares w/o erythema or drainage Eyes: Conjunctiva clear. Sclera non-icteric Neck: Supple.  Trachea midline Pulmonary:  Good air movement, no use of accessory muscles.  Cardiac: RRR, no JVD Vascular:  Vessel Right Left  Radial Palpable Palpable                          PT Palpable Palpable  DP Palpable Palpable   Gastrointestinal: soft, non-tender/non-distended. No guarding/reflex.  Musculoskeletal: M/S 5/5 throughout.  No deformity or atrophy. Trace LE edema. Neurologic: Sensation grossly intact in extremities.  Symmetrical.  Speech is fluent.  Psychiatric: Judgment intact, Mood & affect appropriate for pt's clinical situation. Dermatologic: No rashes or ulcers noted.  No cellulitis or open wounds.       Labs Recent Results (from the past 2160 hour(s))  POCT HgB A1C     Status: Abnormal   Collection Time: 09/17/19  8:31 AM  Result Value Ref Range   Hemoglobin A1C 8.1 (A) 4.0 - 5.6 %   HbA1c POC (<> result, manual entry)     HbA1c, POC (prediabetic range)     HbA1c, POC (controlled diabetic range)      Radiology No results found.  Assessment/Plan Hyperlipidemia lipid control important in reducing the progression of atherosclerotic disease. Continue statin therapy   Type 2 diabetes mellitus with complication (HCC) blood glucose control important in reducing the progression of atherosclerotic disease. Also, involved in wound healing. On appropriate medications.  Essential hypertension blood pressure control important in reducing the progression of atherosclerotic disease. On appropriate oral medications.  Carotid stenosis, right No new symptoms.  To be checked later this year  Atherosclerosis of artery of extremity with ulceration (HCC) ABIs today are 0.89 on the right and 0.73 on the left with relatively strong  biphasic waveforms.  Digital pressures are in the normal range bilaterally.  No change from his previous studies.  Multiple previous interventions and his previous wounds have healed after revascularization.  We will continue to follow him on 64-month intervals.    Leotis Pain, MD  11/20/2019 10:01 AM    This note was created with Dragon medical transcription system.  Any errors from dictation are purely unintentional

## 2019-11-20 NOTE — Patient Instructions (Signed)
Peripheral Vascular Disease  Peripheral vascular disease (PVD) is a disease of the blood vessels that are not part of your heart and brain. A simple term for PVD is poor circulation. In most cases, PVD narrows the blood vessels that carry blood from your heart to the rest of your body. This can reduce the supply of blood to your arms, legs, and internal organs, like your stomach or kidneys. However, PVD most often affects a person's lower legs and feet. Without treatment, PVD tends to get worse. PVD can also lead to acute ischemic limb. This is when an arm or leg suddenly cannot get enough blood. This is a medical emergency. Follow these instructions at home: Lifestyle  Do not use any products that contain nicotine or tobacco, such as cigarettes and e-cigarettes. If you need help quitting, ask your doctor.  Lose weight if you are overweight. Or, stay at a healthy weight as told by your doctor.  Eat a diet that is low in fat and cholesterol. If you need help, ask your doctor.  Exercise regularly. Ask your doctor for activities that are right for you. General instructions  Take over-the-counter and prescription medicines only as told by your doctor.  Take good care of your feet: ? Wear comfortable shoes that fit well. ? Check your feet often for any cuts or sores.  Keep all follow-up visits as told by your doctor This is important. Contact a doctor if:  You have cramps in your legs when you walk.  You have leg pain when you are at rest.  You have coldness in a leg or foot.  Your skin changes.  You are unable to get or have an erection (erectile dysfunction).  You have cuts or sores on your feet that do not heal. Get help right away if:  Your arm or leg turns cold, numb, and blue.  Your arms or legs become red, warm, swollen, painful, or numb.  You have chest pain.  You have trouble breathing.  You suddenly have weakness in your face, arm, or leg.  You become very  confused or you cannot speak.  You suddenly have a very bad headache.  You suddenly cannot see. Summary  Peripheral vascular disease (PVD) is a disease of the blood vessels.  A simple term for PVD is poor circulation. Without treatment, PVD tends to get worse.  Treatment may include exercise, low fat and low cholesterol diet, and quitting smoking. This information is not intended to replace advice given to you by your health care provider. Make sure you discuss any questions you have with your health care provider. Document Revised: 08/26/2017 Document Reviewed: 10/21/2016 Elsevier Patient Education  2020 [employer]  

## 2019-11-20 NOTE — Assessment & Plan Note (Signed)
No new symptoms.  To be checked later this year

## 2019-11-23 ENCOUNTER — Other Ambulatory Visit: Payer: Self-pay | Admitting: Nurse Practitioner

## 2019-11-23 DIAGNOSIS — M19012 Primary osteoarthritis, left shoulder: Secondary | ICD-10-CM

## 2019-11-23 DIAGNOSIS — M19011 Primary osteoarthritis, right shoulder: Secondary | ICD-10-CM

## 2019-11-23 MED ORDER — TRAMADOL HCL 50 MG PO TABS: ORAL_TABLET | ORAL | 2 refills | Status: DC

## 2019-11-23 NOTE — Progress Notes (Signed)
Approved rx for tramadol per pharmacy request.

## 2019-11-30 DIAGNOSIS — I10 Essential (primary) hypertension: Secondary | ICD-10-CM | POA: Diagnosis not present

## 2019-11-30 DIAGNOSIS — E7800 Pure hypercholesterolemia, unspecified: Secondary | ICD-10-CM | POA: Diagnosis not present

## 2019-12-08 DIAGNOSIS — E7800 Pure hypercholesterolemia, unspecified: Secondary | ICD-10-CM | POA: Diagnosis not present

## 2019-12-08 DIAGNOSIS — I10 Essential (primary) hypertension: Secondary | ICD-10-CM | POA: Diagnosis not present

## 2019-12-13 ENCOUNTER — Telehealth: Payer: Self-pay

## 2019-12-13 NOTE — Telephone Encounter (Signed)
LMOM FOR PATIENT TO SCREEN AND CONFIRM FOR 12-17-19 OV.

## 2019-12-13 NOTE — Telephone Encounter (Signed)
Confirmed appointment on 12/17/2019 and screened for covid. klh 

## 2019-12-17 ENCOUNTER — Telehealth: Payer: Self-pay

## 2019-12-17 ENCOUNTER — Encounter: Payer: Self-pay | Admitting: Nurse Practitioner

## 2019-12-17 ENCOUNTER — Ambulatory Visit (INDEPENDENT_AMBULATORY_CARE_PROVIDER_SITE_OTHER): Payer: Medicare Other | Admitting: Nurse Practitioner

## 2019-12-17 ENCOUNTER — Other Ambulatory Visit: Payer: Self-pay

## 2019-12-17 VITALS — BP 118/46 | HR 68 | Temp 96.8°F | Resp 16 | Ht 70.0 in | Wt 184.4 lb

## 2019-12-17 DIAGNOSIS — M19011 Primary osteoarthritis, right shoulder: Secondary | ICD-10-CM

## 2019-12-17 DIAGNOSIS — E1165 Type 2 diabetes mellitus with hyperglycemia: Secondary | ICD-10-CM

## 2019-12-17 DIAGNOSIS — M19012 Primary osteoarthritis, left shoulder: Secondary | ICD-10-CM | POA: Diagnosis not present

## 2019-12-17 DIAGNOSIS — I70269 Atherosclerosis of native arteries of extremities with gangrene, unspecified extremity: Secondary | ICD-10-CM | POA: Diagnosis not present

## 2019-12-17 LAB — POCT GLYCOSYLATED HEMOGLOBIN (HGB A1C): Hemoglobin A1C: 7.1 % — AB (ref 4.0–5.6)

## 2019-12-17 MED ORDER — FARXIGA 10 MG PO TABS: 10.0000 mg | ORAL_TABLET | Freq: Every day | ORAL | 3 refills | Status: DC

## 2019-12-17 MED ORDER — GLYBURIDE 5 MG PO TABS: 5.0000 mg | ORAL_TABLET | Freq: Two times a day (BID) | ORAL | 3 refills | Status: DC

## 2019-12-17 MED ORDER — TRAMADOL HCL 50 MG PO TABS: ORAL_TABLET | ORAL | 2 refills | Status: DC

## 2019-12-17 NOTE — Telephone Encounter (Signed)
Phar called that farxiga is not covered by insurance as per heather change back to glyburide and send to phar and also d/c farxiga

## 2019-12-17 NOTE — Progress Notes (Signed)
[facility] [place], [place] [address]  Internal MEDICINE  Office Visit Note  Patient Name: [name]  [id number]  UT:[id number]  Date of Service: 12/17/2019  Chief Complaint  Patient presents with  . Diabetes  . Hypertension  . Hyperlipidemia  . Gastroesophageal Reflux  . Medication Refill    insurance will no longer pay for diabetes med     The patient is here for routine follow up. Blood sugars improved from last visit. HgbA1c 7.1, down from 8.1 at his last check. Diastolic blood pressure is a little low. He does see a cardiologist. This is monitored closely. He feels good. Has no chest pain, pressure, or shortness of breath. No new headaches. He states his insurance company will no longer pay for glyburide. He takes 5mg  twice daily.  Continues to take tramadol 50mg  tablets as needed to help with moderate to severe pain. He does have pain in the left shoulder. He also has bulging disk in the lumbar spine which is not operable. He has been taking this prescription for some time. Helps to relieve his pain and allows him to participate in usual activities and continue working. He has no negative side effects from taking this medication.          Current Medication: Outpatient Encounter Medications as of 12/17/2019  Medication Sig  . aspirin EC 81 MG tablet Take 81 mg by mouth daily.  . Calcium Carbonate-Vitamin D (CALCIUM PLUS VITAMIN D PO) Take 1 tablet by mouth daily.  . clopidogrel (PLAVIX) 75 MG tablet TAKE 1 TABLET BY MOUTH ONCE DAILY  . gabapentin (NEURONTIN) 300 MG capsule Take 1 capsule (300 mg total) by mouth 3 (three) times daily.  Marland Kitchen glucose blood (ACCU-CHEK GUIDE) test strip Use as instructed  . glyBURIDE (DIABETA) 5 MG tablet Take 1 tablet (5 mg total) by mouth 2 (two) times daily with a meal.  . Lancets (ACCU-CHEK SOFT TOUCH) lancets Use as instructed  . lovastatin (MEVACOR) 10 MG tablet Take 1 tab  Po QHS  . meloxicam (MOBIC) 15 MG tablet  Take by mouth.  . metoprolol succinate (TOPROL-XL) 100 MG 24 hr tablet Take 1 tablet (100 mg total) by mouth daily. Take with or immediately following a meal.  . mometasone-formoterol (DULERA) 100-5 MCG/ACT AERO Inhale 2 puffs into the lungs 2 (two) times daily as needed for wheezing.  Mable Fill (ALLERGY EYE OP) Place 1 drop into both eyes daily as needed (for allergies).  . nystatin (MYCOSTATIN) 100000 UNIT/ML suspension Take 5 mLs (500,000 Units total) by mouth 4 (four) times daily.  . Omega-3 Fatty Acids (FISH OIL PO) Take 1 capsule by mouth daily.  Marland Kitchen oxybutynin (DITROPAN) 5 MG tablet Take 1 tablet (5 mg total) by mouth 2 (two) times daily.  . traMADol (ULTRAM) 50 MG tablet TAKE 1 TABLET BY MOUTH EVERY 12 HOURS ASNEEDED FOR PAIN  . vitamin B-12 (CYANOCOBALAMIN) 500 MCG tablet Take 500 mcg by mouth daily.   . [DISCONTINUED] traMADol (ULTRAM) 50 MG tablet TAKE 1 TABLET BY MOUTH EVERY 12 HOURS ASNEEDED FOR PAIN  . dapagliflozin propanediol (FARXIGA) 10 MG TABS tablet Take 10 mg by mouth daily.   No facility-administered encounter medications on file as of 12/17/2019.    Surgical History: Past Surgical History:  Procedure Laterality Date  . AMPUTATION TOE Left 02/25/2017   Procedure: AMPUTATION TOE-LEFT 2ND MPJ;  Surgeon: Samara Deist, DPM;  Location: [facility] ORS;  Service: Podiatry;  Laterality: Left;  . AMPUTATION TOE  Left 10/20/2018   Procedure: TOE MPJ T2 LEFT;  Surgeon: Samara Deist, DPM;  Location: [facility];  Service: Podiatry;  Laterality: Left;  . AMPUTATION TOE Left 01/12/2019   Procedure: AMPUTATION LEFT 5TH TOE AND JOINT;  Surgeon: Samara Deist, DPM;  Location: [facility];  Service: Podiatry;  Laterality: Left;  . BACK SURGERY  2008  . CHOLECYSTECTOMY    . COLONOSCOPY WITH PROPOFOL N/A 08/15/2017   Procedure: COLONOSCOPY WITH PROPOFOL;  Surgeon: Lollie Sails, MD;  Location[facility];  Service: Endoscopy;  Laterality: N/A;  . ENDARTERECTOMY Right 05/12/2017    Procedure: ENDARTERECTOMY CAROTID;  Surgeon: [name], MD;  Location: [facility] ORS;  Service: Vascular;  Laterality: Right;  . ENDARTERECTOMY FEMORAL Left 12/01/2018   Procedure: ENDARTERECTOMY FEMORAL;  Surgeon: [name], MD;  Location: [facility] ORS;  Service: Vascular;  Laterality: Left;  . ERCP    . FOOT SURGERY Right    x 3  . LOWER EXTREMITY ANGIOGRAM Left 12/01/2018   Procedure: LOWER EXTREMITY ANGIOGRAM;  Surgeon: [name], MD;  Location: [facility] ORS;  Service: Vascular;  Laterality: Left;  . LOWER EXTREMITY ANGIOGRAPHY Left 10/30/2018   Procedure: LOWER EXTREMITY ANGIOGRAPHY;  Surgeon: [name], MD;  Location: [place] [facility];  Service: Cardiovascular;  Laterality: Left;  . LOWER EXTREMITY ANGIOGRAPHY Left 11/30/2018   Procedure: LOWER EXTREMITY ANGIOGRAPHY;  Surgeon: [name], MD;  Location: [place] [facility];  Service: Cardiovascular;  Laterality: Left;    Medical History: Past Medical History:  Diagnosis Date  . Cholecystitis, chronic   . Cholelithiasis   . COPD (chronic obstructive pulmonary disease) ([place])   . Diabetes mellitus without complication ([place])   . Diabetic retinopathy ([place])   . Dyspnea   . Fatty liver 2008  . Fracture of clavicle 1992   left   . Fracture of ribs, multiple 1992   3 ribs  . GERD (gastroesophageal reflux disease)   . Hyperlipidemia   . Hypertension   . Paroxysmal SVT (supraventricular tachycardia) ([place])   . Pelvis fracture ([place]) 1992  . Peripheral vascular disease ([place])   . Pleurisy   . Pleurisy   . Seasonal allergies     Family History: Family History  Problem Relation Age of Onset  . Diabetes Sister     Social History   Socioeconomic History  . Marital status: Single    Spouse name: Not on file  . Number of children: Not on file  . Years of education: Not on file  . Highest education level: Not on file  Occupational History  . Not on file  Tobacco Use  . Smoking status: Former Smoker    Quit date: 02/24/1997     Years since quitting: 22.8  . Smokeless tobacco: Never Used  Substance and Sexual Activity  . Alcohol use: No  . Drug use: No  . Sexual activity: Not on file  Other Topics Concern  . Not on file  Social History Narrative  . Not on file   Social Determinants of Health   Financial Resource Strain:   . Difficulty of Paying Living Expenses:   Food Insecurity:   . Worried About Charity fundraiser in the Last Year:   . Arboriculturist in the Last Year:   Transportation Needs:   . Film/video editor (Medical):   Marland Kitchen Lack of Transportation (Non-Medical):   Physical Activity:   . Days of Exercise per Week:   . Minutes of Exercise  per Session:   Stress:   . Feeling of Stress :   Social Connections:   . Frequency of Communication with Friends and Family:   . Frequency of Social Gatherings with Friends and Family:   . Attends Religious Services:   . Active Member of Clubs or Organizations:   . Attends Archivist Meetings:   Marland Kitchen Marital Status:   Intimate Partner Violence:   . Fear of Current or Ex-Partner:   . Emotionally Abused:   Marland Kitchen Physically Abused:   . Sexually Abused:       Review of Systems  Constitutional: Negative for activity change, chills, fatigue, fever and unexpected weight change.  HENT: Negative for congestion, postnasal drip, rhinorrhea, sinus pressure, sneezing and sore throat.   Respiratory: Positive for cough and wheezing. Negative for chest tightness and shortness of breath.        Intermittent and generally well controlled.   Cardiovascular: Negative for chest pain and palpitations.  Gastrointestinal: Negative for abdominal distention, abdominal pain, anal bleeding, constipation, diarrhea, nausea and vomiting.  Endocrine: Negative for cold intolerance, heat intolerance and polyuria.       Blood sugars improved today with HgbA1c 7.1  Genitourinary: Negative for urgency.  Musculoskeletal: Positive for arthralgias, back pain and myalgias. Negative  for joint swelling and neck pain.       Chronic, bilateral shoulder pain. Intermittent and made worse with exertion.   Skin: Negative for color change, pallor, rash and wound.  Allergic/Immunologic: Positive for environmental allergies.  Neurological: Negative for tremors, numbness and headaches.  Hematological: Negative for adenopathy. Does not bruise/bleed easily.  Psychiatric/Behavioral: Negative for behavioral problems (Depression), sleep disturbance and suicidal ideas. The patient is not nervous/anxious.     Today's Vitals   12/17/19 0832  BP: (!) 118/46  Pulse: 68  Resp: 16  Temp: (!) 96.8 F (36 C)  SpO2: 94%  Weight: 184 lb 6.4 oz (83.6 kg)  Height: 5\' 10"  (1.778 m)   Body mass index is 26.46 kg/m.  Physical Exam Vitals and nursing note reviewed.  Constitutional:      General: He is not in acute distress.    Appearance: Normal appearance. He is well-developed. He is not diaphoretic.  HENT:     Head: Normocephalic and atraumatic.     Mouth/Throat:     Pharynx: No oropharyngeal exudate.  Eyes:     Pupils: Pupils are equal, round, and reactive to light.  Neck:     Thyroid: No thyromegaly.     Vascular: Carotid bruit present. No JVD.     Trachea: No tracheal deviation.     Comments: Soft carotid bruit over right side of the neck.  Cardiovascular:     Rate and Rhythm: Normal rate and regular rhythm.     Heart sounds: Normal heart sounds. No murmur. No friction rub. No gallop.   Pulmonary:     Effort: Pulmonary effort is normal. No respiratory distress.     Breath sounds: Normal breath sounds. No wheezing or rales.     Comments: Mildly congested breath sounds in bilateral lung bases. Clear with cough.  Chest:     Chest wall: No tenderness.  Abdominal:     Palpations: Abdomen is soft.  Musculoskeletal:        General: Normal range of motion.     Cervical back: Normal range of motion and neck supple.  Lymphadenopathy:     Cervical: No cervical adenopathy.   Skin:    General: Skin is warm  and dry.  Neurological:     Mental Status: He is alert and oriented to person, place, and time.     Cranial Nerves: No cranial nerve deficit.  Psychiatric:        Behavior: Behavior normal.        Thought Content: Thought content normal.        Judgment: Judgment normal.    Assessment/Plan:  1. Type 2 diabetes mellitus with hyperglycemia, without long-term current use of insulin (HCC) - POCT HgB A1C improved today at 7.1. change glyburide to [place] 10mg  daily, as insurance will no longer cover glyburide. Samples provided. New prescription sent to his pharmacy.  - dapagliflozin propanediol (FARXIGA) 10 MG TABS tablet; Take 10 mg by mouth daily.  Dispense: 30 tablet; Refill: 3  2. Primary osteoarthritis of shoulders, bilateral May continue to take tramadol 50mg  up to twice daily if needed. He is unable to take NSAIDs as he takes Plavix.  - traMADol (ULTRAM) 50 MG tablet; TAKE 1 TABLET BY MOUTH EVERY 12 HOURS ASNEEDED FOR PAIN  Dispense: 40 tablet; Refill: 2  3. 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 Lucky Cowboy for vascular assessment.    General Counseling: Caesar verbalizes understanding of the findings of todays visit and agrees with plan of treatment. I have discussed any further diagnostic evaluation that may be needed or ordered today. We also reviewed his medications today. he has been encouraged to call the office with any questions or concerns that should arise related to todays visit.  Diabetes Counseling:  1. Addition of ACE inh/ ARB'S for nephroprotection. Microalbumin is updated  2. Diabetic foot care, prevention of complications. Podiatry consult 3. Exercise and lose weight.  4. Diabetic eye examination, Diabetic eye exam is updated  5. Monitor blood sugar closlely. nutrition counseling.  6. Sign and symptoms of hypoglycemia including shaking sweating,confusion and  headaches.  This patient was seen by Leretha Pol FNP Collaboration with Dr [name] as a part of collaborative care agreement  Orders Placed This Encounter  Procedures  . POCT HgB A1C    Meds ordered this encounter  Medications  . dapagliflozin propanediol (FARXIGA) 10 MG TABS tablet    Sig: Take 10 mg by mouth daily.    Dispense:  30 tablet    Refill:  3    Patient's insurance will no longer pay for glyburide.    Order Specific Question:   Supervising Provider    Answer:   [name] [id number]  . traMADol (ULTRAM) 50 MG tablet    Sig: TAKE 1 TABLET BY MOUTH EVERY 12 HOURS ASNEEDED FOR PAIN    Dispense:  40 tablet    Refill:  2    Order Specific Question:   Supervising Provider    Answer:   [name] [id number]    Total time spent: 30Minutes   Time spent includes review of chart, medications, test results, and follow up plan with the patient.      Dr [name] Internal medicine

## 2019-12-25 DIAGNOSIS — E113313 Type 2 diabetes mellitus with moderate nonproliferative diabetic retinopathy with macular edema, bilateral: Secondary | ICD-10-CM | POA: Diagnosis not present

## 2019-12-31 ENCOUNTER — Telehealth: Payer: Self-pay

## 2019-12-31 NOTE — Telephone Encounter (Signed)
PRIOR AUTHORIZATION FOR GLYBURIDE HAS BEEN APPROVED...VALID FROM 11/26/19-UNTIL FURTHER NOTICE.TAT

## 2020-01-01 ENCOUNTER — Encounter (INDEPENDENT_AMBULATORY_CARE_PROVIDER_SITE_OTHER): Payer: Medicare Other

## 2020-01-01 ENCOUNTER — Ambulatory Visit (INDEPENDENT_AMBULATORY_CARE_PROVIDER_SITE_OTHER): Payer: Medicare Other | Admitting: Vascular Surgery

## 2020-01-23 DIAGNOSIS — Z23 Encounter for immunization: Secondary | ICD-10-CM | POA: Diagnosis not present

## 2020-01-29 ENCOUNTER — Other Ambulatory Visit: Payer: Self-pay

## 2020-01-29 MED ORDER — GABAPENTIN 300 MG PO CAPS: 300.0000 mg | ORAL_CAPSULE | Freq: Three times a day (TID) | ORAL | 1 refills | Status: DC

## 2020-02-18 DIAGNOSIS — I739 Peripheral vascular disease, unspecified: Secondary | ICD-10-CM | POA: Diagnosis not present

## 2020-02-18 DIAGNOSIS — E1142 Type 2 diabetes mellitus with diabetic polyneuropathy: Secondary | ICD-10-CM | POA: Diagnosis not present

## 2020-02-18 DIAGNOSIS — D2371 Other benign neoplasm of skin of right lower limb, including hip: Secondary | ICD-10-CM | POA: Diagnosis not present

## 2020-02-18 DIAGNOSIS — Z89422 Acquired absence of other left toe(s): Secondary | ICD-10-CM | POA: Diagnosis not present

## 2020-02-18 DIAGNOSIS — B351 Tinea unguium: Secondary | ICD-10-CM | POA: Diagnosis not present

## 2020-02-21 DIAGNOSIS — Z23 Encounter for immunization: Secondary | ICD-10-CM | POA: Diagnosis not present

## 2020-03-18 ENCOUNTER — Telehealth: Payer: Self-pay

## 2020-03-18 NOTE — Telephone Encounter (Signed)
Lmom to confirm and screen for 03-20-20 ov.

## 2020-03-18 NOTE — Telephone Encounter (Signed)
error 

## 2020-03-20 ENCOUNTER — Encounter: Payer: Self-pay | Admitting: Nurse Practitioner

## 2020-03-20 ENCOUNTER — Ambulatory Visit (INDEPENDENT_AMBULATORY_CARE_PROVIDER_SITE_OTHER): Payer: Medicare Other | Admitting: Nurse Practitioner

## 2020-03-20 ENCOUNTER — Other Ambulatory Visit: Payer: Self-pay

## 2020-03-20 VITALS — BP 128/58 | HR 66 | Temp 97.0°F | Resp 16 | Ht 70.0 in | Wt 182.6 lb

## 2020-03-20 DIAGNOSIS — M150 Primary generalized (osteo)arthritis: Secondary | ICD-10-CM | POA: Diagnosis not present

## 2020-03-20 DIAGNOSIS — E782 Mixed hyperlipidemia: Secondary | ICD-10-CM

## 2020-03-20 DIAGNOSIS — I10 Essential (primary) hypertension: Secondary | ICD-10-CM | POA: Diagnosis not present

## 2020-03-20 DIAGNOSIS — E1165 Type 2 diabetes mellitus with hyperglycemia: Secondary | ICD-10-CM | POA: Diagnosis not present

## 2020-03-20 LAB — POCT GLYCOSYLATED HEMOGLOBIN (HGB A1C): Hemoglobin A1C: 7.2 % — AB (ref 4.0–5.6)

## 2020-03-20 NOTE — Progress Notes (Signed)
[facility] [place] [place] [address]  Internal MEDICINE  Office Visit Note  Patient Name: [name]  [id number]  [id number]  Date of Service: 03/20/2020  Chief Complaint  Patient presents with   Follow-up   Diabetes   Gastroesophageal Reflux   Hypertension   Hyperlipidemia   COPD    The patient is here for follow up visit. Blood sugars are stable. HgbA1c 7.2 today. Blood pressure well managed. He is scheduled to have diabetic eye exam on 03/24/2020. He states that he has had both of his COVID 19 vaccines. He does not remember the dates. Will call back later on to give [place] dates. Blood pressure is well controlled. Has no concerns or complaints today. He does see podiatry and vein and vascular providers on regular basis. He does have some nodular growths on the inner aspect in the right and middle fingers of left hand.  He states that these do not hurt, just "feel funny." nodules do not interfere with is ability to use the left hand.       Current Medication: Outpatient Encounter Medications as of 03/20/2020  Medication Sig   aspirin EC 81 MG tablet Take 81 mg by mouth daily.   Calcium Carbonate-Vitamin D (CALCIUM PLUS VITAMIN D PO) Take 1 tablet by mouth daily.   clopidogrel (PLAVIX) 75 MG tablet TAKE 1 TABLET BY MOUTH ONCE DAILY   gabapentin (NEURONTIN) 300 MG capsule Take 1 capsule (300 mg total) by mouth 3 (three) times daily.   glucose blood (ACCU-CHEK GUIDE) test strip Use as instructed   glyBURIDE (DIABETA) 5 MG tablet Take 1 tablet (5 mg total) by mouth 2 (two) times daily with a meal.   Lancets (ACCU-CHEK SOFT TOUCH) lancets Use as instructed   lovastatin (MEVACOR) 10 MG tablet Take 1 tab  Po QHS   meloxicam (MOBIC) 15 MG tablet Take by mouth.   metoprolol succinate (TOPROL-XL) 100 MG 24 hr tablet Take 1 tablet (100 mg total) by mouth daily. Take with or immediately following a meal.   mometasone-formoterol (DULERA) 100-5  MCG/ACT AERO Inhale 2 puffs into the lungs 2 (two) times daily as needed for wheezing.   Naphazoline-Pheniramine (ALLERGY EYE OP) Place 1 drop into both eyes daily as needed (for allergies).   nystatin (MYCOSTATIN) 100000 UNIT/ML suspension Take 5 mLs (500,000 Units total) by mouth 4 (four) times daily.   Omega-3 Fatty Acids (FISH OIL PO) Take 1 capsule by mouth daily.   oxybutynin (DITROPAN) 5 MG tablet Take 1 tablet (5 mg total) by mouth 2 (two) times daily.   traMADol (ULTRAM) 50 MG tablet TAKE 1 TABLET BY MOUTH EVERY 12 HOURS ASNEEDED FOR PAIN   vitamin B-12 (CYANOCOBALAMIN) 500 MCG tablet Take 500 mcg by mouth daily.    No facility-administered encounter medications on file as of 03/20/2020.    Surgical History: Past Surgical History:  Procedure Laterality Date   AMPUTATION TOE Left 02/25/2017   Procedure: AMPUTATION TOE-LEFT 2ND MPJ;  Surgeon: Samara Deist, DPM;  Location: [facility] ORS;  Service: Podiatry;  Laterality: Left;   AMPUTATION TOE Left 10/20/2018   Procedure: TOE MPJ T2 LEFT;  Surgeon: Samara Deist, DPM;  Location: [facility] ORS;  Service: Podiatry;  Laterality: Left;   AMPUTATION TOE Left 01/12/2019   Procedure: AMPUTATION LEFT 5TH TOE AND JOINT;  Surgeon: Samara Deist, DPM;  Location: [facility] ORS;  Service: Podiatry;  Laterality: Left;   BACK SURGERY  2008   CHOLECYSTECTOMY     COLONOSCOPY  WITH PROPOFOL N/A 08/15/2017   Procedure: COLONOSCOPY WITH PROPOFOL;  Surgeon: Lollie Sails, MD;  Location: [facility] ENDOSCOPY;  Service: Endoscopy;  Laterality: N/A;   ENDARTERECTOMY Right 05/12/2017   Procedure: ENDARTERECTOMY CAROTID;  Surgeon: [name], MD;  Location: [facility] ORS;  Service: Vascular;  Laterality: Right;   ENDARTERECTOMY FEMORAL Left 12/01/2018   Procedure: ENDARTERECTOMY FEMORAL;  Surgeon: [name], MD;  Location: [facility] ORS;  Service: Vascular;  Laterality: Left;   ERCP     FOOT SURGERY Right    x 3   LOWER EXTREMITY ANGIOGRAM Left 12/01/2018    Procedure: LOWER EXTREMITY ANGIOGRAM;  Surgeon: [name], MD;  Location: [facility] ORS;  Service: Vascular;  Laterality: Left;   LOWER EXTREMITY ANGIOGRAPHY Left 10/30/2018   Procedure: LOWER EXTREMITY ANGIOGRAPHY;  Surgeon: [name], MD;  Location: [place] [facility];  Service: Cardiovascular;  Laterality: Left;   LOWER EXTREMITY ANGIOGRAPHY Left 11/30/2018   Procedure: LOWER EXTREMITY ANGIOGRAPHY;  Surgeon: [name], MD;  Location: [place] [facility];  Service: Cardiovascular;  Laterality: Left;    Medical History: Past Medical History:  Diagnosis Date   Cholecystitis, chronic    Cholelithiasis    COPD (chronic obstructive pulmonary disease) ([place])    Diabetes mellitus without complication (HCC)    Diabetic retinopathy ([place])    Dyspnea    Fatty liver 2008   Fracture of clavicle 1992   left    Fracture of ribs, multiple 1992   3 ribs   GERD (gastroesophageal reflux disease)    Hyperlipidemia    Hypertension    Paroxysmal SVT (supraventricular tachycardia) (HCC)    Pelvis fracture ([place]) 1992   Peripheral vascular disease ([place])    Pleurisy    Pleurisy    Seasonal allergies     Family History: Family History  Problem Relation Age of Onset   Diabetes Sister     Social History   Socioeconomic History   Marital status: Single    Spouse name: Not on file   Number of children: Not on file   Years of education: Not on file   Highest education level: Not on file  Occupational History   Not on file  Tobacco Use   Smoking status: Former Smoker    Quit date: 02/24/1997    Years since quitting: 23.0   Smokeless tobacco: Never Used  Vaping Use   Vaping Use: Never used  Substance and Sexual Activity   Alcohol use: No   Drug use: No   Sexual activity: Not on file  Other Topics Concern   Not on file  Social History Narrative   Not on file   Social Determinants of Health   Financial Resource Strain:    Difficulty of Paying  Living Expenses:   Food Insecurity:    Worried About Charity fundraiser in the Last Year:    Arboriculturist in the Last Year:   Transportation Needs:    Film/video editor (Medical):    Lack of Transportation (Non-Medical):   Physical Activity:    Days of Exercise per Week:    Minutes of Exercise per Session:   Stress:    Feeling of Stress :   Social Connections:    Frequency of Communication with Friends and Family:    Frequency of Social Gatherings with Friends and Family:    Attends Religious Services:    Active Member of Clubs or Organizations:    Attends Archivist  Meetings:    Marital Status:   Intimate Partner Violence:    Fear of Current or Ex-Partner:    Emotionally Abused:    Physically Abused:    Sexually Abused:       Review of Systems  Constitutional: Negative for activity change, chills, fatigue, fever and unexpected weight change.  HENT: Negative for congestion, postnasal drip, rhinorrhea, sinus pressure, sneezing and sore throat.   Respiratory: Positive for cough and wheezing. Negative for chest tightness and shortness of breath.        Intermittent and generally well controlled.   Cardiovascular: Negative for chest pain and palpitations.  Gastrointestinal: Negative for abdominal distention, abdominal pain, anal bleeding, constipation, diarrhea, nausea and vomiting.  Endocrine: Negative for cold intolerance, heat intolerance and polyuria.       Blood sugars improved today with HgbA1c 7.2  Genitourinary: Negative for urgency.  Musculoskeletal: Positive for arthralgias, back pain and myalgias. Negative for joint swelling and neck pain.       Chronic, bilateral shoulder pain. Intermittent and made worse with exertion. Nodular growth on inner aspect of the middle and ring fingers of the left hand.   Skin: Negative for color change, pallor, rash and wound.  Allergic/Immunologic: Positive for environmental allergies.  Neurological:  Negative for dizziness, tremors, numbness and headaches.  Hematological: Negative for adenopathy. Does not bruise/bleed easily.  Psychiatric/Behavioral: Negative for behavioral problems (Depression), sleep disturbance and suicidal ideas. The patient is not nervous/anxious.     Today's Vitals   03/20/20 0826  BP: (!) 128/58  Pulse: 66  Resp: 16  Temp: (!) 97 F (36.1 C)  SpO2: 96%  Weight: 182 lb 9.6 oz (82.8 kg)  Height: 5\' 10"  (1.778 m)   Body mass index is 26.2 kg/m.  Physical Exam Vitals and nursing note reviewed.  Constitutional:      General: He is not in acute distress.    Appearance: Normal appearance. He is well-developed. He is not diaphoretic.  HENT:     Head: Normocephalic and atraumatic.     Mouth/Throat:     Pharynx: No oropharyngeal exudate.  Eyes:     Pupils: Pupils are equal, round, and reactive to light.  Neck:     Thyroid: No thyromegaly.     Vascular: No carotid bruit or JVD.     Trachea: No tracheal deviation.  Cardiovascular:     Rate and Rhythm: Normal rate and regular rhythm.     Heart sounds: Normal heart sounds. No murmur heard.  No friction rub. No gallop.   Pulmonary:     Effort: Pulmonary effort is normal. No respiratory distress.     Breath sounds: Normal breath sounds. No wheezing or rales.  Chest:     Chest wall: No tenderness.  Abdominal:     Palpations: Abdomen is soft.  Musculoskeletal:        General: Normal range of motion.     Cervical back: Normal range of motion and neck supple.     Comments: Nodular growth medial joint, palmar surface of the third and fourth fingers of the left hand. These are non tender. ROM and strength of left hand is intact.   Lymphadenopathy:     Cervical: No cervical adenopathy.  Skin:    General: Skin is warm and dry.  Neurological:     Mental Status: He is alert and oriented to person, place, and time. Mental status is at baseline.     Cranial Nerves: No cranial nerve deficit.  Psychiatric:  Mood and Affect: Mood normal.        Behavior: Behavior normal.        Thought Content: Thought content normal.        Judgment: Judgment normal.    Assessment/Plan: 1. Type 2 diabetes mellitus with hyperglycemia, without long-term current use of insulin (HCC) - POCT HgB A1C 7.2 today. Continue with diabetic medication as prescribed   2. Essential hypertension Stable. Continue bp medication as prescribed   3. Mixed hyperlipidemia Continue statin. Check routine, fasting labs prior to next visit and will adjust medications as prescribed   4. Primary generalized (osteo)arthritis Continue to take tramadol as needed and as prescribed. No new prescription needed today.   General Counseling: [name] verbalizes understanding of the findings of todays visit and agrees with plan of treatment. I have discussed any further diagnostic evaluation that may be needed or ordered today. We also reviewed his medications today. he has been encouraged to call the office with any questions or concerns that should arise related to todays visit.  Diabetes Counseling:  1. Addition of ACE inh/ ARB'S for nephroprotection. Microalbumin is updated  2. Diabetic foot care, prevention of complications. Podiatry consult 3. Exercise and lose weight.  4. Diabetic eye examination, Diabetic eye exam is updated  5. Monitor blood sugar closlely. nutrition counseling.  6. Sign and symptoms of hypoglycemia including shaking sweating,confusion and headaches.   This patient was seen by [place] with Dr [name] as a part of collaborative care agreement  Orders Placed This Encounter  Procedures   POCT HgB A1C     Total time spent: 30 Minutes   Time spent includes review of chart, medications, test results, and follow up plan with the patient.      Dr [name] Internal medicine

## 2020-03-24 ENCOUNTER — Other Ambulatory Visit: Payer: Self-pay

## 2020-03-24 DIAGNOSIS — N3281 Overactive bladder: Secondary | ICD-10-CM

## 2020-03-24 MED ORDER — OXYBUTYNIN CHLORIDE 5 MG PO TABS: 5.0000 mg | ORAL_TABLET | Freq: Two times a day (BID) | ORAL | 0 refills | Status: DC

## 2020-04-02 ENCOUNTER — Other Ambulatory Visit: Payer: Self-pay | Admitting: Nurse Practitioner

## 2020-04-02 DIAGNOSIS — M19011 Primary osteoarthritis, right shoulder: Secondary | ICD-10-CM

## 2020-04-02 MED ORDER — TRAMADOL HCL 50 MG PO TABS: ORAL_TABLET | ORAL | 2 refills | Status: DC

## 2020-04-02 NOTE — Progress Notes (Signed)
Renewed prescription for tramadol per pharmacy request.

## 2020-04-15 ENCOUNTER — Other Ambulatory Visit: Payer: Self-pay

## 2020-04-15 MED ORDER — GABAPENTIN 300 MG PO CAPS: 300.0000 mg | ORAL_CAPSULE | Freq: Three times a day (TID) | ORAL | 1 refills | Status: DC

## 2020-04-21 ENCOUNTER — Other Ambulatory Visit: Payer: Self-pay

## 2020-04-21 DIAGNOSIS — N3281 Overactive bladder: Secondary | ICD-10-CM

## 2020-04-21 MED ORDER — OXYBUTYNIN CHLORIDE 5 MG PO TABS: 5.0000 mg | ORAL_TABLET | Freq: Two times a day (BID) | ORAL | 0 refills | Status: DC

## 2020-05-05 ENCOUNTER — Other Ambulatory Visit: Payer: Self-pay

## 2020-05-05 DIAGNOSIS — E1165 Type 2 diabetes mellitus with hyperglycemia: Secondary | ICD-10-CM

## 2020-05-05 MED ORDER — GLYBURIDE 5 MG PO TABS: 5.0000 mg | ORAL_TABLET | Freq: Two times a day (BID) | ORAL | 3 refills | Status: DC

## 2020-05-13 DIAGNOSIS — I10 Essential (primary) hypertension: Secondary | ICD-10-CM | POA: Diagnosis not present

## 2020-05-13 DIAGNOSIS — I428 Other cardiomyopathies: Secondary | ICD-10-CM | POA: Diagnosis not present

## 2020-05-13 DIAGNOSIS — E785 Hyperlipidemia, unspecified: Secondary | ICD-10-CM | POA: Diagnosis not present

## 2020-05-13 DIAGNOSIS — I471 Supraventricular tachycardia: Secondary | ICD-10-CM | POA: Diagnosis not present

## 2020-05-19 ENCOUNTER — Other Ambulatory Visit (INDEPENDENT_AMBULATORY_CARE_PROVIDER_SITE_OTHER): Payer: Self-pay | Admitting: Vascular Surgery

## 2020-05-19 DIAGNOSIS — I739 Peripheral vascular disease, unspecified: Secondary | ICD-10-CM

## 2020-05-20 ENCOUNTER — Encounter (INDEPENDENT_AMBULATORY_CARE_PROVIDER_SITE_OTHER): Payer: Self-pay | Admitting: Vascular Surgery

## 2020-05-20 ENCOUNTER — Other Ambulatory Visit: Payer: Self-pay

## 2020-05-20 ENCOUNTER — Ambulatory Visit (INDEPENDENT_AMBULATORY_CARE_PROVIDER_SITE_OTHER): Payer: Medicare Other

## 2020-05-20 ENCOUNTER — Ambulatory Visit (INDEPENDENT_AMBULATORY_CARE_PROVIDER_SITE_OTHER): Payer: Medicare Other | Admitting: Vascular Surgery

## 2020-05-20 VITALS — BP 123/67 | HR 65 | Resp 16 | Wt 181.0 lb

## 2020-05-20 DIAGNOSIS — I6523 Occlusion and stenosis of bilateral carotid arteries: Secondary | ICD-10-CM

## 2020-05-20 DIAGNOSIS — E1165 Type 2 diabetes mellitus with hyperglycemia: Secondary | ICD-10-CM | POA: Diagnosis not present

## 2020-05-20 DIAGNOSIS — I10 Essential (primary) hypertension: Secondary | ICD-10-CM | POA: Diagnosis not present

## 2020-05-20 DIAGNOSIS — E782 Mixed hyperlipidemia: Secondary | ICD-10-CM | POA: Diagnosis not present

## 2020-05-20 DIAGNOSIS — I739 Peripheral vascular disease, unspecified: Secondary | ICD-10-CM

## 2020-05-20 NOTE — Assessment & Plan Note (Signed)
3 years status post right carotid endarterectomy.  Carotid duplex today shows a widely patent right carotid endarterectomy with mild disease on the left in the 1 to 39% range unchanged from his study last year.  Overall doing well.  No change in medical regimen.  Recheck in 1 year

## 2020-05-20 NOTE — Patient Instructions (Signed)
Peripheral Vascular Disease  Peripheral vascular disease (PVD) is a disease of the blood vessels that are not part of your heart and brain. A simple term for PVD is poor circulation. In most cases, PVD narrows the blood vessels that carry blood from your heart to the rest of your body. This can reduce the supply of blood to your arms, legs, and internal organs, like your stomach or kidneys. However, PVD most often affects a person's lower legs and feet. Without treatment, PVD tends to get worse. PVD can also lead to acute ischemic limb. This is when an arm or leg suddenly cannot get enough blood. This is a medical emergency. Follow these instructions at home: Lifestyle  Do not use any products that contain nicotine or tobacco, such as cigarettes and e-cigarettes. If you need help quitting, ask your doctor.  Lose weight if you are overweight. Or, stay at a healthy weight as told by your doctor.  Eat a diet that is low in fat and cholesterol. If you need help, ask your doctor.  Exercise regularly. Ask your doctor for activities that are right for you. General instructions  Take over-the-counter and prescription medicines only as told by your doctor.  Take good care of your feet: ? Wear comfortable shoes that fit well. ? Check your feet often for any cuts or sores.  Keep all follow-up visits as told by your doctor This is important. Contact a doctor if:  You have cramps in your legs when you walk.  You have leg pain when you are at rest.  You have coldness in a leg or foot.  Your skin changes.  You are unable to get or have an erection (erectile dysfunction).  You have cuts or sores on your feet that do not heal. Get help right away if:  Your arm or leg turns cold, numb, and blue.  Your arms or legs become red, warm, swollen, painful, or numb.  You have chest pain.  You have trouble breathing.  You suddenly have weakness in your face, arm, or leg.  You become very  confused or you cannot speak.  You suddenly have a very bad headache.  You suddenly cannot see. Summary  Peripheral vascular disease (PVD) is a disease of the blood vessels.  A simple term for PVD is poor circulation. Without treatment, PVD tends to get worse.  Treatment may include exercise, low fat and low cholesterol diet, and quitting smoking. This information is not intended to replace advice given to you by your health care provider. Make sure you discuss any questions you have with your health care provider. Document Revised: 08/26/2017 Document Reviewed: 10/21/2016 Elsevier Patient Education  2020 [employer]  

## 2020-05-20 NOTE — Progress Notes (Signed)
MRN : [id number]  ALEE [name] is a 69 y.o. (1950/10/31) male who presents with chief complaint of No chief complaint on file. Marland Kitchen  History of Present Illness: Patient returns in follow-up of multiple vascular issues.  He is doing well today.  About a year and a half ago, he underwent extensive left lower extremity revascularization for limb salvage.  He has done well from this.  He says he can tell a big difference in his legs.  He has only mild pain with activity at this point.  His ABIs today are 0.88 on the right and 0.89 on the left with biphasic waveforms distally.  His digital pressure is reduced on the right but is normal on the left. He is also followed for carotid disease.  He is about 3 years status post right carotid endarterectomy.  He is doing well with no current focal neurologic symptoms. Specifically, the patient denies amaurosis fugax, speech or swallowing difficulties, or arm or leg weakness or numbness. Carotid duplex today shows a widely patent right carotid endarterectomy with mild disease on the left in the 1 to 39% range unchanged from his study last year.  Current Outpatient Medications  Medication Sig Dispense Refill  . aspirin EC 81 MG tablet Take 81 mg by mouth daily.    . Calcium Carbonate-Vitamin D (CALCIUM PLUS VITAMIN D PO) Take 1 tablet by mouth daily.    . clopidogrel (PLAVIX) 75 MG tablet TAKE 1 TABLET BY MOUTH ONCE DAILY 90 tablet 3  . gabapentin (NEURONTIN) 300 MG capsule Take 1 capsule (300 mg total) by mouth 3 (three) times daily. 90 capsule 1  . glucose blood (ACCU-CHEK GUIDE) test strip Use as instructed 100 each 12  . glyBURIDE (DIABETA) 5 MG tablet Take 1 tablet (5 mg total) by mouth 2 (two) times daily with a meal. 60 tablet 3  . Lancets (ACCU-CHEK SOFT TOUCH) lancets Use as instructed 100 each 12  . lovastatin (MEVACOR) 10 MG tablet Take 1 tab  Po QHS 90 tablet 1  . meloxicam (MOBIC) 15 MG tablet Take by mouth.    . metoprolol succinate (TOPROL-XL)  100 MG 24 hr tablet Take 1 tablet (100 mg total) by mouth daily. Take with or immediately following a meal. 30 tablet 0  . mometasone-formoterol (DULERA) 100-5 MCG/ACT AERO Inhale 2 puffs into the lungs 2 (two) times daily as needed for wheezing.    Mable Fill (ALLERGY EYE OP) Place 1 drop into both eyes daily as needed (for allergies).    . nystatin (MYCOSTATIN) 100000 UNIT/ML suspension Take 5 mLs (500,000 Units total) by mouth 4 (four) times daily. 200 mL 1  . Omega-3 Fatty Acids (FISH OIL PO) Take 1 capsule by mouth daily.    Marland Kitchen oxybutynin (DITROPAN) 5 MG tablet Take 1 tablet (5 mg total) by mouth 2 (two) times daily. 180 tablet 0  . traMADol (ULTRAM) 50 MG tablet TAKE 1 TABLET BY MOUTH EVERY 12 HOURS ASNEEDED FOR PAIN 45 tablet 2  . vitamin B-12 (CYANOCOBALAMIN) 500 MCG tablet Take 500 mcg by mouth daily.      No current facility-administered medications for this visit.    Past Medical History:  Diagnosis Date  . Cholecystitis, chronic   . Cholelithiasis   . COPD (chronic obstructive pulmonary disease) ([place])   . Diabetes mellitus without complication ([place])   . Diabetic retinopathy ([place])   . Dyspnea   . Fatty liver 2008  . Fracture of clavicle 1992   left   .  Fracture of ribs, multiple 1992   3 ribs  . GERD (gastroesophageal reflux disease)   . Hyperlipidemia   . Hypertension   . Paroxysmal SVT (supraventricular tachycardia) ([place])   . Pelvis fracture ([place]) 1992  . Peripheral vascular disease ([place])   . Pleurisy   . Pleurisy   . Seasonal allergies     Past Surgical History:  Procedure Laterality Date  . AMPUTATION TOE Left 02/25/2017   Procedure: AMPUTATION TOE-LEFT 2ND MPJ;  Surgeon: Samara Deist, DPM;  Location: [facility] ORS;  Service: Podiatry;  Laterality: Left;  . AMPUTATION TOE Left 10/20/2018   Procedure: TOE MPJ T2 LEFT;  Surgeon: Samara Deist, DPM;  Location: [facility] OR[facility];  Service: Podiatry;  Laterality: Left;  . AMPUTATION TOE Left 01/12/2019   Procedure:  AMPUTATION LEFT 5TH TOE AND JOINT;  Surgeon: Samara Deist, DPM;  Location: [facility] OR[facility];  Service: Podiatry;  Laterality: Left;  . BACK SURGERY  2008  . CHOLECYSTECTOMY    . COLONOSCOPY WITH PROPOFOL N/A 08/15/2017   Procedure: COLONOSCOPY WITH PROPOFOL;  Surgeon: Lollie Sails, MD;  Location: [facility] ENDOSCOPY;  Service: Endoscopy;  Laterality: N/A;  . ENDARTERECTOMY Right 05/12/2017   Procedure: ENDARTERECTOMY CAROTID;  Surgeon: [name], MD;  Location: [facility] ORS;  Service: Vascular;  Laterality: Right;  . ENDARTERECTOMY FEMORAL Left 12/01/2018   Procedure: ENDARTERECTOMY FEMORAL;  Surgeon: [name], MD;  Location: [facility] ORS;  Service: Vascular;  Laterality: Left;  . ERCP    . FOOT SURGERY Right    x 3  . LOWER EXTREMITY ANGIOGRAM Left 12/01/2018   Procedure: LOWER EXTREMITY ANGIOGRAM;  Surgeon: [name], MD;  Location: [facility] ORS;  Service: Vascular;  Laterality: Left;  . LOWER EXTREMITY ANGIOGRAPHY Left 10/30/2018   Procedure: LOWER EXTREMITY ANGIOGRAPHY;  Surgeon: [name], MD;  Location: [place] [facility] LAB;  Service: Cardiovascular;  Laterality: Left;  . LOWER EXTREMITY ANGIOGRAPHY Left 11/30/2018   Procedure: LOWER EXTREMITY ANGIOGRAPHY;  Surgeon: [name], MD;  Location: [place] [facility];  Service: Cardiovascular;  Laterality: Left;      Social History        Tobacco Use  . Smoking status: Former Smoker    Last attempt to quit: 02/24/1997    Years since quitting: 20.5  . Smokeless tobacco: Never Used  Substance Use Topics  . Alcohol use: No  . Drug use: No     Family History      Family History  Problem Relation Age of Onset  . Diabetes Sister   no bleeding disorders, clotting disorders, or aneurysms        Allergies  Allergen Reactions  . Ampicillin   . Bactrim [Sulfamethoxazole-Trimethoprim] Other (See Comments)    Mouth sores Sores develop in mouth  . Amoxicillin Rash    Sores in my mouth  . Penicillins Rash     Rash in and around the mouth Has patient had a PCN reaction causing immediate rash, facial/tongue/throat swelling, SOB or lightheadedness with hypotension: Yes Has patient had a PCN reaction causing severe rash involving mucus membranes or skin necrosis: Yes Has patient had a PCN reaction that required hospitalization: No Has patient had a PCN reaction occurring within the last 10 years: Yes If all of the above answers are "NO", then may proceed with Cephalosporin use.       REVIEW OF SYSTEMS(Negative unless checked)  Constitutional: [] ?????Weight loss[] ?????Fever[] ?????Chills Cardiac:[] ?????Chest pain[] ?????Chest pressure[x] ?????Palpitations [] ?????Shortness of breath when laying flat [] ?????Shortness of breath at rest [] ?????Shortness of  breath with exertion. Vascular: [] ?????Pain in legs with walking[] ?????Pain in legsat rest[] ?????Pain in legs when laying flat [] ?????Claudication [] ?????Pain in feet when walking [] ?????Pain in feet at rest [] ?????Pain in feet when laying flat [] ?????History of DVT [] ?????Phlebitis [] ?????Swelling in legs [] ?????Varicose veins [x] ?????Non-healing ulcers Pulmonary: [] ?????Uses home oxygen [] ?????Productive cough[] ?????Hemoptysis [] ?????Wheeze [] ?????COPD [] ?????Asthma Neurologic: [] ?????Dizziness [] ?????Blackouts [] ?????Seizures [] ?????History of stroke [] ?????History of TIA[] ?????Aphasia [] ?????Temporary blindness[] ?????Dysphagia [] ?????Weaknessor numbness in arms [x] ?????Weakness or numbnessin legs Musculoskeletal: [x] ?????Arthritis [] ?????Joint swelling [] ?????Joint pain [] ?????Low back pain Hematologic:[] ?????Easy bruising[] ?????Easy bleeding [] ?????Hypercoagulable state [] ?????Anemic [] ?????Hepatitis Gastrointestinal:[] ?????Blood in stool[] ?????Vomiting blood[] ?????Gastroesophageal reflux/heartburn[] ?????Difficulty swallowing. Genitourinary:  [] ?????Chronic kidney disease [] ?????Difficulturination  [] ?????Frequenturination [] ?????Burning with urination[] ?????Blood in urine Skin: [] ?????Rashes [x] ?????Ulcers [x] ?????Wounds Psychological: [] ?????History of anxiety[] ?????History of major depression.     Physical Examination  There were no vitals filed for this visit. There is no height or weight on file to calculate BMI. Gen:  WD/WN, NAD Head: [place]/AT, No temporalis wasting. Ear/Nose/Throat: Hearing grossly intact, nares w/o erythema or drainage, trachea midline Eyes: Conjunctiva clear. Sclera non-icteric Neck: Supple.  No bruit  Pulmonary:  Good air movement, equal and clear to auscultation bilaterally.  Cardiac: RRR, No JVD Vascular:  Vessel Right Left  Radial Palpable Palpable                          PT  1+ palpable  1+ palpable  DP  1+ palpable Palpable   Gastrointestinal: soft, non-tender/non-distended. No guarding/reflex.  Musculoskeletal: M/S 5/5 throughout.  No deformity or atrophy.  Trace lower extremity edema. Neurologic: CN 2-12 intact. Sensation grossly intact in extremities.  Symmetrical.  Speech is fluent. Motor exam as listed above. Psychiatric: Judgment intact, Mood & affect appropriate for pt's clinical situation. Dermatologic: No rashes or ulcers noted.  No cellulitis or open wounds.      CBC Lab Results  Component Value Date   WBC 4.2 01/11/2019   HGB 11.7 (L) 01/11/2019   HCT 36.7 (L) 01/11/2019   MCV 87.6 01/11/2019   PLT 143 (L) 01/11/2019    BMET    Component Value Date/Time   NA 136 12/02/2018 0603   NA 136 04/17/2014 1026   K 4.7 12/02/2018 0603   K 5.1 04/17/2014 1026   CL 104 12/02/2018 0603   CL 103 04/17/2014 1026   CO2 25 12/02/2018 0603   CO2 28 04/17/2014 1026   GLUCOSE 168 (H) 12/02/2018 0603   GLUCOSE 124 (H) 04/17/2014 1026   BUN 23 12/02/2018 0603   BUN 28 (H) 04/17/2014 1026   CREATININE 1.02 12/02/2018 0603   CREATININE 1.30  04/17/2014 1026   CALCIUM 8.0 (L) 12/02/2018 0603   CALCIUM 8.6 04/17/2014 1026   GFRNONAA >60 12/02/2018 0603   GFRNONAA 59 (L) 04/17/2014 1026   GFRAA >60 12/02/2018 0603   GFRAA >60 04/17/2014 1026   CrCl cannot be calculated (Patient's most recent lab result is older than the maximum 21 days allowed.).  COAG Lab Results  Component Value Date   INR 1.0 12/01/2018   INR 0.97 05/05/2017    Radiology No results found.   Assessment/Plan Hyperlipidemia lipid control important in reducing the progression of atherosclerotic disease. Continue statin therapy   Type 2 diabetes mellitus with complication (HCC) blood glucose control important in reducing the progression of atherosclerotic disease. Also, involved in wound healing. On appropriate medications.  Essential hypertension blood pressure control important in reducing the progression of atherosclerotic disease. On appropriate oral medications.  No problem-specific Assessment & Plan notes found for this encounter.  Leotis Pain, MD  05/20/2020 8:34 AM    This note was created with Dragon medical transcription system.  Any errors from dictation are purely unintentional

## 2020-05-20 NOTE — Assessment & Plan Note (Signed)
His ABIs today are 0.88 on the right and 0.89 on the left with biphasic waveforms distally.  His digital pressure is reduced on the right but is normal on the left.  Doing well after extensive revascularization about a year and a half ago.  Continue current medical regimen.  We can transition out to checking this on an annual basis unless his symptoms worsen.

## 2020-06-20 ENCOUNTER — Encounter: Payer: Self-pay | Admitting: Nurse Practitioner

## 2020-06-20 ENCOUNTER — Other Ambulatory Visit: Payer: Self-pay

## 2020-06-20 ENCOUNTER — Ambulatory Visit (INDEPENDENT_AMBULATORY_CARE_PROVIDER_SITE_OTHER): Payer: Medicare Other | Admitting: Nurse Practitioner

## 2020-06-20 ENCOUNTER — Other Ambulatory Visit
Admission: RE | Admit: 2020-06-20 | Discharge: 2020-06-20 | Disposition: A | Discharge status: Home / Self Care | Payer: Medicare Other | Source: Ambulatory Visit | Attending: Nurse Practitioner | Admitting: Nurse Practitioner

## 2020-06-20 VITALS — BP 159/76 | HR 60 | Temp 97.5°F | Resp 16 | Ht 70.0 in | Wt 179.0 lb

## 2020-06-20 DIAGNOSIS — E119 Type 2 diabetes mellitus without complications: Secondary | ICD-10-CM | POA: Insufficient documentation

## 2020-06-20 DIAGNOSIS — I10 Essential (primary) hypertension: Secondary | ICD-10-CM

## 2020-06-20 DIAGNOSIS — L97909 Non-pressure chronic ulcer of unspecified part of unspecified lower leg with unspecified severity: Secondary | ICD-10-CM | POA: Diagnosis not present

## 2020-06-20 DIAGNOSIS — E782 Mixed hyperlipidemia: Secondary | ICD-10-CM | POA: Diagnosis not present

## 2020-06-20 DIAGNOSIS — Z0000 Encounter for general adult medical examination without abnormal findings: Secondary | ICD-10-CM | POA: Diagnosis not present

## 2020-06-20 DIAGNOSIS — Z125 Encounter for screening for malignant neoplasm of prostate: Secondary | ICD-10-CM | POA: Diagnosis not present

## 2020-06-20 DIAGNOSIS — E1165 Type 2 diabetes mellitus with hyperglycemia: Secondary | ICD-10-CM | POA: Diagnosis not present

## 2020-06-20 DIAGNOSIS — R300 Dysuria: Secondary | ICD-10-CM

## 2020-06-20 DIAGNOSIS — Z0001 Encounter for general adult medical examination with abnormal findings: Secondary | ICD-10-CM

## 2020-06-20 DIAGNOSIS — Z23 Encounter for immunization: Secondary | ICD-10-CM | POA: Diagnosis not present

## 2020-06-20 DIAGNOSIS — I70299 Other atherosclerosis of native arteries of extremities, unspecified extremity: Secondary | ICD-10-CM

## 2020-06-20 LAB — LIPID PANEL
Cholesterol: 110 mg/dL (ref 0–200)
HDL: 28 mg/dL — ABNORMAL LOW (ref >40)
LDL Cholesterol: 50 mg/dL (ref 0–99)
Total CHOL/HDL Ratio: 3.9 RATIO
Triglycerides: 159 mg/dL — ABNORMAL HIGH (ref <150)
VLDL: 32 mg/dL (ref 0–40)

## 2020-06-20 LAB — COMPREHENSIVE METABOLIC PANEL
ALT: 26 U/L (ref 0–44)
AST: 21 U/L (ref 15–41)
Albumin: 4.7 g/dL (ref 3.5–5.0)
Alkaline Phosphatase: 46 U/L (ref 38–126)
Anion gap: 8 (ref 5–15)
BUN: 21 mg/dL (ref 8–23)
CO2: 32 mmol/L (ref 22–32)
Calcium: 9.5 mg/dL (ref 8.9–10.3)
Chloride: 96 mmol/L — ABNORMAL LOW (ref 98–111)
Creatinine, Ser: 1.07 mg/dL (ref 0.61–1.24)
GFR calc Af Amer: >60 mL/min (ref >60)
GFR calc non Af Amer: >60 mL/min (ref >60)
Glucose, Bld: 149 mg/dL — ABNORMAL HIGH (ref 70–99)
Potassium: 4.8 mmol/L (ref 3.5–5.1)
Sodium: 136 mmol/L (ref 135–145)
Total Bilirubin: 2.5 mg/dL — ABNORMAL HIGH (ref 0.3–1.2)
Total Protein: 7.8 g/dL (ref 6.5–8.1)

## 2020-06-20 LAB — CBC
HCT: 43.6 % (ref 39.0–52.0)
Hemoglobin: 15.4 g/dL (ref 13.0–17.0)
MCH: 31 pg (ref 26.0–34.0)
MCHC: 35.3 g/dL (ref 30.0–36.0)
MCV: 87.7 fL (ref 80.0–100.0)
Platelets: 137 10*3/uL — ABNORMAL LOW (ref 150–400)
RBC: 4.97 MIL/uL (ref 4.22–5.81)
RDW: 12.6 % (ref 11.5–15.5)
WBC: 4.5 10*3/uL (ref 4.0–10.5)
nRBC: 0 % (ref 0.0–0.2)

## 2020-06-20 LAB — PSA: Prostatic Specific Antigen: 0.29 ng/mL (ref 0.00–4.00)

## 2020-06-20 LAB — T4, FREE: Free T4: 0.76 ng/dL (ref 0.61–1.12)

## 2020-06-20 LAB — TSH: TSH: 4.552 u[IU]/mL — ABNORMAL HIGH (ref 0.350–4.500)

## 2020-06-20 LAB — POCT GLYCOSYLATED HEMOGLOBIN (HGB A1C): Hemoglobin A1C: 7.4 % — AB (ref 4.0–5.6)

## 2020-06-20 MED ORDER — PNEUMOVAX 23 25 MCG/0.5ML IJ INJ: INJECTION | INTRAMUSCULAR | 0 refills | Status: DC

## 2020-06-20 NOTE — Progress Notes (Signed)
[facility] [place], [place] [address]  Internal MEDICINE  Office Visit Note  Patient Name: [name]  [id number]  [id number]  Date of Service: 06/20/2020   Pt is here for routine health maintenance examination   Chief Complaint  Patient presents with   Medicare Wellness   Diabetes   Gastroesophageal Reflux   Hyperlipidemia   Hypertension   Quality Metric Gaps    tetnaus, flu,      The patient is here for health maintenance exam. Blood sugars are stable. HgbA1c is 7.4 today. He routinely sees vein and vascular and podiatry due to diabetic ulceration and peripheral vascular disease. He does have chronic pain in both feet  He continues to take tramadol 50mg  tablets as needed to help with moderate to severe pain. He does have pain in the left shoulder. He also has bulging disk in the lumbar spine which is not operable. He has been taking this prescription for some time. Helps to relieve his pain and allows him to participate in usual activities and continue working. He has no negative side effects from taking this medication. He states that pain is most severe when he first gets up in the mornings. That is when he generally takes dose of tramadol. He often does not take second dose. He states that he does not need to get new prescription today.       Current Medication: Outpatient Encounter Medications as of 06/20/2020  Medication Sig   aspirin EC 81 MG tablet Take 81 mg by mouth daily.   Calcium Carbonate-Vitamin D (CALCIUM PLUS VITAMIN D PO) Take 1 tablet by mouth daily.   clopidogrel (PLAVIX) 75 MG tablet TAKE 1 TABLET BY MOUTH ONCE DAILY   gabapentin (NEURONTIN) 300 MG capsule Take 1 capsule (300 mg total) by mouth 3 (three) times daily.   glucose blood (ACCU-CHEK GUIDE) test strip Use as instructed   glyBURIDE (DIABETA) 5 MG tablet Take 1 tablet (5 mg total) by mouth 2 (two) times daily with a meal.   Lancets (ACCU-CHEK SOFT TOUCH)  lancets Use as instructed   lovastatin (MEVACOR) 10 MG tablet Take 1 tab  Po QHS   meloxicam (MOBIC) 15 MG tablet Take by mouth.   mometasone-formoterol (DULERA) 100-5 MCG/ACT AERO Inhale 2 puffs into the lungs 2 (two) times daily as needed for wheezing.   Naphazoline-Pheniramine (ALLERGY EYE OP) Place 1 drop into both eyes daily as needed (for allergies).   nystatin (MYCOSTATIN) 100000 UNIT/ML suspension Take 5 mLs (500,000 Units total) by mouth 4 (four) times daily.   Omega-3 Fatty Acids (FISH OIL PO) Take 1 capsule by mouth daily.   oxybutynin (DITROPAN) 5 MG tablet Take 1 tablet (5 mg total) by mouth 2 (two) times daily.   traMADol (ULTRAM) 50 MG tablet TAKE 1 TABLET BY MOUTH EVERY 12 HOURS ASNEEDED FOR PAIN   vitamin B-12 (CYANOCOBALAMIN) 500 MCG tablet Take 500 mcg by mouth daily.    pneumococcal 23 valent vaccine (PNEUMOVAX 23) 25 MCG/0.5ML injection Inject 0.76ml IM once   [DISCONTINUED] metoprolol succinate (TOPROL-XL) 100 MG 24 hr tablet Take 1 tablet (100 mg total) by mouth daily. Take with or immediately following a meal. (Patient not taking: Reported on 05/20/2020)   No facility-administered encounter medications on file as of 06/20/2020.    Surgical History: Past Surgical History:  Procedure Laterality Date   AMPUTATION TOE Left 02/25/2017   Procedure: AMPUTATION TOE-LEFT 2ND MPJ;  Surgeon: Samara Deist, DPM;  Location: [facility];  Service: Podiatry;  Laterality: Left;   AMPUTATION TOE Left 10/20/2018   Procedure: TOE MPJ T2 LEFT;  Surgeon: Samara Deist, DPM;  Location: [facility];  Service: Podiatry;  Laterality: Left;   AMPUTATION TOE Left 01/12/2019   Procedure: AMPUTATION LEFT 5TH TOE AND JOINT;  Surgeon: Samara Deist, DPM;  Location: [facility];  Service: Podiatry;  Laterality: Left;   BACK SURGERY  2008   CHOLECYSTECTOMY     COLONOSCOPY WITH PROPOFOL N/A 08/15/2017   Procedure: COLONOSCOPY WITH PROPOFOL;  Surgeon: Lollie Sails, MD;  Location: [facility]  ENDOSCOPY;  Service: Endoscopy;  Laterality: N/A;   ENDARTERECTOMY Right 05/12/2017   Procedure: ENDARTERECTOMY CAROTID;  Surgeon: [name], MD;  Location: [facility];  Service: Vascular;  Laterality: Right;   ENDARTERECTOMY FEMORAL Left 12/01/2018   Procedure: ENDARTERECTOMY FEMORAL;  Surgeon: [name], MD;  Location: [facility];  Service: Vascular;  Laterality: Left;   ERCP     FOOT SURGERY Right    x 3   LOWER EXTREMITY ANGIOGRAM Left 12/01/2018   Procedure: LOWER EXTREMITY ANGIOGRAM;  Surgeon: [name], MD;  Location: [facility];  Service: Vascular;  Laterality: Left;   LOWER EXTREMITY ANGIOGRAPHY Left 10/30/2018   Procedure: LOWER EXTREMITY ANGIOGRAPHY;  Surgeon: [name], MD;  Location: [facility];  Service: Cardiovascular;  Laterality: Left;   LOWER EXTREMITY ANGIOGRAPHY Left 11/30/2018   Procedure: LOWER EXTREMITY ANGIOGRAPHY;  Surgeon: [name], MD;  Location: [facility];  Service: Cardiovascular;  Laterality: Left;    Medical History: Past Medical History:  Diagnosis Date   Cholecystitis, chronic    Cholelithiasis    COPD (chronic obstructive pulmonary disease) ([place])    Diabetes mellitus without complication ([place])    Diabetic retinopathy ([place])    Dyspnea    Fatty liver 2008   Fracture of clavicle 1992   left    Fracture of ribs, multiple 1992   3 ribs   GERD (gastroesophageal reflux disease)    Hyperlipidemia    Hypertension    Paroxysmal SVT (supraventricular tachycardia) (HCC)    Pelvis fracture ([place]) 1992   Peripheral vascular disease ([place])    Pleurisy    Pleurisy    Seasonal allergies     Family History: Family History  Problem Relation Age of Onset   Diabetes Sister       Review of Systems  Constitutional: Negative for activity change, chills, fatigue, fever and unexpected weight change.  HENT: Negative for congestion, postnasal drip, rhinorrhea, sinus pressure, sneezing and sore throat.    Respiratory: Positive for cough and wheezing. Negative for chest tightness and shortness of breath.        Intermittent and generally well controlled.   Cardiovascular: Negative for chest pain and palpitations.  Gastrointestinal: Negative for abdominal distention, abdominal pain, anal bleeding, constipation, diarrhea, nausea and vomiting.  Endocrine: Negative for cold intolerance, heat intolerance and polyuria.       Stable. Blood sugars   Genitourinary: Negative for dysuria, frequency, hematuria and urgency.  Musculoskeletal: Positive for arthralgias, back pain and myalgias. Negative for joint swelling and neck pain.       Chronic, bilateral shoulder pain. Intermittent and made worse with exertion. Nodular growth on inner aspect of the middle and ring fingers of the left hand.   Skin: Negative for color change, pallor, rash and wound.  Allergic/Immunologic: Positive for environmental allergies.  Neurological: Negative for dizziness, tremors, numbness and headaches.  Hematological: Negative for adenopathy. Does not  bruise/bleed easily.  Psychiatric/Behavioral: Negative for behavioral problems (Depression), sleep disturbance and suicidal ideas. The patient is not nervous/anxious.     Today's Vitals   06/20/20 0831  BP: (!) 159/76  Pulse: 60  Resp: 16  Temp: (!) 97.5 F (36.4 C)  SpO2: 97%  Weight: 179 lb (81.2 kg)  Height: 5\' 10"  (1.778 m)   Body mass index is 25.68 kg/m.  Physical Exam Vitals and nursing note reviewed.  Constitutional:      General: He is not in acute distress.    Appearance: Normal appearance. He is well-developed. He is not diaphoretic.  HENT:     Head: Normocephalic and atraumatic.     Nose: Nose normal.     Mouth/Throat:     Pharynx: No oropharyngeal exudate.  Eyes:     Pupils: Pupils are equal, round, and reactive to light.  Neck:     Thyroid: No thyromegaly.     Vascular: Carotid bruit present. No JVD.     Trachea: No tracheal deviation.      Comments: Left carotid bruit Cardiovascular:     Rate and Rhythm: Normal rate and regular rhythm.     Heart sounds: Normal heart sounds. No murmur heard.  No friction rub. No gallop.   Pulmonary:     Effort: Pulmonary effort is normal. No respiratory distress.     Breath sounds: No wheezing or rales.     Comments: Congested breath sounds which clear with cough.  Chest:     Chest wall: No tenderness.  Abdominal:     General: Bowel sounds are normal.     Palpations: Abdomen is soft.     Tenderness: There is no abdominal tenderness.  Musculoskeletal:        General: Normal range of motion.     Cervical back: Normal range of motion and neck supple.     Right foot: Normal range of motion. Deformity and bunion present.     Left foot: Normal range of motion. Deformity and bunion present.     Comments: Mild to moderate lower back pain which is worse with bending and twisting at the waist. No visible or palpable abnormalities at this time.      Left Lower Extremity: (amputated digits from left foot. ) Feet:     Right foot:     Protective Sensation: 10 sites tested. 10 sites sensed.     Left foot:     Protective Sensation: 10 sites tested. 10 sites sensed.     Comments: Decreased pedal pulses, bilaterally. Blu/purple discoloration of the skin of the right lower extremity and foot.  Lymphadenopathy:     Cervical: No cervical adenopathy.  Skin:    General: Skin is warm and dry.     Capillary Refill: Capillary refill takes 2 to 3 seconds.  Neurological:     General: No focal deficit present.     Mental Status: He is alert and oriented to person, place, and time.     Cranial Nerves: No cranial nerve deficit.  Psychiatric:        Mood and Affect: Mood normal.        Behavior: Behavior normal.        Thought Content: Thought content normal.        Judgment: Judgment normal.    Depression screen [facility] 2/9 06/20/2020 03/20/2020 12/17/2019 09/17/2019 06/15/2019  Decreased Interest 0 0 0 0 0   Down, Depressed, Hopeless 0 0 0 0 0  PHQ - 2 Score  0 0 0 0 0    Functional Status Survey: Is the patient deaf or have difficulty hearing?: No Does the patient have difficulty seeing, even when wearing glasses/contacts?: No Does the patient have difficulty concentrating, remembering, or making decisions?: No Does the patient have difficulty walking or climbing stairs?: Yes Does the patient have difficulty dressing or bathing?: No Does the patient have difficulty doing errands alone such as visiting a doctor's office or shopping?: No  MMSE - [place] Exam 06/20/2020 06/15/2019  Orientation to time 5 5  Orientation to Place 5 5  Registration 3 3  Attention/ Calculation 5 5  Recall 3 3  Language- name 2 objects 2 2  Language- repeat 1 1  Language- follow 3 step command 3 3  Language- read & follow direction 1 1  Write a sentence 1 1  Copy design 1 1  Total score 30 30    Fall Risk  06/20/2020 03/20/2020 12/17/2019 09/17/2019 06/15/2019  Falls in the past year? 0 0 0 0 0  Number falls in past yr: - - - 0 -  Injury with Fall? - - - 0 -      LABS: Recent Results (from the past 2160 hour(s))  POCT HgB A1C     Status: Abnormal   Collection Time: 06/20/20  8:51 AM  Result Value Ref Range   Hemoglobin A1C 7.4 (A) 4.0 - 5.6 %   HbA1c POC (<> result, manual entry)     HbA1c, POC (prediabetic range)     HbA1c, POC (controlled diabetic range)    CBC     Status: Abnormal   Collection Time: 06/20/20 10:01 AM  Result Value Ref Range   WBC 4.5 4.0 - 10.5 K/uL   RBC 4.97 4.22 - 5.81 MIL/uL   Hemoglobin 15.4 13.0 - 17.0 g/dL   HCT 43.6 39 - 52 %   MCV 87.7 80.0 - 100.0 fL   MCH 31.0 26.0 - 34.0 pg   MCHC 35.3 30.0 - 36.0 g/dL   RDW 12.6 11.5 - 15.5 %   Platelets 137 (L) 150 - 400 K/uL    Comment: Immature Platelet Fraction may be clinically indicated, consider ordering this additional test [id number]    nRBC 0.0 0.0 - 0.2 %    Comment: Performed at [facility],  [address] [place], [place] [address]  Comprehensive metabolic panel     Status: Abnormal   Collection Time: 06/20/20 10:01 AM  Result Value Ref Range   Sodium 136 135 - 145 mmol/L   Potassium 4.8 3.5 - 5.1 mmol/L   Chloride 96 (L) 98 - 111 mmol/L   CO2 32 22 - 32 mmol/L   Glucose, Bld 149 (H) 70 - 99 mg/dL    Comment: Glucose reference range applies only to samples taken after fasting for at least 8 hours.   BUN 21 8 - 23 mg/dL   Creatinine, Ser 1.07 0.61 - 1.24 mg/dL   Calcium 9.5 8.9 - 10.3 mg/dL   Total Protein 7.8 6.5 - 8.1 g/dL   Albumin 4.7 3.5 - 5.0 g/dL   AST 21 15 - 41 U/L   ALT 26 0 - 44 U/L   Alkaline Phosphatase 46 38 - 126 U/L   Total Bilirubin 2.5 (H) 0.3 - 1.2 mg/dL   GFR calc non Af Amer >60 >60 mL/min   GFR calc Af Amer >60 >60 mL/min   Anion gap 8 5 - 15    Comment: Performed  at [facility], [address] [place], [place] [address]  Lipid panel     Status: Abnormal   Collection Time: 06/20/20 10:01 AM  Result Value Ref Range   Cholesterol 110 0 - 200 mg/dL   Triglycerides 159 (H) <150 mg/dL   HDL 28 (L) >40 mg/dL   Total CHOL/HDL Ratio 3.9 RATIO   VLDL 32 0 - 40 mg/dL   LDL Cholesterol 50 0 - 99 mg/dL    Comment:        Total Cholesterol/HDL:CHD Risk Coronary Heart Disease Risk Table                     Men   Women  1/2 Average Risk   3.4   3.3  Average Risk       5.0   4.4  2 X Average Risk   9.6   7.1  3 X Average Risk  23.4   11.0        Use the calculated Patient Ratio above and the CHD Risk Table to determine the patient's CHD Risk.        ATP III CLASSIFICATION (LDL):  <100     mg/dL   Optimal  100-129  mg/dL   Near or Above                    Optimal  130-159  mg/dL   Borderline  160-189  mg/dL   High  >190     mg/dL   Very High Performed at [facility], [address] [place], [place] [address]   TSH     Status: Abnormal   Collection Time: 06/20/20 10:01 AM  Result Value Ref Range   TSH 4.552 (H) 0.350  - 4.500 uIU/mL    Comment: Performed by a 3rd Generation assay with a functional sensitivity of <=0.01 uIU/mL. Performed at [facility], [address] [place], [place] [address]   T4, free     Status: None   Collection Time: 06/20/20 10:01 AM  Result Value Ref Range   Free T4 0.76 0.61 - 1.12 ng/dL    Comment: (NOTE) Biotin ingestion may interfere with free T4 tests. If the results are inconsistent with the TSH level, previous test results, or the clinical presentation, then consider biotin interference. If needed, order repeat testing after stopping biotin. Performed at [facility], [address] [place], [place] [address]    Assessment/Plan:  1. Encounter for general adult medical examination with abnormal findings Annual health maintenance exam today. Order slip given for patient to have routine, fasting labs drawn following visit.   2. Type 2 diabetes mellitus with hyperglycemia, without long-term current use of insulin (HCC) - POCT HgB A1C 7.6 today. No medication changes made today. Patient should limit intake of carbohydrates and sweets and monitor his blood sugar closely.   3. Atherosclerosis of artery of extremity with ulceration ([place]) Continue regular visits with vein and vascular provider as scheduled. Continue plavix and lovastatin as prescribed   4. Essential hypertension Generally stable. Continue bp medication as prescribed   5. Mixed hyperlipidemia Check fasting lipid panel. Adjust lovastatin as indicated   6. Need for vaccination against Streptococcus pneumoniae using pneumococcal conjugate vaccine 13 Prescription for prevnar 13 vaccine sent to his pharmacy for administration.  - pneumococcal 23 valent vaccine (PNEUMOVAX 23) 25 MCG/0.5ML injection; Inject 0.82ml IM once  Dispense: 0.5 mL; Refill: 0  7. Dysuria - UA/M w/rflx Culture, Routine  General Counseling: Williams verbalizes understanding of the findings of todays visit and agrees  with plan of treatment. I have discussed any further diagnostic evaluation that may be needed or ordered today. We also reviewed his medications today. he has been encouraged to call the office with any questions or concerns that should arise related to todays visit.    Counseling:  Diabetes Counseling:  1. Addition of ACE inh/ ARB'S for nephroprotection. Microalbumin is updated  2. Diabetic foot care, prevention of complications. Podiatry consult 3. Exercise and lose weight.  4. Diabetic eye examination, Diabetic eye exam is updated  5. Monitor blood sugar closlely. nutrition counseling.  6. Sign and symptoms of hypoglycemia including shaking sweating,confusion and headaches.  This patient was seen by [place] with Dr [name] as a part of collaborative care agreement  Orders Placed This Encounter  Procedures   UA/M w/rflx Culture, Routine   POCT HgB A1C    Meds ordered this encounter  Medications   pneumococcal 23 valent vaccine (PNEUMOVAX 23) 25 MCG/0.5ML injection    Sig: Inject 0.4ml IM once    Dispense:  0.5 mL    Refill:  0    Order Specific Question:   Supervising Provider    Answer:   [name] [[id number]]    Total time spent: 68 Minutes  Time spent includes review of chart, medications, test results, and follow up plan with the patient.     [name], MD  Internal Medicine

## 2020-06-21 LAB — UA/M W/RFLX CULTURE, ROUTINE
Bilirubin, UA: NEGATIVE
Glucose, UA: NEGATIVE
Ketones, UA: NEGATIVE
Leukocytes,UA: NEGATIVE
Nitrite, UA: NEGATIVE
Protein,UA: NEGATIVE
RBC, UA: NEGATIVE
Specific Gravity, UA: 1.012 (ref 1.005–1.030)
Urobilinogen, Ur: 0.2 mg/dL (ref 0.2–1.0)
pH, UA: 5.5 (ref 5.0–7.5)

## 2020-06-21 LAB — MICROSCOPIC EXAMINATION
Bacteria, UA: NONE SEEN
Casts: NONE SEEN /lpf
Epithelial Cells (non renal): NONE SEEN /hpf (ref 0–10)
RBC, Urine: NONE SEEN /hpf (ref 0–2)
WBC, UA: NONE SEEN /hpf (ref 0–5)

## 2020-06-23 DIAGNOSIS — L97512 Non-pressure chronic ulcer of other part of right foot with fat layer exposed: Secondary | ICD-10-CM | POA: Diagnosis not present

## 2020-06-23 DIAGNOSIS — E1142 Type 2 diabetes mellitus with diabetic polyneuropathy: Secondary | ICD-10-CM | POA: Diagnosis not present

## 2020-06-23 DIAGNOSIS — I739 Peripheral vascular disease, unspecified: Secondary | ICD-10-CM | POA: Diagnosis not present

## 2020-06-23 DIAGNOSIS — L97521 Non-pressure chronic ulcer of other part of left foot limited to breakdown of skin: Secondary | ICD-10-CM | POA: Diagnosis not present

## 2020-06-23 DIAGNOSIS — B351 Tinea unguium: Secondary | ICD-10-CM | POA: Diagnosis not present

## 2020-06-23 DIAGNOSIS — Z89422 Acquired absence of other left toe(s): Secondary | ICD-10-CM | POA: Diagnosis not present

## 2020-07-01 ENCOUNTER — Ambulatory Visit (INDEPENDENT_AMBULATORY_CARE_PROVIDER_SITE_OTHER): Payer: Medicare Other | Admitting: Vascular Surgery

## 2020-07-01 ENCOUNTER — Encounter (INDEPENDENT_AMBULATORY_CARE_PROVIDER_SITE_OTHER): Payer: Medicare Other

## 2020-07-01 DIAGNOSIS — Z23 Encounter for immunization: Secondary | ICD-10-CM | POA: Diagnosis not present

## 2020-07-06 NOTE — Progress Notes (Signed)
Subclinical hypothyroid. Continue to monitor closely.

## 2020-07-07 ENCOUNTER — Telehealth: Payer: Self-pay

## 2020-07-08 ENCOUNTER — Other Ambulatory Visit: Payer: Self-pay | Admitting: Nurse Practitioner

## 2020-07-08 DIAGNOSIS — M19011 Primary osteoarthritis, right shoulder: Secondary | ICD-10-CM

## 2020-07-08 DIAGNOSIS — M19012 Primary osteoarthritis, left shoulder: Secondary | ICD-10-CM

## 2020-07-08 MED ORDER — TRAMADOL HCL 50 MG PO TABS: ORAL_TABLET | ORAL | 1 refills | Status: DC

## 2020-07-08 NOTE — Telephone Encounter (Signed)
Sent prescription for #30 tablets with 1 refill to his pharmacy

## 2020-07-09 DIAGNOSIS — L97521 Non-pressure chronic ulcer of other part of left foot limited to breakdown of skin: Secondary | ICD-10-CM | POA: Diagnosis not present

## 2020-07-09 DIAGNOSIS — I739 Peripheral vascular disease, unspecified: Secondary | ICD-10-CM | POA: Diagnosis not present

## 2020-07-09 DIAGNOSIS — E1142 Type 2 diabetes mellitus with diabetic polyneuropathy: Secondary | ICD-10-CM | POA: Diagnosis not present

## 2020-07-09 DIAGNOSIS — Z89422 Acquired absence of other left toe(s): Secondary | ICD-10-CM | POA: Diagnosis not present

## 2020-07-09 DIAGNOSIS — L97512 Non-pressure chronic ulcer of other part of right foot with fat layer exposed: Secondary | ICD-10-CM | POA: Diagnosis not present

## 2020-07-17 ENCOUNTER — Ambulatory Visit (INDEPENDENT_AMBULATORY_CARE_PROVIDER_SITE_OTHER): Payer: Medicare Other | Admitting: Nurse Practitioner

## 2020-07-17 ENCOUNTER — Other Ambulatory Visit: Payer: Self-pay

## 2020-07-17 VITALS — BP 98/62 | HR 70 | Temp 97.5°F | Resp 16 | Ht 70.0 in | Wt 180.4 lb

## 2020-07-17 DIAGNOSIS — H60503 Unspecified acute noninfective otitis externa, bilateral: Secondary | ICD-10-CM

## 2020-07-17 DIAGNOSIS — H6123 Impacted cerumen, bilateral: Secondary | ICD-10-CM

## 2020-07-17 MED ORDER — OFLOXACIN 0.3 % OT SOLN: 5.0000 [drp] | Freq: Two times a day (BID) | OTIC | 0 refills | Status: DC

## 2020-07-17 NOTE — Progress Notes (Signed)
[facility] [place], [place] [address]  Internal MEDICINE  Office Visit Note  Patient Name: [name]  [id number]  [id number]  Date of Service: 08/09/2020  Chief Complaint  Patient presents with  . Acute Visit  . Ear Pain    trouble hearing out of right ear    The patient presents for acute visit. He states that he had pain in his ear last week. Since then, this has resolved. He has been unable to hear well out of the right ear since he had the ear pain. He has tried using OTC debrox, however, he has not been able to get anything out. He no longer has pain in the ears. Denies fever, congestion, or headache.       Current Medication: Outpatient Encounter Medications as of 07/17/2020  Medication Sig  . aspirin EC 81 MG tablet Take 81 mg by mouth daily.  . Calcium Carbonate-Vitamin D (CALCIUM PLUS VITAMIN D PO) Take 1 tablet by mouth daily.  Marland Kitchen glucose blood (ACCU-CHEK GUIDE) test strip Use as instructed  . glyBURIDE (DIABETA) 5 MG tablet Take 1 tablet (5 mg total) by mouth 2 (two) times daily with a meal.  . Lancets (ACCU-CHEK SOFT TOUCH) lancets Use as instructed  . meloxicam (MOBIC) 15 MG tablet Take by mouth.  . mometasone-formoterol (DULERA) 100-5 MCG/ACT AERO Inhale 2 puffs into the lungs 2 (two) times daily as needed for wheezing.  Mable Fill (ALLERGY EYE OP) Place 1 drop into both eyes daily as needed (for allergies).  . nystatin (MYCOSTATIN) 100000 UNIT/ML suspension Take 5 mLs (500,000 Units total) by mouth 4 (four) times daily.  . Omega-3 Fatty Acids (FISH OIL PO) Take 1 capsule by mouth daily.  . pneumococcal 23 valent vaccine (PNEUMOVAX 23) 25 MCG/0.5ML injection Inject 0.60ml IM once  . traMADol (ULTRAM) 50 MG tablet TAKE 1 TABLET BY MOUTH EVERY 12 HOURS ASNEEDED FOR PAIN  . vitamin B-12 (CYANOCOBALAMIN) 500 MCG tablet Take 500 mcg by mouth daily.   . [DISCONTINUED] clopidogrel (PLAVIX) 75 MG tablet TAKE 1 TABLET BY MOUTH  ONCE DAILY  . [DISCONTINUED] gabapentin (NEURONTIN) 300 MG capsule Take 1 capsule (300 mg total) by mouth 3 (three) times daily.  . [DISCONTINUED] lovastatin (MEVACOR) 10 MG tablet Take 1 tab  Po QHS  . [DISCONTINUED] oxybutynin (DITROPAN) 5 MG tablet Take 1 tablet (5 mg total) by mouth 2 (two) times daily.  Marland Kitchen ofloxacin (FLOXIN OTIC) 0.3 % OTIC solution Place 5 drops into both ears 2 (two) times daily.   No facility-administered encounter medications on file as of 07/17/2020.    Surgical History: Past Surgical History:  Procedure Laterality Date  . AMPUTATION TOE Left 02/25/2017   Procedure: AMPUTATION TOE-LEFT 2ND MPJ;  Surgeon: Samara Deist, DPM;  Location: [facility] ORS;  Service: Podiatry;  Laterality: Left;  . AMPUTATION TOE Left 10/20/2018   Procedure: TOE MPJ T2 LEFT;  Surgeon: Samara Deist, DPM;  Location: [facility] ORS;  Service: Podiatry;  Laterality: Left;  . AMPUTATION TOE Left 01/12/2019   Procedure: AMPUTATION LEFT 5TH TOE AND JOINT;  Surgeon: Samara Deist, DPM;  Location: [facility] ORS;  Service: Podiatry;  Laterality: Left;  . BACK SURGERY  2008  . CHOLECYSTECTOMY    . COLONOSCOPY WITH PROPOFOL N/A 08/15/2017   Procedure: COLONOSCOPY WITH PROPOFOL;  Surgeon: Lollie Sails, MD;  Location: [facility];  Service: Endoscopy;  Laterality: N/A;  . ENDARTERECTOMY Right 05/12/2017   Procedure: ENDARTERECTOMY CAROTID;  Surgeon: [name], MD;  Location: [facility] ORS;  Service: Vascular;  Laterality: Right;  . ENDARTERECTOMY FEMORAL Left 12/01/2018   Procedure: ENDARTERECTOMY FEMORAL;  Surgeon: [name], MD;  Location: [facility] ORS;  Service: Vascular;  Laterality: Left;  . ERCP    . FOOT SURGERY Right    x 3  . LOWER EXTREMITY ANGIOGRAM Left 12/01/2018   Procedure: LOWER EXTREMITY ANGIOGRAM;  Surgeon: [name], MD;  Location: [facility] ORS;  Service: Vascular;  Laterality: Left;  . LOWER EXTREMITY ANGIOGRAPHY Left 10/30/2018   Procedure: LOWER EXTREMITY ANGIOGRAPHY;  Surgeon: [name],  MD;  Location: [place] [facility] LAB;  Service: Cardiovascular;  Laterality: Left;  . LOWER EXTREMITY ANGIOGRAPHY Left 11/30/2018   Procedure: LOWER EXTREMITY ANGIOGRAPHY;  Surgeon: [name], MD;  Location: [place] CV LAB;  Service: Cardiovascular;  Laterality: Left;    Medical History: Past Medical History:  Diagnosis Date  . Cholecystitis, chronic   . Cholelithiasis   . COPD (chronic obstructive pulmonary disease) ([place])   . Diabetes mellitus without complication ([place])   . Diabetic retinopathy ([place])   . Dyspnea   . Fatty liver 2008  . Fracture of clavicle 1992   left   . Fracture of ribs, multiple 1992   3 ribs  . GERD (gastroesophageal reflux disease)   . Hyperlipidemia   . Hypertension   . Paroxysmal SVT (supraventricular tachycardia) ([place])   . Pelvis fracture ([place]) 1992  . Peripheral vascular disease ([place])   . Pleurisy   . Pleurisy   . Seasonal allergies     Family History: Family History  Problem Relation Age of Onset  . Diabetes Sister     Social History   Socioeconomic History  . Marital status: Single    Spouse name: Not on file  . Number of children: Not on file  . Years of education: Not on file  . Highest education level: Not on file  Occupational History  . Not on file  Tobacco Use  . Smoking status: Former Smoker    Quit date: 02/24/1997    Years since quitting: 23.4  . Smokeless tobacco: Never Used  Vaping Use  . Vaping Use: Never used  Substance and Sexual Activity  . Alcohol use: No  . Drug use: No  . Sexual activity: Not on file  Other Topics Concern  . Not on file  Social History Narrative  . Not on file   Social Determinants of Health   Financial Resource Strain:   . Difficulty of Paying Living Expenses: Not on file  Food Insecurity:   . Worried About Charity fundraiser in the Last Year: Not on file  . Ran Out of Food in the Last Year: Not on file  Transportation Needs:   . Lack of Transportation (Medical): Not on file   . Lack of Transportation (Non-Medical): Not on file  Physical Activity:   . Days of Exercise per Week: Not on file  . Minutes of Exercise per Session: Not on file  Stress:   . Feeling of Stress : Not on file  Social Connections:   . Frequency of Communication with Friends and Family: Not on file  . Frequency of Social Gatherings with Friends and Family: Not on file  . Attends Religious Services: Not on file  . Active Member of Clubs or Organizations: Not on file  . Attends Archivist Meetings: Not on file  . Marital Status: Not on file  Intimate Partner Violence:   . Fear  of Current or Ex-Partner: Not on file  . Emotionally Abused: Not on file  . Physically Abused: Not on file  . Sexually Abused: Not on file      Review of Systems  Constitutional: Negative for activity change, chills, fatigue and unexpected weight change.  HENT: Positive for hearing loss. Negative for congestion, postnasal drip, rhinorrhea, sneezing and sore throat.        Ears feel very clogged, like they are filled with wax. Has tried OTC debrox to get wax out, however, this has not helped. Both ears are slightly tender.  Respiratory: Negative for cough, chest tightness, shortness of breath and wheezing.   Cardiovascular: Negative for chest pain and palpitations.  Gastrointestinal: Negative for abdominal pain, constipation, diarrhea, nausea and vomiting.  Musculoskeletal: Negative for arthralgias, back pain, joint swelling and neck pain.  Skin: Negative for rash.  Neurological: Negative.  Negative for tremors and numbness.  Hematological: Negative for adenopathy. Does not bruise/bleed easily.  Psychiatric/Behavioral: Negative for behavioral problems (Depression), sleep disturbance and suicidal ideas. The patient is not nervous/anxious.     Today's Vitals   07/17/20 1500  BP: 98/62  Pulse: 70  Resp: 16  Temp: (!) 97.5 F (36.4 C)  SpO2: 96%  Weight: 180 lb 6.4 oz (81.8 kg)  Height: 5\' 10"   (1.778 m)   Body mass index is 25.88 kg/m.  Physical Exam Vitals and nursing note reviewed.  Constitutional:      General: He is not in acute distress.    Appearance: Normal appearance. He is well-developed. He is not diaphoretic.  HENT:     Head: Normocephalic and atraumatic.     Right Ear: Tenderness present. There is impacted cerumen. Tympanic membrane is erythematous.     Left Ear: Tenderness present. There is impacted cerumen. Tympanic membrane is erythematous.     Mouth/Throat:     Pharynx: No oropharyngeal exudate.  Eyes:     Pupils: Pupils are equal, round, and reactive to light.  Neck:     Thyroid: No thyromegaly.     Vascular: No JVD.     Trachea: No tracheal deviation.  Cardiovascular:     Rate and Rhythm: Normal rate and regular rhythm.     Heart sounds: Normal heart sounds. No murmur heard.  No friction rub. No gallop.   Pulmonary:     Effort: Pulmonary effort is normal. No respiratory distress.     Breath sounds: Normal breath sounds. No wheezing or rales.  Chest:     Chest wall: No tenderness.  Abdominal:     Palpations: Abdomen is soft.  Musculoskeletal:        General: Normal range of motion.     Cervical back: Normal range of motion and neck supple.  Lymphadenopathy:     Cervical: No cervical adenopathy.  Skin:    General: Skin is warm and dry.  Neurological:     Mental Status: He is alert and oriented to person, place, and time.     Cranial Nerves: No cranial nerve deficit.  Psychiatric:        Mood and Affect: Mood normal.        Behavior: Behavior normal.        Thought Content: Thought content normal.        Judgment: Judgment normal.    Assessment/Plan: 1. Bilateral impacted cerumen Bilateral ear lavage performed in the office. Cerumen impaction resolved. Patient tolerated procedure well. Improved hearing, bilaterally.  - Ear Lavage  2. Acute otitis externa  of both ears, unspecified type Start ofloxacin ear drops apply five drops in both  ears twice daily for the next week.  - ofloxacin (FLOXIN OTIC) 0.3 % OTIC solution; Place 5 drops into both ears 2 (two) times daily.  Dispense: 10 mL; Refill: 0  General Counseling: TRUE verbalizes understanding of the findings of todays visit and agrees with plan of treatment. I have discussed any further diagnostic evaluation that may be needed or ordered today. We also reviewed his medications today. he has been encouraged to call the office with any questions or concerns that should arise related to todays visit.  This patient was seen by Leretha Pol FNP Collaboration with Dr [name] as a part of collaborative care agreement  Orders Placed This Encounter  Procedures  . Ear Lavage    Meds ordered this encounter  Medications  . ofloxacin (FLOXIN OTIC) 0.3 % OTIC solution    Sig: Place 5 drops into both ears 2 (two) times daily.    Dispense:  10 mL    Refill:  0    Order Specific Question:   Supervising Provider    Answer:   [name] [[id number]    Total time spent: 30 Minutes   Time spent includes review of chart, medications, test results, and follow up plan with the patient.      Dr [name] Internal medicine

## 2020-07-22 DIAGNOSIS — E113313 Type 2 diabetes mellitus with moderate nonproliferative diabetic retinopathy with macular edema, bilateral: Secondary | ICD-10-CM | POA: Diagnosis not present

## 2020-07-24 ENCOUNTER — Other Ambulatory Visit: Payer: Self-pay

## 2020-07-24 DIAGNOSIS — N3281 Overactive bladder: Secondary | ICD-10-CM

## 2020-07-24 MED ORDER — OXYBUTYNIN CHLORIDE 5 MG PO TABS: 5.0000 mg | ORAL_TABLET | Freq: Two times a day (BID) | ORAL | 0 refills | Status: DC

## 2020-08-05 ENCOUNTER — Other Ambulatory Visit (INDEPENDENT_AMBULATORY_CARE_PROVIDER_SITE_OTHER): Payer: Self-pay | Admitting: Vascular Surgery

## 2020-08-05 ENCOUNTER — Other Ambulatory Visit: Payer: Self-pay

## 2020-08-05 MED ORDER — LOVASTATIN 10 MG PO TABS: ORAL_TABLET | ORAL | 1 refills | Status: DC

## 2020-08-06 ENCOUNTER — Other Ambulatory Visit: Payer: Self-pay

## 2020-08-06 DIAGNOSIS — D2371 Other benign neoplasm of skin of right lower limb, including hip: Secondary | ICD-10-CM | POA: Diagnosis not present

## 2020-08-06 DIAGNOSIS — L97512 Non-pressure chronic ulcer of other part of right foot with fat layer exposed: Secondary | ICD-10-CM | POA: Diagnosis not present

## 2020-08-06 DIAGNOSIS — Z89422 Acquired absence of other left toe(s): Secondary | ICD-10-CM | POA: Diagnosis not present

## 2020-08-06 DIAGNOSIS — B351 Tinea unguium: Secondary | ICD-10-CM | POA: Diagnosis not present

## 2020-08-06 DIAGNOSIS — I739 Peripheral vascular disease, unspecified: Secondary | ICD-10-CM | POA: Diagnosis not present

## 2020-08-06 DIAGNOSIS — E1142 Type 2 diabetes mellitus with diabetic polyneuropathy: Secondary | ICD-10-CM | POA: Diagnosis not present

## 2020-08-06 DIAGNOSIS — L97521 Non-pressure chronic ulcer of other part of left foot limited to breakdown of skin: Secondary | ICD-10-CM | POA: Diagnosis not present

## 2020-08-06 MED ORDER — GABAPENTIN 300 MG PO CAPS: 300.0000 mg | ORAL_CAPSULE | Freq: Three times a day (TID) | ORAL | 1 refills | Status: DC

## 2020-08-06 NOTE — Telephone Encounter (Signed)
The patient was seen on 05/20/20. Typically refills are authorized if the patient has been seen in the last year

## 2020-08-06 NOTE — Telephone Encounter (Signed)
Is this ok to fill it hasn't been filled in a year and the pt will not be seen in the office until next year?

## 2020-08-09 ENCOUNTER — Encounter: Payer: Self-pay | Admitting: Nurse Practitioner

## 2020-08-09 DIAGNOSIS — H6123 Impacted cerumen, bilateral: Secondary | ICD-10-CM | POA: Insufficient documentation

## 2020-08-09 DIAGNOSIS — H60503 Unspecified acute noninfective otitis externa, bilateral: Secondary | ICD-10-CM | POA: Insufficient documentation

## 2020-08-12 ENCOUNTER — Telehealth: Payer: Self-pay

## 2020-08-18 ENCOUNTER — Other Ambulatory Visit: Payer: Self-pay | Admitting: Nurse Practitioner

## 2020-08-18 ENCOUNTER — Other Ambulatory Visit: Payer: Self-pay

## 2020-08-18 DIAGNOSIS — M19011 Primary osteoarthritis, right shoulder: Secondary | ICD-10-CM

## 2020-08-18 DIAGNOSIS — E1165 Type 2 diabetes mellitus with hyperglycemia: Secondary | ICD-10-CM

## 2020-08-18 MED ORDER — GLYBURIDE 5 MG PO TABS: 5.0000 mg | ORAL_TABLET | Freq: Two times a day (BID) | ORAL | 3 refills | Status: DC

## 2020-08-18 MED ORDER — TRAMADOL HCL 50 MG PO TABS: ORAL_TABLET | ORAL | 1 refills | Status: DC

## 2020-08-18 NOTE — Telephone Encounter (Signed)
Sory. I just took care of this now

## 2020-09-17 ENCOUNTER — Other Ambulatory Visit: Payer: Self-pay

## 2020-09-17 DIAGNOSIS — E1142 Type 2 diabetes mellitus with diabetic polyneuropathy: Secondary | ICD-10-CM | POA: Diagnosis not present

## 2020-09-17 DIAGNOSIS — L97521 Non-pressure chronic ulcer of other part of left foot limited to breakdown of skin: Secondary | ICD-10-CM | POA: Diagnosis not present

## 2020-09-17 DIAGNOSIS — I739 Peripheral vascular disease, unspecified: Secondary | ICD-10-CM | POA: Diagnosis not present

## 2020-09-17 MED ORDER — GABAPENTIN 300 MG PO CAPS
300.0000 mg | ORAL_CAPSULE | Freq: Three times a day (TID) | ORAL | 1 refills | Status: DC
Start: 2020-09-17 — End: 2020-11-28

## 2020-09-22 ENCOUNTER — Encounter: Payer: Self-pay | Admitting: Nurse Practitioner

## 2020-09-22 ENCOUNTER — Ambulatory Visit (INDEPENDENT_AMBULATORY_CARE_PROVIDER_SITE_OTHER): Payer: Medicare Other | Admitting: Nurse Practitioner

## 2020-09-22 ENCOUNTER — Other Ambulatory Visit: Payer: Self-pay

## 2020-09-22 VITALS — BP 127/74 | HR 65 | Temp 97.3°F | Resp 16 | Ht 70.0 in | Wt 179.0 lb

## 2020-09-22 DIAGNOSIS — I70269 Atherosclerosis of native arteries of extremities with gangrene, unspecified extremity: Secondary | ICD-10-CM

## 2020-09-22 DIAGNOSIS — E1165 Type 2 diabetes mellitus with hyperglycemia: Secondary | ICD-10-CM

## 2020-09-22 DIAGNOSIS — J45909 Unspecified asthma, uncomplicated: Secondary | ICD-10-CM

## 2020-09-22 DIAGNOSIS — M19012 Primary osteoarthritis, left shoulder: Secondary | ICD-10-CM | POA: Diagnosis not present

## 2020-09-22 DIAGNOSIS — M19011 Primary osteoarthritis, right shoulder: Secondary | ICD-10-CM | POA: Diagnosis not present

## 2020-09-22 LAB — POCT GLYCOSYLATED HEMOGLOBIN (HGB A1C): Hemoglobin A1C: 7.4 % — AB (ref 4.0–5.6)

## 2020-09-22 MED ORDER — TRAMADOL HCL 50 MG PO TABS: ORAL_TABLET | ORAL | 2 refills | Status: DC

## 2020-09-22 NOTE — Progress Notes (Signed)
[facility] [place], [place] [address]  Internal MEDICINE  Office Visit Note  Patient Name: [name]  [id number]  [id number]  Date of Service: 10/16/2020  Chief Complaint  Patient presents with  . Diabetes  . Hyperlipidemia  . Hypertension  . controlled substance form    reviewed    The patient is here for follow up visit.  -Blood sugars are stable. HgbA1c is 7.4 today. He routinely sees vein and vascular and podiatry due to diabetic ulceration and peripheral vascular disease.  -He does have chronic pain in both feet  He continues to take tramadol 50mg  tablets as needed to help with moderate to severe pain. He also has pain in the left shoulder. He has bulging disk in the lumbar spine which is not operable. He has been taking this prescription for some time. Helps to relieve his pain and allows him to participate in usual activities and continue working. He has no negative side effects from taking this medication. He states that pain is most severe when he first gets up in the mornings. That is when he generally takes dose of tramadol. He often does not take second dose.  -blood pressure well managed with current medication.  -he did get flu shot and covid 19 booster vaccine on same day 07/01/2020.  -breathing is well managed.  -the patient does have history of carotid atherosclerosis. He sees vein and vascular surgeon routinely for surveillance.       Current Medication: Outpatient Encounter Medications as of 09/22/2020  Medication Sig  . aspirin EC 81 MG tablet Take 81 mg by mouth daily.  . Calcium Carbonate-Vitamin D (CALCIUM PLUS VITAMIN D PO) Take 1 tablet by mouth daily.  . clopidogrel (PLAVIX) 75 MG tablet TAKE 1 TABLET BY MOUTH ONCE DAILY  . gabapentin (NEURONTIN) 300 MG capsule Take 1 capsule (300 mg total) by mouth 3 (three) times daily.  Marland Kitchen glucose blood (ACCU-CHEK GUIDE) test strip Use as instructed  . glyBURIDE (DIABETA) 5 MG tablet Take  1 tablet (5 mg total) by mouth 2 (two) times daily with a meal.  . Lancets (ACCU-CHEK SOFT TOUCH) lancets Use as instructed  . lovastatin (MEVACOR) 10 MG tablet Take 1 tab  Po QHS  . meloxicam (MOBIC) 15 MG tablet Take by mouth.  . mometasone-formoterol (DULERA) 100-5 MCG/ACT AERO Inhale 2 puffs into the lungs 2 (two) times daily as needed for wheezing.  Mable Fill (ALLERGY EYE OP) Place 1 drop into both eyes daily as needed (for allergies).  . nystatin (MYCOSTATIN) 100000 UNIT/ML suspension Take 5 mLs (500,000 Units total) by mouth 4 (four) times daily.  Marland Kitchen ofloxacin (FLOXIN OTIC) 0.3 % OTIC solution Place 5 drops into both ears 2 (two) times daily.  . Omega-3 Fatty Acids (FISH OIL PO) Take 1 capsule by mouth daily.  Marland Kitchen oxybutynin (DITROPAN) 5 MG tablet Take 1 tablet (5 mg total) by mouth 2 (two) times daily.  . pneumococcal 23 valent vaccine (PNEUMOVAX 23) 25 MCG/0.5ML injection Inject 0.53ml IM once  . vitamin B-12 (CYANOCOBALAMIN) 500 MCG tablet Take 500 mcg by mouth daily.   . [DISCONTINUED] traMADol (ULTRAM) 50 MG tablet TAKE 1 TABLET BY MOUTH EVERY 12 HOURS ASNEEDED FOR PAIN  . traMADol (ULTRAM) 50 MG tablet TAKE 1 TABLET BY MOUTH EVERY 12 HOURS ASNEEDED FOR PAIN   No facility-administered encounter medications on file as of 09/22/2020.    Surgical History: Past Surgical History:  Procedure Laterality Date  . AMPUTATION TOE  Left 02/25/2017   Procedure: AMPUTATION TOE-LEFT 2ND MPJ;  Surgeon: Samara Deist, DPM;  Location: [facility] ORS;  Service: Podiatry;  Laterality: Left;  . AMPUTATION TOE Left 10/20/2018   Procedure: TOE MPJ T2 LEFT;  Surgeon: Samara Deist, DPM;  Location: [facility] ORS;  Service: Podiatry;  Laterality: Left;  . AMPUTATION TOE Left 01/12/2019   Procedure: AMPUTATION LEFT 5TH TOE AND JOINT;  Surgeon: Samara Deist, DPM;  Location: [facility] ORS;  Service: Podiatry;  Laterality: Left;  . BACK SURGERY  2008  . CHOLECYSTECTOMY    . COLONOSCOPY WITH PROPOFOL N/A  08/15/2017   Procedure: COLONOSCOPY WITH PROPOFOL;  Surgeon: Lollie Sails, MD;  Location: [facility] ENDOSCOPY;  Service: Endoscopy;  Laterality: N/A;  . ENDARTERECTOMY Right 05/12/2017   Procedure: ENDARTERECTOMY CAROTID;  Surgeon: [name], MD;  Location: [facility] ORS;  Service: Vascular;  Laterality: Right;  . ENDARTERECTOMY FEMORAL Left 12/01/2018   Procedure: ENDARTERECTOMY FEMORAL;  Surgeon: [name], MD;  Location: [facility] ORS;  Service: Vascular;  Laterality: Left;  . ERCP    . FOOT SURGERY Right    x 3  . LOWER EXTREMITY ANGIOGRAM Left 12/01/2018   Procedure: LOWER EXTREMITY ANGIOGRAM;  Surgeon: [name], MD;  Location: [facility] ORS;  Service: Vascular;  Laterality: Left;  . LOWER EXTREMITY ANGIOGRAPHY Left 10/30/2018   Procedure: LOWER EXTREMITY ANGIOGRAPHY;  Surgeon: [name], MD;  Location: [place] CV LAB;  Service: Cardiovascular;  Laterality: Left;  . LOWER EXTREMITY ANGIOGRAPHY Left 11/30/2018   Procedure: LOWER EXTREMITY ANGIOGRAPHY;  Surgeon: [name], MD;  Location: [place] CV LAB;  Service: Cardiovascular;  Laterality: Left;    Medical History: Past Medical History:  Diagnosis Date  . Cholecystitis, chronic   . Cholelithiasis   . COPD (chronic obstructive pulmonary disease) ([place])   . Diabetes mellitus without complication ([place])   . Diabetic retinopathy ([place])   . Dyspnea   . Fatty liver 2008  . Fracture of clavicle 1992   left   . Fracture of ribs, multiple 1992   3 ribs  . GERD (gastroesophageal reflux disease)   . Hyperlipidemia   . Hypertension   . Paroxysmal SVT (supraventricular tachycardia) ([place])   . Pelvis fracture ([place]) 1992  . Peripheral vascular disease ([place])   . Pleurisy   . Pleurisy   . Seasonal allergies     Family History: Family History  Problem Relation Age of Onset  . Diabetes Sister     Social History   Socioeconomic History  . Marital status: Single    Spouse name: Not on file  . Number of children: Not on file  .  Years of education: Not on file  . Highest education level: Not on file  Occupational History  . Not on file  Tobacco Use  . Smoking status: Former Smoker    Quit date: 02/24/1997    Years since quitting: 23.6  . Smokeless tobacco: Never Used  Vaping Use  . Vaping Use: Never used  Substance and Sexual Activity  . Alcohol use: No  . Drug use: No  . Sexual activity: Not on file  Other Topics Concern  . Not on file  Social History Narrative  . Not on file   Social Determinants of Health   Financial Resource Strain: Not on file  Food Insecurity: Not on file  Transportation Needs: Not on file  Physical Activity: Not on file  Stress: Not on file  Social Connections: Not on file  Intimate Partner  Violence: Not on file      Review of Systems  Constitutional: Negative for activity change, chills, fatigue and unexpected weight change.  HENT: Negative for congestion, postnasal drip, rhinorrhea, sneezing and sore throat.   Respiratory: Negative for cough, chest tightness, shortness of breath and wheezing.        History of moderate COPD which is managed well with current medications.   Cardiovascular: Negative for chest pain and palpitations.  Gastrointestinal: Negative for abdominal pain, constipation, diarrhea, nausea and vomiting.  Endocrine: Negative for cold intolerance, heat intolerance, polydipsia and polyuria.       Blood sugars doing well   Musculoskeletal: Negative for arthralgias, back pain, joint swelling and neck pain.         Chronic, bilateral shoulder pain. Intermittent and made worse with exertion.   Also has pain in lower back and both feet.   Skin: Negative for rash.  Allergic/Immunologic: Positive for environmental allergies.  Neurological: Negative for dizziness, tremors, numbness and headaches.  Hematological: Negative for adenopathy. Does not bruise/bleed easily.  Psychiatric/Behavioral: Negative for behavioral problems (Depression), sleep disturbance and  suicidal ideas. The patient is not nervous/anxious.     Today's Vitals   09/22/20 0828  BP: 127/74  Pulse: 65  Resp: 16  Temp: (!) 97.3 F (36.3 C)  SpO2: 96%  Weight: 179 lb (81.2 kg)  Height: 5\' 10"  (1.778 m)   Body mass index is 25.68 kg/m.  Physical Exam Vitals and nursing note reviewed.  Constitutional:      General: He is not in acute distress.    Appearance: Normal appearance. He is well-developed and well-nourished. He is not diaphoretic.  HENT:     Head: Normocephalic and atraumatic.     Mouth/Throat:     Mouth: Oropharynx is clear and moist.     Pharynx: No oropharyngeal exudate.  Eyes:     Extraocular Movements: EOM normal.     Pupils: Pupils are equal, round, and reactive to light.  Neck:     Thyroid: No thyromegaly.     Vascular: No JVD.     Trachea: No tracheal deviation.  Cardiovascular:     Rate and Rhythm: Normal rate and regular rhythm.     Heart sounds: Normal heart sounds. No murmur heard. No friction rub. No gallop.   Pulmonary:     Effort: Pulmonary effort is normal. No respiratory distress.     Breath sounds: No wheezing or rales.     Comments: Mildly diminished breath sounds in bilateral lung bases.  Chest:     Chest wall: No tenderness.  Abdominal:     Palpations: Abdomen is soft.  Musculoskeletal:        General: Normal range of motion.     Cervical back: Normal range of motion and neck supple.     Comments: Mild to moderate lower back pain which is worse with bending and twisting at the waist. No visible or palpable abnormalities at this time.     Lymphadenopathy:     Cervical: No cervical adenopathy.  Skin:    General: Skin is warm and dry.  Neurological:     General: No focal deficit present.     Mental Status: He is alert and oriented to person, place, and time.     Cranial Nerves: No cranial nerve deficit.  Psychiatric:        Mood and Affect: Mood and affect and mood normal.        Behavior: Behavior normal.  Thought  Content: Thought content normal.        Judgment: Judgment normal.    Assessment/Plan: 1. Type 2 diabetes mellitus with hyperglycemia, without long-term current use of insulin (HCC) - POCT HgB A1C  7.4 today. Continue diabetic medication as prescribed   2. Primary osteoarthritis of shoulders, bilateral May take tramadol 50mg  up to twice daily as needed for pain. New prescription sent to his pharmacy today.  - traMADol (ULTRAM) 50 MG tablet; TAKE 1 TABLET BY MOUTH EVERY 12 HOURS ASNEEDED FOR PAIN  Dispense: 45 tablet; Refill: 2  3. 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   4. Moderate asthma without complication, unspecified whether persistent Use inhalers and respiratory medications as prescribed   General Counseling: Maximilliano verbalizes understanding of the findings of todays visit and agrees with plan of treatment. I have discussed any further diagnostic evaluation that may be needed or ordered today. We also reviewed his medications today. he has been encouraged to call the office with any questions or concerns that should arise related to todays visit.  Diabetes Counseling:  1. Addition of ACE inh/ ARB'S for nephroprotection. Microalbumin is updated  2. Diabetic foot care, prevention of complications. Podiatry consult 3. Exercise and lose weight.  4. Diabetic eye examination, Diabetic eye exam is updated  5. Monitor blood sugar closlely. nutrition counseling.  6. Sign and symptoms of hypoglycemia including shaking sweating,confusion and headaches.   This patient was seen by Leretha Pol FNP Collaboration with Dr [name] as a part of collaborative care agreement  Orders Placed This Encounter  Procedures  . POCT HgB A1C    Meds ordered this encounter  Medications  . traMADol (ULTRAM) 50 MG tablet    Sig: TAKE 1 TABLET BY MOUTH EVERY 12 HOURS ASNEEDED FOR  PAIN    Dispense:  45 tablet    Refill:  2    Order Specific Question:   Supervising Provider    Answer:   [name] [id number]    Total time spent: 30 Minutes   Time spent includes review of chart, medications, test results, and follow up plan with the patient.      Dr [name] Internal medicine

## 2020-10-16 DIAGNOSIS — J45909 Unspecified asthma, uncomplicated: Secondary | ICD-10-CM | POA: Insufficient documentation

## 2020-10-22 DIAGNOSIS — Z89422 Acquired absence of other left toe(s): Secondary | ICD-10-CM | POA: Diagnosis not present

## 2020-10-22 DIAGNOSIS — E1142 Type 2 diabetes mellitus with diabetic polyneuropathy: Secondary | ICD-10-CM | POA: Diagnosis not present

## 2020-10-22 DIAGNOSIS — L97521 Non-pressure chronic ulcer of other part of left foot limited to breakdown of skin: Secondary | ICD-10-CM | POA: Diagnosis not present

## 2020-10-22 DIAGNOSIS — L97512 Non-pressure chronic ulcer of other part of right foot with fat layer exposed: Secondary | ICD-10-CM | POA: Diagnosis not present

## 2020-10-22 DIAGNOSIS — I739 Peripheral vascular disease, unspecified: Secondary | ICD-10-CM | POA: Diagnosis not present

## 2020-11-03 ENCOUNTER — Ambulatory Visit
Admission: RE | Admit: 2020-11-03 | Discharge: 2020-11-03 | Disposition: A | Discharge status: Home / Self Care | Payer: Medicare HMO | Source: Ambulatory Visit | Attending: Hospice and Palliative Medicine | Admitting: Hospice and Palliative Medicine

## 2020-11-03 ENCOUNTER — Encounter: Payer: Self-pay | Admitting: Hospice and Palliative Medicine

## 2020-11-03 ENCOUNTER — Ambulatory Visit (INDEPENDENT_AMBULATORY_CARE_PROVIDER_SITE_OTHER): Payer: Medicare HMO | Admitting: Hospice and Palliative Medicine

## 2020-11-03 ENCOUNTER — Other Ambulatory Visit: Payer: Self-pay

## 2020-11-03 VITALS — BP 114/66 | HR 76 | Temp 97.6°F | Resp 16 | Ht 70.0 in | Wt 170.0 lb

## 2020-11-03 DIAGNOSIS — R109 Unspecified abdominal pain: Secondary | ICD-10-CM

## 2020-11-03 DIAGNOSIS — R110 Nausea: Secondary | ICD-10-CM

## 2020-11-03 IMAGING — CR DG ABDOMEN 1V
1 series · 2 of 2 positions shown · non-contrast
Comparison: [DATE] and prior.

CLINICAL DATA: abdmonial pain

EXAM:
ABDOMEN - 1 VIEW

[Series 1: dg abd 1 view · 0.14mm/px · 2 of 2 slices shown]
[im 1/2]
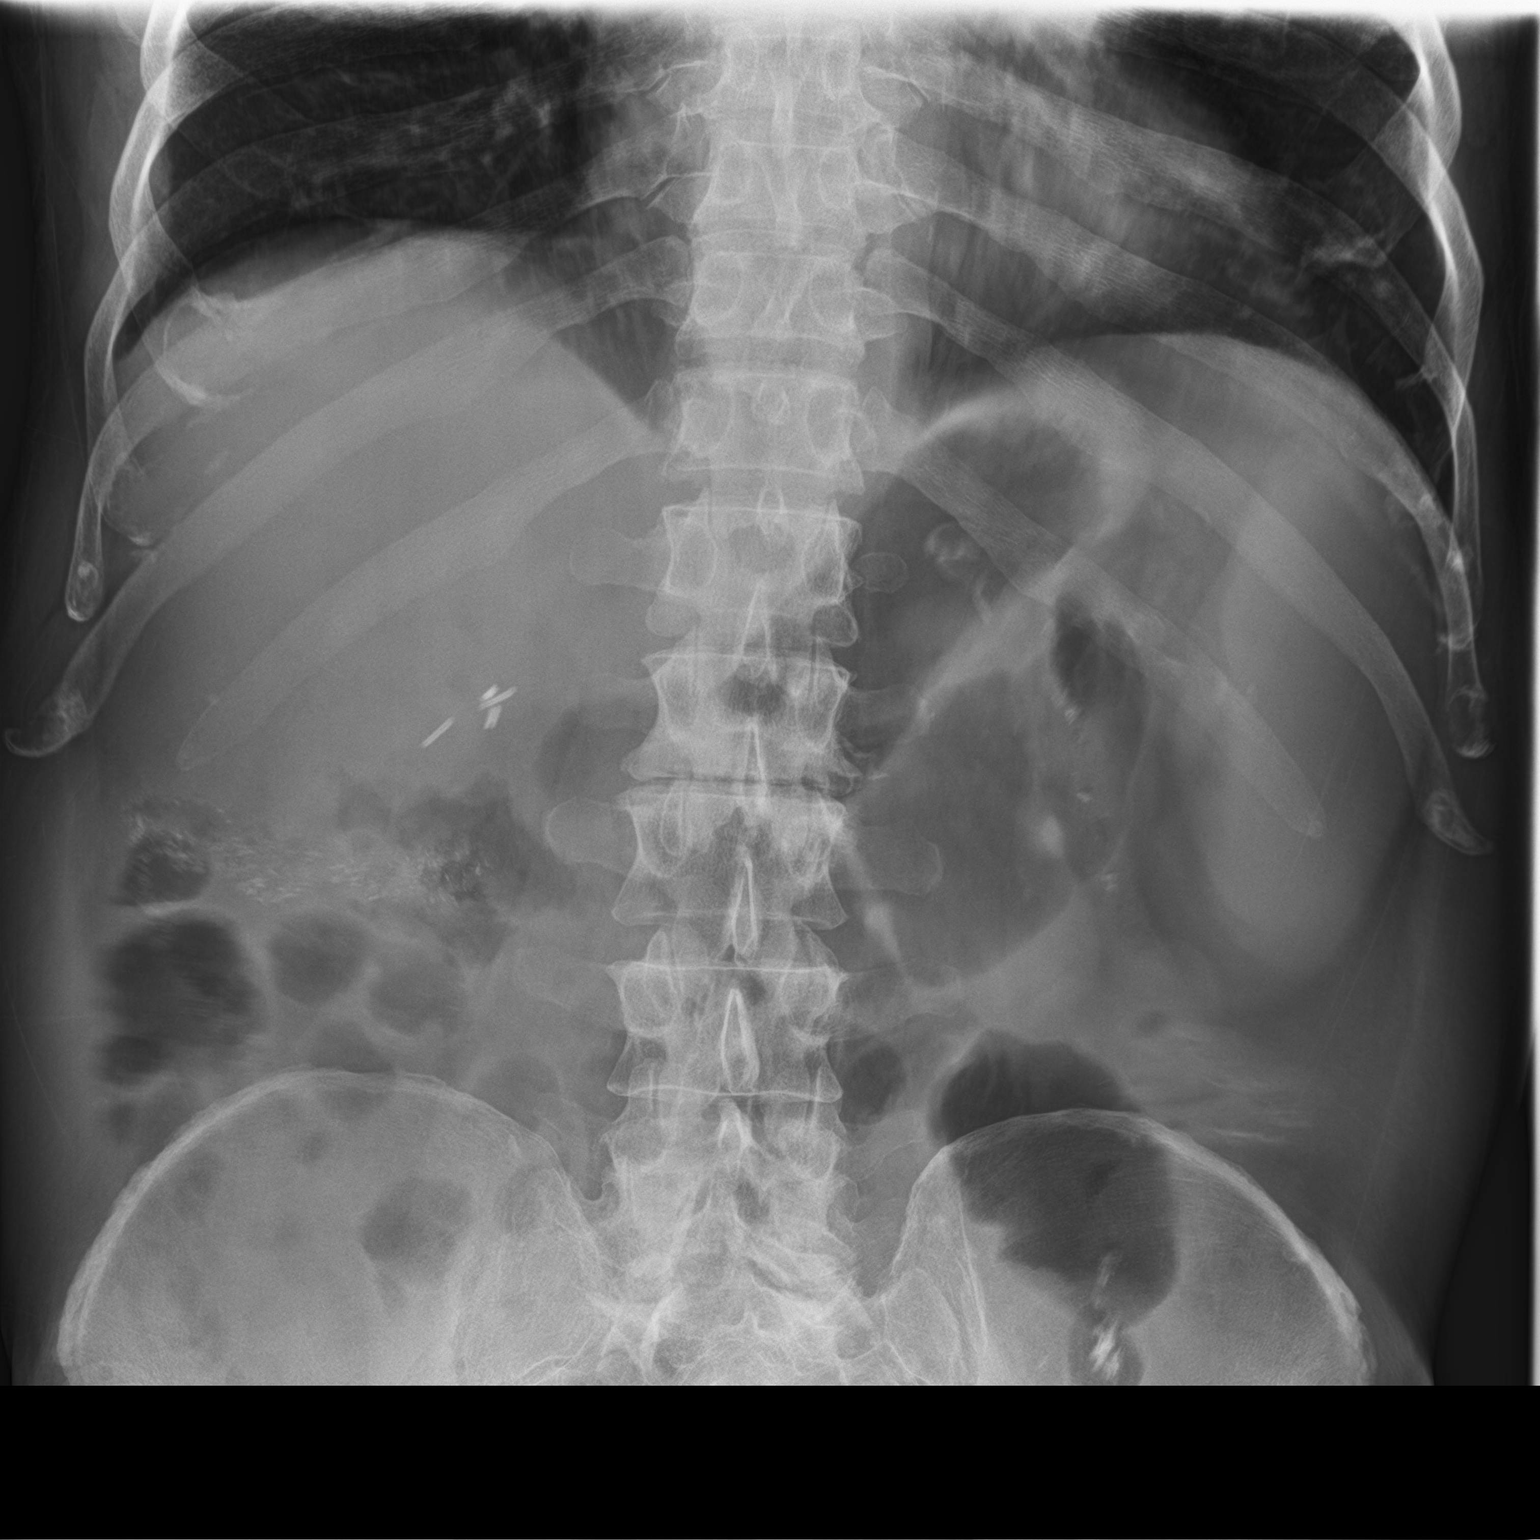
[im 2/2]
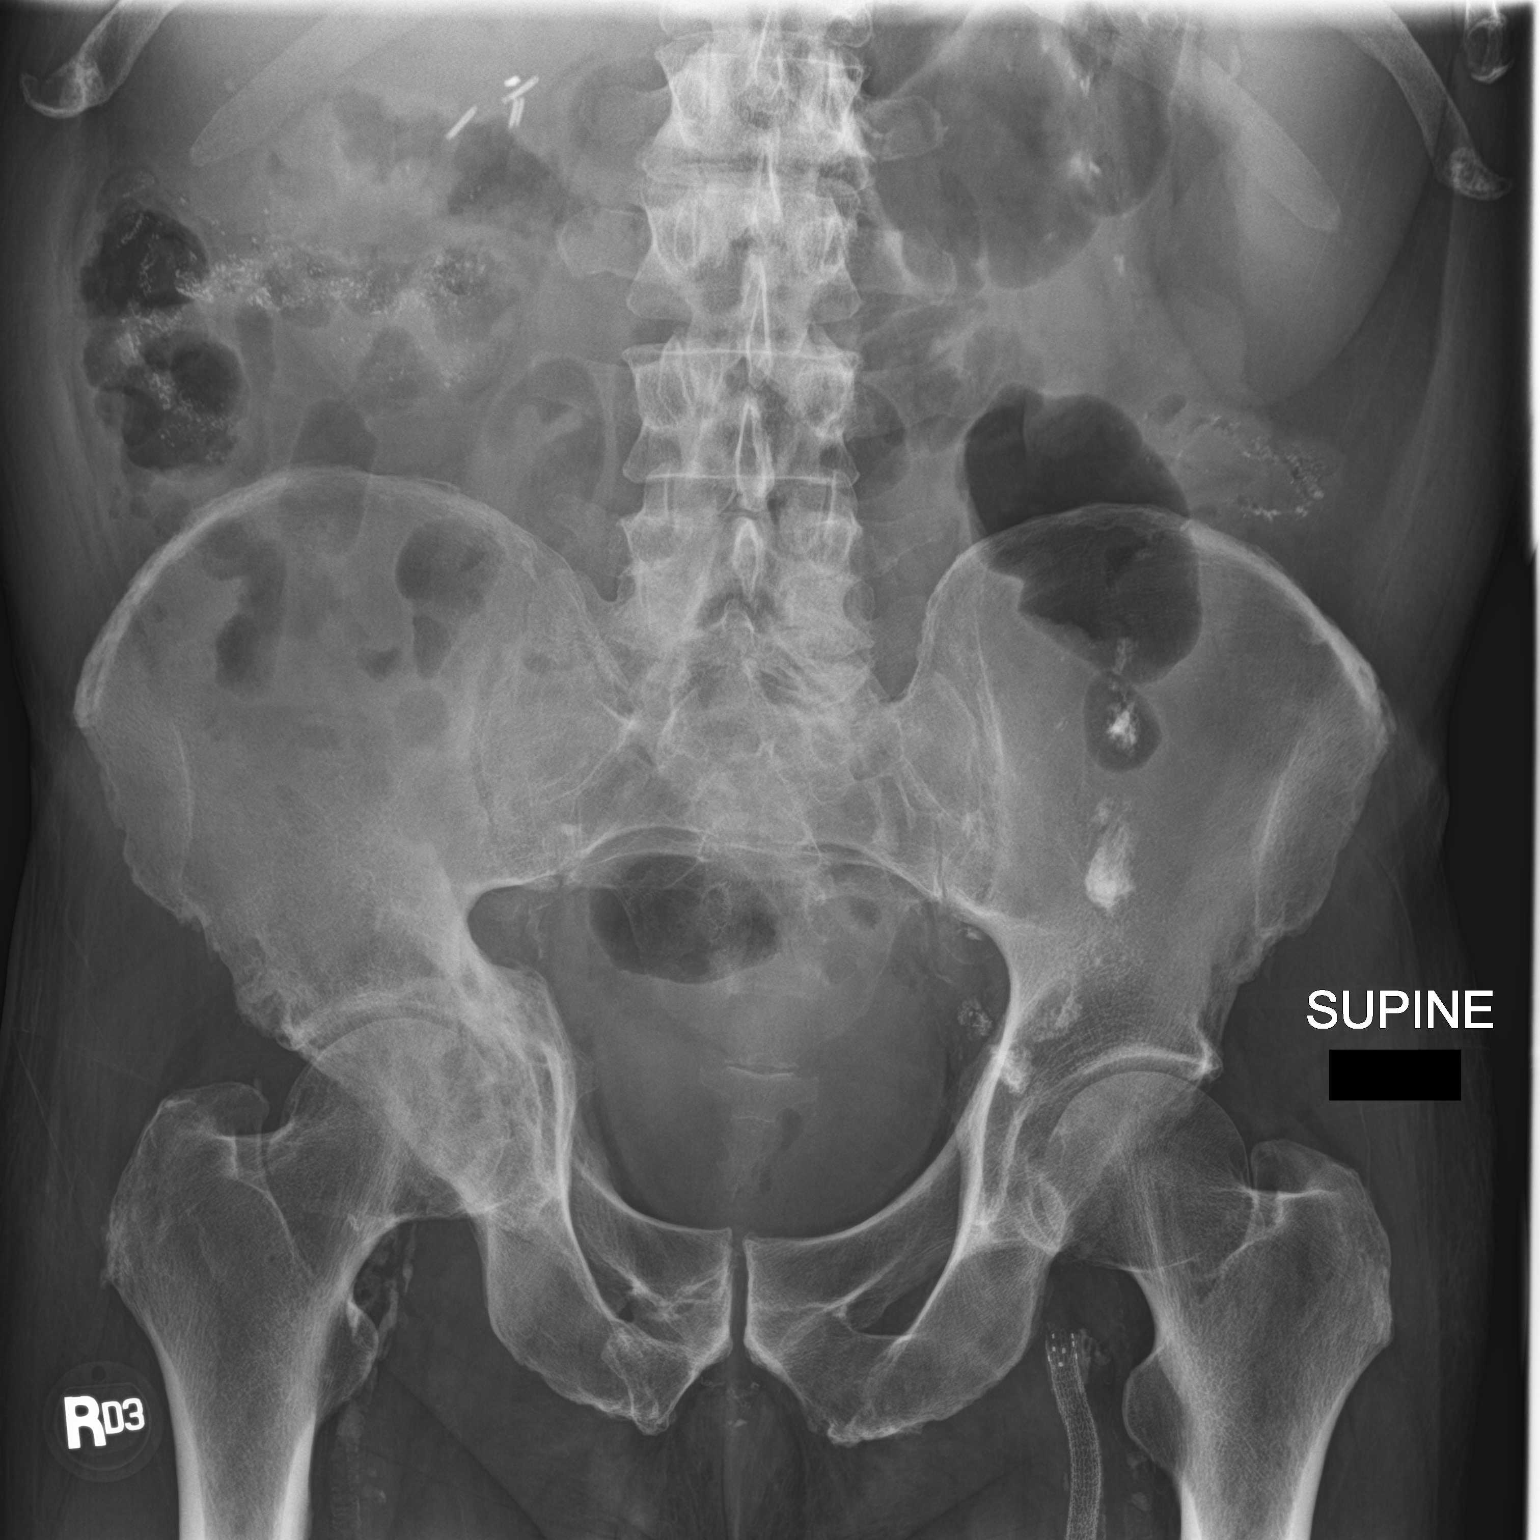

[2 of 2 positions shown; findings below may reference images not displayed]

FINDINGS: Gas is seen within nondilated bowel loops. No evidence of
pneumoperitoneum. Post cholecystectomy sequela. Hyperdense foci
overlying the bowel may reflect ingested material. Vascular
calcifications and partially imaged left lower extremity stent.
IMPRESSION: Nonobstructive bowel gas pattern.

## 2020-11-03 MED ORDER — ONDANSETRON 4 MG PO TBDP: 4.0000 mg | ORAL_TABLET | Freq: Three times a day (TID) | ORAL | 0 refills | Status: DC | PRN

## 2020-11-03 NOTE — Progress Notes (Signed)
[facility] [place], [place] [address]  Internal MEDICINE  Office Visit Note  Patient Name: [name]  [id number]  [id number]  Date of Service: 11/07/2020  Chief Complaint  Patient presents with  . Acute Visit  . nausea  . Diarrhea    After eating      HPI Pt is here for a sick visit. Complaining of abdominal pain and diarrhea--symptoms have been ongoing for about 2 days Has not had any diarrhea today but still has abdominal pain--pain is mid-lower abdominal pain Symptoms initially started right after eating a meal at home, nausea and diarrhea have improved but he continues to have pain Pain has also improved but continues to linger Was able to eat breakfast this morning which is improvement due to the last 2 days not being able to tolerate any food  Current Medication:  Outpatient Encounter Medications as of 11/03/2020  Medication Sig  . ondansetron (ZOFRAN-ODT) 4 MG disintegrating tablet Take 1 tablet (4 mg total) by mouth every 8 (eight) hours as needed for nausea or vomiting.  Marland Kitchen aspirin EC 81 MG tablet Take 81 mg by mouth daily.  . Calcium Carbonate-Vitamin D (CALCIUM PLUS VITAMIN D PO) Take 1 tablet by mouth daily.  . clopidogrel (PLAVIX) 75 MG tablet TAKE 1 TABLET BY MOUTH ONCE DAILY  . gabapentin (NEURONTIN) 300 MG capsule Take 1 capsule (300 mg total) by mouth 3 (three) times daily.  Marland Kitchen glucose blood (ACCU-CHEK GUIDE) test strip Use as instructed  . glyBURIDE (DIABETA) 5 MG tablet Take 1 tablet (5 mg total) by mouth 2 (two) times daily with a meal.  . Lancets (ACCU-CHEK SOFT TOUCH) lancets Use as instructed  . lovastatin (MEVACOR) 10 MG tablet Take 1 tab  Po QHS  . meloxicam (MOBIC) 15 MG tablet Take by mouth.  . mometasone-formoterol (DULERA) 100-5 MCG/ACT AERO Inhale 2 puffs into the lungs 2 (two) times daily as needed for wheezing.  Mable Fill (ALLERGY EYE OP) Place 1 drop into both eyes daily as needed (for allergies).   . nystatin (MYCOSTATIN) 100000 UNIT/ML suspension Take 5 mLs (500,000 Units total) by mouth 4 (four) times daily.  Marland Kitchen ofloxacin (FLOXIN OTIC) 0.3 % OTIC solution Place 5 drops into both ears 2 (two) times daily.  . Omega-3 Fatty Acids (FISH OIL PO) Take 1 capsule by mouth daily.  Marland Kitchen oxybutynin (DITROPAN) 5 MG tablet Take 1 tablet (5 mg total) by mouth 2 (two) times daily.  . pneumococcal 23 valent vaccine (PNEUMOVAX 23) 25 MCG/0.5ML injection Inject 0.80ml IM once  . traMADol (ULTRAM) 50 MG tablet TAKE 1 TABLET BY MOUTH EVERY 12 HOURS ASNEEDED FOR PAIN  . vitamin B-12 (CYANOCOBALAMIN) 500 MCG tablet Take 500 mcg by mouth daily.    No facility-administered encounter medications on file as of 11/03/2020.      Medical History: Past Medical History:  Diagnosis Date  . Cholecystitis, chronic   . Cholelithiasis   . COPD (chronic obstructive pulmonary disease) ([place])   . Diabetes mellitus without complication ([place])   . Diabetic retinopathy ([place])   . Dyspnea   . Fatty liver 2008  . Fracture of clavicle 1992   left   . Fracture of ribs, multiple 1992   3 ribs  . GERD (gastroesophageal reflux disease)   . Hyperlipidemia   . Hypertension   . Paroxysmal SVT (supraventricular tachycardia) ([place])   . Pelvis fracture ([place]) 1992  . Peripheral vascular disease ([place])   . Pleurisy   .  Pleurisy   . Seasonal allergies      Vital Signs: BP 114/66   Pulse 76   Temp 97.6 F (36.4 C)   Resp 16   Ht 5\' 10"  (1.778 m)   Wt 170 lb (77.1 kg)   SpO2 95%   BMI 24.39 kg/m    Review of Systems  Constitutional: Negative for chills, fatigue and unexpected weight change.  HENT: Negative for congestion, postnasal drip, rhinorrhea, sneezing and sore throat.   Eyes: Negative for redness.  Respiratory: Negative for cough, chest tightness and shortness of breath.   Cardiovascular: Negative for chest pain and palpitations.  Gastrointestinal: Positive for abdominal pain, diarrhea and nausea. Negative for  constipation and vomiting.  Genitourinary: Negative for dysuria and frequency.  Musculoskeletal: Negative for arthralgias, back pain, joint swelling and neck pain.  Skin: Negative for rash.  Neurological: Negative for tremors and numbness.  Hematological: Negative for adenopathy. Does not bruise/bleed easily.  Psychiatric/Behavioral: Negative for behavioral problems (Depression), sleep disturbance and suicidal ideas. The patient is not nervous/anxious.     Physical Exam Vitals reviewed.  Constitutional:      Appearance: Normal appearance. He is normal weight.  Cardiovascular:     Rate and Rhythm: Normal rate and regular rhythm.     Pulses: Normal pulses.     Heart sounds: Normal heart sounds.  Pulmonary:     Effort: Pulmonary effort is normal.     Breath sounds: Normal breath sounds.  Abdominal:     General: Bowel sounds are normal. There is distension.     Palpations: Abdomen is soft.  Musculoskeletal:        General: Normal range of motion.     Cervical back: Normal range of motion.  Skin:    General: Skin is warm.  Neurological:     General: No focal deficit present.     Mental Status: He is alert and oriented to person, place, and time. Mental status is at baseline.  Psychiatric:        Mood and Affect: Mood normal.        Behavior: Behavior normal.        Thought Content: Thought content normal.        Judgment: Judgment normal.    Assessment/Plan: 1. Acute abdominal pain Review xray and will treat accordingly - DG Abd 1 View; Future  2. Nausea May take Zofran as needed for ongoing nausea - ondansetron (ZOFRAN-ODT) 4 MG disintegrating tablet; Take 1 tablet (4 mg total) by mouth every 8 (eight) hours as needed for nausea or vomiting.  Dispense: 20 tablet; Refill: 0  General Counseling: [name] verbalizes understanding of the findings of todays visit and agrees with plan of treatment. I have discussed any further diagnostic evaluation that may be needed or ordered  today. We also reviewed his medications today. he has been encouraged to call the office with any questions or concerns that should arise related to todays visit.   Orders Placed This Encounter  Procedures  . DG Abd 1 View    Meds ordered this encounter  Medications  . ondansetron (ZOFRAN-ODT) 4 MG disintegrating tablet    Sig: Take 1 tablet (4 mg total) by mouth every 8 (eight) hours as needed for nausea or vomiting.    Dispense:  20 tablet    Refill:  0    Time spent: 25 Minutes Time spent includes review of chart, medications, test results and follow-up plan with the patient.  This patient was seen by Lovena Le  Caiden Arteaga AGNP-C in Collaboration with Dr [name] as a part of collaborative care agreement.  Tanna Furry [facility] Internal Medicine

## 2020-11-06 NOTE — Progress Notes (Signed)
Please call patient and let him know xray for abdomen did not show evidence of obstruction. Please ask how his abdominal pain is? Thanks!

## 2020-11-07 ENCOUNTER — Encounter: Payer: Self-pay | Admitting: Hospice and Palliative Medicine

## 2020-11-10 ENCOUNTER — Other Ambulatory Visit: Payer: Self-pay

## 2020-11-10 DIAGNOSIS — N3281 Overactive bladder: Secondary | ICD-10-CM

## 2020-11-10 MED ORDER — OXYBUTYNIN CHLORIDE 5 MG PO TABS: 5.0000 mg | ORAL_TABLET | Freq: Two times a day (BID) | ORAL | 0 refills | Status: DC

## 2020-11-11 ENCOUNTER — Other Ambulatory Visit: Payer: Self-pay

## 2020-11-11 ENCOUNTER — Emergency Department: Payer: Medicare HMO

## 2020-11-11 ENCOUNTER — Inpatient Hospital Stay
Admission: EM | Admit: 2020-11-11 | Discharge: 2020-11-24 | DRG: 854 | Disposition: A | Discharge status: Home Health | Payer: Medicare HMO | Attending: Internal Medicine | Admitting: Internal Medicine

## 2020-11-11 ENCOUNTER — Ambulatory Visit (INDEPENDENT_AMBULATORY_CARE_PROVIDER_SITE_OTHER): Payer: Medicare HMO | Admitting: Internal Medicine

## 2020-11-11 ENCOUNTER — Encounter: Payer: Self-pay | Admitting: Internal Medicine

## 2020-11-11 VITALS — BP 120/56 | HR 91 | Temp 97.3°F | Resp 16 | Ht 70.0 in | Wt 181.4 lb

## 2020-11-11 DIAGNOSIS — E119 Type 2 diabetes mellitus without complications: Secondary | ICD-10-CM | POA: Diagnosis not present

## 2020-11-11 DIAGNOSIS — E11628 Type 2 diabetes mellitus with other skin complications: Secondary | ICD-10-CM | POA: Diagnosis present

## 2020-11-11 DIAGNOSIS — A4101 Sepsis due to Methicillin susceptible Staphylococcus aureus: Secondary | ICD-10-CM | POA: Diagnosis not present

## 2020-11-11 DIAGNOSIS — L97529 Non-pressure chronic ulcer of other part of left foot with unspecified severity: Secondary | ICD-10-CM | POA: Diagnosis present

## 2020-11-11 DIAGNOSIS — N3289 Other specified disorders of bladder: Secondary | ICD-10-CM | POA: Diagnosis not present

## 2020-11-11 DIAGNOSIS — M19072 Primary osteoarthritis, left ankle and foot: Secondary | ICD-10-CM | POA: Diagnosis not present

## 2020-11-11 DIAGNOSIS — Z66 Do not resuscitate: Secondary | ICD-10-CM | POA: Diagnosis present

## 2020-11-11 DIAGNOSIS — E11621 Type 2 diabetes mellitus with foot ulcer: Secondary | ICD-10-CM | POA: Diagnosis present

## 2020-11-11 DIAGNOSIS — I10 Essential (primary) hypertension: Secondary | ICD-10-CM | POA: Diagnosis not present

## 2020-11-11 DIAGNOSIS — G8929 Other chronic pain: Secondary | ICD-10-CM | POA: Diagnosis present

## 2020-11-11 DIAGNOSIS — B952 Enterococcus as the cause of diseases classified elsewhere: Secondary | ICD-10-CM

## 2020-11-11 DIAGNOSIS — Z87891 Personal history of nicotine dependence: Secondary | ICD-10-CM

## 2020-11-11 DIAGNOSIS — K760 Fatty (change of) liver, not elsewhere classified: Secondary | ICD-10-CM | POA: Diagnosis present

## 2020-11-11 DIAGNOSIS — Z7982 Long term (current) use of aspirin: Secondary | ICD-10-CM

## 2020-11-11 DIAGNOSIS — L97423 Non-pressure chronic ulcer of left heel and midfoot with necrosis of muscle: Secondary | ICD-10-CM

## 2020-11-11 DIAGNOSIS — L02211 Cutaneous abscess of abdominal wall: Secondary | ICD-10-CM | POA: Diagnosis not present

## 2020-11-11 DIAGNOSIS — L97509 Non-pressure chronic ulcer of other part of unspecified foot with unspecified severity: Secondary | ICD-10-CM | POA: Diagnosis present

## 2020-11-11 DIAGNOSIS — Z1152 Encounter for screening for COVID-19: Secondary | ICD-10-CM

## 2020-11-11 DIAGNOSIS — L851 Acquired keratosis [keratoderma] palmaris et plantaris: Secondary | ICD-10-CM | POA: Diagnosis not present

## 2020-11-11 DIAGNOSIS — L89620 Pressure ulcer of left heel, unstageable: Secondary | ICD-10-CM | POA: Diagnosis not present

## 2020-11-11 DIAGNOSIS — E1165 Type 2 diabetes mellitus with hyperglycemia: Secondary | ICD-10-CM | POA: Diagnosis not present

## 2020-11-11 DIAGNOSIS — Z20822 Contact with and (suspected) exposure to covid-19: Secondary | ICD-10-CM | POA: Diagnosis present

## 2020-11-11 DIAGNOSIS — I70245 Atherosclerosis of native arteries of left leg with ulceration of other part of foot: Secondary | ICD-10-CM | POA: Diagnosis not present

## 2020-11-11 DIAGNOSIS — E782 Mixed hyperlipidemia: Secondary | ICD-10-CM | POA: Diagnosis not present

## 2020-11-11 DIAGNOSIS — A4181 Sepsis due to Enterococcus: Principal | ICD-10-CM | POA: Diagnosis present

## 2020-11-11 DIAGNOSIS — J439 Emphysema, unspecified: Secondary | ICD-10-CM | POA: Diagnosis not present

## 2020-11-11 DIAGNOSIS — Z833 Family history of diabetes mellitus: Secondary | ICD-10-CM

## 2020-11-11 DIAGNOSIS — T8189XA Other complications of procedures, not elsewhere classified, initial encounter: Secondary | ICD-10-CM | POA: Diagnosis not present

## 2020-11-11 DIAGNOSIS — Z794 Long term (current) use of insulin: Secondary | ICD-10-CM | POA: Diagnosis not present

## 2020-11-11 DIAGNOSIS — Z7951 Long term (current) use of inhaled steroids: Secondary | ICD-10-CM | POA: Diagnosis not present

## 2020-11-11 DIAGNOSIS — S91102A Unspecified open wound of left great toe without damage to nail, initial encounter: Secondary | ICD-10-CM | POA: Diagnosis not present

## 2020-11-11 DIAGNOSIS — K219 Gastro-esophageal reflux disease without esophagitis: Secondary | ICD-10-CM | POA: Diagnosis present

## 2020-11-11 DIAGNOSIS — D6489 Other specified anemias: Secondary | ICD-10-CM | POA: Diagnosis present

## 2020-11-11 DIAGNOSIS — L97919 Non-pressure chronic ulcer of unspecified part of right lower leg with unspecified severity: Secondary | ICD-10-CM | POA: Diagnosis present

## 2020-11-11 DIAGNOSIS — I110 Hypertensive heart disease with heart failure: Secondary | ICD-10-CM | POA: Diagnosis not present

## 2020-11-11 DIAGNOSIS — J90 Pleural effusion, not elsewhere classified: Secondary | ICD-10-CM | POA: Diagnosis not present

## 2020-11-11 DIAGNOSIS — L02612 Cutaneous abscess of left foot: Secondary | ICD-10-CM | POA: Diagnosis not present

## 2020-11-11 DIAGNOSIS — Z79899 Other long term (current) drug therapy: Secondary | ICD-10-CM | POA: Diagnosis not present

## 2020-11-11 DIAGNOSIS — I959 Hypotension, unspecified: Secondary | ICD-10-CM | POA: Diagnosis not present

## 2020-11-11 DIAGNOSIS — A419 Sepsis, unspecified organism: Secondary | ICD-10-CM | POA: Diagnosis present

## 2020-11-11 DIAGNOSIS — M869 Osteomyelitis, unspecified: Secondary | ICD-10-CM | POA: Diagnosis not present

## 2020-11-11 DIAGNOSIS — M19011 Primary osteoarthritis, right shoulder: Secondary | ICD-10-CM

## 2020-11-11 DIAGNOSIS — M79605 Pain in left leg: Secondary | ICD-10-CM | POA: Diagnosis not present

## 2020-11-11 DIAGNOSIS — M4802 Spinal stenosis, cervical region: Secondary | ICD-10-CM | POA: Diagnosis not present

## 2020-11-11 DIAGNOSIS — E1151 Type 2 diabetes mellitus with diabetic peripheral angiopathy without gangrene: Secondary | ICD-10-CM | POA: Diagnosis present

## 2020-11-11 DIAGNOSIS — E11319 Type 2 diabetes mellitus with unspecified diabetic retinopathy without macular edema: Secondary | ICD-10-CM | POA: Diagnosis present

## 2020-11-11 DIAGNOSIS — N433 Hydrocele, unspecified: Secondary | ICD-10-CM | POA: Diagnosis not present

## 2020-11-11 DIAGNOSIS — Z7984 Long term (current) use of oral hypoglycemic drugs: Secondary | ICD-10-CM | POA: Diagnosis not present

## 2020-11-11 DIAGNOSIS — L97429 Non-pressure chronic ulcer of left heel and midfoot with unspecified severity: Secondary | ICD-10-CM | POA: Diagnosis present

## 2020-11-11 DIAGNOSIS — B9561 Methicillin susceptible Staphylococcus aureus infection as the cause of diseases classified elsewhere: Secondary | ICD-10-CM | POA: Diagnosis not present

## 2020-11-11 DIAGNOSIS — M150 Primary generalized (osteo)arthritis: Secondary | ICD-10-CM | POA: Diagnosis not present

## 2020-11-11 DIAGNOSIS — E1142 Type 2 diabetes mellitus with diabetic polyneuropathy: Secondary | ICD-10-CM | POA: Diagnosis not present

## 2020-11-11 DIAGNOSIS — L97422 Non-pressure chronic ulcer of left heel and midfoot with fat layer exposed: Secondary | ICD-10-CM | POA: Diagnosis not present

## 2020-11-11 DIAGNOSIS — I509 Heart failure, unspecified: Secondary | ICD-10-CM

## 2020-11-11 DIAGNOSIS — R0600 Dyspnea, unspecified: Secondary | ICD-10-CM | POA: Diagnosis not present

## 2020-11-11 DIAGNOSIS — E1140 Type 2 diabetes mellitus with diabetic neuropathy, unspecified: Secondary | ICD-10-CM | POA: Diagnosis not present

## 2020-11-11 DIAGNOSIS — L03116 Cellulitis of left lower limb: Secondary | ICD-10-CM | POA: Diagnosis present

## 2020-11-11 DIAGNOSIS — Q7649 Other congenital malformations of spine, not associated with scoliosis: Secondary | ICD-10-CM | POA: Diagnosis not present

## 2020-11-11 DIAGNOSIS — Z8669 Personal history of other diseases of the nervous system and sense organs: Secondary | ICD-10-CM | POA: Diagnosis not present

## 2020-11-11 DIAGNOSIS — M48061 Spinal stenosis, lumbar region without neurogenic claudication: Secondary | ICD-10-CM | POA: Diagnosis not present

## 2020-11-11 DIAGNOSIS — R7881 Bacteremia: Secondary | ICD-10-CM | POA: Diagnosis not present

## 2020-11-11 DIAGNOSIS — M79662 Pain in left lower leg: Secondary | ICD-10-CM

## 2020-11-11 DIAGNOSIS — Z89422 Acquired absence of other left toe(s): Secondary | ICD-10-CM | POA: Diagnosis not present

## 2020-11-11 DIAGNOSIS — J9601 Acute respiratory failure with hypoxia: Secondary | ICD-10-CM

## 2020-11-11 DIAGNOSIS — M7989 Other specified soft tissue disorders: Secondary | ICD-10-CM

## 2020-11-11 DIAGNOSIS — R0602 Shortness of breath: Secondary | ICD-10-CM | POA: Diagnosis not present

## 2020-11-11 DIAGNOSIS — K3189 Other diseases of stomach and duodenum: Secondary | ICD-10-CM | POA: Diagnosis not present

## 2020-11-11 DIAGNOSIS — D1632 Benign neoplasm of short bones of left lower limb: Secondary | ICD-10-CM | POA: Diagnosis not present

## 2020-11-11 DIAGNOSIS — J9811 Atelectasis: Secondary | ICD-10-CM | POA: Diagnosis not present

## 2020-11-11 DIAGNOSIS — I5023 Acute on chronic systolic (congestive) heart failure: Secondary | ICD-10-CM | POA: Diagnosis not present

## 2020-11-11 DIAGNOSIS — S91302A Unspecified open wound, left foot, initial encounter: Secondary | ICD-10-CM | POA: Diagnosis not present

## 2020-11-11 DIAGNOSIS — I428 Other cardiomyopathies: Secondary | ICD-10-CM | POA: Diagnosis not present

## 2020-11-11 DIAGNOSIS — R188 Other ascites: Secondary | ICD-10-CM

## 2020-11-11 DIAGNOSIS — M60872 Other myositis, left ankle and foot: Secondary | ICD-10-CM | POA: Diagnosis not present

## 2020-11-11 DIAGNOSIS — D649 Anemia, unspecified: Secondary | ICD-10-CM | POA: Diagnosis not present

## 2020-11-11 DIAGNOSIS — L089 Local infection of the skin and subcutaneous tissue, unspecified: Secondary | ICD-10-CM | POA: Diagnosis not present

## 2020-11-11 DIAGNOSIS — I739 Peripheral vascular disease, unspecified: Secondary | ICD-10-CM | POA: Diagnosis not present

## 2020-11-11 DIAGNOSIS — J45909 Unspecified asthma, uncomplicated: Secondary | ICD-10-CM | POA: Diagnosis not present

## 2020-11-11 DIAGNOSIS — J449 Chronic obstructive pulmonary disease, unspecified: Secondary | ICD-10-CM | POA: Diagnosis not present

## 2020-11-11 DIAGNOSIS — I330 Acute and subacute infective endocarditis: Secondary | ICD-10-CM | POA: Diagnosis not present

## 2020-11-11 LAB — CBC
HCT: 31.6 % — ABNORMAL LOW (ref 39.0–52.0)
Hemoglobin: 10.4 g/dL — ABNORMAL LOW (ref 13.0–17.0)
MCH: 28.7 pg (ref 26.0–34.0)
MCHC: 32.9 g/dL (ref 30.0–36.0)
MCV: 87.3 fL (ref 80.0–100.0)
Platelets: 191 10*3/uL (ref 150–400)
RBC: 3.62 MIL/uL — ABNORMAL LOW (ref 4.22–5.81)
RDW: 13.2 % (ref 11.5–15.5)
WBC: 8.2 10*3/uL (ref 4.0–10.5)
nRBC: 0 % (ref 0.0–0.2)

## 2020-11-11 LAB — POC SOFIA 2 FLU + SARS ANTIGEN FIA: SARS Coronavirus 2 Ag: NEGATIVE

## 2020-11-11 LAB — TSH: TSH: 1.838 u[IU]/mL (ref 0.350–4.500)

## 2020-11-11 LAB — HIV ANTIBODY (ROUTINE TESTING W REFLEX): HIV Screen 4th Generation wRfx: NONREACTIVE

## 2020-11-11 LAB — GLUCOSE, CAPILLARY: Glucose-Capillary: 173 mg/dL — ABNORMAL HIGH (ref 70–99)

## 2020-11-11 LAB — COMPREHENSIVE METABOLIC PANEL
ALT: 22 U/L (ref 0–44)
AST: 23 U/L (ref 15–41)
Albumin: 2.7 g/dL — ABNORMAL LOW (ref 3.5–5.0)
Alkaline Phosphatase: 56 U/L (ref 38–126)
Anion gap: 9 (ref 5–15)
BUN: 20 mg/dL (ref 8–23)
CO2: 27 mmol/L (ref 22–32)
Calcium: 8.2 mg/dL — ABNORMAL LOW (ref 8.9–10.3)
Chloride: 94 mmol/L — ABNORMAL LOW (ref 98–111)
Creatinine, Ser: 1.05 mg/dL (ref 0.61–1.24)
GFR, Estimated: >60 mL/min (ref >60)
Glucose, Bld: 308 mg/dL — ABNORMAL HIGH (ref 70–99)
Potassium: 4.5 mmol/L (ref 3.5–5.1)
Sodium: 130 mmol/L — ABNORMAL LOW (ref 135–145)
Total Bilirubin: 1.2 mg/dL (ref 0.3–1.2)
Total Protein: 6 g/dL — ABNORMAL LOW (ref 6.5–8.1)

## 2020-11-11 LAB — RESP PANEL BY RT-PCR (FLU A&B, COVID) ARPGX2
Influenza A by PCR: NEGATIVE
Influenza B by PCR: NEGATIVE
SARS Coronavirus 2 by RT PCR: NEGATIVE

## 2020-11-11 LAB — LACTIC ACID, PLASMA
Lactic Acid, Venous: 1.1 mmol/L (ref 0.5–1.9)
Lactic Acid, Venous: 2.1 mmol/L (ref 0.5–1.9)
Lactic Acid, Venous: 1.2 mmol/L (ref 0.5–1.9)

## 2020-11-11 LAB — BRAIN NATRIURETIC PEPTIDE: B Natriuretic Peptide: 1423.5 pg/mL — ABNORMAL HIGH (ref 0.0–100.0)

## 2020-11-11 LAB — TROPONIN I (HIGH SENSITIVITY)
Troponin I (High Sensitivity): 62 ng/L — ABNORMAL HIGH (ref <18)
Troponin I (High Sensitivity): 59 ng/L — ABNORMAL HIGH (ref <18)

## 2020-11-11 LAB — PROCALCITONIN: Procalcitonin: 0.34 ng/mL

## 2020-11-11 IMAGING — DX DG FOOT 2V*L*
2 series · 2 of 2 positions shown · non-contrast
Comparison: No recent.

CLINICAL DATA: Leg pain and swelling.

EXAM:
LEFT FOOT - 2 VIEW

[foot ap]
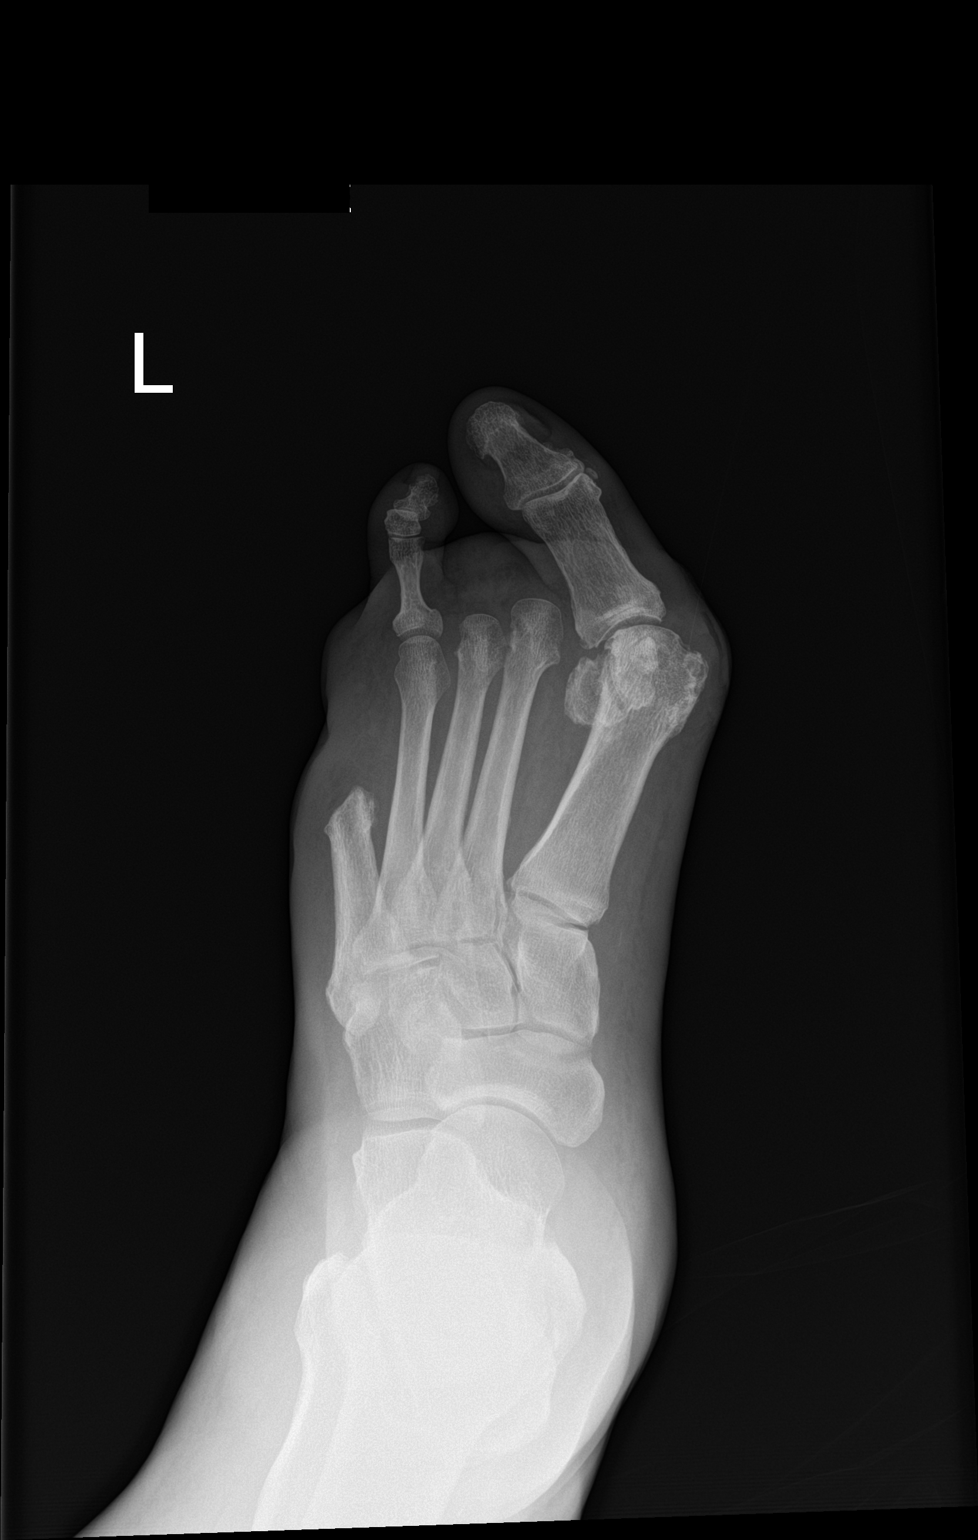

[foot lat]
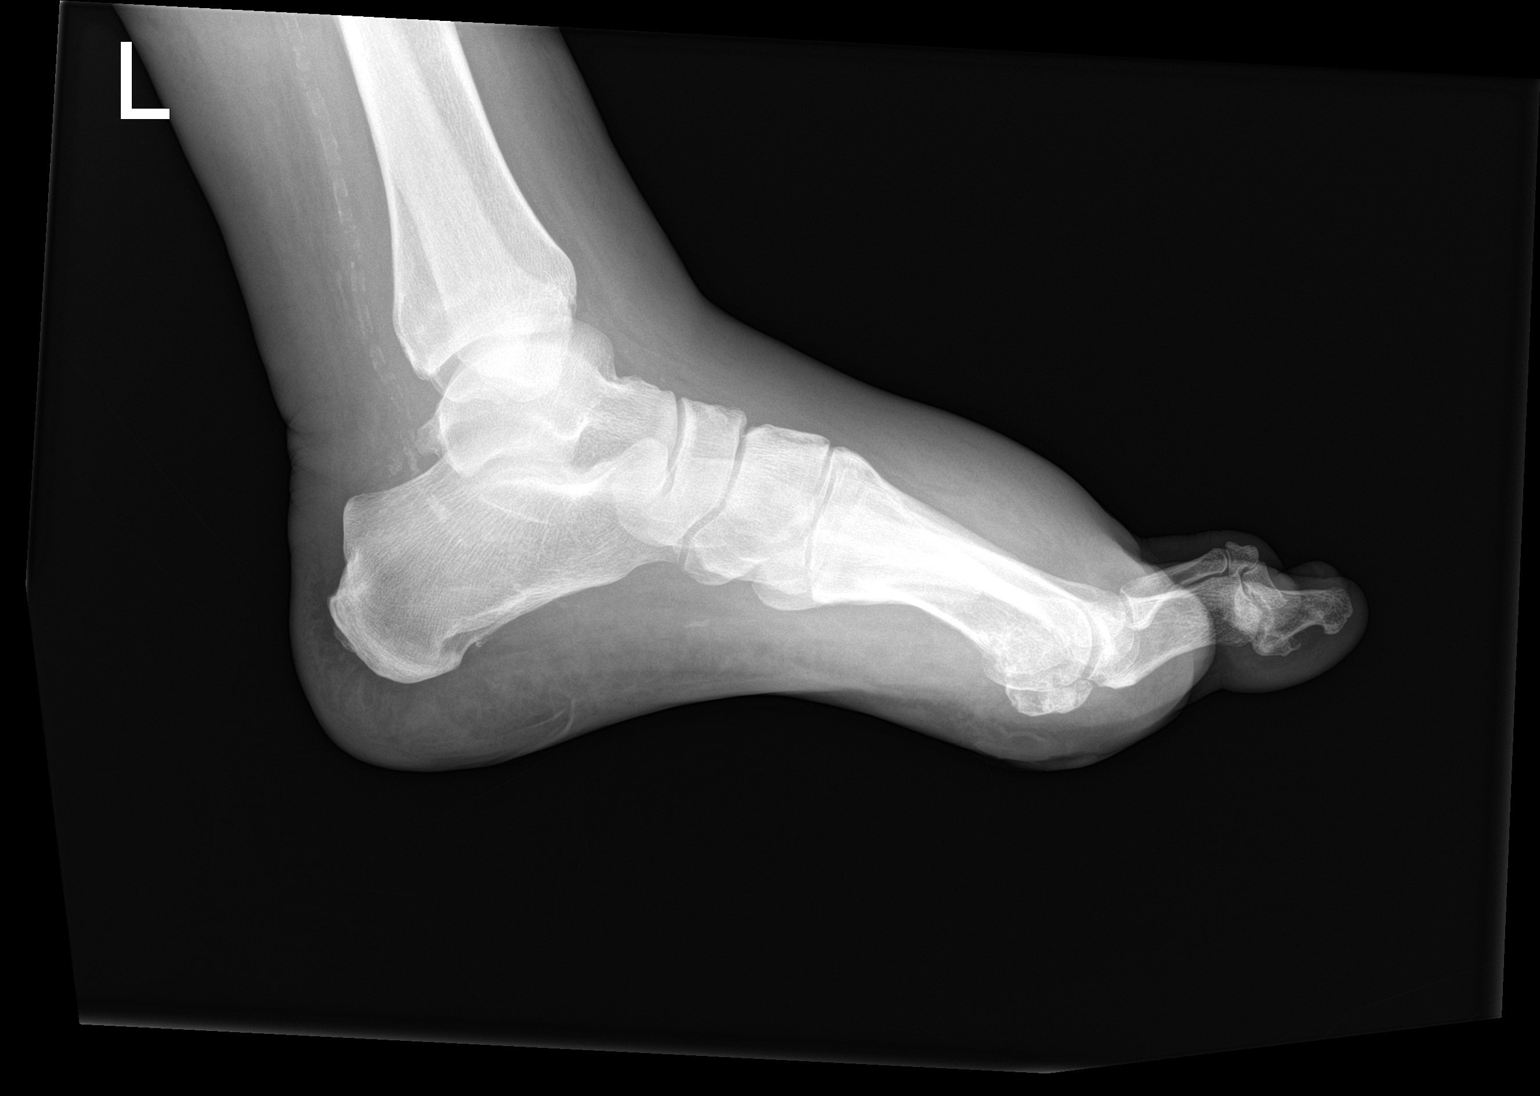

[2 of 2 positions shown; findings below may reference images not displayed]

FINDINGS: Prominent diffuse soft tissue swelling. Soft tissue wound is noted
adjacent to the left great toe and over the heel. No radiopaque
foreign body. Peripheral vascular calcification. Prior amputations
left second, third, and fifth digits. Prominent degenerative change
first MTP joint. No acute bony abnormality identified. No evidence
of fracture or dislocation. No bony erosions noted. If osteomyelitis
is a clinical concern MRI can be obtained.
IMPRESSION: 1. Prominent diffuse soft tissue swelling. Soft tissue wounds noted
adjacent to the left great toe and over the heel. No radiopaque
foreign body. Peripheral vascular disease.
2. Prior amputations left second, third, and fifth digits. Prominent
degenerative change first MTP joint. No acute bony abnormality
identified. No bony erosions noted. If osteomyelitis is suspected
MRI can be obtained.

## 2020-11-11 IMAGING — CR DG CHEST 2V
2 series · 2 of 2 positions shown · non-contrast
Comparison: [DATE]

CLINICAL DATA: Short of breath for 3 weeks, left leg swelling,
hypotensive

EXAM:
CHEST - 2 VIEW

[chest lat]
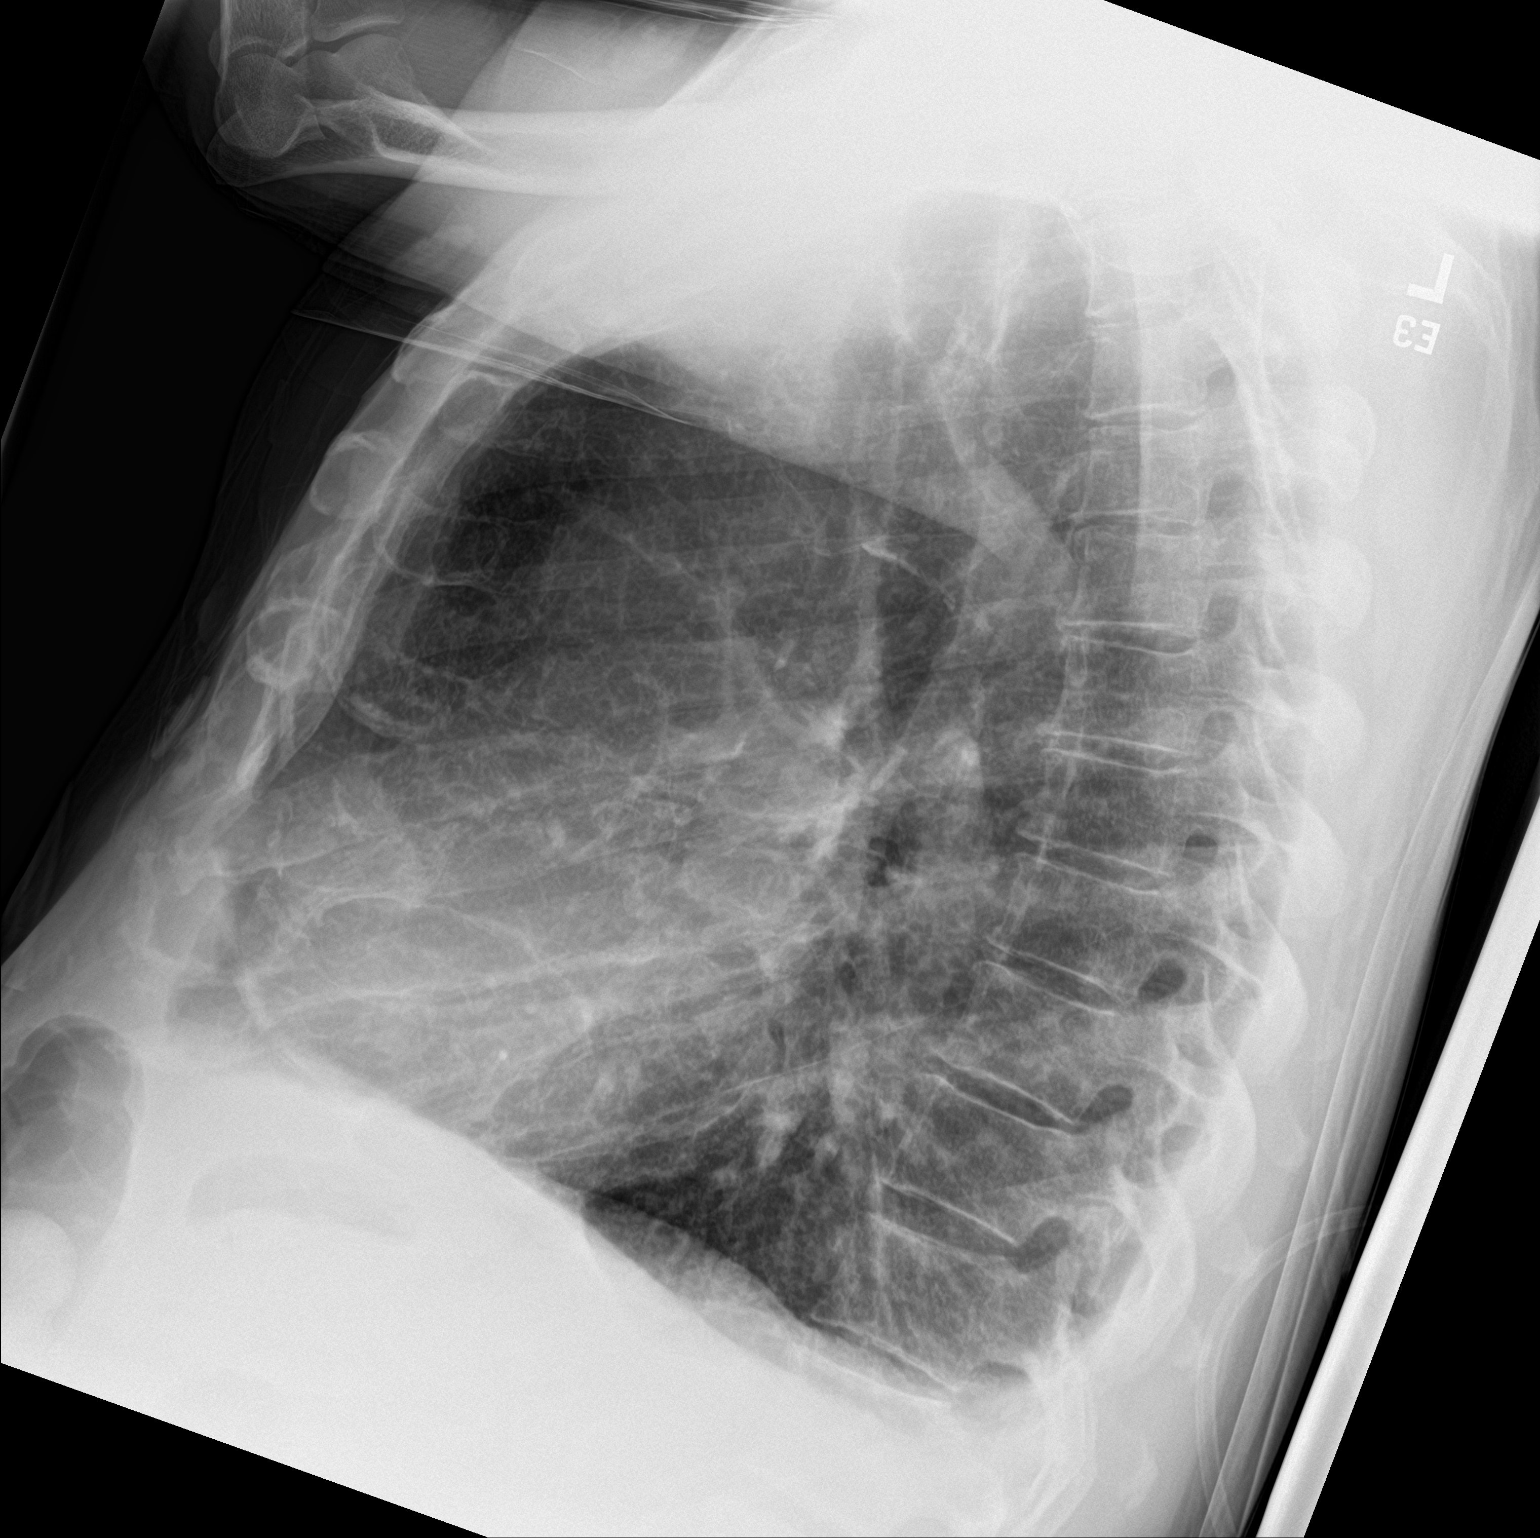

[chest ap]
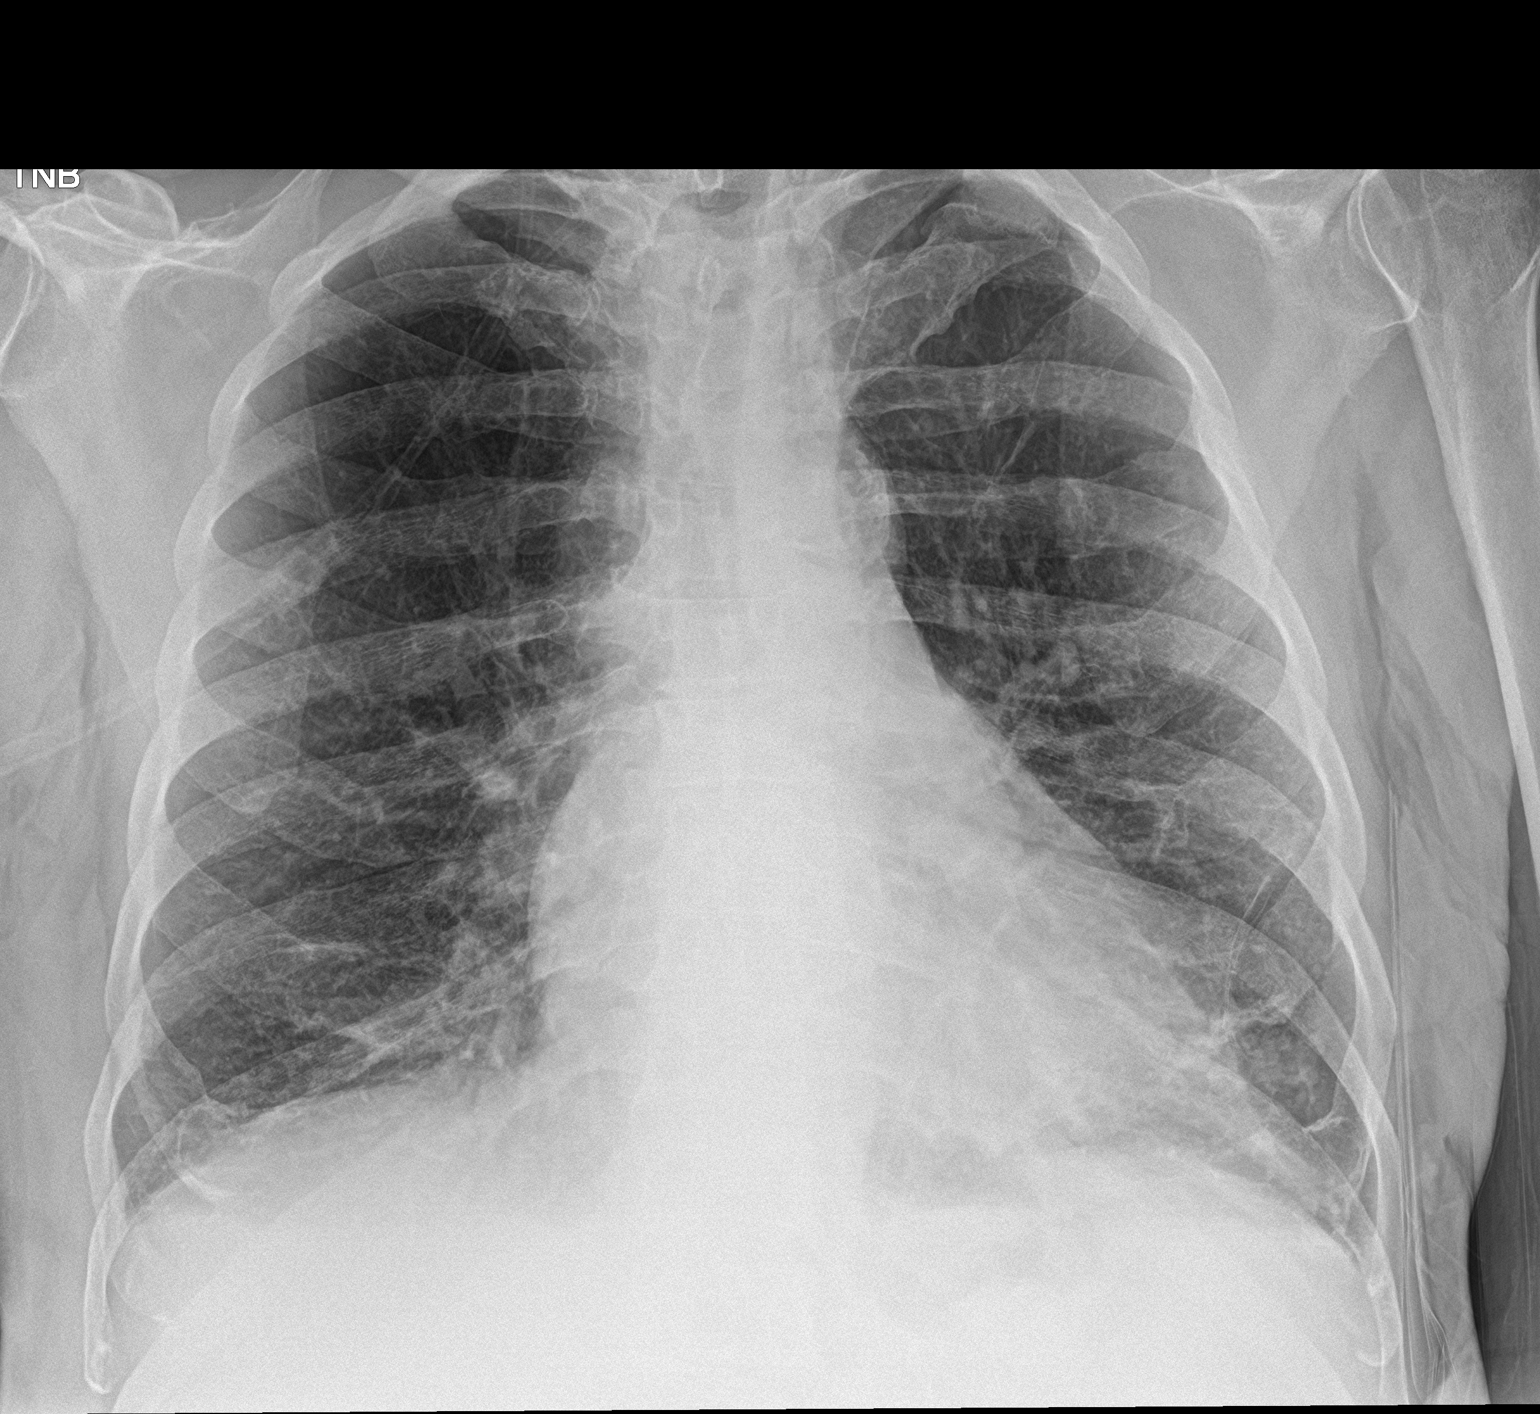

[2 of 2 positions shown; findings below may reference images not displayed]

FINDINGS: Frontal and lateral views of the chest demonstrate an unremarkable
cardiac silhouette. Lungs are hyperinflated with background
interstitial prominence consistent with emphysema. No acute airspace
disease, effusion, or pneumothorax. No acute bony abnormalities.
IMPRESSION: 1. Emphysema, no acute process.

## 2020-11-11 IMAGING — US US EXTREM LOW VENOUS*L*
1 series · 14 of 24 positions shown · non-contrast
Comparison: None.

CLINICAL DATA: Leg pain and swelling.

EXAM:
LEFT LOWER EXTREMITY VENOUS DOPPLER ULTRASOUND
TECHNIQUE: Gray-scale sonography with compression, as well as color and duplex
ultrasound, were performed to evaluate the deep venous system(s)
from the level of the common femoral vein through the popliteal and
proximal calf veins.

[Series 1: us venous img lower uni left (dvt) · portal-venous · 14 of 26 slices shown]
[im 1/26]
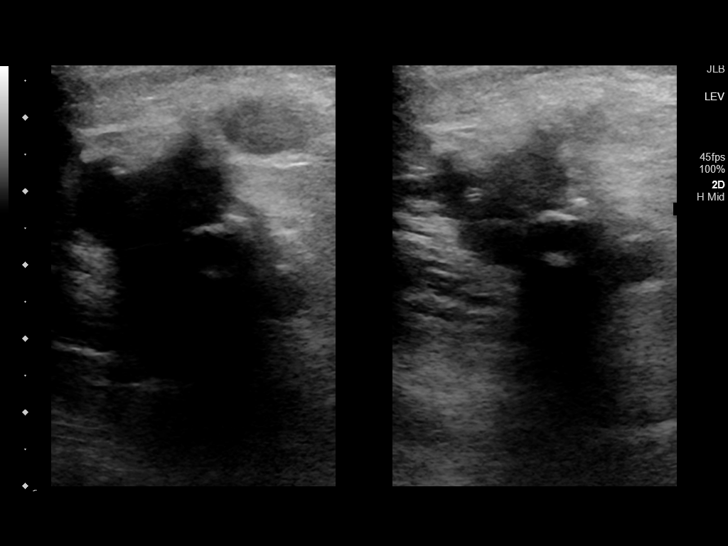
[im 3/26]
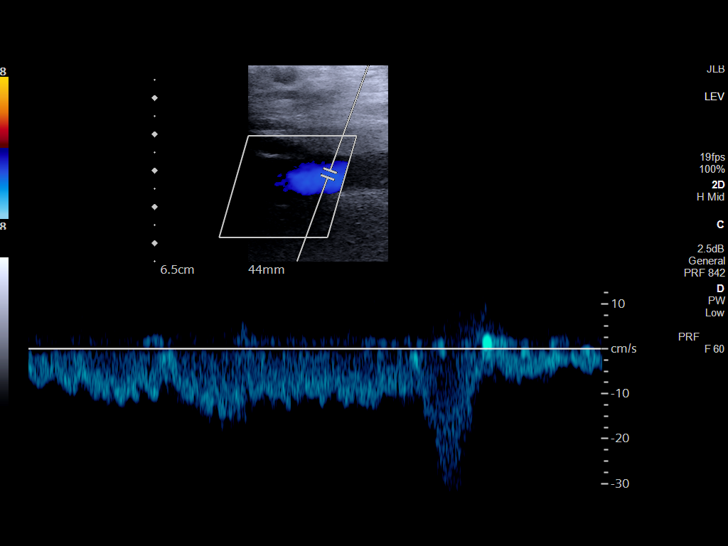
[im 5/26]
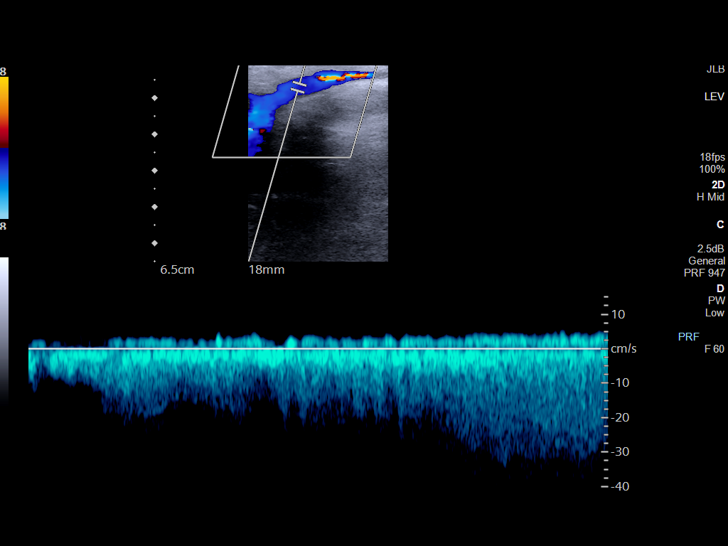
[im 7/26]
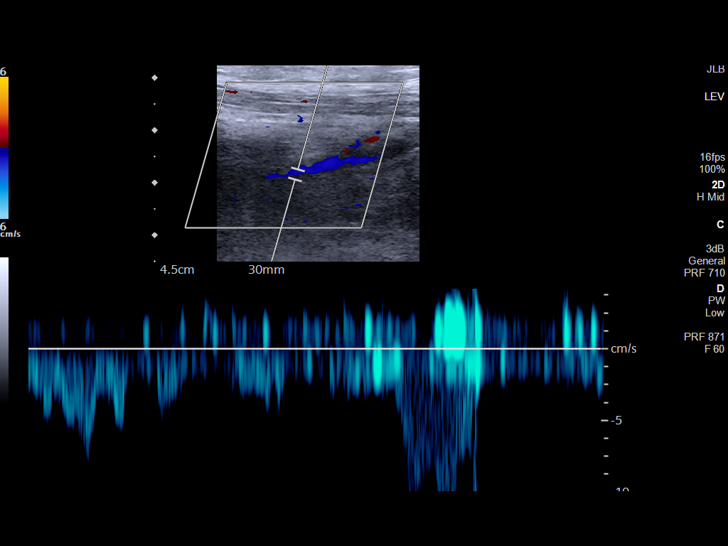
[im 8/26]
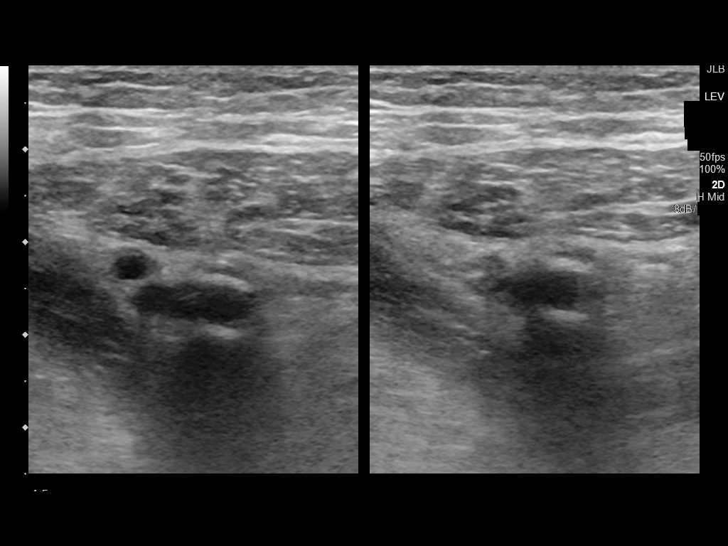
[im 10/26]
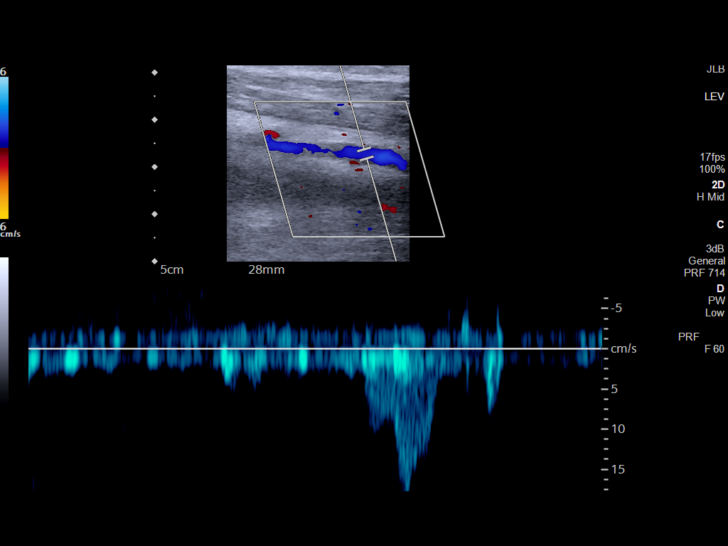
[im 12/26]
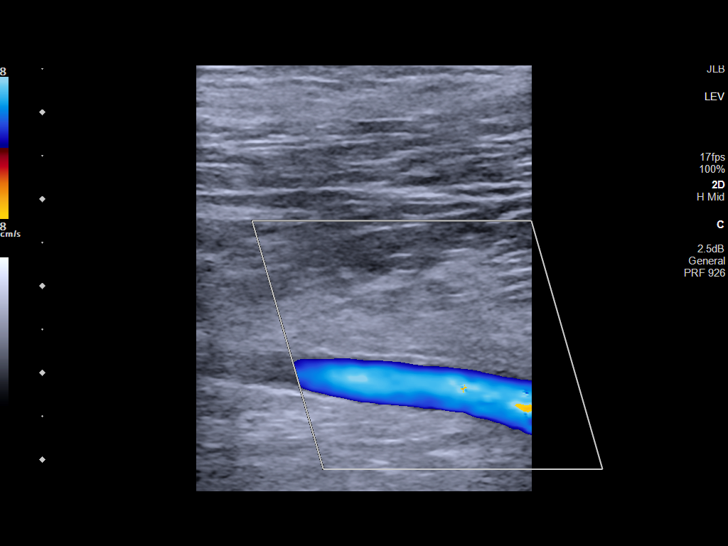
[im 14/26]
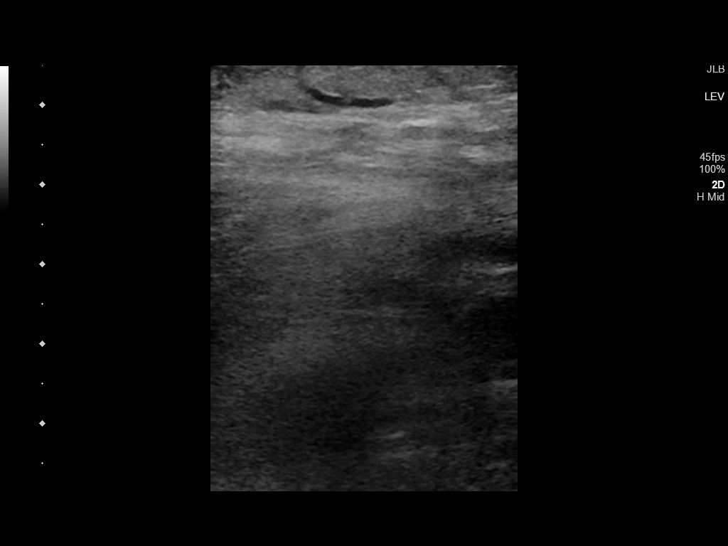
[im 16/26]
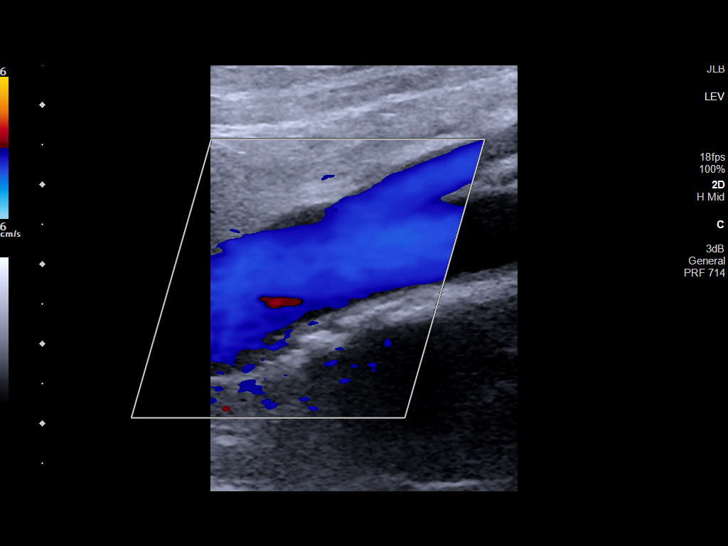
[im 18/26]
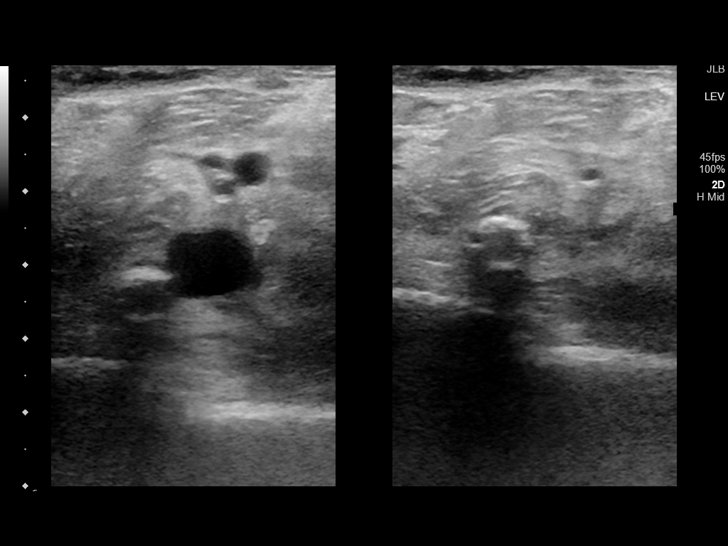
[im 20/26]
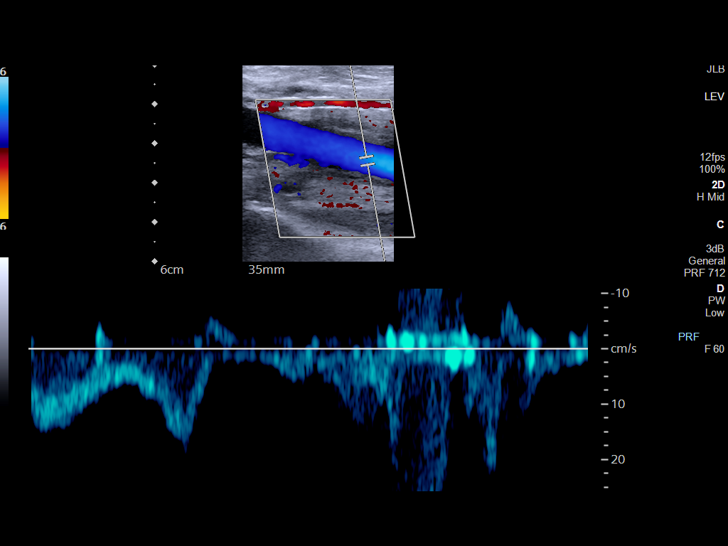
[im 21/26]
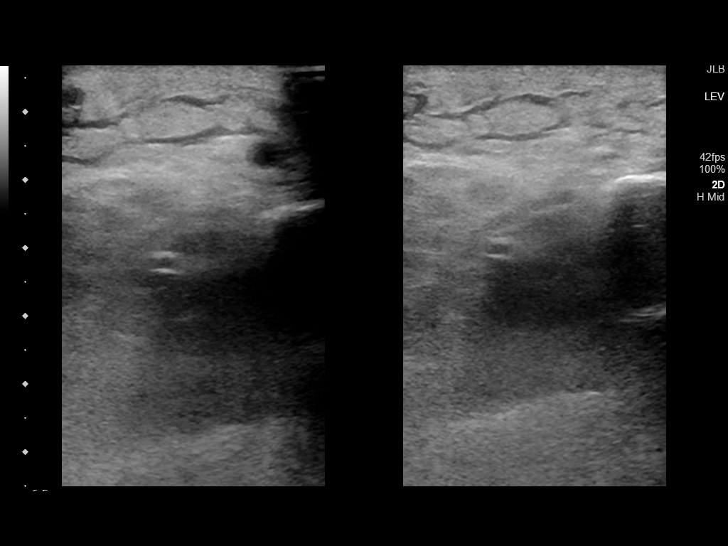
[im 23/26]
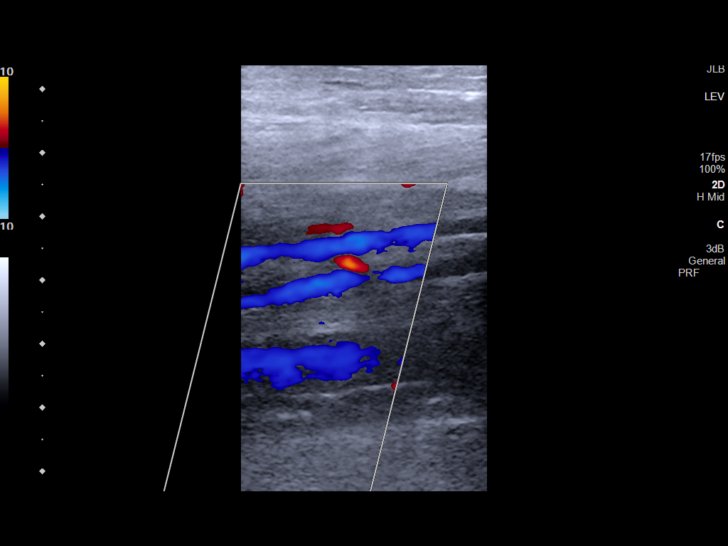
[im 26/26]
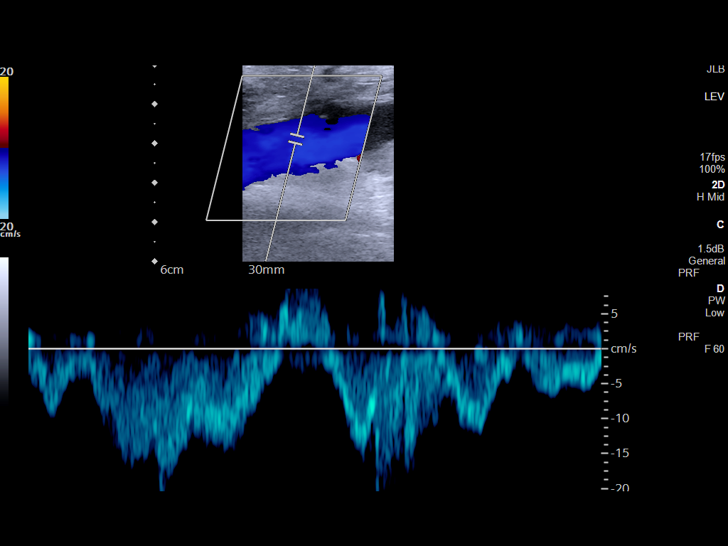

[14 of 24 positions shown; findings below may reference images not displayed]

FINDINGS: VENOUS

Normal compressibility of the common femoral, superficial femoral,
and popliteal veins, as well as the visualized calf veins.
Visualized portions of the great saphenous vein unremarkable. No
filling defects to suggest DVT on grayscale or color Doppler
imaging. Doppler waveforms show normal direction of venous flow,
normal respiratory plasticity and response to augmentation. Profunda
femoral vein not visualized. Peroneal veins not well visualized.

Limited views of the contralateral common femoral vein are
unremarkable.

OTHER

None.
IMPRESSION: No DVT noted as discussed above.

## 2020-11-11 IMAGING — CT CT ANGIO CHEST
2 of 6 series · 19 of 46 positions shown · IV contrast (APPLIED)
Comparison: None.

CLINICAL DATA: Shortness of breath

EXAM:
CT ANGIOGRAPHY CHEST WITH CONTRAST
TECHNIQUE: Multidetector CT imaging of the chest was performed using the
standard protocol during bolus administration of intravenous
contrast. Multiplanar CT image reconstructions and MIPs were
obtained to evaluate the vascular anatomy.
CONTRAST:  100mL OMNIPAQUE IOHEXOL 350 MG/ML SOLN

[Series 5: thins · axial · 0.76mm/px · z∈[-296,-44]mm · 17 of 278 slices shown]
[im 13/278  lung]
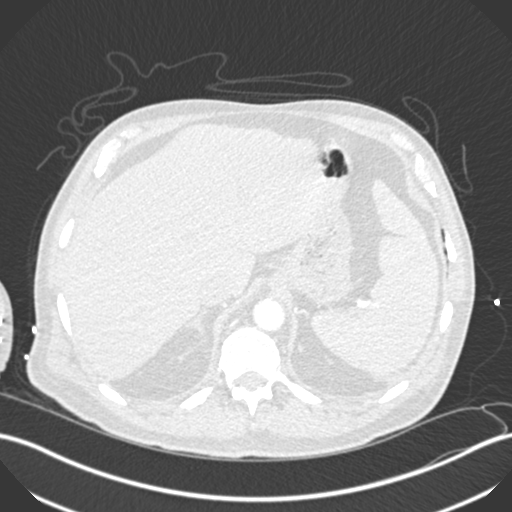
[im 25/278  soft-tissue]
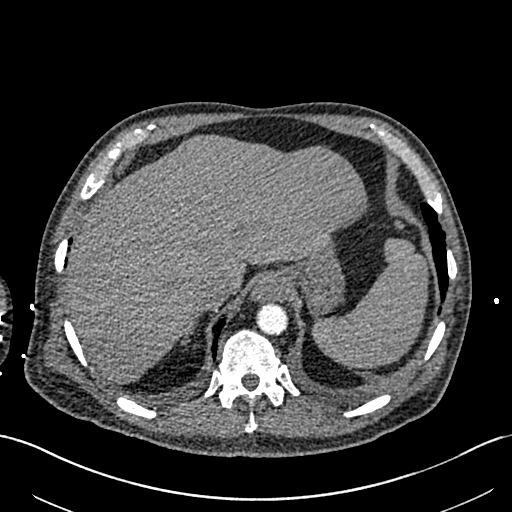
[im 49/278  lung]
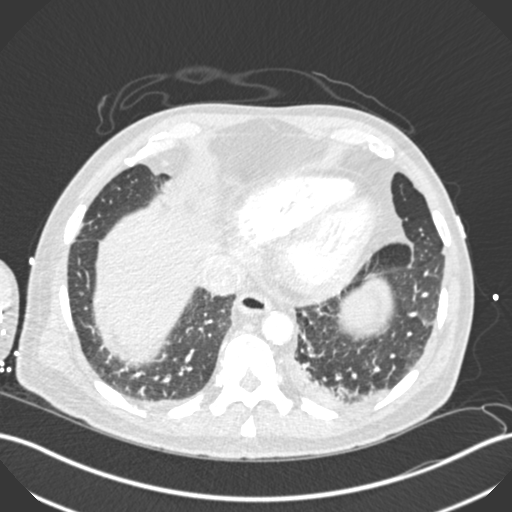
[im 61/278  soft-tissue]
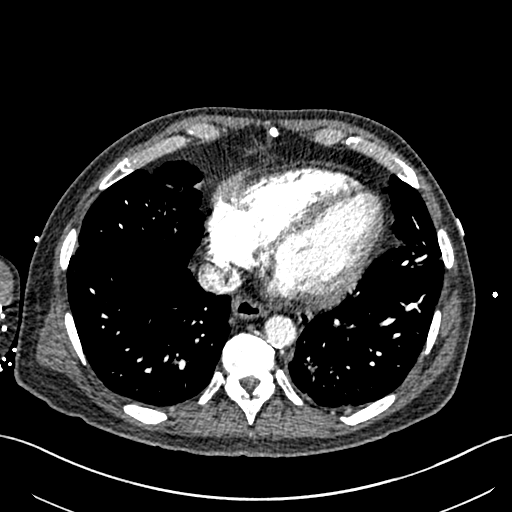
[im 73/278  lung]
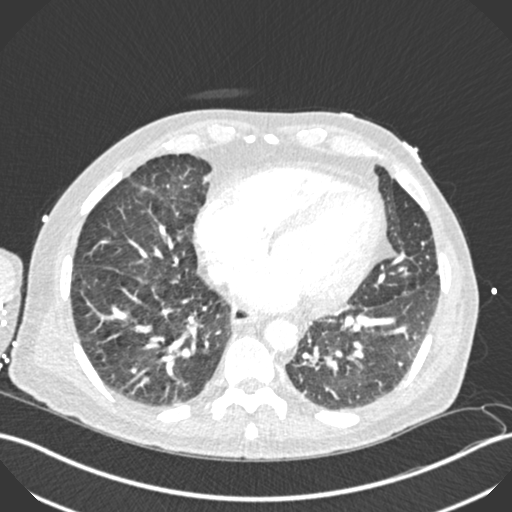
[im 97/278  soft-tissue]
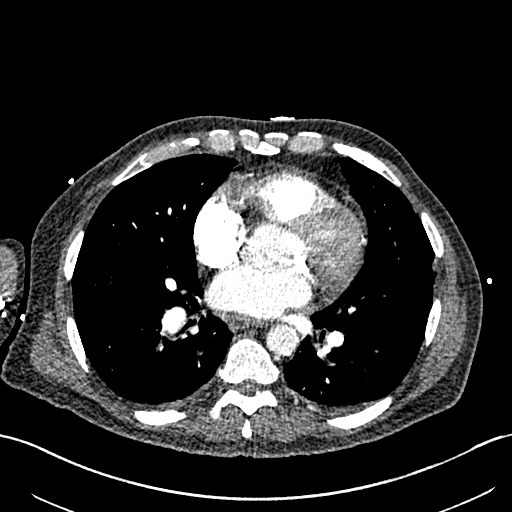
[im 109/278  lung]
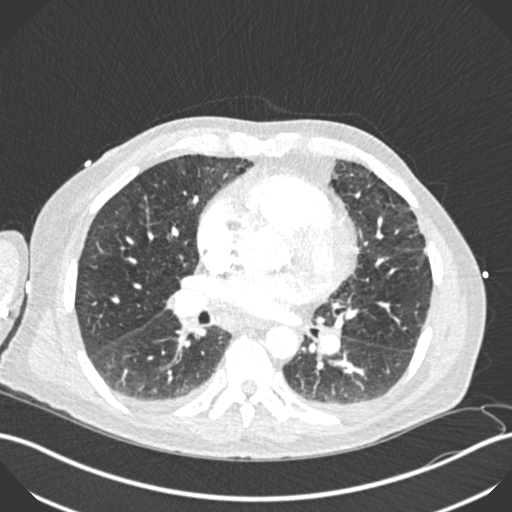
[im 121/278  soft-tissue]
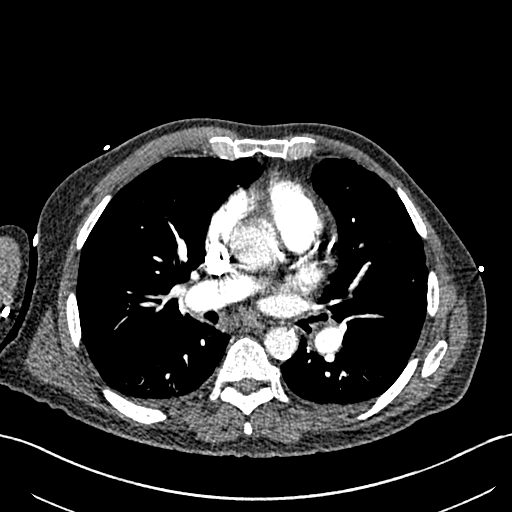
[im 145/278  lung]
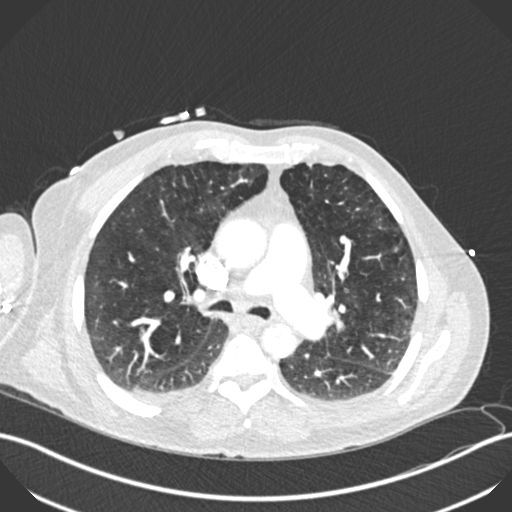
[im 157/278  soft-tissue]
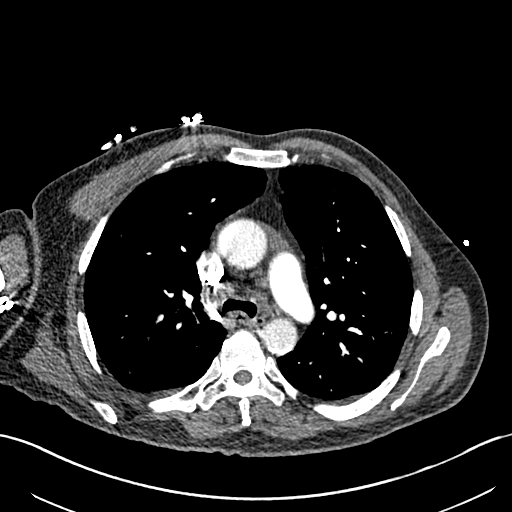
[im 169/278  lung]
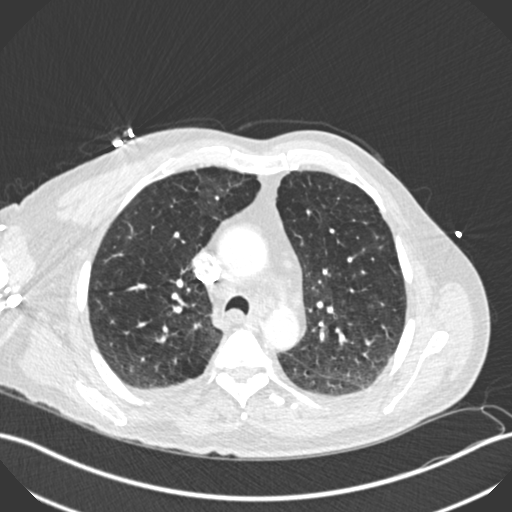
[im 181/278  soft-tissue]
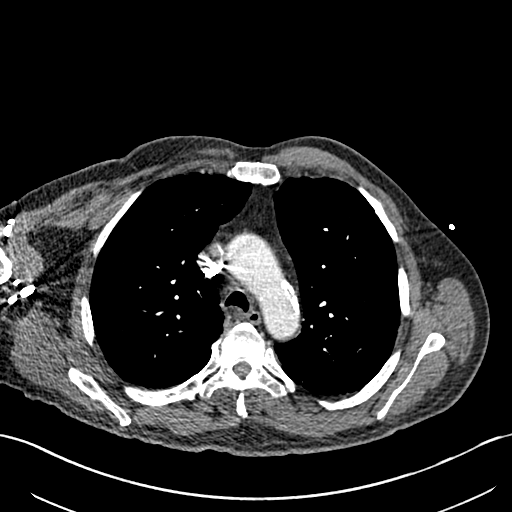
[im 205/278  lung]
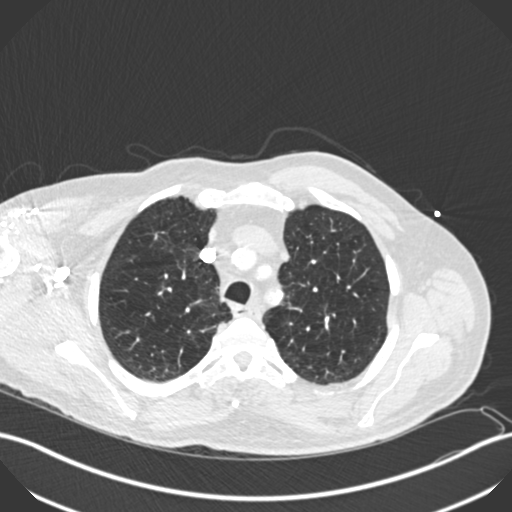
[im 217/278  soft-tissue]
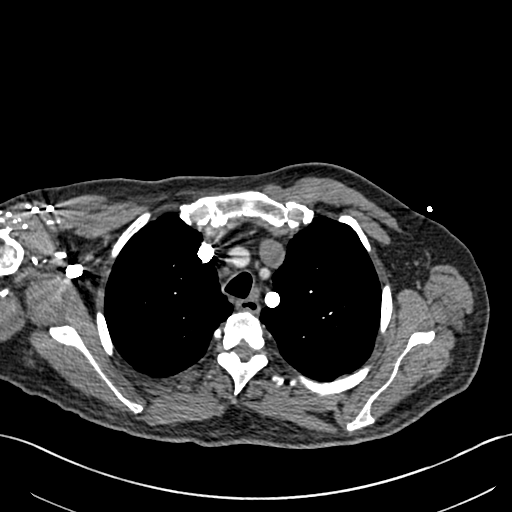
[im 229/278  lung]
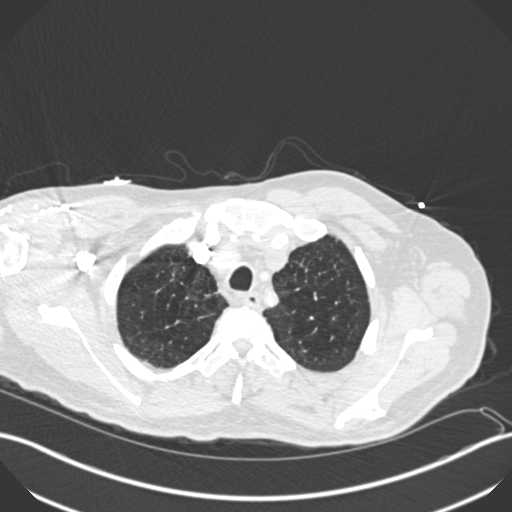
[im 253/278  soft-tissue]
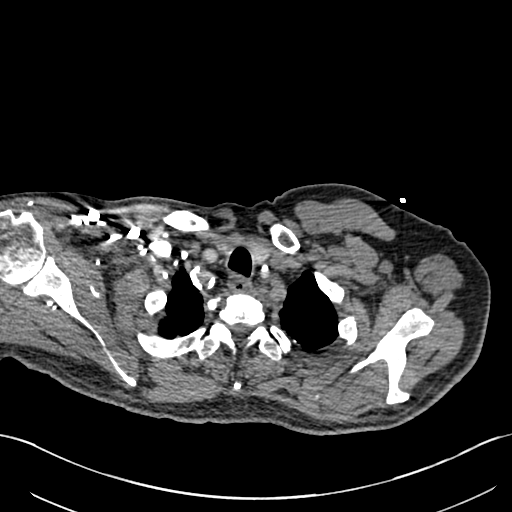
[im 265/278  lung]
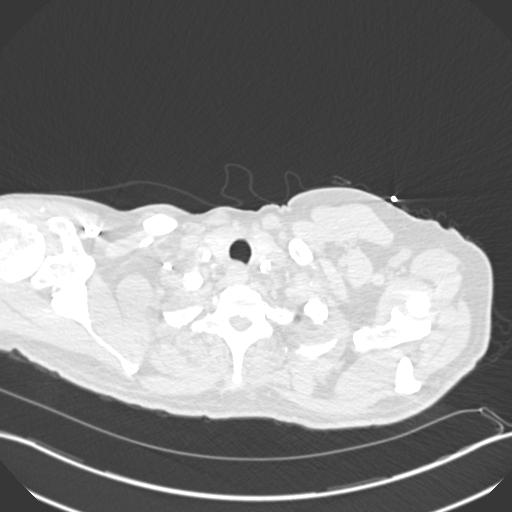

[Series 7: coronal mpr · coronal · 0.54mm/px · 2 of 88 slices shown]
[im 30/88  soft-tissue]
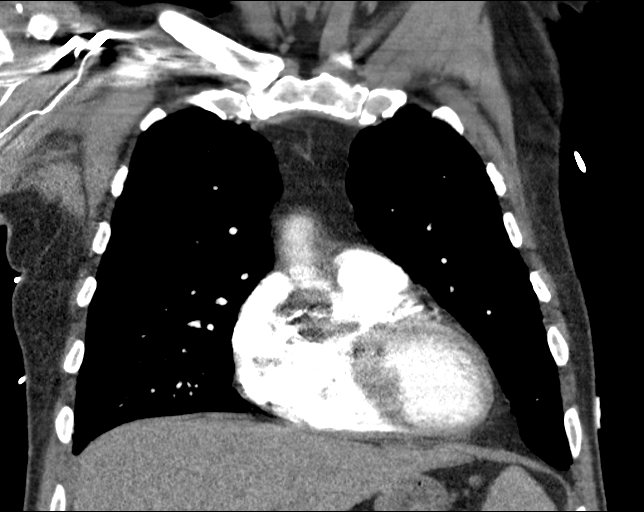
[im 59/88  soft-tissue]
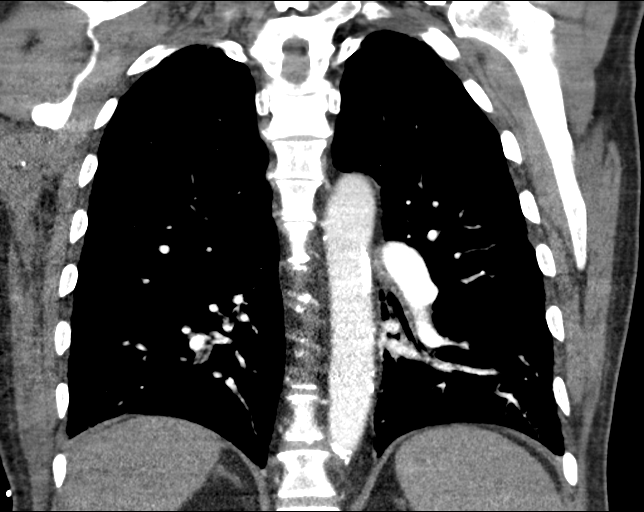

[19 of 46 positions shown; findings below may reference images not displayed]

FINDINGS: Cardiovascular: No filling defects in the pulmonary arteries to
suggest pulmonary emboli. Heart is normal size. Aorta is normal
caliber. Aortic and coronary artery calcifications.

Mediastinum/Nodes: Mildly prominent/enlarged mediastinal lymph
nodes. Left AP window lymph node has a short axis diameter of 14 mm.
Findings similar to prior study. No axillary or hilar adenopathy.

Lungs/Pleura: Trace bilateral pleural effusions. Bibasilar
atelectasis. Emphysema.

Upper Abdomen: Imaging into the upper abdomen demonstrates no acute
findings.

Musculoskeletal: Chest wall soft tissues are unremarkable. No acute
bony abnormality.

Review of the MIP images confirms the above findings.
IMPRESSION: No evidence of pulmonary embolus.

Trace bilateral pleural effusions, bibasilar atelectasis.

Coronary artery disease.

Aortic Atherosclerosis ([D2]-[D2]) and Emphysema ([D2]-[D2]).

## 2020-11-11 MED ORDER — INSULIN ASPART 100 UNIT/ML ~~LOC~~ SOLN
0.0000 [IU] | Freq: Three times a day (TID) | SUBCUTANEOUS | Status: DC
  Administered 2020-11-13 – 2020-11-14 (×2): 2 [IU] via SUBCUTANEOUS
  Administered 2020-11-14 – 2020-11-15 (×2): 3 [IU] via SUBCUTANEOUS
  Administered 2020-11-15 (×2): 2 [IU] via SUBCUTANEOUS
  Administered 2020-11-16 – 2020-11-17 (×4): 3 [IU] via SUBCUTANEOUS
  Administered 2020-11-17: 2 [IU] via SUBCUTANEOUS
  Administered 2020-11-18: 09:00:00 3 [IU] via SUBCUTANEOUS
  Administered 2020-11-18: 13:00:00 2 [IU] via SUBCUTANEOUS
  Administered 2020-11-19: 10:00:00 3 [IU] via SUBCUTANEOUS
  Administered 2020-11-19 (×2): 5 [IU] via SUBCUTANEOUS
  Administered 2020-11-20: 3 [IU] via SUBCUTANEOUS
  Administered 2020-11-20 (×2): 5 [IU] via SUBCUTANEOUS
  Administered 2020-11-21: 2 [IU] via SUBCUTANEOUS
  Administered 2020-11-21: 5 [IU] via SUBCUTANEOUS
  Administered 2020-11-22: 3 [IU] via SUBCUTANEOUS
  Administered 2020-11-22: 5 [IU] via SUBCUTANEOUS
  Administered 2020-11-22: 3 [IU] via SUBCUTANEOUS
  Administered 2020-11-23: 5 [IU] via SUBCUTANEOUS
  Administered 2020-11-23 (×2): 15 [IU] via SUBCUTANEOUS
  Administered 2020-11-24: 5 [IU] via SUBCUTANEOUS
  Administered 2020-11-24: 3 [IU] via SUBCUTANEOUS
  Filled 2020-11-11 (×29): qty 1

## 2020-11-11 MED ORDER — ONDANSETRON HCL 4 MG/2ML IJ SOLN
4.0000 mg | Freq: Four times a day (QID) | INTRAMUSCULAR | Status: DC | PRN
  Administered 2020-11-12: 4 mg via INTRAVENOUS
  Filled 2020-11-11: qty 2

## 2020-11-11 MED ORDER — SODIUM CHLORIDE 0.9 % IV SOLN: INTRAVENOUS | Status: DC

## 2020-11-11 MED ORDER — VANCOMYCIN HCL 750 MG/150ML IV SOLN
750.0000 mg | Freq: Once | INTRAVENOUS | Status: AC
  Administered 2020-11-11: 750 mg via INTRAVENOUS
  Filled 2020-11-11: qty 150

## 2020-11-11 MED ORDER — IOHEXOL 350 MG/ML SOLN
100.0000 mL | Freq: Once | INTRAVENOUS | Status: AC | PRN
  Administered 2020-11-11: 100 mL via INTRAVENOUS

## 2020-11-11 MED ORDER — ACETAMINOPHEN 325 MG PO TABS: 325.0000 mg | ORAL_TABLET | Freq: Four times a day (QID) | ORAL | Status: DC | PRN

## 2020-11-11 MED ORDER — GUAIFENESIN ER 600 MG PO TB12
600.0000 mg | ORAL_TABLET | Freq: Two times a day (BID) | ORAL | Status: AC | PRN
  Administered 2020-11-11: 600 mg via ORAL
  Filled 2020-11-11: qty 1

## 2020-11-11 MED ORDER — MOMETASONE FURO-FORMOTEROL FUM 100-5 MCG/ACT IN AERO
2.0000 | INHALATION_SPRAY | Freq: Two times a day (BID) | RESPIRATORY_TRACT | Status: DC
  Administered 2020-11-12 – 2020-11-24 (×26): 2 via RESPIRATORY_TRACT
  Filled 2020-11-11 (×2): qty 8.8

## 2020-11-11 MED ORDER — LACTATED RINGERS IV BOLUS (SEPSIS): 1000.0000 mL | Freq: Once | INTRAVENOUS | Status: DC

## 2020-11-11 MED ORDER — ACETAMINOPHEN 650 MG RE SUPP: 325.0000 mg | Freq: Four times a day (QID) | RECTAL | Status: DC | PRN

## 2020-11-11 MED ORDER — ONDANSETRON HCL 4 MG PO TABS
4.0000 mg | ORAL_TABLET | Freq: Four times a day (QID) | ORAL | Status: DC | PRN
  Administered 2020-11-12: 4 mg via ORAL
  Filled 2020-11-11 (×2): qty 1

## 2020-11-11 MED ORDER — MORPHINE SULFATE (PF) 2 MG/ML IV SOLN: 2.0000 mg | INTRAVENOUS | Status: AC | PRN

## 2020-11-11 MED ORDER — ASPIRIN EC 81 MG PO TBEC
81.0000 mg | DELAYED_RELEASE_TABLET | Freq: Every day | ORAL | Status: DC
  Administered 2020-11-11 – 2020-11-24 (×12): 81 mg via ORAL
  Filled 2020-11-11 (×12): qty 1

## 2020-11-11 MED ORDER — LACTATED RINGERS IV BOLUS (SEPSIS): 500.0000 mL | Freq: Once | INTRAVENOUS | Status: DC

## 2020-11-11 MED ORDER — SODIUM CHLORIDE 0.9 % IV SOLN
2.0000 g | Freq: Three times a day (TID) | INTRAVENOUS | Status: DC
  Administered 2020-11-11: 2 g via INTRAVENOUS
  Filled 2020-11-11 (×4): qty 2

## 2020-11-11 MED ORDER — VANCOMYCIN HCL 1750 MG/350ML IV SOLN: 1750.0000 mg | INTRAVENOUS | Status: DC

## 2020-11-11 MED ORDER — VANCOMYCIN HCL IN DEXTROSE 1-5 GM/200ML-% IV SOLN
1000.0000 mg | Freq: Once | INTRAVENOUS | Status: AC
  Administered 2020-11-11: 1000 mg via INTRAVENOUS
  Filled 2020-11-11: qty 200

## 2020-11-11 MED ORDER — FLUTICASONE PROPIONATE 50 MCG/ACT NA SUSP
2.0000 | Freq: Every day | NASAL | Status: DC
  Administered 2020-11-12 – 2020-11-24 (×13): 2 via NASAL
  Filled 2020-11-11: qty 16

## 2020-11-11 MED ORDER — METRONIDAZOLE IN NACL 5-0.79 MG/ML-% IV SOLN
500.0000 mg | Freq: Once | INTRAVENOUS | Status: AC
  Administered 2020-11-11: 500 mg via INTRAVENOUS
  Filled 2020-11-11: qty 100

## 2020-11-11 MED ORDER — SODIUM CHLORIDE 0.9 % IV SOLN
2.0000 g | Freq: Once | INTRAVENOUS | Status: AC
  Administered 2020-11-11: 2 g via INTRAVENOUS
  Filled 2020-11-11: qty 2

## 2020-11-11 MED ORDER — CLOPIDOGREL BISULFATE 75 MG PO TABS
75.0000 mg | ORAL_TABLET | Freq: Every day | ORAL | Status: DC
  Administered 2020-11-11 – 2020-11-24 (×13): 75 mg via ORAL
  Filled 2020-11-11 (×13): qty 1

## 2020-11-11 MED ORDER — INSULIN ASPART 100 UNIT/ML ~~LOC~~ SOLN
0.0000 [IU] | Freq: Every day | SUBCUTANEOUS | Status: DC
  Administered 2020-11-23: 2 [IU] via SUBCUTANEOUS
  Filled 2020-11-11: qty 1

## 2020-11-11 MED ORDER — PRAVASTATIN SODIUM 20 MG PO TABS
10.0000 mg | ORAL_TABLET | Freq: Every day | ORAL | Status: DC
  Administered 2020-11-12 – 2020-11-23 (×12): 10 mg via ORAL
  Filled 2020-11-11 (×12): qty 1

## 2020-11-11 MED ORDER — OXYBUTYNIN CHLORIDE 5 MG PO TABS
5.0000 mg | ORAL_TABLET | Freq: Two times a day (BID) | ORAL | Status: DC
  Administered 2020-11-11 – 2020-11-24 (×25): 5 mg via ORAL
  Filled 2020-11-11 (×27): qty 1

## 2020-11-11 MED ORDER — TRAMADOL HCL 50 MG PO TABS
50.0000 mg | ORAL_TABLET | Freq: Two times a day (BID) | ORAL | Status: AC | PRN
  Administered 2020-11-11 – 2020-11-12 (×2): 50 mg via ORAL
  Filled 2020-11-11 (×2): qty 1

## 2020-11-11 MED ORDER — ENOXAPARIN SODIUM 40 MG/0.4ML ~~LOC~~ SOLN
40.0000 mg | SUBCUTANEOUS | Status: DC
  Administered 2020-11-11 – 2020-11-23 (×13): 40 mg via SUBCUTANEOUS
  Filled 2020-11-11 (×14): qty 0.4

## 2020-11-11 MED ORDER — GABAPENTIN 300 MG PO CAPS
300.0000 mg | ORAL_CAPSULE | Freq: Three times a day (TID) | ORAL | Status: DC
  Administered 2020-11-11 – 2020-11-24 (×35): 300 mg via ORAL
  Filled 2020-11-11 (×4): qty 1
  Filled 2020-11-11: qty 3
  Filled 2020-11-11 (×30): qty 1

## 2020-11-11 MED ORDER — LACTATED RINGERS IV BOLUS (SEPSIS)
1000.0000 mL | Freq: Once | INTRAVENOUS | Status: AC
  Administered 2020-11-11: 1000 mL via INTRAVENOUS

## 2020-11-11 NOTE — Progress Notes (Signed)
Pharmacy Antibiotic Note  [name] is a 70 y.o. male admitted on 11/11/2020. Pharmacy has been consulted for vancomycin and cefepime dosing.  Plan: Vancomycin 1750 mg IV q24h  Goal AUC 400-550 Expected AUC: 471 SCr used: 1.05  Cefepime 2 g IV q8hr   Height: 5\' 10"  (177.8 cm) Weight: 82 kg (180 lb 12.4 oz) IBW/kg (Calculated) : 73  Temp (24hrs), Avg:98 F (36.7 C), Min:97.3 F (36.3 C), Max:98.7 F (37.1 C)  Recent Labs  Lab 11/11/20 1513 11/11/20 1638  WBC 8.2  --   CREATININE 1.05  --   LATICACIDVEN 1.1 2.1*    Estimated Creatinine Clearance: 70.5 mL/min (by C-G formula based on SCr of 1.05 mg/dL).    Allergies  Allergen Reactions  . Penicillins Rash and Other (See Comments)    Rash in and around the mouth Has patient had a PCN reaction causing immediate rash, facial/tongue/throat swelling, SOB or lightheadedness with hypotension: Yes Has patient had a PCN reaction causing severe rash involving mucus membranes or skin necrosis: Yes Has patient had a PCN reaction that required hospitalization: No Has patient had a PCN reaction occurring within the last 10 years: Yes If all of the above answers are "NO", then may proceed with Cephalosporin use.   . Bactrim [Sulfamethoxazole-Trimethoprim] Other (See Comments)    Mouth sores, Sores develop in mouth    Antimicrobials this admission: Vancomycin 2/15 >> Cefepime 2/15 >> Metronidazole 2/15 >>  Microbiology results: 2/15 BCx: pending   Thank you for allowing pharmacy to be a part of this patient's care.  Tawnya Crook, PharmD 11/11/2020 6:48 PM

## 2020-11-11 NOTE — Sepsis Progress Note (Signed)
Code Sepsis protocol being followed by [facility]

## 2020-11-11 NOTE — H&P (Addendum)
History and Physical   [name] ZOX:[id number] DOB: 02-28-1951 DOA: 11/11/2020  PCP: [name], MD  Outpatient Specialists: Dr. Vickki Muff (podiatry) and Dr. Saralyn Pilar (cardiology) Patient coming from: home via EMS   I have personally briefly reviewed patient's old medical records in [place].  Chief Concern: Shortness of breath  HPI: [name] is a 70 y.o. male with medical history significant for hypertension, nonischemic cardiomyopathy, non-insulin-dependent diabetes mellitus, peripheral neuropathy, PAD, history of tobacco abuse quit in 1998, presents to the emergency department for chief concerns of shortness of breath.  He endorses weakness and shortness of breath with exertion and increased sleeping and poor PO intake.  He reports this has been ongoing for the last 2 weeks.  He further endorses nausea and diarrhea x 2 weeks.  He reports worsening the in last two days prompting evaluation at PCP. Step daughter at bedside also endorses increased sleeping.  He reports unchanged baseline dry cough.  He denies chest pain, abdominal pain, dysuria, hematuria.  He endorses left lower extremity edema that has been ongoing for the last 2 weeks and worsening in the last 2 days. At PCP, he was advised to present to the ED due to his left lower extremity swelling and shortness of breath.  He reports that he is aware of the diabetic foot ulcer on the medial great toe however is aware of the wound on the left heel and just noticed it today in the ED because it was pointed to him.  He states that he presented to podiatrist approximately 2 to 3 weeks ago and was not aware of this wound.  Social history: quitting smoke in [place] lives with spouse who smokes in doors until December 2021. Denies etoh and recreatioanl drugs. He is retired and worked in a [employer].   Vaccination: Patient is fully vaccinated including booster for COVID-19.  Allergies: Patient is unaware of any life-threatening  drug allergies.  ROS: Constitutional: no weight change, no fever ENT/Mouth: no sore throat, no rhinorrhea Eyes: no eye pain, no vision changes Cardiovascular: no chest pain, + dyspnea,  + edema, no palpitations Respiratory: + cough, no sputum, no wheezing Gastrointestinal: + nausea, + vomiting, + diarrhea, no constipation Genitourinary: no urinary incontinence, no dysuria, no hematuria Musculoskeletal: no arthralgias, no myalgias Skin: no skin lesions, no pruritus, Neuro: + weakness, no loss of consciousness, no syncope Psych: no anxiety, no depression, + decrease appetite Heme/Lymph: no bruising, no bleeding  ED Course: Discussed with ED provider, patient requiring hospitalization due to possible sepsis from diabetic foot wound.  ED vitals initially showed lactic of 1.1, temperature of 97.3 increased to 98.7, respiration rate of 18, heart rate of 108, satting at 97% on room air.  ED labs were remarkable for elevated BNP of 1423, troponins of 62, serum sodium of 130, glucose of 308, hemoglobin of 10.4, hematocrit 31.1, platelets of 191.  On initial EDP evaluation, initial concern was for DVT and/or PE.  Left lower extremity ultrasound was done and showed no DVT.  CTA of the chest was done and showed no pulmonary embolism, bilateral pleural effusions, bibasilar atelectasis.  Assessment/Plan  Principal Problem:   Sepsis ([place]) Active Problems:   PAD (peripheral artery disease) (HCC)   Diabetes ([place])   Mixed hyperlipidemia   Essential hypertension   GERD (gastroesophageal reflux disease)   Uncontrolled type 2 diabetes mellitus with hyperglycemia (HCC)   Cardiomyopathy, nonischemic ([place])   Primary generalized (osteo)arthritis   Moderate asthma without complication   #  Sepsis suspect secondary to diabetic foot wound -Blood cultures x2 -Broad-spectrum antibiotics: Metronidazole, vancomycin, cefepime -Continue cefepime and vancomycin per pharmacy -Patient is maintaining MAP  therefore sepsis bolus is not indicated however I have started patient on LR IVF at 125 mL/h -Podiatry, Dr. Luana Shu, has been consulted via secure chat and he will see patient -Vascular has been consulted -We will continue to trend a third lactic to ensure that patient is responding to treatments   # Peripheral arterial disease-continue DAPT, aspirin 81 mg daily and Plavix 75 mg daily -Vascular has been consulted via secure chat, Dr. Ronalee Belts and plan for angiogram on Friday, 11/14/2020 once patient gets a few days of iv abx   # Chronic pain-tramadol 50 mg every 12 hours as needed for moderate pain has been resumed  # Left leg pain-tramadol 50 mg every 12 hours as needed for moderate pain -Morphine 2 mg IV every 3 hours as needed for severe pain, for 1 day  # Shortness of breath with elevated BNP-suspect secondary to sepsis -However given history of PAD, complete echo has been ordered to assess for valvular function  # Non-insulin-dependent diabetes mellitus-did not resume home sulfonylurea -Insulin SSI with at bedtime coverage  # Peripheral neuropathy-gabapentin 300 mg p.o. 3 times daily resumed  # Hyperlipidemia-pravastatin 10 mg nightly resumed  Chart reviewed.   DVT prophylaxis: Enoxaparin Code Status: DNR  Diet: Heart healthy/carb modified Family Communication: Updated stepdaughter at bedside Disposition Plan: Pending clinical course, and vascular and podiatry evaluation Consults called: Podiatry and vascular Admission status: Inpatient to progressive cardiac  Past Medical History:  Diagnosis Date  . Cholecystitis, chronic   . Cholelithiasis   . COPD (chronic obstructive pulmonary disease) ([place])   . Diabetes mellitus without complication ([place])   . Diabetic retinopathy ([place])   . Dyspnea   . Fatty liver 2008  . Fracture of clavicle 1992   left   . Fracture of ribs, multiple 1992   3 ribs  . GERD (gastroesophageal reflux disease)   . Hyperlipidemia   . Hypertension   .  Paroxysmal SVT (supraventricular tachycardia) ([place])   . Pelvis fracture ([place]) 1992  . Peripheral vascular disease ([place])   . Pleurisy   . Pleurisy   . Seasonal allergies    Past Surgical History:  Procedure Laterality Date  . AMPUTATION TOE Left 02/25/2017   Procedure: AMPUTATION TOE-LEFT 2ND MPJ;  Surgeon: Samara Deist, DPM;  Location: [facility] ORS;  Service: Podiatry;  Laterality: Left;  . AMPUTATION TOE Left 10/20/2018   Procedure: TOE MPJ T2 LEFT;  Surgeon: Samara Deist, DPM;  Location: [facility] ORS;  Service: Podiatry;  Laterality: Left;  . AMPUTATION TOE Left 01/12/2019   Procedure: AMPUTATION LEFT 5TH TOE AND JOINT;  Surgeon: Samara Deist, DPM;  Location: [facility] ORS;  Service: Podiatry;  Laterality: Left;  . BACK SURGERY  2008  . CHOLECYSTECTOMY    . COLONOSCOPY WITH PROPOFOL N/A 08/15/2017   Procedure: COLONOSCOPY WITH PROPOFOL;  Surgeon: Lollie Sails, MD;  Location: [facility] ENDOSCOPY;  Service: Endoscopy;  Laterality: N/A;  . ENDARTERECTOMY Right 05/12/2017   Procedure: ENDARTERECTOMY CAROTID;  Surgeon: [name], MD;  Location: [facility] ORS;  Service: Vascular;  Laterality: Right;  . ENDARTERECTOMY FEMORAL Left 12/01/2018   Procedure: ENDARTERECTOMY FEMORAL;  Surgeon: [name], MD;  Location: [facility] ORS;  Service: Vascular;  Laterality: Left;  . ERCP    . FOOT SURGERY Right    x 3  . LOWER EXTREMITY ANGIOGRAM Left 12/01/2018   Procedure:  LOWER EXTREMITY ANGIOGRAM;  Surgeon: [name], MD;  Location: [facility] ORS;  Service: Vascular;  Laterality: Left;  . LOWER EXTREMITY ANGIOGRAPHY Left 10/30/2018   Procedure: LOWER EXTREMITY ANGIOGRAPHY;  Surgeon: [name], MD;  Location: [facility];  Service: Cardiovascular;  Laterality: Left;  . LOWER EXTREMITY ANGIOGRAPHY Left 11/30/2018   Procedure: LOWER EXTREMITY ANGIOGRAPHY;  Surgeon: [name], MD;  Location: [facility];  Service: Cardiovascular;  Laterality: Left;   Social History:  reports that he quit smoking about  23 years ago. He has never used smokeless tobacco. He reports that he does not drink alcohol and does not use drugs.  Allergies  Allergen Reactions  . Penicillins Rash and Other (See Comments)    Rash in and around the mouth Has patient had a PCN reaction causing immediate rash, facial/tongue/throat swelling, SOB or lightheadedness with hypotension: Yes Has patient had a PCN reaction causing severe rash involving mucus membranes or skin necrosis: Yes Has patient had a PCN reaction that required hospitalization: No Has patient had a PCN reaction occurring within the last 10 years: Yes If all of the above answers are "NO", then may proceed with Cephalosporin use.   . Bactrim [Sulfamethoxazole-Trimethoprim] Other (See Comments)    Mouth sores, Sores develop in mouth   Family History  Problem Relation Age of Onset  . Diabetes Sister    Family history: Family history reviewed and not pertinent  Prior to Admission medications   Medication Sig Start Date End Date Taking? Authorizing Provider  aspirin EC 81 MG tablet Take 81 mg by mouth daily.    [provider]  Calcium Carbonate-Vitamin D (CALCIUM PLUS VITAMIN D PO) Take 1 tablet by mouth daily.    [provider]  clopidogrel (PLAVIX) 75 MG tablet TAKE 1 TABLET BY MOUTH ONCE DAILY 08/06/20   Kris Hartmann, NP  gabapentin (NEURONTIN) 300 MG capsule Take 1 capsule (300 mg total) by mouth 3 (three) times daily. 09/17/20   Ronnell Freshwater, NP  glucose blood (ACCU-CHEK GUIDE) test strip Use as instructed 06/15/19   Ronnell Freshwater, NP  glyBURIDE (DIABETA) 5 MG tablet Take 1 tablet (5 mg total) by mouth 2 (two) times daily with a meal. 08/18/20   Ronnell Freshwater, NP  Lancets (ACCU-CHEK SOFT TOUCH) lancets Use as instructed 06/15/19   Ronnell Freshwater, NP  lovastatin (MEVACOR) 10 MG tablet Take 1 tab  Po QHS 08/05/20   Ronnell Freshwater, NP  meloxicam (MOBIC) 15 MG tablet Take by mouth. 03/09/11   [provider]   mometasone-formoterol (DULERA) 100-5 MCG/ACT AERO Inhale 2 puffs into the lungs 2 (two) times daily as needed for wheezing.    [provider]  Naphazoline-Pheniramine (ALLERGY EYE OP) Place 1 drop into both eyes daily as needed (for allergies).    [provider]  nystatin (MYCOSTATIN) 100000 UNIT/ML suspension Take 5 mLs (500,000 Units total) by mouth 4 (four) times daily. 06/29/19   Ronnell Freshwater, NP  ofloxacin (FLOXIN OTIC) 0.3 % OTIC solution Place 5 drops into both ears 2 (two) times daily. 07/17/20   Ronnell Freshwater, NP  Omega-3 Fatty Acids (FISH OIL PO) Take 1 capsule by mouth daily.    [provider]  ondansetron (ZOFRAN-ODT) 4 MG disintegrating tablet Take 1 tablet (4 mg total) by mouth every 8 (eight) hours as needed for nausea or vomiting. 11/03/20   Luiz Ochoa, NP  oxybutynin (DITROPAN) 5 MG tablet  Take 1 tablet (5 mg total) by mouth 2 (two) times daily. 11/10/20   Luiz Ochoa, NP  pneumococcal 23 valent vaccine (PNEUMOVAX 23) 25 MCG/0.5ML injection Inject 0.25ml IM once 06/20/20   Ronnell Freshwater, NP  traMADol (ULTRAM) 50 MG tablet TAKE 1 TABLET BY MOUTH EVERY 12 HOURS ASNEEDED FOR PAIN 09/22/20   Ronnell Freshwater, NP  vitamin B-12 (CYANOCOBALAMIN) 500 MCG tablet Take 500 mcg by mouth daily.     [provider]   Physical Exam: Vitals:   11/11/20 1545 11/11/20 1600 11/11/20 1733 11/11/20 1734  BP: 97/68 100/78    Pulse: 89 89 95 96  Resp:   17 16  Temp:      TempSrc:      SpO2: 100% 100% 100% 100%  Weight:      Height:       Constitutional: appears older than chronological age, NAD, calm, comfortable Eyes: PERRL, lids and conjunctivae normal ENMT: Mucous membranes are moist. Posterior pharynx clear of any exudate or lesions.  Multiple teeth loss dentition. Hearing appropriate Neck: normal, supple, no masses, no thyromegaly Respiratory: clear to auscultation bilaterally, no wheezing, no crackles. Normal respiratory effort.  No accessory muscle use.  Cardiovascular: Regular rate and rhythm, no murmurs / rubs / gallops. 2+ pitting edema of the left lower extremity. 2+ pedal pulses. No carotid bruits.  Abdomen: Obese abdomen, no tenderness, no masses palpated, no hepatosplenomegaly. Bowel sounds positive.  Musculoskeletal: no clubbing / cyanosis. No joint deformity upper and lower extremities. Good ROM, no contractures, no atrophy. Normal muscle tone.  Skin: chronic bilateral lower extremity vascular skin changes consistent with long term pad Neurologic: Sensation intact. Strength 5/5 in all 4.  Psychiatric: Normal judgment and insight. Alert and oriented x 3. Normal mood.   EKG: independently reviewed, showing sinus rate of 85, QTC 414, inferior lateral ST depressions present  Chest x-ray on Admission: I personally reviewed and I agree with radiologist reading as below.  DG Chest 2 View  Result Date: 11/11/2020 CLINICAL DATA:  Short of breath for 3 weeks, left leg swelling, hypotensive EXAM: CHEST - 2 VIEW COMPARISON:  09/29/2018 FINDINGS: Frontal and lateral views of the chest demonstrate an unremarkable cardiac silhouette. Lungs are hyperinflated with background interstitial prominence consistent with emphysema. No acute airspace disease, effusion, or pneumothorax. No acute bony abnormalities. IMPRESSION: 1. Emphysema, no acute process. Electronically Signed   By: Randa Ngo M.D.   On: 11/11/2020 16:01   CT Angio Chest PE W/Cm &/Or Wo Cm  Result Date: 11/11/2020 CLINICAL DATA:  Shortness of breath EXAM: CT ANGIOGRAPHY CHEST WITH CONTRAST TECHNIQUE: Multidetector CT imaging of the chest was performed using the standard protocol during bolus administration of intravenous contrast. Multiplanar CT image reconstructions and MIPs were obtained to evaluate the vascular anatomy. CONTRAST:  162mL OMNIPAQUE IOHEXOL 350 MG/ML SOLN COMPARISON:  None. FINDINGS: Cardiovascular: No filling defects in the pulmonary arteries to  suggest pulmonary emboli. Heart is normal size. Aorta is normal caliber. Aortic and coronary artery calcifications. Mediastinum/Nodes: Mildly prominent/enlarged mediastinal lymph nodes. Left AP window lymph node has a short axis diameter of 14 mm. Findings similar to prior study. No axillary or hilar adenopathy. Lungs/Pleura: Trace bilateral pleural effusions. Bibasilar atelectasis. Emphysema. Upper Abdomen: Imaging into the upper abdomen demonstrates no acute findings. Musculoskeletal: Chest wall soft tissues are unremarkable. No acute bony abnormality. Review of the MIP images confirms the above findings. IMPRESSION: No evidence of pulmonary embolus. Trace bilateral pleural effusions, bibasilar atelectasis. Coronary artery  disease. Aortic Atherosclerosis (ICD10-I70.0) and Emphysema (ICD10-J43.9). Electronically Signed   By: Rolm Baptise M.D.   On: 11/11/2020 16:55   US Venous Img Lower Unilateral Left  Result Date: 11/11/2020 CLINICAL DATA:  Leg pain and swelling. EXAM: LEFT LOWER EXTREMITY VENOUS DOPPLER ULTRASOUND TECHNIQUE: Gray-scale sonography with compression, as well as color and duplex ultrasound, were performed to evaluate the deep venous system(s) from the level of the common femoral vein through the popliteal and proximal calf veins. COMPARISON:  None. FINDINGS: VENOUS Normal compressibility of the common femoral, superficial femoral, and popliteal veins, as well as the visualized calf veins. Visualized portions of the great saphenous vein unremarkable. No filling defects to suggest DVT on grayscale or color Doppler imaging. Doppler waveforms show normal direction of venous flow, normal respiratory plasticity and response to augmentation. Profunda femoral vein not visualized. Peroneal veins not well visualized. Limited views of the contralateral common femoral vein are unremarkable. OTHER None. IMPRESSION: No DVT noted as discussed above. Electronically Signed   By: [place]   On: 11/11/2020  17:15   DG Foot 2 Views Left  Result Date: 11/11/2020 CLINICAL DATA:  Leg pain and swelling. EXAM: LEFT FOOT - 2 VIEW COMPARISON:  No recent. FINDINGS: Prominent diffuse soft tissue swelling. Soft tissue wound is noted adjacent to the left great toe and over the heel. No radiopaque foreign body. Peripheral vascular calcification. Prior amputations left second, third, and fifth digits. Prominent degenerative change first MTP joint. No acute bony abnormality identified. No evidence of fracture or dislocation. No bony erosions noted. If osteomyelitis is a clinical concern MRI can be obtained. IMPRESSION: 1. Prominent diffuse soft tissue swelling. Soft tissue wounds noted adjacent to the left great toe and over the heel. No radiopaque foreign body. Peripheral vascular disease. 2. Prior amputations left second, third, and fifth digits. Prominent degenerative change first MTP joint. No acute bony abnormality identified. No bony erosions noted. If osteomyelitis is suspected MRI can be obtained. Electronically Signed   By: Marcello Moores  Register   On: 11/11/2020 16:33   Labs on Admission: I have personally reviewed following labs  CBC: Recent Labs  Lab 11/11/20 1513  WBC 8.2  HGB 10.4*  HCT 31.6*  MCV 87.3  PLT 562   Basic Metabolic Panel: Recent Labs  Lab 11/11/20 1513  NA 130*  K 4.5  CL 94*  CO2 27  GLUCOSE 308*  BUN 20  CREATININE 1.05  CALCIUM 8.2*   GFR: Estimated Creatinine Clearance: 70.5 mL/min (by C-G formula based on SCr of 1.05 mg/dL).  Liver Function Tests: Recent Labs  Lab 11/11/20 1513  AST 23  ALT 22  ALKPHOS 56  BILITOT 1.2  PROT 6.0*  ALBUMIN 2.7*   Urine analysis:    Component Value Date/Time   COLORURINE YELLOW (A) 07/11/2015 0831   APPEARANCEUR Clear 06/20/2020 0946   LABSPEC 1.012 07/11/2015 0831   PHURINE 6.0 07/11/2015 0831   GLUCOSEU Negative 06/20/2020 0946   HGBUR 1+ (A) 07/11/2015 0831   BILIRUBINUR Negative 06/20/2020 0946   KETONESUR NEGATIVE  07/11/2015 0831   PROTEINUR Negative 06/20/2020 0946   PROTEINUR NEGATIVE 07/11/2015 0831   NITRITE Negative 06/20/2020 0946   NITRITE NEGATIVE 07/11/2015 0831   LEUKOCYTESUR Negative 06/20/2020 0946   CRITICAL CARE Performed by: Briant Cedar Altan Kraai  Total critical care time: 35 minutes  Critical care time was exclusive of separately billable procedures and treating other patients.  Critical care was necessary to treat or prevent imminent or life-threatening deterioration. Sepsis  Critical care was time spent personally by me on the following activities: development of treatment plan with patient and/or surrogate as well as nursing, discussions with consultants, evaluation of patient's response to treatment, examination of patient, obtaining history from patient or surrogate, ordering and performing treatments and interventions, ordering and review of laboratory studies, ordering and review of radiographic studies, pulse oximetry and re-evaluation of patient's condition.  Wendi Lastra N Jamala Kohen D.O. [facility]  If 7PM-7AM, please contact overnight-coverage provider If 7AM-7PM, please contact day coverage provider [email or web address]  11/11/2020, 6:17 PM

## 2020-11-11 NOTE — ED Provider Notes (Signed)
[facility] Emergency Department Provider Note   ____________________________________________   Event Date/Time   First MD Initiated Contact with Patient 11/11/20 1530     (approximate)  I have reviewed the triage vital signs and the nursing notes.   HISTORY  Chief Complaint Shortness of Breath and Leg Pain    HPI [name] is a 70 y.o. male with past medical history of hypertension, diabetes, hyperlipidemia, PAD, and CHF who presents to the ED complaining of shortness of breath.  Patient ports that he has had increasing difficulty breathing for the past couple of weeks.  He typically notices the difficulty breathing when he is up walking, when he will frequently have to stop and rest.  He denies any pain in his chest, deals with a chronic cough but denies any increase sputum or fevers.  He has also noticed increased pain and swelling in his left lower extremity over the past week.  The leg is red and tender to touch.  He was initially evaluated at his PCPs office, but referred to the ED for further evaluation as he was found to be hypoxic on room air with low blood pressure.        Past Medical History:  Diagnosis Date  . Cholecystitis, chronic   . Cholelithiasis   . COPD (chronic obstructive pulmonary disease) ([place])   . Diabetes mellitus without complication ([place])   . Diabetic retinopathy ([place])   . Dyspnea   . Fatty liver 2008  . Fracture of clavicle 1992   left   . Fracture of ribs, multiple 1992   3 ribs  . GERD (gastroesophageal reflux disease)   . Hyperlipidemia   . Hypertension   . Paroxysmal SVT (supraventricular tachycardia) ([place])   . Pelvis fracture ([place]) 1992  . Peripheral vascular disease ([place])   . Pleurisy   . Pleurisy   . Seasonal allergies     Patient Active Problem List   Diagnosis Date Noted  . Moderate asthma without complication 53/61/4431  . Bilateral impacted cerumen 08/09/2020  . Acute otitis externa of both ears  08/09/2020  . Primary generalized (osteo)arthritis 03/20/2020  . Dysuria 06/15/2019  . Atherosclerosis of artery of extremity with ulceration ([place]) 11/30/2018  . Need for vaccination against Streptococcus pneumoniae using pneumococcal conjugate vaccine 13 10/26/2018  . Overactive bladder 10/26/2018  . Atherosclerosis of native arteries of the extremities with gangrene ([place]) 10/25/2018  . Sore throat 04/03/2018  . Oral candidiasis 04/03/2018  . Uncontrolled type 2 diabetes mellitus with hyperglycemia ([place]) 03/06/2018  . Acute non-recurrent pansinusitis 03/06/2018  . Primary osteoarthritis of shoulders, bilateral 03/06/2018  . GERD (gastroesophageal reflux disease) 11/03/2017  . Carotid stenosis, right 05/12/2017  . Encounter for general adult medical examination with abnormal findings 03/25/2017  . PAD (peripheral artery disease) ([place]) 03/15/2017  . Bilateral carotid artery stenosis 03/15/2017  . Cardiomyopathy, nonischemic ([place]) 07/28/2015  . Chest pain with low risk for cardiac etiology 12/04/2014  . Paroxysmal SVT (supraventricular tachycardia) ([place]) 06/13/2014  . Diabetes ([place]) 06/13/2014  . Mixed hyperlipidemia 06/13/2014  . Essential hypertension 06/13/2014  . Arthritis 06/13/2014  . H/O seasonal allergies 06/13/2014    Past Surgical History:  Procedure Laterality Date  . AMPUTATION TOE Left 02/25/2017   Procedure: AMPUTATION TOE-LEFT 2ND MPJ;  Surgeon: Samara Deist, DPM;  Location: [facility] ORS;  Service: Podiatry;  Laterality: Left;  . AMPUTATION TOE Left 10/20/2018   Procedure: TOE MPJ T2 LEFT;  Surgeon: Samara Deist, DPM;  Location: [facility] ORS;  Service: Podiatry;  Laterality: Left;  . AMPUTATION TOE Left 01/12/2019   Procedure: AMPUTATION LEFT 5TH TOE AND JOINT;  Surgeon: Samara Deist, DPM;  Location: [facility] ORS;  Service: Podiatry;  Laterality: Left;  . BACK SURGERY  2008  . CHOLECYSTECTOMY    . COLONOSCOPY WITH PROPOFOL N/A 08/15/2017   Procedure: COLONOSCOPY WITH  PROPOFOL;  Surgeon: Lollie Sails, MD;  Location: [facility] ENDOSCOPY;  Service: Endoscopy;  Laterality: N/A;  . ENDARTERECTOMY Right 05/12/2017   Procedure: ENDARTERECTOMY CAROTID;  Surgeon: [name], MD;  Location: [facility] ORS;  Service: Vascular;  Laterality: Right;  . ENDARTERECTOMY FEMORAL Left 12/01/2018   Procedure: ENDARTERECTOMY FEMORAL;  Surgeon: [name], MD;  Location: [facility] ORS;  Service: Vascular;  Laterality: Left;  . ERCP    . FOOT SURGERY Right    x 3  . LOWER EXTREMITY ANGIOGRAM Left 12/01/2018   Procedure: LOWER EXTREMITY ANGIOGRAM;  Surgeon: [name], MD;  Location: [facility] ORS;  Service: Vascular;  Laterality: Left;  . LOWER EXTREMITY ANGIOGRAPHY Left 10/30/2018   Procedure: LOWER EXTREMITY ANGIOGRAPHY;  Surgeon: [name], MD;  Location: [place] CV LAB;  Service: Cardiovascular;  Laterality: Left;  . LOWER EXTREMITY ANGIOGRAPHY Left 11/30/2018   Procedure: LOWER EXTREMITY ANGIOGRAPHY;  Surgeon: [name], MD;  Location: [place] [facility];  Service: Cardiovascular;  Laterality: Left;    Prior to Admission medications   Medication Sig Start Date End Date Taking? Authorizing Provider  aspirin EC 81 MG tablet Take 81 mg by mouth daily.    [provider]  Calcium Carbonate-Vitamin D (CALCIUM PLUS VITAMIN D PO) Take 1 tablet by mouth daily.    [provider]  clopidogrel (PLAVIX) 75 MG tablet TAKE 1 TABLET BY MOUTH ONCE DAILY 08/06/20   Kris Hartmann, NP  gabapentin (NEURONTIN) 300 MG capsule Take 1 capsule (300 mg total) by mouth 3 (three) times daily. 09/17/20   Ronnell Freshwater, NP  glucose blood (ACCU-CHEK GUIDE) test strip Use as instructed 06/15/19   Ronnell Freshwater, NP  glyBURIDE (DIABETA) 5 MG tablet Take 1 tablet (5 mg total) by mouth 2 (two) times daily with a meal. 08/18/20   Ronnell Freshwater, NP  Lancets (ACCU-CHEK SOFT TOUCH) lancets Use as instructed 06/15/19   Ronnell Freshwater, NP  lovastatin (MEVACOR) 10 MG tablet Take 1 tab   Po QHS 08/05/20   Ronnell Freshwater, NP  meloxicam (MOBIC) 15 MG tablet Take by mouth. 03/09/11   [provider]  mometasone-formoterol (DULERA) 100-5 MCG/ACT AERO Inhale 2 puffs into the lungs 2 (two) times daily as needed for wheezing.    [provider]  Naphazoline-Pheniramine (ALLERGY EYE OP) Place 1 drop into both eyes daily as needed (for allergies).    [provider]  nystatin (MYCOSTATIN) 100000 UNIT/ML suspension Take 5 mLs (500,000 Units total) by mouth 4 (four) times daily. 06/29/19   Ronnell Freshwater, NP  ofloxacin (FLOXIN OTIC) 0.3 % OTIC solution Place 5 drops into both ears 2 (two) times daily. 07/17/20   Ronnell Freshwater, NP  Omega-3 Fatty Acids (FISH OIL PO) Take 1 capsule by mouth daily.    [provider]  ondansetron (ZOFRAN-ODT) 4 MG disintegrating tablet Take 1 tablet (4 mg total) by mouth every 8 (eight) hours as needed for nausea or vomiting. 11/03/20   Luiz Ochoa, NP  oxybutynin (DITROPAN) 5 MG tablet Take 1 tablet (5 mg total) by mouth 2 (two) times daily.  11/10/20   Luiz Ochoa, NP  pneumococcal 23 valent vaccine (PNEUMOVAX 23) 25 MCG/0.5ML injection Inject 0.43ml IM once 06/20/20   Ronnell Freshwater, NP  traMADol (ULTRAM) 50 MG tablet TAKE 1 TABLET BY MOUTH EVERY 12 HOURS ASNEEDED FOR PAIN 09/22/20   Ronnell Freshwater, NP  vitamin B-12 (CYANOCOBALAMIN) 500 MCG tablet Take 500 mcg by mouth daily.     [provider]    Allergies Penicillins and Bactrim [sulfamethoxazole-trimethoprim]  Family History  Problem Relation Age of Onset  . Diabetes Sister     Social History Social History   Tobacco Use  . Smoking status: Former Smoker    Quit date: 02/24/1997    Years since quitting: 23.7  . Smokeless tobacco: Never Used  Vaping Use  . Vaping Use: Never used  Substance Use Topics  . Alcohol use: No  . Drug use: No    Review of Systems  Constitutional: No fever/chills Eyes: No visual changes. ENT: No sore  throat. Cardiovascular: Denies chest pain. Respiratory: Positive for cough and shortness of breath. Gastrointestinal: No abdominal pain.  No nausea, no vomiting.  No diarrhea.  No constipation. Genitourinary: Negative for dysuria. Musculoskeletal: Negative for back pain.  Positive for leg pain and swelling. Skin: Negative for rash. Neurological: Negative for headaches, focal weakness or numbness.  ____________________________________________   PHYSICAL EXAM:  VITAL SIGNS: ED Triage Vitals  Enc Vitals Group     BP 11/11/20 1453 (!) 84/65     Pulse Rate 11/11/20 1453 (!) 52     Resp 11/11/20 1453 (!) 22     Temp 11/11/20 1453 98.7 F (37.1 C)     Temp Source 11/11/20 1453 Oral     SpO2 11/11/20 1453 91 %     Weight 11/11/20 1452 180 lb 12.4 oz (82 kg)     Height 11/11/20 1452 5\' 10"  (1.778 m)     Head Circumference --      Peak Flow --      Pain Score 11/11/20 1451 10     Pain Loc --      Pain Edu? --      Excl. in [place]? --     Constitutional: Alert and oriented. Eyes: Conjunctivae are normal. Head: Atraumatic. Nose: No congestion/rhinnorhea. Mouth/Throat: Mucous membranes are moist. Neck: Normal ROM Cardiovascular: Normal rate, regular rhythm. Grossly normal heart sounds.  2+ DP pulses bilaterally. Respiratory: Mildly tachypneic with normal respiratory effort.  No retractions. Lungs CTAB. Gastrointestinal: Soft and nontender. No distention. Genitourinary: deferred Musculoskeletal: 2+ pitting edema to left lower extremity up to knee with associated erythema, warmth, and tenderness to palpation.  Ulceration over first MTP and heel with purulent drainage from heel. Neurologic:  Normal speech and language. No gross focal neurologic deficits are appreciated. Skin:  Skin is warm, dry and intact. No rash noted. Psychiatric: Mood and affect are normal. Speech and behavior are normal.  ____________________________________________   LABS (all labs ordered are listed, but only  abnormal results are displayed)  Labs Reviewed  CBC - Abnormal; Notable for the following components:      Result Value   RBC 3.62 (*)    Hemoglobin 10.4 (*)    HCT 31.6 (*)    All other components within normal limits  COMPREHENSIVE METABOLIC PANEL - Abnormal; Notable for the following components:   Sodium 130 (*)    Chloride 94 (*)    Glucose, Bld 308 (*)    Calcium 8.2 (*)    Total Protein  6.0 (*)    Albumin 2.7 (*)    All other components within normal limits  BRAIN NATRIURETIC PEPTIDE - Abnormal; Notable for the following components:   B Natriuretic Peptide 1,423.5 (*)    All other components within normal limits  TROPONIN I (HIGH SENSITIVITY) - Abnormal; Notable for the following components:   Troponin I (High Sensitivity) 62 (*)    All other components within normal limits  CULTURE, BLOOD (ROUTINE X 2)  CULTURE, BLOOD (ROUTINE X 2)  RESP PANEL BY RT-PCR (FLU A&B, COVID) ARPGX2  LACTIC ACID, PLASMA  LACTIC ACID, PLASMA  TROPONIN I (HIGH SENSITIVITY)   ____________________________________________  EKG  ED ECG REPORT I, Blake Divine, the attending physician, personally viewed and interpreted this ECG.   Date: 11/11/2020  EKG Time: 14:49  Rate: 85  Rhythm: normal sinus rhythm  Axis: Normal  Intervals:nonspecific intraventricular conduction delay  ST&T Change: Inferolateral ST depressions   PROCEDURES  Procedure(s) performed (including Critical Care):  Procedures   ____________________________________________   INITIAL IMPRESSION / ASSESSMENT AND PLAN / ED COURSE       70 year old male with past medical history of hypertension, diabetes, hyperlipidemia, PAD, and CHF who presents to the ED for 2 weeks of worsening difficulty breathing, typically with exertion, as well as pain and swelling in his left lower extremity.  He states he did not notice any wounds to his left foot but has 2 large areas of ulceration concerning for diabetic foot infection.  We  will further assess for osteomyelitis with x-ray, also check ultrasound for DVT.  Given symptoms concerning for possible DVT, we will further assess his shortness of breath with CTA of chest.  Patient maintaining O2 sats on room air here and is not in any respiratory distress, but blood pressure is borderline low and we will start hydration with IV fluids.  We will start broad-spectrum antibiotics for diabetic foot infection.  Patient's blood pressure is improved following IV fluid bolus.  CTA chest is negative for PE and ultrasound of left lower extremity shows no evidence of DVT.  Appearance of his left lower extremity seems most consistent with diabetic foot infection and he was started on broad-spectrum antibiotics.  Labs remarkable for elevated BNP and mildly elevated troponin, which seems most consistent with CHF exacerbation rather than ACS.  Patient currently requiring 2 L nasal cannula and case discussed with hospitalist for admission.      ____________________________________________   FINAL CLINICAL IMPRESSION(S) / ED DIAGNOSES  Final diagnoses:  Acute on chronic congestive heart failure, unspecified heart failure type ([place])  Diabetic foot infection [facility])     ED Discharge Orders    None       Note:  This document was prepared using Dragon voice recognition software and may include unintentional dictation errors.   Blake Divine, MD 11/11/20 [phone]

## 2020-11-11 NOTE — Progress Notes (Signed)
[facility] [place], [place] [address]  Internal MEDICINE  Office Visit Note  Patient Name: [name]  [id number]  [id number]  Date of Service: 11/11/2020  Chief Complaint  Patient presents with  . Acute Visit    Trouble breathing for a few weeks and getting worse, pain in back of neck for a couple weeks   . Leg Swelling    Started about 3 to 4 days ago  . Cough    HPI Pt is here in the office for acute and sick visit He has been having problem breathing for last 2 weeks which has worsened over last 2 days. C/o left leg swelling, developed pain and redness. Feels weak and tired, when brought into the office his Oxygen saturation was 81%, was immediately started on 2 L O2 with improved sats of 95%.     BP is low as well, denies any fever or chills. Has been having cough and neck pain    Current Medication: Outpatient Encounter Medications as of 11/11/2020  Medication Sig  . aspirin EC 81 MG tablet Take 81 mg by mouth daily.  . Calcium Carbonate-Vitamin D (CALCIUM PLUS VITAMIN D PO) Take 1 tablet by mouth daily.  . clopidogrel (PLAVIX) 75 MG tablet TAKE 1 TABLET BY MOUTH ONCE DAILY  . gabapentin (NEURONTIN) 300 MG capsule Take 1 capsule (300 mg total) by mouth 3 (three) times daily.  Marland Kitchen glucose blood (ACCU-CHEK GUIDE) test strip Use as instructed  . glyBURIDE (DIABETA) 5 MG tablet Take 1 tablet (5 mg total) by mouth 2 (two) times daily with a meal.  . Lancets (ACCU-CHEK SOFT TOUCH) lancets Use as instructed  . lovastatin (MEVACOR) 10 MG tablet Take 1 tab  Po QHS  . meloxicam (MOBIC) 15 MG tablet Take by mouth.  . mometasone-formoterol (DULERA) 100-5 MCG/ACT AERO Inhale 2 puffs into the lungs 2 (two) times daily as needed for wheezing.  Mable Fill (ALLERGY EYE OP) Place 1 drop into both eyes daily as needed (for allergies).  . nystatin (MYCOSTATIN) 100000 UNIT/ML suspension Take 5 mLs (500,000 Units total) by mouth 4 (four) times daily.   Marland Kitchen ofloxacin (FLOXIN OTIC) 0.3 % OTIC solution Place 5 drops into both ears 2 (two) times daily.  . Omega-3 Fatty Acids (FISH OIL PO) Take 1 capsule by mouth daily.  . ondansetron (ZOFRAN-ODT) 4 MG disintegrating tablet Take 1 tablet (4 mg total) by mouth every 8 (eight) hours as needed for nausea or vomiting.  Marland Kitchen oxybutynin (DITROPAN) 5 MG tablet Take 1 tablet (5 mg total) by mouth 2 (two) times daily.  . pneumococcal 23 valent vaccine (PNEUMOVAX 23) 25 MCG/0.5ML injection Inject 0.12ml IM once  . traMADol (ULTRAM) 50 MG tablet TAKE 1 TABLET BY MOUTH EVERY 12 HOURS ASNEEDED FOR PAIN  . vitamin B-12 (CYANOCOBALAMIN) 500 MCG tablet Take 500 mcg by mouth daily.    No facility-administered encounter medications on file as of 11/11/2020.    Surgical History: Past Surgical History:  Procedure Laterality Date  . AMPUTATION TOE Left 02/25/2017   Procedure: AMPUTATION TOE-LEFT 2ND MPJ;  Surgeon: Samara Deist, DPM;  Location: [facility] ORS;  Service: Podiatry;  Laterality: Left;  . AMPUTATION TOE Left 10/20/2018   Procedure: TOE MPJ T2 LEFT;  Surgeon: Samara Deist, DPM;  Location: [facility] ORS;  Service: Podiatry;  Laterality: Left;  . AMPUTATION TOE Left 01/12/2019   Procedure: AMPUTATION LEFT 5TH TOE AND JOINT;  Surgeon: Samara Deist, DPM;  Location: [facility] ORS;  Service:  Podiatry;  Laterality: Left;  . BACK SURGERY  2008  . CHOLECYSTECTOMY    . COLONOSCOPY WITH PROPOFOL N/A 08/15/2017   Procedure: COLONOSCOPY WITH PROPOFOL;  Surgeon: Lollie Sails, MD;  Location: [facility] ENDOSCOPY;  Service: Endoscopy;  Laterality: N/A;  . ENDARTERECTOMY Right 05/12/2017   Procedure: ENDARTERECTOMY CAROTID;  Surgeon: [name], MD;  Location: [facility] ORS;  Service: Vascular;  Laterality: Right;  . ENDARTERECTOMY FEMORAL Left 12/01/2018   Procedure: ENDARTERECTOMY FEMORAL;  Surgeon: [name], MD;  Location: [facility] ORS;  Service: Vascular;  Laterality: Left;  . ERCP    . FOOT SURGERY Right    x 3  . LOWER EXTREMITY  ANGIOGRAM Left 12/01/2018   Procedure: LOWER EXTREMITY ANGIOGRAM;  Surgeon: [name], MD;  Location: [facility] ORS;  Service: Vascular;  Laterality: Left;  . LOWER EXTREMITY ANGIOGRAPHY Left 10/30/2018   Procedure: LOWER EXTREMITY ANGIOGRAPHY;  Surgeon: [name], MD;  Location: [place] CV LAB;  Service: Cardiovascular;  Laterality: Left;  . LOWER EXTREMITY ANGIOGRAPHY Left 11/30/2018   Procedure: LOWER EXTREMITY ANGIOGRAPHY;  Surgeon: [name], MD;  Location: [place] CV LAB;  Service: Cardiovascular;  Laterality: Left;    Medical History: Past Medical History:  Diagnosis Date  . Cholecystitis, chronic   . Cholelithiasis   . COPD (chronic obstructive pulmonary disease) ([place])   . Diabetes mellitus without complication ([place])   . Diabetic retinopathy ([place])   . Dyspnea   . Fatty liver 2008  . Fracture of clavicle 1992   left   . Fracture of ribs, multiple 1992   3 ribs  . GERD (gastroesophageal reflux disease)   . Hyperlipidemia   . Hypertension   . Paroxysmal SVT (supraventricular tachycardia) ([place])   . Pelvis fracture ([place]) 1992  . Peripheral vascular disease ([place])   . Pleurisy   . Pleurisy   . Seasonal allergies     Family History: Family History  Problem Relation Age of Onset  . Diabetes Sister     Social History   Socioeconomic History  . Marital status: Single    Spouse name: Not on file  . Number of children: Not on file  . Years of education: Not on file  . Highest education level: Not on file  Occupational History  . Not on file  Tobacco Use  . Smoking status: Former Smoker    Quit date: 02/24/1997    Years since quitting: 23.7  . Smokeless tobacco: Never Used  Vaping Use  . Vaping Use: Never used  Substance and Sexual Activity  . Alcohol use: No  . Drug use: No  . Sexual activity: Not on file  Other Topics Concern  . Not on file  Social History Narrative  . Not on file   Social Determinants of Health   Financial Resource Strain: Not  on file  Food Insecurity: Not on file  Transportation Needs: Not on file  Physical Activity: Not on file  Stress: Not on file  Social Connections: Not on file  Intimate Partner Violence: Not on file      Review of Systems  Constitutional: Negative for chills, fatigue and unexpected weight change.  HENT: Positive for postnasal drip. Negative for congestion, rhinorrhea, sneezing and sore throat.   Eyes: Negative for redness.  Respiratory: Positive for cough and shortness of breath. Negative for chest tightness.   Cardiovascular: Positive for leg swelling. Negative for chest pain and palpitations.       Left >> right  Gastrointestinal: Negative for abdominal pain, constipation, diarrhea, nausea and vomiting.  Genitourinary: Negative for dysuria and frequency.  Musculoskeletal: Positive for neck pain. Negative for arthralgias, back pain and joint swelling.  Skin: Positive for rash.  Neurological: Positive for weakness. Negative for tremors and numbness.  Hematological: Negative for adenopathy. Does not bruise/bleed easily.  Psychiatric/Behavioral: Negative for behavioral problems (Depression), sleep disturbance and suicidal ideas. The patient is not nervous/anxious.     Vital Signs: BP (!) 120/56   Pulse 91   Temp (!) 97.3 F (36.3 C)   Resp 16   Ht 5\' 10"  (1.778 m)   Wt 181 lb 6.4 oz (82.3 kg)   SpO2 (!) 81% Comment: Started on O2 in the office ( 2Ln/c) improved Sats 95%  BMI 26.03 kg/m    Physical Exam Constitutional:      Appearance: He is ill-appearing.  HENT:     Head: Normocephalic.     Nose: Nose normal.     Mouth/Throat:     Mouth: Mucous membranes are dry.     Comments: Chapped and dry lips  Eyes:     Extraocular Movements: Extraocular movements intact.     Pupils: Pupils are equal, round, and reactive to light.  Cardiovascular:     Pulses: Normal pulses.  Pulmonary:     Effort: Respiratory distress present.     Breath sounds: Wheezing present.   Musculoskeletal:        General: Swelling and tenderness present.     Cervical back: Normal range of motion.     Left lower leg: Edema present.  Skin:    General: Skin is dry.     Coloration: Skin is pale.     Assessment/Plan: 1. Encounter for screening for COVID-19 - POC SOFIA 2 FLU + SARS ANTIGEN FIA, test is negative   2. Acute respiratory failure with hypoxia (HCC) Pt is started on O2, spoke with triage nurse at [facility] and pt is sent with O2 tank, daughter is comfortable driving him   3. Pain and swelling of left lower leg Cellulitis is present. Pt needs to be evaluated for DVT or arterial thrombosis since does have PVD followed by vascular    4. Hypotension, unspecified hypotension type BP is noted to be low today, ?? Hypovolemic, hold all antihypertensive, pt is sent for further evaluation to ED   General Counseling: Amaro verbalizes understanding of the findings of todays visit and agrees with plan of treatment. I have discussed any further diagnostic evaluation that may be needed or ordered today. We also reviewed his medications today. he has been encouraged to call the office with any questions or concerns that should arise related to todays visit.    Orders Placed This Encounter  Procedures  . POC SOFIA 2 FLU + SARS ANTIGEN FIA     Total time spent:30 Minutes Time spent includes review of chart, medications, test results, and follow up plan with the patient.   [place] Controlled Substance Database was reviewed by me.   Dr [name] Internal medicine

## 2020-11-11 NOTE — Consult Note (Signed)
PHARMACY -  BRIEF ANTIBIOTIC NOTE   Pharmacy has received consult(s) for Vancomcyin/Cefepime from an ED provider.  The patient's profile has been reviewed for ht/wt/allergies/indication/available labs.    One time order(s) placed for Cefepime 2g IV x 1, Vancomycin total loading dose 1750mg  (1g + 750mg )  Further antibiotics/pharmacy consults should be ordered by admitting physician if indicated.                       Thank you,  Lu Duffel, PharmD, BCPS Clinical Pharmacist 11/11/2020 3:52 PM

## 2020-11-11 NOTE — ED Notes (Signed)
Placed chux under patients bilateral feet. Fluid oozing from left foot wound.

## 2020-11-11 NOTE — ED Notes (Signed)
Date and time results received: 11/11/20 1757  Test: Lactic Acid Critical Value: 3.1  Name of Provider Notified: Charna Archer  MD TO FOLLOW UP

## 2020-11-11 NOTE — ED Notes (Signed)
Patient sent to [place] and ct before bloodwork and cultures could be completed hence delay in sepsis treatment.

## 2020-11-11 NOTE — Consult Note (Signed)
[facility] Vascular Consult Note  MRN : [id number]  [name] is a 70 y.o. (12-Oct-1950) male who presents with chief complaint of  Chief Complaint  Patient presents with  . Shortness of Breath  . Leg Pain  .  History of Present Illness:   I have asked to evaluate the patient by Dr. Tobie Poet.  [name] is a 71 year old gentleman who presented to the emergency room with several days of increasing pain swelling and redness of his left lower extremity.  He has also been having some fever and chills as well as some nausea and vomiting.  He noted a new ulcer of the plantar surface below the left great toe and states this has been getting bigger.  He was seen by his PCP who after evaluation insisted he come to the emergency room.  He has a history of diabetic foot infections of the left and has had several toes amputated in the past.  Current Meds  Medication Sig  . aspirin EC 81 MG tablet Take 81 mg by mouth daily.  . Calcium Carbonate-Vitamin D (CALCIUM PLUS VITAMIN D PO) Take 1 tablet by mouth daily.  . clopidogrel (PLAVIX) 75 MG tablet TAKE 1 TABLET BY MOUTH ONCE DAILY  . gabapentin (NEURONTIN) 300 MG capsule Take 1 capsule (300 mg total) by mouth 3 (three) times daily.  Marland Kitchen glyBURIDE (DIABETA) 5 MG tablet Take 1 tablet (5 mg total) by mouth 2 (two) times daily with a meal.  . lovastatin (MEVACOR) 10 MG tablet Take 1 tab  Po QHS  . meloxicam (MOBIC) 15 MG tablet Take by mouth.  . mometasone-formoterol (DULERA) 100-5 MCG/ACT AERO Inhale 2 puffs into the lungs 2 (two) times daily as needed for wheezing.  . ondansetron (ZOFRAN-ODT) 4 MG disintegrating tablet Take 1 tablet (4 mg total) by mouth every 8 (eight) hours as needed for nausea or vomiting.  Marland Kitchen oxybutynin (DITROPAN) 5 MG tablet Take 1 tablet (5 mg total) by mouth 2 (two) times daily.  . traMADol (ULTRAM) 50 MG tablet TAKE 1 TABLET BY MOUTH EVERY 12 HOURS ASNEEDED FOR PAIN    Past Medical History:  Diagnosis  Date  . Cholecystitis, chronic   . Cholelithiasis   . COPD (chronic obstructive pulmonary disease) ([place])   . Diabetes mellitus without complication ([place])   . Diabetic retinopathy ([place])   . Dyspnea   . Fatty liver 2008  . Fracture of clavicle 1992   left   . Fracture of ribs, multiple 1992   3 ribs  . GERD (gastroesophageal reflux disease)   . Hyperlipidemia   . Hypertension   . Paroxysmal SVT (supraventricular tachycardia) ([place])   . Pelvis fracture ([place]) 1992  . Peripheral vascular disease ([place])   . Pleurisy   . Pleurisy   . Seasonal allergies     Past Surgical History:  Procedure Laterality Date  . AMPUTATION TOE Left 02/25/2017   Procedure: AMPUTATION TOE-LEFT 2ND MPJ;  Surgeon: Samara Deist, DPM;  Location: [facility] ORS;  Service: Podiatry;  Laterality: Left;  . AMPUTATION TOE Left 10/20/2018   Procedure: TOE MPJ T2 LEFT;  Surgeon: Samara Deist, DPM;  Location: [facility];  Service: Podiatry;  Laterality: Left;  . AMPUTATION TOE Left 01/12/2019   Procedure: AMPUTATION LEFT 5TH TOE AND JOINT;  Surgeon: Samara Deist, DPM;  Location: [facility] OR[facility];  Service: Podiatry;  Laterality: Left;  . BACK SURGERY  2008  . CHOLECYSTECTOMY    . COLONOSCOPY WITH PROPOFOL N/A 08/15/2017  Procedure: COLONOSCOPY WITH PROPOFOL;  Surgeon: Lollie Sails, MD;  Location: [facility] ENDOSCOPY;  Service: Endoscopy;  Laterality: N/A;  . ENDARTERECTOMY Right 05/12/2017   Procedure: ENDARTERECTOMY CAROTID;  Surgeon: [name], MD;  Location: [facility] ORS;  Service: Vascular;  Laterality: Right;  . ENDARTERECTOMY FEMORAL Left 12/01/2018   Procedure: ENDARTERECTOMY FEMORAL;  Surgeon: [name], MD;  Location: [facility] ORS;  Service: Vascular;  Laterality: Left;  . ERCP    . FOOT SURGERY Right    x 3  . LOWER EXTREMITY ANGIOGRAM Left 12/01/2018   Procedure: LOWER EXTREMITY ANGIOGRAM;  Surgeon: [name], MD;  Location: [facility] ORS;  Service: Vascular;  Laterality: Left;  . LOWER EXTREMITY ANGIOGRAPHY Left 10/30/2018    Procedure: LOWER EXTREMITY ANGIOGRAPHY;  Surgeon: [name], MD;  Location: [place] [facility];  Service: Cardiovascular;  Laterality: Left;  . LOWER EXTREMITY ANGIOGRAPHY Left 11/30/2018   Procedure: LOWER EXTREMITY ANGIOGRAPHY;  Surgeon: [name], MD;  Location: [place] [facility];  Service: Cardiovascular;  Laterality: Left;    Social History Social History   Tobacco Use  . Smoking status: Former Smoker    Quit date: 02/24/1997    Years since quitting: 23.7  . Smokeless tobacco: Never Used  Vaping Use  . Vaping Use: Never used  Substance Use Topics  . Alcohol use: No  . Drug use: No    Family History Family History  Problem Relation Age of Onset  . Diabetes Sister   No family history of bleeding/clotting disorders, porphyria or autoimmune disease   Allergies  Allergen Reactions  . Penicillins Rash and Other (See Comments)    Rash in and around the mouth Has patient had a PCN reaction causing immediate rash, facial/tongue/throat swelling, SOB or lightheadedness with hypotension: Yes Has patient had a PCN reaction causing severe rash involving mucus membranes or skin necrosis: Yes Has patient had a PCN reaction that required hospitalization: No Has patient had a PCN reaction occurring within the last 10 years: Yes If all of the above answers are "NO", then may proceed with Cephalosporin use.   . Bactrim [Sulfamethoxazole-Trimethoprim] Other (See Comments)    Mouth sores, Sores develop in mouth     REVIEW OF SYSTEMS (Negative unless checked)  Constitutional: [] Weight loss  [] Fever  [] Chills Cardiac: [] Chest pain   [] Chest pressure   [] Palpitations   [] Shortness of breath when laying flat   [] Shortness of breath at rest   [x] Shortness of breath with exertion. Vascular:  [x] Pain in legs with walking   [x] Pain in legs at rest   [] Pain in legs when laying flat   [] Claudication   [] Pain in feet when walking  [] Pain in feet at rest  [] Pain in feet when laying flat    [] History of DVT   [] Phlebitis   [x] Swelling in legs   [] Varicose veins   [] Non-healing ulcers Pulmonary:   [] Uses home oxygen   [] Productive cough   [] Hemoptysis   [] Wheeze  [] COPD   [] Asthma Neurologic:  [] Dizziness  [] Blackouts   [] Seizures   [] History of stroke   [] History of TIA  [] Aphasia   [] Temporary blindness   [] Dysphagia   [] Weakness or numbness in arms   [] Weakness or numbness in legs Musculoskeletal:  [] Arthritis   [] Joint swelling   [x] Joint pain   [] Low back pain Hematologic:  [] Easy bruising  [] Easy bleeding   [] Hypercoagulable state   [] Anemic  [] Hepatitis Gastrointestinal:  [] Blood in stool   [] Vomiting blood  [] Gastroesophageal  reflux/heartburn   [] Difficulty swallowing. Genitourinary:  [] Chronic kidney disease   [] Difficult urination  [] Frequent urination  [] Burning with urination   [] Blood in urine Skin:  [] Rashes   [] Ulcers   [] Wounds Psychological:  [] History of anxiety   []  History of major depression.    Physical Examination  Vitals:   11/11/20 1733 11/11/20 1734 11/11/20 1830 11/11/20 1900  BP:   139/87 (!) 133/114  Pulse: 95 96 97 94  Resp: 17 16 19  (!) 22  Temp:      TempSrc:      SpO2: 100% 100% 100% (!) 80%  Weight:      Height:       Body mass index is 25.94 kg/m.  Head: [place]/AT, No temporalis wasting. Prominent temp pulse not noted. Ear/Nose/Throat: Nares w/o erythema or drainage, oropharynx w/o obsrtuction.  Eyes: PER, Sclera nonicteric.  Neck: Supple, no nuchal rigidity.  No JVD.  Pulmonary:  Breath sounds equal bilaterally, no use of accessory muscles.  Cardiac: RRR, normal S1, S2. Vascular: The right dorsalis pedis is 2+.  The right posterior tibial is nonpalpable.  On the left the dorsalis pedis and posterior tibial are nonpalpable.  The left popliteal pulses nonpalpable.  On the right there is severe venous stasis dermatitis with mild edema.  On the left there are changes consistent with cellulitis from the ankle to the knee there is 4+ pitting  edema with orange peel changes to the skin.  There is an ulcer underneath the first metatarsal head on the left Gastrointestinal: soft, non-tender, non-distended.  Musculoskeletal: Moves all extremities.  No deformity or atrophy. No edema. Neurologic: CN 2-12 intact. Symmetrical.  Speech is fluent.  Psychiatric: Judgment intact, Mood & affect appropriate for pt's clinical situation. Dermatologic: Severe venous rashes on the right with cellulitic changes on the left + ulcer noted on the left.  + cellulitis with open wounds.      CBC Lab Results  Component Value Date   WBC 8.2 11/11/2020   HGB 10.4 (L) 11/11/2020   HCT 31.6 (L) 11/11/2020   MCV 87.3 11/11/2020   PLT 191 11/11/2020    BMET    Component Value Date/Time   NA 130 (L) 11/11/2020 1513   NA 136 04/17/2014 1026   K 4.5 11/11/2020 1513   K 5.1 04/17/2014 1026   CL 94 (L) 11/11/2020 1513   CL 103 04/17/2014 1026   CO2 27 11/11/2020 1513   CO2 28 04/17/2014 1026   GLUCOSE 308 (H) 11/11/2020 1513   GLUCOSE 124 (H) 04/17/2014 1026   BUN 20 11/11/2020 1513   BUN 28 (H) 04/17/2014 1026   CREATININE 1.05 11/11/2020 1513   CREATININE 1.30 04/17/2014 1026   CALCIUM 8.2 (L) 11/11/2020 1513   CALCIUM 8.6 04/17/2014 1026   GFRNONAA >60 11/11/2020 1513   GFRNONAA 59 (L) 04/17/2014 1026   GFRAA >60 06/20/2020 1001   GFRAA >60 04/17/2014 1026   Estimated Creatinine Clearance: 70.5 mL/min (by C-G formula based on SCr of 1.05 mg/dL).  COAG Lab Results  Component Value Date   INR 1.0 12/01/2018   INR 0.97 05/05/2017    Assessment/Plan 1.  Atherosclerotic occlusive disease bilateral lower extremities with ulceration and cellulitis of the left lower extremity:  Recommend:  The patient has evidence of severe atherosclerotic changes of both lower extremities associated with cellulitis / ulceration ulceration and tissue loss of the left foot.  This represents a left limb threatening ischemia and places the patient at the risk  for  left limb loss.  Patient should undergo angiography of the left lower extremity with the hope for intervention for limb salvage once his infection is under control.  The risks and benefits as well as the alternative therapies was discussed in detail with the patient.  All questions were answered.  Patient agrees to proceed with left leg angiography.  The patient will follow up with me in the office after the procedure.  2.  Cellulitis with diabetic foot abscess left: Podiatry is evaluating and is planning to perform incision and drainage.  Antibiotics have been started.  Once his infection has been controlled we can move forward with revascularization.  3.  Diabetes mellitus: Continue hypoglycemic medications as already ordered, these medications have been reviewed and there are no changes at this time.  Hgb A1C to be monitored as already arranged by primary service  4.  Hypertension: Continue antihypertensive medications as already ordered, these medications have been reviewed and there are no changes at this time.     Hortencia Pilar, MD  11/11/2020 7:36 PM

## 2020-11-11 NOTE — ED Triage Notes (Signed)
Pt comes pov with sob for about 3 weeks and left leg swelling. Sent by PCP who was concerned for clots. Pt hypotensive and sounds tight when breathing. Pt has stents in both legs and is on plavix.

## 2020-11-11 NOTE — Consult Note (Signed)
CODE SEPSIS - PHARMACY COMMUNICATION  **Broad Spectrum Antibiotics should be administered within 1 hour of Sepsis diagnosis**  Time Code Sepsis Called/Page Received: 1550  Antibiotics Ordered: Cefepime/Vancomycin  Time of 1st antibiotic administration: 1701  Additional action taken by pharmacy: called ED and spoke with nurse - she was hanging  If necessary, Name of Provider/Nurse Contacted: Arvid Right ,PharmD Clinical Pharmacist  11/11/2020  3:50 PM

## 2020-11-11 NOTE — Sepsis Progress Note (Signed)
Notified provider of need to order repeat lactic acid. Lactic acid increased from 1.1 to 2.1. Fluid bolus, and 3 antibiotics have been given. Order for follow up Lactic acid was placed

## 2020-11-11 NOTE — ED Notes (Signed)
Patients food tray placed in room by RN

## 2020-11-12 ENCOUNTER — Encounter: Payer: Self-pay | Admitting: Internal Medicine

## 2020-11-12 ENCOUNTER — Inpatient Hospital Stay
Admit: 2020-11-12 | Discharge: 2020-11-12 | Disposition: A | Discharge status: Home / Self Care | Payer: Medicare HMO | Attending: Internal Medicine | Admitting: Internal Medicine

## 2020-11-12 ENCOUNTER — Inpatient Hospital Stay: Payer: Medicare HMO

## 2020-11-12 DIAGNOSIS — B952 Enterococcus as the cause of diseases classified elsewhere: Secondary | ICD-10-CM | POA: Diagnosis not present

## 2020-11-12 DIAGNOSIS — R7881 Bacteremia: Secondary | ICD-10-CM

## 2020-11-12 DIAGNOSIS — D649 Anemia, unspecified: Secondary | ICD-10-CM

## 2020-11-12 DIAGNOSIS — I428 Other cardiomyopathies: Secondary | ICD-10-CM | POA: Diagnosis not present

## 2020-11-12 DIAGNOSIS — J9601 Acute respiratory failure with hypoxia: Secondary | ICD-10-CM

## 2020-11-12 DIAGNOSIS — I739 Peripheral vascular disease, unspecified: Secondary | ICD-10-CM | POA: Diagnosis not present

## 2020-11-12 DIAGNOSIS — L089 Local infection of the skin and subcutaneous tissue, unspecified: Secondary | ICD-10-CM

## 2020-11-12 DIAGNOSIS — E1165 Type 2 diabetes mellitus with hyperglycemia: Secondary | ICD-10-CM | POA: Diagnosis not present

## 2020-11-12 DIAGNOSIS — E11621 Type 2 diabetes mellitus with foot ulcer: Secondary | ICD-10-CM | POA: Diagnosis not present

## 2020-11-12 DIAGNOSIS — A419 Sepsis, unspecified organism: Secondary | ICD-10-CM | POA: Diagnosis not present

## 2020-11-12 DIAGNOSIS — K219 Gastro-esophageal reflux disease without esophagitis: Secondary | ICD-10-CM

## 2020-11-12 DIAGNOSIS — E119 Type 2 diabetes mellitus without complications: Secondary | ICD-10-CM

## 2020-11-12 LAB — CBC
HCT: 30.7 % — ABNORMAL LOW (ref 39.0–52.0)
Hemoglobin: 10.2 g/dL — ABNORMAL LOW (ref 13.0–17.0)
MCH: 28.9 pg (ref 26.0–34.0)
MCHC: 33.2 g/dL (ref 30.0–36.0)
MCV: 87 fL (ref 80.0–100.0)
Platelets: 165 10*3/uL (ref 150–400)
RBC: 3.53 MIL/uL — ABNORMAL LOW (ref 4.22–5.81)
RDW: 13.2 % (ref 11.5–15.5)
WBC: 5.9 10*3/uL (ref 4.0–10.5)
nRBC: 0 % (ref 0.0–0.2)

## 2020-11-12 LAB — BLOOD CULTURE ID PANEL (REFLEXED) - BCID2

## 2020-11-12 LAB — ECHOCARDIOGRAM COMPLETE
AR max vel: 2.14 cm2
AV Area VTI: 1.92 cm2
AV Area mean vel: 1.75 cm2
AV Mean grad: 2 mmHg
AV Peak grad: 3.9 mmHg
Ao pk vel: 0.99 m/s
Area-P 1/2: 5.23 cm2
Height: 70 in
MV VTI: 1.56 cm2
S' Lateral: 3.3 cm
Weight: 2892.44 oz

## 2020-11-12 LAB — BASIC METABOLIC PANEL
Anion gap: 7 (ref 5–15)
BUN: 15 mg/dL (ref 8–23)
CO2: 27 mmol/L (ref 22–32)
Calcium: 8 mg/dL — ABNORMAL LOW (ref 8.9–10.3)
Chloride: 100 mmol/L (ref 98–111)
Creatinine, Ser: 0.91 mg/dL (ref 0.61–1.24)
GFR, Estimated: >60 mL/min (ref >60)
Glucose, Bld: 111 mg/dL — ABNORMAL HIGH (ref 70–99)
Potassium: 4.4 mmol/L (ref 3.5–5.1)
Sodium: 134 mmol/L — ABNORMAL LOW (ref 135–145)

## 2020-11-12 LAB — GLUCOSE, CAPILLARY
Glucose-Capillary: 87 mg/dL (ref 70–99)
Glucose-Capillary: 84 mg/dL (ref 70–99)
Glucose-Capillary: 75 mg/dL (ref 70–99)
Glucose-Capillary: 62 mg/dL — ABNORMAL LOW (ref 70–99)
Glucose-Capillary: 116 mg/dL — ABNORMAL HIGH (ref 70–99)

## 2020-11-12 LAB — SEDIMENTATION RATE: Sed Rate: 47 mm/hr — ABNORMAL HIGH (ref 0–20)

## 2020-11-12 LAB — PROTIME-INR
INR: 1.2 (ref 0.8–1.2)
Prothrombin Time: 15.2 seconds (ref 11.4–15.2)

## 2020-11-12 LAB — BRAIN NATRIURETIC PEPTIDE: B Natriuretic Peptide: 1133.5 pg/mL — ABNORMAL HIGH (ref 0.0–100.0)

## 2020-11-12 LAB — C-REACTIVE PROTEIN: CRP: 11.4 mg/dL — ABNORMAL HIGH (ref <1.0)

## 2020-11-12 LAB — TSH: TSH: 3.259 u[IU]/mL (ref 0.350–4.500)

## 2020-11-12 IMAGING — MR MR FOOT*L* W/O CM
5 series · 40 of 40 positions shown · non-contrast
Comparison: Radiographs [DATE]

CLINICAL DATA: Pain and swelling. Diabetic with history of prior
amputations.

EXAM:
MRI OF THE LEFT FOOT WITHOUT CONTRAST
TECHNIQUE: Multiplanar, multisequence MR imaging of the left foot was
performed. No intravenous contrast was administered.

[Series 2: STIR · sagittal · left · 3.0mm · 0.70mm/px · 5 of 29 slices shown]
[im 1/29]
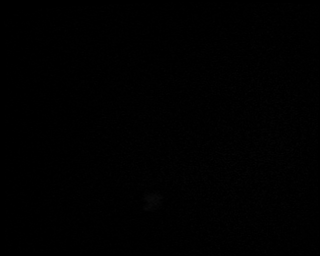
[im 8/29]
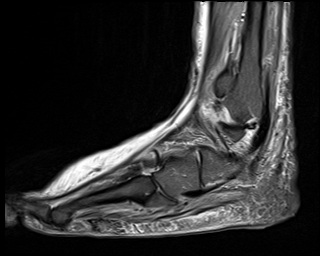
[im 15/29]
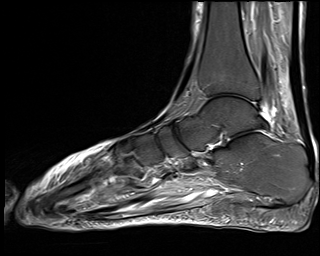
[im 22/29]
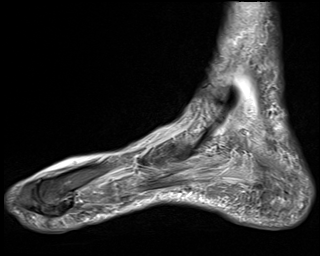
[im 29/29]
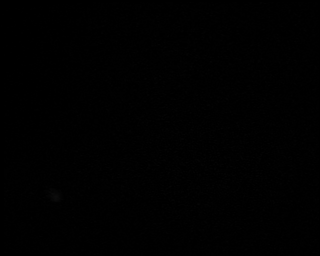

[Series 3: T1 · coronal · left · 3.5mm · 0.38mm/px · 12 of 59 slices shown (1 of 2)]
[im 1/59]
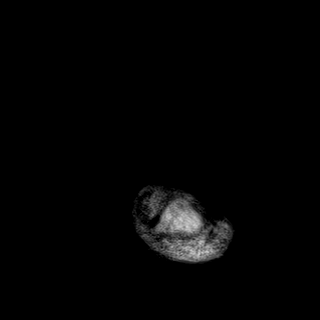
[im 6/59]
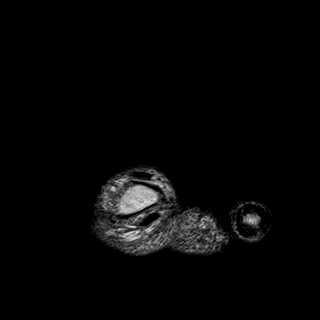
[im 11/59]
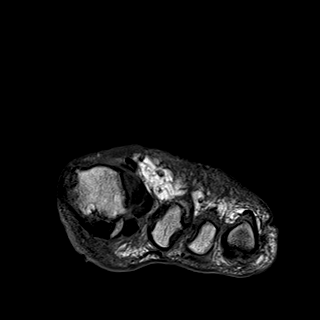
[im 16/59]
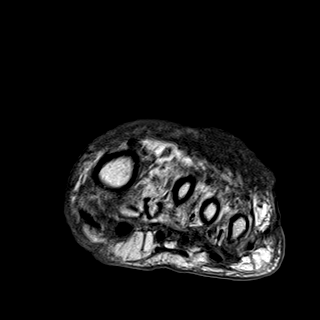
[im 22/59]
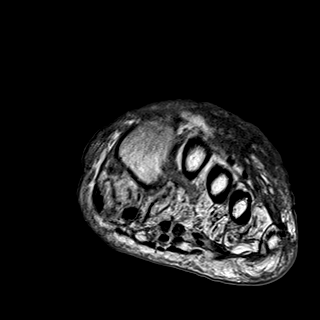
[im 27/59]
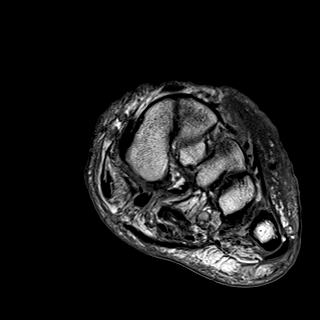
[im 32/59]
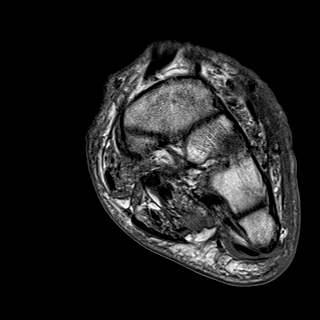
[im 37/59]
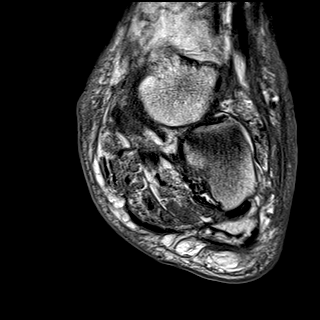
[im 43/59]
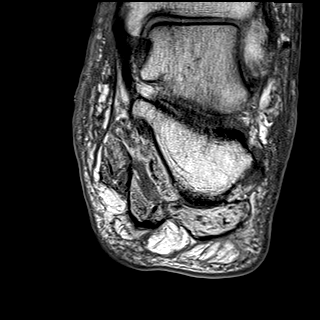
[im 48/59]
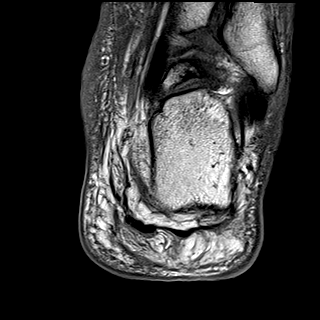
[im 53/59]
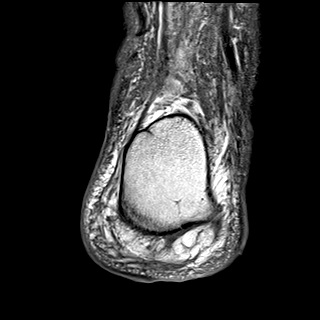
[im 59/59]
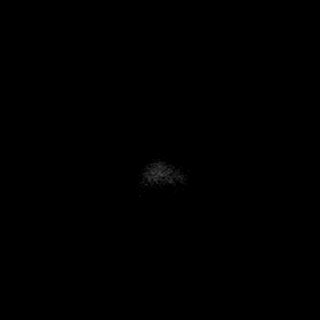

[Series 4: T1 · axial · left · 3.0mm · 0.94mm/px · z∈[-65,+47]mm · 6 of 29 slices shown (2 of 2)]
[im 1/29]
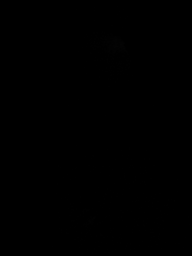
[im 6/29]
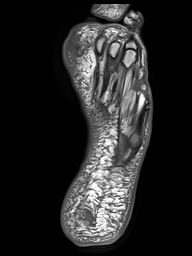
[im 12/29]
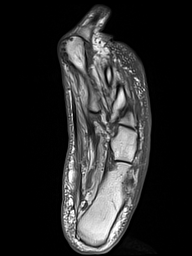
[im 17/29]
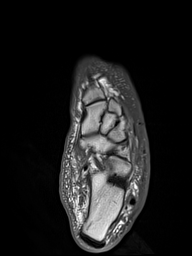
[im 23/29]
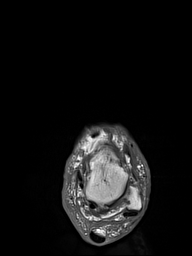
[im 29/29]
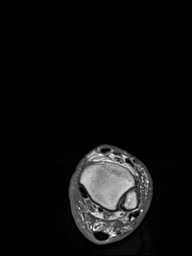

[Series 8: T2 · axial · left · 3.0mm · 0.94mm/px · z∈[-61,+47]mm · 5 of 28 slices shown (1 of 2)]
[im 1/28]
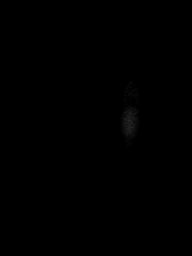
[im 7/28]
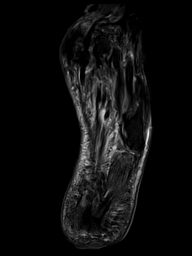
[im 14/28]
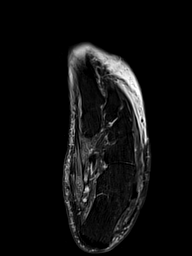
[im 21/28]
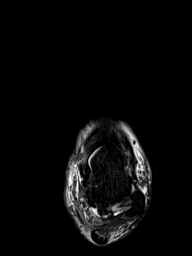
[im 28/28]
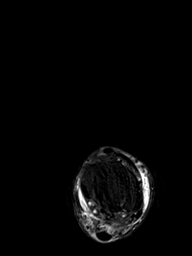

[Series 9: T2 · coronal · left · 3.5mm · 0.54mm/px · 12 of 59 slices shown (2 of 2)]
[im 1/59]
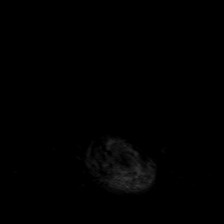
[im 6/59]
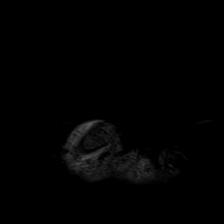
[im 11/59]
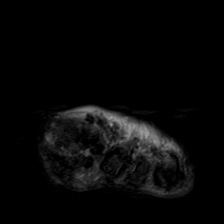
[im 16/59]
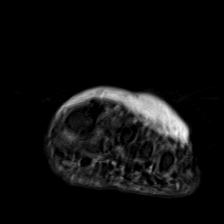
[im 22/59]
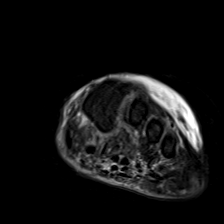
[im 27/59]
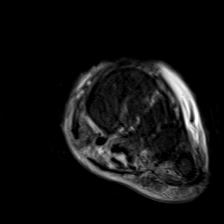
[im 32/59]
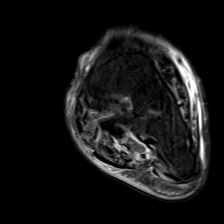
[im 37/59]
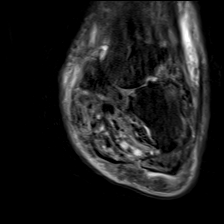
[im 43/59]
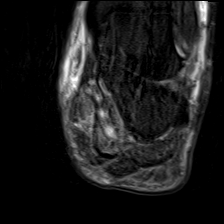
[im 48/59]
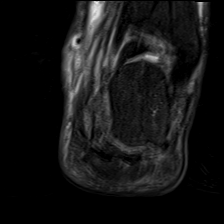
[im 53/59]
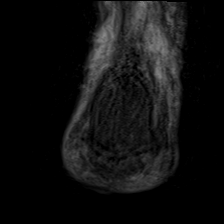
[im 59/59]
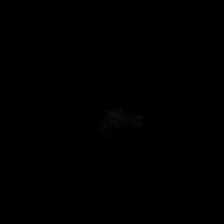

[40 of 40 positions shown; findings below may reference images not displayed]

FINDINGS: Prior amputations of the second and third toes are noted along with
the fifth toe and down to the mid fifth metatarsal.

Significant degenerative changes at the first MTP joint with marked
hallux valgus deformity. Mild midfoot and hindfoot degenerative
changes.

Diffuse subcutaneous soft tissue swelling/edema/fluid suggesting
cellulitis. This is most pronounced along the dorsum of the
forefoot. I do not see a discrete open wound. No focal fluid
collection to suggest a drainable soft tissue abscess. There is also
mild diffuse myositis without definite findings for pyomyositis.

No MR findings suspicious for septic arthritis or osteomyelitis.
IMPRESSION: 1. Diffuse subcutaneous soft tissue swelling/edema/fluid suggesting
cellulitis. No focal fluid collection to suggest a drainable soft
tissue abscess.
2. Mild diffuse myositis without definite findings for pyomyositis.
3. No MR findings suspicious for septic arthritis or osteomyelitis.
4. Prior amputations.

## 2020-11-12 MED ORDER — OXYCODONE HCL 5 MG PO TABS
5.0000 mg | ORAL_TABLET | ORAL | Status: DC | PRN
  Administered 2020-11-13 – 2020-11-24 (×5): 5 mg via ORAL
  Filled 2020-11-12 (×5): qty 1

## 2020-11-12 MED ORDER — IPRATROPIUM-ALBUTEROL 0.5-2.5 (3) MG/3ML IN SOLN
3.0000 mL | RESPIRATORY_TRACT | Status: DC | PRN
  Administered 2020-11-15: 15:00:00 3 mL via RESPIRATORY_TRACT
  Filled 2020-11-12 (×2): qty 3

## 2020-11-12 MED ORDER — VANCOMYCIN HCL 1000 MG/200ML IV SOLN
1000.0000 mg | Freq: Two times a day (BID) | INTRAVENOUS | Status: DC
  Administered 2020-11-12 – 2020-11-13 (×3): 1000 mg via INTRAVENOUS
  Filled 2020-11-12 (×6): qty 200

## 2020-11-12 MED ORDER — COLLAGENASE 250 UNIT/GM EX OINT
TOPICAL_OINTMENT | Freq: Every day | CUTANEOUS | Status: DC
  Administered 2020-11-12: 1 via TOPICAL
  Filled 2020-11-12: qty 30

## 2020-11-12 MED ORDER — ONDANSETRON HCL 4 MG PO TABS: 4.0000 mg | ORAL_TABLET | ORAL | Status: DC | PRN

## 2020-11-12 MED ORDER — SODIUM CHLORIDE 0.9 % IV SOLN
INTRAVENOUS | Status: DC | PRN
  Administered 2020-11-12 – 2020-11-17 (×3): 1000 mL via INTRAVENOUS
  Administered 2020-11-20 – 2020-11-23 (×3): 250 mL via INTRAVENOUS

## 2020-11-12 MED ORDER — METOPROLOL TARTRATE 5 MG/5ML IV SOLN: 5.0000 mg | INTRAVENOUS | Status: DC | PRN

## 2020-11-12 MED ORDER — ONDANSETRON HCL 4 MG/2ML IJ SOLN
4.0000 mg | INTRAMUSCULAR | Status: DC | PRN
  Administered 2020-11-12 – 2020-11-13 (×2): 4 mg via INTRAVENOUS
  Filled 2020-11-12 (×2): qty 2

## 2020-11-12 MED ORDER — DM-GUAIFENESIN ER 30-600 MG PO TB12: 1.0000 | ORAL_TABLET | Freq: Two times a day (BID) | ORAL | Status: DC | PRN

## 2020-11-12 MED ORDER — ACETAMINOPHEN 325 MG PO TABS
650.0000 mg | ORAL_TABLET | Freq: Four times a day (QID) | ORAL | Status: DC | PRN
  Administered 2020-11-13: 650 mg via ORAL
  Filled 2020-11-12: qty 2

## 2020-11-12 MED ORDER — HYDRALAZINE HCL 20 MG/ML IJ SOLN: 10.0000 mg | INTRAMUSCULAR | Status: DC | PRN

## 2020-11-12 MED ORDER — VANCOMYCIN HCL 1750 MG/350ML IV SOLN
1750.0000 mg | INTRAVENOUS | Status: DC
  Filled 2020-11-12: qty 350

## 2020-11-12 MED ORDER — SENNOSIDES-DOCUSATE SODIUM 8.6-50 MG PO TABS
1.0000 | ORAL_TABLET | Freq: Every evening | ORAL | Status: DC | PRN
  Administered 2020-11-14 – 2020-11-15 (×2): 1 via ORAL
  Filled 2020-11-12 (×2): qty 1

## 2020-11-12 MED ORDER — IPRATROPIUM-ALBUTEROL 0.5-2.5 (3) MG/3ML IN SOLN
3.0000 mL | Freq: Four times a day (QID) | RESPIRATORY_TRACT | Status: DC
  Administered 2020-11-12 – 2020-11-14 (×7): 3 mL via RESPIRATORY_TRACT
  Filled 2020-11-12 (×5): qty 3

## 2020-11-12 MED ORDER — DIPHENHYDRAMINE HCL 25 MG PO CAPS
25.0000 mg | ORAL_CAPSULE | ORAL | Status: DC | PRN
  Administered 2020-11-12 – 2020-11-13 (×3): 25 mg via ORAL
  Filled 2020-11-12 (×3): qty 1

## 2020-11-12 NOTE — Consult Note (Addendum)
[name]  DOB: 02-Sep-1951  MRN: [id number]  Date/Time: 11/12/2020 2:40 PM  REQUESTING PROVIDER: Dr. Reesa Chew Subjective:  REASON FOR CONSULT: Enterococcus bacteremia ? [name] is a 70 y.o. 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 He is followed by podiatrist.  Last saw them on 10/22/2020 when the left first metatarsal superficial ulcer was debrided. Pt says he has had some shortnes sof breath with minimal exertion for the past 2 weeks and also fatigue and poor appetite. He has been sleeping a lot. Ex smoker, quit many years ago, has a baseline cough. He has been having abdominal pain and diarrhea and saw his PCP on 11/03/20. Abdominal film done was normal. He was given zofran for nausea. He returned to the PCP with SOB on 11/11/20 and as his Pulse ox was 81 % ( improved to 95% with 2 litres of oxygen he was sent to ED.Vitals in the ED were 84/65, temp 98.7, HR 52, Pulse ox 91%,  Labs showed na 130, cr 1.05, alb 2.7, BNP 1423, troponin 62, wbc 8.2, HB 10.4, PLT 191 Blood culture sent    Past Medical History:  Diagnosis Date  . Cholecystitis, chronic   . Cholelithiasis   . COPD (chronic obstructive pulmonary disease) ([place])   . Diabetes mellitus without complication ([place])   . Diabetic retinopathy ([place])   . Dyspnea   . Fatty liver 2008  . Fracture of clavicle 1992   left   . Fracture of ribs, multiple 1992   3 ribs  . GERD (gastroesophageal reflux disease)   . Hyperlipidemia   . Hypertension   . Paroxysmal SVT (supraventricular tachycardia) ([place])   . Pelvis fracture ([place]) 1992  . Peripheral vascular disease ([place])   . Pleurisy   . Pleurisy   . Seasonal allergies     Past Surgical History:  Procedure Laterality Date  . AMPUTATION TOE Left 02/25/2017   Procedure: AMPUTATION TOE-LEFT 2ND MPJ;  Surgeon:  Samara Deist, DPM;  Location: [facility] ORS;  Service: Podiatry;  Laterality: Left;  . AMPUTATION TOE Left 10/20/2018   Procedure: TOE MPJ T2 LEFT;  Surgeon: Samara Deist, DPM;  Location: [facility] ORS;  Service: Podiatry;  Laterality: Left;  . AMPUTATION TOE Left 01/12/2019   Procedure: AMPUTATION LEFT 5TH TOE AND JOINT;  Surgeon: Samara Deist, DPM;  Location: [facility] ORS;  Service: Podiatry;  Laterality: Left;  . BACK SURGERY  2008  . CHOLECYSTECTOMY    . COLONOSCOPY WITH PROPOFOL N/A 08/15/2017   Procedure: COLONOSCOPY WITH PROPOFOL;  Surgeon: Lollie Sails, MD;  [facility];  Service: Endoscopy;  Laterality: N/A;  . ENDARTERECTOMY Right 05/12/2017   Procedure: ENDARTERECTOMY CAROTID;  Surgeon: [name], MD;  Location: [facility] ORS;  Service: Vascular;  Laterality: Right;  . ENDARTERECTOMY FEMORAL Left 12/01/2018   Procedure: ENDARTERECTOMY FEMORAL;  Surgeon: [name], MD;  Location: [facility] ORS;  Service: Vascular;  Laterality: Left;  . ERCP    . FOOT SURGERY Right    x 3  . LOWER EXTREMITY ANGIOGRAM Left 12/01/2018   Procedure: LOWER EXTREMITY ANGIOGRAM;  Surgeon: [name], MD;  Location: [facility] ORS;  Service: Vascular;  Laterality: Left;  . LOWER EXTREMITY ANGIOGRAPHY Left 10/30/2018   Procedure: LOWER EXTREMITY ANGIOGRAPHY;  Surgeon: [name], MD;  Location: [facility];  Service: Cardiovascular;  Laterality:  Left;  . LOWER EXTREMITY ANGIOGRAPHY Left 11/30/2018   Procedure: LOWER EXTREMITY ANGIOGRAPHY;  Surgeon: [name], MD;  Location: [facility];  Service: Cardiovascular;  Laterality: Left;    Social History   Socioeconomic History  . Marital status: Single    Spouse name: Not on file  . Number of children: Not on file  . Years of education: Not on file  . Highest education level: Not on file  Occupational History  . Not on file  Tobacco Use  . Smoking status: Former Smoker    Quit date: 02/24/1997    Years since quitting: 23.7  . Smokeless  tobacco: Never Used  Vaping Use  . Vaping Use: Never used  Substance and Sexual Activity  . Alcohol use: No  . Drug use: No  . Sexual activity: Not on file  Other Topics Concern  . Not on file  Social History Narrative  . Not on file   Social Determinants of Health   Financial Resource Strain: Not on file  Food Insecurity: Not on file  Transportation Needs: Not on file  Physical Activity: Not on file  Stress: Not on file  Social Connections: Not on file  Intimate Partner Violence: Not on file    Family History  Problem Relation Age of Onset  . Diabetes Sister    Allergies  Allergen Reactions  . Penicillins Rash and Other (See Comments)    Rash in and around the mouth Has patient had a PCN reaction causing immediate rash, facial/tongue/throat swelling, SOB or lightheadedness with hypotension: Yes Has patient had a PCN reaction causing severe rash involving mucus membranes or skin necrosis: Yes Has patient had a PCN reaction that required hospitalization: No Has patient had a PCN reaction occurring within the last 10 years: Yes If all of the above answers are "NO", then may proceed with Cephalosporin use.   . Bactrim [Sulfamethoxazole-Trimethoprim] Other (See Comments)    Mouth sores, Sores develop in mouth    ? Current Facility-Administered Medications  Medication Dose Route Frequency Provider Last Rate Last Admin  . 0.9 %  sodium chloride infusion   Intravenous PRN Damita Lack, MD   Stopped at 11/12/20 [phone]  . acetaminophen (TYLENOL) tablet 325 mg  325 mg Oral Q6H PRN Cox, Amy N, DO       Or  . acetaminophen (TYLENOL) suppository 325 mg  325 mg Rectal Q6H PRN Cox, Amy N, DO      . acetaminophen (TYLENOL) tablet 650 mg  650 mg Oral Q6H PRN Amin, Ankit Chirag, MD      . aspirin EC tablet 81 mg  81 mg Oral Daily Cox, Amy N, DO   81 mg at 11/12/20 0849  . clopidogrel (PLAVIX) tablet 75 mg  75 mg Oral Daily Cox, Amy N, DO   75 mg at 11/12/20 0849  . collagenase  (SANTYL) ointment   Topical Daily Caroline More, DPM      . dextromethorphan-guaiFENesin (MUCINEX DM) 30-600 MG per 12 hr tablet 1 tablet  1 tablet Oral BID PRN Amin, Ankit Chirag, MD      . enoxaparin (LOVENOX) injection 40 mg  40 mg Subcutaneous Q24H Cox, Amy N, DO   40 mg at 11/11/20 2238  . fluticasone (FLONASE) 50 MCG/ACT nasal spray 2 spray  2 spray Each Nare Daily Cox, Amy N, DO   2 spray at 11/12/20 0851  . gabapentin (NEURONTIN) capsule 300 mg  300 mg Oral TID Cox, Amy N,  DO   300 mg at 11/12/20 0849  . guaiFENesin (MUCINEX) 12 hr tablet 600 mg  600 mg Oral BID PRN Cox, Amy N, DO   600 mg at 11/11/20 1859  . hydrALAZINE (APRESOLINE) injection 10 mg  10 mg Intravenous Q4H PRN Amin, Ankit Chirag, MD      . insulin aspart (novoLOG) injection 0-15 Units  0-15 Units Subcutaneous TID WC Cox, Amy N, DO      . insulin aspart (novoLOG) injection 0-5 Units  0-5 Units Subcutaneous QHS Cox, Amy N, DO      . ipratropium-albuterol (DUONEB) 0.5-2.5 (3) MG/3ML nebulizer solution 3 mL  3 mL Nebulization Q4H PRN Amin, Ankit Chirag, MD      . ipratropium-albuterol (DUONEB) 0.5-2.5 (3) MG/3ML nebulizer solution 3 mL  3 mL Nebulization Q6H Amin, Ankit Chirag, MD   3 mL at 11/12/20 1417  . metoprolol tartrate (LOPRESSOR) injection 5 mg  5 mg Intravenous Q4H PRN Amin, Ankit Chirag, MD      . mometasone-formoterol (DULERA) 100-5 MCG/ACT inhaler 2 puff  2 puff Inhalation BID Cox, Amy N, DO   2 puff at 11/12/20 0850  . morphine 2 MG/ML injection 2 mg  2 mg Intravenous Q3H PRN Cox, Amy N, DO      . ondansetron (ZOFRAN) tablet 4 mg  4 mg Oral Q4H PRN Amin, Ankit Chirag, MD       Or  . ondansetron (ZOFRAN) injection 4 mg  4 mg Intravenous Q4H PRN Amin, Ankit Chirag, MD      . oxybutynin (DITROPAN) tablet 5 mg  5 mg Oral BID Cox, Amy N, DO   5 mg at 11/12/20 0850  . oxyCODONE (Oxy IR/ROXICODONE) immediate release tablet 5 mg  5 mg Oral Q4H PRN Amin, Ankit Chirag, MD      . pravastatin (PRAVACHOL) tablet 10 mg  10 mg  Oral q1800 Cox, Amy N, DO      . senna-docusate (Senokot-S) tablet 1 tablet  1 tablet Oral QHS PRN Amin, Ankit Chirag, MD      . traMADol (ULTRAM) tablet 50 mg  50 mg Oral Q12H PRN Cox, Amy N, DO   50 mg at 11/12/20 0849  . vancomycin (VANCOREADY) IVPB 1000 mg/200 mL  1,000 mg Intravenous Q12H Oswald Hillock, [facility]   Stopped at 11/12/20 7124     Abtx:  Anti-infectives (From admission, onward)   Start     Dose/Rate Route Frequency Ordered Stop   11/12/20 1700  vancomycin (VANCOREADY) IVPB 1750 mg/350 mL  Status:  Discontinued        1,750 mg 175 mL/hr over 120 Minutes Intravenous Every 24 hours 11/11/20 1848 11/12/20 0500   11/12/20 1700  vancomycin (VANCOREADY) IVPB 1750 mg/350 mL  Status:  Discontinued        1,750 mg 175 mL/hr over 120 Minutes Intravenous Every 24 hours 11/12/20 0503 11/12/20 0758   11/12/20 1000  vancomycin (VANCOREADY) IVPB 1000 mg/200 mL        1,000 mg 200 mL/hr over 60 Minutes Intravenous Every 12 hours 11/12/20 0758     11/11/20 2200  ceFEPIme (MAXIPIME) 2 g in sodium chloride 0.9 % 100 mL IVPB  Status:  Discontinued        2 g 200 mL/hr over 30 Minutes Intravenous Every 8 hours 11/11/20 1848 11/12/20 0500   11/11/20 1700  vancomycin (VANCOREADY) IVPB 750 mg/150 mL        750 mg 150 mL/hr over 60 Minutes Intravenous  Once  11/11/20 1551 11/11/20 1928   11/11/20 1600  ceFEPIme (MAXIPIME) 2 g in sodium chloride 0.9 % 100 mL IVPB        2 g 200 mL/hr over 30 Minutes Intravenous  Once 11/11/20 1548 11/11/20 1738   11/11/20 1600  metroNIDAZOLE (FLAGYL) IVPB 500 mg        500 mg 100 mL/hr over 60 Minutes Intravenous  Once 11/11/20 1548 11/11/20 1912   11/11/20 1600  vancomycin (VANCOCIN) IVPB 1000 mg/200 mL premix        1,000 mg 200 mL/hr over 60 Minutes Intravenous  Once 11/11/20 1548 11/11/20 1810      REVIEW OF SYSTEMS:  Const: negative fever, negative chills, negative weight loss Eyes: negative diplopia or visual changes, negative eye pain ENT: negative  coryza, negative sore throat Resp: negative cough, hemoptysis, dyspnea Cards: negative for chest pain, palpitations, lower extremity edema GU: negative for frequency, dysuria and hematuria GI: Negative for abdominal pain, diarrhea, bleeding, constipation Skin: negative for rash and pruritus Heme: negative for easy bruising and gum/nose bleeding MS: negative for myalgias, arthralgias, back pain and muscle weakness Neurolo:negative for headaches, dizziness, vertigo, memory problems  Psych: negative for feelings of anxiety, depression  Endocrine: negative for thyroid, diabetes Allergy/Immunology- negative for any medication or food allergies ? Pertinent Positives include : Objective:  VITALS:  BP (!) 94/56 (BP Location: Right Arm)   Pulse 91   Temp 98.8 F (37.1 C) (Oral)   Resp 16   Ht 5\' 10"  (1.778 m)   Wt 82 kg   SpO2 94%   BMI 25.94 kg/m  PHYSICAL EXAM:  General: Alert, cooperative, no distress,  pale Head: Normocephalic, without obvious abnormality, atraumatic. Eyes: Conjunctivae clear, anicteric sclerae. Pupils are equal ENT Nares normal. No drainage or sinus tenderness. Lips, mucosa, and tongue normal. No Thrush edentulous Neck: Supple, symmetrical, no adenopathy, thyroid: non tender no carotid bruit and no JVD. Back: No CVA tenderness. Lungs: Clear to auscultation bilaterally. No Wheezing or Rhonchi. No rales. Heart: Regular rate and rhythm, no murmur, rub or gallop. Abdomen:distended, umbilicus flushed Extremities:     Skin: No rashes or lesions. Or bruising Lymph: Cervical, supraclavicular normal. Neurologic: Grossly non-focal Pertinent Labs Lab Results CBC    Component Value Date/Time   WBC 5.9 11/12/2020 0615   RBC 3.53 (L) 11/12/2020 0615   HGB 10.2 (L) 11/12/2020 0615   HGB 15.2 04/17/2014 1026   HCT 30.7 (L) 11/12/2020 0615   HCT 44.6 04/17/2014 1026   PLT 165 11/12/2020 0615   PLT 127 (L) 04/17/2014 1026   MCV 87.0 11/12/2020 0615   MCV 90  04/17/2014 1026   MCH 28.9 11/12/2020 0615   MCHC 33.2 11/12/2020 0615   RDW 13.2 11/12/2020 0615   RDW 13.3 04/17/2014 1026   LYMPHSABS 1.3 10/18/2018 0959   LYMPHSABS 1.3 04/17/2014 1026   MONOABS 0.6 10/18/2018 0959   MONOABS 0.4 04/17/2014 1026   EOSABS 0.2 10/18/2018 0959   EOSABS 0.2 04/17/2014 1026   BASOSABS 0.1 10/18/2018 0959   BASOSABS 0.1 04/17/2014 1026    CMP Latest Ref Rng & Units 11/12/2020 11/11/2020 06/20/2020  Glucose 70 - 99 mg/dL 111(H) 308(H) 149(H)  BUN 8 - 23 mg/dL 15 20 21   Creatinine 0.61 - 1.24 mg/dL 0.91 1.05 1.07  Sodium 135 - 145 mmol/L 134(L) 130(L) 136  Potassium 3.5 - 5.1 mmol/L 4.4 4.5 4.8  Chloride 98 - 111 mmol/L 100 94(L) 96(L)  CO2 22 - 32 mmol/L 27 27 32  Calcium 8.9 - 10.3 mg/dL 8.0(L) 8.2(L) 9.5  Total Protein 6.5 - 8.1 g/dL - 6.0(L) 7.8  Total Bilirubin 0.3 - 1.2 mg/dL - 1.2 2.5(H)  Alkaline Phos 38 - 126 U/L - 56 46  AST 15 - 41 U/L - 23 21  ALT 0 - 44 U/L - 22 26      Microbiology: Recent Results (from the past 240 hour(s))  Blood culture (routine x 2)     Status: None (Preliminary result)   Collection Time: 11/11/20  3:13 PM   Specimen: BLOOD  Result Value Ref Range Status   Specimen Description BLOOD RIGHT ANTECUBITAL  Final   Special Requests   Final    BOTTLES DRAWN AEROBIC AND ANAEROBIC Blood Culture adequate volume   Culture  Setup Time   Final    GRAM POSITIVE COCCI IN BOTH AEROBIC AND ANAEROBIC BOTTLES Organism ID to follow CRITICAL RESULT CALLED TO, READ BACK BY AND VERIFIED WITHVioleta Gelinas PHARMD [id number] 10/28/20 HNM Performed at [facility], [address] [place], [place] [address]    Culture GRAM POSITIVE COCCI  Final   Report Status PENDING  Incomplete  Blood Culture ID Panel (Reflexed)     Status: Abnormal   Collection Time: 11/11/20  3:13 PM  Result Value Ref Range Status   Enterococcus faecalis DETECTED (A) NOT DETECTED Final    Comment: CRITICAL RESULT CALLED TO, READ BACK BY AND VERIFIED  WITH: Violeta Gelinas PHAMRD [id number] 11/12/20 HNM    Enterococcus Faecium NOT DETECTED NOT DETECTED Final   Listeria monocytogenes NOT DETECTED NOT DETECTED Final   Staphylococcus species NOT DETECTED NOT DETECTED Final   Staphylococcus aureus (BCID) NOT DETECTED NOT DETECTED Final   Staphylococcus epidermidis NOT DETECTED NOT DETECTED Final   Staphylococcus lugdunensis NOT DETECTED NOT DETECTED Final   Streptococcus species NOT DETECTED NOT DETECTED Final   Streptococcus agalactiae NOT DETECTED NOT DETECTED Final   Streptococcus pneumoniae NOT DETECTED NOT DETECTED Final   Streptococcus pyogenes NOT DETECTED NOT DETECTED Final   A.calcoaceticus-baumannii NOT DETECTED NOT DETECTED Final   Bacteroides fragilis NOT DETECTED NOT DETECTED Final   Enterobacterales NOT DETECTED NOT DETECTED Final   Enterobacter cloacae complex NOT DETECTED NOT DETECTED Final   Escherichia coli NOT DETECTED NOT DETECTED Final   Klebsiella aerogenes NOT DETECTED NOT DETECTED Final   Klebsiella oxytoca NOT DETECTED NOT DETECTED Final   Klebsiella pneumoniae NOT DETECTED NOT DETECTED Final   Proteus species NOT DETECTED NOT DETECTED Final   Salmonella species NOT DETECTED NOT DETECTED Final   Serratia marcescens NOT DETECTED NOT DETECTED Final   Haemophilus influenzae NOT DETECTED NOT DETECTED Final   Neisseria meningitidis NOT DETECTED NOT DETECTED Final   Pseudomonas aeruginosa NOT DETECTED NOT DETECTED Final   Stenotrophomonas maltophilia NOT DETECTED NOT DETECTED Final   Candida albicans NOT DETECTED NOT DETECTED Final   Candida auris NOT DETECTED NOT DETECTED Final   Candida glabrata NOT DETECTED NOT DETECTED Final   Candida krusei NOT DETECTED NOT DETECTED Final   Candida parapsilosis NOT DETECTED NOT DETECTED Final   Candida tropicalis NOT DETECTED NOT DETECTED Final   Cryptococcus neoformans/gattii NOT DETECTED NOT DETECTED Final   Vancomycin resistance NOT DETECTED NOT DETECTED Final    Comment:  Performed at [facility], [address] [place], [place] [address]  Blood culture (routine x 2)     Status: None (Preliminary result)   Collection Time: 11/11/20  4:38 PM   Specimen: BLOOD  Result Value Ref Range  Status   Specimen Description BLOOD LEFT ANTECUBITAL  Final   Special Requests   Final    BOTTLES DRAWN AEROBIC AND ANAEROBIC Blood Culture results may not be optimal due to an inadequate volume of blood received in culture bottles   Culture  Setup Time   Final    GRAM POSITIVE COCCI IN BOTH AEROBIC AND ANAEROBIC BOTTLES CRITICAL VALUE NOTED.  VALUE IS CONSISTENT WITH PREVIOUSLY REPORTED AND CALLED VALUE. Performed at [facility], [address] [place], [place] [address]    Culture [facility] POSITIVE COCCI  Final   Report Status PENDING  Incomplete  Resp Panel by RT-PCR (Flu A&B, Covid) Nasopharyngeal Swab     Status: None   Collection Time: 11/11/20  6:45 PM   Specimen: Nasopharyngeal Swab; Nasopharyngeal(NP) swabs in vial transport medium  Result Value Ref Range Status   SARS Coronavirus 2 by RT PCR NEGATIVE NEGATIVE Final    Comment: (NOTE) SARS-CoV-2 target nucleic acids are NOT DETECTED.  The SARS-CoV-2 RNA is generally detectable in upper respiratory specimens during the acute phase of infection. The lowest concentration of SARS-CoV-2 viral copies this assay can detect is 138 copies/mL. A negative result does not preclude SARS-Cov-2 infection and should not be used as the sole basis for treatment or other patient management decisions. A negative result may occur with  improper specimen collection/handling, submission of specimen other than nasopharyngeal swab, presence of viral mutation(s) within the areas targeted by this assay, and inadequate number of viral copies(<138 copies/mL). A negative result must be combined with clinical observations, patient history, and epidemiological information. The expected result is Negative.  Fact Sheet for  Patients:  EntrepreneurPulse.com.au  Fact Sheet for Healthcare Providers:  IncredibleEmployment.be  This test is no t yet approved or cleared by the [place] FDA and  has been authorized for detection and/or diagnosis of SARS-CoV-2 by FDA under an Emergency Use Authorization (EUA). This EUA will remain  in effect (meaning this test can be used) for the duration of the COVID-19 declaration under Section 564(b)(1) of the Act, 21 U.S.C.section 360bbb-3(b)(1), unless the authorization is terminated  or revoked sooner.       Influenza A by PCR NEGATIVE NEGATIVE Final   Influenza B by PCR NEGATIVE NEGATIVE Final    Comment: (NOTE) The Xpert Xpress SARS-CoV-2/FLU/RSV plus assay is intended as an aid in the diagnosis of influenza from Nasopharyngeal swab specimens and should not be used as a sole basis for treatment. Nasal washings and aspirates are unacceptable for Xpert Xpress SARS-CoV-2/FLU/RSV testing.  Fact Sheet for Patients: EntrepreneurPulse.com.au  Fact Sheet for Healthcare Providers: IncredibleEmployment.be  This test is not yet approved or cleared by the [place] FDA and has been authorized for detection and/or diagnosis of SARS-CoV-2 by FDA under an Emergency Use Authorization (EUA). This EUA will remain in effect (meaning this test can be used) for the duration of the COVID-19 declaration under Section 564(b)(1) of the Act, 21 U.S.C. section 360bbb-3(b)(1), unless the authorization is terminated or revoked.  Performed at [facility], [address] [place], [place] [address]     IMAGING RESULTS:  I have personally reviewed the films ?no Congestion  Impression/Recommendation ?Enterococcus bacteremia- high bioburden with all 4 bottles positive -  Pt has severe mouth ulcers to penicillin and also to ampicillin . So he is currently on IV vancomycin The source of the  bacteremia could be the left foot wound Pt will TEE to r/o endocarditis as 2 d echo was non  revealing  Left foot wounds with infection- combination of PAD, DM, neuropathy culture sent by podiatrist- MRI done  Peripheral vascular disease DM- management as per primary team Peri  Acute hypoxic resp failure, increased BNP suggestive of CHF but CXR and CT chest does not show pulmonary congestion With bacteremia need to r/o endocarditis No PE  Anemia ( was 15.4 in sept 2021). Now 10.4. r/o GI loss , hemolysis  Abdominal distension with flushed umbilicus- ? Ascites- check [place] ? ? ___________________________________________________ Discussed with patient in detail Note:  This document was prepared using Dragon voice recognition software and may include unintentional dictation errors.

## 2020-11-12 NOTE — Progress Notes (Signed)
Incision and debridement at bedside. Sample collected at bedside. Waiting for order to send down collected sample.  Patient had complaints of Nausea. MD Amid changed Zofran order to q4h.

## 2020-11-12 NOTE — Progress Notes (Signed)
Pharmacy Antibiotic Note  [name] is a 70 y.o. male admitted on 11/11/2020. Pharmacy has been consulted for vancomycin dosing.  Pt is currently afeb, with procal 0.34, WBC 5, sed rate 47, hx of growing PSA and enterococcus faecalis in tissue cx in 12/2018.  - f/u with podiatry consult - planning for I&D - plan for revascularization 2/18 once infection under control.  - 4 out of 4 bcx growing GPC BCID + for Enterococcus faecalis    Plan: Pt received a total of 1750 mg of vancomycin loading dose followed by 1750 mg q24H maintenance dose. Will adjust vancomycin dose to 1000 mg q12H due to improvement in Scr and new AUC calculations. Predicted AUC 538. Goal AUC 400-550. Scr used 0.91. Plan for vancomycin levels in 3-4 days if vancomycin continued.   Cefepime discontinued.   F/u with bcx sensitivities.    Height: 5\' 10"  (177.8 cm) Weight: 82 kg (180 lb 12.4 oz) IBW/kg (Calculated) : 73  Temp (24hrs), Avg:98.2 F (36.8 C), Min:97.3 F (36.3 C), Max:98.7 F (37.1 C)  Recent Labs  Lab 11/11/20 1513 11/11/20 1638 11/11/20 1845 11/12/20 0615  WBC 8.2  --   --  5.9  CREATININE 1.05  --   --  0.91  LATICACIDVEN 1.1 2.1* 1.2  --     Estimated Creatinine Clearance: 81.3 mL/min (by C-G formula based on SCr of 0.91 mg/dL).    Allergies  Allergen Reactions  . Penicillins Rash and Other (See Comments)    Rash in and around the mouth Has patient had a PCN reaction causing immediate rash, facial/tongue/throat swelling, SOB or lightheadedness with hypotension: Yes Has patient had a PCN reaction causing severe rash involving mucus membranes or skin necrosis: Yes Has patient had a PCN reaction that required hospitalization: No Has patient had a PCN reaction occurring within the last 10 years: Yes If all of the above answers are "NO", then may proceed with Cephalosporin use.   . Bactrim [Sulfamethoxazole-Trimethoprim] Other (See Comments)    Mouth sores, Sores develop in mouth     Antimicrobials this admission: Vancomycin 2/15 >> Cefepime 2/15 >> 2/16 Metronidazole 2/15 x 1  Microbiology results: 2/15 BCx: GPC. BCID + enterococcus.    Thank you for allowing pharmacy to be a part of this patient's care.  Oswald Hillock, PharmD, BCPS 11/12/2020 7:59 AM

## 2020-11-12 NOTE — Progress Notes (Signed)
*  PRELIMINARY RESULTS* Echocardiogram 2D Echocardiogram has been performed.  [name] 11/12/2020, 10:50 AM

## 2020-11-12 NOTE — Consult Note (Signed)
PODIATRY / FOOT AND ANKLE SURGERY CONSULTATION NOTE  Requesting Physician: Rupert Stacks, DO   Reason for consult:  L foot swelling and wounds  Chief Complaint: Left foot wounds   HPI: [name] is a 70 y.o. male who who presented to [facility] due to shortness of breath and weakness as well as nausea and vomiting.  Patient also noted days had diarrhea as well.  Patient notes that this is been ongoing issue for him for the past 2 weeks.  He went to his PCP a few days ago and was noted to have increased swelling to the left leg as well as new diabetic foot ulcer to his left heel.  Patient was sent to the emergency room and admitted due to potential CHF exacerbation as well as left foot wound with concern for infection.  Patient still notes that he has some nausea and vomiting at bedside 1 presents today.  Patient has had a history of left foot ulcers requiring amputations of multiple selective digits and metatarsals.  It appears as though patient is a regular patient of Dr. Alvera Singh.  Patient presents resting in bed comfortably today but does not have any dressings on his left foot at this time.  PMHx:  Past Medical History:  Diagnosis Date  . Cholecystitis, chronic   . Cholelithiasis   . COPD (chronic obstructive pulmonary disease) ([place])   . Diabetes mellitus without complication ([place])   . Diabetic retinopathy ([place])   . Dyspnea   . Fatty liver 2008  . Fracture of clavicle 1992   left   . Fracture of ribs, multiple 1992   3 ribs  . GERD (gastroesophageal reflux disease)   . Hyperlipidemia   . Hypertension   . Paroxysmal SVT (supraventricular tachycardia) ([place])   . Pelvis fracture ([place]) 1992  . Peripheral vascular disease ([place])   . Pleurisy   . Pleurisy   . Seasonal allergies     Surgical Hx:  Past Surgical History:  Procedure Laterality Date  . AMPUTATION TOE Left 02/25/2017   Procedure: AMPUTATION TOE-LEFT 2ND MPJ;  Surgeon: Samara Deist, DPM;  Location:  [facility] ORS;  Service: Podiatry;  Laterality: Left;  . AMPUTATION TOE Left 10/20/2018   Procedure: TOE MPJ T2 LEFT;  Surgeon: Samara Deist, DPM;  Location: [facility] ORS;  Service: Podiatry;  Laterality: Left;  . AMPUTATION TOE Left 01/12/2019   Procedure: AMPUTATION LEFT 5TH TOE AND JOINT;  Surgeon: Samara Deist, DPM;  Location: [facility] ORS;  Service: Podiatry;  Laterality: Left;  . BACK SURGERY  2008  . CHOLECYSTECTOMY    . COLONOSCOPY WITH PROPOFOL N/A 08/15/2017   Procedure: COLONOSCOPY WITH PROPOFOL;  Surgeon: Lollie Sails, MD;  Location: [facility] ENDOSCOPY;  Service: Endoscopy;  Laterality: N/A;  . ENDARTERECTOMY Right 05/12/2017   Procedure: ENDARTERECTOMY CAROTID;  Surgeon: [name], MD;  Location: [facility] ORS;  Service: Vascular;  Laterality: Right;  . ENDARTERECTOMY FEMORAL Left 12/01/2018   Procedure: ENDARTERECTOMY FEMORAL;  Surgeon: [name], MD;  Location: [facility] ORS;  Service: Vascular;  Laterality: Left;  . ERCP    . FOOT SURGERY Right    x 3  . LOWER EXTREMITY ANGIOGRAM Left 12/01/2018   Procedure: LOWER EXTREMITY ANGIOGRAM;  Surgeon: [name], MD;  Location: [facility] ORS;  Service: Vascular;  Laterality: Left;  . LOWER EXTREMITY ANGIOGRAPHY Left 10/30/2018   Procedure: LOWER EXTREMITY ANGIOGRAPHY;  Surgeon: [name], MD;  Location: [place] CV LAB;  Service: Cardiovascular;  Laterality:  Left;  . LOWER EXTREMITY ANGIOGRAPHY Left 11/30/2018   Procedure: LOWER EXTREMITY ANGIOGRAPHY;  Surgeon: [name], MD;  Location: [place] [facility];  Service: Cardiovascular;  Laterality: Left;    FHx:  Family History  Problem Relation Age of Onset  . Diabetes Sister     Social History:  reports that he quit smoking about 23 years ago. He has never used smokeless tobacco. He reports that he does not drink alcohol and does not use drugs.  Allergies:  Allergies  Allergen Reactions  . Penicillins Rash and Other (See Comments)    Rash in and around the mouth Has patient had a PCN  reaction causing immediate rash, facial/tongue/throat swelling, SOB or lightheadedness with hypotension: Yes Has patient had a PCN reaction causing severe rash involving mucus membranes or skin necrosis: Yes Has patient had a PCN reaction that required hospitalization: No Has patient had a PCN reaction occurring within the last 10 years: Yes If all of the above answers are "NO", then may proceed with Cephalosporin use.   . Bactrim [Sulfamethoxazole-Trimethoprim] Other (See Comments)    Mouth sores, Sores develop in mouth    Medications Prior to Admission  Medication Sig Dispense Refill  . aspirin EC 81 MG tablet Take 81 mg by mouth daily.    . Calcium Carbonate-Vitamin D (CALCIUM PLUS VITAMIN D PO) Take 1 tablet by mouth daily.    . clopidogrel (PLAVIX) 75 MG tablet TAKE 1 TABLET BY MOUTH ONCE DAILY 90 tablet 3  . gabapentin (NEURONTIN) 300 MG capsule Take 1 capsule (300 mg total) by mouth 3 (three) times daily. 90 capsule 1  . glyBURIDE (DIABETA) 5 MG tablet Take 1 tablet (5 mg total) by mouth 2 (two) times daily with a meal. 60 tablet 3  . lovastatin (MEVACOR) 10 MG tablet Take 1 tab  Po QHS 90 tablet 1  . meloxicam (MOBIC) 15 MG tablet Take by mouth.    . mometasone-formoterol (DULERA) 100-5 MCG/ACT AERO Inhale 2 puffs into the lungs 2 (two) times daily as needed for wheezing.    . ondansetron (ZOFRAN-ODT) 4 MG disintegrating tablet Take 1 tablet (4 mg total) by mouth every 8 (eight) hours as needed for nausea or vomiting. 20 tablet 0  . oxybutynin (DITROPAN) 5 MG tablet Take 1 tablet (5 mg total) by mouth 2 (two) times daily. 180 tablet 0  . traMADol (ULTRAM) 50 MG tablet TAKE 1 TABLET BY MOUTH EVERY 12 HOURS ASNEEDED FOR PAIN 45 tablet 2  . glucose blood (ACCU-CHEK GUIDE) test strip Use as instructed 100 each 12  . Lancets (ACCU-CHEK SOFT TOUCH) lancets Use as instructed 100 each 12  . pneumococcal 23 valent vaccine (PNEUMOVAX 23) 25 MCG/0.5ML injection Inject 0.16ml IM once 0.5 mL 0     Physical Exam: General: Alert and oriented.  No apparent distress.  Vascular: DP/PT pulses nonpalpable bilateral, capillary fill time is intact to digits bilateral, no hair growth noted to digits bilateral.  Mild nonpitting bilateral lower extremity edema present left slightly worse than right.  Minimal to no erythema noted to bilateral lower extremities.  Feet appear to be warm to touch.  Neuro: Light touch sensation reduced to bilateral lower extremities.  Derm: Preulcerative lesion or callus present to the left first metatarsal phalangeal joint, once removed did not reveal any ulceration present to this area.  Large pressure ulceration present to the plantar aspect left heel that appears to have a stable eschar type With maceration around the borders as well  as blister type formation.  After this area was debrided the wound measured approximately 2 cm x 2.7 cm with unknown depth.  No erythema present to this area, mild serous drainage, no odor.  Skin appears to be thin and atrophic to both feet.  MSK: Previous selective digital amputations left foot.  Results for orders placed or performed during the hospital encounter of 11/11/20 (from the past 48 hour(s))  CBC     Status: Abnormal   Collection Time: 11/11/20  3:13 PM  Result Value Ref Range   WBC 8.2 4.0 - 10.5 K/uL   RBC 3.62 (L) 4.22 - 5.81 MIL/uL   Hemoglobin 10.4 (L) 13.0 - 17.0 g/dL   HCT 31.6 (L) 39.0 - 52.0 %   MCV 87.3 80.0 - 100.0 fL   MCH 28.7 26.0 - 34.0 pg   MCHC 32.9 30.0 - 36.0 g/dL   RDW 13.2 11.5 - 15.5 %   Platelets 191 150 - 400 K/uL   nRBC 0.0 0.0 - 0.2 %    Comment: Performed at [facility], [address] [place], [place] [address]  Comprehensive metabolic panel     Status: Abnormal   Collection Time: 11/11/20  3:13 PM  Result Value Ref Range   Sodium 130 (L) 135 - 145 mmol/L   Potassium 4.5 3.5 - 5.1 mmol/L   Chloride 94 (L) 98 - 111 mmol/L   CO2 27 22 - 32 mmol/L   Glucose, Bld 308  (H) 70 - 99 mg/dL    Comment: Glucose reference range applies only to samples taken after fasting for at least 8 hours.   BUN 20 8 - 23 mg/dL   Creatinine, Ser 1.05 0.61 - 1.24 mg/dL   Calcium 8.2 (L) 8.9 - 10.3 mg/dL   Total Protein 6.0 (L) 6.5 - 8.1 g/dL   Albumin 2.7 (L) 3.5 - 5.0 g/dL   AST 23 15 - 41 U/L   ALT 22 0 - 44 U/L   Alkaline Phosphatase 56 38 - 126 U/L   Total Bilirubin 1.2 0.3 - 1.2 mg/dL   GFR, Estimated >60 >60 mL/min    Comment: (NOTE) Calculated using the CKD-EPI Creatinine Equation (2021)    Anion gap 9 5 - 15    Comment: Performed at [facility], [address] [place], [place] [address]  Brain natriuretic peptide     Status: Abnormal   Collection Time: 11/11/20  3:13 PM  Result Value Ref Range   B Natriuretic Peptide 1,423.5 (H) 0.0 - 100.0 pg/mL    Comment: Performed at [facility], [address] [name], [place] [address]  Blood culture (routine x 2)     Status: None (Preliminary result)   Collection Time: 11/11/20  3:13 PM   Specimen: BLOOD  Result Value Ref Range   Specimen Description BLOOD RIGHT ANTECUBITAL    Special Requests      BOTTLES DRAWN AEROBIC AND ANAEROBIC Blood Culture adequate volume   Culture  Setup Time      GRAM POSITIVE COCCI IN BOTH AEROBIC AND ANAEROBIC BOTTLES Organism ID to follow CRITICAL RESULT CALLED TO, READ BACK BY AND VERIFIED WITHVioleta Gelinas [facility] [id number] 10/28/20 HNM Performed at [facility], [address] [place], [place] [address]    Culture GRAM POSITIVE COCCI    Report Status PENDING   Lactic acid, plasma     Status: None   Collection Time: 11/11/20  3:13 PM  Result Value Ref Range   Lactic  Acid, Venous 1.1 0.5 - 1.9 mmol/L    Comment: Performed at [facility], [place], [place] [address]  Troponin I (High Sensitivity)     Status: Abnormal   Collection Time: 11/11/20  3:13 PM  Result Value Ref Range   Troponin I (High Sensitivity) 62 (H) <18 ng/L     Comment: (NOTE) Elevated high sensitivity troponin I (hsTnI) values and significant  changes across serial measurements may suggest ACS but many other  chronic and acute conditions are known to elevate hsTnI results.  Refer to the "Links" section for chest pain algorithms and additional  guidance. Performed at [facility], [address] [name], [place] [address]   Blood Culture ID Panel (Reflexed)     Status: Abnormal   Collection Time: 11/11/20  3:13 PM  Result Value Ref Range   Enterococcus faecalis DETECTED (A) NOT DETECTED    Comment: CRITICAL RESULT CALLED TO, READ BACK BY AND VERIFIED WITH: Violeta Gelinas [facility] [id number] 11/12/20 HNM    Enterococcus Faecium NOT DETECTED NOT DETECTED   Listeria monocytogenes NOT DETECTED NOT DETECTED   Staphylococcus species NOT DETECTED NOT DETECTED   Staphylococcus aureus (BCID) NOT DETECTED NOT DETECTED   Staphylococcus epidermidis NOT DETECTED NOT DETECTED   Staphylococcus lugdunensis NOT DETECTED NOT DETECTED   Streptococcus species NOT DETECTED NOT DETECTED   Streptococcus agalactiae NOT DETECTED NOT DETECTED   Streptococcus pneumoniae NOT DETECTED NOT DETECTED   Streptococcus pyogenes NOT DETECTED NOT DETECTED   A.calcoaceticus-baumannii NOT DETECTED NOT DETECTED   Bacteroides fragilis NOT DETECTED NOT DETECTED   Enterobacterales NOT DETECTED NOT DETECTED   Enterobacter cloacae complex NOT DETECTED NOT DETECTED   Escherichia coli NOT DETECTED NOT DETECTED   Klebsiella aerogenes NOT DETECTED NOT DETECTED   Klebsiella oxytoca NOT DETECTED NOT DETECTED   Klebsiella pneumoniae NOT DETECTED NOT DETECTED   Proteus species NOT DETECTED NOT DETECTED   Salmonella species NOT DETECTED NOT DETECTED   Serratia marcescens NOT DETECTED NOT DETECTED   Haemophilus influenzae NOT DETECTED NOT DETECTED   Neisseria meningitidis NOT DETECTED NOT DETECTED   Pseudomonas aeruginosa NOT DETECTED NOT DETECTED   Stenotrophomonas maltophilia NOT  DETECTED NOT DETECTED   Candida albicans NOT DETECTED NOT DETECTED   Candida auris NOT DETECTED NOT DETECTED   Candida glabrata NOT DETECTED NOT DETECTED   Candida krusei NOT DETECTED NOT DETECTED   Candida parapsilosis NOT DETECTED NOT DETECTED   Candida tropicalis NOT DETECTED NOT DETECTED   Cryptococcus neoformans/gattii NOT DETECTED NOT DETECTED   Vancomycin resistance NOT DETECTED NOT DETECTED    Comment: Performed at [facility], [place], [place] [address]  Troponin I (High Sensitivity)     Status: Abnormal   Collection Time: 11/11/20  4:36 PM  Result Value Ref Range   Troponin I (High Sensitivity) 59 (H) <18 ng/L    Comment: (NOTE) Elevated high sensitivity troponin I (hsTnI) values and significant  changes across serial measurements may suggest ACS but many other  chronic and acute conditions are known to elevate hsTnI results.  Refer to the "Links" section for chest pain algorithms and additional  guidance. Performed at [facility], [address] [place], [place] [address]   Blood culture (routine x 2)     Status: None (Preliminary result)   Collection Time: 11/11/20  4:38 PM   Specimen: BLOOD  Result Value Ref Range   Specimen Description BLOOD LEFT ANTECUBITAL    Special Requests  BOTTLES DRAWN AEROBIC AND ANAEROBIC Blood Culture results may not be optimal due to an inadequate volume of blood received in culture bottles   Culture  Setup Time      GRAM POSITIVE COCCI IN BOTH AEROBIC AND ANAEROBIC BOTTLES CRITICAL VALUE NOTED.  VALUE IS CONSISTENT WITH PREVIOUSLY REPORTED AND CALLED VALUE. Performed at [facility], [address] [place], [place] [address]    Culture GRAM POSITIVE COCCI    Report Status PENDING   Lactic acid, plasma     Status: Abnormal   Collection Time: 11/11/20  4:38 PM  Result Value Ref Range   Lactic Acid, Venous 2.1 (HH) 0.5 - 1.9 mmol/L    Comment: CRITICAL RESULT CALLED TO, READ BACK BY  AND VERIFIED WITH LAURA HERNANDEZ @1758  11/11/20 MJU Performed at [facility], [address], [place], [place] [address]   Resp Panel by RT-PCR (Flu A&B, Covid) Nasopharyngeal Swab     Status: None   Collection Time: 11/11/20  6:45 PM   Specimen: Nasopharyngeal Swab; Nasopharyngeal(NP) swabs in vial transport medium  Result Value Ref Range   SARS Coronavirus 2 by RT PCR NEGATIVE NEGATIVE    Comment: (NOTE) SARS-CoV-2 target nucleic acids are NOT DETECTED.  The SARS-CoV-2 RNA is generally detectable in upper respiratory specimens during the acute phase of infection. The lowest concentration of SARS-CoV-2 viral copies this assay can detect is 138 copies/mL. A negative result does not preclude SARS-Cov-2 infection and should not be used as the sole basis for treatment or other patient management decisions. A negative result may occur with  improper specimen collection/handling, submission of specimen other than nasopharyngeal swab, presence of viral mutation(s) within the areas targeted by this assay, and inadequate number of viral copies(<138 copies/mL). A negative result must be combined with clinical observations, patient history, and epidemiological information. The expected result is Negative.  Fact Sheet for Patients:  EntrepreneurPulse.com.au  Fact Sheet for Healthcare Providers:  IncredibleEmployment.be  This test is no t yet approved or cleared by the [place] FDA and  has been authorized for detection and/or diagnosis of SARS-CoV-2 by FDA under an Emergency Use Authorization (EUA). This EUA will remain  in effect (meaning this test can be used) for the duration of the COVID-19 declaration under Section 564(b)(1) of the Act, 21 U.S.C.section 360bbb-3(b)(1), unless the authorization is terminated  or revoked sooner.       Influenza A by PCR NEGATIVE NEGATIVE   Influenza B by PCR NEGATIVE NEGATIVE    Comment:  (NOTE) The Xpert Xpress SARS-CoV-2/FLU/RSV plus assay is intended as an aid in the diagnosis of influenza from Nasopharyngeal swab specimens and should not be used as a sole basis for treatment. Nasal washings and aspirates are unacceptable for Xpert Xpress SARS-CoV-2/FLU/RSV testing.  Fact Sheet for Patients: EntrepreneurPulse.com.au  Fact Sheet for Healthcare Providers: IncredibleEmployment.be  This test is not yet approved or cleared by the [place] FDA and has been authorized for detection and/or diagnosis of SARS-CoV-2 by FDA under an Emergency Use Authorization (EUA). This EUA will remain in effect (meaning this test can be used) for the duration of the COVID-19 declaration under Section 564(b)(1) of the Act, 21 U.S.C. section 360bbb-3(b)(1), unless the authorization is terminated or revoked.  Performed at [facility], [address] [place], [place] [address]   HIV Antibody (routine testing w rflx)     Status: None   Collection Time: 11/11/20  6:45 PM  Result Value Ref Range   HIV Screen  4th Generation wRfx Non Reactive Non Reactive    Comment: Performed at [facility], [place] [address], [place], [place] [address]  Procalcitonin     Status: None   Collection Time: 11/11/20  6:45 PM  Result Value Ref Range   Procalcitonin 0.34 ng/mL    Comment:        Interpretation: PCT (Procalcitonin) <= 0.5 ng/mL: Systemic infection (sepsis) is not likely. Local bacterial infection is possible. (NOTE)       Sepsis PCT Algorithm           Lower Respiratory Tract                                      Infection PCT Algorithm    ----------------------------     ----------------------------         PCT < 0.25 ng/mL                PCT < 0.10 ng/mL          Strongly encourage             Strongly discourage   discontinuation of antibiotics    initiation of antibiotics    ----------------------------      -----------------------------       PCT 0.25 - 0.50 ng/mL            PCT 0.10 - 0.25 ng/mL               OR       >80% decrease in PCT            Discourage initiation of                                            antibiotics      Encourage discontinuation           of antibiotics    ----------------------------     -----------------------------         PCT >= 0.50 ng/mL              PCT 0.26 - 0.50 ng/mL               AND        <80% decrease in PCT             Encourage initiation of                                             antibiotics       Encourage continuation           of antibiotics    ----------------------------     -----------------------------        PCT >= 0.50 ng/mL                  PCT > 0.50 ng/mL               AND         increase in PCT                  Strongly encourage  initiation of antibiotics    Strongly encourage escalation           of antibiotics                                     -----------------------------                                           PCT <= 0.25 ng/mL                                                 OR                                        > 80% decrease in PCT                                      Discontinue / Do not initiate                                             antibiotics  Performed at [facility], [address] [place], [place] [address]   TSH     Status: None   Collection Time: 11/11/20  6:45 PM  Result Value Ref Range   TSH 1.838 0.350 - 4.500 uIU/mL    Comment: Performed by a 3rd Generation assay with a functional sensitivity of <=0.01 uIU/mL. Performed at [facility], [address] [place], [place] [address]   Lactic acid, plasma     Status: None   Collection Time: 11/11/20  6:45 PM  Result Value Ref Range   Lactic Acid, Venous 1.2 0.5 - 1.9 mmol/L    Comment: Performed at [facility], [address] [place], [place] [address]  Glucose,  capillary     Status: Abnormal   Collection Time: 11/11/20  9:57 PM  Result Value Ref Range   Glucose-Capillary 173 (H) 70 - 99 mg/dL    Comment: Glucose reference range applies only to samples taken after fasting for at least 8 hours.  Protime-INR     Status: None   Collection Time: 11/12/20  6:15 AM  Result Value Ref Range   Prothrombin Time 15.2 11.4 - 15.2 seconds   INR 1.2 0.8 - 1.2    Comment: (NOTE) INR goal varies based on device and disease states. Performed at [facility], [address] [place], [place] [address]   CBC     Status: Abnormal   Collection Time: 11/12/20  6:15 AM  Result Value Ref Range   WBC 5.9 4.0 - 10.5 K/uL   RBC 3.53 (L) 4.22 - 5.81 MIL/uL   Hemoglobin 10.2 (L) 13.0 - 17.0 g/dL   HCT 30.7 (L) 39.0 - 52.0 %   MCV 87.0 80.0 - 100.0 fL   MCH 28.9 26.0 - 34.0 pg   MCHC 33.2 30.0 - 36.0  g/dL   RDW 13.2 11.5 - 15.5 %   Platelets 165 150 - 400 K/uL   nRBC 0.0 0.0 - 0.2 %    Comment: Performed at [facility], [address] [place], [place] [address]  Basic metabolic panel     Status: Abnormal   Collection Time: 11/12/20  6:15 AM  Result Value Ref Range   Sodium 134 (L) 135 - 145 mmol/L   Potassium 4.4 3.5 - 5.1 mmol/L   Chloride 100 98 - 111 mmol/L   CO2 27 22 - 32 mmol/L   Glucose, Bld 111 (H) 70 - 99 mg/dL    Comment: Glucose reference range applies only to samples taken after fasting for at least 8 hours.   BUN 15 8 - 23 mg/dL   Creatinine, Ser 0.91 0.61 - 1.24 mg/dL   Calcium 8.0 (L) 8.9 - 10.3 mg/dL   GFR, Estimated >60 >60 mL/min    Comment: (NOTE) Calculated using the CKD-EPI Creatinine Equation (2021)    Anion gap 7 5 - 15    Comment: Performed at [facility], [address] [place], [place] [address]  Sedimentation rate     Status: Abnormal   Collection Time: 11/12/20  6:15 AM  Result Value Ref Range   Sed Rate 47 (H) 0 - 20 mm/hr    Comment: Performed at [facility], [address] [place], [place] [address]  C-reactive protein     Status: Abnormal   Collection Time: 11/12/20  6:15 AM  Result Value Ref Range   CRP 11.4 (H) <1.0 mg/dL    Comment: Performed at [place] [address], [place], [place] [address]  Glucose, capillary     Status: None   Collection Time: 11/12/20  8:35 AM  Result Value Ref Range   Glucose-Capillary 87 70 - 99 mg/dL    Comment: Glucose reference range applies only to samples taken after fasting for at least 8 hours.  TSH     Status: None   Collection Time: 11/12/20  9:00 AM  Result Value Ref Range   TSH 3.259 0.350 - 4.500 uIU/mL    Comment: Performed by a 3rd Generation assay with a functional sensitivity of <=0.01 uIU/mL. Performed at [facility], [address] [place], [place] [address]   Brain natriuretic peptide     Status: Abnormal   Collection Time: 11/12/20  9:00 AM  Result Value Ref Range   B Natriuretic Peptide 1,133.5 (H) 0.0 - 100.0 pg/mL    Comment: Performed at [facility], [address] [place],  [address]  Glucose, capillary     Status: None   Collection Time: 11/12/20 11:46 AM  Result Value Ref Range   Glucose-Capillary 84 70 - 99 mg/dL    Comment: Glucose reference range applies only to samples taken after fasting for at least 8 hours.   DG Chest 2 View  Result Date: 11/11/2020 CLINICAL DATA:  Short of breath for 3 weeks, left leg swelling, hypotensive EXAM: CHEST - 2 VIEW COMPARISON:  09/29/2018 FINDINGS: Frontal and lateral views of the chest demonstrate an unremarkable cardiac silhouette. Lungs are hyperinflated with background interstitial prominence consistent with emphysema. No acute airspace disease, effusion, or pneumothorax. No acute bony abnormalities. IMPRESSION: 1. Emphysema, no acute process. Electronically Signed   By: Randa Ngo M.D.   On: 11/11/2020 16:01   CT Angio Chest PE W/Cm &/Or Wo Cm  Result Date: 11/11/2020 CLINICAL DATA:  Shortness  of breath  EXAM: CT ANGIOGRAPHY CHEST WITH CONTRAST TECHNIQUE: Multidetector CT imaging of the chest was performed using the standard protocol during bolus administration of intravenous contrast. Multiplanar CT image reconstructions and MIPs were obtained to evaluate the vascular anatomy. CONTRAST:  175mL OMNIPAQUE IOHEXOL 350 MG/ML SOLN COMPARISON:  None. FINDINGS: Cardiovascular: No filling defects in the pulmonary arteries to suggest pulmonary emboli. Heart is normal size. Aorta is normal caliber. Aortic and coronary artery calcifications. Mediastinum/Nodes: Mildly prominent/enlarged mediastinal lymph nodes. Left AP window lymph node has a short axis diameter of 14 mm. Findings similar to prior study. No axillary or hilar adenopathy. Lungs/Pleura: Trace bilateral pleural effusions. Bibasilar atelectasis. Emphysema. Upper Abdomen: Imaging into the upper abdomen demonstrates no acute findings. Musculoskeletal: Chest wall soft tissues are unremarkable. No acute bony abnormality. Review of the MIP images confirms the above findings. IMPRESSION: No evidence of pulmonary embolus. Trace bilateral pleural effusions, bibasilar atelectasis. Coronary artery disease. Aortic Atherosclerosis (ICD10-I70.0) and Emphysema (ICD10-J43.9). Electronically Signed   By: Rolm Baptise M.D.   On: 11/11/2020 16:55   US Venous Img Lower Unilateral Left  Result Date: 11/11/2020 CLINICAL DATA:  Leg pain and swelling. EXAM: LEFT LOWER EXTREMITY VENOUS DOPPLER ULTRASOUND TECHNIQUE: Gray-scale sonography with compression, as well as color and duplex ultrasound, were performed to evaluate the deep venous system(s) from the level of the common femoral vein through the popliteal and proximal calf veins. COMPARISON:  None. FINDINGS: VENOUS Normal compressibility of the common femoral, superficial femoral, and popliteal veins, as well as the visualized calf veins. Visualized portions of the great saphenous vein unremarkable. No filling defects to suggest  DVT on grayscale or color Doppler imaging. Doppler waveforms show normal direction of venous flow, normal respiratory plasticity and response to augmentation. Profunda femoral vein not visualized. Peroneal veins not well visualized. Limited views of the contralateral common femoral vein are unremarkable. OTHER None. IMPRESSION: No DVT noted as discussed above. Electronically Signed   By: Freeman Spur   On: 11/11/2020 17:15   DG Foot 2 Views Left  Result Date: 11/11/2020 CLINICAL DATA:  Leg pain and swelling. EXAM: LEFT FOOT - 2 VIEW COMPARISON:  No recent. FINDINGS: Prominent diffuse soft tissue swelling. Soft tissue wound is noted adjacent to the left great toe and over the heel. No radiopaque foreign body. Peripheral vascular calcification. Prior amputations left second, third, and fifth digits. Prominent degenerative change first MTP joint. No acute bony abnormality identified. No evidence of fracture or dislocation. No bony erosions noted. If osteomyelitis is a clinical concern MRI can be obtained. IMPRESSION: 1. Prominent diffuse soft tissue swelling. Soft tissue wounds noted adjacent to the left great toe and over the heel. No radiopaque foreign body. Peripheral vascular disease. 2. Prior amputations left second, third, and fifth digits. Prominent degenerative change first MTP joint. No acute bony abnormality identified. No bony erosions noted. If osteomyelitis is suspected MRI can be obtained. Electronically Signed   By: Marcello Moores  Register   On: 11/11/2020 16:33    Blood pressure (!) 94/56, pulse 91, temperature 98.8 F (37.1 C), temperature source Oral, resp. rate 16, height 5\' 10"  (1.778 m), weight 82 kg, SpO2 93 %.   Assessment 1. Unstageable pressure ulceration plantar left heel 2. Preulcerative lesion left first metatarsal phalangeal joint 3. PVD 4. Diabetes type 2 polyneuropathy 5. CHF  Plan -Patient seen and examined. -X-ray imaging reviewed and discussed with patient in detail.   No evidence of osteomyelitis present. -Paring of callus performed to the left first metatarsal  phalangeal joint with a 15 blade without incident.  Patient tolerated procedure well.  No ulceration present under this area. -100% excisional wound debridement performed to the left plantar heel without incident to the depth of dermal tissue with a 15 blade removing fibrous tissue and biofilm as well as deroofing a small blister to the area.  Post debridement measurement measured approximately 2 cm x 2.7 cm by unstageable which was the same as preoperative measurement. -Overall no acute signs of infection are present to either ulcerative areas. -Applied Betadine wet-to-dry dressing.  Recommend daily dressing changes. -Surgical shoe ordered for the left foot. -Appreciate medicine recommendations for antibiotic therapy.  Culture was sent off today from left heel wound.  Likely can switch to oral antibiotics for 7 days at discharge. -Appreciate vascular recommendations.  No surgery is indicated at this time.  Podiatry team to sign off with discharge placed in patient's chart.  Caroline More, DPM 11/12/2020, 12:53 PM

## 2020-11-12 NOTE — Progress Notes (Signed)
PHARMACY - PHYSICIAN COMMUNICATION CRITICAL VALUE ALERT - BLOOD CULTURE IDENTIFICATION (BCID)  [name] is an 70 y.o. male who presented to [facility] on 11/11/2020 with a chief complaint of sepsis   Assessment:  E faecalis in 3 of 4 bottles, no resistance detected.  (include suspected source if known) Pt has PCN allergy  Name of physician (or Provider) Contacted: Sharion Settler, NP   Current antibiotics: Vanc 1750 mg IV Q24H and Cefepime 2 gm IV Q8H  Changes to prescribed antibiotics recommended:  Pt has PCN allergy so cannot use ampicillin.   Will d/c cefepime and continue with Vanc.   Results for orders placed or performed during the hospital encounter of 11/11/20  Blood Culture ID Panel (Reflexed) (Collected: 11/11/2020  3:13 PM)  Result Value Ref Range   Enterococcus faecalis DETECTED (A) NOT DETECTED   Enterococcus Faecium NOT DETECTED NOT DETECTED   Listeria monocytogenes NOT DETECTED NOT DETECTED   Staphylococcus species NOT DETECTED NOT DETECTED   Staphylococcus aureus (BCID) NOT DETECTED NOT DETECTED   Staphylococcus epidermidis NOT DETECTED NOT DETECTED   Staphylococcus lugdunensis NOT DETECTED NOT DETECTED   Streptococcus species NOT DETECTED NOT DETECTED   Streptococcus agalactiae NOT DETECTED NOT DETECTED   Streptococcus pneumoniae NOT DETECTED NOT DETECTED   Streptococcus pyogenes NOT DETECTED NOT DETECTED   A.calcoaceticus-baumannii NOT DETECTED NOT DETECTED   Bacteroides fragilis NOT DETECTED NOT DETECTED   Enterobacterales NOT DETECTED NOT DETECTED   Enterobacter cloacae complex NOT DETECTED NOT DETECTED   Escherichia coli NOT DETECTED NOT DETECTED   Klebsiella aerogenes NOT DETECTED NOT DETECTED   Klebsiella oxytoca NOT DETECTED NOT DETECTED   Klebsiella pneumoniae NOT DETECTED NOT DETECTED   Proteus species NOT DETECTED NOT DETECTED   Salmonella species NOT DETECTED NOT DETECTED   Serratia marcescens NOT DETECTED NOT DETECTED   Haemophilus influenzae  NOT DETECTED NOT DETECTED   Neisseria meningitidis NOT DETECTED NOT DETECTED   Pseudomonas aeruginosa NOT DETECTED NOT DETECTED   Stenotrophomonas maltophilia NOT DETECTED NOT DETECTED   Candida albicans NOT DETECTED NOT DETECTED   Candida auris NOT DETECTED NOT DETECTED   Candida glabrata NOT DETECTED NOT DETECTED   Candida krusei NOT DETECTED NOT DETECTED   Candida parapsilosis NOT DETECTED NOT DETECTED   Candida tropicalis NOT DETECTED NOT DETECTED   Cryptococcus neoformans/gattii NOT DETECTED NOT DETECTED   Vancomycin resistance NOT DETECTED NOT DETECTED    [name] 11/12/2020  5:04 AM

## 2020-11-12 NOTE — Progress Notes (Signed)
PROGRESS NOTE    [name]  SWH:[id number] DOB: 1951-08-28 DOA: 11/11/2020 PCP: [name], MD   Brief Narrative:  70 year old with history of essential hypertension, nonischemic cardiomyopathy, DM2, peripheral neuropathy, PAD, history of tobacco use quit 1998 presents with shortness of breath, poor oral intake nausea, diarrhea with left lower extremity swelling with worsening diabetic foot infection.  Patient has been fully vaccinated and boosted against COVID-19. Lower extremity Dopplers negative for DVT, CTA chest showed pleural effusion/atelectasis pleural effusion and atelectasis but no PE.  Assessment & Plan:   Principal Problem:   Sepsis ([place]) Active Problems:   PAD (peripheral artery disease) ([place])   Diabetes ([place])   Mixed hyperlipidemia   Essential hypertension   GERD (gastroesophageal reflux disease)   Uncontrolled type 2 diabetes mellitus with hyperglycemia ([facility])   Cardiomyopathy, nonischemic ([place])   Primary generalized (osteo)arthritis   Moderate asthma without complication   Diabetic foot ulcer ([place])  Sepsis secondary to diabetic foot infection of his left lower extremity with ulceration and cellulitis nonpurulent Gram-positive bacteremia Left lower extremity leg pain -Sepsis physiology appears to be improving. -Follow culture data. Will obtain Mri to rule out Osteo -Antibiotics-vancomycin.  Cefepime discontinued. -Podiatry-plans for I&D? -Vascular-plans for angiogram on 2/18 with possible interventions -Lower extremity Dopplers-negative for DVT -We will need surveillance cultures.  Already getting echocardiogram, will monitor for endocarditis  Peripheral arterial disease -Currently on dual antiplatelet.  Aspirin Plavix and statin -Seen by vascular surgery.  Plans for angiogram and possible revascularization.  Chronic pain   -Pain control  Shortness of breath with bilateral pleural effusion/atelectasis without hypoxia -Out of bed to chair.  Incentive  spirometer.  As needed bronchodilators liters -Holding off on diuretics.  Discontinue IV fluids.  -Echocardiogram ordered-pending  Diabetes mellitus type 2 -Peripheral neuropathy secondary to DM2 -Insulin sliding scale and Accu-Chek -Gabapentin 3 times daily -Hemoglobin A1c-  Hyperlipidemia Cont Statin   DVT prophylaxis: Lovenox Code Status: DNR Family Communication:  Spoke with [id number] [phone]  Status is: Inpatient  Remains inpatient appropriate because:Inpatient level of care appropriate due to severity of illness   Dispo: The patient is from: Home              Anticipated d/c is to: SNF              Anticipated d/c date is: > 3 days              Patient currently is not medically stable to d/c.   Difficult to place patient No    Body mass index is 25.94 kg/m.     Subjective: Pain in LLE extremity which is better today. No other complaints.   Review of Systems Otherwise negative except as per HPI, including: General: Denies fever, chills, night sweats or unintended weight loss. Resp: Denies cough, wheezing, shortness of breath. Cardiac: Denies chest pain, palpitations, orthopnea, paroxysmal nocturnal dyspnea. GI: Denies abdominal pain, nausea, vomiting, diarrhea or constipation GU: Denies dysuria, frequency, hesitancy or incontinence MS: Denies muscle aches, joint pain or swelling Neuro: Denies headache, neurologic deficits (focal weakness, numbness, tingling), abnormal gait Psych: Denies anxiety, depression, SI/HI/AVH Skin: Denies new rashes or lesions ID: Denies sick contacts, exotic exposures, travel  Examination:  General exam: Appears calm and comfortable  Respiratory system: Clear to auscultation. Respiratory effort normal. Cardiovascular system: S1 & S2 heard, RRR. No JVD, murmurs, rubs, gallops or clicks. No pedal edema. Gastrointestinal system: Abdomen is nondistended, soft and nontender. No organomegaly or masses felt. Normal bowel  sounds  heard. Central nervous system: Alert and oriented. No focal neurological deficits. Extremities: Symmetric 5 x 5 power. Skin: LLE swelling and area of ulcer gangrene Psychiatry: Judgement and insight appear normal. Mood & affect appropriate.     Objective: Vitals:   11/11/20 1900 11/12/20 0453 11/12/20 0507 11/12/20 0730  BP: (!) 133/114 (!) 87/64 (!) 116/94 96/61  Pulse: 94 85 85 79  Resp: (!) 22 20 20 17   Temp:  98.2 F (36.8 C) 98 F (36.7 C) 98.6 F (37 C)  TempSrc:  Oral Oral Oral  SpO2: (!) 80% 93% 94% 94%  Weight:      Height:        Intake/Output Summary (Last 24 hours) at 11/12/2020 0831 Last data filed at 11/12/2020 6144 Gross per 24 hour  Intake 1267.53 ml  Output --  Net 1267.53 ml   Filed Weights   11/11/20 1452  Weight: 82 kg     Data Reviewed:   CBC: Recent Labs  Lab 11/11/20 1513 11/12/20 0615  WBC 8.2 5.9  HGB 10.4* 10.2*  HCT 31.6* 30.7*  MCV 87.3 87.0  PLT 191 315   Basic Metabolic Panel: Recent Labs  Lab 11/11/20 1513 11/12/20 0615  NA 130* 134*  K 4.5 4.4  CL 94* 100  CO2 27 27  GLUCOSE 308* 111*  BUN 20 15  CREATININE 1.05 0.91  CALCIUM 8.2* 8.0*   GFR: Estimated Creatinine Clearance: 81.3 mL/min (by C-G formula based on SCr of 0.91 mg/dL). Liver Function Tests: Recent Labs  Lab 11/11/20 1513  AST 23  ALT 22  ALKPHOS 56  BILITOT 1.2  PROT 6.0*  ALBUMIN 2.7*   No results for input(s): LIPASE, AMYLASE in the last 168 hours. No results for input(s): AMMONIA in the last 168 hours. Coagulation Profile: Recent Labs  Lab 11/12/20 0615  INR 1.2   Cardiac Enzymes: No results for input(s): CKTOTAL, CKMB, CKMBINDEX, TROPONINI in the last 168 hours. BNP (last 3 results) No results for input(s): PROBNP in the last 8760 hours. HbA1C: No results for input(s): HGBA1C in the last 72 hours. CBG: Recent Labs  Lab 11/11/20 2157  GLUCAP 173*   Lipid Profile: No results for input(s): CHOL, HDL, LDLCALC, TRIG, CHOLHDL,  LDLDIRECT in the last 72 hours. Thyroid Function Tests: Recent Labs    11/11/20 1845  TSH 1.838   Anemia Panel: No results for input(s): VITAMINB12, FOLATE, FERRITIN, TIBC, IRON, RETICCTPCT in the last 72 hours. Sepsis Labs: Recent Labs  Lab 11/11/20 1513 11/11/20 1638 11/11/20 1845  PROCALCITON  --   --  0.34  LATICACIDVEN 1.1 2.1* 1.2    Recent Results (from the past 240 hour(s))  Blood culture (routine x 2)     Status: None (Preliminary result)   Collection Time: 11/11/20  3:13 PM   Specimen: BLOOD  Result Value Ref Range Status   Specimen Description BLOOD RIGHT ANTECUBITAL  Final   Special Requests   Final    BOTTLES DRAWN AEROBIC AND ANAEROBIC Blood Culture adequate volume   Culture  Setup Time   Final    GRAM POSITIVE COCCI IN BOTH AEROBIC AND ANAEROBIC BOTTLES Organism ID to follow CRITICAL RESULT CALLED TO, READ BACK BY AND VERIFIED WITHVioleta Gelinas [facility] 4008 10/28/20 HNM Performed at [facility], [address] [place], [place] [address]    Culture [facility] POSITIVE COCCI  Final   Report Status PENDING  Incomplete  Blood Culture ID Panel (Reflexed)     Status: Abnormal  Collection Time: 11/11/20  3:13 PM  Result Value Ref Range Status   Enterococcus faecalis DETECTED (A) NOT DETECTED Final    Comment: CRITICAL RESULT CALLED TO, READ BACK BY AND VERIFIED WITH: Violeta Gelinas PHAM[facility] [id number] 11/12/20 HNM    Enterococcus Faecium NOT DETECTED NOT DETECTED Final   Listeria monocytogenes NOT DETECTED NOT DETECTED Final   Staphylococcus species NOT DETECTED NOT DETECTED Final   Staphylococcus aureus (BCID) NOT DETECTED NOT DETECTED Final   Staphylococcus epidermidis NOT DETECTED NOT DETECTED Final   Staphylococcus lugdunensis NOT DETECTED NOT DETECTED Final   Streptococcus species NOT DETECTED NOT DETECTED Final   Streptococcus agalactiae NOT DETECTED NOT DETECTED Final   Streptococcus pneumoniae NOT DETECTED NOT DETECTED Final   Streptococcus pyogenes NOT  DETECTED NOT DETECTED Final   A.calcoaceticus-baumannii NOT DETECTED NOT DETECTED Final   Bacteroides fragilis NOT DETECTED NOT DETECTED Final   Enterobacterales NOT DETECTED NOT DETECTED Final   Enterobacter cloacae complex NOT DETECTED NOT DETECTED Final   Escherichia coli NOT DETECTED NOT DETECTED Final   Klebsiella aerogenes NOT DETECTED NOT DETECTED Final   Klebsiella oxytoca NOT DETECTED NOT DETECTED Final   Klebsiella pneumoniae NOT DETECTED NOT DETECTED Final   Proteus species NOT DETECTED NOT DETECTED Final   Salmonella species NOT DETECTED NOT DETECTED Final   Serratia marcescens NOT DETECTED NOT DETECTED Final   Haemophilus influenzae NOT DETECTED NOT DETECTED Final   Neisseria meningitidis NOT DETECTED NOT DETECTED Final   Pseudomonas aeruginosa NOT DETECTED NOT DETECTED Final   Stenotrophomonas maltophilia NOT DETECTED NOT DETECTED Final   Candida albicans NOT DETECTED NOT DETECTED Final   Candida auris NOT DETECTED NOT DETECTED Final   Candida glabrata NOT DETECTED NOT DETECTED Final   Candida krusei NOT DETECTED NOT DETECTED Final   Candida parapsilosis NOT DETECTED NOT DETECTED Final   Candida tropicalis NOT DETECTED NOT DETECTED Final   Cryptococcus neoformans/gattii NOT DETECTED NOT DETECTED Final   Vancomycin resistance NOT DETECTED NOT DETECTED Final    Comment: Performed at [facility], [address] [place], [place] [address]  Blood culture (routine x 2)     Status: None (Preliminary result)   Collection Time: 11/11/20  4:38 PM   Specimen: BLOOD  Result Value Ref Range Status   Specimen Description BLOOD LEFT ANTECUBITAL  Final   Special Requests   Final    BOTTLES DRAWN AEROBIC AND ANAEROBIC Blood Culture results may not be optimal due to an inadequate volume of blood received in culture bottles   Culture  Setup Time   Final    GRAM POSITIVE COCCI IN BOTH AEROBIC AND ANAEROBIC BOTTLES CRITICAL VALUE NOTED.  VALUE IS CONSISTENT WITH PREVIOUSLY  REPORTED AND CALLED VALUE. Performed at [facility], [address] [place], [place] [address]    Culture [facility] POSITIVE COCCI  Final   Report Status PENDING  Incomplete  Resp Panel by RT-PCR (Flu A&B, Covid) Nasopharyngeal Swab     Status: None   Collection Time: 11/11/20  6:45 PM   Specimen: Nasopharyngeal Swab; Nasopharyngeal(NP) swabs in vial transport medium  Result Value Ref Range Status   SARS Coronavirus 2 by RT PCR NEGATIVE NEGATIVE Final    Comment: (NOTE) SARS-CoV-2 target nucleic acids are NOT DETECTED.  The SARS-CoV-2 RNA is generally detectable in upper respiratory specimens during the acute phase of infection. The lowest concentration of SARS-CoV-2 viral copies this assay can detect is 138 copies/mL. A negative result does not preclude SARS-Cov-2 infection and should not be  used as the sole basis for treatment or other patient management decisions. A negative result may occur with  improper specimen collection/handling, submission of specimen other than nasopharyngeal swab, presence of viral mutation(s) within the areas targeted by this assay, and inadequate number of viral copies(<138 copies/mL). A negative result must be combined with clinical observations, patient history, and epidemiological information. The expected result is Negative.  Fact Sheet for Patients:  EntrepreneurPulse.com.au  Fact Sheet for Healthcare Providers:  IncredibleEmployment.be  This test is no t yet approved or cleared by the [place] FDA and  has been authorized for detection and/or diagnosis of SARS-CoV-2 by FDA under an Emergency Use Authorization (EUA). This EUA will remain  in effect (meaning this test can be used) for the duration of the COVID-19 declaration under Section 564(b)(1) of the Act, 21 U.S.C.section 360bbb-3(b)(1), unless the authorization is terminated  or revoked sooner.       Influenza A by PCR NEGATIVE NEGATIVE  Final   Influenza B by PCR NEGATIVE NEGATIVE Final    Comment: (NOTE) The Xpert Xpress SARS-CoV-2/FLU/RSV plus assay is intended as an aid in the diagnosis of influenza from Nasopharyngeal swab specimens and should not be used as a sole basis for treatment. Nasal washings and aspirates are unacceptable for Xpert Xpress SARS-CoV-2/FLU/RSV testing.  Fact Sheet for Patients: EntrepreneurPulse.com.au  Fact Sheet for Healthcare Providers: IncredibleEmployment.be  This test is not yet approved or cleared by the [place] FDA and has been authorized for detection and/or diagnosis of SARS-CoV-2 by FDA under an Emergency Use Authorization (EUA). This EUA will remain in effect (meaning this test can be used) for the duration of the COVID-19 declaration under Section 564(b)(1) of the Act, 21 U.S.C. section 360bbb-3(b)(1), unless the authorization is terminated or revoked.  Performed at [facility], [address] [place], [place] [address]          Radiology Studies: DG Chest 2 View  Result Date: 11/11/2020 CLINICAL DATA:  Short of breath for 3 weeks, left leg swelling, hypotensive EXAM: CHEST - 2 VIEW COMPARISON:  09/29/2018 FINDINGS: Frontal and lateral views of the chest demonstrate an unremarkable cardiac silhouette. Lungs are hyperinflated with background interstitial prominence consistent with emphysema. No acute airspace disease, effusion, or pneumothorax. No acute bony abnormalities. IMPRESSION: 1. Emphysema, no acute process. Electronically Signed   By: Randa Ngo M.D.   On: 11/11/2020 16:01   CT Angio Chest PE W/Cm &/Or Wo Cm  Result Date: 11/11/2020 CLINICAL DATA:  Shortness of breath EXAM: CT ANGIOGRAPHY CHEST WITH CONTRAST TECHNIQUE: Multidetector CT imaging of the chest was performed using the standard protocol during bolus administration of intravenous contrast. Multiplanar CT image reconstructions and MIPs were  obtained to evaluate the vascular anatomy. CONTRAST:  151mL OMNIPAQUE IOHEXOL 350 MG/ML SOLN COMPARISON:  None. FINDINGS: Cardiovascular: No filling defects in the pulmonary arteries to suggest pulmonary emboli. Heart is normal size. Aorta is normal caliber. Aortic and coronary artery calcifications. Mediastinum/Nodes: Mildly prominent/enlarged mediastinal lymph nodes. Left AP window lymph node has a short axis diameter of 14 mm. Findings similar to prior study. No axillary or hilar adenopathy. Lungs/Pleura: Trace bilateral pleural effusions. Bibasilar atelectasis. Emphysema. Upper Abdomen: Imaging into the upper abdomen demonstrates no acute findings. Musculoskeletal: Chest wall soft tissues are unremarkable. No acute bony abnormality. Review of the MIP images confirms the above findings. IMPRESSION: No evidence of pulmonary embolus. Trace bilateral pleural effusions, bibasilar atelectasis. Coronary artery disease. Aortic Atherosclerosis (ICD10-I70.0) and Emphysema (ICD10-J43.9). Electronically Signed  By: Rolm Baptise M.D.   On: 11/11/2020 16:55   US Venous Img Lower Unilateral Left  Result Date: 11/11/2020 CLINICAL DATA:  Leg pain and swelling. EXAM: LEFT LOWER EXTREMITY VENOUS DOPPLER ULTRASOUND TECHNIQUE: Gray-scale sonography with compression, as well as color and duplex ultrasound, were performed to evaluate the deep venous system(s) from the level of the common femoral vein through the popliteal and proximal calf veins. COMPARISON:  None. FINDINGS: VENOUS Normal compressibility of the common femoral, superficial femoral, and popliteal veins, as well as the visualized calf veins. Visualized portions of the great saphenous vein unremarkable. No filling defects to suggest DVT on grayscale or color Doppler imaging. Doppler waveforms show normal direction of venous flow, normal respiratory plasticity and response to augmentation. Profunda femoral vein not visualized. Peroneal veins not well visualized.  Limited views of the contralateral common femoral vein are unremarkable. OTHER None. IMPRESSION: No DVT noted as discussed above. Electronically Signed   By: [place]   On: 11/11/2020 17:15   DG Foot 2 Views Left  Result Date: 11/11/2020 CLINICAL DATA:  Leg pain and swelling. EXAM: LEFT FOOT - 2 VIEW COMPARISON:  No recent. FINDINGS: Prominent diffuse soft tissue swelling. Soft tissue wound is noted adjacent to the left great toe and over the heel. No radiopaque foreign body. Peripheral vascular calcification. Prior amputations left second, third, and fifth digits. Prominent degenerative change first MTP joint. No acute bony abnormality identified. No evidence of fracture or dislocation. No bony erosions noted. If osteomyelitis is a clinical concern MRI can be obtained. IMPRESSION: 1. Prominent diffuse soft tissue swelling. Soft tissue wounds noted adjacent to the left great toe and over the heel. No radiopaque foreign body. Peripheral vascular disease. 2. Prior amputations left second, third, and fifth digits. Prominent degenerative change first MTP joint. No acute bony abnormality identified. No bony erosions noted. If osteomyelitis is suspected MRI can be obtained. Electronically Signed   By: [place]   On: 11/11/2020 16:33        Scheduled Meds: . aspirin EC  81 mg Oral Daily  . clopidogrel  75 mg Oral Daily  . enoxaparin (LOVENOX) injection  40 mg Subcutaneous Q24H  . fluticasone  2 spray Each Nare Daily  . gabapentin  300 mg Oral TID  . insulin aspart  0-15 Units Subcutaneous TID WC  . insulin aspart  0-5 Units Subcutaneous QHS  . mometasone-formoterol  2 puff Inhalation BID  . oxybutynin  5 mg Oral BID  . pravastatin  10 mg Oral q1800   Continuous Infusions: . sodium chloride 125 mL/hr at 11/12/20 0652  . vancomycin       LOS: 1 day   Time spent= 35 mins    Gailya Tauer Arsenio Loader, MD [facility]  If 7PM-7AM, please contact night-coverage  11/12/2020,  8:31 AM

## 2020-11-13 ENCOUNTER — Inpatient Hospital Stay: Payer: Medicare HMO

## 2020-11-13 DIAGNOSIS — I10 Essential (primary) hypertension: Secondary | ICD-10-CM | POA: Diagnosis not present

## 2020-11-13 DIAGNOSIS — B952 Enterococcus as the cause of diseases classified elsewhere: Secondary | ICD-10-CM | POA: Diagnosis not present

## 2020-11-13 DIAGNOSIS — E1165 Type 2 diabetes mellitus with hyperglycemia: Secondary | ICD-10-CM | POA: Diagnosis not present

## 2020-11-13 DIAGNOSIS — B9561 Methicillin susceptible Staphylococcus aureus infection as the cause of diseases classified elsewhere: Secondary | ICD-10-CM | POA: Diagnosis not present

## 2020-11-13 DIAGNOSIS — S91302A Unspecified open wound, left foot, initial encounter: Secondary | ICD-10-CM

## 2020-11-13 DIAGNOSIS — A419 Sepsis, unspecified organism: Secondary | ICD-10-CM | POA: Diagnosis not present

## 2020-11-13 DIAGNOSIS — I428 Other cardiomyopathies: Secondary | ICD-10-CM | POA: Diagnosis not present

## 2020-11-13 DIAGNOSIS — R7881 Bacteremia: Secondary | ICD-10-CM | POA: Diagnosis not present

## 2020-11-13 LAB — GLUCOSE, CAPILLARY
Glucose-Capillary: 139 mg/dL — ABNORMAL HIGH (ref 70–99)
Glucose-Capillary: 183 mg/dL — ABNORMAL HIGH (ref 70–99)
Glucose-Capillary: 144 mg/dL — ABNORMAL HIGH (ref 70–99)
Glucose-Capillary: 163 mg/dL — ABNORMAL HIGH (ref 70–99)

## 2020-11-13 LAB — BASIC METABOLIC PANEL
Anion gap: 9 (ref 5–15)
BUN: 13 mg/dL (ref 8–23)
CO2: 26 mmol/L (ref 22–32)
Calcium: 7.9 mg/dL — ABNORMAL LOW (ref 8.9–10.3)
Chloride: 97 mmol/L — ABNORMAL LOW (ref 98–111)
Creatinine, Ser: 0.97 mg/dL (ref 0.61–1.24)
GFR, Estimated: >60 mL/min (ref >60)
Glucose, Bld: 148 mg/dL — ABNORMAL HIGH (ref 70–99)
Potassium: 4 mmol/L (ref 3.5–5.1)
Sodium: 132 mmol/L — ABNORMAL LOW (ref 135–145)

## 2020-11-13 LAB — BLOOD CULTURE ID PANEL (REFLEXED) - BCID2

## 2020-11-13 LAB — CBC
HCT: 29.4 % — ABNORMAL LOW (ref 39.0–52.0)
Hemoglobin: 9.4 g/dL — ABNORMAL LOW (ref 13.0–17.0)
MCH: 28.2 pg (ref 26.0–34.0)
MCHC: 32 g/dL (ref 30.0–36.0)
MCV: 88.3 fL (ref 80.0–100.0)
Platelets: 149 10*3/uL — ABNORMAL LOW (ref 150–400)
RBC: 3.33 MIL/uL — ABNORMAL LOW (ref 4.22–5.81)
RDW: 13.2 % (ref 11.5–15.5)
WBC: 5.2 10*3/uL (ref 4.0–10.5)
nRBC: 0 % (ref 0.0–0.2)

## 2020-11-13 LAB — RETICULOCYTES
Immature Retic Fract: 22.4 % — ABNORMAL HIGH (ref 2.3–15.9)
RBC.: 3.36 MIL/uL — ABNORMAL LOW (ref 4.22–5.81)
Retic Count, Absolute: 96.4 10*3/uL (ref 19.0–186.0)
Retic Ct Pct: 2.9 % (ref 0.4–3.1)

## 2020-11-13 LAB — MAGNESIUM: Magnesium: 2 mg/dL (ref 1.7–2.4)

## 2020-11-13 LAB — LACTATE DEHYDROGENASE: LDH: 130 U/L (ref 98–192)

## 2020-11-13 LAB — FOLATE: Folate: 16.9 ng/mL (ref >5.9)

## 2020-11-13 LAB — VITAMIN B12: Vitamin B-12: 1343 pg/mL — ABNORMAL HIGH (ref 180–914)

## 2020-11-13 LAB — FERRITIN: Ferritin: 413 ng/mL — ABNORMAL HIGH (ref 24–336)

## 2020-11-13 IMAGING — US US ABDOMEN COMPLETE
1 series · 15 of 25 positions shown · non-contrast
Comparison: None.

CLINICAL DATA: Ascites.

EXAM:
ABDOMEN ULTRASOUND COMPLETE

[Series 1: us abdomen complete · 15 of 66 slices shown]
[im 1/66]
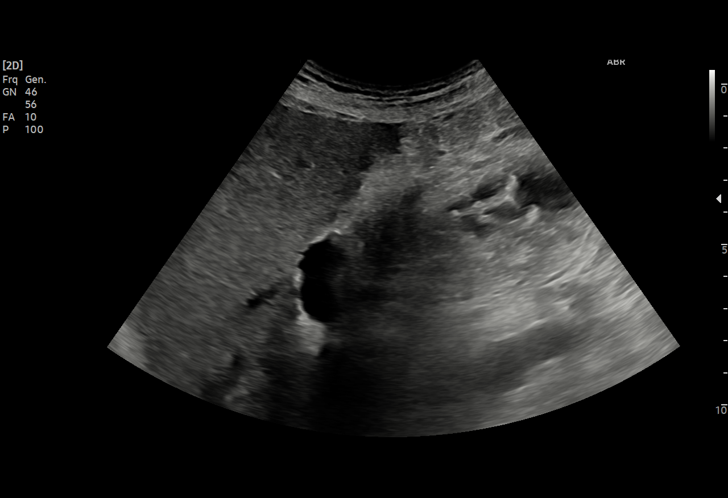
[im 6/66]
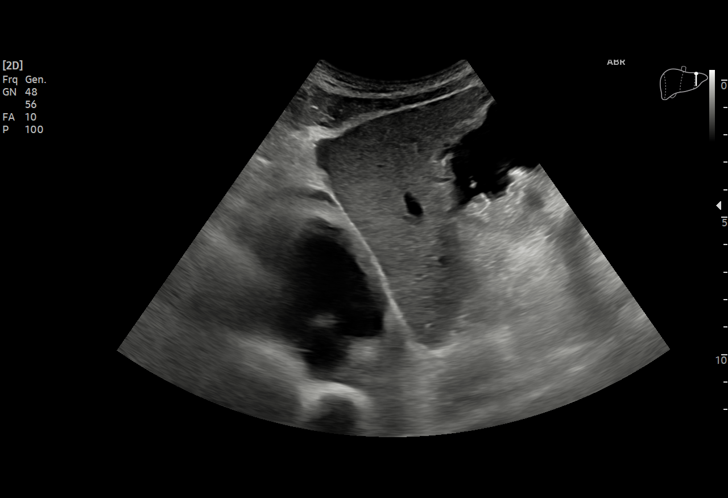
[im 11/66]
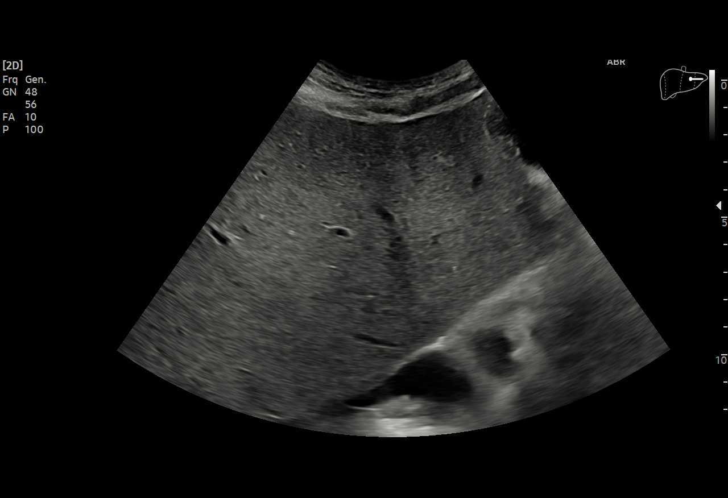
[im 14/66]
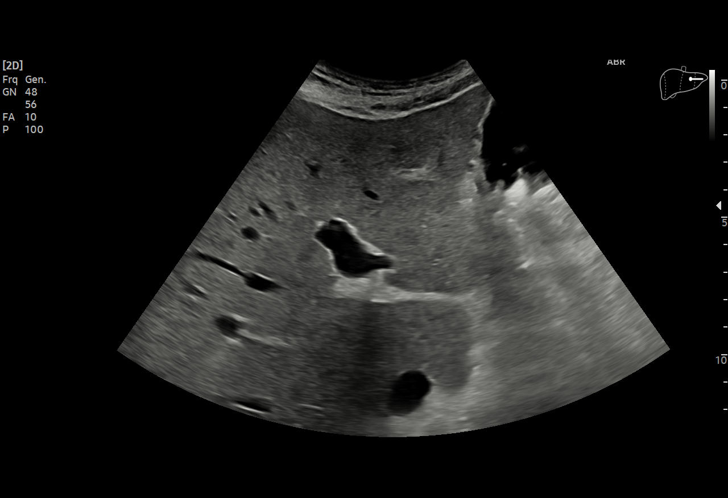
[im 19/66]
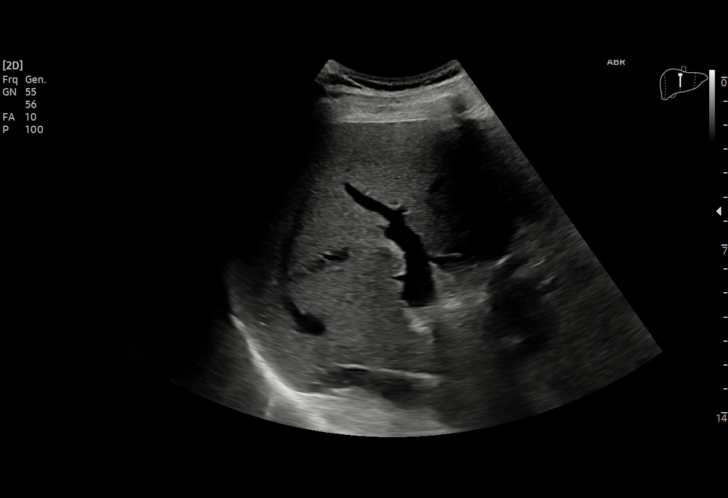
[im 25/66]
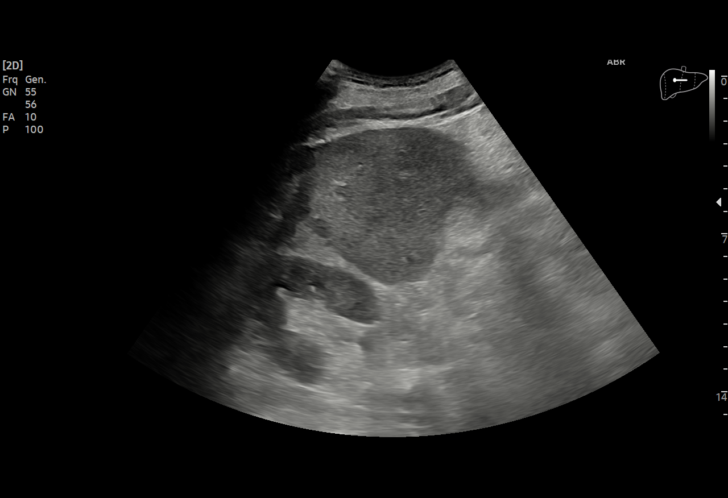
[im 28/66]
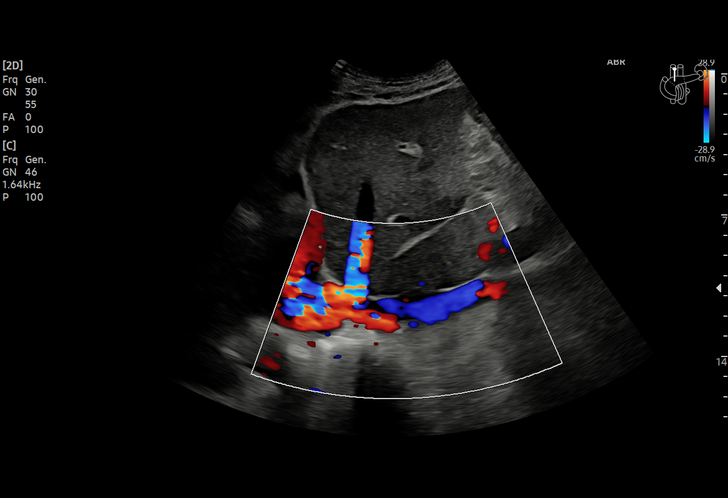
[im 33/66]
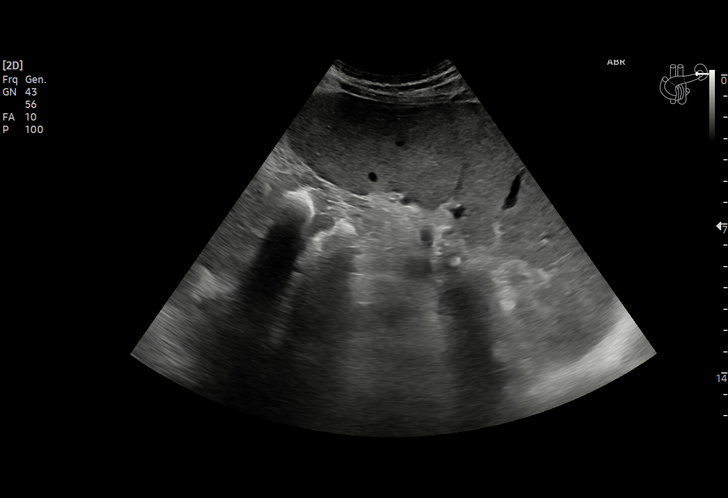
[im 38/66]
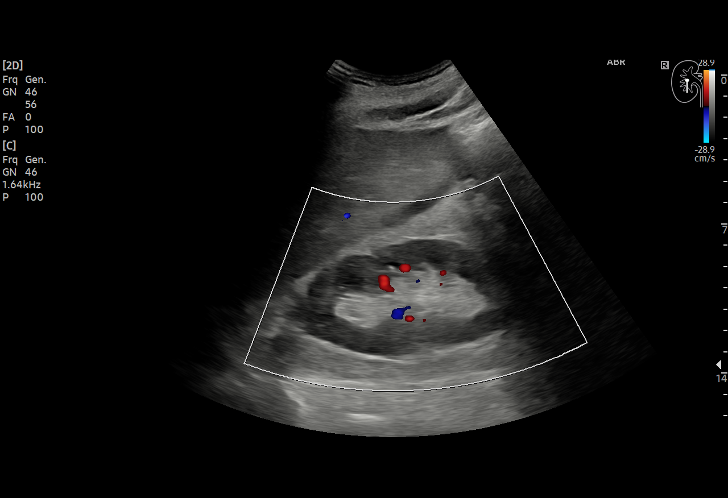
[im 41/66]
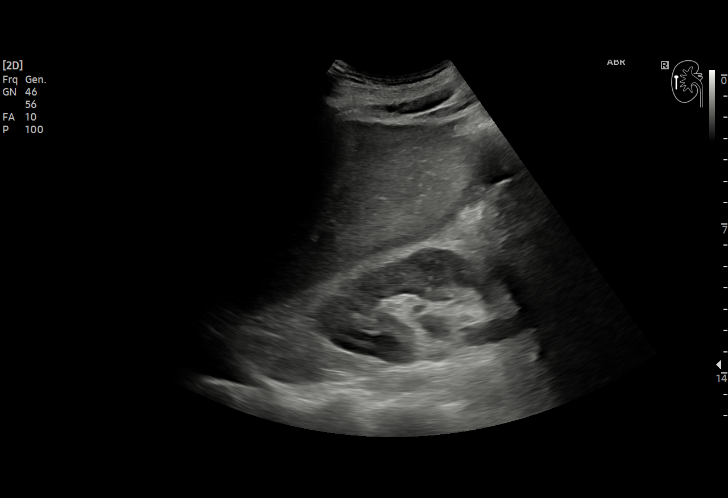
[im 47/66]
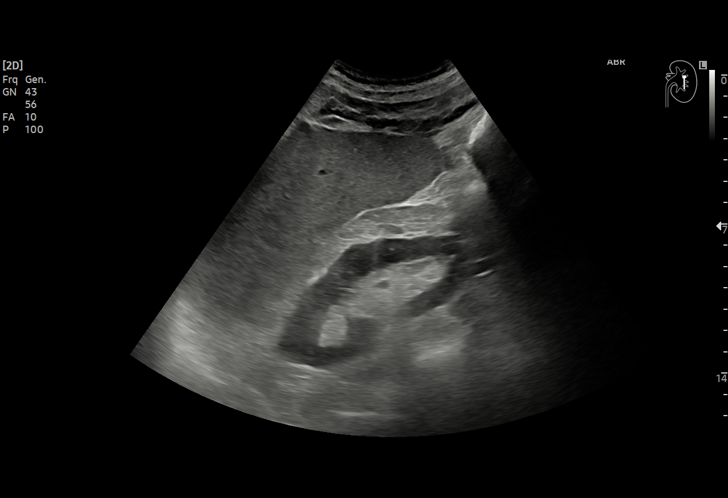
[im 52/66]
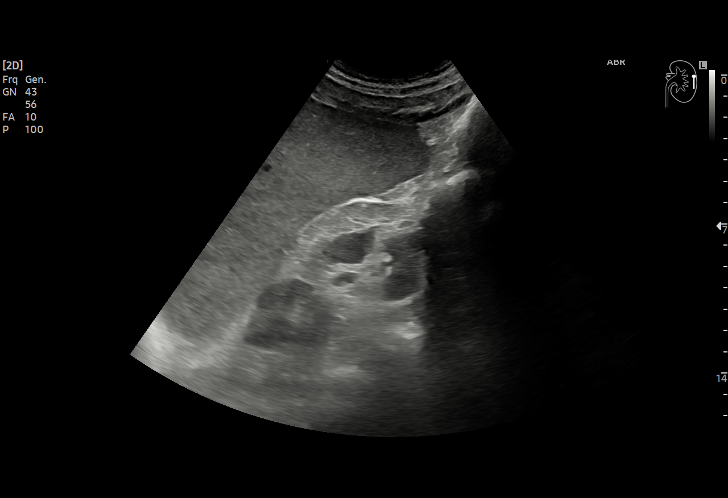
[im 55/66]
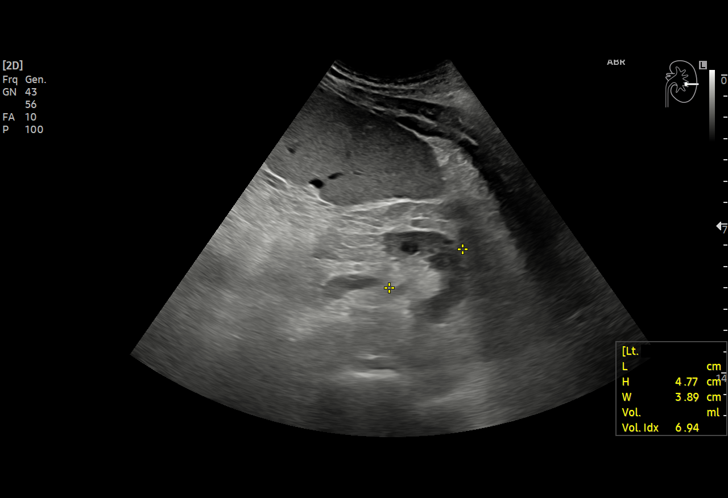
[im 60/66]
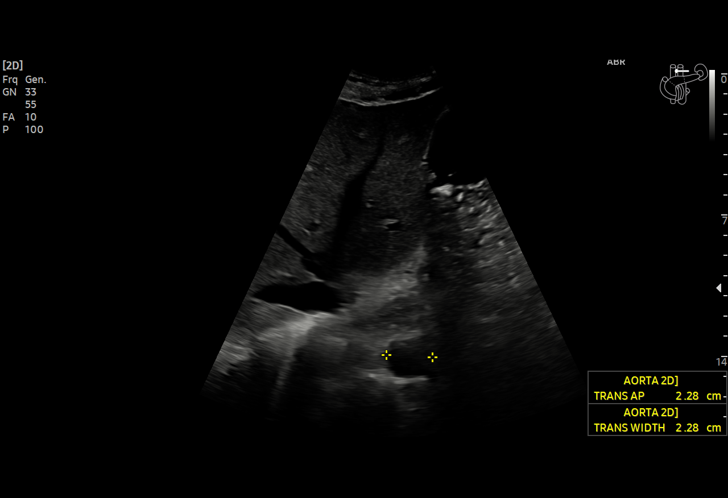
[im 66/66]
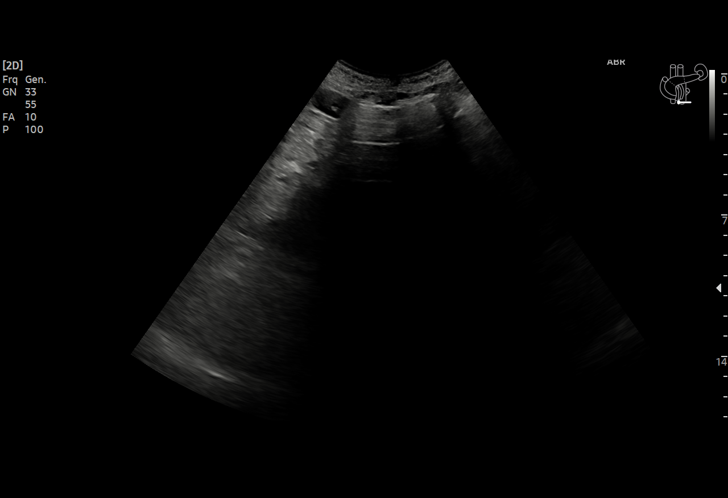

[15 of 25 positions shown; findings below may reference images not displayed]

FINDINGS: Gallbladder: Status post cholecystectomy.

Common bile duct: Diameter: 3 mm which is within normal limits.

Liver: No focal lesion identified. Within normal limits in
parenchymal echogenicity. Portal vein is patent on color Doppler
imaging with normal direction of blood flow towards the liver.

IVC: No abnormality visualized.

Pancreas: Visualized portion unremarkable.

Spleen: Maximum measured diameter of 15.7 cm with calculated volume
of 11 122 cubic cm consistent with moderate splenomegaly. No focal
abnormality is noted.

Right Kidney: Length: 10.7 cm. Echogenicity within normal limits. No
mass or hydronephrosis visualized.

Left Kidney: Length: 10.1 cm. Echogenicity within normal limits. No
mass or hydronephrosis visualized.

Abdominal aorta: No aneurysm visualized.

Other findings: Possible minimal ascites is noted around the liver.
IMPRESSION: Status post cholecystectomy. Possible minimal ascites. No other
abnormality seen in the abdomen.

## 2020-11-13 MED ORDER — SENNOSIDES-DOCUSATE SODIUM 8.6-50 MG PO TABS: 1.0000 | ORAL_TABLET | Freq: Every evening | ORAL | Status: DC | PRN

## 2020-11-13 MED ORDER — SODIUM CHLORIDE 0.9 % IV SOLN: INTRAVENOUS | Status: DC

## 2020-11-13 MED ORDER — PANTOPRAZOLE SODIUM 40 MG IV SOLR
40.0000 mg | Freq: Every day | INTRAVENOUS | Status: DC
  Administered 2020-11-14 – 2020-11-17 (×2): 40 mg via INTRAVENOUS
  Filled 2020-11-13 (×2): qty 40

## 2020-11-13 MED ORDER — SODIUM CHLORIDE 0.9 % IV SOLN
650.0000 mg | Freq: Every day | INTRAVENOUS | Status: DC
  Administered 2020-11-13 – 2020-11-18 (×6): 650 mg via INTRAVENOUS
  Filled 2020-11-13 (×9): qty 13

## 2020-11-13 MED ORDER — POLYETHYLENE GLYCOL 3350 17 G PO PACK
17.0000 g | PACK | Freq: Every day | ORAL | Status: DC | PRN
  Administered 2020-11-13 – 2020-11-15 (×3): 17 g via ORAL
  Filled 2020-11-13 (×3): qty 1

## 2020-11-13 MED ORDER — PANTOPRAZOLE SODIUM 40 MG PO TBEC
40.0000 mg | DELAYED_RELEASE_TABLET | Freq: Every day | ORAL | Status: DC
  Administered 2020-11-13 – 2020-11-24 (×10): 40 mg via ORAL
  Filled 2020-11-13 (×10): qty 1

## 2020-11-13 NOTE — Progress Notes (Signed)
 Vein & Vascular Surgery Daily Progress Note   Will plan on left lower extremity angiogram on Monday with Dr. Lucky Cowboy.  Discussed with Dr. Ellis Parents Santiago Stenzel PA-C 11/13/2020 11:16 AM

## 2020-11-13 NOTE — Evaluation (Signed)
Physical Therapy Evaluation Patient Details Name: [name] MRN: [id number] DOB: 04-04-1951 Today's Date: 11/13/2020   History of Present Illness  [name] is a 70 y.o. male with medical history significant for hypertension, nonischemic cardiomyopathy, non-insulin-dependent DM, peripheral neuropathy, and PAD, who presents to the ED following a visit with his PCP for SOB and swelling and pain in L LE. He endorses weakness and shortness of breath with exertion and increased sleeping and poor PO intake. He reports this has been ongoing for the last 2 weeks. He further endorses nausea and diarrhea x 2 weeks, with worsening the in last 2 days prompting evaluation at PCP. Pt reports unchanged baseline dry cough. He denies chest pain, abdominal pain, dysuria, hematuria. He endorses left lower extremity edema that has been ongoing for the last 2 weeks and worsening in the last 2 days. At PCP, he was advised to present to the ED due to his left lower extremity swelling and shortness of breath. Pt reports he was aware of the diabetic foot ulcer on the medial great toe, however he was unaware of the wound on the left heel.  Clinical Impression  Pt is a pleasant 70 year old male who was admitted for sepsis with complaints of SOB x 3 weeks and L LE swelling. Pt performs transfers and ambulation with cga and RW. Able to maintain correct WBing status with upright posture, although is impulsive. Pt demonstrates deficits with mobility/balance/endurance. Would benefit from skilled PT to address above deficits and promote optimal return to PLOF. Recommend transition to [place] upon discharge from acute hospitalization.     Follow Up Recommendations Home health PT    Equipment Recommendations  None recommended by PT (has all equipment)    Recommendations for Other Services       Precautions / Restrictions Precautions Precautions: Fall Restrictions Weight Bearing Restrictions: Yes LLE Weight Bearing:  Touchdown weight bearing Other Position/Activity Restrictions: no heel contact. needs surgical shoe (size 11 shoe)      Mobility  Bed Mobility Overal bed mobility: Modified Independent             General bed mobility comments: received at EOB    Transfers Overall transfer level: Needs assistance Equipment used: Rolling walker (2 wheeled) Transfers: Sit to/from Stand Sit to Stand: Min guard         General transfer comment: safe technique with ability to maintain correct WBing status. Often prefers to be NWB.  Ambulation/Gait Ambulation/Gait assistance: Min guard Gait Distance (Feet): 10 Feet Assistive device: Rolling walker (2 wheeled) Gait Pattern/deviations: Step-to pattern     General Gait Details: ambulated to [facility] for transport to next room. Safe technique with ability to maintain correct WBing, ranging from NWB to TTWB. Cues given for sequencing.  Stairs            Wheelchair Mobility    Modified Rankin (Stroke Patients Only)       Balance Overall balance assessment: Modified Independent                                           Pertinent Vitals/Pain Pain Assessment: No/denies pain Pain Score: 8  Pain Location: L LE. Pt reports 8/10 pain with movement, but little/no pain in static supine position Pain Intervention(s): Limited activity within patient's tolerance;Monitored during session    [place] expects to be discharged to:: Private residence Living  Arrangements: Spouse/significant other Available Help at Discharge: Family;Friend(s);Available PRN/intermittently Type of Home: Mobile home Home Access: Ramped entrance     Home Layout: One level Home Equipment: [place] - 2 wheels;Walker - 4 wheels;Bedside commode;Cane - single point;Cane - quad;Crutches;Wheelchair - power Additional Comments: indep prior, reports no falls. has equipment at home if needed    Prior Function Level of Independence: Independent                Hand Dominance        Extremity/Trunk Assessment   Upper Extremity Assessment Upper Extremity Assessment: Overall WFL for tasks assessed    Lower Extremity Assessment Lower Extremity Assessment: Overall WFL for tasks assessed LLE Deficits / Details: diabetic foot ulcer, medial to L great toe and on L heel       Communication   Communication: No difficulties  Cognition Arousal/Alertness: Awake/alert Behavior During Therapy: WFL for tasks assessed/performed Overall Cognitive Status: Within Functional Limits for tasks assessed                                 General Comments: verbose, tends to interrupt      General Comments General comments (skin integrity, edema, etc.): Swelling, BLE    Exercises Other Exercises Other Exercises: Bed mobility, grooming, UE therex, educ re: role of OT, DM mgmt, foot care Other Exercises: Multiple sit<>Stands performed with CGA and RW. Cues for pushing from bed. Able to push from bed with one hand. Other Exercises: Seated ther-ex performed on B LE including alt. marching, LAQ x 10 reps with supervision   Assessment/Plan    PT Assessment Patient needs continued PT services  PT Problem List Decreased balance;Decreased mobility;Decreased safety awareness       PT Treatment Interventions DME instruction;Gait training;Balance training;Therapeutic exercise    PT Goals (Current goals can be found in the Care Plan section)  Acute Rehab PT Goals Patient Stated Goal: to get home PT Goal Formulation: With patient Time For Goal Achievement: 11/27/20 Potential to Achieve Goals: Good    Frequency Min 2X/week   Barriers to discharge        Co-evaluation               AM-PAC PT "6 Clicks" Mobility  Outcome Measure Help needed turning from your back to your side while in a flat bed without using bedrails?: None Help needed moving from lying on your back to sitting on the side of a flat bed without  using bedrails?: None Help needed moving to and from a bed to a chair (including a wheelchair)?: A Little Help needed standing up from a chair using your arms (e.g., wheelchair or bedside chair)?: A Little Help needed to walk in hospital room?: A Little Help needed climbing 3-5 steps with a railing? : A Little 6 Click Score: 20    End of Session   Activity Tolerance: Patient tolerated treatment well Patient left:  (in transport chair with NT) Nurse Communication: Mobility status PT Visit Diagnosis: Unsteadiness on feet (R26.81);Difficulty in walking, not elsewhere classified (R26.2)    Time: 2035-5974 PT Time Calculation (min) (ACUTE ONLY): 29 min   Charges:   PT Evaluation $PT Eval Low Complexity: 1 Low PT Treatments $Therapeutic Exercise: 8-22 mins        [name], PT, DPT [phone]   [name] 11/13/2020, 4:10 PM

## 2020-11-13 NOTE — Progress Notes (Signed)
Called report to [facility] RN Shields. Room is currently being cleaned. Will call back at 1315 to make sure room is clean and ready for patient to arrive. Notified ultrasound that patient is being moved potentially before ultrasound.

## 2020-11-13 NOTE — Progress Notes (Addendum)
[name] - BLOOD CULTURE   [name] is an 70 y.o. male who presented to [facility] on 11/11/2020 with a chief complaint of sepsis   Notified today that blood culture is growing S. Aureus in both blood cultures.  Notified Drs Reesa Chew and Delaine Lame  Patient currently on vancomycin  (plan to change to daptomycin)    Doreene Eland, PharmD, BCPS.   Work Cell: [phone] 11/13/2020 4:05 PM   ADDENDUM 11/13/2020 4:52 PM Notified that repeat Blood cultures from 2/16 with [facility] currently in 1 of 4 bottles, BCID E. Faecalis.    Notified: Dr Delaine Lame  Patient now on daptomycin   Doreene Eland, PharmD, BCPS.   Work Cell: [phone] 11/13/2020 4:53 PM

## 2020-11-13 NOTE — Progress Notes (Signed)
PROGRESS NOTE    [name]  EPP:[id number] DOB: [birth year] DOA: 11/11/2020 PCP: [name], MD   Brief Narrative:  70 year old with history of essential hypertension, nonischemic cardiomyopathy, DM2, peripheral neuropathy, PAD, history of tobacco use quit 1998 presents with shortness of breath, poor oral intake nausea, diarrhea with left lower extremity swelling with worsening diabetic foot infection.  Patient has been fully vaccinated and boosted against COVID-19. Lower extremity Dopplers negative for DVT, CTA chest showed pleural effusion/atelectasis pleural effusion and atelectasis but no PE.  Assessment & Plan:   Principal Problem:   Sepsis ([place]) Active Problems:   PAD (peripheral artery disease) ([place])   Diabetes ([place])   Mixed hyperlipidemia   Essential hypertension   GERD (gastroesophageal reflux disease)   Uncontrolled type 2 diabetes mellitus with hyperglycemia ([facility])   Cardiomyopathy, nonischemic ([place])   Primary generalized (osteo)arthritis   Moderate asthma without complication   Diabetic foot ulcer ([place])  Sepsis secondary to diabetic foot infection of his left lower extremity with ulceration and cellulitis nonpurulent Gram-positive bacteremia Left lower extremity leg pain -Sepsis physiology appears to be improving. -MRI foot-showing cellulitis and myositis but no evidence of osteomyelitis -Antibiotics-vancomycin.  Cefepime discontinued. -Podiatry-dressing changes, culture sent.  No surgical indication at this time -Pain control -ID following -Vascular-plans for angiogram on 2/18 with possible interventions -Lower extremity Dopplers-negative for DVT -Echocardiogram-EF 60 to 65% -Patient will need TEE to rule out endocarditis. Cardiology consulted- Dr Nehemiah Massed  Peripheral arterial disease -Currently on dual antiplatelet.  Aspirin Plavix and statin -Seen by vascular surgery.  Plans for angiogram and possible revascularization.  Chronic pain   -Pain  control  Shortness of breath with bilateral pleural effusion/atelectasis without hypoxia -Out of bed to chair.  Incentive spirometer.  As needed bronchodilators liters -Holding off on diuretics.  Discontinue IV fluids.  -Echocardiogram -EF 60 to 65%, no evidence of endocarditis  Diabetes mellitus type 2 -Peripheral neuropathy secondary to DM2 -Insulin sliding scale and Accu-Chek -Gabapentin 3 times daily  Hyperlipidemia Cont Statin   DVT prophylaxis: Lovenox Code Status: DNR Family Communication:  Sister at bedside  Status is: Inpatient  Remains inpatient appropriate because:Inpatient level of care appropriate due to severity of illness   Dispo: The patient is from: Home              Anticipated d/c is to: SNF              Anticipated d/c date is: > 3 days              Patient currently is not medically stable to d/c.   Difficult to place patient No    Body mass index is 25.94 kg/m.     Subjective: Feels ok, afebrile overnight.   Review of Systems Otherwise negative except as per HPI, including: General = no fevers, chills, dizziness,  fatigue HEENT/EYES = negative for loss of vision, double vision, blurred vision,  sore throa Cardiovascular= negative for chest pain, palpitation Respiratory/lungs= negative for shortness of breath, cough, wheezing; hemoptysis,  Gastrointestinal= negative for nausea, vomiting, abdominal pain Genitourinary= negative for Dysuria MSK = Negative for arthralgia, myalgias Neurology= Negative for headache, numbness, tingling  Psychiatry= Negative for suicidal and homocidal ideation Skin= Negative for Rash   Examination: Constitutional: Not in acute distress Respiratory: Clear to auscultation bilaterally Cardiovascular: Normal sinus rhythm, no rubs Abdomen: Nontender nondistended good bowel sounds Musculoskeletal: No edema noted Skin: LLE dressing noted.  Neurologic: CN 2-12 grossly intact.  And nonfocal Psychiatric: Normal  judgment  and insight. Alert and oriented x 3. Normal mood.      Objective: Vitals:   11/12/20 1909 11/13/20 0330 11/13/20 0716 11/13/20 0817  BP: 106/62 (!) 109/59 114/69   Pulse: 86 (!) 107 (!) 101   Resp: 18 20 18    Temp: 97.7 F (36.5 C) 98.5 F (36.9 C) 98.2 F (36.8 C)   TempSrc: Oral Oral Oral   SpO2: 93% 93% 91% 92%  Weight:      Height:        Intake/Output Summary (Last 24 hours) at 11/13/2020 0901 Last data filed at 11/13/2020 8182 Gross per 24 hour  Intake 1194.19 ml  Output 400 ml  Net 794.19 ml   Filed Weights   11/11/20 1452  Weight: 82 kg     Data Reviewed:   CBC: Recent Labs  Lab 11/11/20 1513 11/12/20 0615 11/13/20 0636  WBC 8.2 5.9 5.2  HGB 10.4* 10.2* 9.4*  HCT 31.6* 30.7* 29.4*  MCV 87.3 87.0 88.3  PLT 191 165 993*   Basic Metabolic Panel: Recent Labs  Lab 11/11/20 1513 11/12/20 0615 11/13/20 0636  NA 130* 134* 132*  K 4.5 4.4 4.0  CL 94* 100 97*  CO2 27 27 26   GLUCOSE 308* 111* 148*  BUN 20 15 13   CREATININE 1.05 0.91 0.97  CALCIUM 8.2* 8.0* 7.9*  MG  --   --  2.0   GFR: Estimated Creatinine Clearance: 76.3 mL/min (by C-G formula based on SCr of 0.97 mg/dL). Liver Function Tests: Recent Labs  Lab 11/11/20 1513  AST 23  ALT 22  ALKPHOS 56  BILITOT 1.2  PROT 6.0*  ALBUMIN 2.7*   No results for input(s): LIPASE, AMYLASE in the last 168 hours. No results for input(s): AMMONIA in the last 168 hours. Coagulation Profile: Recent Labs  Lab 11/12/20 0615  INR 1.2   Cardiac Enzymes: No results for input(s): CKTOTAL, CKMB, CKMBINDEX, TROPONINI in the last 168 hours. BNP (last 3 results) No results for input(s): PROBNP in the last 8760 hours. HbA1C: No results for input(s): HGBA1C in the last 72 hours. CBG: Recent Labs  Lab 11/12/20 1146 11/12/20 1706 11/12/20 2123 11/12/20 2201 11/13/20 0716  GLUCAP 84 75 62* 116* 139*   Lipid Profile: No results for input(s): CHOL, HDL, LDLCALC, TRIG, CHOLHDL, LDLDIRECT in  the last 72 hours. Thyroid Function Tests: Recent Labs    11/12/20 0900  TSH 3.259   Anemia Panel: Recent Labs    11/13/20 0636  FOLATE 16.9  FERRITIN 413*  RETICCTPCT 2.9   Sepsis Labs: Recent Labs  Lab 11/11/20 1513 11/11/20 1638 11/11/20 1845  PROCALCITON  --   --  0.34  LATICACIDVEN 1.1 2.1* 1.2    Recent Results (from the past 240 hour(s))  Blood culture (routine x 2)     Status: None (Preliminary result)   Collection Time: 11/11/20  3:13 PM   Specimen: BLOOD  Result Value Ref Range Status   Specimen Description BLOOD RIGHT ANTECUBITAL  Final   Special Requests   Final    BOTTLES DRAWN AEROBIC AND ANAEROBIC Blood Culture adequate volume   Culture  Setup Time   Final    GRAM POSITIVE COCCI IN BOTH AEROBIC AND ANAEROBIC BOTTLES Organism ID to follow CRITICAL RESULT CALLED TO, READ BACK BY AND VERIFIED WITHVioleta Gelinas [facility] [id number] 10/28/20 HNM Performed at [facility], [address] [place], [place] [address]    Culture [facility] POSITIVE COCCI  Final   Report Status PENDING  Incomplete  Blood Culture ID Panel (Reflexed)     Status: Abnormal   Collection Time: 11/11/20  3:13 PM  Result Value Ref Range Status   Enterococcus faecalis DETECTED (A) NOT DETECTED Final    Comment: CRITICAL RESULT CALLED TO, READ BACK BY AND VERIFIED WITH: Violeta Gelinas [facility] [id number] 11/12/20 HNM    Enterococcus Faecium NOT DETECTED NOT DETECTED Final   Listeria monocytogenes NOT DETECTED NOT DETECTED Final   Staphylococcus species NOT DETECTED NOT DETECTED Final   Staphylococcus aureus (BCID) NOT DETECTED NOT DETECTED Final   Staphylococcus epidermidis NOT DETECTED NOT DETECTED Final   Staphylococcus lugdunensis NOT DETECTED NOT DETECTED Final   Streptococcus species NOT DETECTED NOT DETECTED Final   Streptococcus agalactiae NOT DETECTED NOT DETECTED Final   Streptococcus pneumoniae NOT DETECTED NOT DETECTED Final   Streptococcus pyogenes NOT DETECTED NOT DETECTED Final    A.calcoaceticus-baumannii NOT DETECTED NOT DETECTED Final   Bacteroides fragilis NOT DETECTED NOT DETECTED Final   Enterobacterales NOT DETECTED NOT DETECTED Final   Enterobacter cloacae complex NOT DETECTED NOT DETECTED Final   Escherichia coli NOT DETECTED NOT DETECTED Final   Klebsiella aerogenes NOT DETECTED NOT DETECTED Final   Klebsiella oxytoca NOT DETECTED NOT DETECTED Final   Klebsiella pneumoniae NOT DETECTED NOT DETECTED Final   Proteus species NOT DETECTED NOT DETECTED Final   Salmonella species NOT DETECTED NOT DETECTED Final   Serratia marcescens NOT DETECTED NOT DETECTED Final   Haemophilus influenzae NOT DETECTED NOT DETECTED Final   Neisseria meningitidis NOT DETECTED NOT DETECTED Final   Pseudomonas aeruginosa NOT DETECTED NOT DETECTED Final   Stenotrophomonas maltophilia NOT DETECTED NOT DETECTED Final   Candida albicans NOT DETECTED NOT DETECTED Final   Candida auris NOT DETECTED NOT DETECTED Final   Candida glabrata NOT DETECTED NOT DETECTED Final   Candida krusei NOT DETECTED NOT DETECTED Final   Candida parapsilosis NOT DETECTED NOT DETECTED Final   Candida tropicalis NOT DETECTED NOT DETECTED Final   Cryptococcus neoformans/gattii NOT DETECTED NOT DETECTED Final   Vancomycin resistance NOT DETECTED NOT DETECTED Final    Comment: Performed at [facility], [address] [place], [place] [address]  Blood culture (routine x 2)     Status: None (Preliminary result)   Collection Time: 11/11/20  4:38 PM   Specimen: BLOOD  Result Value Ref Range Status   Specimen Description BLOOD LEFT ANTECUBITAL  Final   Special Requests   Final    BOTTLES DRAWN AEROBIC AND ANAEROBIC Blood Culture results may not be optimal due to an inadequate volume of blood received in culture bottles   Culture  Setup Time   Final    GRAM POSITIVE COCCI IN BOTH AEROBIC AND ANAEROBIC BOTTLES CRITICAL VALUE NOTED.  VALUE IS CONSISTENT WITH PREVIOUSLY REPORTED AND CALLED  VALUE. Performed at [facility], [address] [place], [place] [address]    Culture [facility] POSITIVE COCCI  Final   Report Status PENDING  Incomplete  Resp Panel by RT-PCR (Flu A&B, Covid) Nasopharyngeal Swab     Status: None   Collection Time: 11/11/20  6:45 PM   Specimen: Nasopharyngeal Swab; Nasopharyngeal(NP) swabs in vial transport medium  Result Value Ref Range Status   SARS Coronavirus 2 by RT PCR NEGATIVE NEGATIVE Final    Comment: (NOTE) SARS-CoV-2 target nucleic acids are NOT DETECTED.  The SARS-CoV-2 RNA is generally detectable in upper respiratory specimens during the acute phase of infection. The lowest concentration of SARS-CoV-2 viral copies this assay can detect  is 138 copies/mL. A negative result does not preclude SARS-Cov-2 infection and should not be used as the sole basis for treatment or other patient management decisions. A negative result may occur with  improper specimen collection/handling, submission of specimen other than nasopharyngeal swab, presence of viral mutation(s) within the areas targeted by this assay, and inadequate number of viral copies(<138 copies/mL). A negative result must be combined with clinical observations, patient history, and epidemiological information. The expected result is Negative.  Fact Sheet for Patients:  EntrepreneurPulse.com.au  Fact Sheet for Healthcare Providers:  IncredibleEmployment.be  This test is no t yet approved or cleared by the [place] FDA and  has been authorized for detection and/or diagnosis of SARS-CoV-2 by FDA under an Emergency Use Authorization (EUA). This EUA will remain  in effect (meaning this test can be used) for the duration of the COVID-19 declaration under Section 564(b)(1) of the Act, 21 U.S.C.section 360bbb-3(b)(1), unless the authorization is terminated  or revoked sooner.       Influenza A by PCR NEGATIVE NEGATIVE Final   Influenza B  by PCR NEGATIVE NEGATIVE Final    Comment: (NOTE) The Xpert Xpress SARS-CoV-2/FLU/RSV plus assay is intended as an aid in the diagnosis of influenza from Nasopharyngeal swab specimens and should not be used as a sole basis for treatment. Nasal washings and aspirates are unacceptable for Xpert Xpress SARS-CoV-2/FLU/RSV testing.  Fact Sheet for Patients: EntrepreneurPulse.com.au  Fact Sheet for Healthcare Providers: IncredibleEmployment.be  This test is not yet approved or cleared by the [place] FDA and has been authorized for detection and/or diagnosis of SARS-CoV-2 by FDA under an Emergency Use Authorization (EUA). This EUA will remain in effect (meaning this test can be used) for the duration of the COVID-19 declaration under Section 564(b)(1) of the Act, 21 U.S.C. section 360bbb-3(b)(1), unless the authorization is terminated or revoked.  Performed at [facility], [address] [place], [place] [address]   Aerobic/Anaerobic Culture w Gram Stain (surgical/deep wound)     Status: None (Preliminary result)   Collection Time: 11/12/20  1:01 PM   Specimen: Wound  Result Value Ref Range Status   Specimen Description   Final    WOUND Performed at [facility], [address], [place], [place] [address]    Special Requests   Final    LEFT FOOT Performed at [facility], [address] [place], [place] [address]    Gram Stain   Final    NO WBC SEEN ABUNDANT GRAM POSITIVE COCCI Performed at [facility], [place] [address] [place], [place] [address]    Culture PENDING  Incomplete   Report Status PENDING  Incomplete  Culture, blood (Routine X 2) w Reflex to ID Panel     Status: None (Preliminary result)   Collection Time: 11/12/20 11:53 PM   Specimen: BLOOD  Result Value Ref Range Status   Specimen Description BLOOD BLOOD LEFT HAND  Final   Special Requests   Final    BOTTLES DRAWN AEROBIC AND  ANAEROBIC Blood Culture adequate volume   Culture   Final    NO GROWTH < 12 HOURS Performed at [facility], [address] [place],  [address]    Report Status PENDING  Incomplete  Culture, blood (Routine X 2) w Reflex to ID Panel     Status: None (Preliminary result)   Collection Time: 11/12/20 11:53 PM   Specimen: BLOOD  Result Value Ref Range Status   Specimen Description  BLOOD BLOOD RIGHT HAND  Final   Special Requests   Final    BOTTLES DRAWN AEROBIC AND ANAEROBIC Blood Culture results may not be optimal due to an inadequate volume of blood received in culture bottles   Culture   Final    NO GROWTH < 12 HOURS Performed at [facility], [address], [place], [place] [address]    Report Status PENDING  Incomplete         Radiology Studies: DG Chest 2 View  Result Date: 11/11/2020 CLINICAL DATA:  Short of breath for 3 weeks, left leg swelling, hypotensive EXAM: CHEST - 2 VIEW COMPARISON:  09/29/2018 FINDINGS: Frontal and lateral views of the chest demonstrate an unremarkable cardiac silhouette. Lungs are hyperinflated with background interstitial prominence consistent with emphysema. No acute airspace disease, effusion, or pneumothorax. No acute bony abnormalities. IMPRESSION: 1. Emphysema, no acute process. Electronically Signed   By: Randa Ngo M.D.   On: 11/11/2020 16:01   CT Angio Chest PE W/Cm &/Or Wo Cm  Result Date: 11/11/2020 CLINICAL DATA:  Shortness of breath EXAM: CT ANGIOGRAPHY CHEST WITH CONTRAST TECHNIQUE: Multidetector CT imaging of the chest was performed using the standard protocol during bolus administration of intravenous contrast. Multiplanar CT image reconstructions and MIPs were obtained to evaluate the vascular anatomy. CONTRAST:  143mL OMNIPAQUE IOHEXOL 350 MG/ML SOLN COMPARISON:  None. FINDINGS: Cardiovascular: No filling defects in the pulmonary arteries to suggest pulmonary emboli. Heart is normal size. Aorta is normal  caliber. Aortic and coronary artery calcifications. Mediastinum/Nodes: Mildly prominent/enlarged mediastinal lymph nodes. Left AP window lymph node has a short axis diameter of 14 mm. Findings similar to prior study. No axillary or hilar adenopathy. Lungs/Pleura: Trace bilateral pleural effusions. Bibasilar atelectasis. Emphysema. Upper Abdomen: Imaging into the upper abdomen demonstrates no acute findings. Musculoskeletal: Chest wall soft tissues are unremarkable. No acute bony abnormality. Review of the MIP images confirms the above findings. IMPRESSION: No evidence of pulmonary embolus. Trace bilateral pleural effusions, bibasilar atelectasis. Coronary artery disease. Aortic Atherosclerosis (ICD10-I70.0) and Emphysema (ICD10-J43.9). Electronically Signed   By: Rolm Baptise M.D.   On: 11/11/2020 16:55   MR FOOT LEFT WO CONTRAST  Result Date: 11/13/2020 CLINICAL DATA:  Pain and swelling. Diabetic with history of prior amputations. EXAM: MRI OF THE LEFT FOOT WITHOUT CONTRAST TECHNIQUE: Multiplanar, multisequence MR imaging of the left foot was performed. No intravenous contrast was administered. COMPARISON:  Radiographs 11/11/2020 FINDINGS: Prior amputations of the second and third toes are noted along with the fifth toe and down to the mid fifth metatarsal. Significant degenerative changes at the first MTP joint with marked hallux valgus deformity. Mild midfoot and hindfoot degenerative changes. Diffuse subcutaneous soft tissue swelling/edema/fluid suggesting cellulitis. This is most pronounced along the dorsum of the forefoot. I do not see a discrete open wound. No focal fluid collection to suggest a drainable soft tissue abscess. There is also mild diffuse myositis without definite findings for pyomyositis. No MR findings suspicious for septic arthritis or osteomyelitis. IMPRESSION: 1. Diffuse subcutaneous soft tissue swelling/edema/fluid suggesting cellulitis. No focal fluid collection to suggest a drainable  soft tissue abscess. 2. Mild diffuse myositis without definite findings for pyomyositis. 3. No MR findings suspicious for septic arthritis or osteomyelitis. 4. Prior amputations. Electronically Signed   By: Marijo Sanes M.D.   On: 11/13/2020 08:04   US Venous Img Lower Unilateral Left  Result Date: 11/11/2020 CLINICAL DATA:  Leg pain and swelling. EXAM: LEFT LOWER EXTREMITY VENOUS DOPPLER ULTRASOUND TECHNIQUE: Gray-scale sonography  with compression, as well as color and duplex ultrasound, were performed to evaluate the deep venous system(s) from the level of the common femoral vein through the popliteal and proximal calf veins. COMPARISON:  None. FINDINGS: VENOUS Normal compressibility of the common femoral, superficial femoral, and popliteal veins, as well as the visualized calf veins. Visualized portions of the great saphenous vein unremarkable. No filling defects to suggest DVT on grayscale or color Doppler imaging. Doppler waveforms show normal direction of venous flow, normal respiratory plasticity and response to augmentation. Profunda femoral vein not visualized. Peroneal veins not well visualized. Limited views of the contralateral common femoral vein are unremarkable. OTHER None. IMPRESSION: No DVT noted as discussed above. Electronically Signed   By: [place]   On: 11/11/2020 17:15   DG Foot 2 Views Left  Result Date: 11/11/2020 CLINICAL DATA:  Leg pain and swelling. EXAM: LEFT FOOT - 2 VIEW COMPARISON:  No recent. FINDINGS: Prominent diffuse soft tissue swelling. Soft tissue wound is noted adjacent to the left great toe and over the heel. No radiopaque foreign body. Peripheral vascular calcification. Prior amputations left second, third, and fifth digits. Prominent degenerative change first MTP joint. No acute bony abnormality identified. No evidence of fracture or dislocation. No bony erosions noted. If osteomyelitis is a clinical concern MRI can be obtained. IMPRESSION: 1. Prominent  diffuse soft tissue swelling. Soft tissue wounds noted adjacent to the left great toe and over the heel. No radiopaque foreign body. Peripheral vascular disease. 2. Prior amputations left second, third, and fifth digits. Prominent degenerative change first MTP joint. No acute bony abnormality identified. No bony erosions noted. If osteomyelitis is suspected MRI can be obtained. Electronically Signed   By: Marcello Moores  Register   On: 11/11/2020 16:33   ECHOCARDIOGRAM COMPLETE  Result Date: 11/12/2020    ECHOCARDIOGRAM REPORT   Patient Name:   ZIAH LEANDRO Date of Exam: 11/12/2020 Medical Rec #:  [id number]     Height:       70.0 in Accession #:    [id number]    Weight:       180.8 lb Date of Birth:  1951/05/10    BSA:          2.000 m Patient Age:    10 years      BP:           96/61 mmHg Patient Gender: M             HR:           83 bpm. Exam Location:  [facility] Procedure: 2D Echo, Color Doppler and Cardiac Doppler Indications:     R06.00 Dyspnea  History:         Patient has prior history of Echocardiogram examinations. PVD                  and COPD; Risk Factors:Hypertension, Dyslipidemia, Diabetes and                  Former Smoker.  Sonographer:     Charmayne Sheer RDCS (AE) Referring Phys:  [id number] AMY N [facility] Diagnosing Phys: Isaias Cowman MD  Sonographer Comments: Technically difficult study due to poor echo windows. Image acquisition challenging due to COPD. IMPRESSIONS  1. Left ventricular ejection fraction, by estimation, is 60 to 65%. The left ventricle has normal function. The left ventricle has no regional wall motion abnormalities. Left ventricular diastolic parameters were normal.  2. Right ventricular systolic function is normal. The right ventricular  size is normal.  3. The mitral valve is normal in structure. Trivial mitral valve regurgitation. No evidence of mitral stenosis.  4. The aortic valve is normal in structure. Aortic valve regurgitation is not visualized. No aortic stenosis is present.  5. The  inferior vena cava is normal in size with greater than 50% respiratory variability, suggesting right atrial pressure of 3 mmHg. FINDINGS  Left Ventricle: Left ventricular ejection fraction, by estimation, is 60 to 65%. The left ventricle has normal function. The left ventricle has no regional wall motion abnormalities. The left ventricular internal cavity size was normal in size. There is  no left ventricular hypertrophy. Left ventricular diastolic parameters were normal. Right Ventricle: The right ventricular size is normal. No increase in right ventricular wall thickness. Right ventricular systolic function is normal. Left Atrium: Left atrial size was normal in size. Right Atrium: Right atrial size was normal in size. Pericardium: There is no evidence of pericardial effusion. Mitral Valve: The mitral valve is normal in structure. Trivial mitral valve regurgitation. No evidence of mitral valve stenosis. MV peak gradient, 3.7 mmHg. The mean mitral valve gradient is 2.0 mmHg. Tricuspid Valve: The tricuspid valve is normal in structure. Tricuspid valve regurgitation is trivial. No evidence of tricuspid stenosis. Aortic Valve: The aortic valve is normal in structure. Aortic valve regurgitation is not visualized. No aortic stenosis is present. Aortic valve mean gradient measures 2.0 mmHg. Aortic valve peak gradient measures 3.9 mmHg. Aortic valve area, by VTI measures 1.92 cm. Pulmonic Valve: The pulmonic valve was normal in structure. Pulmonic valve regurgitation is not visualized. No evidence of pulmonic stenosis. Aorta: The aortic root is normal in size and structure. Venous: The inferior vena cava is normal in size with greater than 50% respiratory variability, suggesting right atrial pressure of 3 mmHg. IAS/Shunts: No atrial level shunt detected by color flow Doppler.  LEFT VENTRICLE PLAX 2D LVIDd:         4.90 cm  Diastology LVIDs:         3.30 cm  LV e' medial:    6.74 cm/s LV PW:         1.00 cm  LV E/e'  medial:  11.1 LV IVS:        1.00 cm  LV e' lateral:   8.16 cm/s LVOT diam:     1.90 cm  LV E/e' lateral: 9.2 LV SV:         33 LV SV Index:   17 LVOT Area:     2.84 cm  RIGHT VENTRICLE RV Basal diam:  2.90 cm LEFT ATRIUM         Index      RIGHT ATRIUM           Index LA diam:    3.40 cm 1.70 cm/m RA Area:     17.00 cm                                RA Volume:   45.30 ml  22.65 ml/m  AORTIC VALVE                   PULMONIC VALVE AV Area (Vmax):    2.14 cm    PV Vmax:       0.72 m/s AV Area (Vmean):   1.75 cm    PV Vmean:      44.700 cm/s AV Area (VTI):     1.92 cm  PV VTI:        0.124 m AV Vmax:           98.60 cm/s  PV Peak grad:  2.1 mmHg AV Vmean:          69.100 cm/s PV Mean grad:  1.0 mmHg AV VTI:            0.173 m AV Peak Grad:      3.9 mmHg AV Mean Grad:      2.0 mmHg LVOT Vmax:         74.40 cm/s LVOT Vmean:        42.700 cm/s LVOT VTI:          0.117 m LVOT/AV VTI ratio: 0.68  AORTA Ao Root diam: 3.40 cm MITRAL VALVE MV Area (PHT): 5.23 cm    SHUNTS MV Area VTI:   1.56 cm    Systemic VTI:  0.12 m MV Peak grad:  3.7 mmHg    Systemic Diam: 1.90 cm MV Mean grad:  2.0 mmHg MV Vmax:       0.96 m/s MV Vmean:      60.2 cm/s MV Decel Time: 145 msec MV E velocity: 75.00 cm/s MV A velocity: 77.60 cm/s MV E/A ratio:  0.97 Isaias Cowman MD Electronically signed by Isaias Cowman MD Signature Date/Time: 11/12/2020/1:30:08 PM    Final         Scheduled Meds: . aspirin EC  81 mg Oral Daily  . clopidogrel  75 mg Oral Daily  . collagenase   Topical Daily  . enoxaparin (LOVENOX) injection  40 mg Subcutaneous Q24H  . fluticasone  2 spray Each Nare Daily  . gabapentin  300 mg Oral TID  . insulin aspart  0-15 Units Subcutaneous TID WC  . insulin aspart  0-5 Units Subcutaneous QHS  . ipratropium-albuterol  3 mL Nebulization Q6H  . mometasone-formoterol  2 puff Inhalation BID  . oxybutynin  5 mg Oral BID  . pantoprazole  40 mg Oral Daily   Or  . pantoprazole (PROTONIX) IV  40 mg  Intravenous Daily  . pravastatin  10 mg Oral q1800   Continuous Infusions: . sodium chloride Stopped (11/12/20 0858)  . vancomycin Stopped (11/12/20 2309)     LOS: 2 days   Time spent= 35 mins    Jahree Dermody Arsenio Loader, MD [facility]  If 7PM-7AM, please contact night-coverage  11/13/2020, 9:01 AM

## 2020-11-13 NOTE — Progress Notes (Addendum)
Date of Admission:  11/11/2020     ID: [name] is a 70 y.o. male Principal Problem:   Sepsis ([place]) Active Problems:   PAD (peripheral artery disease) ([employer])   Diabetes ([place])   Mixed hyperlipidemia   Essential hypertension   GERD (gastroesophageal reflux disease)   Uncontrolled type 2 diabetes mellitus with hyperglycemia (H[facility])   Cardiomyopathy, nonischemic (H[facility])   Primary generalized (osteo)arthritis   Moderate asthma without complication   Diabetic foot ulcer ([facility])    Subjective: Feeling okay No shortness of breath No fever No pain abdomen   Medications:  . aspirin EC  81 mg Oral Daily  . clopidogrel  75 mg Oral Daily  . collagenase   Topical Daily  . enoxaparin (LOVENOX) injection  40 mg Subcutaneous Q24H  . fluticasone  2 spray Each Nare Daily  . gabapentin  300 mg Oral TID  . insulin aspart  0-15 Units Subcutaneous TID WC  . insulin aspart  0-5 Units Subcutaneous QHS  . ipratropium-albuterol  3 mL Nebulization Q6H  . mometasone-formoterol  2 puff Inhalation BID  . oxybutynin  5 mg Oral BID  . pantoprazole  40 mg Oral Daily   Or  . pantoprazole (PROTONIX) IV  40 mg Intravenous Daily  . pravastatin  10 mg Oral q1800    Objective: Vital signs in last 24 hours: Temp:  [97.7 F (36.5 C)-98.5 F (36.9 C)] 98.1 F (36.7 C) (02/17 1149) Pulse Rate:  [82-107] 82 (02/17 1149) Resp:  [18-20] 19 (02/17 1149) BP: (88-118)/(52-69) 118/62 (02/17 1149) SpO2:  [91 %-93 %] 93 % (02/17 1149)  PHYSICAL EXAM:  General: Alert, cooperative, no distress, appears stated age.  Head: Normocephalic, without obvious abnormality, atraumatic. Eyes: Conjunctivae clear, anicteric sclerae. Pupils are equal ENT Nares normal. No drainage or sinus tenderness. Lips, mucosa, and tongue normal. No Thrush Edentulous Neck: Supple, symmetrical, no adenopathy, thyroid: non tender no carotid bruit and no JVD. Back: No CVA tenderness. Lungs: Clear to auscultation bilaterally. No Wheezing  or Rhonchi. No rales. Heart: Regular rate and rhythm, no murmur, rub or gallop. Abdomen: Soft, distended. Bowel sounds normal. No masses Extremities:left foot- wound      Skin: No rashes or lesions. Or bruising Lymph: Cervical, supraclavicular normal. Neurologic: Grossly non-focal  Lab Results Recent Labs    11/12/20 0615 11/13/20 0636  WBC 5.9 5.2  HGB 10.2* 9.4*  HCT 30.7* 29.4*  NA 134* 132*  K 4.4 4.0  CL 100 97*  CO2 27 26  BUN 15 13  CREATININE 0.91 0.97   Liver Panel Recent Labs    11/11/20 1513  PROT 6.0*  ALBUMIN 2.7*  AST 23  ALT 22  ALKPHOS 56  BILITOT 1.2   Sedimentation Rate Recent Labs    11/12/20 0615  ESRSEDRATE 47*   C-Reactive Protein Recent Labs    11/12/20 0615  CRP 11.4*    Microbiology: 11/11/2020 Blood culture staph aureus and Enterococcus 4 out of 4 11/12/2020 blood culture has Enterococcus   Studies/Results: US Abdomen Complete  Result Date: 11/13/2020 CLINICAL DATA:  Ascites. EXAM: ABDOMEN ULTRASOUND COMPLETE COMPARISON:  None. FINDINGS: Gallbladder: Status post cholecystectomy. Common bile duct: Diameter: 3 mm which is within normal limits. Liver: No focal lesion identified. Within normal limits in parenchymal echogenicity. Portal vein is patent on color Doppler imaging with normal direction of blood flow towards the liver. IVC: No abnormality visualized. Pancreas: Visualized portion unremarkable. Spleen: Maximum measured diameter of 15.7 cm with calculated volume of 11  122 cubic cm consistent with moderate splenomegaly. No focal abnormality is noted. Right Kidney: Length: 10.7 cm. Echogenicity within normal limits. No mass or hydronephrosis visualized. Left Kidney: Length: 10.1 cm. Echogenicity within normal limits. No mass or hydronephrosis visualized. Abdominal aorta: No aneurysm visualized. Other findings: Possible minimal ascites is noted around the liver. IMPRESSION: Status post cholecystectomy. Possible minimal ascites. No  other abnormality seen in the abdomen. Electronically Signed   By: Marijo Conception M.D.   On: 11/13/2020 16:13   MR FOOT LEFT WO CONTRAST  Result Date: 11/13/2020 CLINICAL DATA:  Pain and swelling. Diabetic with history of prior amputations. EXAM: MRI OF THE LEFT FOOT WITHOUT CONTRAST TECHNIQUE: Multiplanar, multisequence MR imaging of the left foot was performed. No intravenous contrast was administered. COMPARISON:  Radiographs 11/11/2020 FINDINGS: Prior amputations of the second and third toes are noted along with the fifth toe and down to the mid fifth metatarsal. Significant degenerative changes at the first MTP joint with marked hallux valgus deformity. Mild midfoot and hindfoot degenerative changes. Diffuse subcutaneous soft tissue swelling/edema/fluid suggesting cellulitis. This is most pronounced along the dorsum of the forefoot. I do not see a discrete open wound. No focal fluid collection to suggest a drainable soft tissue abscess. There is also mild diffuse myositis without definite findings for pyomyositis. No MR findings suspicious for septic arthritis or osteomyelitis. IMPRESSION: 1. Diffuse subcutaneous soft tissue swelling/edema/fluid suggesting cellulitis. No focal fluid collection to suggest a drainable soft tissue abscess. 2. Mild diffuse myositis without definite findings for pyomyositis. 3. No MR findings suspicious for septic arthritis or osteomyelitis. 4. Prior amputations. Electronically Signed   By: Marijo Sanes M.D.   On: 11/13/2020 08:04   US Venous Img Lower Unilateral Left  Result Date: 11/11/2020 CLINICAL DATA:  Leg pain and swelling. EXAM: LEFT LOWER EXTREMITY VENOUS DOPPLER ULTRASOUND TECHNIQUE: Gray-scale sonography with compression, as well as color and duplex ultrasound, were performed to evaluate the deep venous system(s) from the level of the common femoral vein through the popliteal and proximal calf veins. COMPARISON:  None. FINDINGS: VENOUS Normal compressibility of  the common femoral, superficial femoral, and popliteal veins, as well as the visualized calf veins. Visualized portions of the great saphenous vein unremarkable. No filling defects to suggest DVT on grayscale or color Doppler imaging. Doppler waveforms show normal direction of venous flow, normal respiratory plasticity and response to augmentation. Profunda femoral vein not visualized. Peroneal veins not well visualized. Limited views of the contralateral common femoral vein are unremarkable. OTHER None. IMPRESSION: No DVT noted as discussed above. Electronically Signed   By: Marcello Moores  Register   On: 11/11/2020 17:15   ECHOCARDIOGRAM COMPLETE  Result Date: 11/12/2020    ECHOCARDIOGRAM REPORT   Patient Name:   [name] Date of Exam: 11/12/2020 Medical Rec #:  [id number]     Height:       70.0 in Accession #:    [id number]    Weight:       180.8 lb Date of Birth:  Nov 19, 1950    BSA:          2.000 m Patient Age:    11 years      BP:           96/61 mmHg Patient Gender: M             HR:           83 bpm. Exam Location:  [facility] Procedure: 2D Echo, Color Doppler and Cardiac Doppler Indications:  R06.00 Dyspnea  History:         Patient has prior history of Echocardiogram examinations. PVD                  and COPD; Risk Factors:Hypertension, Dyslipidemia, Diabetes and                  Former Smoker.  Sonographer:     Charmayne Sheer RDCS (AE) Referring Phys:  [id number] AMY N [facility] Diagnosing Phys: Isaias Cowman MD  Sonographer Comments: Technically difficult study due to poor echo windows. Image acquisition challenging due to COPD. IMPRESSIONS  1. Left ventricular ejection fraction, by estimation, is 60 to 65%. The left ventricle has normal function. The left ventricle has no regional wall motion abnormalities. Left ventricular diastolic parameters were normal.  2. Right ventricular systolic function is normal. The right ventricular size is normal.  3. The mitral valve is normal in structure. Trivial mitral valve  regurgitation. No evidence of mitral stenosis.  4. The aortic valve is normal in structure. Aortic valve regurgitation is not visualized. No aortic stenosis is present.  5. The inferior vena cava is normal in size with greater than 50% respiratory variability, suggesting right atrial pressure of 3 mmHg. FINDINGS  Left Ventricle: Left ventricular ejection fraction, by estimation, is 60 to 65%. The left ventricle has normal function. The left ventricle has no regional wall motion abnormalities. The left ventricular internal cavity size was normal in size. There is  no left ventricular hypertrophy. Left ventricular diastolic parameters were normal. Right Ventricle: The right ventricular size is normal. No increase in right ventricular wall thickness. Right ventricular systolic function is normal. Left Atrium: Left atrial size was normal in size. Right Atrium: Right atrial size was normal in size. Pericardium: There is no evidence of pericardial effusion. Mitral Valve: The mitral valve is normal in structure. Trivial mitral valve regurgitation. No evidence of mitral valve stenosis. MV peak gradient, 3.7 mmHg. The mean mitral valve gradient is 2.0 mmHg. Tricuspid Valve: The tricuspid valve is normal in structure. Tricuspid valve regurgitation is trivial. No evidence of tricuspid stenosis. Aortic Valve: The aortic valve is normal in structure. Aortic valve regurgitation is not visualized. No aortic stenosis is present. Aortic valve mean gradient measures 2.0 mmHg. Aortic valve peak gradient measures 3.9 mmHg. Aortic valve area, by VTI measures 1.92 cm. Pulmonic Valve: The pulmonic valve was normal in structure. Pulmonic valve regurgitation is not visualized. No evidence of pulmonic stenosis. Aorta: The aortic root is normal in size and structure. Venous: The inferior vena cava is normal in size with greater than 50% respiratory variability, suggesting right atrial pressure of 3 mmHg. IAS/Shunts: No atrial level shunt  detected by color flow Doppler.  LEFT VENTRICLE PLAX 2D LVIDd:         4.90 cm  Diastology LVIDs:         3.30 cm  LV e' medial:    6.74 cm/s LV PW:         1.00 cm  LV E/e' medial:  11.1 LV IVS:        1.00 cm  LV e' lateral:   8.16 cm/s LVOT diam:     1.90 cm  LV E/e' lateral: 9.2 LV SV:         33 LV SV Index:   17 LVOT Area:     2.84 cm  RIGHT VENTRICLE RV Basal diam:  2.90 cm LEFT ATRIUM         Index  RIGHT ATRIUM           Index LA diam:    3.40 cm 1.70 cm/m RA Area:     17.00 cm                                RA Volume:   45.30 ml  22.65 ml/m  AORTIC VALVE                   PULMONIC VALVE AV Area (Vmax):    2.14 cm    PV Vmax:       0.72 m/s AV Area (Vmean):   1.75 cm    PV Vmean:      44.700 cm/s AV Area (VTI):     1.92 cm    PV VTI:        0.124 m AV Vmax:           98.60 cm/s  PV Peak grad:  2.1 mmHg AV Vmean:          69.100 cm/s PV Mean grad:  1.0 mmHg AV VTI:            0.173 m AV Peak Grad:      3.9 mmHg AV Mean Grad:      2.0 mmHg LVOT Vmax:         74.40 cm/s LVOT Vmean:        42.700 cm/s LVOT VTI:          0.117 m LVOT/AV VTI ratio: 0.68  AORTA Ao Root diam: 3.40 cm MITRAL VALVE MV Area (PHT): 5.23 cm    SHUNTS MV Area VTI:   1.56 cm    Systemic VTI:  0.12 m MV Peak grad:  3.7 mmHg    Systemic Diam: 1.90 cm MV Mean grad:  2.0 mmHg MV Vmax:       0.96 m/s MV Vmean:      60.2 cm/s MV Decel Time: 145 msec MV E velocity: 75.00 cm/s MV A velocity: 77.60 cm/s MV E/A ratio:  0.97 Isaias Cowman MD Electronically signed by Isaias Cowman MD Signature Date/Time: 11/12/2020/1:30:08 PM    Final      Assessment/Plan: Enterococcus and staph aureus bacteremia.  In 4 out of 4 bottles. Currently on vancomycin We will switch to daptomycin Patient will undergo TEE tomorrow. We will repeat blood cultures tomorrow.  Left foot wounds with infection.  It is a combination of PAD/diabetes mellitus/peripheral neuropathy.  Cultures so far has got staph aureus.  MRI does not show any  osteomyelitis or septic arthritis.  Peripheral vascular disease  Diabetes mellitus management as per primary team  Anemia  Abdominal distention but ultrasound of the abdomen did not show any significant ascites.  There was moderate splenomegaly noted.  Discussed the management with the patient in detail.

## 2020-11-13 NOTE — Progress Notes (Addendum)
Pharmacy Antibiotic Note  [name] is a 70 y.o. male admitted on 11/11/2020 with bacteremia. Pt presented with L foot wounds, s/p debridement on 2/16. Pt has an allergy to penicillins with reaction of severe mouth ulcers occurring with both penicillin and ampicillin. Pt found to have 4/4 bottles positive for enterococcus faecalis bacteremia and staph aureus. Repeat blood cultures with gram positive cocci. Staph aureus also present in wound culture. Pt planned for LLE angiogram on Monday 2/21 and TEE. Pharmacy has been consulted for daptomycin dosing.  Day 2 abx  Plan: Start daptomycin 650 mg daily Baseline CK ordered - f/u level Monitor CK weekly  Follow-up TEE results  Height: 5\' 10"  (177.8 cm) Weight: 82 kg (180 lb 12.4 oz) IBW/kg (Calculated) : 73  Temp (24hrs), Avg:98.1 F (36.7 C), Min:97.7 F (36.5 C), Max:98.5 F (36.9 C)  Recent Labs  Lab 11/11/20 1513 11/11/20 1638 11/11/20 1845 11/12/20 0615 11/13/20 0636  WBC 8.2  --   --  5.9 5.2  CREATININE 1.05  --   --  0.91 0.97  LATICACIDVEN 1.1 2.1* 1.2  --   --     Estimated Creatinine Clearance: 76.3 mL/min (by C-G formula based on SCr of 0.97 mg/dL).    Allergies  Allergen Reactions  . Penicillins Rash and Other (See Comments)    Rash in and around the mouth Has patient had a PCN reaction causing immediate rash, facial/tongue/throat swelling, SOB or lightheadedness with hypotension: Yes Has patient had a PCN reaction causing severe rash involving mucus membranes or skin necrosis: Yes Has patient had a PCN reaction that required hospitalization: No Has patient had a PCN reaction occurring within the last 10 years: Yes If all of the above answers are "NO", then may proceed with Cephalosporin use.   . Bactrim [Sulfamethoxazole-Trimethoprim] Other (See Comments)    Mouth sores, Sores develop in mouth    Antimicrobials this admission: 2/15 cefepime >> 2/16 2/15 metronidazole x 1  2/15 vancomycin >>  2/17  Microbiology results: 2/15 BCx: e. Faecalis, staph aureus 2/16 BCx: Gram positive cocci 2/16 WCx: staph aureus  Thank you for allowing pharmacy to be a part of this patient's care.  Benn Moulder, PharmD Pharmacy Resident  11/13/2020 4:18 PM

## 2020-11-13 NOTE — Evaluation (Signed)
Occupational Therapy Evaluation Patient Details Name: [name] MRN: [id number] DOB: [birth year] Today's Date: 11/13/2020    History of Present Illness [name] is a 70 y.o. male with medical history significant for hypertension, nonischemic cardiomyopathy, non-insulin-dependent DM, peripheral neuropathy, and PAD, who presents to the ED following a visit with his PCP for SOB and swelling and pain in L LE. He endorses weakness and shortness of breath with exertion and increased sleeping and poor PO intake. He reports this has been ongoing for the last 2 weeks. He further endorses nausea and diarrhea x 2 weeks, with worsening the in last 2 days prompting evaluation at PCP. Pt reports unchanged baseline dry cough. He denies chest pain, abdominal pain, dysuria, hematuria. He endorses left lower extremity edema that has been ongoing for the last 2 weeks and worsening in the last 2 days. At PCP, he was advised to present to the ED due to his left lower extremity swelling and shortness of breath. Pt reports he was aware of the diabetic foot ulcer on the medial great toe, however he was unaware of the wound on the left heel.   Clinical Impression   [name] presents in good spirits, eager to engage in therapy. He endorses 8/10 pain with movement of LLE, but is relatively pain-free at rest. 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 He reports being IND in ADL and IADL and does not use any AD at present. Given bandage on foot and weight-bearing restrictions, [name] was unable today to demonstrate his ability to don/doff LB clothing and to transfer safely. Recommend continued OT while pt is in hospital, to determine if his latest foot infection will impact his ability to perform these tasks. At this point pt states he does not think he needs HHOT, but would like to re-evaluate this as prognosis for LLE becomes evident. Pt  has multiple assistive devices at home, but notably lacks a shower chair. Being able to sit while bathing could improve [name]'s safety at home. Despite having had DM for many years and having had several toes amputated, [name] says he does not regularly inspect his feet. Highly recommend he purchase a mirror for doing so and receive education as to how/when to use this simple device.    Follow Up Recommendations  Home health OT    Equipment Recommendations  Tub/shower seat    Recommendations for Other Services       Precautions / Restrictions Restrictions Weight Bearing Restrictions: Yes LLE Weight Bearing: Touchdown weight bearing      Mobility Bed Mobility Overal bed mobility: Modified Independent             General bed mobility comments: requires increased time and effort    Transfers                 General transfer comment: Unable to assess. Pt declined standing, 2/2 pain and weight bearing restrictions on LLE (pt is cleared for TTWB, but he prefers to remain NWT at present).    Balance Overall balance assessment: Modified Independent                                         ADL either performed or assessed with clinical judgement   ADL Overall ADL's : Independent  General ADL Comments: Pt reports driving, shopping, cooking, fishing. States he has no challenges with dressing, bathing, grooming, or functional mobility     Vision Baseline Vision/History: Wears glasses Wears Glasses: Reading only Patient Visual Report: No change from baseline       Perception     Praxis      Pertinent Vitals/Pain Pain Assessment: 0-10 Pain Score: 8  Pain Location: L LE. Pt reports 8/10 pain with movement, but little/no pain in static supine position Pain Intervention(s): Limited activity within patient's tolerance;Monitored during session     Hand Dominance     Extremity/Trunk  Assessment Upper Extremity Assessment Upper Extremity Assessment: Overall WFL for tasks assessed   Lower Extremity Assessment Lower Extremity Assessment: LLE deficits/detail;Overall WFL for tasks assessed LLE Deficits / Details: diabetic foot ulcer, medial to L great toe and on L heel       Communication Communication Communication: No difficulties   Cognition Arousal/Alertness: Awake/alert Behavior During Therapy: WFL for tasks assessed/performed Overall Cognitive Status: Within Functional Limits for tasks assessed                                 General Comments: Pt slightly impulsive, poor listener   General Comments  Swelling, BLE    Exercises Other Exercises Other Exercises: Bed mobility, grooming, UE therex, educ re: role of OT, DM mgmt, foot care   Shoulder Instructions      Home Living Family/patient expects to be discharged to:: Private residence Living Arrangements: Spouse/significant other Available Help at Discharge: Family;Friend(s);Available PRN/intermittently Type of Home: Mobile home Home Access: Ramped entrance     Home Layout: One level     Bathroom Shower/Tub: Teacher, early years/pre: Standard Bathroom Accessibility: No   Home Equipment: Environmental consultant - 2 wheels;Walker - 4 wheels;Bedside commode;Cane - single point;Cane - quad;Wheelchair - manual   Additional Comments: Pt does not use AD for ambulation at present, but has a range of equipment available at home.      Prior Functioning/Environment Level of Independence: Independent                 OT Problem List: Decreased range of motion;Decreased activity tolerance;Impaired balance (sitting and/or standing);Decreased safety awareness;Decreased knowledge of use of DME or AE;Decreased knowledge of precautions;Impaired sensation;Increased edema;Decreased strength      OT Treatment/Interventions: Self-care/ADL training;Therapeutic exercise;Energy conservation;DME and/or AE  instruction;Therapeutic activities;Balance training;Patient/family education    OT Goals(Current goals can be found in the care plan section) Acute Rehab OT Goals Patient Stated Goal: to get home OT Goal Formulation: With patient Time For Goal Achievement: 11/27/20 ADL Goals Pt Will Perform Lower Body Dressing: sit to/from stand;with adaptive equipment;with modified independence (with AE as needed) Pt Will Perform Tub/Shower Transfer: with modified independence;shower seat (using LRAD) Additional ADL Goal #1: Pt will identify/demonstrate 2+ methods of foot care  OT Frequency: Min 1X/week   Barriers to D/C: Decreased caregiver support  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, who requires 24/7 care       Co-evaluation              AM-PAC OT "6 Clicks" Daily Activity     Outcome Measure Help from another person eating meals?: None Help from another person taking care of personal grooming?: None Help from another person toileting, which includes using toliet, bedpan, or urinal?: A Little Help from another person bathing (including washing, rinsing, drying)?: A Little Help from  another person to put on and taking off regular upper body clothing?: None Help from another person to put on and taking off regular lower body clothing?: A Little 6 Click Score: 21   End of Session    Activity Tolerance: Patient tolerated treatment well Patient left: in bed;with call bell/phone within reach;with nursing/sitter in room;with family/visitor present  OT Visit Diagnosis: Other abnormalities of gait and mobility (R26.89);Muscle weakness (generalized) (M62.81)                Time: 6644-0347 OT Time Calculation (min): 23 min Charges:  OT General Charges $OT Visit: 1 Visit OT Evaluation $OT Eval Low Complexity: 1 Low OT Treatments $Self Care/Home Management : 23-37 mins  Josiah Lobo, PhD, MS, OTR/L ascom [phone] 11/13/20, 2:52 PM

## 2020-11-13 NOTE — Consult Note (Signed)
[name]  Patient ID: [name], MRN: [id number], DOB/AGE: [birth year] 70 y.o. Admit date: 11/11/2020   Date of Consult: 11/13/2020 Primary Physician: John Guise, MD Primary Cardiologist: [name]  Chief Complaint:  Chief Complaint  Patient presents with  . Shortness of Breath  . Leg Pain   Reason for Consult: Bacteremia  HPI: 70 y.o. male with known diabetes hypertension hyperlipidemia COPD and significant peripheral vascular disease with multiple complications requiring left great toe removal as well as ulcerations.  Patient is receiving appropriate medication management for these issues but now appears to have bacteremia.  This bacteremia is of concern that the patient may have need for further intervention including assessment of endocarditis.  Currently there is no evidence of significant chest pain shortness of breath or other congestive heart failure or acute coronary syndrome issues.  EKG has shown normal sinus rhythm with left-ventricular hypertrophy and nonspecific ST and T wave changes.  Chest x-ray shows no evidence of acute changes.  Therefore we have discussed at length further treatment options and need for transesophageal echocardiogram.  Patient and the family understand the risk and benefits of   transesophageal echocardiogram.  This includes the possibility of death stroke heart attack esophageal perforation and/or esophageal abnormalities.  He is at low risk for conscious sedation  Past Medical History:  Diagnosis Date  . Cholecystitis, chronic   . Cholelithiasis   . COPD (chronic obstructive pulmonary disease) ([place])   . Diabetes mellitus without complication ([place])   . Diabetic retinopathy ([place])   . Dyspnea   . Fatty liver 2008  . Fracture of clavicle 1992   left   . Fracture of ribs, multiple 1992   3 ribs  . GERD (gastroesophageal reflux disease)   . Hyperlipidemia   . Hypertension   . Paroxysmal SVT (supraventricular  tachycardia) ([place])   . Pelvis fracture ([place]) 1992  . Peripheral vascular disease ([place])   . Pleurisy   . Pleurisy   . Seasonal allergies       Surgical History:  Past Surgical History:  Procedure Laterality Date  . AMPUTATION TOE Left 02/25/2017   Procedure: AMPUTATION TOE-LEFT 2ND MPJ;  Surgeon: [name], DPM;  Location: [facility] ORS;  Service: Podiatry;  Laterality: Left;  . AMPUTATION TOE Left 10/20/2018   Procedure: TOE MPJ T2 LEFT;  Surgeon: [name], DPM;  Location: [facility] ORS;  Service: Podiatry;  Laterality: Left;  . AMPUTATION TOE Left 01/12/2019   Procedure: AMPUTATION LEFT 5TH TOE AND JOINT;  Surgeon: [name], DPM;  Location: [facility] ORS;  Service: Podiatry;  Laterality: Left;  . BACK SURGERY  2008  . CHOLECYSTECTOMY    . COLONOSCOPY WITH PROPOFOL N/A 08/15/2017   Procedure: COLONOSCOPY WITH PROPOFOL;  Surgeon: John Sails, MD;  Location: [facility] ENDOSCOPY;  Service: Endoscopy;  Laterality: N/A;  . ENDARTERECTOMY Right 05/12/2017   Procedure: ENDARTERECTOMY CAROTID;  Surgeon: John Huxley, MD;  Location: [facility] ORS;  Service: Vascular;  Laterality: Right;  . ENDARTERECTOMY FEMORAL Left 12/01/2018   Procedure: ENDARTERECTOMY FEMORAL;  Surgeon: John Huxley, MD;  Location: [facility] ORS;  Service: Vascular;  Laterality: Left;  . ERCP    . FOOT SURGERY Right    x 3  . LOWER EXTREMITY ANGIOGRAM Left 12/01/2018   Procedure: LOWER EXTREMITY ANGIOGRAM;  Surgeon: John Huxley, MD;  Location: [facility] ORS;  Service: Vascular;  Laterality: Left;  . LOWER EXTREMITY ANGIOGRAPHY Left 10/30/2018   Procedure: LOWER EXTREMITY ANGIOGRAPHY;  Surgeon: [name],  Erskine Squibb, MD;  Location: [facility];  Service: Cardiovascular;  Laterality: Left;  . LOWER EXTREMITY ANGIOGRAPHY Left 11/30/2018   Procedure: LOWER EXTREMITY ANGIOGRAPHY;  Surgeon: John Huxley, MD;  Location: [facility];  Service: Cardiovascular;  Laterality: Left;     Home Meds: Prior to Admission medications   Medication  Sig Start Date End Date Taking? Authorizing Provider  aspirin EC 81 MG tablet Take 81 mg by mouth daily.   Yes [provider]  Calcium Carbonate-Vitamin D (CALCIUM PLUS VITAMIN D PO) Take 1 tablet by mouth daily.   Yes [provider]  clopidogrel (PLAVIX) 75 MG tablet TAKE 1 TABLET BY MOUTH ONCE DAILY 08/06/20  Yes John Hartmann, NP  gabapentin (NEURONTIN) 300 MG capsule Take 1 capsule (300 mg total) by mouth 3 (three) times daily. 09/17/20  Yes John Freshwater, NP  glyBURIDE (DIABETA) 5 MG tablet Take 1 tablet (5 mg total) by mouth 2 (two) times daily with a meal. 08/18/20  Yes [name], John E, NP  lovastatin (MEVACOR) 10 MG tablet Take 1 tab  Po QHS 08/05/20  Yes [name], John E, NP  meloxicam (MOBIC) 15 MG tablet Take by mouth. 03/09/11  Yes [provider]  mometasone-formoterol (DULERA) 100-5 MCG/ACT AERO Inhale 2 puffs into the lungs 2 (two) times daily as needed for wheezing.   Yes [provider]  ondansetron (ZOFRAN-ODT) 4 MG disintegrating tablet Take 1 tablet (4 mg total) by mouth every 8 (eight) hours as needed for nausea or vomiting. 11/03/20  Yes John Ochoa, NP  oxybutynin (DITROPAN) 5 MG tablet Take 1 tablet (5 mg total) by mouth 2 (two) times daily. 11/10/20  Yes John Ochoa, NP  traMADol (ULTRAM) 50 MG tablet TAKE 1 TABLET BY MOUTH EVERY 12 HOURS ASNEEDED FOR PAIN 09/22/20  Yes John Freshwater, NP  glucose blood (ACCU-CHEK GUIDE) test strip Use as instructed 06/15/19   John Freshwater, NP  Lancets (ACCU-CHEK SOFT TOUCH) lancets Use as instructed 06/15/19   John Freshwater, NP  pneumococcal 23 valent vaccine (PNEUMOVAX 23) 25 MCG/0.5ML injection Inject 0.86ml IM once 06/20/20   John Freshwater, NP    Inpatient Medications:  . aspirin EC  81 mg Oral Daily  . clopidogrel  75 mg Oral Daily  . collagenase   Topical Daily  . enoxaparin (LOVENOX) injection  40 mg Subcutaneous Q24H  . fluticasone  2 spray Each Nare Daily  .  gabapentin  300 mg Oral TID  . insulin aspart  0-15 Units Subcutaneous TID WC  . insulin aspart  0-5 Units Subcutaneous QHS  . ipratropium-albuterol  3 mL Nebulization Q6H  . mometasone-formoterol  2 puff Inhalation BID  . oxybutynin  5 mg Oral BID  . pantoprazole  40 mg Oral Daily   Or  . pantoprazole (PROTONIX) IV  40 mg Intravenous Daily  . pravastatin  10 mg Oral q1800   . sodium chloride 1,000 mL (11/13/20 1058)  . vancomycin 1,000 mg (11/13/20 1059)    Allergies:  Allergies  Allergen Reactions  . Penicillins Rash and Other (See Comments)    Rash in and around the mouth Has patient had a PCN reaction causing immediate rash, facial/tongue/throat swelling, SOB or lightheadedness with hypotension: Yes Has patient had a PCN reaction causing severe rash involving mucus membranes or skin necrosis: Yes Has patient had a PCN reaction that required hospitalization: No Has patient had a PCN reaction occurring within the last 10  years: Yes If all of the above answers are "NO", then may proceed with Cephalosporin use.   . Bactrim [Sulfamethoxazole-Trimethoprim] Other (See Comments)    Mouth sores, Sores develop in mouth    Social History   Socioeconomic History  . Marital status: Single    Spouse name: Not on file  . Number of children: Not on file  . Years of education: Not on file  . Highest education level: Not on file  Occupational History  . Not on file  Tobacco Use  . Smoking status: Former Smoker    Quit date: 02/24/1997    Years since quitting: 23.7  . Smokeless tobacco: Never Used  Vaping Use  . Vaping Use: Never used  Substance and Sexual Activity  . Alcohol use: No  . Drug use: No  . Sexual activity: Not on file  Other Topics Concern  . Not on file  Social History Narrative  . Not on file   Social Determinants of Health   Financial Resource Strain: Not on file  Food Insecurity: Not on file  Transportation Needs: Not on file  Physical Activity: Not on  file  Stress: Not on file  Social Connections: Not on file  Intimate Partner Violence: Not on file     Family History  Problem Relation Age of Onset  . Diabetes Sister      Review of Systems Positive for weakness Negative for: General:  chills, fever, night sweats or weight changes.  Cardiovascular: PND orthopnea syncope dizziness  Dermatological skin lesions rashes Respiratory: Cough congestion Urologic: Frequent urination urination at night and hematuria Abdominal: negative for nausea, vomiting, diarrhea, bright red blood per rectum, melena, or hematemesis Neurologic: negative for visual changes, and/or hearing changes  All other systems reviewed and are otherwise negative except as noted above.  Labs: No results for input(s): CKTOTAL, CKMB, TROPONINI in the last 72 hours. Lab Results  Component Value Date   WBC 5.2 11/13/2020   HGB 9.4 (L) 11/13/2020   HCT 29.4 (L) 11/13/2020   MCV 88.3 11/13/2020   PLT 149 (L) 11/13/2020    Recent Labs  Lab 11/11/20 1513 11/12/20 0615 11/13/20 0636  NA 130*   < > 132*  K 4.5   < > 4.0  CL 94*   < > 97*  CO2 27   < > 26  BUN 20   < > 13  CREATININE 1.05   < > 0.97  CALCIUM 8.2*   < > 7.9*  PROT 6.0*  --   --   BILITOT 1.2  --   --   ALKPHOS 56  --   --   ALT 22  --   --   AST 23  --   --   GLUCOSE 308*   < > 148*   < > = values in this interval not displayed.   Lab Results  Component Value Date   CHOL 110 06/20/2020   HDL 28 (L) 06/20/2020   LDLCALC 50 06/20/2020   TRIG 159 (H) 06/20/2020   No results found for: DDIMER  Radiology/Studies:  DG Chest 2 View  Result Date: 11/11/2020 CLINICAL DATA:  Short of breath for 3 weeks, left leg swelling, hypotensive EXAM: CHEST - 2 VIEW COMPARISON:  09/29/2018 FINDINGS: Frontal and lateral views of the chest demonstrate an unremarkable cardiac silhouette. Lungs are hyperinflated with background interstitial prominence consistent with emphysema. No acute airspace disease,  effusion, or pneumothorax. No acute bony abnormalities. IMPRESSION: 1. Emphysema, no acute  process. Electronically Signed   By: Randa Ngo M.D.   On: 11/11/2020 16:01   DG Abd 1 View  Result Date: 11/04/2020 CLINICAL DATA:  abdmonial pain EXAM: ABDOMEN - 1 VIEW COMPARISON:  07/10/2015 and prior. FINDINGS: Gas is seen within nondilated bowel loops. No evidence of pneumoperitoneum. Post cholecystectomy sequela. Hyperdense foci overlying the bowel may reflect ingested material. Vascular calcifications and partially imaged left lower extremity stent. IMPRESSION: Nonobstructive bowel gas pattern. Electronically Signed   By: Primitivo Gauze M.D.   On: 11/04/2020 08:36   CT Angio Chest PE W/Cm &/Or Wo Cm  Result Date: 11/11/2020 CLINICAL DATA:  Shortness of breath EXAM: CT ANGIOGRAPHY CHEST WITH CONTRAST TECHNIQUE: Multidetector CT imaging of the chest was performed using the standard protocol during bolus administration of intravenous contrast. Multiplanar CT image reconstructions and MIPs were obtained to evaluate the vascular anatomy. CONTRAST:  137mL OMNIPAQUE IOHEXOL 350 MG/ML SOLN COMPARISON:  None. FINDINGS: Cardiovascular: No filling defects in the pulmonary arteries to suggest pulmonary emboli. Heart is normal size. Aorta is normal caliber. Aortic and coronary artery calcifications. Mediastinum/Nodes: Mildly prominent/enlarged mediastinal lymph nodes. Left AP window lymph node has a short axis diameter of 14 mm. Findings similar to prior study. No axillary or hilar adenopathy. Lungs/Pleura: Trace bilateral pleural effusions. Bibasilar atelectasis. Emphysema. Upper Abdomen: Imaging into the upper abdomen demonstrates no acute findings. Musculoskeletal: Chest wall soft tissues are unremarkable. No acute bony abnormality. Review of the MIP images confirms the above findings. IMPRESSION: No evidence of pulmonary embolus. Trace bilateral pleural effusions, bibasilar atelectasis. Coronary artery disease.  Aortic Atherosclerosis (ICD10-I70.0) and Emphysema (ICD10-J43.9). Electronically Signed   By: Rolm Baptise M.D.   On: 11/11/2020 16:55   MR FOOT LEFT WO CONTRAST  Result Date: 11/13/2020 CLINICAL DATA:  Pain and swelling. Diabetic with history of prior amputations. EXAM: MRI OF THE LEFT FOOT WITHOUT CONTRAST TECHNIQUE: Multiplanar, multisequence MR imaging of the left foot was performed. No intravenous contrast was administered. COMPARISON:  Radiographs 11/11/2020 FINDINGS: Prior amputations of the second and third toes are noted along with the fifth toe and down to the mid fifth metatarsal. Significant degenerative changes at the first MTP joint with marked hallux valgus deformity. Mild midfoot and hindfoot degenerative changes. Diffuse subcutaneous soft tissue swelling/edema/fluid suggesting cellulitis. This is most pronounced along the dorsum of the forefoot. I do not see a discrete open wound. No focal fluid collection to suggest a drainable soft tissue abscess. There is also mild diffuse myositis without definite findings for pyomyositis. No MR findings suspicious for septic arthritis or osteomyelitis. IMPRESSION: 1. Diffuse subcutaneous soft tissue swelling/edema/fluid suggesting cellulitis. No focal fluid collection to suggest a drainable soft tissue abscess. 2. Mild diffuse myositis without definite findings for pyomyositis. 3. No MR findings suspicious for septic arthritis or osteomyelitis. 4. Prior amputations. Electronically Signed   By: Marijo Sanes M.D.   On: 11/13/2020 08:04   US Venous Img Lower Unilateral Left  Result Date: 11/11/2020 CLINICAL DATA:  Leg pain and swelling. EXAM: LEFT LOWER EXTREMITY VENOUS DOPPLER ULTRASOUND TECHNIQUE: Gray-scale sonography with compression, as well as color and duplex ultrasound, were performed to evaluate the deep venous system(s) from the level of the common femoral vein through the popliteal and proximal calf veins. COMPARISON:  None. FINDINGS: VENOUS  Normal compressibility of the common femoral, superficial femoral, and popliteal veins, as well as the visualized calf veins. Visualized portions of the great saphenous vein unremarkable. No filling defects to suggest DVT on grayscale or color Doppler  imaging. Doppler waveforms show normal direction of venous flow, normal respiratory plasticity and response to augmentation. Profunda femoral vein not visualized. Peroneal veins not well visualized. Limited views of the contralateral common femoral vein are unremarkable. OTHER None. IMPRESSION: No DVT noted as discussed above. Electronically Signed   By: [place]ara [place]   On: 11/11/2020 17:15   DG Foot 2 Views Left  Result Date: 11/11/2020 CLINICAL DATA:  Leg pain and swelling. EXAM: LEFT FOOT - 2 VIEW COMPARISON:  No recent. FINDINGS: Prominent diffuse soft tissue swelling. Soft tissue wound is noted adjacent to the left great toe and over the heel. No radiopaque foreign body. Peripheral vascular calcification. Prior amputations left second, third, and fifth digits. Prominent degenerative change first MTP joint. No acute bony abnormality identified. No evidence of fracture or dislocation. No bony erosions noted. If osteomyelitis is a clinical concern MRI can be obtained. IMPRESSION: 1. Prominent diffuse soft tissue swelling. Soft tissue wounds noted adjacent to the left great toe and over the heel. No radiopaque foreign body. Peripheral vascular disease. 2. Prior amputations left second, third, and fifth digits. Prominent degenerative change first MTP joint. No acute bony abnormality identified. No bony erosions noted. If osteomyelitis is suspected MRI can be obtained. Electronically Signed   By: Marcello Moores  Register   On: 11/11/2020 16:33   ECHOCARDIOGRAM COMPLETE  Result Date: 11/12/2020    ECHOCARDIOGRAM REPORT   Patient Name:   [name] Date of Exam: 11/12/2020 Medical Rec #:  [id number]     Height:       70.0 in Accession #:    [id number]    Weight:        180.8 lb Date of Birth:  Sep 23, 1951    BSA:          2.000 m Patient Age:    15 years      BP:           96/61 mmHg Patient Gender: M             HR:           83 bpm. Exam Location:  [facility] Procedure: 2D Echo, Color Doppler and Cardiac Doppler Indications:     R06.00 Dyspnea  History:         Patient has prior history of Echocardiogram examinations. PVD                  and COPD; Risk Factors:Hypertension, Dyslipidemia, Diabetes and                  Former Smoker.  Sonographer:     Charmayne Sheer RDCS (AE) Referring Phys:  [id number] AMY N [facility] Diagnosing Phys: Isaias Cowman MD  Sonographer Comments: Technically difficult study due to poor echo windows. Image acquisition challenging due to COPD. IMPRESSIONS  1. Left ventricular ejection fraction, by estimation, is 60 to 65%. The left ventricle has normal function. The left ventricle has no regional wall motion abnormalities. Left ventricular diastolic parameters were normal.  2. Right ventricular systolic function is normal. The right ventricular size is normal.  3. The mitral valve is normal in structure. Trivial mitral valve regurgitation. No evidence of mitral stenosis.  4. The aortic valve is normal in structure. Aortic valve regurgitation is not visualized. No aortic stenosis is present.  5. The inferior vena cava is normal in size with greater than 50% respiratory variability, suggesting right atrial pressure of 3 mmHg. FINDINGS  Left Ventricle: Left ventricular ejection fraction, by estimation,  is 60 to 65%. The left ventricle has normal function. The left ventricle has no regional wall motion abnormalities. The left ventricular internal cavity size was normal in size. There is  no left ventricular hypertrophy. Left ventricular diastolic parameters were normal. Right Ventricle: The right ventricular size is normal. No increase in right ventricular wall thickness. Right ventricular systolic function is normal. Left Atrium: Left atrial size was normal in size.  Right Atrium: Right atrial size was normal in size. Pericardium: There is no evidence of pericardial effusion. Mitral Valve: The mitral valve is normal in structure. Trivial mitral valve regurgitation. No evidence of mitral valve stenosis. MV peak gradient, 3.7 mmHg. The mean mitral valve gradient is 2.0 mmHg. Tricuspid Valve: The tricuspid valve is normal in structure. Tricuspid valve regurgitation is trivial. No evidence of tricuspid stenosis. Aortic Valve: The aortic valve is normal in structure. Aortic valve regurgitation is not visualized. No aortic stenosis is present. Aortic valve mean gradient measures 2.0 mmHg. Aortic valve peak gradient measures 3.9 mmHg. Aortic valve area, by VTI measures 1.92 cm. Pulmonic Valve: The pulmonic valve was normal in structure. Pulmonic valve regurgitation is not visualized. No evidence of pulmonic stenosis. Aorta: The aortic root is normal in size and structure. Venous: The inferior vena cava is normal in size with greater than 50% respiratory variability, suggesting right atrial pressure of 3 mmHg. IAS/Shunts: No atrial level shunt detected by color flow Doppler.  LEFT VENTRICLE PLAX 2D LVIDd:         4.90 cm  Diastology LVIDs:         3.30 cm  LV e' medial:    6.74 cm/s LV PW:         1.00 cm  LV E/e' medial:  11.1 LV IVS:        1.00 cm  LV e' lateral:   8.16 cm/s LVOT diam:     1.90 cm  LV E/e' lateral: 9.2 LV SV:         33 LV SV Index:   17 LVOT Area:     2.84 cm  RIGHT VENTRICLE RV Basal diam:  2.90 cm LEFT ATRIUM         Index      RIGHT ATRIUM           Index LA diam:    3.40 cm 1.70 cm/m RA Area:     17.00 cm                                RA Volume:   45.30 ml  22.65 ml/m  AORTIC VALVE                   PULMONIC VALVE AV Area (Vmax):    2.14 cm    PV Vmax:       0.72 m/s AV Area (Vmean):   1.75 cm    PV Vmean:      44.700 cm/s AV Area (VTI):     1.92 cm    PV VTI:        0.124 m AV Vmax:           98.60 cm/s  PV Peak grad:  2.1 mmHg AV Vmean:           69.100 cm/s PV Mean grad:  1.0 mmHg AV VTI:            0.173 m AV Peak Grad:  3.9 mmHg AV Mean Grad:      2.0 mmHg LVOT Vmax:         74.40 cm/s LVOT Vmean:        42.700 cm/s LVOT VTI:          0.117 m LVOT/AV VTI ratio: 0.68  AORTA Ao Root diam: 3.40 cm MITRAL VALVE MV Area (PHT): 5.23 cm    SHUNTS MV Area VTI:   1.56 cm    Systemic VTI:  0.12 m MV Peak grad:  3.7 mmHg    Systemic Diam: 1.90 cm MV Mean grad:  2.0 mmHg MV Vmax:       0.96 m/s MV Vmean:      60.2 cm/s MV Decel Time: 145 msec MV E velocity: 75.00 cm/s MV A velocity: 77.60 cm/s MV E/A ratio:  0.97 Isaias Cowman MD Electronically signed by Isaias Cowman MD Signature Date/Time: 11/12/2020/1:30:08 PM    Final     EKG: Normal sinus rhythm left ventricular hypertrophy nonspecific ST and T wave changes  Weights: Filed Weights   11/11/20 1452  Weight: 82 kg     Physical Exam: Blood pressure 118/62, pulse 82, temperature 98.1 F (36.7 C), temperature source Oral, resp. rate 19, height 5\' 10"  (1.778 m), weight 82 kg, SpO2 93 %. Body mass index is 25.94 kg/m. General: Well developed, well nourished, in no acute distress. Head eyes ears nose throat: Normocephalic, atraumatic, sclera non-icteric, no xanthomas, nares are without discharge. No apparent thyromegaly and/or mass  Lungs: Normal respiratory effort.  no wheezes, no rales, no rhonchi.  Heart: RRR with normal S1 S2. no murmur gallop, no rub, PMI is normal size and placement, carotid upstroke normal without bruit, jugular venous pressure is normal Abdomen: Soft, non-tender, non-distended with normoactive bowel sounds. No hepatomegaly. No rebound/guarding. No obvious abdominal masses. Abdominal aorta is normal size without bruit Extremities: Trace edema.  Positive cyanosis, no clubbing, positive ulcers  Peripheral : 2+ bilateral upper extremity pulses, 2+ bilateral femoral pulses, 0+ bilateral dorsal pedal pulse Neuro: Alert and oriented. No facial asymmetry. No focal  deficit. Moves all extremities spontaneously. Musculoskeletal: Normal muscle tone without kyphosis Psych:  Responds to questions appropriately with a normal affect.    Assessment: 70 year old male with diabetes hypertension hyperlipidemia COPD and peripheral vascular disease with multiple complications now with concerns for bacteremia and endocarditis  Plan: 1.  Continue supportive care of peripheral vascular disease and other concerns as well as bacteremia with antibiotic treatment 2.  Proceed to transesophageal echocardiogram for further evaluation treatment options with the possibility of endocarditis  Signed, Corey Skains M.D. [facility] 11/13/2020, 1:46 PM

## 2020-11-14 ENCOUNTER — Inpatient Hospital Stay
Admit: 2020-11-14 | Discharge: 2020-11-14 | Disposition: A | Discharge status: Home / Self Care | Payer: Medicare HMO | Attending: Internal Medicine | Admitting: Internal Medicine

## 2020-11-14 ENCOUNTER — Encounter
Admission: EM | Disposition: A | Discharge status: Home Health | Payer: Self-pay | Source: Home / Self Care | Attending: Internal Medicine

## 2020-11-14 DIAGNOSIS — B9561 Methicillin susceptible Staphylococcus aureus infection as the cause of diseases classified elsewhere: Secondary | ICD-10-CM | POA: Diagnosis not present

## 2020-11-14 DIAGNOSIS — E11628 Type 2 diabetes mellitus with other skin complications: Secondary | ICD-10-CM | POA: Diagnosis not present

## 2020-11-14 DIAGNOSIS — A419 Sepsis, unspecified organism: Secondary | ICD-10-CM | POA: Diagnosis not present

## 2020-11-14 DIAGNOSIS — S91302A Unspecified open wound, left foot, initial encounter: Secondary | ICD-10-CM | POA: Diagnosis not present

## 2020-11-14 DIAGNOSIS — E1165 Type 2 diabetes mellitus with hyperglycemia: Secondary | ICD-10-CM | POA: Diagnosis not present

## 2020-11-14 DIAGNOSIS — I10 Essential (primary) hypertension: Secondary | ICD-10-CM | POA: Diagnosis not present

## 2020-11-14 DIAGNOSIS — I428 Other cardiomyopathies: Secondary | ICD-10-CM | POA: Diagnosis not present

## 2020-11-14 DIAGNOSIS — R7881 Bacteremia: Secondary | ICD-10-CM | POA: Diagnosis not present

## 2020-11-14 HISTORY — PX: TEE WITHOUT CARDIOVERSION: SHX5443

## 2020-11-14 LAB — CBC
HCT: 29.6 % — ABNORMAL LOW (ref 39.0–52.0)
Hemoglobin: 9.7 g/dL — ABNORMAL LOW (ref 13.0–17.0)
MCH: 28.7 pg (ref 26.0–34.0)
MCHC: 32.8 g/dL (ref 30.0–36.0)
MCV: 87.6 fL (ref 80.0–100.0)
Platelets: 166 10*3/uL (ref 150–400)
RBC: 3.38 MIL/uL — ABNORMAL LOW (ref 4.22–5.81)
RDW: 13.4 % (ref 11.5–15.5)
WBC: 4.6 10*3/uL (ref 4.0–10.5)
nRBC: 0 % (ref 0.0–0.2)

## 2020-11-14 LAB — CK: Total CK: 26 U/L — ABNORMAL LOW (ref 49–397)

## 2020-11-14 LAB — BASIC METABOLIC PANEL
Anion gap: 8 (ref 5–15)
BUN: 12 mg/dL (ref 8–23)
CO2: 25 mmol/L (ref 22–32)
Calcium: 7.9 mg/dL — ABNORMAL LOW (ref 8.9–10.3)
Chloride: 100 mmol/L (ref 98–111)
Creatinine, Ser: 0.94 mg/dL (ref 0.61–1.24)
GFR, Estimated: >60 mL/min (ref >60)
Glucose, Bld: 165 mg/dL — ABNORMAL HIGH (ref 70–99)
Potassium: 4 mmol/L (ref 3.5–5.1)
Sodium: 133 mmol/L — ABNORMAL LOW (ref 135–145)

## 2020-11-14 LAB — GLUCOSE, CAPILLARY
Glucose-Capillary: 166 mg/dL — ABNORMAL HIGH (ref 70–99)
Glucose-Capillary: 163 mg/dL — ABNORMAL HIGH (ref 70–99)
Glucose-Capillary: 129 mg/dL — ABNORMAL HIGH (ref 70–99)
Glucose-Capillary: 92 mg/dL (ref 70–99)

## 2020-11-14 LAB — HEMOGLOBIN A1C
Hgb A1c MFr Bld: 8.9 % — ABNORMAL HIGH (ref 4.8–5.6)
Mean Plasma Glucose: 209 mg/dL

## 2020-11-14 LAB — MAGNESIUM: Magnesium: 2.1 mg/dL (ref 1.7–2.4)

## 2020-11-14 SURGERY — ECHOCARDIOGRAM, TRANSESOPHAGEAL
Anesthesia: Moderate Sedation

## 2020-11-14 MED ORDER — BUTAMBEN-TETRACAINE-BENZOCAINE 2-2-14 % EX AERO
INHALATION_SPRAY | CUTANEOUS | Status: AC
  Filled 2020-11-14: qty 5

## 2020-11-14 MED ORDER — MIDAZOLAM HCL 5 MG/5ML IJ SOLN
INTRAMUSCULAR | Status: AC
  Filled 2020-11-14: qty 5

## 2020-11-14 MED ORDER — FENTANYL CITRATE (PF) 100 MCG/2ML IJ SOLN
INTRAMUSCULAR | Status: AC
  Filled 2020-11-14: qty 2

## 2020-11-14 MED ORDER — FENTANYL CITRATE (PF) 100 MCG/2ML IJ SOLN
INTRAMUSCULAR | Status: AC | PRN
  Administered 2020-11-14: 50 ug via INTRAVENOUS

## 2020-11-14 MED ORDER — MIDAZOLAM HCL 2 MG/2ML IJ SOLN
INTRAMUSCULAR | Status: AC | PRN
  Administered 2020-11-14 (×2): 2 mg via INTRAVENOUS

## 2020-11-14 MED ORDER — SODIUM CHLORIDE FLUSH 0.9 % IV SOLN
INTRAVENOUS | Status: AC
  Filled 2020-11-14: qty 10

## 2020-11-14 MED ORDER — LIDOCAINE VISCOUS HCL 2 % MT SOLN
OROMUCOSAL | Status: AC
  Filled 2020-11-14: qty 15

## 2020-11-14 NOTE — Progress Notes (Signed)
*  PRELIMINARY RESULTS* Echocardiogram Echocardiogram Transesophageal has been performed.  [name] 11/14/2020, 1:43 PM

## 2020-11-14 NOTE — Progress Notes (Signed)
Patient returned from TEE.   

## 2020-11-14 NOTE — Progress Notes (Signed)
ID Patient underwent TEE today Doing fine No specific complaints   Patient Vitals for the past 24 hrs:  BP Temp Temp src Pulse Resp SpO2  11/14/20 1942 137/83 97.8 F (36.6 C) Oral 98 20 97 %  11/14/20 1434 107/67 97.6 F (36.4 C) Oral 88 16 99 %  11/14/20 1417 103/61 -- -- 87 18 97 %  11/14/20 1400 95/61 -- -- 88 17 99 %  11/14/20 1345 (!) 97/55 -- -- 87 17 99 %  11/14/20 1330 (!) 91/56 -- -- 88 17 98 %  11/14/20 1327 (!) 82/47 -- -- 87 18 98 %  11/14/20 1324 (!) 87/48 -- -- 89 16 100 %  11/14/20 1323 -- -- -- -- -- 95 %  11/14/20 1320 (!) 80/50 -- -- 93 15 (!) 88 %  11/14/20 1315 115/69 -- -- 97 16 94 %  11/14/20 1243 106/65 -- -- 94 18 96 %  11/14/20 1226 112/65 98.1 F (36.7 C) Oral 96 18 94 %  11/14/20 1133 111/66 -- -- 100 -- 95 %  11/14/20 0956 93/65 98.1 F (36.7 C) Oral 100 18 94 %  11/14/20 0735 -- -- -- (!) 109 18 94 %   Chest bilateral air entry Heart sounds S1-S2 Abdomen soft CNS nonfocal Left foot dressing not removed Labs CBC Latest Ref Rng & Units 11/14/2020 11/13/2020 11/12/2020  WBC 4.0 - 10.5 K/uL 4.6 5.2 5.9  Hemoglobin 13.0 - 17.0 g/dL 9.7(L) 9.4(L) 10.2(L)  Hematocrit 39.0 - 52.0 % 29.6(L) 29.4(L) 30.7(L)  Platelets 150 - 400 K/uL 166 149(L) 165    CMP Latest Ref Rng & Units 11/14/2020 11/13/2020 11/12/2020  Glucose 70 - 99 mg/dL 165(H) 148(H) 111(H)  BUN 8 - 23 mg/dL 12 13 15   Creatinine 0.61 - 1.24 mg/dL 0.94 0.97 0.91  Sodium 135 - 145 mmol/L 133(L) 132(L) 134(L)  Potassium 3.5 - 5.1 mmol/L 4.0 4.0 4.4  Chloride 98 - 111 mmol/L 100 97(L) 100  CO2 22 - 32 mmol/L 25 26 27   Calcium 8.9 - 10.3 mg/dL 7.9(L) 7.9(L) 8.0(L)  Total Protein 6.5 - 8.1 g/dL - - -  Total Bilirubin 0.3 - 1.2 mg/dL - - -  Alkaline Phos 38 - 126 U/L - - -  AST 15 - 41 U/L - - -  ALT 0 - 44 U/L - - -    Micro 11/11/2020 blood culture Enterococcus and staph aureus  11/12/2020 blood culture Enterococcus   11/12/2020 wound culture staph aureus  11/14/2020 blood culture  pending.    Impression/recommendation Enterococcus bacteremia and staph aureus bacteremia Left heel wound has staph aureus TEE negative Repeat blood cultures have been sent. Patient is on daptomycin He is going to need at least 6 weeks of IV antibiotics If the 2/18 blood culture is negative in 48 hours then he can get a PICC line.  Left foot wounds with infection.  The combination of PAD/diabetes mellitus/peripheral neuropathy.  Peripheral vascular disease  Diabetes mellitus.  Management as primary team  Anemia  Discussed the management with the patient ID will follow him peripherally this weekend call if needed.

## 2020-11-14 NOTE — Progress Notes (Signed)
PROGRESS NOTE    [name]  IRC:[id number] DOB: January 12, 1951 DOA: 11/11/2020 PCP: [name], MD   Brief Narrative:  70 year old with history of essential hypertension, nonischemic cardiomyopathy, DM2, peripheral neuropathy, PAD, history of tobacco use quit 1998 presents with shortness of breath, poor oral intake nausea, diarrhea with left lower extremity swelling with worsening diabetic foot infection.  Patient has been fully vaccinated and boosted against COVID-19. Lower extremity Dopplers negative for DVT, CTA chest showed pleural effusion/atelectasis pleural effusion and atelectasis but no PE.  Blood cultures are positive for gram-positive cocci.  Surveillance cultures ordered.  Cardiology consulted for TEE.  Assessment & Plan:   Principal Problem:   Sepsis ([name]) Active Problems:   PAD (peripheral artery disease) ([place])   Diabetes ([name])   Mixed hyperlipidemia   Essential hypertension   GERD (gastroesophageal reflux disease)   Uncontrolled type 2 diabetes mellitus with hyperglycemia ([facility])   Cardiomyopathy, nonischemic ([place])   Primary generalized (osteo)arthritis   Moderate asthma without complication   Diabetic foot ulcer ([name])  Sepsis secondary to diabetic foot infection of his left lower extremity with ulceration and cellulitis nonpurulent MSSA/Enterococcus bacteremia Left lower extremity leg pain -Sepsis physiology appears to be improving. -MRI foot-showing cellulitis and myositis but no evidence of osteomyelitis -Antibiotics-daptomycin further sensitivity data is pending -Repeat surveillance culture still showing gram-positive bacteremia.  Repeat again in 48 to 72 hours to document clearance -Podiatry-dressing changes, culture sent.  No surgical indication at this time -ID following -Vascular-plans for angiogram on 2/18 with possible interventions -Lower extremity Dopplers-negative for DVT -Echocardiogram-EF 60 to 65% -Seen by cardiology, plans for TEE  today  Peripheral arterial disease - Aspirin Plavix and statin -Vascular surgery planning on angiogram on Monday  Chronic pain   -Pain control with bowel regimen  Shortness of breath with bilateral pleural effusion/atelectasis without hypoxia -Out of bed to chair.  Incentive spirometer.  As needed bronchodilators liters -Holding off on diuretics.  Discontinue IV fluids.  -Echocardiogram -EF 60 to 65%, no evidence of endocarditis  Diabetes mellitus type 2 -Peripheral neuropathy secondary to DM2 -Insulin sliding scale and Accu-Chek -Gabapentin 3 times daily  Hyperlipidemia Cont Statin   DVT prophylaxis: Lovenox Code Status: DNR Family Communication:    Status is: Inpatient  Remains inpatient appropriate because:Inpatient level of care appropriate due to severity of illness   Dispo: The patient is from: Home              Anticipated d/c is to: SNF              Anticipated d/c date is: > 3 days              Patient currently is not medically stable to d/c.   Difficult to place patient No    Body mass index is 25.94 kg/m.     Subjective: No complaints, waiting for his TEE today.    Review of Systems Otherwise negative except as per HPI, including: General = no fevers, chills, dizziness,  fatigue HEENT/EYES = negative for loss of vision, double vision, blurred vision,  sore throa Cardiovascular= negative for chest pain, palpitation Respiratory/lungs= negative for shortness of breath, cough, wheezing; hemoptysis,  Gastrointestinal= negative for nausea, vomiting, abdominal pain Genitourinary= negative for Dysuria MSK = Negative for arthralgia, myalgias Neurology= Negative for headache, numbness, tingling  Psychiatry= Negative for suicidal and homocidal ideation Skin= Negative for Rash   Examination: Constitutional: Not in acute distress Respiratory: Clear to auscultation bilaterally Cardiovascular: Normal sinus rhythm, no rubs  Abdomen: Nontender nondistended  good bowel sounds Musculoskeletal: No edema noted Skin: LLE dressing in place. Neurologic: CN 2-12 grossly intact.  And nonfocal Psychiatric: Normal judgment and insight. Alert and oriented x 3. Normal mood.     Objective: Vitals:   11/13/20 0817 11/13/20 1149 11/13/20 2009 11/14/20 0735  BP:  118/62 (!) 93/52   Pulse:  82 91 (!) 109  Resp:  19 18 18   Temp:  98.1 F (36.7 C) 97.8 F (36.6 C)   TempSrc:  Oral    SpO2: 92% 93% 95% 94%  Weight:      Height:        Intake/Output Summary (Last 24 hours) at 11/14/2020 0831 Last data filed at 11/14/2020 0500 Gross per 24 hour  Intake 1058.38 ml  Output 650 ml  Net 408.38 ml   Filed Weights   11/11/20 1452  Weight: 82 kg     Data Reviewed:   CBC: Recent Labs  Lab 11/11/20 1513 11/12/20 0615 11/13/20 0636 11/14/20 0014  WBC 8.2 5.9 5.2 4.6  HGB 10.4* 10.2* 9.4* 9.7*  HCT 31.6* 30.7* 29.4* 29.6*  MCV 87.3 87.0 88.3 87.6  PLT 191 165 149* 109   Basic Metabolic Panel: Recent Labs  Lab 11/11/20 1513 11/12/20 0615 11/13/20 0636 11/14/20 0014  NA 130* 134* 132* 133*  K 4.5 4.4 4.0 4.0  CL 94* 100 97* 100  CO2 27 27 26 25   GLUCOSE 308* 111* 148* 165*  BUN 20 15 13 12   CREATININE 1.05 0.91 0.97 0.94  CALCIUM 8.2* 8.0* 7.9* 7.9*  MG  --   --  2.0 2.1   GFR: Estimated Creatinine Clearance: 78.7 mL/min (by C-G formula based on SCr of 0.94 mg/dL). Liver Function Tests: Recent Labs  Lab 11/11/20 1513  AST 23  ALT 22  ALKPHOS 56  BILITOT 1.2  PROT 6.0*  ALBUMIN 2.7*   No results for input(s): LIPASE, AMYLASE in the last 168 hours. No results for input(s): AMMONIA in the last 168 hours. Coagulation Profile: Recent Labs  Lab 11/12/20 0615  INR 1.2   Cardiac Enzymes: Recent Labs  Lab 11/14/20 0014  CKTOTAL 26*   BNP (last 3 results) No results for input(s): PROBNP in the last 8760 hours. HbA1C: Recent Labs    11/13/20 0636  HGBA1C 8.9*   CBG: Recent Labs  Lab 11/12/20 2201 11/13/20 0716  11/13/20 1149 11/13/20 1644 11/13/20 2149  GLUCAP 116* 139* 183* 144* 163*   Lipid Profile: No results for input(s): CHOL, HDL, LDLCALC, TRIG, CHOLHDL, LDLDIRECT in the last 72 hours. Thyroid Function Tests: Recent Labs    11/12/20 0900  TSH 3.259   Anemia Panel: Recent Labs    11/13/20 0636  VITAMINB12 1,343*  FOLATE 16.9  FERRITIN 413*  RETICCTPCT 2.9   Sepsis Labs: Recent Labs  Lab 11/11/20 1513 11/11/20 1638 11/11/20 1845  PROCALCITON  --   --  0.34  LATICACIDVEN 1.1 2.1* 1.2    Recent Results (from the past 240 hour(s))  Blood culture (routine x 2)     Status: Abnormal (Preliminary result)   Collection Time: 11/11/20  3:13 PM   Specimen: BLOOD  Result Value Ref Range Status   Specimen Description   Final    BLOOD RIGHT ANTECUBITAL Performed at [facility], [address] [place], [place] [address]    Special Requests   Final    BOTTLES DRAWN AEROBIC AND ANAEROBIC Blood Culture adequate volume Performed at [facility], [address] [place], [place] [address]    Culture  Setup Time   Final    GRAM POSITIVE COCCI IN BOTH AEROBIC AND ANAEROBIC BOTTLES Organism ID to follow CRITICAL RESULT CALLED TO, READ BACK BY AND VERIFIED WITHVioleta Gelinas [facility] 0981 10/28/20 HNM Performed at [facility], [address], [place], [place] [address]    Culture (A)  Final    ENTEROCOCCUS FAECALIS STAPHYLOCOCCUS AUREUS SUSCEPTIBILITIES TO FOLLOW Performed at [facility], [place] [address], [place], [place] [address]    Report Status PENDING  Incomplete   Organism ID, Bacteria ENTEROCOCCUS FAECALIS  Final      Susceptibility   Enterococcus faecalis - MIC*    AMPICILLIN <=2 SENSITIVE Sensitive     VANCOMYCIN 1 SENSITIVE Sensitive     GENTAMICIN SYNERGY SENSITIVE Sensitive     * ENTEROCOCCUS FAECALIS  Blood Culture ID Panel (Reflexed)     Status: Abnormal   Collection Time: 11/11/20  3:13 PM  Result Value Ref Range Status    Enterococcus faecalis DETECTED (A) NOT DETECTED Final    Comment: CRITICAL RESULT CALLED TO, READ BACK BY AND VERIFIED WITH: Violeta Gelinas [facility] 2130 11/12/20 HNM    Enterococcus Faecium NOT DETECTED NOT DETECTED Final   Listeria monocytogenes NOT DETECTED NOT DETECTED Final   Staphylococcus species NOT DETECTED NOT DETECTED Final   Staphylococcus aureus (BCID) NOT DETECTED NOT DETECTED Final   Staphylococcus epidermidis NOT DETECTED NOT DETECTED Final   Staphylococcus lugdunensis NOT DETECTED NOT DETECTED Final   Streptococcus species NOT DETECTED NOT DETECTED Final   Streptococcus agalactiae NOT DETECTED NOT DETECTED Final   Streptococcus pneumoniae NOT DETECTED NOT DETECTED Final   Streptococcus pyogenes NOT DETECTED NOT DETECTED Final   A.calcoaceticus-baumannii NOT DETECTED NOT DETECTED Final   Bacteroides fragilis NOT DETECTED NOT DETECTED Final   Enterobacterales NOT DETECTED NOT DETECTED Final   Enterobacter cloacae complex NOT DETECTED NOT DETECTED Final   Escherichia coli NOT DETECTED NOT DETECTED Final   Klebsiella aerogenes NOT DETECTED NOT DETECTED Final   Klebsiella oxytoca NOT DETECTED NOT DETECTED Final   Klebsiella pneumoniae NOT DETECTED NOT DETECTED Final   Proteus species NOT DETECTED NOT DETECTED Final   Salmonella species NOT DETECTED NOT DETECTED Final   Serratia marcescens NOT DETECTED NOT DETECTED Final   Haemophilus influenzae NOT DETECTED NOT DETECTED Final   Neisseria meningitidis NOT DETECTED NOT DETECTED Final   Pseudomonas aeruginosa NOT DETECTED NOT DETECTED Final   Stenotrophomonas maltophilia NOT DETECTED NOT DETECTED Final   Candida albicans NOT DETECTED NOT DETECTED Final   Candida auris NOT DETECTED NOT DETECTED Final   Candida glabrata NOT DETECTED NOT DETECTED Final   Candida krusei NOT DETECTED NOT DETECTED Final   Candida parapsilosis NOT DETECTED NOT DETECTED Final   Candida tropicalis NOT DETECTED NOT DETECTED Final   Cryptococcus  neoformans/gattii NOT DETECTED NOT DETECTED Final   Vancomycin resistance NOT DETECTED NOT DETECTED Final    Comment: Performed at [facility], [address] [place], [place] [address]  Blood culture (routine x 2)     Status: Abnormal (Preliminary result)   Collection Time: 11/11/20  4:38 PM   Specimen: BLOOD  Result Value Ref Range Status   Specimen Description   Final    BLOOD LEFT ANTECUBITAL Performed at [facility], [address] [place], [place] [address]    Special Requests   Final    BOTTLES DRAWN AEROBIC AND ANAEROBIC Blood Culture results may not be optimal due to  an inadequate volume of blood received in culture bottles Performed at [facility], [address] [place], [place] [address]    Culture  Setup Time   Final    GRAM POSITIVE COCCI IN BOTH AEROBIC AND ANAEROBIC BOTTLES CRITICAL VALUE NOTED.  VALUE IS CONSISTENT WITH PREVIOUSLY REPORTED AND CALLED VALUE. Performed at [facility], [address] [place], [place] [address]    Culture ENTEROCOCCUS FAECALIS STAPHYLOCOCCUS AUREUS  (A)  Final   Report Status PENDING  Incomplete  Resp Panel by RT-PCR (Flu A&B, Covid) Nasopharyngeal Swab     Status: None   Collection Time: 11/11/20  6:45 PM   Specimen: Nasopharyngeal Swab; Nasopharyngeal(NP) swabs in vial transport medium  Result Value Ref Range Status   SARS Coronavirus 2 by RT PCR NEGATIVE NEGATIVE Final    Comment: (NOTE) SARS-CoV-2 target nucleic acids are NOT DETECTED.  The SARS-CoV-2 RNA is generally detectable in upper respiratory specimens during the acute phase of infection. The lowest concentration of SARS-CoV-2 viral copies this assay can detect is 138 copies/mL. A negative result does not preclude SARS-Cov-2 infection and should not be used as the sole basis for treatment or other patient management decisions. A negative result may occur with  improper specimen collection/handling, submission of specimen  other than nasopharyngeal swab, presence of viral mutation(s) within the areas targeted by this assay, and inadequate number of viral copies(<138 copies/mL). A negative result must be combined with clinical observations, patient history, and epidemiological information. The expected result is Negative.  Fact Sheet for Patients:  EntrepreneurPulse.com.au  Fact Sheet for Healthcare Providers:  IncredibleEmployment.be  This test is no t yet approved or cleared by the [place] FDA and  has been authorized for detection and/or diagnosis of SARS-CoV-2 by FDA under an Emergency Use Authorization (EUA). This EUA will remain  in effect (meaning this test can be used) for the duration of the COVID-19 declaration under Section 564(b)(1) of the Act, 21 U.S.C.section 360bbb-3(b)(1), unless the authorization is terminated  or revoked sooner.       Influenza A by PCR NEGATIVE NEGATIVE Final   Influenza B by PCR NEGATIVE NEGATIVE Final    Comment: (NOTE) The Xpert Xpress SARS-CoV-2/FLU/RSV plus assay is intended as an aid in the diagnosis of influenza from Nasopharyngeal swab specimens and should not be used as a sole basis for treatment. Nasal washings and aspirates are unacceptable for Xpert Xpress SARS-CoV-2/FLU/RSV testing.  Fact Sheet for Patients: EntrepreneurPulse.com.au  Fact Sheet for Healthcare Providers: IncredibleEmployment.be  This test is not yet approved or cleared by the [place] FDA and has been authorized for detection and/or diagnosis of SARS-CoV-2 by FDA under an Emergency Use Authorization (EUA). This EUA will remain in effect (meaning this test can be used) for the duration of the COVID-19 declaration under Section 564(b)(1) of the Act, 21 U.S.C. section 360bbb-3(b)(1), unless the authorization is terminated or revoked.  Performed at [facility], [address]  [place], [place] [address]   Aerobic/Anaerobic Culture w Gram Stain (surgical/deep wound)     Status: None (Preliminary result)   Collection Time: 11/12/20  1:01 PM   Specimen: Wound  Result Value Ref Range Status   Specimen Description   Final    WOUND Performed at [facility], [address] [place], [place] [address]    Special Requests   Final    LEFT FOOT Performed at [facility], [address], [place], [place] [address]    Gram Stain  NO WBC SEEN ABUNDANT GRAM POSITIVE COCCI   Final   Culture   Final    ABUNDANT STAPHYLOCOCCUS AUREUS SUSCEPTIBILITIES TO FOLLOW Performed at [facility], [place] [address], [place], [place] [address]    Report Status PENDING  Incomplete  Culture, blood (Routine X 2) w Reflex to ID Panel     Status: None (Preliminary result)   Collection Time: 11/12/20 11:53 PM   Specimen: BLOOD  Result Value Ref Range Status   Specimen Description   Final    BLOOD BLOOD LEFT HAND Performed at [facility], [address], [place], [place] [address]    Special Requests   Final    BOTTLES DRAWN AEROBIC AND ANAEROBIC Blood Culture adequate volume Performed at [facility], [address], [place], [place] [address]    Culture  Setup Time   Final    Organism ID to follow ANAEROBIC BOTTLE ONLY GRAM POSITIVE COCCI CRITICAL RESULT CALLED TO, READ BACK BY AND VERIFIED WITH: SUSAN WATSON 11/13/20 1629 KLW Performed at [facility], [address], [place], [place] [address]    Culture   Final    GRAM POSITIVE COCCI CULTURE REINCUBATED FOR BETTER GROWTH Performed at [facility], [place] [address], [place], [place] [address]    Report Status PENDING  Incomplete  Culture, blood (Routine X 2) w Reflex to ID Panel     Status: None (Preliminary result)   Collection Time: 11/12/20 11:53 PM   Specimen: BLOOD  Result Value Ref Range Status   Specimen Description BLOOD BLOOD RIGHT HAND  Final   Special  Requests   Final    BOTTLES DRAWN AEROBIC AND ANAEROBIC Blood Culture results may not be optimal due to an inadequate volume of blood received in culture bottles   Culture   Final    NO GROWTH 1 DAY Performed at [facility], [address] [place],  [address]    Report Status PENDING  Incomplete  Blood Culture ID Panel (Reflexed)     Status: Abnormal   Collection Time: 11/12/20 11:53 PM  Result Value Ref Range Status   Enterococcus faecalis DETECTED (A) NOT DETECTED Final    Comment: CRITICAL RESULT CALLED TO, READ BACK BY AND VERIFIED WITH: SUSAN WATSON 11/13/20 1629 KLW    Enterococcus Faecium NOT DETECTED NOT DETECTED Final   Listeria monocytogenes NOT DETECTED NOT DETECTED Final   Staphylococcus species NOT DETECTED NOT DETECTED Final   Staphylococcus aureus (BCID) NOT DETECTED NOT DETECTED Final   Staphylococcus epidermidis NOT DETECTED NOT DETECTED Final   Staphylococcus lugdunensis NOT DETECTED NOT DETECTED Final   Streptococcus species NOT DETECTED NOT DETECTED Final   Streptococcus agalactiae NOT DETECTED NOT DETECTED Final   Streptococcus pneumoniae NOT DETECTED NOT DETECTED Final   Streptococcus pyogenes NOT DETECTED NOT DETECTED Final   A.calcoaceticus-baumannii NOT DETECTED NOT DETECTED Final   Bacteroides fragilis NOT DETECTED NOT DETECTED Final   Enterobacterales NOT DETECTED NOT DETECTED Final   Enterobacter cloacae complex NOT DETECTED NOT DETECTED Final   Escherichia coli NOT DETECTED NOT DETECTED Final   Klebsiella aerogenes NOT DETECTED NOT DETECTED Final   Klebsiella oxytoca NOT DETECTED NOT DETECTED Final   Klebsiella pneumoniae NOT DETECTED NOT DETECTED Final   Proteus species NOT DETECTED NOT DETECTED Final   Salmonella species NOT DETECTED NOT DETECTED Final   Serratia marcescens NOT DETECTED NOT DETECTED Final   Haemophilus influenzae NOT DETECTED NOT DETECTED Final   Neisseria meningitidis NOT DETECTED  NOT DETECTED Final   Pseudomonas  aeruginosa NOT DETECTED NOT DETECTED Final   Stenotrophomonas maltophilia NOT DETECTED NOT DETECTED Final   Candida albicans NOT DETECTED NOT DETECTED Final   Candida auris NOT DETECTED NOT DETECTED Final   Candida glabrata NOT DETECTED NOT DETECTED Final   Candida krusei NOT DETECTED NOT DETECTED Final   Candida parapsilosis NOT DETECTED NOT DETECTED Final   Candida tropicalis NOT DETECTED NOT DETECTED Final   Cryptococcus neoformans/gattii NOT DETECTED NOT DETECTED Final   Vancomycin resistance NOT DETECTED NOT DETECTED Final    Comment: Performed at [facility], [address] [place], [place] [address]  CULTURE, BLOOD (ROUTINE X 2) w Reflex to ID Panel     Status: None (Preliminary result)   Collection Time: 11/14/20 12:04 AM   Specimen: BLOOD  Result Value Ref Range Status   Specimen Description BLOOD BLOOD RIGHT HAND  Final   Special Requests   Final    BOTTLES DRAWN AEROBIC AND ANAEROBIC Blood Culture adequate volume   Culture   Final    NO GROWTH < 12 HOURS Performed at [facility], [address], [place], [place] [address]    Report Status PENDING  Incomplete  CULTURE, BLOOD (ROUTINE X 2) w Reflex to ID Panel     Status: None (Preliminary result)   Collection Time: 11/14/20 12:14 AM   Specimen: BLOOD  Result Value Ref Range Status   Specimen Description BLOOD BLOOD LEFT HAND  Final   Special Requests   Final    BOTTLES DRAWN AEROBIC AND ANAEROBIC Blood Culture results may not be optimal due to an excessive volume of blood received in culture bottles   Culture   Final    NO GROWTH < 12 HOURS Performed at [facility], [address], [place], [place] [address]    Report Status PENDING  Incomplete         Radiology Studies: US Abdomen Complete  Result Date: 11/13/2020 CLINICAL DATA:  Ascites. EXAM: ABDOMEN ULTRASOUND COMPLETE COMPARISON:  None. FINDINGS: Gallbladder: Status post cholecystectomy. Common bile duct: Diameter: 3 mm  which is within normal limits. Liver: No focal lesion identified. Within normal limits in parenchymal echogenicity. Portal vein is patent on color Doppler imaging with normal direction of blood flow towards the liver. IVC: No abnormality visualized. Pancreas: Visualized portion unremarkable. Spleen: Maximum measured diameter of 15.7 cm with calculated volume of 11 122 cubic cm consistent with moderate splenomegaly. No focal abnormality is noted. Right Kidney: Length: 10.7 cm. Echogenicity within normal limits. No mass or hydronephrosis visualized. Left Kidney: Length: 10.1 cm. Echogenicity within normal limits. No mass or hydronephrosis visualized. Abdominal aorta: No aneurysm visualized. Other findings: Possible minimal ascites is noted around the liver. IMPRESSION: Status post cholecystectomy. Possible minimal ascites. No other abnormality seen in the abdomen. Electronically Signed   By: Marijo Conception M.D.   On: 11/13/2020 16:13   MR FOOT LEFT WO CONTRAST  Result Date: 11/13/2020 CLINICAL DATA:  Pain and swelling. Diabetic with history of prior amputations. EXAM: MRI OF THE LEFT FOOT WITHOUT CONTRAST TECHNIQUE: Multiplanar, multisequence MR imaging of the left foot was performed. No intravenous contrast was administered. COMPARISON:  Radiographs 11/11/2020 FINDINGS: Prior amputations of the second and third toes are noted along with the fifth toe and down to the mid fifth metatarsal. Significant degenerative changes at the first MTP joint with marked hallux valgus deformity. Mild midfoot and hindfoot degenerative changes. Diffuse subcutaneous soft tissue swelling/edema/fluid suggesting cellulitis. This  is most pronounced along the dorsum of the forefoot. I do not see a discrete open wound. No focal fluid collection to suggest a drainable soft tissue abscess. There is also mild diffuse myositis without definite findings for pyomyositis. No MR findings suspicious for septic arthritis or osteomyelitis.  IMPRESSION: 1. Diffuse subcutaneous soft tissue swelling/edema/fluid suggesting cellulitis. No focal fluid collection to suggest a drainable soft tissue abscess. 2. Mild diffuse myositis without definite findings for pyomyositis. 3. No MR findings suspicious for septic arthritis or osteomyelitis. 4. Prior amputations. Electronically Signed   By: Marijo Sanes M.D.   On: 11/13/2020 08:04   ECHOCARDIOGRAM COMPLETE  Result Date: 11/12/2020    ECHOCARDIOGRAM REPORT   Patient Name:   SAYEED WEATHERALL Date of Exam: 11/12/2020 Medical Rec #:  [id number]     Height:       70.0 in Accession #:    [id number]    Weight:       180.8 lb Date of Birth:  March 05, 1951    BSA:          2.000 m Patient Age:    5 years      BP:           96/61 mmHg Patient Gender: M             HR:           83 bpm. Exam Location:  [facility] Procedure: 2D Echo, Color Doppler and Cardiac Doppler Indications:     R06.00 Dyspnea  History:         Patient has prior history of Echocardiogram examinations. PVD                  and COPD; Risk Factors:Hypertension, Dyslipidemia, Diabetes and                  Former Smoker.  Sonographer:     Charmayne Sheer RDCS (AE) Referring Phys:  [id number] AMY N [facility] Diagnosing Phys: Isaias Cowman MD  Sonographer Comments: Technically difficult study due to poor echo windows. Image acquisition challenging due to COPD. IMPRESSIONS  1. Left ventricular ejection fraction, by estimation, is 60 to 65%. The left ventricle has normal function. The left ventricle has no regional wall motion abnormalities. Left ventricular diastolic parameters were normal.  2. Right ventricular systolic function is normal. The right ventricular size is normal.  3. The mitral valve is normal in structure. Trivial mitral valve regurgitation. No evidence of mitral stenosis.  4. The aortic valve is normal in structure. Aortic valve regurgitation is not visualized. No aortic stenosis is present.  5. The inferior vena cava is normal in size with greater than 50%  respiratory variability, suggesting right atrial pressure of 3 mmHg. FINDINGS  Left Ventricle: Left ventricular ejection fraction, by estimation, is 60 to 65%. The left ventricle has normal function. The left ventricle has no regional wall motion abnormalities. The left ventricular internal cavity size was normal in size. There is  no left ventricular hypertrophy. Left ventricular diastolic parameters were normal. Right Ventricle: The right ventricular size is normal. No increase in right ventricular wall thickness. Right ventricular systolic function is normal. Left Atrium: Left atrial size was normal in size. Right Atrium: Right atrial size was normal in size. Pericardium: There is no evidence of pericardial effusion. Mitral Valve: The mitral valve is normal in structure. Trivial mitral valve regurgitation. No evidence of mitral valve stenosis. MV peak gradient, 3.7 mmHg. The mean mitral valve gradient is 2.0 mmHg.  Tricuspid Valve: The tricuspid valve is normal in structure. Tricuspid valve regurgitation is trivial. No evidence of tricuspid stenosis. Aortic Valve: The aortic valve is normal in structure. Aortic valve regurgitation is not visualized. No aortic stenosis is present. Aortic valve mean gradient measures 2.0 mmHg. Aortic valve peak gradient measures 3.9 mmHg. Aortic valve area, by VTI measures 1.92 cm. Pulmonic Valve: The pulmonic valve was normal in structure. Pulmonic valve regurgitation is not visualized. No evidence of pulmonic stenosis. Aorta: The aortic root is normal in size and structure. Venous: The inferior vena cava is normal in size with greater than 50% respiratory variability, suggesting right atrial pressure of 3 mmHg. IAS/Shunts: No atrial level shunt detected by color flow Doppler.  LEFT VENTRICLE PLAX 2D LVIDd:         4.90 cm  Diastology LVIDs:         3.30 cm  LV e' medial:    6.74 cm/s LV PW:         1.00 cm  LV E/e' medial:  11.1 LV IVS:        1.00 cm  LV e' lateral:   8.16 cm/s  LVOT diam:     1.90 cm  LV E/e' lateral: 9.2 LV SV:         33 LV SV Index:   17 LVOT Area:     2.84 cm  RIGHT VENTRICLE RV Basal diam:  2.90 cm LEFT ATRIUM         Index      RIGHT ATRIUM           Index LA diam:    3.40 cm 1.70 cm/m RA Area:     17.00 cm                                RA Volume:   45.30 ml  22.65 ml/m  AORTIC VALVE                   PULMONIC VALVE AV Area (Vmax):    2.14 cm    PV Vmax:       0.72 m/s AV Area (Vmean):   1.75 cm    PV Vmean:      44.700 cm/s AV Area (VTI):     1.92 cm    PV VTI:        0.124 m AV Vmax:           98.60 cm/s  PV Peak grad:  2.1 mmHg AV Vmean:          69.100 cm/s PV Mean grad:  1.0 mmHg AV VTI:            0.173 m AV Peak Grad:      3.9 mmHg AV Mean Grad:      2.0 mmHg LVOT Vmax:         74.40 cm/s LVOT Vmean:        42.700 cm/s LVOT VTI:          0.117 m LVOT/AV VTI ratio: 0.68  AORTA Ao Root diam: 3.40 cm MITRAL VALVE MV Area (PHT): 5.23 cm    SHUNTS MV Area VTI:   1.56 cm    Systemic VTI:  0.12 m MV Peak grad:  3.7 mmHg    Systemic Diam: 1.90 cm MV Mean grad:  2.0 mmHg MV Vmax:       0.96 m/s MV Vmean:  60.2 cm/s MV Decel Time: 145 msec MV E velocity: 75.00 cm/s MV A velocity: 77.60 cm/s MV E/A ratio:  0.97 Isaias Cowman MD Electronically signed by Isaias Cowman MD Signature Date/Time: 11/12/2020/1:30:08 PM    Final         Scheduled Meds: . aspirin EC  81 mg Oral Daily  . clopidogrel  75 mg Oral Daily  . collagenase   Topical Daily  . enoxaparin (LOVENOX) injection  40 mg Subcutaneous Q24H  . fluticasone  2 spray Each Nare Daily  . gabapentin  300 mg Oral TID  . insulin aspart  0-15 Units Subcutaneous TID WC  . insulin aspart  0-5 Units Subcutaneous QHS  . ipratropium-albuterol  3 mL Nebulization Q6H  . mometasone-formoterol  2 puff Inhalation BID  . oxybutynin  5 mg Oral BID  . pantoprazole  40 mg Oral Daily   Or  . pantoprazole (PROTONIX) IV  40 mg Intravenous Daily  . pravastatin  10 mg Oral q1800   Continuous  Infusions: . sodium chloride Stopped (11/13/20 1206)  . sodium chloride 20 mL/hr at 11/14/20 0340  . DAPTOmycin (CUBICIN)  IV 226 mL/hr at 11/13/20 2338     LOS: 3 days   Time spent= 35 mins    Denissa Cozart Arsenio Loader, MD [facility]  If 7PM-7AM, please contact night-coverage  11/14/2020, 8:31 AM

## 2020-11-14 NOTE — Care Management Important Message (Signed)
Important Message  Patient Details  Name: [name] MRN: [id number] Date of Birth: Apr 07, 1951   Medicare Important Message Given:  Yes     Dannette Barbara 11/14/2020, 10:41 AM

## 2020-11-14 NOTE — CV Procedure (Signed)
Transesophageal echocardiogram preliminary report  MARKON JARES [id number] 1951-04-01  Preliminary diagnosis  Bacteremia with possible endocarditis  Postprocedural diagnosis Normal LV function without evidence of endocarditis or vegetation  Time out A timeout was performed by the nursing staff and physicians specifically identifying the procedure performed, identification of the patient, the type of sedation, all allergies and medications, all pertinent medical history, and presedation assessment of nasopharynx. The patient and or family understand the risks of the procedure including the rare risks of death, stroke, heart attack, esophogeal perforation, sore throat, and reaction to medications given.  Moderate sedation During this procedure the patient has received Versed 4 milligrams and fentanyl 50 micrograms to achieve appropriate moderate sedation.  The patient had continued monitoring of heart rate, oxygenation, blood pressure, respiratory rate, and extent of signs of sedation throughout the entire procedure.  The patient received this moderate sedation over a period of 12 minutes.  Both the nursing staff and I were present during the procedure when the patient had moderate sedation for 100% of the time.  Treatment considerations  No additional treatment considerations needed for bacteremia due to no current evidence of endocarditis  For further details of transesophageal echocardiogram please refer to final report.  Signed,  Corey Skains M.D. [facility] 11/14/2020 1:38 PM

## 2020-11-14 NOTE — Progress Notes (Signed)
Pharmacy Antibiotic Note  [name] is a 70 y.o. male admitted on 11/11/2020 with bacteremia. Pt presented with L foot wounds, s/p debridement on 2/16. Pt has an allergy to penicillins with reaction of severe mouth ulcers occurring with both penicillin and ampicillin. 2/15 4/4 bottles positive for enterococcus faecalis bacteremia; blood culture additionally growing staph aureus. Staph aureus also present in wound culture. Repeat blood cultures from 2/16 with gram positive cocci. No osteomyelitis or septic arthritis per MRI. Pt planned for LLE angiogram on Monday 2/21 and TEE on 2/18. Pharmacy has been consulted for daptomycin dosing.  Baseline CK 26  Day 3 abx  Plan: Continue daptomycin 650 mg daily Monitor CK weekly  F/u TEE results F/u repeat BCx  Height: 5\' 10"  (177.8 cm) Weight: 82 kg (180 lb 12.4 oz) IBW/kg (Calculated) : 73  Temp (24hrs), Avg:98 F (36.7 C), Min:97.8 F (36.6 C), Max:98.1 F (36.7 C)  Recent Labs  Lab 11/11/20 1513 11/11/20 1638 11/11/20 1845 11/12/20 0615 11/13/20 0636 11/14/20 0014  WBC 8.2  --   --  5.9 5.2 4.6  CREATININE 1.05  --   --  0.91 0.97 0.94  LATICACIDVEN 1.1 2.1* 1.2  --   --   --     Estimated Creatinine Clearance: 78.7 mL/min (by C-G formula based on SCr of 0.94 mg/dL).    Allergies  Allergen Reactions  . Penicillins Rash and Other (See Comments)    Rash in and around the mouth Has patient had a PCN reaction causing immediate rash, facial/tongue/throat swelling, SOB or lightheadedness with hypotension: Yes Has patient had a PCN reaction causing severe rash involving mucus membranes or skin necrosis: Yes Has patient had a PCN reaction that required hospitalization: No Has patient had a PCN reaction occurring within the last 10 years: Yes If all of the above answers are "NO", then may proceed with Cephalosporin use.   . Bactrim [Sulfamethoxazole-Trimethoprim] Other (See Comments)    Mouth sores, Sores develop in mouth     Antimicrobials this admission: 2/15 cefepime >> 2/16 2/15 metronidazole x 1  2/15 vancomycin >> 2/17 2/17 daptomycin >>  Microbiology results: 2/15 BCx: e. Faecalis, staph aureus 2/16 BCx: Gram positive cocci 2/16 WCx: staph aureus  Thank you for allowing pharmacy to be a part of this patient's care.  Benn Moulder, PharmD Pharmacy Resident  11/14/2020 8:18 AM

## 2020-11-14 NOTE — Progress Notes (Signed)
PT Cancellation Note  Patient Details Name: [name] MRN: [id number] DOB: Jul 05, 1951   Cancelled Treatment:    Reason Eval/Treat Not Completed: Other (comment) Pt not in room. Will attempt to see pt later for PT tx.   Lavone Nian, PT, DPT 11/14/20, 2:14 PM    [name] 11/14/2020, 2:13 PM

## 2020-11-14 NOTE — Progress Notes (Signed)
[facility] Encounter Note  Patient: CHLOE BAIG / Admit Date: 11/11/2020 / Date of Encounter: 11/14/2020, 4:51 PM   Subjective: Patient feeling much better at this time with improvements of overall status status post bacteremia.  The patient has had no evidence of anginal symptoms or congestive heart failure.  Transesophageal echocardiogram showing normal LV systolic function with ejection fraction of 50% with mild biatrial enlargement mild mitral and tricuspid regurgitation without evidence of vegetation and/or endocarditis  Review of Systems: Positive for: Shortness of breath Negative for: Vision change, hearing change, syncope, dizziness, nausea, vomiting,diarrhea, bloody stool, stomach pain, cough, congestion, diaphoresis, urinary frequency, urinary pain,skin lesions, skin rashes Others previously listed  Objective: Telemetry: Normal sinus rhythm Physical Exam: Blood pressure 107/67, pulse 88, temperature 97.6 F (36.4 C), temperature source Oral, resp. rate 16, height 5\' 10"  (1.778 m), weight 82 kg, SpO2 99 %. Body mass index is 25.94 kg/m. General: Well developed, well nourished, in no acute distress. Head: Normocephalic, atraumatic, sclera non-icteric, no xanthomas, nares are without discharge. Neck: No apparent masses Lungs: Normal respirations with no wheezes, no rhonchi, no rales , no crackles   Heart: Regular rate and rhythm, normal S1 S2, no murmur, no rub, no gallop, PMI is normal size and placement, carotid upstroke normal without bruit, jugular venous pressure normal Abdomen: Soft, non-tender, non-distended with normoactive bowel sounds. No hepatosplenomegaly. Abdominal aorta is normal size without bruit Extremities: Trace to 1+ edema, no clubbing, positive cyanosis, positive ulcers,  Peripheral: 2+ radial, 2+ femoral, 2+ dorsal pedal pulses Neuro: Alert and oriented. Moves all extremities spontaneously. Psych:  Responds to questions appropriately  with a normal affect.   Intake/Output Summary (Last 24 hours) at 11/14/2020 1651 Last data filed at 11/14/2020 0500 Gross per 24 hour  Intake 218.38 ml  Output 250 ml  Net -31.62 ml    Inpatient Medications:  . aspirin EC  81 mg Oral Daily  . butamben-tetracaine-benzocaine      . clopidogrel  75 mg Oral Daily  . collagenase   Topical Daily  . enoxaparin (LOVENOX) injection  40 mg Subcutaneous Q24H  . fentaNYL      . fluticasone  2 spray Each Nare Daily  . gabapentin  300 mg Oral TID  . insulin aspart  0-15 Units Subcutaneous TID WC  . insulin aspart  0-5 Units Subcutaneous QHS  . lidocaine      . midazolam      . mometasone-formoterol  2 puff Inhalation BID  . oxybutynin  5 mg Oral BID  . pantoprazole  40 mg Oral Daily   Or  . pantoprazole (PROTONIX) IV  40 mg Intravenous Daily  . pravastatin  10 mg Oral q1800  . sodium chloride flush       Infusions:  . sodium chloride Stopped (11/13/20 1206)  . DAPTOmycin (CUBICIN)  IV 226 mL/hr at 11/13/20 2338    Labs: Recent Labs    11/13/20 0636 11/14/20 0014  NA 132* 133*  K 4.0 4.0  CL 97* 100  CO2 26 25  GLUCOSE 148* 165*  BUN 13 12  CREATININE 0.97 0.94  CALCIUM 7.9* 7.9*  MG 2.0 2.1   No results for input(s): AST, ALT, ALKPHOS, BILITOT, PROT, ALBUMIN in the last 72 hours. Recent Labs    11/13/20 0636 11/14/20 0014  WBC 5.2 4.6  HGB 9.4* 9.7*  HCT 29.4* 29.6*  MCV 88.3 87.6  PLT 149* 166   Recent Labs    11/14/20 0014  CKTOTAL  26*   Invalid input(s): POCBNP Recent Labs    11/13/20 0636  HGBA1C 8.9*     Weights: Filed Weights   11/11/20 1452  Weight: 82 kg     Radiology/Studies:  DG Chest 2 View  Result Date: 11/11/2020 CLINICAL DATA:  Short of breath for 3 weeks, left leg swelling, hypotensive EXAM: CHEST - 2 VIEW COMPARISON:  09/29/2018 FINDINGS: Frontal and lateral views of the chest demonstrate an unremarkable cardiac silhouette. Lungs are hyperinflated with background interstitial  prominence consistent with emphysema. No acute airspace disease, effusion, or pneumothorax. No acute bony abnormalities. IMPRESSION: 1. Emphysema, no acute process. Electronically Signed   By: Randa Ngo M.D.   On: 11/11/2020 16:01   DG Abd 1 View  Result Date: 11/04/2020 CLINICAL DATA:  abdmonial pain EXAM: ABDOMEN - 1 VIEW COMPARISON:  07/10/2015 and prior. FINDINGS: Gas is seen within nondilated bowel loops. No evidence of pneumoperitoneum. Post cholecystectomy sequela. Hyperdense foci overlying the bowel may reflect ingested material. Vascular calcifications and partially imaged left lower extremity stent. IMPRESSION: Nonobstructive bowel gas pattern. Electronically Signed   By: Primitivo Gauze M.D.   On: 11/04/2020 08:36   CT Angio Chest PE W/Cm &/Or Wo Cm  Result Date: 11/11/2020 CLINICAL DATA:  Shortness of breath EXAM: CT ANGIOGRAPHY CHEST WITH CONTRAST TECHNIQUE: Multidetector CT imaging of the chest was performed using the standard protocol during bolus administration of intravenous contrast. Multiplanar CT image reconstructions and MIPs were obtained to evaluate the vascular anatomy. CONTRAST:  180mL OMNIPAQUE IOHEXOL 350 MG/ML SOLN COMPARISON:  None. FINDINGS: Cardiovascular: No filling defects in the pulmonary arteries to suggest pulmonary emboli. Heart is normal size. Aorta is normal caliber. Aortic and coronary artery calcifications. Mediastinum/Nodes: Mildly prominent/enlarged mediastinal lymph nodes. Left AP window lymph node has a short axis diameter of 14 mm. Findings similar to prior study. No axillary or hilar adenopathy. Lungs/Pleura: Trace bilateral pleural effusions. Bibasilar atelectasis. Emphysema. Upper Abdomen: Imaging into the upper abdomen demonstrates no acute findings. Musculoskeletal: Chest wall soft tissues are unremarkable. No acute bony abnormality. Review of the MIP images confirms the above findings. IMPRESSION: No evidence of pulmonary embolus. Trace bilateral  pleural effusions, bibasilar atelectasis. Coronary artery disease. Aortic Atherosclerosis (ICD10-I70.0) and Emphysema (ICD10-J43.9). Electronically Signed   By: Rolm Baptise M.D.   On: 11/11/2020 16:55   US Abdomen Complete  Result Date: 11/13/2020 CLINICAL DATA:  Ascites. EXAM: ABDOMEN ULTRASOUND COMPLETE COMPARISON:  None. FINDINGS: Gallbladder: Status post cholecystectomy. Common bile duct: Diameter: 3 mm which is within normal limits. Liver: No focal lesion identified. Within normal limits in parenchymal echogenicity. Portal vein is patent on color Doppler imaging with normal direction of blood flow towards the liver. IVC: No abnormality visualized. Pancreas: Visualized portion unremarkable. Spleen: Maximum measured diameter of 15.7 cm with calculated volume of 11 122 cubic cm consistent with moderate splenomegaly. No focal abnormality is noted. Right Kidney: Length: 10.7 cm. Echogenicity within normal limits. No mass or hydronephrosis visualized. Left Kidney: Length: 10.1 cm. Echogenicity within normal limits. No mass or hydronephrosis visualized. Abdominal aorta: No aneurysm visualized. Other findings: Possible minimal ascites is noted around the liver. IMPRESSION: Status post cholecystectomy. Possible minimal ascites. No other abnormality seen in the abdomen. Electronically Signed   By: Marijo Conception M.D.   On: 11/13/2020 16:13   MR FOOT LEFT WO CONTRAST  Result Date: 11/13/2020 CLINICAL DATA:  Pain and swelling. Diabetic with history of prior amputations. EXAM: MRI OF THE LEFT FOOT WITHOUT CONTRAST TECHNIQUE: Multiplanar, multisequence MR  imaging of the left foot was performed. No intravenous contrast was administered. COMPARISON:  Radiographs 11/11/2020 FINDINGS: Prior amputations of the second and third toes are noted along with the fifth toe and down to the mid fifth metatarsal. Significant degenerative changes at the first MTP joint with marked hallux valgus deformity. Mild midfoot and hindfoot  degenerative changes. Diffuse subcutaneous soft tissue swelling/edema/fluid suggesting cellulitis. This is most pronounced along the dorsum of the forefoot. I do not see a discrete open wound. No focal fluid collection to suggest a drainable soft tissue abscess. There is also mild diffuse myositis without definite findings for pyomyositis. No MR findings suspicious for septic arthritis or osteomyelitis. IMPRESSION: 1. Diffuse subcutaneous soft tissue swelling/edema/fluid suggesting cellulitis. No focal fluid collection to suggest a drainable soft tissue abscess. 2. Mild diffuse myositis without definite findings for pyomyositis. 3. No MR findings suspicious for septic arthritis or osteomyelitis. 4. Prior amputations. Electronically Signed   By: Marijo Sanes M.D.   On: 11/13/2020 08:04   US Venous Img Lower Unilateral Left  Result Date: 11/11/2020 CLINICAL DATA:  Leg pain and swelling. EXAM: LEFT LOWER EXTREMITY VENOUS DOPPLER ULTRASOUND TECHNIQUE: Gray-scale sonography with compression, as well as color and duplex ultrasound, were performed to evaluate the deep venous system(s) from the level of the common femoral vein through the popliteal and proximal calf veins. COMPARISON:  None. FINDINGS: VENOUS Normal compressibility of the common femoral, superficial femoral, and popliteal veins, as well as the visualized calf veins. Visualized portions of the great saphenous vein unremarkable. No filling defects to suggest DVT on grayscale or color Doppler imaging. Doppler waveforms show normal direction of venous flow, normal respiratory plasticity and response to augmentation. Profunda femoral vein not visualized. Peroneal veins not well visualized. Limited views of the contralateral common femoral vein are unremarkable. OTHER None. IMPRESSION: No DVT noted as discussed above. Electronically Signed   By: [place]   On: 11/11/2020 17:15   DG Foot 2 Views Left  Result Date: 11/11/2020 CLINICAL DATA:  Leg  pain and swelling. EXAM: LEFT FOOT - 2 VIEW COMPARISON:  No recent. FINDINGS: Prominent diffuse soft tissue swelling. Soft tissue wound is noted adjacent to the left great toe and over the heel. No radiopaque foreign body. Peripheral vascular calcification. Prior amputations left second, third, and fifth digits. Prominent degenerative change first MTP joint. No acute bony abnormality identified. No evidence of fracture or dislocation. No bony erosions noted. If osteomyelitis is a clinical concern MRI can be obtained. IMPRESSION: 1. Prominent diffuse soft tissue swelling. Soft tissue wounds noted adjacent to the left great toe and over the heel. No radiopaque foreign body. Peripheral vascular disease. 2. Prior amputations left second, third, and fifth digits. Prominent degenerative change first MTP joint. No acute bony abnormality identified. No bony erosions noted. If osteomyelitis is suspected MRI can be obtained. Electronically Signed   By: Marcello Moores  Register   On: 11/11/2020 16:33   ECHOCARDIOGRAM COMPLETE  Result Date: 11/12/2020    ECHOCARDIOGRAM REPORT   Patient Name:   DREVION OFFORD Date of Exam: 11/12/2020 Medical Rec #:  [id number]     Height:       70.0 in Accession #:    [id number]    Weight:       180.8 lb Date of Birth:  [birth year]    BSA:          2.000 m Patient Age:    70 years      BP:  96/61 mmHg Patient Gender: M             HR:           83 bpm. Exam Location:  [facility] Procedure: 2D Echo, Color Doppler and Cardiac Doppler Indications:     R06.00 Dyspnea  History:         Patient has prior history of Echocardiogram examinations. PVD                  and COPD; Risk Factors:Hypertension, Dyslipidemia, Diabetes and                  Former Smoker.  Sonographer:     Charmayne Sheer RDCS (AE) Referring Phys:  [id number] AMY N [facility] Diagnosing Phys: Isaias Cowman MD  Sonographer Comments: Technically difficult study due to poor echo windows. Image acquisition challenging due to COPD. IMPRESSIONS  1.  Left ventricular ejection fraction, by estimation, is 60 to 65%. The left ventricle has normal function. The left ventricle has no regional wall motion abnormalities. Left ventricular diastolic parameters were normal.  2. Right ventricular systolic function is normal. The right ventricular size is normal.  3. The mitral valve is normal in structure. Trivial mitral valve regurgitation. No evidence of mitral stenosis.  4. The aortic valve is normal in structure. Aortic valve regurgitation is not visualized. No aortic stenosis is present.  5. The inferior vena cava is normal in size with greater than 50% respiratory variability, suggesting right atrial pressure of 3 mmHg. FINDINGS  Left Ventricle: Left ventricular ejection fraction, by estimation, is 60 to 65%. The left ventricle has normal function. The left ventricle has no regional wall motion abnormalities. The left ventricular internal cavity size was normal in size. There is  no left ventricular hypertrophy. Left ventricular diastolic parameters were normal. Right Ventricle: The right ventricular size is normal. No increase in right ventricular wall thickness. Right ventricular systolic function is normal. Left Atrium: Left atrial size was normal in size. Right Atrium: Right atrial size was normal in size. Pericardium: There is no evidence of pericardial effusion. Mitral Valve: The mitral valve is normal in structure. Trivial mitral valve regurgitation. No evidence of mitral valve stenosis. MV peak gradient, 3.7 mmHg. The mean mitral valve gradient is 2.0 mmHg. Tricuspid Valve: The tricuspid valve is normal in structure. Tricuspid valve regurgitation is trivial. No evidence of tricuspid stenosis. Aortic Valve: The aortic valve is normal in structure. Aortic valve regurgitation is not visualized. No aortic stenosis is present. Aortic valve mean gradient measures 2.0 mmHg. Aortic valve peak gradient measures 3.9 mmHg. Aortic valve area, by VTI measures 1.92 cm.  Pulmonic Valve: The pulmonic valve was normal in structure. Pulmonic valve regurgitation is not visualized. No evidence of pulmonic stenosis. Aorta: The aortic root is normal in size and structure. Venous: The inferior vena cava is normal in size with greater than 50% respiratory variability, suggesting right atrial pressure of 3 mmHg. IAS/Shunts: No atrial level shunt detected by color flow Doppler.  LEFT VENTRICLE PLAX 2D LVIDd:         4.90 cm  Diastology LVIDs:         3.30 cm  LV e' medial:    6.74 cm/s LV PW:         1.00 cm  LV E/e' medial:  11.1 LV IVS:        1.00 cm  LV e' lateral:   8.16 cm/s LVOT diam:     1.90 cm  LV E/e'  lateral: 9.2 LV SV:         33 LV SV Index:   17 LVOT Area:     2.84 cm  RIGHT VENTRICLE RV Basal diam:  2.90 cm LEFT ATRIUM         Index      RIGHT ATRIUM           Index LA diam:    3.40 cm 1.70 cm/m RA Area:     17.00 cm                                RA Volume:   45.30 ml  22.65 ml/m  AORTIC VALVE                   PULMONIC VALVE AV Area (Vmax):    2.14 cm    PV Vmax:       0.72 m/s AV Area (Vmean):   1.75 cm    PV Vmean:      44.700 cm/s AV Area (VTI):     1.92 cm    PV VTI:        0.124 m AV Vmax:           98.60 cm/s  PV Peak grad:  2.1 mmHg AV Vmean:          69.100 cm/s PV Mean grad:  1.0 mmHg AV VTI:            0.173 m AV Peak Grad:      3.9 mmHg AV Mean Grad:      2.0 mmHg LVOT Vmax:         74.40 cm/s LVOT Vmean:        42.700 cm/s LVOT VTI:          0.117 m LVOT/AV VTI ratio: 0.68  AORTA Ao Root diam: 3.40 cm MITRAL VALVE MV Area (PHT): 5.23 cm    SHUNTS MV Area VTI:   1.56 cm    Systemic VTI:  0.12 m MV Peak grad:  3.7 mmHg    Systemic Diam: 1.90 cm MV Mean grad:  2.0 mmHg MV Vmax:       0.96 m/s MV Vmean:      60.2 cm/s MV Decel Time: 145 msec MV E velocity: 75.00 cm/s MV A velocity: 77.60 cm/s MV E/A ratio:  0.97 Isaias Cowman MD Electronically signed by Isaias Cowman MD Signature Date/Time: 11/12/2020/1:30:08 PM    Final      Assessment and  Recommendation  70 y.o. male with known diabetes hypertension hyperlipidemia COPD with peripheral vascular disease and bacteremia likely secondary to infection of his foot without evidence of endocarditis 1.  Continue supportive care peripheral vascular disease and infection and proceed to further surgical intervention and/or debridement of ulcers as needed.  Patient currently is at low risk from the cardiovascular standpoint 2.  Continue appropriate medication management for risk factor modification of hypertension hyperlipidemia and diabetes 3.  No further cardiac diagnostics necessary at this time due to no evidence of congestive heart failure cardiovascular disease or evidence of vegetation at this time 4.  Please call with further questions or other cardiovascular concerns  Signed, Serafina Royals M.D. FACC

## 2020-11-15 DIAGNOSIS — I10 Essential (primary) hypertension: Secondary | ICD-10-CM | POA: Diagnosis not present

## 2020-11-15 DIAGNOSIS — E1165 Type 2 diabetes mellitus with hyperglycemia: Secondary | ICD-10-CM | POA: Diagnosis not present

## 2020-11-15 DIAGNOSIS — I428 Other cardiomyopathies: Secondary | ICD-10-CM | POA: Diagnosis not present

## 2020-11-15 DIAGNOSIS — A419 Sepsis, unspecified organism: Secondary | ICD-10-CM | POA: Diagnosis not present

## 2020-11-15 LAB — BLOOD CULTURE ID PANEL (REFLEXED) - BCID2

## 2020-11-15 LAB — CULTURE, BLOOD (ROUTINE X 2): Special Requests: ADEQUATE

## 2020-11-15 LAB — CBC
HCT: 32.5 % — ABNORMAL LOW (ref 39.0–52.0)
Hemoglobin: 10.2 g/dL — ABNORMAL LOW (ref 13.0–17.0)
MCH: 27.8 pg (ref 26.0–34.0)
MCHC: 31.4 g/dL (ref 30.0–36.0)
MCV: 88.6 fL (ref 80.0–100.0)
Platelets: 176 10*3/uL (ref 150–400)
RBC: 3.67 MIL/uL — ABNORMAL LOW (ref 4.22–5.81)
RDW: 13.3 % (ref 11.5–15.5)
WBC: 4.9 10*3/uL (ref 4.0–10.5)
nRBC: 0 % (ref 0.0–0.2)

## 2020-11-15 LAB — BASIC METABOLIC PANEL
Anion gap: 6 (ref 5–15)
BUN: 7 mg/dL — ABNORMAL LOW (ref 8–23)
CO2: 29 mmol/L (ref 22–32)
Calcium: 8 mg/dL — ABNORMAL LOW (ref 8.9–10.3)
Chloride: 101 mmol/L (ref 98–111)
Creatinine, Ser: 0.85 mg/dL (ref 0.61–1.24)
GFR, Estimated: >60 mL/min (ref >60)
Glucose, Bld: 115 mg/dL — ABNORMAL HIGH (ref 70–99)
Potassium: 4.3 mmol/L (ref 3.5–5.1)
Sodium: 136 mmol/L (ref 135–145)

## 2020-11-15 LAB — GLUCOSE, CAPILLARY
Glucose-Capillary: 155 mg/dL — ABNORMAL HIGH (ref 70–99)
Glucose-Capillary: 130 mg/dL — ABNORMAL HIGH (ref 70–99)
Glucose-Capillary: 137 mg/dL — ABNORMAL HIGH (ref 70–99)
Glucose-Capillary: 169 mg/dL — ABNORMAL HIGH (ref 70–99)

## 2020-11-15 LAB — BRAIN NATRIURETIC PEPTIDE: B Natriuretic Peptide: 542.4 pg/mL — ABNORMAL HIGH (ref 0.0–100.0)

## 2020-11-15 LAB — MAGNESIUM: Magnesium: 2.1 mg/dL (ref 1.7–2.4)

## 2020-11-15 NOTE — Progress Notes (Signed)
PROGRESS NOTE    [name]  EZM:[id number] DOB: [birth year] DOA: 11/11/2020 PCP: [name], MD   Brief Narrative:  70 year old with history of essential hypertension, nonischemic cardiomyopathy, DM2, peripheral neuropathy, PAD, history of tobacco use quit 1998 presents with shortness of breath, poor oral intake nausea, diarrhea with left lower extremity swelling with worsening diabetic foot infection.  Patient has been fully vaccinated and boosted against COVID-19. Lower extremity Dopplers negative for DVT, CTA chest showed pleural effusion/atelectasis pleural effusion and atelectasis but no PE.  Blood cultures are positive for gram-positive cocci.  Surveillance cultures ordered.  Cardiology consulted for TEE which was negative.  Infectious disease recommended 6 weeks of IV daptomycin.  Plans to place PICC line once repeat cultures have remained clear of bacteremia.  Assessment & Plan:   Principal Problem:   Sepsis ([place]) Active Problems:   PAD (peripheral artery disease) ([place])   Diabetes ([place])   Mixed hyperlipidemia   Essential hypertension   GERD (gastroesophageal reflux disease)   Uncontrolled type 2 diabetes mellitus with hyperglycemia (H[facility])   Cardiomyopathy, nonischemic ([place])   Primary generalized (osteo)arthritis   Moderate asthma without complication   Diabetic foot ulcer ([place])  Sepsis secondary to diabetic foot infection of his left lower extremity with ulceration and cellulitis nonpurulent MSSA/Enterococcus bacteremia Left lower extremity leg pain -Sepsis physiology appears to be improving. -MRI foot-showing cellulitis and myositis but no evidence of osteomyelitis -Antibiotics-6 weeks of IV daptomycin per infectious disease. -If repeat cultures from 2/18 remains negative for 48 hours.  Patient will require PICC line. -Podiatry-dressing changes, culture sent.  No surgical indication at this time -ID following -Vascular-plans for angiogram on 2/18 with possible  interventions -Lower extremity Dopplers-negative for DVT -Echocardiogram-EF 60 to 65% -TEE 2/18-negative for endocarditis  Peripheral arterial disease - Aspirin Plavix and statin -Vascular surgery planning on angiogram on Monday  Chronic pain   -Pain control with bowel regimen  Shortness of breath with bilateral pleural effusion/atelectasis without hypoxia, improved -Out of bed to chair.  Incentive spirometer.  As needed bronchodilators liters -Echocardiogram -EF 60 to 65%, no evidence of endocarditis  Diabetes mellitus type 2, in acceptable range -Peripheral neuropathy secondary to DM2 -Insulin sliding scale and Accu-Chek -Gabapentin 3 times daily  Hyperlipidemia Cont Statin   DVT prophylaxis: Lovenox Code Status: DNR Family Communication:    Status is: Inpatient  Remains inpatient appropriate because:Inpatient level of care appropriate due to severity of illness   Dispo: The patient is from: Home              Anticipated d/c is to: SNF              Anticipated d/c date is: 2-3 days              Patient currently is not medically stable to d/c.  Awaiting bacteremia to clear up so PICC line can be placed for IV antibiotics upon discharge   Difficult to place patient No    Body mass index is 25.94 kg/m.     Subjective: No complaints overall feels well.  Remains afebrile overnight   Review of Systems Otherwise negative except as per HPI, including: General: Denies fever, chills, night sweats or unintended weight loss. Resp: Denies cough, wheezing, shortness of breath. Cardiac: Denies chest pain, palpitations, orthopnea, paroxysmal nocturnal dyspnea. GI: Denies abdominal pain, nausea, vomiting, diarrhea or constipation GU: Denies dysuria, frequency, hesitancy or incontinence MS: Denies muscle aches, joint pain or swelling Neuro: Denies headache, neurologic deficits (focal weakness,  numbness, tingling), abnormal gait Psych: Denies anxiety, depression,  SI/HI/AVH Skin: Denies new rashes or lesions ID: Denies sick contacts, exotic exposures, travel   Examination: Constitutional: Not in acute distress Respiratory: Clear to auscultation bilaterally Cardiovascular: Normal sinus rhythm, no rubs Abdomen: Nontender nondistended good bowel sounds Musculoskeletal: No edema noted Skin: Left lower extremity dressing in place Neurologic: CN 2-12 grossly intact.  And nonfocal Psychiatric: Normal judgment and insight. Alert and oriented x 3. Normal mood.  Objective: Vitals:   11/14/20 1942 11/14/20 2352 11/15/20 0442 11/15/20 0757  BP: 137/83 121/74 112/63 (!) 119/58  Pulse: 98 98 96 98  Resp: 20 20 20 12   Temp: 97.8 F (36.6 C) 98.2 F (36.8 C) 98.2 F (36.8 C) 98.2 F (36.8 C)  TempSrc: Oral Oral Oral Oral  SpO2: 97% 99% 92% 96%  Weight:      Height:        Intake/Output Summary (Last 24 hours) at 11/15/2020 1156 Last data filed at 11/15/2020 1154 Gross per 24 hour  Intake 120 ml  Output 1550 ml  Net -1430 ml   Filed Weights   11/11/20 1452  Weight: 82 kg     Data Reviewed:   CBC: Recent Labs  Lab 11/11/20 1513 11/12/20 0615 11/13/20 0636 11/14/20 0014 11/15/20 0417  WBC 8.2 5.9 5.2 4.6 4.9  HGB 10.4* 10.2* 9.4* 9.7* 10.2*  HCT 31.6* 30.7* 29.4* 29.6* 32.5*  MCV 87.3 87.0 88.3 87.6 88.6  PLT 191 165 149* 166 536   Basic Metabolic Panel: Recent Labs  Lab 11/11/20 1513 11/12/20 0615 11/13/20 0636 11/14/20 0014 11/15/20 0417  NA 130* 134* 132* 133* 136  K 4.5 4.4 4.0 4.0 4.3  CL 94* 100 97* 100 101  CO2 27 27 26 25 29   GLUCOSE 308* 111* 148* 165* 115*  BUN 20 15 13 12  7*  CREATININE 1.05 0.91 0.97 0.94 0.85  CALCIUM 8.2* 8.0* 7.9* 7.9* 8.0*  MG  --   --  2.0 2.1 2.1   GFR: Estimated Creatinine Clearance: 87.1 mL/min (by C-G formula based on SCr of 0.85 mg/dL). Liver Function Tests: Recent Labs  Lab 11/11/20 1513  AST 23  ALT 22  ALKPHOS 56  BILITOT 1.2  PROT 6.0*  ALBUMIN 2.7*   No results  for input(s): LIPASE, AMYLASE in the last 168 hours. No results for input(s): AMMONIA in the last 168 hours. Coagulation Profile: Recent Labs  Lab 11/12/20 0615  INR 1.2   Cardiac Enzymes: Recent Labs  Lab 11/14/20 0014  CKTOTAL 26*   BNP (last 3 results) No results for input(s): PROBNP in the last 8760 hours. HbA1C: Recent Labs    11/13/20 0636  HGBA1C 8.9*   CBG: Recent Labs  Lab 11/13/20 2149 11/14/20 0756 11/14/20 1133 11/14/20 1630 11/14/20 1944  GLUCAP 163* 166* 163* 129* 92   Lipid Profile: No results for input(s): CHOL, HDL, LDLCALC, TRIG, CHOLHDL, LDLDIRECT in the last 72 hours. Thyroid Function Tests: No results for input(s): TSH, T4TOTAL, FREET4, T3FREE, THYROIDAB in the last 72 hours. Anemia Panel: Recent Labs    11/13/20 0636  VITAMINB12 1,343*  FOLATE 16.9  FERRITIN 413*  RETICCTPCT 2.9   Sepsis Labs: Recent Labs  Lab 11/11/20 1513 11/11/20 1638 11/11/20 1845  PROCALCITON  --   --  0.34  LATICACIDVEN 1.1 2.1* 1.2    Recent Results (from the past 240 hour(s))  Blood culture (routine x 2)     Status: Abnormal   Collection Time: 11/11/20  3:13 PM  Specimen: BLOOD  Result Value Ref Range Status   Specimen Description   Final    BLOOD RIGHT ANTECUBITAL Performed at [facility], [address] [place], [place] [address]    Special Requests   Final    BOTTLES DRAWN AEROBIC AND ANAEROBIC Blood Culture adequate volume Performed at [facility], [address] [place], [place] [address]    Culture  Setup Time   Final    GRAM POSITIVE COCCI IN BOTH AEROBIC AND ANAEROBIC BOTTLES Organism ID to follow CRITICAL RESULT CALLED TO, READ BACK BY AND VERIFIED WITHVioleta Gelinas PHARMD [id number] 10/28/20 HNM Performed at [facility], [address] [place], [place] [address]    Culture ENTEROCOCCUS FAECALIS STAPHYLOCOCCUS AUREUS  (A)  Final   Report Status 11/15/2020 FINAL  Final   Organism ID, Bacteria  ENTEROCOCCUS FAECALIS  Final   Organism ID, Bacteria STAPHYLOCOCCUS AUREUS  Final      Susceptibility   Enterococcus faecalis - MIC*    AMPICILLIN <=2 SENSITIVE Sensitive     VANCOMYCIN 1 SENSITIVE Sensitive     GENTAMICIN SYNERGY SENSITIVE Sensitive     * ENTEROCOCCUS FAECALIS   Staphylococcus aureus - MIC*    CIPROFLOXACIN <=0.5 SENSITIVE Sensitive     ERYTHROMYCIN >=8 RESISTANT Resistant     GENTAMICIN <=0.5 SENSITIVE Sensitive     OXACILLIN <=0.25 SENSITIVE Sensitive     TETRACYCLINE <=1 SENSITIVE Sensitive     VANCOMYCIN <=0.5 SENSITIVE Sensitive     TRIMETH/SULFA <=10 SENSITIVE Sensitive     CLINDAMYCIN RESISTANT Resistant     RIFAMPIN <=0.5 SENSITIVE Sensitive     Inducible Clindamycin POSITIVE Resistant     * STAPHYLOCOCCUS AUREUS  Blood Culture ID Panel (Reflexed)     Status: Abnormal   Collection Time: 11/11/20  3:13 PM  Result Value Ref Range Status   Enterococcus faecalis DETECTED (A) NOT DETECTED Final    Comment: CRITICAL RESULT CALLED TO, READ BACK BY AND VERIFIED WITH: Violeta Gelinas PHAMRD [id number] 11/12/20 HNM    Enterococcus Faecium NOT DETECTED NOT DETECTED Final   Listeria monocytogenes NOT DETECTED NOT DETECTED Final   Staphylococcus species NOT DETECTED NOT DETECTED Final   Staphylococcus aureus (BCID) NOT DETECTED NOT DETECTED Final   Staphylococcus epidermidis NOT DETECTED NOT DETECTED Final   Staphylococcus lugdunensis NOT DETECTED NOT DETECTED Final   Streptococcus species NOT DETECTED NOT DETECTED Final   Streptococcus agalactiae NOT DETECTED NOT DETECTED Final   Streptococcus pneumoniae NOT DETECTED NOT DETECTED Final   Streptococcus pyogenes NOT DETECTED NOT DETECTED Final   A.calcoaceticus-baumannii NOT DETECTED NOT DETECTED Final   Bacteroides fragilis NOT DETECTED NOT DETECTED Final   Enterobacterales NOT DETECTED NOT DETECTED Final   Enterobacter cloacae complex NOT DETECTED NOT DETECTED Final   Escherichia coli NOT DETECTED NOT DETECTED Final    Klebsiella aerogenes NOT DETECTED NOT DETECTED Final   Klebsiella oxytoca NOT DETECTED NOT DETECTED Final   Klebsiella pneumoniae NOT DETECTED NOT DETECTED Final   Proteus species NOT DETECTED NOT DETECTED Final   Salmonella species NOT DETECTED NOT DETECTED Final   Serratia marcescens NOT DETECTED NOT DETECTED Final   Haemophilus influenzae NOT DETECTED NOT DETECTED Final   Neisseria meningitidis NOT DETECTED NOT DETECTED Final   Pseudomonas aeruginosa NOT DETECTED NOT DETECTED Final   Stenotrophomonas maltophilia NOT DETECTED NOT DETECTED Final   Candida albicans NOT DETECTED NOT DETECTED Final   Candida auris NOT DETECTED NOT DETECTED Final   Candida glabrata NOT DETECTED NOT DETECTED Final  Candida krusei NOT DETECTED NOT DETECTED Final   Candida parapsilosis NOT DETECTED NOT DETECTED Final   Candida tropicalis NOT DETECTED NOT DETECTED Final   Cryptococcus neoformans/gattii NOT DETECTED NOT DETECTED Final   Vancomycin resistance NOT DETECTED NOT DETECTED Final    Comment: Performed at [facility], [address] [place], [place] [address]  Blood culture (routine x 2)     Status: Abnormal   Collection Time: 11/11/20  4:38 PM   Specimen: BLOOD  Result Value Ref Range Status   Specimen Description   Final    BLOOD LEFT ANTECUBITAL Performed at [facility], [address] [place], [place] [address]    Special Requests   Final    BOTTLES DRAWN AEROBIC AND ANAEROBIC Blood Culture results may not be optimal due to an inadequate volume of blood received in culture bottles Performed at [facility], [address] [place], [place] [address]    Culture  Setup Time   Final    GRAM POSITIVE COCCI IN BOTH AEROBIC AND ANAEROBIC BOTTLES CRITICAL VALUE NOTED.  VALUE IS CONSISTENT WITH PREVIOUSLY REPORTED AND CALLED VALUE. Performed at [facility], [address] [place], [place] [address]    Culture (A)  Final    ENTEROCOCCUS  FAECALIS STAPHYLOCOCCUS AUREUS SUSCEPTIBILITIES PERFORMED ON PREVIOUS CULTURE WITHIN THE LAST 5 DAYS. Performed at [facility], [place] [address] [place],  [address]    Report Status 11/15/2020 FINAL  Final  Resp Panel by RT-PCR (Flu A&B, Covid) Nasopharyngeal Swab     Status: None   Collection Time: 11/11/20  6:45 PM   Specimen: Nasopharyngeal Swab; Nasopharyngeal(NP) swabs in vial transport medium  Result Value Ref Range Status   SARS Coronavirus 2 by RT PCR NEGATIVE NEGATIVE Final    Comment: (NOTE) SARS-CoV-2 target nucleic acids are NOT DETECTED.  The SARS-CoV-2 RNA is generally detectable in upper respiratory specimens during the acute phase of infection. The lowest concentration of SARS-CoV-2 viral copies this assay can detect is 138 copies/mL. A negative result does not preclude SARS-Cov-2 infection and should not be used as the sole basis for treatment or other patient management decisions. A negative result may occur with  improper specimen collection/handling, submission of specimen other than nasopharyngeal swab, presence of viral mutation(s) within the areas targeted by this assay, and inadequate number of viral copies(<138 copies/mL). A negative result must be combined with clinical observations, patient history, and epidemiological information. The expected result is Negative.  Fact Sheet for Patients:  EntrepreneurPulse.com.au  Fact Sheet for Healthcare Providers:  IncredibleEmployment.be  This test is no t yet approved or cleared by the [place] FDA and  has been authorized for detection and/or diagnosis of SARS-CoV-2 by FDA under an Emergency Use Authorization (EUA). This EUA will remain  in effect (meaning this test can be used) for the duration of the COVID-19 declaration under Section 564(b)(1) of the Act, 21 U.S.C.section 360bbb-3(b)(1), unless the authorization is terminated  or revoked sooner.        Influenza A by PCR NEGATIVE NEGATIVE Final   Influenza B by PCR NEGATIVE NEGATIVE Final    Comment: (NOTE) The Xpert Xpress SARS-CoV-2/FLU/RSV plus assay is intended as an aid in the diagnosis of influenza from Nasopharyngeal swab specimens and should not be used as a sole basis for treatment. Nasal washings and aspirates are unacceptable for Xpert Xpress SARS-CoV-2/FLU/RSV testing.  Fact Sheet for Patients: EntrepreneurPulse.com.au  Fact Sheet for Healthcare Providers: IncredibleEmployment.be  This test  is not yet approved or cleared by the [place] and has been authorized for detection and/or diagnosis of SARS-CoV-2 by FDA under an Emergency Use Authorization (EUA). This EUA will remain in effect (meaning this test can be used) for the duration of the COVID-19 declaration under Section 564(b)(1) of the Act, 21 U.S.C. section 360bbb-3(b)(1), unless the authorization is terminated or revoked.  Performed at [facility], [address] [place], [place] [address]   Aerobic/Anaerobic Culture w Gram Stain (surgical/deep wound)     Status: None (Preliminary result)   Collection Time: 11/12/20  1:01 PM   Specimen: Wound  Result Value Ref Range Status   Specimen Description   Final    WOUND Performed at [facility], [address] [place], [place] [address]    Special Requests   Final    LEFT FOOT Performed at [facility], [address] [place], [place] [address]    Gram Stain NO WBC SEEN ABUNDANT GRAM POSITIVE COCCI   Final   Culture   Final    ABUNDANT STAPHYLOCOCCUS AUREUS CULTURE REINCUBATED FOR BETTER GROWTH Performed at [facility], [place] [address], [place], [place] [address]    Report Status PENDING  Incomplete   Organism ID, Bacteria STAPHYLOCOCCUS AUREUS  Final      Susceptibility   Staphylococcus aureus - MIC*    CIPROFLOXACIN <=0.5 SENSITIVE Sensitive     ERYTHROMYCIN >=8  RESISTANT Resistant     GENTAMICIN <=0.5 SENSITIVE Sensitive     OXACILLIN <=0.25 SENSITIVE Sensitive     TETRACYCLINE <=1 SENSITIVE Sensitive     VANCOMYCIN 1 SENSITIVE Sensitive     TRIMETH/SULFA <=10 SENSITIVE Sensitive     CLINDAMYCIN RESISTANT Resistant     RIFAMPIN <=0.5 SENSITIVE Sensitive     Inducible Clindamycin POSITIVE Resistant     * ABUNDANT STAPHYLOCOCCUS AUREUS  Culture, blood (Routine X 2) w Reflex to ID Panel     Status: Abnormal (Preliminary result)   Collection Time: 11/12/20 11:53 PM   Specimen: BLOOD  Result Value Ref Range Status   Specimen Description   Final    BLOOD BLOOD LEFT HAND Performed at [facility], [address], [place], [place] [address]    Special Requests   Final    BOTTLES DRAWN AEROBIC AND ANAEROBIC Blood Culture adequate volume Performed at [facility], [address] [place], [place] [address]    Culture  Setup Time   Final    Organism ID to follow ANAEROBIC BOTTLE ONLY GRAM POSITIVE COCCI CRITICAL RESULT CALLED TO, READ BACK BY AND VERIFIED WITH: SUSAN WATSON 11/13/20 1629 KLW Performed at [facility], [place],  [address]    Culture (A)  Final    ENTEROCOCCUS FAECALIS SUSCEPTIBILITIES PERFORMED ON PREVIOUS CULTURE WITHIN THE LAST 5 DAYS. Performed at [facility], [place] [address], [place],  [address]    Report Status PENDING  Incomplete  Culture, blood (Routine X 2) w Reflex to ID Panel     Status: None (Preliminary result)   Collection Time: 11/12/20 11:53 PM   Specimen: BLOOD  Result Value Ref Range Status   Specimen Description BLOOD BLOOD RIGHT HAND  Final   Special Requests   Final    BOTTLES DRAWN AEROBIC AND ANAEROBIC Blood Culture results may not be optimal due to an inadequate volume of blood received in culture bottles   Culture   Final    NO GROWTH 2  DAYS Performed at [facility], [address] [place], [place] [address]    Report  Status PENDING  Incomplete  Blood Culture ID Panel (Reflexed)     Status: Abnormal   Collection Time: 11/12/20 11:53 PM  Result Value Ref Range Status   Enterococcus faecalis DETECTED (A) NOT DETECTED Final    Comment: CRITICAL RESULT CALLED TO, READ BACK BY AND VERIFIED WITH: SUSAN WATSON 11/13/20 1629 KLW    Enterococcus Faecium NOT DETECTED NOT DETECTED Final   Listeria monocytogenes NOT DETECTED NOT DETECTED Final   Staphylococcus species NOT DETECTED NOT DETECTED Final   Staphylococcus aureus (BCID) NOT DETECTED NOT DETECTED Final   Staphylococcus epidermidis NOT DETECTED NOT DETECTED Final   Staphylococcus lugdunensis NOT DETECTED NOT DETECTED Final   Streptococcus species NOT DETECTED NOT DETECTED Final   Streptococcus agalactiae NOT DETECTED NOT DETECTED Final   Streptococcus pneumoniae NOT DETECTED NOT DETECTED Final   Streptococcus pyogenes NOT DETECTED NOT DETECTED Final   A.calcoaceticus-baumannii NOT DETECTED NOT DETECTED Final   Bacteroides fragilis NOT DETECTED NOT DETECTED Final   Enterobacterales NOT DETECTED NOT DETECTED Final   Enterobacter cloacae complex NOT DETECTED NOT DETECTED Final   Escherichia coli NOT DETECTED NOT DETECTED Final   Klebsiella aerogenes NOT DETECTED NOT DETECTED Final   Klebsiella oxytoca NOT DETECTED NOT DETECTED Final   Klebsiella pneumoniae NOT DETECTED NOT DETECTED Final   Proteus species NOT DETECTED NOT DETECTED Final   Salmonella species NOT DETECTED NOT DETECTED Final   Serratia marcescens NOT DETECTED NOT DETECTED Final   Haemophilus influenzae NOT DETECTED NOT DETECTED Final   Neisseria meningitidis NOT DETECTED NOT DETECTED Final   Pseudomonas aeruginosa NOT DETECTED NOT DETECTED Final   Stenotrophomonas maltophilia NOT DETECTED NOT DETECTED Final   Candida albicans NOT DETECTED NOT DETECTED Final   Candida auris NOT DETECTED NOT DETECTED Final   Candida glabrata NOT DETECTED NOT DETECTED Final   Candida krusei NOT DETECTED NOT  DETECTED Final   Candida parapsilosis NOT DETECTED NOT DETECTED Final   Candida tropicalis NOT DETECTED NOT DETECTED Final   Cryptococcus neoformans/gattii NOT DETECTED NOT DETECTED Final   Vancomycin resistance NOT DETECTED NOT DETECTED Final    Comment: Performed at [facility], [address] [place], [place] [address]  CULTURE, BLOOD (ROUTINE X 2) w Reflex to ID Panel     Status: None (Preliminary result)   Collection Time: 11/14/20 12:04 AM   Specimen: BLOOD  Result Value Ref Range Status   Specimen Description BLOOD BLOOD RIGHT HAND  Final   Special Requests   Final    BOTTLES DRAWN AEROBIC AND ANAEROBIC Blood Culture adequate volume   Culture   Final    NO GROWTH 1 DAY Performed at [facility], [address] [place],  [address]    Report Status PENDING  Incomplete  CULTURE, BLOOD (ROUTINE X 2) w Reflex to ID Panel     Status: None (Preliminary result)   Collection Time: 11/14/20 12:14 AM   Specimen: BLOOD  Result Value Ref Range Status   Specimen Description BLOOD BLOOD LEFT HAND  Final   Special Requests   Final    BOTTLES DRAWN AEROBIC AND ANAEROBIC Blood Culture results may not be optimal due to an excessive volume of blood received in culture bottles   Culture   Final    NO GROWTH 1 DAY Performed at [facility], [address], [place],  [address]    Report Status PENDING  Incomplete  Culture, blood (Routine X 2) w Reflex to ID Panel     Status: None (Preliminary result)   Collection Time: 11/15/20  4:17 AM   Specimen: BLOOD  Result Value Ref Range Status   Specimen Description BLOOD BLOOD RIGHT HAND  Final   Special Requests   Final    BOTTLES DRAWN AEROBIC AND ANAEROBIC Blood Culture adequate volume   Culture   Final    NO GROWTH < 12 HOURS Performed at [facility], [address], [place], [place] [address]    Report Status PENDING  Incomplete  Culture, blood (Routine X 2) w Reflex to ID Panel      Status: None (Preliminary result)   Collection Time: 11/15/20  4:18 AM   Specimen: BLOOD  Result Value Ref Range Status   Specimen Description BLOOD BLOOD LEFT HAND  Final   Special Requests   Final    BOTTLES DRAWN AEROBIC AND ANAEROBIC Blood Culture adequate volume   Culture   Final    NO GROWTH < 12 HOURS Performed at [facility], [address], [place], [place] [address]    Report Status PENDING  Incomplete         Radiology Studies: US Abdomen Complete  Result Date: 11/13/2020 CLINICAL DATA:  Ascites. EXAM: ABDOMEN ULTRASOUND COMPLETE COMPARISON:  None. FINDINGS: Gallbladder: Status post cholecystectomy. Common bile duct: Diameter: 3 mm which is within normal limits. Liver: No focal lesion identified. Within normal limits in parenchymal echogenicity. Portal vein is patent on color Doppler imaging with normal direction of blood flow towards the liver. IVC: No abnormality visualized. Pancreas: Visualized portion unremarkable. Spleen: Maximum measured diameter of 15.7 cm with calculated volume of 11 122 cubic cm consistent with moderate splenomegaly. No focal abnormality is noted. Right Kidney: Length: 10.7 cm. Echogenicity within normal limits. No mass or hydronephrosis visualized. Left Kidney: Length: 10.1 cm. Echogenicity within normal limits. No mass or hydronephrosis visualized. Abdominal aorta: No aneurysm visualized. Other findings: Possible minimal ascites is noted around the liver. IMPRESSION: Status post cholecystectomy. Possible minimal ascites. No other abnormality seen in the abdomen. Electronically Signed   By: Marijo Conception M.D.   On: 11/13/2020 16:13   ECHO TEE  Result Date: 11/15/2020    TRANSESOPHOGEAL ECHO REPORT   Patient Name:   [name] Date of Exam: 11/14/2020 Medical Rec #:  [id number]     Height:       70.0 in Accession #:    [id number]    Weight:       180.8 lb Date of Birth:  1951-01-09    BSA:          2.000 m Patient Age:    75 years      BP:            112/65 mmHg Patient Gender: M             HR:           95 bpm. Exam Location:  [facility] Procedure: Transesophageal Echo, Color Doppler and Cardiac Doppler Indications:     Endocarditis  History:         Patient has prior history of Echocardiogram examinations, most                  recent 11/12/2020. Risk Factors:Hypertension, Diabetes and                  Dyslipidemia.  Sonographer:     Charmayne Sheer RDCS (AE)  Referring Phys:  [place] Diagnosing Phys: Serafina Royals MD PROCEDURE: The transesophogeal probe was passed without difficulty through the esophogus of the patient. Sedation performed by performing physician. The patient developed no complications during the procedure. IMPRESSIONS  1. Left ventricular ejection fraction, by estimation, is 50 to 55%. The left ventricle has low normal function.  2. Right ventricular systolic function is normal. The right ventricular size is normal.  3. Left atrial size was mildly dilated. No left atrial/left atrial appendage thrombus was detected.  4. Right atrial size was mildly dilated.  5. The mitral valve is normal in structure. Mild mitral valve regurgitation.  6. The aortic valve is normal in structure. Aortic valve regurgitation is trivial.  7. There is mild (Grade II) plaque.  8. Agitated saline contrast bubble study was negative, with no evidence of any interatrial shunt. FINDINGS  Left Ventricle: Left ventricular ejection fraction, by estimation, is 50 to 55%. The left ventricle has low normal function. The left ventricular internal cavity size was normal in size. Right Ventricle: The right ventricular size is normal. No increase in right ventricular wall thickness. Right ventricular systolic function is normal. Left Atrium: Left atrial size was mildly dilated. No left atrial/left atrial appendage thrombus was detected. Right Atrium: Right atrial size was mildly dilated. Prominent Eustachian valve. Pericardium: There is no evidence of pericardial  effusion. Mitral Valve: The mitral valve is normal in structure. Mild mitral valve regurgitation. There is no evidence of mitral valve vegetation. Tricuspid Valve: The tricuspid valve is normal in structure. Tricuspid valve regurgitation is trivial. There is no evidence of tricuspid valve vegetation. Aortic Valve: The aortic valve is normal in structure. Aortic valve regurgitation is trivial. There is no evidence of aortic valve vegetation. Pulmonic Valve: The pulmonic valve was normal in structure. Pulmonic valve regurgitation is trivial. There is no evidence of pulmonic valve vegetation. Aorta: The aortic root and ascending aorta are structurally normal, with no evidence of dilitation. There is mild (Grade II) plaque. IAS/Shunts: No atrial level shunt detected by color flow Doppler. Agitated saline contrast was given intravenously to evaluate for intracardiac shunting. Agitated saline contrast bubble study was negative, with no evidence of any interatrial shunt. There  is no evidence of a patent foramen ovale. No ventricular septal defect is seen or detected. There is no evidence of an atrial septal defect. Serafina Royals MD Electronically signed by Serafina Royals MD Signature Date/Time: 11/15/2020/7:50:19 AM    Final         Scheduled Meds: . aspirin EC  81 mg Oral Daily  . clopidogrel  75 mg Oral Daily  . collagenase   Topical Daily  . enoxaparin (LOVENOX) injection  40 mg Subcutaneous Q24H  . fluticasone  2 spray Each Nare Daily  . gabapentin  300 mg Oral TID  . insulin aspart  0-15 Units Subcutaneous TID WC  . insulin aspart  0-5 Units Subcutaneous QHS  . mometasone-formoterol  2 puff Inhalation BID  . oxybutynin  5 mg Oral BID  . pantoprazole  40 mg Oral Daily   Or  . pantoprazole (PROTONIX) IV  40 mg Intravenous Daily  . pravastatin  10 mg Oral q1800   Continuous Infusions: . sodium chloride Stopped (11/13/20 1206)  . DAPTOmycin (CUBICIN)  IV 650 mg (11/14/20 2348)     LOS: 4 days    Time spent= 35 mins    Nawaal Alling Arsenio Loader, MD [facility]  If 7PM-7AM, please contact night-coverage  11/15/2020, 11:56 AM

## 2020-11-15 NOTE — Progress Notes (Signed)
Physical Therapy Treatment Patient Details Name: [name] MRN: [id number] DOB: March 25, 1951 Today's Date: 11/15/2020    History of Present Illness [name] is a 70 y.o. male with medical history significant for hypertension, nonischemic cardiomyopathy, non-insulin-dependent DM, peripheral neuropathy, and PAD, who presents to the ED following a visit with his PCP for SOB and swelling and pain in L LE. He endorses weakness and shortness of breath with exertion and increased sleeping and poor PO intake. He reports this has been ongoing for the last 2 weeks. He further endorses nausea and diarrhea x 2 weeks, with worsening the in last 2 days prompting evaluation at PCP. Pt reports unchanged baseline dry cough. He denies chest pain, abdominal pain, dysuria, hematuria. He endorses left lower extremity edema that has been ongoing for the last 2 weeks and worsening in the last 2 days. At PCP, he was advised to present to the ED due to his left lower extremity swelling and shortness of breath. Pt reports he was aware of the diabetic foot ulcer on the medial great toe, however he was unaware of the wound on the left heel.    PT Comments    Pt seen for PT treatment with family member present. Pt is able to ambulate increased distances on this date with RW & CGA but pt weight bearing through forefoot/toes of LLE vs maintaining TDWB despite education. Gait distances limited by SOB & seated rest breaks required. Pt notes he has rollator at home with PT educating him on recommendation of using RW at this time & pt agreeable. Will continue to see pt acutely to address gait, transfers, & balance while maintaining weight bearing precautions.   Follow Up Recommendations  Home health PT     Equipment Recommendations  Rolling walker with 5" wheels    Recommendations for Other Services       Precautions / Restrictions Precautions Precautions: Fall Restrictions Weight Bearing Restrictions: Yes LLE Weight  Bearing: Touchdown weight bearing Other Position/Activity Restrictions: no heel contact. needs surgical shoe (size 11 shoe)    Mobility  Bed Mobility               General bed mobility comments: not observed, pt received & left sitting in recliner    Transfers Overall transfer level: Needs assistance Equipment used: Rolling walker (2 wheeled) Transfers: Sit to/from Stand Sit to Stand: Min guard         General transfer comment: cuing for weight bearing  Ambulation/Gait Ambulation/Gait assistance: Min guard Gait Distance (Feet): 25 Feet (+ 25 ft) Assistive device: Rolling walker (2 wheeled) Gait Pattern/deviations: Decreased step length - left;Decreased step length - right     General Gait Details: Pt maintains PWB LLE through toes/forefoot vs TDWB with PT providing ongoing cuing for overall technique. Pt maintains no heel contact throughout. Somewhat unsteady during first trial with improvement on 2nd.   Stairs             Wheelchair Mobility    Modified Rankin (Stroke Patients Only)       Balance Overall balance assessment: Mild deficits observed, not formally tested;Needs assistance           Standing balance-Leahy Scale: Fair Standing balance comment: BUE support on RW during gait                            Cognition Arousal/Alertness: Awake/alert Behavior During Therapy: WFL for tasks assessed/performed Overall Cognitive Status: Within Functional  Limits for tasks assessed                                        Exercises      General Comments General comments (skin integrity, edema, etc.): PT educated pt on need to use RW vs rollator at home; pt requires seated rest breaks 2/2 SOB with PT educating him on pursed lip breathing, lowest SpO2 87% but quickly recovers to >/= 90%, HR up to 123 bpm after gait, pt with productive cough during session      Pertinent Vitals/Pain Pain Assessment: No/denies pain    Home  Living                      Prior Function            PT Goals (current goals can now be found in the care plan section) Acute Rehab PT Goals Patient Stated Goal: to get home PT Goal Formulation: With patient Time For Goal Achievement: 11/27/20 Potential to Achieve Goals: Good Progress towards PT goals: Progressing toward goals    Frequency    Min 2X/week      PT Plan Current plan remains appropriate    Co-evaluation              AM-PAC PT "6 Clicks" Mobility   Outcome Measure  Help needed turning from your back to your side while in a flat bed without using bedrails?: None Help needed moving from lying on your back to sitting on the side of a flat bed without using bedrails?: None Help needed moving to and from a bed to a chair (including a wheelchair)?: A Little Help needed standing up from a chair using your arms (e.g., wheelchair or bedside chair)?: A Little Help needed to walk in hospital room?: A Little Help needed climbing 3-5 steps with a railing? : A Lot 6 Click Score: 19    End of Session Equipment Utilized During Treatment: Gait belt Activity Tolerance: Patient tolerated treatment well Patient left: in chair;with call bell/phone within reach;with family/visitor present   PT Visit Diagnosis: Unsteadiness on feet (R26.81);Difficulty in walking, not elsewhere classified (R26.2)     Time: 7858-8502 PT Time Calculation (min) (ACUTE ONLY): 16 min  Charges:  $Therapeutic Activity: 8-22 mins                     Lavone Nian, PT, DPT 11/15/20, 4:24 PM    Waunita Schooner 11/15/2020, 4:22 PM

## 2020-11-15 NOTE — Progress Notes (Signed)
PHARMACY - PHYSICIAN COMMUNICATION CRITICAL VALUE ALERT - BLOOD CULTURE IDENTIFICATION (BCID)  [name] is a 70 y.o. male admitted on 11/11/2020 with bacteremia. Pt presented with L foot wounds, s/p debridement on 2/16. Pt has an allergy to penicillins with reaction of severe mouth ulcers occurring with both penicillin and ampicillin.   Assessment:  1/4 bottles e. Faecalis with no resistance genes detected  (repeat blood cultures)  Name of physician (or Provider) Contacted: N/A  Current antibiotics: Daptomycin  Changes to prescribed antibiotics recommended:  Patient is on recommended antibiotics - No changes needed, ID following   Results for orders placed or performed during the hospital encounter of 11/11/20  Blood Culture ID Panel (Reflexed) (Collected: 11/15/2020  4:18 AM)  Result Value Ref Range   Enterococcus faecalis DETECTED (A) NOT DETECTED   Enterococcus Faecium NOT DETECTED NOT DETECTED   Listeria monocytogenes NOT DETECTED NOT DETECTED   Staphylococcus species NOT DETECTED NOT DETECTED   Staphylococcus aureus (BCID) NOT DETECTED NOT DETECTED   Staphylococcus epidermidis NOT DETECTED NOT DETECTED   Staphylococcus lugdunensis NOT DETECTED NOT DETECTED   Streptococcus species NOT DETECTED NOT DETECTED   Streptococcus agalactiae NOT DETECTED NOT DETECTED   Streptococcus pneumoniae NOT DETECTED NOT DETECTED   Streptococcus pyogenes NOT DETECTED NOT DETECTED   A.calcoaceticus-baumannii NOT DETECTED NOT DETECTED   Bacteroides fragilis NOT DETECTED NOT DETECTED   Enterobacterales NOT DETECTED NOT DETECTED   Enterobacter cloacae complex NOT DETECTED NOT DETECTED   Escherichia coli NOT DETECTED NOT DETECTED   Klebsiella aerogenes NOT DETECTED NOT DETECTED   Klebsiella oxytoca NOT DETECTED NOT DETECTED   Klebsiella pneumoniae NOT DETECTED NOT DETECTED   Proteus species NOT DETECTED NOT DETECTED   Salmonella species NOT DETECTED NOT DETECTED   Serratia marcescens NOT  DETECTED NOT DETECTED   Haemophilus influenzae NOT DETECTED NOT DETECTED   Neisseria meningitidis NOT DETECTED NOT DETECTED   Pseudomonas aeruginosa NOT DETECTED NOT DETECTED   Stenotrophomonas maltophilia NOT DETECTED NOT DETECTED   Candida albicans NOT DETECTED NOT DETECTED   Candida auris NOT DETECTED NOT DETECTED   Candida glabrata NOT DETECTED NOT DETECTED   Candida krusei NOT DETECTED NOT DETECTED   Candida parapsilosis NOT DETECTED NOT DETECTED   Candida tropicalis NOT DETECTED NOT DETECTED   Cryptococcus neoformans/gattii NOT DETECTED NOT DETECTED   Vancomycin resistance NOT DETECTED NOT DETECTED    Dorothe Pea, PharmD, BCPS Clinical Pharmacist  11/15/2020  10:45 PM

## 2020-11-16 DIAGNOSIS — E1165 Type 2 diabetes mellitus with hyperglycemia: Secondary | ICD-10-CM | POA: Diagnosis not present

## 2020-11-16 DIAGNOSIS — I10 Essential (primary) hypertension: Secondary | ICD-10-CM | POA: Diagnosis not present

## 2020-11-16 DIAGNOSIS — I428 Other cardiomyopathies: Secondary | ICD-10-CM | POA: Diagnosis not present

## 2020-11-16 DIAGNOSIS — K219 Gastro-esophageal reflux disease without esophagitis: Secondary | ICD-10-CM | POA: Diagnosis not present

## 2020-11-16 LAB — CBC
HCT: 31.6 % — ABNORMAL LOW (ref 39.0–52.0)
Hemoglobin: 10.4 g/dL — ABNORMAL LOW (ref 13.0–17.0)
MCH: 28.6 pg (ref 26.0–34.0)
MCHC: 32.9 g/dL (ref 30.0–36.0)
MCV: 86.8 fL (ref 80.0–100.0)
Platelets: 191 10*3/uL (ref 150–400)
RBC: 3.64 MIL/uL — ABNORMAL LOW (ref 4.22–5.81)
RDW: 13.5 % (ref 11.5–15.5)
WBC: 4.7 10*3/uL (ref 4.0–10.5)
nRBC: 0 % (ref 0.0–0.2)

## 2020-11-16 LAB — BASIC METABOLIC PANEL
Anion gap: 9 (ref 5–15)
BUN: 8 mg/dL (ref 8–23)
CO2: 24 mmol/L (ref 22–32)
Calcium: 8.3 mg/dL — ABNORMAL LOW (ref 8.9–10.3)
Chloride: 98 mmol/L (ref 98–111)
Creatinine, Ser: 0.83 mg/dL (ref 0.61–1.24)
GFR, Estimated: >60 mL/min (ref >60)
Glucose, Bld: 198 mg/dL — ABNORMAL HIGH (ref 70–99)
Potassium: 4 mmol/L (ref 3.5–5.1)
Sodium: 131 mmol/L — ABNORMAL LOW (ref 135–145)

## 2020-11-16 LAB — CULTURE, BLOOD (ROUTINE X 2): Special Requests: ADEQUATE

## 2020-11-16 LAB — GLUCOSE, CAPILLARY
Glucose-Capillary: 189 mg/dL — ABNORMAL HIGH (ref 70–99)
Glucose-Capillary: 178 mg/dL — ABNORMAL HIGH (ref 70–99)
Glucose-Capillary: 184 mg/dL — ABNORMAL HIGH (ref 70–99)
Glucose-Capillary: 167 mg/dL — ABNORMAL HIGH (ref 70–99)

## 2020-11-16 LAB — AEROBIC/ANAEROBIC CULTURE W GRAM STAIN (SURGICAL/DEEP WOUND): Gram Stain: NONE SEEN

## 2020-11-16 LAB — MAGNESIUM: Magnesium: 1.9 mg/dL (ref 1.7–2.4)

## 2020-11-16 MED ORDER — POLYETHYLENE GLYCOL 3350 17 G PO PACK
17.0000 g | PACK | Freq: Every day | ORAL | Status: DC
  Administered 2020-11-16 – 2020-11-24 (×7): 17 g via ORAL
  Filled 2020-11-16 (×9): qty 1

## 2020-11-16 NOTE — Progress Notes (Signed)
PROGRESS NOTE    CLAUDIE RATHBONE  BTD:[id number] DOB: [birth year] DOA: 11/11/2020 PCP: [name], MD   Brief Narrative:  70 year old with history of essential hypertension, nonischemic cardiomyopathy, DM2, peripheral neuropathy, PAD, history of tobacco use quit 1998 presents with shortness of breath, poor oral intake nausea, diarrhea with left lower extremity swelling with worsening diabetic foot infection.  Patient has been fully vaccinated and boosted against COVID-19. Lower extremity Dopplers negative for DVT, CTA chest showed pleural effusion/atelectasis pleural effusion and atelectasis but no PE.  Blood cultures are positive for gram-positive cocci.  Surveillance cultures ordered.  Cardiology consulted for TEE which was negative.  Infectious disease recommended 6 weeks of IV daptomycin.  Plans to place PICC line once repeat cultures have remained clear of bacteremia.  Assessment & Plan:   Principal Problem:   Sepsis ([place]) Active Problems:   PAD (peripheral artery disease) ([place])   Diabetes ([place])   Mixed hyperlipidemia   Essential hypertension   GERD (gastroesophageal reflux disease)   Uncontrolled type 2 diabetes mellitus with hyperglycemia ([facility])   Cardiomyopathy, nonischemic ([place])   Primary generalized (osteo)arthritis   Moderate asthma without complication   Diabetic foot ulcer ([place])  Sepsis secondary to diabetic foot infection of his left lower extremity with ulceration and cellulitis nonpurulent MSSA/Enterococcus bacteremia, persistent bacteremia Left lower extremity leg pain -Sepsis physiology appears to be improving. -MRI foot-showing cellulitis and myositis but no evidence of osteomyelitis -Antibiotics-6 weeks of IV daptomycin per infectious disease. -Patient repeat cultures remain positive therefore repeat blood cultures have been ordered this morning.  Continue surveillance cultures, once they are negative for 48 hours we will plan on placing PICC  line. -Podiatry-dressing changes, culture sent.  No surgical indication at this time -ID following -Vascular-plans for angiogram on 2/18 with possible interventions -Lower extremity Dopplers-negative for DVT -Echocardiogram-EF 60 to 65% -TEE 2/18-negative for endocarditis  Peripheral arterial disease - Aspirin Plavix and statin -Vascular surgery planning on angiogram on Monday  Chronic pain   -Pain control with bowel regimen  Shortness of breath with bilateral pleural effusion/atelectasis without hypoxia, improved -Out of bed to chair.  Incentive spirometer.  As needed bronchodilators liters -Echocardiogram -EF 60 to 65%, no evidence of endocarditis  Diabetes mellitus type 2, in acceptable range -Peripheral neuropathy secondary to DM2 -Insulin sliding scale and Accu-Chek -Gabapentin 3 times daily  Hyperlipidemia Cont Statin   DVT prophylaxis: Lovenox Code Status: DNR Family Communication:    Status is: Inpatient  Remains inpatient appropriate because:Inpatient level of care appropriate due to severity of illness   Dispo: The patient is from: Home              Anticipated d/c is to: SNF              Anticipated d/c date is: 2-3 days              Patient currently is not medically stable to d/c.  Awaiting bacteremia to clear up so PICC line can be placed for IV antibiotics upon discharge   Difficult to place patient No    Body mass index is 25.94 kg/m.     Subjective: No complaints patient feels okay.  Remains afebrile overnight.  Review of Systems Otherwise negative except as per HPI, including: General: Denies fever, chills, night sweats or unintended weight loss. Resp: Denies cough, wheezing, shortness of breath. Cardiac: Denies chest pain, palpitations, orthopnea, paroxysmal nocturnal dyspnea. GI: Denies abdominal pain, nausea, vomiting, diarrhea or constipation GU: Denies dysuria, frequency, hesitancy  or incontinence MS: Denies muscle aches, joint pain  or swelling Neuro: Denies headache, neurologic deficits (focal weakness, numbness, tingling), abnormal gait Psych: Denies anxiety, depression, SI/HI/AVH Skin: Denies new rashes or lesions ID: Denies sick contacts, exotic exposures, travel  Examination: Constitutional: Not in acute distress Respiratory: Clear to auscultation bilaterally Cardiovascular: Normal sinus rhythm, no rubs Abdomen: Nontender nondistended good bowel sounds Musculoskeletal: No edema noted Skin: Left lower extremity dressing in place Neurologic: CN 2-12 grossly intact.  And nonfocal Psychiatric: Normal judgment and insight. Alert and oriented x 3. Normal mood.  Objective: Vitals:   11/15/20 1944 11/16/20 0410 11/16/20 0804 11/16/20 1122  BP: 131/68 (!) 146/82 134/79 110/77  Pulse: 99 (!) 105 (!) 101 99  Resp: 20 18 18 18   Temp: 98.3 F (36.8 C) 97.9 F (36.6 C) 98.3 F (36.8 C) 98.3 F (36.8 C)  TempSrc: Oral Oral Oral Oral  SpO2: 100% 96% 97% 95%  Weight:      Height:        Intake/Output Summary (Last 24 hours) at 11/16/2020 1137 Last data filed at 11/16/2020 1100 Gross per 24 hour  Intake 700 ml  Output 2350 ml  Net -1650 ml   Filed Weights   11/11/20 1452  Weight: 82 kg     Data Reviewed:   CBC: Recent Labs  Lab 11/12/20 0615 11/13/20 0636 11/14/20 0014 11/15/20 0417 11/16/20 0523  WBC 5.9 5.2 4.6 4.9 4.7  HGB 10.2* 9.4* 9.7* 10.2* 10.4*  HCT 30.7* 29.4* 29.6* 32.5* 31.6*  MCV 87.0 88.3 87.6 88.6 86.8  PLT 165 149* 166 176 376   Basic Metabolic Panel: Recent Labs  Lab 11/12/20 0615 11/13/20 0636 11/14/20 0014 11/15/20 0417 11/16/20 0523  NA 134* 132* 133* 136 131*  K 4.4 4.0 4.0 4.3 4.0  CL 100 97* 100 101 98  CO2 27 26 25 29 24   GLUCOSE 111* 148* 165* 115* 198*  BUN 15 13 12  7* 8  CREATININE 0.91 0.97 0.94 0.85 0.83  CALCIUM 8.0* 7.9* 7.9* 8.0* 8.3*  MG  --  2.0 2.1 2.1 1.9   GFR: Estimated Creatinine Clearance: 89.2 mL/min (by C-G formula based on SCr of 0.83  mg/dL). Liver Function Tests: Recent Labs  Lab 11/11/20 1513  AST 23  ALT 22  ALKPHOS 56  BILITOT 1.2  PROT 6.0*  ALBUMIN 2.7*   No results for input(s): LIPASE, AMYLASE in the last 168 hours. No results for input(s): AMMONIA in the last 168 hours. Coagulation Profile: Recent Labs  Lab 11/12/20 0615  INR 1.2   Cardiac Enzymes: Recent Labs  Lab 11/14/20 0014  CKTOTAL 26*   BNP (last 3 results) No results for input(s): PROBNP in the last 8760 hours. HbA1C: No results for input(s): HGBA1C in the last 72 hours. CBG: Recent Labs  Lab 11/15/20 0803 11/15/20 1201 11/15/20 1645 11/15/20 2127 11/16/20 0747  GLUCAP 155* 130* 137* 169* 189*   Lipid Profile: No results for input(s): CHOL, HDL, LDLCALC, TRIG, CHOLHDL, LDLDIRECT in the last 72 hours. Thyroid Function Tests: No results for input(s): TSH, T4TOTAL, FREET4, T3FREE, THYROIDAB in the last 72 hours. Anemia Panel: No results for input(s): VITAMINB12, FOLATE, FERRITIN, TIBC, IRON, RETICCTPCT in the last 72 hours. Sepsis Labs: Recent Labs  Lab 11/11/20 1513 11/11/20 1638 11/11/20 1845  PROCALCITON  --   --  0.34  LATICACIDVEN 1.1 2.1* 1.2    Recent Results (from the past 240 hour(s))  Blood culture (routine x 2)     Status: Abnormal  Collection Time: 11/11/20  3:13 PM   Specimen: BLOOD  Result Value Ref Range Status   Specimen Description   Final    BLOOD RIGHT ANTECUBITAL Performed at [facility], [address] [place], [place] [address]    Special Requests   Final    BOTTLES DRAWN AEROBIC AND ANAEROBIC Blood Culture adequate volume Performed at [facility], [address] [place], [place] [address]    Culture  Setup Time   Final    GRAM POSITIVE COCCI IN BOTH AEROBIC AND ANAEROBIC BOTTLES Organism ID to follow CRITICAL RESULT CALLED TO, READ BACK BY AND VERIFIED WITHVioleta Gelinas PHARMD [id number] 10/28/20 HNM Performed at [facility], [address]  [place], [place] [address]    Culture ENTEROCOCCUS FAECALIS STAPHYLOCOCCUS AUREUS  (A)  Final   Report Status 11/15/2020 FINAL  Final   Organism ID, Bacteria ENTEROCOCCUS FAECALIS  Final   Organism ID, Bacteria STAPHYLOCOCCUS AUREUS  Final      Susceptibility   Enterococcus faecalis - MIC*    AMPICILLIN <=2 SENSITIVE Sensitive     VANCOMYCIN 1 SENSITIVE Sensitive     GENTAMICIN SYNERGY SENSITIVE Sensitive     * ENTEROCOCCUS FAECALIS   Staphylococcus aureus - MIC*    CIPROFLOXACIN <=0.5 SENSITIVE Sensitive     ERYTHROMYCIN >=8 RESISTANT Resistant     GENTAMICIN <=0.5 SENSITIVE Sensitive     OXACILLIN <=0.25 SENSITIVE Sensitive     TETRACYCLINE <=1 SENSITIVE Sensitive     VANCOMYCIN <=0.5 SENSITIVE Sensitive     TRIMETH/SULFA <=10 SENSITIVE Sensitive     CLINDAMYCIN RESISTANT Resistant     RIFAMPIN <=0.5 SENSITIVE Sensitive     Inducible Clindamycin POSITIVE Resistant     * STAPHYLOCOCCUS AUREUS  Blood Culture ID Panel (Reflexed)     Status: Abnormal   Collection Time: 11/11/20  3:13 PM  Result Value Ref Range Status   Enterococcus faecalis DETECTED (A) NOT DETECTED Final    Comment: CRITICAL RESULT CALLED TO, READ BACK BY AND VERIFIED WITH: Violeta Gelinas PHAMRD [id number] 11/12/20 HNM    Enterococcus Faecium NOT DETECTED NOT DETECTED Final   Listeria monocytogenes NOT DETECTED NOT DETECTED Final   Staphylococcus species NOT DETECTED NOT DETECTED Final   Staphylococcus aureus (BCID) NOT DETECTED NOT DETECTED Final   Staphylococcus epidermidis NOT DETECTED NOT DETECTED Final   Staphylococcus lugdunensis NOT DETECTED NOT DETECTED Final   Streptococcus species NOT DETECTED NOT DETECTED Final   Streptococcus agalactiae NOT DETECTED NOT DETECTED Final   Streptococcus pneumoniae NOT DETECTED NOT DETECTED Final   Streptococcus pyogenes NOT DETECTED NOT DETECTED Final   A.calcoaceticus-baumannii NOT DETECTED NOT DETECTED Final   Bacteroides fragilis NOT DETECTED NOT DETECTED Final    Enterobacterales NOT DETECTED NOT DETECTED Final   Enterobacter cloacae complex NOT DETECTED NOT DETECTED Final   Escherichia coli NOT DETECTED NOT DETECTED Final   Klebsiella aerogenes NOT DETECTED NOT DETECTED Final   Klebsiella oxytoca NOT DETECTED NOT DETECTED Final   Klebsiella pneumoniae NOT DETECTED NOT DETECTED Final   Proteus species NOT DETECTED NOT DETECTED Final   Salmonella species NOT DETECTED NOT DETECTED Final   Serratia marcescens NOT DETECTED NOT DETECTED Final   Haemophilus influenzae NOT DETECTED NOT DETECTED Final   Neisseria meningitidis NOT DETECTED NOT DETECTED Final   Pseudomonas aeruginosa NOT DETECTED NOT DETECTED Final   Stenotrophomonas maltophilia NOT DETECTED NOT DETECTED Final   Candida albicans NOT DETECTED NOT DETECTED Final   Candida auris NOT DETECTED NOT DETECTED Final  Candida glabrata NOT DETECTED NOT DETECTED Final   Candida krusei NOT DETECTED NOT DETECTED Final   Candida parapsilosis NOT DETECTED NOT DETECTED Final   Candida tropicalis NOT DETECTED NOT DETECTED Final   Cryptococcus neoformans/gattii NOT DETECTED NOT DETECTED Final   Vancomycin resistance NOT DETECTED NOT DETECTED Final    Comment: Performed at [facility], [address] [place], [place] [address]  Blood culture (routine x 2)     Status: Abnormal   Collection Time: 11/11/20  4:38 PM   Specimen: BLOOD  Result Value Ref Range Status   Specimen Description   Final    BLOOD LEFT ANTECUBITAL Performed at [facility], [address] [place], [place] [address]    Special Requests   Final    BOTTLES DRAWN AEROBIC AND ANAEROBIC Blood Culture results may not be optimal due to an inadequate volume of blood received in culture bottles Performed at [facility], [address] [place], [place] [address]    Culture  Setup Time   Final    GRAM POSITIVE COCCI IN BOTH AEROBIC AND ANAEROBIC BOTTLES CRITICAL VALUE NOTED.  VALUE IS CONSISTENT WITH  PREVIOUSLY REPORTED AND CALLED VALUE. Performed at [facility], [address] [place], [place] [address]    Culture (A)  Final    ENTEROCOCCUS FAECALIS STAPHYLOCOCCUS AUREUS SUSCEPTIBILITIES PERFORMED ON PREVIOUS CULTURE WITHIN THE LAST 5 DAYS. Performed at [facility], [address] [address] [place], [place] [address]    Report Status 11/15/2020 FINAL  Final  Resp Panel by RT-PCR (Flu A&B, Covid) Nasopharyngeal Swab     Status: None   Collection Time: 11/11/20  6:45 PM   Specimen: Nasopharyngeal Swab; Nasopharyngeal(NP) swabs in vial transport medium  Result Value Ref Range Status   SARS Coronavirus 2 by RT PCR NEGATIVE NEGATIVE Final    Comment: (NOTE) SARS-CoV-2 target nucleic acids are NOT DETECTED.  The SARS-CoV-2 RNA is generally detectable in upper respiratory specimens during the acute phase of infection. The lowest concentration of SARS-CoV-2 viral copies this assay can detect is 138 copies/mL. A negative result does not preclude SARS-Cov-2 infection and should not be used as the sole basis for treatment or other patient management decisions. A negative result may occur with  improper specimen collection/handling, submission of specimen other than nasopharyngeal swab, presence of viral mutation(s) within the areas targeted by this assay, and inadequate number of viral copies(<138 copies/mL). A negative result must be combined with clinical observations, patient history, and epidemiological information. The expected result is Negative.  Fact Sheet for Patients:  EntrepreneurPulse.com.au  Fact Sheet for Healthcare Providers:  IncredibleEmployment.be  This test is no t yet approved or cleared by the [place] FDA and  has been authorized for detection and/or diagnosis of SARS-CoV-2 by FDA under an Emergency Use Authorization (EUA). This EUA will remain  in effect (meaning this test can be used) for the duration of  the COVID-19 declaration under Section 564(b)(1) of the Act, 21 U.S.C.section 360bbb-3(b)(1), unless the authorization is terminated  or revoked sooner.       Influenza A by PCR NEGATIVE NEGATIVE Final   Influenza B by PCR NEGATIVE NEGATIVE Final    Comment: (NOTE) The Xpert Xpress SARS-CoV-2/FLU/RSV plus assay is intended as an aid in the diagnosis of influenza from Nasopharyngeal swab specimens and should not be used as a sole basis for treatment. Nasal washings and aspirates are unacceptable for Xpert Xpress SARS-CoV-2/FLU/RSV testing.  Fact Sheet for Patients: EntrepreneurPulse.com.au  Fact Sheet for Healthcare Providers: IncredibleEmployment.be  This test is not yet approved or cleared by the [place] FDA and has been authorized for detection and/or diagnosis of SARS-CoV-2 by FDA under an Emergency Use Authorization (EUA). This EUA will remain in effect (meaning this test can be used) for the duration of the COVID-19 declaration under Section 564(b)(1) of the Act, 21 U.S.C. section 360bbb-3(b)(1), unless the authorization is terminated or revoked.  Performed at [facility], [address], [place], [place] [address]   Aerobic/Anaerobic Culture w Gram Stain (surgical/deep wound)     Status: None (Preliminary result)   Collection Time: 11/12/20  1:01 PM   Specimen: Wound  Result Value Ref Range Status   Specimen Description   Final    WOUND Performed at [facility], [address] [place], [place] [address]    Special Requests   Final    LEFT FOOT Performed at [facility], [address] [place], [place] [address]    Gram Stain NO WBC SEEN ABUNDANT GRAM POSITIVE COCCI   Final   Culture   Final    ABUNDANT STAPHYLOCOCCUS AUREUS MODERATE ENTEROCOCCUS FAECALIS FEW PREVOTELLA BIVIA BETA LACTAMASE POSITIVE CULTURE REINCUBATED FOR BETTER GROWTH Performed at [facility], [place]  [address] [place], [place] [address]    Report Status PENDING  Incomplete   Organism ID, Bacteria STAPHYLOCOCCUS AUREUS  Final      Susceptibility   Staphylococcus aureus - MIC*    CIPROFLOXACIN <=0.5 SENSITIVE Sensitive     ERYTHROMYCIN >=8 RESISTANT Resistant     GENTAMICIN <=0.5 SENSITIVE Sensitive     OXACILLIN <=0.25 SENSITIVE Sensitive     TETRACYCLINE <=1 SENSITIVE Sensitive     VANCOMYCIN 1 SENSITIVE Sensitive     TRIMETH/SULFA <=10 SENSITIVE Sensitive     CLINDAMYCIN RESISTANT Resistant     RIFAMPIN <=0.5 SENSITIVE Sensitive     Inducible Clindamycin POSITIVE Resistant     * ABUNDANT STAPHYLOCOCCUS AUREUS  Culture, blood (Routine X 2) w Reflex to ID Panel     Status: Abnormal   Collection Time: 11/12/20 11:53 PM   Specimen: BLOOD  Result Value Ref Range Status   Specimen Description   Final    BLOOD BLOOD LEFT HAND Performed at [facility], [address], [place], [place] [address]    Special Requests   Final    BOTTLES DRAWN AEROBIC AND ANAEROBIC Blood Culture adequate volume Performed at [facility], [address] [place], [place] [address]    Culture  Setup Time   Final    Organism ID to follow ANAEROBIC BOTTLE ONLY GRAM POSITIVE COCCI CRITICAL RESULT CALLED TO, READ BACK BY AND VERIFIED WITH: SUSAN WATSON 11/13/20 1629 KLW Performed at [facility], [place], [place] [address]    Culture (A)  Final    ENTEROCOCCUS FAECALIS SUSCEPTIBILITIES PERFORMED ON PREVIOUS CULTURE WITHIN THE LAST 5 DAYS. Performed at [facility], [place] [address] [place], [place] [address]    Report Status 11/16/2020 FINAL  Final  Culture, blood (Routine X 2) w Reflex to ID Panel     Status: None (Preliminary result)   Collection Time: 11/12/20 11:53 PM   Specimen: BLOOD  Result Value Ref Range Status   Specimen Description BLOOD BLOOD RIGHT HAND  Final   Special Requests   Final    BOTTLES DRAWN AEROBIC AND ANAEROBIC Blood Culture  results may not be optimal due to an inadequate volume  of blood received in culture bottles   Culture   Final    NO GROWTH 3 DAYS Performed at [facility], [address] [place], [place] [address]    Report Status PENDING  Incomplete  Blood Culture ID Panel (Reflexed)     Status: Abnormal   Collection Time: 11/12/20 11:53 PM  Result Value Ref Range Status   Enterococcus faecalis DETECTED (A) NOT DETECTED Final    Comment: CRITICAL RESULT CALLED TO, READ BACK BY AND VERIFIED WITH: SUSAN WATSON 11/13/20 1629 KLW    Enterococcus Faecium NOT DETECTED NOT DETECTED Final   Listeria monocytogenes NOT DETECTED NOT DETECTED Final   Staphylococcus species NOT DETECTED NOT DETECTED Final   Staphylococcus aureus (BCID) NOT DETECTED NOT DETECTED Final   Staphylococcus epidermidis NOT DETECTED NOT DETECTED Final   Staphylococcus lugdunensis NOT DETECTED NOT DETECTED Final   Streptococcus species NOT DETECTED NOT DETECTED Final   Streptococcus agalactiae NOT DETECTED NOT DETECTED Final   Streptococcus pneumoniae NOT DETECTED NOT DETECTED Final   Streptococcus pyogenes NOT DETECTED NOT DETECTED Final   A.calcoaceticus-baumannii NOT DETECTED NOT DETECTED Final   Bacteroides fragilis NOT DETECTED NOT DETECTED Final   Enterobacterales NOT DETECTED NOT DETECTED Final   Enterobacter cloacae complex NOT DETECTED NOT DETECTED Final   Escherichia coli NOT DETECTED NOT DETECTED Final   Klebsiella aerogenes NOT DETECTED NOT DETECTED Final   Klebsiella oxytoca NOT DETECTED NOT DETECTED Final   Klebsiella pneumoniae NOT DETECTED NOT DETECTED Final   Proteus species NOT DETECTED NOT DETECTED Final   Salmonella species NOT DETECTED NOT DETECTED Final   Serratia marcescens NOT DETECTED NOT DETECTED Final   Haemophilus influenzae NOT DETECTED NOT DETECTED Final   Neisseria meningitidis NOT DETECTED NOT DETECTED Final   Pseudomonas aeruginosa NOT DETECTED NOT DETECTED Final   Stenotrophomonas  maltophilia NOT DETECTED NOT DETECTED Final   Candida albicans NOT DETECTED NOT DETECTED Final   Candida auris NOT DETECTED NOT DETECTED Final   Candida glabrata NOT DETECTED NOT DETECTED Final   Candida krusei NOT DETECTED NOT DETECTED Final   Candida parapsilosis NOT DETECTED NOT DETECTED Final   Candida tropicalis NOT DETECTED NOT DETECTED Final   Cryptococcus neoformans/gattii NOT DETECTED NOT DETECTED Final   Vancomycin resistance NOT DETECTED NOT DETECTED Final    Comment: Performed at [facility], [address] [place], [place] [address]  CULTURE, BLOOD (ROUTINE X 2) w Reflex to ID Panel     Status: None (Preliminary result)   Collection Time: 11/14/20 12:04 AM   Specimen: BLOOD  Result Value Ref Range Status   Specimen Description BLOOD BLOOD RIGHT HAND  Final   Special Requests   Final    BOTTLES DRAWN AEROBIC AND ANAEROBIC Blood Culture adequate volume   Culture   Final    NO GROWTH 2 DAYS Performed at [facility], [address] [place], [place] [address]    Report Status PENDING  Incomplete  CULTURE, BLOOD (ROUTINE X 2) w Reflex to ID Panel     Status: None (Preliminary result)   Collection Time: 11/14/20 12:14 AM   Specimen: BLOOD  Result Value Ref Range Status   Specimen Description BLOOD BLOOD LEFT HAND  Final   Special Requests   Final    BOTTLES DRAWN AEROBIC AND ANAEROBIC Blood Culture results may not be optimal due to an excessive volume of blood received in culture bottles   Culture   Final    NO GROWTH 2 DAYS Performed at [facility], [address] [place], [place] [address]    Report Status PENDING  Incomplete  Culture, blood (Routine X 2) w Reflex to ID Panel     Status: None (Preliminary result)   Collection Time: 11/15/20  4:17 AM   Specimen: BLOOD  Result Value Ref Range Status   Specimen Description BLOOD BLOOD RIGHT HAND  Final   Special Requests   Final    BOTTLES DRAWN AEROBIC AND ANAEROBIC Blood Culture  adequate volume   Culture  Setup Time   Final    ANAEROBIC BOTTLE ONLY GRAM POSITIVE COCCI CRITICAL VALUE NOTED.  VALUE IS CONSISTENT WITH PREVIOUSLY REPORTED AND CALLED VALUE. Performed at [facility], [address] [place], [place] [address]    Culture [facility] POSITIVE COCCI  Final   Report Status PENDING  Incomplete  Culture, blood (Routine X 2) w Reflex to ID Panel     Status: None (Preliminary result)   Collection Time: 11/15/20  4:18 AM   Specimen: BLOOD  Result Value Ref Range Status   Specimen Description BLOOD BLOOD LEFT HAND  Final   Special Requests   Final    BOTTLES DRAWN AEROBIC AND ANAEROBIC Blood Culture adequate volume   Culture  Setup Time   Final    Organism ID to follow ANAEROBIC BOTTLE ONLY GRAM POSITIVE COCCI CRITICAL RESULT CALLED TO, READ BACK BY AND VERIFIED WITHOneita Kras [facility] AT 2235 11/15/20 MF Performed at [facility], [address] [place],  [address]    Culture [facility] POSITIVE COCCI  Final   Report Status PENDING  Incomplete  Blood Culture ID Panel (Reflexed)     Status: Abnormal   Collection Time: 11/15/20  4:18 AM  Result Value Ref Range Status   Enterococcus faecalis DETECTED (A) NOT DETECTED Final    Comment: CRITICAL RESULT CALLED TO, READ BACK BY AND VERIFIED WITH: [facility] AT 2235 11/15/20 MF.    Enterococcus Faecium NOT DETECTED NOT DETECTED Final   Listeria monocytogenes NOT DETECTED NOT DETECTED Final   Staphylococcus species NOT DETECTED NOT DETECTED Final   Staphylococcus aureus (BCID) NOT DETECTED NOT DETECTED Final   Staphylococcus epidermidis NOT DETECTED NOT DETECTED Final   Staphylococcus lugdunensis NOT DETECTED NOT DETECTED Final   Streptococcus species NOT DETECTED NOT DETECTED Final   Streptococcus agalactiae NOT DETECTED NOT DETECTED Final   Streptococcus pneumoniae NOT DETECTED NOT DETECTED Final   Streptococcus pyogenes NOT DETECTED NOT DETECTED Final   A.calcoaceticus-baumannii NOT  DETECTED NOT DETECTED Final   Bacteroides fragilis NOT DETECTED NOT DETECTED Final   Enterobacterales NOT DETECTED NOT DETECTED Final   Enterobacter cloacae complex NOT DETECTED NOT DETECTED Final   Escherichia coli NOT DETECTED NOT DETECTED Final   Klebsiella aerogenes NOT DETECTED NOT DETECTED Final   Klebsiella oxytoca NOT DETECTED NOT DETECTED Final   Klebsiella pneumoniae NOT DETECTED NOT DETECTED Final   Proteus species NOT DETECTED NOT DETECTED Final   Salmonella species NOT DETECTED NOT DETECTED Final   Serratia marcescens NOT DETECTED NOT DETECTED Final   Haemophilus influenzae NOT DETECTED NOT DETECTED Final   Neisseria meningitidis NOT DETECTED NOT DETECTED Final   Pseudomonas aeruginosa NOT DETECTED NOT DETECTED Final   Stenotrophomonas maltophilia NOT DETECTED NOT DETECTED Final   Candida albicans NOT DETECTED NOT DETECTED Final   Candida auris NOT DETECTED NOT DETECTED Final   Candida glabrata NOT DETECTED NOT DETECTED Final   Candida krusei NOT DETECTED NOT DETECTED Final   Candida parapsilosis NOT DETECTED NOT  DETECTED Final   Candida tropicalis NOT DETECTED NOT DETECTED Final   Cryptococcus neoformans/gattii NOT DETECTED NOT DETECTED Final   Vancomycin resistance NOT DETECTED NOT DETECTED Final    Comment: Performed at [facility], [address] [place], [place] [address]         Radiology Studies: ECHO TEE  Result Date: 11/15/2020    TRANSESOPHOGEAL ECHO REPORT   Patient Name:   UDAY JANTZ Date of Exam: 11/14/2020 Medical Rec #:  [id number]     Height:       70.0 in Accession #:    [id number]    Weight:       180.8 lb Date of Birth:  June 08, 1951    BSA:          2.000 m Patient Age:    62 years      BP:           112/65 mmHg Patient Gender: M             HR:           95 bpm. Exam Location:  [facility] Procedure: Transesophageal Echo, Color Doppler and Cardiac Doppler Indications:     Endocarditis  History:         Patient has prior history of Echocardiogram  examinations, most                  recent 11/12/2020. Risk Factors:Hypertension, Diabetes and                  Dyslipidemia.  Sonographer:     Charmayne Sheer RDCS (AE) Referring Phys:  [place] Diagnosing Phys: Serafina Royals MD PROCEDURE: The transesophogeal probe was passed without difficulty through the esophogus of the patient. Sedation performed by performing physician. The patient developed no complications during the procedure. IMPRESSIONS  1. Left ventricular ejection fraction, by estimation, is 50 to 55%. The left ventricle has low normal function.  2. Right ventricular systolic function is normal. The right ventricular size is normal.  3. Left atrial size was mildly dilated. No left atrial/left atrial appendage thrombus was detected.  4. Right atrial size was mildly dilated.  5. The mitral valve is normal in structure. Mild mitral valve regurgitation.  6. The aortic valve is normal in structure. Aortic valve regurgitation is trivial.  7. There is mild (Grade II) plaque.  8. Agitated saline contrast bubble study was negative, with no evidence of any interatrial shunt. FINDINGS  Left Ventricle: Left ventricular ejection fraction, by estimation, is 50 to 55%. The left ventricle has low normal function. The left ventricular internal cavity size was normal in size. Right Ventricle: The right ventricular size is normal. No increase in right ventricular wall thickness. Right ventricular systolic function is normal. Left Atrium: Left atrial size was mildly dilated. No left atrial/left atrial appendage thrombus was detected. Right Atrium: Right atrial size was mildly dilated. Prominent Eustachian valve. Pericardium: There is no evidence of pericardial effusion. Mitral Valve: The mitral valve is normal in structure. Mild mitral valve regurgitation. There is no evidence of mitral valve vegetation. Tricuspid Valve: The tricuspid valve is normal in structure. Tricuspid valve regurgitation is trivial. There  is no evidence of tricuspid valve vegetation. Aortic Valve: The aortic valve is normal in structure. Aortic valve regurgitation is trivial. There is no evidence of aortic valve vegetation. Pulmonic Valve: The pulmonic valve was normal in structure. Pulmonic valve regurgitation is trivial. There is no evidence of pulmonic valve vegetation.  Aorta: The aortic root and ascending aorta are structurally normal, with no evidence of dilitation. There is mild (Grade II) plaque. IAS/Shunts: No atrial level shunt detected by color flow Doppler. Agitated saline contrast was given intravenously to evaluate for intracardiac shunting. Agitated saline contrast bubble study was negative, with no evidence of any interatrial shunt. There  is no evidence of a patent foramen ovale. No ventricular septal defect is seen or detected. There is no evidence of an atrial septal defect. Serafina Royals MD Electronically signed by Serafina Royals MD Signature Date/Time: 11/15/2020/7:50:19 AM    Final         Scheduled Meds: . aspirin EC  81 mg Oral Daily  . clopidogrel  75 mg Oral Daily  . collagenase   Topical Daily  . enoxaparin (LOVENOX) injection  40 mg Subcutaneous Q24H  . fluticasone  2 spray Each Nare Daily  . gabapentin  300 mg Oral TID  . insulin aspart  0-15 Units Subcutaneous TID WC  . insulin aspart  0-5 Units Subcutaneous QHS  . mometasone-formoterol  2 puff Inhalation BID  . oxybutynin  5 mg Oral BID  . pantoprazole  40 mg Oral Daily   Or  . pantoprazole (PROTONIX) IV  40 mg Intravenous Daily  . polyethylene glycol  17 g Oral Daily  . pravastatin  10 mg Oral q1800   Continuous Infusions: . sodium chloride Stopped (11/13/20 1206)  . DAPTOmycin (CUBICIN)  IV 650 mg (11/15/20 2038)     LOS: 5 days   Time spent= 35 mins    Tametria Aho Arsenio Loader, MD [facility]  If 7PM-7AM, please contact night-coverage  11/16/2020, 11:37 AM

## 2020-11-17 ENCOUNTER — Encounter
Admission: EM | Disposition: A | Discharge status: Home Health | Payer: Self-pay | Source: Home / Self Care | Attending: Internal Medicine

## 2020-11-17 ENCOUNTER — Encounter: Payer: Self-pay | Admitting: Vascular Surgery

## 2020-11-17 DIAGNOSIS — R7881 Bacteremia: Secondary | ICD-10-CM | POA: Diagnosis not present

## 2020-11-17 DIAGNOSIS — B952 Enterococcus as the cause of diseases classified elsewhere: Secondary | ICD-10-CM | POA: Diagnosis not present

## 2020-11-17 DIAGNOSIS — E11628 Type 2 diabetes mellitus with other skin complications: Secondary | ICD-10-CM | POA: Diagnosis not present

## 2020-11-17 DIAGNOSIS — B9561 Methicillin susceptible Staphylococcus aureus infection as the cause of diseases classified elsewhere: Secondary | ICD-10-CM | POA: Diagnosis not present

## 2020-11-17 DIAGNOSIS — I10 Essential (primary) hypertension: Secondary | ICD-10-CM | POA: Diagnosis not present

## 2020-11-17 DIAGNOSIS — K219 Gastro-esophageal reflux disease without esophagitis: Secondary | ICD-10-CM | POA: Diagnosis not present

## 2020-11-17 DIAGNOSIS — E1165 Type 2 diabetes mellitus with hyperglycemia: Secondary | ICD-10-CM | POA: Diagnosis not present

## 2020-11-17 DIAGNOSIS — I70245 Atherosclerosis of native arteries of left leg with ulceration of other part of foot: Secondary | ICD-10-CM

## 2020-11-17 DIAGNOSIS — I428 Other cardiomyopathies: Secondary | ICD-10-CM | POA: Diagnosis not present

## 2020-11-17 DIAGNOSIS — I739 Peripheral vascular disease, unspecified: Secondary | ICD-10-CM

## 2020-11-17 HISTORY — PX: LOWER EXTREMITY ANGIOGRAPHY: CATH118251

## 2020-11-17 LAB — BLOOD CULTURE ID PANEL (REFLEXED) - BCID2

## 2020-11-17 LAB — CBC
HCT: 31.6 % — ABNORMAL LOW (ref 39.0–52.0)
Hemoglobin: 10.3 g/dL — ABNORMAL LOW (ref 13.0–17.0)
MCH: 28.3 pg (ref 26.0–34.0)
MCHC: 32.6 g/dL (ref 30.0–36.0)
MCV: 86.8 fL (ref 80.0–100.0)
Platelets: 179 10*3/uL (ref 150–400)
RBC: 3.64 MIL/uL — ABNORMAL LOW (ref 4.22–5.81)
RDW: 13.8 % (ref 11.5–15.5)
WBC: 4.8 10*3/uL (ref 4.0–10.5)
nRBC: 0 % (ref 0.0–0.2)

## 2020-11-17 LAB — GLUCOSE, CAPILLARY
Glucose-Capillary: 163 mg/dL — ABNORMAL HIGH (ref 70–99)
Glucose-Capillary: 139 mg/dL — ABNORMAL HIGH (ref 70–99)
Glucose-Capillary: 124 mg/dL — ABNORMAL HIGH (ref 70–99)
Glucose-Capillary: 117 mg/dL — ABNORMAL HIGH (ref 70–99)
Glucose-Capillary: 109 mg/dL — ABNORMAL HIGH (ref 70–99)

## 2020-11-17 LAB — MAGNESIUM: Magnesium: 2 mg/dL (ref 1.7–2.4)

## 2020-11-17 LAB — BASIC METABOLIC PANEL
Anion gap: 8 (ref 5–15)
BUN: 9 mg/dL (ref 8–23)
CO2: 25 mmol/L (ref 22–32)
Calcium: 8.6 mg/dL — ABNORMAL LOW (ref 8.9–10.3)
Chloride: 98 mmol/L (ref 98–111)
Creatinine, Ser: 0.85 mg/dL (ref 0.61–1.24)
GFR, Estimated: >60 mL/min (ref >60)
Glucose, Bld: 159 mg/dL — ABNORMAL HIGH (ref 70–99)
Potassium: 4.1 mmol/L (ref 3.5–5.1)
Sodium: 131 mmol/L — ABNORMAL LOW (ref 135–145)

## 2020-11-17 SURGERY — LOWER EXTREMITY ANGIOGRAPHY
Anesthesia: Moderate Sedation | Laterality: Left

## 2020-11-17 MED ORDER — FENTANYL CITRATE (PF) 100 MCG/2ML IJ SOLN
INTRAMUSCULAR | Status: DC | PRN
  Administered 2020-11-17: 50 ug via INTRAVENOUS

## 2020-11-17 MED ORDER — CLINDAMYCIN PHOSPHATE 300 MG/50ML IV SOLN
INTRAVENOUS | Status: AC
  Filled 2020-11-17: qty 50

## 2020-11-17 MED ORDER — FENTANYL CITRATE (PF) 100 MCG/2ML IJ SOLN
INTRAMUSCULAR | Status: AC
  Administered 2020-11-17: 25 ug
  Filled 2020-11-17: qty 2

## 2020-11-17 MED ORDER — VANCOMYCIN HCL 2000 MG/400ML IV SOLN
2000.0000 mg | Freq: Once | INTRAVENOUS | Status: AC
  Administered 2020-11-17: 2000 mg via INTRAVENOUS
  Filled 2020-11-17: qty 400

## 2020-11-17 MED ORDER — VANCOMYCIN HCL 1250 MG/250ML IV SOLN
1250.0000 mg | Freq: Two times a day (BID) | INTRAVENOUS | Status: AC
  Administered 2020-11-18 – 2020-11-19 (×4): 1250 mg via INTRAVENOUS
  Filled 2020-11-17 (×5): qty 250

## 2020-11-17 MED ORDER — IODIXANOL 320 MG/ML IV SOLN
INTRAVENOUS | Status: DC | PRN
  Administered 2020-11-17: 65 mL

## 2020-11-17 MED ORDER — HEPARIN SODIUM (PORCINE) 1000 UNIT/ML IJ SOLN
INTRAMUSCULAR | Status: AC
  Administered 2020-11-17: 4000 [IU]
  Filled 2020-11-17: qty 1

## 2020-11-17 MED ORDER — MIDAZOLAM HCL 5 MG/5ML IJ SOLN
INTRAMUSCULAR | Status: AC
  Administered 2020-11-17: 1 mg
  Filled 2020-11-17: qty 5

## 2020-11-17 MED ORDER — ACETAMINOPHEN 325 MG PO TABS: 650.0000 mg | ORAL_TABLET | Freq: Four times a day (QID) | ORAL | Status: DC | PRN

## 2020-11-17 MED ORDER — OXYCODONE HCL 5 MG PO TABS
ORAL_TABLET | ORAL | Status: AC
  Administered 2020-11-17: 5 mg via ORAL
  Filled 2020-11-17: qty 1

## 2020-11-17 SURGICAL SUPPLY — 17 items
BALLN ULTRVRSE 018 2.5X100X150 (BALLOONS) ×2
BALLN ULTRVRSE 3X100X150 (BALLOONS) ×2
BALLOON ULTRVRSE 3X100X150 (BALLOONS) ×1 IMPLANT
BALLOON ULTRVS 018 2.5X100X150 (BALLOONS) ×1 IMPLANT
CATH ANGIO 5F PIGTAIL 65CM (CATHETERS) ×2 IMPLANT
CATH BERNSTEIN 5FR 130CM (CATHETERS) ×2 IMPLANT
COVER PROBE U/S 5X48 (MISCELLANEOUS) ×2 IMPLANT
DEVICE STARCLOSE SE CLOSURE (Vascular Products) ×2 IMPLANT
GLIDEWIRE ADV .035X260CM (WIRE) ×2 IMPLANT
KIT ENCORE 26 ADVANTAGE (KITS) ×2 IMPLANT
PACK ANGIOGRAPHY (CUSTOM PROCEDURE TRAY) ×2 IMPLANT
SHEATH BRITE TIP 5FRX11 (SHEATH) ×2 IMPLANT
SHEATH RAABE 6FRX70 (SHEATH) ×2 IMPLANT
SYR MEDRAD MARK 7 150ML (SYRINGE) ×2 IMPLANT
TUBING CONTRAST HIGH PRESS 72 (TUBING) ×2 IMPLANT
WIRE G V18X300CM (WIRE) ×2 IMPLANT
WIRE GUIDERIGHT .035X150 (WIRE) ×2 IMPLANT

## 2020-11-17 NOTE — Care Management Important Message (Signed)
Important Message  Patient Details  Name: [name] MRN: [id number] Date of Birth: Feb 07, 1951   Medicare Important Message Given:  Yes     Dannette Barbara 11/17/2020, 11:31 AM

## 2020-11-17 NOTE — Op Note (Signed)
[facility]  Percutaneous Study/Intervention Procedural Note   Date of Surgery: 11/17/2020  Surgeon(s):Ladawna Walgren    Assistants:none  Pre-operative Diagnosis: PAD with ulceration and infection LLE  Post-operative diagnosis:  Same  Procedure(s) Performed:             1.  Ultrasound guidance for vascular access right femoral artery             2.  Catheter placement into left anterior tibial and left tibioperoneal trunk from right femoral approach             3.  Aortogram and selective left lower extremity angiogram including selective images of the anterior tibial artery and the tibioperoneal trunk/peroneal artery.             4.  Percutaneous transluminal angioplasty of left peroneal artery with 2.5 and 3 mm diameter angioplasty balloons             5.  StarClose closure device right femoral artery  EBL: 5 cc  Contrast: 65 cc  Fluoro Time: 3.9 minutes  Moderate Conscious Sedation Time: approximately 38 minutes using 1 mg of Versed and 25 mcg of Fentanyl              Indications:  Patient is a 70 y.o.male with a long history of PAD and an infected ulceration on the left foot. The patient is brought in for angiography for further evaluation and potential treatment.  Due to the limb threatening nature of the situation, angiogram was performed for attempted limb salvage. The patient is aware that if the procedure fails, amputation would be expected.  The patient also understands that even with successful revascularization, amputation may still be required due to the severity of the situation.  Risks and benefits are discussed and informed consent is obtained.   Procedure:  The patient was identified and appropriate procedural time out was performed.  The patient was then placed supine on the table and prepped and draped in the usual sterile fashion. Moderate conscious sedation was administered during a face to face encounter with the patient throughout the procedure  with my supervision of the RN administering medicines and monitoring the patient's vital signs, pulse oximetry, telemetry and mental status throughout from the start of the procedure until the patient was taken to the recovery room. Ultrasound was used to evaluate the right common femoral artery.  It was patent but calcific and diseased.  A digital ultrasound image was acquired.  A Seldinger needle was used to access the right common femoral artery under direct ultrasound guidance and a permanent image was performed.  A 0.035 J wire was advanced without resistance and a 5Fr sheath was placed.  Pigtail catheter was placed into the aorta and an AP aortogram was performed. This demonstrated normal renal arteries and normal aorta and iliac segments without significant stenosis. I then crossed the aortic bifurcation and advanced to the left femoral head. Selective left lower extremity angiogram was then performed. This demonstrated mild common femoral artery and profunda femoris artery disease that was nonflow limiting.  The SFA and popliteal stents that have been previously placed were all widely patent without significant residual stenosis.  The tibial vessels were not particular well-opacified so I advanced a long Kumpe catheter down first in the anterior tibial artery and performed imaging which showed this to be patent without focal stenosis into the foot.  It was then placed in the tibioperoneal trunk which showed the posterior tibial artery to  be chronically occluded without distal reconstitution.  The peroneal artery was a decent second runoff vessel but did have a focal moderate stenosis in the midsegment in the 60 to 70% range. It was felt that it was in the patient's best interest to proceed with intervention after these images to avoid a second procedure and a larger amount of contrast and fluoroscopy based off of the findings from the initial angiogram. The patient was systemically heparinized and a 6 French  70 cm sheath was then placed over the Terumo Advantage wire. I then used a Kumpe catheter and the advantage wire to get down into the TP trunk and then the proximal peroneal artery and exchange for a V18 wire.  I then crossed the stenosis in the mid peroneal artery with a V 18 wire.  A 3 mm diameter by 10 cm length angioplasty balloon was inflated to 8 atm for 1 minute.  Following this there was still some narrowing and some wall irregularity and was not clear if there may be a small dissection there.  Either way, there was residual stenosis that needed addressed and so I selected a 2.5 mm diameter by 10 cm length angioplasty balloon and inflated this to 10 atm for 2 minutes.  Following this, but markedly improved with a less than 20% residual stenosis and brisk flow distally. I elected to terminate the procedure. The sheath was removed and StarClose closure device was deployed in the right femoral artery with excellent hemostatic result. The patient was taken to the recovery room in stable condition having tolerated the procedure well.  Findings:               Aortogram:  Normal renal arteries, normal aorta and iliac arteries without obvious stenosis.             Left lower Extremity:  Mild common femoral artery and profunda femoris artery disease that was nonflow limiting.  The SFA and popliteal stents that have been previously placed were all widely patent without significant residual stenosis.  The tibial vessels were not particular well-opacified so advanced a long Kumpe catheter down first in the anterior tibial artery and performed imaging which showed this to be patent without focal stenosis into the foot.  It was then placed in the tibioperoneal trunk which showed the posterior tibial artery to be chronically occluded without distal reconstitution.  The peroneal artery was a decent second runoff vessel but did have a focal moderate stenosis in the midsegment in the 60 to 70% range.   Disposition:  Patient was taken to the recovery room in stable condition having tolerated the procedure well.  Complications: None  [name] 11/17/2020 2:27 PM   This note was created with Dragon Medical transcription system. Any errors in dictation are purely unintentional.

## 2020-11-17 NOTE — Progress Notes (Signed)
Pharmacy Antibiotic Note  [name] is a 70 y.o. male admitted on 11/11/2020 with bacteremia. Pt presented with L foot wounds, s/p debridement on 2/16. Pt has a hx of toe amputations. Pt has an allergy to penicillins with reaction of severe mouth ulcers occurring with both penicillin and ampicillin. 2/15 blood cultures positive for enterococcus faecalis and MSSA. 2/16 wound culture with MSSA, e. Faecalis, and prevotella bivia. Repeat blood cultures from 2/16, 2/19, and 2/20 with e. faecalis. TEE on 2/18 negative for vegetation. No osteomyelitis or septic arthritis per MRI. Pt planned for LLE angiogram when infection is under control, may undergo 2/21. Awaiting PICC placement for IV abx x 6 weeks. Pharmacy has been consulted for daptomycin dosing.  Baseline CK 26  Day 6 abx  Plan: Continue daptomycin 650 mg daily x 6 weeks Monitor CK weekly (due 2/25) F/u repeat BCx susc.   Height: 5\' 10"  (177.8 cm) Weight: 82 kg (180 lb 12.4 oz) IBW/kg (Calculated) : 73  Temp (24hrs), Avg:98.1 F (36.7 C), Min:97.7 F (36.5 C), Max:98.4 F (36.9 C)  Recent Labs  Lab 11/11/20 1513 11/11/20 1638 11/11/20 1845 11/12/20 0615 11/13/20 0636 11/14/20 0014 11/15/20 0417 11/16/20 0523 11/17/20 0458  WBC 8.2  --   --    < > 5.2 4.6 4.9 4.7 4.8  CREATININE 1.05  --   --    < > 0.97 0.94 0.85 0.83 0.85  LATICACIDVEN 1.1 2.1* 1.2  --   --   --   --   --   --    < > = values in this interval not displayed.    Estimated Creatinine Clearance: 87.1 mL/min (by C-G formula based on SCr of 0.85 mg/dL).    Allergies  Allergen Reactions  . Penicillins Rash and Other (See Comments)    Rash in and around the mouth Has patient had a PCN reaction causing immediate rash, facial/tongue/throat swelling, SOB or lightheadedness with hypotension: Yes Has patient had a PCN reaction causing severe rash involving mucus membranes or skin necrosis: Yes Has patient had a PCN reaction that required hospitalization: No Has  patient had a PCN reaction occurring within the last 10 years: Yes If all of the above answers are "NO", then may proceed with Cephalosporin use.   . Bactrim [Sulfamethoxazole-Trimethoprim] Other (See Comments)    Mouth sores, Sores develop in mouth    Antimicrobials this admission: 2/15 cefepime >> 2/16 2/15 metronidazole x 1  2/15 vancomycin >> 2/17 2/17 daptomycin >>  Microbiology results: 2/15 BCx: e. Faecalis, staph aureus 2/16 BCx: e. faecalis 2/16 WCx: staph aureus, e. Faecalis, Prevotella bivia 2/18 BCx: NGTD 2/19 BCx: e. Faecalis 2/20 BCx: e. faecalis 2/21 Bcx: pending  Thank you for allowing pharmacy to be a part of this patient's care.  Benn Moulder, PharmD Pharmacy Resident  11/17/2020 10:51 AM

## 2020-11-17 NOTE — TOC Initial Note (Signed)
Transition of Care [facility]) - Initial/Assessment Note    Patient Details  Name: [name] MRN: [id number] Date of Birth: 10-30-1950  Transition of Care [facility] CM/SW Contact:    Beverly Sessions, RN Phone Number: 11/17/2020, 10:05 AM  Clinical Narrative:                 Patient admitted from home with sepsis Patient states he lives at home with his girlfriend.  She is bedbound and her son and daughter come to the home to provide care forher  PCP [name]  Denies issues with transportation of obtaining medications  Patient has RW, rollator, BSC, cane, crutches, and power WC in the home  PT has assessed patient and recommends home health PT  Anticipated that patient will also require RN for wound care and IV antibiotics.  Patient in agreement.  States he does not have a preference of home health agency   Referral given to [place] with Company secretary for RN, PT, OT and aide Referral for infusion [facility] to [name] with [facility]  Expected Discharge Plan: [place] Barriers to Discharge: Continued Medical Work up   Patient Goals and CMS Choice     Choice offered to / list presented to : Patient  Expected Discharge Plan and Services Expected Discharge Plan: [place]   Discharge Planning Services: CM Consult Post Acute Care Choice: [place] arrangements for the past 2 months: Single Family Home                           HH Arranged: RN,PT,OT,Nurse's Aide HH Agency: [facility] Date [facility] Agency Contacted: 11/17/20      Prior Living Arrangements/Services Living arrangements for the past 2 months: Single Family Home Lives with:: Significant Other,Other (Comment)              Current home services: DME    Activities of Daily Living Home Assistive Devices/Equipment: Eyeglasses ADL Screening (condition at time of admission) Patient's cognitive ability adequate to safely complete daily activities?: Yes Is the patient  deaf or have difficulty hearing?: No Does the patient have difficulty seeing, even when wearing glasses/contacts?: No Does the patient have difficulty concentrating, remembering, or making decisions?: No Patient able to express need for assistance with ADLs?: No Does the patient have difficulty dressing or bathing?: No Independently performs ADLs?: Yes (appropriate for developmental age) Does the patient have difficulty walking or climbing stairs?: Yes Weakness of Legs: Left Weakness of Arms/Hands: None  Permission Sought/Granted                  Emotional Assessment       Orientation: : Oriented to Self,Oriented to Place,Oriented to  Time,Oriented to Situation Alcohol / Substance Use: Not Applicable Psych Involvement: No (comment)  Admission diagnosis:  Diabetic foot infection ([place]) [X83.382, L08.9] Sepsis ([place]) [A41.9] Acute on chronic congestive heart failure, unspecified heart failure type ([place]) [I50.9] Patient Active Problem List   Diagnosis Date Noted  . Sepsis ([place]) 11/11/2020  . Diabetic foot ulcer ([place]) 11/11/2020  . Moderate asthma without complication 50/53/9767  . Bilateral impacted cerumen 08/09/2020  . Acute otitis externa of both ears 08/09/2020  . Primary generalized (osteo)arthritis 03/20/2020  . Dysuria 06/15/2019  . Atherosclerosis of artery of extremity with ulceration ([place]) 11/30/2018  . Need for vaccination against Streptococcus pneumoniae using pneumococcal conjugate vaccine 13 10/26/2018  . Overactive bladder 10/26/2018  . Atherosclerosis  of native arteries of the extremities with gangrene ([place]) 10/25/2018  . Sore throat 04/03/2018  . Oral candidiasis 04/03/2018  . Uncontrolled type 2 diabetes mellitus with hyperglycemia ([place]) 03/06/2018  . Acute non-recurrent pansinusitis 03/06/2018  . Primary osteoarthritis of shoulders, bilateral 03/06/2018  . GERD (gastroesophageal reflux disease) 11/03/2017  . Carotid stenosis, right 05/12/2017  .  Encounter for general adult medical examination with abnormal findings 03/25/2017  . PAD (peripheral artery disease) ([place]) 03/15/2017  . Bilateral carotid artery stenosis 03/15/2017  . Cardiomyopathy, nonischemic ([place]) 07/28/2015  . Chest pain with low risk for cardiac etiology 12/04/2014  . Paroxysmal SVT (supraventricular tachycardia) ([place]) 06/13/2014  . Diabetes ([place]) 06/13/2014  . Mixed hyperlipidemia 06/13/2014  . Essential hypertension 06/13/2014  . Arthritis 06/13/2014  . H/O seasonal allergies 06/13/2014   PCP:  [name], MD Pharmacy:   [facility], [address] [place] [address] Phone: [phone] Fax: [phone]     Social Determinants of Health (SDOH) Interventions    Readmission Risk Interventions No flowsheet data found.

## 2020-11-17 NOTE — Progress Notes (Signed)
Patient transported to vascular lab.

## 2020-11-17 NOTE — Progress Notes (Signed)
PHARMACY - PHYSICIAN COMMUNICATION CRITICAL VALUE ALERT - BLOOD CULTURE IDENTIFICATION (BCID)  [name] is an 70 y.o. male who presented to [facility] on 11/11/2020 with a chief complaint of sepsis  Assessment:  E faecalis in 1 of 4 bottles (aerobic) , no resistance detected  (include suspected source if known)  Name of physician (or Provider) Contacted: Jonny Ruiz, NP   Current antibiotics: Daptomycin 650 mg IV Q24H   Changes to prescribed antibiotics recommended:  Patient is on recommended antibiotics - No changes needed  Results for orders placed or performed during the hospital encounter of 11/11/20  Blood Culture ID Panel (Reflexed) (Collected: 11/16/2020  8:36 AM)  Result Value Ref Range   Enterococcus faecalis DETECTED (A) NOT DETECTED   Enterococcus Faecium NOT DETECTED NOT DETECTED   Listeria monocytogenes NOT DETECTED NOT DETECTED   Staphylococcus species NOT DETECTED NOT DETECTED   Staphylococcus aureus (BCID) NOT DETECTED NOT DETECTED   Staphylococcus epidermidis NOT DETECTED NOT DETECTED   Staphylococcus lugdunensis NOT DETECTED NOT DETECTED   Streptococcus species NOT DETECTED NOT DETECTED   Streptococcus agalactiae NOT DETECTED NOT DETECTED   Streptococcus pneumoniae NOT DETECTED NOT DETECTED   Streptococcus pyogenes NOT DETECTED NOT DETECTED   A.calcoaceticus-baumannii NOT DETECTED NOT DETECTED   Bacteroides fragilis NOT DETECTED NOT DETECTED   Enterobacterales NOT DETECTED NOT DETECTED   Enterobacter cloacae complex NOT DETECTED NOT DETECTED   Escherichia coli NOT DETECTED NOT DETECTED   Klebsiella aerogenes NOT DETECTED NOT DETECTED   Klebsiella oxytoca NOT DETECTED NOT DETECTED   Klebsiella pneumoniae NOT DETECTED NOT DETECTED   Proteus species NOT DETECTED NOT DETECTED   Salmonella species NOT DETECTED NOT DETECTED   Serratia marcescens NOT DETECTED NOT DETECTED   Haemophilus influenzae NOT DETECTED NOT DETECTED   Neisseria meningitidis NOT DETECTED NOT  DETECTED   Pseudomonas aeruginosa NOT DETECTED NOT DETECTED   Stenotrophomonas maltophilia NOT DETECTED NOT DETECTED   Candida albicans NOT DETECTED NOT DETECTED   Candida auris NOT DETECTED NOT DETECTED   Candida glabrata NOT DETECTED NOT DETECTED   Candida krusei NOT DETECTED NOT DETECTED   Candida parapsilosis NOT DETECTED NOT DETECTED   Candida tropicalis NOT DETECTED NOT DETECTED   Cryptococcus neoformans/gattii NOT DETECTED NOT DETECTED   Vancomycin resistance NOT DETECTED NOT DETECTED    [name] 11/17/2020  12:52 AM

## 2020-11-17 NOTE — Progress Notes (Signed)
Physical Therapy Treatment Patient Details Name: ARSHIA RONDON MRN: [id number] DOB: 08-Apr-1951 Today's Date: 11/17/2020    History of Present Illness Ansel Ferrall is a 70 y.o. male with medical history significant for hypertension, nonischemic cardiomyopathy, non-insulin-dependent DM, peripheral neuropathy, and PAD, who presents to the ED following a visit with his PCP for SOB and swelling and pain in L LE. He endorses weakness and shortness of breath with exertion and increased sleeping and poor PO intake. He reports this has been ongoing for the last 2 weeks. He further endorses nausea and diarrhea x 2 weeks, with worsening the in last 2 days prompting evaluation at PCP. Pt reports unchanged baseline dry cough. He denies chest pain, abdominal pain, dysuria, hematuria. He endorses left lower extremity edema that has been ongoing for the last 2 weeks and worsening in the last 2 days. At PCP, he was advised to present to the ED due to his left lower extremity swelling and shortness of breath. Pt reports he was aware of the diabetic foot ulcer on the medial great toe, however he was unaware of the wound on the left heel.    PT Comments    Patient is progressing with functional independence with activity, however continues to require cues to maintain weight bearing status with ambulation and transfers. Patient reports no pain with mobility. Standing activity tolerance and ambulation distance is limited by fatigue with activity. Sp02 89% on room air initially after walking, increasing to 94% with short rest break. Patient educated on energy conservation techniques and safety at home. Recommend to continue PT to maximize independence and address remaining functional limitations.     Follow Up Recommendations  Home health PT     Equipment Recommendations  Rolling walker with 5" wheels    Recommendations for Other Services       Precautions / Restrictions Precautions Precautions:  Fall Restrictions Weight Bearing Restrictions: Yes LLE Weight Bearing:  (touchdown weight bearing) Other Position/Activity Restrictions: no heel contact. needs surgical shoe (size 11 shoe)    Mobility  Bed Mobility Overal bed mobility: Modified Independent                  Transfers Overall transfer level: Needs assistance Equipment used: Rolling walker (2 wheeled) Transfers: Sit to/from Stand Sit to Stand: Supervision         General transfer comment: supervision for safety. verbal cues for LLE positioning with transfers to maintain weight bearing status.  Ambulation/Gait Ambulation/Gait assistance: Min guard;Supervision Gait Distance (Feet): 28 Feet Assistive device: Rolling walker (2 wheeled) Gait Pattern/deviations: Decreased step length - left;Decreased step length - right Gait velocity: decreased   General Gait Details: verbal cues and reminders to maintain weight bearing status. patient maintains no weight bearing through left heel with ambulation with occasional difficulty maintaining weight bearing status with fatigue. ambulation is stead with slow cadence. unable to ambulate further due to fatigue with activity. Sp02 89% initially after ambulating on room air and increased to 94% with short rest break. patient has a rollator at home, however encouraged patient to use rolling walker in order to maintain weight bearing status.   Stairs             Wheelchair Mobility    Modified Rankin (Stroke Patients Only)       Balance  Cognition Arousal/Alertness: Awake/alert Behavior During Therapy: WFL for tasks assessed/performed Overall Cognitive Status: Within Functional Limits for tasks assessed                                        Exercises      General Comments        Pertinent Vitals/Pain Pain Assessment: No/denies pain    Home Living                       Prior Function            PT Goals (current goals can now be found in the care plan section) Acute Rehab PT Goals Patient Stated Goal: to go home PT Goal Formulation: With patient Time For Goal Achievement: 11/27/20 Potential to Achieve Goals: Good Progress towards PT goals: Progressing toward goals    Frequency    Min 2X/week      PT Plan Current plan remains appropriate    Co-evaluation              AM-PAC PT "6 Clicks" Mobility   Outcome Measure  Help needed turning from your back to your side while in a flat bed without using bedrails?: None Help needed moving from lying on your back to sitting on the side of a flat bed without using bedrails?: None Help needed moving to and from a bed to a chair (including a wheelchair)?: A Little Help needed standing up from a chair using your arms (e.g., wheelchair or bedside chair)?: A Little Help needed to walk in hospital room?: A Little Help needed climbing 3-5 steps with a railing? : A Lot 6 Click Score: 19    End of Session   Activity Tolerance: Patient tolerated treatment well Patient left: in bed;with call bell/phone within reach Nurse Communication: Mobility status PT Visit Diagnosis: Unsteadiness on feet (R26.81);Difficulty in walking, not elsewhere classified (R26.2)     Time: 1937-9024 PT Time Calculation (min) (ACUTE ONLY): 23 min  Charges:  $Gait Training: 8-22 mins $Therapeutic Activity: 8-22 mins                     Minna Merritts, PT, MPT    Percell Locus 11/17/2020, 12:23 PM

## 2020-11-17 NOTE — Progress Notes (Signed)
Pharmacy Antibiotic Note  [name] is a 70 y.o. male admitted on 11/11/2020 with bacteremia. Pt presented with L foot wounds, s/p debridement on 2/16. Pt has a hx of toe amputations. Pt has an allergy to penicillins with reaction of severe mouth ulcers occurring with both penicillin and ampicillin. 2/15 blood cultures positive for enterococcus faecalis and MSSA. 2/16 wound culture with MSSA, e. Faecalis, and prevotella bivia. Repeat blood cultures from 2/16, 2/19, and 2/20 with e. faecalis. TEE on 2/18 negative for vegetation. No osteomyelitis or septic arthritis per MRI. Pt planned for LLE angiogram when infection is under control, may undergo 2/21. Awaiting PICC placement for IV abx x 6 weeks. Pharmacy has been consulted for daptomycin and vancomycin dosing.   Baseline CK 26  Day 6 abx  Plan: Continue daptomycin 650 mg daily Monitor CK weekly (due 2/25)  Vancomycin 2000 mg IV LD x 1 followed by maintenance regimen of vancomycin 1250 mg IV q12h --Predicted AUC: 552, Cmin: 15.7 --Levels at steady state as indicated --Daily Scr per protocol while on vancomycin  Follow-up cultures. Follow-up antibiotic plan per ID: per consult instructions daptomycin to be continued until patient is therapeutic on vancomycin  Height: 5\' 10"  (177.8 cm) Weight: 82 kg (180 lb 12.4 oz) IBW/kg (Calculated) : 73  Temp (24hrs), Avg:98.1 F (36.7 C), Min:97.7 F (36.5 C), Max:98.4 F (36.9 C)  Recent Labs  Lab 11/11/20 1513 11/11/20 1638 11/11/20 1845 11/12/20 0615 11/13/20 0636 11/14/20 0014 11/15/20 0417 11/16/20 0523 11/17/20 0458  WBC 8.2  --   --    < > 5.2 4.6 4.9 4.7 4.8  CREATININE 1.05  --   --    < > 0.97 0.94 0.85 0.83 0.85  LATICACIDVEN 1.1 2.1* 1.2  --   --   --   --   --   --    < > = values in this interval not displayed.    Estimated Creatinine Clearance: 87.1 mL/min (by C-G formula based on SCr of 0.85 mg/dL).    Allergies  Allergen Reactions  . Penicillins Rash and Other  (See Comments)    Rash in and around the mouth Has patient had a PCN reaction causing immediate rash, facial/tongue/throat swelling, SOB or lightheadedness with hypotension: Yes Has patient had a PCN reaction causing severe rash involving mucus membranes or skin necrosis: Yes Has patient had a PCN reaction that required hospitalization: No Has patient had a PCN reaction occurring within the last 10 years: Yes If all of the above answers are "NO", then may proceed with Cephalosporin use.   . Bactrim [Sulfamethoxazole-Trimethoprim] Other (See Comments)    Mouth sores, Sores develop in mouth    Antimicrobials this admission: 2/15 Cefepime >> 2/16 2/15 Metronidazole x 1  2/15 Vancomycin >> 2/17, 2/21 >> 2/17 Daptomycin >>  Microbiology results: 2/15 BCx: E faecalis, MSSA 2/16 BCx: E faecalis 2/16 WCx: MSSA, E faecalis, Prevotella bivia 2/18 BCx: NGTD 2/19 BCx: E faecalis 2/20 BCx: E faecalis 2/21 Bcx: pending  Thank you for allowing pharmacy to be a part of this patient's care.  Benita Gutter 11/17/2020 7:04 PM

## 2020-11-17 NOTE — Progress Notes (Signed)
   Date of Admission:  11/11/2020      ID: [name] is a 70 y.o. male Principal Problem:   Sepsis ([place]) Active Problems:   PAD (peripheral artery disease) ([place])   Diabetes ([place])   Mixed hyperlipidemia   Essential hypertension   GERD (gastroesophageal reflux disease)   Uncontrolled type 2 diabetes mellitus with hyperglycemia ([facility])   Cardiomyopathy, nonischemic ([place])   Primary generalized (osteo)arthritis   Moderate asthma without complication   Diabetic foot ulcer ([place])    Subjective: Had angio today No fever  No pain spine No cough    Objective: Vital signs in last 24 hours: Temp:  [97.7 F (36.5 C)-98.4 F (36.9 C)] 98.3 F (36.8 C) (02/21 1205) Pulse Rate:  [45-101] 50 (02/21 1207) Resp:  [15-20] 15 (02/21 1303) BP: (98-128)/(68-79) 104/79 (02/21 1303) SpO2:  [87 %-99 %] 98 % (02/21 1303)  PHYSICAL EXAM:  General: Alert, cooperative, no distress,pale abnormality, atraumatic. Eyes: Conjunctivae clear, anicteric sclerae. Pupils are equal ENT Nares normal. No drainage or sinus tenderness. Lips, mucosa, and tongue normal. No Thrush Neck: Supple, symmetrical, no adenopathy, thyroid: non tender no carotid bruit and no JVD. Back: No CVA tenderness. Lungs: Clear to auscultation bilaterally. No Wheezing or Rhonchi. No rales. Heart: Regular rate and rhythm, no murmur, rub or gallop. Abdomen: Soft, t distended. Bowel sounds normal. No masses Extremities:left foot Heel unstageable ulcer covered with eschar       Skin: No rashes or lesions. Or bruising Lymph: Cervical, supraclavicular normal. Neurologic: Grossly non-focal  Lab Results Recent Labs    11/16/20 0523 11/17/20 0458  WBC 4.7 4.8  HGB 10.4* 10.3*  HCT 31.6* 31.6*  NA 131* 131*  K 4.0 4.1  CL 98 98  CO2 24 25  BUN 8 9  CREATININE 0.83 0.85    Microbiology: 11/11/2020 4 out of 4 blood culture positive for Enterococcus faecalis and staph aureus 11/12/2020 1 out of 4 bottle Enterococcus  faecalis 11/14/2020 blood culture no growth so far 11/15/2020 blood culture 3 out of 4 bottles  Enterococcus faecalis  11/16/2020 4 out of 4 bottles Enterococcus faecalis.  11/12/2020 wound culture has Enterococcus and staph aureus and Prevotella previa.    Assessment/Plan:  Enterococcus bacteremia Staph bacteremia Likely source is the left heel wound Patient is on daptomycin but is persistently positive for Enterococcus faecalis bacteremia. TEE negative for endocarditis He was initially on vancomycin it was switched to daptomycin on 11/13/2020. We will start vancomycin again.  Until he becomes therapeutic we will continue daptomycin as well We will discuss with podiatry as he may need surgical intervention for the left foot because of persistent bacteremia Would recommend repeat MRI of  the left foot. Would consider imaging the spine. Would consider getting a CT abdomen to look for any intra-abdominal source  Left foot wound secondary to diabetes mellitus, peripheral neuropathy, peripheral artery disease  Diabetes mellitus on insulin.  Anemia PAD: Underwent percutaneous transluminal angioplasty of left peroneal artery.  Discussed the management with the patient

## 2020-11-17 NOTE — Progress Notes (Signed)
PROGRESS NOTE    [name]  ZHY:[id number] DOB: 1951/01/19 DOA: 11/11/2020 PCP: [name], MD   Brief Narrative:  70 year old with history of essential hypertension, nonischemic cardiomyopathy, DM2, peripheral neuropathy, PAD, history of tobacco use quit 1998 presents with shortness of breath, poor oral intake nausea, diarrhea with left lower extremity swelling with worsening diabetic foot infection.  Patient has been fully vaccinated and boosted against COVID-19. Lower extremity Dopplers negative for DVT, CTA chest showed pleural effusion/atelectasis pleural effusion and atelectasis but no PE.  Blood cultures are positive for gram-positive cocci.  Surveillance cultures ordered.  Cardiology consulted for TEE which was negative.  Infectious disease recommended 6 weeks of IV daptomycin.  Has persistent gram-positive bacteremia, plans will be to place PICC line once bacteremia has cleared.  Assessment & Plan:   Principal Problem:   Sepsis ([place]) Active Problems:   PAD (peripheral artery disease) ([place])   Diabetes ([place])   Mixed hyperlipidemia   Essential hypertension   GERD (gastroesophageal reflux disease)   Uncontrolled type 2 diabetes mellitus with hyperglycemia (H[facility])   Cardiomyopathy, nonischemic ([place])   Primary generalized (osteo)arthritis   Moderate asthma without complication   Diabetic foot ulcer ([place])  Sepsis secondary to diabetic foot infection of his left lower extremity with ulceration and cellulitis nonpurulent MSSA/Enterococcus bacteremia, persistent bacteremia Left lower extremity leg pain -Sepsis physiology appears to be improving. -MRI foot-showing cellulitis and myositis but no evidence of osteomyelitis -Antibiotics-6 weeks of IV daptomycin per infectious disease. -Repeat cultures remain positive.  PICC line will be placed once is cleared -Podiatry-dressing changes, culture sent.  No surgical indication at this time -ID following -Vascular-plans for angiogram  on 2/18 with possible interventions -Lower extremity Dopplers-negative for DVT -Echocardiogram-EF 60 to 65% -TEE 2/18-negative for endocarditis  Peripheral arterial disease - Aspirin Plavix and statin -Vascular surgery planning on angiogram on Monday, may need to be postponed given bacteremia.  Defer to vascular  Chronic pain   -Pain control with bowel regimen  Shortness of breath with bilateral pleural effusion/atelectasis without hypoxia, improved -Out of bed to chair.  Incentive spirometer.  As needed bronchodilators liters -Echocardiogram -EF 60 to 65%, no evidence of endocarditis  Diabetes mellitus type 2, in acceptable range -Peripheral neuropathy secondary to DM2 -Insulin sliding scale and Accu-Chek -Gabapentin 3 times daily  Hyperlipidemia Cont Statin   DVT prophylaxis: Lovenox Code Status: DNR Family Communication:    Status is: Inpatient  Remains inpatient appropriate because:Inpatient level of care appropriate due to severity of illness   Dispo: The patient is from: Home              Anticipated d/c is to: SNF              Anticipated d/c date is: 2-3 days              Patient currently is not medically stable to d/c.  Awaiting bacteremia to clear up so PICC line can be placed for IV antibiotics upon discharge   Difficult to place patient No    Body mass index is 25.94 kg/m.     Subjective: Remains afebrile overnight but persistently remains bacteremic.  Review of Systems Otherwise negative except as per HPI, including: General: Denies fever, chills, night sweats or unintended weight loss. Resp: Denies cough, wheezing, shortness of breath. Cardiac: Denies chest pain, palpitations, orthopnea, paroxysmal nocturnal dyspnea. GI: Denies abdominal pain, nausea, vomiting, diarrhea or constipation GU: Denies dysuria, frequency, hesitancy or incontinence MS: Denies muscle aches, joint pain  or swelling Neuro: Denies headache, neurologic deficits (focal  weakness, numbness, tingling), abnormal gait Psych: Denies anxiety, depression, SI/HI/AVH Skin: Denies new rashes or lesions ID: Denies sick contacts, exotic exposures, travel  Examination: Constitutional: Not in acute distress Respiratory: Clear to auscultation bilaterally Cardiovascular: Normal sinus rhythm, no rubs Abdomen: Nontender nondistended good bowel sounds Musculoskeletal: No edema noted Skin: Left lower extremity dressing Neurologic: CN 2-12 grossly intact.  And nonfocal Psychiatric: Normal judgment and insight. Alert and oriented x 3. Normal mood.  Objective: Vitals:   11/16/20 1535 11/16/20 1925 11/17/20 0351 11/17/20 0826  BP: 111/78 123/71 128/68 111/71  Pulse: (!) 101 96 99 94  Resp: 18 20 20 20   Temp: 98.4 F (36.9 C) 97.7 F (36.5 C) 98.1 F (36.7 C) 98 F (36.7 C)  TempSrc: Oral Oral Oral Oral  SpO2: 95% 99% 95% 99%  Weight:      Height:        Intake/Output Summary (Last 24 hours) at 11/17/2020 1106 Last data filed at 11/17/2020 0754 Gross per 24 hour  Intake 480 ml  Output 1400 ml  Net -920 ml   Filed Weights   11/11/20 1452  Weight: 82 kg     Data Reviewed:   CBC: Recent Labs  Lab 11/13/20 0636 11/14/20 0014 11/15/20 0417 11/16/20 0523 11/17/20 0458  WBC 5.2 4.6 4.9 4.7 4.8  HGB 9.4* 9.7* 10.2* 10.4* 10.3*  HCT 29.4* 29.6* 32.5* 31.6* 31.6*  MCV 88.3 87.6 88.6 86.8 86.8  PLT 149* 166 176 191 409   Basic Metabolic Panel: Recent Labs  Lab 11/13/20 0636 11/14/20 0014 11/15/20 0417 11/16/20 0523 11/17/20 0458  NA 132* 133* 136 131* 131*  K 4.0 4.0 4.3 4.0 4.1  CL 97* 100 101 98 98  CO2 26 25 29 24 25   GLUCOSE 148* 165* 115* 198* 159*  BUN 13 12 7* 8 9  CREATININE 0.97 0.94 0.85 0.83 0.85  CALCIUM 7.9* 7.9* 8.0* 8.3* 8.6*  MG 2.0 2.1 2.1 1.9 2.0   GFR: Estimated Creatinine Clearance: 87.1 mL/min (by C-G formula based on SCr of 0.85 mg/dL). Liver Function Tests: Recent Labs  Lab 11/11/20 1513  AST 23  ALT 22   ALKPHOS 56  BILITOT 1.2  PROT 6.0*  ALBUMIN 2.7*   No results for input(s): LIPASE, AMYLASE in the last 168 hours. No results for input(s): AMMONIA in the last 168 hours. Coagulation Profile: Recent Labs  Lab 11/12/20 0615  INR 1.2   Cardiac Enzymes: Recent Labs  Lab 11/14/20 0014  CKTOTAL 26*   BNP (last 3 results) No results for input(s): PROBNP in the last 8760 hours. HbA1C: No results for input(s): HGBA1C in the last 72 hours. CBG: Recent Labs  Lab 11/16/20 0747 11/16/20 1212 11/16/20 1706 11/16/20 2303 11/17/20 0728  GLUCAP 189* 178* 184* 167* 163*   Lipid Profile: No results for input(s): CHOL, HDL, LDLCALC, TRIG, CHOLHDL, LDLDIRECT in the last 72 hours. Thyroid Function Tests: No results for input(s): TSH, T4TOTAL, FREET4, T3FREE, THYROIDAB in the last 72 hours. Anemia Panel: No results for input(s): VITAMINB12, FOLATE, FERRITIN, TIBC, IRON, RETICCTPCT in the last 72 hours. Sepsis Labs: Recent Labs  Lab 11/11/20 1513 11/11/20 1638 11/11/20 1845  PROCALCITON  --   --  0.34  LATICACIDVEN 1.1 2.1* 1.2    Recent Results (from the past 240 hour(s))  Blood culture (routine x 2)     Status: Abnormal   Collection Time: 11/11/20  3:13 PM   Specimen: BLOOD  Result  Value Ref Range Status   Specimen Description   Final    BLOOD RIGHT ANTECUBITAL Performed at [facility], [address] [place], [place] [address]    Special Requests   Final    BOTTLES DRAWN AEROBIC AND ANAEROBIC Blood Culture adequate volume Performed at [facility], [address] [place], [place] [address]    Culture  Setup Time   Final    GRAM POSITIVE COCCI IN BOTH AEROBIC AND ANAEROBIC BOTTLES Organism ID to follow CRITICAL RESULT CALLED TO, READ BACK BY AND VERIFIED WITHVioleta Gelinas PHARMD [id number] 10/28/20 HNM Performed at [facility], [address], [place],  [address]    Culture ENTEROCOCCUS FAECALIS STAPHYLOCOCCUS AUREUS  (A)  Final    Report Status 11/15/2020 FINAL  Final   Organism ID, Bacteria ENTEROCOCCUS FAECALIS  Final   Organism ID, Bacteria STAPHYLOCOCCUS AUREUS  Final      Susceptibility   Enterococcus faecalis - MIC*    AMPICILLIN <=2 SENSITIVE Sensitive     VANCOMYCIN 1 SENSITIVE Sensitive     GENTAMICIN SYNERGY SENSITIVE Sensitive     * ENTEROCOCCUS FAECALIS   Staphylococcus aureus - MIC*    CIPROFLOXACIN <=0.5 SENSITIVE Sensitive     ERYTHROMYCIN >=8 RESISTANT Resistant     GENTAMICIN <=0.5 SENSITIVE Sensitive     OXACILLIN <=0.25 SENSITIVE Sensitive     TETRACYCLINE <=1 SENSITIVE Sensitive     VANCOMYCIN <=0.5 SENSITIVE Sensitive     TRIMETH/SULFA <=10 SENSITIVE Sensitive     CLINDAMYCIN RESISTANT Resistant     RIFAMPIN <=0.5 SENSITIVE Sensitive     Inducible Clindamycin POSITIVE Resistant     * STAPHYLOCOCCUS AUREUS  Blood Culture ID Panel (Reflexed)     Status: Abnormal   Collection Time: 11/11/20  3:13 PM  Result Value Ref Range Status   Enterococcus faecalis DETECTED (A) NOT DETECTED Final    Comment: CRITICAL RESULT CALLED TO, READ BACK BY AND VERIFIED WITH: Violeta Gelinas PHAMRD [id number] 11/12/20 HNM    Enterococcus Faecium NOT DETECTED NOT DETECTED Final   Listeria monocytogenes NOT DETECTED NOT DETECTED Final   Staphylococcus species NOT DETECTED NOT DETECTED Final   Staphylococcus aureus (BCID) NOT DETECTED NOT DETECTED Final   Staphylococcus epidermidis NOT DETECTED NOT DETECTED Final   Staphylococcus lugdunensis NOT DETECTED NOT DETECTED Final   Streptococcus species NOT DETECTED NOT DETECTED Final   Streptococcus agalactiae NOT DETECTED NOT DETECTED Final   Streptococcus pneumoniae NOT DETECTED NOT DETECTED Final   Streptococcus pyogenes NOT DETECTED NOT DETECTED Final   A.calcoaceticus-baumannii NOT DETECTED NOT DETECTED Final   Bacteroides fragilis NOT DETECTED NOT DETECTED Final   Enterobacterales NOT DETECTED NOT DETECTED Final   Enterobacter cloacae complex NOT DETECTED NOT  DETECTED Final   Escherichia coli NOT DETECTED NOT DETECTED Final   Klebsiella aerogenes NOT DETECTED NOT DETECTED Final   Klebsiella oxytoca NOT DETECTED NOT DETECTED Final   Klebsiella pneumoniae NOT DETECTED NOT DETECTED Final   Proteus species NOT DETECTED NOT DETECTED Final   Salmonella species NOT DETECTED NOT DETECTED Final   Serratia marcescens NOT DETECTED NOT DETECTED Final   Haemophilus influenzae NOT DETECTED NOT DETECTED Final   Neisseria meningitidis NOT DETECTED NOT DETECTED Final   Pseudomonas aeruginosa NOT DETECTED NOT DETECTED Final   Stenotrophomonas maltophilia NOT DETECTED NOT DETECTED Final   Candida albicans NOT DETECTED NOT DETECTED Final   Candida auris NOT DETECTED NOT DETECTED Final   Candida glabrata NOT DETECTED NOT DETECTED Final   Candida krusei  NOT DETECTED NOT DETECTED Final   Candida parapsilosis NOT DETECTED NOT DETECTED Final   Candida tropicalis NOT DETECTED NOT DETECTED Final   Cryptococcus neoformans/gattii NOT DETECTED NOT DETECTED Final   Vancomycin resistance NOT DETECTED NOT DETECTED Final    Comment: Performed at [facility], [address] [place], [place] [address]  Blood culture (routine x 2)     Status: Abnormal   Collection Time: 11/11/20  4:38 PM   Specimen: BLOOD  Result Value Ref Range Status   Specimen Description   Final    BLOOD LEFT ANTECUBITAL Performed at [facility], [address] [place], [place] [address]    Special Requests   Final    BOTTLES DRAWN AEROBIC AND ANAEROBIC Blood Culture results may not be optimal due to an inadequate volume of blood received in culture bottles Performed at [facility], [address] [place], [place] [address]    Culture  Setup Time   Final    GRAM POSITIVE COCCI IN BOTH AEROBIC AND ANAEROBIC BOTTLES CRITICAL VALUE NOTED.  VALUE IS CONSISTENT WITH PREVIOUSLY REPORTED AND CALLED VALUE. Performed at [facility], [address]  [place], [place] [address]    Culture (A)  Final    ENTEROCOCCUS FAECALIS STAPHYLOCOCCUS AUREUS SUSCEPTIBILITIES PERFORMED ON PREVIOUS CULTURE WITHIN THE LAST 5 DAYS. Performed at [facility], [place] [address], [place], [place] [address]    Report Status 11/15/2020 FINAL  Final  Resp Panel by RT-PCR (Flu A&B, Covid) Nasopharyngeal Swab     Status: None   Collection Time: 11/11/20  6:45 PM   Specimen: Nasopharyngeal Swab; Nasopharyngeal(NP) swabs in vial transport medium  Result Value Ref Range Status   SARS Coronavirus 2 by RT PCR NEGATIVE NEGATIVE Final    Comment: (NOTE) SARS-CoV-2 target nucleic acids are NOT DETECTED.  The SARS-CoV-2 RNA is generally detectable in upper respiratory specimens during the acute phase of infection. The lowest concentration of SARS-CoV-2 viral copies this assay can detect is 138 copies/mL. A negative result does not preclude SARS-Cov-2 infection and should not be used as the sole basis for treatment or other patient management decisions. A negative result may occur with  improper specimen collection/handling, submission of specimen other than nasopharyngeal swab, presence of viral mutation(s) within the areas targeted by this assay, and inadequate number of viral copies(<138 copies/mL). A negative result must be combined with clinical observations, patient history, and epidemiological information. The expected result is Negative.  Fact Sheet for Patients:  EntrepreneurPulse.com.au  Fact Sheet for Healthcare Providers:  IncredibleEmployment.be  This test is no t yet approved or cleared by the [place] FDA and  has been authorized for detection and/or diagnosis of SARS-CoV-2 by FDA under an Emergency Use Authorization (EUA). This EUA will remain  in effect (meaning this test can be used) for the duration of the COVID-19 declaration under Section 564(b)(1) of the Act, 21 U.S.C.section 360bbb-3(b)(1), unless  the authorization is terminated  or revoked sooner.       Influenza A by PCR NEGATIVE NEGATIVE Final   Influenza B by PCR NEGATIVE NEGATIVE Final    Comment: (NOTE) The Xpert Xpress SARS-CoV-2/FLU/RSV plus assay is intended as an aid in the diagnosis of influenza from Nasopharyngeal swab specimens and should not be used as a sole basis for treatment. Nasal washings and aspirates are unacceptable for Xpert Xpress SARS-CoV-2/FLU/RSV testing.  Fact Sheet for Patients: EntrepreneurPulse.com.au  Fact Sheet for Healthcare Providers: IncredibleEmployment.be  This test is not  yet approved or cleared by the [place] and has been authorized for detection and/or diagnosis of SARS-CoV-2 by FDA under an Emergency Use Authorization (EUA). This EUA will remain in effect (meaning this test can be used) for the duration of the COVID-19 declaration under Section 564(b)(1) of the Act, 21 U.S.C. section 360bbb-3(b)(1), unless the authorization is terminated or revoked.  Performed at [facility], [address] [place], [place] [address]   Aerobic/Anaerobic Culture w Gram Stain (surgical/deep wound)     Status: None   Collection Time: 11/12/20  1:01 PM   Specimen: Wound  Result Value Ref Range Status   Specimen Description   Final    WOUND Performed at [facility], [address] [place], [place] [address]    Special Requests   Final    LEFT FOOT Performed at [facility], [address] [place], [place] [address]    Gram Stain NO WBC SEEN ABUNDANT GRAM POSITIVE COCCI   Final   Culture   Final    ABUNDANT STAPHYLOCOCCUS AUREUS MODERATE ENTEROCOCCUS FAECALIS FEW PREVOTELLA BIVIA BETA LACTAMASE POSITIVE Performed at [facility], [place] [address] [place], [place] [address]    Report Status 11/16/2020 FINAL  Final   Organism ID, Bacteria STAPHYLOCOCCUS AUREUS  Final   Organism ID, Bacteria ENTEROCOCCUS  FAECALIS  Final      Susceptibility   Enterococcus faecalis - MIC*    AMPICILLIN <=2 SENSITIVE Sensitive     VANCOMYCIN 1 SENSITIVE Sensitive     GENTAMICIN SYNERGY SENSITIVE Sensitive     * MODERATE ENTEROCOCCUS FAECALIS   Staphylococcus aureus - MIC*    CIPROFLOXACIN <=0.5 SENSITIVE Sensitive     ERYTHROMYCIN >=8 RESISTANT Resistant     GENTAMICIN <=0.5 SENSITIVE Sensitive     OXACILLIN <=0.25 SENSITIVE Sensitive     TETRACYCLINE <=1 SENSITIVE Sensitive     VANCOMYCIN 1 SENSITIVE Sensitive     TRIMETH/SULFA <=10 SENSITIVE Sensitive     CLINDAMYCIN RESISTANT Resistant     RIFAMPIN <=0.5 SENSITIVE Sensitive     Inducible Clindamycin POSITIVE Resistant     * ABUNDANT STAPHYLOCOCCUS AUREUS  Culture, blood (Routine X 2) w Reflex to ID Panel     Status: Abnormal   Collection Time: 11/12/20 11:53 PM   Specimen: BLOOD  Result Value Ref Range Status   Specimen Description   Final    BLOOD BLOOD LEFT HAND Performed at [facility], [address], [place], [place] [address]    Special Requests   Final    BOTTLES DRAWN AEROBIC AND ANAEROBIC Blood Culture adequate volume Performed at [facility], [address] [place], [place] [address]    Culture  Setup Time   Final    Organism ID to follow ANAEROBIC BOTTLE ONLY GRAM POSITIVE COCCI CRITICAL RESULT CALLED TO, READ BACK BY AND VERIFIED WITH: SUSAN WATSON 11/13/20 1629 KLW Performed at [facility], [address] [place], [place] [address]    Culture (A)  Final    ENTEROCOCCUS FAECALIS SUSCEPTIBILITIES PERFORMED ON PREVIOUS CULTURE WITHIN THE LAST 5 DAYS. Performed at [facility], [place] [address], [place],  [address]    Report Status 11/16/2020 FINAL  Final  Culture, blood (Routine X 2) w Reflex to ID Panel     Status: None (Preliminary result)   Collection Time: 11/12/20 11:53 PM   Specimen: BLOOD  Result Value Ref Range Status   Specimen Description BLOOD BLOOD RIGHT HAND  Final    Special Requests   Final    BOTTLES DRAWN AEROBIC AND ANAEROBIC Blood Culture results may not be optimal due to an inadequate volume of blood received in culture bottles   Culture   Final    NO GROWTH 4 DAYS Performed at [facility], [address] [place], [place] [address]    Report Status PENDING  Incomplete  Blood Culture ID Panel (Reflexed)     Status: Abnormal   Collection Time: 11/12/20 11:53 PM  Result Value Ref Range Status   Enterococcus faecalis DETECTED (A) NOT DETECTED Final    Comment: CRITICAL RESULT CALLED TO, READ BACK BY AND VERIFIED WITH: SUSAN WATSON 11/13/20 1629 KLW    Enterococcus Faecium NOT DETECTED NOT DETECTED Final   Listeria monocytogenes NOT DETECTED NOT DETECTED Final   Staphylococcus species NOT DETECTED NOT DETECTED Final   Staphylococcus aureus (BCID) NOT DETECTED NOT DETECTED Final   Staphylococcus epidermidis NOT DETECTED NOT DETECTED Final   Staphylococcus lugdunensis NOT DETECTED NOT DETECTED Final   Streptococcus species NOT DETECTED NOT DETECTED Final   Streptococcus agalactiae NOT DETECTED NOT DETECTED Final   Streptococcus pneumoniae NOT DETECTED NOT DETECTED Final   Streptococcus pyogenes NOT DETECTED NOT DETECTED Final   A.calcoaceticus-baumannii NOT DETECTED NOT DETECTED Final   Bacteroides fragilis NOT DETECTED NOT DETECTED Final   Enterobacterales NOT DETECTED NOT DETECTED Final   Enterobacter cloacae complex NOT DETECTED NOT DETECTED Final   Escherichia coli NOT DETECTED NOT DETECTED Final   Klebsiella aerogenes NOT DETECTED NOT DETECTED Final   Klebsiella oxytoca NOT DETECTED NOT DETECTED Final   Klebsiella pneumoniae NOT DETECTED NOT DETECTED Final   Proteus species NOT DETECTED NOT DETECTED Final   Salmonella species NOT DETECTED NOT DETECTED Final   Serratia marcescens NOT DETECTED NOT DETECTED Final   Haemophilus influenzae NOT DETECTED NOT DETECTED Final   Neisseria meningitidis NOT DETECTED NOT DETECTED Final    Pseudomonas aeruginosa NOT DETECTED NOT DETECTED Final   Stenotrophomonas maltophilia NOT DETECTED NOT DETECTED Final   Candida albicans NOT DETECTED NOT DETECTED Final   Candida auris NOT DETECTED NOT DETECTED Final   Candida glabrata NOT DETECTED NOT DETECTED Final   Candida krusei NOT DETECTED NOT DETECTED Final   Candida parapsilosis NOT DETECTED NOT DETECTED Final   Candida tropicalis NOT DETECTED NOT DETECTED Final   Cryptococcus neoformans/gattii NOT DETECTED NOT DETECTED Final   Vancomycin resistance NOT DETECTED NOT DETECTED Final    Comment: Performed at [facility], [address] [place], [place] [address]  CULTURE, BLOOD (ROUTINE X 2) w Reflex to ID Panel     Status: None (Preliminary result)   Collection Time: 11/14/20 12:04 AM   Specimen: BLOOD  Result Value Ref Range Status   Specimen Description BLOOD BLOOD RIGHT HAND  Final   Special Requests   Final    BOTTLES DRAWN AEROBIC AND ANAEROBIC Blood Culture adequate volume   Culture   Final    NO GROWTH 3 DAYS Performed at [facility], [address] [place], [place] [address]    Report Status PENDING  Incomplete  CULTURE, BLOOD (ROUTINE X 2) w Reflex to ID Panel     Status: None (Preliminary result)   Collection Time: 11/14/20 12:14 AM   Specimen: BLOOD  Result Value Ref Range Status   Specimen Description BLOOD BLOOD LEFT HAND  Final   Special Requests   Final    BOTTLES DRAWN AEROBIC AND ANAEROBIC Blood Culture results may not  be optimal due to an excessive volume of blood received in culture bottles   Culture   Final    NO GROWTH 3 DAYS Performed at [facility], [address] [place], [place] [address]    Report Status PENDING  Incomplete  Culture, blood (Routine X 2) w Reflex to ID Panel     Status: None (Preliminary result)   Collection Time: 11/15/20  4:17 AM   Specimen: BLOOD  Result Value Ref Range Status   Specimen Description BLOOD BLOOD RIGHT HAND  Final   Special  Requests   Final    BOTTLES DRAWN AEROBIC AND ANAEROBIC Blood Culture adequate volume   Culture  Setup Time   Final    ANAEROBIC BOTTLE ONLY GRAM POSITIVE COCCI CRITICAL VALUE NOTED.  VALUE IS CONSISTENT WITH PREVIOUSLY REPORTED AND CALLED VALUE. Performed at [facility], [address] [place], [place] [address]    Culture [facility] POSITIVE COCCI  Final   Report Status PENDING  Incomplete  Culture, blood (Routine X 2) w Reflex to ID Panel     Status: Abnormal (Preliminary result)   Collection Time: 11/15/20  4:18 AM   Specimen: BLOOD  Result Value Ref Range Status   Specimen Description   Final    BLOOD BLOOD LEFT HAND Performed at [facility], [address], [place], [place] [address]    Special Requests   Final    BOTTLES DRAWN AEROBIC AND ANAEROBIC Blood Culture adequate volume Performed at [facility], [address] [place], [place] [address]    Culture  Setup Time   Final    IN BOTH AEROBIC AND ANAEROBIC BOTTLES GRAM POSITIVE COCCI CRITICAL RESULT CALLED TO, READ BACK BY AND VERIFIED WITH: Oneita Kras [facility] AT 2235 11/15/20 MF    Culture (A)  Final    ENTEROCOCCUS FAECALIS SUSCEPTIBILITIES TO FOLLOW Performed at [facility], [place] [address], [place], [place] [address]    Report Status PENDING  Incomplete  Blood Culture ID Panel (Reflexed)     Status: Abnormal   Collection Time: 11/15/20  4:18 AM  Result Value Ref Range Status   Enterococcus faecalis DETECTED (A) NOT DETECTED Final    Comment: CRITICAL RESULT CALLED TO, READ BACK BY AND VERIFIED WITH: Oneita Kras [facility] AT 2235 11/15/20 MF.    Enterococcus Faecium NOT DETECTED NOT DETECTED Final   Listeria monocytogenes NOT DETECTED NOT DETECTED Final   Staphylococcus species NOT DETECTED NOT DETECTED Final   Staphylococcus aureus (BCID) NOT DETECTED NOT DETECTED Final   Staphylococcus epidermidis NOT DETECTED NOT DETECTED Final   Staphylococcus lugdunensis NOT DETECTED NOT DETECTED Final    Streptococcus species NOT DETECTED NOT DETECTED Final   Streptococcus agalactiae NOT DETECTED NOT DETECTED Final   Streptococcus pneumoniae NOT DETECTED NOT DETECTED Final   Streptococcus pyogenes NOT DETECTED NOT DETECTED Final   A.calcoaceticus-baumannii NOT DETECTED NOT DETECTED Final   Bacteroides fragilis NOT DETECTED NOT DETECTED Final   Enterobacterales NOT DETECTED NOT DETECTED Final   Enterobacter cloacae complex NOT DETECTED NOT DETECTED Final   Escherichia coli NOT DETECTED NOT DETECTED Final   Klebsiella aerogenes NOT DETECTED NOT DETECTED Final   Klebsiella oxytoca NOT DETECTED NOT DETECTED Final   Klebsiella pneumoniae NOT DETECTED NOT DETECTED Final   Proteus species NOT DETECTED NOT DETECTED Final   Salmonella species NOT DETECTED NOT DETECTED Final   Serratia marcescens NOT DETECTED NOT DETECTED Final   Haemophilus influenzae NOT DETECTED NOT DETECTED Final   Neisseria meningitidis  NOT DETECTED NOT DETECTED Final   Pseudomonas aeruginosa NOT DETECTED NOT DETECTED Final   Stenotrophomonas maltophilia NOT DETECTED NOT DETECTED Final   Candida albicans NOT DETECTED NOT DETECTED Final   Candida auris NOT DETECTED NOT DETECTED Final   Candida glabrata NOT DETECTED NOT DETECTED Final   Candida krusei NOT DETECTED NOT DETECTED Final   Candida parapsilosis NOT DETECTED NOT DETECTED Final   Candida tropicalis NOT DETECTED NOT DETECTED Final   Cryptococcus neoformans/gattii NOT DETECTED NOT DETECTED Final   Vancomycin resistance NOT DETECTED NOT DETECTED Final    Comment: Performed at [facility], [address] [place], [place] [address]  Culture, blood (Routine X 2) w Reflex to ID Panel     Status: None (Preliminary result)   Collection Time: 11/16/20  8:19 AM   Specimen: BLOOD  Result Value Ref Range Status   Specimen Description BLOOD RIGHT ARM  Final   Special Requests   Final    BOTTLES DRAWN AEROBIC AND ANAEROBIC Blood Culture adequate volume   Culture   Setup Time   Final    GRAM POSITIVE COCCI IN BOTH AEROBIC AND ANAEROBIC BOTTLES CRITICAL VALUE NOTED.  VALUE IS CONSISTENT WITH PREVIOUSLY REPORTED AND CALLED VALUE. Performed at [facility], [address] [place], [place] [address]    Culture [facility] POSITIVE COCCI  Final   Report Status PENDING  Incomplete  Culture, blood (Routine X 2) w Reflex to ID Panel     Status: None (Preliminary result)   Collection Time: 11/16/20  8:36 AM   Specimen: BLOOD  Result Value Ref Range Status   Specimen Description BLOOD LEFT HAND  Final   Special Requests   Final    BOTTLES DRAWN AEROBIC AND ANAEROBIC Blood Culture adequate volume   Culture  Setup Time   Final    Organism ID to follow GRAM POSITIVE COCCI IN BOTH AEROBIC AND ANAEROBIC BOTTLES CRITICAL RESULT CALLED TO, READ BACK BY AND VERIFIED WITH: JASON ROBINS @0029  ON 11/17/20 SKL Performed at [facility], [address] [place], [place] [address]    Culture GRAM POSITIVE COCCI  Final   Report Status PENDING  Incomplete  Blood Culture ID Panel (Reflexed)     Status: Abnormal   Collection Time: 11/16/20  8:36 AM  Result Value Ref Range Status   Enterococcus faecalis DETECTED (A) NOT DETECTED Final    Comment: CRITICAL RESULT CALLED TO, READ BACK BY AND VERIFIED WITH: JASON ROBINS @0029  ON 11/17/20 SKL    Enterococcus Faecium NOT DETECTED NOT DETECTED Final   Listeria monocytogenes NOT DETECTED NOT DETECTED Final   Staphylococcus species NOT DETECTED NOT DETECTED Final   Staphylococcus aureus (BCID) NOT DETECTED NOT DETECTED Final   Staphylococcus epidermidis NOT DETECTED NOT DETECTED Final   Staphylococcus lugdunensis NOT DETECTED NOT DETECTED Final   Streptococcus species NOT DETECTED NOT DETECTED Final   Streptococcus agalactiae NOT DETECTED NOT DETECTED Final   Streptococcus pneumoniae NOT DETECTED NOT DETECTED Final   Streptococcus pyogenes NOT DETECTED NOT DETECTED Final   A.calcoaceticus-baumannii NOT DETECTED  NOT DETECTED Final   Bacteroides fragilis NOT DETECTED NOT DETECTED Final   Enterobacterales NOT DETECTED NOT DETECTED Final   Enterobacter cloacae complex NOT DETECTED NOT DETECTED Final   Escherichia coli NOT DETECTED NOT DETECTED Final   Klebsiella aerogenes NOT DETECTED NOT DETECTED Final   Klebsiella oxytoca NOT DETECTED NOT DETECTED Final   Klebsiella pneumoniae NOT DETECTED NOT DETECTED Final   Proteus species NOT DETECTED NOT DETECTED  Final   Salmonella species NOT DETECTED NOT DETECTED Final   Serratia marcescens NOT DETECTED NOT DETECTED Final   Haemophilus influenzae NOT DETECTED NOT DETECTED Final   Neisseria meningitidis NOT DETECTED NOT DETECTED Final   Pseudomonas aeruginosa NOT DETECTED NOT DETECTED Final   Stenotrophomonas maltophilia NOT DETECTED NOT DETECTED Final   Candida albicans NOT DETECTED NOT DETECTED Final   Candida auris NOT DETECTED NOT DETECTED Final   Candida glabrata NOT DETECTED NOT DETECTED Final   Candida krusei NOT DETECTED NOT DETECTED Final   Candida parapsilosis NOT DETECTED NOT DETECTED Final   Candida tropicalis NOT DETECTED NOT DETECTED Final   Cryptococcus neoformans/gattii NOT DETECTED NOT DETECTED Final   Vancomycin resistance NOT DETECTED NOT DETECTED Final    Comment: Performed at [facility], [address], [place], [place] [address]         Radiology Studies: No results found.      Scheduled Meds: . aspirin EC  81 mg Oral Daily  . clopidogrel  75 mg Oral Daily  . collagenase   Topical Daily  . enoxaparin (LOVENOX) injection  40 mg Subcutaneous Q24H  . fluticasone  2 spray Each Nare Daily  . gabapentin  300 mg Oral TID  . insulin aspart  0-15 Units Subcutaneous TID WC  . insulin aspart  0-5 Units Subcutaneous QHS  . mometasone-formoterol  2 puff Inhalation BID  . oxybutynin  5 mg Oral BID  . pantoprazole  40 mg Oral Daily   Or  . pantoprazole (PROTONIX) IV  40 mg Intravenous Daily  . polyethylene glycol  17  g Oral Daily  . pravastatin  10 mg Oral q1800   Continuous Infusions: . sodium chloride Stopped (11/13/20 1206)  . DAPTOmycin (CUBICIN)  IV 650 mg (11/16/20 2336)     LOS: 6 days   Time spent= 35 mins    Charell Faulk Arsenio Loader, MD [facility]  If 7PM-7AM, please contact night-coverage  11/17/2020, 11:06 AM

## 2020-11-18 ENCOUNTER — Inpatient Hospital Stay: Payer: Medicare HMO

## 2020-11-18 ENCOUNTER — Encounter: Payer: Self-pay | Admitting: Internal Medicine

## 2020-11-18 DIAGNOSIS — R7881 Bacteremia: Secondary | ICD-10-CM | POA: Diagnosis not present

## 2020-11-18 DIAGNOSIS — I428 Other cardiomyopathies: Secondary | ICD-10-CM | POA: Diagnosis not present

## 2020-11-18 DIAGNOSIS — R188 Other ascites: Secondary | ICD-10-CM | POA: Diagnosis not present

## 2020-11-18 DIAGNOSIS — I509 Heart failure, unspecified: Secondary | ICD-10-CM | POA: Diagnosis not present

## 2020-11-18 DIAGNOSIS — B952 Enterococcus as the cause of diseases classified elsewhere: Secondary | ICD-10-CM | POA: Diagnosis not present

## 2020-11-18 DIAGNOSIS — Z794 Long term (current) use of insulin: Secondary | ICD-10-CM

## 2020-11-18 DIAGNOSIS — E11621 Type 2 diabetes mellitus with foot ulcer: Secondary | ICD-10-CM | POA: Diagnosis not present

## 2020-11-18 DIAGNOSIS — E1165 Type 2 diabetes mellitus with hyperglycemia: Secondary | ICD-10-CM | POA: Diagnosis not present

## 2020-11-18 DIAGNOSIS — L089 Local infection of the skin and subcutaneous tissue, unspecified: Secondary | ICD-10-CM | POA: Diagnosis not present

## 2020-11-18 DIAGNOSIS — E11628 Type 2 diabetes mellitus with other skin complications: Secondary | ICD-10-CM | POA: Diagnosis not present

## 2020-11-18 DIAGNOSIS — A419 Sepsis, unspecified organism: Secondary | ICD-10-CM | POA: Diagnosis not present

## 2020-11-18 LAB — GLUCOSE, CAPILLARY
Glucose-Capillary: 164 mg/dL — ABNORMAL HIGH (ref 70–99)
Glucose-Capillary: 144 mg/dL — ABNORMAL HIGH (ref 70–99)
Glucose-Capillary: 116 mg/dL — ABNORMAL HIGH (ref 70–99)
Glucose-Capillary: 148 mg/dL — ABNORMAL HIGH (ref 70–99)

## 2020-11-18 LAB — CBC
HCT: 31 % — ABNORMAL LOW (ref 39.0–52.0)
Hemoglobin: 10.1 g/dL — ABNORMAL LOW (ref 13.0–17.0)
MCH: 28.1 pg (ref 26.0–34.0)
MCHC: 32.6 g/dL (ref 30.0–36.0)
MCV: 86.4 fL (ref 80.0–100.0)
Platelets: 181 10*3/uL (ref 150–400)
RBC: 3.59 MIL/uL — ABNORMAL LOW (ref 4.22–5.81)
RDW: 13.9 % (ref 11.5–15.5)
WBC: 5.4 10*3/uL (ref 4.0–10.5)
nRBC: 0 % (ref 0.0–0.2)

## 2020-11-18 LAB — CULTURE, BLOOD (ROUTINE X 2)
Culture: NO GROWTH
Special Requests: ADEQUATE
Special Requests: ADEQUATE

## 2020-11-18 LAB — COMPREHENSIVE METABOLIC PANEL
ALT: 18 U/L (ref 0–44)
AST: 21 U/L (ref 15–41)
Albumin: 2.7 g/dL — ABNORMAL LOW (ref 3.5–5.0)
Alkaline Phosphatase: 42 U/L (ref 38–126)
Anion gap: 8 (ref 5–15)
BUN: 10 mg/dL (ref 8–23)
CO2: 24 mmol/L (ref 22–32)
Calcium: 8.2 mg/dL — ABNORMAL LOW (ref 8.9–10.3)
Chloride: 99 mmol/L (ref 98–111)
Creatinine, Ser: 0.86 mg/dL (ref 0.61–1.24)
GFR, Estimated: >60 mL/min (ref >60)
Glucose, Bld: 126 mg/dL — ABNORMAL HIGH (ref 70–99)
Potassium: 4.1 mmol/L (ref 3.5–5.1)
Sodium: 131 mmol/L — ABNORMAL LOW (ref 135–145)
Total Bilirubin: 1 mg/dL (ref 0.3–1.2)
Total Protein: 6.4 g/dL — ABNORMAL LOW (ref 6.5–8.1)

## 2020-11-18 LAB — C-REACTIVE PROTEIN: CRP: 2.1 mg/dL — ABNORMAL HIGH (ref <1.0)

## 2020-11-18 LAB — MAGNESIUM: Magnesium: 2 mg/dL (ref 1.7–2.4)

## 2020-11-18 LAB — BRAIN NATRIURETIC PEPTIDE: B Natriuretic Peptide: 442.7 pg/mL — ABNORMAL HIGH (ref 0.0–100.0)

## 2020-11-18 IMAGING — MR MR CERVICAL SPINE WO/W CM
6 of 9 series · 26 of 48 positions shown · IV contrast (gadavist)
Comparison: None.

CLINICAL DATA: Cervical radiculopathy, infection suspected.

Epidural abscess.
Lumbar radiculopathy, cancer or infection suspected.
EXAM:
MRI CERVICAL, THORACIC AND LUMBAR SPINE WITHOUT AND WITH CONTRAST
TECHNIQUE: Multiplanar and multiecho pulse sequences of the cervical spine, to
include the craniocervical junction and cervicothoracic junction,
and thoracic and lumbar spine, were obtained without and with
intravenous contrast.
CONTRAST:  8mL GADAVIST GADOBUTROL 1 MMOL/ML IV SOLN

[Series 16: T1 · sagittal · 5.0mm · 1.88mm/px · 1 of 9 slices shown (1 of 2)]
[im 1/9]
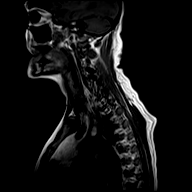

[Series 17: T2 · sagittal · 3.0mm · 0.62mm/px · 4 of 15 slices shown (1 of 2)]
[im 1/15]
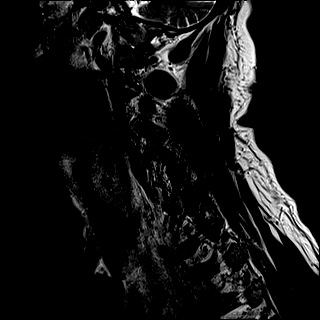
[im 5/15]
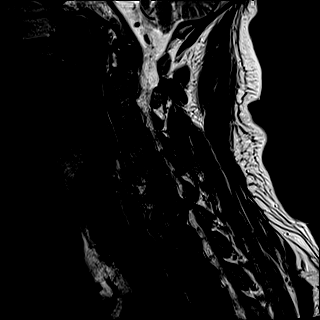
[im 10/15]
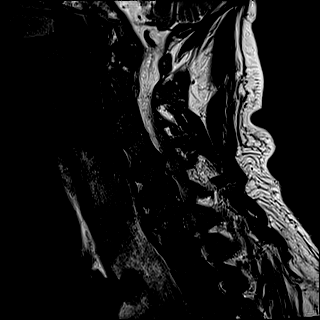
[im 15/15]
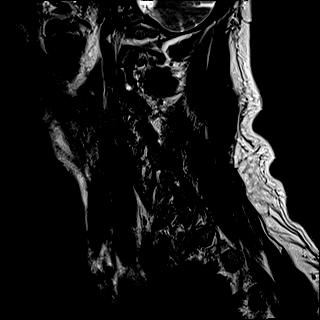

[Series 19: STIR · sagittal · 3.0mm · 0.62mm/px · 4 of 15 slices shown]
[im 1/15]
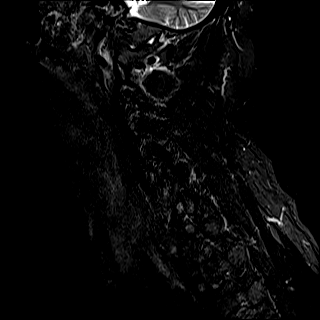
[im 5/15]
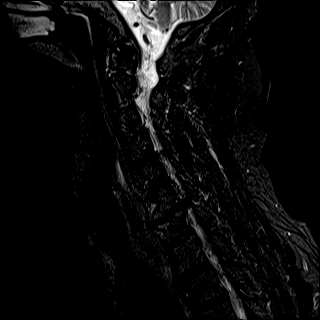
[im 10/15]
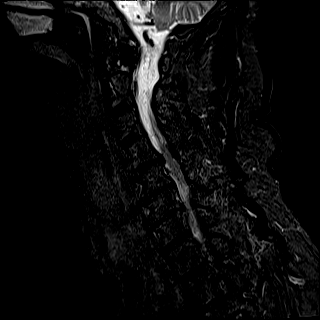
[im 15/15]
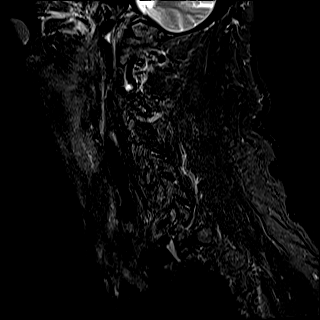

[Series 20: T2 · axial · 3.0mm · 0.70mm/px · z∈[-106,-15]mm · 7 of 29 slices shown (2 of 2)]
[im 1/29]
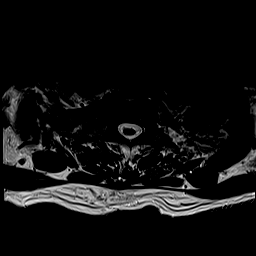
[im 5/29]
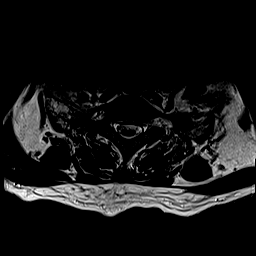
[im 10/29]
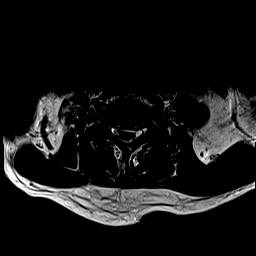
[im 15/29]
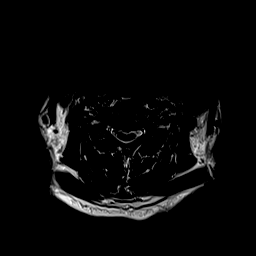
[im 19/29]
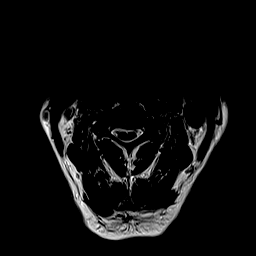
[im 24/29]
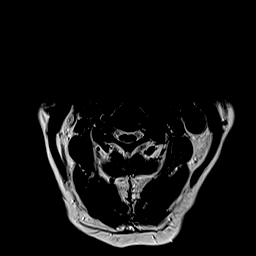
[im 29/29]
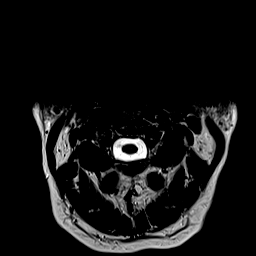

[Series 22: T1 · axial · non-contrast · 3.0mm · 0.35mm/px · z∈[-106,-15]mm · 8 of 30 slices shown (2 of 2)]
[im 1/30]
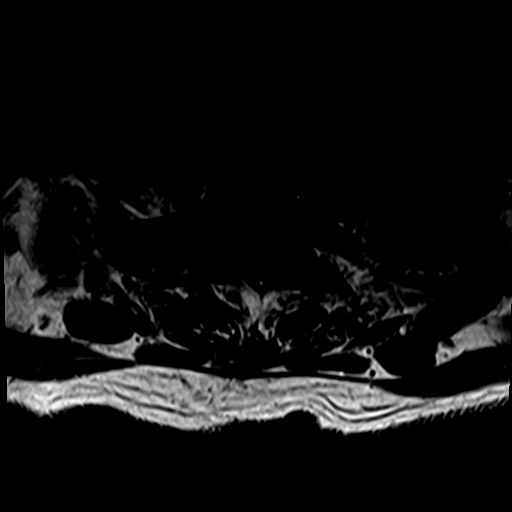
[im 5/30]
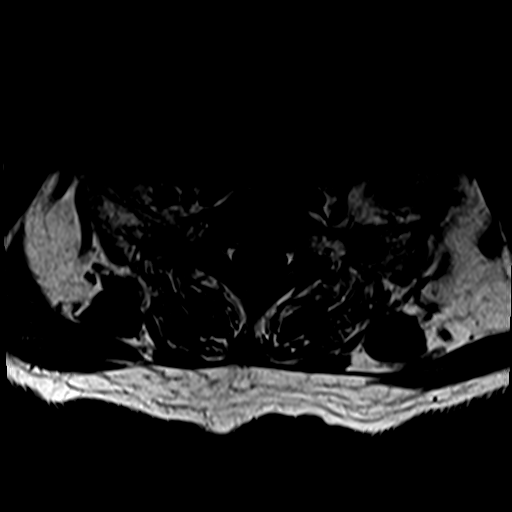
[im 9/30]
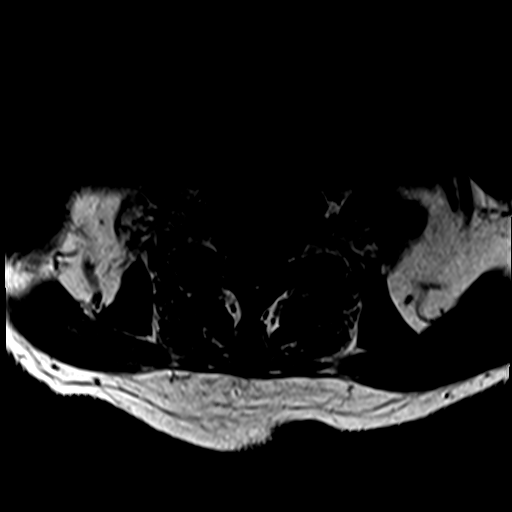
[im 13/30]
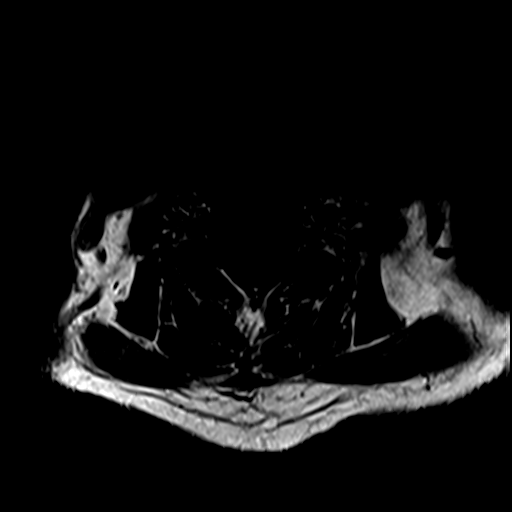
[im 17/30]
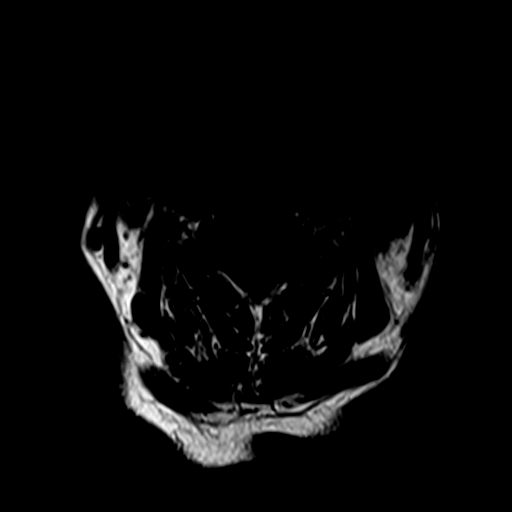
[im 21/30]
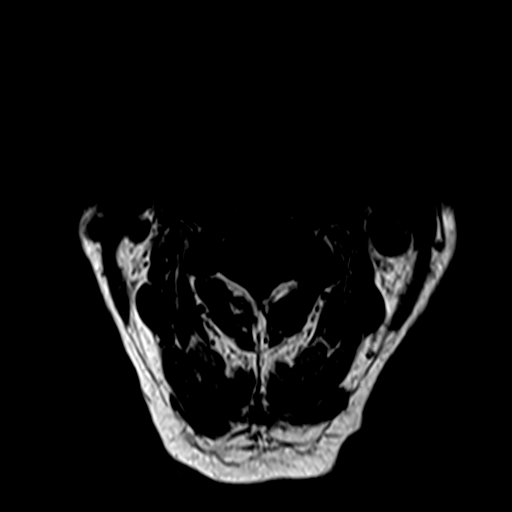
[im 25/30]
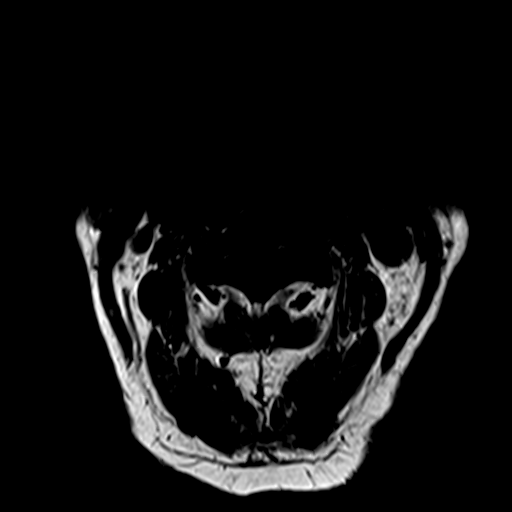
[im 30/30]
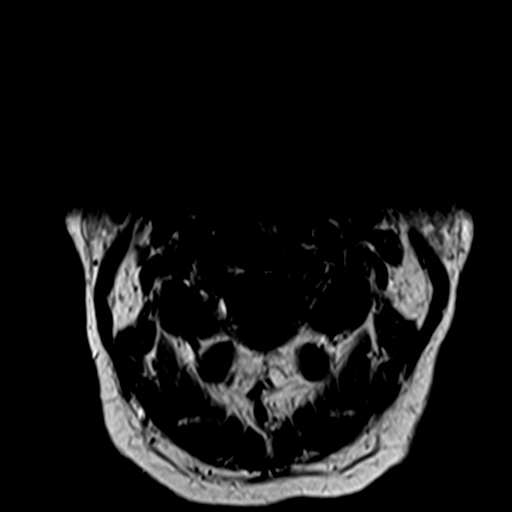

[Series 24: T1 post-contrast · axial · 3.0mm · 0.35mm/px · z∈[-106,-94]mm · 2 of 30 slices shown]
[im 1/30]
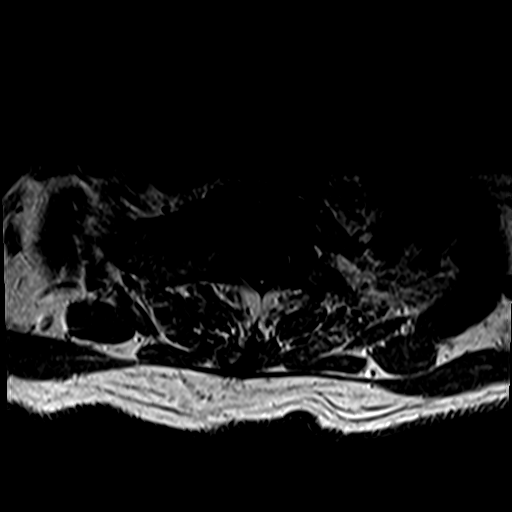
[im 5/30]
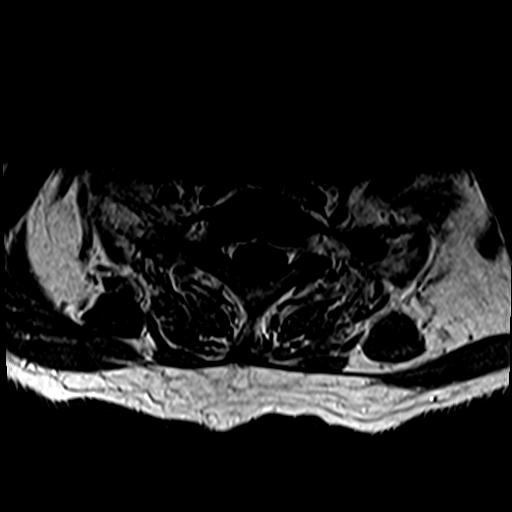

[26 of 48 positions shown; findings below may reference images not displayed]

FINDINGS: MRI CERVICAL SPINE FINDINGS

Alignment: Straightening of the cervical curvature.

Vertebrae: No fracture, evidence of discitis, or bone lesion.

Cord: Mass effect on the cord with flattening at the C4-5 level. No
cord signal abnormality.

Posterior Fossa, vertebral arteries, paraspinal tissues: Negative.

Disc levels:

C2-3: Small posterior disc protrusion resulting mild spinal canal
stenosis. Facet degenerative changes resulting in moderate right
neural foraminal narrowing.

C3-4: Posterior disc protrusion without significant spinal canal
stenosis. Uncovertebral and facet degenerative changes resulting in
severe left neural foraminal narrowing.

C4-5: Posterior disc protrusion resulting in moderate to severe
spinal canal stenosis with mass effect on the cord without cord
signal abnormality. Uncovertebral and facet degenerative changes
resulting in moderate right and severe left neural foraminal
narrowing.

C5-6: Posterior disc protrusion resulting in mild spinal canal
stenosis. Uncovertebral and facet degenerative changes resulting in
severe right and mild left neural foraminal narrowing.

C6-7: Posterior disc protrusion resulting in moderate spinal canal
stenosis. Uncovertebral and facet degenerative changes resulting in
severe bilateral neural foraminal narrowing.

C7-T1: No spinal canal or neural foraminal stenosis.

MRI THORACIC SPINE FINDINGS

Alignment:  Physiologic.

Vertebrae: No fracture, evidence of discitis, or bone lesion.

Cord:  Normal signal and morphology.

Paraspinal and other soft tissues: Small bilateral pleural effusion.

Disc levels:

At T9-10, a left posterolateral disc protrusion, facet degenerative
changes with ligamentum flavum redundancy causes indentations of the
thecal sac without significant spinal canal or neural foraminal
stenosis. No significant disc herniation, spinal canal or neural
foraminal stenosis in the other thoracic levels.

MRI LUMBAR SPINE FINDINGS

Segmentation: A transitional lumbosacral vertebra is assumed to
represent a partially sacralized L5 level with rudimentary disc at
L5-S1. Careful correlation with this numbering strategy prior to any
procedural intervention would be recommended.

Alignment:  Minimal retrolisthesis of L1 over L2 and L3 over L4.

Vertebrae: No fracture, evidence of discitis, or bone lesion. Loss
of disc height with prominent endplate degenerative changes at L1-2
and L4-5.

Conus medullaris and cauda equina: Conus extends to the L1 level.
Conus and cauda equina appear normal.

Paraspinal and other soft tissues: Negative.

Disc levels:

T12-L1: No spinal canal or neural foraminal stenosis.

L1-2: Loss of disc height, disc bulge, moderate facet degenerative
change ligamentum flavum redundancy resulting in mild to moderate
spinal canal stenosis with narrowing of the bilateral subarticular
zones and mild bilateral neural foraminal narrowing.

L2-3: Shallow disc bulge and moderate facet degenerative changes
resulting in mild spinal canal stenosis and mild right neural
foraminal narrowing.

L3-4: Shallow disc bulge with superimposed small central disc
protrusion, moderate facet degenerative change ligamentum flavum
redundancy resulting mild spinal canal stenosis with narrowing of
the bilateral subarticular zones and mild bilateral neural foraminal
narrowing.

L4-5: Loss of disc height, disc bulge with associated osteophytic
component and moderate facet degenerative changes resulting mild
narrowing of the bilateral subarticular zones and moderate bilateral
neural foraminal narrowing.

L5-S1: No spinal canal or neural foraminal stenosis.
IMPRESSION: 1. No evidence of discitis-osteomyelitis.
2. Multilevel degenerative changes of the cervical spine with
moderate to severe spinal canal stenosis at C4-5 with mass effect on
the cord without cord signal abnormality. Moderate spinal canal
stenosis at C6-7.
3. No significant spinal canal or neural foraminal stenosis at the
thoracic levels.
4. Transitional lumbosacral vertebra is assumed to represent a
partially sacralized L5 level with rudimentary disc at L5-S1.
Careful correlation with this numbering strategy prior to any
procedural intervention would be recommended.
5. Multilevel degenerative changes of the lumbar spine with
mild-to-moderate spinal canal stenosis at L1-2 and mild spinal canal
stenosis at L2-3 and L3-4. There is narrowing of the bilateral
subarticular zones at L1-2 and L4-5 with mild bilateral neural
foraminal narrowing.
6. Small bilateral pleural effusion.

## 2020-11-18 IMAGING — MR MR THORACIC SPINE WO/W CM
6 of 8 series · 29 of 48 positions shown · IV contrast (gadavist)
Comparison: None.

CLINICAL DATA: Cervical radiculopathy, infection suspected.

Epidural abscess.
Lumbar radiculopathy, cancer or infection suspected.
EXAM:
MRI CERVICAL, THORACIC AND LUMBAR SPINE WITHOUT AND WITH CONTRAST
TECHNIQUE: Multiplanar and multiecho pulse sequences of the cervical spine, to
include the craniocervical junction and cervicothoracic junction,
and thoracic and lumbar spine, were obtained without and with
intravenous contrast.
CONTRAST:  8mL GADAVIST GADOBUTROL 1 MMOL/ML IV SOLN

[Series 16: T2 · sagittal · 3.0mm · 1.33mm/px · 4 of 17 slices shown (1 of 2)]
[im 1/17]
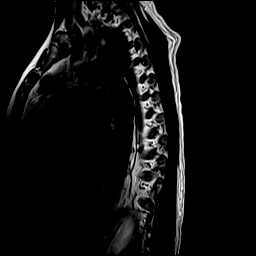
[im 6/17]
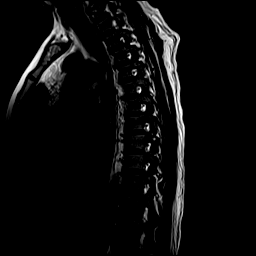
[im 11/17]
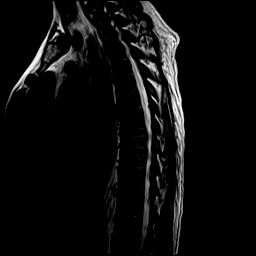
[im 17/17]
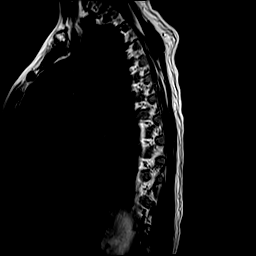

[Series 17: T1 · sagittal · 3.0mm · 1.33mm/px · 4 of 17 slices shown]
[im 1/17]
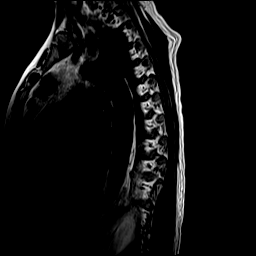
[im 6/17]
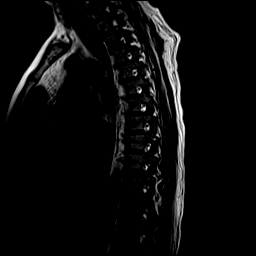
[im 11/17]
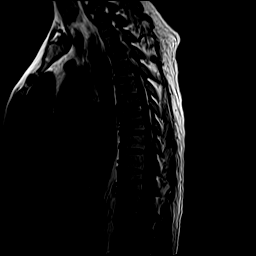
[im 17/17]
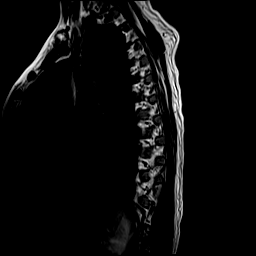

[Series 18: STIR · sagittal · 3.0mm · 0.66mm/px · 4 of 17 slices shown]
[im 1/17]
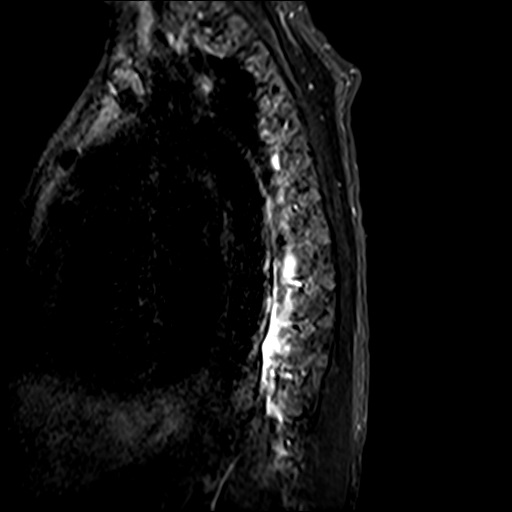
[im 6/17]
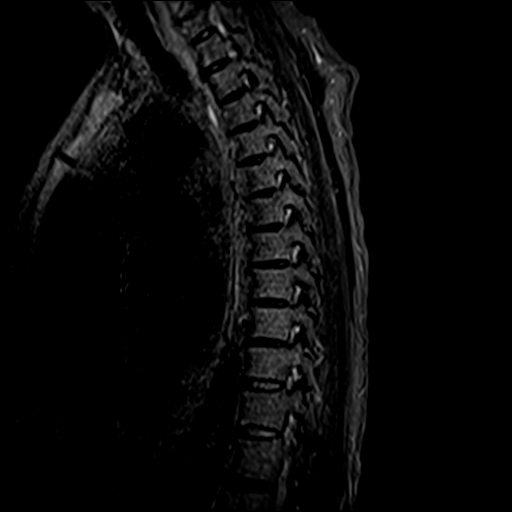
[im 11/17]
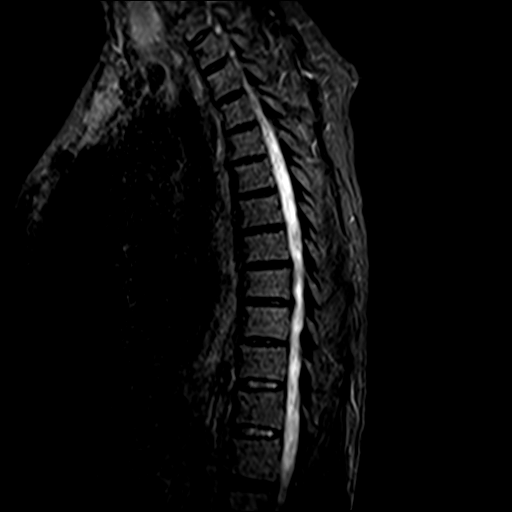
[im 17/17]
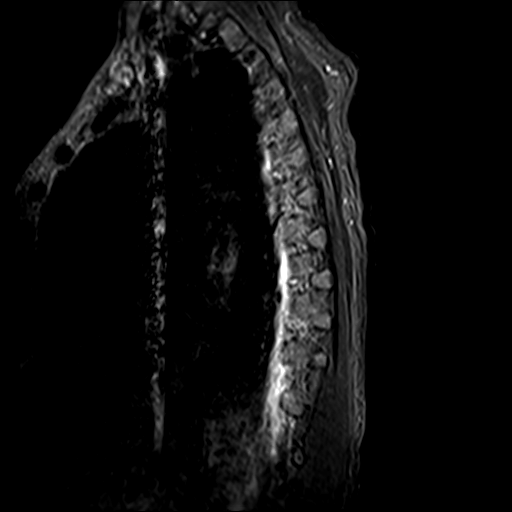

[Series 19: T2 · axial · 4.0mm · 0.59mm/px · z∈[-368,-105]mm · 8 of 39 slices shown (2 of 2)]
[im 1/39]
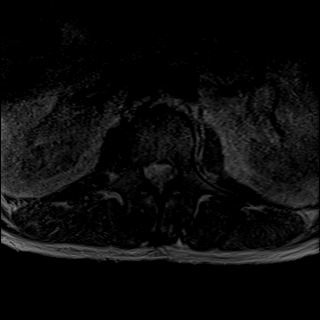
[im 6/39]
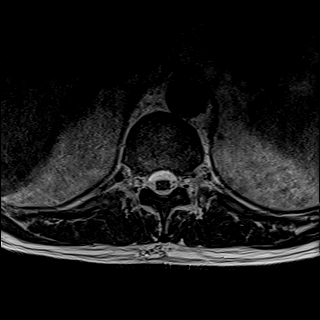
[im 11/39]
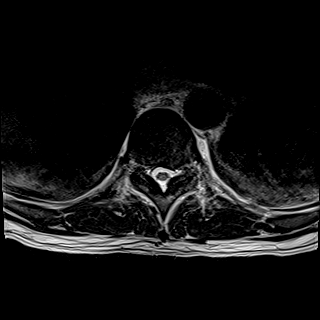
[im 17/39]
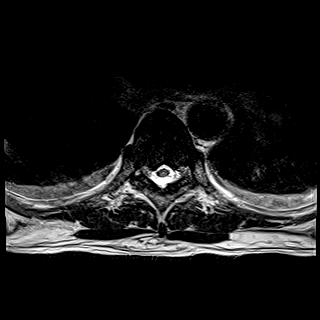
[im 22/39]
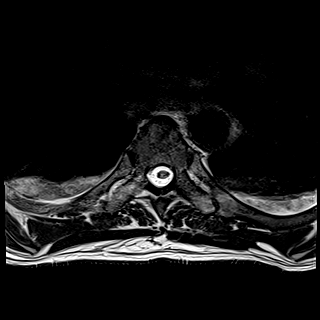
[im 28/39]
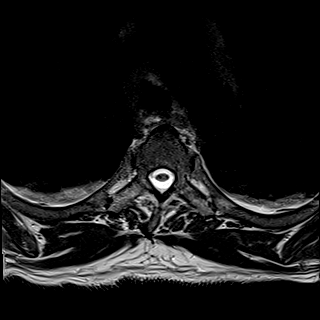
[im 33/39]
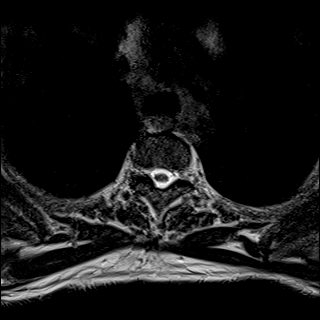
[im 39/39]
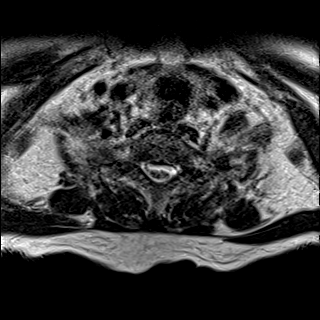

[Series 21: T1 post-contrast · axial · non-contrast · 4.0mm · 0.37mm/px · z∈[-368,-194]mm · 5 of 39 slices shown]
[im 1/39]
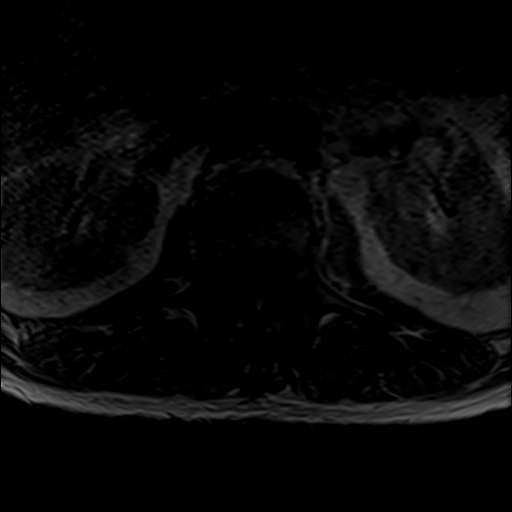
[im 6/39]
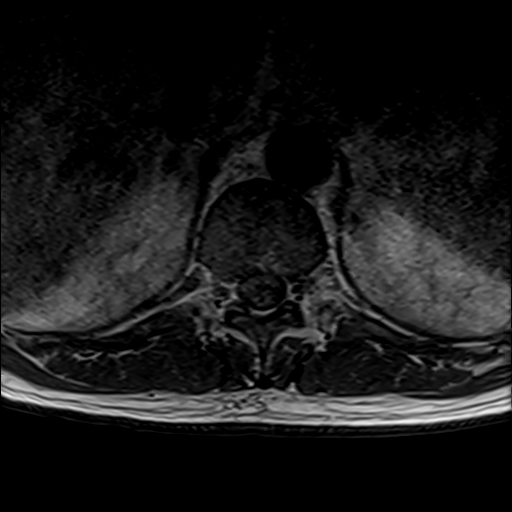
[im 11/39]
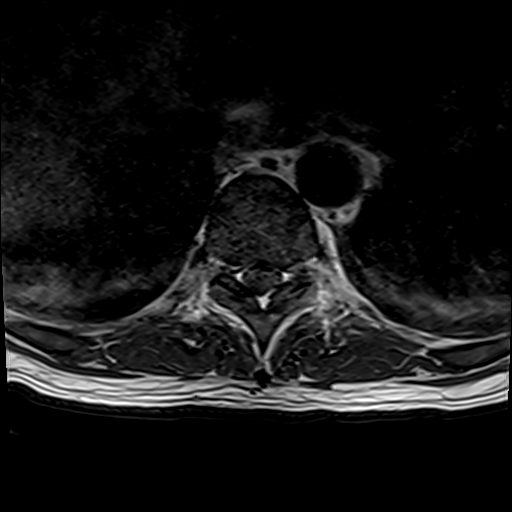
[im 17/39]
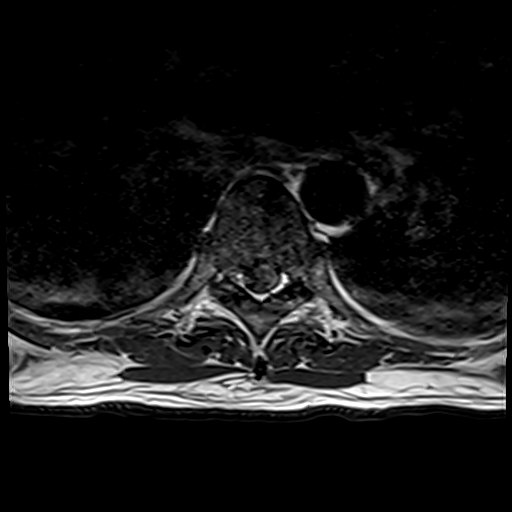
[im 22/39]
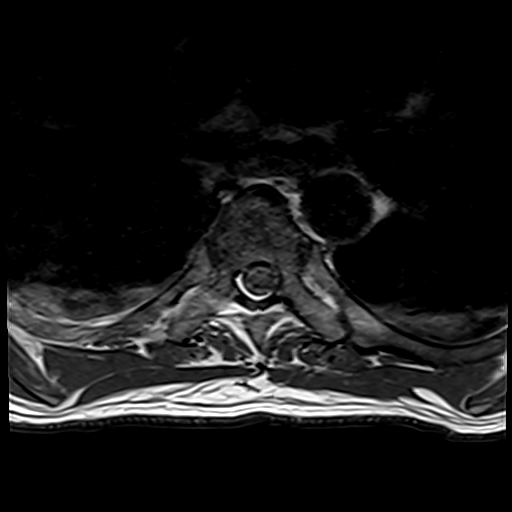

[Series 22: T1 fat-sat post-contrast · sagittal · 3.0mm · 1.33mm/px · 4 of 17 slices shown]
[im 1/17]
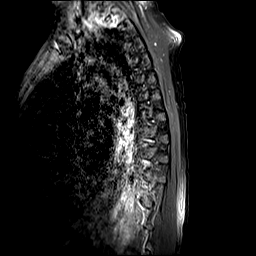
[im 6/17]
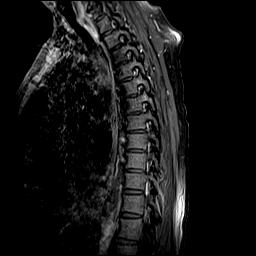
[im 11/17]
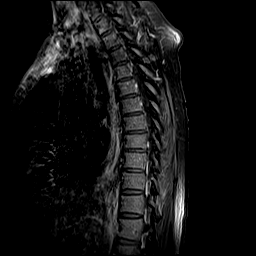
[im 17/17]
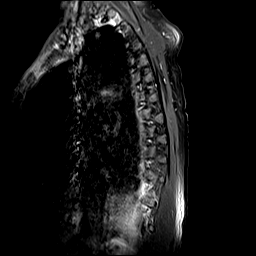

[29 of 48 positions shown; findings below may reference images not displayed]

FINDINGS: MRI CERVICAL SPINE FINDINGS

Alignment: Straightening of the cervical curvature.

Vertebrae: No fracture, evidence of discitis, or bone lesion.

Cord: Mass effect on the cord with flattening at the C4-5 level. No
cord signal abnormality.

Posterior Fossa, vertebral arteries, paraspinal tissues: Negative.

Disc levels:

C2-3: Small posterior disc protrusion resulting mild spinal canal
stenosis. Facet degenerative changes resulting in moderate right
neural foraminal narrowing.

C3-4: Posterior disc protrusion without significant spinal canal
stenosis. Uncovertebral and facet degenerative changes resulting in
severe left neural foraminal narrowing.

C4-5: Posterior disc protrusion resulting in moderate to severe
spinal canal stenosis with mass effect on the cord without cord
signal abnormality. Uncovertebral and facet degenerative changes
resulting in moderate right and severe left neural foraminal
narrowing.

C5-6: Posterior disc protrusion resulting in mild spinal canal
stenosis. Uncovertebral and facet degenerative changes resulting in
severe right and mild left neural foraminal narrowing.

C6-7: Posterior disc protrusion resulting in moderate spinal canal
stenosis. Uncovertebral and facet degenerative changes resulting in
severe bilateral neural foraminal narrowing.

C7-T1: No spinal canal or neural foraminal stenosis.

MRI THORACIC SPINE FINDINGS

Alignment:  Physiologic.

Vertebrae: No fracture, evidence of discitis, or bone lesion.

Cord:  Normal signal and morphology.

Paraspinal and other soft tissues: Small bilateral pleural effusion.

Disc levels:

At T9-10, a left posterolateral disc protrusion, facet degenerative
changes with ligamentum flavum redundancy causes indentations of the
thecal sac without significant spinal canal or neural foraminal
stenosis. No significant disc herniation, spinal canal or neural
foraminal stenosis in the other thoracic levels.

MRI LUMBAR SPINE FINDINGS

Segmentation: A transitional lumbosacral vertebra is assumed to
represent a partially sacralized L5 level with rudimentary disc at
L5-S1. Careful correlation with this numbering strategy prior to any
procedural intervention would be recommended.

Alignment:  Minimal retrolisthesis of L1 over L2 and L3 over L4.

Vertebrae: No fracture, evidence of discitis, or bone lesion. Loss
of disc height with prominent endplate degenerative changes at L1-2
and L4-5.

Conus medullaris and cauda equina: Conus extends to the L1 level.
Conus and cauda equina appear normal.

Paraspinal and other soft tissues: Negative.

Disc levels:

T12-L1: No spinal canal or neural foraminal stenosis.

L1-2: Loss of disc height, disc bulge, moderate facet degenerative
change ligamentum flavum redundancy resulting in mild to moderate
spinal canal stenosis with narrowing of the bilateral subarticular
zones and mild bilateral neural foraminal narrowing.

L2-3: Shallow disc bulge and moderate facet degenerative changes
resulting in mild spinal canal stenosis and mild right neural
foraminal narrowing.

L3-4: Shallow disc bulge with superimposed small central disc
protrusion, moderate facet degenerative change ligamentum flavum
redundancy resulting mild spinal canal stenosis with narrowing of
the bilateral subarticular zones and mild bilateral neural foraminal
narrowing.

L4-5: Loss of disc height, disc bulge with associated osteophytic
component and moderate facet degenerative changes resulting mild
narrowing of the bilateral subarticular zones and moderate bilateral
neural foraminal narrowing.

L5-S1: No spinal canal or neural foraminal stenosis.
IMPRESSION: 1. No evidence of discitis-osteomyelitis.
2. Multilevel degenerative changes of the cervical spine with
moderate to severe spinal canal stenosis at C4-5 with mass effect on
the cord without cord signal abnormality. Moderate spinal canal
stenosis at C6-7.
3. No significant spinal canal or neural foraminal stenosis at the
thoracic levels.
4. Transitional lumbosacral vertebra is assumed to represent a
partially sacralized L5 level with rudimentary disc at L5-S1.
Careful correlation with this numbering strategy prior to any
procedural intervention would be recommended.
5. Multilevel degenerative changes of the lumbar spine with
mild-to-moderate spinal canal stenosis at L1-2 and mild spinal canal
stenosis at L2-3 and L3-4. There is narrowing of the bilateral
subarticular zones at L1-2 and L4-5 with mild bilateral neural
foraminal narrowing.
6. Small bilateral pleural effusion.

## 2020-11-18 IMAGING — CT CT ABD-PELV W/ CM
2 of 5 series · 16 of 46 positions shown, 18 images · IV contrast (APPLIED)
Comparison: [DATE].

CLINICAL DATA: Abdominal abscess.

EXAM:
CT ABDOMEN AND PELVIS WITH CONTRAST
TECHNIQUE: Multidetector CT imaging of the abdomen and pelvis was performed
using the standard protocol following bolus administration of
intravenous contrast.
CONTRAST:  100mL OMNIPAQUE IOHEXOL 300 MG/ML  SOLN

[Series 2: routine abd/pel with · axial · 0.79mm/px · z∈[-672,-246]mm · 13 of 97 slices shown, 15 images]
[im 6/97  soft-tissue]
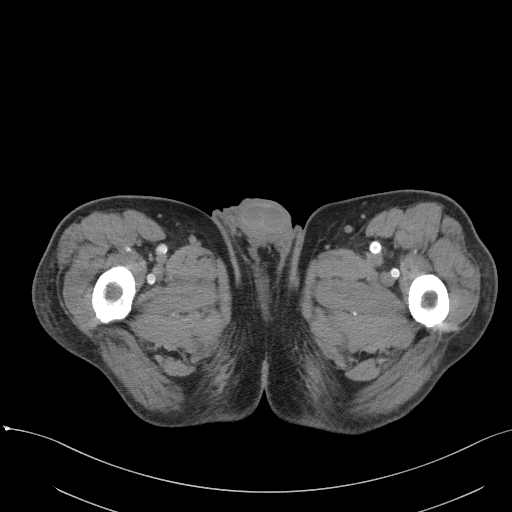
[im 6/97  bone]
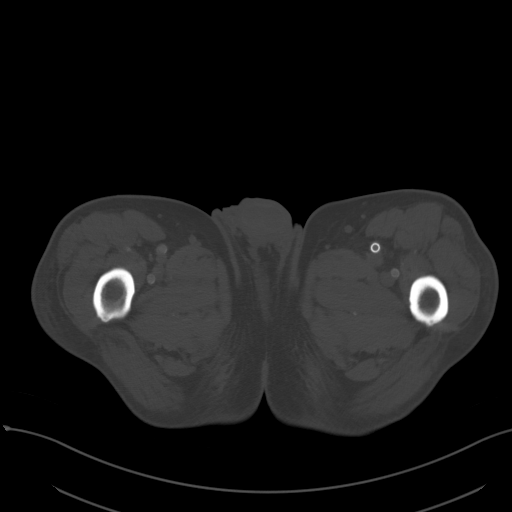
[im 16/97  soft-tissue]
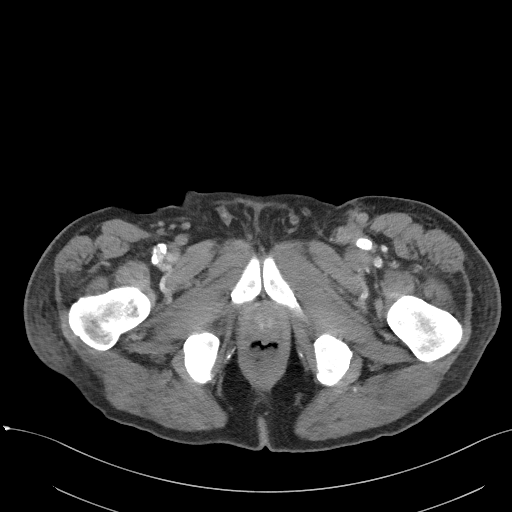
[im 21/97  soft-tissue]
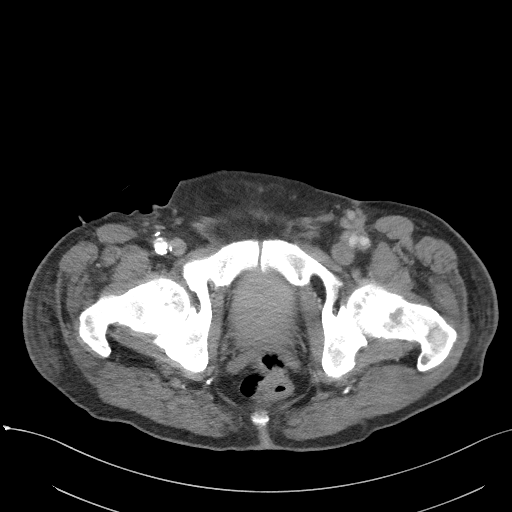
[im 26/97  soft-tissue]
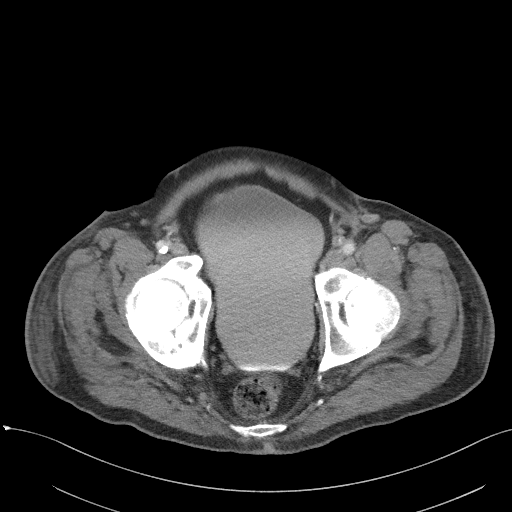
[im 36/97  soft-tissue]
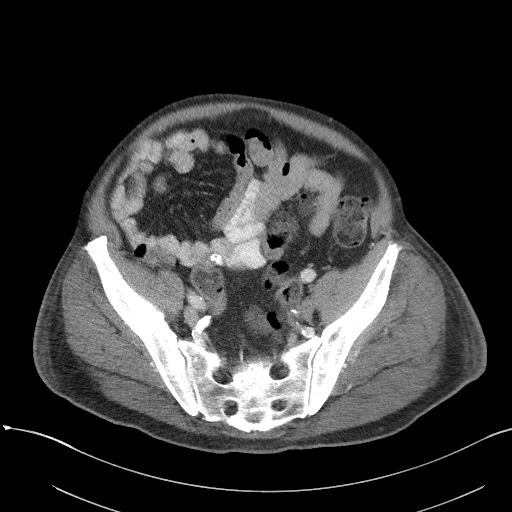
[im 41/97  soft-tissue]
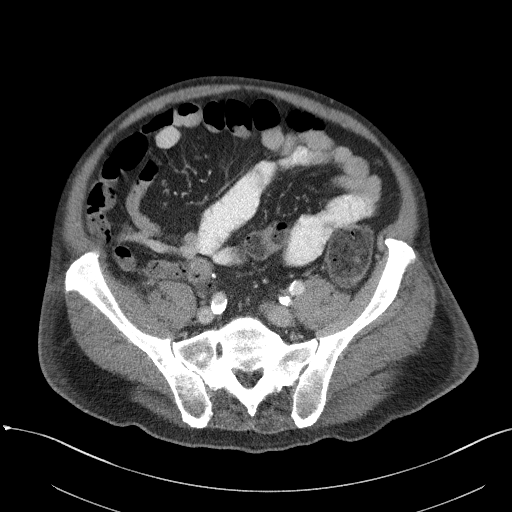
[im 51/97  soft-tissue]
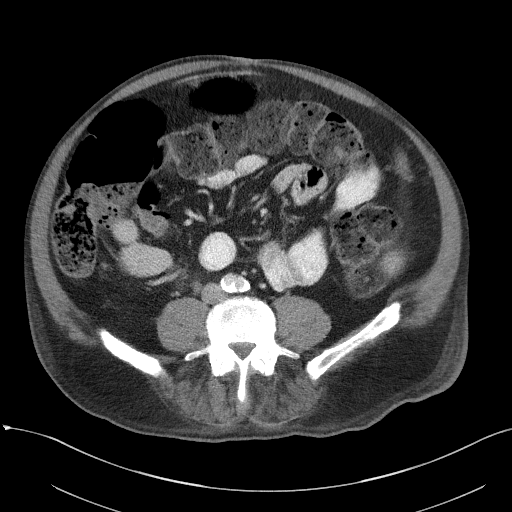
[im 56/97  soft-tissue]
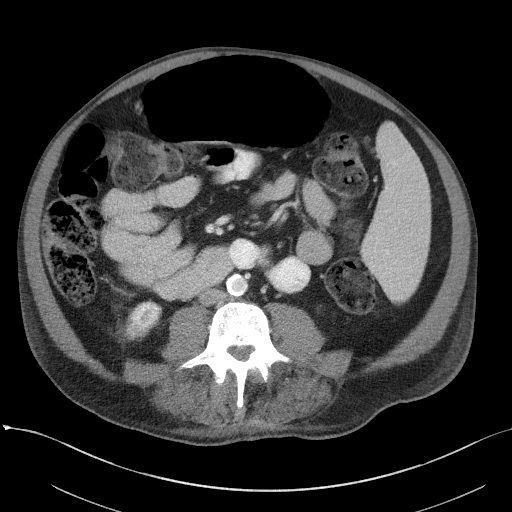
[im 61/97  soft-tissue]
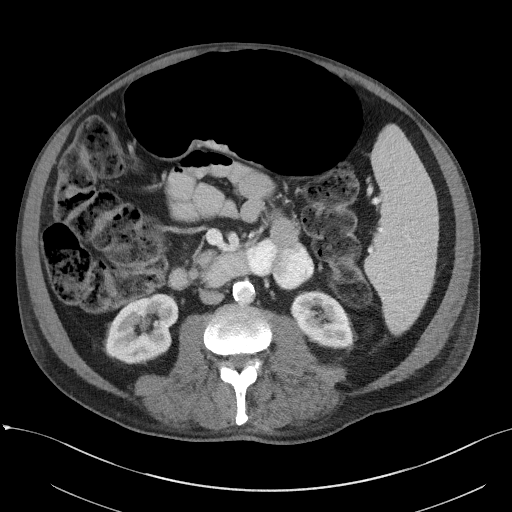
[im 61/97  bone]
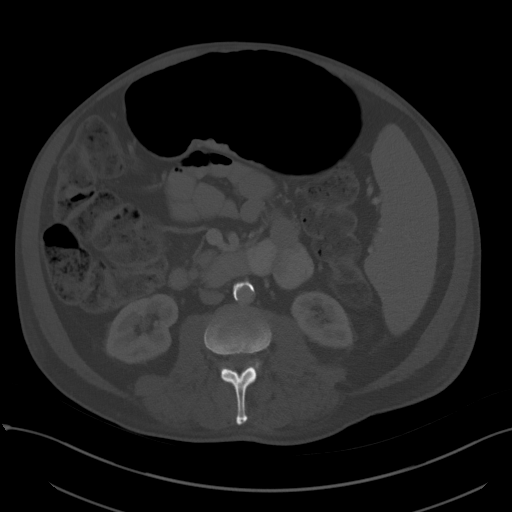
[im 71/97  soft-tissue]
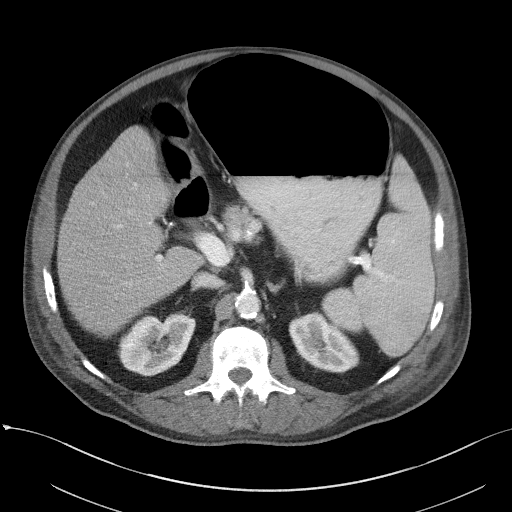
[im 76/97  soft-tissue]
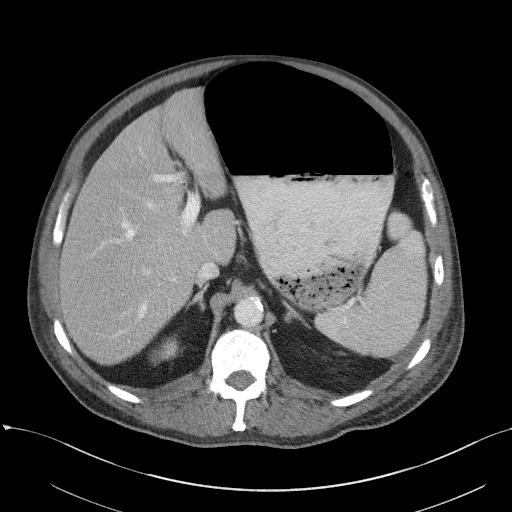
[im 81/97  soft-tissue]
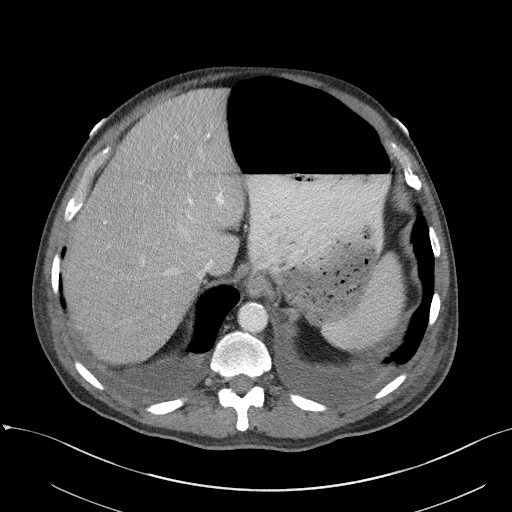
[im 91/97  soft-tissue]
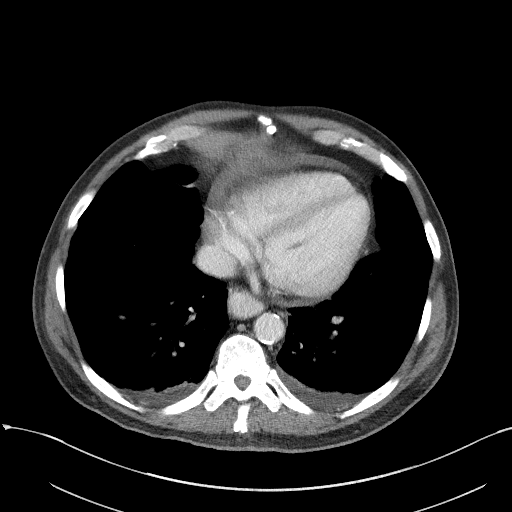

[Series 5: coronal st · coronal · 0.75mm/px · 3 of 110 slices shown]
[im 37/110  soft-tissue]
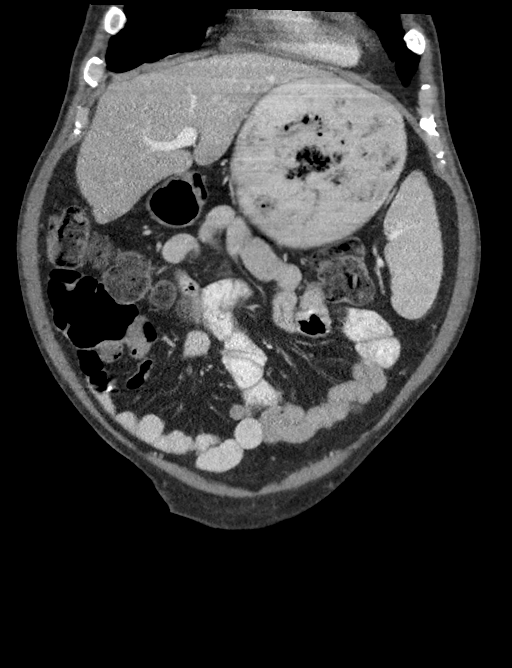
[im 49/110  soft-tissue]
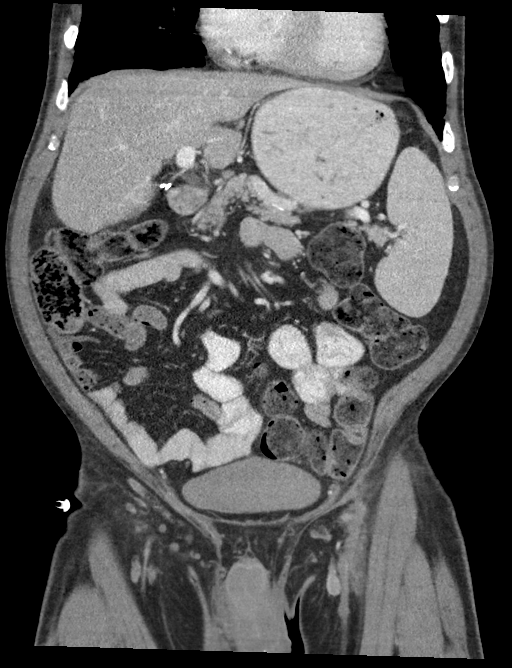
[im 61/110  soft-tissue]
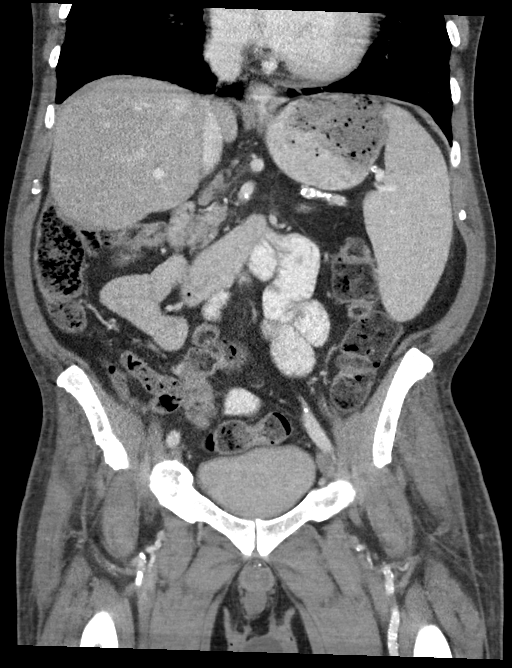

[16 of 46 positions shown; findings below may reference images not displayed]

FINDINGS: Lower chest: Small bilateral pleural effusions are noted with
adjacent subsegmental atelectasis.

Hepatobiliary: No focal liver abnormality is seen. Status post
cholecystectomy. No biliary dilatation.

Pancreas: Unremarkable. No pancreatic ductal dilatation or
surrounding inflammatory changes.

Spleen: Normal in size without focal abnormality.

Adrenals/Urinary Tract: Adrenal glands are unremarkable. Kidneys are
normal, without renal calculi, focal lesion, or hydronephrosis. Mild
urinary bladder distention is noted.

Stomach/Bowel: Moderate gastric distention is noted. Stool is noted
throughout the colon. No large or small bowel dilatation is noted.
No definite inflammation is noted. The appendix is unremarkable.

Vascular/Lymphatic: Aortic atherosclerosis. Bilateral inguinal
adenopathy is noted which may be inflammatory or reactive in
etiology.

Reproductive: Prostate is unremarkable.

Other: Scrotal hydrocele is noted.

Musculoskeletal: Degenerative disc disease is noted at L2-3 and
L5-S1. No acute abnormality is noted.
IMPRESSION: 1. Moderate gastric distention is noted. Stool is noted throughout
the colon.
2. Small bilateral pleural effusions are noted with adjacent
subsegmental atelectasis.
3. Aortic atherosclerosis.
4. Bilateral inguinal adenopathy is noted which may be inflammatory
or reactive in etiology.
5. Mild urinary bladder distention is noted.
6. Scrotal hydrocele is noted.

Aortic Atherosclerosis ([TY]-[TY]).

## 2020-11-18 IMAGING — MR MR LUMBAR SPINE WO/W CM
6 of 7 series · 31 of 48 positions shown · IV contrast (gadavist)
Comparison: None.

CLINICAL DATA: Cervical radiculopathy, infection suspected.

Epidural abscess.
Lumbar radiculopathy, cancer or infection suspected.
EXAM:
MRI CERVICAL, THORACIC AND LUMBAR SPINE WITHOUT AND WITH CONTRAST
TECHNIQUE: Multiplanar and multiecho pulse sequences of the cervical spine, to
include the craniocervical junction and cervicothoracic junction,
and thoracic and lumbar spine, were obtained without and with
intravenous contrast.
CONTRAST:  8mL GADAVIST GADOBUTROL 1 MMOL/ML IV SOLN

[Series 16: T2 · sagittal · 4.0mm · 1.02mm/px · 5 of 16 slices shown (1 of 2)]
[im 1/16]
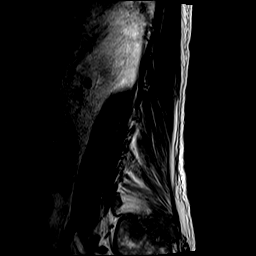
[im 4/16]
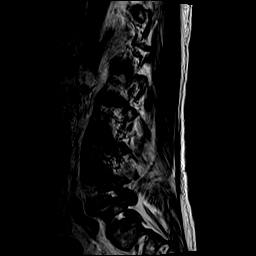
[im 8/16]
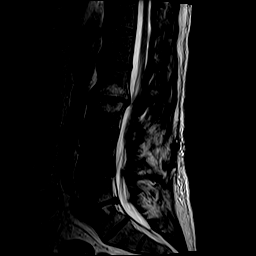
[im 12/16]
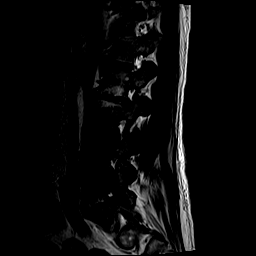
[im 16/16]
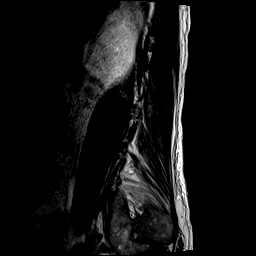

[Series 17: T1 · sagittal · 4.0mm · 1.02mm/px · 5 of 16 slices shown (1 of 2)]
[im 1/16]
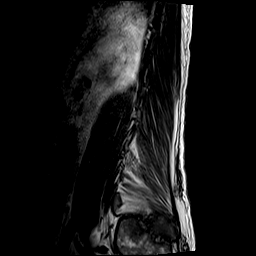
[im 4/16]
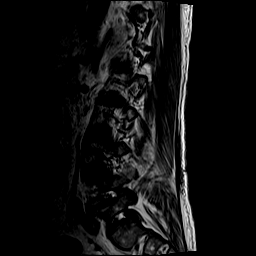
[im 8/16]
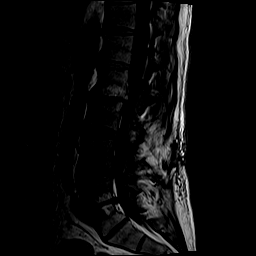
[im 12/16]
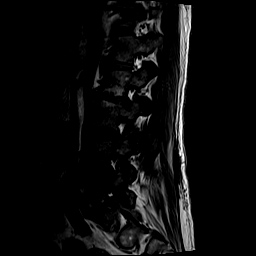
[im 16/16]
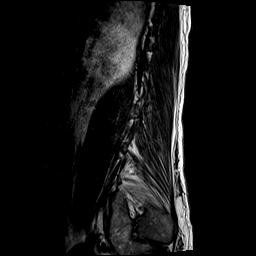

[Series 18: STIR · sagittal · 4.0mm · 0.51mm/px · 1 of 16 slices shown]
[im 1/16]
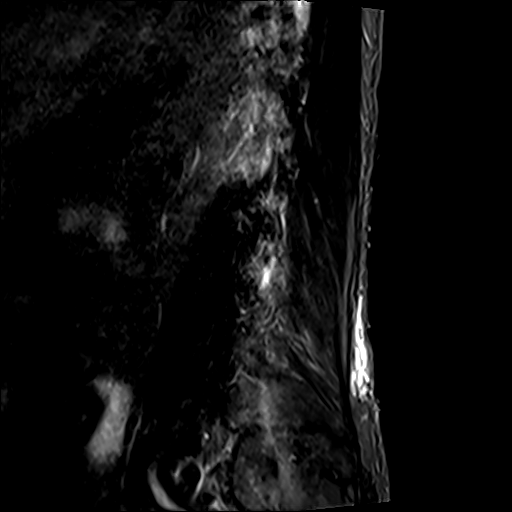

[Series 19: T2 · axial · 4.0mm · 0.78mm/px · z∈[-531,-320]mm · 8 of 36 slices shown (2 of 2)]
[im 1/36]
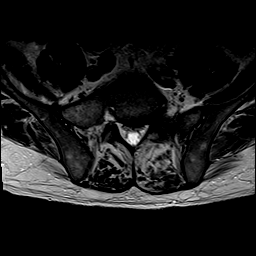
[im 4/36]
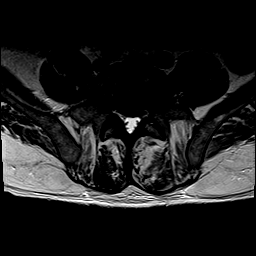
[im 12/36]
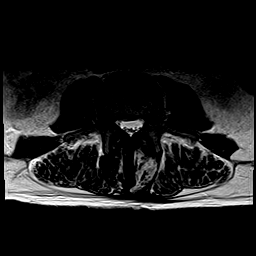
[im 16/36]
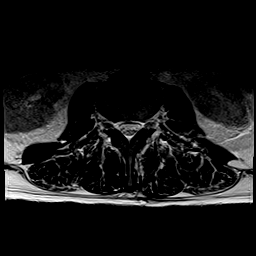
[im 20/36]
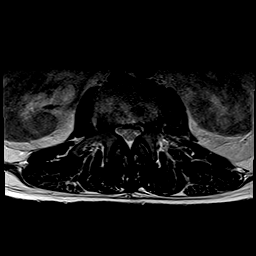
[im 24/36]
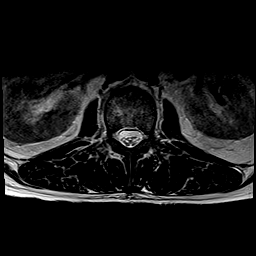
[im 32/36]
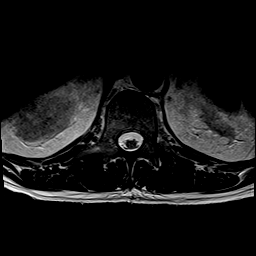
[im 36/36]
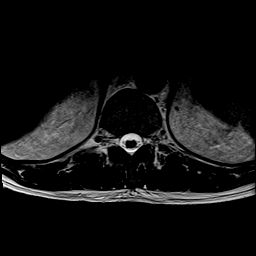

[Series 20: T1 · axial · 4.0mm · 0.39mm/px · z∈[-531,-320]mm · 8 of 36 slices shown (2 of 2)]
[im 1/36]
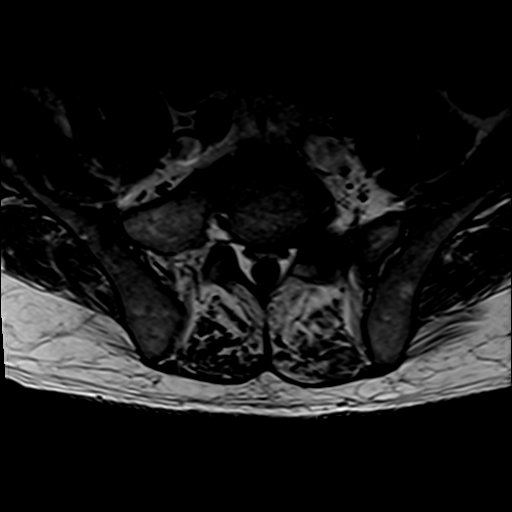
[im 4/36]
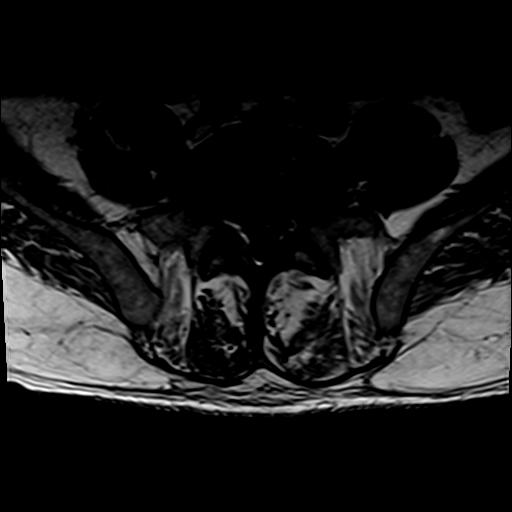
[im 12/36]
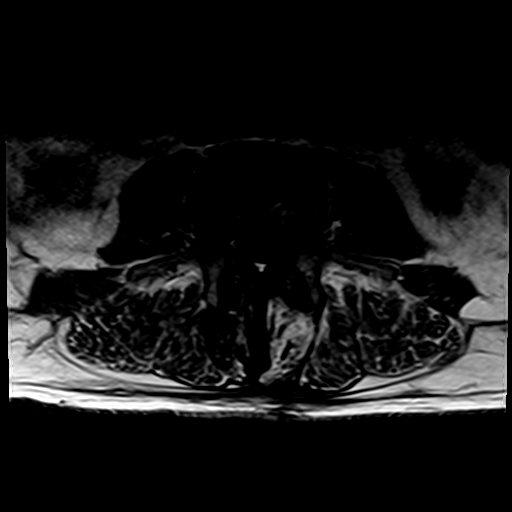
[im 16/36]
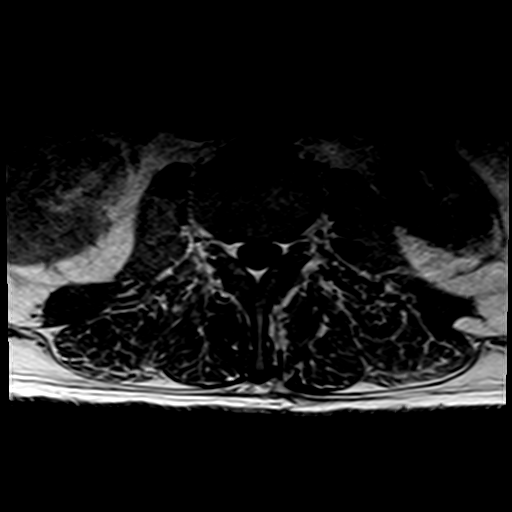
[im 20/36]
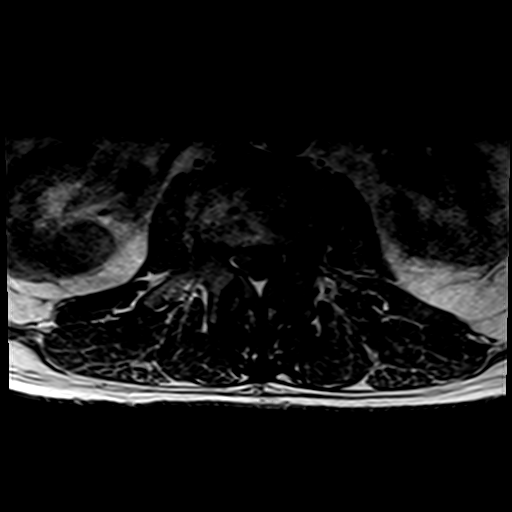
[im 24/36]
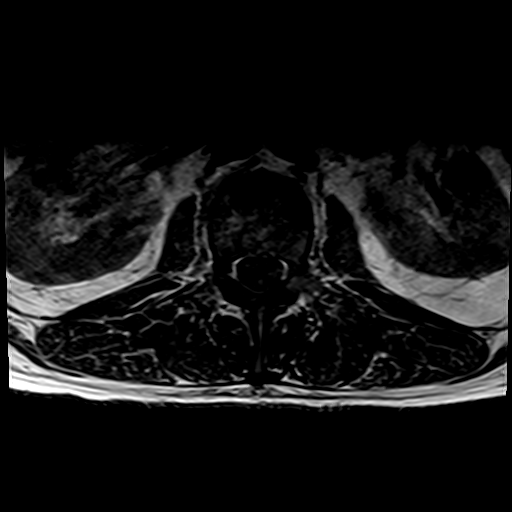
[im 32/36]
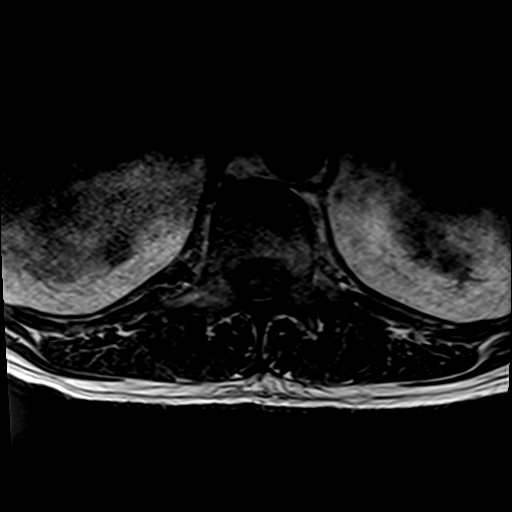
[im 36/36]
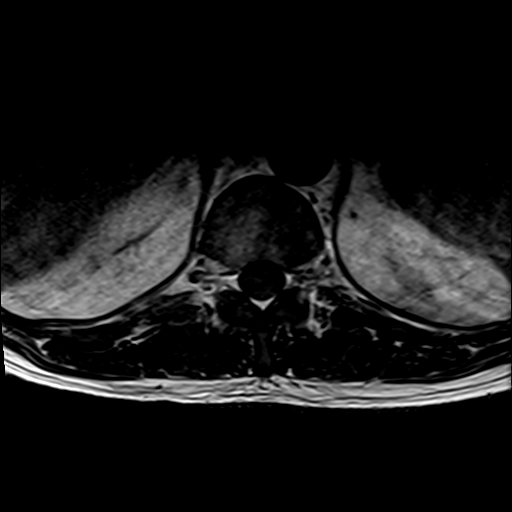

[Series 21: T1 fat-sat post-contrast · sagittal · 4.0mm · 1.02mm/px · 4 of 16 slices shown]
[im 1/16]
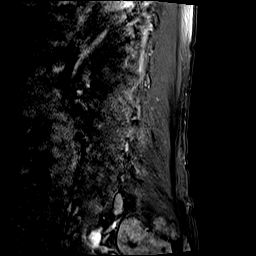
[im 6/16]
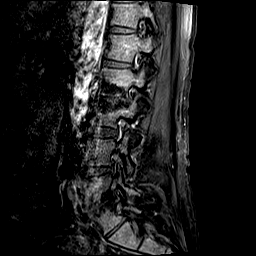
[im 11/16]
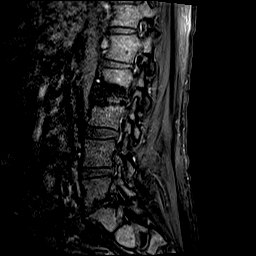
[im 16/16]
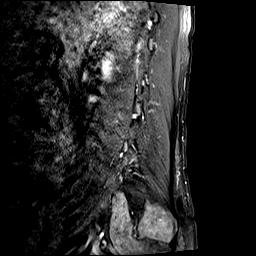

[31 of 48 positions shown; findings below may reference images not displayed]

FINDINGS: MRI CERVICAL SPINE FINDINGS

Alignment: Straightening of the cervical curvature.

Vertebrae: No fracture, evidence of discitis, or bone lesion.

Cord: Mass effect on the cord with flattening at the C4-5 level. No
cord signal abnormality.

Posterior Fossa, vertebral arteries, paraspinal tissues: Negative.

Disc levels:

C2-3: Small posterior disc protrusion resulting mild spinal canal
stenosis. Facet degenerative changes resulting in moderate right
neural foraminal narrowing.

C3-4: Posterior disc protrusion without significant spinal canal
stenosis. Uncovertebral and facet degenerative changes resulting in
severe left neural foraminal narrowing.

C4-5: Posterior disc protrusion resulting in moderate to severe
spinal canal stenosis with mass effect on the cord without cord
signal abnormality. Uncovertebral and facet degenerative changes
resulting in moderate right and severe left neural foraminal
narrowing.

C5-6: Posterior disc protrusion resulting in mild spinal canal
stenosis. Uncovertebral and facet degenerative changes resulting in
severe right and mild left neural foraminal narrowing.

C6-7: Posterior disc protrusion resulting in moderate spinal canal
stenosis. Uncovertebral and facet degenerative changes resulting in
severe bilateral neural foraminal narrowing.

C7-T1: No spinal canal or neural foraminal stenosis.

MRI THORACIC SPINE FINDINGS

Alignment:  Physiologic.

Vertebrae: No fracture, evidence of discitis, or bone lesion.

Cord:  Normal signal and morphology.

Paraspinal and other soft tissues: Small bilateral pleural effusion.

Disc levels:

At T9-10, a left posterolateral disc protrusion, facet degenerative
changes with ligamentum flavum redundancy causes indentations of the
thecal sac without significant spinal canal or neural foraminal
stenosis. No significant disc herniation, spinal canal or neural
foraminal stenosis in the other thoracic levels.

MRI LUMBAR SPINE FINDINGS

Segmentation: A transitional lumbosacral vertebra is assumed to
represent a partially sacralized L5 level with rudimentary disc at
L5-S1. Careful correlation with this numbering strategy prior to any
procedural intervention would be recommended.

Alignment:  Minimal retrolisthesis of L1 over L2 and L3 over L4.

Vertebrae: No fracture, evidence of discitis, or bone lesion. Loss
of disc height with prominent endplate degenerative changes at L1-2
and L4-5.

Conus medullaris and cauda equina: Conus extends to the L1 level.
Conus and cauda equina appear normal.

Paraspinal and other soft tissues: Negative.

Disc levels:

T12-L1: No spinal canal or neural foraminal stenosis.

L1-2: Loss of disc height, disc bulge, moderate facet degenerative
change ligamentum flavum redundancy resulting in mild to moderate
spinal canal stenosis with narrowing of the bilateral subarticular
zones and mild bilateral neural foraminal narrowing.

L2-3: Shallow disc bulge and moderate facet degenerative changes
resulting in mild spinal canal stenosis and mild right neural
foraminal narrowing.

L3-4: Shallow disc bulge with superimposed small central disc
protrusion, moderate facet degenerative change ligamentum flavum
redundancy resulting mild spinal canal stenosis with narrowing of
the bilateral subarticular zones and mild bilateral neural foraminal
narrowing.

L4-5: Loss of disc height, disc bulge with associated osteophytic
component and moderate facet degenerative changes resulting mild
narrowing of the bilateral subarticular zones and moderate bilateral
neural foraminal narrowing.

L5-S1: No spinal canal or neural foraminal stenosis.
IMPRESSION: 1. No evidence of discitis-osteomyelitis.
2. Multilevel degenerative changes of the cervical spine with
moderate to severe spinal canal stenosis at C4-5 with mass effect on
the cord without cord signal abnormality. Moderate spinal canal
stenosis at C6-7.
3. No significant spinal canal or neural foraminal stenosis at the
thoracic levels.
4. Transitional lumbosacral vertebra is assumed to represent a
partially sacralized L5 level with rudimentary disc at L5-S1.
Careful correlation with this numbering strategy prior to any
procedural intervention would be recommended.
5. Multilevel degenerative changes of the lumbar spine with
mild-to-moderate spinal canal stenosis at L1-2 and mild spinal canal
stenosis at L2-3 and L3-4. There is narrowing of the bilateral
subarticular zones at L1-2 and L4-5 with mild bilateral neural
foraminal narrowing.
6. Small bilateral pleural effusion.

## 2020-11-18 MED ORDER — IOHEXOL 9 MG/ML PO SOLN
500.0000 mL | ORAL | Status: AC
  Administered 2020-11-18 (×2): 500 mL via ORAL

## 2020-11-18 MED ORDER — METRONIDAZOLE 500 MG PO TABS
500.0000 mg | ORAL_TABLET | ORAL | Status: DC
  Administered 2020-11-18 – 2020-11-24 (×17): 500 mg via ORAL
  Filled 2020-11-18 (×21): qty 1

## 2020-11-18 MED ORDER — DOCUSATE SODIUM 100 MG PO CAPS
100.0000 mg | ORAL_CAPSULE | Freq: Two times a day (BID) | ORAL | Status: DC
Start: 2020-11-18 — End: 2020-11-25
  Administered 2020-11-18 – 2020-11-24 (×11): 100 mg via ORAL
  Filled 2020-11-18 (×11): qty 1

## 2020-11-18 MED ORDER — GADOBUTROL 1 MMOL/ML IV SOLN
8.0000 mL | Freq: Once | INTRAVENOUS | Status: AC | PRN
  Administered 2020-11-18: 8 mL via INTRAVENOUS

## 2020-11-18 MED ORDER — IOHEXOL 300 MG/ML  SOLN
100.0000 mL | Freq: Once | INTRAMUSCULAR | Status: AC | PRN
  Administered 2020-11-18: 100 mL via INTRAVENOUS

## 2020-11-18 NOTE — Progress Notes (Signed)
Occupational Therapy Treatment Patient Details Name: [name] MRN: [id number] DOB: 1951-06-28 Today's Date: 11/18/2020    History of present illness [name] is a 70 y.o. male with medical history significant for hypertension, nonischemic cardiomyopathy, non-insulin-dependent DM, peripheral neuropathy, and PAD, who presents to the ED following a visit with his PCP for SOB and swelling and pain in L LE. He endorses weakness and shortness of breath with exertion and increased sleeping and poor PO intake. He reports this has been ongoing for the last 2 weeks. He further endorses nausea and diarrhea x 2 weeks, with worsening the in last 2 days prompting evaluation at PCP. Pt reports unchanged baseline dry cough. He denies chest pain, abdominal pain, dysuria, hematuria. He endorses left lower extremity edema that has been ongoing for the last 2 weeks and worsening in the last 2 days. At PCP, he was advised to present to the ED due to his left lower extremity swelling and shortness of breath. Pt reports he was aware of the diabetic foot ulcer on the medial great toe, however he was unaware of the wound on the left heel.   OT comments  Pt seen for OT treatment on this date. Upon arrival to room pt awake, seated upright in bed with step daughter present. Pt agreeable to tx. At baseline, pt takes showers while standing and this date pt initially attempted UB bathing standing sink-side with RW, however pt unable to maintain PWB precautions following MAX verbal cuing. When sitting, pt only required SET-UP/SUPERVISION for UB bathing and dressing. Pt required MIN A for seated LB dressing and CGA for sit>stand LB bathing; one LOB observed during sit>stand transfer, however pt easily able to regain balance following CGA. Pt would benefit from continued skilled OT services to improve safety awareness and maximize recall and carryover of learned techniques for adherence to weightbearing precautions during ADLs and  daily routine. Will continue to follow POC. Discharge recommendation remains appropriate.    Follow Up Recommendations  Home health OT;Supervision - Intermittent    Equipment Recommendations  Tub/shower seat       Precautions / Restrictions Precautions Precautions: Fall Restrictions Weight Bearing Restrictions: Yes LLE Weight Bearing: Partial weight bearing LLE Partial Weight Bearing Percentage or Pounds: toe touch in surgical shoe       Mobility Bed Mobility Overal bed mobility: Modified Independent             General bed mobility comments: Increased time and effort. Required verbal cues to keep weight off L LE heel    Transfers Overall transfer level: Needs assistance Equipment used: Rolling walker (2 wheeled) Transfers: Sit to/from Stand Sit to Stand: Min guard;Supervision         General transfer comment: x3 sit>stand transfer with supervision. One LOB observed, however pt easily able to regain balance following CGA    Balance Overall balance assessment: Needs assistance   Sitting balance-Leahy Scale: Good Sitting balance - Comments: Sitting EOB, reaching outside of BOS to wash LE     Standing balance-Leahy Scale: Fair Standing balance comment: BUE support on RW during functional mobility.                           ADL either performed or assessed with clinical judgement   ADL Overall ADL's : Needs assistance/impaired     Grooming: Wash/dry hands;Set up;Supervision/safety;Sitting;Wash/dry face;Min guard;Standing   Upper Body Bathing: Supervision/ safety;Set up;Sitting Upper Body Bathing Details (indicate cue type and  reason): Initially attempted UB bathing in standing, however pt unable to maintain PWB precautions following MAX verbal cuing Lower Body Bathing: Supervison/ safety;Set up;Min guard;Sit to/from stand Lower Body Bathing Details (indicate cue type and reason): One LOB observed during intial sit>stand transfer, however pt easily  able to regain balance following CGA Upper Body Dressing : Supervision/safety;Set up;Sitting Upper Body Dressing Details (indicate cue type and reason): to don/doff hospital gown Lower Body Dressing: Minimal assistance;Sitting/lateral leans Lower Body Dressing Details (indicate cue type and reason): To don L surgical shoe and adjust R sock; pt able to don/adjust, required assistance for surgical shoe straps             Functional mobility during ADLs: Min guard;Cueing for safety General ADL Comments: Requires MAX verbal cues for adhering to precautions during functional mobility and standing ADLs               Cognition Arousal/Alertness: Awake/alert Behavior During Therapy: WFL for tasks assessed/performed Overall Cognitive Status: Within Functional Limits for tasks assessed                                 General Comments: Pt hyperverbal, tends to interrupt. Requires MAX verbal cues to adhere to weightbearing precautions during standing ADLs and functional mobility with RW                   Pertinent Vitals/ Pain       Pain Assessment: No/denies pain (at rest)         Frequency  Min 1X/week        Progress Toward Goals  OT Goals(current goals can now be found in the care plan section)  Progress towards OT goals: Progressing toward goals  Acute Rehab OT Goals Patient Stated Goal: to go home OT Goal Formulation: With patient Time For Goal Achievement: 11/27/20  Plan Discharge plan remains appropriate;Frequency remains appropriate       AM-PAC OT "6 Clicks" Daily Activity     Outcome Measure   Help from another person eating meals?: None Help from another person taking care of personal grooming?: A Little Help from another person toileting, which includes using toliet, bedpan, or urinal?: A Little Help from another person bathing (including washing, rinsing, drying)?: A Little Help from another person to put on and taking off regular upper  body clothing?: A Little Help from another person to put on and taking off regular lower body clothing?: A Little 6 Click Score: 19    End of Session Equipment Utilized During Treatment: Gait belt;Rolling walker  OT Visit Diagnosis: Other abnormalities of gait and mobility (R26.89);Muscle weakness (generalized) (M62.81)   Activity Tolerance Patient tolerated treatment well   Patient Left in bed;with call bell/phone within reach;with family/visitor present   Nurse Communication Mobility status        Time: 1610-9604 OT Time Calculation (min): 36 min  Charges: OT General Charges $OT Visit: 1 Visit OT Treatments $Self Care/Home Management : 23-37 mins   Fredirick Maudlin, OTR/L [place]

## 2020-11-18 NOTE — Progress Notes (Signed)
Date of Admission:  11/11/2020     ID: [name] is a 70 y.o. male  Principal Problem:   Sepsis ([place]) Active Problems:   PAD (peripheral artery disease) ([place])   Diabetes ([place])   Mixed hyperlipidemia   Essential hypertension   GERD (gastroesophageal reflux disease)   Uncontrolled type 2 diabetes mellitus with hyperglycemia ([facility])   Cardiomyopathy, nonischemic ([facility])   Primary generalized (osteo)arthritis   Moderate asthma without complication   Diabetic foot ulcer ([place])    Subjective: Patient doing fine Participating in PT  Medications:  . aspirin EC  81 mg Oral Daily  . clopidogrel  75 mg Oral Daily  . collagenase   Topical Daily  . docusate sodium  100 mg Oral BID  . enoxaparin (LOVENOX) injection  40 mg Subcutaneous Q24H  . fluticasone  2 spray Each Nare Daily  . gabapentin  300 mg Oral TID  . insulin aspart  0-15 Units Subcutaneous TID WC  . insulin aspart  0-5 Units Subcutaneous QHS  . metroNIDAZOLE  500 mg Oral 3 times per day  . mometasone-formoterol  2 puff Inhalation BID  . oxybutynin  5 mg Oral BID  . pantoprazole  40 mg Oral Daily   Or  . pantoprazole (PROTONIX) IV  40 mg Intravenous Daily  . polyethylene glycol  17 g Oral Daily  . pravastatin  10 mg Oral q1800    Objective: Vital signs in last 24 hours: Temp:  [97.7 F (36.5 C)-98.4 F (36.9 C)] 98.4 F (36.9 C) (02/22 1915) Pulse Rate:  [87-105] 105 (02/22 1915) Resp:  [18] 18 (02/22 1915) BP: (100-120)/(59-78) 112/70 (02/22 1915) SpO2:  [93 %-100 %] 98 % (02/22 1915)  PHYSICAL EXAM:  General: Alert, cooperative, no distress, pale Head: Normocephalic, without obvious abnormality, atraumatic. Eyes: Conjunctivae clear, anicteric sclerae. Pupils are equal ENT Nares normal. No drainage or sinus tenderness. Lips, mucosa, and tongue normal. No Thrush Neck: Supple, symmetrical, no adenopathy, thyroid: non tender no carotid bruit and no JVD. Back: No CVA tenderness. Lungs: Clear to auscultation  bilaterally. No Wheezing or Rhonchi. No rales. Heart: Regular rate and rhythm, no murmur, rub or gallop. Abdomen: Soft, non-tender,not distended. Bowel sounds normal. No masses Extremities:       Skin: No rashes or lesions. Or bruising Lymph: Cervical, supraclavicular normal. Neurologic: Grossly non-focal  Lab Results Recent Labs    11/17/20 0458 11/18/20 0003  WBC 4.8 5.4  HGB 10.3* 10.1*  HCT 31.6* 31.0*  NA 131* 131*  K 4.1 4.1  CL 98 99  CO2 25 24  BUN 9 10  CREATININE 0.85 0.86   Liver Panel Recent Labs    11/18/20 0003  PROT 6.4*  ALBUMIN 2.7*  AST 21  ALT 18  ALKPHOS 42  BILITOT 1.0   Sedimentation Rate Recent Labs    11/18/20 0003  ESRSEDRATE 30*   C-Reactive Protein Recent Labs    11/18/20 0003  CRP 2.1*     Microbiology: 11/11/2020 4 out of 4 blood culture positive for Enterococcus faecalis and staph aureus 11/12/2020 1 out of 4 bottle Enterococcus faecalis 11/12/2020 wound culture has Enterococcus and staph aureus and Prevotella brevia.  11/14/2020 blood culture no growth   11/15/2020 blood culture 3 out of 4 bottles  Enterococcus faecalis  11/16/2020 4 out of 4 bottles Enterococcus faecalis. 11/18/2020 blood culture sent Studies/Results: MR CERVICAL SPINE W WO CONTRAST  Result Date: 11/18/2020 CLINICAL DATA:  Cervical radiculopathy, infection suspected. Epidural abscess. Lumbar radiculopathy, cancer  or infection suspected. EXAM: MRI CERVICAL, THORACIC AND LUMBAR SPINE WITHOUT AND WITH CONTRAST TECHNIQUE: Multiplanar and multiecho pulse sequences of the cervical spine, to include the craniocervical junction and cervicothoracic junction, and thoracic and lumbar spine, were obtained without and with intravenous contrast. CONTRAST:  20mL GADAVIST GADOBUTROL 1 MMOL/ML IV SOLN COMPARISON:  None. FINDINGS: MRI CERVICAL SPINE FINDINGS Alignment: Straightening of the cervical curvature. Vertebrae: No fracture, evidence of discitis, or bone lesion. Cord:  Mass effect on the cord with flattening at the C4-5 level. No cord signal abnormality. Posterior Fossa, vertebral arteries, paraspinal tissues: Negative. Disc levels: C2-3: Small posterior disc protrusion resulting mild spinal canal stenosis. Facet degenerative changes resulting in moderate right neural foraminal narrowing. C3-4: Posterior disc protrusion without significant spinal canal stenosis. Uncovertebral and facet degenerative changes resulting in severe left neural foraminal narrowing. C4-5: Posterior disc protrusion resulting in moderate to severe spinal canal stenosis with mass effect on the cord without cord signal abnormality. Uncovertebral and facet degenerative changes resulting in moderate right and severe left neural foraminal narrowing. C5-6: Posterior disc protrusion resulting in mild spinal canal stenosis. Uncovertebral and facet degenerative changes resulting in severe right and mild left neural foraminal narrowing. C6-7: Posterior disc protrusion resulting in moderate spinal canal stenosis. Uncovertebral and facet degenerative changes resulting in severe bilateral neural foraminal narrowing. C7-T1: No spinal canal or neural foraminal stenosis. MRI THORACIC SPINE FINDINGS Alignment:  Physiologic. Vertebrae: No fracture, evidence of discitis, or bone lesion. Cord:  Normal signal and morphology. Paraspinal and other soft tissues: Small bilateral pleural effusion. Disc levels: At T9-10, a left posterolateral disc protrusion, facet degenerative changes with ligamentum flavum redundancy causes indentations of the thecal sac without significant spinal canal or neural foraminal stenosis. No significant disc herniation, spinal canal or neural foraminal stenosis in the other thoracic levels. MRI LUMBAR SPINE FINDINGS Segmentation: A transitional lumbosacral vertebra is assumed to represent a partially sacralized L5 level with rudimentary disc at L5-S1. Careful correlation with this numbering strategy  prior to any procedural intervention would be recommended. Alignment:  Minimal retrolisthesis of L1 over L2 and L3 over L4. Vertebrae: No fracture, evidence of discitis, or bone lesion. Loss of disc height with prominent endplate degenerative changes at L1-2 and L4-5. Conus medullaris and cauda equina: Conus extends to the L1 level. Conus and cauda equina appear normal. Paraspinal and other soft tissues: Negative. Disc levels: T12-L1: No spinal canal or neural foraminal stenosis. L1-2: Loss of disc height, disc bulge, moderate facet degenerative change ligamentum flavum redundancy resulting in mild to moderate spinal canal stenosis with narrowing of the bilateral subarticular zones and mild bilateral neural foraminal narrowing. L2-3: Shallow disc bulge and moderate facet degenerative changes resulting in mild spinal canal stenosis and mild right neural foraminal narrowing. L3-4: Shallow disc bulge with superimposed small central disc protrusion, moderate facet degenerative change ligamentum flavum redundancy resulting mild spinal canal stenosis with narrowing of the bilateral subarticular zones and mild bilateral neural foraminal narrowing. L4-5: Loss of disc height, disc bulge with associated osteophytic component and moderate facet degenerative changes resulting mild narrowing of the bilateral subarticular zones and moderate bilateral neural foraminal narrowing. L5-S1: No spinal canal or neural foraminal stenosis. IMPRESSION: 1. No evidence of discitis-osteomyelitis. 2. Multilevel degenerative changes of the cervical spine with moderate to severe spinal canal stenosis at C4-5 with mass effect on the cord without cord signal abnormality. Moderate spinal canal stenosis at C6-7. 3. No significant spinal canal or neural foraminal stenosis at the thoracic levels. 4. Transitional lumbosacral  vertebra is assumed to represent a partially sacralized L5 level with rudimentary disc at L5-S1. Careful correlation with this  numbering strategy prior to any procedural intervention would be recommended. 5. Multilevel degenerative changes of the lumbar spine with mild-to-moderate spinal canal stenosis at L1-2 and mild spinal canal stenosis at L2-3 and L3-4. There is narrowing of the bilateral subarticular zones at L1-2 and L4-5 with mild bilateral neural foraminal narrowing. 6. Small bilateral pleural effusion. Electronically Signed   By: Pedro Earls M.D.   On: 11/18/2020 16:11   MR THORACIC SPINE W WO CONTRAST  Result Date: 11/18/2020 CLINICAL DATA:  Cervical radiculopathy, infection suspected. Epidural abscess. Lumbar radiculopathy, cancer or infection suspected. EXAM: MRI CERVICAL, THORACIC AND LUMBAR SPINE WITHOUT AND WITH CONTRAST TECHNIQUE: Multiplanar and multiecho pulse sequences of the cervical spine, to include the craniocervical junction and cervicothoracic junction, and thoracic and lumbar spine, were obtained without and with intravenous contrast. CONTRAST:  65mL GADAVIST GADOBUTROL 1 MMOL/ML IV SOLN COMPARISON:  None. FINDINGS: MRI CERVICAL SPINE FINDINGS Alignment: Straightening of the cervical curvature. Vertebrae: No fracture, evidence of discitis, or bone lesion. Cord: Mass effect on the cord with flattening at the C4-5 level. No cord signal abnormality. Posterior Fossa, vertebral arteries, paraspinal tissues: Negative. Disc levels: C2-3: Small posterior disc protrusion resulting mild spinal canal stenosis. Facet degenerative changes resulting in moderate right neural foraminal narrowing. C3-4: Posterior disc protrusion without significant spinal canal stenosis. Uncovertebral and facet degenerative changes resulting in severe left neural foraminal narrowing. C4-5: Posterior disc protrusion resulting in moderate to severe spinal canal stenosis with mass effect on the cord without cord signal abnormality. Uncovertebral and facet degenerative changes resulting in moderate right and severe left neural  foraminal narrowing. C5-6: Posterior disc protrusion resulting in mild spinal canal stenosis. Uncovertebral and facet degenerative changes resulting in severe right and mild left neural foraminal narrowing. C6-7: Posterior disc protrusion resulting in moderate spinal canal stenosis. Uncovertebral and facet degenerative changes resulting in severe bilateral neural foraminal narrowing. C7-T1: No spinal canal or neural foraminal stenosis. MRI THORACIC SPINE FINDINGS Alignment:  Physiologic. Vertebrae: No fracture, evidence of discitis, or bone lesion. Cord:  Normal signal and morphology. Paraspinal and other soft tissues: Small bilateral pleural effusion. Disc levels: At T9-10, a left posterolateral disc protrusion, facet degenerative changes with ligamentum flavum redundancy causes indentations of the thecal sac without significant spinal canal or neural foraminal stenosis. No significant disc herniation, spinal canal or neural foraminal stenosis in the other thoracic levels. MRI LUMBAR SPINE FINDINGS Segmentation: A transitional lumbosacral vertebra is assumed to represent a partially sacralized L5 level with rudimentary disc at L5-S1. Careful correlation with this numbering strategy prior to any procedural intervention would be recommended. Alignment:  Minimal retrolisthesis of L1 over L2 and L3 over L4. Vertebrae: No fracture, evidence of discitis, or bone lesion. Loss of disc height with prominent endplate degenerative changes at L1-2 and L4-5. Conus medullaris and cauda equina: Conus extends to the L1 level. Conus and cauda equina appear normal. Paraspinal and other soft tissues: Negative. Disc levels: T12-L1: No spinal canal or neural foraminal stenosis. L1-2: Loss of disc height, disc bulge, moderate facet degenerative change ligamentum flavum redundancy resulting in mild to moderate spinal canal stenosis with narrowing of the bilateral subarticular zones and mild bilateral neural foraminal narrowing. L2-3:  Shallow disc bulge and moderate facet degenerative changes resulting in mild spinal canal stenosis and mild right neural foraminal narrowing. L3-4: Shallow disc bulge with superimposed small central disc protrusion, moderate  facet degenerative change ligamentum flavum redundancy resulting mild spinal canal stenosis with narrowing of the bilateral subarticular zones and mild bilateral neural foraminal narrowing. L4-5: Loss of disc height, disc bulge with associated osteophytic component and moderate facet degenerative changes resulting mild narrowing of the bilateral subarticular zones and moderate bilateral neural foraminal narrowing. L5-S1: No spinal canal or neural foraminal stenosis. IMPRESSION: 1. No evidence of discitis-osteomyelitis. 2. Multilevel degenerative changes of the cervical spine with moderate to severe spinal canal stenosis at C4-5 with mass effect on the cord without cord signal abnormality. Moderate spinal canal stenosis at C6-7. 3. No significant spinal canal or neural foraminal stenosis at the thoracic levels. 4. Transitional lumbosacral vertebra is assumed to represent a partially sacralized L5 level with rudimentary disc at L5-S1. Careful correlation with this numbering strategy prior to any procedural intervention would be recommended. 5. Multilevel degenerative changes of the lumbar spine with mild-to-moderate spinal canal stenosis at L1-2 and mild spinal canal stenosis at L2-3 and L3-4. There is narrowing of the bilateral subarticular zones at L1-2 and L4-5 with mild bilateral neural foraminal narrowing. 6. Small bilateral pleural effusion. Electronically Signed   By: Pedro Earls M.D.   On: 11/18/2020 16:11   MR Lumbar Spine W Wo Contrast  Result Date: 11/18/2020 CLINICAL DATA:  Cervical radiculopathy, infection suspected. Epidural abscess. Lumbar radiculopathy, cancer or infection suspected. EXAM: MRI CERVICAL, THORACIC AND LUMBAR SPINE WITHOUT AND WITH CONTRAST  TECHNIQUE: Multiplanar and multiecho pulse sequences of the cervical spine, to include the craniocervical junction and cervicothoracic junction, and thoracic and lumbar spine, were obtained without and with intravenous contrast. CONTRAST:  68mL GADAVIST GADOBUTROL 1 MMOL/ML IV SOLN COMPARISON:  None. FINDINGS: MRI CERVICAL SPINE FINDINGS Alignment: Straightening of the cervical curvature. Vertebrae: No fracture, evidence of discitis, or bone lesion. Cord: Mass effect on the cord with flattening at the C4-5 level. No cord signal abnormality. Posterior Fossa, vertebral arteries, paraspinal tissues: Negative. Disc levels: C2-3: Small posterior disc protrusion resulting mild spinal canal stenosis. Facet degenerative changes resulting in moderate right neural foraminal narrowing. C3-4: Posterior disc protrusion without significant spinal canal stenosis. Uncovertebral and facet degenerative changes resulting in severe left neural foraminal narrowing. C4-5: Posterior disc protrusion resulting in moderate to severe spinal canal stenosis with mass effect on the cord without cord signal abnormality. Uncovertebral and facet degenerative changes resulting in moderate right and severe left neural foraminal narrowing. C5-6: Posterior disc protrusion resulting in mild spinal canal stenosis. Uncovertebral and facet degenerative changes resulting in severe right and mild left neural foraminal narrowing. C6-7: Posterior disc protrusion resulting in moderate spinal canal stenosis. Uncovertebral and facet degenerative changes resulting in severe bilateral neural foraminal narrowing. C7-T1: No spinal canal or neural foraminal stenosis. MRI THORACIC SPINE FINDINGS Alignment:  Physiologic. Vertebrae: No fracture, evidence of discitis, or bone lesion. Cord:  Normal signal and morphology. Paraspinal and other soft tissues: Small bilateral pleural effusion. Disc levels: At T9-10, a left posterolateral disc protrusion, facet degenerative  changes with ligamentum flavum redundancy causes indentations of the thecal sac without significant spinal canal or neural foraminal stenosis. No significant disc herniation, spinal canal or neural foraminal stenosis in the other thoracic levels. MRI LUMBAR SPINE FINDINGS Segmentation: A transitional lumbosacral vertebra is assumed to represent a partially sacralized L5 level with rudimentary disc at L5-S1. Careful correlation with this numbering strategy prior to any procedural intervention would be recommended. Alignment:  Minimal retrolisthesis of L1 over L2 and L3 over L4. Vertebrae: No fracture, evidence of discitis,  or bone lesion. Loss of disc height with prominent endplate degenerative changes at L1-2 and L4-5. Conus medullaris and cauda equina: Conus extends to the L1 level. Conus and cauda equina appear normal. Paraspinal and other soft tissues: Negative. Disc levels: T12-L1: No spinal canal or neural foraminal stenosis. L1-2: Loss of disc height, disc bulge, moderate facet degenerative change ligamentum flavum redundancy resulting in mild to moderate spinal canal stenosis with narrowing of the bilateral subarticular zones and mild bilateral neural foraminal narrowing. L2-3: Shallow disc bulge and moderate facet degenerative changes resulting in mild spinal canal stenosis and mild right neural foraminal narrowing. L3-4: Shallow disc bulge with superimposed small central disc protrusion, moderate facet degenerative change ligamentum flavum redundancy resulting mild spinal canal stenosis with narrowing of the bilateral subarticular zones and mild bilateral neural foraminal narrowing. L4-5: Loss of disc height, disc bulge with associated osteophytic component and moderate facet degenerative changes resulting mild narrowing of the bilateral subarticular zones and moderate bilateral neural foraminal narrowing. L5-S1: No spinal canal or neural foraminal stenosis. IMPRESSION: 1. No evidence of  discitis-osteomyelitis. 2. Multilevel degenerative changes of the cervical spine with moderate to severe spinal canal stenosis at C4-5 with mass effect on the cord without cord signal abnormality. Moderate spinal canal stenosis at C6-7. 3. No significant spinal canal or neural foraminal stenosis at the thoracic levels. 4. Transitional lumbosacral vertebra is assumed to represent a partially sacralized L5 level with rudimentary disc at L5-S1. Careful correlation with this numbering strategy prior to any procedural intervention would be recommended. 5. Multilevel degenerative changes of the lumbar spine with mild-to-moderate spinal canal stenosis at L1-2 and mild spinal canal stenosis at L2-3 and L3-4. There is narrowing of the bilateral subarticular zones at L1-2 and L4-5 with mild bilateral neural foraminal narrowing. 6. Small bilateral pleural effusion. Electronically Signed   By: Pedro Earls M.D.   On: 11/18/2020 16:11   CT ABDOMEN PELVIS W CONTRAST  Result Date: 11/18/2020 CLINICAL DATA:  Abdominal abscess. EXAM: CT ABDOMEN AND PELVIS WITH CONTRAST TECHNIQUE: Multidetector CT imaging of the abdomen and pelvis was performed using the standard protocol following bolus administration of intravenous contrast. CONTRAST:  149mL OMNIPAQUE IOHEXOL 300 MG/ML  SOLN COMPARISON:  December 30, 2006. FINDINGS: Lower chest: Small bilateral pleural effusions are noted with adjacent subsegmental atelectasis. Hepatobiliary: No focal liver abnormality is seen. Status post cholecystectomy. No biliary dilatation. Pancreas: Unremarkable. No pancreatic ductal dilatation or surrounding inflammatory changes. Spleen: Normal in size without focal abnormality. Adrenals/Urinary Tract: Adrenal glands are unremarkable. Kidneys are normal, without renal calculi, focal lesion, or hydronephrosis. Mild urinary bladder distention is noted. Stomach/Bowel: Moderate gastric distention is noted. Stool is noted throughout the colon. No  large or small bowel dilatation is noted. No definite inflammation is noted. The appendix is unremarkable. Vascular/Lymphatic: Aortic atherosclerosis. Bilateral inguinal adenopathy is noted which may be inflammatory or reactive in etiology. Reproductive: Prostate is unremarkable. Other: Scrotal hydrocele is noted. Musculoskeletal: Degenerative disc disease is noted at L2-3 and L5-S1. No acute abnormality is noted. IMPRESSION: 1. Moderate gastric distention is noted. Stool is noted throughout the colon. 2. Small bilateral pleural effusions are noted with adjacent subsegmental atelectasis. 3. Aortic atherosclerosis. 4. Bilateral inguinal adenopathy is noted which may be inflammatory or reactive in etiology. 5. Mild urinary bladder distention is noted. 6. Scrotal hydrocele is noted. Aortic Atherosclerosis (ICD10-I70.0). Electronically Signed   By: Marijo Conception M.D.   On: 11/18/2020 15:28   PERIPHERAL VASCULAR CATHETERIZATION  Result Date: 11/17/2020 See  op note    Assessment/Plan: Enterococcus bacteremia Staff aureus bacteremia Left heel wound has Enterococcus and staph aureus.  So likely source for the bacteremia is from the heel wound.  Even though the MRI and x-ray of the left foot does not show osteomyelitis it is very well during why he would have persistent bacteremia.  Persistent Enterococcus bacteremia after 1 - culture on 11/14/2020. This could be uncontrolled infection or switching antibiotics when Vanco was switched to daptomycin on 10/13/2020. So restarted  vancomycin yesterday.  Repeat blood culture has been sent today.  If Vanco trough is therapeutic we will stop daptomycin. Also patient has been pan imaged and no infectious findings on the MRI of the cervical thoracic lumbar spine as well as CT abdomen. TEE negative for endocarditis Because of  bacteremia patient is going to need treatment for at least 6 weeks with antibiotics.  Podiatry does not think that the wound needs  debridement currently  Diabetes mellitus on insulin  Peripheral artery disease underwent percutaneous transluminal angioplasty of the left peroneal artery.  Discussed the management with the patient and the care team. We will discuss with the podiatrist.

## 2020-11-18 NOTE — Progress Notes (Signed)
[facility] Daily Progress Note  Subjective: 11/18/20: 1. Ultrasound guidance for vascular access right femoral artery 2. Catheter placement into left anterior tibial and left tibioperoneal trunk from right femoral approach 3. Aortogram and selective left lower extremity angiogram including selective images of the anterior tibial artery and the tibioperoneal trunk/peroneal artery. 4. Percutaneous transluminal angioplasty of left peroneal artery with 2.5 and 3 mm diameter angioplasty balloons 5. StarClose closure device right femoral artery  Patient without complaint this AM.  No issues overnight.  Objective: Vitals:   11/17/20 2332 11/18/20 0455 11/18/20 0854 11/18/20 1216  BP: 114/71 100/66 103/62 109/78  Pulse: 97 97 89 87  Resp: 18 18 18    Temp: 98.3 F (36.8 C) 97.8 F (36.6 C) 98.1 F (36.7 C) 97.7 F (36.5 C)  TempSrc: Oral Oral    SpO2: 97% 96% 93% 100%  Weight:      Height:        Intake/Output Summary (Last 24 hours) at 11/18/2020 1256 Last data filed at 11/18/2020 1053 Gross per 24 hour  Intake 838.05 ml  Output 1850 ml  Net -1011.95 ml   Physical Exam: A&Ox3, NAD CV: RRR Pulmonary: CTA Bilaterally Abdomen: Soft, Nontender, Nondistended Right groin:  Access site: PAD intact, clean and dry Vascular:  Left lower extremity: Thigh soft.  Extremities warm distally to toes.  Podiatry dressing intact clean and dry.   Laboratory: CBC    Component Value Date/Time   WBC 5.4 11/18/2020 0003   HGB 10.1 (L) 11/18/2020 0003   HGB 15.2 04/17/2014 1026   HCT 31.0 (L) 11/18/2020 0003   HCT 44.6 04/17/2014 1026   PLT 181 11/18/2020 0003   PLT 127 (L) 04/17/2014 1026   BMET    Component Value Date/Time   NA 131 (L) 11/18/2020 0003   NA 136 04/17/2014 1026   K 4.1 11/18/2020 0003   K 5.1 04/17/2014 1026   CL 99 11/18/2020 0003   CL 103 04/17/2014 1026   CO2 24 11/18/2020 0003    CO2 28 04/17/2014 1026   GLUCOSE 126 (H) 11/18/2020 0003   GLUCOSE 124 (H) 04/17/2014 1026   BUN 10 11/18/2020 0003   BUN 28 (H) 04/17/2014 1026   CREATININE 0.86 11/18/2020 0003   CREATININE 1.30 04/17/2014 1026   CALCIUM 8.2 (L) 11/18/2020 0003   CALCIUM 8.6 04/17/2014 1026   GFRNONAA >60 11/18/2020 0003   GFRNONAA 59 (L) 04/17/2014 1026   GFRAA >60 06/20/2020 1001   GFRAA >60 04/17/2014 1026   Assessment/Planning: The patient is a 70 year old male with multiple medical issues including known atherosclerotic disease to the bilateral lower extremity who presents with ulceration and infection to the left lower extremity now status post angiogram with intervention  1) patient is status post successful revascularization of the left lower extremity 2) on aspirin and statin for medical management 3) we will continue to follow in the outpatient setting.  No further recommendations from vascular at this time.  Discussed with Dr. Ellis Parents Tyja Gortney PA-C 11/18/2020 12:56 PM

## 2020-11-18 NOTE — Progress Notes (Signed)
PROGRESS NOTE    [name]  HDQ:[id number] DOB: 1951-09-03 DOA: 11/11/2020 PCP: [name], MD   Brief Narrative:  70 year old with history of essential hypertension, nonischemic cardiomyopathy, DM2, peripheral neuropathy, PAD, history of tobacco use quit 1998 presents with shortness of breath, poor oral intake nausea, diarrhea with left lower extremity swelling with worsening diabetic foot infection.  Patient has been fully vaccinated and boosted against COVID-19. Lower extremity Dopplers negative for DVT, CTA chest showed pleural effusion/atelectasis pleural effusion and atelectasis but no PE.  Blood cultures are positive for gram-positive cocci.  Surveillance cultures ordered.  Cardiology consulted for TEE which was negative.  Due to persistent bacteremia MRI spine and CT abdomen pelvis was ordered.  Tentative plans for 6 weeks of IV antibiotics per infectious disease.  PICC line will be placed after clearance of bacteremia.  Assessment & Plan:   Principal Problem:   Sepsis ([place]) Active Problems:   PAD (peripheral artery disease) ([place])   Diabetes ([place])   Mixed hyperlipidemia   Essential hypertension   GERD (gastroesophageal reflux disease)   Uncontrolled type 2 diabetes mellitus with hyperglycemia ([facility])   Cardiomyopathy, nonischemic ([place])   Primary generalized (osteo)arthritis   Moderate asthma without complication   Diabetic foot ulcer ([place])  Sepsis secondary to diabetic foot infection of his left lower extremity with ulceration and cellulitis nonpurulent MSSA/Enterococcus bacteremia, persistent bacteremia Left lower extremity leg pain -Sepsis physiology appears to be improving. -MRI foot-showing cellulitis and myositis but no evidence of osteomyelitis -Antibiotics-IV daptomycin and vancomycin per ID -Repeat cultures remain positive.  PICC line will be placed once is cleared -Podiatry-dressing changes, culture sent.  No surgical indication at this time.  Podiatry team  aware of persistent bacteremia, they did not seem to think that is the source but can repeat MRI foot if necessary. -Vascular-plans for angiogram on 2/18 with possible interventions -Lower extremity Dopplers-negative for DVT -Echocardiogram-EF 60 to 65% -TEE 2/18-negative for endocarditis -MRI spine with contrast, CT abdomen pelvis with and without contrast has been ordered to rule out any spinal or intra-abdominal pathology which may explain persistent bacteremia.  Peripheral arterial disease - Aspirin Plavix and statin -Status post left lower extremity angio 2/21-status post balloon angioplasty.  Chronic pain   -Pain control with bowel regimen  Shortness of breath with bilateral pleural effusion/atelectasis without hypoxia, improved -Out of bed to chair.  Incentive spirometer.  As needed bronchodilators liters -Echocardiogram -EF 60 to 65%, no evidence of endocarditis  Diabetes mellitus type 2, acceptable range -Peripheral neuropathy secondary to DM2 -Insulin sliding scale and Accu-Chek -Gabapentin 3 times daily  Hyperlipidemia Cont Statin   DVT prophylaxis: Lovenox Code Status: DNR Family Communication:    Status is: Inpatient  Remains inpatient appropriate because:Inpatient level of care appropriate due to severity of illness   Dispo: The patient is from: Home              Anticipated d/c is to: SNF              Anticipated d/c date is: 2-3 days              Patient currently is not medically stable to d/c.  Persistent bacteremia, investigating source.  Once bacteremia has cleared, will need PICC line placement and home antibiotic arrangements.   Difficult to place patient No    Body mass index is 25.94 kg/m.     Subjective: Remains afebrile, does not have any complaints.  Review of Systems Otherwise negative except as per  HPI, including: General: Denies fever, chills, night sweats or unintended weight loss. Resp: Denies cough, wheezing, shortness of  breath. Cardiac: Denies chest pain, palpitations, orthopnea, paroxysmal nocturnal dyspnea. GI: Denies abdominal pain, nausea, vomiting, diarrhea or constipation GU: Denies dysuria, frequency, hesitancy or incontinence MS: Denies muscle aches, joint pain or swelling Neuro: Denies headache, neurologic deficits (focal weakness, numbness, tingling), abnormal gait Psych: Denies anxiety, depression, SI/HI/AVH Skin: Denies new rashes or lesions ID: Denies sick contacts, exotic exposures, travel  Examination: Constitutional: Not in acute distress Respiratory: Clear to auscultation bilaterally Cardiovascular: Normal sinus rhythm, no rubs Abdomen: Nontender nondistended good bowel sounds Musculoskeletal: No edema noted Skin: Left lower extremity dressing in place Neurologic: CN 2-12 grossly intact.  And nonfocal Psychiatric: Normal judgment and insight. Alert and oriented x 3. Normal mood.  Objective: Vitals:   11/17/20 2332 11/18/20 0455 11/18/20 0854 11/18/20 1216  BP: 114/71 100/66 103/62 109/78  Pulse: 97 97 89 87  Resp: 18 18 18    Temp: 98.3 F (36.8 C) 97.8 F (36.6 C) 98.1 F (36.7 C) 97.7 F (36.5 C)  TempSrc: Oral Oral    SpO2: 97% 96% 93% 100%  Weight:      Height:        Intake/Output Summary (Last 24 hours) at 11/18/2020 1242 Last data filed at 11/18/2020 1053 Gross per 24 hour  Intake 838.05 ml  Output 1850 ml  Net -1011.95 ml   Filed Weights   11/11/20 1452  Weight: 82 kg     Data Reviewed:   CBC: Recent Labs  Lab 11/14/20 0014 11/15/20 0417 11/16/20 0523 11/17/20 0458 11/18/20 0003  WBC 4.6 4.9 4.7 4.8 5.4  HGB 9.7* 10.2* 10.4* 10.3* 10.1*  HCT 29.6* 32.5* 31.6* 31.6* 31.0*  MCV 87.6 88.6 86.8 86.8 86.4  PLT 166 176 191 179 025   Basic Metabolic Panel: Recent Labs  Lab 11/14/20 0014 11/15/20 0417 11/16/20 0523 11/17/20 0458 11/18/20 0003  NA 133* 136 131* 131* 131*  K 4.0 4.3 4.0 4.1 4.1  CL 100 101 98 98 99  CO2 25 29 24 25 24   GLUCOSE  165* 115* 198* 159* 126*  BUN 12 7* 8 9 10   CREATININE 0.94 0.85 0.83 0.85 0.86  CALCIUM 7.9* 8.0* 8.3* 8.6* 8.2*  MG 2.1 2.1 1.9 2.0 2.0   GFR: Estimated Creatinine Clearance: 86.1 mL/min (by C-G formula based on SCr of 0.86 mg/dL). Liver Function Tests: Recent Labs  Lab 11/11/20 1513 11/18/20 0003  AST 23 21  ALT 22 18  ALKPHOS 56 42  BILITOT 1.2 1.0  PROT 6.0* 6.4*  ALBUMIN 2.7* 2.7*   No results for input(s): LIPASE, AMYLASE in the last 168 hours. No results for input(s): AMMONIA in the last 168 hours. Coagulation Profile: Recent Labs  Lab 11/12/20 0615  INR 1.2   Cardiac Enzymes: Recent Labs  Lab 11/14/20 0014  CKTOTAL 26*   BNP (last 3 results) No results for input(s): PROBNP in the last 8760 hours. HbA1C: No results for input(s): HGBA1C in the last 72 hours. CBG: Recent Labs  Lab 11/17/20 1320 11/17/20 1651 11/17/20 2107 11/18/20 0733 11/18/20 1212  GLUCAP 124* 117* 109* 164* 144*   Lipid Profile: No results for input(s): CHOL, HDL, LDLCALC, TRIG, CHOLHDL, LDLDIRECT in the last 72 hours. Thyroid Function Tests: No results for input(s): TSH, T4TOTAL, FREET4, T3FREE, THYROIDAB in the last 72 hours. Anemia Panel: No results for input(s): VITAMINB12, FOLATE, FERRITIN, TIBC, IRON, RETICCTPCT in the last 72 hours. Sepsis Labs: Recent  Labs  Lab 11/11/20 1513 11/11/20 1638 11/11/20 1845  PROCALCITON  --   --  0.34  LATICACIDVEN 1.1 2.1* 1.2    Recent Results (from the past 240 hour(s))  Blood culture (routine x 2)     Status: Abnormal   Collection Time: 11/11/20  3:13 PM   Specimen: BLOOD  Result Value Ref Range Status   Specimen Description   Final    BLOOD RIGHT ANTECUBITAL Performed at [facility], [address] [place], [place] [address]    Special Requests   Final    BOTTLES DRAWN AEROBIC AND ANAEROBIC Blood Culture adequate volume Performed at [facility], [address], [place], [place] [address]    Culture   Setup Time   Final    GRAM POSITIVE COCCI IN BOTH AEROBIC AND ANAEROBIC BOTTLES Organism ID to follow CRITICAL RESULT CALLED TO, READ BACK BY AND VERIFIED WITHVioleta Gelinas PHARMD [id number] 10/28/20 HNM Performed at [facility], [address], [place], [place] [address]    Culture ENTEROCOCCUS FAECALIS STAPHYLOCOCCUS AUREUS  (A)  Final   Report Status 11/15/2020 FINAL  Final   Organism ID, Bacteria ENTEROCOCCUS FAECALIS  Final   Organism ID, Bacteria STAPHYLOCOCCUS AUREUS  Final      Susceptibility   Enterococcus faecalis - MIC*    AMPICILLIN <=2 SENSITIVE Sensitive     VANCOMYCIN 1 SENSITIVE Sensitive     GENTAMICIN SYNERGY SENSITIVE Sensitive     * ENTEROCOCCUS FAECALIS   Staphylococcus aureus - MIC*    CIPROFLOXACIN <=0.5 SENSITIVE Sensitive     ERYTHROMYCIN >=8 RESISTANT Resistant     GENTAMICIN <=0.5 SENSITIVE Sensitive     OXACILLIN <=0.25 SENSITIVE Sensitive     TETRACYCLINE <=1 SENSITIVE Sensitive     VANCOMYCIN <=0.5 SENSITIVE Sensitive     TRIMETH/SULFA <=10 SENSITIVE Sensitive     CLINDAMYCIN RESISTANT Resistant     RIFAMPIN <=0.5 SENSITIVE Sensitive     Inducible Clindamycin POSITIVE Resistant     * STAPHYLOCOCCUS AUREUS  Blood Culture ID Panel (Reflexed)     Status: Abnormal   Collection Time: 11/11/20  3:13 PM  Result Value Ref Range Status   Enterococcus faecalis DETECTED (A) NOT DETECTED Final    Comment: CRITICAL RESULT CALLED TO, READ BACK BY AND VERIFIED WITH: Violeta Gelinas PHAMRD [id number] 11/12/20 HNM    Enterococcus Faecium NOT DETECTED NOT DETECTED Final   Listeria monocytogenes NOT DETECTED NOT DETECTED Final   Staphylococcus species NOT DETECTED NOT DETECTED Final   Staphylococcus aureus (BCID) NOT DETECTED NOT DETECTED Final   Staphylococcus epidermidis NOT DETECTED NOT DETECTED Final   Staphylococcus lugdunensis NOT DETECTED NOT DETECTED Final   Streptococcus species NOT DETECTED NOT DETECTED Final   Streptococcus agalactiae NOT DETECTED NOT  DETECTED Final   Streptococcus pneumoniae NOT DETECTED NOT DETECTED Final   Streptococcus pyogenes NOT DETECTED NOT DETECTED Final   A.calcoaceticus-baumannii NOT DETECTED NOT DETECTED Final   Bacteroides fragilis NOT DETECTED NOT DETECTED Final   Enterobacterales NOT DETECTED NOT DETECTED Final   Enterobacter cloacae complex NOT DETECTED NOT DETECTED Final   Escherichia coli NOT DETECTED NOT DETECTED Final   Klebsiella aerogenes NOT DETECTED NOT DETECTED Final   Klebsiella oxytoca NOT DETECTED NOT DETECTED Final   Klebsiella pneumoniae NOT DETECTED NOT DETECTED Final   Proteus species NOT DETECTED NOT DETECTED Final   Salmonella species NOT DETECTED NOT DETECTED Final   Serratia marcescens NOT DETECTED NOT DETECTED Final   Haemophilus influenzae NOT DETECTED NOT DETECTED  Final   Neisseria meningitidis NOT DETECTED NOT DETECTED Final   Pseudomonas aeruginosa NOT DETECTED NOT DETECTED Final   Stenotrophomonas maltophilia NOT DETECTED NOT DETECTED Final   Candida albicans NOT DETECTED NOT DETECTED Final   Candida auris NOT DETECTED NOT DETECTED Final   Candida glabrata NOT DETECTED NOT DETECTED Final   Candida krusei NOT DETECTED NOT DETECTED Final   Candida parapsilosis NOT DETECTED NOT DETECTED Final   Candida tropicalis NOT DETECTED NOT DETECTED Final   Cryptococcus neoformans/gattii NOT DETECTED NOT DETECTED Final   Vancomycin resistance NOT DETECTED NOT DETECTED Final    Comment: Performed at [facility], [address] [place], [place] [address]  Blood culture (routine x 2)     Status: Abnormal   Collection Time: 11/11/20  4:38 PM   Specimen: BLOOD  Result Value Ref Range Status   Specimen Description   Final    BLOOD LEFT ANTECUBITAL Performed at [facility], [address] [place], [place] [address]    Special Requests   Final    BOTTLES DRAWN AEROBIC AND ANAEROBIC Blood Culture results may not be optimal due to an inadequate volume of blood received  in culture bottles Performed at [facility], [address] [place], [place] [address]    Culture  Setup Time   Final    GRAM POSITIVE COCCI IN BOTH AEROBIC AND ANAEROBIC BOTTLES CRITICAL VALUE NOTED.  VALUE IS CONSISTENT WITH PREVIOUSLY REPORTED AND CALLED VALUE. Performed at [facility], [address] [place], [place] [address]    Culture (A)  Final    ENTEROCOCCUS FAECALIS STAPHYLOCOCCUS AUREUS SUSCEPTIBILITIES PERFORMED ON PREVIOUS CULTURE WITHIN THE LAST 5 DAYS. Performed at [facility], [place] [address] [place], [place] [address]    Report Status 11/15/2020 FINAL  Final  Resp Panel by RT-PCR (Flu A&B, Covid) Nasopharyngeal Swab     Status: None   Collection Time: 11/11/20  6:45 PM   Specimen: Nasopharyngeal Swab; Nasopharyngeal(NP) swabs in vial transport medium  Result Value Ref Range Status   SARS Coronavirus 2 by RT PCR NEGATIVE NEGATIVE Final    Comment: (NOTE) SARS-CoV-2 target nucleic acids are NOT DETECTED.  The SARS-CoV-2 RNA is generally detectable in upper respiratory specimens during the acute phase of infection. The lowest concentration of SARS-CoV-2 viral copies this assay can detect is 138 copies/mL. A negative result does not preclude SARS-Cov-2 infection and should not be used as the sole basis for treatment or other patient management decisions. A negative result may occur with  improper specimen collection/handling, submission of specimen other than nasopharyngeal swab, presence of viral mutation(s) within the areas targeted by this assay, and inadequate number of viral copies(<138 copies/mL). A negative result must be combined with clinical observations, patient history, and epidemiological information. The expected result is Negative.  Fact Sheet for Patients:  EntrepreneurPulse.com.au  Fact Sheet for Healthcare Providers:  IncredibleEmployment.be  This test is no t yet approved or  cleared by the [place] FDA and  has been authorized for detection and/or diagnosis of SARS-CoV-2 by FDA under an Emergency Use Authorization (EUA). This EUA will remain  in effect (meaning this test can be used) for the duration of the COVID-19 declaration under Section 564(b)(1) of the Act, 21 U.S.C.section 360bbb-3(b)(1), unless the authorization is terminated  or revoked sooner.       Influenza A by PCR NEGATIVE NEGATIVE Final   Influenza B by PCR NEGATIVE NEGATIVE Final    Comment: (NOTE) The  Xpert Xpress SARS-CoV-2/FLU/RSV plus assay is intended as an aid in the diagnosis of influenza from Nasopharyngeal swab specimens and should not be used as a sole basis for treatment. Nasal washings and aspirates are unacceptable for Xpert Xpress SARS-CoV-2/FLU/RSV testing.  Fact Sheet for Patients: EntrepreneurPulse.com.au  Fact Sheet for Healthcare Providers: IncredibleEmployment.be  This test is not yet approved or cleared by the [place] FDA and has been authorized for detection and/or diagnosis of SARS-CoV-2 by FDA under an Emergency Use Authorization (EUA). This EUA will remain in effect (meaning this test can be used) for the duration of the COVID-19 declaration under Section 564(b)(1) of the Act, 21 U.S.C. section 360bbb-3(b)(1), unless the authorization is terminated or revoked.  Performed at [facility], [address], [place], [place] [address]   Aerobic/Anaerobic Culture w Gram Stain (surgical/deep wound)     Status: None   Collection Time: 11/12/20  1:01 PM   Specimen: Wound  Result Value Ref Range Status   Specimen Description   Final    WOUND Performed at [facility], [address] [place], [place] [address]    Special Requests   Final    LEFT FOOT Performed at [facility], [address] [place], [place] [address]    Gram Stain NO WBC SEEN ABUNDANT GRAM POSITIVE COCCI   Final    Culture   Final    ABUNDANT STAPHYLOCOCCUS AUREUS MODERATE ENTEROCOCCUS FAECALIS FEW PREVOTELLA BIVIA BETA LACTAMASE POSITIVE Performed at [facility], [place] [address], [place], [place] [address]    Report Status 11/16/2020 FINAL  Final   Organism ID, Bacteria STAPHYLOCOCCUS AUREUS  Final   Organism ID, Bacteria ENTEROCOCCUS FAECALIS  Final      Susceptibility   Enterococcus faecalis - MIC*    AMPICILLIN <=2 SENSITIVE Sensitive     VANCOMYCIN 1 SENSITIVE Sensitive     GENTAMICIN SYNERGY SENSITIVE Sensitive     * MODERATE ENTEROCOCCUS FAECALIS   Staphylococcus aureus - MIC*    CIPROFLOXACIN <=0.5 SENSITIVE Sensitive     ERYTHROMYCIN >=8 RESISTANT Resistant     GENTAMICIN <=0.5 SENSITIVE Sensitive     OXACILLIN <=0.25 SENSITIVE Sensitive     TETRACYCLINE <=1 SENSITIVE Sensitive     VANCOMYCIN 1 SENSITIVE Sensitive     TRIMETH/SULFA <=10 SENSITIVE Sensitive     CLINDAMYCIN RESISTANT Resistant     RIFAMPIN <=0.5 SENSITIVE Sensitive     Inducible Clindamycin POSITIVE Resistant     * ABUNDANT STAPHYLOCOCCUS AUREUS  Culture, blood (Routine X 2) w Reflex to ID Panel     Status: Abnormal   Collection Time: 11/12/20 11:53 PM   Specimen: BLOOD  Result Value Ref Range Status   Specimen Description   Final    BLOOD BLOOD LEFT HAND Performed at [facility], [address] [place], [place] [address]    Special Requests   Final    BOTTLES DRAWN AEROBIC AND ANAEROBIC Blood Culture adequate volume Performed at [facility], [address] [place], [place] [address]    Culture  Setup Time   Final    Organism ID to follow ANAEROBIC BOTTLE ONLY GRAM POSITIVE COCCI CRITICAL RESULT CALLED TO, READ BACK BY AND VERIFIED WITH: SUSAN WATSON 11/13/20 1629 KLW Performed at [facility], [address] [place],  [address]    Culture (A)  Final    ENTEROCOCCUS FAECALIS SUSCEPTIBILITIES PERFORMED ON PREVIOUS CULTURE WITHIN THE LAST 5 DAYS. Performed  at [facility], [address] [place], [place] [address]    Report Status 11/16/2020 FINAL  Final  Culture, blood (Routine X 2) w Reflex to ID Panel     Status: None   Collection Time: 11/12/20 11:53 PM   Specimen: BLOOD  Result Value Ref Range Status   Specimen Description BLOOD BLOOD RIGHT HAND  Final   Special Requests   Final    BOTTLES DRAWN AEROBIC AND ANAEROBIC Blood Culture results may not be optimal due to an inadequate volume of blood received in culture bottles   Culture   Final    NO GROWTH 5 DAYS Performed at [facility], [address] [place], [place] [address]    Report Status 11/18/2020 FINAL  Final  Blood Culture ID Panel (Reflexed)     Status: Abnormal   Collection Time: 11/12/20 11:53 PM  Result Value Ref Range Status   Enterococcus faecalis DETECTED (A) NOT DETECTED Final    Comment: CRITICAL RESULT CALLED TO, READ BACK BY AND VERIFIED WITH: SUSAN WATSON 11/13/20 1629 KLW    Enterococcus Faecium NOT DETECTED NOT DETECTED Final   Listeria monocytogenes NOT DETECTED NOT DETECTED Final   Staphylococcus species NOT DETECTED NOT DETECTED Final   Staphylococcus aureus (BCID) NOT DETECTED NOT DETECTED Final   Staphylococcus epidermidis NOT DETECTED NOT DETECTED Final   Staphylococcus lugdunensis NOT DETECTED NOT DETECTED Final   Streptococcus species NOT DETECTED NOT DETECTED Final   Streptococcus agalactiae NOT DETECTED NOT DETECTED Final   Streptococcus pneumoniae NOT DETECTED NOT DETECTED Final   Streptococcus pyogenes NOT DETECTED NOT DETECTED Final   A.calcoaceticus-baumannii NOT DETECTED NOT DETECTED Final   Bacteroides fragilis NOT DETECTED NOT DETECTED Final   Enterobacterales NOT DETECTED NOT DETECTED Final   Enterobacter cloacae complex NOT DETECTED NOT DETECTED Final   Escherichia coli NOT DETECTED NOT DETECTED Final   Klebsiella aerogenes NOT DETECTED NOT DETECTED Final   Klebsiella oxytoca NOT DETECTED NOT DETECTED Final    Klebsiella pneumoniae NOT DETECTED NOT DETECTED Final   Proteus species NOT DETECTED NOT DETECTED Final   Salmonella species NOT DETECTED NOT DETECTED Final   Serratia marcescens NOT DETECTED NOT DETECTED Final   Haemophilus influenzae NOT DETECTED NOT DETECTED Final   Neisseria meningitidis NOT DETECTED NOT DETECTED Final   Pseudomonas aeruginosa NOT DETECTED NOT DETECTED Final   Stenotrophomonas maltophilia NOT DETECTED NOT DETECTED Final   Candida albicans NOT DETECTED NOT DETECTED Final   Candida auris NOT DETECTED NOT DETECTED Final   Candida glabrata NOT DETECTED NOT DETECTED Final   Candida krusei NOT DETECTED NOT DETECTED Final   Candida parapsilosis NOT DETECTED NOT DETECTED Final   Candida tropicalis NOT DETECTED NOT DETECTED Final   Cryptococcus neoformans/gattii NOT DETECTED NOT DETECTED Final   Vancomycin resistance NOT DETECTED NOT DETECTED Final    Comment: Performed at [facility], [address] [place], [place] [address]  CULTURE, BLOOD (ROUTINE X 2) w Reflex to ID Panel     Status: None (Preliminary result)   Collection Time: 11/14/20 12:04 AM   Specimen: BLOOD  Result Value Ref Range Status   Specimen Description BLOOD BLOOD RIGHT HAND  Final   Special Requests   Final    BOTTLES DRAWN AEROBIC AND ANAEROBIC Blood Culture adequate volume   Culture   Final    NO GROWTH 4 DAYS Performed at [facility], [address] [place],  [address]    Report Status PENDING  Incomplete  CULTURE, BLOOD (ROUTINE X 2) w Reflex  to ID Panel     Status: None (Preliminary result)   Collection Time: 11/14/20 12:14 AM   Specimen: BLOOD  Result Value Ref Range Status   Specimen Description BLOOD BLOOD LEFT HAND  Final   Special Requests   Final    BOTTLES DRAWN AEROBIC AND ANAEROBIC Blood Culture results may not be optimal due to an excessive volume of blood received in culture bottles   Culture   Final    NO GROWTH 4 DAYS Performed at [facility], [address], [place], [place] [address]    Report Status PENDING  Incomplete  Culture, blood (Routine X 2) w Reflex to ID Panel     Status: Abnormal   Collection Time: 11/15/20  4:17 AM   Specimen: BLOOD  Result Value Ref Range Status   Specimen Description   Final    BLOOD BLOOD RIGHT HAND Performed at [facility], [address], [place], [place] [address]    Special Requests   Final    BOTTLES DRAWN AEROBIC AND ANAEROBIC Blood Culture adequate volume Performed at [facility], [address] [place], [place] [address]    Culture  Setup Time   Final    ANAEROBIC BOTTLE ONLY GRAM POSITIVE COCCI CRITICAL VALUE NOTED.  VALUE IS CONSISTENT WITH PREVIOUSLY REPORTED AND CALLED VALUE. Performed at [facility], [address] [place], [place] [address]    Culture (A)  Final    ENTEROCOCCUS FAECALIS SUSCEPTIBILITIES PERFORMED ON PREVIOUS CULTURE WITHIN THE LAST 5 DAYS. Performed at [facility], [place] [address] [place], [place] [address]    Report Status 11/18/2020 FINAL  Final  Culture, blood (Routine X 2) w Reflex to ID Panel     Status: Abnormal   Collection Time: 11/15/20  4:18 AM   Specimen: BLOOD  Result Value Ref Range Status   Specimen Description   Final    BLOOD BLOOD LEFT HAND Performed at [facility], [address], [place], [place] [address]    Special Requests   Final    BOTTLES DRAWN AEROBIC AND ANAEROBIC Blood Culture adequate volume Performed at [facility], [address] [place], [place] [address]    Culture  Setup Time   Final    IN BOTH AEROBIC AND ANAEROBIC BOTTLES GRAM POSITIVE COCCI CRITICAL RESULT CALLED TO, READ BACK BY AND VERIFIED WITHOneita Kras [facility] AT 2235 11/15/20 MF Performed at [facility], [place] [address] [place], [place] [address]    Culture ENTEROCOCCUS FAECALIS (A)  Final   Report Status 11/18/2020 FINAL  Final   Organism ID, Bacteria ENTEROCOCCUS FAECALIS   Final      Susceptibility   Enterococcus faecalis - MIC*    AMPICILLIN <=2 SENSITIVE Sensitive     VANCOMYCIN 1 SENSITIVE Sensitive     GENTAMICIN SYNERGY SENSITIVE Sensitive     * ENTEROCOCCUS FAECALIS  Blood Culture ID Panel (Reflexed)     Status: Abnormal   Collection Time: 11/15/20  4:18 AM  Result Value Ref Range Status   Enterococcus faecalis DETECTED (A) NOT DETECTED Final    Comment: CRITICAL RESULT CALLED TO, READ BACK BY AND VERIFIED WITH: KARISSA [facility] AT 2235 11/15/20 MF.    Enterococcus Faecium NOT DETECTED NOT DETECTED Final   Listeria monocytogenes NOT DETECTED NOT DETECTED Final   Staphylococcus species NOT DETECTED NOT DETECTED Final   Staphylococcus aureus (BCID) NOT DETECTED NOT DETECTED Final   Staphylococcus epidermidis NOT DETECTED NOT DETECTED  Final   Staphylococcus lugdunensis NOT DETECTED NOT DETECTED Final   Streptococcus species NOT DETECTED NOT DETECTED Final   Streptococcus agalactiae NOT DETECTED NOT DETECTED Final   Streptococcus pneumoniae NOT DETECTED NOT DETECTED Final   Streptococcus pyogenes NOT DETECTED NOT DETECTED Final   A.calcoaceticus-baumannii NOT DETECTED NOT DETECTED Final   Bacteroides fragilis NOT DETECTED NOT DETECTED Final   Enterobacterales NOT DETECTED NOT DETECTED Final   Enterobacter cloacae complex NOT DETECTED NOT DETECTED Final   Escherichia coli NOT DETECTED NOT DETECTED Final   Klebsiella aerogenes NOT DETECTED NOT DETECTED Final   Klebsiella oxytoca NOT DETECTED NOT DETECTED Final   Klebsiella pneumoniae NOT DETECTED NOT DETECTED Final   Proteus species NOT DETECTED NOT DETECTED Final   Salmonella species NOT DETECTED NOT DETECTED Final   Serratia marcescens NOT DETECTED NOT DETECTED Final   Haemophilus influenzae NOT DETECTED NOT DETECTED Final   Neisseria meningitidis NOT DETECTED NOT DETECTED Final   Pseudomonas aeruginosa NOT DETECTED NOT DETECTED Final   Stenotrophomonas maltophilia NOT DETECTED NOT DETECTED Final    Candida albicans NOT DETECTED NOT DETECTED Final   Candida auris NOT DETECTED NOT DETECTED Final   Candida glabrata NOT DETECTED NOT DETECTED Final   Candida krusei NOT DETECTED NOT DETECTED Final   Candida parapsilosis NOT DETECTED NOT DETECTED Final   Candida tropicalis NOT DETECTED NOT DETECTED Final   Cryptococcus neoformans/gattii NOT DETECTED NOT DETECTED Final   Vancomycin resistance NOT DETECTED NOT DETECTED Final    Comment: Performed at [facility], [address] [place], [place] [address]  Culture, blood (Routine X 2) w Reflex to ID Panel     Status: Abnormal (Preliminary result)   Collection Time: 11/16/20  8:19 AM   Specimen: BLOOD  Result Value Ref Range Status   Specimen Description   Final    BLOOD RIGHT ARM Performed at [facility], [address], [place], [place] [address]    Special Requests   Final    BOTTLES DRAWN AEROBIC AND ANAEROBIC Blood Culture adequate volume Performed at [facility], [address] [place], [place] [address]    Culture  Setup Time   Final    GRAM POSITIVE COCCI IN BOTH AEROBIC AND ANAEROBIC BOTTLES CRITICAL VALUE NOTED.  VALUE IS CONSISTENT WITH PREVIOUSLY REPORTED AND CALLED VALUE. Performed at [facility], [address] [place], [place] [address]    Culture (A)  Final    ENTEROCOCCUS FAECALIS SUSCEPTIBILITIES PERFORMED ON PREVIOUS CULTURE WITHIN THE LAST 5 DAYS. Performed at [facility], [place] [address] [place], [place] [address]    Report Status PENDING  Incomplete  Culture, blood (Routine X 2) w Reflex to ID Panel     Status: Abnormal (Preliminary result)   Collection Time: 11/16/20  8:36 AM   Specimen: BLOOD  Result Value Ref Range Status   Specimen Description   Final    BLOOD LEFT HAND Performed at [facility], [address], [place], [place] [address]    Special Requests   Final    BOTTLES DRAWN AEROBIC AND ANAEROBIC Blood Culture adequate  volume Performed at [facility], [address] [place],  [address]    Culture  Setup Time   Final    GRAM POSITIVE COCCI IN BOTH AEROBIC AND ANAEROBIC BOTTLES CRITICAL RESULT CALLED TO, READ BACK BY AND VERIFIED WITH: JASON ROBINS @0029  ON 11/17/20 SKL    Culture (A)  Final    ENTEROCOCCUS FAECALIS SUSCEPTIBILITIES PERFORMED  ON PREVIOUS CULTURE WITHIN THE LAST 5 DAYS. Performed at [facility], [place] [address], [place], [place] [address]    Report Status PENDING  Incomplete  Blood Culture ID Panel (Reflexed)     Status: Abnormal   Collection Time: 11/16/20  8:36 AM  Result Value Ref Range Status   Enterococcus faecalis DETECTED (A) NOT DETECTED Final    Comment: CRITICAL RESULT CALLED TO, READ BACK BY AND VERIFIED WITH: JASON ROBINS @0029  ON 11/17/20 SKL    Enterococcus Faecium NOT DETECTED NOT DETECTED Final   Listeria monocytogenes NOT DETECTED NOT DETECTED Final   Staphylococcus species NOT DETECTED NOT DETECTED Final   Staphylococcus aureus (BCID) NOT DETECTED NOT DETECTED Final   Staphylococcus epidermidis NOT DETECTED NOT DETECTED Final   Staphylococcus lugdunensis NOT DETECTED NOT DETECTED Final   Streptococcus species NOT DETECTED NOT DETECTED Final   Streptococcus agalactiae NOT DETECTED NOT DETECTED Final   Streptococcus pneumoniae NOT DETECTED NOT DETECTED Final   Streptococcus pyogenes NOT DETECTED NOT DETECTED Final   A.calcoaceticus-baumannii NOT DETECTED NOT DETECTED Final   Bacteroides fragilis NOT DETECTED NOT DETECTED Final   Enterobacterales NOT DETECTED NOT DETECTED Final   Enterobacter cloacae complex NOT DETECTED NOT DETECTED Final   Escherichia coli NOT DETECTED NOT DETECTED Final   Klebsiella aerogenes NOT DETECTED NOT DETECTED Final   Klebsiella oxytoca NOT DETECTED NOT DETECTED Final   Klebsiella pneumoniae NOT DETECTED NOT DETECTED Final   Proteus species NOT DETECTED NOT DETECTED Final   Salmonella species NOT DETECTED NOT  DETECTED Final   Serratia marcescens NOT DETECTED NOT DETECTED Final   Haemophilus influenzae NOT DETECTED NOT DETECTED Final   Neisseria meningitidis NOT DETECTED NOT DETECTED Final   Pseudomonas aeruginosa NOT DETECTED NOT DETECTED Final   Stenotrophomonas maltophilia NOT DETECTED NOT DETECTED Final   Candida albicans NOT DETECTED NOT DETECTED Final   Candida auris NOT DETECTED NOT DETECTED Final   Candida glabrata NOT DETECTED NOT DETECTED Final   Candida krusei NOT DETECTED NOT DETECTED Final   Candida parapsilosis NOT DETECTED NOT DETECTED Final   Candida tropicalis NOT DETECTED NOT DETECTED Final   Cryptococcus neoformans/gattii NOT DETECTED NOT DETECTED Final   Vancomycin resistance NOT DETECTED NOT DETECTED Final    Comment: Performed at [facility], [address] [place], [place] [address]  Culture, blood (Routine X 2) w Reflex to ID Panel     Status: None (Preliminary result)   Collection Time: 11/18/20 12:03 AM   Specimen: BLOOD RIGHT FOREARM  Result Value Ref Range Status   Specimen Description BLOOD RIGHT FOREARM  Final   Special Requests   Final    BOTTLES DRAWN AEROBIC AND ANAEROBIC Blood Culture adequate volume   Culture   Final    NO GROWTH < 12 HOURS Performed at [facility], [address] [place], [place] [address]    Report Status PENDING  Incomplete  Culture, blood (Routine X 2) w Reflex to ID Panel     Status: None (Preliminary result)   Collection Time: 11/18/20 12:03 AM   Specimen: BLOOD RIGHT HAND  Result Value Ref Range Status   Specimen Description BLOOD RIGHT HAND  Final   Special Requests   Final    BOTTLES DRAWN AEROBIC AND ANAEROBIC Blood Culture adequate volume   Culture   Final    NO GROWTH < 12 HOURS Performed at [facility], [address] [place],  [address]    Report Status PENDING  Incomplete  Radiology Studies: PERIPHERAL VASCULAR CATHETERIZATION  Result Date: 11/17/2020 See op  note       Scheduled Meds: . aspirin EC  81 mg Oral Daily  . clopidogrel  75 mg Oral Daily  . collagenase   Topical Daily  . enoxaparin (LOVENOX) injection  40 mg Subcutaneous Q24H  . fluticasone  2 spray Each Nare Daily  . gabapentin  300 mg Oral TID  . insulin aspart  0-15 Units Subcutaneous TID WC  . insulin aspart  0-5 Units Subcutaneous QHS  . mometasone-formoterol  2 puff Inhalation BID  . oxybutynin  5 mg Oral BID  . pantoprazole  40 mg Oral Daily   Or  . pantoprazole (PROTONIX) IV  40 mg Intravenous Daily  . polyethylene glycol  17 g Oral Daily  . pravastatin  10 mg Oral q1800   Continuous Infusions: . sodium chloride 1,000 mL (11/17/20 1300)  . DAPTOmycin (CUBICIN)  IV 10 mL/hr at 11/18/20 1053  . vancomycin Stopped (11/18/20 1035)     LOS: 7 days   Time spent= 35 mins    Freedom Lopezperez Arsenio Loader, MD [facility]  If 7PM-7AM, please contact night-coverage  11/18/2020, 12:42 PM

## 2020-11-18 NOTE — Progress Notes (Signed)
Podiatry was asked to reevaluate patient's left heel as patient still has sepsis with positive blood cultures.  Underwent recent revascularization to the left lower extremity.  Patient had successful revascularization with decent peroneal artery runoff and the anterior tibial artery was found to be patent.  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  Evaluation of left heel shows a large eschar on the plantar aspect of the heel.  There is no surrounding erythema.  There is no purulent drainage.  No fluctuance with palpation.  Previous x-rays and MRI are negative for osteomyelitis to the left heel.  At this point the plantar left heel does not appear to be acutely infected.  There is no signs of osteomyelitis on x-ray.  There is no palpable fluctuant areas.  No necrotic tissue or drainage whatsoever from the ulcer.  There does not appear to be an acute infection in this area.  For now would hold on debridement of this heel ulcer unless this does become obviously infected.  We can consider further work-up with this down the road.  I applied a foam pad with silicone border for the heel to remove pressure to the wound today.

## 2020-11-19 DIAGNOSIS — L089 Local infection of the skin and subcutaneous tissue, unspecified: Secondary | ICD-10-CM | POA: Insufficient documentation

## 2020-11-19 DIAGNOSIS — I10 Essential (primary) hypertension: Secondary | ICD-10-CM | POA: Diagnosis not present

## 2020-11-19 DIAGNOSIS — I739 Peripheral vascular disease, unspecified: Secondary | ICD-10-CM | POA: Diagnosis not present

## 2020-11-19 DIAGNOSIS — B9561 Methicillin susceptible Staphylococcus aureus infection as the cause of diseases classified elsewhere: Secondary | ICD-10-CM | POA: Diagnosis not present

## 2020-11-19 DIAGNOSIS — E11628 Type 2 diabetes mellitus with other skin complications: Secondary | ICD-10-CM | POA: Insufficient documentation

## 2020-11-19 DIAGNOSIS — R7881 Bacteremia: Secondary | ICD-10-CM | POA: Diagnosis not present

## 2020-11-19 DIAGNOSIS — B952 Enterococcus as the cause of diseases classified elsewhere: Secondary | ICD-10-CM | POA: Diagnosis not present

## 2020-11-19 DIAGNOSIS — A419 Sepsis, unspecified organism: Secondary | ICD-10-CM | POA: Diagnosis not present

## 2020-11-19 LAB — CULTURE, BLOOD (ROUTINE X 2)
Culture: NO GROWTH
Culture: NO GROWTH
Special Requests: ADEQUATE
Special Requests: ADEQUATE

## 2020-11-19 LAB — COMPREHENSIVE METABOLIC PANEL
ALT: 18 U/L (ref 0–44)
AST: 19 U/L (ref 15–41)
Albumin: 3 g/dL — ABNORMAL LOW (ref 3.5–5.0)
Alkaline Phosphatase: 51 U/L (ref 38–126)
Anion gap: 7 (ref 5–15)
BUN: 13 mg/dL (ref 8–23)
CO2: 27 mmol/L (ref 22–32)
Calcium: 8.7 mg/dL — ABNORMAL LOW (ref 8.9–10.3)
Chloride: 99 mmol/L (ref 98–111)
Creatinine, Ser: 0.99 mg/dL (ref 0.61–1.24)
GFR, Estimated: >60 mL/min (ref >60)
Glucose, Bld: 213 mg/dL — ABNORMAL HIGH (ref 70–99)
Potassium: 4.5 mmol/L (ref 3.5–5.1)
Sodium: 133 mmol/L — ABNORMAL LOW (ref 135–145)
Total Bilirubin: 1.2 mg/dL (ref 0.3–1.2)
Total Protein: 7 g/dL (ref 6.5–8.1)

## 2020-11-19 LAB — GLUCOSE, CAPILLARY
Glucose-Capillary: 181 mg/dL — ABNORMAL HIGH (ref 70–99)
Glucose-Capillary: 204 mg/dL — ABNORMAL HIGH (ref 70–99)
Glucose-Capillary: 204 mg/dL — ABNORMAL HIGH (ref 70–99)
Glucose-Capillary: 128 mg/dL — ABNORMAL HIGH (ref 70–99)

## 2020-11-19 LAB — CBC
HCT: 34.2 % — ABNORMAL LOW (ref 39.0–52.0)
Hemoglobin: 11.2 g/dL — ABNORMAL LOW (ref 13.0–17.0)
MCH: 28.3 pg (ref 26.0–34.0)
MCHC: 32.7 g/dL (ref 30.0–36.0)
MCV: 86.4 fL (ref 80.0–100.0)
Platelets: 176 10*3/uL (ref 150–400)
RBC: 3.96 MIL/uL — ABNORMAL LOW (ref 4.22–5.81)
RDW: 14.1 % (ref 11.5–15.5)
WBC: 4.9 10*3/uL (ref 4.0–10.5)
nRBC: 0 % (ref 0.0–0.2)

## 2020-11-19 LAB — VANCOMYCIN, PEAK: Vancomycin Pk: 39 ug/mL (ref 30–40)

## 2020-11-19 LAB — MAGNESIUM: Magnesium: 2.1 mg/dL (ref 1.7–2.4)

## 2020-11-19 LAB — SEDIMENTATION RATE: Sed Rate: 58 mm/hr — ABNORMAL HIGH (ref 0–20)

## 2020-11-19 LAB — VANCOMYCIN, TROUGH: Vancomycin Tr: 19 ug/mL (ref 15–20)

## 2020-11-19 MED ORDER — VANCOMYCIN HCL 1000 MG/200ML IV SOLN
1000.0000 mg | Freq: Two times a day (BID) | INTRAVENOUS | Status: DC
  Administered 2020-11-20 – 2020-11-24 (×9): 1000 mg via INTRAVENOUS
  Filled 2020-11-19 (×12): qty 200

## 2020-11-19 NOTE — Progress Notes (Signed)
Pharmacy Antibiotic Note  [name] is a 70 y.o. male admitted on 11/11/2020 with bacteremia. Pt presented with L foot wounds, s/p debridement on 2/16. Pt has a hx of toe amputations. Pt has an allergy to penicillins with reaction of severe mouth ulcers occurring with both penicillin and ampicillin. 2/15 blood cultures positive for enterococcus faecalis and MSSA. 2/16 wound culture with MSSA, e. Faecalis, and prevotella bivia. Repeat blood cultures from 2/16, 2/19, and 2/20 with e. faecalis. TEE on 2/18 negative for vegetation. No osteomyelitis or septic arthritis per MRI. LLE angiogram completed 2/21. Awaiting PICC placement for IV abx x 6 weeks. Pt was on daptomycin now transitioned to vancomycin due to uncontrolled infection with repeat positive blood cultures. Pharmacy has been consulted for vancomycin dosing.   Day 8 abx  2/23 vanc pk @ 6314: 39 2/23 vanc tr  @  2133: 19  Plan: Discontinued daptomycin 650 mg daily since therapeutic on vancomycin Continue Metronidazole 500 mg TID   Vancomycin IV 1250mg  was administered prior to Vancomycin trough resulting, therefore dose was unable to be adjusted as desired. Will adjust dose to Vancomycin IV 1g Q12 hours, with first dose to be given@1000  on 11/20/20 Expected AUC: 561.3 SCr used: 0.99  --Levels at steady state as indicated --Daily Scr per protocol while on vancomycin  Height: 5\' 10"  (177.8 cm) Weight: 82 kg (180 lb 12.4 oz) IBW/kg (Calculated) : 73  Temp (24hrs), Avg:98.2 F (36.8 C), Min:97.5 F (36.4 C), Max:98.7 F (37.1 C)  Recent Labs  Lab 11/15/20 0417 11/16/20 0523 11/17/20 0458 11/18/20 0003 11/19/20 0424 11/19/20 1222 11/19/20 2133  WBC 4.9 4.7 4.8 5.4 4.9  --   --   CREATININE 0.85 0.83 0.85 0.86 0.99  --   --   VANCOTROUGH  --   --   --   --   --   --  19  VANCOPEAK  --   --   --   --   --  39  --     Estimated Creatinine Clearance: 74.8 mL/min (by C-G formula based on SCr of 0.99 mg/dL).    Allergies   Allergen Reactions  . Penicillins Rash and Other (See Comments)    Rash in and around the mouth Has patient had a PCN reaction causing immediate rash, facial/tongue/throat swelling, SOB or lightheadedness with hypotension: Yes Has patient had a PCN reaction causing severe rash involving mucus membranes or skin necrosis: Yes Has patient had a PCN reaction that required hospitalization: No Has patient had a PCN reaction occurring within the last 10 years: Yes If all of the above answers are "NO", then may proceed with Cephalosporin use.   . Bactrim [Sulfamethoxazole-Trimethoprim] Other (See Comments)    Mouth sores, Sores develop in mouth    Antimicrobials this admission: 2/15 Cefepime >> 2/16 2/15 Metronidazole x 1, 2/22 >> 2/15 Vancomycin >> 2/17, 2/21 >> 2/17 Daptomycin >> 2/22  Microbiology results: 2/15 BCx: E faecalis, MSSA 2/16 BCx: E faecalis 2/16 WCx: MSSA, E faecalis, Prevotella bivia 2/18 BCx: NGTD 2/19 BCx: E faecalis 2/20 BCx: E faecalis 2/22 BCx: NGTD  Thank you for allowing pharmacy to be a part of this patient's care.  Pearla Dubonnet, PharmD Clinical Pharmacist 11/19/2020 10:16 PM

## 2020-11-19 NOTE — Progress Notes (Signed)
Pharmacy Antibiotic Note  [name] is a 70 y.o. male admitted on 11/11/2020 with bacteremia. Pt presented with L foot wounds, s/p debridement on 2/16. Pt has a hx of toe amputations. Pt has an allergy to penicillins with reaction of severe mouth ulcers occurring with both penicillin and ampicillin. 2/15 blood cultures positive for enterococcus faecalis and MSSA. 2/16 wound culture with MSSA, e. Faecalis, and prevotella bivia. Repeat blood cultures from 2/16, 2/19, and 2/20 with e. faecalis. TEE on 2/18 negative for vegetation. No osteomyelitis or septic arthritis per MRI. LLE angiogram completed 2/21. Awaiting PICC placement for IV abx x 6 weeks. Pt was on daptomycin now transitioned to vancomycin due to uncontrolled infection with repeat positive blood cultures. Pharmacy has been consulted for vancomycin dosing.   Day 8 abx  2/23 vanc pk @ 1222: 39 2/23 vanc trough due @ 2130  Plan: Discontinue daptomycin 650 mg daily since therapeutic on vancomycin Continue Metronidazole 500 mg TID  Continue vancomycin 1250 mg IV q12h --Initial predicted AUC: 552, Cmin: 15.7 --Predicted AUC based on worsening renal function (Scr 0.86>0.99): 637.2, Cmin 19.2 --F/u trough levels @ 2130 for likely dose adjustment --Levels at steady state as indicated --Daily Scr per protocol while on vancomycin  Height: 5\' 10"  (177.8 cm) Weight: 82 kg (180 lb 12.4 oz) IBW/kg (Calculated) : 73  Temp (24hrs), Avg:98.2 F (36.8 C), Min:97.5 F (36.4 C), Max:98.7 F (37.1 C)  Recent Labs  Lab 11/15/20 0417 11/16/20 0523 11/17/20 0458 11/18/20 0003 11/19/20 0424 11/19/20 1222  WBC 4.9 4.7 4.8 5.4 4.9  --   CREATININE 0.85 0.83 0.85 0.86 0.99  --   VANCOPEAK  --   --   --   --   --  39    Estimated Creatinine Clearance: 74.8 mL/min (by C-G formula based on SCr of 0.99 mg/dL).    Allergies  Allergen Reactions  . Penicillins Rash and Other (See Comments)    Rash in and around the mouth Has patient had a PCN  reaction causing immediate rash, facial/tongue/throat swelling, SOB or lightheadedness with hypotension: Yes Has patient had a PCN reaction causing severe rash involving mucus membranes or skin necrosis: Yes Has patient had a PCN reaction that required hospitalization: No Has patient had a PCN reaction occurring within the last 10 years: Yes If all of the above answers are "NO", then may proceed with Cephalosporin use.   . Bactrim [Sulfamethoxazole-Trimethoprim] Other (See Comments)    Mouth sores, Sores develop in mouth    Antimicrobials this admission: 2/15 Cefepime >> 2/16 2/15 Metronidazole x 1, 2/22 >> 2/15 Vancomycin >> 2/17, 2/21 >> 2/17 Daptomycin >> 2/22  Microbiology results: 2/15 BCx: E faecalis, MSSA 2/16 BCx: E faecalis 2/16 WCx: MSSA, E faecalis, Prevotella bivia 2/18 BCx: NGTD 2/19 BCx: E faecalis 2/20 BCx: E faecalis 2/21 Bcx: pending 2/22 BCx: NGTD  Thank you for allowing pharmacy to be a part of this patient's care.  Benn Moulder, PharmD Pharmacy Resident  11/19/2020 5:01 PM

## 2020-11-19 NOTE — Progress Notes (Signed)
Inpatient Diabetes Program Recommendations  AACE/ADA: New Consensus Statement on Inpatient Glycemic Control (2015)  Target Ranges:  Prepandial:   less than 140 mg/dL      Peak postprandial:   less than 180 mg/dL (1-2 hours)      Critically ill patients:  140 - 180 mg/dL   Lab Results  Component Value Date   GLUCAP 204 (H) 11/19/2020   HGBA1C 8.9 (H) 11/13/2020    Review of Glycemic Control Results for [name], [name] (MRN [id number]) as of 11/19/2020 13:30  Ref. Range 11/18/2020 12:12 11/18/2020 17:35 11/18/2020 22:21 11/19/2020 07:42 11/19/2020 11:09  Glucose-Capillary Latest Ref Range: 70 - 99 mg/dL 144 (H) 116 (H) 148 (H) 181 (H) 204 (H)   Diabetes history: DM 2 Outpatient Diabetes medications:  Glyburide 5 mg bid Current orders for Inpatient glycemic control:  Novolog moderate tid with meals and HS Inpatient Diabetes Program Recommendations:   May consider adding Levemir 8 units daily while in the hospital.   Thanks,  Adah Perl, RN, BC-ADM Inpatient Diabetes Coordinator Pager [phone] (8a-5p)

## 2020-11-19 NOTE — Progress Notes (Signed)
Physical Therapy Treatment Patient Details Name: [name] MRN: [id number] DOB: 1951/07/31 Today's Date: 11/19/2020    History of Present Illness Jamerson Vonbargen is a 70 y.o. male with medical history significant for hypertension, nonischemic cardiomyopathy, non-insulin-dependent DM, peripheral neuropathy, and PAD, who presents to the ED following a visit with his PCP for SOB and swelling and pain in L LE. He endorses weakness and shortness of breath with exertion and increased sleeping and poor PO intake. He reports this has been ongoing for the last 2 weeks. He further endorses nausea and diarrhea x 2 weeks, with worsening the in last 2 days prompting evaluation at PCP. Pt reports unchanged baseline dry cough. He denies chest pain, abdominal pain, dysuria, hematuria. He endorses left lower extremity edema that has been ongoing for the last 2 weeks and worsening in the last 2 days. At PCP, he was advised to present to the ED due to his left lower extremity swelling and shortness of breath. Pt reports he was aware of the diabetic foot ulcer on the medial great toe, however he was unaware of the wound on the left heel.    PT Comments    Pt was sitting in recliner upon arriving. He agrees to PT session and is cooperative and pleasant throughout. He was easily able to stand and ambulate with RW however has poor ability to maintain proper wt bearing. Pt also had old style surgical shoe donned. When ambulating, surgical show was pistoning. Author went and acquired new style surgical shoe. Much improved gait kinematics however continues to struggle with proper wt bearing. After ambulation, lengthy discussion about importance of adhering to wt bearing restrictions. Proper positioning, and need to keep performing there ex even when sitting/laying in bed to promote circulation + promote healing. He states understanding. Recommend DC home with HHPT to follow.     Follow Up Recommendations  Home health PT      Equipment Recommendations  Rolling walker with 5" wheels    Recommendations for Other Services       Precautions / Restrictions Precautions Precautions: Fall Restrictions Weight Bearing Restrictions: Yes LLE Weight Bearing:  (NWB on heel of LLE/ TTWB on forefoot) Other Position/Activity Restrictions:  (Pt with poor support + surgical shoe(old style) pistoning during ambulation. Issued new appropriate surgical shoe to be worn. Much improve suspension however pt continues to struggle to maintain proper wt bearing with ambulation)    Mobility  Bed Mobility Overal bed mobility: Modified Independent    Transfers Overall transfer level: Modified independent Equipment used: Rolling walker (2 wheeled) Transfers: Sit to/from Stand Sit to Stand: Modified independent (Device/Increase time)         General transfer comment: no physical assistance however vcs for improved technique to maintain proper wt bearing  Ambulation/Gait Ambulation/Gait assistance: Supervision Gait Distance (Feet): 30 Feet Assistive device: Rolling walker (2 wheeled) Gait Pattern/deviations: Decreased step length - left;Decreased step length - right Gait velocity: decreased   General Gait Details: pt ambulated 2 x 30 ft. One trial with old style surgical shoe and 1 x with new style surgical shoe. much improved gait kinematics however max vcs for improved/proper wt bearing         Cognition Arousal/Alertness: Awake/alert Behavior During Therapy: WFL for tasks assessed/performed Overall Cognitive Status: Within Functional Limits for tasks assessed  General Comments: Pt is A and O x 4 and very cooperative. Poor ability to maintain proper wt bearing even with max vcs.  General Comments General comments (skin integrity, edema, etc.): Lengthy discussion about needing to keep LE elevated to promote healing. Also discussed needing to properly/maintain NWB on heel to promote healing.      Pertinent  Vitals/Pain Pain Assessment: No/denies pain Pain Score: 0-No pain           PT Goals (current goals can now be found in the care plan section) Acute Rehab PT Goals Patient Stated Goal: to go home Progress towards PT goals: Progressing toward goals    Frequency    Min 2X/week      PT Plan Current plan remains appropriate       AM-PAC PT "6 Clicks" Mobility   Outcome Measure  Help needed turning from your back to your side while in a flat bed without using bedrails?: None Help needed moving from lying on your back to sitting on the side of a flat bed without using bedrails?: None Help needed moving to and from a bed to a chair (including a wheelchair)?: None Help needed standing up from a chair using your arms (e.g., wheelchair or bedside chair)?: A Little Help needed to walk in hospital room?: A Little Help needed climbing 3-5 steps with a railing? : A Little 6 Click Score: 21    End of Session Equipment Utilized During Treatment: Other (comment) (surgical shoe) Activity Tolerance: Patient tolerated treatment well Patient left: in chair;with call bell/phone within reach;with chair alarm set Nurse Communication: Mobility status PT Visit Diagnosis: Unsteadiness on feet (R26.81);Difficulty in walking, not elsewhere classified (R26.2)     Time: 1240-1305 PT Time Calculation (min) (ACUTE ONLY): 25 min  Charges:  $Gait Training: 8-22 mins $Therapeutic Activity: 8-22 mins                     Julaine Fusi PTA 11/19/20, 1:31 PM

## 2020-11-19 NOTE — Progress Notes (Signed)
Date of Admission:  11/11/2020     ID: [name] is a 70 y.o. male  Principal Problem:   Sepsis ([place]) Active Problems:   PAD (peripheral artery disease) ([place])   Diabetes ([place])   Mixed hyperlipidemia   Essential hypertension   GERD (gastroesophageal reflux disease)   Uncontrolled type 2 diabetes mellitus with hyperglycemia ([facility])   Cardiomyopathy, nonischemic ([facility])   Primary generalized (osteo)arthritis   Moderate asthma without complication   Diabetic foot ulcer ([place])    Subjective: Patient doing fine No fever No pain  Medications:  . aspirin EC  81 mg Oral Daily  . clopidogrel  75 mg Oral Daily  . collagenase   Topical Daily  . docusate sodium  100 mg Oral BID  . enoxaparin (LOVENOX) injection  40 mg Subcutaneous Q24H  . fluticasone  2 spray Each Nare Daily  . gabapentin  300 mg Oral TID  . insulin aspart  0-15 Units Subcutaneous TID WC  . insulin aspart  0-5 Units Subcutaneous QHS  . metroNIDAZOLE  500 mg Oral 3 times per day  . mometasone-formoterol  2 puff Inhalation BID  . oxybutynin  5 mg Oral BID  . pantoprazole  40 mg Oral Daily  . polyethylene glycol  17 g Oral Daily  . pravastatin  10 mg Oral q1800    Objective: Vital signs in last 24 hours: Temp:  [97.5 F (36.4 C)-98.7 F (37.1 C)] 97.5 F (36.4 C) (02/23 1110) Pulse Rate:  [72-105] 90 (02/23 1110) Resp:  [18-20] 18 (02/23 0933) BP: (112-133)/(59-74) 113/72 (02/23 1110) SpO2:  [93 %-100 %] 93 % (02/23 1110)  PHYSICAL EXAM:  General: Alert, cooperative, no distress, pale Head: Normocephalic, without obvious abnormality, atraumatic. Eyes: Conjunctivae clear, anicteric sclerae. Pupils are equal ENT Nares normal. No drainage or sinus tenderness. Lips, mucosa, and tongue normal. No Thrush Neck: Supple, symmetrical, no adenopathy, thyroid: non tender no carotid bruit and no JVD. Back: No CVA tenderness. Lungs: Clear to auscultation bilaterally. No Wheezing or Rhonchi. No rales. Heart: Regular  rate and rhythm, no murmur, rub or gallop. Abdomen: Soft, non-tender,not distended. Bowel sounds normal. No masses Extremities:       Skin: No rashes or lesions. Or bruising Lymph: Cervical, supraclavicular normal. Neurologic: Grossly non-focal  Lab Results Recent Labs    11/18/20 0003 11/19/20 0424  WBC 5.4 4.9  HGB 10.1* 11.2*  HCT 31.0* 34.2*  NA 131* 133*  K 4.1 4.5  CL 99 99  CO2 24 27  BUN 10 13  CREATININE 0.86 0.99   Liver Panel Recent Labs    11/18/20 0003 11/19/20 0424  PROT 6.4* 7.0  ALBUMIN 2.7* 3.0*  AST 21 19  ALT 18 18  ALKPHOS 42 51  BILITOT 1.0 1.2   Sedimentation Rate Recent Labs    11/18/20 0003  ESRSEDRATE 58*   C-Reactive Protein Recent Labs    11/18/20 0003  CRP 2.1*     Microbiology: 11/11/2020 4 out of 4 blood culture positive for Enterococcus faecalis and staph aureus 11/12/2020 1 out of 4 bottle Enterococcus faecalis 11/12/2020 wound culture has Enterococcus and staph aureus and Prevotella brevia.  11/14/2020 blood culture no growth   11/15/2020 blood culture 3 out of 4 bottles  Enterococcus faecalis  11/16/2020 4 out of 4 bottles Enterococcus faecalis. 11/18/2020 blood culture sent Studies/Results: MR CERVICAL SPINE W WO CONTRAST  Result Date: 11/18/2020 CLINICAL DATA:  Cervical radiculopathy, infection suspected. Epidural abscess. Lumbar radiculopathy, cancer or infection suspected. EXAM:  MRI CERVICAL, THORACIC AND LUMBAR SPINE WITHOUT AND WITH CONTRAST TECHNIQUE: Multiplanar and multiecho pulse sequences of the cervical spine, to include the craniocervical junction and cervicothoracic junction, and thoracic and lumbar spine, were obtained without and with intravenous contrast. CONTRAST:  49mL GADAVIST GADOBUTROL 1 MMOL/ML IV SOLN COMPARISON:  None. FINDINGS: MRI CERVICAL SPINE FINDINGS Alignment: Straightening of the cervical curvature. Vertebrae: No fracture, evidence of discitis, or bone lesion. Cord: Mass effect on the cord  with flattening at the C4-5 level. No cord signal abnormality. Posterior Fossa, vertebral arteries, paraspinal tissues: Negative. Disc levels: C2-3: Small posterior disc protrusion resulting mild spinal canal stenosis. Facet degenerative changes resulting in moderate right neural foraminal narrowing. C3-4: Posterior disc protrusion without significant spinal canal stenosis. Uncovertebral and facet degenerative changes resulting in severe left neural foraminal narrowing. C4-5: Posterior disc protrusion resulting in moderate to severe spinal canal stenosis with mass effect on the cord without cord signal abnormality. Uncovertebral and facet degenerative changes resulting in moderate right and severe left neural foraminal narrowing. C5-6: Posterior disc protrusion resulting in mild spinal canal stenosis. Uncovertebral and facet degenerative changes resulting in severe right and mild left neural foraminal narrowing. C6-7: Posterior disc protrusion resulting in moderate spinal canal stenosis. Uncovertebral and facet degenerative changes resulting in severe bilateral neural foraminal narrowing. C7-T1: No spinal canal or neural foraminal stenosis. MRI THORACIC SPINE FINDINGS Alignment:  Physiologic. Vertebrae: No fracture, evidence of discitis, or bone lesion. Cord:  Normal signal and morphology. Paraspinal and other soft tissues: Small bilateral pleural effusion. Disc levels: At T9-10, a left posterolateral disc protrusion, facet degenerative changes with ligamentum flavum redundancy causes indentations of the thecal sac without significant spinal canal or neural foraminal stenosis. No significant disc herniation, spinal canal or neural foraminal stenosis in the other thoracic levels. MRI LUMBAR SPINE FINDINGS Segmentation: A transitional lumbosacral vertebra is assumed to represent a partially sacralized L5 level with rudimentary disc at L5-S1. Careful correlation with this numbering strategy prior to any procedural  intervention would be recommended. Alignment:  Minimal retrolisthesis of L1 over L2 and L3 over L4. Vertebrae: No fracture, evidence of discitis, or bone lesion. Loss of disc height with prominent endplate degenerative changes at L1-2 and L4-5. Conus medullaris and cauda equina: Conus extends to the L1 level. Conus and cauda equina appear normal. Paraspinal and other soft tissues: Negative. Disc levels: T12-L1: No spinal canal or neural foraminal stenosis. L1-2: Loss of disc height, disc bulge, moderate facet degenerative change ligamentum flavum redundancy resulting in mild to moderate spinal canal stenosis with narrowing of the bilateral subarticular zones and mild bilateral neural foraminal narrowing. L2-3: Shallow disc bulge and moderate facet degenerative changes resulting in mild spinal canal stenosis and mild right neural foraminal narrowing. L3-4: Shallow disc bulge with superimposed small central disc protrusion, moderate facet degenerative change ligamentum flavum redundancy resulting mild spinal canal stenosis with narrowing of the bilateral subarticular zones and mild bilateral neural foraminal narrowing. L4-5: Loss of disc height, disc bulge with associated osteophytic component and moderate facet degenerative changes resulting mild narrowing of the bilateral subarticular zones and moderate bilateral neural foraminal narrowing. L5-S1: No spinal canal or neural foraminal stenosis. IMPRESSION: 1. No evidence of discitis-osteomyelitis. 2. Multilevel degenerative changes of the cervical spine with moderate to severe spinal canal stenosis at C4-5 with mass effect on the cord without cord signal abnormality. Moderate spinal canal stenosis at C6-7. 3. No significant spinal canal or neural foraminal stenosis at the thoracic levels. 4. Transitional lumbosacral vertebra is assumed to  represent a partially sacralized L5 level with rudimentary disc at L5-S1. Careful correlation with this numbering strategy prior to  any procedural intervention would be recommended. 5. Multilevel degenerative changes of the lumbar spine with mild-to-moderate spinal canal stenosis at L1-2 and mild spinal canal stenosis at L2-3 and L3-4. There is narrowing of the bilateral subarticular zones at L1-2 and L4-5 with mild bilateral neural foraminal narrowing. 6. Small bilateral pleural effusion. Electronically Signed   By: Pedro Earls M.D.   On: 11/18/2020 16:11   MR THORACIC SPINE W WO CONTRAST  Result Date: 11/18/2020 CLINICAL DATA:  Cervical radiculopathy, infection suspected. Epidural abscess. Lumbar radiculopathy, cancer or infection suspected. EXAM: MRI CERVICAL, THORACIC AND LUMBAR SPINE WITHOUT AND WITH CONTRAST TECHNIQUE: Multiplanar and multiecho pulse sequences of the cervical spine, to include the craniocervical junction and cervicothoracic junction, and thoracic and lumbar spine, were obtained without and with intravenous contrast. CONTRAST:  57mL GADAVIST GADOBUTROL 1 MMOL/ML IV SOLN COMPARISON:  None. FINDINGS: MRI CERVICAL SPINE FINDINGS Alignment: Straightening of the cervical curvature. Vertebrae: No fracture, evidence of discitis, or bone lesion. Cord: Mass effect on the cord with flattening at the C4-5 level. No cord signal abnormality. Posterior Fossa, vertebral arteries, paraspinal tissues: Negative. Disc levels: C2-3: Small posterior disc protrusion resulting mild spinal canal stenosis. Facet degenerative changes resulting in moderate right neural foraminal narrowing. C3-4: Posterior disc protrusion without significant spinal canal stenosis. Uncovertebral and facet degenerative changes resulting in severe left neural foraminal narrowing. C4-5: Posterior disc protrusion resulting in moderate to severe spinal canal stenosis with mass effect on the cord without cord signal abnormality. Uncovertebral and facet degenerative changes resulting in moderate right and severe left neural foraminal narrowing. C5-6:  Posterior disc protrusion resulting in mild spinal canal stenosis. Uncovertebral and facet degenerative changes resulting in severe right and mild left neural foraminal narrowing. C6-7: Posterior disc protrusion resulting in moderate spinal canal stenosis. Uncovertebral and facet degenerative changes resulting in severe bilateral neural foraminal narrowing. C7-T1: No spinal canal or neural foraminal stenosis. MRI THORACIC SPINE FINDINGS Alignment:  Physiologic. Vertebrae: No fracture, evidence of discitis, or bone lesion. Cord:  Normal signal and morphology. Paraspinal and other soft tissues: Small bilateral pleural effusion. Disc levels: At T9-10, a left posterolateral disc protrusion, facet degenerative changes with ligamentum flavum redundancy causes indentations of the thecal sac without significant spinal canal or neural foraminal stenosis. No significant disc herniation, spinal canal or neural foraminal stenosis in the other thoracic levels. MRI LUMBAR SPINE FINDINGS Segmentation: A transitional lumbosacral vertebra is assumed to represent a partially sacralized L5 level with rudimentary disc at L5-S1. Careful correlation with this numbering strategy prior to any procedural intervention would be recommended. Alignment:  Minimal retrolisthesis of L1 over L2 and L3 over L4. Vertebrae: No fracture, evidence of discitis, or bone lesion. Loss of disc height with prominent endplate degenerative changes at L1-2 and L4-5. Conus medullaris and cauda equina: Conus extends to the L1 level. Conus and cauda equina appear normal. Paraspinal and other soft tissues: Negative. Disc levels: T12-L1: No spinal canal or neural foraminal stenosis. L1-2: Loss of disc height, disc bulge, moderate facet degenerative change ligamentum flavum redundancy resulting in mild to moderate spinal canal stenosis with narrowing of the bilateral subarticular zones and mild bilateral neural foraminal narrowing. L2-3: Shallow disc bulge and  moderate facet degenerative changes resulting in mild spinal canal stenosis and mild right neural foraminal narrowing. L3-4: Shallow disc bulge with superimposed small central disc protrusion, moderate facet degenerative change ligamentum  flavum redundancy resulting mild spinal canal stenosis with narrowing of the bilateral subarticular zones and mild bilateral neural foraminal narrowing. L4-5: Loss of disc height, disc bulge with associated osteophytic component and moderate facet degenerative changes resulting mild narrowing of the bilateral subarticular zones and moderate bilateral neural foraminal narrowing. L5-S1: No spinal canal or neural foraminal stenosis. IMPRESSION: 1. No evidence of discitis-osteomyelitis. 2. Multilevel degenerative changes of the cervical spine with moderate to severe spinal canal stenosis at C4-5 with mass effect on the cord without cord signal abnormality. Moderate spinal canal stenosis at C6-7. 3. No significant spinal canal or neural foraminal stenosis at the thoracic levels. 4. Transitional lumbosacral vertebra is assumed to represent a partially sacralized L5 level with rudimentary disc at L5-S1. Careful correlation with this numbering strategy prior to any procedural intervention would be recommended. 5. Multilevel degenerative changes of the lumbar spine with mild-to-moderate spinal canal stenosis at L1-2 and mild spinal canal stenosis at L2-3 and L3-4. There is narrowing of the bilateral subarticular zones at L1-2 and L4-5 with mild bilateral neural foraminal narrowing. 6. Small bilateral pleural effusion. Electronically Signed   By: Pedro Earls M.D.   On: 11/18/2020 16:11   MR Lumbar Spine W Wo Contrast  Result Date: 11/18/2020 CLINICAL DATA:  Cervical radiculopathy, infection suspected. Epidural abscess. Lumbar radiculopathy, cancer or infection suspected. EXAM: MRI CERVICAL, THORACIC AND LUMBAR SPINE WITHOUT AND WITH CONTRAST TECHNIQUE: Multiplanar and  multiecho pulse sequences of the cervical spine, to include the craniocervical junction and cervicothoracic junction, and thoracic and lumbar spine, were obtained without and with intravenous contrast. CONTRAST:  37mL GADAVIST GADOBUTROL 1 MMOL/ML IV SOLN COMPARISON:  None. FINDINGS: MRI CERVICAL SPINE FINDINGS Alignment: Straightening of the cervical curvature. Vertebrae: No fracture, evidence of discitis, or bone lesion. Cord: Mass effect on the cord with flattening at the C4-5 level. No cord signal abnormality. Posterior Fossa, vertebral arteries, paraspinal tissues: Negative. Disc levels: C2-3: Small posterior disc protrusion resulting mild spinal canal stenosis. Facet degenerative changes resulting in moderate right neural foraminal narrowing. C3-4: Posterior disc protrusion without significant spinal canal stenosis. Uncovertebral and facet degenerative changes resulting in severe left neural foraminal narrowing. C4-5: Posterior disc protrusion resulting in moderate to severe spinal canal stenosis with mass effect on the cord without cord signal abnormality. Uncovertebral and facet degenerative changes resulting in moderate right and severe left neural foraminal narrowing. C5-6: Posterior disc protrusion resulting in mild spinal canal stenosis. Uncovertebral and facet degenerative changes resulting in severe right and mild left neural foraminal narrowing. C6-7: Posterior disc protrusion resulting in moderate spinal canal stenosis. Uncovertebral and facet degenerative changes resulting in severe bilateral neural foraminal narrowing. C7-T1: No spinal canal or neural foraminal stenosis. MRI THORACIC SPINE FINDINGS Alignment:  Physiologic. Vertebrae: No fracture, evidence of discitis, or bone lesion. Cord:  Normal signal and morphology. Paraspinal and other soft tissues: Small bilateral pleural effusion. Disc levels: At T9-10, a left posterolateral disc protrusion, facet degenerative changes with ligamentum flavum  redundancy causes indentations of the thecal sac without significant spinal canal or neural foraminal stenosis. No significant disc herniation, spinal canal or neural foraminal stenosis in the other thoracic levels. MRI LUMBAR SPINE FINDINGS Segmentation: A transitional lumbosacral vertebra is assumed to represent a partially sacralized L5 level with rudimentary disc at L5-S1. Careful correlation with this numbering strategy prior to any procedural intervention would be recommended. Alignment:  Minimal retrolisthesis of L1 over L2 and L3 over L4. Vertebrae: No fracture, evidence of discitis, or bone lesion. Loss  of disc height with prominent endplate degenerative changes at L1-2 and L4-5. Conus medullaris and cauda equina: Conus extends to the L1 level. Conus and cauda equina appear normal. Paraspinal and other soft tissues: Negative. Disc levels: T12-L1: No spinal canal or neural foraminal stenosis. L1-2: Loss of disc height, disc bulge, moderate facet degenerative change ligamentum flavum redundancy resulting in mild to moderate spinal canal stenosis with narrowing of the bilateral subarticular zones and mild bilateral neural foraminal narrowing. L2-3: Shallow disc bulge and moderate facet degenerative changes resulting in mild spinal canal stenosis and mild right neural foraminal narrowing. L3-4: Shallow disc bulge with superimposed small central disc protrusion, moderate facet degenerative change ligamentum flavum redundancy resulting mild spinal canal stenosis with narrowing of the bilateral subarticular zones and mild bilateral neural foraminal narrowing. L4-5: Loss of disc height, disc bulge with associated osteophytic component and moderate facet degenerative changes resulting mild narrowing of the bilateral subarticular zones and moderate bilateral neural foraminal narrowing. L5-S1: No spinal canal or neural foraminal stenosis. IMPRESSION: 1. No evidence of discitis-osteomyelitis. 2. Multilevel degenerative  changes of the cervical spine with moderate to severe spinal canal stenosis at C4-5 with mass effect on the cord without cord signal abnormality. Moderate spinal canal stenosis at C6-7. 3. No significant spinal canal or neural foraminal stenosis at the thoracic levels. 4. Transitional lumbosacral vertebra is assumed to represent a partially sacralized L5 level with rudimentary disc at L5-S1. Careful correlation with this numbering strategy prior to any procedural intervention would be recommended. 5. Multilevel degenerative changes of the lumbar spine with mild-to-moderate spinal canal stenosis at L1-2 and mild spinal canal stenosis at L2-3 and L3-4. There is narrowing of the bilateral subarticular zones at L1-2 and L4-5 with mild bilateral neural foraminal narrowing. 6. Small bilateral pleural effusion. Electronically Signed   By: Pedro Earls M.D.   On: 11/18/2020 16:11   CT ABDOMEN PELVIS W CONTRAST  Result Date: 11/18/2020 CLINICAL DATA:  Abdominal abscess. EXAM: CT ABDOMEN AND PELVIS WITH CONTRAST TECHNIQUE: Multidetector CT imaging of the abdomen and pelvis was performed using the standard protocol following bolus administration of intravenous contrast. CONTRAST:  137mL OMNIPAQUE IOHEXOL 300 MG/ML  SOLN COMPARISON:  December 30, 2006. FINDINGS: Lower chest: Small bilateral pleural effusions are noted with adjacent subsegmental atelectasis. Hepatobiliary: No focal liver abnormality is seen. Status post cholecystectomy. No biliary dilatation. Pancreas: Unremarkable. No pancreatic ductal dilatation or surrounding inflammatory changes. Spleen: Normal in size without focal abnormality. Adrenals/Urinary Tract: Adrenal glands are unremarkable. Kidneys are normal, without renal calculi, focal lesion, or hydronephrosis. Mild urinary bladder distention is noted. Stomach/Bowel: Moderate gastric distention is noted. Stool is noted throughout the colon. No large or small bowel dilatation is noted. No  definite inflammation is noted. The appendix is unremarkable. Vascular/Lymphatic: Aortic atherosclerosis. Bilateral inguinal adenopathy is noted which may be inflammatory or reactive in etiology. Reproductive: Prostate is unremarkable. Other: Scrotal hydrocele is noted. Musculoskeletal: Degenerative disc disease is noted at L2-3 and L5-S1. No acute abnormality is noted. IMPRESSION: 1. Moderate gastric distention is noted. Stool is noted throughout the colon. 2. Small bilateral pleural effusions are noted with adjacent subsegmental atelectasis. 3. Aortic atherosclerosis. 4. Bilateral inguinal adenopathy is noted which may be inflammatory or reactive in etiology. 5. Mild urinary bladder distention is noted. 6. Scrotal hydrocele is noted. Aortic Atherosclerosis (ICD10-I70.0). Electronically Signed   By: Marijo Conception M.D.   On: 11/18/2020 15:28     Assessment/Plan: Enterococcus bacteremia Staff aureus bacteremia Left heel wound has  Enterococcus and staph aureus.  So likely source for the bacteremia is from the heel wound.  Even though the MRI and x-ray of the left foot does not show osteomyelitis it is very well during why he would have persistent bacteremia.  Persistent Enterococcus bacteremia after 1 neg culture on 11/14/2020. This could be uncontrolled infection or switching antibiotics when Vanco was switched to daptomycin on 10/13/2020. So restarted  vancomycin yesterday.  Repeat blood culture has been sent today.  If Vanco trough is therapeutic we will stop daptomycin. Also patient has been pan imaged and no infectious findings on the MRI of the cervical thoracic lumbar spine as well as CT abdomen. TEE negative for endocarditis Because of  bacteremia patient is going to need treatment for at least 6 weeks with antibiotics.  Diabetes mellitus on insulin  Peripheral artery disease underwent percutaneous transluminal angioplasty of the left peroneal artery.  Discussed the management with the  patient and the care team. Discussed with podiatrist- they will assess the wound again tomorrow

## 2020-11-19 NOTE — Progress Notes (Signed)
[facility] NAME: [name]    MR#:  [id number]  DATE OF BIRTH:  07-10-1951  SUBJECTIVE:  patient overall clinically improving. He ambulate using walker and hard sole boot in the room. Denies any new complaints. REVIEW OF SYSTEMS:   Review of Systems  Constitutional: Negative for chills, fever and weight loss.  HENT: Negative for ear discharge, ear pain and nosebleeds.   Eyes: Negative for blurred vision, pain and discharge.  Respiratory: Negative for sputum production, shortness of breath, wheezing and stridor.   Cardiovascular: Negative for chest pain, palpitations, orthopnea and PND.  Gastrointestinal: Negative for abdominal pain, diarrhea, nausea and vomiting.  Genitourinary: Negative for frequency and urgency.  Musculoskeletal: Negative for back pain and joint pain.  Neurological: Positive for weakness. Negative for sensory change, speech change and focal weakness.  Psychiatric/Behavioral: Negative for depression and hallucinations. The patient is not nervous/anxious.    Tolerating Diet:yesTolerating PT: HHPT  DRUG ALLERGIES:   Allergies  Allergen Reactions  . Penicillins Rash and Other (See Comments)    Rash in and around the mouth Has patient had a PCN reaction causing immediate rash, facial/tongue/throat swelling, SOB or lightheadedness with hypotension: Yes Has patient had a PCN reaction causing severe rash involving mucus membranes or skin necrosis: Yes Has patient had a PCN reaction that required hospitalization: No Has patient had a PCN reaction occurring within the last 10 years: Yes If all of the above answers are "NO", then may proceed with Cephalosporin use.   . Bactrim [Sulfamethoxazole-Trimethoprim] Other (See Comments)    Mouth sores, Sores develop in mouth    VITALS:  Blood pressure 113/72, pulse 90, temperature (!) 97.5 F (36.4 C), resp. rate 18, height 5\' 10"  (1.778 m), weight 82 kg, SpO2 93  %.  PHYSICAL EXAMINATION:   Physical Exam  GENERAL:  70 y.o.-year-old patient lying in the bed with no acute distress.  LUNGS: Normal breath sounds bilaterally, no wheezing, rales, rhonchi. No use of accessory muscles of respiration.  CARDIOVASCULAR: S1, S2 normal. No murmurs, rubs, or gallops.  ABDOMEN: Soft, nontender, nondistended. Bowel sounds present. No organomegaly or mass.  EXTREMITIES:    NEUROLOGIC: Cranial nerves II through XII are intact. No focal Motor or sensory deficits b/l.   PSYCHIATRIC:  patient is alert and oriented x 3.  SKIN: as above  LABORATORY PANEL:  CBC Recent Labs  Lab 11/19/20 0424  WBC 4.9  HGB 11.2*  HCT 34.2*  PLT 176    Chemistries  Recent Labs  Lab 11/19/20 0424  NA 133*  K 4.5  CL 99  CO2 27  GLUCOSE 213*  BUN 13  CREATININE 0.99  CALCIUM 8.7*  MG 2.1  AST 19  ALT 18  ALKPHOS 51  BILITOT 1.2   Cardiac Enzymes No results for input(s): TROPONINI in the last 168 hours. RADIOLOGY:  MR CERVICAL SPINE W WO CONTRAST  Result Date: 11/18/2020 CLINICAL DATA:  Cervical radiculopathy, infection suspected. Epidural abscess. Lumbar radiculopathy, cancer or infection suspected. EXAM: MRI CERVICAL, THORACIC AND LUMBAR SPINE WITHOUT AND WITH CONTRAST TECHNIQUE: Multiplanar and multiecho pulse sequences of the cervical spine, to include the craniocervical junction and cervicothoracic junction, and thoracic and lumbar spine, were obtained without and with intravenous contrast. CONTRAST:  51mL GADAVIST GADOBUTROL 1 MMOL/ML IV SOLN COMPARISON:  None. FINDINGS: MRI CERVICAL SPINE FINDINGS Alignment: Straightening of the cervical curvature. Vertebrae: No fracture, evidence of discitis, or bone lesion. Cord: Mass effect on the cord  with flattening at the C4-5 level. No cord signal abnormality. Posterior Fossa, vertebral arteries, paraspinal tissues: Negative. Disc levels: C2-3: Small posterior disc protrusion resulting mild spinal canal stenosis. Facet  degenerative changes resulting in moderate right neural foraminal narrowing. C3-4: Posterior disc protrusion without significant spinal canal stenosis. Uncovertebral and facet degenerative changes resulting in severe left neural foraminal narrowing. C4-5: Posterior disc protrusion resulting in moderate to severe spinal canal stenosis with mass effect on the cord without cord signal abnormality. Uncovertebral and facet degenerative changes resulting in moderate right and severe left neural foraminal narrowing. C5-6: Posterior disc protrusion resulting in mild spinal canal stenosis. Uncovertebral and facet degenerative changes resulting in severe right and mild left neural foraminal narrowing. C6-7: Posterior disc protrusion resulting in moderate spinal canal stenosis. Uncovertebral and facet degenerative changes resulting in severe bilateral neural foraminal narrowing. C7-T1: No spinal canal or neural foraminal stenosis. MRI THORACIC SPINE FINDINGS Alignment:  Physiologic. Vertebrae: No fracture, evidence of discitis, or bone lesion. Cord:  Normal signal and morphology. Paraspinal and other soft tissues: Small bilateral pleural effusion. Disc levels: At T9-10, a left posterolateral disc protrusion, facet degenerative changes with ligamentum flavum redundancy causes indentations of the thecal sac without significant spinal canal or neural foraminal stenosis. No significant disc herniation, spinal canal or neural foraminal stenosis in the other thoracic levels. MRI LUMBAR SPINE FINDINGS Segmentation: A transitional lumbosacral vertebra is assumed to represent a partially sacralized L5 level with rudimentary disc at L5-S1. Careful correlation with this numbering strategy prior to any procedural intervention would be recommended. Alignment:  Minimal retrolisthesis of L1 over L2 and L3 over L4. Vertebrae: No fracture, evidence of discitis, or bone lesion. Loss of disc height with prominent endplate degenerative changes at  L1-2 and L4-5. Conus medullaris and cauda equina: Conus extends to the L1 level. Conus and cauda equina appear normal. Paraspinal and other soft tissues: Negative. Disc levels: T12-L1: No spinal canal or neural foraminal stenosis. L1-2: Loss of disc height, disc bulge, moderate facet degenerative change ligamentum flavum redundancy resulting in mild to moderate spinal canal stenosis with narrowing of the bilateral subarticular zones and mild bilateral neural foraminal narrowing. L2-3: Shallow disc bulge and moderate facet degenerative changes resulting in mild spinal canal stenosis and mild right neural foraminal narrowing. L3-4: Shallow disc bulge with superimposed small central disc protrusion, moderate facet degenerative change ligamentum flavum redundancy resulting mild spinal canal stenosis with narrowing of the bilateral subarticular zones and mild bilateral neural foraminal narrowing. L4-5: Loss of disc height, disc bulge with associated osteophytic component and moderate facet degenerative changes resulting mild narrowing of the bilateral subarticular zones and moderate bilateral neural foraminal narrowing. L5-S1: No spinal canal or neural foraminal stenosis. IMPRESSION: 1. No evidence of discitis-osteomyelitis. 2. Multilevel degenerative changes of the cervical spine with moderate to severe spinal canal stenosis at C4-5 with mass effect on the cord without cord signal abnormality. Moderate spinal canal stenosis at C6-7. 3. No significant spinal canal or neural foraminal stenosis at the thoracic levels. 4. Transitional lumbosacral vertebra is assumed to represent a partially sacralized L5 level with rudimentary disc at L5-S1. Careful correlation with this numbering strategy prior to any procedural intervention would be recommended. 5. Multilevel degenerative changes of the lumbar spine with mild-to-moderate spinal canal stenosis at L1-2 and mild spinal canal stenosis at L2-3 and L3-4. There is narrowing of  the bilateral subarticular zones at L1-2 and L4-5 with mild bilateral neural foraminal narrowing. 6. Small bilateral pleural effusion. Electronically Signed   By:  Pedro Earls M.D.   On: 11/18/2020 16:11   MR THORACIC SPINE W WO CONTRAST  Result Date: 11/18/2020 CLINICAL DATA:  Cervical radiculopathy, infection suspected. Epidural abscess. Lumbar radiculopathy, cancer or infection suspected. EXAM: MRI CERVICAL, THORACIC AND LUMBAR SPINE WITHOUT AND WITH CONTRAST TECHNIQUE: Multiplanar and multiecho pulse sequences of the cervical spine, to include the craniocervical junction and cervicothoracic junction, and thoracic and lumbar spine, were obtained without and with intravenous contrast. CONTRAST:  51mL GADAVIST GADOBUTROL 1 MMOL/ML IV SOLN COMPARISON:  None. FINDINGS: MRI CERVICAL SPINE FINDINGS Alignment: Straightening of the cervical curvature. Vertebrae: No fracture, evidence of discitis, or bone lesion. Cord: Mass effect on the cord with flattening at the C4-5 level. No cord signal abnormality. Posterior Fossa, vertebral arteries, paraspinal tissues: Negative. Disc levels: C2-3: Small posterior disc protrusion resulting mild spinal canal stenosis. Facet degenerative changes resulting in moderate right neural foraminal narrowing. C3-4: Posterior disc protrusion without significant spinal canal stenosis. Uncovertebral and facet degenerative changes resulting in severe left neural foraminal narrowing. C4-5: Posterior disc protrusion resulting in moderate to severe spinal canal stenosis with mass effect on the cord without cord signal abnormality. Uncovertebral and facet degenerative changes resulting in moderate right and severe left neural foraminal narrowing. C5-6: Posterior disc protrusion resulting in mild spinal canal stenosis. Uncovertebral and facet degenerative changes resulting in severe right and mild left neural foraminal narrowing. C6-7: Posterior disc protrusion resulting in  moderate spinal canal stenosis. Uncovertebral and facet degenerative changes resulting in severe bilateral neural foraminal narrowing. C7-T1: No spinal canal or neural foraminal stenosis. MRI THORACIC SPINE FINDINGS Alignment:  Physiologic. Vertebrae: No fracture, evidence of discitis, or bone lesion. Cord:  Normal signal and morphology. Paraspinal and other soft tissues: Small bilateral pleural effusion. Disc levels: At T9-10, a left posterolateral disc protrusion, facet degenerative changes with ligamentum flavum redundancy causes indentations of the thecal sac without significant spinal canal or neural foraminal stenosis. No significant disc herniation, spinal canal or neural foraminal stenosis in the other thoracic levels. MRI LUMBAR SPINE FINDINGS Segmentation: A transitional lumbosacral vertebra is assumed to represent a partially sacralized L5 level with rudimentary disc at L5-S1. Careful correlation with this numbering strategy prior to any procedural intervention would be recommended. Alignment:  Minimal retrolisthesis of L1 over L2 and L3 over L4. Vertebrae: No fracture, evidence of discitis, or bone lesion. Loss of disc height with prominent endplate degenerative changes at L1-2 and L4-5. Conus medullaris and cauda equina: Conus extends to the L1 level. Conus and cauda equina appear normal. Paraspinal and other soft tissues: Negative. Disc levels: T12-L1: No spinal canal or neural foraminal stenosis. L1-2: Loss of disc height, disc bulge, moderate facet degenerative change ligamentum flavum redundancy resulting in mild to moderate spinal canal stenosis with narrowing of the bilateral subarticular zones and mild bilateral neural foraminal narrowing. L2-3: Shallow disc bulge and moderate facet degenerative changes resulting in mild spinal canal stenosis and mild right neural foraminal narrowing. L3-4: Shallow disc bulge with superimposed small central disc protrusion, moderate facet degenerative change  ligamentum flavum redundancy resulting mild spinal canal stenosis with narrowing of the bilateral subarticular zones and mild bilateral neural foraminal narrowing. L4-5: Loss of disc height, disc bulge with associated osteophytic component and moderate facet degenerative changes resulting mild narrowing of the bilateral subarticular zones and moderate bilateral neural foraminal narrowing. L5-S1: No spinal canal or neural foraminal stenosis. IMPRESSION: 1. No evidence of discitis-osteomyelitis. 2. Multilevel degenerative changes of the cervical spine with moderate to severe spinal canal  stenosis at C4-5 with mass effect on the cord without cord signal abnormality. Moderate spinal canal stenosis at C6-7. 3. No significant spinal canal or neural foraminal stenosis at the thoracic levels. 4. Transitional lumbosacral vertebra is assumed to represent a partially sacralized L5 level with rudimentary disc at L5-S1. Careful correlation with this numbering strategy prior to any procedural intervention would be recommended. 5. Multilevel degenerative changes of the lumbar spine with mild-to-moderate spinal canal stenosis at L1-2 and mild spinal canal stenosis at L2-3 and L3-4. There is narrowing of the bilateral subarticular zones at L1-2 and L4-5 with mild bilateral neural foraminal narrowing. 6. Small bilateral pleural effusion. Electronically Signed   By: Pedro Earls M.D.   On: 11/18/2020 16:11   MR Lumbar Spine W Wo Contrast  Result Date: 11/18/2020 CLINICAL DATA:  Cervical radiculopathy, infection suspected. Epidural abscess. Lumbar radiculopathy, cancer or infection suspected. EXAM: MRI CERVICAL, THORACIC AND LUMBAR SPINE WITHOUT AND WITH CONTRAST TECHNIQUE: Multiplanar and multiecho pulse sequences of the cervical spine, to include the craniocervical junction and cervicothoracic junction, and thoracic and lumbar spine, were obtained without and with intravenous contrast. CONTRAST:  45mL GADAVIST  GADOBUTROL 1 MMOL/ML IV SOLN COMPARISON:  None. FINDINGS: MRI CERVICAL SPINE FINDINGS Alignment: Straightening of the cervical curvature. Vertebrae: No fracture, evidence of discitis, or bone lesion. Cord: Mass effect on the cord with flattening at the C4-5 level. No cord signal abnormality. Posterior Fossa, vertebral arteries, paraspinal tissues: Negative. Disc levels: C2-3: Small posterior disc protrusion resulting mild spinal canal stenosis. Facet degenerative changes resulting in moderate right neural foraminal narrowing. C3-4: Posterior disc protrusion without significant spinal canal stenosis. Uncovertebral and facet degenerative changes resulting in severe left neural foraminal narrowing. C4-5: Posterior disc protrusion resulting in moderate to severe spinal canal stenosis with mass effect on the cord without cord signal abnormality. Uncovertebral and facet degenerative changes resulting in moderate right and severe left neural foraminal narrowing. C5-6: Posterior disc protrusion resulting in mild spinal canal stenosis. Uncovertebral and facet degenerative changes resulting in severe right and mild left neural foraminal narrowing. C6-7: Posterior disc protrusion resulting in moderate spinal canal stenosis. Uncovertebral and facet degenerative changes resulting in severe bilateral neural foraminal narrowing. C7-T1: No spinal canal or neural foraminal stenosis. MRI THORACIC SPINE FINDINGS Alignment:  Physiologic. Vertebrae: No fracture, evidence of discitis, or bone lesion. Cord:  Normal signal and morphology. Paraspinal and other soft tissues: Small bilateral pleural effusion. Disc levels: At T9-10, a left posterolateral disc protrusion, facet degenerative changes with ligamentum flavum redundancy causes indentations of the thecal sac without significant spinal canal or neural foraminal stenosis. No significant disc herniation, spinal canal or neural foraminal stenosis in the other thoracic levels. MRI LUMBAR  SPINE FINDINGS Segmentation: A transitional lumbosacral vertebra is assumed to represent a partially sacralized L5 level with rudimentary disc at L5-S1. Careful correlation with this numbering strategy prior to any procedural intervention would be recommended. Alignment:  Minimal retrolisthesis of L1 over L2 and L3 over L4. Vertebrae: No fracture, evidence of discitis, or bone lesion. Loss of disc height with prominent endplate degenerative changes at L1-2 and L4-5. Conus medullaris and cauda equina: Conus extends to the L1 level. Conus and cauda equina appear normal. Paraspinal and other soft tissues: Negative. Disc levels: T12-L1: No spinal canal or neural foraminal stenosis. L1-2: Loss of disc height, disc bulge, moderate facet degenerative change ligamentum flavum redundancy resulting in mild to moderate spinal canal stenosis with narrowing of the bilateral subarticular zones and mild bilateral neural  foraminal narrowing. L2-3: Shallow disc bulge and moderate facet degenerative changes resulting in mild spinal canal stenosis and mild right neural foraminal narrowing. L3-4: Shallow disc bulge with superimposed small central disc protrusion, moderate facet degenerative change ligamentum flavum redundancy resulting mild spinal canal stenosis with narrowing of the bilateral subarticular zones and mild bilateral neural foraminal narrowing. L4-5: Loss of disc height, disc bulge with associated osteophytic component and moderate facet degenerative changes resulting mild narrowing of the bilateral subarticular zones and moderate bilateral neural foraminal narrowing. L5-S1: No spinal canal or neural foraminal stenosis. IMPRESSION: 1. No evidence of discitis-osteomyelitis. 2. Multilevel degenerative changes of the cervical spine with moderate to severe spinal canal stenosis at C4-5 with mass effect on the cord without cord signal abnormality. Moderate spinal canal stenosis at C6-7. 3. No significant spinal canal or neural  foraminal stenosis at the thoracic levels. 4. Transitional lumbosacral vertebra is assumed to represent a partially sacralized L5 level with rudimentary disc at L5-S1. Careful correlation with this numbering strategy prior to any procedural intervention would be recommended. 5. Multilevel degenerative changes of the lumbar spine with mild-to-moderate spinal canal stenosis at L1-2 and mild spinal canal stenosis at L2-3 and L3-4. There is narrowing of the bilateral subarticular zones at L1-2 and L4-5 with mild bilateral neural foraminal narrowing. 6. Small bilateral pleural effusion. Electronically Signed   By: Pedro Earls M.D.   On: 11/18/2020 16:11   CT ABDOMEN PELVIS W CONTRAST  Result Date: 11/18/2020 CLINICAL DATA:  Abdominal abscess. EXAM: CT ABDOMEN AND PELVIS WITH CONTRAST TECHNIQUE: Multidetector CT imaging of the abdomen and pelvis was performed using the standard protocol following bolus administration of intravenous contrast. CONTRAST:  155mL OMNIPAQUE IOHEXOL 300 MG/ML  SOLN COMPARISON:  December 30, 2006. FINDINGS: Lower chest: Small bilateral pleural effusions are noted with adjacent subsegmental atelectasis. Hepatobiliary: No focal liver abnormality is seen. Status post cholecystectomy. No biliary dilatation. Pancreas: Unremarkable. No pancreatic ductal dilatation or surrounding inflammatory changes. Spleen: Normal in size without focal abnormality. Adrenals/Urinary Tract: Adrenal glands are unremarkable. Kidneys are normal, without renal calculi, focal lesion, or hydronephrosis. Mild urinary bladder distention is noted. Stomach/Bowel: Moderate gastric distention is noted. Stool is noted throughout the colon. No large or small bowel dilatation is noted. No definite inflammation is noted. The appendix is unremarkable. Vascular/Lymphatic: Aortic atherosclerosis. Bilateral inguinal adenopathy is noted which may be inflammatory or reactive in etiology. Reproductive: Prostate is  unremarkable. Other: Scrotal hydrocele is noted. Musculoskeletal: Degenerative disc disease is noted at L2-3 and L5-S1. No acute abnormality is noted. IMPRESSION: 1. Moderate gastric distention is noted. Stool is noted throughout the colon. 2. Small bilateral pleural effusions are noted with adjacent subsegmental atelectasis. 3. Aortic atherosclerosis. 4. Bilateral inguinal adenopathy is noted which may be inflammatory or reactive in etiology. 5. Mild urinary bladder distention is noted. 6. Scrotal hydrocele is noted. Aortic Atherosclerosis (ICD10-I70.0). Electronically Signed   By: Marijo Conception M.D.   On: 11/18/2020 15:28   ASSESSMENT AND PLAN:  70 year old with history of essential hypertension, nonischemic cardiomyopathy, DM2, peripheral neuropathy, PAD, history of tobacco use quit 1998 presents with shortness of breath, poor oral intake nausea, diarrhea with left lower extremity swelling with worsening diabetic foot infection.    Patient has been fully vaccinated and boosted against COVID-19.  Sepsis secondary to diabetic foot infection of his left lower extremity with ulceration and cellulitis, nonpurulent MSSA/Enterococcus bacteremia, persistent bacteremia -MRI foot-showing cellulitis and myositis but no evidence of osteomyelitis -Antibiotics-IV daptomycin and vancomycin per  ID -Repeat cultures from 2/22 remain negative.  PICC line will be placed once is cleared -Podiatry-dressing changes, culture sent.  No surgical indication at this time.  Podiatry team aware of persistent bacteremia, they did not seem to think that is the source and repeat MRI foot will not change the plan.  -Vascular-s/p angiogram on 2/18 with PTA of left peroneal artery -Lower extremity Dopplers-negative for DVT -Echocardiogram-EF 60 to 65% -TEE 2/18-negative for endocarditis -MRI spine with contrast, CT abdomen pelvis with and without contrast -negative for discitis/osteomyelitis and abdominal pathology to explain  persistent bacterimia   Peripheral arterial disease - Aspirin Plavix and statin -Status post left lower extremity angio 2/21-status post balloon angioplasty left peroneal artery.  Chronic pain   -Pain control with bowel regimen  Shortness of breath with bilateral pleural effusion/atelectasis without hypoxia, improved -Out of bed to chair.  Incentive spirometer.  As needed bronchodilators liters -Echocardiogram -EF 60 to 65%, no evidence of endocarditis  Diabetes mellitus type 2, acceptable range -Peripheral neuropathy secondary to DM2 -Insulin sliding scale and Accu-Chek -Gabapentin 3 times daily  Hyperlipidemia Cont Statin   DVT prophylaxis: Lovenox Code Status: DNR Family Communication:    Status is: Inpatient  Remains inpatient appropriate because:Inpatient level of care appropriate due to severity of illness   Dispo: The patient is from: Home  Anticipated d/c is to: Home with HHPT  Anticipated d/c date is: 2 days if Podiatry does not plan further exploration  pt has  Persistent bacteremia, investigating source.  Once bacteremia has cleared, will need PICC line placement and home antibiotic arrangements.              Difficult to place patient No          TOTAL TIME TAKING CARE OF THIS PATIENT: 25 minutes.  >50% time spent on counselling and coordination of care  Note: This dictation was prepared with Dragon dictation along with smaller phrase technology. Any transcriptional errors that result from this process are unintentional.  Fritzi Mandes M.D    [facility]   CC: Primary care physician; [name], MDPatient ID: [name], male   DOB: [birth year], 70 y.o.   MRN: [id number]

## 2020-11-19 NOTE — TOC Progression Note (Signed)
Transition of Care [facility]) - Progression Note    Patient Details  Name: [name] MRN: [id number] Date of Birth: 07/03/1951  [facility] CM/SW Contact  Beverly Sessions, RN Phone Number: 11/19/2020, 12:00 PM  Clinical Narrative:    Per rounds plan to switch to IV vanc for discharge.  Pam with [facility] updated    Expected Discharge Plan: [place] Barriers to Discharge: Continued Medical Work up  Expected Discharge Plan and Services Expected Discharge Plan: [place]   Discharge Planning Services: CM Consult Post Acute Care Choice: [place] arrangements for the past 2 months: Single Family Home                           HH Arranged: RN,PT,OT,Nurse's Aide [place]: [facility] Date [place]: 11/17/20       Social Determinants of Health (SDOH) Interventions    Readmission Risk Interventions No flowsheet data found.

## 2020-11-20 DIAGNOSIS — B9561 Methicillin susceptible Staphylococcus aureus infection as the cause of diseases classified elsewhere: Secondary | ICD-10-CM | POA: Diagnosis not present

## 2020-11-20 DIAGNOSIS — L089 Local infection of the skin and subcutaneous tissue, unspecified: Secondary | ICD-10-CM | POA: Diagnosis not present

## 2020-11-20 DIAGNOSIS — I10 Essential (primary) hypertension: Secondary | ICD-10-CM | POA: Diagnosis not present

## 2020-11-20 DIAGNOSIS — E11621 Type 2 diabetes mellitus with foot ulcer: Secondary | ICD-10-CM | POA: Diagnosis not present

## 2020-11-20 DIAGNOSIS — R7881 Bacteremia: Secondary | ICD-10-CM | POA: Diagnosis not present

## 2020-11-20 DIAGNOSIS — B952 Enterococcus as the cause of diseases classified elsewhere: Secondary | ICD-10-CM | POA: Diagnosis not present

## 2020-11-20 DIAGNOSIS — E11628 Type 2 diabetes mellitus with other skin complications: Secondary | ICD-10-CM | POA: Diagnosis not present

## 2020-11-20 LAB — MAGNESIUM: Magnesium: 2.1 mg/dL (ref 1.7–2.4)

## 2020-11-20 LAB — SURGICAL PCR SCREEN
MRSA, PCR: NEGATIVE
Staphylococcus aureus: POSITIVE — AB

## 2020-11-20 LAB — CREATININE, SERUM
Creatinine, Ser: 1.01 mg/dL (ref 0.61–1.24)
GFR, Estimated: >60 mL/min (ref >60)

## 2020-11-20 LAB — GLUCOSE, CAPILLARY
Glucose-Capillary: 196 mg/dL — ABNORMAL HIGH (ref 70–99)
Glucose-Capillary: 202 mg/dL — ABNORMAL HIGH (ref 70–99)
Glucose-Capillary: 212 mg/dL — ABNORMAL HIGH (ref 70–99)
Glucose-Capillary: 182 mg/dL — ABNORMAL HIGH (ref 70–99)

## 2020-11-20 MED ORDER — MUPIROCIN 2 % EX OINT
1.0000 "application " | TOPICAL_OINTMENT | Freq: Two times a day (BID) | CUTANEOUS | Status: DC
  Administered 2020-11-20 – 2020-11-24 (×8): 1 via NASAL
  Filled 2020-11-20: qty 22

## 2020-11-20 MED ORDER — CHLORHEXIDINE GLUCONATE CLOTH 2 % EX PADS
6.0000 | MEDICATED_PAD | Freq: Every day | CUTANEOUS | Status: DC
  Administered 2020-11-21 – 2020-11-24 (×2): 6 via TOPICAL

## 2020-11-20 NOTE — Care Management Important Message (Signed)
Important Message  Patient Details  Name: [name] MRN: [id number] Date of Birth: 26-Apr-1951   Medicare Important Message Given:  Yes     Dannette Barbara 11/20/2020, 1:20 PM

## 2020-11-20 NOTE — Progress Notes (Signed)
ID Pt doing okay No specific complaints Patient Vitals for the past 24 hrs:  BP Temp Temp src Pulse Resp SpO2  11/20/20 1948 124/79 97.6 F (36.4 C) Oral 99 18 95 %  11/20/20 1525 124/74 97.9 F (36.6 C) Oral 92 18 99 %  11/20/20 1200 116/75 97.8 F (36.6 C) Oral 95 18 (!) 88 %  11/20/20 0729 102/72 (!) 97.2 F (36.2 C) Oral (!) 101 18 97 %  11/20/20 0414 111/68 97.7 F (36.5 C) -- 99 20 94 %  11/20/20 0039 123/75 98.4 F (36.9 C) -- 100 18 97 %   Awake and alert Chest b/l air entry [id number] abd soft CNS non focal Left foot wound     Labs CBC Latest Ref Rng & Units 11/19/2020 11/18/2020 11/17/2020  WBC 4.0 - 10.5 K/uL 4.9 5.4 4.8  Hemoglobin 13.0 - 17.0 g/dL 11.2(L) 10.1(L) 10.3(L)  Hematocrit 39.0 - 52.0 % 34.2(L) 31.0(L) 31.6(L)  Platelets 150 - 400 K/uL 176 181 179   CMP Latest Ref Rng & Units 11/20/2020 11/19/2020 11/18/2020  Glucose 70 - 99 mg/dL - 213(H) 126(H)  BUN 8 - 23 mg/dL - 13 10  Creatinine 0.61 - 1.24 mg/dL 1.01 0.99 0.86  Sodium 135 - 145 mmol/L - 133(L) 131(L)  Potassium 3.5 - 5.1 mmol/L - 4.5 4.1  Chloride 98 - 111 mmol/L - 99 99  CO2 22 - 32 mmol/L - 27 24  Calcium 8.9 - 10.3 mg/dL - 8.7(L) 8.2(L)  Total Protein 6.5 - 8.1 g/dL - 7.0 6.4(L)  Total Bilirubin 0.3 - 1.2 mg/dL - 1.2 1.0  Alkaline Phos 38 - 126 U/L - 51 42  AST 15 - 41 U/L - 19 21  ALT 0 - 44 U/L - 18 18     Microbiology: 11/11/2020 4 out of 4 blood culture positive for Enterococcus faecalis and staph aureus 11/12/2020 1 out of 4 bottle Enterococcus faecalis 11/12/2020 wound culture has Enterococcus and staph aureus and Prevotella brevia.  11/14/2020 blood culture no growth   11/15/2020 blood culture 3 out of 4 bottles Enterococcus faecalis  11/16/2020 4 out of 4 bottles Enterococcus faecalis. 11/18/2020 NG  Impression/recommendation Enterococcus bacteremia Staph aureus bacteremia Left heel wound has Enterococcus and Staph aureus.  So that is the source for the bacteremia  Even  though the MRI and x-ray of the left foot does not show any cells to me it is very likely possible because he has been having persistent bacteremia  Persistent Enterococcus bacteremia.  Finally the last blood culture has been negative from 11/18/2020.  He has been restarted on vancomycin and daptomycin has been stopped because of concern for catheter failure Patient was also pan imaged and there was no infectious findings of the spine as well as CT abdomen.  TEE was negative for endocarditis.  Because of persistent bacteremia he is going to the OR tomorrow for debridement of the heel wound with culture and bone biopsy.. Continue vancomycin  Diabetes mellitus on insulin  Peripheral artery disease.  Underwent percutaneous transluminal angioplasty of the left peroneal artery  Discussed the management with the care team.

## 2020-11-20 NOTE — Progress Notes (Signed)
[facility] NAME: [name]    MR#:  [id number]  DATE OF BIRTH:  1951-07-08  SUBJECTIVE:  patient overall clinically improving. He ambulate using walker with using wedge boot in the room. Denies any new complaints. REVIEW OF SYSTEMS:   Review of Systems  Constitutional: Negative for chills, fever and weight loss.  HENT: Negative for ear discharge, ear pain and nosebleeds.   Eyes: Negative for blurred vision, pain and discharge.  Respiratory: Negative for sputum production, shortness of breath, wheezing and stridor.   Cardiovascular: Negative for chest pain, palpitations, orthopnea and PND.  Gastrointestinal: Negative for abdominal pain, diarrhea, nausea and vomiting.  Genitourinary: Negative for frequency and urgency.  Musculoskeletal: Negative for back pain and joint pain.  Neurological: Positive for weakness. Negative for sensory change, speech change and focal weakness.  Psychiatric/Behavioral: Negative for depression and hallucinations. The patient is not nervous/anxious.    Tolerating Diet:yesTolerating PT: HHPT  DRUG ALLERGIES:   Allergies  Allergen Reactions  . Penicillins Rash and Other (See Comments)    Rash in and around the mouth Has patient had a PCN reaction causing immediate rash, facial/tongue/throat swelling, SOB or lightheadedness with hypotension: Yes Has patient had a PCN reaction causing severe rash involving mucus membranes or skin necrosis: Yes Has patient had a PCN reaction that required hospitalization: No Has patient had a PCN reaction occurring within the last 10 years: Yes If all of the above answers are "NO", then may proceed with Cephalosporin use.   . Bactrim [Sulfamethoxazole-Trimethoprim] Other (See Comments)    Mouth sores, Sores develop in mouth    VITALS:  Blood pressure 102/72, pulse (!) 101, temperature (!) 97.2 F (36.2 C), temperature source Oral, resp. rate 18, height 5\' 10"   (1.778 m), weight 82 kg, SpO2 97 %.  PHYSICAL EXAMINATION:   Physical Exam  GENERAL:  70 y.o.-year-old patient lying in the bed with no acute distress.  LUNGS: Normal breath sounds bilaterally, no wheezing, rales, rhonchi. No use of accessory muscles of respiration.  CARDIOVASCULAR: S1, S2 normal. No murmurs, rubs, or gallops.  ABDOMEN: Soft, nontender, nondistended. Bowel sounds present. No organomegaly or mass.  EXTREMITIES:    NEUROLOGIC: Cranial nerves II through XII are intact. No focal Motor or sensory deficits b/l.   PSYCHIATRIC:  patient is alert and oriented x 3.  SKIN: as above  LABORATORY PANEL:  CBC Recent Labs  Lab 11/19/20 0424  WBC 4.9  HGB 11.2*  HCT 34.2*  PLT 176    Chemistries  Recent Labs  Lab 11/19/20 0424 11/20/20 0407  NA 133*  --   K 4.5  --   CL 99  --   CO2 27  --   GLUCOSE 213*  --   BUN 13  --   CREATININE 0.99 1.01  CALCIUM 8.7*  --   MG 2.1 2.1  AST 19  --   ALT 18  --   ALKPHOS 51  --   BILITOT 1.2  --    Cardiac Enzymes No results for input(s): TROPONINI in the last 168 hours. RADIOLOGY:  MR CERVICAL SPINE W WO CONTRAST  Result Date: 11/18/2020 CLINICAL DATA:  Cervical radiculopathy, infection suspected. Epidural abscess. Lumbar radiculopathy, cancer or infection suspected. EXAM: MRI CERVICAL, THORACIC AND LUMBAR SPINE WITHOUT AND WITH CONTRAST TECHNIQUE: Multiplanar and multiecho pulse sequences of the cervical spine, to include the craniocervical junction and cervicothoracic junction, and thoracic and lumbar spine, were obtained without and  with intravenous contrast. CONTRAST:  87mL GADAVIST GADOBUTROL 1 MMOL/ML IV SOLN COMPARISON:  None. FINDINGS: MRI CERVICAL SPINE FINDINGS Alignment: Straightening of the cervical curvature. Vertebrae: No fracture, evidence of discitis, or bone lesion. Cord: Mass effect on the cord with flattening at the C4-5 level. No cord signal abnormality. Posterior Fossa, vertebral arteries, paraspinal  tissues: Negative. Disc levels: C2-3: Small posterior disc protrusion resulting mild spinal canal stenosis. Facet degenerative changes resulting in moderate right neural foraminal narrowing. C3-4: Posterior disc protrusion without significant spinal canal stenosis. Uncovertebral and facet degenerative changes resulting in severe left neural foraminal narrowing. C4-5: Posterior disc protrusion resulting in moderate to severe spinal canal stenosis with mass effect on the cord without cord signal abnormality. Uncovertebral and facet degenerative changes resulting in moderate right and severe left neural foraminal narrowing. C5-6: Posterior disc protrusion resulting in mild spinal canal stenosis. Uncovertebral and facet degenerative changes resulting in severe right and mild left neural foraminal narrowing. C6-7: Posterior disc protrusion resulting in moderate spinal canal stenosis. Uncovertebral and facet degenerative changes resulting in severe bilateral neural foraminal narrowing. C7-T1: No spinal canal or neural foraminal stenosis. MRI THORACIC SPINE FINDINGS Alignment:  Physiologic. Vertebrae: No fracture, evidence of discitis, or bone lesion. Cord:  Normal signal and morphology. Paraspinal and other soft tissues: Small bilateral pleural effusion. Disc levels: At T9-10, a left posterolateral disc protrusion, facet degenerative changes with ligamentum flavum redundancy causes indentations of the thecal sac without significant spinal canal or neural foraminal stenosis. No significant disc herniation, spinal canal or neural foraminal stenosis in the other thoracic levels. MRI LUMBAR SPINE FINDINGS Segmentation: A transitional lumbosacral vertebra is assumed to represent a partially sacralized L5 level with rudimentary disc at L5-S1. Careful correlation with this numbering strategy prior to any procedural intervention would be recommended. Alignment:  Minimal retrolisthesis of L1 over L2 and L3 over L4. Vertebrae: No  fracture, evidence of discitis, or bone lesion. Loss of disc height with prominent endplate degenerative changes at L1-2 and L4-5. Conus medullaris and cauda equina: Conus extends to the L1 level. Conus and cauda equina appear normal. Paraspinal and other soft tissues: Negative. Disc levels: T12-L1: No spinal canal or neural foraminal stenosis. L1-2: Loss of disc height, disc bulge, moderate facet degenerative change ligamentum flavum redundancy resulting in mild to moderate spinal canal stenosis with narrowing of the bilateral subarticular zones and mild bilateral neural foraminal narrowing. L2-3: Shallow disc bulge and moderate facet degenerative changes resulting in mild spinal canal stenosis and mild right neural foraminal narrowing. L3-4: Shallow disc bulge with superimposed small central disc protrusion, moderate facet degenerative change ligamentum flavum redundancy resulting mild spinal canal stenosis with narrowing of the bilateral subarticular zones and mild bilateral neural foraminal narrowing. L4-5: Loss of disc height, disc bulge with associated osteophytic component and moderate facet degenerative changes resulting mild narrowing of the bilateral subarticular zones and moderate bilateral neural foraminal narrowing. L5-S1: No spinal canal or neural foraminal stenosis. IMPRESSION: 1. No evidence of discitis-osteomyelitis. 2. Multilevel degenerative changes of the cervical spine with moderate to severe spinal canal stenosis at C4-5 with mass effect on the cord without cord signal abnormality. Moderate spinal canal stenosis at C6-7. 3. No significant spinal canal or neural foraminal stenosis at the thoracic levels. 4. Transitional lumbosacral vertebra is assumed to represent a partially sacralized L5 level with rudimentary disc at L5-S1. Careful correlation with this numbering strategy prior to any procedural intervention would be recommended. 5. Multilevel degenerative changes of the lumbar spine with  mild-to-moderate spinal  canal stenosis at L1-2 and mild spinal canal stenosis at L2-3 and L3-4. There is narrowing of the bilateral subarticular zones at L1-2 and L4-5 with mild bilateral neural foraminal narrowing. 6. Small bilateral pleural effusion. Electronically Signed   By: Pedro Earls M.D.   On: 11/18/2020 16:11   MR THORACIC SPINE W WO CONTRAST  Result Date: 11/18/2020 CLINICAL DATA:  Cervical radiculopathy, infection suspected. Epidural abscess. Lumbar radiculopathy, cancer or infection suspected. EXAM: MRI CERVICAL, THORACIC AND LUMBAR SPINE WITHOUT AND WITH CONTRAST TECHNIQUE: Multiplanar and multiecho pulse sequences of the cervical spine, to include the craniocervical junction and cervicothoracic junction, and thoracic and lumbar spine, were obtained without and with intravenous contrast. CONTRAST:  49mL GADAVIST GADOBUTROL 1 MMOL/ML IV SOLN COMPARISON:  None. FINDINGS: MRI CERVICAL SPINE FINDINGS Alignment: Straightening of the cervical curvature. Vertebrae: No fracture, evidence of discitis, or bone lesion. Cord: Mass effect on the cord with flattening at the C4-5 level. No cord signal abnormality. Posterior Fossa, vertebral arteries, paraspinal tissues: Negative. Disc levels: C2-3: Small posterior disc protrusion resulting mild spinal canal stenosis. Facet degenerative changes resulting in moderate right neural foraminal narrowing. C3-4: Posterior disc protrusion without significant spinal canal stenosis. Uncovertebral and facet degenerative changes resulting in severe left neural foraminal narrowing. C4-5: Posterior disc protrusion resulting in moderate to severe spinal canal stenosis with mass effect on the cord without cord signal abnormality. Uncovertebral and facet degenerative changes resulting in moderate right and severe left neural foraminal narrowing. C5-6: Posterior disc protrusion resulting in mild spinal canal stenosis. Uncovertebral and facet degenerative changes  resulting in severe right and mild left neural foraminal narrowing. C6-7: Posterior disc protrusion resulting in moderate spinal canal stenosis. Uncovertebral and facet degenerative changes resulting in severe bilateral neural foraminal narrowing. C7-T1: No spinal canal or neural foraminal stenosis. MRI THORACIC SPINE FINDINGS Alignment:  Physiologic. Vertebrae: No fracture, evidence of discitis, or bone lesion. Cord:  Normal signal and morphology. Paraspinal and other soft tissues: Small bilateral pleural effusion. Disc levels: At T9-10, a left posterolateral disc protrusion, facet degenerative changes with ligamentum flavum redundancy causes indentations of the thecal sac without significant spinal canal or neural foraminal stenosis. No significant disc herniation, spinal canal or neural foraminal stenosis in the other thoracic levels. MRI LUMBAR SPINE FINDINGS Segmentation: A transitional lumbosacral vertebra is assumed to represent a partially sacralized L5 level with rudimentary disc at L5-S1. Careful correlation with this numbering strategy prior to any procedural intervention would be recommended. Alignment:  Minimal retrolisthesis of L1 over L2 and L3 over L4. Vertebrae: No fracture, evidence of discitis, or bone lesion. Loss of disc height with prominent endplate degenerative changes at L1-2 and L4-5. Conus medullaris and cauda equina: Conus extends to the L1 level. Conus and cauda equina appear normal. Paraspinal and other soft tissues: Negative. Disc levels: T12-L1: No spinal canal or neural foraminal stenosis. L1-2: Loss of disc height, disc bulge, moderate facet degenerative change ligamentum flavum redundancy resulting in mild to moderate spinal canal stenosis with narrowing of the bilateral subarticular zones and mild bilateral neural foraminal narrowing. L2-3: Shallow disc bulge and moderate facet degenerative changes resulting in mild spinal canal stenosis and mild right neural foraminal narrowing.  L3-4: Shallow disc bulge with superimposed small central disc protrusion, moderate facet degenerative change ligamentum flavum redundancy resulting mild spinal canal stenosis with narrowing of the bilateral subarticular zones and mild bilateral neural foraminal narrowing. L4-5: Loss of disc height, disc bulge with associated osteophytic component and moderate facet degenerative changes resulting  mild narrowing of the bilateral subarticular zones and moderate bilateral neural foraminal narrowing. L5-S1: No spinal canal or neural foraminal stenosis. IMPRESSION: 1. No evidence of discitis-osteomyelitis. 2. Multilevel degenerative changes of the cervical spine with moderate to severe spinal canal stenosis at C4-5 with mass effect on the cord without cord signal abnormality. Moderate spinal canal stenosis at C6-7. 3. No significant spinal canal or neural foraminal stenosis at the thoracic levels. 4. Transitional lumbosacral vertebra is assumed to represent a partially sacralized L5 level with rudimentary disc at L5-S1. Careful correlation with this numbering strategy prior to any procedural intervention would be recommended. 5. Multilevel degenerative changes of the lumbar spine with mild-to-moderate spinal canal stenosis at L1-2 and mild spinal canal stenosis at L2-3 and L3-4. There is narrowing of the bilateral subarticular zones at L1-2 and L4-5 with mild bilateral neural foraminal narrowing. 6. Small bilateral pleural effusion. Electronically Signed   By: Pedro Earls M.D.   On: 11/18/2020 16:11   MR Lumbar Spine W Wo Contrast  Result Date: 11/18/2020 CLINICAL DATA:  Cervical radiculopathy, infection suspected. Epidural abscess. Lumbar radiculopathy, cancer or infection suspected. EXAM: MRI CERVICAL, THORACIC AND LUMBAR SPINE WITHOUT AND WITH CONTRAST TECHNIQUE: Multiplanar and multiecho pulse sequences of the cervical spine, to include the craniocervical junction and cervicothoracic junction,  and thoracic and lumbar spine, were obtained without and with intravenous contrast. CONTRAST:  35mL GADAVIST GADOBUTROL 1 MMOL/ML IV SOLN COMPARISON:  None. FINDINGS: MRI CERVICAL SPINE FINDINGS Alignment: Straightening of the cervical curvature. Vertebrae: No fracture, evidence of discitis, or bone lesion. Cord: Mass effect on the cord with flattening at the C4-5 level. No cord signal abnormality. Posterior Fossa, vertebral arteries, paraspinal tissues: Negative. Disc levels: C2-3: Small posterior disc protrusion resulting mild spinal canal stenosis. Facet degenerative changes resulting in moderate right neural foraminal narrowing. C3-4: Posterior disc protrusion without significant spinal canal stenosis. Uncovertebral and facet degenerative changes resulting in severe left neural foraminal narrowing. C4-5: Posterior disc protrusion resulting in moderate to severe spinal canal stenosis with mass effect on the cord without cord signal abnormality. Uncovertebral and facet degenerative changes resulting in moderate right and severe left neural foraminal narrowing. C5-6: Posterior disc protrusion resulting in mild spinal canal stenosis. Uncovertebral and facet degenerative changes resulting in severe right and mild left neural foraminal narrowing. C6-7: Posterior disc protrusion resulting in moderate spinal canal stenosis. Uncovertebral and facet degenerative changes resulting in severe bilateral neural foraminal narrowing. C7-T1: No spinal canal or neural foraminal stenosis. MRI THORACIC SPINE FINDINGS Alignment:  Physiologic. Vertebrae: No fracture, evidence of discitis, or bone lesion. Cord:  Normal signal and morphology. Paraspinal and other soft tissues: Small bilateral pleural effusion. Disc levels: At T9-10, a left posterolateral disc protrusion, facet degenerative changes with ligamentum flavum redundancy causes indentations of the thecal sac without significant spinal canal or neural foraminal stenosis. No  significant disc herniation, spinal canal or neural foraminal stenosis in the other thoracic levels. MRI LUMBAR SPINE FINDINGS Segmentation: A transitional lumbosacral vertebra is assumed to represent a partially sacralized L5 level with rudimentary disc at L5-S1. Careful correlation with this numbering strategy prior to any procedural intervention would be recommended. Alignment:  Minimal retrolisthesis of L1 over L2 and L3 over L4. Vertebrae: No fracture, evidence of discitis, or bone lesion. Loss of disc height with prominent endplate degenerative changes at L1-2 and L4-5. Conus medullaris and cauda equina: Conus extends to the L1 level. Conus and cauda equina appear normal. Paraspinal and other soft tissues: Negative. Disc levels:  T12-L1: No spinal canal or neural foraminal stenosis. L1-2: Loss of disc height, disc bulge, moderate facet degenerative change ligamentum flavum redundancy resulting in mild to moderate spinal canal stenosis with narrowing of the bilateral subarticular zones and mild bilateral neural foraminal narrowing. L2-3: Shallow disc bulge and moderate facet degenerative changes resulting in mild spinal canal stenosis and mild right neural foraminal narrowing. L3-4: Shallow disc bulge with superimposed small central disc protrusion, moderate facet degenerative change ligamentum flavum redundancy resulting mild spinal canal stenosis with narrowing of the bilateral subarticular zones and mild bilateral neural foraminal narrowing. L4-5: Loss of disc height, disc bulge with associated osteophytic component and moderate facet degenerative changes resulting mild narrowing of the bilateral subarticular zones and moderate bilateral neural foraminal narrowing. L5-S1: No spinal canal or neural foraminal stenosis. IMPRESSION: 1. No evidence of discitis-osteomyelitis. 2. Multilevel degenerative changes of the cervical spine with moderate to severe spinal canal stenosis at C4-5 with mass effect on the cord  without cord signal abnormality. Moderate spinal canal stenosis at C6-7. 3. No significant spinal canal or neural foraminal stenosis at the thoracic levels. 4. Transitional lumbosacral vertebra is assumed to represent a partially sacralized L5 level with rudimentary disc at L5-S1. Careful correlation with this numbering strategy prior to any procedural intervention would be recommended. 5. Multilevel degenerative changes of the lumbar spine with mild-to-moderate spinal canal stenosis at L1-2 and mild spinal canal stenosis at L2-3 and L3-4. There is narrowing of the bilateral subarticular zones at L1-2 and L4-5 with mild bilateral neural foraminal narrowing. 6. Small bilateral pleural effusion. Electronically Signed   By: Pedro Earls M.D.   On: 11/18/2020 16:11   CT ABDOMEN PELVIS W CONTRAST  Result Date: 11/18/2020 CLINICAL DATA:  Abdominal abscess. EXAM: CT ABDOMEN AND PELVIS WITH CONTRAST TECHNIQUE: Multidetector CT imaging of the abdomen and pelvis was performed using the standard protocol following bolus administration of intravenous contrast. CONTRAST:  16mL OMNIPAQUE IOHEXOL 300 MG/ML  SOLN COMPARISON:  December 30, 2006. FINDINGS: Lower chest: Small bilateral pleural effusions are noted with adjacent subsegmental atelectasis. Hepatobiliary: No focal liver abnormality is seen. Status post cholecystectomy. No biliary dilatation. Pancreas: Unremarkable. No pancreatic ductal dilatation or surrounding inflammatory changes. Spleen: Normal in size without focal abnormality. Adrenals/Urinary Tract: Adrenal glands are unremarkable. Kidneys are normal, without renal calculi, focal lesion, or hydronephrosis. Mild urinary bladder distention is noted. Stomach/Bowel: Moderate gastric distention is noted. Stool is noted throughout the colon. No large or small bowel dilatation is noted. No definite inflammation is noted. The appendix is unremarkable. Vascular/Lymphatic: Aortic atherosclerosis. Bilateral  inguinal adenopathy is noted which may be inflammatory or reactive in etiology. Reproductive: Prostate is unremarkable. Other: Scrotal hydrocele is noted. Musculoskeletal: Degenerative disc disease is noted at L2-3 and L5-S1. No acute abnormality is noted. IMPRESSION: 1. Moderate gastric distention is noted. Stool is noted throughout the colon. 2. Small bilateral pleural effusions are noted with adjacent subsegmental atelectasis. 3. Aortic atherosclerosis. 4. Bilateral inguinal adenopathy is noted which may be inflammatory or reactive in etiology. 5. Mild urinary bladder distention is noted. 6. Scrotal hydrocele is noted. Aortic Atherosclerosis (ICD10-I70.0). Electronically Signed   By: Marijo Conception M.D.   On: 11/18/2020 15:28   ASSESSMENT AND PLAN:  70 year old with history of essential hypertension, nonischemic cardiomyopathy, DM2, peripheral neuropathy, PAD, history of tobacco use quit 1998 presents with shortness of breath, poor oral intake nausea, diarrhea with left lower extremity swelling with worsening diabetic foot infection.    Patient has been fully  vaccinated and boosted against COVID-19.  Sepsis secondary to diabetic foot infection of his left lower extremity with ulceration and cellulitis, nonpurulent MSSA/Enterococcus bacteremia, persistent bacteremia -MRI foot-showing cellulitis and myositis but no evidence of osteomyelitis -Antibiotics-IV daptomycin and vancomycin per ID -Repeat cultures from 2/22 remains negative.  PICC line will be placed when advised by ID -Podiatry-dressing changes, culture sent.  No surgical indication at this time.  Podiatry team aware of persistent bacteremia, they did not seem to think that is the source and repeat MRI foot will not change the plan.  -Vascular-s/p angiogram on 2/18 with PTA of left peroneal artery -Lower extremity Dopplers-negative for DVT -Echocardiogram-EF 60 to 65% -TEE 2/18-negative for endocarditis -MRI spine with contrast, CT  abdomen pelvis with and without contrast negative for discitis/osteomyelitis and abdominal pathology to explain persistent bacterimia -- 2/24-- Dr Vickki Muff plans debridement with possible bone bx tomorrow   Peripheral arterial disease - Aspirin Plavix and statin -Status post left lower extremity angio 2/21-status post balloon angioplasty left peroneal artery.  Chronic pain   -Pain control with bowel regimen  Shortness of breath with bilateral pleural effusion/atelectasis without hypoxia, improved -Out of bed to chair.  Incentive spirometer.  As needed bronchodilators liters -Echocardiogram -EF 60 to 65%, no evidence of endocarditis  Diabetes mellitus type 2, acceptable range -Peripheral neuropathy secondary to DM2 -Insulin sliding scale and Accu-Chek -Gabapentin 3 times daily  Hyperlipidemia Cont Statin   DVT prophylaxis: Lovenox Code Status: DNR Family Communication:  Bernice--step dter on the phone  Status is: Inpatient  Remains inpatient appropriate because:Inpatient level of care appropriate due to severity of illness   Dispo: The patient is from: Home  Anticipated d/c is to: Home with HHPT  Anticipated d/c date is: 2-3  days since Podiatry is  planning further exploration               Difficult to place patient No          TOTAL TIME TAKING CARE OF THIS PATIENT: 25 minutes.  >50% time spent on counselling and coordination of care  Note: This dictation was prepared with Dragon dictation along with smaller phrase technology. Any transcriptional errors that result from this process are unintentional.  Fritzi Mandes M.D    [facility]   CC: Primary care physician; [name], MDPatient ID: Tillie Rung, male   DOB: 10-Apr-1951, 70 y.o.   MRN: [id number]

## 2020-11-20 NOTE — Progress Notes (Signed)
Daily Progress Note   Subjective  - 3 Days Post-Op  Follow-up left heel ulceration.  Patient has had multiple positive blood cultures.  Concern for left heel as the nidus of infection.  Objective Vitals:   11/19/20 1957 11/20/20 0039 11/20/20 0414 11/20/20 0729  BP: 128/84 123/75 111/68 102/72  Pulse: 95 100 99 (!) 101  Resp: 20 18 20 18   Temp: 98.3 F (36.8 C) 98.4 F (36.9 C) 97.7 F (36.5 C) (!) 97.2 F (36.2 C)  TempSrc:    Oral  SpO2: 99% 97% 94% 97%  Weight:      Height:        Physical Exam: Area of necrotic tissue on the plantar aspect of the heel is stable.  No purulent drainage.  Per the patient this did come about quite quickly.  Laboratory CBC    Component Value Date/Time   WBC 4.9 11/19/2020 0424   HGB 11.2 (L) 11/19/2020 0424   HGB 15.2 04/17/2014 1026   HCT 34.2 (L) 11/19/2020 0424   HCT 44.6 04/17/2014 1026   PLT 176 11/19/2020 0424   PLT 127 (L) 04/17/2014 1026    BMET    Component Value Date/Time   NA 133 (L) 11/19/2020 0424   NA 136 04/17/2014 1026   K 4.5 11/19/2020 0424   K 5.1 04/17/2014 1026   CL 99 11/19/2020 0424   CL 103 04/17/2014 1026   CO2 27 11/19/2020 0424   CO2 28 04/17/2014 1026   GLUCOSE 213 (H) 11/19/2020 0424   GLUCOSE 124 (H) 04/17/2014 1026   BUN 13 11/19/2020 0424   BUN 28 (H) 04/17/2014 1026   CREATININE 1.01 11/20/2020 0407   CREATININE 1.30 04/17/2014 1026   CALCIUM 8.7 (L) 11/19/2020 0424   CALCIUM 8.6 04/17/2014 1026   GFRNONAA >60 11/20/2020 0407   GFRNONAA 59 (L) 04/17/2014 1026   GFRAA >60 06/20/2020 1001   GFRAA >60 04/17/2014 1026    Assessment/Planning: Positive bacteremia with Enterococcus Left heel ulceration with necrosis Referral vascular disease   Patient has had multiple positive blood cultures,.  Concern as the left heel as the area seeding the positive blood cultures.  Though the wound looks to be stable and not obviously infected we will plan to debride all of the necrotic tissue and  possibly perform a bone biopsy to the left heel.  I discussed this with the patient and consent has been given.  We will plan for surgery tomorrow.   Samara Deist A  11/20/2020, 8:20 AM

## 2020-11-20 NOTE — Anesthesia Preprocedure Evaluation (Addendum)
Anesthesia Evaluation   Patient awake    Reviewed: Allergy & Precautions, NPO status , Patient's Chart, lab work & pertinent test results  Airway Mallampati: II  TM Distance: >3 FB Neck ROM: Full    Dental  (+) Edentulous Upper, Edentulous Lower   Pulmonary shortness of breath, asthma , COPD, former smoker,    + rhonchi        Cardiovascular hypertension, + Peripheral Vascular Disease  Normal cardiovascular exam     Neuro/Psych    GI/Hepatic GERD  ,  Endo/Other  diabetes, Well Controlled, Type 2  Renal/GU      Musculoskeletal  (+) Arthritis ,   Abdominal   Peds  Hematology   Anesthesia Other Findings Cholecystitis, chronic Cholelithiasis COPD  Diabetes mellitus Diabetic retinopathy ([place]) Dyspnea Fatty liver 1992: Fracture of clavicle     Comment:  left  1992: Fracture of ribs, multiple     Comment:  3 ribs GERD Hyperlipidemia Hypertension Paroxysmal SVT  1992: Pelvis fracture (HCC) Peripheral vascular disease (HCC) Pleurisy Seasonal allergies 1. Left ventricular ejection fraction, by estimation, is 60 to 65%. The  left ventricle has normal function. The left ventricle has no regional  wall motion abnormalities. Left ventricular diastolic parameters were  normal.  2. Right ventricular systolic function is normal. The right ventricular  size is normal.  3. The mitral valve is normal in structure. Trivial mitral valve  regurgitation. No evidence of mitral stenosis.  4. The aortic valve is normal in structure. Aortic valve regurgitation is  not visualized. No aortic stenosis is present.  5. The inferior vena cava is normal in size with greater than 50%  respiratory variability, suggesting right atrial pressure of 3 mmHg.   Reproductive/Obstetrics                                                         Anesthesia Evaluation  Patient identified by MRN, date of birth, ID  band Patient awake    Reviewed: Allergy & Precautions, H&P , NPO status , Patient's Chart, lab work & pertinent test results  Airway Mallampati: I  TM Distance: >3 FB     Dental  (+) Edentulous Lower, Edentulous Upper   Pulmonary shortness of breath, COPD, former smoker,           Cardiovascular hypertension, + Peripheral Vascular Disease       Neuro/Psych negative neurological ROS  negative psych ROS   GI/Hepatic Neg liver ROS, GERD  ,  Endo/Other  diabetes  Renal/GU negative Renal ROS  negative genitourinary   Musculoskeletal   Abdominal   Peds  Hematology negative hematology ROS (+)   Anesthesia Other Findings Past Medical History: No date: Cholecystitis, chronic No date: Cholelithiasis No date: COPD (chronic obstructive pulmonary disease) (HCC) No date: Diabetes mellitus without complication ([place]) No date: Dyspnea 2008: Fatty liver 1992: Fracture of clavicle     Comment:  left  1992: Fracture of ribs, multiple     Comment:  3 ribs No date: GERD (gastroesophageal reflux disease) No date: Hyperlipidemia No date: Hypertension No date: Paroxysmal SVT (supraventricular tachycardia) ([place]) 1992: Pelvis fracture ([place]) No date: Peripheral vascular disease ([place]) No date: Pleurisy No date: Pleurisy No date: Seasonal allergies  Past Surgical History: 02/25/2017: AMPUTATION TOE; Left     Comment:  Procedure: AMPUTATION TOE-LEFT 2ND  MPJ;  Surgeon:               Samara Deist, DPM;  Location: [facility] ORS;  Service:               Podiatry;  Laterality: Left; 10/20/2018: AMPUTATION TOE; Left     Comment:  Procedure: TOE MPJ T2 LEFT;  Surgeon: Samara Deist,               DPM;  Location: [facility] ORS;  Service: Podiatry;                Laterality: Left; 2008: BACK SURGERY No date: CHOLECYSTECTOMY 08/15/2017: COLONOSCOPY WITH PROPOFOL; N/A     Comment:  Procedure: COLONOSCOPY WITH PROPOFOL;  Surgeon:               Lollie Sails, MD;  Location:  [facility] ENDOSCOPY;                Service: Endoscopy;  Laterality: N/A; 05/12/2017: ENDARTERECTOMY; Right     Comment:  Procedure: ENDARTERECTOMY CAROTID;  Surgeon: [name], MD;  Location: [facility] ORS;  Service: Vascular;                Laterality: Right; 12/01/2018: ENDARTERECTOMY FEMORAL; Left     Comment:  Procedure: ENDARTERECTOMY FEMORAL;  Surgeon: [name], MD;  Location: [facility] ORS;  Service: Vascular;                Laterality: Left; No date: ERCP No date: FOOT SURGERY; Right     Comment:  x 3 12/01/2018: LOWER EXTREMITY ANGIOGRAM; Left     Comment:  Procedure: LOWER EXTREMITY ANGIOGRAM;  Surgeon: [name], MD;  Location: [facility] ORS;  Service: Vascular;                Laterality: Left; 10/30/2018: LOWER EXTREMITY ANGIOGRAPHY; Left     Comment:  Procedure: LOWER EXTREMITY ANGIOGRAPHY;  Surgeon: [name], MD;  Location: [place] CV LAB;  Service:               Cardiovascular;  Laterality: Left; 11/30/2018: LOWER EXTREMITY ANGIOGRAPHY; Left     Comment:  Procedure: LOWER EXTREMITY ANGIOGRAPHY;  Surgeon: [name], MD;  Location: [place] CV LAB;  Service:               Cardiovascular;  Laterality: Left;  BMI    Body Mass Index:  25.99 kg/m      Reproductive/Obstetrics negative OB ROS                             Anesthesia Physical Anesthesia Plan  ASA: III  Anesthesia Plan: General   Post-op Pain Management:    Induction:   PONV Risk Score and Plan: Propofol infusion and TIVA  Airway Management Planned: Simple Face Mask  Additional Equipment:   Intra-op Plan:   Post-operative Plan:   Informed Consent: I have reviewed the patients History and Physical, chart, labs and discussed the procedure including the risks, benefits and alternatives for  the proposed anesthesia with the patient or authorized representative who has indicated his/her understanding and  acceptance.     Dental Advisory Given  Plan Discussed with: Anesthesiologist and CRNA  Anesthesia Plan Comments:         Anesthesia Quick Evaluation                                   Anesthesia Evaluation  Patient identified by MRN, date of birth, ID band Patient awake    Reviewed: Allergy & Precautions, NPO status , Patient's Chart, lab work & pertinent test results  History of Anesthesia Complications Negative for: history of anesthetic complications  Airway Mallampati: II  TM Distance: >3 FB Neck ROM: Full    Dental  (+) Edentulous Lower, Edentulous Upper   Pulmonary neg sleep apnea, COPD, former smoker,    breath sounds clear to auscultation- rhonchi (-) wheezing      Cardiovascular hypertension, Pt. on medications + Peripheral Vascular Disease  (-) CAD, (-) Past MI, (-) Cardiac Stents and (-) CABG  Rhythm:Regular Rate:Normal - Systolic murmurs and - Diastolic murmurs    Neuro/Psych neg Seizures negative neurological ROS  negative psych ROS   GI/Hepatic Neg liver ROS, GERD  ,  Endo/Other  diabetes, Insulin Dependent  Renal/GU negative Renal ROS     Musculoskeletal  (+) Arthritis ,   Abdominal (+) - obese,   Peds  Hematology negative hematology ROS (+)   Anesthesia Other Findings Past Medical History: No date: Cholecystitis, chronic No date: Cholelithiasis No date: COPD (chronic obstructive pulmonary disease) (HCC) No date: Diabetes mellitus without complication ([place]) No date: Dyspnea 2008: Fatty liver 1992: Fracture of clavicle     Comment:  left  1992: Fracture of ribs, multiple     Comment:  3 ribs No date: GERD (gastroesophageal reflux disease) No date: Hyperlipidemia No date: Hypertension No date: Paroxysmal SVT (supraventricular tachycardia) ([place]) 1992: Pelvis fracture (HCC) No date: Peripheral vascular disease ([place]) No date: Pleurisy No date: Pleurisy No date: Seasonal allergies   Reproductive/Obstetrics                             Anesthesia Physical Anesthesia Plan  ASA: III  Anesthesia Plan: General   Post-op Pain Management:    Induction: Intravenous  PONV Risk Score and Plan: 1 and Ondansetron and Midazolam  Airway Management Planned: Oral ETT  Additional Equipment:   Intra-op Plan:   Post-operative Plan: Extubation in OR  Informed Consent: I have reviewed the patients History and Physical, chart, labs and discussed the procedure including the risks, benefits and alternatives for the proposed anesthesia with the patient or authorized representative who has indicated his/her understanding and acceptance.     Dental advisory given  Plan Discussed with: CRNA and Anesthesiologist  Anesthesia Plan Comments:         Anesthesia Quick Evaluation                                   Anesthesia Evaluation  Patient identified by MRN, date of birth, ID band Patient awake    Reviewed: Allergy & Precautions, H&P , NPO status , Patient's Chart, lab work & pertinent test results  Airway Mallampati: I  TM Distance: >3 FB     Dental  (+) Edentulous Lower, Edentulous Upper  Pulmonary shortness of breath, COPD, former smoker,           Cardiovascular hypertension, + Peripheral Vascular Disease       Neuro/Psych negative neurological ROS  negative psych ROS   GI/Hepatic Neg liver ROS, GERD  ,  Endo/Other  diabetes  Renal/GU negative Renal ROS  negative genitourinary   Musculoskeletal   Abdominal   Peds  Hematology negative hematology ROS (+)   Anesthesia Other Findings Past Medical History: No date: Cholecystitis, chronic No date: Cholelithiasis No date: COPD (chronic obstructive pulmonary disease) (HCC) No date: Diabetes mellitus without complication ([place]) No date: Dyspnea 2008: Fatty liver 1992: Fracture of clavicle     Comment:  left  1992: Fracture of ribs, multiple     Comment:  3 ribs No date: GERD  (gastroesophageal reflux disease) No date: Hyperlipidemia No date: Hypertension No date: Paroxysmal SVT (supraventricular tachycardia) ([place]) 1992: Pelvis fracture ([place]) No date: Peripheral vascular disease ([place]) No date: Pleurisy No date: Pleurisy No date: Seasonal allergies  Past Surgical History: 02/25/2017: AMPUTATION TOE; Left     Comment:  Procedure: AMPUTATION TOE-LEFT 2ND MPJ;  Surgeon:               Samara Deist, DPM;  Location: [facility] ORS;  Service:               Podiatry;  Laterality: Left; 10/20/2018: AMPUTATION TOE; Left     Comment:  Procedure: TOE MPJ T2 LEFT;  Surgeon: Samara Deist,               DPM;  Location: [facility] ORS;  Service: Podiatry;                Laterality: Left; 2008: BACK SURGERY No date: CHOLECYSTECTOMY 08/15/2017: COLONOSCOPY WITH PROPOFOL; N/A     Comment:  Procedure: COLONOSCOPY WITH PROPOFOL;  Surgeon:               Lollie Sails, MD;  Location: [facility] ENDOSCOPY;                Service: Endoscopy;  Laterality: N/A; 05/12/2017: ENDARTERECTOMY; Right     Comment:  Procedure: ENDARTERECTOMY CAROTID;  Surgeon: [name], MD;  Location: [facility] ORS;  Service: Vascular;                Laterality: Right; 12/01/2018: ENDARTERECTOMY FEMORAL; Left     Comment:  Procedure: ENDARTERECTOMY FEMORAL;  Surgeon: [name], MD;  Location: [facility] ORS;  Service: Vascular;                Laterality: Left; No date: ERCP No date: FOOT SURGERY; Right     Comment:  x 3 12/01/2018: LOWER EXTREMITY ANGIOGRAM; Left     Comment:  Procedure: LOWER EXTREMITY ANGIOGRAM;  Surgeon: [name], MD;  Location: [facility] ORS;  Service: Vascular;                Laterality: Left; 10/30/2018: LOWER EXTREMITY ANGIOGRAPHY; Left     Comment:  Procedure: LOWER EXTREMITY ANGIOGRAPHY;  Surgeon: [name], MD;  Location: [place] CV LAB;  Service:  Cardiovascular;  Laterality: Left; 11/30/2018: LOWER EXTREMITY  ANGIOGRAPHY; Left     Comment:  Procedure: LOWER EXTREMITY ANGIOGRAPHY;  Surgeon: [name], MD;  Location: [place] CV LAB;  Service:               Cardiovascular;  Laterality: Left;  BMI    Body Mass Index:  25.99 kg/m      Reproductive/Obstetrics negative OB ROS                             Anesthesia Physical Anesthesia Plan  ASA: III  Anesthesia Plan: General   Post-op Pain Management:    Induction:   PONV Risk Score and Plan: Propofol infusion and TIVA  Airway Management Planned: Simple Face Mask  Additional Equipment:   Intra-op Plan:   Post-operative Plan:   Informed Consent: I have reviewed the patients History and Physical, chart, labs and discussed the procedure including the risks, benefits and alternatives for the proposed anesthesia with the patient or authorized representative who has indicated his/her understanding and acceptance.     Dental Advisory Given  Plan Discussed with: Anesthesiologist and CRNA  Anesthesia Plan Comments:         Anesthesia Quick Evaluation  Anesthesia Physical Anesthesia Plan  ASA: III  Anesthesia Plan: General   Post-op Pain Management:    Induction: Intravenous  PONV Risk Score and Plan: 1  Airway Management Planned: LMA  Additional Equipment:   Intra-op Plan:   Post-operative Plan:   Informed Consent: I have reviewed the patients History and Physical, chart, labs and discussed the procedure including the risks, benefits and alternatives for the proposed anesthesia with the patient or authorized representative who has indicated his/her understanding and acceptance.   Patient has DNR.  Suspend DNR and Discussed DNR with patient.     Plan Discussed with: CRNA, Anesthesiologist and Surgeon  Anesthesia Plan Comments: (Pt states he signed DNR but would like to have that removed from his chart.  For now have discussed suspending DNR)        Anesthesia Quick Evaluation

## 2020-11-20 NOTE — Progress Notes (Signed)
Occupational Therapy Treatment Patient Details Name: [name] MRN: [id number] DOB: [birth year] Today's Date: 11/20/2020    History of present illness [name] is a 70 y.o. male with medical history significant for hypertension, nonischemic cardiomyopathy, non-insulin-dependent DM, peripheral neuropathy, and PAD, who presents to the ED following a visit with his PCP for SOB and swelling and pain in L LE. He endorses weakness and shortness of breath with exertion and increased sleeping and poor PO intake. He reports this has been ongoing for the last 2 weeks. He further endorses nausea and diarrhea x 2 weeks, with worsening the in last 2 days prompting evaluation at PCP. Pt reports unchanged baseline dry cough. He denies chest pain, abdominal pain, dysuria, hematuria. He endorses left lower extremity edema that has been ongoing for the last 2 weeks and worsening in the last 2 days. At PCP, he was advised to present to the ED due to his left lower extremity swelling and shortness of breath. Pt reports he was aware of the diabetic foot ulcer on the medial great toe, however he was unaware of the wound on the left heel.   OT comments  Pt seen for OT treatment this date in to f/u re: safety with ADLs/ADL mobility. OT facilitates ed re: rationale behind WB restrictions, importance of oral care even if pt with poor dentition/no teeth. Pt with moderate reception, good understanding. Poor adherance to WB restriction potentially r/t decreased sensation in LEs/feet. Pt participates in SUPV sit to stand transfer and CGA to SUPV fxl mobility to/from sink in room to perform oral care in standing with RW sink-side with SUPV/SETUP. Pt tolerates well. Left in chair with all needs met and in reach.  Will continue to follow acutely. Continue to recommend f/u with [place] for safety in the natural environment.    Follow Up Recommendations  Home health OT;Supervision - Intermittent    Equipment Recommendations   Tub/shower seat    Recommendations for Other Services      Precautions / Restrictions Precautions Precautions: Fall Restrictions Weight Bearing Restrictions: Yes LLE Weight Bearing: Touchdown weight bearing LLE Partial Weight Bearing Percentage or Pounds: Toe Touch WB with post op shoe       Mobility Bed Mobility               General bed mobility comments: pt to chair pre/post session    Transfers Overall transfer level: Needs assistance Equipment used: Rolling walker (2 wheeled) Transfers: Sit to/from Stand Sit to Stand: Supervision         General transfer comment: cues for WB, not physical assist, one cue for hand placement prior to initial sit to stand. Pt demos good concentric control to CTS.    Balance Overall balance assessment: Needs assistance   Sitting balance-Leahy Scale: Good     Standing balance support: Bilateral upper extremity supported Standing balance-Leahy Scale: Fair                             ADL either performed or assessed with clinical judgement   ADL Overall ADL's : Needs assistance/impaired     Grooming: Wash/dry face;Oral care;Supervision/safety;Min guard;Standing Grooming Details (indicate cue type and reason): with RW, sink-side, for ~6 mins to complete self care tasks, with poor adherance to WB, whether d/t poor sensation or poor understanding, does not adhere even when OT cues him TTWB only.  Functional mobility during ADLs: Supervision/safety;Min guard;Rolling walker (to complete fxl mobility to/from recliner on window-side of room with RW, requires increased time and cues for adherance to TTWB)       Vision Baseline Vision/History: Wears glasses Wears Glasses: Reading only Patient Visual Report: No change from baseline     Perception     Praxis      Cognition Arousal/Alertness: Awake/alert Behavior During Therapy: WFL for tasks assessed/performed Overall  Cognitive Status: Within Functional Limits for tasks assessed                                 General Comments: Pt is A and O x 4 and very cooperative. Poor ability to maintain proper wt bearing even with max vcs.        Exercises Other Exercises Other Exercises: OT facilitates ed re: rationale behind WB restrictions, importance of oral care even if pt with poor dentition/no teeth. Pt with moderate reception, good understanding. Poor adherance to WB restriction potentially r/t decreased sensation in LEs/feet. Pt participates in SUPV sit to stand transfer and CGA to SUPV fxl mobility to/from sink in room to perform oral care in standing with RW sink-side with SUPV/SETUP. Pt tolerates well. Left in chair with all needs met and in reach.   Shoulder Instructions       General Comments      Pertinent Vitals/ Pain       Pain Assessment: 0-10 Pain Score: 3  Pain Location: L LE with movement, reports it has improved a lot Pain Descriptors / Indicators: Tender;Numbness Pain Intervention(s): Limited activity within patient's tolerance;Monitored during session  Home Living                                          Prior Functioning/Environment              Frequency  Min 1X/week        Progress Toward Goals  OT Goals(current goals can now be found in the care plan section)  Progress towards OT goals: Progressing toward goals  Acute Rehab OT Goals Patient Stated Goal: to go home OT Goal Formulation: With patient Time For Goal Achievement: 11/27/20  Plan Discharge plan remains appropriate;Frequency remains appropriate    Co-evaluation                 AM-PAC OT "6 Clicks" Daily Activity     Outcome Measure   Help from another person eating meals?: None Help from another person taking care of personal grooming?: A Little Help from another person toileting, which includes using toliet, bedpan, or urinal?: A Little Help from another  person bathing (including washing, rinsing, drying)?: A Little Help from another person to put on and taking off regular upper body clothing?: None Help from another person to put on and taking off regular lower body clothing?: A Little 6 Click Score: 20    End of Session Equipment Utilized During Treatment: Gait belt;Rolling walker  OT Visit Diagnosis: Other abnormalities of gait and mobility (R26.89);Muscle weakness (generalized) (M62.81)   Activity Tolerance Patient tolerated treatment well   Patient Left in bed;with call bell/phone within reach;with family/visitor present   Nurse Communication Mobility status        Time: 0947-0962 OT Time Calculation (min): 19 min  Charges: OT General Charges $OT Visit:  1 Visit OT Treatments $Self Care/Home Management : 8-22 mins  Gerrianne Scale, MS, OTR/L ascom [phone] 11/20/20, 4:00 PM

## 2020-11-21 ENCOUNTER — Encounter
Admission: EM | Disposition: A | Discharge status: Home Health | Payer: Self-pay | Source: Home / Self Care | Attending: Internal Medicine

## 2020-11-21 ENCOUNTER — Inpatient Hospital Stay: Payer: Medicare HMO | Admitting: Anesthesiology

## 2020-11-21 DIAGNOSIS — B952 Enterococcus as the cause of diseases classified elsewhere: Secondary | ICD-10-CM | POA: Diagnosis not present

## 2020-11-21 DIAGNOSIS — E11621 Type 2 diabetes mellitus with foot ulcer: Secondary | ICD-10-CM | POA: Diagnosis not present

## 2020-11-21 DIAGNOSIS — E11628 Type 2 diabetes mellitus with other skin complications: Secondary | ICD-10-CM | POA: Diagnosis not present

## 2020-11-21 DIAGNOSIS — R7881 Bacteremia: Secondary | ICD-10-CM | POA: Diagnosis not present

## 2020-11-21 DIAGNOSIS — B9561 Methicillin susceptible Staphylococcus aureus infection as the cause of diseases classified elsewhere: Secondary | ICD-10-CM | POA: Diagnosis not present

## 2020-11-21 DIAGNOSIS — I10 Essential (primary) hypertension: Secondary | ICD-10-CM | POA: Diagnosis not present

## 2020-11-21 HISTORY — PX: IRRIGATION AND DEBRIDEMENT FOOT: SHX6602

## 2020-11-21 HISTORY — PX: BONE BIOPSY: SHX375

## 2020-11-21 LAB — MAGNESIUM: Magnesium: 2.1 mg/dL (ref 1.7–2.4)

## 2020-11-21 LAB — GLUCOSE, CAPILLARY
Glucose-Capillary: 208 mg/dL — ABNORMAL HIGH (ref 70–99)
Glucose-Capillary: 156 mg/dL — ABNORMAL HIGH (ref 70–99)
Glucose-Capillary: 151 mg/dL — ABNORMAL HIGH (ref 70–99)
Glucose-Capillary: 126 mg/dL — ABNORMAL HIGH (ref 70–99)
Glucose-Capillary: 179 mg/dL — ABNORMAL HIGH (ref 70–99)

## 2020-11-21 LAB — CREATININE, SERUM
Creatinine, Ser: 0.92 mg/dL (ref 0.61–1.24)
GFR, Estimated: >60 mL/min (ref >60)

## 2020-11-21 SURGERY — IRRIGATION AND DEBRIDEMENT FOOT
Anesthesia: General | Site: Heel | Laterality: Left

## 2020-11-21 MED ORDER — LACTATED RINGERS IV SOLN: INTRAVENOUS | Status: DC | PRN

## 2020-11-21 MED ORDER — FENTANYL CITRATE (PF) 100 MCG/2ML IJ SOLN
INTRAMUSCULAR | Status: DC | PRN
  Administered 2020-11-21: 50 ug via INTRAVENOUS

## 2020-11-21 MED ORDER — LIDOCAINE HCL (PF) 2 % IJ SOLN
INTRAMUSCULAR | Status: AC
  Filled 2020-11-21: qty 5

## 2020-11-21 MED ORDER — FENTANYL CITRATE (PF) 100 MCG/2ML IJ SOLN: 25.0000 ug | INTRAMUSCULAR | Status: DC | PRN

## 2020-11-21 MED ORDER — MEPERIDINE HCL 50 MG/ML IJ SOLN: 6.2500 mg | INTRAMUSCULAR | Status: DC | PRN

## 2020-11-21 MED ORDER — FENTANYL CITRATE (PF) 100 MCG/2ML IJ SOLN
INTRAMUSCULAR | Status: AC
  Filled 2020-11-21: qty 2

## 2020-11-21 MED ORDER — ONDANSETRON HCL 4 MG/2ML IJ SOLN: 4.0000 mg | Freq: Once | INTRAMUSCULAR | Status: DC | PRN

## 2020-11-21 MED ORDER — ONDANSETRON HCL 4 MG/2ML IJ SOLN
INTRAMUSCULAR | Status: DC | PRN
  Administered 2020-11-21: 4 mg via INTRAVENOUS

## 2020-11-21 MED ORDER — LIDOCAINE HCL (CARDIAC) PF 100 MG/5ML IV SOSY
PREFILLED_SYRINGE | INTRAVENOUS | Status: DC | PRN
  Administered 2020-11-21: 100 mg via INTRAVENOUS

## 2020-11-21 MED ORDER — PROPOFOL 10 MG/ML IV BOLUS
INTRAVENOUS | Status: DC | PRN
  Administered 2020-11-21: 80 mg via INTRAVENOUS
  Administered 2020-11-21: 120 mg via INTRAVENOUS

## 2020-11-21 MED ORDER — LIDOCAINE-EPINEPHRINE (PF) 1 %-1:200000 IJ SOLN
INTRAMUSCULAR | Status: DC | PRN
  Administered 2020-11-21: 20 mL

## 2020-11-21 MED ORDER — DEXAMETHASONE SODIUM PHOSPHATE 10 MG/ML IJ SOLN
INTRAMUSCULAR | Status: DC | PRN
  Administered 2020-11-21: 10 mg via INTRAVENOUS

## 2020-11-21 MED ORDER — PHENYLEPHRINE HCL (PRESSORS) 10 MG/ML IV SOLN
INTRAVENOUS | Status: DC | PRN
  Administered 2020-11-21 (×4): 200 ug via INTRAVENOUS

## 2020-11-21 SURGICAL SUPPLY — 66 items
BLADE OSC/SAGITTAL MD 5.5X18 (BLADE) IMPLANT
BLADE OSCILLATING/SAGITTAL (BLADE)
BLADE SW THK.38XMED LNG THN (BLADE) IMPLANT
BNDG COHESIVE 4X5 TAN STRL (GAUZE/BANDAGES/DRESSINGS) ×2 IMPLANT
BNDG COHESIVE 6X5 TAN STRL LF (GAUZE/BANDAGES/DRESSINGS) ×2 IMPLANT
BNDG CONFORM 2 STRL LF (GAUZE/BANDAGES/DRESSINGS) ×2 IMPLANT
BNDG CONFORM 3 STRL LF (GAUZE/BANDAGES/DRESSINGS) ×2 IMPLANT
BNDG ELASTIC 4X5.8 VLCR STR LF (GAUZE/BANDAGES/DRESSINGS) ×2 IMPLANT
BNDG ESMARK 4X12 TAN STRL LF (GAUZE/BANDAGES/DRESSINGS) ×2 IMPLANT
BNDG GAUZE 4.5X4.1 6PLY STRL (MISCELLANEOUS) ×2 IMPLANT
CANISTER SUCT 1200ML W/VALVE (MISCELLANEOUS) ×2 IMPLANT
CANISTER SUCT 3000ML PPV (MISCELLANEOUS) IMPLANT
CANISTER WOUND CARE 500ML ATS (WOUND CARE) ×2 IMPLANT
COVER WAND RF STERILE (DRAPES) ×2 IMPLANT
CUFF TOURN SGL QUICK 12 (TOURNIQUET CUFF) ×2 IMPLANT
CUFF TOURN SGL QUICK 18X4 (TOURNIQUET CUFF) IMPLANT
DRAPE FLUOR MINI C-ARM 54X84 (DRAPES) IMPLANT
DRAPE XRAY CASSETTE 23X24 (DRAPES) IMPLANT
DRSG MEPILEX FLEX 3X3 (GAUZE/BANDAGES/DRESSINGS) ×4 IMPLANT
DURAPREP 26ML APPLICATOR (WOUND CARE) ×2 IMPLANT
ELECT REM PT RETURN 9FT ADLT (ELECTROSURGICAL) ×2
ELECTRODE REM PT RTRN 9FT ADLT (ELECTROSURGICAL) ×1 IMPLANT
GAUZE PACKING 1/4 X5 YD (GAUZE/BANDAGES/DRESSINGS) ×2 IMPLANT
GAUZE PACKING IODOFORM 1X5 (PACKING) ×2 IMPLANT
GAUZE SPONGE 4X4 12PLY STRL (GAUZE/BANDAGES/DRESSINGS) ×2 IMPLANT
GAUZE XEROFORM 1X8 LF (GAUZE/BANDAGES/DRESSINGS) ×2 IMPLANT
GLOVE INDICATOR 8.0 STRL GRN (GLOVE) ×2 IMPLANT
GLOVE SURG ENC MOIS LTX SZ7.5 (GLOVE) ×2 IMPLANT
GOWN STRL REUS W/ TWL XL LVL3 (GOWN DISPOSABLE) ×2 IMPLANT
GOWN STRL REUS W/TWL MED LVL3 (GOWN DISPOSABLE) ×4 IMPLANT
GOWN STRL REUS W/TWL XL LVL3 (GOWN DISPOSABLE) ×4
HANDPIECE VERSAJET DEBRIDEMENT (MISCELLANEOUS) ×2 IMPLANT
IV NS 1000ML (IV SOLUTION) ×2
IV NS 1000ML BAXH (IV SOLUTION) ×1 IMPLANT
KIT DRSG VAC SLVR GRANUFM (MISCELLANEOUS) ×2 IMPLANT
KIT TURNOVER KIT A (KITS) ×2 IMPLANT
LABEL OR SOLS (LABEL) ×2 IMPLANT
MANIFOLD NEPTUNE II (INSTRUMENTS) ×2 IMPLANT
NEEDLE FILTER BLUNT 18X 1/2SAF (NEEDLE) ×1
NEEDLE FILTER BLUNT 18X1 1/2 (NEEDLE) ×1 IMPLANT
NEEDLE HYPO 25X1 1.5 SAFETY (NEEDLE) ×2 IMPLANT
NS IRRIG 500ML POUR BTL (IV SOLUTION) ×2 IMPLANT
PACK EXTREMITY ARMC (MISCELLANEOUS) ×2 IMPLANT
PAD ABD DERMACEA PRESS 5X9 (GAUZE/BANDAGES/DRESSINGS) ×2 IMPLANT
PULSAVAC PLUS IRRIG FAN TIP (DISPOSABLE)
PUNCH BIOPSY 4MM (MISCELLANEOUS) ×2
PUNCH BIOPSY DERMAL 6MM STRL (MISCELLANEOUS) ×2 IMPLANT
PUNCH BIOPSY DISP 4 (MISCELLANEOUS) ×1 IMPLANT
RASP SM TEAR CROSS CUT (RASP) IMPLANT
SHIELD FULL FACE ANTIFOG 7M (MISCELLANEOUS) ×2 IMPLANT
SOL .9 NS 3000ML IRR  AL (IV SOLUTION) ×1
SOL .9 NS 3000ML IRR AL (IV SOLUTION) ×1
SOL .9 NS 3000ML IRR UROMATIC (IV SOLUTION) ×1 IMPLANT
SOL PREP PVP 2OZ (MISCELLANEOUS) ×2
SOLUTION PREP PVP 2OZ (MISCELLANEOUS) ×1 IMPLANT
STOCKINETTE IMPERVIOUS 9X36 MD (GAUZE/BANDAGES/DRESSINGS) ×2 IMPLANT
SUT ETHILON 2 0 FS 18 (SUTURE) ×4 IMPLANT
SUT ETHILON 4-0 (SUTURE) ×2
SUT ETHILON 4-0 FS2 18XMFL BLK (SUTURE) ×1
SUT VIC AB 3-0 SH 27 (SUTURE) ×2
SUT VIC AB 3-0 SH 27X BRD (SUTURE) ×1 IMPLANT
SUT VIC AB 4-0 FS2 27 (SUTURE) ×2 IMPLANT
SUTURE ETHLN 4-0 FS2 18XMF BLK (SUTURE) ×1 IMPLANT
SWAB CULTURE AMIES ANAERIB BLU (MISCELLANEOUS) IMPLANT
SYR 10ML LL (SYRINGE) ×4 IMPLANT
TIP FAN IRRIG PULSAVAC PLUS (DISPOSABLE) IMPLANT

## 2020-11-21 NOTE — TOC Progression Note (Signed)
Transition of Care [facility]) - Progression Note    Patient Details  Name: DANYAEL ALIPIO MRN: [id number] Date of Birth: Apr 02, 1951  Transition of Care [facility] CM/SW Contact  Beverly Sessions, RN Phone Number: 11/21/2020, 9:47 AM  Clinical Narrative:    Plan for debridement of the heel wound with culture and bone biopsy today    Expected Discharge Plan: [place] Barriers to Discharge: Continued Medical Work up  Expected Discharge Plan and Services Expected Discharge Plan: [place]   Discharge Planning Services: CM Consult Post Acute Care Choice: [place] arrangements for the past 2 months: Single Family Home                           HH Arranged: RN,PT,OT,Nurse's Aide [place]: [facility] Date [place]: 11/17/20       Social Determinants of Health (SDOH) Interventions    Readmission Risk Interventions No flowsheet data found.

## 2020-11-21 NOTE — Progress Notes (Signed)
[facility] [place] NAME: Alcus Bradly    MR#:  [id number]  DATE OF BIRTH:  03-15-1951  SUBJECTIVE:  patient overall clinically improving. He ambulate using walker with using wedge boot in the room. Denies any new complaints. REVIEW OF SYSTEMS:   Review of Systems  Constitutional: Negative for chills, fever and weight loss.  HENT: Negative for ear discharge, ear pain and nosebleeds.   Eyes: Negative for blurred vision, pain and discharge.  Respiratory: Negative for sputum production, shortness of breath, wheezing and stridor.   Cardiovascular: Negative for chest pain, palpitations, orthopnea and PND.  Gastrointestinal: Negative for abdominal pain, diarrhea, nausea and vomiting.  Genitourinary: Negative for frequency and urgency.  Musculoskeletal: Negative for back pain and joint pain.  Neurological: Positive for weakness. Negative for sensory change, speech change and focal weakness.  Psychiatric/Behavioral: Negative for depression and hallucinations. The patient is not nervous/anxious.    Tolerating Diet:yesTolerating PT: HHPT  DRUG ALLERGIES:   Allergies  Allergen Reactions  . Penicillins Rash and Other (See Comments)    Rash in and around the mouth Has patient had a PCN reaction causing immediate rash, facial/tongue/throat swelling, SOB or lightheadedness with hypotension: Yes Has patient had a PCN reaction causing severe rash involving mucus membranes or skin necrosis: Yes Has patient had a PCN reaction that required hospitalization: No Has patient had a PCN reaction occurring within the last 10 years: Yes If all of the above answers are "NO", then may proceed with Cephalosporin use.   . Bactrim [Sulfamethoxazole-Trimethoprim] Other (See Comments)    Mouth sores, Sores develop in mouth    VITALS:  Blood pressure 115/78, pulse 99, temperature 97.7 F (36.5 C), temperature source Oral, resp. rate 18, height 5\' 10"  (1.778 m),  weight 82 kg, SpO2 95 %.  PHYSICAL EXAMINATION:   Physical Exam  GENERAL:  70 y.o.-year-old patient lying in the bed with no acute distress.  LUNGS: Normal breath sounds bilaterally, no wheezing, rales, rhonchi. No use of accessory muscles of respiration.  CARDIOVASCULAR: S1, S2 normal. No murmurs, rubs, or gallops.  ABDOMEN: Soft, nontender, nondistended. Bowel sounds present. No organomegaly or mass.  EXTREMITIES:    NEUROLOGIC: grossly nonfocal PSYCHIATRIC:  patient is alert and oriented x 3.  SKIN: as above  LABORATORY PANEL:  CBC Recent Labs  Lab 11/19/20 0424  WBC 4.9  HGB 11.2*  HCT 34.2*  PLT 176    Chemistries  Recent Labs  Lab 11/19/20 0424 11/20/20 0407 11/20/20 2328  NA 133*  --   --   K 4.5  --   --   CL 99  --   --   CO2 27  --   --   GLUCOSE 213*  --   --   BUN 13  --   --   CREATININE 0.99   < > 0.92  CALCIUM 8.7*  --   --   MG 2.1   < > 2.1  AST 19  --   --   ALT 18  --   --   ALKPHOS 51  --   --   BILITOT 1.2  --   --    < > = values in this interval not displayed.   Cardiac Enzymes No results for input(s): TROPONINI in the last 168 hours. RADIOLOGY:  No results found. ASSESSMENT AND PLAN:  70 year old with history of essential hypertension, nonischemic cardiomyopathy, DM2, peripheral neuropathy, PAD, history of tobacco use quit 1998  presents with shortness of breath, poor oral intake nausea, diarrhea with left lower extremity swelling with worsening diabetic foot infection.    Patient has been fully vaccinated and boosted against COVID-19.  Sepsis secondary to diabetic foot infection of his left lower extremity with ulceration and cellulitis, nonpurulent MSSA/Enterococcus bacteremia, persistent bacteremia -MRI foot-showing cellulitis and myositis but no evidence of osteomyelitis -Antibiotics-IV daptomycin and vancomycin per ID -Repeat cultures from 2/22 remains negative.  PICC line will be placed when advised by ID -Podiatry-dressing  changes, culture sent.  No surgical indication at this time.  Podiatry team aware of persistent bacteremia, they did not seem to think that is the source and repeat MRI foot will not change the plan.  -Vascular-s/p angiogram on 2/18 with PTA of left peroneal artery -Lower extremity Dopplers-negative for DVT -Echocardiogram-EF 60 to 65% -TEE 2/18-negative for endocarditis -MRI spine with contrast, CT abdomen pelvis with and without contrast negative for discitis/osteomyelitis and abdominal pathology to explain persistent bacterimia -- 2/24-- Dr Vickki Muff plans debridement with possible bone bx tomorrow --2/25--plans for debridedment today. Repeat BC from 2/24 pending   Peripheral arterial disease -Aspirin,Plavix and statin -Status post left lower extremity angio 2/21- status post balloon angioplasty left peroneal artery.  Chronic pain   -Pain control with bowel regimen  Shortness of breath with bilateral pleural effusion/atelectasis without hypoxia, improved -Out of bed to chair.  Incentive spirometer.  As needed bronchodilators liters -Echocardiogram -EF 60 to 65%, no evidence of endocarditis  Diabetes mellitus type 2, acceptable range -Peripheral neuropathy secondary to DM2 -Insulin sliding scale and Accu-Chek -Gabapentin 3 times daily  Hyperlipidemia Cont Statin   DVT prophylaxis: Lovenox Code Status: DNR Family Communication:  Bernice--step dter on the phone  Status is: Inpatient  Remains inpatient appropriate because:Inpatient level of care appropriate due to severity of illness   Dispo: The patient is from: Home  Anticipated d/c is to: Home with HHPT  Anticipated d/c date is: 2-3  days since Podiatry is  planning further exploration               Difficult to place patient No          TOTAL TIME TAKING CARE OF THIS PATIENT: 25 minutes.  >50% time spent on counselling and coordination of care  Note: This dictation  was prepared with Dragon dictation along with smaller phrase technology. Any transcriptional errors that result from this process are unintentional.  Fritzi Mandes M.D    [facility]   CC: Primary care physician; [name], MDPatient ID: Tillie Rung, male   DOB: 12-22-1950, 70 y.o.   MRN: [id number]

## 2020-11-21 NOTE — Treatment Plan (Signed)
Diagnosis: Enterococcus and MSSA bacteremia Left foot infection Baseline Creatinine 1.01    Allergies  Allergen Reactions  . Penicillins Rash and Other (See Comments)    Rash in and around the mouth Has patient had a PCN reaction causing immediate rash, facial/tongue/throat swelling, SOB or lightheadedness with hypotension: Yes Has patient had a PCN reaction causing severe rash involving mucus membranes or skin necrosis: Yes Has patient had a PCN reaction that required hospitalization: No Has patient had a PCN reaction occurring within the last 10 years: Yes If all of the above answers are "NO", then may proceed with Cephalosporin use.   . Bactrim [Sulfamethoxazole-Trimethoprim] Other (See Comments)    Mouth sores, Sores develop in mouth    OPAT Orders Discharge antibiotics: Vancomycin 1 gram Iv every 12 hours  Per pharmacy protocol Aim for Vancomycin trough 15-20 (unless otherwise indicated) Duration: 6 weeks End Date: 12/28/20  [facility] Care Per Protocol:  Labs weekly on Monday while on IV antibiotics: _X_ CBC with differential X__ CMP _X_ Vancomycin trough  Labs weekly on Thursday while on atibiotics X VAnco trough X BMP  _X_ Please pull PIC at completion of IV antibiotics _   Fax weekly labs to [phone]  Clinic Follow Up Appt:12/16/20 at 9.45am   Call [phone] with any questions

## 2020-11-21 NOTE — Progress Notes (Addendum)
Pharmacy Antibiotic Note  [name] is a 70 y.o. male admitted on 11/11/2020 with bacteremia. Pt presented with L foot wounds, s/p debridement on 2/16. Pt has a hx of toe amputations. Pt has an allergy to penicillins with reaction of severe mouth ulcers occurring with both penicillin and ampicillin. 2/15 blood cultures positive for enterococcus faecalis and MSSA. 2/16 wound culture with MSSA, e. Faecalis, and prevotella bivia. Repeat blood cultures from 2/16, 2/19, and 2/20 with e. faecalis. BCx from 2/22 and 2/24 NGTD. TEE on 2/18 negative for vegetation. No concerns for septic arthritis per MRI. No concern for osteo on foot XR or MRI. LLE angiogram completed 2/21. Awaiting PICC placement for IV abx x 6 weeks. Pt undergoing debridement of wound and bone biopsy on 2/25. Pt was on daptomycin now transitioned 2/21 to vancomycin due to uncontrolled infection with repeat positive blood cultures. Pharmacy has been consulted for vancomycin dosing.   Day 4 vanc  Day 3 metronidazole  2/23 vanc pk @ 1222: 39, vanc tr @ 2133: 19 - dose changed from 1250 mg q12h to 1000 mg q12h  Plan: Continue Metronidazole 500 mg TID  Continue Vancomycin IV 1g Q12 hours --Expected AUC: 561.3, trough: 14.7 --SCr used: 0.99 --Scr at least every other day while on vancomycin  Height: 5\' 10"  (177.8 cm) Weight: 82 kg (180 lb 12.4 oz) IBW/kg (Calculated) : 73  Temp (24hrs), Avg:97.9 F (36.6 C), Min:97.6 F (36.4 C), Max:98.5 F (36.9 C)  Recent Labs  Lab 11/15/20 0417 11/16/20 0523 11/17/20 0458 11/18/20 0003 11/19/20 0424 11/19/20 1222 11/19/20 2133 11/20/20 0407 11/20/20 2328  WBC 4.9 4.7 4.8 5.4 4.9  --   --   --   --   CREATININE 0.85 0.83 0.85 0.86 0.99  --   --  1.01 0.92  VANCOTROUGH  --   --   --   --   --   --  19  --   --   VANCOPEAK  --   --   --   --   --  39  --   --   --     Estimated Creatinine Clearance: 80.4 mL/min (by C-G formula based on SCr of 0.92 mg/dL).    Allergies  Allergen  Reactions  . Penicillins Rash and Other (See Comments)    Rash in and around the mouth Has patient had a PCN reaction causing immediate rash, facial/tongue/throat swelling, SOB or lightheadedness with hypotension: Yes Has patient had a PCN reaction causing severe rash involving mucus membranes or skin necrosis: Yes Has patient had a PCN reaction that required hospitalization: No Has patient had a PCN reaction occurring within the last 10 years: Yes If all of the above answers are "NO", then may proceed with Cephalosporin use.   . Bactrim [Sulfamethoxazole-Trimethoprim] Other (See Comments)    Mouth sores, Sores develop in mouth    Antimicrobials this admission: 2/15 Cefepime >> 2/16 2/15 Metronidazole x 1, 2/22 >> 2/15 Vancomycin >> 2/17, 2/21 >> 2/17 Daptomycin >> 2/22  Microbiology results: 2/15 BCx: E faecalis, MSSA 2/16 BCx: E faecalis 2/16 WCx: MSSA, E faecalis, Prevotella bivia 2/18 BCx: NGTD 2/19 BCx: E faecalis 2/20 BCx: E faecalis 2/22 BCx: NGTD 2/24 BCx: NGTD  Thank you for allowing pharmacy to be a part of this patient's care.  Benn Moulder, PharmD Pharmacy Resident  11/21/2020 9:57 AM

## 2020-11-21 NOTE — Consult Note (Signed)
PHARMACY CONSULT NOTE FOR:  OUTPATIENT  PARENTERAL ANTIBIOTIC THERAPY (OPAT)  Indication: Enterococcus and MSSA bacteremia, left foot infection Regimen: Vancomycin 1000 mg q12h End date: 12/28/20  IV antibiotic discharge orders are pended. To discharging provider:  please sign these orders via discharge navigator,  Select New Orders & click on the button choice - Manage This Unsigned Work.     Thank you for allowing pharmacy to be a part of this patient's care.  Benn Moulder, PharmD Pharmacy Resident  11/21/2020 11:28 AM

## 2020-11-21 NOTE — Progress Notes (Signed)
Patient expressed wanting to change code status. He does not want to be a DNR.

## 2020-11-21 NOTE — Op Note (Signed)
Operative note   Surgeon:Lalena Salas Lawyer: None    Preop diagnosis: Left heel ulceration    Postop diagnosis: Same    Procedure: 1 excisional debridement left heel ulceration to level of muscle 2.  Bone biopsy of left calcaneus 3.  Placement of wound VAC left heel    EBL: Minimal    Anesthesia:local and general    Hemostasis: Epinephrine infiltrated along the surrounding ulcerative site    Specimen: A superficial wound culture was performed as well as a culture of bone to the left heel.  Bone from the heel was also sent for pathology    Complications: None    Operative indications:Royal L Rossin is an 70 y.o. that presents today for surgical intervention.  The risks/benefits/alternatives/complications have been discussed and consent has been given.  Patient has had multiple positive blood cultures.  Unable to identify a source.  Concern the source is the left heel.  The left heel has a large necrotic ulceration and has had periwound bacteria.  Discussed with patient need for debridement of ulcer and wound culture as well as bone biopsy.  Consent has been given.    Procedure:  Patient was brought into the OR and placed on the operating table in thesupine position. After anesthesia was obtained theleft lower extremity was prepped and draped in usual sterile fashion.  Attention was directed to the plantar aspect of the left heel where a large 3-1/2 x 4 cm necrotic eschar was noted.  This was sharply removed with a 15 blade.  Directly underneath the eschar there was some mild purulent drainage.  A culture of this area was taken.  Next a versa jet was used to excisionally debride down to the level of subcutaneous tissue and muscle.  Post debridement measurements were 4 x 4 cm.  Nonviable fibrotic and necrotic tissue was all removed.  Next an incision was made in the central aspect of the heel.  Blunt dissection was carried down to the level of the calcaneus.  At this time a small  trephine of calcaneal bone was removed from the heel.  This was sent for bone culture fresh.  A second 4 mm trephine of bone was then removed and sent for pathological examination.  The wounds were then flushed with copious amounts of irrigation.  A wound VAC was then placed on the left heel with the silver impregnated dressing.      Patient tolerated the procedure and anesthesia well.  Was transported from the OR to the PACU with all vital signs stable and vascular status intact.Marland Kitchen

## 2020-11-21 NOTE — Transfer of Care (Signed)
Immediate Anesthesia Transfer of Care Note  Patient: [name]  Procedure(s) Performed: IRRIGATION AND DEBRIDEMENT FOOT (Left Heel) BONE BIOPSY (Left Heel)  Patient Location: PACU  Anesthesia Type:General  Level of Consciousness: awake, alert  and oriented  Airway & Oxygen Therapy: Patient Spontanous Breathing and Patient connected to face mask oxygen  Post-op Assessment: Report given to RN and Post -op Vital signs reviewed and stable  Post vital signs: Reviewed and stable  Last Vitals:  Vitals Value Taken Time  BP 129/115 11/21/20 1324  Temp    Pulse 86 11/21/20 1326  Resp 18 11/21/20 1326  SpO2 99 % 11/21/20 1326  Vitals shown include unvalidated device data.  Last Pain:  Vitals:   11/21/20 1126  TempSrc: Oral  PainSc:       Patients Stated Pain Goal: 2 (59/93/57 0177)  Complications: No complications documented.

## 2020-11-21 NOTE — Anesthesia Postprocedure Evaluation (Signed)
Anesthesia Post Note  Patient: Ottavio L Voght  Procedure(s) Performed: IRRIGATION AND DEBRIDEMENT FOOT--with placement of wound vac (Left Heel) BONE BIOPSY (Left Heel)  Patient location during evaluation: PACU Anesthesia Type: General Level of consciousness: awake and alert, awake and oriented Pain management: pain level controlled Vital Signs Assessment: post-procedure vital signs reviewed and stable Respiratory status: spontaneous breathing, nonlabored ventilation and respiratory function stable Cardiovascular status: blood pressure returned to baseline and stable Postop Assessment: no apparent nausea or vomiting Anesthetic complications: no   No complications documented.   Last Vitals:  Vitals:   11/21/20 1126 11/21/20 1326  BP: 124/68   Pulse:    Resp: 17   Temp: 36.8 C (!) 36.4 C  SpO2: 96%     Last Pain:  Vitals:   11/21/20 1326  TempSrc:   PainSc: 0-No pain                 Phill Mutter

## 2020-11-21 NOTE — Progress Notes (Signed)
Patient transported to the OR 

## 2020-11-21 NOTE — Progress Notes (Signed)
Date of Admission:  11/11/2020 appropriate antibiotics since 11/11/2020.    ID: [name] is a 70 y.o. male Principal Problem:   Sepsis ([place]) Active Problems:   PAD (peripheral artery disease) ([place])   Diabetes ([place])   Mixed hyperlipidemia   Essential hypertension   GERD (gastroesophageal reflux disease)   Uncontrolled type 2 diabetes mellitus with hyperglycemia ([facility])   Cardiomyopathy, nonischemic ([facility])   Primary generalized (osteo)arthritis   Moderate asthma without complication   Diabetic foot ulcer ([place])   Diabetic foot infection ([place])    Subjective: Patient underwent debridement of the left heel wound today. Doing fine.  Has a wound VAC.  Medications:  . aspirin EC  81 mg Oral Daily  . Chlorhexidine Gluconate Cloth  6 each Topical Q0600  . clopidogrel  75 mg Oral Daily  . docusate sodium  100 mg Oral BID  . enoxaparin (LOVENOX) injection  40 mg Subcutaneous Q24H  . fluticasone  2 spray Each Nare Daily  . gabapentin  300 mg Oral TID  . insulin aspart  0-15 Units Subcutaneous TID WC  . insulin aspart  0-5 Units Subcutaneous QHS  . metroNIDAZOLE  500 mg Oral 3 times per day  . mometasone-formoterol  2 puff Inhalation BID  . mupirocin ointment  1 application Nasal BID  . oxybutynin  5 mg Oral BID  . pantoprazole  40 mg Oral Daily  . polyethylene glycol  17 g Oral Daily  . pravastatin  10 mg Oral q1800    Objective: Vital signs in last 24 hours: Temp:  [97.5 F (36.4 C)-98.7 F (37.1 C)] 98.2 F (36.8 C) (02/25 1525) Pulse Rate:  [85-99] 86 (02/25 1525) Resp:  [12-20] 18 (02/25 1525) BP: (93-124)/(57-79) 101/64 (02/25 1525) SpO2:  [94 %-100 %] 94 % (02/25 1444)  PHYSICAL EXAM:  General: Alert, cooperative, no distress, appears stated age.  Lungs: Clear to auscultation bilaterally. No Wheezing or Rhonchi. No rales. Heart: Regular rate and rhythm, no murmur, rub or gallop. Abdomen: Soft, non-tender,not distended. Bowel sounds normal. No masses Extremities:  Left leg surgical dressing.  Wound VAC present skin: No rashes or lesions. Or bruising Lymph: Cervical, supraclavicular normal. Neurologic: Grossly non-focal  Lab Results Recent Labs    11/19/20 0424 11/20/20 0407 11/20/20 2328  WBC 4.9  --   --   HGB 11.2*  --   --   HCT 34.2*  --   --   NA 133*  --   --   K 4.5  --   --   CL 99  --   --   CO2 27  --   --   BUN 13  --   --   CREATININE 0.99 1.01 0.92   Liver Panel Recent Labs    11/19/20 0424  PROT 7.0  ALBUMIN 3.0*  AST 19  ALT 18  ALKPHOS 51  BILITOT 1.2   ESR 58 CRP 2.1  Microbiology: 11/11/2020 4 out of 4 blood culture positive for Enterococcus faecalis and staph aureus 11/12/2020 1 out of 4 bottle Enterococcus faecalis 11/12/2020 wound culture has Enterococcus and staph aureus and Prevotella brevia.  11/14/2020 blood culture no growth   11/15/2020 blood culture 3 out of 4 bottles Enterococcus faecalis  11/16/2020 4 out of 4 bottles Enterococcus faecalis. 11/18/2020 BC NG  11/20/20 BC- NG Studies/Results: No results found.   Assessment/Plan: Enterococcus bacteremia Staff aureus bacteremia Left heel wound had Enterococcus and staph aureus in culture. Because of persistent Enterococcus bacteremia he  underwent extensive imaging of his spine which were all negative.  CT abdomen was normal.  TEE was negative for endocarditis.  The concern was whether the infected wound had to be debrided versus daptomycin failure. Patient was switched back to vancomycin.  The last 2 cultures have been negative. Today he underwent debridement of the left heel wound and cultures have been sent from the deep tissue including the bone.  Biopsy of the bone has been done. Patient is currently on vancomycin and will need it for at least 4 weeks  Prevotella in the wound culture on Flagyl.  May stop it before discharge.  Left foot ulcer over the plantar aspect of the MTP first area  Diabetes mellitus on insulin  Diabetic  neuropathy  Peripheral artery disease Underwent TTE AAA of the left peroneal artery  Discussed the management with the care team ID will follow him peripherally this weekend.  Call if needed.

## 2020-11-22 ENCOUNTER — Inpatient Hospital Stay: Payer: Self-pay

## 2020-11-22 ENCOUNTER — Encounter: Payer: Self-pay | Admitting: Podiatry

## 2020-11-22 DIAGNOSIS — R7881 Bacteremia: Secondary | ICD-10-CM | POA: Diagnosis not present

## 2020-11-22 DIAGNOSIS — E11628 Type 2 diabetes mellitus with other skin complications: Secondary | ICD-10-CM | POA: Diagnosis not present

## 2020-11-22 DIAGNOSIS — E11621 Type 2 diabetes mellitus with foot ulcer: Secondary | ICD-10-CM | POA: Diagnosis not present

## 2020-11-22 DIAGNOSIS — I10 Essential (primary) hypertension: Secondary | ICD-10-CM | POA: Diagnosis not present

## 2020-11-22 LAB — GLUCOSE, CAPILLARY
Glucose-Capillary: 243 mg/dL — ABNORMAL HIGH (ref 70–99)
Glucose-Capillary: 198 mg/dL — ABNORMAL HIGH (ref 70–99)
Glucose-Capillary: 184 mg/dL — ABNORMAL HIGH (ref 70–99)
Glucose-Capillary: 193 mg/dL — ABNORMAL HIGH (ref 70–99)

## 2020-11-22 LAB — MAGNESIUM: Magnesium: 2.3 mg/dL (ref 1.7–2.4)

## 2020-11-22 MED ORDER — GLYBURIDE 5 MG PO TABS
5.0000 mg | ORAL_TABLET | Freq: Two times a day (BID) | ORAL | Status: DC
  Administered 2020-11-22 – 2020-11-24 (×5): 5 mg via ORAL
  Filled 2020-11-22 (×8): qty 1

## 2020-11-22 NOTE — Progress Notes (Signed)
[place] [facility] [place] NAME: [name]    MR#:  [id number]  DATE OF BIRTH:  [birth year]  SUBJECTIVE:  Denies any new complaints. Status post left heel debridement on Friday, February 25. Wound VAC placed REVIEW OF SYSTEMS:   Review of Systems  Constitutional: Negative for chills, fever and weight loss.  HENT: Negative for ear discharge, ear pain and nosebleeds.   Eyes: Negative for blurred vision, pain and discharge.  Respiratory: Negative for sputum production, shortness of breath, wheezing and stridor.   Cardiovascular: Negative for chest pain, palpitations, orthopnea and PND.  Gastrointestinal: Negative for abdominal pain, diarrhea, nausea and vomiting.  Genitourinary: Negative for frequency and urgency.  Musculoskeletal: Negative for back pain and joint pain.  Neurological: Positive for weakness. Negative for sensory change, speech change and focal weakness.  Psychiatric/Behavioral: Negative for depression and hallucinations. The patient is not nervous/anxious.    Tolerating Diet:yesTolerating PT: HHPT  DRUG ALLERGIES:   Allergies  Allergen Reactions  . Penicillins Rash and Other (See Comments)    Rash in and around the mouth Has patient had a PCN reaction causing immediate rash, facial/tongue/throat swelling, SOB or lightheadedness with hypotension: Yes Has patient had a PCN reaction causing severe rash involving mucus membranes or skin necrosis: Yes Has patient had a PCN reaction that required hospitalization: No Has patient had a PCN reaction occurring within the last 10 years: Yes If all of the above answers are "NO", then may proceed with Cephalosporin use.   . Bactrim [Sulfamethoxazole-Trimethoprim] Other (See Comments)    Mouth sores, Sores develop in mouth    VITALS:  Blood pressure 119/71, pulse 98, temperature 98.1 F (36.7 C), resp. rate 17, height 5\' 10"  (1.778 m), weight 82 kg, SpO2 94 %.  PHYSICAL EXAMINATION:    Physical Exam  GENERAL:  70 y.o.-year-old patient lying in the bed with no acute distress.  LUNGS: Normal breath sounds bilaterally, no wheezing, rales, rhonchi. No use of accessory muscles of respiration.  CARDIOVASCULAR: S1, S2 normal. No murmurs, rubs, or gallops.  ABDOMEN: Soft, nontender, nondistended. Bowel sounds present. No organomegaly or mass.  EXTREMITIES: left foot post op dressing and wound vac+ NEUROLOGIC: grossly nonfocal PSYCHIATRIC:  patient is alert and oriented x 3.  SKIN: as above  LABORATORY PANEL:  CBC Recent Labs  Lab 11/19/20 0424  WBC 4.9  HGB 11.2*  HCT 34.2*  PLT 176    Chemistries  Recent Labs  Lab 11/19/20 0424 11/20/20 0407 11/20/20 2328 11/22/20 0502  NA 133*  --   --   --   K 4.5  --   --   --   CL 99  --   --   --   CO2 27  --   --   --   GLUCOSE 213*  --   --   --   BUN 13  --   --   --   CREATININE 0.99   < > 0.92  --   CALCIUM 8.7*  --   --   --   MG 2.1   < > 2.1 2.3  AST 19  --   --   --   ALT 18  --   --   --   ALKPHOS 51  --   --   --   BILITOT 1.2  --   --   --    < > = values in this interval not displayed.   Cardiac  Enzymes No results for input(s): TROPONINI in the last 168 hours. RADIOLOGY:  [place] EKG SITE RITE  Result Date: 11/22/2020 If [facility] image not attached, placement could not be confirmed due to current cardiac rhythm.  ASSESSMENT AND PLAN:  70 year old with history of essential hypertension, nonischemic cardiomyopathy, DM2, peripheral neuropathy, PAD, history of tobacco use quit 1998 presents with shortness of breath, poor oral intake nausea, diarrhea with left lower extremity swelling with worsening diabetic foot infection.    Patient has been fully vaccinated and boosted against COVID-19.  Sepsis secondary to diabetic foot infection of his left lower extremity with ulceration and cellulitis, nonpurulent MSSA/Enterococcus bacteremia, persistent bacteremia -MRI foot-showing cellulitis and myositis but  no evidence of osteomyelitis -Antibiotics-IV daptomycin and vancomycin per ID -Repeat cultures from 2/22 remains negative.  PICC line will be placed when advised by ID -Podiatry-dressing changes, culture sent.  No surgical indication at this time.  Podiatry team aware of persistent bacteremia, they did not seem to think that is the source and repeat MRI foot will not change the plan.  -Vascular-s/p angiogram on 2/18 with PTA of left peroneal artery -Lower extremity Dopplers-negative for DVT -Echocardiogram-EF 60 to 65% -TEE 2/18-negative for endocarditis -MRI spine with contrast, CT abdomen pelvis with and without contrast negative for discitis/osteomyelitis and abdominal pathology to explain persistent bacterimia -- 2/24-- Dr Vickki Muff plans debridement with possible bone bx tomorrow --2/25--plans for debridedment today. Repeat BC from 2/24 pending --2/26 POD#1 left heel debridement. Wound VAC placed -- messaged ID Dr. Brooke Bonito-- okay to place PICC line -- no dressing change today per Dr. Luana Shu. Patient will continue with wound VAC at home with three times a week dressing changes. -- TOC informed to arrange wound VAC for home.   Peripheral arterial disease -Aspirin,Plavix and statin -Status post left lower extremity angio 2/21- status post balloon angioplasty left peroneal artery.  Chronic pain   -Pain control with bowel regimen  Shortness of breath with bilateral pleural effusion/atelectasis without hypoxia, improved -Out of bed to chair.  Incentive spirometer.  As needed bronchodilators liters -Echocardiogram -EF 60 to 65%, no evidence of endocarditis  Diabetes mellitus type 2, acceptable range -Peripheral neuropathy secondary to DM2 -Insulin sliding scale and Accu-Chek -Gabapentin 3 times daily  Hyperlipidemia Cont Statin   DVT prophylaxis: Lovenox Code Status: DNR Family Communication:  Bernice--step dter on the phone 2/26  Status is: Inpatient  Remains inpatient  appropriate because:Inpatient level of care appropriate due to severity of illness   Dispo: The patient is from: Home  Anticipated d/c is to: Home with HHPT  Anticipated d/c date JI:RCVELF March28th              Difficult to place patient No          TOTAL TIME TAKING CARE OF THIS PATIENT: 25 minutes.  >50% time spent on counselling and coordination of care  Note: This dictation was prepared with Dragon dictation along with smaller phrase technology. Any transcriptional errors that result from this process are unintentional.  Fritzi Mandes M.D    [facility]   CC: Primary care physician; [name], MDPatient ID: [name], male   DOB: 27-Jun-1951, 70 y.o.   MRN: [id number]

## 2020-11-22 NOTE — Progress Notes (Addendum)
Spoke with ID MD re PICC placement.  2nd set of [facility] pending.  States ok to place PICC 11/23/20.  No discharge plans for today.  Santiago Glad RN aware of plans to place PICC tomorrow, will notify Dr Posey Pronto as well, and states current PIV is working well.

## 2020-11-22 NOTE — Progress Notes (Signed)
Daily Progress Note   Subjective  - status post 1 day wound debridement with calcaneal bone biopsy and application of wound VAC  Patient resting in bedside chair comfortably.  Patient notes no pain to the left heel today.  Patient has had some difficulty with the wound VAC as the suction is not working as well.  Patient has not been putting weight on the foot since surgery.  Objective Vitals:   11/22/20 0418 11/22/20 0801 11/22/20 1204 11/22/20 1516  BP: 120/79 119/71 117/71 109/66  Pulse: (!) 103 98 (!) 106 (!) 105  Resp: 20 17 18 19   Temp: 98.1 F (36.7 C)  98.5 F (36.9 C) 98.5 F (36.9 C)  TempSrc:   Oral Oral  SpO2: 96% 94% 91% 100%  Weight:      Height:        Physical Exam: Nursing staff applying wound VAC upon entry to the room.  Wound covered by VAC at this time.  No acute active bleeding noted at this time.  Capillary fill time appears to be intact to digits overall.  Wound appears to be stable at this time to the left heel.  Laboratory CBC    Component Value Date/Time   WBC 4.9 11/19/2020 0424   HGB 11.2 (L) 11/19/2020 0424   HGB 15.2 04/17/2014 1026   HCT 34.2 (L) 11/19/2020 0424   HCT 44.6 04/17/2014 1026   PLT 176 11/19/2020 0424   PLT 127 (L) 04/17/2014 1026    BMET    Component Value Date/Time   NA 133 (L) 11/19/2020 0424   NA 136 04/17/2014 1026   K 4.5 11/19/2020 0424   K 5.1 04/17/2014 1026   CL 99 11/19/2020 0424   CL 103 04/17/2014 1026   CO2 27 11/19/2020 0424   CO2 28 04/17/2014 1026   GLUCOSE 213 (H) 11/19/2020 0424   GLUCOSE 124 (H) 04/17/2014 1026   BUN 13 11/19/2020 0424   BUN 28 (H) 04/17/2014 1026   CREATININE 0.92 11/20/2020 2328   CREATININE 1.30 04/17/2014 1026   CALCIUM 8.7 (L) 11/19/2020 0424   CALCIUM 8.6 04/17/2014 1026   GFRNONAA >60 11/20/2020 2328   GFRNONAA 59 (L) 04/17/2014 1026   GFRAA >60 06/20/2020 1001   GFRAA >60 04/17/2014 1026    Assessment/Planning: Positive bacteremia with Enterococcus Left heel  ulceration with necrosis Referral vascular disease   Patient seen today.  Wound VAC being applied by nursing staff upon entry into the room.  Wound appears to be stable at this time.  We will continue to follow cultures.  Continue VAC changes 3 times weekly.  If unable to obtain seal then recommend Betadine wet-to-dry dressing.  Apply Betadine soaked 4 x 4's to the wound followed by 4 x 4 gauze, ABD, Kerlix, Ace wrap.  If strikethrough or saturation occurs changed more frequently.  Patient to continue trying to stay off the left heel.  Patient may walk with toe-touch for transfers but otherwise needs to stay off left foot as much possible.  PT and OT have been ordered.  Appreciate medicine and infectious disease recommendations for antibiotic therapy.  Patient will need IV antibiotic therapy as outpatient due to persistent bacteremia.  So far no growth from bone culture.  Path specimen pending.  Podiatry team will monitor more peripherally at this time.  Discharge instructions placed in chart.  Patient can be discharged when deemed medically stable from medicine standpoint.   Caroline More, DPM  11/22/2020, 5:18 PM

## 2020-11-22 NOTE — Evaluation (Signed)
Physical Therapy Re-Evaluation Patient Details Name: [name] MRN: [id number] DOB: January 23, 1951 Today's Date: 11/22/2020   History of Present Illness  [name] is a 70 y.o. male with medical history significant for hypertension, nonischemic cardiomyopathy, non-insulin-dependent DM, peripheral neuropathy, and PAD, who presents to the ED following a visit with his PCP for SOB and swelling and pain in L LE. He endorses weakness and shortness of breath with exertion and increased sleeping and poor PO intake. He reports this has been ongoing for the last 2 weeks. He further endorses nausea and diarrhea x 2 weeks, with worsening the in last 2 days prompting evaluation at PCP. Pt reports unchanged baseline dry cough. He denies chest pain, abdominal pain, dysuria, hematuria. He endorses left lower extremity edema that has been ongoing for the last 2 weeks and worsening in the last 2 days. At PCP, he was advised to present to the ED due to his left lower extremity swelling and shortness of breath. Pt reports he was aware of the diabetic foot ulcer on the medial great toe, however he was unaware of the wound on the left heel. Pt underwent bone biopsy of L heel & I&D with placement of wound vac on L heel on 11/21/20. Pt is now NWB LLE.  Clinical Impression  Pt seen for PT revaluation with PT educating pt on NWB LLE & providing demonstration for mobility while maintaining precautions. Pt is able to ambulate bed>bathroom>recliner with RW & min assist while maintaining NWB LLE but activity a clear physical exertion for pt. Pt is able to complete toilet transfer with grab bar & CGA; pt has continent BM performing peri hygiene without assistance. Pt would benefit from ongoing acute PT services to progress gait, balance, and activity tolerance. Educated pt on need to continue performing LLE strengthening exercises.   Pt's wound vac intact but alarming throughout session - nursing staff made aware.   Pt's goals  remain appropriate at this time, with the additional goal of bed<>chair transfers.     Follow Up Recommendations Home health PT    Equipment Recommendations  Rolling walker with 5" wheels    Recommendations for Other Services       Precautions / Restrictions Precautions Precautions: Fall Restrictions Weight Bearing Restrictions: Yes LLE Weight Bearing: Non weight bearing      Mobility  Bed Mobility Overal bed mobility: Modified Independent             General bed mobility comments: supine>sit with bed rails    Transfers Overall transfer level: Needs assistance Equipment used: Rolling walker (2 wheeled) Transfers: Sit to/from Stand Sit to Stand: Min assist         General transfer comment: cuing for LE/UE placement to maintain NWB LLE during transfer  Ambulation/Gait Ambulation/Gait assistance: Min assist Gait Distance (Feet): 25 Feet (+ 25 ft) Assistive device: Rolling walker (2 wheeled) Gait Pattern/deviations: Step-to pattern;Decreased step length - right;Decreased stride length Gait velocity: decreased   General Gait Details: Cuing for safety during ambulation with pt experiencing 1 LOB when negotiating threshold to bathroom with min/mod assist to correct. Pt voicing gait while maintaining NWB LLE is very strenuous.  Stairs            Wheelchair Mobility    Modified Rankin (Stroke Patients Only)       Balance Overall balance assessment: Needs assistance Sitting-balance support: Feet supported Sitting balance-Leahy Scale: Good     Standing balance support: Bilateral upper extremity supported;During functional activity Standing balance-Leahy Scale:  Poor Standing balance comment: BUE support on RW during functional mobilit to maintain NWB LLE                             Pertinent Vitals/Pain Pain Assessment: No/denies pain    Home Living Family/patient expects to be discharged to:: Private residence Living Arrangements:  Spouse/significant other Available Help at Discharge: Family;Friend(s);Available PRN/intermittently Type of Home: Mobile home Home Access: Ramped entrance     Home Layout: One level Home Equipment: Cade - 2 wheels;Walker - 4 wheels;Bedside commode;Cane - single point;Cane - quad;Crutches;Wheelchair - power Additional Comments: indep prior, reports no falls. has equipment at home if needed    Prior Function Level of Independence: Independent               Hand Dominance        Extremity/Trunk Assessment   Upper Extremity Assessment Upper Extremity Assessment: Generalized weakness    Lower Extremity Assessment Lower Extremity Assessment: Generalized weakness LLE Deficits / Details: discoloration to distal LLE       Communication   Communication: No difficulties  Cognition Arousal/Alertness: Awake/alert Behavior During Therapy: WFL for tasks assessed/performed Overall Cognitive Status: Within Functional Limits for tasks assessed                                 General Comments: Pt is A and O x 4 and very cooperative. Tangential conversation intermittently during session.      General Comments General comments (skin integrity, edema, etc.): SPO2 88% or > during session on room air, HR 114 bpm    Exercises     Assessment/Plan    PT Assessment Patient needs continued PT services  PT Problem List Decreased balance;Decreased mobility;Decreased safety awareness;Decreased strength;Decreased knowledge of precautions;Decreased activity tolerance;Cardiopulmonary status limiting activity;Decreased knowledge of use of DME;Decreased skin integrity       PT Treatment Interventions DME instruction;Gait training;Balance training;Therapeutic exercise;Therapeutic activities;Modalities;Patient/family education;Stair training;Functional mobility training;Neuromuscular re-education;Manual techniques    PT Goals (Current goals can be found in the Care Plan section)   Acute Rehab PT Goals Patient Stated Goal: to go home PT Goal Formulation: With patient Time For Goal Achievement: 12/05/20 Potential to Achieve Goals: Good    Frequency Min 2X/week   Barriers to discharge Decreased caregiver support      Co-evaluation               AM-PAC PT "6 Clicks" Mobility  Outcome Measure Help needed turning from your back to your side while in a flat bed without using bedrails?: None Help needed moving from lying on your back to sitting on the side of a flat bed without using bedrails?: None Help needed moving to and from a bed to a chair (including a wheelchair)?: A Little Help needed standing up from a chair using your arms (e.g., wheelchair or bedside chair)?: A Little Help needed to walk in hospital room?: A Little Help needed climbing 3-5 steps with a railing? : Total 6 Click Score: 18    End of Session Equipment Utilized During Treatment: Other (comment);Gait belt (LLE surgical shoe) Activity Tolerance: Patient tolerated treatment well Patient left: in chair;with call bell/phone within reach;with chair alarm set (wound vac intact) Nurse Communication: Mobility status;Weight bearing status PT Visit Diagnosis: Unsteadiness on feet (R26.81);Difficulty in walking, not elsewhere classified (R26.2);Muscle weakness (generalized) (M62.81);Other abnormalities of gait and mobility (R26.89)  Time: 2010-0712 PT Time Calculation (min) (ACUTE ONLY): 32 min   Charges:   PT Evaluation $PT Eval Low Complexity: 1 Low PT Treatments $Therapeutic Activity: 8-22 mins        Lavone Nian, PT, DPT 11/22/20, 1:48 PM   Waunita Schooner 11/22/2020, 1:44 PM

## 2020-11-23 DIAGNOSIS — B952 Enterococcus as the cause of diseases classified elsewhere: Secondary | ICD-10-CM

## 2020-11-23 DIAGNOSIS — E11628 Type 2 diabetes mellitus with other skin complications: Secondary | ICD-10-CM | POA: Diagnosis not present

## 2020-11-23 DIAGNOSIS — E11621 Type 2 diabetes mellitus with foot ulcer: Secondary | ICD-10-CM | POA: Diagnosis not present

## 2020-11-23 DIAGNOSIS — R7881 Bacteremia: Secondary | ICD-10-CM

## 2020-11-23 DIAGNOSIS — B9561 Methicillin susceptible Staphylococcus aureus infection as the cause of diseases classified elsewhere: Secondary | ICD-10-CM

## 2020-11-23 DIAGNOSIS — I10 Essential (primary) hypertension: Secondary | ICD-10-CM | POA: Diagnosis not present

## 2020-11-23 LAB — CBC
HCT: 32 % — ABNORMAL LOW (ref 39.0–52.0)
Hemoglobin: 10.3 g/dL — ABNORMAL LOW (ref 13.0–17.0)
MCH: 27.8 pg (ref 26.0–34.0)
MCHC: 32.2 g/dL (ref 30.0–36.0)
MCV: 86.5 fL (ref 80.0–100.0)
Platelets: 146 10*3/uL — ABNORMAL LOW (ref 150–400)
RBC: 3.7 MIL/uL — ABNORMAL LOW (ref 4.22–5.81)
RDW: 14.3 % (ref 11.5–15.5)
WBC: 4.9 10*3/uL (ref 4.0–10.5)
nRBC: 0 % (ref 0.0–0.2)

## 2020-11-23 LAB — GLUCOSE, CAPILLARY
Glucose-Capillary: 127 mg/dL — ABNORMAL HIGH (ref 70–99)
Glucose-Capillary: 237 mg/dL — ABNORMAL HIGH (ref 70–99)
Glucose-Capillary: 231 mg/dL — ABNORMAL HIGH (ref 70–99)
Glucose-Capillary: 218 mg/dL — ABNORMAL HIGH (ref 70–99)

## 2020-11-23 LAB — CREATININE, SERUM
Creatinine, Ser: 0.92 mg/dL (ref 0.61–1.24)
GFR, Estimated: >60 mL/min (ref >60)

## 2020-11-23 LAB — CULTURE, BLOOD (ROUTINE X 2)
Culture: NO GROWTH
Culture: NO GROWTH
Special Requests: ADEQUATE
Special Requests: ADEQUATE

## 2020-11-23 MED ORDER — SODIUM CHLORIDE 0.9% FLUSH
10.0000 mL | INTRAVENOUS | Status: DC | PRN
Start: 2020-11-23 — End: 2020-11-25

## 2020-11-23 MED ORDER — SODIUM CHLORIDE 0.9% FLUSH
10.0000 mL | Freq: Two times a day (BID) | INTRAVENOUS | Status: DC
  Administered 2020-11-24: 10 mL

## 2020-11-23 NOTE — TOC Progression Note (Signed)
Transition of Care [place]) - Progression Note    Patient Details  Name: [name] MRN: [id number] Date of Birth: 01-Feb-1951  [facility] CM/SW Contact  Truitt Merle, [place] Phone Number: 11/23/2020, 4:56 PM  Clinical Narrative:    Received request from Dr. Posey Pronto to order wound vac. Spoke with patient via phone to confirm wound vac and ask for agency preference. Patient states he does not have a preference of DME agency. LCSW spoke with Zack at [employer] to order wound vac. Zack confirms wound vac to be delivered to the room on Monday. Updated Dr. Posey Pronto. HH with [facility] and infusion with Pam at [facility]. TOC continuing to follow for discharge needs.  Expected Discharge Plan: [place] Barriers to Discharge: Continued Medical Work up  Expected Discharge Plan and Services Expected Discharge Plan: [place]   Discharge Planning Services: CM Consult Post Acute Care Choice: [place] arrangements for the past 2 months: Single Family Home                           HH Arranged: RN,PT,OT,Nurse's Aide [place] ARB: [facility] Date [place]: 11/17/20       Social Determinants of Health (SDOH) Interventions    Readmission Risk Interventions No flowsheet data found.

## 2020-11-23 NOTE — Progress Notes (Signed)
[place] [facility] [place] NAME: [name]    MR#:  [id number]  DATE OF BIRTH:  June 07, 1951  SUBJECTIVE:  Denies any new complaints. Status post left heel debridement on Friday, February 25. Wound VAC placed REVIEW OF SYSTEMS:   Review of Systems  Constitutional: Negative for chills, fever and weight loss.  HENT: Negative for ear discharge, ear pain and nosebleeds.   Eyes: Negative for blurred vision, pain and discharge.  Respiratory: Negative for sputum production, shortness of breath, wheezing and stridor.   Cardiovascular: Negative for chest pain, palpitations, orthopnea and PND.  Gastrointestinal: Negative for abdominal pain, diarrhea, nausea and vomiting.  Genitourinary: Negative for frequency and urgency.  Musculoskeletal: Negative for back pain and joint pain.  Neurological: Positive for weakness. Negative for sensory change, speech change and focal weakness.  Psychiatric/Behavioral: Negative for depression and hallucinations. The patient is not nervous/anxious.    Tolerating Diet:yesTolerating PT: HHPT  DRUG ALLERGIES:   Allergies  Allergen Reactions  . Penicillins Rash and Other (See Comments)    Rash in and around the mouth Has patient had a PCN reaction causing immediate rash, facial/tongue/throat swelling, SOB or lightheadedness with hypotension: Yes Has patient had a PCN reaction causing severe rash involving mucus membranes or skin necrosis: Yes Has patient had a PCN reaction that required hospitalization: No Has patient had a PCN reaction occurring within the last 10 years: Yes If all of the above answers are "NO", then may proceed with Cephalosporin use.   . Bactrim [Sulfamethoxazole-Trimethoprim] Other (See Comments)    Mouth sores, Sores develop in mouth    VITALS:  Blood pressure 104/63, pulse 99, temperature 97.8 F (36.6 C), temperature source Oral, resp. rate 18, height 5\' 10"  (1.778 m), weight 82 kg, SpO2 93  %.  PHYSICAL EXAMINATION:   Physical Exam  GENERAL:  70 y.o.-year-old patient lying in the bed with no acute distress.  LUNGS: Normal breath sounds bilaterally, no wheezing, rales, rhonchi. No use of accessory muscles of respiration.  CARDIOVASCULAR: S1, S2 normal. No murmurs, rubs, or gallops.  ABDOMEN: Soft, nontender, nondistended. Bowel sounds present. No organomegaly or mass.  EXTREMITIES: left foot post op dressing and wound vac+ NEUROLOGIC: grossly nonfocal PSYCHIATRIC:  patient is alert and oriented x 3.  SKIN: as above  LABORATORY PANEL:  CBC Recent Labs  Lab 11/23/20 0502  WBC 4.9  HGB 10.3*  HCT 32.0*  PLT 146*    Chemistries  Recent Labs  Lab 11/19/20 0424 11/20/20 0407 11/22/20 0502 11/23/20 0502  NA 133*  --   --   --   K 4.5  --   --   --   CL 99  --   --   --   CO2 27  --   --   --   GLUCOSE 213*  --   --   --   BUN 13  --   --   --   CREATININE 0.99   < >  --  0.92  CALCIUM 8.7*  --   --   --   MG 2.1   < > 2.3  --   AST 19  --   --   --   ALT 18  --   --   --   ALKPHOS 51  --   --   --   BILITOT 1.2  --   --   --    < > = values in this interval  not displayed.   Cardiac Enzymes No results for input(s): TROPONINI in the last 168 hours. RADIOLOGY:  [place] EKG SITE RITE  Result Date: 11/22/2020 If [facility] image not attached, placement could not be confirmed due to current cardiac rhythm.  ASSESSMENT AND PLAN:  70 year old with history of essential hypertension, nonischemic cardiomyopathy, DM2, peripheral neuropathy, PAD, history of tobacco use quit 1998 presents with shortness of breath, poor oral intake nausea, diarrhea with left lower extremity swelling with worsening diabetic foot infection.    Patient has been fully vaccinated and boosted against COVID-19.  Sepsis secondary to diabetic foot infection of his left lower extremity with ulceration and cellulitis, nonpurulent MSSA/Enterococcus bacteremia, persistent bacteremia -MRI  foot-showing cellulitis and myositis but no evidence of osteomyelitis -Antibiotics-IV daptomycin and vancomycin per ID -Repeat cultures from 2/22 remains negative.  PICC line will be placed when advised by ID -Podiatry-dressing changes, culture sent.  No surgical indication at this time.  Podiatry team aware of persistent bacteremia, they did not seem to think that is the source and repeat MRI foot will not change the plan.  -Vascular-s/p angiogram on 2/18 with PTA of left peroneal artery -Lower extremity Dopplers-negative for DVT -Echocardiogram-EF 60 to 65% -TEE 2/18-negative for endocarditis -MRI spine with contrast, CT abdomen pelvis with and without contrast negative for discitis/osteomyelitis and abdominal pathology to explain persistent bacterimia -- 2/24-- Dr Vickki Muff plans debridement with possible bone bx tomorrow --2/25--plans for debridedment today. Repeat BC from 2/24 pending --2/26 POD#1 left heel debridement. Wound VAC placed -- messaged ID Dr. Brooke Bonito-- okay to place PICC line -- no dressing change today per Dr. Luana Shu. Patient will continue with wound VAC at home with three times a week dressing changes. -- TOC informed to arrange wound VAC for home. --2/27--did work with PT yday.   Peripheral arterial disease -Aspirin,Plavix and statin -Status post left lower extremity angio 2/21- status post balloon angioplasty left peroneal artery.  Chronic pain   -Pain control with bowel regimen  Shortness of breath with bilateral pleural effusion/atelectasis without hypoxia, improved -Out of bed to chair.  Incentive spirometer.  As needed bronchodilators liters -Echocardiogram -EF 60 to 65%, no evidence of endocarditis  Diabetes mellitus type 2, acceptable range -Peripheral neuropathy secondary to DM2 -Insulin sliding scale and Accu-Chek -Gabapentin 3 times daily  Hyperlipidemia Cont Statin   DVT prophylaxis: Lovenox Code Status: DNR Family Communication:  Bernice--step  dter on the phone 2/26  Status is: Inpatient  Remains inpatient appropriate because:Inpatient level of care appropriate due to severity of illness   Dispo: The patient is from: Home  Anticipated d/c is to: Home with HHPT  Anticipated d/c date WU:[id number] March28th              Difficult to place patient No  TOC messaged today again for arranging wound vac for home.        TOTAL TIME TAKING CARE OF THIS PATIENT: 25 minutes.  >50% time spent on counselling and coordination of care  Note: This dictation was prepared with Dragon dictation along with smaller phrase technology. Any transcriptional errors that result from this process are unintentional.  Fritzi Mandes M.D    [facility]   CC: Primary care physician; [name], MDPatient ID: [name], male   DOB: [birth year], 70 y.o.   MRN: [id number]

## 2020-11-23 NOTE — Progress Notes (Signed)
Spoke with Annabella RN re plan to place PICC this pm. No d/c plans for today.

## 2020-11-23 NOTE — Progress Notes (Signed)
Daily Progress Note   Subjective  - status post 2 days wound debridement with calcaneal bone biopsy and application of wound VAC  Patient resting in bedside chair comfortably.  Patient notes no pain to the left heel today.  Patient is wondering if he could potentially use a knee scooter to stay off of the left foot as he is having trouble working with PT and just using the walker getting around.  Patient had no overnight events and states that the wound VAC has been on stable since yesterday.  Objective Vitals:   11/23/20 0438 11/23/20 0729 11/23/20 0854 11/23/20 1205  BP: 114/68 104/63  129/83  Pulse: 100 99  99  Resp: 18  20 18   Temp: 98.3 F (36.8 C) 97.8 F (36.6 C)  98.9 F (37.2 C)  TempSrc:  Oral    SpO2: 95% 93%  91%  Weight:      Height:        Physical Exam: Left heel wound covered by VAC at this time.  No acute active bleeding noted at this time.  Capillary fill time appears to be intact to digits overall.  No other open wounds noted at this time.  Laboratory CBC    Component Value Date/Time   WBC 4.9 11/23/2020 0502   HGB 10.3 (L) 11/23/2020 0502   HGB 15.2 04/17/2014 1026   HCT 32.0 (L) 11/23/2020 0502   HCT 44.6 04/17/2014 1026   PLT 146 (L) 11/23/2020 0502   PLT 127 (L) 04/17/2014 1026    BMET    Component Value Date/Time   NA 133 (L) 11/19/2020 0424   NA 136 04/17/2014 1026   K 4.5 11/19/2020 0424   K 5.1 04/17/2014 1026   CL 99 11/19/2020 0424   CL 103 04/17/2014 1026   CO2 27 11/19/2020 0424   CO2 28 04/17/2014 1026   GLUCOSE 213 (H) 11/19/2020 0424   GLUCOSE 124 (H) 04/17/2014 1026   BUN 13 11/19/2020 0424   BUN 28 (H) 04/17/2014 1026   CREATININE 0.92 11/23/2020 0502   CREATININE 1.30 04/17/2014 1026   CALCIUM 8.7 (L) 11/19/2020 0424   CALCIUM 8.6 04/17/2014 1026   GFRNONAA >60 11/23/2020 0502   GFRNONAA 59 (L) 04/17/2014 1026   GFRAA >60 06/20/2020 1001   GFRAA >60 04/17/2014 1026    Assessment/Planning: Positive bacteremia with  Enterococcus Left heel ulceration with necrosis Referral vascular disease   Patient seen today.  Wound VAC appears to be stable and in place today with excellent suction noted.  Wound appears to be stable at this time.  Blood cultures with no growth to date for the past few days.  Bone culture from calcaneus has no growth as well intraoperatively so far.  Previous wound culture grew MSSA and Enterococcus.  Continue VAC changes 3 times weekly.  If unable to obtain seal then recommend Betadine wet-to-dry dressing.  Apply Betadine soaked 4 x 4's to the wound followed by 4 x 4 gauze, ABD, Kerlix, Ace wrap.  If strikethrough or saturation occurs changed more frequently.  Patient to continue trying to stay off the left heel.  Patient may walk with toe-touch for transfers but otherwise needs to stay off left foot as much possible.  PT and OT have been ordered.  Sent message to PT potentially about working with patient on left knee scooter.  Appreciate medicine and infectious disease recommendations for antibiotic therapy.  Patient will need IV antibiotic therapy as outpatient due to persistent bacteremia.  So far  no growth from bone culture.  Path specimen pending.  Podiatry team to sign off at this point.  Reconsult if any further problems arise.  Discharge instructions placed in chart.   Caroline More, DPM  11/23/2020, 2:45 PM

## 2020-11-23 NOTE — Progress Notes (Signed)
Secure chat sent to RN to remove PIV and change IV tubing. PICC ready to use.

## 2020-11-23 NOTE — Progress Notes (Signed)
Peripherally Inserted Central Catheter Placement  The IV Nurse has discussed with the patient and/or persons authorized to consent for the patient, the purpose of this procedure and the potential benefits and risks involved with this procedure.  The benefits include less needle sticks, lab draws from the catheter, and the patient may be discharged home with the catheter. Risks include, but not limited to, infection, bleeding, blood clot (thrombus formation), and puncture of an artery; nerve damage and irregular heartbeat and possibility to perform a PICC exchange if needed/ordered by physician.  Alternatives to this procedure were also discussed.  Bard Power PICC patient education guide, fact sheet on infection prevention and patient information card has been provided to patient /or left at bedside.    PICC Placement Documentation  PICC Single Lumen 06/19/29 Right Basilic 40 cm 0 cm (Active)  Indication for Insertion or Continuance of Line Home intravenous therapies (PICC only) 11/23/20 1724  Exposed Catheter (cm) 0 cm 11/23/20 1724  Site Assessment Clean;Dry;Intact 11/23/20 1724  Line Status Flushed;Saline locked;Blood return noted 11/23/20 1724  Dressing Type Transparent 11/23/20 1724  Dressing Status Clean;Dry;Intact 11/23/20 1724  Antimicrobial disc in place? Yes 11/23/20 1724  Safety Lock Not Applicable 07/62/26 3335  Line Care Connections checked and tightened 11/23/20 1724  Line Adjustment (NICU/IV Team Only) No 11/23/20 1724  Dressing Intervention New dressing 11/23/20 1724  Dressing Change Due 11/30/20 11/23/20 1724       Rolena Infante 11/23/2020, 5:26 PM

## 2020-11-24 DIAGNOSIS — E11628 Type 2 diabetes mellitus with other skin complications: Secondary | ICD-10-CM | POA: Diagnosis not present

## 2020-11-24 DIAGNOSIS — B952 Enterococcus as the cause of diseases classified elsewhere: Secondary | ICD-10-CM | POA: Diagnosis not present

## 2020-11-24 DIAGNOSIS — R7881 Bacteremia: Secondary | ICD-10-CM | POA: Diagnosis not present

## 2020-11-24 DIAGNOSIS — E1140 Type 2 diabetes mellitus with diabetic neuropathy, unspecified: Secondary | ICD-10-CM

## 2020-11-24 DIAGNOSIS — E11621 Type 2 diabetes mellitus with foot ulcer: Secondary | ICD-10-CM | POA: Diagnosis not present

## 2020-11-24 DIAGNOSIS — L97423 Non-pressure chronic ulcer of left heel and midfoot with necrosis of muscle: Secondary | ICD-10-CM | POA: Diagnosis not present

## 2020-11-24 DIAGNOSIS — A419 Sepsis, unspecified organism: Secondary | ICD-10-CM | POA: Diagnosis not present

## 2020-11-24 LAB — GLUCOSE, CAPILLARY
Glucose-Capillary: 152 mg/dL — ABNORMAL HIGH (ref 70–99)
Glucose-Capillary: 172 mg/dL — ABNORMAL HIGH (ref 70–99)
Glucose-Capillary: 233 mg/dL — ABNORMAL HIGH (ref 70–99)

## 2020-11-24 LAB — SURGICAL PATHOLOGY

## 2020-11-24 LAB — VANCOMYCIN, TROUGH: Vancomycin Tr: 17 ug/mL (ref 15–20)

## 2020-11-24 MED ORDER — SODIUM CHLORIDE 0.9 % IV SOLN
2.0000 g | Freq: Three times a day (TID) | INTRAVENOUS | Status: DC
  Administered 2020-11-24: 2 g via INTRAVENOUS
  Filled 2020-11-24 (×3): qty 2

## 2020-11-24 MED ORDER — CIPROFLOXACIN HCL 500 MG PO TABS: 500.0000 mg | ORAL_TABLET | Freq: Two times a day (BID) | ORAL | Status: DC

## 2020-11-24 MED ORDER — CIPROFLOXACIN HCL 500 MG PO TABS: 500.0000 mg | ORAL_TABLET | Freq: Two times a day (BID) | ORAL | 0 refills | Status: AC

## 2020-11-24 MED ORDER — VANCOMYCIN IV (FOR PTA / DISCHARGE USE ONLY): 1000.0000 mg | Freq: Two times a day (BID) | INTRAVENOUS | 0 refills | Status: DC

## 2020-11-24 NOTE — Discharge Instructions (Signed)
Keep log of sugars at home

## 2020-11-24 NOTE — TOC Transition Note (Signed)
Transition of Care [facility]) - CM/SW Discharge Note   Patient Details  Name: AUSENCIO VADEN MRN: [id number] Date of Birth: Mar 26, 1951  Transition of Care [facility]) CM/SW Contact:  Candie Chroman, LCSW Phone Number: 11/24/2020, 12:08 PM   Clinical Narrative:   Patient has orders to discharge home today. [employer], [facility], and [facility] representatives are aware. [employer] has vac order form signed. [facility] representative will be here around 12:00. No further concerns. Patient can discharge once education provided for infusions and vac is delivered to the room. No further concerns. CSW signing off.  Final next level of care: [place] Barriers to Discharge: Barriers Resolved   Patient Goals and CMS Choice     Choice offered to / list presented to : Patient  Discharge Placement                    Patient and family notified of of transfer: 11/24/20  Discharge Plan and Services   Discharge Planning Services: CM Consult Post Acute Care Choice: Home Health          DME Arranged: Vac DME Agency: [employer] Date DME Agency Contacted: 11/24/20   Representative spoke with at DME Agency: Andree Coss and Nash Shearer [facility] Arranged: RN,PT,OT,Nurse's [place] Agency: [facility],Other - See comment ([facility]) Date [facility] Agency Contacted: 11/24/20   Representative spoke with at [place]: [name]: Jana Half, [facility]: Carolynn Sayers  Social Determinants of Health (SDOH) Interventions     Readmission Risk Interventions No flowsheet data found.

## 2020-11-24 NOTE — Progress Notes (Signed)
Pharmacy Antibiotic Note  [name] is a 70 y.o. male admitted on 11/11/2020 with bacteremia. Pt presented with L foot wounds, s/p debridement on 2/16. Pt has a hx of toe amputations. Pt has an allergy to penicillins with reaction of severe mouth ulcers occurring with both penicillin and ampicillin. 2/15 blood cultures positive for enterococcus faecalis and MSSA. 2/16 wound culture with MSSA, e. Faecalis, and prevotella bivia. Repeat blood cultures from 2/16, 2/19, and 2/20 with e. faecalis. BCx from 2/22 and 2/24 NGTD. TEE on 2/18 negative for vegetation. No concerns for septic arthritis per MRI. No concern for osteo on foot XR or MRI. LLE angiogram completed 2/21. PICC placed 2/27 for IV abx x 6 weeks. Pt underwent debridement of wound and bone biopsy on 2/25 which grew pseudomonas and staph aureus. Pt was on daptomycin transitioned 2/21 to vancomycin due to uncontrolled infection with repeat positive blood cultures.  Pharmacy has been consulted for vancomycin dosing.   Day 7 vanc  Day 6 metronidazole Day 1 ciprofloxacin  2/23 vanc pk @ 1222: 39, vanc tr @ 2133: 19 - dose changed from 1250 mg q12h to 1000 mg q12h  2/28 vanc tr @ 1039: 17  Plan: Continue Metronidazole 500 mg TID  Continue ciprofloxacin 500 mg BID x 2 weeks Continue Vancomycin IV 1g Q12 hours x 6 weeks --VT 17, goal 15-20 --Expected AUC: 561.3 --SCr used: 0.99 --Scr at least every other day while on vancomycin  Height: 5\' 10"  (177.8 cm) Weight: 82 kg (180 lb 12.4 oz) IBW/kg (Calculated) : 73  Temp (24hrs), Avg:98.2 F (36.8 C), Min:97.6 F (36.4 C), Max:98.9 F (37.2 C)  Recent Labs  Lab 11/18/20 0003 11/19/20 0424 11/19/20 1222 11/19/20 2133 11/20/20 0407 11/20/20 2328 11/23/20 0502 11/24/20 1039  WBC 5.4 4.9  --   --   --   --  4.9  --   CREATININE 0.86 0.99  --   --  1.01 0.92 0.92  --   VANCOTROUGH  --   --   --  19  --   --   --  17  VANCOPEAK  --   --  39  --   --   --   --   --     Estimated  Creatinine Clearance: 80.4 mL/min (by C-G formula based on SCr of 0.92 mg/dL).    Allergies  Allergen Reactions  . Penicillins Rash and Other (See Comments)    Rash in and around the mouth Has patient had a PCN reaction causing immediate rash, facial/tongue/throat swelling, SOB or lightheadedness with hypotension: Yes Has patient had a PCN reaction causing severe rash involving mucus membranes or skin necrosis: Yes Has patient had a PCN reaction that required hospitalization: No Has patient had a PCN reaction occurring within the last 10 years: Yes If all of the above answers are "NO", then may proceed with Cephalosporin use.   . Bactrim [Sulfamethoxazole-Trimethoprim] Other (See Comments)    Mouth sores, Sores develop in mouth    Antimicrobials this admission: 2/15 Cefepime >> 2/16 2/15 Metronidazole x 1, 2/22 >> 2/15 Vancomycin >> 2/17, 2/21 >> 2/17 Daptomycin >> 2/22 2/28 ciprofloxacin >>  Microbiology results: 2/15 BCx: E faecalis, MSSA 2/16 BCx: E faecalis 2/16 WCx: MSSA, E faecalis, Prevotella bivia 2/18 BCx: NGTD 2/19 BCx: E faecalis 2/20 BCx: E faecalis 2/22 BCx: NGTD 2/24 BCx: NGTD 2/25 bone cx: pseudomonas and staph aureus  Thank you for allowing pharmacy to be a part of this patient's care.  Benn Moulder, PharmD Pharmacy Resident  11/24/2020 12:45 PM

## 2020-11-24 NOTE — Discharge Summary (Signed)
[facility] NAME: [name]    MR#:  [id number]  DATE OF BIRTH:  27-Nov-1950  DATE OF ADMISSION:  11/11/2020 ADMITTING PHYSICIAN: Amy N Cox, DO  DATE OF DISCHARGE: 11/24/2020  PRIMARY CARE PHYSICIAN: [name], MD    ADMISSION DIAGNOSIS:  Diabetic foot infection ([place]) [K93.267, L08.9] Sepsis ([place]) [A41.9] Acute on chronic congestive heart failure, unspecified heart failure type ([place]) [I50.9]  DISCHARGE DIAGNOSIS:  sepsis secondary to diabetic foot infection left lower extremity present on admission resolved MSSA/enterococcus bacteremia peripheral arterial disease status post angiogram/plasty left peroneal artery type II diabetes with peripheral neuropathy hyperlipidemia   SECONDARY DIAGNOSIS:   Past Medical History:  Diagnosis Date  . Cholecystitis, chronic   . Cholelithiasis   . COPD (chronic obstructive pulmonary disease) ([place])   . Diabetes mellitus without complication ([place])   . Diabetic retinopathy ([place])   . Dyspnea   . Fatty liver 2008  . Fracture of clavicle 1992   left   . Fracture of ribs, multiple 1992   3 ribs  . GERD (gastroesophageal reflux disease)   . Hyperlipidemia   . Hypertension   . Paroxysmal SVT (supraventricular tachycardia) ([place])   . Pelvis fracture ([place]) 1992  . Peripheral vascular disease ([place])   . Pleurisy   . Pleurisy   . Seasonal allergies     HOSPITAL COURSE:   70 year old with history of essential hypertension, nonischemic cardiomyopathy, DM2, peripheral neuropathy, PAD, history of tobacco use quit 1998 presents with shortness of breath, poor oral intake nausea, diarrhea with left lower extremity swelling with worsening diabetic foot infection.   Patient has been fully vaccinated and boosted against COVID-19.  Sepsis secondary to diabetic foot infection of his left lower extremity with ulceration and cellulitis, nonpurulent MSSA/Enterococcus bacteremia,persistent  bacteremia -MRI foot-showing cellulitis and myositis but no evidence of osteomyelitis -Antibiotics-IV daptomycin and vancomycin per ID -Repeat cultures from 2/22 remains negative. PICC line will be placed when advised by ID -Podiatry-dressing changes, culture sent. No surgical indication at this time. Podiatry team aware of persistent bacteremia, they did not seem to think that is the source and repeat MRI foot will not change the plan.  -Vascular-s/p angiogram on 2/18 with PTA of left peroneal artery -Lower extremity Dopplers-negative for DVT -Echocardiogram-EF 60 to 65% -TEE 2/18-negative for endocarditis -MRI spine with contrast, CT abdomen pelvis with and without contrast negative for discitis/osteomyelitis and abdominal pathology to explain persistent bacterimia -- 2/24-- Dr Vickki Muff plans debridement with possible bone bx tomorrow --2/25--plans for debridedment today. Repeat BC from 2/24 negative --2/26 POD#1 left heel debridement. Wound VAC placed -- messaged ID Dr. Brooke Bonito-- okay to place PICC line -- no dressing change today per Dr. Luana Shu. Patient will continue with wound VAC at home with three times a week dressing changes. -- TOC informed to arrange wound VAC for home. --2/27--did work with PT yday. --2/28--doing well. Wound vac changed -- patient will discharge on IV vancomycin and oral ciprofloxacin--per ID Dr Brooke Bonito   Peripheral arterial disease -Aspirin,Plavix and statin -Status post left lower extremity angio 2/21- status post balloon angioplasty left peroneal artery.  Chronic pain  -Pain control with bowel regimen  Shortness of breath with bilateral pleural effusion/atelectasis without hypoxia, improved -Out of bed to chair. Incentive spirometer. As needed bronchodilators liters -Echocardiogram -EF 60 to 65%, no evidence of endocarditis  Diabetes mellitus type 2,acceptable range -Peripheral neuropathy secondary to DM2 -- resume glipizide at discharge -Insulin  sliding scale and  Accu-Chek -Gabapentin 3 times daily  Hyperlipidemia Cont Statin   DVT prophylaxis:Lovenox Code Status:DNR Family Communication:Bernice--step dter aware of patient's discharge plan  Status is: Inpatient   Dispo: The patient is from: Home Anticipated d/c is to: Home with HHPT Anticipated d/c date VO:HYWVPX March28th Difficult to place patient No   discharged home today with outpatient home health PT RN, ID, podiatry, vascular surgery follow-up CONSULTS OBTAINED:  Treatment Team:  Caroline More, DPM  DRUG ALLERGIES:   Allergies  Allergen Reactions  . Penicillins Rash and Other (See Comments)    Rash in and around the mouth Has patient had a PCN reaction causing immediate rash, facial/tongue/throat swelling, SOB or lightheadedness with hypotension: Yes Has patient had a PCN reaction causing severe rash involving mucus membranes or skin necrosis: Yes Has patient had a PCN reaction that required hospitalization: No Has patient had a PCN reaction occurring within the last 10 years: Yes If all of the above answers are "NO", then may proceed with Cephalosporin use.   . Bactrim [Sulfamethoxazole-Trimethoprim] Other (See Comments)    Mouth sores, Sores develop in mouth    DISCHARGE MEDICATIONS:   Allergies as of 11/24/2020      Reactions   Penicillins Rash, Other (See Comments)   Rash in and around the mouth Has patient had a PCN reaction causing immediate rash, facial/tongue/throat swelling, SOB or lightheadedness with hypotension: Yes Has patient had a PCN reaction causing severe rash involving mucus membranes or skin necrosis: Yes Has patient had a PCN reaction that required hospitalization: No Has patient had a PCN reaction occurring within the last 10 years: Yes If all of the above answers are "NO", then may proceed with Cephalosporin use.   Bactrim [sulfamethoxazole-trimethoprim] Other (See  Comments)   Mouth sores, Sores develop in mouth      Medication List    STOP taking these medications   Pneumovax 23 25 MCG/0.5ML injection Generic drug: pneumococcal 23 valent vaccine     TAKE these medications   Accu-Chek Guide test strip Generic drug: glucose blood Use as instructed   accu-chek soft touch lancets Use as instructed   aspirin EC 81 MG tablet Take 81 mg by mouth daily.   CALCIUM PLUS VITAMIN D PO Take 1 tablet by mouth daily.   ciprofloxacin 500 MG tablet Commonly known as: CIPRO Take 1 tablet (500 mg total) by mouth 2 (two) times daily for 14 days.   clopidogrel 75 MG tablet Commonly known as: PLAVIX TAKE 1 TABLET BY MOUTH ONCE DAILY   gabapentin 300 MG capsule Commonly known as: NEURONTIN Take 1 capsule (300 mg total) by mouth 3 (three) times daily.   glyBURIDE 5 MG tablet Commonly known as: DIABETA Take 1 tablet (5 mg total) by mouth 2 (two) times daily with a meal.   lovastatin 10 MG tablet Commonly known as: MEVACOR Take 1 tab  Po QHS   meloxicam 15 MG tablet Commonly known as: MOBIC Take by mouth.   mometasone-formoterol 100-5 MCG/ACT Aero Commonly known as: DULERA Inhale 2 puffs into the lungs 2 (two) times daily as needed for wheezing.   ondansetron 4 MG disintegrating tablet Commonly known as: ZOFRAN-ODT Take 1 tablet (4 mg total) by mouth every 8 (eight) hours as needed for nausea or vomiting.   oxybutynin 5 MG tablet Commonly known as: DITROPAN Take 1 tablet (5 mg total) by mouth 2 (two) times daily.   traMADol 50 MG tablet Commonly known as: ULTRAM TAKE 1 TABLET BY MOUTH EVERY 12  HOURS ASNEEDED FOR PAIN   vancomycin  IVPB Inject 1,000 mg into the vein every 12 (twelve) hours. Indication:  Enterococcus and MSSA bacteremia, Left foot infection First Dose: Yes Last Day of Therapy:  12/28/20 Labs - Monday:  CBC/D, CMP, and vancomycin trough. Labs - Thursday:  BMP and vancomycin trough Remove PIC line with completion of  antibiotics Method of administration:Elastomeric Method of administration may be changed at the discretion of the patient and/or caregiver's ability to self-administer the medication ordered.            Discharge Care Instructions  (From admission, onward)         Start     Ordered   11/24/20 0000  Change dressing on IV access line weekly and PRN  (Home infusion instructions - [facility] )        11/24/20 0931   11/24/20 0000  Discharge wound care:       Comments: PER Dr Luana Shu Podiatry:  Continue VAC changes 3 times weekly.  If unable to obtain seal then recommend Betadine wet-to-dry dressing.  Apply Betadine soaked 4 x 4's to the wound followed by 4 x 4 gauze, ABD, Kerlix, Ace wrap.  If strikethrough or saturation occurs changed more frequently.  Patient to continue trying to stay off the left heel.  Patient may walk with toe-touch for transfers but otherwise needs to stay off left foot as much possible   11/24/20 0931          If you experience worsening of your admission symptoms, develop shortness of breath, life threatening emergency, suicidal or homicidal thoughts you must seek medical attention immediately by calling 911 or calling your MD immediately  if symptoms less severe.  You Must read complete instructions/literature along with all the possible adverse reactions/side effects for all the Medicines you take and that have been prescribed to you. Take any new Medicines after you have completely understood and accept all the possible adverse reactions/side effects.   Please note  You were cared for by a hospitalist during your hospital stay. If you have any questions about your discharge medications or the care you received while you were in the hospital after you are discharged, you can call the unit and asked to speak with the hospitalist on call if the hospitalist that took care of you is not available. Once you are discharged, your primary care physician  will handle any further medical issues. Please note that NO REFILLS for any discharge medications will be authorized once you are discharged, as it is imperative that you return to your primary care physician (or establish a relationship with a primary care physician if you do not have one) for your aftercare needs so that they can reassess your need for medications and monitor your lab values. Today   SUBJECTIVE   Doing well  VITAL SIGNS:  Blood pressure (!) 104/55, pulse (!) 101, temperature 97.6 F (36.4 C), temperature source Oral, resp. rate 18, height 5\' 10"  (1.778 m), weight 82 kg, SpO2 100 %.  I/O:    Intake/Output Summary (Last 24 hours) at 11/24/2020 1117 Last data filed at 11/24/2020 1009 Gross per 24 hour  Intake 820 ml  Output 1701 ml  Net -881 ml    PHYSICAL EXAMINATION:  GENERAL:  70 y.o.-year-old patient lying in the bed with no acute distress.  LUNGS: Normal breath sounds bilaterally, no wheezing, rales, rhonchi. No use of accessory muscles of respiration.  CARDIOVASCULAR: S1, S2 normal. No murmurs,  rubs, or gallops.  ABDOMEN: Soft, nontender, nondistended. Bowel sounds present. No organomegaly or mass.  EXTREMITIES: left foot post op dressing and wound vac+ NEUROLOGIC: grossly nonfocal PSYCHIATRIC:  patient is alert and oriented x 3.  SKIN:          DATA REVIEW:   CBC  Recent Labs  Lab 11/23/20 0502  WBC 4.9  HGB 10.3*  HCT 32.0*  PLT 146*    Chemistries  Recent Labs  Lab 11/19/20 0424 11/20/20 0407 11/22/20 0502 11/23/20 0502  NA 133*  --   --   --   K 4.5  --   --   --   CL 99  --   --   --   CO2 27  --   --   --   GLUCOSE 213*  --   --   --   BUN 13  --   --   --   CREATININE 0.99   < >  --  0.92  CALCIUM 8.7*  --   --   --   MG 2.1   < > 2.3  --   AST 19  --   --   --   ALT 18  --   --   --   ALKPHOS 51  --   --   --   BILITOT 1.2  --   --   --    < > = values in this interval not displayed.    Microbiology Results    Recent Results (from the past 240 hour(s))  Culture, blood (Routine X 2) w Reflex to ID Panel     Status: Abnormal   Collection Time: 11/15/20  4:17 AM   Specimen: BLOOD  Result Value Ref Range Status   Specimen Description   Final    BLOOD BLOOD RIGHT HAND Performed at [facility], [address], [place], [place] [address]    Special Requests   Final    BOTTLES DRAWN AEROBIC AND ANAEROBIC Blood Culture adequate volume Performed at [facility], [address] [place], [place] [address]    Culture  Setup Time   Final    ANAEROBIC BOTTLE ONLY GRAM POSITIVE COCCI CRITICAL VALUE NOTED.  VALUE IS CONSISTENT WITH PREVIOUSLY REPORTED AND CALLED VALUE. Performed at [facility], [address], [place], [place] [address]    Culture (A)  Final    ENTEROCOCCUS FAECALIS SUSCEPTIBILITIES PERFORMED ON PREVIOUS CULTURE WITHIN THE LAST 5 DAYS. Performed at [facility], [place] [address] [place], [place] [address]    Report Status 11/18/2020 FINAL  Final  Culture, blood (Routine X 2) w Reflex to ID Panel     Status: Abnormal   Collection Time: 11/15/20  4:18 AM   Specimen: BLOOD  Result Value Ref Range Status   Specimen Description   Final    BLOOD BLOOD LEFT HAND Performed at [facility], [address], [place], [place] [address]    Special Requests   Final    BOTTLES DRAWN AEROBIC AND ANAEROBIC Blood Culture adequate volume Performed at [facility], [address] [place], [place] [address]    Culture  Setup Time   Final    IN BOTH AEROBIC AND ANAEROBIC BOTTLES GRAM POSITIVE COCCI CRITICAL RESULT CALLED TO, READ BACK BY AND VERIFIED WITHOneita Kras [facility] AT 2235 11/15/20 MF Performed at [facility], [place] [address] [place],  [address]    Culture ENTEROCOCCUS FAECALIS (  A)  Final   Report Status 11/18/2020 FINAL  Final   Organism ID, Bacteria ENTEROCOCCUS FAECALIS  Final      Susceptibility   Enterococcus  faecalis - MIC*    AMPICILLIN <=2 SENSITIVE Sensitive     VANCOMYCIN 1 SENSITIVE Sensitive     GENTAMICIN SYNERGY SENSITIVE Sensitive     * ENTEROCOCCUS FAECALIS  Blood Culture ID Panel (Reflexed)     Status: Abnormal   Collection Time: 11/15/20  4:18 AM  Result Value Ref Range Status   Enterococcus faecalis DETECTED (A) NOT DETECTED Final    Comment: CRITICAL RESULT CALLED TO, READ BACK BY AND VERIFIED WITH: KARISSA [facility] AT 2235 11/15/20 MF.    Enterococcus Faecium NOT DETECTED NOT DETECTED Final   Listeria monocytogenes NOT DETECTED NOT DETECTED Final   Staphylococcus species NOT DETECTED NOT DETECTED Final   Staphylococcus aureus (BCID) NOT DETECTED NOT DETECTED Final   Staphylococcus epidermidis NOT DETECTED NOT DETECTED Final   Staphylococcus lugdunensis NOT DETECTED NOT DETECTED Final   Streptococcus species NOT DETECTED NOT DETECTED Final   Streptococcus agalactiae NOT DETECTED NOT DETECTED Final   Streptococcus pneumoniae NOT DETECTED NOT DETECTED Final   Streptococcus pyogenes NOT DETECTED NOT DETECTED Final   A.calcoaceticus-baumannii NOT DETECTED NOT DETECTED Final   Bacteroides fragilis NOT DETECTED NOT DETECTED Final   Enterobacterales NOT DETECTED NOT DETECTED Final   Enterobacter cloacae complex NOT DETECTED NOT DETECTED Final   Escherichia coli NOT DETECTED NOT DETECTED Final   Klebsiella aerogenes NOT DETECTED NOT DETECTED Final   Klebsiella oxytoca NOT DETECTED NOT DETECTED Final   Klebsiella pneumoniae NOT DETECTED NOT DETECTED Final   Proteus species NOT DETECTED NOT DETECTED Final   Salmonella species NOT DETECTED NOT DETECTED Final   Serratia marcescens NOT DETECTED NOT DETECTED Final   Haemophilus influenzae NOT DETECTED NOT DETECTED Final   Neisseria meningitidis NOT DETECTED NOT DETECTED Final   Pseudomonas aeruginosa NOT DETECTED NOT DETECTED Final   Stenotrophomonas maltophilia NOT DETECTED NOT DETECTED Final   Candida albicans NOT DETECTED NOT DETECTED  Final   Candida auris NOT DETECTED NOT DETECTED Final   Candida glabrata NOT DETECTED NOT DETECTED Final   Candida krusei NOT DETECTED NOT DETECTED Final   Candida parapsilosis NOT DETECTED NOT DETECTED Final   Candida tropicalis NOT DETECTED NOT DETECTED Final   Cryptococcus neoformans/gattii NOT DETECTED NOT DETECTED Final   Vancomycin resistance NOT DETECTED NOT DETECTED Final    Comment: Performed at [facility], [address] [place], [place] [address]  Culture, blood (Routine X 2) w Reflex to ID Panel     Status: Abnormal   Collection Time: 11/16/20  8:19 AM   Specimen: BLOOD  Result Value Ref Range Status   Specimen Description   Final    BLOOD RIGHT ARM Performed at [facility], [address], [place], [place] [address]    Special Requests   Final    BOTTLES DRAWN AEROBIC AND ANAEROBIC Blood Culture adequate volume Performed at [facility], [address] [place], [place] [address]    Culture  Setup Time   Final    GRAM POSITIVE COCCI IN BOTH AEROBIC AND ANAEROBIC BOTTLES CRITICAL VALUE NOTED.  VALUE IS CONSISTENT WITH PREVIOUSLY REPORTED AND CALLED VALUE. Performed at [facility], [address] [place], [place] [address]    Culture (A)  Final    ENTEROCOCCUS FAECALIS SUSCEPTIBILITIES PERFORMED ON PREVIOUS CULTURE WITHIN THE LAST 5 DAYS. Performed at [facility],  [address], [place], [place] [address]    Report Status 11/19/2020 FINAL  Final  Culture, blood (Routine X 2) w Reflex to ID Panel     Status: Abnormal   Collection Time: 11/16/20  8:36 AM   Specimen: BLOOD  Result Value Ref Range Status   Specimen Description   Final    BLOOD LEFT HAND Performed at [facility], [address] [place], [place] [address]    Special Requests   Final    BOTTLES DRAWN AEROBIC AND ANAEROBIC Blood Culture adequate volume Performed at [facility], [address] [place], [place] [address]     Culture  Setup Time   Final    GRAM POSITIVE COCCI IN BOTH AEROBIC AND ANAEROBIC BOTTLES CRITICAL RESULT CALLED TO, READ BACK BY AND VERIFIED WITH: JASON ROBINS @0029  ON 11/17/20 SKL    Culture (A)  Final    ENTEROCOCCUS FAECALIS SUSCEPTIBILITIES PERFORMED ON PREVIOUS CULTURE WITHIN THE LAST 5 DAYS. Performed at [facility], [place] [address], [place], [place] [address]    Report Status 11/19/2020 FINAL  Final  Blood Culture ID Panel (Reflexed)     Status: Abnormal   Collection Time: 11/16/20  8:36 AM  Result Value Ref Range Status   Enterococcus faecalis DETECTED (A) NOT DETECTED Final    Comment: CRITICAL RESULT CALLED TO, READ BACK BY AND VERIFIED WITH: JASON ROBINS @0029  ON 11/17/20 SKL    Enterococcus Faecium NOT DETECTED NOT DETECTED Final   Listeria monocytogenes NOT DETECTED NOT DETECTED Final   Staphylococcus species NOT DETECTED NOT DETECTED Final   Staphylococcus aureus (BCID) NOT DETECTED NOT DETECTED Final   Staphylococcus epidermidis NOT DETECTED NOT DETECTED Final   Staphylococcus lugdunensis NOT DETECTED NOT DETECTED Final   Streptococcus species NOT DETECTED NOT DETECTED Final   Streptococcus agalactiae NOT DETECTED NOT DETECTED Final   Streptococcus pneumoniae NOT DETECTED NOT DETECTED Final   Streptococcus pyogenes NOT DETECTED NOT DETECTED Final   A.calcoaceticus-baumannii NOT DETECTED NOT DETECTED Final   Bacteroides fragilis NOT DETECTED NOT DETECTED Final   Enterobacterales NOT DETECTED NOT DETECTED Final   Enterobacter cloacae complex NOT DETECTED NOT DETECTED Final   Escherichia coli NOT DETECTED NOT DETECTED Final   Klebsiella aerogenes NOT DETECTED NOT DETECTED Final   Klebsiella oxytoca NOT DETECTED NOT DETECTED Final   Klebsiella pneumoniae NOT DETECTED NOT DETECTED Final   Proteus species NOT DETECTED NOT DETECTED Final   Salmonella species NOT DETECTED NOT DETECTED Final   Serratia marcescens NOT DETECTED NOT DETECTED Final   Haemophilus  influenzae NOT DETECTED NOT DETECTED Final   Neisseria meningitidis NOT DETECTED NOT DETECTED Final   Pseudomonas aeruginosa NOT DETECTED NOT DETECTED Final   Stenotrophomonas maltophilia NOT DETECTED NOT DETECTED Final   Candida albicans NOT DETECTED NOT DETECTED Final   Candida auris NOT DETECTED NOT DETECTED Final   Candida glabrata NOT DETECTED NOT DETECTED Final   Candida krusei NOT DETECTED NOT DETECTED Final   Candida parapsilosis NOT DETECTED NOT DETECTED Final   Candida tropicalis NOT DETECTED NOT DETECTED Final   Cryptococcus neoformans/gattii NOT DETECTED NOT DETECTED Final   Vancomycin resistance NOT DETECTED NOT DETECTED Final    Comment: Performed at [facility], [address] [place], [place] [address]  Culture, blood (Routine X 2) w Reflex to ID Panel     Status: None   Collection Time: 11/18/20 12:03 AM   Specimen: BLOOD RIGHT FOREARM  Result Value Ref Range Status   Specimen  Description BLOOD RIGHT FOREARM  Final   Special Requests   Final    BOTTLES DRAWN AEROBIC AND ANAEROBIC Blood Culture adequate volume   Culture   Final    NO GROWTH 5 DAYS Performed at [facility], [address] [place], [place] [address]    Report Status 11/23/2020 FINAL  Final  Culture, blood (Routine X 2) w Reflex to ID Panel     Status: None   Collection Time: 11/18/20 12:03 AM   Specimen: BLOOD RIGHT HAND  Result Value Ref Range Status   Specimen Description BLOOD RIGHT HAND  Final   Special Requests   Final    BOTTLES DRAWN AEROBIC AND ANAEROBIC Blood Culture adequate volume   Culture   Final    NO GROWTH 5 DAYS Performed at [facility], [address] [place], [place] [address]    Report Status 11/23/2020 FINAL  Final  Surgical pcr screen     Status: Abnormal   Collection Time: 11/20/20  1:21 PM   Specimen: Nasal Mucosa; Nasal Swab  Result Value Ref Range Status   MRSA, PCR NEGATIVE NEGATIVE Final   Staphylococcus aureus POSITIVE (A)  NEGATIVE Final    Comment: (NOTE) The Xpert SA Assay (FDA approved for NASAL specimens in patients 69 years of age and older), is one component of a comprehensive surveillance program. It is not intended to diagnose infection nor to guide or monitor treatment. Performed at [facility], [address] [place], [place] [address]   CULTURE, BLOOD (ROUTINE X 2) w Reflex to ID Panel     Status: None (Preliminary result)   Collection Time: 11/20/20 11:28 PM   Specimen: BLOOD  Result Value Ref Range Status   Specimen Description BLOOD BLOOD RIGHT FOREARM  Final   Special Requests   Final    BOTTLES DRAWN AEROBIC AND ANAEROBIC Blood Culture adequate volume   Culture   Final    NO GROWTH 4 DAYS Performed at [facility], [address], [place], [place] [address]    Report Status PENDING  Incomplete  CULTURE, BLOOD (ROUTINE X 2) w Reflex to ID Panel     Status: None (Preliminary result)   Collection Time: 11/20/20 11:28 PM   Specimen: BLOOD  Result Value Ref Range Status   Specimen Description BLOOD BLOOD RIGHT HAND  Final   Special Requests   Final    BOTTLES DRAWN AEROBIC AND ANAEROBIC Blood Culture adequate volume   Culture   Final    NO GROWTH 4 DAYS Performed at [facility], [address], [place], [place] [address]    Report Status PENDING  Incomplete  Aerobic/Anaerobic Culture w Gram Stain (surgical/deep wound)     Status: None (Preliminary result)   Collection Time: 11/21/20 12:36 PM   Specimen: Wound  Result Value Ref Range Status   Specimen Description   Final    HEEL LEFT HEEL Performed at [facility], [address], [place], [place] [address]    Special Requests   Final    NONE Performed at [facility], [address] [place], [place] [address]    Gram Stain NO WBC SEEN RARE GRAM POSITIVE COCCI   Final   Culture   Final    RARE PSEUDOMONAS AERUGINOSA RARE STAPHYLOCOCCUS AUREUS SUSCEPTIBILITIES TO  FOLLOW Performed at [facility], [place] [address] [place], [place] [address]    Report Status PENDING  Incomplete   Organism ID, Bacteria PSEUDOMONAS AERUGINOSA  Final      Susceptibility   Pseudomonas aeruginosa - MIC*    CEFTAZIDIME 4 SENSITIVE Sensitive     CIPROFLOXACIN <=0.25 SENSITIVE Sensitive     GENTAMICIN 8 INTERMEDIATE Intermediate     IMIPENEM 2 SENSITIVE Sensitive     PIP/TAZO 16 SENSITIVE Sensitive     * RARE PSEUDOMONAS AERUGINOSA  Aerobic/Anaerobic Culture w Gram Stain (surgical/deep wound)     Status: None (Preliminary result)   Collection Time: 11/21/20 12:46 PM   Specimen: Bone; Tissue  Result Value Ref Range Status   Specimen Description   Final    BONE Performed at [facility], [address] [place], [place] [address]    Special Requests   Final    NONE Performed at [facility], [address] [place], [place] [address]    Gram Stain NO WBC SEEN NO ORGANISMS SEEN   Final   Culture   Final    RARE PSEUDOMONAS AERUGINOSA RARE STAPHYLOCOCCUS AUREUS SUSCEPTIBILITIES TO FOLLOW Performed at [facility], [place] [address] [place], [place] [address]    Report Status PENDING  Incomplete   Organism ID, Bacteria PSEUDOMONAS AERUGINOSA  Final      Susceptibility   Pseudomonas aeruginosa - MIC*    CEFTAZIDIME 4 SENSITIVE Sensitive     CIPROFLOXACIN <=0.25 SENSITIVE Sensitive     GENTAMICIN 8 INTERMEDIATE Intermediate     IMIPENEM 1 SENSITIVE Sensitive     PIP/TAZO 16 SENSITIVE Sensitive     * RARE PSEUDOMONAS AERUGINOSA    RADIOLOGY:  No results found.   CODE STATUS:     Code Status Orders  (From admission, onward)         Start     Ordered   11/11/20 1802  Do not attempt resuscitation (DNR)  Continuous       Question Answer Comment  In the event of cardiac or respiratory ARREST Do not call a "code blue"   In the event of cardiac or respiratory ARREST Do not perform Intubation, CPR, defibrillation or ACLS   In  the event of cardiac or respiratory ARREST Use medication by any route, position, wound care, and other measures to relive pain and suffering. May use oxygen, suction and manual treatment of airway obstruction as needed for comfort.      11/11/20 1805        Code Status History    Date Active Date Inactive Code Status Order ID Comments User Context   01/12/2019 1500 01/12/2019 1848 Full Code [id number]  Samara Deist, [facility] In[facility]   11/30/2018 1303 12/03/2018 1634 Full Code [id number]  [name], MD Inpatient   10/30/2018 [place] 10/30/2018 1944 Full Code [id number]  [name], MD Inpatient   10/20/2018 1332 10/20/2018 1732 Full Code [id number]  Samara Deist, DPM Inpatient   05/12/2017 1639 05/13/2017 1840 Full Code [id number]  [name], MD Inpatient   02/25/2017 1450 02/25/2017 1848 Full Code [id number]  Samara Deist, DPM Inpatient   07/10/2015 1430 07/12/2015 1354 Full Code [id number]  Aldean Jewett, MD Inpatient   Advance Care Planning Activity       TOTAL TIME TAKING CARE OF THIS PATIENT: *35* minutes.    Fritzi Mandes M.D  [facility]    CC: Primary care physician; [name], MD

## 2020-11-24 NOTE — Care Management Important Message (Signed)
Important Message  Patient Details  Name: [name] MRN: [id number] Date of Birth: May 09, 1951   Medicare Important Message Given:  Yes     Dannette Barbara 11/24/2020, 11:16 AM

## 2020-11-24 NOTE — Consult Note (Addendum)
[place] Nurse Consult Note: Reason for Consult: Consult requested to change left heel Vac.  Podiatry following for assessment and plan of care; pt plans to discharge today with home health assistance.  Wound type: Full thickness post-op wound to left heel. Previous track pad was clogged with mod amt old bloody drainage.  Measurement: 5X5X.3cm Wound bed: 95% beefy red, 5% yellow interspersed throughout Drainage (amount, consistency, odor) small amt blood-tinged drainage Periwound: intact skin surrounding Dressing procedure/placement/frequency: Pt was medicated for pain prior to the procedure and tolerated without c/o pain.  Applied barrier ring around wound edges to attempt to maintain a seal, and one piece black foam to 159mm cont suction. Bridged track pad to anterior foot.  Ace wrap applied and foot in Prevalon boot.  Pt plans to discharge today with home health according to progress notes; their staff should hook to the home negative pressure device when it is delivered prior to discharge.  Please re-consult if further assistance is needed.  Thank-you,  Julien Girt MSN, [place], [place], [place], [place]

## 2020-11-24 NOTE — Progress Notes (Addendum)
Pt will be discharging home with PICC line in place. Education on proper nutrition to promote wound healing has been completed with pt and family member at bedside. Pt states "I know all of that." Waiting on infusion representative at this time.   23 Pt discharged  home.

## 2020-11-24 NOTE — Progress Notes (Signed)
Date of Admission:  11/11/2020     ID: [name] is a 70 y.o. male  Principal Problem:   Sepsis ([place]) Active Problems:   PAD (peripheral artery disease) ([place])   Diabetes ([place])   Mixed hyperlipidemia   Essential hypertension   GERD (gastroesophageal reflux disease)   Uncontrolled type 2 diabetes mellitus with hyperglycemia (HCC)   Cardiomyopathy, nonischemic (HCC)   Moderate asthma without complication   Diabetic foot ulcer ([place])   Bacteremia due to Enterococcus   Bacteremia due to methicillin susceptible Staphylococcus aureus (MSSA)    Subjective: Patient says he is doing better Wound VAC on the left heel has been changed today  Medications:  . aspirin EC  81 mg Oral Daily  . Chlorhexidine Gluconate Cloth  6 each Topical Q0600  . ciprofloxacin  500 mg Oral BID  . clopidogrel  75 mg Oral Daily  . docusate sodium  100 mg Oral BID  . enoxaparin (LOVENOX) injection  40 mg Subcutaneous Q24H  . fluticasone  2 spray Each Nare Daily  . gabapentin  300 mg Oral TID  . glyBURIDE  5 mg Oral BID WC  . insulin aspart  0-15 Units Subcutaneous TID WC  . insulin aspart  0-5 Units Subcutaneous QHS  . metroNIDAZOLE  500 mg Oral 3 times per day  . mometasone-formoterol  2 puff Inhalation BID  . mupirocin ointment  1 application Nasal BID  . oxybutynin  5 mg Oral BID  . pantoprazole  40 mg Oral Daily  . polyethylene glycol  17 g Oral Daily  . pravastatin  10 mg Oral q1800  . sodium chloride flush  10-40 mL Intracatheter Q12H    Objective: Vital signs in last 24 hours: Temp:  [97.6 F (36.4 C)-98.9 F (37.2 C)] 97.6 F (36.4 C) (02/28 0848) Pulse Rate:  [87-104] 101 (02/28 0848) Resp:  [14-20] 18 (02/28 0413) BP: (103-120)/(52-74) 104/55 (02/28 0848) SpO2:  [90 %-100 %] 100 % (02/28 0848)  PHYSICAL EXAM:  General: Alert, cooperative, no distress, appears stated age.  Lungs: Clear to auscultation bilaterally. No Wheezing or Rhonchi. No rales. Heart: Regular rate and  rhythm, no murmur, rub or gallop. Abdomen: Soft, non-tender,not distended. Bowel sounds normal. No masses Extremities: Left foot wound VAC on the heel Skin: No rashes or lesions. Or bruising Lymph: Cervical, supraclavicular normal. Neurologic: Grossly non-focal  Lab Results Recent Labs    11/23/20 0502  WBC 4.9  HGB 10.3*  HCT 32.0*  CREATININE 0.92    Microbiology 11/11/2020 4 out of 4 blood culture positive for Enterococcus faecalis and staph aureus 11/12/2020 1 out of 4 bottle Enterococcus faecalis 11/12/2020 wound culture has Enterococcus and staph aureus and Prevotella brevia.  11/14/2020 blood culture no growth   11/15/2020 blood culture 3 out of 4 bottles Enterococcus faecalis  11/16/2020 4 out of 4 bottles Enterococcus faecalis. 2/22/2022BC NG  11/20/20 BC- NG 11/21/20 Tissue culture pseudomonas and staph aureus  Bone pathology- no osteomyelitis     Assessment/Plan:  Enterococcus bacteremia Staph aureus bacteremia Left heel wound: Superficial culture had Enterococcus and Staph aureus The eschar was debrided and underlying tissue culture is positive for staph aureus and Pseudomonas  Bone biopsy negative for osteomyelitis. TEE negative for endocarditis Extensive imaging was negative for any infection in the spine or intra-abdominal Patient had persistent Enterococcus bacteremia.  It was thought to be either from the wound on the left heel versus daptomycin failure Patient is currently on vancomycin.  He will go home  on vancomycin for at least 4 to 6 weeks Ciprofloxacin p.o. will be given for 2 weeks to treat the Pseudomonas in the wound culture. I'll see him as outpatient.  Left foot ulcer over the plantar aspect of the MTP first area  Diabetes mellitus on insulin  Diabetic neuropathy Peripheral artery disease Underwent percutaneous transluminal angioplasty of the left peroneal artery  Discussed the management with the patient.  He is going home  today.

## 2020-11-25 ENCOUNTER — Other Ambulatory Visit: Payer: Self-pay

## 2020-11-25 DIAGNOSIS — E1142 Type 2 diabetes mellitus with diabetic polyneuropathy: Secondary | ICD-10-CM | POA: Diagnosis not present

## 2020-11-25 DIAGNOSIS — J449 Chronic obstructive pulmonary disease, unspecified: Secondary | ICD-10-CM | POA: Diagnosis not present

## 2020-11-25 DIAGNOSIS — I509 Heart failure, unspecified: Secondary | ICD-10-CM

## 2020-11-25 DIAGNOSIS — I110 Hypertensive heart disease with heart failure: Secondary | ICD-10-CM | POA: Diagnosis not present

## 2020-11-25 DIAGNOSIS — E1151 Type 2 diabetes mellitus with diabetic peripheral angiopathy without gangrene: Secondary | ICD-10-CM | POA: Diagnosis not present

## 2020-11-25 DIAGNOSIS — E11621 Type 2 diabetes mellitus with foot ulcer: Secondary | ICD-10-CM | POA: Diagnosis not present

## 2020-11-25 DIAGNOSIS — L97421 Non-pressure chronic ulcer of left heel and midfoot limited to breakdown of skin: Secondary | ICD-10-CM | POA: Diagnosis not present

## 2020-11-25 DIAGNOSIS — M19011 Primary osteoarthritis, right shoulder: Secondary | ICD-10-CM | POA: Diagnosis not present

## 2020-11-25 DIAGNOSIS — A4101 Sepsis due to Methicillin susceptible Staphylococcus aureus: Secondary | ICD-10-CM | POA: Diagnosis not present

## 2020-11-25 LAB — CULTURE, BLOOD (ROUTINE X 2)
Culture: NO GROWTH
Culture: NO GROWTH
Special Requests: ADEQUATE
Special Requests: ADEQUATE

## 2020-11-25 NOTE — Patient Outreach (Signed)
[facility]) Care Management  [place]  11/25/2020   [name] 1951/04/30 [id number]  Subjective: spoke with patient. He is glad to be home.  He reports home nurse present today to teach about IV vancomycin. Patient has left foot wound that currently has a wound vac attached per patient. He is getting IV vancomycin q 12 hrs via right arm PICC.  He states his girlfriend's daughter [name] will help with administration of vancomycin. He states that his girlfriend's son and daughter are helping with his extra care he needs as well as his girlfriend's needs.  He states he is able to get up and walk around, bathe and dress himself.  Patient states he normally drives as well but due to his foot wound he will not be driving.  He denies transportation problems to appointments.  Daughter [name] to make follow up appointments.  Discussed importance of follow up appointments.  He verbalized understanding.    Discussed [facility] ongoing support for follow up. Patient is agreeable.    70 year old with history of essential hypertension, nonischemic cardiomyopathy, DM2, peripheral neuropathy, PAD, history of tobacco use quit 1998 presents with shortness of breath, poor oral intake nausea, diarrhea with left lower extremity swelling with worsening diabetic foot infection.    Patient has long standing diabetes and has developed a heel ulcer that got infected causing hospitalization.  Currently wound vac is the treatment.  He follows Dr. Vickki Muff for his wound.  Discussed signs of infection and seeking medical treatment. Patient has not checked his sugar since being home. Encouraged patient to check sugars regularly and how blood sugar control ins essential for wound healing.  He verbalized understanding.    Patient has all his medications and get medication locally from [facility]. Discussed [employer] mail order with patient but he prefers Management consultant.      Objective:   Encounter  Medications:  Outpatient Encounter Medications as of 11/25/2020  Medication Sig  . aspirin EC 81 MG tablet Take 81 mg by mouth daily.  . Calcium Carbonate-Vitamin D (CALCIUM PLUS VITAMIN D PO) Take 1 tablet by mouth daily.  . ciprofloxacin (CIPRO) 500 MG tablet Take 1 tablet (500 mg total) by mouth 2 (two) times daily for 14 days.  . clopidogrel (PLAVIX) 75 MG tablet TAKE 1 TABLET BY MOUTH ONCE DAILY  . gabapentin (NEURONTIN) 300 MG capsule Take 1 capsule (300 mg total) by mouth 3 (three) times daily.  Marland Kitchen glucose blood (ACCU-CHEK GUIDE) test strip Use as instructed  . glyBURIDE (DIABETA) 5 MG tablet Take 1 tablet (5 mg total) by mouth 2 (two) times daily with a meal.  . Lancets (ACCU-CHEK SOFT TOUCH) lancets Use as instructed  . lovastatin (MEVACOR) 10 MG tablet Take 1 tab  Po QHS  . meloxicam (MOBIC) 15 MG tablet Take by mouth.  . mometasone-formoterol (DULERA) 100-5 MCG/ACT AERO Inhale 2 puffs into the lungs 2 (two) times daily as needed for wheezing.  . ondansetron (ZOFRAN-ODT) 4 MG disintegrating tablet Take 1 tablet (4 mg total) by mouth every 8 (eight) hours as needed for nausea or vomiting.  Marland Kitchen oxybutynin (DITROPAN) 5 MG tablet Take 1 tablet (5 mg total) by mouth 2 (two) times daily.  . traMADol (ULTRAM) 50 MG tablet TAKE 1 TABLET BY MOUTH EVERY 12 HOURS ASNEEDED FOR PAIN  . vancomycin IVPB Inject 1,000 mg into the vein every 12 (twelve) hours. Indication:  Enterococcus and MSSA bacteremia, Left foot infection First Dose: Yes Last Day of  Therapy:  12/28/20 Labs - Monday:  CBC/D, CMP, and vancomycin trough. Labs - Thursday:  BMP and vancomycin trough Remove PIC line with completion of antibiotics Method of administration:Elastomeric Method of administration may be changed at the discretion of the patient and/or caregiver's ability to self-administer the medication ordered.   No facility-administered encounter medications on file as of 11/25/2020.    Functional Status:  In your present  state of health, do you have any difficulty performing the following activities: 11/25/2020 11/12/2020  Hearing? N N  Vision? N N  Difficulty concentrating or making decisions? N N  Walking or climbing stairs? Y Y  Comment weakness and foot wound -  Dressing or bathing? N N  Doing errands, shopping? N N  Preparing Food and eating ? N -  Using the Toilet? N -  In the past six months, have you accidently leaked urine? N -  Do you have problems with loss of bowel control? N -  Managing your Medications? N -  Managing your Finances? N -  Housekeeping or managing your Housekeeping? Y -  Comment daughter [name] helps -  Some recent data might be hidden    Fall/Depression Screening: Fall Risk  11/25/2020 09/22/2020 06/20/2020  Falls in the past year? 0 0 0  Number falls in past yr: - - -  Injury with Fall? - - -   PHQ 2/9 Scores 11/25/2020 09/22/2020 06/20/2020 03/20/2020 12/17/2019 09/17/2019 06/15/2019  PHQ - 2 Score 0 0 0 0 0 0 0    Assessment: Patient with recent hospitalization due to infected left heel wound.  Patient has home health involved for PICC care, PT and OT as well.   Goals Addressed            This Visit's Progress   . Make and Keep All Appointments       Timeframe:  Long-Range Goal Priority:  High Start Date:    11/25/20                         Expected End Date:    05/27/21                   Follow Up Date 12/25/20   - keep a calendar with appointment dates    Why is this important?    Part of staying healthy is seeing the doctor for follow-up care.   If you forget your appointments, there are some things you can do to stay on track.    Notes: Patient daughter to make follow up appointments today 11/25/20.      . Monitor and Manage My Blood Sugar-Diabetes Type 2       Timeframe:  Long-Range Goal Priority:  High Start Date:    11/25/20                         Expected End Date:   05/27/21                    Follow Up Date 12/25/20 - check blood sugar at prescribed  times - take the blood sugar log to all doctor visits    Why is this important?    Checking your blood sugar at home helps to keep it from getting very high or very low.   Writing the results in a diary or log helps the doctor know how to care for you.   Your blood  sugar log should have the time, date and the results.   Also, write down the amount of insulin or other medicine that you take.   Other information, like what you ate, exercise done and how you were feeling, will also be helpful.     Notes: Patient currently not checking encouraged to check.       Plan: RN CM will provide ongoing education and support to patient through phone calls.   RN CM will send welcome packet with consent to patient.   RN CM will send initial barriers letter, assessment, and care plan to primary care physician.   RN CM will contact patient next week and patient agrees to next contact.  Follow-up:  Patient agrees to Care Plan and Follow-up.   [name], RN, MSN [place] Management Care Management Coordinator Direct Line [phone] Cell [phone] Toll Free: [phone]  Fax: [phone]

## 2020-11-25 NOTE — Patient Instructions (Signed)
Goals Addressed            This Visit's Progress   . Careful Skin Care-wound vac left heel       Timeframe:  Short-Term Goal Priority:  High Start Date:   11/25/20                          Expected End Date:  01/24/21                     Follow Up Date 12/25/20  - try not to scratch my skin  -keep wound vac intact -monitor for signs such as redness, swelling or pain to site   Why is this important?    A rash or skin blisters are common when you have GVHD.    Taking really good care of your skin will help to keep your skin unbroken.    Notes: 11/25/20 Patient reports wound vac intact with no redness or swelling.      . Make and Keep All Appointments       Timeframe:  Long-Range Goal Priority:  High Start Date:    11/25/20                         Expected End Date:    05/27/21                   Follow Up Date 12/25/20   - keep a calendar with appointment dates    Why is this important?    Part of staying healthy is seeing the doctor for follow-up care.   If you forget your appointments, there are some things you can do to stay on track.    Notes: Patient daughter to make follow up appointments today 11/25/20.      . Monitor and Manage My Blood Sugar-Diabetes Type 2       Timeframe:  Long-Range Goal Priority:  High Start Date:    11/25/20                         Expected End Date:   05/27/21                    Follow Up Date 12/25/20 - check blood sugar at prescribed times - take the blood sugar log to all doctor visits    Why is this important?    Checking your blood sugar at home helps to keep it from getting very high or very low.   Writing the results in a diary or log helps the doctor know how to care for you.   Your blood sugar log should have the time, date and the results.   Also, write down the amount of insulin or other medicine that you take.   Other information, like what you ate, exercise done and how you were feeling, will also be helpful.     Notes:  Patient currently not checking encouraged to check.

## 2020-11-26 DIAGNOSIS — E11621 Type 2 diabetes mellitus with foot ulcer: Secondary | ICD-10-CM | POA: Diagnosis not present

## 2020-11-26 DIAGNOSIS — I110 Hypertensive heart disease with heart failure: Secondary | ICD-10-CM | POA: Diagnosis not present

## 2020-11-26 DIAGNOSIS — I509 Heart failure, unspecified: Secondary | ICD-10-CM | POA: Diagnosis not present

## 2020-11-26 DIAGNOSIS — E1142 Type 2 diabetes mellitus with diabetic polyneuropathy: Secondary | ICD-10-CM | POA: Diagnosis not present

## 2020-11-26 DIAGNOSIS — A4101 Sepsis due to Methicillin susceptible Staphylococcus aureus: Secondary | ICD-10-CM | POA: Diagnosis not present

## 2020-11-26 DIAGNOSIS — J449 Chronic obstructive pulmonary disease, unspecified: Secondary | ICD-10-CM | POA: Diagnosis not present

## 2020-11-26 DIAGNOSIS — E1151 Type 2 diabetes mellitus with diabetic peripheral angiopathy without gangrene: Secondary | ICD-10-CM | POA: Diagnosis not present

## 2020-11-26 DIAGNOSIS — L97421 Non-pressure chronic ulcer of left heel and midfoot limited to breakdown of skin: Secondary | ICD-10-CM | POA: Diagnosis not present

## 2020-11-26 DIAGNOSIS — M19011 Primary osteoarthritis, right shoulder: Secondary | ICD-10-CM | POA: Diagnosis not present

## 2020-11-27 ENCOUNTER — Other Ambulatory Visit: Payer: Self-pay | Admitting: Hospice and Palliative Medicine

## 2020-11-27 DIAGNOSIS — A419 Sepsis, unspecified organism: Secondary | ICD-10-CM | POA: Diagnosis not present

## 2020-11-27 LAB — AEROBIC/ANAEROBIC CULTURE W GRAM STAIN (SURGICAL/DEEP WOUND)
Gram Stain: NONE SEEN
Gram Stain: NONE SEEN

## 2020-11-28 ENCOUNTER — Other Ambulatory Visit: Payer: Self-pay

## 2020-11-28 DIAGNOSIS — E1165 Type 2 diabetes mellitus with hyperglycemia: Secondary | ICD-10-CM

## 2020-11-28 DIAGNOSIS — R110 Nausea: Secondary | ICD-10-CM

## 2020-11-28 DIAGNOSIS — N3281 Overactive bladder: Secondary | ICD-10-CM

## 2020-11-28 MED ORDER — ONDANSETRON 4 MG PO TBDP: 4.0000 mg | ORAL_TABLET | Freq: Three times a day (TID) | ORAL | 1 refills | Status: DC | PRN

## 2020-11-28 MED ORDER — GABAPENTIN 300 MG PO CAPS: 300.0000 mg | ORAL_CAPSULE | Freq: Three times a day (TID) | ORAL | 1 refills | Status: DC

## 2020-11-28 MED ORDER — OXYBUTYNIN CHLORIDE 5 MG PO TABS: 5.0000 mg | ORAL_TABLET | Freq: Two times a day (BID) | ORAL | 0 refills | Status: DC

## 2020-11-28 MED ORDER — GLYBURIDE 5 MG PO TABS: 5.0000 mg | ORAL_TABLET | Freq: Two times a day (BID) | ORAL | 1 refills | Status: DC

## 2020-11-29 DIAGNOSIS — A419 Sepsis, unspecified organism: Secondary | ICD-10-CM | POA: Diagnosis not present

## 2020-11-29 LAB — SUSCEPTIBILITY, AER + ANAEROB

## 2020-12-01 ENCOUNTER — Other Ambulatory Visit: Payer: Self-pay

## 2020-12-01 ENCOUNTER — Other Ambulatory Visit (INDEPENDENT_AMBULATORY_CARE_PROVIDER_SITE_OTHER): Payer: Self-pay | Admitting: Nurse Practitioner

## 2020-12-01 DIAGNOSIS — I739 Peripheral vascular disease, unspecified: Secondary | ICD-10-CM | POA: Diagnosis not present

## 2020-12-01 DIAGNOSIS — E1142 Type 2 diabetes mellitus with diabetic polyneuropathy: Secondary | ICD-10-CM | POA: Diagnosis not present

## 2020-12-01 DIAGNOSIS — Z89422 Acquired absence of other left toe(s): Secondary | ICD-10-CM | POA: Diagnosis not present

## 2020-12-01 DIAGNOSIS — A419 Sepsis, unspecified organism: Secondary | ICD-10-CM | POA: Diagnosis not present

## 2020-12-01 DIAGNOSIS — L97522 Non-pressure chronic ulcer of other part of left foot with fat layer exposed: Secondary | ICD-10-CM | POA: Diagnosis not present

## 2020-12-01 DIAGNOSIS — I10 Essential (primary) hypertension: Secondary | ICD-10-CM | POA: Diagnosis not present

## 2020-12-01 LAB — MIC RESULT

## 2020-12-01 LAB — MINIMUM INHIBITORY CONC. (1 DRUG)

## 2020-12-01 MED ORDER — CLOPIDOGREL BISULFATE 75 MG PO TABS: 75.0000 mg | ORAL_TABLET | Freq: Every day | ORAL | 3 refills | Status: DC

## 2020-12-01 NOTE — Patient Outreach (Signed)
[facility]) Care Management  12/01/2020  [name] 1951-01-15 [id number]   Telephone call to patient for follow uip. No answer. HIPAA compliant voice message left.  Plan: RN CM will attempt again within 4 business days.   [name], RN, MSN [place] Management Care Management Coordinator Direct Line [phone] Cell [phone] Toll Free: [phone]  Fax: [phone]

## 2020-12-02 ENCOUNTER — Encounter: Payer: Self-pay | Admitting: Internal Medicine

## 2020-12-02 ENCOUNTER — Telehealth: Payer: Self-pay

## 2020-12-02 ENCOUNTER — Ambulatory Visit (INDEPENDENT_AMBULATORY_CARE_PROVIDER_SITE_OTHER): Payer: Medicare HMO | Admitting: Internal Medicine

## 2020-12-02 VITALS — BP 148/66 | HR 116 | Temp 98.2°F | Resp 16 | Ht 70.0 in | Wt 180.0 lb

## 2020-12-02 DIAGNOSIS — J439 Emphysema, unspecified: Secondary | ICD-10-CM | POA: Diagnosis not present

## 2020-12-02 DIAGNOSIS — M60004 Infective myositis, unspecified left leg: Secondary | ICD-10-CM

## 2020-12-02 DIAGNOSIS — I739 Peripheral vascular disease, unspecified: Secondary | ICD-10-CM

## 2020-12-02 DIAGNOSIS — J9611 Chronic respiratory failure with hypoxia: Secondary | ICD-10-CM

## 2020-12-02 DIAGNOSIS — Z792 Long term (current) use of antibiotics: Secondary | ICD-10-CM | POA: Diagnosis not present

## 2020-12-02 DIAGNOSIS — J432 Centrilobular emphysema: Secondary | ICD-10-CM | POA: Diagnosis not present

## 2020-12-02 NOTE — Progress Notes (Signed)
[facility] [place], [place] [address]  Internal MEDICINE  Office Visit Note  Patient Name: [name]  [id number]  [id number]  Date of Service: 12/02/2020  Transitional care management    Chief Complaint  Patient presents with  . Hospitalization Follow-up    Foot wound left foot  . Shortness of Breath     HPI Pt is here for recent hospital follow up. 70 year old with history of essential hypertension, nonischemic cardiomyopathy, DM2, peripheral neuropathy, PAD, history of tobacco use quit 1998 sent to ED for acute  shortness of breath with hypoxia  poor oral intake nausea, diarrhea with left lower extremity swelling with worsening diabetic foot infection. He was admitted for 10 days in [facility]. Pt was found to be septic due to diabetic foot infection of his left lower ext with ulceration and cellulitis MSSA/ enertococcus.  MRI foot-showing cellulitis and myositis but no evidence of osteomyelitis -Antibiotics- vancomycin per ID along with oral cipro, will follow up by Dr Rosana Hoes and ID Pt was also evaluated by vascular and had angioplasty of left peroneal artery Has end stage COPD and has been hypoxic   Current Medication: Outpatient Encounter Medications as of 12/02/2020  Medication Sig  . aspirin EC 81 MG tablet Take 81 mg by mouth daily.  . Calcium Carbonate-Vitamin D (CALCIUM PLUS VITAMIN D PO) Take 1 tablet by mouth daily.  . ciprofloxacin (CIPRO) 500 MG tablet Take 1 tablet (500 mg total) by mouth 2 (two) times daily for 14 days.  . clopidogrel (PLAVIX) 75 MG tablet Take 1 tablet (75 mg total) by mouth daily.  Marland Kitchen gabapentin (NEURONTIN) 300 MG capsule Take 1 capsule (300 mg total) by mouth 3 (three) times daily.  Marland Kitchen glucose blood (ACCU-CHEK GUIDE) test strip Use as instructed  . glyBURIDE (DIABETA) 5 MG tablet Take 1 tablet (5 mg total) by mouth 2 (two) times daily with a meal.  . Lancets (ACCU-CHEK SOFT TOUCH) lancets Use as instructed  . lovastatin  (MEVACOR) 10 MG tablet Take 1 tab  Po QHS  . meloxicam (MOBIC) 15 MG tablet Take by mouth.  . mometasone-formoterol (DULERA) 100-5 MCG/ACT AERO Inhale 2 puffs into the lungs 2 (two) times daily as needed for wheezing.  . ondansetron (ZOFRAN-ODT) 4 MG disintegrating tablet Take 1 tablet (4 mg total) by mouth every 8 (eight) hours as needed for nausea or vomiting.  Marland Kitchen oxybutynin (DITROPAN) 5 MG tablet Take 1 tablet (5 mg total) by mouth 2 (two) times daily.  . traMADol (ULTRAM) 50 MG tablet TAKE 1 TABLET BY MOUTH EVERY 12 HOURS ASNEEDED FOR PAIN  . vancomycin IVPB Inject 1,000 mg into the vein every 12 (twelve) hours. Indication:  Enterococcus and MSSA bacteremia, Left foot infection First Dose: Yes Last Day of Therapy:  12/28/20 Labs - Monday:  CBC/D, CMP, and vancomycin trough. Labs - Thursday:  BMP and vancomycin trough Remove PIC line with completion of antibiotics Method of administration:Elastomeric Method of administration may be changed at the discretion of the patient and/or caregiver's ability to self-administer the medication ordered.   No facility-administered encounter medications on file as of 12/02/2020.    Surgical History: Past Surgical History:  Procedure Laterality Date  . AMPUTATION TOE Left 02/25/2017   Procedure: AMPUTATION TOE-LEFT 2ND MPJ;  Surgeon: Samara Deist, DPM;  Location: [facility] ORS;  Service: Podiatry;  Laterality: Left;  . AMPUTATION TOE Left 10/20/2018   Procedure: TOE MPJ T2 LEFT;  Surgeon: Samara Deist, DPM;  Location: [facility] ORS;  Service: Podiatry;  Laterality: Left;  . AMPUTATION TOE Left 01/12/2019   Procedure: AMPUTATION LEFT 5TH TOE AND JOINT;  Surgeon: Samara Deist, DPM;  Location: [facility] ORS;  Service: Podiatry;  Laterality: Left;  . BACK SURGERY  2008  . BONE BIOPSY Left 11/21/2020   Procedure: BONE BIOPSY;  Surgeon: Samara Deist, DPM;  Location: [facility] ORS;  Service: Podiatry;  Laterality: Left;  . CHOLECYSTECTOMY    . COLONOSCOPY WITH PROPOFOL N/A  08/15/2017   Procedure: COLONOSCOPY WITH PROPOFOL;  Surgeon: Lollie Sails, MD;  Location[facility];  Service: Endoscopy;  Laterality: N/A;  . ENDARTERECTOMY Right 05/12/2017   Procedure: ENDARTERECTOMY CAROTID;  Surgeon: [name], MD;  Location: [facility] ORS;  Service: Vascular;  Laterality: Right;  . ENDARTERECTOMY FEMORAL Left 12/01/2018   Procedure: ENDARTERECTOMY FEMORAL;  Surgeon: [name], MD;  Location: [facility] ORS;  Service: Vascular;  Laterality: Left;  . ERCP    . FOOT SURGERY Right    x 3  . IRRIGATION AND DEBRIDEMENT FOOT Left 11/21/2020   Procedure: IRRIGATION AND DEBRIDEMENT FOOT--with placement of wound vac;  Surgeon: Samara Deist, DPM;  Location: [facility] ORS;  Service: Podiatry;  Laterality: Left;  . LOWER EXTREMITY ANGIOGRAM Left 12/01/2018   Procedure: LOWER EXTREMITY ANGIOGRAM;  Surgeon: [name], MD;  Location: [facility] OR[facility];  Service: Vascular;  Laterality: Left;  . LOWER EXTREMITY ANGIOGRAPHY Left 10/30/2018   Procedure: LOWER EXTREMITY ANGIOGRAPHY;  Surgeon: [name], MD;  Location: [place] [facility];  Service: Cardiovascular;  Laterality: Left;  . LOWER EXTREMITY ANGIOGRAPHY Left 11/30/2018   Procedure: LOWER EXTREMITY ANGIOGRAPHY;  Surgeon: [name], MD;  Location: [place] [facility];  Service: Cardiovascular;  Laterality: Left;  . LOWER EXTREMITY ANGIOGRAPHY Left 11/17/2020   Procedure: Lower Extremity Angiography;  Surgeon: [name], MD;  Location: [place] [facility];  Service: Cardiovascular;  Laterality: Left;  . TEE WITHOUT CARDIOVERSION N/A 11/14/2020   Procedure: TRANSESOPHAGEAL ECHOCARDIOGRAM (TEE);  Surgeon: Corey Skains, MD;  Location: [facility] OR[facility];  Service: Cardiovascular;  Laterality: N/A;    Medical History: Past Medical History:  Diagnosis Date  . Cholecystitis, chronic   . Cholelithiasis   . COPD (chronic obstructive pulmonary disease) ([place])   . Diabetes mellitus without complication ([place])   . Diabetic retinopathy ([place])   .  Dyspnea   . Fatty liver 2008  . Fracture of clavicle 1992   left   . Fracture of ribs, multiple 1992   3 ribs  . GERD (gastroesophageal reflux disease)   . Hyperlipidemia   . Hypertension   . Paroxysmal SVT (supraventricular tachycardia) ([place])   . Pelvis fracture ([place]) 1992  . Peripheral vascular disease ([place])   . Pleurisy   . Pleurisy   . Seasonal allergies     Family History: Family History  Problem Relation Age of Onset  . Diabetes Sister     Social History   Socioeconomic History  . Marital status: Single    Spouse name: Not on file  . Number of children: Not on file  . Years of education: Not on file  . Highest education level: Not on file  Occupational History  . Not on file  Tobacco Use  . Smoking status: Former Smoker    Quit date: 02/24/1997    Years since quitting: 23.7  . Smokeless tobacco: Never Used  Vaping Use  . Vaping Use: Never used  Substance and Sexual Activity  . Alcohol use: No  . Drug  use: No  . Sexual activity: Not on file  Other Topics Concern  . Not on file  Social History Narrative  . Not on file   Social Determinants of Health   Financial Resource Strain: Not on file  Food Insecurity: No Food Insecurity  . Worried About Charity fundraiser in the Last Year: Never true  . Ran Out of Food in the Last Year: Never true  Transportation Needs: No Transportation Needs  . Lack of Transportation (Medical): No  . Lack of Transportation (Non-Medical): No  Physical Activity: Not on file  Stress: Not on file  Social Connections: Not on file  Intimate Partner Violence: Not on file      Review of Systems  Constitutional: Negative for chills, fatigue and unexpected weight change.  HENT: Positive for postnasal drip. Negative for congestion, rhinorrhea, sneezing and sore throat.   Eyes: Negative for redness.  Respiratory: Positive for chest tightness and shortness of breath. Negative for cough.   Cardiovascular: Negative for chest pain  and palpitations.  Gastrointestinal: Negative for abdominal pain, constipation, diarrhea, nausea and vomiting.  Genitourinary: Negative for dysuria and frequency.  Musculoskeletal: Positive for gait problem and joint swelling. Negative for arthralgias, back pain and neck pain.  Skin: Positive for wound. Negative for rash.  Neurological: Negative for tremors and numbness.  Hematological: Negative for adenopathy. Does not bruise/bleed easily.  Psychiatric/Behavioral: Negative for behavioral problems (Depression), sleep disturbance and suicidal ideas. The patient is not nervous/anxious.     Vital Signs: BP (!) 148/66   Pulse (!) 116   Temp 98.2 F (36.8 C)   Resp 16   Ht 5\' 10"  (1.778 m)   Wt 180 lb (81.6 kg) Comment: pt in wheelchair  SpO2 (!) 88% Comment: room air  BMI 25.83 kg/m    Physical Exam Vitals reviewed.  Constitutional:      General: He is not in acute distress.    Appearance: He is well-developed. He is not diaphoretic.  HENT:     Head: Normocephalic and atraumatic.     Mouth/Throat:     Pharynx: No oropharyngeal exudate.  Eyes:     Pupils: Pupils are equal, round, and reactive to light.  Neck:     Thyroid: No thyromegaly.     Vascular: No JVD.     Trachea: No tracheal deviation.  Cardiovascular:     Rate and Rhythm: Regular rhythm. Tachycardia present.     Heart sounds: Normal heart sounds. No murmur heard. No friction rub. No gallop.   Pulmonary:     Effort: Pulmonary effort is normal. No respiratory distress.     Breath sounds: Decreased breath sounds, wheezing and rhonchi present. No rales.  Chest:     Chest wall: No tenderness.  Abdominal:     General: Bowel sounds are normal.     Palpations: Abdomen is soft.  Musculoskeletal:     Cervical back: Normal range of motion and neck supple.     Left lower leg: Tenderness present. Edema present.  Lymphadenopathy:     Cervical: No cervical adenopathy.  Skin:    General: Skin is warm and dry.      Findings: Erythema present.  Neurological:     Mental Status: He is alert and oriented to person, place, and time.     Cranial Nerves: No cranial nerve deficit.  Psychiatric:        Behavior: Behavior normal.        Thought Content: Thought content normal.  Judgment: Judgment normal.       Assessment/Plan: 1. Chronic respiratory failure with hypoxia (HCC) Pt continues to be hypoxic, will start O2 at home  - For home use only DME oxygen  2. Centrilobular emphysema ([place]) Continue management as before, on Dulera, might need Doneb and neb at home   3. Long term (current) use of antibiotics Pt has to be on 6 weeks of Vancomycin and oral cipro, wound management to see   4. Infective myositis of left lower extremity Followed by surgery and ID   5. PVD (peripheral vascular disease) ([place]) Followed by Vascular   General Counseling: Duff verbalizes understanding of the findings of todays visit and agrees with plan of treatment. I have discussed any further diagnostic evaluation that may be needed or ordered today. We also reviewed his medications today. he has been encouraged to call the office with any questions or concerns that should arise related to todays visit.    Orders Placed This Encounter  Procedures  . For home use only DME oxygen    I have reviewed all medical records from hospital follow up including radiology reports and consults from other physicians. Appropriate follow up diagnostics will be scheduled as needed. Patient/ Family understands the plan of treatment. Time spent 45 minutes.   Dr [name], MD Internal Medicine

## 2020-12-02 NOTE — Telephone Encounter (Signed)
[name] [name]

## 2020-12-03 ENCOUNTER — Other Ambulatory Visit: Payer: Self-pay

## 2020-12-03 DIAGNOSIS — E11621 Type 2 diabetes mellitus with foot ulcer: Secondary | ICD-10-CM | POA: Diagnosis not present

## 2020-12-03 DIAGNOSIS — I509 Heart failure, unspecified: Secondary | ICD-10-CM | POA: Diagnosis not present

## 2020-12-03 DIAGNOSIS — E1151 Type 2 diabetes mellitus with diabetic peripheral angiopathy without gangrene: Secondary | ICD-10-CM | POA: Diagnosis not present

## 2020-12-03 DIAGNOSIS — M19011 Primary osteoarthritis, right shoulder: Secondary | ICD-10-CM | POA: Diagnosis not present

## 2020-12-03 DIAGNOSIS — J449 Chronic obstructive pulmonary disease, unspecified: Secondary | ICD-10-CM | POA: Diagnosis not present

## 2020-12-03 DIAGNOSIS — L97421 Non-pressure chronic ulcer of left heel and midfoot limited to breakdown of skin: Secondary | ICD-10-CM | POA: Diagnosis not present

## 2020-12-03 DIAGNOSIS — A4101 Sepsis due to Methicillin susceptible Staphylococcus aureus: Secondary | ICD-10-CM | POA: Diagnosis not present

## 2020-12-03 DIAGNOSIS — E1142 Type 2 diabetes mellitus with diabetic polyneuropathy: Secondary | ICD-10-CM | POA: Diagnosis not present

## 2020-12-03 DIAGNOSIS — I110 Hypertensive heart disease with heart failure: Secondary | ICD-10-CM | POA: Diagnosis not present

## 2020-12-03 NOTE — Patient Instructions (Addendum)
Goals Addressed            This Visit's Progress   . Careful Skin Care-wound vac left heel   On track    Timeframe:  Short-Term Goal Priority:  High Start Date:   11/25/20                          Expected End Date:  01/24/21                     Follow Up Date 12/25/20  - clean and dry skin well  -keep wound vac intact -monitor for signs such as redness, swelling or pain to site   Why is this important?     Taking really good care of your skin will help to keep your skin unbroken.    Notes: 11/25/20 Patient reports wound vac intact with no redness or swelling.  12/03/20 Patient to wear wound vac for 2 more weeks.     . Make and Keep All Appointments   On track    Timeframe:  Long-Range Goal Priority:  High Start Date:    11/25/20                         Expected End Date:    05/27/21                   Follow Up Date 12/25/20   - arrange a ride through an agency 1 week before appointment - keep a calendar with appointment dates    Why is this important?    Part of staying healthy is seeing the doctor for follow-up care.   If you forget your appointments, there are some things you can do to stay on track.    Notes: Patient daughter to make follow up appointments today 11/25/20.  12/03/20 patient seeing physicians as scheduled.      . Monitor and Manage My Blood Sugar-Diabetes Type 2   Not on track    Timeframe:  Long-Range Goal Priority:  High Start Date:    11/25/20                         Expected End Date:   05/27/21                    Follow Up Date 12/25/20 - check blood sugar at prescribed times    Why is this important?    Checking your blood sugar at home helps to keep it from getting very high or very low.   Writing the results in a diary or log helps the doctor know how to care for you.   Your blood sugar log should have the time, date and the results.   Also, write down the amount of insulin or other medicine that you take.   Other information, like what you ate,  exercise done and how you were feeling, will also be helpful.     Notes: Patient currently not checking encouraged to check. 12/03/20 Patient not checking sugars currently. Encouraged patient to check sugars.  Wound Infection A wound infection happens when tiny organisms (microorganisms) start to grow in a wound. A wound infection is most often caused by bacteria. Infection can cause the wound to break open or worsen. Wound infection needs treatment. If a wound infection is left untreated, complications can occur. Untreated wound infections may lead to an infection in the bloodstream (septicemia) or a bone infection (osteomyelitis). What are the causes? This condition is most often caused by bacteria growing in a wound. Other microorganisms, like yeast and fungi, can also cause wound infections. What increases the risk? The following factors may make you more likely to develop this condition:  Having a weak body defense system (immune system).  Having diabetes.  Taking steroid medicines for a long time (chronic use).  Smoking.  Being an older person.  Being overweight.  Taking chemotherapy medicines. What are the signs or symptoms? Symptoms of this condition include:  Having more redness, swelling, or pain at the wound site.  Having more blood or fluid at the wound site.  A bad smell coming from a wound or bandage (dressing).  Having a fever.  Feeling tired or fatigued.  Having warmth at or around the wound.  Having pus at the wound site. How is this diagnosed? This condition is diagnosed with a medical history and physical exam. You may also have a wound culture or blood tests or both. How  is this treated? This condition is usually treated with an antibiotic medicine.  The infection should improve 24-48 hours after you start antibiotics.  After 24-48 hours, redness around the wound should stop spreading, and the wound should be less painful. Follow these instructions at home: Medicines  Take or apply over-the-counter and prescription medicines only as told by your health care provider.  If you were prescribed an antibiotic medicine, take or apply it as told by your health care provider. Do not stop using the antibiotic even if you start to feel better. Wound care  Clean the wound each day, or as told by your health care provider. ? Wash the wound with mild soap and water. ? Rinse the wound with water to remove all soap. ? Pat the wound dry with a clean towel. Do not rub it.  Follow instructions from your health care provider about how to take care of your wound. Make sure you: ? Wash your hands with soap and water before and after you change your dressing. If soap and water are not available, use hand sanitizer. ? Change your dressing as told by your health care provider. ? Leave stitches (sutures), skin glue, or adhesive strips in place if your wound has been closed. These skin closures may need to stay in place for 2 weeks or longer. If adhesive strip edges start to loosen and curl up, you may trim the loose edges. Do not remove adhesive strips completely unless your health care provider tells you to do that. Some wounds are left open to heal on their own.  Check your wound every day for signs of infection. Watch for: ? More redness, swelling, or pain. ? More fluid or blood. ? Warmth. ? Pus or a bad smell.   General instructions  Keep the dressing dry until your health care provider says it can be removed.  Do not take baths, swim, or use a hot tub until your health care provider approves. Ask your health care provider if you may take showers. You may only be allowed  to take sponge baths.  Raise (elevate) the injured area above the level of your heart while you are sitting or lying down.  Do not scratch or pick at the wound.  Keep all follow-up visits as told by your health care provider. This is important. Contact a health care provider if:  Your pain is not controlled with medicine.  You have more redness, swelling, or pain around your wound.  You have more fluid or blood coming from your wound.  Your wound feels warm to the touch.  You have pus coming from your wound.  You continue to notice a bad smell coming from your wound or your dressing.  Your wound that was closed breaks open. Get help right away if:  You have a red streak going away from your wound.  You have a fever. Summary  A wound infection happens when tiny organisms (microorganisms) start to grow in a wound.  This condition is usually treated with an antibiotic medicine.  Follow instructions from your health care provider about how to take care of your wound.  Contact a health care provider if your wound infection does not begin to improve in 24-48 hours, or your symptoms worsen.  Keep all follow-up visits as told by your health care provider. This is important. This information is not intended to replace advice given to you by your health care provider. Make sure you discuss any questions you have with your health care provider. Document Revised: 04/25/2018 Document Reviewed: 04/25/2018 Elsevier Patient Education  [place].

## 2020-12-03 NOTE — Patient Outreach (Signed)
[facility]) Care Management  [place]  12/03/2020   [name] 09-15-1951 [id number]  Subjective: Telephone call to patient for follow up. Patient reports doing good. He states he has to wear the wound vac 2 more weeks.  Encouraged patient to hang in there and follow physician advice.  He verbalized understanding. Patient states he still has not been checking his sugars.  He did not state why or if there were any barriers to his checking. Reiterated importance of checking sugars in order to keep sugars under control to promote wound healing. He verbalized understanding.    Objective:   Encounter Medications:  Outpatient Encounter Medications as of 12/03/2020  Medication Sig  . aspirin EC 81 MG tablet Take 81 mg by mouth daily.  . Calcium Carbonate-Vitamin D (CALCIUM PLUS VITAMIN D PO) Take 1 tablet by mouth daily.  . ciprofloxacin (CIPRO) 500 MG tablet Take 1 tablet (500 mg total) by mouth 2 (two) times daily for 14 days.  . clopidogrel (PLAVIX) 75 MG tablet Take 1 tablet (75 mg total) by mouth daily.  Marland Kitchen gabapentin (NEURONTIN) 300 MG capsule Take 1 capsule (300 mg total) by mouth 3 (three) times daily.  Marland Kitchen glucose blood (ACCU-CHEK GUIDE) test strip Use as instructed  . glyBURIDE (DIABETA) 5 MG tablet Take 1 tablet (5 mg total) by mouth 2 (two) times daily with a meal.  . Lancets (ACCU-CHEK SOFT TOUCH) lancets Use as instructed  . lovastatin (MEVACOR) 10 MG tablet Take 1 tab  Po QHS  . meloxicam (MOBIC) 15 MG tablet Take by mouth.  . mometasone-formoterol (DULERA) 100-5 MCG/ACT AERO Inhale 2 puffs into the lungs 2 (two) times daily as needed for wheezing.  . ondansetron (ZOFRAN-ODT) 4 MG disintegrating tablet Take 1 tablet (4 mg total) by mouth every 8 (eight) hours as needed for nausea or vomiting.  Marland Kitchen oxybutynin (DITROPAN) 5 MG tablet Take 1 tablet (5 mg total) by mouth 2 (two) times daily.  . traMADol (ULTRAM) 50 MG tablet TAKE 1 TABLET BY MOUTH EVERY 12 HOURS  ASNEEDED FOR PAIN  . vancomycin IVPB Inject 1,000 mg into the vein every 12 (twelve) hours. Indication:  Enterococcus and MSSA bacteremia, Left foot infection First Dose: Yes Last Day of Therapy:  12/28/20 Labs - Monday:  CBC/D, CMP, and vancomycin trough. Labs - Thursday:  BMP and vancomycin trough Remove PIC line with completion of antibiotics Method of administration:Elastomeric Method of administration may be changed at the discretion of the patient and/or caregiver's ability to self-administer the medication ordered.   No facility-administered encounter medications on file as of 12/03/2020.    Functional Status:  In your present state of health, do you have any difficulty performing the following activities: 11/25/2020 11/12/2020  Hearing? N N  Vision? N N  Difficulty concentrating or making decisions? N N  Walking or climbing stairs? Y Y  Comment weakness and foot wound -  Dressing or bathing? N N  Doing errands, shopping? N N  Preparing Food and eating ? N -  Using the Toilet? N -  In the past six months, have you accidently leaked urine? N -  Do you have problems with loss of bowel control? N -  Managing your Medications? N -  Managing your Finances? N -  Housekeeping or managing your Housekeeping? Y -  Comment daughter [name] helps -  Some recent data might be hidden    Fall/Depression Screening: Fall Risk  11/25/2020 09/22/2020 06/20/2020  Falls in the  past year? 0 0 0  Number falls in past yr: - - -  Injury with Fall? - - -   PHQ 2/9 Scores 11/25/2020 09/22/2020 06/20/2020 03/20/2020 12/17/2019 09/17/2019 06/15/2019  PHQ - 2 Score 0 0 0 0 0 0 0    Assessment: Wound making progress per patient.  Goals Addressed            This Visit's Progress   . Careful Skin Care-wound vac left heel   On track    Timeframe:  Short-Term Goal Priority:  High Start Date:   11/25/20                          Expected End Date:  01/24/21                     Follow Up Date 12/25/20  -  clean and dry skin well  -keep wound vac intact -monitor for signs such as redness, swelling or pain to site   Why is this important?     Taking really good care of your skin will help to keep your skin unbroken.    Notes: 11/25/20 Patient reports wound vac intact with no redness or swelling.  12/03/20 Patient to wear wound vac for 2 more weeks.     . Make and Keep All Appointments   On track    Timeframe:  Long-Range Goal Priority:  High Start Date:    11/25/20                         Expected End Date:    05/27/21                   Follow Up Date 12/25/20   - arrange a ride through an agency 1 week before appointment - keep a calendar with appointment dates    Why is this important?    Part of staying healthy is seeing the doctor for follow-up care.   If you forget your appointments, there are some things you can do to stay on track.    Notes: Patient daughter to make follow up appointments today 11/25/20.  12/03/20 patient seeing physicians as scheduled.      . Monitor and Manage My Blood Sugar-Diabetes Type 2   Not on track    Timeframe:  Long-Range Goal Priority:  High Start Date:    11/25/20                         Expected End Date:   05/27/21                    Follow Up Date 12/25/20 - check blood sugar at prescribed times    Why is this important?    Checking your blood sugar at home helps to keep it from getting very high or very low.   Writing the results in a diary or log helps the doctor know how to care for you.   Your blood sugar log should have the time, date and the results.   Also, write down the amount of insulin or other medicine that you take.   Other information, like what you ate, exercise done and how you were feeling, will also be helpful.     Notes: Patient currently not checking encouraged to check. 12/03/20 Patient not checking sugars currently. Encouraged patient to check  sugars.        Plan: RN CM will contact patient next week. Follow-up:   Patient agrees to Care Plan and Follow-up.   [name], RN, MSN [place] [facility] Care Management Coordinator Direct Line [phone] Cell [phone] Toll Free: [phone]  Fax: [phone]

## 2020-12-04 DIAGNOSIS — I110 Hypertensive heart disease with heart failure: Secondary | ICD-10-CM | POA: Diagnosis not present

## 2020-12-04 DIAGNOSIS — E1151 Type 2 diabetes mellitus with diabetic peripheral angiopathy without gangrene: Secondary | ICD-10-CM | POA: Diagnosis not present

## 2020-12-04 DIAGNOSIS — L97421 Non-pressure chronic ulcer of left heel and midfoot limited to breakdown of skin: Secondary | ICD-10-CM | POA: Diagnosis not present

## 2020-12-04 DIAGNOSIS — M19011 Primary osteoarthritis, right shoulder: Secondary | ICD-10-CM | POA: Diagnosis not present

## 2020-12-04 DIAGNOSIS — I509 Heart failure, unspecified: Secondary | ICD-10-CM | POA: Diagnosis not present

## 2020-12-04 DIAGNOSIS — E11621 Type 2 diabetes mellitus with foot ulcer: Secondary | ICD-10-CM | POA: Diagnosis not present

## 2020-12-04 DIAGNOSIS — D649 Anemia, unspecified: Secondary | ICD-10-CM | POA: Diagnosis not present

## 2020-12-04 DIAGNOSIS — J449 Chronic obstructive pulmonary disease, unspecified: Secondary | ICD-10-CM | POA: Diagnosis not present

## 2020-12-04 DIAGNOSIS — E1142 Type 2 diabetes mellitus with diabetic polyneuropathy: Secondary | ICD-10-CM | POA: Diagnosis not present

## 2020-12-04 DIAGNOSIS — A4101 Sepsis due to Methicillin susceptible Staphylococcus aureus: Secondary | ICD-10-CM | POA: Diagnosis not present

## 2020-12-05 ENCOUNTER — Other Ambulatory Visit: Payer: Self-pay | Admitting: Nurse Practitioner

## 2020-12-05 ENCOUNTER — Other Ambulatory Visit: Payer: Self-pay | Admitting: Hospice and Palliative Medicine

## 2020-12-05 DIAGNOSIS — M19012 Primary osteoarthritis, left shoulder: Secondary | ICD-10-CM

## 2020-12-05 DIAGNOSIS — J449 Chronic obstructive pulmonary disease, unspecified: Secondary | ICD-10-CM | POA: Diagnosis not present

## 2020-12-05 DIAGNOSIS — E1142 Type 2 diabetes mellitus with diabetic polyneuropathy: Secondary | ICD-10-CM | POA: Diagnosis not present

## 2020-12-05 DIAGNOSIS — E1151 Type 2 diabetes mellitus with diabetic peripheral angiopathy without gangrene: Secondary | ICD-10-CM | POA: Diagnosis not present

## 2020-12-05 DIAGNOSIS — A4101 Sepsis due to Methicillin susceptible Staphylococcus aureus: Secondary | ICD-10-CM | POA: Diagnosis not present

## 2020-12-05 DIAGNOSIS — I110 Hypertensive heart disease with heart failure: Secondary | ICD-10-CM | POA: Diagnosis not present

## 2020-12-05 DIAGNOSIS — E11621 Type 2 diabetes mellitus with foot ulcer: Secondary | ICD-10-CM | POA: Diagnosis not present

## 2020-12-05 DIAGNOSIS — I509 Heart failure, unspecified: Secondary | ICD-10-CM | POA: Diagnosis not present

## 2020-12-05 DIAGNOSIS — L97421 Non-pressure chronic ulcer of left heel and midfoot limited to breakdown of skin: Secondary | ICD-10-CM | POA: Diagnosis not present

## 2020-12-05 DIAGNOSIS — M19011 Primary osteoarthritis, right shoulder: Secondary | ICD-10-CM | POA: Diagnosis not present

## 2020-12-05 MED ORDER — TRAMADOL HCL 50 MG PO TABS: ORAL_TABLET | ORAL | 2 refills | Status: DC

## 2020-12-06 DIAGNOSIS — A419 Sepsis, unspecified organism: Secondary | ICD-10-CM | POA: Diagnosis not present

## 2020-12-08 DIAGNOSIS — I509 Heart failure, unspecified: Secondary | ICD-10-CM | POA: Diagnosis not present

## 2020-12-08 DIAGNOSIS — I110 Hypertensive heart disease with heart failure: Secondary | ICD-10-CM | POA: Diagnosis not present

## 2020-12-08 DIAGNOSIS — E1151 Type 2 diabetes mellitus with diabetic peripheral angiopathy without gangrene: Secondary | ICD-10-CM | POA: Diagnosis not present

## 2020-12-08 DIAGNOSIS — E1142 Type 2 diabetes mellitus with diabetic polyneuropathy: Secondary | ICD-10-CM | POA: Diagnosis not present

## 2020-12-08 DIAGNOSIS — A4101 Sepsis due to Methicillin susceptible Staphylococcus aureus: Secondary | ICD-10-CM | POA: Diagnosis not present

## 2020-12-08 DIAGNOSIS — L97421 Non-pressure chronic ulcer of left heel and midfoot limited to breakdown of skin: Secondary | ICD-10-CM | POA: Diagnosis not present

## 2020-12-08 DIAGNOSIS — J449 Chronic obstructive pulmonary disease, unspecified: Secondary | ICD-10-CM | POA: Diagnosis not present

## 2020-12-08 DIAGNOSIS — M19011 Primary osteoarthritis, right shoulder: Secondary | ICD-10-CM | POA: Diagnosis not present

## 2020-12-08 DIAGNOSIS — E11621 Type 2 diabetes mellitus with foot ulcer: Secondary | ICD-10-CM | POA: Diagnosis not present

## 2020-12-09 ENCOUNTER — Other Ambulatory Visit: Payer: Self-pay

## 2020-12-09 NOTE — Patient Outreach (Signed)
[facility]) Care Management  [place]  12/09/2020   [name] Jul 15, 1951 [id number]  Subjective: Weekly call to patient.  He reports that he is hanging in there. He states that the wound nurse states that his left heel wound is better.  He states he continues with the IV vancomycin. Denies any signs of infection.  Patient has follow up with physician next week.  He voices no concerns.     Objective:   Encounter Medications:  Outpatient Encounter Medications as of 12/09/2020  Medication Sig  . aspirin EC 81 MG tablet Take 81 mg by mouth daily.  . Calcium Carbonate-Vitamin D (CALCIUM PLUS VITAMIN D PO) Take 1 tablet by mouth daily.  . clopidogrel (PLAVIX) 75 MG tablet Take 1 tablet (75 mg total) by mouth daily.  Marland Kitchen gabapentin (NEURONTIN) 300 MG capsule Take 1 capsule (300 mg total) by mouth 3 (three) times daily.  Marland Kitchen glucose blood (ACCU-CHEK GUIDE) test strip Use as instructed  . glyBURIDE (DIABETA) 5 MG tablet Take 1 tablet (5 mg total) by mouth 2 (two) times daily with a meal.  . Lancets (ACCU-CHEK SOFT TOUCH) lancets Use as instructed  . lovastatin (MEVACOR) 10 MG tablet Take 1 tab  Po QHS  . meloxicam (MOBIC) 15 MG tablet Take by mouth.  . mometasone-formoterol (DULERA) 100-5 MCG/ACT AERO Inhale 2 puffs into the lungs 2 (two) times daily as needed for wheezing.  . ondansetron (ZOFRAN-ODT) 4 MG disintegrating tablet Take 1 tablet (4 mg total) by mouth every 8 (eight) hours as needed for nausea or vomiting.  Marland Kitchen oxybutynin (DITROPAN) 5 MG tablet Take 1 tablet (5 mg total) by mouth 2 (two) times daily.  . traMADol (ULTRAM) 50 MG tablet TAKE 1 TABLET BY MOUTH EVERY 12 HOURS ASNEEDED FOR PAIN  . vancomycin IVPB Inject 1,000 mg into the vein every 12 (twelve) hours. Indication:  Enterococcus and MSSA bacteremia, Left foot infection First Dose: Yes Last Day of Therapy:  12/28/20 Labs - Monday:  CBC/D, CMP, and vancomycin trough. Labs - Thursday:  BMP and  vancomycin trough Remove PIC line with completion of antibiotics Method of administration:Elastomeric Method of administration may be changed at the discretion of the patient and/or caregiver's ability to self-administer the medication ordered.   No facility-administered encounter medications on file as of 12/09/2020.    Functional Status:  In your present state of health, do you have any difficulty performing the following activities: 11/25/2020 11/12/2020  Hearing? N N  Vision? N N  Difficulty concentrating or making decisions? N N  Walking or climbing stairs? Y Y  Comment weakness and foot wound -  Dressing or bathing? N N  Doing errands, shopping? N N  Preparing Food and eating ? N -  Using the Toilet? N -  In the past six months, have you accidently leaked urine? N -  Do you have problems with loss of bowel control? N -  Managing your Medications? N -  Managing your Finances? N -  Housekeeping or managing your Housekeeping? Y -  Comment daughter [name] helps -  Some recent data might be hidden    Fall/Depression Screening: Fall Risk  11/25/2020 09/22/2020 06/20/2020  Falls in the past year? 0 0 0  Number falls in past yr: - - -  Injury with Fall? - - -   PHQ 2/9 Scores 11/25/2020 09/22/2020 06/20/2020 03/20/2020 12/17/2019 09/17/2019 06/15/2019  PHQ - 2 Score 0 0 0 0 0 0 0  Assessment:  Goals Addressed            This Visit's Progress   . Careful Skin Care-wound vac left heel   On track    Timeframe:  Short-Term Goal Priority:  High Start Date:   11/25/20                          Expected End Date:  02/23/21                     Follow Up Date 12/25/20  - clean and dry skin well  -keep wound vac intact -monitor for signs such as redness, swelling or pain to site   Why is this important?     Taking really good care of your skin will help to keep your skin unbroken.    Notes: 11/25/20 Patient reports wound vac intact with no redness or swelling.  12/03/20 Patient to wear  wound vac for 2 more weeks. 12/09/20 Wound making progress per patient    . Make and Keep All Appointments   On track    Timeframe:  Long-Range Goal Priority:  High Start Date:    11/25/20                         Expected End Date:    05/27/21                   Follow Up Date 12/25/20   - ask family or friend for a ride    Why is this important?    Part of staying healthy is seeing the doctor for follow-up care.   If you forget your appointments, there are some things you can do to stay on track.    Notes: Patient daughter to make follow up appointments today 11/25/20.  12/03/20 patient seeing physicians as scheduled.  12/09/20 Patient has no problems with transportation.     . Monitor and Manage My Blood Sugar-Diabetes Type 2   On track    Timeframe:  Long-Range Goal Priority:  High Start Date:    11/25/20                         Expected End Date:   05/27/21                    Follow Up Date 12/25/20 - check blood sugar at prescribed times    Why is this important?    Checking your blood sugar at home helps to keep it from getting very high or very low.   Writing the results in a diary or log helps the doctor know how to care for you.   Your blood sugar log should have the time, date and the results.   Also, write down the amount of insulin or other medicine that you take.   Other information, like what you ate, exercise done and how you were feeling, will also be helpful.     Notes: Patient currently not checking encouraged to check. 12/03/20 Patient not checking sugars currently. Encouraged patient to check sugars. 12/09/20 Patient checked blood sugar it was 223.         Plan: RN CM will contact again this month. Follow-up:  Patient agrees to Care Plan and Follow-up.   [name], RN, MSN [facility] Care Management Care Management Coordinator Direct Line [phone] Cell [phone]  Toll Free: [phone]  Fax: [phone]

## 2020-12-10 DIAGNOSIS — E11621 Type 2 diabetes mellitus with foot ulcer: Secondary | ICD-10-CM | POA: Diagnosis not present

## 2020-12-10 DIAGNOSIS — M19011 Primary osteoarthritis, right shoulder: Secondary | ICD-10-CM | POA: Diagnosis not present

## 2020-12-10 DIAGNOSIS — E1151 Type 2 diabetes mellitus with diabetic peripheral angiopathy without gangrene: Secondary | ICD-10-CM | POA: Diagnosis not present

## 2020-12-10 DIAGNOSIS — A4101 Sepsis due to Methicillin susceptible Staphylococcus aureus: Secondary | ICD-10-CM | POA: Diagnosis not present

## 2020-12-10 DIAGNOSIS — J449 Chronic obstructive pulmonary disease, unspecified: Secondary | ICD-10-CM | POA: Diagnosis not present

## 2020-12-10 DIAGNOSIS — E1142 Type 2 diabetes mellitus with diabetic polyneuropathy: Secondary | ICD-10-CM | POA: Diagnosis not present

## 2020-12-10 DIAGNOSIS — L97421 Non-pressure chronic ulcer of left heel and midfoot limited to breakdown of skin: Secondary | ICD-10-CM | POA: Diagnosis not present

## 2020-12-10 DIAGNOSIS — I110 Hypertensive heart disease with heart failure: Secondary | ICD-10-CM | POA: Diagnosis not present

## 2020-12-10 DIAGNOSIS — I509 Heart failure, unspecified: Secondary | ICD-10-CM | POA: Diagnosis not present

## 2020-12-11 DIAGNOSIS — L97421 Non-pressure chronic ulcer of left heel and midfoot limited to breakdown of skin: Secondary | ICD-10-CM | POA: Diagnosis not present

## 2020-12-11 DIAGNOSIS — M19011 Primary osteoarthritis, right shoulder: Secondary | ICD-10-CM | POA: Diagnosis not present

## 2020-12-11 DIAGNOSIS — E1142 Type 2 diabetes mellitus with diabetic polyneuropathy: Secondary | ICD-10-CM | POA: Diagnosis not present

## 2020-12-11 DIAGNOSIS — I509 Heart failure, unspecified: Secondary | ICD-10-CM | POA: Diagnosis not present

## 2020-12-11 DIAGNOSIS — I110 Hypertensive heart disease with heart failure: Secondary | ICD-10-CM | POA: Diagnosis not present

## 2020-12-11 DIAGNOSIS — J449 Chronic obstructive pulmonary disease, unspecified: Secondary | ICD-10-CM | POA: Diagnosis not present

## 2020-12-11 DIAGNOSIS — E11621 Type 2 diabetes mellitus with foot ulcer: Secondary | ICD-10-CM | POA: Diagnosis not present

## 2020-12-11 DIAGNOSIS — E1151 Type 2 diabetes mellitus with diabetic peripheral angiopathy without gangrene: Secondary | ICD-10-CM | POA: Diagnosis not present

## 2020-12-11 DIAGNOSIS — E11628 Type 2 diabetes mellitus with other skin complications: Secondary | ICD-10-CM | POA: Diagnosis not present

## 2020-12-11 DIAGNOSIS — A4101 Sepsis due to Methicillin susceptible Staphylococcus aureus: Secondary | ICD-10-CM | POA: Diagnosis not present

## 2020-12-12 DIAGNOSIS — E1142 Type 2 diabetes mellitus with diabetic polyneuropathy: Secondary | ICD-10-CM | POA: Diagnosis not present

## 2020-12-12 DIAGNOSIS — A419 Sepsis, unspecified organism: Secondary | ICD-10-CM | POA: Diagnosis not present

## 2020-12-12 DIAGNOSIS — I509 Heart failure, unspecified: Secondary | ICD-10-CM | POA: Diagnosis not present

## 2020-12-12 DIAGNOSIS — M19011 Primary osteoarthritis, right shoulder: Secondary | ICD-10-CM | POA: Diagnosis not present

## 2020-12-12 DIAGNOSIS — E11621 Type 2 diabetes mellitus with foot ulcer: Secondary | ICD-10-CM | POA: Diagnosis not present

## 2020-12-12 DIAGNOSIS — L97421 Non-pressure chronic ulcer of left heel and midfoot limited to breakdown of skin: Secondary | ICD-10-CM | POA: Diagnosis not present

## 2020-12-12 DIAGNOSIS — A4101 Sepsis due to Methicillin susceptible Staphylococcus aureus: Secondary | ICD-10-CM | POA: Diagnosis not present

## 2020-12-12 DIAGNOSIS — I110 Hypertensive heart disease with heart failure: Secondary | ICD-10-CM | POA: Diagnosis not present

## 2020-12-12 DIAGNOSIS — E1151 Type 2 diabetes mellitus with diabetic peripheral angiopathy without gangrene: Secondary | ICD-10-CM | POA: Diagnosis not present

## 2020-12-12 DIAGNOSIS — J449 Chronic obstructive pulmonary disease, unspecified: Secondary | ICD-10-CM | POA: Diagnosis not present

## 2020-12-15 DIAGNOSIS — M19011 Primary osteoarthritis, right shoulder: Secondary | ICD-10-CM | POA: Diagnosis not present

## 2020-12-15 DIAGNOSIS — J449 Chronic obstructive pulmonary disease, unspecified: Secondary | ICD-10-CM | POA: Diagnosis not present

## 2020-12-15 DIAGNOSIS — E1151 Type 2 diabetes mellitus with diabetic peripheral angiopathy without gangrene: Secondary | ICD-10-CM | POA: Diagnosis not present

## 2020-12-15 DIAGNOSIS — E1142 Type 2 diabetes mellitus with diabetic polyneuropathy: Secondary | ICD-10-CM | POA: Diagnosis not present

## 2020-12-15 DIAGNOSIS — E11621 Type 2 diabetes mellitus with foot ulcer: Secondary | ICD-10-CM | POA: Diagnosis not present

## 2020-12-15 DIAGNOSIS — I509 Heart failure, unspecified: Secondary | ICD-10-CM | POA: Diagnosis not present

## 2020-12-15 DIAGNOSIS — L97421 Non-pressure chronic ulcer of left heel and midfoot limited to breakdown of skin: Secondary | ICD-10-CM | POA: Diagnosis not present

## 2020-12-15 DIAGNOSIS — I110 Hypertensive heart disease with heart failure: Secondary | ICD-10-CM | POA: Diagnosis not present

## 2020-12-15 DIAGNOSIS — A4101 Sepsis due to Methicillin susceptible Staphylococcus aureus: Secondary | ICD-10-CM | POA: Diagnosis not present

## 2020-12-16 ENCOUNTER — Ambulatory Visit: Payer: Medicare HMO | Attending: Infectious Diseases | Admitting: Infectious Diseases

## 2020-12-16 ENCOUNTER — Other Ambulatory Visit: Payer: Self-pay

## 2020-12-16 ENCOUNTER — Encounter: Payer: Self-pay | Admitting: Infectious Diseases

## 2020-12-16 VITALS — BP 85/46 | HR 62 | Temp 98.2°F

## 2020-12-16 DIAGNOSIS — Z87891 Personal history of nicotine dependence: Secondary | ICD-10-CM | POA: Diagnosis not present

## 2020-12-16 DIAGNOSIS — I428 Other cardiomyopathies: Secondary | ICD-10-CM | POA: Insufficient documentation

## 2020-12-16 DIAGNOSIS — B9561 Methicillin susceptible Staphylococcus aureus infection as the cause of diseases classified elsewhere: Secondary | ICD-10-CM | POA: Diagnosis not present

## 2020-12-16 DIAGNOSIS — Z881 Allergy status to other antibiotic agents status: Secondary | ICD-10-CM | POA: Diagnosis not present

## 2020-12-16 DIAGNOSIS — Z792 Long term (current) use of antibiotics: Secondary | ICD-10-CM | POA: Diagnosis not present

## 2020-12-16 DIAGNOSIS — Z7902 Long term (current) use of antithrombotics/antiplatelets: Secondary | ICD-10-CM | POA: Diagnosis not present

## 2020-12-16 DIAGNOSIS — Z880 Allergy status to penicillin: Secondary | ICD-10-CM | POA: Diagnosis not present

## 2020-12-16 DIAGNOSIS — T8744 Infection of amputation stump, left lower extremity: Secondary | ICD-10-CM | POA: Diagnosis not present

## 2020-12-16 DIAGNOSIS — Z95828 Presence of other vascular implants and grafts: Secondary | ICD-10-CM | POA: Diagnosis not present

## 2020-12-16 DIAGNOSIS — B952 Enterococcus as the cause of diseases classified elsewhere: Secondary | ICD-10-CM

## 2020-12-16 DIAGNOSIS — E11621 Type 2 diabetes mellitus with foot ulcer: Secondary | ICD-10-CM | POA: Diagnosis not present

## 2020-12-16 DIAGNOSIS — Y838 Other surgical procedures as the cause of abnormal reaction of the patient, or of later complication, without mention of misadventure at the time of the procedure: Secondary | ICD-10-CM | POA: Diagnosis not present

## 2020-12-16 DIAGNOSIS — R031 Nonspecific low blood-pressure reading: Secondary | ICD-10-CM

## 2020-12-16 DIAGNOSIS — Z7982 Long term (current) use of aspirin: Secondary | ICD-10-CM | POA: Diagnosis not present

## 2020-12-16 DIAGNOSIS — J449 Chronic obstructive pulmonary disease, unspecified: Secondary | ICD-10-CM | POA: Insufficient documentation

## 2020-12-16 DIAGNOSIS — R7881 Bacteremia: Secondary | ICD-10-CM | POA: Diagnosis not present

## 2020-12-16 DIAGNOSIS — Z791 Long term (current) use of non-steroidal anti-inflammatories (NSAID): Secondary | ICD-10-CM | POA: Insufficient documentation

## 2020-12-16 DIAGNOSIS — Z7984 Long term (current) use of oral hypoglycemic drugs: Secondary | ICD-10-CM | POA: Diagnosis not present

## 2020-12-16 DIAGNOSIS — Z79899 Other long term (current) drug therapy: Secondary | ICD-10-CM | POA: Diagnosis not present

## 2020-12-16 DIAGNOSIS — Z7951 Long term (current) use of inhaled steroids: Secondary | ICD-10-CM | POA: Insufficient documentation

## 2020-12-16 DIAGNOSIS — L089 Local infection of the skin and subcutaneous tissue, unspecified: Secondary | ICD-10-CM

## 2020-12-16 DIAGNOSIS — E1151 Type 2 diabetes mellitus with diabetic peripheral angiopathy without gangrene: Secondary | ICD-10-CM | POA: Insufficient documentation

## 2020-12-16 DIAGNOSIS — L97421 Non-pressure chronic ulcer of left heel and midfoot limited to breakdown of skin: Secondary | ICD-10-CM | POA: Diagnosis not present

## 2020-12-16 DIAGNOSIS — E1142 Type 2 diabetes mellitus with diabetic polyneuropathy: Secondary | ICD-10-CM | POA: Diagnosis not present

## 2020-12-16 NOTE — Progress Notes (Signed)
[name]  DOB: 1951-04-28  MRN: [id number]  Date/Time: 12/16/2020 9:35 AM   Subjective:  Follow up visit after recent hospitalization between  11/11/20-11/24/20 for left foot infection and bacteremia with enterococcus and MSSA. Is here with his step daughter 70 y.o. male with a history of peripheral vascular disease, toe amputation on the left foot congenital infection, COPD, diabetes mellitus, NICM Doing better Taking iv vanco every day No fever, chills, nausea, vomiting or diarrhea While in the hosp he had persistent enterococcal  bacteremia for which he had TEE and endocarditis ruled out. He also had imaging of the thoracic and lumbar spine and no infection noted. It was thought that daptomycin was not as effective as vanco and so he was switched back to Iv vancomycin. He underwent debridement of the left heel eschar- Culture priro to sugery from the wound was enterococcus and MSSA- during surgery culture that was sent from the wound grew MSSA and pseudomonas, bone culture had no bacteria or WBC on the gram stain , but MSSa and pseudomonas in culture deemed to be contamination from the wound  . bone biopsy did not show any osteomyelitis.Marland Kitchen He was discharged on Iv vancomycin for a total of 6 weeks to treat the bacteremia and the foot wound and Po cipro for 2 weeks to treat the wound   He saw Dr.Fowler as OP and he is happy with the wound healing progress. Still has wound vac   Past Medical History:  Diagnosis Date  . Cholecystitis, chronic   . Cholelithiasis   . COPD (chronic obstructive pulmonary disease) ([place])   . Diabetes mellitus without complication ([place])   . Diabetic retinopathy ([place])   . Dyspnea   . Fatty liver 2008  . Fracture of clavicle 1992   left   . Fracture of ribs, multiple 1992   3 ribs  . GERD (gastroesophageal reflux disease)   . Hyperlipidemia   . Hypertension   . Paroxysmal SVT (supraventricular tachycardia) ([place])   . Pelvis fracture ([place]) 1992  .  Peripheral vascular disease ([place])   . Pleurisy   . Pleurisy   . Seasonal allergies     Past Surgical History:  Procedure Laterality Date  . AMPUTATION TOE Left 02/25/2017   Procedure: AMPUTATION TOE-LEFT 2ND MPJ;  Surgeon: Samara Deist, DPM;  Location: [facility] ORS;  Service: Podiatry;  Laterality: Left;  . AMPUTATION TOE Left 10/20/2018   Procedure: TOE MPJ T2 LEFT;  Surgeon: Samara Deist, DPM;  Location: [facility] OR[facility];  Service: Podiatry;  Laterality: Left;  . AMPUTATION TOE Left 01/12/2019   Procedure: AMPUTATION LEFT 5TH TOE AND JOINT;  Surgeon: Samara Deist, DPM;  Location: [facility] OR[facility];  Service: Podiatry;  Laterality: Left;  . BACK SURGERY  2008  . BONE BIOPSY Left 11/21/2020   Procedure: BONE BIOPSY;  Surgeon: Samara Deist, DPM;  Location: [facility];  Service: Podiatry;  Laterality: Left;  . CHOLECYSTECTOMY    . COLONOSCOPY WITH PROPOFOL N/A 08/15/2017   Procedure: COLONOSCOPY WITH PROPOFOL;  Surgeon: Lollie Sails, MD;  Location: [facility] ENDOSCOPY;  Service: Endoscopy;  Laterality: N/A;  . ENDARTERECTOMY Right 05/12/2017   Procedure: ENDARTERECTOMY CAROTID;  Surgeon: [name], MD;  Location: [facility] ORS;  Service: Vascular;  Laterality: Right;  . ENDARTERECTOMY FEMORAL Left 12/01/2018   Procedure: ENDARTERECTOMY FEMORAL;  Surgeon: [name], MD;  Location: [facility] OR[facility];  Service: Vascular;  Laterality: Left;  . ERCP    . FOOT SURGERY Right    x 3  .  IRRIGATION AND DEBRIDEMENT FOOT Left 11/21/2020   Procedure: IRRIGATION AND DEBRIDEMENT FOOT--with placement of wound vac;  Surgeon: Samara Deist, DPM;  Location: [facility] OR[facility];  Service: Podiatry;  Laterality: Left;  . LOWER EXTREMITY ANGIOGRAM Left 12/01/2018   Procedure: LOWER EXTREMITY ANGIOGRAM;  Surgeon: [name], MD;  Location: [facility];  Service: Vascular;  Laterality: Left;  . LOWER EXTREMITY ANGIOGRAPHY Left 10/30/2018   Procedure: LOWER EXTREMITY ANGIOGRAPHY;  Surgeon: [name], MD;  Location: [place] [facility];  Service:  Cardiovascular;  Laterality: Left;  . LOWER EXTREMITY ANGIOGRAPHY Left 11/30/2018   Procedure: LOWER EXTREMITY ANGIOGRAPHY;  Surgeon: [name], MD;  Location: [place] [facility];  Service: Cardiovascular;  Laterality: Left;  . LOWER EXTREMITY ANGIOGRAPHY Left 11/17/2020   Procedure: Lower Extremity Angiography;  Surgeon: [name], MD;  Location: [place] [facility];  Service: Cardiovascular;  Laterality: Left;  . TEE WITHOUT CARDIOVERSION N/A 11/14/2020   Procedure: TRANSESOPHAGEAL ECHOCARDIOGRAM (TEE);  Surgeon: Corey Skains, MD;  Location: [facility] ORS;  Service: Cardiovascular;  Laterality: N/A;    Social History   Socioeconomic History  . Marital status: Single    Spouse name: Not on file  . Number of children: Not on file  . Years of education: Not on file  . Highest education level: Not on file  Occupational History  . Not on file  Tobacco Use  . Smoking status: Former Smoker    Quit date: 02/24/1997    Years since quitting: 23.8  . Smokeless tobacco: Never Used  Vaping Use  . Vaping Use: Never used  Substance and Sexual Activity  . Alcohol use: No  . Drug use: No  . Sexual activity: Not on file  Other Topics Concern  . Not on file  Social History Narrative  . Not on file   Social Determinants of Health   Financial Resource Strain: Not on file  Food Insecurity: No Food Insecurity  . Worried About Charity fundraiser in the Last Year: Never true  . Ran Out of Food in the Last Year: Never true  Transportation Needs: No Transportation Needs  . Lack of Transportation (Medical): No  . Lack of Transportation (Non-Medical): No  Physical Activity: Not on file  Stress: Not on file  Social Connections: Not on file  Intimate Partner Violence: Not on file    Family History  Problem Relation Age of Onset  . Diabetes Sister    Allergies  Allergen Reactions  . Penicillins Rash and Other (See Comments)    Rash in and around the mouth Has patient had a PCN reaction  causing immediate rash, facial/tongue/throat swelling, SOB or lightheadedness with hypotension: Yes Has patient had a PCN reaction causing severe rash involving mucus membranes or skin necrosis: Yes Has patient had a PCN reaction that required hospitalization: No Has patient had a PCN reaction occurring within the last 10 years: Yes If all of the above answers are "NO", then may proceed with Cephalosporin use.   . Bactrim [Sulfamethoxazole-Trimethoprim] Other (See Comments)    Mouth sores, Sores develop in mouth   I? Current Outpatient Medications  Medication Sig Dispense Refill  . aspirin EC 81 MG tablet Take 81 mg by mouth daily.    . Calcium Carbonate-Vitamin D (CALCIUM PLUS VITAMIN D PO) Take 1 tablet by mouth daily.    . clopidogrel (PLAVIX) 75 MG tablet Take 1 tablet (75 mg total) by mouth daily. 90 tablet 3  . gabapentin (NEURONTIN) 300  MG capsule Take 1 capsule (300 mg total) by mouth 3 (three) times daily. 90 capsule 1  . glucose blood (ACCU-CHEK GUIDE) test strip Use as instructed 100 each 12  . glyBURIDE (DIABETA) 5 MG tablet Take 1 tablet (5 mg total) by mouth 2 (two) times daily with a meal. 180 tablet 1  . Lancets (ACCU-CHEK SOFT TOUCH) lancets Use as instructed 100 each 12  . lovastatin (MEVACOR) 10 MG tablet Take 1 tab  Po QHS 90 tablet 1  . meloxicam (MOBIC) 15 MG tablet Take by mouth.    . mometasone-formoterol (DULERA) 100-5 MCG/ACT AERO Inhale 2 puffs into the lungs 2 (two) times daily as needed for wheezing.    . ondansetron (ZOFRAN-ODT) 4 MG disintegrating tablet Take 1 tablet (4 mg total) by mouth every 8 (eight) hours as needed for nausea or vomiting. 60 tablet 1  . oxybutynin (DITROPAN) 5 MG tablet Take 1 tablet (5 mg total) by mouth 2 (two) times daily. 180 tablet 0  . traMADol (ULTRAM) 50 MG tablet TAKE 1 TABLET BY MOUTH EVERY 12 HOURS ASNEEDED FOR PAIN 45 tablet 2  . vancomycin IVPB Inject 1,000 mg into the vein every 12 (twelve) hours. Indication:  Enterococcus  and MSSA bacteremia, Left foot infection First Dose: Yes Last Day of Therapy:  12/28/20 Labs - Monday:  CBC/D, CMP, and vancomycin trough. Labs - Thursday:  BMP and vancomycin trough Remove PIC line with completion of antibiotics Method of administration:Elastomeric Method of administration may be changed at the discretion of the patient and/or caregiver's ability to self-administer the medication ordered. 74 Units 0   No current facility-administered medications for this visit.     Abtx:  Anti-infectives (From admission, onward)   None      REVIEW OF SYSTEMS:  Const: negative fever, negative chills,  Eyes: negative diplopia or visual changes, negative eye pain ENT: negative coryza, negative sore throat Resp: negative cough, hemoptysis, dyspnea Cards: negative for chest pain, palpitations, lower extremity edema GU: negative for frequency, dysuria and hematuria GI: Negative for abdominal pain, diarrhea, bleeding, constipation Skin: negative for rash and pruritus Heme: negative for easy bruising and gum/nose bleeding MS: some fatigue Neurolo:negative for headaches, dizziness, vertigo, memory problems  Psych: negative for feelings of anxiety, depression  Endocrine:  diabetes Allergy/Immunology-as above: Objective:  VITALS:  BP (!) 85/46   Pulse 62   Temp 98.2 F (36.8 C) (Oral)  PHYSICAL EXAM:  General: Alert, cooperative, no distress, appears stated age. In wheel chair Head: Normocephalic, without obvious abnormality, atraumatic. Eyes: Conjunctivae clear, anicteric sclerae. Pupils are equal Neck: Supple, symmetrical, no adenopathy, thyroid: non tender no carotid bruit and no JVD. Lungs: b/l air entry - clear Heart: Regular rate and rhythm, no murmur, rub or gallop. Abdomen: examination limited as in wheel chair Left heel Wound healing well :    Skin: No rashes or lesions. Or bruising Lymph: Cervical, supraclavicular normal. Neurologic: Grossly non-focal Pertinent  Labs Lab Results CBC    Component Value Date/Time   WBC 4.9 11/23/2020 0502   RBC 3.70 (L) 11/23/2020 0502   HGB 10.3 (L) 11/23/2020 0502   HGB 15.2 04/17/2014 1026   HCT 32.0 (L) 11/23/2020 0502   HCT 44.6 04/17/2014 1026   PLT 146 (L) 11/23/2020 0502   PLT 127 (L) 04/17/2014 1026   MCV 86.5 11/23/2020 0502   MCV 90 04/17/2014 1026   MCH 27.8 11/23/2020 0502   MCHC 32.2 11/23/2020 0502   RDW 14.3 11/23/2020 0502   RDW  13.3 04/17/2014 1026   LYMPHSABS 1.3 10/18/2018 0959   LYMPHSABS 1.3 04/17/2014 1026   MONOABS 0.6 10/18/2018 0959   MONOABS 0.4 04/17/2014 1026   EOSABS 0.2 10/18/2018 0959   EOSABS 0.2 04/17/2014 1026   BASOSABS 0.1 10/18/2018 0959   BASOSABS 0.1 04/17/2014 1026    CMP Latest Ref Rng & Units 11/23/2020 11/20/2020 11/20/2020  Glucose 70 - 99 mg/dL - - -  BUN 8 - 23 mg/dL - - -  Creatinine 0.61 - 1.24 mg/dL 0.92 0.92 1.01  Sodium 135 - 145 mmol/L - - -  Potassium 3.5 - 5.1 mmol/L - - -  Chloride 98 - 111 mmol/L - - -  CO2 22 - 32 mmol/L - - -  Calcium 8.9 - 10.3 mg/dL - - -  Total Protein 6.5 - 8.1 g/dL - - -  Total Bilirubin 0.3 - 1.2 mg/dL - - -  Alkaline Phos 38 - 126 U/L - - -  AST 15 - 41 U/L - - -  ALT 0 - 44 U/L - - -      Microbiology: 11/11/2020 4 out of 4 blood culture positive for Enterococcus faecalis and staph aureus 11/12/2020 1 out of 4 bottle Enterococcus faecalis 11/12/2020 wound culture has Enterococcus and staph aureus and Prevotella brevia.  11/14/2020 blood culture no growth   11/15/2020 blood culture 3 out of 4 bottles Enterococcus faecalis  11/16/2020 4 out of 4 bottles Enterococcus faecalis. 2/22/2022BCNG 11/20/20 BC- NG 11/21/20 Tissue culture pseudomonas and staph aureus  Bone pathology- no osteomyelitis  Impression/Recommendation Enterococcus bacteremia and MSSA bacteremia thought to be from the foot wound , though there was no osteomyelitis of the calcaneum Because the enterococcus was persistent in blood , he  had TEE, imaging of spine which were negative for any infection- He is getting 6 weeks of IV vancomycin. Will finish on 12/28/20 and PICC will be removed- we can do a blood culture 14 days after completion of antibiotic. PICC can be removed after that. Currently do not anticipate any oral antibiotics following that as the foot wound is progressing well? vancotrough and creatinone being monitored- last cr < 1- trough has been fluctuating and dose is adjusted to 1750mg  Q 12 as the last trough was 7.8 [facility] pharmacist monitoring trough twice a week. Will call him to get this week's labs ? Low BP today but asymptomatic- check with his PCP regarding meds especially oxybutynin which can cause postural hypotension ? ___________________________________________________ Discussed with patient,and with step daughter and podiatrist Will see PRN  Note:  This document was prepared using Dragon voice recognition software and may include unintentional dictation errors.

## 2020-12-16 NOTE — Patient Instructions (Addendum)
You are here for follow up of the left foot infection- you also had staph and enterococcus bacteremia in the blood culture- you are on vancomycin for 6 weeks and will complete on 12/28/20 and PICC can be removed- Will get blood culture 10 days after you complete antibiotic

## 2020-12-17 DIAGNOSIS — E1142 Type 2 diabetes mellitus with diabetic polyneuropathy: Secondary | ICD-10-CM | POA: Diagnosis not present

## 2020-12-17 DIAGNOSIS — M19011 Primary osteoarthritis, right shoulder: Secondary | ICD-10-CM | POA: Diagnosis not present

## 2020-12-17 DIAGNOSIS — E1151 Type 2 diabetes mellitus with diabetic peripheral angiopathy without gangrene: Secondary | ICD-10-CM | POA: Diagnosis not present

## 2020-12-17 DIAGNOSIS — I509 Heart failure, unspecified: Secondary | ICD-10-CM | POA: Diagnosis not present

## 2020-12-17 DIAGNOSIS — J449 Chronic obstructive pulmonary disease, unspecified: Secondary | ICD-10-CM | POA: Diagnosis not present

## 2020-12-17 DIAGNOSIS — A4101 Sepsis due to Methicillin susceptible Staphylococcus aureus: Secondary | ICD-10-CM | POA: Diagnosis not present

## 2020-12-17 DIAGNOSIS — I110 Hypertensive heart disease with heart failure: Secondary | ICD-10-CM | POA: Diagnosis not present

## 2020-12-17 DIAGNOSIS — L97421 Non-pressure chronic ulcer of left heel and midfoot limited to breakdown of skin: Secondary | ICD-10-CM | POA: Diagnosis not present

## 2020-12-17 DIAGNOSIS — E11621 Type 2 diabetes mellitus with foot ulcer: Secondary | ICD-10-CM | POA: Diagnosis not present

## 2020-12-18 DIAGNOSIS — E11621 Type 2 diabetes mellitus with foot ulcer: Secondary | ICD-10-CM | POA: Diagnosis not present

## 2020-12-18 DIAGNOSIS — J449 Chronic obstructive pulmonary disease, unspecified: Secondary | ICD-10-CM | POA: Diagnosis not present

## 2020-12-18 DIAGNOSIS — E1142 Type 2 diabetes mellitus with diabetic polyneuropathy: Secondary | ICD-10-CM | POA: Diagnosis not present

## 2020-12-18 DIAGNOSIS — A4101 Sepsis due to Methicillin susceptible Staphylococcus aureus: Secondary | ICD-10-CM | POA: Diagnosis not present

## 2020-12-18 DIAGNOSIS — I509 Heart failure, unspecified: Secondary | ICD-10-CM | POA: Diagnosis not present

## 2020-12-18 DIAGNOSIS — L97421 Non-pressure chronic ulcer of left heel and midfoot limited to breakdown of skin: Secondary | ICD-10-CM | POA: Diagnosis not present

## 2020-12-18 DIAGNOSIS — I110 Hypertensive heart disease with heart failure: Secondary | ICD-10-CM | POA: Diagnosis not present

## 2020-12-18 DIAGNOSIS — E1151 Type 2 diabetes mellitus with diabetic peripheral angiopathy without gangrene: Secondary | ICD-10-CM | POA: Diagnosis not present

## 2020-12-18 DIAGNOSIS — E11628 Type 2 diabetes mellitus with other skin complications: Secondary | ICD-10-CM | POA: Diagnosis not present

## 2020-12-18 DIAGNOSIS — M19011 Primary osteoarthritis, right shoulder: Secondary | ICD-10-CM | POA: Diagnosis not present

## 2020-12-19 ENCOUNTER — Telehealth: Payer: Self-pay | Admitting: Internal Medicine

## 2020-12-19 ENCOUNTER — Ambulatory Visit: Payer: Medicare Other | Admitting: Hospice and Palliative Medicine

## 2020-12-19 DIAGNOSIS — E1142 Type 2 diabetes mellitus with diabetic polyneuropathy: Secondary | ICD-10-CM | POA: Diagnosis not present

## 2020-12-19 DIAGNOSIS — L97421 Non-pressure chronic ulcer of left heel and midfoot limited to breakdown of skin: Secondary | ICD-10-CM | POA: Diagnosis not present

## 2020-12-19 DIAGNOSIS — A4101 Sepsis due to Methicillin susceptible Staphylococcus aureus: Secondary | ICD-10-CM | POA: Diagnosis not present

## 2020-12-19 DIAGNOSIS — J449 Chronic obstructive pulmonary disease, unspecified: Secondary | ICD-10-CM | POA: Diagnosis not present

## 2020-12-19 DIAGNOSIS — E1151 Type 2 diabetes mellitus with diabetic peripheral angiopathy without gangrene: Secondary | ICD-10-CM | POA: Diagnosis not present

## 2020-12-19 DIAGNOSIS — M19011 Primary osteoarthritis, right shoulder: Secondary | ICD-10-CM | POA: Diagnosis not present

## 2020-12-19 DIAGNOSIS — I509 Heart failure, unspecified: Secondary | ICD-10-CM | POA: Diagnosis not present

## 2020-12-19 DIAGNOSIS — E11621 Type 2 diabetes mellitus with foot ulcer: Secondary | ICD-10-CM | POA: Diagnosis not present

## 2020-12-19 DIAGNOSIS — I110 Hypertensive heart disease with heart failure: Secondary | ICD-10-CM | POA: Diagnosis not present

## 2020-12-19 LAB — CULTURE, BLOOD (ROUTINE X 2): Special Requests: ADEQUATE

## 2020-12-19 NOTE — Progress Notes (Signed)
  Chronic Care Management   Note  12/19/2020 Name: [name] MRN: [id number] DOB: 1951/04/14  [name] is a 70 y.o. year old male who is a primary care patient of [name], MD. I reached out to Tillie Rung by phone today in response to a referral sent by [name]'s PCP, [name], MD.   [name] was given information about Chronic Care Management services today including:  1. CCM service includes personalized support from designated clinical staff supervised by his physician, including individualized plan of care and coordination with other care providers 2. 24/7 contact phone numbers for assistance for urgent and routine care needs. 3. Service will only be billed when office clinical staff spend 20 minutes or more in a month to coordinate care. 4. Only one practitioner may furnish and bill the service in a calendar month. 5. The patient may stop CCM services at any time (effective at the end of the month) by phone call to the office staff.   Patient agreed to services and verbal consent obtained.   Follow up plan:   Carley Perdue [employer] Scheduler

## 2020-12-20 DIAGNOSIS — A419 Sepsis, unspecified organism: Secondary | ICD-10-CM | POA: Diagnosis not present

## 2020-12-22 ENCOUNTER — Other Ambulatory Visit: Payer: Self-pay

## 2020-12-22 ENCOUNTER — Other Ambulatory Visit (INDEPENDENT_AMBULATORY_CARE_PROVIDER_SITE_OTHER): Payer: Self-pay | Admitting: Vascular Surgery

## 2020-12-22 DIAGNOSIS — M19011 Primary osteoarthritis, right shoulder: Secondary | ICD-10-CM | POA: Diagnosis not present

## 2020-12-22 DIAGNOSIS — I509 Heart failure, unspecified: Secondary | ICD-10-CM | POA: Diagnosis not present

## 2020-12-22 DIAGNOSIS — A4101 Sepsis due to Methicillin susceptible Staphylococcus aureus: Secondary | ICD-10-CM | POA: Diagnosis not present

## 2020-12-22 DIAGNOSIS — J449 Chronic obstructive pulmonary disease, unspecified: Secondary | ICD-10-CM | POA: Diagnosis not present

## 2020-12-22 DIAGNOSIS — L089 Local infection of the skin and subcutaneous tissue, unspecified: Secondary | ICD-10-CM | POA: Diagnosis not present

## 2020-12-22 DIAGNOSIS — E1151 Type 2 diabetes mellitus with diabetic peripheral angiopathy without gangrene: Secondary | ICD-10-CM | POA: Diagnosis not present

## 2020-12-22 DIAGNOSIS — E1142 Type 2 diabetes mellitus with diabetic polyneuropathy: Secondary | ICD-10-CM | POA: Diagnosis not present

## 2020-12-22 DIAGNOSIS — L97421 Non-pressure chronic ulcer of left heel and midfoot limited to breakdown of skin: Secondary | ICD-10-CM | POA: Diagnosis not present

## 2020-12-22 DIAGNOSIS — T8189XA Other complications of procedures, not elsewhere classified, initial encounter: Secondary | ICD-10-CM | POA: Diagnosis not present

## 2020-12-22 DIAGNOSIS — E11621 Type 2 diabetes mellitus with foot ulcer: Secondary | ICD-10-CM | POA: Diagnosis not present

## 2020-12-22 DIAGNOSIS — I70249 Atherosclerosis of native arteries of left leg with ulceration of unspecified site: Secondary | ICD-10-CM

## 2020-12-22 DIAGNOSIS — I110 Hypertensive heart disease with heart failure: Secondary | ICD-10-CM | POA: Diagnosis not present

## 2020-12-22 NOTE — Patient Instructions (Addendum)
Goals Addressed            This Visit's Progress   . Careful Skin Care-wound vac left heel   On track    Timeframe:  Short-Term Goal Priority:  High Start Date:   11/25/20                          Expected End Date:  02/23/21                     Follow Up Date 01/24/21  - clean and dry skin well  -keep wound vac intact -monitor for signs such as redness, swelling or pain to site   Why is this important?     Taking really good care of your skin will help to keep your skin unbroken.    Notes: 11/25/20 Patient reports wound vac intact with no redness or swelling.  12/03/20 Patient to wear wound vac for 2 more weeks. 12/09/20 Wound making progress per patient 12/22/20 Wound making progress    . Make and Keep All Appointments   On track    Timeframe:  Long-Range Goal Priority:  High Start Date:    11/25/20                         Expected End Date:    05/27/21                   Follow Up Date 01/24/21   - call to cancel if needed    Why is this important?    Part of staying healthy is seeing the doctor for follow-up care.   If you forget your appointments, there are some things you can do to stay on track.    Notes: Patient daughter to make follow up appointments today 11/25/20.  12/03/20 patient seeing physicians as scheduled.  12/09/20 Patient has no problems with transportation.     . Monitor and Manage My Blood Sugar-Diabetes Type 2   On track    Timeframe:  Long-Range Goal Priority:  High Start Date:    11/25/20                         Expected End Date:   05/27/21                    Follow Up Date 01/24/21 - check blood sugar at prescribed times - take the blood sugar log to all doctor visits    Why is this important?    Checking your blood sugar at home helps to keep it from getting very high or very low.   Writing the results in a diary or log helps the doctor know how to care for you.   Your blood sugar log should have the time, date and the results.   Also, write down the  amount of insulin or other medicine that you take.   Other information, like what you ate, exercise done and how you were feeling, will also be helpful.     Notes: Patient currently not checking encouraged to check. 12/03/20 Patient not checking sugars currently. Encouraged patient to check sugars. 12/09/20 Patient checked blood sugar it was 223.   12/22/20  PLEASE CHECK YOUR BLOOD SUGARS.  Wound Infection A wound infection happens when tiny organisms (microorganisms) start to grow in a wound. A wound infection is most often caused by bacteria. Infection can cause the wound to break open or worsen. Wound infection needs treatment. If a wound infection is left untreated, complications can occur. Untreated wound infections may lead to an infection in the bloodstream (septicemia) or a bone infection (osteomyelitis). What are the causes? This condition is most often caused by bacteria growing in a wound. Other microorganisms, like yeast and fungi, can also cause wound infections. What increases the risk? The following factors may make you more likely to develop this condition:  Having a weak body defense system (immune system).  Having diabetes.  Taking steroid medicines for a long time (chronic use).  Smoking.  Being an older person.  Being overweight.  Taking chemotherapy medicines. What are the signs or symptoms? Symptoms of this condition include:  Having more redness, swelling, or pain at the wound site.  Having more blood or fluid at the wound site.  A bad smell coming from a wound or bandage (dressing).  Having a fever.  Feeling tired or fatigued.  Having warmth at or around the wound.  Having pus at the wound site. How is this diagnosed? This condition is diagnosed with a medical history and physical exam. You may also have a  wound culture or blood tests or both. How is this treated? This condition is usually treated with an antibiotic medicine.  The infection should improve 24-48 hours after you start antibiotics.  After 24-48 hours, redness around the wound should stop spreading, and the wound should be less painful. Follow these instructions at home: Medicines  Take or apply over-the-counter and prescription medicines only as told by your health care provider.  If you were prescribed an antibiotic medicine, take or apply it as told by your health care provider. Do not stop using the antibiotic even if you start to feel better. Wound care  Clean the wound each day, or as told by your health care provider. ? Wash the wound with mild soap and water. ? Rinse the wound with water to remove all soap. ? Pat the wound dry with a clean towel. Do not rub it.  Follow instructions from your health care provider about how to take care of your wound. Make sure you: ? Wash your hands with soap and water before and after you change your dressing. If soap and water are not available, use hand sanitizer. ? Change your dressing as told by your health care provider. ? Leave stitches (sutures), skin glue, or adhesive strips in place if your wound has been closed. These skin closures may need to stay in place for 2 weeks or longer. If adhesive strip edges start to loosen and curl up, you may trim the loose edges. Do not remove adhesive strips completely unless your health care provider tells you to do that. Some wounds are left open to heal on their own.  Check your wound every day for signs of infection. Watch for: ? More redness, swelling, or pain. ? More fluid or blood. ? Warmth. ? Pus or a bad smell.   General instructions  Keep the dressing dry until your health care provider says it can be removed.  Do not take baths, swim, or use a hot tub until your health care provider approves. Ask your health care provider if  you may take showers. You may only be allowed to take sponge baths.  Raise (elevate) the injured area above the level of your heart while you are sitting or lying down.  Do not scratch or pick at the wound.  Keep all follow-up visits as told by your health care provider. This is important. Contact a health care provider if:  Your pain is not controlled with medicine.  You have more redness, swelling, or pain around your wound.  You have more fluid or blood coming from your wound.  Your wound feels warm to the touch.  You have pus coming from your wound.  You continue to notice a bad smell coming from your wound or your dressing.  Your wound that was closed breaks open. Get help right away if:  You have a red streak going away from your wound.  You have a fever. Summary  A wound infection happens when tiny organisms (microorganisms) start to grow in a wound.  This condition is usually treated with an antibiotic medicine.  Follow instructions from your health care provider about how to take care of your wound.  Contact a health care provider if your wound infection does not begin to improve in 24-48 hours, or your symptoms worsen.  Keep all follow-up visits as told by your health care provider. This is important. This information is not intended to replace advice given to you by your health care provider. Make sure you discuss any questions you have with your health care provider. Document Revised: 04/25/2018 Document Reviewed: 04/25/2018 Elsevier Patient Education  [place].

## 2020-12-22 NOTE — Patient Outreach (Signed)
[facility]) Care Management  [place]  12/22/2020   [name] 06-16-1951 [id number]  Subjective: Telephone call to patient for follow up. Patient report wound is better per home health nurse.  Patient reports wound vac care continues on MWF by home health. Discussed signs of infection and importance of checking sugars.  He verbalizes understanding and states he will check sugars.     Objective:   Encounter Medications:  Outpatient Encounter Medications as of 12/22/2020  Medication Sig  . aspirin EC 81 MG tablet Take 81 mg by mouth daily.  . Calcium Carbonate-Vitamin D (CALCIUM PLUS VITAMIN D PO) Take 1 tablet by mouth daily.  . clopidogrel (PLAVIX) 75 MG tablet Take 1 tablet (75 mg total) by mouth daily.  Marland Kitchen gabapentin (NEURONTIN) 300 MG capsule Take 1 capsule (300 mg total) by mouth 3 (three) times daily.  Marland Kitchen glucose blood (ACCU-CHEK GUIDE) test strip Use as instructed  . glyBURIDE (DIABETA) 5 MG tablet Take 1 tablet (5 mg total) by mouth 2 (two) times daily with a meal.  . Lancets (ACCU-CHEK SOFT TOUCH) lancets Use as instructed  . lovastatin (MEVACOR) 10 MG tablet Take 1 tab  Po QHS  . meloxicam (MOBIC) 15 MG tablet Take by mouth.  . mometasone-formoterol (DULERA) 100-5 MCG/ACT AERO Inhale 2 puffs into the lungs 2 (two) times daily as needed for wheezing.  . ondansetron (ZOFRAN-ODT) 4 MG disintegrating tablet Take 1 tablet (4 mg total) by mouth every 8 (eight) hours as needed for nausea or vomiting.  Marland Kitchen oxybutynin (DITROPAN) 5 MG tablet Take 1 tablet (5 mg total) by mouth 2 (two) times daily.  . traMADol (ULTRAM) 50 MG tablet TAKE 1 TABLET BY MOUTH EVERY 12 HOURS ASNEEDED FOR PAIN  . vancomycin IVPB Inject 1,000 mg into the vein every 12 (twelve) hours. Indication:  Enterococcus and MSSA bacteremia, Left foot infection First Dose: Yes Last Day of Therapy:  12/28/20 Labs - Monday:  CBC/D, CMP, and vancomycin trough. Labs - Thursday:  BMP and vancomycin  trough Remove PIC line with completion of antibiotics Method of administration:Elastomeric Method of administration may be changed at the discretion of the patient and/or caregiver's ability to self-administer the medication ordered.   No facility-administered encounter medications on file as of 12/22/2020.    Functional Status:  In your present state of health, do you have any difficulty performing the following activities: 11/25/2020 11/12/2020  Hearing? N N  Vision? N N  Difficulty concentrating or making decisions? N N  Walking or climbing stairs? Y Y  Comment weakness and foot wound -  Dressing or bathing? N N  Doing errands, shopping? N N  Preparing Food and eating ? N -  Using the Toilet? N -  In the past six months, have you accidently leaked urine? N -  Do you have problems with loss of bowel control? N -  Managing your Medications? N -  Managing your Finances? N -  Housekeeping or managing your Housekeeping? Y -  Comment daughter [name] helps -  Some recent data might be hidden    Fall/Depression Screening: Fall Risk  11/25/2020 09/22/2020 06/20/2020  Falls in the past year? 0 0 0  Number falls in past yr: - - -  Injury with Fall? - - -   PHQ 2/9 Scores 11/25/2020 09/22/2020 06/20/2020 03/20/2020 12/17/2019 09/17/2019 06/15/2019  PHQ - 2 Score 0 0 0 0 0 0 0    Assessment:  Goals Addressed  This Visit's Progress   . Careful Skin Care-wound vac left heel   On track    Timeframe:  Short-Term Goal Priority:  High Start Date:   11/25/20                          Expected End Date:  02/23/21                     Follow Up Date 01/24/21  - clean and dry skin well  -keep wound vac intact -monitor for signs such as redness, swelling or pain to site   Why is this important?     Taking really good care of your skin will help to keep your skin unbroken.    Notes: 11/25/20 Patient reports wound vac intact with no redness or swelling.  12/03/20 Patient to wear wound vac  for 2 more weeks. 12/09/20 Wound making progress per patient 12/22/20 Wound making progress    . Make and Keep All Appointments   On track    Timeframe:  Long-Range Goal Priority:  High Start Date:    11/25/20                         Expected End Date:    05/27/21                   Follow Up Date 01/24/21   - call to cancel if needed    Why is this important?    Part of staying healthy is seeing the doctor for follow-up care.   If you forget your appointments, there are some things you can do to stay on track.    Notes: Patient daughter to make follow up appointments today 11/25/20.  12/03/20 patient seeing physicians as scheduled.  12/09/20 Patient has no problems with transportation.     . Monitor and Manage My Blood Sugar-Diabetes Type 2   On track    Timeframe:  Long-Range Goal Priority:  High Start Date:    11/25/20                         Expected End Date:   05/27/21                    Follow Up Date 01/24/21 - check blood sugar at prescribed times - take the blood sugar log to all doctor visits    Why is this important?    Checking your blood sugar at home helps to keep it from getting very high or very low.   Writing the results in a diary or log helps the doctor know how to care for you.   Your blood sugar log should have the time, date and the results.   Also, write down the amount of insulin or other medicine that you take.   Other information, like what you ate, exercise done and how you were feeling, will also be helpful.     Notes: Patient currently not checking encouraged to check. 12/03/20 Patient not checking sugars currently. Encouraged patient to check sugars. 12/09/20 Patient checked blood sugar it was 223.   12/22/20  PLEASE CHECK YOUR BLOOD SUGARS.       Plan: RN CM will follow up next month.   Follow-up:  Patient agrees to Care Plan and Follow-up.   [name], RN, MSN [facility] Care Management Care Management  Coordinator Direct Line [phone] Cell  [phone] Toll Free: [phone]  Fax: [phone]

## 2020-12-23 ENCOUNTER — Other Ambulatory Visit: Payer: Self-pay

## 2020-12-23 ENCOUNTER — Ambulatory Visit (INDEPENDENT_AMBULATORY_CARE_PROVIDER_SITE_OTHER): Payer: Medicare HMO

## 2020-12-23 ENCOUNTER — Ambulatory Visit (INDEPENDENT_AMBULATORY_CARE_PROVIDER_SITE_OTHER): Payer: Medicare Other | Admitting: Vascular Surgery

## 2020-12-23 DIAGNOSIS — I70249 Atherosclerosis of native arteries of left leg with ulceration of unspecified site: Secondary | ICD-10-CM | POA: Diagnosis not present

## 2020-12-24 DIAGNOSIS — M19011 Primary osteoarthritis, right shoulder: Secondary | ICD-10-CM | POA: Diagnosis not present

## 2020-12-24 DIAGNOSIS — I110 Hypertensive heart disease with heart failure: Secondary | ICD-10-CM | POA: Diagnosis not present

## 2020-12-24 DIAGNOSIS — A4101 Sepsis due to Methicillin susceptible Staphylococcus aureus: Secondary | ICD-10-CM | POA: Diagnosis not present

## 2020-12-24 DIAGNOSIS — E11621 Type 2 diabetes mellitus with foot ulcer: Secondary | ICD-10-CM | POA: Diagnosis not present

## 2020-12-24 DIAGNOSIS — E1151 Type 2 diabetes mellitus with diabetic peripheral angiopathy without gangrene: Secondary | ICD-10-CM | POA: Diagnosis not present

## 2020-12-24 DIAGNOSIS — J449 Chronic obstructive pulmonary disease, unspecified: Secondary | ICD-10-CM | POA: Diagnosis not present

## 2020-12-24 DIAGNOSIS — I509 Heart failure, unspecified: Secondary | ICD-10-CM | POA: Diagnosis not present

## 2020-12-24 DIAGNOSIS — E1142 Type 2 diabetes mellitus with diabetic polyneuropathy: Secondary | ICD-10-CM | POA: Diagnosis not present

## 2020-12-24 DIAGNOSIS — L97421 Non-pressure chronic ulcer of left heel and midfoot limited to breakdown of skin: Secondary | ICD-10-CM | POA: Diagnosis not present

## 2020-12-25 ENCOUNTER — Telehealth: Payer: Self-pay | Admitting: Infectious Diseases

## 2020-12-25 ENCOUNTER — Telehealth: Payer: Self-pay

## 2020-12-25 DIAGNOSIS — E11621 Type 2 diabetes mellitus with foot ulcer: Secondary | ICD-10-CM | POA: Diagnosis not present

## 2020-12-25 DIAGNOSIS — L97421 Non-pressure chronic ulcer of left heel and midfoot limited to breakdown of skin: Secondary | ICD-10-CM | POA: Diagnosis not present

## 2020-12-25 DIAGNOSIS — M19011 Primary osteoarthritis, right shoulder: Secondary | ICD-10-CM | POA: Diagnosis not present

## 2020-12-25 DIAGNOSIS — E1142 Type 2 diabetes mellitus with diabetic polyneuropathy: Secondary | ICD-10-CM | POA: Diagnosis not present

## 2020-12-25 DIAGNOSIS — I110 Hypertensive heart disease with heart failure: Secondary | ICD-10-CM | POA: Diagnosis not present

## 2020-12-25 DIAGNOSIS — E1151 Type 2 diabetes mellitus with diabetic peripheral angiopathy without gangrene: Secondary | ICD-10-CM | POA: Diagnosis not present

## 2020-12-25 DIAGNOSIS — J449 Chronic obstructive pulmonary disease, unspecified: Secondary | ICD-10-CM | POA: Diagnosis not present

## 2020-12-25 DIAGNOSIS — I509 Heart failure, unspecified: Secondary | ICD-10-CM | POA: Diagnosis not present

## 2020-12-25 DIAGNOSIS — A4101 Sepsis due to Methicillin susceptible Staphylococcus aureus: Secondary | ICD-10-CM | POA: Diagnosis not present

## 2020-12-25 NOTE — Telephone Encounter (Addendum)
I called [name] with [facility] and verbal orders were given for picc to be removed after last dose of IV antibiotics on 12/28/20. [name] verbalized understanding. [name]

## 2020-12-25 NOTE — Telephone Encounter (Signed)
Called patient and spoke to him first and then spoke to his daughter [name] Pts labs from the last 2 times has low platelt aorund 110. WBC is 3.9 ( baseline 4.9) Hb stable at 10.5 He is to finish 6 weeks of IV vanco on 4/3- Will complete tomorrow and Remove PICC. Will ask his nurse Linward Foster [phone]) to get cbc on Monday and April 15th Blood culture

## 2020-12-26 DIAGNOSIS — J449 Chronic obstructive pulmonary disease, unspecified: Secondary | ICD-10-CM | POA: Diagnosis not present

## 2020-12-26 DIAGNOSIS — L97421 Non-pressure chronic ulcer of left heel and midfoot limited to breakdown of skin: Secondary | ICD-10-CM | POA: Diagnosis not present

## 2020-12-26 DIAGNOSIS — I110 Hypertensive heart disease with heart failure: Secondary | ICD-10-CM | POA: Diagnosis not present

## 2020-12-26 DIAGNOSIS — M19011 Primary osteoarthritis, right shoulder: Secondary | ICD-10-CM | POA: Diagnosis not present

## 2020-12-26 DIAGNOSIS — E11621 Type 2 diabetes mellitus with foot ulcer: Secondary | ICD-10-CM | POA: Diagnosis not present

## 2020-12-26 DIAGNOSIS — E1151 Type 2 diabetes mellitus with diabetic peripheral angiopathy without gangrene: Secondary | ICD-10-CM | POA: Diagnosis not present

## 2020-12-26 DIAGNOSIS — I509 Heart failure, unspecified: Secondary | ICD-10-CM | POA: Diagnosis not present

## 2020-12-26 DIAGNOSIS — E1142 Type 2 diabetes mellitus with diabetic polyneuropathy: Secondary | ICD-10-CM | POA: Diagnosis not present

## 2020-12-26 DIAGNOSIS — A4101 Sepsis due to Methicillin susceptible Staphylococcus aureus: Secondary | ICD-10-CM | POA: Diagnosis not present

## 2020-12-27 DIAGNOSIS — A419 Sepsis, unspecified organism: Secondary | ICD-10-CM | POA: Diagnosis not present

## 2020-12-29 ENCOUNTER — Telehealth: Payer: Self-pay

## 2020-12-29 DIAGNOSIS — I110 Hypertensive heart disease with heart failure: Secondary | ICD-10-CM | POA: Diagnosis not present

## 2020-12-29 DIAGNOSIS — M19011 Primary osteoarthritis, right shoulder: Secondary | ICD-10-CM | POA: Diagnosis not present

## 2020-12-29 DIAGNOSIS — E1151 Type 2 diabetes mellitus with diabetic peripheral angiopathy without gangrene: Secondary | ICD-10-CM | POA: Diagnosis not present

## 2020-12-29 DIAGNOSIS — E1142 Type 2 diabetes mellitus with diabetic polyneuropathy: Secondary | ICD-10-CM | POA: Diagnosis not present

## 2020-12-29 DIAGNOSIS — I509 Heart failure, unspecified: Secondary | ICD-10-CM | POA: Diagnosis not present

## 2020-12-29 DIAGNOSIS — L97421 Non-pressure chronic ulcer of left heel and midfoot limited to breakdown of skin: Secondary | ICD-10-CM | POA: Diagnosis not present

## 2020-12-29 DIAGNOSIS — E11621 Type 2 diabetes mellitus with foot ulcer: Secondary | ICD-10-CM | POA: Diagnosis not present

## 2020-12-29 DIAGNOSIS — A4101 Sepsis due to Methicillin susceptible Staphylococcus aureus: Secondary | ICD-10-CM | POA: Diagnosis not present

## 2020-12-29 DIAGNOSIS — J449 Chronic obstructive pulmonary disease, unspecified: Secondary | ICD-10-CM | POA: Diagnosis not present

## 2020-12-29 NOTE — Telephone Encounter (Signed)
Message on voice mail asking if patients PICC should be removed.   Return call to [facility], RN - left message on voice mail stating order in medical record dated 12-25-2020  indicates the PICC can be removed on 4-3- 2022. Request for labs on 12-29-2020 and blood cultures on April 15 th, 2022. Advised to call [facility] on provider line if there are any questions.   Laverle Patter, [place] , [phone]

## 2020-12-30 ENCOUNTER — Encounter: Payer: Self-pay | Admitting: Hospice and Palliative Medicine

## 2020-12-30 ENCOUNTER — Other Ambulatory Visit: Payer: Self-pay

## 2020-12-30 ENCOUNTER — Ambulatory Visit (INDEPENDENT_AMBULATORY_CARE_PROVIDER_SITE_OTHER): Payer: Medicare HMO | Admitting: Hospice and Palliative Medicine

## 2020-12-30 ENCOUNTER — Encounter (INDEPENDENT_AMBULATORY_CARE_PROVIDER_SITE_OTHER): Payer: Self-pay | Admitting: *Deleted

## 2020-12-30 VITALS — BP 112/68 | HR 80 | Temp 98.3°F | Resp 16 | Ht 70.0 in | Wt 152.2 lb

## 2020-12-30 DIAGNOSIS — J449 Chronic obstructive pulmonary disease, unspecified: Secondary | ICD-10-CM | POA: Diagnosis not present

## 2020-12-30 DIAGNOSIS — R1310 Dysphagia, unspecified: Secondary | ICD-10-CM

## 2020-12-30 DIAGNOSIS — M542 Cervicalgia: Secondary | ICD-10-CM | POA: Diagnosis not present

## 2020-12-30 DIAGNOSIS — B370 Candidal stomatitis: Secondary | ICD-10-CM | POA: Diagnosis not present

## 2020-12-30 DIAGNOSIS — G8929 Other chronic pain: Secondary | ICD-10-CM

## 2020-12-30 DIAGNOSIS — R0902 Hypoxemia: Secondary | ICD-10-CM

## 2020-12-30 MED ORDER — NYSTATIN 100000 UNIT/ML MT SUSP: 5.0000 mL | Freq: Four times a day (QID) | OROMUCOSAL | 0 refills | Status: DC

## 2020-12-30 MED ORDER — CYCLOBENZAPRINE HCL 10 MG PO TABS: 10.0000 mg | ORAL_TABLET | Freq: Three times a day (TID) | ORAL | 0 refills | Status: DC | PRN

## 2020-12-30 NOTE — Progress Notes (Signed)
[facility], [place] [address]  Internal MEDICINE  Office Visit Note  Patient Name: [name]  [id number]  [id number]  Date of Service: 12/31/2020  Chief Complaint  Patient presents with  . COPD    4 week fup  . Sore Throat    Started Sunday, hurts when he swallows    HPI Patient is here for routine follow-up Continues to be followed by ID for recent bacteremia, had PICC line removed earlier this week as he has completed IV antibiotics Concerned about wearing continuous oxygen, sent to emergency department for hypoxia but SpO2 levels were found to be 98% using an ear probe, has poor circulation to his hands During his hospital admission he was not put on oxygen and levels maintained upper 90's--does notice a drop to around 88-89% when he is up moving around but at this time he primarily sits as he is non-weight bearing to his left lower extremity  Daughter concerned about his swallowing, over the last several weeks she has noticed him choking fairly frequently with meals, has also noticed choking episodes with him only drinking liquids  C/o sore throat and tongue, concerned about thrush due to recently completing many antibiotics--has had thrush in the past and symptoms are similar  C/o neck pain--has had chronic neck pain for several years, feels pain is worsened with having to be stationary due to non-weight bearing, finds himself slouched over more and notices pain in neck when he attempts to sit up straight  Current Medication: Outpatient Encounter Medications as of 12/30/2020  Medication Sig  . aspirin EC 81 MG tablet Take 81 mg by mouth daily.  . Calcium Carbonate-Vitamin D (CALCIUM PLUS VITAMIN D PO) Take 1 tablet by mouth daily.  . clopidogrel (PLAVIX) 75 MG tablet Take 1 tablet (75 mg total) by mouth daily.  . cyclobenzaprine (FLEXERIL) 10 MG tablet Take 1 tablet (10 mg total) by mouth 3 (three) times daily as needed for muscle spasms.   Marland Kitchen gabapentin (NEURONTIN) 300 MG capsule Take 1 capsule (300 mg total) by mouth 3 (three) times daily.  Marland Kitchen glucose blood (ACCU-CHEK GUIDE) test strip Use as instructed  . glyBURIDE (DIABETA) 5 MG tablet Take 1 tablet (5 mg total) by mouth 2 (two) times daily with a meal.  . Lancets (ACCU-CHEK SOFT TOUCH) lancets Use as instructed  . lovastatin (MEVACOR) 10 MG tablet Take 1 tab  Po QHS  . meloxicam (MOBIC) 15 MG tablet Take by mouth.  . mometasone-formoterol (DULERA) 100-5 MCG/ACT AERO Inhale 2 puffs into the lungs 2 (two) times daily as needed for wheezing.  . nystatin (MYCOSTATIN) 100000 UNIT/ML suspension Take 5 mLs (500,000 Units total) by mouth 4 (four) times daily.  . ondansetron (ZOFRAN-ODT) 4 MG disintegrating tablet Take 1 tablet (4 mg total) by mouth every 8 (eight) hours as needed for nausea or vomiting.  Marland Kitchen oxybutynin (DITROPAN) 5 MG tablet Take 1 tablet (5 mg total) by mouth 2 (two) times daily.  . traMADol (ULTRAM) 50 MG tablet TAKE 1 TABLET BY MOUTH EVERY 12 HOURS ASNEEDED FOR PAIN  . [DISCONTINUED] vancomycin IVPB Inject 1,000 mg into the vein every 12 (twelve) hours. Indication:  Enterococcus and MSSA bacteremia, Left foot infection First Dose: Yes Last Day of Therapy:  12/28/20 Labs - Monday:  CBC/D, CMP, and vancomycin trough. Labs - Thursday:  BMP and vancomycin trough Remove PIC line with completion of antibiotics Method of administration:Elastomeric Method of administration may be changed at the discretion  of the patient and/or caregiver's ability to self-administer the medication ordered. (Patient not taking: Reported on 12/30/2020)   No facility-administered encounter medications on file as of 12/30/2020.    Surgical History: Past Surgical History:  Procedure Laterality Date  . AMPUTATION TOE Left 02/25/2017   Procedure: AMPUTATION TOE-LEFT 2ND MPJ;  Surgeon: Samara Deist, DPM;  Location: [facility] ORS;  Service: Podiatry;  Laterality: Left;  . AMPUTATION TOE Left 10/20/2018    Procedure: TOE MPJ T2 LEFT;  Surgeon: Samara Deist, DPM;  Location: [facility] ORS;  Service: Podiatry;  Laterality: Left;  . AMPUTATION TOE Left 01/12/2019   Procedure: AMPUTATION LEFT 5TH TOE AND JOINT;  Surgeon: Samara Deist, DPM;  Location: [facility] ORS;  Service: Podiatry;  Laterality: Left;  . BACK SURGERY  2008  . BONE BIOPSY Left 11/21/2020   Procedure: BONE BIOPSY;  Surgeon: Samara Deist, DPM;  Location: [facility] ORS;  Service: Podiatry;  Laterality: Left;  . CHOLECYSTECTOMY    . COLONOSCOPY WITH PROPOFOL N/A 08/15/2017   Procedure: COLONOSCOPY WITH PROPOFOL;  Surgeon: Lollie Sails, MD;  Location: [facility] ENDOSCOPY;  Service: Endoscopy;  Laterality: N/A;  . ENDARTERECTOMY Right 05/12/2017   Procedure: ENDARTERECTOMY CAROTID;  Surgeon: [name], MD;  Location: [facility] ORS;  Service: Vascular;  Laterality: Right;  . ENDARTERECTOMY FEMORAL Left 12/01/2018   Procedure: ENDARTERECTOMY FEMORAL;  Surgeon: [name], MD;  Location: [facility] ORS;  Service: Vascular;  Laterality: Left;  . ERCP    . FOOT SURGERY Right    x 3  . IRRIGATION AND DEBRIDEMENT FOOT Left 11/21/2020   Procedure: IRRIGATION AND DEBRIDEMENT FOOT--with placement of wound vac;  Surgeon: Samara Deist, DPM;  Location: [facility] ORS;  Service: Podiatry;  Laterality: Left;  . LOWER EXTREMITY ANGIOGRAM Left 12/01/2018   Procedure: LOWER EXTREMITY ANGIOGRAM;  Surgeon: [name], MD;  Location: [facility] ORS;  Service: Vascular;  Laterality: Left;  . LOWER EXTREMITY ANGIOGRAPHY Left 10/30/2018   Procedure: LOWER EXTREMITY ANGIOGRAPHY;  Surgeon: [name], MD;  Location: [place] CV LAB;  Service: Cardiovascular;  Laterality: Left;  . LOWER EXTREMITY ANGIOGRAPHY Left 11/30/2018   Procedure: LOWER EXTREMITY ANGIOGRAPHY;  Surgeon: [name], MD;  Location: [place] CV LAB;  Service: Cardiovascular;  Laterality: Left;  . LOWER EXTREMITY ANGIOGRAPHY Left 11/17/2020   Procedure: Lower Extremity Angiography;  Surgeon: [name], MD;   Location: [place] CV LAB;  Service: Cardiovascular;  Laterality: Left;  . TEE WITHOUT CARDIOVERSION N/A 11/14/2020   Procedure: TRANSESOPHAGEAL ECHOCARDIOGRAM (TEE);  Surgeon: Corey Skains, MD;  Location: [facility] ORS;  Service: Cardiovascular;  Laterality: N/A;    Medical History: Past Medical History:  Diagnosis Date  . Cholecystitis, chronic   . Cholelithiasis   . COPD (chronic obstructive pulmonary disease) ([place])   . Diabetes mellitus without complication ([place])   . Diabetic retinopathy ([place])   . Dyspnea   . Fatty liver 2008  . Fracture of clavicle 1992   left   . Fracture of ribs, multiple 1992   3 ribs  . GERD (gastroesophageal reflux disease)   . Hyperlipidemia   . Hypertension   . Paroxysmal SVT (supraventricular tachycardia) ([place])   . Pelvis fracture ([place]) 1992  . Peripheral vascular disease ([place])   . Pleurisy   . Pleurisy   . Seasonal allergies     Family History: Family History  Problem Relation Age of Onset  . Diabetes Sister     Social History   Socioeconomic History  . Marital status:  Single    Spouse name: Not on file  . Number of children: Not on file  . Years of education: Not on file  . Highest education level: Not on file  Occupational History  . Not on file  Tobacco Use  . Smoking status: Former Smoker    Quit date: 02/24/1997    Years since quitting: 23.8  . Smokeless tobacco: Never Used  Vaping Use  . Vaping Use: Never used  Substance and Sexual Activity  . Alcohol use: No  . Drug use: No  . Sexual activity: Not on file  Other Topics Concern  . Not on file  Social History Narrative  . Not on file   Social Determinants of Health   Financial Resource Strain: Not on file  Food Insecurity: No Food Insecurity  . Worried About Charity fundraiser in the Last Year: Never true  . Ran Out of Food in the Last Year: Never true  Transportation Needs: No Transportation Needs  . Lack of Transportation (Medical): No  . Lack of  Transportation (Non-Medical): No  Physical Activity: Not on file  Stress: Not on file  Social Connections: Not on file  Intimate Partner Violence: Not on file      Review of Systems  Constitutional: Negative for chills, fatigue and unexpected weight change.  HENT: Positive for mouth sores and sore throat. Negative for congestion, postnasal drip, rhinorrhea and sneezing.   Eyes: Negative for redness.  Respiratory: Negative for cough, chest tightness and shortness of breath.        Difficulty swallowing with episodes of choking on food and liquids  Cardiovascular: Negative for chest pain and palpitations.  Gastrointestinal: Negative for abdominal pain, constipation, diarrhea, nausea and vomiting.  Genitourinary: Negative for dysuria and frequency.  Musculoskeletal: Positive for neck pain and neck stiffness. Negative for arthralgias, back pain and joint swelling.  Skin: Negative for rash.  Neurological: Negative for tremors and numbness.  Hematological: Negative for adenopathy. Does not bruise/bleed easily.  Psychiatric/Behavioral: Negative for behavioral problems (Depression), sleep disturbance and suicidal ideas. The patient is not nervous/anxious.     Vital Signs: BP 112/68   Pulse 80   Temp 98.3 F (36.8 C)   Resp 16   Ht 5\' 10"  (1.778 m)   Wt 152 lb 3.2 oz (69 kg)   SpO2 95% Comment: 2 liters of oxygen  BMI 21.84 kg/m    Physical Exam Vitals reviewed.  Constitutional:      Appearance: Normal appearance. He is normal weight.  HENT:     Mouth/Throat:     Comments: Tongue with erythematous coating Cardiovascular:     Rate and Rhythm: Normal rate and regular rhythm.     Pulses: Normal pulses.     Heart sounds: Normal heart sounds.  Pulmonary:     Effort: Pulmonary effort is normal.     Breath sounds: Normal breath sounds.  Abdominal:     General: Abdomen is flat.     Palpations: Abdomen is soft.  Musculoskeletal:        General: Normal range of motion.      Cervical back: Normal range of motion.  Skin:    General: Skin is warm.  Neurological:     General: No focal deficit present.     Mental Status: He is alert and oriented to person, place, and time. Mental status is at baseline.  Psychiatric:        Mood and Affect: Mood normal.  Behavior: Behavior normal.        Thought Content: Thought content normal.        Judgment: Judgment normal.    Assessment/Plan: 1. Dysphagia, unspecified type Will review UGI and treat accordingly Discussed safe swallowing techniques, small bites and thorough chewing, increase fluid intake while eating - DG UGI W DOUBLE CM (HD BA); Future  2. COPD with hypoxia ([place]) Encouraged to monitor SpO2 levels, goal SpO2 >90%, may take breaks from oxygen while stationary as SpO2 levels allow Continue with all supportive therapies  3. Oral thrush Start Nystatin mouth rinse to treat thrush likely from recent antibiotic use due to septicemia  - nystatin (MYCOSTATIN) 100000 UNIT/ML suspension; Take 5 mLs (500,000 Units total) by mouth 4 (four) times daily.  Dispense: 60 mL; Refill: 0  4. Chronic neck pain May use Flexeril as needed for neck pain, encouraged stretching neck with ROM exercises to prevent stiffness - cyclobenzaprine (FLEXERIL) 10 MG tablet; Take 1 tablet (10 mg total) by mouth 3 (three) times daily as needed for muscle spasms.  Dispense: 30 tablet; Refill: 0  General Counseling: Doye verbalizes understanding of the findings of todays visit and agrees with plan of treatment. I have discussed any further diagnostic evaluation that may be needed or ordered today. We also reviewed his medications today. he has been encouraged to call the office with any questions or concerns that should arise related to todays visit.    Orders Placed This Encounter  Procedures  . DG UGI W DOUBLE CM (HD BA)    Meds ordered this encounter  Medications  . nystatin (MYCOSTATIN) 100000 UNIT/ML suspension    Sig: Take  5 mLs (500,000 Units total) by mouth 4 (four) times daily.    Dispense:  60 mL    Refill:  0  . cyclobenzaprine (FLEXERIL) 10 MG tablet    Sig: Take 1 tablet (10 mg total) by mouth 3 (three) times daily as needed for muscle spasms.    Dispense:  30 tablet    Refill:  0    Time spent: 30 Minutes Time spent includes review of chart, medications, test results and follow-up plan with the patient.  This patient was seen by Theodoro Grist AGNP-C in Collaboration with Dr [name] as a part of collaborative care agreement     Tanna Furry. Miller [place] AGNP-C Internal medicine

## 2020-12-30 NOTE — Progress Notes (Incomplete)
Chronic Care Management Pharmacy Note  12/30/2020 Name:  [name] MRN:  [id number] DOB:  22-Oct-1950  Subjective: [name] is an 70 y.o. year old male who is a primary patient of [name], [name], MD.  The CCM team was consulted for assistance with disease management and care coordination needs.    Engaged with patient by telephone for initial visit in response to provider referral for pharmacy case management and/or care coordination services.   Consent to Services:  The patient was given the following information about Chronic Care Management services today, agreed to services, and gave verbal consent: 1. CCM service includes personalized support from designated clinical staff supervised by the primary care provider, including individualized plan of care and coordination with other care providers 2. 24/7 contact phone numbers for assistance for urgent and routine care needs. 3. Service will only be billed when office clinical staff spend 20 minutes or more in a month to coordinate care. 4. Only one practitioner may furnish and bill the service in a calendar month. 5.The patient may stop CCM services at any time (effective at the end of the month) by phone call to the office staff. 6. The patient will be responsible for cost sharing (co-pay) of up to 20% of the service fee (after annual deductible is met). Patient agreed to services and consent obtained.  Patient Care Team: [name], MD as PCP - General (Internal Medicine) [name], RN as [place] [place] as Pharmacist (Pharmacist)  Recent office visits: 12/02/20 [name]) - no changes to medication, may need duonebs at home  11/11/20 [name]) - acute sick visit, O2 saturation was 81%.  All antihypertensives were help due to hypotension  11/03/20 [name]) - abdominal pain and diarrhea.  Given Zofran for the nausea.  Xray ordered - no evidence of obstruction.  09/22/20 ([place]) - may  no take tramadol up to bid for pain.  New rx called in for this dose.  Recent consult visits: 12/16/20 (Ravishankar. Infectious disease) - patient following up on L foot infection.  He is on vancomycin x 6 weeks until 12/28/20.  11/26/20 Vickki Muff) - L heel ulcer follow up.  Possibility of graft if insurance covers it. Hospital visits: None in previous 6 months  Objective:  Lab Results  Component Value Date   CREATININE 0.92 11/23/2020   BUN 13 11/19/2020   GFRNONAA >60 11/23/2020   GFRAA >60 06/20/2020   NA 133 (L) 11/19/2020   K 4.5 11/19/2020   CALCIUM 8.7 (L) 11/19/2020   CO2 27 11/19/2020   GLUCOSE 213 (H) 11/19/2020    Lab Results  Component Value Date/Time   HGBA1C 8.9 (H) 11/13/2020 06:36 AM   HGBA1C 7.4 (A) 09/22/2020 08:44 AM   HGBA1C 7.4 (A) 06/20/2020 08:51 AM   HGBA1C 6.4 (H) 07/10/2015 02:44 PM   MICROALBUR <3.0 (H) 01/04/2017 08:03 AM    Last diabetic Eye exam: No results found for: HMDIABEYEEXA  Last diabetic Foot exam: No results found for: HMDIABFOOTEX   Lab Results  Component Value Date   CHOL 110 06/20/2020   HDL 28 (L) 06/20/2020   LDLCALC 50 06/20/2020   TRIG 159 (H) 06/20/2020   CHOLHDL 3.9 06/20/2020    Hepatic Function Latest Ref Rng & Units 11/19/2020 11/18/2020 11/11/2020  Total Protein 6.5 - 8.1 g/dL 7.0 6.4(L) 6.0(L)  Albumin 3.5 - 5.0 g/dL 3.0(L) 2.7(L) 2.7(L)  AST 15 - 41 U/L _0 ALT 0 -  44 U/L _0 Alk Phosphatase 38 - 126 U/L 51 42 56  Total Bilirubin 0.3 - 1.2 mg/dL 1.2 1.0 1.2    Lab Results  Component Value Date/Time   TSH 3.259 11/12/2020 09:00 AM   TSH 1.838 11/11/2020 06:45 PM   FREET4 0.76 06/20/2020 10:01 AM   FREET4 1.07 10/18/2018 09:59 AM    CBC Latest Ref Rng & Units 11/23/2020 11/19/2020 11/18/2020  WBC 4.0 - 10.5 K/uL 4.9 4.9 5.4  Hemoglobin 13.0 - 17.0 g/dL 10.3(L) 11.2(L) 10.1(L)  Hematocrit 39.0 - 52.0 % 32.0(L) 34.2(L) 31.0(L)  Platelets 150 - 400 K/uL 146(L) 176 181    No results found for:  VD25OH  Clinical ASCVD: {YES/NO:21197} The ASCVD Risk score Mikey Bussing DC Jr., et al., 2013) failed to calculate for the following reasons:   The valid total cholesterol range is 130 to 320 mg/dL    Depression screen [facility] 2/9 12/30/2020 11/25/2020 09/22/2020  Decreased Interest 0 0 0  Down, Depressed, Hopeless 0 0 0  PHQ - 2 Score 0 0 0     ***Other: (CHADS2VASc if Afib, MMRC or CAT for COPD, ACT, DEXA)  Social History   Tobacco Use  Smoking Status Former Smoker  . Quit date: 02/24/1997  . Years since quitting: 23.8  Smokeless Tobacco Never Used   BP Readings from Last 3 Encounters:  12/30/20 112/68  12/16/20 (!) 85/46  12/02/20 (!) 148/66   Pulse Readings from Last 3 Encounters:  12/30/20 80  12/16/20 62  12/02/20 (!) 116   Wt Readings from Last 3 Encounters:  12/30/20 152 lb 3.2 oz (69 kg)  12/02/20 180 lb (81.6 kg)  11/11/20 180 lb 12.4 oz (82 kg)   BMI Readings from Last 3 Encounters:  12/30/20 21.84 kg/m  12/02/20 25.83 kg/m  11/11/20 25.94 kg/m    Assessment/Interventions: Review of patient past medical history, allergies, medications, health status, including review of consultants reports, laboratory and other test data, was performed as part of comprehensive evaluation and provision of chronic care management services.   SDOH:  (Social Determinants of Health) assessments and interventions performed: {yes/no:20286}  SDOH Screenings   Alcohol Screen: Not on file  Depression (PHQ2-9): Low Risk   . PHQ-2 Score: 0  Financial Resource Strain: Not on file  Food Insecurity: No Food Insecurity  . Worried About Charity fundraiser in the Last Year: Never true  . Ran Out of Food in the Last Year: Never true  Housing: Low Risk   . Last Housing Risk Score: 0  Physical Activity: Not on file  Social Connections: Not on file  Stress: Not on file  Tobacco Use: Medium Risk  . Smoking Tobacco Use: Former Smoker  . Smokeless Tobacco Use: Never Used  Transportation Needs:  No Transportation Needs  . Lack of Transportation (Medical): No  . Lack of Transportation (Non-Medical): No    CCM Care Plan  Allergies  Allergen Reactions  . Penicillins Rash and Other (See Comments)    Rash in and around the mouth Has patient had a PCN reaction causing immediate rash, facial/tongue/throat swelling, SOB or lightheadedness with hypotension: Yes Has patient had a PCN reaction causing severe rash involving mucus membranes or skin necrosis: Yes Has patient had a PCN reaction that required hospitalization: No Has patient had a PCN reaction occurring within the last 10 years: Yes If all of the above answers are "NO", then may proceed with Cephalosporin use.   . Bactrim [Sulfamethoxazole-Trimethoprim] Other (See Comments)  Mouth sores, Sores develop in mouth    Medications Reviewed Today    Reviewed by Edd Arbour, CMA (Certified Medical Assistant) on 12/30/20 at [phone]  Med List Status: <None>  Medication Order Taking? Sig Documenting Provider Last Dose Status Informant  aspirin EC 81 MG tablet [id number] Yes Take 81 mg by mouth daily. [provider] Taking Active Self  Calcium Carbonate-Vitamin D (CALCIUM PLUS VITAMIN D PO) [id number] Yes Take 1 tablet by mouth daily. [provider] Taking Active Self  clopidogrel (PLAVIX) 75 MG tablet [id number] Yes Take 1 tablet (75 mg total) by mouth daily. Kris Hartmann, NP Taking Active   gabapentin (NEURONTIN) 300 MG capsule [id number] Yes Take 1 capsule (300 mg total) by mouth 3 (three) times daily. [name], MD Taking Active   glucose blood (ACCU-CHEK GUIDE) test strip [id number] Yes Use as instructed Ronnell Freshwater, NP Taking Active Self  glyBURIDE (DIABETA) 5 MG tablet [id number] Yes Take 1 tablet (5 mg total) by mouth 2 (two) times daily with a meal. [name], MD Taking Active   Lancets [facility] SOFT [facility]) lancets [id number] Yes Use as instructed Ronnell Freshwater, NP Taking Active Self   lovastatin (MEVACOR) 10 MG tablet [id number] Yes Take 1 tab  Po QHS Boscia, Heather E, NP Taking Active Self  meloxicam (MOBIC) 15 MG tablet [id number] Yes Take by mouth. [provider] Taking Active Self  mometasone-formoterol (DULERA) 100-5 MCG/ACT Hollie Salk [id number] Yes Inhale 2 puffs into the lungs 2 (two) times daily as needed for wheezing. [provider] Taking Active Self  ondansetron (ZOFRAN-ODT) 4 MG disintegrating tablet [id number] Yes Take 1 tablet (4 mg total) by mouth every 8 (eight) hours as needed for nausea or vomiting. [name], MD Taking Active   oxybutynin [facility]) 5 MG tablet [id number] Yes Take 1 tablet (5 mg total) by mouth 2 (two) times daily. [name], MD Taking Active   traMADol Veatrice Bourbon) 50 MG tablet [id number] Yes TAKE 1 TABLET BY MOUTH EVERY 12 HOURS ASNEEDED FOR PAIN Luiz Ochoa, NP Taking Active   vancomycin IVPB [id number] No Inject 1,000 mg into the vein every 12 (twelve) hours. Indication:  Enterococcus and MSSA bacteremia, Left foot infection First Dose: Yes Last Day of Therapy:  12/28/20 Labs - Monday:  CBC/D, CMP, and vancomycin trough. Labs - Thursday:  BMP and vancomycin trough Remove PIC line with completion of antibiotics Method of administration:Elastomeric Method of administration may be changed at the discretion of the patient and/or caregiver's ability to self-administer the medication ordered.  Patient not taking: Reported on 12/30/2020   Fritzi Mandes, MD Not Taking Active           Patient Active Problem List   Diagnosis Date Noted  . Bacteremia due to Enterococcus   . Bacteremia due to methicillin susceptible Staphylococcus aureus (MSSA)   . Diabetic foot infection ([place])   . Sepsis ([place]) 11/11/2020  . Diabetic foot ulcer ([place]) 11/11/2020  . Moderate asthma without complication 52/04/222  . Bilateral impacted cerumen 08/09/2020  . Acute otitis externa of both ears 08/09/2020  . Primary generalized (osteo)arthritis  03/20/2020  . Dysuria 06/15/2019  . Atherosclerosis of artery of extremity with ulceration ([place]) 11/30/2018  . Need for vaccination against Streptococcus pneumoniae using pneumococcal conjugate vaccine 13 10/26/2018  . Overactive bladder 10/26/2018  . Atherosclerosis of native arteries of the extremities with gangrene ([place]) 10/25/2018  . Sore throat 04/03/2018  . Oral candidiasis 04/03/2018  . Uncontrolled type 2  diabetes mellitus with hyperglycemia ([place]) 03/06/2018  . Acute non-recurrent pansinusitis 03/06/2018  . Primary osteoarthritis of shoulders, bilateral 03/06/2018  . GERD (gastroesophageal reflux disease) 11/03/2017  . Carotid stenosis, right 05/12/2017  . Encounter for general adult medical examination with abnormal findings 03/25/2017  . PAD (peripheral artery disease) ([place]) 03/15/2017  . Bilateral carotid artery stenosis 03/15/2017  . Cardiomyopathy, nonischemic ([place]) 07/28/2015  . Chest pain with low risk for cardiac etiology 12/04/2014  . Paroxysmal SVT (supraventricular tachycardia) ([place]) 06/13/2014  . Diabetes ([place]) 06/13/2014  . Mixed hyperlipidemia 06/13/2014  . Essential hypertension 06/13/2014  . Arthritis 06/13/2014  . H/O seasonal allergies 06/13/2014    Immunization History  Administered Date(s) Administered  . Influenza, High Dose Seasonal PF 06/06/2019  . Influenza-Unspecified 07/01/2020  . Moderna Sars-Covid-2 Vaccination 01/22/2020, 02/19/2020, 07/01/2020  . Pneumococcal Polysaccharide-23 06/19/2020    Conditions to be addressed/monitored:  HTN, GERD, Diabetes, Paroxysmal SVT, Arthritis, HLD/CAD, Asthma  There are no care plans that you recently modified to display for this patient.    Medication Assistance: {MEDASSISTANCEINFO:25044}  Patient's preferred pharmacy is:  [facility], [place] [address] Phone: [phone] Fax: [phone]  Uses pill box? {Yes or If no, why not?:20788} Pt  endorses ***% compliance  We discussed: {Pharmacy options:24294} Patient decided to: {US Pharmacy Plan:23885}  Care Plan and Follow Up Patient Decision:  {FOLLOWUP:24991}  Plan: {CM FOLLOW UP PLAN:25073}  *** Current Barriers:  . {pharmacybarriers:24917} . ***  Pharmacist Clinical Goal(s):  Marland Kitchen Patient will {PHARMACYGOALCHOICES:24921} through collaboration with PharmD and provider.  . ***  Interventions: . 1:1 collaboration with [name], MD regarding development and update of comprehensive plan of care as evidenced by provider attestation and co-signature . Inter-disciplinary care team collaboration (see longitudinal plan of care) . Comprehensive medication review performed; medication list updated in electronic medical record  Hypertension (BP goal {CHL HP [facility] Pharmacist BP [phone]}) -{US controlled/uncontrolled:25276} -Current treatment: . *** -Medications previously tried: ***  -Current home readings: *** -Current dietary habits: *** -Current exercise habits: *** -{ACTIONS;DENIES/REPORTS:21021675::"Denies"} hypotensive/hypertensive symptoms -Educated on {CCM BP Counseling:25124} -Counseled to monitor BP at home ***, document, and provide log at future appointments -{CCMPHARMDINTERVENTION:25122}  Hyperlipidemia/CAD: (LDL goal < ***) -{US controlled/uncontrolled:25276} -Current treatment: . ASA 58m . Lovastatin 142m. Clopidogrel 7523mMedications previously tried: ***  -Current dietary patterns: *** -Current exercise habits: *** -Educated on {CCM HLD Counseling:25126} -{CCMPHARMDINTERVENTION:25122}  Diabetes (A1c goal {A1c goals:23924}) -{US controlled/uncontrolled:25276} -Current medications: . Glyburide 5mg57mD with a meal -Medications previously tried: ***  -Current home glucose readings . fasting glucose: *** . post prandial glucose: *** -{ACTIONS;DENIES/REPORTS:21021675::"Denies"} hypoglycemic/hyperglycemic symptoms -Current meal  patterns:  . breakfast: ***  . lunch: ***  . dinner: *** . snacks: *** . drinks: *** -Current exercise: *** -Educated on {CCM DM COUNSELING:25123} -Counseled to check feet daily and get yearly eye exams -{CCMPHARMDINTERVENTION:25122}  GERD (Goal: ***) -{US controlled/uncontrolled:25276} -Current treatment  . *** -Medications previously tried: ***  -{CCMPHARMDINTERVENTION:25122}  Arthritis (Goal: ***) -{US controlled/uncontrolled:25276} -Current treatment  . Meloxican 15mg74mly . Tramadol 50mg 78mprn -Medications previously tried: ***  -{CCMPHARMDINTERVENTION:25122}  SVT (Goal: ***) -{US controlled/uncontrolled:25276} -Current treatment  . *** -Medications previously tried: ***  -{CCMPHARMDINTERVENTION:25122}  Asthma (Goal: ***) -{US controlled/uncontrolled:25276} -Current treatment  . Dulera 100-5mcg 230mffs BID -Medications previously tried: ***  -{CCMPHARMDINTERVENTION:25122}   Patient Goals/Self-Care Activities . Patient will:  - {pharmacypatientgoals:24919}  Follow Up Plan: {CM FOLLOW UP PLAN:22BJYN:82956}

## 2020-12-31 ENCOUNTER — Encounter: Payer: Self-pay | Admitting: Hospice and Palliative Medicine

## 2020-12-31 DIAGNOSIS — L97421 Non-pressure chronic ulcer of left heel and midfoot limited to breakdown of skin: Secondary | ICD-10-CM | POA: Diagnosis not present

## 2020-12-31 DIAGNOSIS — E1142 Type 2 diabetes mellitus with diabetic polyneuropathy: Secondary | ICD-10-CM | POA: Diagnosis not present

## 2020-12-31 DIAGNOSIS — A4101 Sepsis due to Methicillin susceptible Staphylococcus aureus: Secondary | ICD-10-CM | POA: Diagnosis not present

## 2020-12-31 DIAGNOSIS — I509 Heart failure, unspecified: Secondary | ICD-10-CM | POA: Diagnosis not present

## 2020-12-31 DIAGNOSIS — I110 Hypertensive heart disease with heart failure: Secondary | ICD-10-CM | POA: Diagnosis not present

## 2020-12-31 DIAGNOSIS — J449 Chronic obstructive pulmonary disease, unspecified: Secondary | ICD-10-CM | POA: Diagnosis not present

## 2020-12-31 DIAGNOSIS — E1151 Type 2 diabetes mellitus with diabetic peripheral angiopathy without gangrene: Secondary | ICD-10-CM | POA: Diagnosis not present

## 2020-12-31 DIAGNOSIS — M19011 Primary osteoarthritis, right shoulder: Secondary | ICD-10-CM | POA: Diagnosis not present

## 2020-12-31 DIAGNOSIS — E11621 Type 2 diabetes mellitus with foot ulcer: Secondary | ICD-10-CM | POA: Diagnosis not present

## 2021-01-02 ENCOUNTER — Telehealth: Payer: Self-pay | Admitting: Pharmacist

## 2021-01-02 DIAGNOSIS — E1142 Type 2 diabetes mellitus with diabetic polyneuropathy: Secondary | ICD-10-CM | POA: Diagnosis not present

## 2021-01-02 DIAGNOSIS — I509 Heart failure, unspecified: Secondary | ICD-10-CM | POA: Diagnosis not present

## 2021-01-02 DIAGNOSIS — E1151 Type 2 diabetes mellitus with diabetic peripheral angiopathy without gangrene: Secondary | ICD-10-CM | POA: Diagnosis not present

## 2021-01-02 DIAGNOSIS — M19011 Primary osteoarthritis, right shoulder: Secondary | ICD-10-CM | POA: Diagnosis not present

## 2021-01-02 DIAGNOSIS — A4101 Sepsis due to Methicillin susceptible Staphylococcus aureus: Secondary | ICD-10-CM | POA: Diagnosis not present

## 2021-01-02 DIAGNOSIS — L97421 Non-pressure chronic ulcer of left heel and midfoot limited to breakdown of skin: Secondary | ICD-10-CM | POA: Diagnosis not present

## 2021-01-02 DIAGNOSIS — J449 Chronic obstructive pulmonary disease, unspecified: Secondary | ICD-10-CM | POA: Diagnosis not present

## 2021-01-02 DIAGNOSIS — E11621 Type 2 diabetes mellitus with foot ulcer: Secondary | ICD-10-CM | POA: Diagnosis not present

## 2021-01-02 DIAGNOSIS — J439 Emphysema, unspecified: Secondary | ICD-10-CM | POA: Diagnosis not present

## 2021-01-02 DIAGNOSIS — I110 Hypertensive heart disease with heart failure: Secondary | ICD-10-CM | POA: Diagnosis not present

## 2021-01-02 NOTE — Progress Notes (Addendum)
Chronic Care Management Pharmacy Assistant   Name: [name]  MRN: [id number] DOB: [birth year]  Reason for Encounter: Initial Questions   Medications: Outpatient Encounter Medications as of 01/02/2021  Medication Sig   aspirin EC 81 MG tablet Take 81 mg by mouth daily.   Calcium Carbonate-Vitamin D (CALCIUM PLUS VITAMIN D PO) Take 1 tablet by mouth daily.   clopidogrel (PLAVIX) 75 MG tablet Take 1 tablet (75 mg total) by mouth daily.   cyclobenzaprine (FLEXERIL) 10 MG tablet Take 1 tablet (10 mg total) by mouth 3 (three) times daily as needed for muscle spasms.   gabapentin (NEURONTIN) 300 MG capsule Take 1 capsule (300 mg total) by mouth 3 (three) times daily.   glucose blood (ACCU-CHEK GUIDE) test strip Use as instructed   glyBURIDE (DIABETA) 5 MG tablet Take 1 tablet (5 mg total) by mouth 2 (two) times daily with a meal.   Lancets (ACCU-CHEK SOFT TOUCH) lancets Use as instructed   lovastatin (MEVACOR) 10 MG tablet Take 1 tab  Po QHS   meloxicam (MOBIC) 15 MG tablet Take by mouth.   mometasone-formoterol (DULERA) 100-5 MCG/ACT AERO Inhale 2 puffs into the lungs 2 (two) times daily as needed for wheezing.   nystatin (MYCOSTATIN) 100000 UNIT/ML suspension Take 5 mLs (500,000 Units total) by mouth 4 (four) times daily.   ondansetron (ZOFRAN-ODT) 4 MG disintegrating tablet Take 1 tablet (4 mg total) by mouth every 8 (eight) hours as needed for nausea or vomiting.   oxybutynin (DITROPAN) 5 MG tablet Take 1 tablet (5 mg total) by mouth 2 (two) times daily.   traMADol (ULTRAM) 50 MG tablet TAKE 1 TABLET BY MOUTH EVERY 12 HOURS ASNEEDED FOR PAIN   No facility-administered encounter medications on file as of 01/02/2021.    Have you seen any other providers since your last visit? Patient stated no   Any changes in your medications or health? Patient stated no.  Any side effects from any medications? Patient stated no.  Do you have an symptoms or problems not managed by your  medications? Patient stated no but his step son stated he has been having thrush/throat concerns.   Any concerns about your health right now? Patient stated no.  Has your provider asked that you check blood pressure, blood sugar, or follow special diet at home? Patient stated he is suppose to check his blood pressure and blood sugar daily but does not because he gets aggravated with the machines.  Do you get any type of exercise on a regular basis? Patient stated he does not do any exercises because he is connected to a wound vac and is unable to walk and stand for long periods of time.   Can you think of a goal you would like to reach for your health? Patient stated he would like to start back being able to get around and fish, he has been sitting around for months because of the wound vac.   Do you have any problems getting your medications? Patients step son stated no but they would like to start packing to better monitor his medications.   Is there anything that you would like to discuss during the appointment? Patient stated no.  Spoke with the patients step son as well during the call. Called step daughter as well.   Please bring medications and supplements to appointment, patient reminded of face to face appointment on the 12 th at [place], RMA Clinical  Pharmacist Assistant [phone]  5 minutes spent in review, coordination, and documentation.  Reviewed by: Beverly Milch, PharmD Clinical Pharmacist [facility] [phone]

## 2021-01-04 DIAGNOSIS — I110 Hypertensive heart disease with heart failure: Secondary | ICD-10-CM | POA: Diagnosis not present

## 2021-01-04 DIAGNOSIS — E1151 Type 2 diabetes mellitus with diabetic peripheral angiopathy without gangrene: Secondary | ICD-10-CM | POA: Diagnosis not present

## 2021-01-04 DIAGNOSIS — J449 Chronic obstructive pulmonary disease, unspecified: Secondary | ICD-10-CM | POA: Diagnosis not present

## 2021-01-04 DIAGNOSIS — L97421 Non-pressure chronic ulcer of left heel and midfoot limited to breakdown of skin: Secondary | ICD-10-CM | POA: Diagnosis not present

## 2021-01-04 DIAGNOSIS — I509 Heart failure, unspecified: Secondary | ICD-10-CM | POA: Diagnosis not present

## 2021-01-04 DIAGNOSIS — M19011 Primary osteoarthritis, right shoulder: Secondary | ICD-10-CM | POA: Diagnosis not present

## 2021-01-04 DIAGNOSIS — E1142 Type 2 diabetes mellitus with diabetic polyneuropathy: Secondary | ICD-10-CM | POA: Diagnosis not present

## 2021-01-04 DIAGNOSIS — A4101 Sepsis due to Methicillin susceptible Staphylococcus aureus: Secondary | ICD-10-CM | POA: Diagnosis not present

## 2021-01-04 DIAGNOSIS — E11621 Type 2 diabetes mellitus with foot ulcer: Secondary | ICD-10-CM | POA: Diagnosis not present

## 2021-01-05 ENCOUNTER — Other Ambulatory Visit: Payer: Self-pay

## 2021-01-05 ENCOUNTER — Telehealth: Payer: Self-pay | Admitting: Internal Medicine

## 2021-01-05 ENCOUNTER — Telehealth: Payer: Self-pay

## 2021-01-05 DIAGNOSIS — B370 Candidal stomatitis: Secondary | ICD-10-CM

## 2021-01-05 MED ORDER — NYSTATIN 100000 UNIT/ML MT SUSP: 5.0000 mL | Freq: Four times a day (QID) | OROMUCOSAL | 0 refills | Status: DC

## 2021-01-05 MED ORDER — FLUCONAZOLE 150 MG PO TABS: ORAL_TABLET | ORAL | 1 refills | Status: DC

## 2021-01-05 NOTE — Telephone Encounter (Signed)
Pt called c/o still having pain in throat and tongue due to thrush and is asking for more medication.  Spoke to [place] and she sent in fluticasone 150 mg once and repeat in three days if still having issues.  I advised patient that after both doses if he is still having problems that he will need to come in for appt.

## 2021-01-05 NOTE — Progress Notes (Signed)
  Chronic Care Management   Outreach Note  01/05/2021 Name: GUSTAVO DISPENZA MRN: [id number] DOB: 1951/04/08  Referred by: [name], MD Reason for referral : No chief complaint on file.   An unsuccessful telephone outreach was attempted today. The patient was referred to the pharmacist for assistance with care management and care coordination.   Follow Up Plan:   Carley Perdue [facility] Scheduler

## 2021-01-05 NOTE — Telephone Encounter (Signed)
done

## 2021-01-06 ENCOUNTER — Ambulatory Visit: Payer: Self-pay

## 2021-01-06 DIAGNOSIS — M19011 Primary osteoarthritis, right shoulder: Secondary | ICD-10-CM | POA: Diagnosis not present

## 2021-01-06 DIAGNOSIS — A4101 Sepsis due to Methicillin susceptible Staphylococcus aureus: Secondary | ICD-10-CM | POA: Diagnosis not present

## 2021-01-06 DIAGNOSIS — L97421 Non-pressure chronic ulcer of left heel and midfoot limited to breakdown of skin: Secondary | ICD-10-CM | POA: Diagnosis not present

## 2021-01-06 DIAGNOSIS — E11621 Type 2 diabetes mellitus with foot ulcer: Secondary | ICD-10-CM | POA: Diagnosis not present

## 2021-01-06 DIAGNOSIS — I110 Hypertensive heart disease with heart failure: Secondary | ICD-10-CM | POA: Diagnosis not present

## 2021-01-06 DIAGNOSIS — J449 Chronic obstructive pulmonary disease, unspecified: Secondary | ICD-10-CM | POA: Diagnosis not present

## 2021-01-06 DIAGNOSIS — I10 Essential (primary) hypertension: Secondary | ICD-10-CM | POA: Diagnosis not present

## 2021-01-06 DIAGNOSIS — I509 Heart failure, unspecified: Secondary | ICD-10-CM | POA: Diagnosis not present

## 2021-01-06 DIAGNOSIS — E1151 Type 2 diabetes mellitus with diabetic peripheral angiopathy without gangrene: Secondary | ICD-10-CM | POA: Diagnosis not present

## 2021-01-06 DIAGNOSIS — E1142 Type 2 diabetes mellitus with diabetic polyneuropathy: Secondary | ICD-10-CM | POA: Diagnosis not present

## 2021-01-08 ENCOUNTER — Other Ambulatory Visit: Payer: Self-pay | Admitting: Hospice and Palliative Medicine

## 2021-01-08 DIAGNOSIS — J449 Chronic obstructive pulmonary disease, unspecified: Secondary | ICD-10-CM | POA: Diagnosis not present

## 2021-01-08 DIAGNOSIS — I110 Hypertensive heart disease with heart failure: Secondary | ICD-10-CM | POA: Diagnosis not present

## 2021-01-08 DIAGNOSIS — E1142 Type 2 diabetes mellitus with diabetic polyneuropathy: Secondary | ICD-10-CM | POA: Diagnosis not present

## 2021-01-08 DIAGNOSIS — L97421 Non-pressure chronic ulcer of left heel and midfoot limited to breakdown of skin: Secondary | ICD-10-CM | POA: Diagnosis not present

## 2021-01-08 DIAGNOSIS — I509 Heart failure, unspecified: Secondary | ICD-10-CM | POA: Diagnosis not present

## 2021-01-08 DIAGNOSIS — E11621 Type 2 diabetes mellitus with foot ulcer: Secondary | ICD-10-CM | POA: Diagnosis not present

## 2021-01-08 DIAGNOSIS — A4101 Sepsis due to Methicillin susceptible Staphylococcus aureus: Secondary | ICD-10-CM | POA: Diagnosis not present

## 2021-01-08 DIAGNOSIS — M19011 Primary osteoarthritis, right shoulder: Secondary | ICD-10-CM | POA: Diagnosis not present

## 2021-01-08 DIAGNOSIS — E1151 Type 2 diabetes mellitus with diabetic peripheral angiopathy without gangrene: Secondary | ICD-10-CM | POA: Diagnosis not present

## 2021-01-12 DIAGNOSIS — I509 Heart failure, unspecified: Secondary | ICD-10-CM | POA: Diagnosis not present

## 2021-01-12 DIAGNOSIS — I110 Hypertensive heart disease with heart failure: Secondary | ICD-10-CM | POA: Diagnosis not present

## 2021-01-12 DIAGNOSIS — L97423 Non-pressure chronic ulcer of left heel and midfoot with necrosis of muscle: Secondary | ICD-10-CM | POA: Diagnosis not present

## 2021-01-12 DIAGNOSIS — A4101 Sepsis due to Methicillin susceptible Staphylococcus aureus: Secondary | ICD-10-CM | POA: Diagnosis not present

## 2021-01-12 DIAGNOSIS — E1142 Type 2 diabetes mellitus with diabetic polyneuropathy: Secondary | ICD-10-CM | POA: Diagnosis not present

## 2021-01-12 DIAGNOSIS — L97421 Non-pressure chronic ulcer of left heel and midfoot limited to breakdown of skin: Secondary | ICD-10-CM | POA: Diagnosis not present

## 2021-01-12 DIAGNOSIS — E1151 Type 2 diabetes mellitus with diabetic peripheral angiopathy without gangrene: Secondary | ICD-10-CM | POA: Diagnosis not present

## 2021-01-12 DIAGNOSIS — J449 Chronic obstructive pulmonary disease, unspecified: Secondary | ICD-10-CM | POA: Diagnosis not present

## 2021-01-12 DIAGNOSIS — M19011 Primary osteoarthritis, right shoulder: Secondary | ICD-10-CM | POA: Diagnosis not present

## 2021-01-12 DIAGNOSIS — E11621 Type 2 diabetes mellitus with foot ulcer: Secondary | ICD-10-CM | POA: Diagnosis not present

## 2021-01-13 ENCOUNTER — Other Ambulatory Visit: Payer: Medicare HMO

## 2021-01-14 DIAGNOSIS — J449 Chronic obstructive pulmonary disease, unspecified: Secondary | ICD-10-CM | POA: Diagnosis not present

## 2021-01-14 DIAGNOSIS — E1142 Type 2 diabetes mellitus with diabetic polyneuropathy: Secondary | ICD-10-CM | POA: Diagnosis not present

## 2021-01-14 DIAGNOSIS — E1151 Type 2 diabetes mellitus with diabetic peripheral angiopathy without gangrene: Secondary | ICD-10-CM | POA: Diagnosis not present

## 2021-01-14 DIAGNOSIS — A4101 Sepsis due to Methicillin susceptible Staphylococcus aureus: Secondary | ICD-10-CM | POA: Diagnosis not present

## 2021-01-14 DIAGNOSIS — I110 Hypertensive heart disease with heart failure: Secondary | ICD-10-CM | POA: Diagnosis not present

## 2021-01-14 DIAGNOSIS — M19011 Primary osteoarthritis, right shoulder: Secondary | ICD-10-CM | POA: Diagnosis not present

## 2021-01-14 DIAGNOSIS — E11621 Type 2 diabetes mellitus with foot ulcer: Secondary | ICD-10-CM | POA: Diagnosis not present

## 2021-01-14 DIAGNOSIS — I509 Heart failure, unspecified: Secondary | ICD-10-CM | POA: Diagnosis not present

## 2021-01-14 DIAGNOSIS — L97421 Non-pressure chronic ulcer of left heel and midfoot limited to breakdown of skin: Secondary | ICD-10-CM | POA: Diagnosis not present

## 2021-01-15 ENCOUNTER — Other Ambulatory Visit: Payer: Self-pay

## 2021-01-15 NOTE — Patient Instructions (Signed)
Goals Addressed            This Visit's Progress   . Careful Skin Care-wound vac left heel   On track    Timeframe:  Short-Term Goal Priority:  High Start Date:   11/25/20                          Expected End Date:  02/24/21                     Follow Up Date 02/24/21  - clean and dry skin well  -keep wound vac intact -monitor for signs such as redness, swelling or pain to site   Why is this important?     Taking really good care of your skin will help to keep your skin unbroken.    Notes: 11/25/20 Patient reports wound vac intact with no redness or swelling.  12/03/20 Patient to wear wound vac for 2 more weeks. 12/09/20 Wound making progress per patient 12/22/20 Wound making progress 01/15/21 Patient reports some wound closure.  Patient recently saw podiatrist.      . Make and Keep All Appointments   On track    Timeframe:  Long-Range Goal Priority:  High Start Date:    11/25/20                         Expected End Date:    05/27/21                   Follow Up Date 02/24/21   - arrange a ride through an agency 1 week before appointment - keep a calendar with appointment dates    Why is this important?    Part of staying healthy is seeing the doctor for follow-up care.   If you forget your appointments, there are some things you can do to stay on track.    Notes: Patient daughter to make follow up appointments today 11/25/20.  12/03/20 patient seeing physicians as scheduled.  12/09/20 Patient has no problems with transportation.  01/15/21 Patient reports seeing physicians as scheduled.    . Monitor and Manage My Blood Sugar-Diabetes Type 2   Not on track    Timeframe:  Long-Range Goal Priority:  High Start Date:    11/25/20                         Expected End Date:   05/27/21                    Follow Up Date 02/24/21 - check blood sugar at prescribed times - take the blood sugar log to all doctor visits    Why is this important?    Checking your blood sugar at home helps to keep  it from getting very high or very low.   Writing the results in a diary or log helps the doctor know how to care for you.   Your blood sugar log should have the time, date and the results.   Also, write down the amount of insulin or other medicine that you take.   Other information, like what you ate, exercise done and how you were feeling, will also be helpful.     Notes: Patient currently not checking encouraged to check. 12/03/20 Patient not checking sugars currently. Encouraged patient to check sugars. 12/09/20 Patient checked blood sugar it was 223.  12/22/20  PLEASE CHECK YOUR BLOOD SUGARS. 01/15/21 Please check your sugars.

## 2021-01-15 NOTE — Patient Outreach (Signed)
[facility]) Care Management  [place]  01/15/2021   [name] 26-Jan-1951 [id number]  Subjective: Telephone call to patient for follow up. Patient reports he is doing good. He states wound making progress and saw physician this week. He states he is still in the wound vac.  Discussed signs of infection and encouraged no weight bearing on his foot.  He stares he has not. Patient does not regularly check his sugars.  Advised patient on how sugar monitoring and control is important to help with wound closure.  He verbalized understanding.     Objective:   Encounter Medications:  Outpatient Encounter Medications as of 01/15/2021  Medication Sig  . aspirin EC 81 MG tablet Take 81 mg by mouth daily.  . Calcium Carbonate-Vitamin D (CALCIUM PLUS VITAMIN D PO) Take 1 tablet by mouth daily.  . clopidogrel (PLAVIX) 75 MG tablet Take 1 tablet (75 mg total) by mouth daily.  . cyclobenzaprine (FLEXERIL) 10 MG tablet Take 1 tablet (10 mg total) by mouth 3 (three) times daily as needed for muscle spasms.  . fluconazole (DIFLUCAN) 150 MG tablet TAKE 1 TABLET BY MOUTH NOW REPEAT IN 3 DAYS IF NO BETTER  . gabapentin (NEURONTIN) 300 MG capsule Take 1 capsule (300 mg total) by mouth 3 (three) times daily.  Marland Kitchen glucose blood (ACCU-CHEK GUIDE) test strip Use as instructed  . glyBURIDE (DIABETA) 5 MG tablet Take 1 tablet (5 mg total) by mouth 2 (two) times daily with a meal.  . Lancets (ACCU-CHEK SOFT TOUCH) lancets Use as instructed  . lovastatin (MEVACOR) 10 MG tablet Take 1 tab  Po QHS  . meloxicam (MOBIC) 15 MG tablet Take by mouth.  . mometasone-formoterol (DULERA) 100-5 MCG/ACT AERO Inhale 2 puffs into the lungs 2 (two) times daily as needed for wheezing.  . nystatin (MYCOSTATIN) 100000 UNIT/ML suspension Take 5 mLs (500,000 Units total) by mouth 4 (four) times daily.  . ondansetron (ZOFRAN-ODT) 4 MG disintegrating tablet Take 1 tablet (4 mg total) by mouth every 8 (eight)  hours as needed for nausea or vomiting.  Marland Kitchen oxybutynin (DITROPAN) 5 MG tablet Take 1 tablet (5 mg total) by mouth 2 (two) times daily.  . traMADol (ULTRAM) 50 MG tablet TAKE 1 TABLET BY MOUTH EVERY 12 HOURS ASNEEDED FOR PAIN   No facility-administered encounter medications on file as of 01/15/2021.    Functional Status:  In your present state of health, do you have any difficulty performing the following activities: 11/25/2020 11/12/2020  Hearing? N N  Vision? N N  Difficulty concentrating or making decisions? N N  Walking or climbing stairs? Y Y  Comment weakness and foot wound -  Dressing or bathing? N N  Doing errands, shopping? N N  Preparing Food and eating ? N -  Using the Toilet? N -  In the past six months, have you accidently leaked urine? N -  Do you have problems with loss of bowel control? N -  Managing your Medications? N -  Managing your Finances? N -  Housekeeping or managing your Housekeeping? Y -  Comment daughter [name] helps -  Some recent data might be hidden    Fall/Depression Screening: Fall Risk  12/30/2020 11/25/2020 09/22/2020  Falls in the past year? 0 0 0  Number falls in past yr: - - -  Injury with Fall? - - -   PHQ 2/9 Scores 12/30/2020 11/25/2020 09/22/2020 06/20/2020 03/20/2020 12/17/2019 09/17/2019  PHQ - 2 Score 0 0 0  0 0 0 0    Assessment:  Goals Addressed            This Visit's Progress   . Careful Skin Care-wound vac left heel   On track    Timeframe:  Short-Term Goal Priority:  High Start Date:   11/25/20                          Expected End Date:  02/24/21                     Follow Up Date 02/24/21  - clean and dry skin well  -keep wound vac intact -monitor for signs such as redness, swelling or pain to site   Why is this important?     Taking really good care of your skin will help to keep your skin unbroken.    Notes: 11/25/20 Patient reports wound vac intact with no redness or swelling.  12/03/20 Patient to wear wound vac for 2 more  weeks. 12/09/20 Wound making progress per patient 12/22/20 Wound making progress 01/15/21 Patient reports some wound closure.  Patient recently saw podiatrist.      . Make and Keep All Appointments   On track    Timeframe:  Long-Range Goal Priority:  High Start Date:    11/25/20                         Expected End Date:    05/27/21                   Follow Up Date 02/24/21   - arrange a ride through an agency 1 week before appointment - keep a calendar with appointment dates    Why is this important?    Part of staying healthy is seeing the doctor for follow-up care.   If you forget your appointments, there are some things you can do to stay on track.    Notes: Patient daughter to make follow up appointments today 11/25/20.  12/03/20 patient seeing physicians as scheduled.  12/09/20 Patient has no problems with transportation.  01/15/21 Patient reports seeing physicians as scheduled.    . Monitor and Manage My Blood Sugar-Diabetes Type 2   Not on track    Timeframe:  Long-Range Goal Priority:  High Start Date:    11/25/20                         Expected End Date:   05/27/21                    Follow Up Date 02/24/21 - check blood sugar at prescribed times - take the blood sugar log to all doctor visits    Why is this important?    Checking your blood sugar at home helps to keep it from getting very high or very low.   Writing the results in a diary or log helps the doctor know how to care for you.   Your blood sugar log should have the time, date and the results.   Also, write down the amount of insulin or other medicine that you take.   Other information, like what you ate, exercise done and how you were feeling, will also be helpful.     Notes: Patient currently not checking encouraged to check. 12/03/20 Patient not checking sugars currently. Encouraged patient to check  sugars. 12/09/20 Patient checked blood sugar it was 223.   12/22/20  PLEASE CHECK YOUR BLOOD SUGARS. 01/15/21 Please  check your sugars.         Plan: RN CM will outreach again in May.  Follow-up:  Patient agrees to Care Plan and Follow-up.   [name], RN, MSN [place] Management Care Management Coordinator Direct Line [phone] Cell [phone] Toll Free: [phone]  Fax: [phone]

## 2021-01-16 DIAGNOSIS — A4101 Sepsis due to Methicillin susceptible Staphylococcus aureus: Secondary | ICD-10-CM | POA: Diagnosis not present

## 2021-01-16 DIAGNOSIS — L97421 Non-pressure chronic ulcer of left heel and midfoot limited to breakdown of skin: Secondary | ICD-10-CM | POA: Diagnosis not present

## 2021-01-16 DIAGNOSIS — E1142 Type 2 diabetes mellitus with diabetic polyneuropathy: Secondary | ICD-10-CM | POA: Diagnosis not present

## 2021-01-16 DIAGNOSIS — I110 Hypertensive heart disease with heart failure: Secondary | ICD-10-CM | POA: Diagnosis not present

## 2021-01-16 DIAGNOSIS — J449 Chronic obstructive pulmonary disease, unspecified: Secondary | ICD-10-CM | POA: Diagnosis not present

## 2021-01-16 DIAGNOSIS — E1151 Type 2 diabetes mellitus with diabetic peripheral angiopathy without gangrene: Secondary | ICD-10-CM | POA: Diagnosis not present

## 2021-01-16 DIAGNOSIS — I509 Heart failure, unspecified: Secondary | ICD-10-CM | POA: Diagnosis not present

## 2021-01-16 DIAGNOSIS — M19011 Primary osteoarthritis, right shoulder: Secondary | ICD-10-CM | POA: Diagnosis not present

## 2021-01-16 DIAGNOSIS — E11621 Type 2 diabetes mellitus with foot ulcer: Secondary | ICD-10-CM | POA: Diagnosis not present

## 2021-01-19 DIAGNOSIS — M19011 Primary osteoarthritis, right shoulder: Secondary | ICD-10-CM | POA: Diagnosis not present

## 2021-01-19 DIAGNOSIS — E11621 Type 2 diabetes mellitus with foot ulcer: Secondary | ICD-10-CM | POA: Diagnosis not present

## 2021-01-19 DIAGNOSIS — E1151 Type 2 diabetes mellitus with diabetic peripheral angiopathy without gangrene: Secondary | ICD-10-CM | POA: Diagnosis not present

## 2021-01-19 DIAGNOSIS — L97421 Non-pressure chronic ulcer of left heel and midfoot limited to breakdown of skin: Secondary | ICD-10-CM | POA: Diagnosis not present

## 2021-01-19 DIAGNOSIS — A4101 Sepsis due to Methicillin susceptible Staphylococcus aureus: Secondary | ICD-10-CM | POA: Diagnosis not present

## 2021-01-19 DIAGNOSIS — E1142 Type 2 diabetes mellitus with diabetic polyneuropathy: Secondary | ICD-10-CM | POA: Diagnosis not present

## 2021-01-19 DIAGNOSIS — I509 Heart failure, unspecified: Secondary | ICD-10-CM | POA: Diagnosis not present

## 2021-01-19 DIAGNOSIS — I110 Hypertensive heart disease with heart failure: Secondary | ICD-10-CM | POA: Diagnosis not present

## 2021-01-19 DIAGNOSIS — J449 Chronic obstructive pulmonary disease, unspecified: Secondary | ICD-10-CM | POA: Diagnosis not present

## 2021-01-21 DIAGNOSIS — E1151 Type 2 diabetes mellitus with diabetic peripheral angiopathy without gangrene: Secondary | ICD-10-CM | POA: Diagnosis not present

## 2021-01-21 DIAGNOSIS — J449 Chronic obstructive pulmonary disease, unspecified: Secondary | ICD-10-CM | POA: Diagnosis not present

## 2021-01-21 DIAGNOSIS — L97421 Non-pressure chronic ulcer of left heel and midfoot limited to breakdown of skin: Secondary | ICD-10-CM | POA: Diagnosis not present

## 2021-01-21 DIAGNOSIS — A4101 Sepsis due to Methicillin susceptible Staphylococcus aureus: Secondary | ICD-10-CM | POA: Diagnosis not present

## 2021-01-21 DIAGNOSIS — I509 Heart failure, unspecified: Secondary | ICD-10-CM | POA: Diagnosis not present

## 2021-01-21 DIAGNOSIS — I110 Hypertensive heart disease with heart failure: Secondary | ICD-10-CM | POA: Diagnosis not present

## 2021-01-21 DIAGNOSIS — M19011 Primary osteoarthritis, right shoulder: Secondary | ICD-10-CM | POA: Diagnosis not present

## 2021-01-21 DIAGNOSIS — E1142 Type 2 diabetes mellitus with diabetic polyneuropathy: Secondary | ICD-10-CM | POA: Diagnosis not present

## 2021-01-21 DIAGNOSIS — E11621 Type 2 diabetes mellitus with foot ulcer: Secondary | ICD-10-CM | POA: Diagnosis not present

## 2021-01-22 DIAGNOSIS — L089 Local infection of the skin and subcutaneous tissue, unspecified: Secondary | ICD-10-CM | POA: Diagnosis not present

## 2021-01-22 DIAGNOSIS — T8189XA Other complications of procedures, not elsewhere classified, initial encounter: Secondary | ICD-10-CM | POA: Diagnosis not present

## 2021-01-23 DIAGNOSIS — E1151 Type 2 diabetes mellitus with diabetic peripheral angiopathy without gangrene: Secondary | ICD-10-CM | POA: Diagnosis not present

## 2021-01-23 DIAGNOSIS — M19011 Primary osteoarthritis, right shoulder: Secondary | ICD-10-CM | POA: Diagnosis not present

## 2021-01-23 DIAGNOSIS — E1142 Type 2 diabetes mellitus with diabetic polyneuropathy: Secondary | ICD-10-CM | POA: Diagnosis not present

## 2021-01-23 DIAGNOSIS — I509 Heart failure, unspecified: Secondary | ICD-10-CM | POA: Diagnosis not present

## 2021-01-23 DIAGNOSIS — A4101 Sepsis due to Methicillin susceptible Staphylococcus aureus: Secondary | ICD-10-CM | POA: Diagnosis not present

## 2021-01-23 DIAGNOSIS — J449 Chronic obstructive pulmonary disease, unspecified: Secondary | ICD-10-CM | POA: Diagnosis not present

## 2021-01-23 DIAGNOSIS — L97421 Non-pressure chronic ulcer of left heel and midfoot limited to breakdown of skin: Secondary | ICD-10-CM | POA: Diagnosis not present

## 2021-01-23 DIAGNOSIS — I110 Hypertensive heart disease with heart failure: Secondary | ICD-10-CM | POA: Diagnosis not present

## 2021-01-23 DIAGNOSIS — E11621 Type 2 diabetes mellitus with foot ulcer: Secondary | ICD-10-CM | POA: Diagnosis not present

## 2021-01-24 DIAGNOSIS — I10 Essential (primary) hypertension: Secondary | ICD-10-CM | POA: Diagnosis not present

## 2021-01-24 DIAGNOSIS — E1165 Type 2 diabetes mellitus with hyperglycemia: Secondary | ICD-10-CM | POA: Diagnosis not present

## 2021-01-24 DIAGNOSIS — K219 Gastro-esophageal reflux disease without esophagitis: Secondary | ICD-10-CM | POA: Diagnosis not present

## 2021-01-26 DIAGNOSIS — L97421 Non-pressure chronic ulcer of left heel and midfoot limited to breakdown of skin: Secondary | ICD-10-CM | POA: Diagnosis not present

## 2021-01-26 DIAGNOSIS — I509 Heart failure, unspecified: Secondary | ICD-10-CM | POA: Diagnosis not present

## 2021-01-26 DIAGNOSIS — M19011 Primary osteoarthritis, right shoulder: Secondary | ICD-10-CM | POA: Diagnosis not present

## 2021-01-26 DIAGNOSIS — M19012 Primary osteoarthritis, left shoulder: Secondary | ICD-10-CM | POA: Diagnosis not present

## 2021-01-26 DIAGNOSIS — E1142 Type 2 diabetes mellitus with diabetic polyneuropathy: Secondary | ICD-10-CM | POA: Diagnosis not present

## 2021-01-26 DIAGNOSIS — E11621 Type 2 diabetes mellitus with foot ulcer: Secondary | ICD-10-CM | POA: Diagnosis not present

## 2021-01-26 DIAGNOSIS — E1151 Type 2 diabetes mellitus with diabetic peripheral angiopathy without gangrene: Secondary | ICD-10-CM | POA: Diagnosis not present

## 2021-01-26 DIAGNOSIS — I110 Hypertensive heart disease with heart failure: Secondary | ICD-10-CM | POA: Diagnosis not present

## 2021-01-26 DIAGNOSIS — J449 Chronic obstructive pulmonary disease, unspecified: Secondary | ICD-10-CM | POA: Diagnosis not present

## 2021-01-28 ENCOUNTER — Other Ambulatory Visit: Payer: Self-pay

## 2021-01-28 ENCOUNTER — Ambulatory Visit (INDEPENDENT_AMBULATORY_CARE_PROVIDER_SITE_OTHER): Payer: Medicare HMO | Admitting: Hospice and Palliative Medicine

## 2021-01-28 ENCOUNTER — Encounter: Payer: Self-pay | Admitting: Hospice and Palliative Medicine

## 2021-01-28 VITALS — BP 119/62 | HR 77 | Temp 98.3°F | Resp 16 | Ht 70.0 in | Wt 155.0 lb

## 2021-01-28 DIAGNOSIS — M19012 Primary osteoarthritis, left shoulder: Secondary | ICD-10-CM | POA: Diagnosis not present

## 2021-01-28 DIAGNOSIS — I509 Heart failure, unspecified: Secondary | ICD-10-CM | POA: Diagnosis not present

## 2021-01-28 DIAGNOSIS — I110 Hypertensive heart disease with heart failure: Secondary | ICD-10-CM | POA: Diagnosis not present

## 2021-01-28 DIAGNOSIS — M542 Cervicalgia: Secondary | ICD-10-CM | POA: Diagnosis not present

## 2021-01-28 DIAGNOSIS — G8929 Other chronic pain: Secondary | ICD-10-CM | POA: Diagnosis not present

## 2021-01-28 DIAGNOSIS — E1142 Type 2 diabetes mellitus with diabetic polyneuropathy: Secondary | ICD-10-CM | POA: Diagnosis not present

## 2021-01-28 DIAGNOSIS — E1151 Type 2 diabetes mellitus with diabetic peripheral angiopathy without gangrene: Secondary | ICD-10-CM | POA: Diagnosis not present

## 2021-01-28 DIAGNOSIS — E1165 Type 2 diabetes mellitus with hyperglycemia: Secondary | ICD-10-CM

## 2021-01-28 DIAGNOSIS — M19011 Primary osteoarthritis, right shoulder: Secondary | ICD-10-CM | POA: Diagnosis not present

## 2021-01-28 DIAGNOSIS — R1310 Dysphagia, unspecified: Secondary | ICD-10-CM | POA: Diagnosis not present

## 2021-01-28 DIAGNOSIS — R0902 Hypoxemia: Secondary | ICD-10-CM

## 2021-01-28 DIAGNOSIS — L97421 Non-pressure chronic ulcer of left heel and midfoot limited to breakdown of skin: Secondary | ICD-10-CM | POA: Diagnosis not present

## 2021-01-28 DIAGNOSIS — E11621 Type 2 diabetes mellitus with foot ulcer: Secondary | ICD-10-CM | POA: Diagnosis not present

## 2021-01-28 DIAGNOSIS — J449 Chronic obstructive pulmonary disease, unspecified: Secondary | ICD-10-CM | POA: Diagnosis not present

## 2021-01-28 LAB — POCT GLYCOSYLATED HEMOGLOBIN (HGB A1C): Hemoglobin A1C: 7.1 % — AB (ref 4.0–5.6)

## 2021-01-28 MED ORDER — TRAMADOL HCL 50 MG PO TABS: ORAL_TABLET | ORAL | 1 refills | Status: DC

## 2021-01-28 MED ORDER — MOMETASONE FURO-FORMOTEROL FUM 100-5 MCG/ACT IN AERO: 2.0000 | INHALATION_SPRAY | Freq: Two times a day (BID) | RESPIRATORY_TRACT | 3 refills | Status: DC | PRN

## 2021-01-28 NOTE — Progress Notes (Signed)
[facility] [place], [place] [address]  Internal MEDICINE  Office Visit Note  Patient Name: [name]  [id number]  [id number]  Date of Service: 01/29/2021  Chief Complaint  Patient presents with  . COPD  . Follow-up    Med refills  . Gastroesophageal Reflux  . Diabetes  . Hypertension  . Hyperlipidemia    HPI Patient is here for routine follow-up [name] has resolved Has not yet been scheduled for UGI Wound vac has been removed--has follow-up with foot doctor on Monday, able to wear boot brace and is ambulating with walker Feeling better now that he is able to move around and be more active Recently lost his wife and is now living alone--daughter is close by and checks in on him frequently   Current Medication: Outpatient Encounter Medications as of 01/28/2021  Medication Sig  . aspirin EC 81 MG tablet Take 81 mg by mouth daily.  . Calcium Carbonate-Vitamin D (CALCIUM PLUS VITAMIN D PO) Take 1 tablet by mouth daily.  . clopidogrel (PLAVIX) 75 MG tablet Take 1 tablet (75 mg total) by mouth daily.  . cyclobenzaprine (FLEXERIL) 10 MG tablet Take 1 tablet (10 mg total) by mouth 3 (three) times daily as needed for muscle spasms.  . fluconazole (DIFLUCAN) 150 MG tablet TAKE 1 TABLET BY MOUTH NOW REPEAT IN 3 DAYS IF NO BETTER  . gabapentin (NEURONTIN) 300 MG capsule Take 1 capsule (300 mg total) by mouth 3 (three) times daily.  Marland Kitchen glucose blood (ACCU-CHEK GUIDE) test strip Use as instructed  . glyBURIDE (DIABETA) 5 MG tablet Take 1 tablet (5 mg total) by mouth 2 (two) times daily with a meal.  . Lancets (ACCU-CHEK SOFT TOUCH) lancets Use as instructed  . lovastatin (MEVACOR) 10 MG tablet Take 1 tab  Po QHS  . meloxicam (MOBIC) 15 MG tablet Take by mouth.  . mometasone-formoterol (DULERA) 100-5 MCG/ACT AERO Inhale 2 puffs into the lungs 2 (two) times daily as needed for wheezing.  . nystatin (MYCOSTATIN) 100000 UNIT/ML suspension Take 5 mLs (500,000 Units  total) by mouth 4 (four) times daily.  . ondansetron (ZOFRAN-ODT) 4 MG disintegrating tablet Take 1 tablet (4 mg total) by mouth every 8 (eight) hours as needed for nausea or vomiting.  Marland Kitchen oxybutynin (DITROPAN) 5 MG tablet Take 1 tablet (5 mg total) by mouth 2 (two) times daily.  . traMADol (ULTRAM) 50 MG tablet TAKE 1 TABLET BY MOUTH EVERY 12 HOURS ASNEEDED FOR PAIN  . [DISCONTINUED] mometasone-formoterol (DULERA) 100-5 MCG/ACT AERO Inhale 2 puffs into the lungs 2 (two) times daily as needed for wheezing.  . [DISCONTINUED] traMADol (ULTRAM) 50 MG tablet TAKE 1 TABLET BY MOUTH EVERY 12 HOURS ASNEEDED FOR PAIN   No facility-administered encounter medications on file as of 01/28/2021.    Surgical History: Past Surgical History:  Procedure Laterality Date  . AMPUTATION TOE Left 02/25/2017   Procedure: AMPUTATION TOE-LEFT 2ND MPJ;  Surgeon: Samara Deist, DPM;  Location: [facility] ORS;  Service: Podiatry;  Laterality: Left;  . AMPUTATION TOE Left 10/20/2018   Procedure: TOE MPJ T2 LEFT;  Surgeon: Samara Deist, DPM;  Location: [facility] ORS;  Service: Podiatry;  Laterality: Left;  . AMPUTATION TOE Left 01/12/2019   Procedure: AMPUTATION LEFT 5TH TOE AND JOINT;  Surgeon: Samara Deist, DPM;  Location: [facility] ORS;  Service: Podiatry;  Laterality: Left;  . BACK SURGERY  2008  . BONE BIOPSY Left 11/21/2020   Procedure: BONE BIOPSY;  Surgeon: Samara Deist, DPM;  Location: [facility] ORS;  Service: Podiatry;  Laterality: Left;  . CHOLECYSTECTOMY    . COLONOSCOPY WITH PROPOFOL N/A 08/15/2017   Procedure: COLONOSCOPY WITH PROPOFOL;  Surgeon: Lollie Sails, MD;  Location: [facility] ENDOSCOPY;  Service: Endoscopy;  Laterality: N/A;  . ENDARTERECTOMY Right 05/12/2017   Procedure: ENDARTERECTOMY CAROTID;  Surgeon: [name], MD;  Location: [facility] ORS;  Service: Vascular;  Laterality: Right;  . ENDARTERECTOMY FEMORAL Left 12/01/2018   Procedure: ENDARTERECTOMY FEMORAL;  Surgeon: [name], MD;  Location: [facility] ORS;  Service:  Vascular;  Laterality: Left;  . ERCP    . FOOT SURGERY Right    x 3  . IRRIGATION AND DEBRIDEMENT FOOT Left 11/21/2020   Procedure: IRRIGATION AND DEBRIDEMENT FOOT--with placement of wound vac;  Surgeon: Samara Deist, DPM;  Location: [facility] ORS;  Service: Podiatry;  Laterality: Left;  . LOWER EXTREMITY ANGIOGRAM Left 12/01/2018   Procedure: LOWER EXTREMITY ANGIOGRAM;  Surgeon: [name], MD;  Location: [facility] ORS;  Service: Vascular;  Laterality: Left;  . LOWER EXTREMITY ANGIOGRAPHY Left 10/30/2018   Procedure: LOWER EXTREMITY ANGIOGRAPHY;  Surgeon: [name], MD;  Location: [facility];  Service: Cardiovascular;  Laterality: Left;  . LOWER EXTREMITY ANGIOGRAPHY Left 11/30/2018   Procedure: LOWER EXTREMITY ANGIOGRAPHY;  Surgeon: [name], MD;  Location: [facility];  Service: Cardiovascular;  Laterality: Left;  . LOWER EXTREMITY ANGIOGRAPHY Left 11/17/2020   Procedure: Lower Extremity Angiography;  Surgeon: [name], MD;  Location: [place];  Service: Cardiovascular;  Laterality: Left;  . TEE WITHOUT CARDIOVERSION N/A 11/14/2020   Procedure: TRANSESOPHAGEAL ECHOCARDIOGRAM (TEE);  Surgeon: Corey Skains, MD;  Location: [facility] ORS;  Service: Cardiovascular;  Laterality: N/A;    Medical History: Past Medical History:  Diagnosis Date  . Cholecystitis, chronic   . Cholelithiasis   . COPD (chronic obstructive pulmonary disease) ([place])   . Diabetes mellitus without complication ([place])   . Diabetic retinopathy ([place])   . Dyspnea   . Fatty liver 2008  . Fracture of clavicle 1992   left   . Fracture of ribs, multiple 1992   3 ribs  . GERD (gastroesophageal reflux disease)   . Hyperlipidemia   . Hypertension   . Paroxysmal SVT (supraventricular tachycardia) ([place])   . Pelvis fracture ([place]) 1992  . Peripheral vascular disease ([place])   . Pleurisy   . Pleurisy   . Seasonal allergies     Family History: Family History  Problem Relation Age of Onset  .  Diabetes Sister     Social History   Socioeconomic History  . Marital status: Single    Spouse name: Not on file  . Number of children: Not on file  . Years of education: Not on file  . Highest education level: Not on file  Occupational History  . Not on file  Tobacco Use  . Smoking status: Former Smoker    Quit date: 02/24/1997    Years since quitting: 23.9  . Smokeless tobacco: Never Used  Vaping Use  . Vaping Use: Never used  Substance and Sexual Activity  . Alcohol use: No  . Drug use: No  . Sexual activity: Not on file  Other Topics Concern  . Not on file  Social History Narrative  . Not on file   Social Determinants of Health   Financial Resource Strain: Not on file  Food Insecurity: No Food Insecurity  . Worried About Charity fundraiser in the Last  Year: Never true  . Ran Out of Food in the Last Year: Never true  Transportation Needs: No Transportation Needs  . Lack of Transportation (Medical): No  . Lack of Transportation (Non-Medical): No  Physical Activity: Not on file  Stress: Not on file  Social Connections: Not on file  Intimate Partner Violence: Not on file      Review of Systems  Constitutional: Negative for chills, fatigue and unexpected weight change.  HENT: Negative for congestion, postnasal drip, rhinorrhea, sneezing and sore throat.   Eyes: Negative for redness.  Respiratory: Negative for cough, chest tightness and shortness of breath.   Cardiovascular: Negative for chest pain and palpitations.  Gastrointestinal: Negative for abdominal pain, constipation, diarrhea, nausea and vomiting.  Genitourinary: Negative for dysuria and frequency.  Musculoskeletal: Negative for arthralgias, back pain, joint swelling and neck pain.  Skin: Negative for rash.  Neurological: Negative for tremors and numbness.  Hematological: Negative for adenopathy. Does not bruise/bleed easily.  Psychiatric/Behavioral: Negative for behavioral problems (Depression),  sleep disturbance and suicidal ideas. The patient is not nervous/anxious.     Vital Signs: BP 119/62   Pulse 77   Temp 98.3 F (36.8 C)   Resp 16   Ht 5\' 10"  (1.778 m)   Wt 155 lb (70.3 kg)   SpO2 91%   BMI 22.24 kg/m    Physical Exam Vitals reviewed.  Constitutional:      Appearance: Normal appearance. He is normal weight.  Cardiovascular:     Rate and Rhythm: Normal rate and regular rhythm.     Pulses: Normal pulses.     Heart sounds: Normal heart sounds.  Pulmonary:     Effort: Pulmonary effort is normal.     Breath sounds: Normal breath sounds.  Abdominal:     General: Abdomen is flat.     Palpations: Abdomen is soft.  Musculoskeletal:        General: Normal range of motion.     Cervical back: Normal range of motion.  Skin:    General: Skin is warm.  Neurological:     General: No focal deficit present.     Mental Status: He is alert and oriented to person, place, and time. Mental status is at baseline.  Psychiatric:        Mood and Affect: Mood normal.        Behavior: Behavior normal.        Thought Content: Thought content normal.        Judgment: Judgment normal.   Assessment/Plan: 1. Uncontrolled type 2 diabetes mellitus with hyperglycemia (HCC) A1C improved since last check at 7.1--continue with current regimen and monitoring - POCT HgB A1C  2. Primary osteoarthritis of shoulders, bilateral May continue with tramadol as needed [place] Controlled Substance Database was reviewed by me for overdose risk score (ORS) Reviewed risks and possible side effects associated with taking opiates, benzodiazepines and other CNS depressants. Combination of these could cause dizziness and drowsiness. Advised patient not to drive or operate machinery when taking these medications, as patient's and other's life can be at risk and will have consequences. Patient verbalized understanding in this matter. Dependence and abuse for these drugs will be monitored closely. A Controlled  substance policy and procedure is on file which allows [facility] to order a urine drug screen test at any visit. Patient understands and agrees with the plan - traMADol (ULTRAM) 50 MG tablet; TAKE 1 TABLET BY MOUTH EVERY 12 HOURS ASNEEDED FOR PAIN  Dispense: 60  tablet; Refill: 1  3. Chronic neck pain Stable, continue with tramadol as needed  4. COPD with hypoxia ([place]) Breathing remains well controlled, utilizes supplemental oxygen when ambulating, requesting refills of Dulera - mometasone-formoterol (DULERA) 100-5 MCG/ACT AERO; Inhale 2 puffs into the lungs 2 (two) times daily as needed for wheezing.  Dispense: 1 each; Refill: 3  5. Dysphagia, unspecified type Will look into getting UGI series scheduled, advised to continue with safe swallowing practices  General Counseling: Shamel verbalizes understanding of the findings of todays visit and agrees with plan of treatment. I have discussed any further diagnostic evaluation that may be needed or ordered today. We also reviewed his medications today. he has been encouraged to call the office with any questions or concerns that should arise related to todays visit.    Orders Placed This Encounter  Procedures  . POCT HgB A1C    Meds ordered this encounter  Medications  . traMADol (ULTRAM) 50 MG tablet    Sig: TAKE 1 TABLET BY MOUTH EVERY 12 HOURS ASNEEDED FOR PAIN    Dispense:  45 tablet    Refill:  1  . mometasone-formoterol (DULERA) 100-5 MCG/ACT AERO    Sig: Inhale 2 puffs into the lungs 2 (two) times daily as needed for wheezing.    Dispense:  1 each    Refill:  3    Time spent: 30 Minutes Time spent includes review of chart, medications, test results and follow-up plan with the patient.  This patient was seen by Theodoro Grist AGNP-C in Collaboration with Dr [name] as a part of collaborative care agreement     Tanna Furry. Chaslyn Eisen AGNP-C Internal medicine

## 2021-01-29 ENCOUNTER — Telehealth: Payer: Self-pay

## 2021-01-29 ENCOUNTER — Encounter: Payer: Self-pay | Admitting: Hospice and Palliative Medicine

## 2021-01-29 NOTE — Telephone Encounter (Signed)
Lmom advising patient of UGI scheduled. [name]

## 2021-01-30 DIAGNOSIS — L97421 Non-pressure chronic ulcer of left heel and midfoot limited to breakdown of skin: Secondary | ICD-10-CM | POA: Diagnosis not present

## 2021-01-30 DIAGNOSIS — M19012 Primary osteoarthritis, left shoulder: Secondary | ICD-10-CM | POA: Diagnosis not present

## 2021-01-30 DIAGNOSIS — I110 Hypertensive heart disease with heart failure: Secondary | ICD-10-CM | POA: Diagnosis not present

## 2021-01-30 DIAGNOSIS — I509 Heart failure, unspecified: Secondary | ICD-10-CM | POA: Diagnosis not present

## 2021-01-30 DIAGNOSIS — J449 Chronic obstructive pulmonary disease, unspecified: Secondary | ICD-10-CM | POA: Diagnosis not present

## 2021-01-30 DIAGNOSIS — M19011 Primary osteoarthritis, right shoulder: Secondary | ICD-10-CM | POA: Diagnosis not present

## 2021-01-30 DIAGNOSIS — E1142 Type 2 diabetes mellitus with diabetic polyneuropathy: Secondary | ICD-10-CM | POA: Diagnosis not present

## 2021-01-30 DIAGNOSIS — E11621 Type 2 diabetes mellitus with foot ulcer: Secondary | ICD-10-CM | POA: Diagnosis not present

## 2021-01-30 DIAGNOSIS — E1151 Type 2 diabetes mellitus with diabetic peripheral angiopathy without gangrene: Secondary | ICD-10-CM | POA: Diagnosis not present

## 2021-02-01 DIAGNOSIS — J439 Emphysema, unspecified: Secondary | ICD-10-CM | POA: Diagnosis not present

## 2021-02-02 DIAGNOSIS — I739 Peripheral vascular disease, unspecified: Secondary | ICD-10-CM | POA: Diagnosis not present

## 2021-02-02 DIAGNOSIS — E11621 Type 2 diabetes mellitus with foot ulcer: Secondary | ICD-10-CM | POA: Diagnosis not present

## 2021-02-02 DIAGNOSIS — R2681 Unsteadiness on feet: Secondary | ICD-10-CM | POA: Diagnosis not present

## 2021-02-02 DIAGNOSIS — L97529 Non-pressure chronic ulcer of other part of left foot with unspecified severity: Secondary | ICD-10-CM | POA: Diagnosis not present

## 2021-02-02 DIAGNOSIS — Z89422 Acquired absence of other left toe(s): Secondary | ICD-10-CM | POA: Diagnosis not present

## 2021-02-02 DIAGNOSIS — L97423 Non-pressure chronic ulcer of left heel and midfoot with necrosis of muscle: Secondary | ICD-10-CM | POA: Diagnosis not present

## 2021-02-02 DIAGNOSIS — B351 Tinea unguium: Secondary | ICD-10-CM | POA: Diagnosis not present

## 2021-02-03 ENCOUNTER — Ambulatory Visit
Admission: RE | Admit: 2021-02-03 | Discharge: 2021-02-03 | Disposition: A | Discharge status: Home / Self Care | Payer: Medicare HMO | Source: Ambulatory Visit | Attending: Hospice and Palliative Medicine | Admitting: Hospice and Palliative Medicine

## 2021-02-03 ENCOUNTER — Other Ambulatory Visit: Payer: Self-pay

## 2021-02-03 DIAGNOSIS — K219 Gastro-esophageal reflux disease without esophagitis: Secondary | ICD-10-CM | POA: Diagnosis not present

## 2021-02-03 DIAGNOSIS — R1310 Dysphagia, unspecified: Secondary | ICD-10-CM | POA: Diagnosis not present

## 2021-02-03 IMAGING — RF DG UGI W/ HIGH DENSITY W/O KUB
11 of 13 series · 14 of 17 positions shown · non-contrast
Comparison: None.

CLINICAL DATA: Dysphagia

EXAM:
UPPER GI SERIES WITHOUT KUB
TECHNIQUE: Routine upper GI series was performed with thin/high density/water
soluble barium.
FLUOROSCOPY TIME:  Fluoroscopy Time:  0.5 minute
Radiation Exposure Index (if provided by the fluoroscopic device):
4.8 mGy
Number of Acquired Spot Images: 0

[Series 1: cp_standard · 0.25mm/px · 3 of 38 frames shown (1 of 10)]
[frame 6/38]
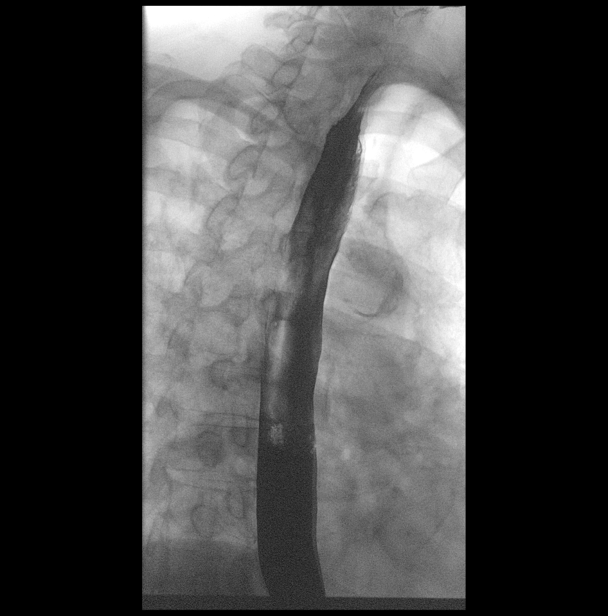
[frame 14/38]
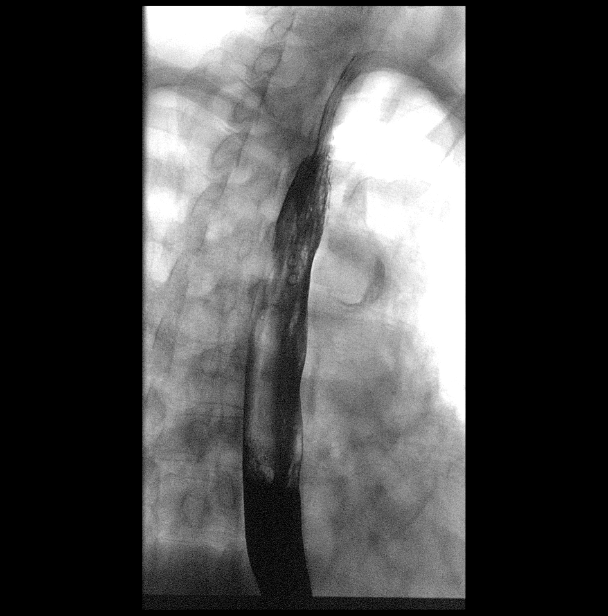
[frame 33/38]
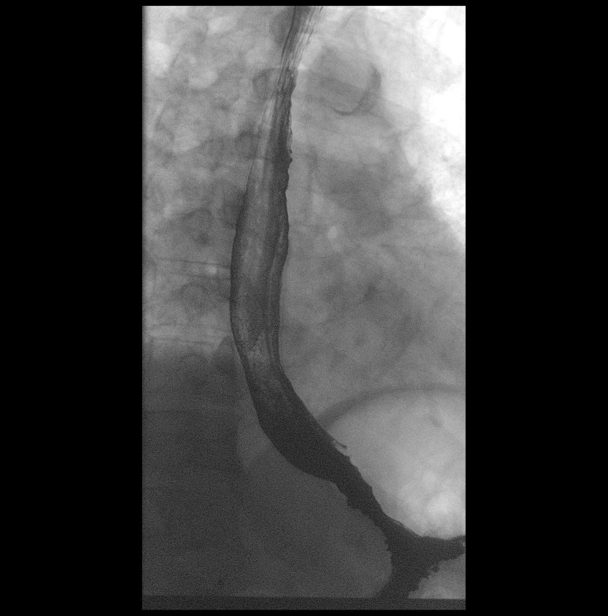

[Series 2: cp_standard · 0.25mm/px · 1 of 1 slices shown (2 of 10)]
[im 1/1]
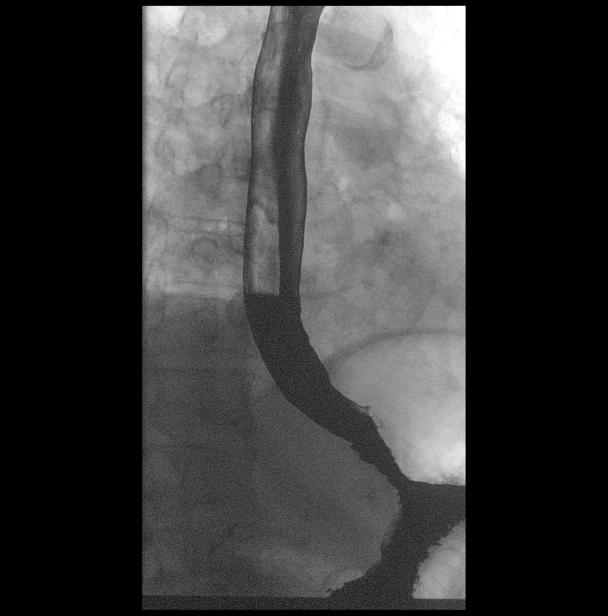

[Series 3: cp_standard · 0.25mm/px · 1 of 1 slices shown (3 of 10)]
[im 1/1]
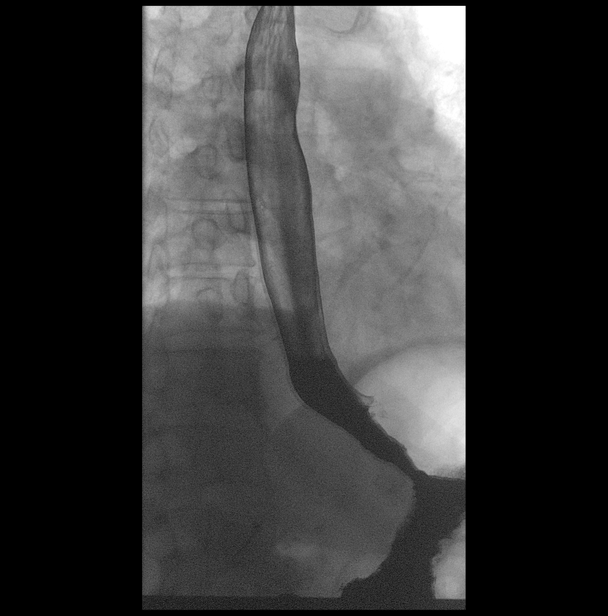

[Series 4: cp_standard · 0.25mm/px · 1 of 1 slices shown (4 of 10)]
[im 1/1]
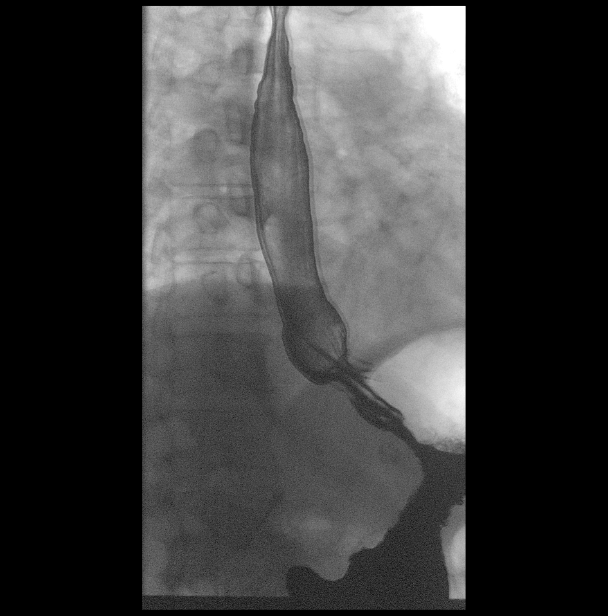

[Series 5: cp_standard · 0.25mm/px · 1 of 1 slices shown (5 of 10)]
[im 1/1]
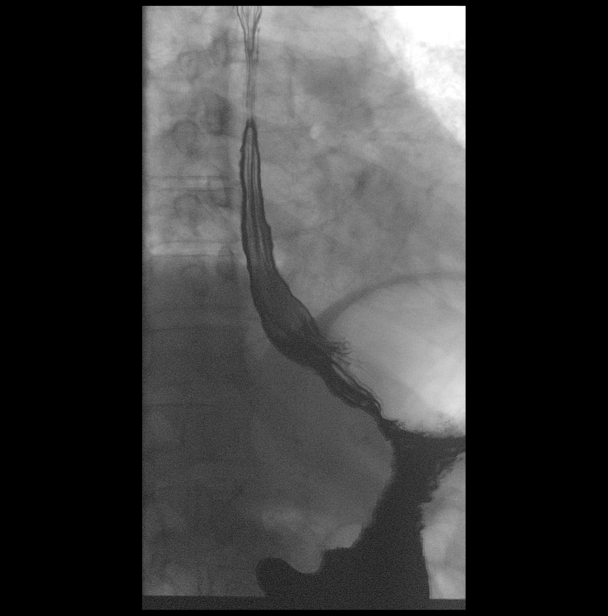

[Series 7: cp_standard · 0.28mm/px · 1 of 1 slices shown (6 of 10)]
[im 1/1]
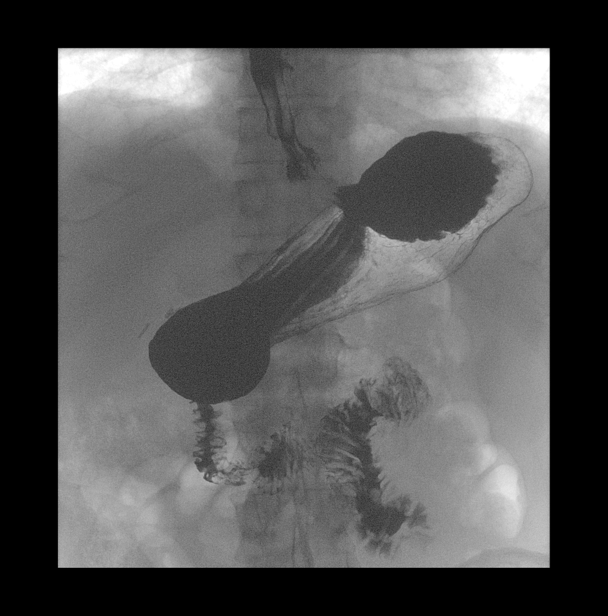

[Series 8: cp_standard · 0.28mm/px · 1 of 1 slices shown (7 of 10)]
[im 1/1]
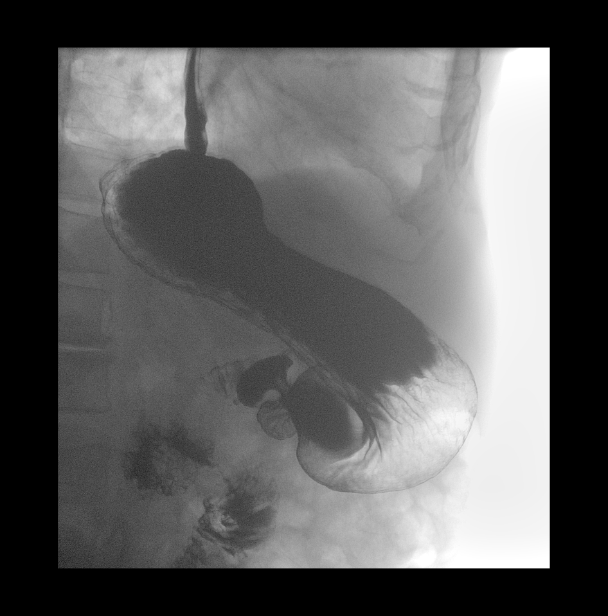

[Series 9: cp_standard · 0.28mm/px · 1 of 1 slices shown (8 of 10)]
[im 1/1]
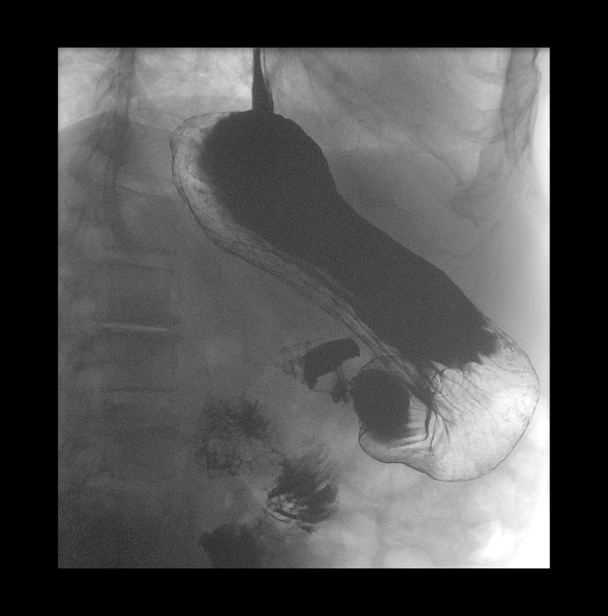

[Series 10: fluoro_barium 2fps_bw · 0.19mm/px · 2 of 2 frames shown]
[frame 1/2]
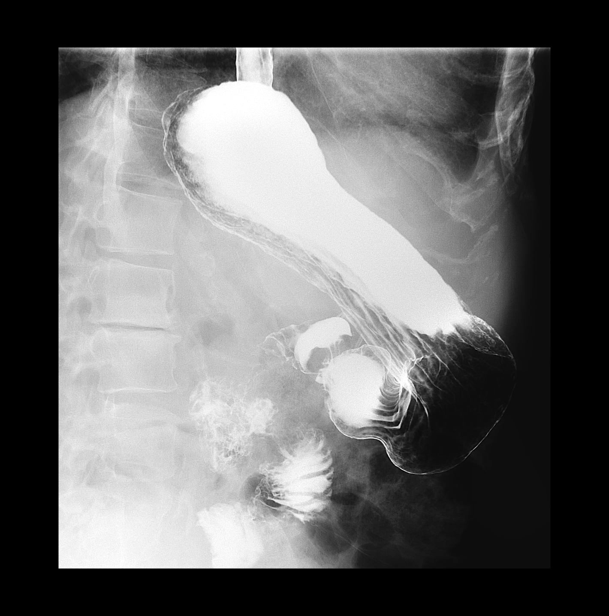
[frame 2/2]
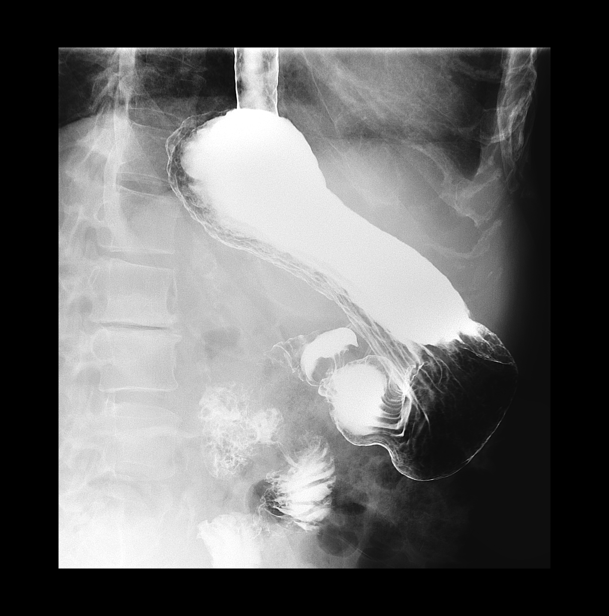

[Series 12: cp_standard · 0.29mm/px · 1 of 1 slices shown (9 of 10)]
[im 1/1]
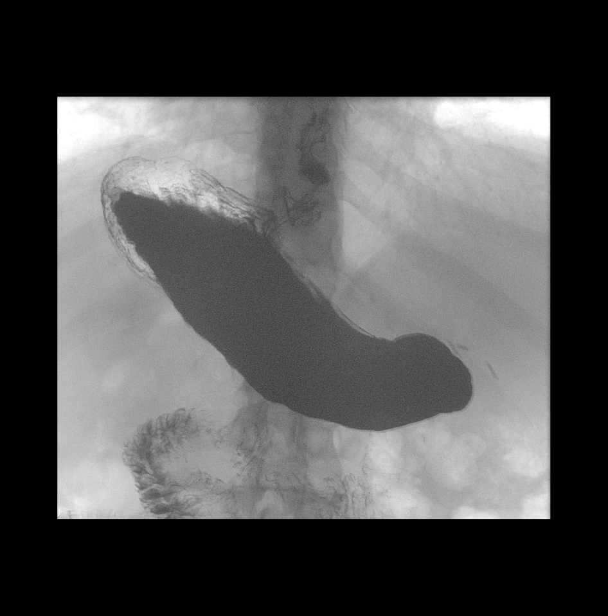

[Series 13: cp_standard · 0.29mm/px · 1 of 1 slices shown (10 of 10)]
[im 1/1]
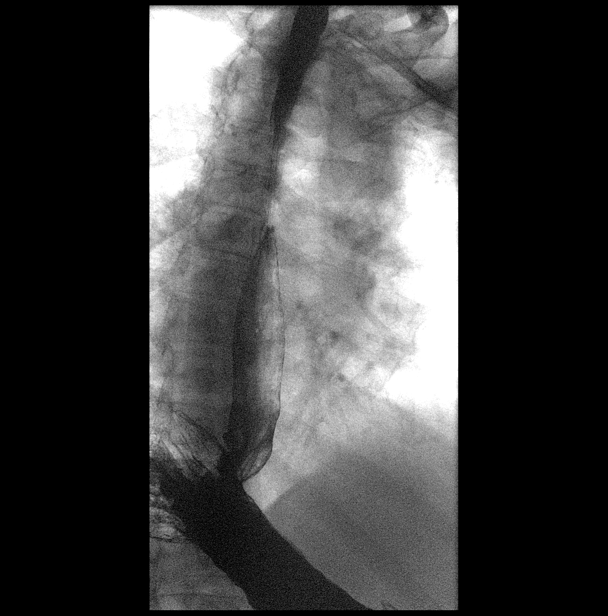

[14 of 17 positions shown; findings below may reference images not displayed]

FINDINGS: Examination of the esophagus demonstrated normal esophageal
motility. Normal esophageal morphology without evidence of
esophagitis or ulceration. No esophageal stricture, diverticula, or
mass lesion. No evidence of hiatal hernia. Moderate gastroesophageal
reflux.

Examination of the stomach demonstrated normal rugal folds and areae
gastricae. The gastric mucosa appeared unremarkable without evidence
of ulceration, scarring, or mass lesion. Gastric motility and
emptying was normal. Fluoroscopic examination of the duodenum
demonstrates normal motility and morphology without evidence of
ulceration or mass lesion.
IMPRESSION: 1. Moderate gastroesophageal reflux.
2. Otherwise normal upper GI.

## 2021-02-04 DIAGNOSIS — I110 Hypertensive heart disease with heart failure: Secondary | ICD-10-CM | POA: Diagnosis not present

## 2021-02-04 DIAGNOSIS — E1151 Type 2 diabetes mellitus with diabetic peripheral angiopathy without gangrene: Secondary | ICD-10-CM | POA: Diagnosis not present

## 2021-02-04 DIAGNOSIS — M19011 Primary osteoarthritis, right shoulder: Secondary | ICD-10-CM | POA: Diagnosis not present

## 2021-02-04 DIAGNOSIS — L97421 Non-pressure chronic ulcer of left heel and midfoot limited to breakdown of skin: Secondary | ICD-10-CM | POA: Diagnosis not present

## 2021-02-04 DIAGNOSIS — E11621 Type 2 diabetes mellitus with foot ulcer: Secondary | ICD-10-CM | POA: Diagnosis not present

## 2021-02-04 DIAGNOSIS — I509 Heart failure, unspecified: Secondary | ICD-10-CM | POA: Diagnosis not present

## 2021-02-04 DIAGNOSIS — E1142 Type 2 diabetes mellitus with diabetic polyneuropathy: Secondary | ICD-10-CM | POA: Diagnosis not present

## 2021-02-04 DIAGNOSIS — M19012 Primary osteoarthritis, left shoulder: Secondary | ICD-10-CM | POA: Diagnosis not present

## 2021-02-04 DIAGNOSIS — J449 Chronic obstructive pulmonary disease, unspecified: Secondary | ICD-10-CM | POA: Diagnosis not present

## 2021-02-05 ENCOUNTER — Telehealth: Payer: Self-pay

## 2021-02-05 DIAGNOSIS — I10 Essential (primary) hypertension: Secondary | ICD-10-CM | POA: Diagnosis not present

## 2021-02-05 DIAGNOSIS — I739 Peripheral vascular disease, unspecified: Secondary | ICD-10-CM | POA: Diagnosis not present

## 2021-02-05 DIAGNOSIS — E785 Hyperlipidemia, unspecified: Secondary | ICD-10-CM | POA: Diagnosis not present

## 2021-02-05 DIAGNOSIS — I471 Supraventricular tachycardia: Secondary | ICD-10-CM | POA: Diagnosis not present

## 2021-02-05 DIAGNOSIS — J449 Chronic obstructive pulmonary disease, unspecified: Secondary | ICD-10-CM

## 2021-02-05 MED ORDER — MOMETASONE FURO-FORMOTEROL FUM 100-5 MCG/ACT IN AERO: 2.0000 | INHALATION_SPRAY | Freq: Two times a day (BID) | RESPIRATORY_TRACT | 3 refills | Status: DC | PRN

## 2021-02-05 NOTE — Telephone Encounter (Signed)
PA approved for DULERA 100-5 MCG on 02/05/21 and is valid from 09/27/20 to /09/26/21,  Sent new rx to pharmacy

## 2021-02-06 DIAGNOSIS — I110 Hypertensive heart disease with heart failure: Secondary | ICD-10-CM | POA: Diagnosis not present

## 2021-02-06 DIAGNOSIS — E11621 Type 2 diabetes mellitus with foot ulcer: Secondary | ICD-10-CM | POA: Diagnosis not present

## 2021-02-06 DIAGNOSIS — I509 Heart failure, unspecified: Secondary | ICD-10-CM | POA: Diagnosis not present

## 2021-02-06 DIAGNOSIS — L97421 Non-pressure chronic ulcer of left heel and midfoot limited to breakdown of skin: Secondary | ICD-10-CM | POA: Diagnosis not present

## 2021-02-06 DIAGNOSIS — M19011 Primary osteoarthritis, right shoulder: Secondary | ICD-10-CM | POA: Diagnosis not present

## 2021-02-06 DIAGNOSIS — M19012 Primary osteoarthritis, left shoulder: Secondary | ICD-10-CM | POA: Diagnosis not present

## 2021-02-06 DIAGNOSIS — E1142 Type 2 diabetes mellitus with diabetic polyneuropathy: Secondary | ICD-10-CM | POA: Diagnosis not present

## 2021-02-06 DIAGNOSIS — J449 Chronic obstructive pulmonary disease, unspecified: Secondary | ICD-10-CM | POA: Diagnosis not present

## 2021-02-06 DIAGNOSIS — E1151 Type 2 diabetes mellitus with diabetic peripheral angiopathy without gangrene: Secondary | ICD-10-CM | POA: Diagnosis not present

## 2021-02-09 DIAGNOSIS — E1142 Type 2 diabetes mellitus with diabetic polyneuropathy: Secondary | ICD-10-CM | POA: Diagnosis not present

## 2021-02-09 DIAGNOSIS — I509 Heart failure, unspecified: Secondary | ICD-10-CM | POA: Diagnosis not present

## 2021-02-09 DIAGNOSIS — M19012 Primary osteoarthritis, left shoulder: Secondary | ICD-10-CM | POA: Diagnosis not present

## 2021-02-09 DIAGNOSIS — E1151 Type 2 diabetes mellitus with diabetic peripheral angiopathy without gangrene: Secondary | ICD-10-CM | POA: Diagnosis not present

## 2021-02-09 DIAGNOSIS — J449 Chronic obstructive pulmonary disease, unspecified: Secondary | ICD-10-CM | POA: Diagnosis not present

## 2021-02-09 DIAGNOSIS — I110 Hypertensive heart disease with heart failure: Secondary | ICD-10-CM | POA: Diagnosis not present

## 2021-02-09 DIAGNOSIS — L97421 Non-pressure chronic ulcer of left heel and midfoot limited to breakdown of skin: Secondary | ICD-10-CM | POA: Diagnosis not present

## 2021-02-09 DIAGNOSIS — E11621 Type 2 diabetes mellitus with foot ulcer: Secondary | ICD-10-CM | POA: Diagnosis not present

## 2021-02-09 DIAGNOSIS — M19011 Primary osteoarthritis, right shoulder: Secondary | ICD-10-CM | POA: Diagnosis not present

## 2021-02-12 ENCOUNTER — Telehealth: Payer: Self-pay | Admitting: Pharmacist

## 2021-02-12 NOTE — Progress Notes (Addendum)
    Chronic Care Management Pharmacy Assistant   Name: [name]  MRN: [id number] DOB: 11-Mar-1951  [name] is an 70 y.o. year old male who presents for his initial CCM visit with the clinical pharmacist.  Reason for Encounter: Chart Prep   Conditions to be addressed/monitored: HTN,GERD, DM II, HLD.  Primary concerns for visit include: HTN amd DM.   Recent office visits:  01/28/21 Luiz Ochoa, NP. For follow-up. No medication changes.   Recent consult visits:  02/05/21 Cardiology Paraschos, Sheppard Coil, MD. For follow-up. CHANGED/DECREASED Metoprolol to 12.5 mg daily.  01/12/21 Podiatry Cherrie Gauze, DPM. For post-op visit. No medication changes.   Hospital visits:  None since 01/02/21  Medications: Outpatient Encounter Medications as of 02/12/2021  Medication Sig   aspirin EC 81 MG tablet Take 81 mg by mouth daily.   Calcium Carbonate-Vitamin D (CALCIUM PLUS VITAMIN D PO) Take 1 tablet by mouth daily.   clopidogrel (PLAVIX) 75 MG tablet Take 1 tablet (75 mg total) by mouth daily.   cyclobenzaprine (FLEXERIL) 10 MG tablet Take 1 tablet (10 mg total) by mouth 3 (three) times daily as needed for muscle spasms.   fluconazole (DIFLUCAN) 150 MG tablet TAKE 1 TABLET BY MOUTH NOW REPEAT IN 3 DAYS IF NO BETTER   gabapentin (NEURONTIN) 300 MG capsule Take 1 capsule (300 mg total) by mouth 3 (three) times daily.   glucose blood (ACCU-CHEK GUIDE) test strip Use as instructed   glyBURIDE (DIABETA) 5 MG tablet Take 1 tablet (5 mg total) by mouth 2 (two) times daily with a meal.   Lancets (ACCU-CHEK SOFT TOUCH) lancets Use as instructed   lovastatin (MEVACOR) 10 MG tablet Take 1 tab  Po QHS   meloxicam (MOBIC) 15 MG tablet Take by mouth.   mometasone-formoterol (DULERA) 100-5 MCG/ACT AERO Inhale 2 puffs into the lungs 2 (two) times daily as needed for wheezing.   nystatin (MYCOSTATIN) 100000 UNIT/ML suspension Take 5 mLs (500,000 Units total) by mouth 4 (four) times daily.    ondansetron (ZOFRAN-ODT) 4 MG disintegrating tablet Take 1 tablet (4 mg total) by mouth every 8 (eight) hours as needed for nausea or vomiting.   oxybutynin (DITROPAN) 5 MG tablet Take 1 tablet (5 mg total) by mouth 2 (two) times daily.   traMADol (ULTRAM) 50 MG tablet TAKE 1 TABLET BY MOUTH EVERY 12 HOURS ASNEEDED FOR PAIN   No facility-administered encounter medications on file as of 02/12/2021.    Patients inital questions have been completed since April 2022. This is a update on the patients office visits for the clinical pharmacist before his intial appointment.    Star Rating Drugs: Gabapentin 300 mg 90 DS 12/01/20, Lovastatin 10 mg 90 DS 12/05/20  Follow-Up:Pharmacist Review  Charlann Lange, [place] Clinical Pharmacist Assistant [phone]

## 2021-02-13 NOTE — Progress Notes (Signed)
Chronic Care Management Pharmacy Note  02/17/2021 Name:  [name] MRN:  [id number] DOB:  08/01/1951  Subjective: [name] is an 70 y.o. year old male who is a primary patient of [name], [name], MD.  The CCM team was consulted for assistance with disease management and care coordination needs.    Engaged with patient face to face for initial visit in response to provider referral for pharmacy case management and/or care coordination services.   Consent to Services:  The patient was given the following information about Chronic Care Management services today, agreed to services, and gave verbal consent: 1. CCM service includes personalized support from designated clinical staff supervised by the primary care provider, including individualized plan of care and coordination with other care providers 2. 24/7 contact phone numbers for assistance for urgent and routine care needs. 3. Service will only be billed when office clinical staff spend 20 minutes or more in a month to coordinate care. 4. Only one practitioner may furnish and bill the service in a calendar month. 5.The patient may stop CCM services at any time (effective at the end of the month) by phone call to the office staff. 6. The patient will be responsible for cost sharing (co-pay) of up to 20% of the service fee (after annual deductible is met). Patient agreed to services and consent obtained.  Patient Care Team: [name], MD as PCP - General (Internal Medicine) [name], RN as [place], [place], [facility] as Pharmacist (Pharmacist)   Recent office visits:  01/28/21 Luiz Ochoa, NP. For follow-up. No medication changes.   12/30/20 Kenton Kingfisher) - daughter worried about swallowing due to recent choking.  Started on Nystatin for oral thrush.    Recent consult visits:  02/05/21 Cardiology Paraschos, Sheppard Coil, MD. For follow-up. CHANGED/DECREASED Metoprolol to 12.5 mg daily.   01/12/21 Podiatry Cherrie Gauze, DPM. For post-op visit. No medication changes.   Hospital visits:  None since 01/02/21  Objective:  Lab Results  Component Value Date   CREATININE 0.92 11/23/2020   BUN 13 11/19/2020   GFRNONAA >60 11/23/2020   GFRAA >60 06/20/2020   NA 133 (L) 11/19/2020   K 4.5 11/19/2020   CALCIUM 8.7 (L) 11/19/2020   CO2 27 11/19/2020   GLUCOSE 213 (H) 11/19/2020    Lab Results  Component Value Date/Time   HGBA1C 7.1 (A) 01/28/2021 10:15 AM   HGBA1C 8.9 (H) 11/13/2020 06:36 AM   HGBA1C 7.4 (A) 09/22/2020 08:44 AM   HGBA1C 6.4 (H) 07/10/2015 02:44 PM   MICROALBUR <3.0 (H) 01/04/2017 08:03 AM    Last diabetic Eye exam: No results found for: HMDIABEYEEXA  Last diabetic Foot exam: No results found for: HMDIABFOOTEX   Lab Results  Component Value Date   CHOL 110 06/20/2020   HDL 28 (L) 06/20/2020   LDLCALC 50 06/20/2020   TRIG 159 (H) 06/20/2020   CHOLHDL 3.9 06/20/2020    Hepatic Function Latest Ref Rng & Units 11/19/2020 11/18/2020 11/11/2020  Total Protein 6.5 - 8.1 g/dL 7.0 6.4(L) 6.0(L)  Albumin 3.5 - 5.0 g/dL 3.0(L) 2.7(L) 2.7(L)  AST 15 - 41 U/L $Remo'19 21 23  'sWgQs$ ALT 0 - 44 U/L $Remo'18 18 22  'cBHsk$ Alk Phosphatase 38 - 126 U/L 51 42 56  Total Bilirubin 0.3 - 1.2 mg/dL 1.2 1.0 1.2    Lab Results  Component Value Date/Time   TSH 3.259 11/12/2020 09:00 AM   TSH 1.838 11/11/2020 06:45 PM  FREET4 0.76 06/20/2020 10:01 AM   FREET4 1.07 10/18/2018 09:59 AM    CBC Latest Ref Rng & Units 11/23/2020 11/19/2020 11/18/2020  WBC 4.0 - 10.5 K/uL 4.9 4.9 5.4  Hemoglobin 13.0 - 17.0 g/dL 10.3(L) 11.2(L) 10.1(L)  Hematocrit 39.0 - 52.0 % 32.0(L) 34.2(L) 31.0(L)  Platelets 150 - 400 K/uL 146(L) 176 181    No results found for: VD25OH  Clinical ASCVD: No  The ASCVD Risk score Mikey Bussing DC Jr., et al., 2013) failed to calculate for the following reasons:   The valid total cholesterol range is 130 to 320 mg/dL    Depression screen [facility] 2/9 01/28/2021 12/30/2020 11/25/2020   Decreased Interest 0 0 0  Down, Depressed, Hopeless 0 0 0  PHQ - 2 Score 0 0 0      Social History   Tobacco Use  Smoking Status Former Smoker  . Quit date: 02/24/1997  . Years since quitting: 23.9  Smokeless Tobacco Never Used   BP Readings from Last 3 Encounters:  01/28/21 119/62  12/30/20 112/68  12/16/20 (!) 85/46   Pulse Readings from Last 3 Encounters:  01/28/21 77  12/30/20 80  12/16/20 62   Wt Readings from Last 3 Encounters:  01/28/21 155 lb (70.3 kg)  12/30/20 152 lb 3.2 oz (69 kg)  12/02/20 180 lb (81.6 kg)   BMI Readings from Last 3 Encounters:  01/28/21 22.24 kg/m  12/30/20 21.84 kg/m  12/02/20 25.83 kg/m    Assessment/Interventions: Review of patient past medical history, allergies, medications, health status, including review of consultants reports, laboratory and other test data, was performed as part of comprehensive evaluation and provision of chronic care management services.   SDOH:  (Social Determinants of Health) assessments and interventions performed: Yes   Financial Resource Strain: Low Risk   . Difficulty of Paying Living Expenses: Not very hard    SDOH Screenings   Alcohol Screen: Low Risk   . Last Alcohol Screening Score (AUDIT): 0  Depression (PHQ2-9): Low Risk   . PHQ-2 Score: 0  Financial Resource Strain: Low Risk   . Difficulty of Paying Living Expenses: Not very hard  Food Insecurity: No Food Insecurity  . Worried About Charity fundraiser in the Last Year: Never true  . Ran Out of Food in the Last Year: Never true  Housing: Low Risk   . Last Housing Risk Score: 0  Physical Activity: Not on file  Social Connections: Not on file  Stress: Not on file  Tobacco Use: Medium Risk  . Smoking Tobacco Use: Former Smoker  . Smokeless Tobacco Use: Never Used  Transportation Needs: No Transportation Needs  . Lack of Transportation (Medical): No  . Lack of Transportation (Non-Medical): No    CCM Care Plan  Allergies   Allergen Reactions  . Penicillins Rash and Other (See Comments)    Rash in and around the mouth Has patient had a PCN reaction causing immediate rash, facial/tongue/throat swelling, SOB or lightheadedness with hypotension: Yes Has patient had a PCN reaction causing severe rash involving mucus membranes or skin necrosis: Yes Has patient had a PCN reaction that required hospitalization: No Has patient had a PCN reaction occurring within the last 10 years: Yes If all of the above answers are "NO", then may proceed with Cephalosporin use.   . Bactrim [Sulfamethoxazole-Trimethoprim] Other (See Comments)    Mouth sores, Sores develop in mouth    Medications Reviewed Today    Reviewed by Luiz Ochoa, NP (Nurse Practitioner) on  01/29/21 at 2024  Med List Status: <None>  Medication Order Taking? Sig Documenting Provider Last Dose Status Informant  aspirin EC 81 MG tablet [id number] No Take 81 mg by mouth daily. [provider] Taking Active Self  Calcium Carbonate-Vitamin D (CALCIUM PLUS VITAMIN D PO) [id number] No Take 1 tablet by mouth daily. [provider] Taking Active Self  clopidogrel (PLAVIX) 75 MG tablet [id number] No Take 1 tablet (75 mg total) by mouth daily. Kris Hartmann, NP Taking Active   cyclobenzaprine (FLEXERIL) 10 MG tablet [id number]  Take 1 tablet (10 mg total) by mouth 3 (three) times daily as needed for muscle spasms. Luiz Ochoa, NP  Active   fluconazole (DIFLUCAN) 150 MG tablet [id number]  TAKE 1 TABLET BY MOUTH NOW REPEAT IN 3 DAYS IF NO BETTER Luiz Ochoa, NP  Active   gabapentin (NEURONTIN) 300 MG capsule [id number] No Take 1 capsule (300 mg total) by mouth 3 (three) times daily. [name], MD Taking Active   glucose blood (ACCU-CHEK GUIDE) test strip [id number] No Use as instructed Ronnell Freshwater, NP Taking Active Self  glyBURIDE (DIABETA) 5 MG tablet [id number] No Take 1 tablet (5 mg total) by mouth 2 (two) times daily with a meal. [name], MD Taking Active   Lancets [facility] SOFT [facility]) lancets [id number] No Use as instructed Ronnell Freshwater, NP Taking Active Self  lovastatin (MEVACOR) 10 MG tablet [id number] No Take 1 tab  Po QHS Boscia, Heather E, NP Taking Active Self  meloxicam (MOBIC) 15 MG tablet [id number] No Take by mouth. [provider] Taking Active Self  mometasone-formoterol (DULERA) 100-5 MCG/ACT Hollie Salk [id number]  Inhale 2 puffs into the lungs 2 (two) times daily as needed for wheezing. Luiz Ochoa, NP  Active   nystatin (MYCOSTATIN) 100000 UNIT/ML suspension [id number]  Take 5 mLs (500,000 Units total) by mouth 4 (four) times daily. Luiz Ochoa, NP  Active   ondansetron (ZOFRAN-ODT) 4 MG disintegrating tablet [id number] No Take 1 tablet (4 mg total) by mouth every 8 (eight) hours as needed for nausea or vomiting. [name], MD Taking Active   oxybutynin (DITROPAN) 5 MG tablet [id number] No Take 1 tablet (5 mg total) by mouth 2 (two) times daily. [name], MD Taking Active   traMADol Veatrice Bourbon) 50 MG tablet [id number]  TAKE 1 TABLET BY MOUTH EVERY 12 HOURS ASNEEDED FOR PAIN Luiz Ochoa, NP  Active           Patient Active Problem List   Diagnosis Date Noted  . Bacteremia due to Enterococcus   . Bacteremia due to methicillin susceptible Staphylococcus aureus (MSSA)   . Diabetic foot infection ([place])   . Sepsis ([place]) 11/11/2020  . Diabetic foot ulcer ([place]) 11/11/2020  . Moderate asthma without complication 03/70/4888  . Bilateral impacted cerumen 08/09/2020  . Acute otitis externa of both ears 08/09/2020  . Primary generalized (osteo)arthritis 03/20/2020  . Dysuria 06/15/2019  . Atherosclerosis of artery of extremity with ulceration ([place]) 11/30/2018  . Need for vaccination against Streptococcus pneumoniae using pneumococcal conjugate vaccine 13 10/26/2018  . Overactive bladder 10/26/2018  . Atherosclerosis of native arteries of the extremities with gangrene ([place]) 10/25/2018  .  Sore throat 04/03/2018  . Oral candidiasis 04/03/2018  . Uncontrolled type 2 diabetes mellitus with hyperglycemia ([place]) 03/06/2018  . Acute non-recurrent pansinusitis 03/06/2018  . Primary osteoarthritis of shoulders, bilateral 03/06/2018  . GERD (gastroesophageal reflux disease) 11/03/2017  . Carotid stenosis, right  05/12/2017  . Encounter for general adult medical examination with abnormal findings 03/25/2017  . PAD (peripheral artery disease) ([place]) 03/15/2017  . Bilateral carotid artery stenosis 03/15/2017  . Cardiomyopathy, nonischemic ([place]) 07/28/2015  . Chest pain with low risk for cardiac etiology 12/04/2014  . Paroxysmal SVT (supraventricular tachycardia) ([place]) 06/13/2014  . Diabetes ([place]) 06/13/2014  . Mixed hyperlipidemia 06/13/2014  . Essential hypertension 06/13/2014  . Arthritis 06/13/2014  . H/O seasonal allergies 06/13/2014    Immunization History  Administered Date(s) Administered  . Influenza, High Dose Seasonal PF 06/06/2019  . Influenza-Unspecified 07/01/2020  . Moderna Sars-Covid-2 Vaccination 01/22/2020, 02/19/2020, 07/01/2020  . Pneumococcal Polysaccharide-23 06/19/2020    Conditions to be addressed/monitored:  HTN, PAD, SVT, GERD, Type II DM, Lipids, Arthritis  Care Plan : General Pharmacy (Adult)  Updates made by Edythe Clarity, RPH since 02/17/2021 12:00 AM    Problem: HTN, PAD, SVT, GERD, Type II DM, Lipids, Arthritis   Priority: High  Onset Date: 02/17/2021    Long-Range Goal: Patient-Specific Goal   Start Date: 02/17/2021  Expected End Date: 08/20/2021  This Visit's Progress: On track  Priority: High  Note:   Current Barriers:  . Unable to independently afford treatment regimen . Unable to independently monitor therapeutic efficacy  Pharmacist Clinical Goal(s):  Marland Kitchen Patient will verbalize ability to afford treatment regimen . maintain control of BP as evidenced by home monitoring  . adhere to plan to optimize therapeutic regimen for asthma  as evidenced by report of adherence to recommended medication management changes . contact provider office for questions/concerns as evidenced notation of same in electronic health record through collaboration with PharmD and provider.   Interventions: . 1:1 collaboration with [name], MD regarding development and update of comprehensive plan of care as evidenced by provider attestation and co-signature . Inter-disciplinary care team collaboration (see longitudinal plan of care) . Comprehensive medication review performed; medication list updated in electronic medical record  Hypertension/SVT (BP goal <130/80) -Controlled -Current treatment: . Toprol XL $RemoveB'50mg'MWwOMZdh$  daily -Medications previously tried: Lisinopril (low BP)  -Current home readings: not checking -Current dietary habits: nothing specific, trying to maintain weight, protein shakes for snacks -Current exercise habits: minimal, goes fishing etc. -Denies hypotensive/hypertensive symptoms -Educated on BP goals and benefits of medications for prevention of heart attack, stroke and kidney damage; Daily salt intake goal < 2300 mg; Exercise goal of 150 minutes per week; Importance of home blood pressure monitoring; Symptoms of hypotension and importance of maintaining adequate hydration; -Counseled to monitor BP at home at least once weekly or if symptomatic, document, and provide log at future appointments -Recently had low Bps and lisinopril was d/c, dose of Toprol XL was cut in half to $Remov'50mg'CyhyCQ$  daily.  Has not had any dizziness since. -Recommended to continue current medication Recommended start to occasionally monitor BP at home.  Hyperlipidemia/PAD: (LDL goal < 70) -Controlled -Current treatment: . Lovastatin $RemoveBefo'10mg'eEBZGbqZaxo$  . Clopidogrel $RemoveBefor'75mg'qpwSwyPXPxDb$  daily -Medications previously tried: none noted   -Educated on Cholesterol goals;  Benefits of statin for ASCVD risk reduction; Importance of limiting foods high in cholesterol; Exercise goal of  150 minutes per week;  Most recent LDL is excellent -Recommended to continue current medication  Diabetes (A1c goal <7%) -Uncontrolled -Current medications: . Glyburide $RemoveBef'5mg'YrQaxHNfEG$  BID with meals -Medications previously tried: [place]  -Current home glucose readings . fasting glucose: not checking . post prandial glucose: not checking -Denies hypoglycemic/hyperglycemic symptoms -Current meal patterns:  . breakfast: oatmeal, protein shakes  . lunch: inconsistent . dinner:  .  snacks: nabs, protein shakes . drinks: water mainly -Current exercise: minimal, fishing -Educated on A1c and blood sugar goals; Prevention and management of hypoglycemic episodes; Benefits of routine self-monitoring of blood sugar; Importance of eating with doses of glyburide and the potential for hypoglycemia if he does not eat -Counseled to check feet daily and get yearly eye exams -Recommended to continue current medication Recommended to monitor at least occasionally fasting and if he ever feels like it is low.  Encouraged to eat with ALL doses of glyburide.  Arthritis (Goal: Control symmptoms) -Controlled -Current treatment  . Meloxicam $RemoveBef'15mg'EOgACVEySI$  daily . Tramadol $RemoveBe'50mg'ukArqVbma$  - hs -Medications previously tried: none noted -Counseled on avoidance of other NSAIDs and to take Tylenol if he needed to for breakthrough headache or other minor aches/pains  -Recommended to continue current medication  Asthma (Goal: Control symptoms) -Controlled -Current treatment  . Dulera 100-89mcg -Medications previously tried: none noted  -Reports copay is high and he does not have this at home -Does mention another inhaler and not sure which he is taking.  He is to call me when he gets home to let me know which inhaler he is using.  Will look into PAP to see if there is some help with [facility] copay. -Recommended to continue current medication   Patient Goals/Self-Care Activities . Patient will:  - take medications as prescribed focus  on medication adherence by pill box check glucose when symptomatic, document, and provide at future appointments check blood pressure weekly, document, and provide at future appointments collaborate with provider on medication access solutions  Follow Up Plan: The care management team will reach out to the patient again over the next 120 days.         Medication Assistance: Application for [facility]  medication assistance program. in process.  Anticipated assistance start date unknown.  See plan of care for additional detail.  Patient's preferred pharmacy is:  [facility], [place] [address] Phone: [phone] Fax: [phone]  Uses pill box? Yes Pt endorses 100% compliance  We discussed: Benefits of medication synchronization, packaging and delivery as well as enhanced pharmacist oversight with [employer]. Patient decided to: Continue current medication management strategy  Care Plan and Follow Up Patient Decision:  Patient agrees to Care Plan and Follow-up.  Plan: The care management team will reach out to the patient again over the next 120 days.  Beverly Milch, PharmD Clinical Pharmacist [place] [phone]

## 2021-02-16 DIAGNOSIS — L97421 Non-pressure chronic ulcer of left heel and midfoot limited to breakdown of skin: Secondary | ICD-10-CM | POA: Diagnosis not present

## 2021-02-16 DIAGNOSIS — I110 Hypertensive heart disease with heart failure: Secondary | ICD-10-CM | POA: Diagnosis not present

## 2021-02-16 DIAGNOSIS — I509 Heart failure, unspecified: Secondary | ICD-10-CM | POA: Diagnosis not present

## 2021-02-16 DIAGNOSIS — J449 Chronic obstructive pulmonary disease, unspecified: Secondary | ICD-10-CM | POA: Diagnosis not present

## 2021-02-16 DIAGNOSIS — E11621 Type 2 diabetes mellitus with foot ulcer: Secondary | ICD-10-CM | POA: Diagnosis not present

## 2021-02-16 DIAGNOSIS — E1142 Type 2 diabetes mellitus with diabetic polyneuropathy: Secondary | ICD-10-CM | POA: Diagnosis not present

## 2021-02-16 DIAGNOSIS — E1151 Type 2 diabetes mellitus with diabetic peripheral angiopathy without gangrene: Secondary | ICD-10-CM | POA: Diagnosis not present

## 2021-02-16 DIAGNOSIS — M19011 Primary osteoarthritis, right shoulder: Secondary | ICD-10-CM | POA: Diagnosis not present

## 2021-02-16 DIAGNOSIS — M19012 Primary osteoarthritis, left shoulder: Secondary | ICD-10-CM | POA: Diagnosis not present

## 2021-02-17 ENCOUNTER — Ambulatory Visit: Payer: Medicare HMO | Admitting: Pharmacist

## 2021-02-17 ENCOUNTER — Other Ambulatory Visit: Payer: Self-pay

## 2021-02-17 DIAGNOSIS — E1165 Type 2 diabetes mellitus with hyperglycemia: Secondary | ICD-10-CM

## 2021-02-17 DIAGNOSIS — E782 Mixed hyperlipidemia: Secondary | ICD-10-CM

## 2021-02-17 DIAGNOSIS — I471 Supraventricular tachycardia: Secondary | ICD-10-CM

## 2021-02-17 DIAGNOSIS — J45909 Unspecified asthma, uncomplicated: Secondary | ICD-10-CM

## 2021-02-17 DIAGNOSIS — M150 Primary generalized (osteo)arthritis: Secondary | ICD-10-CM

## 2021-02-17 DIAGNOSIS — I739 Peripheral vascular disease, unspecified: Secondary | ICD-10-CM

## 2021-02-17 DIAGNOSIS — I10 Essential (primary) hypertension: Secondary | ICD-10-CM

## 2021-02-17 NOTE — Patient Instructions (Addendum)
Visit Information  Goals Addressed            This Visit's Progress   . Monitor and Manage My Blood Sugar-Diabetes Type 2       Timeframe:  Long-Range Goal Priority:  High Start Date:    11/25/20                         Expected End Date:   05/27/21                    Follow Up Date 02/24/21 - check blood sugar at prescribed times - take the blood sugar log to all doctor visits    Why is this important?    Checking your blood sugar at home helps to keep it from getting very high or very low.   Writing the results in a diary or log helps the doctor know how to care for you.   Your blood sugar log should have the time, date and the results.   Also, write down the amount of insulin or other medicine that you take.   Other information, like what you ate, exercise done and how you were feeling, will also be helpful.     Notes: Patient currently not checking encouraged to check. 12/03/20 Patient not checking sugars currently. Encouraged patient to check sugars. 12/09/20 Patient checked blood sugar it was 223.   12/22/20  PLEASE CHECK YOUR BLOOD SUGARS. 01/15/21 Please check your sugars.   02/17/21 Not checking sugars at home.  "Hard to bleed"             Long-Range Goal: Patient-Specific Goal   Start Date: 02/17/2021  Expected End Date: 08/20/2021  This Visit's Progress: On track  Priority: High  Note:   Current Barriers:  . Unable to independently afford treatment regimen . Unable to independently monitor therapeutic efficacy  Pharmacist Clinical Goal(s):  Marland Kitchen Patient will verbalize ability to afford treatment regimen . maintain control of BP as evidenced by home monitoring  . adhere to plan to optimize therapeutic regimen for asthma as evidenced by report of adherence to recommended medication management changes . contact provider office for questions/concerns as evidenced notation of same in electronic health record through collaboration with PharmD and provider.    Interventions: . 1:1 collaboration with [name], MD regarding development and update of comprehensive plan of care as evidenced by provider attestation and co-signature . Inter-disciplinary care team collaboration (see longitudinal plan of care) . Comprehensive medication review performed; medication list updated in electronic medical record  Hypertension/SVT (BP goal <130/80) -Controlled -Current treatment: . Toprol XL 50mg  daily -Medications previously tried: Lisinopril (low BP)  -Current home readings: not checking -Current dietary habits: nothing specific, trying to maintain weight, protein shakes for snacks -Current exercise habits: minimal, goes fishing etc. -Denies hypotensive/hypertensive symptoms -Educated on BP goals and benefits of medications for prevention of heart attack, stroke and kidney damage; Daily salt intake goal < 2300 mg; Exercise goal of 150 minutes per week; Importance of home blood pressure monitoring; Symptoms of hypotension and importance of maintaining adequate hydration; -Counseled to monitor BP at home at least once weekly or if symptomatic, document, and provide log at future appointments -Recently had low Bps and lisinopril was d/c, dose of Toprol XL was cut in half to 50mg  daily.  Has not had any dizziness since. -Recommended to continue current medication Recommended start to occasionally monitor BP at home.  Hyperlipidemia/PAD: (LDL goal <  70) -Controlled -Current treatment: . Lovastatin 10mg  . Clopidogrel 75mg  daily -Medications previously tried: none noted   -Educated on Cholesterol goals;  Benefits of statin for ASCVD risk reduction; Importance of limiting foods high in cholesterol; Exercise goal of 150 minutes per week;  Most recent LDL is excellent -Recommended to continue current medication  Diabetes (A1c goal <7%) -Uncontrolled -Current medications: . Glyburide 5mg  BID with meals -Medications previously tried: [place]   -Current home glucose readings . fasting glucose: not checking . post prandial glucose: not checking -Denies hypoglycemic/hyperglycemic symptoms -Current meal patterns:  . breakfast: oatmeal, protein shakes  . lunch: inconsistent . dinner:  . snacks: nabs, protein shakes . drinks: water mainly -Current exercise: minimal, fishing -Educated on A1c and blood sugar goals; Prevention and management of hypoglycemic episodes; Benefits of routine self-monitoring of blood sugar; Importance of eating with doses of glyburide and the potential for hypoglycemia if he does not eat -Counseled to check feet daily and get yearly eye exams -Recommended to continue current medication Recommended to monitor at least occasionally fasting and if he ever feels like it is low.  Encouraged to eat with ALL doses of glyburide.  Arthritis (Goal: Control symmptoms) -Controlled -Current treatment  . Meloxicam 15mg  daily . Tramadol 50mg  - hs -Medications previously tried: none noted -Counseled on avoidance of other NSAIDs and to take Tylenol if he needed to for breakthrough headache or other minor aches/pains  -Recommended to continue current medication  Asthma (Goal: Control symptoms) -Controlled -Current treatment  . Dulera 100-102mcg -Medications previously tried: none noted  -Reports copay is high and he does not have this at home -Does mention another inhaler and not sure which he is taking.  He is to call me when he gets home to let me know which inhaler he is using.  Will look into PAP to see if there is some help with [facility] copay. -Recommended to continue current medication   Patient Goals/Self-Care Activities . Patient will:  - take medications as prescribed focus on medication adherence by pill box check glucose when symptomatic, document, and provide at future appointments check blood pressure weekly, document, and provide at future appointments collaborate with provider on medication access  solutions  Follow Up Plan: The care management team will reach out to the patient again over the next 120 days.        Mr. Headen was given information about Chronic Care Management services today including:  1. CCM service includes personalized support from designated clinical staff supervised by his physician, including individualized plan of care and coordination with other care providers 2. 24/7 contact phone numbers for assistance for urgent and routine care needs. 3. Standard insurance, coinsurance, copays and deductibles apply for chronic care management only during months in which we provide at least 20 minutes of these services. Most insurances cover these services at 100%, however patients may be responsible for any copay, coinsurance and/or deductible if applicable. This service may help you avoid the need for more expensive face-to-face services. 4. Only one practitioner may furnish and bill the service in a calendar month. 5. The patient may stop CCM services at any time (effective at the end of the month) by phone call to the office staff.  Patient agreed to services and verbal consent obtained.   The patient verbalized understanding of instructions, educational materials, and care plan provided today and agreed to receive a mailed copy of patient instructions, educational materials, and care plan.  Telephone follow up appointment with pharmacy team  member scheduled for: 4 months  Edythe Clarity, [facility]  Asthma Attack Prevention, Adult Although you may not be able to control the fact that you have asthma, you can take actions to prevent episodes of asthma (asthma attacks). How can this condition affect me? Asthma attacks (flare ups) can cause trouble breathing, wheezing, and coughing. They may keep you from doing activities you like to do. What can increase my risk? Coming into contact with things that cause asthma symptoms (asthma triggers) can put you at risk for an asthma  attack. Common asthma triggers include:  Things you are allergic to (allergens), such as: ? Dust mite and cockroach droppings. ? Pet dander. ? Mold. ? Pollen from trees and grasses. ? Food allergies. This might be a specific food or added chemicals called sulfites.  Irritants, such as: ? Weather changes including very cold, dry, or humid air. ? Smoke. This includes campfire smoke, air pollution, and tobacco smoke. ? Strong odors from aerosol sprays and fumes from perfume, candles, and household cleaners.  Other triggers, such as: ? Certain medicines. This includes NSAIDs, such as ibuprofen and aspirin. ? Viral respiratory infections (colds), including runny nose (rhinitis) or infection in the sinuses (sinusitis). ? Activity including exercise, laughing, or crying. ? Not using inhaled medicines (corticosteroids) as told. What actions can I take to prevent an asthma attack?  Stay healthy. Stay up to date on all immunizations as told by your health care provider, including the yearly flu (influenza) vaccine and pneumonia vaccine.  Many asthma attacks can be prevented by carefully following your written asthma action plan. Follow your asthma action plan Work with your health care provider to create a written asthma action plan. This plan should include:  A list of your asthma triggers and how to avoid or reduce them.  A list of symptoms that you may have during an asthma attack.  Information about which medicine to take, when to take the medicine, and how much of the medicine to take.  Information to help you understand your peak flow measurements.  Daily actions that you can take to prevent (control) your asthma symptoms.  Contact information for your health care providers.  If you have an asthma attack, act quickly. Follow the emergency steps on your written asthma action plan. This may prevent you from needing to go to the hospital. Monitor your asthma. To do this:  Use your  peak flow meter every morning and every evening for 2-3 weeks or as told by your health care provider. ? Record the results in a journal. ? A drop in your peak flow numbers on one or more days may mean that you are starting to have an asthma attack, even if you are not having symptoms.  When you have asthma symptoms, write them down in a journal.  Write down in your journal how often you need to use your fast-acting rescue inhaler. If you are using your rescue inhaler more often, it may mean that your asthma is not under control. Talk with your health care provider about adjusting your asthma treatment plan to help you prevent future asthma attacks and gain better control of your condition.   Lifestyle  Avoid or reduce contact with known outdoor allergens by staying indoors, keeping windows closed, and using air conditioning when pollen and mold counts are high.  Do not use any products that contain nicotine or tobacco, such as cigarettes, e-cigarettes, and chewing tobacco. If you need help quitting, ask your health care  provider.  If you are overweight, consider a weight-loss plan.  Find ways to cope with stress and your feelings, such as mindfulness, relaxation, or breathing exercises.  Ask your health care provider if a breathing exercise program (pulmonary rehabilitation) may be helpful to control symptoms and improve your quality of life. Medicines  Take over-the-counter and prescription medicines only as told by your health care provider.  Do not stop taking your medicine and do not take less medicine even if you are doing well.  Let your health care provider know: ? How often you use your rescue inhaler. ? How often you have symptoms when you are taking your regular medicines. ? If you wake up at night because of asthma symptoms. ? If you have more trouble with your breathing when you exercise.   Activity  Do your normal activities as told by your health care provider. Ask your  health care provider what activities are safe for you.  Some people have asthma symptoms or more asthma symptoms when they exercise. This is called exercise-induced bronchoconstriction (EIB). If you have this problem, talk with your health care provider about how to manage EIB. Some tips to follow include: ? Use your fast-acting inhaler before exercise. ? Exercise indoors if it is very cold, humid, or the pollen and mold counts are high. ? Warm up and cool down before and after exercise. ? Stop exercising right away if your asthma symptoms start or get worse. Where to find more information  Asthma and Allergy Foundation of America: www.aafa.org  Centers for Disease Control and Prevention: http://www.wolf.info/  American Lung Association: www.lung.org  National Heart, Lung, and Blood Institute: https://wilson-eaton.com/  World Health Organization: RoleLink.com.br Get help right away if:  You have followed your written asthma action plan and your symptoms are not improving. Summary  Asthma attacks (flare ups) can cause trouble breathing, wheezing, and coughing. They may keep you from doing activities you normally like to do.  Work with your health care provider to create a written asthma action plan.  Do not stop taking your medicine and do not take less medicine even if you are doing well.  Do not use any products that contain nicotine or tobacco, such as cigarettes, e-cigarettes, and chewing tobacco. If you need help quitting, ask your health care provider. This information is not intended to replace advice given to you by your health care provider. Make sure you discuss any questions you have with your health care provider. Document Revised: 09/11/2019 Document Reviewed: 09/11/2019 Elsevier Patient Education  2021 [employer].

## 2021-02-18 DIAGNOSIS — J449 Chronic obstructive pulmonary disease, unspecified: Secondary | ICD-10-CM | POA: Diagnosis not present

## 2021-02-18 DIAGNOSIS — L97421 Non-pressure chronic ulcer of left heel and midfoot limited to breakdown of skin: Secondary | ICD-10-CM | POA: Diagnosis not present

## 2021-02-18 DIAGNOSIS — E11621 Type 2 diabetes mellitus with foot ulcer: Secondary | ICD-10-CM | POA: Diagnosis not present

## 2021-02-18 DIAGNOSIS — E1142 Type 2 diabetes mellitus with diabetic polyneuropathy: Secondary | ICD-10-CM | POA: Diagnosis not present

## 2021-02-18 DIAGNOSIS — E1151 Type 2 diabetes mellitus with diabetic peripheral angiopathy without gangrene: Secondary | ICD-10-CM | POA: Diagnosis not present

## 2021-02-18 DIAGNOSIS — I509 Heart failure, unspecified: Secondary | ICD-10-CM | POA: Diagnosis not present

## 2021-02-18 DIAGNOSIS — M19012 Primary osteoarthritis, left shoulder: Secondary | ICD-10-CM | POA: Diagnosis not present

## 2021-02-18 DIAGNOSIS — M19011 Primary osteoarthritis, right shoulder: Secondary | ICD-10-CM | POA: Diagnosis not present

## 2021-02-18 DIAGNOSIS — I110 Hypertensive heart disease with heart failure: Secondary | ICD-10-CM | POA: Diagnosis not present

## 2021-02-19 ENCOUNTER — Other Ambulatory Visit: Payer: Self-pay

## 2021-02-19 NOTE — Patient Outreach (Addendum)
[facility]) Care Management  [place]  02/19/2021   [name] 1951-03-17 [id number]  Subjective: Telephone call to patient for follow up. He reports doing good. He states his foot is almost healed.  Wound vac discontinued and weekly dressing being applied by home health.  Discussed signs of infection and notifying physician for changes.  He verbalized understanding.  Patient A1c 7.1 which is improved.  Patient continues not to check sugars.  Encouraged patient to check his sugars. Discussed methods to ensure he gets a good drop. He verbalized understanding.     Objective:   Encounter Medications:  Outpatient Encounter Medications as of 02/19/2021  Medication Sig  . aspirin EC 81 MG tablet Take 81 mg by mouth daily.  . Calcium Carbonate-Vitamin D (CALCIUM PLUS VITAMIN D PO) Take 1 tablet by mouth daily.  . clopidogrel (PLAVIX) 75 MG tablet Take 1 tablet (75 mg total) by mouth daily.  . cyclobenzaprine (FLEXERIL) 10 MG tablet Take 1 tablet (10 mg total) by mouth 3 (three) times daily as needed for muscle spasms.  Marland Kitchen gabapentin (NEURONTIN) 300 MG capsule Take 1 capsule (300 mg total) by mouth 3 (three) times daily.  Marland Kitchen glucose blood (ACCU-CHEK GUIDE) test strip Use as instructed  . glyBURIDE (DIABETA) 5 MG tablet Take 1 tablet (5 mg total) by mouth 2 (two) times daily with a meal.  . Lancets (ACCU-CHEK SOFT TOUCH) lancets Use as instructed  . lovastatin (MEVACOR) 10 MG tablet Take 1 tab  Po QHS  . meloxicam (MOBIC) 15 MG tablet Take by mouth.  . metoprolol succinate (TOPROL-XL) 50 MG 24 hr tablet Take 50 mg by mouth daily. Take with or immediately following a meal.  . mometasone-formoterol (DULERA) 100-5 MCG/ACT AERO Inhale 2 puffs into the lungs 2 (two) times daily as needed for wheezing.  . ondansetron (ZOFRAN-ODT) 4 MG disintegrating tablet Take 1 tablet (4 mg total) by mouth every 8 (eight) hours as needed for nausea or vomiting.  . traMADol (ULTRAM) 50 MG  tablet TAKE 1 TABLET BY MOUTH EVERY 12 HOURS ASNEEDED FOR PAIN   No facility-administered encounter medications on file as of 02/19/2021.    Functional Status:  In your present state of health, do you have any difficulty performing the following activities: 11/25/2020 11/12/2020  Hearing? N N  Vision? N N  Difficulty concentrating or making decisions? N N  Walking or climbing stairs? Y Y  Comment weakness and foot wound -  Dressing or bathing? N N  Doing errands, shopping? N N  Preparing Food and eating ? N -  Using the Toilet? N -  In the past six months, have you accidently leaked urine? N -  Do you have problems with loss of bowel control? N -  Managing your Medications? N -  Managing your Finances? N -  Housekeeping or managing your Housekeeping? Y -  Comment daughter [name] helps -  Some recent data might be hidden    Fall/Depression Screening: Fall Risk  01/28/2021 12/30/2020 11/25/2020  Falls in the past year? 0 0 0  Number falls in past yr: - - -  Injury with Fall? - - -   PHQ 2/9 Scores 01/28/2021 12/30/2020 11/25/2020 09/22/2020 06/20/2020 03/20/2020 12/17/2019  PHQ - 2 Score 0 0 0 0 0 0 0    Assessment:  Goals Addressed            This Visit's Progress   . Careful Skin Care-wound to left heel  On track    Barriers: Knowledge Timeframe:  Short-Term Goal Priority:  High Start Date:   11/25/20                          Expected End Date:  05/27/21              Follow Up Date  03/26/21  - clean and dry skin well  -keep wound vac intact -monitor for signs such as redness, swelling or pain to site   Why is this important?     Taking really good care of your skin will help to keep your skin unbroken.    Notes: 11/25/20 Patient reports wound vac intact with no redness or swelling.  12/03/20 Patient to wear wound vac for 2 more weeks. 12/09/20 Wound making progress per patient 12/22/20 Wound making progress 01/15/21 Patient reports some wound closure.  Patient recently saw  podiatrist.   02/19/21 Wound vac discontinued.  Wound clean and dry.  Home health changing dressing weekly per patient.    . Make and Keep All Appointments   On track    Barriers: None Timeframe:  Long-Range Goal Priority:  High Start Date:    11/25/20                         Expected End Date:    05/27/21                   Follow Up Date 03/26/21   - call to cancel if needed    Why is this important?    Part of staying healthy is seeing the doctor for follow-up care.   If you forget your appointments, there are some things you can do to stay on track.    Notes: Patient daughter to make follow up appointments today 11/25/20.  12/03/20 patient seeing physicians as scheduled.  12/09/20 Patient has no problems with transportation.  01/15/21 Patient reports seeing physicians as scheduled. 02/19/21 No transportation problems.      . Monitor and Manage My Blood Sugar-Diabetes Type 2   Not on track    Barriers: Health Behaviors Knowledge Timeframe:  Long-Range Goal Priority:  High Start Date:    11/25/20                         Expected End Date:   05/27/21                    Follow Up Date 03/26/21  - check blood sugar at prescribed times    Why is this important?    Checking your blood sugar at home helps to keep it from getting very high or very low.   Writing the results in a diary or log helps the doctor know how to care for you.   Your blood sugar log should have the time, date and the results.   Also, write down the amount of insulin or other medicine that you take.   Other information, like what you ate, exercise done and how you were feeling, will also be helpful.     Notes: Patient currently not checking encouraged to check. 12/03/20 Patient not checking sugars currently. Encouraged patient to check sugars. 12/09/20 Patient checked blood sugar it was 223.   12/22/20  PLEASE CHECK YOUR BLOOD SUGARS. 01/15/21 Please check your sugars.   02/17/21 Not checking sugars at home.  "  Hard to  bleed"  02/19/21 Not checking sugars. He states he does not bleed.  Discussed methods to promote bleeding.        Plan: RN CM will follow up in June.   Follow-up:  Patient agrees to Care Plan and Follow-up.   [name], RN, MSN [facility] Care Management Coordinator Direct Line [phone] Cell [phone] Toll Free: [phone]  Fax: [phone]

## 2021-02-19 NOTE — Patient Instructions (Addendum)
Goals Addressed            This Visit's Progress   . Careful Skin Care-wound to left heel   On track    Timeframe:  Short-Term Goal Priority:  High Start Date:   11/25/20                          Expected End Date:  05/27/21              Follow Up Date  03/26/21  - clean and dry skin well  -keep wound vac intact -monitor for signs such as redness, swelling or pain to site   Why is this important?     Taking really good care of your skin will help to keep your skin unbroken.    Notes: 11/25/20 Patient reports wound vac intact with no redness or swelling.  12/03/20 Patient to wear wound vac for 2 more weeks. 12/09/20 Wound making progress per patient 12/22/20 Wound making progress 01/15/21 Patient reports some wound closure.  Patient recently saw podiatrist.   02/19/21 Wound vac discontinued.  Wound clean and dry.  Home health changing dressing weekly per patient.    . Make and Keep All Appointments   On track    Timeframe:  Long-Range Goal Priority:  High Start Date:    11/25/20                         Expected End Date:    05/27/21                   Follow Up Date 03/26/21   - call to cancel if needed    Why is this important?    Part of staying healthy is seeing the doctor for follow-up care.   If you forget your appointments, there are some things you can do to stay on track.    Notes: Patient daughter to make follow up appointments today 11/25/20.  12/03/20 patient seeing physicians as scheduled.  12/09/20 Patient has no problems with transportation.  01/15/21 Patient reports seeing physicians as scheduled. 02/19/21 No transportation problems.      . Monitor and Manage My Blood Sugar-Diabetes Type 2   Not on track    Timeframe:  Long-Range Goal Priority:  High Start Date:    11/25/20                         Expected End Date:   05/27/21                    Follow Up Date 03/26/21  - check blood sugar at prescribed times    Why is this important?    Checking your blood sugar at home  helps to keep it from getting very high or very low.   Writing the results in a diary or log helps the doctor know how to care for you.   Your blood sugar log should have the time, date and the results.   Also, write down the amount of insulin or other medicine that you take.   Other information, like what you ate, exercise done and how you were feeling, will also be helpful.     Notes: Patient currently not checking encouraged to check. 12/03/20 Patient not checking sugars currently. Encouraged patient to check sugars. 12/09/20 Patient checked blood sugar it was 223.   12/22/20  PLEASE CHECK YOUR BLOOD SUGARS. 01/15/21 Please check your sugars.   02/17/21 Not checking sugars at home.  "Hard to bleed"  02/19/21 Not checking sugars. He states he does not bleed.  Discussed methods to promote bleeding.       Blood Glucose Monitoring, Adult Monitoring your blood sugar (glucose) is an important part of managing your diabetes. Blood glucose monitoring involves checking your blood glucose as often as directed and keeping a log or record of your results over time. Checking your blood glucose regularly and keeping a blood glucose log can:  Help you and your health care provider adjust your diabetes management plan as needed, including your medicines or insulin.  Help you understand how food, exercise, illnesses, and medicines affect your blood glucose.  Let you know what your blood glucose is at any time. You can quickly find out if you have low blood glucose (hypoglycemia) or high blood glucose (hyperglycemia). Your health care provider will set individualized treatment goals for you. Your goals will be based on your age, other medical conditions you have, and how you respond to diabetes treatment. Generally, the goal of treatment is to maintain the following blood glucose levels:  Before meals (preprandial): 80-130 mg/dL (4.4-7.2 mmol/L).  After meals (postprandial): below 180 mg/dL (10  mmol/L).  A1C level: less than 7%. Supplies needed:  Blood glucose meter.  Test strips for your meter. Each meter has its own strips. You must use the strips that came with your meter.  A needle to prick your finger (lancet). Do not use a lancet more than one time.  A device that holds the lancet (lancing device).  A journal or log book to write down your results. How to check your blood glucose Checking your blood glucose 1. Wash your hands for at least 20 seconds with soap and water. 2. Prick the side of your finger (not the tip) with the lancet. Do not use the same finger consecutively. 3. Gently rub the finger until a small drop of blood appears. 4. Follow instructions that come with your meter for inserting the test strip, applying blood to the strip, and using your blood glucose meter. 5. Write down your result and any notes in your log.   Using alternative sites Some meters allow you to use areas of your body other than your finger (alternative sites) to test your blood. The most common alternative sites are the forearm, the thigh, and the palm of your hand. Alternative sites may not be as accurate as the fingers because blood flow is slower in those areas. This means that the result you get may be delayed, and it may be different from the result that you would get from your finger. Use the finger only, and do not use alternative sites, if:  You think you have hypoglycemia.  You sometimes do not know that your blood glucose is getting low (hypoglycemia unawareness). General tips and recommendations Blood glucose log  Every time you check your blood glucose, write down your result. Also write down any notes about things that may be affecting your blood glucose, such as your diet and exercise for the day. This information can help you and your health care provider: ? Look for patterns in your blood glucose over time. ? Adjust your diabetes management plan as needed.  Check if  your meter allows you to download your records to a computer or if there is an app for the meter. Most glucose meters store a record of  glucose readings in the meter.   If you have type 1 diabetes:  Check your blood glucose 4 or more times a day if you are on intensive insulin therapy with multiple daily injections (MDI) or if you are using an insulin pump. Check your blood glucose: ? Before every meal and snack. ? Before bedtime.  Also check your blood glucose: ? If you have symptoms of hypoglycemia. ? After treating low blood glucose. ? Before doing activities that create a risk for injury, like driving or using machinery. ? Before and after exercise. ? Two hours after a meal. ? Occasionally between 2:00 a.m. and 3:00 a.m., as directed.  You may need to check your blood glucose more often, 6-10 times per day, if: ? You have diabetes that is not well controlled. ? You are ill. ? You have a history of severe hypoglycemia. ? You have hypoglycemia unawareness. If you have type 2 diabetes:  Check your blood glucose 2 or more times a day if you take insulin or other diabetes medicines.  Check your blood glucose 4 or more times a day if you are on intensive insulin therapy. Occasionally, you may also need to check your glucose between 2:00 a.m. and 3:00 a.m., as directed.  Also check your blood glucose: ? Before and after exercise. ? Before doing activities that create a risk for injury, like driving or using machinery.  You may need to check your blood glucose more often if: ? Your medicine is being adjusted. ? Your diabetes is not well controlled. ? You are ill. General tips  Make sure you always have your supplies with you.  After you use a few boxes of test strips, adjust (calibrate) your blood glucose meter by following instructions that came with your meter.  If you have questions or need help, all blood glucose meters have a 24-hour hotline phone number available that you  can call. Also contact your health care provider with questions or concerns you may have. Where to find more information  The American Diabetes Association: www.diabetes.org  The Association of Diabetes Care & Education Specialists: www.diabeteseducator.org Contact a health care provider if:  Your blood glucose is at or above 240 mg/dL (13.3 mmol/L) for 2 days in a row.  You have been sick or have had a fever for 2 days or longer, and you are not getting better.  You have any of the following problems for more than 6 hours: ? You cannot eat or drink. ? You have nausea or vomiting. ? You have diarrhea. Get help right away if:  Your blood glucose is lower than 54 mg/dL (3 mmol/L).  You become confused, or you have trouble thinking clearly.  You have difficulty breathing.  You have moderate or large ketone levels in your urine. These symptoms may represent a serious problem that is an emergency. Do not wait to see if the symptoms will go away. Get medical help right away. Call your local emergency services (911 in the [place] Do not drive yourself to the hospital. Summary  Monitoring your blood glucose is an important part of managing your diabetes.  Blood glucose monitoring involves checking your blood glucose as often as directed and keeping a log or record of your results over time.  Your health care provider will set individualized treatment goals for you. Your goals will be based on your age, other medical conditions you have, and how you respond to diabetes treatment.  Every time you check your blood glucose,  write down your result. Also, write down any notes about things that may be affecting your blood glucose, such as your diet and exercise for the day. This information is not intended to replace advice given to you by your health care provider. Make sure you discuss any questions you have with your health care provider. Document Revised: 06/11/2020 Document Reviewed:  06/11/2020 Elsevier Patient Education  2021 [employer].

## 2021-02-20 ENCOUNTER — Ambulatory Visit: Payer: Self-pay

## 2021-02-22 ENCOUNTER — Other Ambulatory Visit: Payer: Self-pay | Admitting: Nurse Practitioner

## 2021-02-24 ENCOUNTER — Telehealth: Payer: Self-pay | Admitting: Pharmacist

## 2021-02-24 DIAGNOSIS — I10 Essential (primary) hypertension: Secondary | ICD-10-CM | POA: Diagnosis not present

## 2021-02-24 DIAGNOSIS — J449 Chronic obstructive pulmonary disease, unspecified: Secondary | ICD-10-CM | POA: Diagnosis not present

## 2021-02-24 DIAGNOSIS — E1142 Type 2 diabetes mellitus with diabetic polyneuropathy: Secondary | ICD-10-CM | POA: Diagnosis not present

## 2021-02-24 DIAGNOSIS — M19012 Primary osteoarthritis, left shoulder: Secondary | ICD-10-CM | POA: Diagnosis not present

## 2021-02-24 DIAGNOSIS — E11621 Type 2 diabetes mellitus with foot ulcer: Secondary | ICD-10-CM | POA: Diagnosis not present

## 2021-02-24 DIAGNOSIS — E1151 Type 2 diabetes mellitus with diabetic peripheral angiopathy without gangrene: Secondary | ICD-10-CM | POA: Diagnosis not present

## 2021-02-24 DIAGNOSIS — L97421 Non-pressure chronic ulcer of left heel and midfoot limited to breakdown of skin: Secondary | ICD-10-CM | POA: Diagnosis not present

## 2021-02-24 DIAGNOSIS — I110 Hypertensive heart disease with heart failure: Secondary | ICD-10-CM | POA: Diagnosis not present

## 2021-02-24 DIAGNOSIS — I509 Heart failure, unspecified: Secondary | ICD-10-CM | POA: Diagnosis not present

## 2021-02-24 DIAGNOSIS — E1165 Type 2 diabetes mellitus with hyperglycemia: Secondary | ICD-10-CM | POA: Diagnosis not present

## 2021-02-24 DIAGNOSIS — K219 Gastro-esophageal reflux disease without esophagitis: Secondary | ICD-10-CM | POA: Diagnosis not present

## 2021-02-24 DIAGNOSIS — M19011 Primary osteoarthritis, right shoulder: Secondary | ICD-10-CM | POA: Diagnosis not present

## 2021-02-24 NOTE — Progress Notes (Addendum)
    Chronic Care Management Pharmacy Assistant   Name: [name]  MRN: [id number] DOB: March 15, 1951  Reason for Encounter: Adherence Review  Medications: Outpatient Encounter Medications as of 02/24/2021  Medication Sig   aspirin EC 81 MG tablet Take 81 mg by mouth daily.   Calcium Carbonate-Vitamin D (CALCIUM PLUS VITAMIN D PO) Take 1 tablet by mouth daily.   clopidogrel (PLAVIX) 75 MG tablet Take 1 tablet (75 mg total) by mouth daily.   cyclobenzaprine (FLEXERIL) 10 MG tablet Take 1 tablet (10 mg total) by mouth 3 (three) times daily as needed for muscle spasms.   gabapentin (NEURONTIN) 300 MG capsule Take 1 capsule (300 mg total) by mouth 3 (three) times daily.   glucose blood (ACCU-CHEK GUIDE) test strip Use as instructed   glyBURIDE (DIABETA) 5 MG tablet Take 1 tablet (5 mg total) by mouth 2 (two) times daily with a meal.   Lancets (ACCU-CHEK SOFT TOUCH) lancets Use as instructed   lovastatin (MEVACOR) 10 MG tablet Take 1 tab  Po QHS   meloxicam (MOBIC) 15 MG tablet Take by mouth.   metoprolol succinate (TOPROL-XL) 50 MG 24 hr tablet Take 50 mg by mouth daily. Take with or immediately following a meal.   mometasone-formoterol (DULERA) 100-5 MCG/ACT AERO Inhale 2 puffs into the lungs 2 (two) times daily as needed for wheezing.   ondansetron (ZOFRAN-ODT) 4 MG disintegrating tablet Take 1 tablet (4 mg total) by mouth every 8 (eight) hours as needed for nausea or vomiting.   traMADol (ULTRAM) 50 MG tablet TAKE 1 TABLET BY MOUTH EVERY 12 HOURS ASNEEDED FOR PAIN   No facility-administered encounter medications on file as of 02/24/2021.   Reviewed the chart for any medical/health and/or medication changes there were not any at this time.   Follow-Up:Pharmacist Review  Charlann Lange, [place] Pharmacist Assistant [phone]

## 2021-02-27 DIAGNOSIS — M19012 Primary osteoarthritis, left shoulder: Secondary | ICD-10-CM | POA: Diagnosis not present

## 2021-02-27 DIAGNOSIS — E11621 Type 2 diabetes mellitus with foot ulcer: Secondary | ICD-10-CM | POA: Diagnosis not present

## 2021-02-27 DIAGNOSIS — I110 Hypertensive heart disease with heart failure: Secondary | ICD-10-CM | POA: Diagnosis not present

## 2021-02-27 DIAGNOSIS — J449 Chronic obstructive pulmonary disease, unspecified: Secondary | ICD-10-CM | POA: Diagnosis not present

## 2021-02-27 DIAGNOSIS — E1142 Type 2 diabetes mellitus with diabetic polyneuropathy: Secondary | ICD-10-CM | POA: Diagnosis not present

## 2021-02-27 DIAGNOSIS — E1151 Type 2 diabetes mellitus with diabetic peripheral angiopathy without gangrene: Secondary | ICD-10-CM | POA: Diagnosis not present

## 2021-02-27 DIAGNOSIS — M19011 Primary osteoarthritis, right shoulder: Secondary | ICD-10-CM | POA: Diagnosis not present

## 2021-02-27 DIAGNOSIS — I509 Heart failure, unspecified: Secondary | ICD-10-CM | POA: Diagnosis not present

## 2021-02-27 DIAGNOSIS — L97421 Non-pressure chronic ulcer of left heel and midfoot limited to breakdown of skin: Secondary | ICD-10-CM | POA: Diagnosis not present

## 2021-03-02 ENCOUNTER — Other Ambulatory Visit: Payer: Self-pay

## 2021-03-02 DIAGNOSIS — I509 Heart failure, unspecified: Secondary | ICD-10-CM | POA: Diagnosis not present

## 2021-03-02 DIAGNOSIS — E11621 Type 2 diabetes mellitus with foot ulcer: Secondary | ICD-10-CM | POA: Diagnosis not present

## 2021-03-02 DIAGNOSIS — J449 Chronic obstructive pulmonary disease, unspecified: Secondary | ICD-10-CM | POA: Diagnosis not present

## 2021-03-02 DIAGNOSIS — E1151 Type 2 diabetes mellitus with diabetic peripheral angiopathy without gangrene: Secondary | ICD-10-CM | POA: Diagnosis not present

## 2021-03-02 DIAGNOSIS — M19012 Primary osteoarthritis, left shoulder: Secondary | ICD-10-CM | POA: Diagnosis not present

## 2021-03-02 DIAGNOSIS — M542 Cervicalgia: Secondary | ICD-10-CM

## 2021-03-02 DIAGNOSIS — I110 Hypertensive heart disease with heart failure: Secondary | ICD-10-CM | POA: Diagnosis not present

## 2021-03-02 DIAGNOSIS — E1142 Type 2 diabetes mellitus with diabetic polyneuropathy: Secondary | ICD-10-CM | POA: Diagnosis not present

## 2021-03-02 DIAGNOSIS — L97421 Non-pressure chronic ulcer of left heel and midfoot limited to breakdown of skin: Secondary | ICD-10-CM | POA: Diagnosis not present

## 2021-03-02 DIAGNOSIS — G8929 Other chronic pain: Secondary | ICD-10-CM

## 2021-03-02 DIAGNOSIS — M19011 Primary osteoarthritis, right shoulder: Secondary | ICD-10-CM | POA: Diagnosis not present

## 2021-03-02 MED ORDER — CYCLOBENZAPRINE HCL 10 MG PO TABS
10.0000 mg | ORAL_TABLET | Freq: Three times a day (TID) | ORAL | 0 refills | Status: DC | PRN
Start: 2021-03-02 — End: 2021-03-17

## 2021-03-04 DIAGNOSIS — E1142 Type 2 diabetes mellitus with diabetic polyneuropathy: Secondary | ICD-10-CM | POA: Diagnosis not present

## 2021-03-04 DIAGNOSIS — I509 Heart failure, unspecified: Secondary | ICD-10-CM | POA: Diagnosis not present

## 2021-03-04 DIAGNOSIS — L97421 Non-pressure chronic ulcer of left heel and midfoot limited to breakdown of skin: Secondary | ICD-10-CM | POA: Diagnosis not present

## 2021-03-04 DIAGNOSIS — E1151 Type 2 diabetes mellitus with diabetic peripheral angiopathy without gangrene: Secondary | ICD-10-CM | POA: Diagnosis not present

## 2021-03-04 DIAGNOSIS — J439 Emphysema, unspecified: Secondary | ICD-10-CM | POA: Diagnosis not present

## 2021-03-04 DIAGNOSIS — E11621 Type 2 diabetes mellitus with foot ulcer: Secondary | ICD-10-CM | POA: Diagnosis not present

## 2021-03-04 DIAGNOSIS — I110 Hypertensive heart disease with heart failure: Secondary | ICD-10-CM | POA: Diagnosis not present

## 2021-03-04 DIAGNOSIS — J449 Chronic obstructive pulmonary disease, unspecified: Secondary | ICD-10-CM | POA: Diagnosis not present

## 2021-03-04 DIAGNOSIS — M19011 Primary osteoarthritis, right shoulder: Secondary | ICD-10-CM | POA: Diagnosis not present

## 2021-03-04 DIAGNOSIS — M19012 Primary osteoarthritis, left shoulder: Secondary | ICD-10-CM | POA: Diagnosis not present

## 2021-03-05 DIAGNOSIS — L97421 Non-pressure chronic ulcer of left heel and midfoot limited to breakdown of skin: Secondary | ICD-10-CM | POA: Diagnosis not present

## 2021-03-05 DIAGNOSIS — I110 Hypertensive heart disease with heart failure: Secondary | ICD-10-CM | POA: Diagnosis not present

## 2021-03-05 DIAGNOSIS — E1142 Type 2 diabetes mellitus with diabetic polyneuropathy: Secondary | ICD-10-CM | POA: Diagnosis not present

## 2021-03-05 DIAGNOSIS — E11621 Type 2 diabetes mellitus with foot ulcer: Secondary | ICD-10-CM | POA: Diagnosis not present

## 2021-03-05 DIAGNOSIS — M19012 Primary osteoarthritis, left shoulder: Secondary | ICD-10-CM | POA: Diagnosis not present

## 2021-03-05 DIAGNOSIS — E1151 Type 2 diabetes mellitus with diabetic peripheral angiopathy without gangrene: Secondary | ICD-10-CM | POA: Diagnosis not present

## 2021-03-05 DIAGNOSIS — I509 Heart failure, unspecified: Secondary | ICD-10-CM | POA: Diagnosis not present

## 2021-03-05 DIAGNOSIS — J449 Chronic obstructive pulmonary disease, unspecified: Secondary | ICD-10-CM | POA: Diagnosis not present

## 2021-03-05 DIAGNOSIS — M19011 Primary osteoarthritis, right shoulder: Secondary | ICD-10-CM | POA: Diagnosis not present

## 2021-03-07 ENCOUNTER — Other Ambulatory Visit: Payer: Self-pay | Admitting: Nurse Practitioner

## 2021-03-09 DIAGNOSIS — M19012 Primary osteoarthritis, left shoulder: Secondary | ICD-10-CM | POA: Diagnosis not present

## 2021-03-09 DIAGNOSIS — L97421 Non-pressure chronic ulcer of left heel and midfoot limited to breakdown of skin: Secondary | ICD-10-CM | POA: Diagnosis not present

## 2021-03-09 DIAGNOSIS — J449 Chronic obstructive pulmonary disease, unspecified: Secondary | ICD-10-CM | POA: Diagnosis not present

## 2021-03-09 DIAGNOSIS — E11621 Type 2 diabetes mellitus with foot ulcer: Secondary | ICD-10-CM | POA: Diagnosis not present

## 2021-03-09 DIAGNOSIS — I509 Heart failure, unspecified: Secondary | ICD-10-CM | POA: Diagnosis not present

## 2021-03-09 DIAGNOSIS — I110 Hypertensive heart disease with heart failure: Secondary | ICD-10-CM | POA: Diagnosis not present

## 2021-03-09 DIAGNOSIS — M19011 Primary osteoarthritis, right shoulder: Secondary | ICD-10-CM | POA: Diagnosis not present

## 2021-03-09 DIAGNOSIS — E1151 Type 2 diabetes mellitus with diabetic peripheral angiopathy without gangrene: Secondary | ICD-10-CM | POA: Diagnosis not present

## 2021-03-09 DIAGNOSIS — E1142 Type 2 diabetes mellitus with diabetic polyneuropathy: Secondary | ICD-10-CM | POA: Diagnosis not present

## 2021-03-10 ENCOUNTER — Telehealth: Payer: Self-pay | Admitting: Pharmacist

## 2021-03-10 NOTE — Progress Notes (Addendum)
Chronic Care Management Pharmacy Assistant   Name: [name]  MRN: [id number] DOB: 1951/04/25  Reason for Encounter: Disease State For HTN   Conditions to be addressed/monitored: HTN, PAD, SVT, GERD, Type II DM, Lipids, Arthritis  Recent office visits:  None since 02/24/21  Recent consult visits:  None since 02/24/21  Hospital visits:  None since 02/24/21  Medications: Outpatient Encounter Medications as of 03/10/2021  Medication Sig   aspirin EC 81 MG tablet Take 81 mg by mouth daily.   Calcium Carbonate-Vitamin D (CALCIUM PLUS VITAMIN D PO) Take 1 tablet by mouth daily.   clopidogrel (PLAVIX) 75 MG tablet Take 1 tablet (75 mg total) by mouth daily.   cyclobenzaprine (FLEXERIL) 10 MG tablet Take 1 tablet (10 mg total) by mouth 3 (three) times daily as needed for muscle spasms.   gabapentin (NEURONTIN) 300 MG capsule Take 1 capsule (300 mg total) by mouth 3 (three) times daily.   glucose blood (ACCU-CHEK GUIDE) test strip Use as instructed   glyBURIDE (DIABETA) 5 MG tablet Take 1 tablet (5 mg total) by mouth 2 (two) times daily with a meal.   Lancets (ACCU-CHEK SOFT TOUCH) lancets Use as instructed   lovastatin (MEVACOR) 10 MG tablet Take 1 tab  Po QHS   meloxicam (MOBIC) 15 MG tablet Take by mouth.   metoprolol succinate (TOPROL-XL) 50 MG 24 hr tablet Take 50 mg by mouth daily. Take with or immediately following a meal.   mometasone-formoterol (DULERA) 100-5 MCG/ACT AERO Inhale 2 puffs into the lungs 2 (two) times daily as needed for wheezing.   ondansetron (ZOFRAN-ODT) 4 MG disintegrating tablet Take 1 tablet (4 mg total) by mouth every 8 (eight) hours as needed for nausea or vomiting.   traMADol (ULTRAM) 50 MG tablet TAKE 1 TABLET BY MOUTH EVERY 12 HOURS ASNEEDED FOR PAIN   No facility-administered encounter medications on file as of 03/10/2021.    Reviewed chart prior to disease state call. Spoke with patient regarding BP  Recent Office Vitals: BP Readings from Last  3 Encounters:  01/28/21 119/62  12/30/20 112/68  12/16/20 (!) 85/46   Pulse Readings from Last 3 Encounters:  01/28/21 77  12/30/20 80  12/16/20 62    Wt Readings from Last 3 Encounters:  01/28/21 155 lb (70.3 kg)  12/30/20 152 lb 3.2 oz (69 kg)  12/02/20 180 lb (81.6 kg)     Kidney Function Lab Results  Component Value Date/Time   CREATININE 0.92 11/23/2020 05:02 AM   CREATININE 0.92 11/20/2020 11:28 PM   CREATININE 1.30 04/17/2014 10:26 AM   CREATININE 1.60 (H) 12/17/2013 04:12 PM   GFRNONAA >60 11/23/2020 05:02 AM   GFRNONAA 59 (L) 04/17/2014 10:26 AM   GFRAA >60 06/20/2020 10:01 AM   GFRAA >60 04/17/2014 10:26 AM    BMP Latest Ref Rng & Units 11/23/2020 11/20/2020 11/20/2020  Glucose 70 - 99 mg/dL - - -  BUN 8 - 23 mg/dL - - -  Creatinine 0.61 - 1.24 mg/dL 0.92 0.92 1.01  Sodium 135 - 145 mmol/L - - -  Potassium 3.5 - 5.1 mmol/L - - -  Chloride 98 - 111 mmol/L - - -  CO2 22 - 32 mmol/L - - -  Calcium 8.9 - 10.3 mg/dL - - -    Current antihypertensive regimen:   Metoprolol to 12.5 mg daily.  How often are you checking your Blood Pressure? Patient stated he does not have a blood pressure machine but he has a  nurse come frequently and they check it when they come.   Current home BP readings: Patient does not check his blood pressure because he does not have a blood pressure machine.   What recent interventions/DTPs have been made by any provider to improve Blood Pressure control since last CPP Visit: None.  Any recent hospitalizations or ED visits since last visit with CPP? Patient stated no.  What diet changes have been made to improve Blood Pressure Control?  Patient stated he eats three times a day and drinks a lot of water. He stated he is trying to improve his fruit in take.   What exercise is being done to improve your Blood Pressure Control?  Patient stated he gets around the house but doesn't do much exercise.  Adherence Review: Is the patient currently  on ACE/ARB medication? N/A  Does the patient have >5 day gap between last estimated fill dates? Per misc rpts, no.  Star Rating Drugs: Lovastatin 10 mg 90 DS 12/05/20 Gabapentin 300 mg 30 DS 12/01/20 Glyburide 5 mg 90 DS 12/01/20  Patient stated he hasn't been checking his pulse because he was unable to find the pulse monitor but I spoke with his daughter and she is going to look around the house for one and start having him write his pulse readings down.   Follow-Up:Pharmacist Review  [name], RMA Clinical Pharmacist Assistant [phone]  10 minutes spent in review, coordination, and documentation.  Reviewed by: [name], PharmD Clinical Pharmacist [facility] [phone]

## 2021-03-11 DIAGNOSIS — I509 Heart failure, unspecified: Secondary | ICD-10-CM | POA: Diagnosis not present

## 2021-03-11 DIAGNOSIS — L97421 Non-pressure chronic ulcer of left heel and midfoot limited to breakdown of skin: Secondary | ICD-10-CM | POA: Diagnosis not present

## 2021-03-11 DIAGNOSIS — M19012 Primary osteoarthritis, left shoulder: Secondary | ICD-10-CM | POA: Diagnosis not present

## 2021-03-11 DIAGNOSIS — E1151 Type 2 diabetes mellitus with diabetic peripheral angiopathy without gangrene: Secondary | ICD-10-CM | POA: Diagnosis not present

## 2021-03-11 DIAGNOSIS — E11621 Type 2 diabetes mellitus with foot ulcer: Secondary | ICD-10-CM | POA: Diagnosis not present

## 2021-03-11 DIAGNOSIS — E1142 Type 2 diabetes mellitus with diabetic polyneuropathy: Secondary | ICD-10-CM | POA: Diagnosis not present

## 2021-03-11 DIAGNOSIS — I110 Hypertensive heart disease with heart failure: Secondary | ICD-10-CM | POA: Diagnosis not present

## 2021-03-11 DIAGNOSIS — M19011 Primary osteoarthritis, right shoulder: Secondary | ICD-10-CM | POA: Diagnosis not present

## 2021-03-11 DIAGNOSIS — J449 Chronic obstructive pulmonary disease, unspecified: Secondary | ICD-10-CM | POA: Diagnosis not present

## 2021-03-13 ENCOUNTER — Other Ambulatory Visit: Payer: Self-pay

## 2021-03-13 MED ORDER — TRUE METRIX BLOOD GLUCOSE TEST VI STRP: 1.0000 | ORAL_STRIP | 1 refills | Status: DC | PRN

## 2021-03-13 MED ORDER — METOPROLOL SUCCINATE ER 50 MG PO TB24: 50.0000 mg | ORAL_TABLET | Freq: Every day | ORAL | 1 refills | Status: DC

## 2021-03-13 MED ORDER — TRUE METRIX GO GLUCOSE METER W/DEVICE KIT: PACK | 0 refills | Status: DC

## 2021-03-16 ENCOUNTER — Other Ambulatory Visit: Payer: Self-pay

## 2021-03-16 MED ORDER — LOVASTATIN 10 MG PO TABS: ORAL_TABLET | ORAL | 1 refills | Status: DC

## 2021-03-17 ENCOUNTER — Other Ambulatory Visit: Payer: Self-pay

## 2021-03-17 DIAGNOSIS — E11621 Type 2 diabetes mellitus with foot ulcer: Secondary | ICD-10-CM | POA: Diagnosis not present

## 2021-03-17 DIAGNOSIS — M19011 Primary osteoarthritis, right shoulder: Secondary | ICD-10-CM | POA: Diagnosis not present

## 2021-03-17 DIAGNOSIS — E1151 Type 2 diabetes mellitus with diabetic peripheral angiopathy without gangrene: Secondary | ICD-10-CM | POA: Diagnosis not present

## 2021-03-17 DIAGNOSIS — I110 Hypertensive heart disease with heart failure: Secondary | ICD-10-CM | POA: Diagnosis not present

## 2021-03-17 DIAGNOSIS — I509 Heart failure, unspecified: Secondary | ICD-10-CM | POA: Diagnosis not present

## 2021-03-17 DIAGNOSIS — L97421 Non-pressure chronic ulcer of left heel and midfoot limited to breakdown of skin: Secondary | ICD-10-CM | POA: Diagnosis not present

## 2021-03-17 DIAGNOSIS — M542 Cervicalgia: Secondary | ICD-10-CM

## 2021-03-17 DIAGNOSIS — E1142 Type 2 diabetes mellitus with diabetic polyneuropathy: Secondary | ICD-10-CM | POA: Diagnosis not present

## 2021-03-17 DIAGNOSIS — G8929 Other chronic pain: Secondary | ICD-10-CM

## 2021-03-17 DIAGNOSIS — J449 Chronic obstructive pulmonary disease, unspecified: Secondary | ICD-10-CM | POA: Diagnosis not present

## 2021-03-17 DIAGNOSIS — M19012 Primary osteoarthritis, left shoulder: Secondary | ICD-10-CM | POA: Diagnosis not present

## 2021-03-17 MED ORDER — TRUE METRIX BLOOD GLUCOSE TEST VI STRP: ORAL_STRIP | 1 refills | Status: DC

## 2021-03-17 MED ORDER — LOVASTATIN 10 MG PO TABS: ORAL_TABLET | ORAL | 1 refills | Status: DC

## 2021-03-17 MED ORDER — TRUE METRIX GO GLUCOSE METER W/DEVICE KIT: PACK | 0 refills | Status: DC

## 2021-03-17 MED ORDER — CYCLOBENZAPRINE HCL 10 MG PO TABS: 10.0000 mg | ORAL_TABLET | Freq: Three times a day (TID) | ORAL | 0 refills | Status: DC | PRN

## 2021-03-17 MED ORDER — METOPROLOL SUCCINATE ER 50 MG PO TB24: 50.0000 mg | ORAL_TABLET | Freq: Every day | ORAL | 1 refills | Status: DC

## 2021-03-17 MED ORDER — TRUE METRIX LEVEL 1 LOW VI SOLN: 0 refills | Status: DC

## 2021-03-17 NOTE — Telephone Encounter (Signed)
As per dr [name] ok to send cyclobenzaprine send 3 times a days for 90 days

## 2021-03-18 DIAGNOSIS — E1142 Type 2 diabetes mellitus with diabetic polyneuropathy: Secondary | ICD-10-CM | POA: Diagnosis not present

## 2021-03-18 DIAGNOSIS — E1151 Type 2 diabetes mellitus with diabetic peripheral angiopathy without gangrene: Secondary | ICD-10-CM | POA: Diagnosis not present

## 2021-03-18 DIAGNOSIS — I110 Hypertensive heart disease with heart failure: Secondary | ICD-10-CM | POA: Diagnosis not present

## 2021-03-18 DIAGNOSIS — L97423 Non-pressure chronic ulcer of left heel and midfoot with necrosis of muscle: Secondary | ICD-10-CM | POA: Diagnosis not present

## 2021-03-18 DIAGNOSIS — L97421 Non-pressure chronic ulcer of left heel and midfoot limited to breakdown of skin: Secondary | ICD-10-CM | POA: Diagnosis not present

## 2021-03-18 DIAGNOSIS — I509 Heart failure, unspecified: Secondary | ICD-10-CM | POA: Diagnosis not present

## 2021-03-18 DIAGNOSIS — M19011 Primary osteoarthritis, right shoulder: Secondary | ICD-10-CM | POA: Diagnosis not present

## 2021-03-18 DIAGNOSIS — I739 Peripheral vascular disease, unspecified: Secondary | ICD-10-CM | POA: Diagnosis not present

## 2021-03-18 DIAGNOSIS — A4101 Sepsis due to Methicillin susceptible Staphylococcus aureus: Secondary | ICD-10-CM | POA: Diagnosis not present

## 2021-03-18 DIAGNOSIS — L97529 Non-pressure chronic ulcer of other part of left foot with unspecified severity: Secondary | ICD-10-CM | POA: Diagnosis not present

## 2021-03-18 DIAGNOSIS — E11621 Type 2 diabetes mellitus with foot ulcer: Secondary | ICD-10-CM | POA: Diagnosis not present

## 2021-03-18 DIAGNOSIS — M19012 Primary osteoarthritis, left shoulder: Secondary | ICD-10-CM | POA: Diagnosis not present

## 2021-03-18 DIAGNOSIS — J449 Chronic obstructive pulmonary disease, unspecified: Secondary | ICD-10-CM | POA: Diagnosis not present

## 2021-03-20 ENCOUNTER — Other Ambulatory Visit: Payer: Self-pay

## 2021-03-20 NOTE — Patient Outreach (Signed)
[facility]) Care Management  03/20/2021  [name] 03-14-1951 [id number]   Telephone call to patient for follow up. No answer.  Unable to leave a message.  Plan: RN CM will attempt patient again within 4 business days.    [name], RN, MSN [facility] [facility] Care Management Coordinator Direct Line [phone] Cell [phone] Toll Free: [phone]  Fax: [phone]

## 2021-03-24 DIAGNOSIS — I110 Hypertensive heart disease with heart failure: Secondary | ICD-10-CM | POA: Diagnosis not present

## 2021-03-24 DIAGNOSIS — E1151 Type 2 diabetes mellitus with diabetic peripheral angiopathy without gangrene: Secondary | ICD-10-CM | POA: Diagnosis not present

## 2021-03-24 DIAGNOSIS — E11621 Type 2 diabetes mellitus with foot ulcer: Secondary | ICD-10-CM | POA: Diagnosis not present

## 2021-03-24 DIAGNOSIS — M19011 Primary osteoarthritis, right shoulder: Secondary | ICD-10-CM | POA: Diagnosis not present

## 2021-03-24 DIAGNOSIS — M19012 Primary osteoarthritis, left shoulder: Secondary | ICD-10-CM | POA: Diagnosis not present

## 2021-03-24 DIAGNOSIS — L97421 Non-pressure chronic ulcer of left heel and midfoot limited to breakdown of skin: Secondary | ICD-10-CM | POA: Diagnosis not present

## 2021-03-24 DIAGNOSIS — J449 Chronic obstructive pulmonary disease, unspecified: Secondary | ICD-10-CM | POA: Diagnosis not present

## 2021-03-24 DIAGNOSIS — E1142 Type 2 diabetes mellitus with diabetic polyneuropathy: Secondary | ICD-10-CM | POA: Diagnosis not present

## 2021-03-24 DIAGNOSIS — I509 Heart failure, unspecified: Secondary | ICD-10-CM | POA: Diagnosis not present

## 2021-03-26 ENCOUNTER — Other Ambulatory Visit: Payer: Self-pay

## 2021-03-26 DIAGNOSIS — K219 Gastro-esophageal reflux disease without esophagitis: Secondary | ICD-10-CM | POA: Diagnosis not present

## 2021-03-26 DIAGNOSIS — E1165 Type 2 diabetes mellitus with hyperglycemia: Secondary | ICD-10-CM | POA: Diagnosis not present

## 2021-03-26 DIAGNOSIS — I10 Essential (primary) hypertension: Secondary | ICD-10-CM | POA: Diagnosis not present

## 2021-03-26 NOTE — Patient Instructions (Signed)
Goals Addressed             This Visit's Progress    COMPLETED: Careful Skin Care-wound to left heel   On track    Barriers: Knowledge Timeframe:  Short-Term Goal Priority:  High Start Date:   11/25/20                          Expected End Date:  05/27/21              Follow Up Date  03/26/21  - clean and dry skin well  -keep wound vac intact -monitor for signs such as redness, swelling or pain to site   Why is this important?    Taking really good care of your skin will help to keep your skin unbroken.    Notes: 11/25/20 Patient reports wound vac intact with no redness or swelling.  12/03/20 Patient to wear wound vac for 2 more weeks. 12/09/20 Wound making progress per patient 12/22/20 Wound making progress 01/15/21 Patient reports some wound closure.  Patient recently saw podiatrist.   02/19/21 Wound vac discontinued.  Wound clean and dry.  Home health changing dressing weekly per patient. 03/26/21 Wound to heel completely healed.  Advised to keep pressure off wound.        COMPLETED: Make and Keep All Appointments   On track    Barriers: None Timeframe:  Long-Range Goal Priority:  High Start Date:    11/25/20                         Expected End Date:    05/27/21                   Follow Up Date 03/26/21   - call to cancel if needed    Why is this important?   Part of staying healthy is seeing the doctor for follow-up care.  If you forget your appointments, there are some things you can do to stay on track.    Notes: Patient daughter to make follow up appointments today 11/25/20.  12/03/20 patient seeing physicians as scheduled.  12/09/20 Patient has no problems with transportation.  01/15/21 Patient reports seeing physicians as scheduled. 02/19/21 No transportation problems.   03/26/21 daughter continues to take to appointments.        Monitor and Manage My Blood Sugar-Diabetes Type 2   On track    Barriers: Health Behaviors Knowledge Timeframe:  Long-Range Goal Priority:   High Start Date:    11/25/20                         Expected End Date:   09/26/21 Follow Up Date 04/26/21 - check blood sugar at prescribed times    Why is this important?   Checking your blood sugar at home helps to keep it from getting very high or very low.  Writing the results in a diary or log helps the doctor know how to care for you.  Your blood sugar log should have the time, date and the results.  Also, write down the amount of insulin or other medicine that you take.  Other information, like what you ate, exercise done and how you were feeling, will also be helpful.     Notes: Patient currently not checking encouraged to check. 12/03/20 Patient not checking sugars currently. Encouraged patient to check sugars. 12/09/20 Patient checked blood  sugar it was 223.   12/22/20  PLEASE CHECK YOUR BLOOD SUGARS. 01/15/21 Please check your sugars.   02/17/21 Not checking sugars at home.  "Hard to bleed"  02/19/21 Not checking sugars. He states he does not bleed.  Discussed methods to promote bleeding. 03/26/21 Patient reports checking sugars now.  Encouraged to continue.  Blood sugar this am was 190.   Diabetes Management Discussed: Medication adherence Reviewed importance of limiting carbs such as rice, potatoes, breads, and pastas. Also discussed limiting sweets and sugary drinks.  Discussed importance of portion control.  Also discussed importance of annual exams, foot exams, and eye exams.

## 2021-03-26 NOTE — Patient Outreach (Signed)
[name]) Care Management  [place]  03/26/2021   [name] Oct 05, 1950 [id number]  Subjective: Telephone call to patient for follow up. He reports doing good. He did loss his spouse recently but reports that he still has support of his family.  Left foot wound healed. Discussed keeping pressure off area to prevent reopening. He verbalized understanding. Patient reports he checked his sugar today and it was 190.  Encouraged patient to continue to monitor.  He verbalized understanding and voices no concerns.   Objective:   Encounter Medications:  Outpatient Encounter Medications as of 03/26/2021  Medication Sig   Alcohol Swabs (B-D SINGLE USE SWABS REGULAR) PADS by Does not apply route. Use as directed   aspirin EC 81 MG tablet Take 81 mg by mouth daily.   Blood Glucose Calibration (TRUE METRIX LEVEL 1) Low SOLN Use as directed when needed dx E11.65   Blood Glucose Monitoring Suppl (TRUE METRIX GO GLUCOSE METER) w/Device KIT Use as directed to check glucose DX e11.65   Calcium Carbonate-Vitamin D (CALCIUM PLUS VITAMIN D PO) Take 1 tablet by mouth daily.   clopidogrel (PLAVIX) 75 MG tablet Take 1 tablet (75 mg total) by mouth daily.   cyclobenzaprine (FLEXERIL) 10 MG tablet Take 1 tablet (10 mg total) by mouth 3 (three) times daily as needed for muscle spasms.   gabapentin (NEURONTIN) 300 MG capsule Take 1 capsule (300 mg total) by mouth 3 (three) times daily.   glucose blood (TRUE METRIX BLOOD GLUCOSE TEST) test strip Use as directed twice dailyDx E11.65   glyBURIDE (DIABETA) 5 MG tablet Take 1 tablet (5 mg total) by mouth 2 (two) times daily with a meal.   Lancets (ACCU-CHEK SOFT TOUCH) lancets Use as instructed   lovastatin (MEVACOR) 10 MG tablet Take 1 tab  Po QHS   meloxicam (MOBIC) 15 MG tablet Take by mouth.   metoprolol succinate (TOPROL-XL) 50 MG 24 hr tablet Take 1 tablet (50 mg total) by mouth daily. Take with or immediately following a meal.    mometasone-formoterol (DULERA) 100-5 MCG/ACT AERO Inhale 2 puffs into the lungs 2 (two) times daily as needed for wheezing.   ondansetron (ZOFRAN-ODT) 4 MG disintegrating tablet Take 1 tablet (4 mg total) by mouth every 8 (eight) hours as needed for nausea or vomiting.   traMADol (ULTRAM) 50 MG tablet TAKE 1 TABLET BY MOUTH EVERY 12 HOURS ASNEEDED FOR PAIN   TRUEplus Lancets 30G MISC by Does not apply route. Use as directed twice a daily Dx E11.65   No facility-administered encounter medications on file as of 03/26/2021.    Functional Status:  In your present state of health, do you have any difficulty performing the following activities: 03/26/2021 11/25/2020  Hearing? N N  Vision? N N  Difficulty concentrating or making decisions? N N  Walking or climbing stairs? Y Y  Comment weakness and foot wound weakness and foot wound  Dressing or bathing? N N  Doing errands, shopping? N N  Preparing Food and eating ? N N  Using the Toilet? N N  In the past six months, have you accidently leaked urine? N N  Do you have problems with loss of bowel control? N N  Managing your Medications? N N  Managing your Finances? N N  Housekeeping or managing your Housekeeping? Tempie Donning  Comment daughter [name] helps daughter [name] helps  Some recent data might be hidden    Fall/Depression Screening: Fall Risk  03/26/2021 01/28/2021 12/30/2020  Falls in the past year? 0 0 0  Number falls in past yr: - - -  Injury with Fall? - - -   PHQ 2/9 Scores 01/28/2021 12/30/2020 11/25/2020 09/22/2020 06/20/2020 03/20/2020 12/17/2019  PHQ - 2 Score 0 0 0 0 0 0 0    Assessment:   Care Plan Care Plan : General Plan of Care (Adult)  Updates made by [name], RN since 03/26/2021 12:00 AM     Problem: Health Promotion or Disease Self-Management (General Plan of Care)      Goal: Self-Management Plan Developed as evidenced by patient left heel wound improvement Completed 03/26/2021  Start Date: 11/25/2020  Expected End Date:  05/27/2021  Recent Progress: On track  Priority: High  Note:   Discussed signs of infection with patient.  Encouraged patient to ask questions and follow guidance from providers.     Notes: 12/03/20 patient reports that MD pleased with wound progress but wants patient to wear wound vac another two weeks.   12/09/20 Patient reports that wound nurse states that wound is much better. 12/22/20 Wound vac continues. 01/15/21 Patient reports wound is closing up.  Discussed wound infection symptoms and not to bear weight on his heel.  He verbalized understanding.  02/19/21 Wound closing up. No longer with wound vac.  Dressing change now weekly with home health.  Discussed signs of infection.   03/26/21 Wound to heel completely healed.  Advised to keep pressure off wound.      Task: Mutually Develop and Royce Macadamia Achievement of Patient Goals Completed 03/26/2021  Due Date: 06/26/2021  Responsible User: [name], RN  Note:   Care Management Activities:    - collaboration with team encouraged - questions answered - reassurance provided    Notes: Patient has wound vac to left heel presently. Patient to follow up with Dr. Vickki Muff.   01/15/21 Reiterated signs of infection and no weightbearing on heel.   12/09/20 Patient continues to have wound vac. Wound making progress.   12/22/20 [place] continues to have wound vac dressing changes MWF by HHN.  Patient reports wound making progress per home health.  02/19/21 Wound vac discontinued.  Discussed signs of infection.  Weekly dressing changes.        Goals Addressed             This Visit's Progress    COMPLETED: Careful Skin Care-wound to left heel   On track    Barriers: Knowledge Timeframe:  Short-Term Goal Priority:  High Start Date:   11/25/20                          Expected End Date:  05/27/21              Follow Up Date  03/26/21  - clean and dry skin well  -keep wound vac intact -monitor for signs such as redness, swelling or pain to site    Why is this important?    Taking really good care of your skin will help to keep your skin unbroken.    Notes: 11/25/20 Patient reports wound vac intact with no redness or swelling.  12/03/20 Patient to wear wound vac for 2 more weeks. 12/09/20 Wound making progress per patient 12/22/20 Wound making progress 01/15/21 Patient reports some wound closure.  Patient recently saw podiatrist.   02/19/21 Wound vac discontinued.  Wound clean and dry.  Home health changing dressing weekly per patient. 03/26/21 Wound to heel completely healed.  Advised to keep pressure off wound.        COMPLETED: Make and Keep All Appointments   On track    Barriers: None Timeframe:  Long-Range Goal Priority:  High Start Date:    11/25/20                         Expected End Date:    05/27/21                   Follow Up Date 03/26/21   - call to cancel if needed    Why is this important?   Part of staying healthy is seeing the doctor for follow-up care.  If you forget your appointments, there are some things you can do to stay on track.    Notes: Patient daughter to make follow up appointments today 11/25/20.  12/03/20 patient seeing physicians as scheduled.  12/09/20 Patient has no problems with transportation.  01/15/21 Patient reports seeing physicians as scheduled. 02/19/21 No transportation problems.   03/26/21 daughter continues to take to appointments.        Monitor and Manage My Blood Sugar-Diabetes Type 2   On track    Barriers: Health Behaviors Knowledge Timeframe:  Long-Range Goal Priority:  High Start Date:    11/25/20                         Expected End Date:   09/26/21 Follow Up Date 04/26/21 - check blood sugar at prescribed times    Why is this important?   Checking your blood sugar at home helps to keep it from getting very high or very low.  Writing the results in a diary or log helps the doctor know how to care for you.  Your blood sugar log should have the time, date and the results.  Also,  write down the amount of insulin or other medicine that you take.  Other information, like what you ate, exercise done and how you were feeling, will also be helpful.     Notes: Patient currently not checking encouraged to check. 12/03/20 Patient not checking sugars currently. Encouraged patient to check sugars. 12/09/20 Patient checked blood sugar it was 223.   12/22/20  PLEASE CHECK YOUR BLOOD SUGARS. 01/15/21 Please check your sugars.   02/17/21 Not checking sugars at home.  "Hard to bleed"  02/19/21 Not checking sugars. He states he does not bleed.  Discussed methods to promote bleeding. 03/26/21 Patient reports checking sugars now.  Encouraged to continue.  Blood sugar this am was 190.   Diabetes Management Discussed: Medication adherence Reviewed importance of limiting carbs such as rice, potatoes, breads, and pastas. Also discussed limiting sweets and sugary drinks.  Discussed importance of portion control.  Also discussed importance of annual exams, foot exams, and eye exams.            Plan:  Follow-up: Patient agrees to Care Plan and Follow-up. Follow-up in 3-4 week(s) RN CM will send update to physician.  Dionne J Leath, RN, MSN [facility] Care Management Care Management Coordinator Direct Line [phone] Cell [phone] Toll Free: [phone]  Fax: [phone]     

## 2021-03-27 ENCOUNTER — Other Ambulatory Visit: Payer: Self-pay | Admitting: Internal Medicine

## 2021-03-27 DIAGNOSIS — R110 Nausea: Secondary | ICD-10-CM

## 2021-04-02 ENCOUNTER — Other Ambulatory Visit: Payer: Self-pay

## 2021-04-02 ENCOUNTER — Encounter: Payer: Self-pay | Admitting: Physician Assistant

## 2021-04-02 ENCOUNTER — Ambulatory Visit (INDEPENDENT_AMBULATORY_CARE_PROVIDER_SITE_OTHER): Payer: Medicare HMO | Admitting: Physician Assistant

## 2021-04-02 DIAGNOSIS — J449 Chronic obstructive pulmonary disease, unspecified: Secondary | ICD-10-CM

## 2021-04-02 DIAGNOSIS — I739 Peripheral vascular disease, unspecified: Secondary | ICD-10-CM | POA: Diagnosis not present

## 2021-04-02 DIAGNOSIS — I10 Essential (primary) hypertension: Secondary | ICD-10-CM

## 2021-04-02 DIAGNOSIS — K219 Gastro-esophageal reflux disease without esophagitis: Secondary | ICD-10-CM | POA: Diagnosis not present

## 2021-04-02 DIAGNOSIS — E782 Mixed hyperlipidemia: Secondary | ICD-10-CM | POA: Diagnosis not present

## 2021-04-02 DIAGNOSIS — R0902 Hypoxemia: Secondary | ICD-10-CM | POA: Diagnosis not present

## 2021-04-02 DIAGNOSIS — E1165 Type 2 diabetes mellitus with hyperglycemia: Secondary | ICD-10-CM

## 2021-04-02 MED ORDER — DEXCOM G6 SENSOR MISC: 3 refills | Status: DC

## 2021-04-02 MED ORDER — PANTOPRAZOLE SODIUM 40 MG PO TBEC: 40.0000 mg | DELAYED_RELEASE_TABLET | Freq: Every day | ORAL | 2 refills | Status: DC

## 2021-04-02 MED ORDER — DEXCOM G6 RECEIVER DEVI: 3 refills | Status: DC

## 2021-04-02 MED ORDER — DEXCOM G6 TRANSMITTER MISC: 3 refills | Status: DC

## 2021-04-02 NOTE — Progress Notes (Signed)
[facility] [place], [place] [address]  Internal MEDICINE  Office Visit Note  Patient Name: [name]  [id number]  [id number]  Date of Service: 04/07/2021  Chief Complaint  Patient presents with   Follow-up    Discuss meds, diabetic shoes   Diabetes   Gastroesophageal Reflux   Hyperlipidemia   Hypertension   COPD   Quality Metric Gaps    Shingrix, covid booster    HPI Pt is here for routine follow up -Has been followed by podiatry and has next follow up in August, got to take bandage off.Plans to discuss diabetic shoes with podiatrist at this visit. -usually does 2L oxygen at night and usually does not need it when up and walking around, but will bring just in case he starts getting SOB -Reports that pharmacist is working on cheaper option for inhaler and still waiting to hear about this--unfortunately no samples available in office today -Meoptrolol lowered to half the dose ($RemoveB'50mg'clrQnLCf$  total). BP well controlled and HR much improved. Followed by cardiology -BG in AM 100-160 max, would like a continuous monitor to avoid finger pricks especially given poor circulation. Will look into options such as Dexcom. Not yet due for A1c.  Current Medication: Outpatient Encounter Medications as of 04/02/2021  Medication Sig   Alcohol Swabs (B-D SINGLE USE SWABS REGULAR) PADS by Does not apply route. Use as directed   aspirin EC 81 MG tablet Take 81 mg by mouth daily.   Blood Glucose Calibration (TRUE METRIX LEVEL 1) Low SOLN Use as directed when needed dx E11.65   Blood Glucose Monitoring Suppl (TRUE METRIX GO GLUCOSE METER) w/Device KIT Use as directed to check glucose DX e11.65   Calcium Carbonate-Vitamin D (CALCIUM PLUS VITAMIN D PO) Take 1 tablet by mouth daily.   clopidogrel (PLAVIX) 75 MG tablet Take 1 tablet (75 mg total) by mouth daily.   Continuous Blood Gluc Receiver (DEXCOM G6 RECEIVER) DEVI Use as directed every 14 days to monitor blood sugars    Continuous Blood Gluc Sensor (DEXCOM G6 SENSOR) MISC Use as directed every 14 days to monitor blood sugars   Continuous Blood Gluc Transmit (DEXCOM G6 TRANSMITTER) MISC Use as directed every 14 days to monitor blood sugars   cyclobenzaprine (FLEXERIL) 10 MG tablet Take 1 tablet (10 mg total) by mouth 3 (three) times daily as needed for muscle spasms.   gabapentin (NEURONTIN) 300 MG capsule Take 1 capsule (300 mg total) by mouth 3 (three) times daily.   glucose blood (TRUE METRIX BLOOD GLUCOSE TEST) test strip Use as directed twice dailyDx E11.65   glyBURIDE (DIABETA) 5 MG tablet Take 1 tablet (5 mg total) by mouth 2 (two) times daily with a meal.   Lancets (ACCU-CHEK SOFT TOUCH) lancets Use as instructed   lovastatin (MEVACOR) 10 MG tablet Take 1 tab  Po QHS   meloxicam (MOBIC) 15 MG tablet Take by mouth.   metoprolol succinate (TOPROL-XL) 50 MG 24 hr tablet Take 1 tablet (50 mg total) by mouth daily. Take with or immediately following a meal.   mometasone-formoterol (DULERA) 100-5 MCG/ACT AERO Inhale 2 puffs into the lungs 2 (two) times daily as needed for wheezing.   pantoprazole (PROTONIX) 40 MG tablet Take 1 tablet (40 mg total) by mouth daily.   traMADol (ULTRAM) 50 MG tablet TAKE 1 TABLET BY MOUTH EVERY 12 HOURS ASNEEDED FOR PAIN   TRUEplus Lancets 30G MISC by Does not apply route. Use as directed twice a daily Dx  E11.65   [DISCONTINUED] ondansetron (ZOFRAN-ODT) 4 MG disintegrating tablet DISSOLVE 1 TABLET ON THE TONGUE EVERY 8 HOURS AS NEEDED FOR NAUSEA AND VOMITING (Patient not taking: Reported on 04/02/2021)   No facility-administered encounter medications on file as of 04/02/2021.    Surgical History: Past Surgical History:  Procedure Laterality Date   AMPUTATION TOE Left 02/25/2017   Procedure: AMPUTATION TOE-LEFT 2ND MPJ;  Surgeon: Samara Deist, DPM;  Location: [facility] ORS;  Service: Podiatry;  Laterality: Left;   AMPUTATION TOE Left 10/20/2018   Procedure: TOE MPJ T2 LEFT;  Surgeon:  Samara Deist, DPM;  Location: [facility];  Service: Podiatry;  Laterality: Left;   AMPUTATION TOE Left 01/12/2019   Procedure: AMPUTATION LEFT 5TH TOE AND JOINT;  Surgeon: Samara Deist, DPM;  Location: [facility];  Service: Podiatry;  Laterality: Left;   BACK SURGERY  2008   BONE BIOPSY Left 11/21/2020   Procedure: BONE BIOPSY;  Surgeon: Samara Deist, DPM;  Location: [facility];  Service: Podiatry;  Laterality: Left;   CHOLECYSTECTOMY     COLONOSCOPY WITH PROPOFOL N/A 08/15/2017   Procedure: COLONOSCOPY WITH PROPOFOL;  Surgeon: Lollie Sails, MD;  [facility];  Service: Endoscopy;  Laterality: N/A;   ENDARTERECTOMY Right 05/12/2017   Procedure: ENDARTERECTOMY CAROTID;  Surgeon: [name], MD;  Location: [facility];  Service: Vascular;  Laterality: Right;   ENDARTERECTOMY FEMORAL Left 12/01/2018   Procedure: ENDARTERECTOMY FEMORAL;  Surgeon: [name], MD;  Location: [facility];  Service: Vascular;  Laterality: Left;   ERCP     FOOT SURGERY Right    x 3   IRRIGATION AND DEBRIDEMENT FOOT Left 11/21/2020   Procedure: IRRIGATION AND DEBRIDEMENT FOOT--with placement of wound vac;  Surgeon: Samara Deist, DPM;  Location: [facility];  Service: Podiatry;  Laterality: Left;   LOWER EXTREMITY ANGIOGRAM Left 12/01/2018   Procedure: LOWER EXTREMITY ANGIOGRAM;  Surgeon: [name], MD;  Location: [facility];  Service: Vascular;  Laterality: Left;   LOWER EXTREMITY ANGIOGRAPHY Left 10/30/2018   Procedure: LOWER EXTREMITY ANGIOGRAPHY;  Surgeon: [name], MD;  Location: [place] [facility];  Service: Cardiovascular;  Laterality: Left;   LOWER EXTREMITY ANGIOGRAPHY Left 11/30/2018   Procedure: LOWER EXTREMITY ANGIOGRAPHY;  Surgeon: [name], MD;  Location: [place] [facility];  Service: Cardiovascular;  Laterality: Left;   LOWER EXTREMITY ANGIOGRAPHY Left 11/17/2020   Procedure: Lower Extremity Angiography;  Surgeon: [name], MD;  Location: [place] [facility];  Service: Cardiovascular;   Laterality: Left;   TEE WITHOUT CARDIOVERSION N/A 11/14/2020   Procedure: TRANSESOPHAGEAL ECHOCARDIOGRAM (TEE);  Surgeon: Corey Skains, MD;  Location: [facility];  Service: Cardiovascular;  Laterality: N/A;    Medical History: Past Medical History:  Diagnosis Date   Cholecystitis, chronic    Cholelithiasis    COPD (chronic obstructive pulmonary disease) ([place])    Diabetes mellitus without complication ([place])    Diabetic retinopathy ([place])    Dyspnea    Fatty liver 2008   Fracture of clavicle 1992   left    Fracture of ribs, multiple 1992   3 ribs   GERD (gastroesophageal reflux disease)    Hyperlipidemia    Hypertension    Paroxysmal SVT (supraventricular tachycardia) ([place])    Pelvis fracture ([place]) 1992   Peripheral vascular disease ([place])    Pleurisy    Pleurisy    Seasonal allergies     Family History: Family History  Problem Relation Age of Onset   Diabetes Sister  Social History   Socioeconomic History   Marital status: Single    Spouse name: Not on file   Number of children: Not on file   Years of education: Not on file   Highest education level: Not on file  Occupational History   Not on file  Tobacco Use   Smoking status: Former    Pack years: 0.00    Types: Cigarettes    Quit date: 02/24/1997    Years since quitting: 24.1   Smokeless tobacco: Never  Vaping Use   Vaping Use: Never used  Substance and Sexual Activity   Alcohol use: No   Drug use: No   Sexual activity: Not on file  Other Topics Concern   Not on file  Social History Narrative   Not on file   Social Determinants of Health   Financial Resource Strain: Low Risk    Difficulty of Paying Living Expenses: Not very hard  Food Insecurity: No Food Insecurity   Worried About Running Out of Food in the Last Year: Never true   [place] in the Last Year: Never true  Transportation Needs: No Transportation Needs   Lack of Transportation (Medical): No   Lack of Transportation  (Non-Medical): No  Physical Activity: Not on file  Stress: Not on file  Social Connections: Not on file  Intimate Partner Violence: Not on file      Review of Systems  Constitutional:  Negative for chills, fatigue and unexpected weight change.  HENT:  Negative for congestion, postnasal drip, rhinorrhea, sneezing and sore throat.   Eyes:  Negative for redness.  Respiratory:  Positive for shortness of breath and wheezing. Negative for cough and chest tightness.   Cardiovascular:  Negative for chest pain and palpitations.  Gastrointestinal:  Negative for abdominal pain, constipation, diarrhea, nausea and vomiting.  Genitourinary:  Negative for dysuria and frequency.  Musculoskeletal:  Positive for gait problem. Negative for arthralgias, back pain, joint swelling and neck pain.  Skin:  Negative for rash.  Neurological:  Negative for tremors and numbness.  Hematological:  Negative for adenopathy. Does not bruise/bleed easily.  Psychiatric/Behavioral:  Negative for behavioral problems (Depression), sleep disturbance and suicidal ideas. The patient is not nervous/anxious.    Vital Signs: BP 112/70   Pulse 76   Temp (!) 97.1 F (36.2 C)   Resp 16   Ht $R'5\' 10"'An$  (1.778 m)   Wt 160 lb 3.2 oz (72.7 kg)   SpO2 98%   BMI 22.99 kg/m    Physical Exam Vitals and nursing note reviewed.  Constitutional:      General: He is not in acute distress.    Appearance: He is well-developed and normal weight. He is not diaphoretic.  HENT:     Head: Normocephalic and atraumatic.     Mouth/Throat:     Pharynx: No oropharyngeal exudate.  Eyes:     Pupils: Pupils are equal, round, and reactive to light.  Neck:     Thyroid: No thyromegaly.     Vascular: No JVD.     Trachea: No tracheal deviation.  Cardiovascular:     Rate and Rhythm: Normal rate and regular rhythm.     Heart sounds: Normal heart sounds. No murmur heard.   No friction rub. No gallop.  Pulmonary:     Effort: Pulmonary effort is  normal. No respiratory distress.     Breath sounds: No wheezing or rales.  Chest:     Chest wall: No tenderness.  Abdominal:  General: Bowel sounds are normal.     Palpations: Abdomen is soft.  Musculoskeletal:        General: Normal range of motion.     Cervical back: Normal range of motion and neck supple.  Lymphadenopathy:     Cervical: No cervical adenopathy.  Skin:    General: Skin is warm and dry.  Neurological:     Mental Status: He is alert and oriented to person, place, and time.     Cranial Nerves: No cranial nerve deficit.     Gait: Gait abnormal.  Psychiatric:        Behavior: Behavior normal.        Thought Content: Thought content normal.        Judgment: Judgment normal.       Assessment/Plan: 1. Essential hypertension Stable, continue current and f/u with cardiology as indicated  2. Type 2 diabetes mellitus with hyperglycemia, without long-term current use of insulin (HCC) A1c next visit, continue current medications while watching diet. Followed by podiatry for healing ulcer and plans to discuss diabetic shoes next visit.  3. COPD with hypoxia ([place]) Continue inhalers as prescribed--pharmacist looking into cost reduction for Dulera. Continue with oxygen use at night and as needed during the day.  4. Gastroesophageal reflux disease without esophagitis UGI showed mod GERD will start on protonix - pantoprazole (PROTONIX) 40 MG tablet; Take 1 tablet (40 mg total) by mouth daily.  Dispense: 90 tablet; Refill: 2  5. PAD (peripheral artery disease) (HCC) Followed by vascular  6. Mixed hyperlipidemia Continue lovastatin   General Counseling: Dayln verbalizes understanding of the findings of todays visit and agrees with plan of treatment. I have discussed any further diagnostic evaluation that may be needed or ordered today. We also reviewed his medications today. he has been encouraged to call the office with any questions or concerns that should arise  related to todays visit.    No orders of the defined types were placed in this encounter.   Meds ordered this encounter  Medications   pantoprazole (PROTONIX) 40 MG tablet    Sig: Take 1 tablet (40 mg total) by mouth daily.    Dispense:  90 tablet    Refill:  2   Continuous Blood Gluc Transmit (DEXCOM G6 TRANSMITTER) MISC    Sig: Use as directed every 14 days to monitor blood sugars    Dispense:  3 each    Refill:  3   Continuous Blood Gluc Sensor (DEXCOM G6 SENSOR) MISC    Sig: Use as directed every 14 days to monitor blood sugars    Dispense:  3 each    Refill:  3   Continuous Blood Gluc Receiver (DEXCOM G6 RECEIVER) DEVI    Sig: Use as directed every 14 days to monitor blood sugars    Dispense:  3 each    Refill:  3    This patient was seen by Drema Dallas, PA-C in collaboration with Dr. Clayborn Bigness as a part of collaborative care agreement.   Total time spent:30 Minutes Time spent includes review of chart, medications, test results, and follow up plan with the patient.      Dr [name] Internal medicine

## 2021-04-03 DIAGNOSIS — J439 Emphysema, unspecified: Secondary | ICD-10-CM | POA: Diagnosis not present

## 2021-04-07 NOTE — Patient Instructions (Signed)

## 2021-04-09 ENCOUNTER — Telehealth: Payer: Self-pay | Admitting: Pharmacist

## 2021-04-09 NOTE — Progress Notes (Addendum)
    Chronic Care Management Pharmacy Assistant   Name: [name]  MRN: [id number] DOB: 05-24-1951  Reason for Encounter: PAP  Medications: Outpatient Encounter Medications as of 04/09/2021  Medication Sig   Alcohol Swabs (B-D SINGLE USE SWABS REGULAR) PADS by Does not apply route. Use as directed   aspirin EC 81 MG tablet Take 81 mg by mouth daily.   Blood Glucose Calibration (TRUE METRIX LEVEL 1) Low SOLN Use as directed when needed dx E11.65   Blood Glucose Monitoring Suppl (TRUE METRIX GO GLUCOSE METER) w/Device KIT Use as directed to check glucose DX e11.65   Calcium Carbonate-Vitamin D (CALCIUM PLUS VITAMIN D PO) Take 1 tablet by mouth daily.   clopidogrel (PLAVIX) 75 MG tablet Take 1 tablet (75 mg total) by mouth daily.   Continuous Blood Gluc Receiver (DEXCOM G6 RECEIVER) DEVI Use as directed every 14 days to monitor blood sugars   Continuous Blood Gluc Sensor (DEXCOM G6 SENSOR) MISC Use as directed every 14 days to monitor blood sugars   Continuous Blood Gluc Transmit (DEXCOM G6 TRANSMITTER) MISC Use as directed every 14 days to monitor blood sugars   cyclobenzaprine (FLEXERIL) 10 MG tablet Take 1 tablet (10 mg total) by mouth 3 (three) times daily as needed for muscle spasms.   gabapentin (NEURONTIN) 300 MG capsule Take 1 capsule (300 mg total) by mouth 3 (three) times daily.   glucose blood (TRUE METRIX BLOOD GLUCOSE TEST) test strip Use as directed twice dailyDx E11.65   glyBURIDE (DIABETA) 5 MG tablet Take 1 tablet (5 mg total) by mouth 2 (two) times daily with a meal.   Lancets (ACCU-CHEK SOFT TOUCH) lancets Use as instructed   lovastatin (MEVACOR) 10 MG tablet Take 1 tab  Po QHS   meloxicam (MOBIC) 15 MG tablet Take by mouth.   metoprolol succinate (TOPROL-XL) 50 MG 24 hr tablet Take 1 tablet (50 mg total) by mouth daily. Take with or immediately following a meal.   mometasone-formoterol (DULERA) 100-5 MCG/ACT AERO Inhale 2 puffs into the lungs 2 (two) times daily as  needed for wheezing.   pantoprazole (PROTONIX) 40 MG tablet Take 1 tablet (40 mg total) by mouth daily.   traMADol (ULTRAM) 50 MG tablet TAKE 1 TABLET BY MOUTH EVERY 12 HOURS ASNEEDED FOR PAIN   TRUEplus Lancets 30G MISC by Does not apply route. Use as directed twice a daily Dx E11.65   No facility-administered encounter medications on file as of 04/09/2021.   New patient assistance application form filled out to [place] for [facility]. Called patient and spoke to him about the application and we did everything over the phone. Explained to the patient that everything was completed and submitted and for him to be on the look out for a call from the company , the patient voiced understanding.  Follow-Up:Pharmacist Review  Charlann Lange, [place] Pharmacist Assistant [phone]

## 2021-04-20 ENCOUNTER — Emergency Department: Payer: Medicare HMO

## 2021-04-20 ENCOUNTER — Encounter: Payer: Self-pay | Admitting: Emergency Medicine

## 2021-04-20 ENCOUNTER — Other Ambulatory Visit: Payer: Self-pay

## 2021-04-20 ENCOUNTER — Inpatient Hospital Stay
Admission: EM | Admit: 2021-04-20 | Discharge: 2021-04-28 | DRG: 617 | Disposition: A | Discharge status: Skilled Nursing Facility | Payer: Medicare HMO | Attending: Internal Medicine | Admitting: Internal Medicine

## 2021-04-20 DIAGNOSIS — I701 Atherosclerosis of renal artery: Secondary | ICD-10-CM | POA: Diagnosis present

## 2021-04-20 DIAGNOSIS — R600 Localized edema: Secondary | ICD-10-CM | POA: Diagnosis not present

## 2021-04-20 DIAGNOSIS — M19011 Primary osteoarthritis, right shoulder: Secondary | ICD-10-CM

## 2021-04-20 DIAGNOSIS — D62 Acute posthemorrhagic anemia: Secondary | ICD-10-CM | POA: Diagnosis not present

## 2021-04-20 DIAGNOSIS — E1169 Type 2 diabetes mellitus with other specified complication: Principal | ICD-10-CM | POA: Diagnosis present

## 2021-04-20 DIAGNOSIS — L97423 Non-pressure chronic ulcer of left heel and midfoot with necrosis of muscle: Secondary | ICD-10-CM | POA: Diagnosis not present

## 2021-04-20 DIAGNOSIS — E785 Hyperlipidemia, unspecified: Secondary | ICD-10-CM | POA: Diagnosis present

## 2021-04-20 DIAGNOSIS — I739 Peripheral vascular disease, unspecified: Secondary | ICD-10-CM | POA: Diagnosis present

## 2021-04-20 DIAGNOSIS — I70248 Atherosclerosis of native arteries of left leg with ulceration of other part of lower left leg: Secondary | ICD-10-CM | POA: Diagnosis not present

## 2021-04-20 DIAGNOSIS — Z9049 Acquired absence of other specified parts of digestive tract: Secondary | ICD-10-CM

## 2021-04-20 DIAGNOSIS — D638 Anemia in other chronic diseases classified elsewhere: Secondary | ICD-10-CM | POA: Diagnosis present

## 2021-04-20 DIAGNOSIS — M86172 Other acute osteomyelitis, left ankle and foot: Secondary | ICD-10-CM | POA: Diagnosis not present

## 2021-04-20 DIAGNOSIS — Z23 Encounter for immunization: Secondary | ICD-10-CM

## 2021-04-20 DIAGNOSIS — E1152 Type 2 diabetes mellitus with diabetic peripheral angiopathy with gangrene: Secondary | ICD-10-CM | POA: Diagnosis not present

## 2021-04-20 DIAGNOSIS — M86672 Other chronic osteomyelitis, left ankle and foot: Secondary | ICD-10-CM | POA: Diagnosis not present

## 2021-04-20 DIAGNOSIS — Z7982 Long term (current) use of aspirin: Secondary | ICD-10-CM

## 2021-04-20 DIAGNOSIS — R5381 Other malaise: Secondary | ICD-10-CM | POA: Diagnosis not present

## 2021-04-20 DIAGNOSIS — D649 Anemia, unspecified: Secondary | ICD-10-CM | POA: Diagnosis not present

## 2021-04-20 DIAGNOSIS — Z7984 Long term (current) use of oral hypoglycemic drugs: Secondary | ICD-10-CM

## 2021-04-20 DIAGNOSIS — J449 Chronic obstructive pulmonary disease, unspecified: Secondary | ICD-10-CM | POA: Diagnosis not present

## 2021-04-20 DIAGNOSIS — K760 Fatty (change of) liver, not elsewhere classified: Secondary | ICD-10-CM | POA: Diagnosis present

## 2021-04-20 DIAGNOSIS — I10 Essential (primary) hypertension: Secondary | ICD-10-CM | POA: Diagnosis not present

## 2021-04-20 DIAGNOSIS — I70262 Atherosclerosis of native arteries of extremities with gangrene, left leg: Secondary | ICD-10-CM | POA: Diagnosis present

## 2021-04-20 DIAGNOSIS — M6281 Muscle weakness (generalized): Secondary | ICD-10-CM | POA: Diagnosis not present

## 2021-04-20 DIAGNOSIS — Z833 Family history of diabetes mellitus: Secondary | ICD-10-CM

## 2021-04-20 DIAGNOSIS — Z741 Need for assistance with personal care: Secondary | ICD-10-CM | POA: Diagnosis not present

## 2021-04-20 DIAGNOSIS — E1140 Type 2 diabetes mellitus with diabetic neuropathy, unspecified: Secondary | ICD-10-CM | POA: Diagnosis present

## 2021-04-20 DIAGNOSIS — E119 Type 2 diabetes mellitus without complications: Secondary | ICD-10-CM | POA: Diagnosis not present

## 2021-04-20 DIAGNOSIS — L97429 Non-pressure chronic ulcer of left heel and midfoot with unspecified severity: Secondary | ICD-10-CM | POA: Diagnosis present

## 2021-04-20 DIAGNOSIS — M869 Osteomyelitis, unspecified: Secondary | ICD-10-CM | POA: Diagnosis not present

## 2021-04-20 DIAGNOSIS — K219 Gastro-esophageal reflux disease without esophagitis: Secondary | ICD-10-CM | POA: Diagnosis present

## 2021-04-20 DIAGNOSIS — Z89422 Acquired absence of other left toe(s): Secondary | ICD-10-CM

## 2021-04-20 DIAGNOSIS — R7989 Other specified abnormal findings of blood chemistry: Secondary | ICD-10-CM | POA: Diagnosis present

## 2021-04-20 DIAGNOSIS — Z9981 Dependence on supplemental oxygen: Secondary | ICD-10-CM

## 2021-04-20 DIAGNOSIS — E11628 Type 2 diabetes mellitus with other skin complications: Secondary | ICD-10-CM | POA: Diagnosis present

## 2021-04-20 DIAGNOSIS — E11621 Type 2 diabetes mellitus with foot ulcer: Secondary | ICD-10-CM | POA: Diagnosis present

## 2021-04-20 DIAGNOSIS — Z7902 Long term (current) use of antithrombotics/antiplatelets: Secondary | ICD-10-CM

## 2021-04-20 DIAGNOSIS — Z880 Allergy status to penicillin: Secondary | ICD-10-CM | POA: Diagnosis not present

## 2021-04-20 DIAGNOSIS — Z87891 Personal history of nicotine dependence: Secondary | ICD-10-CM

## 2021-04-20 DIAGNOSIS — E11319 Type 2 diabetes mellitus with unspecified diabetic retinopathy without macular edema: Secondary | ICD-10-CM | POA: Diagnosis present

## 2021-04-20 DIAGNOSIS — E871 Hypo-osmolality and hyponatremia: Secondary | ICD-10-CM | POA: Diagnosis not present

## 2021-04-20 DIAGNOSIS — M7989 Other specified soft tissue disorders: Secondary | ICD-10-CM | POA: Diagnosis not present

## 2021-04-20 DIAGNOSIS — E1165 Type 2 diabetes mellitus with hyperglycemia: Secondary | ICD-10-CM | POA: Diagnosis not present

## 2021-04-20 DIAGNOSIS — Z20822 Contact with and (suspected) exposure to covid-19: Secondary | ICD-10-CM | POA: Diagnosis present

## 2021-04-20 DIAGNOSIS — Z882 Allergy status to sulfonamides status: Secondary | ICD-10-CM

## 2021-04-20 DIAGNOSIS — M1990 Unspecified osteoarthritis, unspecified site: Secondary | ICD-10-CM | POA: Diagnosis present

## 2021-04-20 DIAGNOSIS — R279 Unspecified lack of coordination: Secondary | ICD-10-CM | POA: Diagnosis not present

## 2021-04-20 DIAGNOSIS — Z79899 Other long term (current) drug therapy: Secondary | ICD-10-CM

## 2021-04-20 DIAGNOSIS — Z791 Long term (current) use of non-steroidal anti-inflammatories (NSAID): Secondary | ICD-10-CM

## 2021-04-20 DIAGNOSIS — Z89512 Acquired absence of left leg below knee: Secondary | ICD-10-CM | POA: Diagnosis not present

## 2021-04-20 DIAGNOSIS — L98499 Non-pressure chronic ulcer of skin of other sites with unspecified severity: Secondary | ICD-10-CM | POA: Diagnosis not present

## 2021-04-20 DIAGNOSIS — Z4781 Encounter for orthopedic aftercare following surgical amputation: Secondary | ICD-10-CM | POA: Diagnosis not present

## 2021-04-20 DIAGNOSIS — L089 Local infection of the skin and subcutaneous tissue, unspecified: Secondary | ICD-10-CM

## 2021-04-20 LAB — CBC WITH DIFFERENTIAL/PLATELET
Abs Immature Granulocytes: 0.02 10*3/uL (ref 0.00–0.07)
Basophils Absolute: 0 10*3/uL (ref 0.0–0.1)
Basophils Relative: 0 %
Eosinophils Absolute: 0.1 10*3/uL (ref 0.0–0.5)
Eosinophils Relative: 1 %
HCT: 29.6 % — ABNORMAL LOW (ref 39.0–52.0)
Hemoglobin: 9.6 g/dL — ABNORMAL LOW (ref 13.0–17.0)
Immature Granulocytes: 0 %
Lymphocytes Relative: 14 %
Lymphs Abs: 1 10*3/uL (ref 0.7–4.0)
MCH: 25.9 pg — ABNORMAL LOW (ref 26.0–34.0)
MCHC: 32.4 g/dL (ref 30.0–36.0)
MCV: 80 fL (ref 80.0–100.0)
Monocytes Absolute: 0.5 10*3/uL (ref 0.1–1.0)
Monocytes Relative: 6 %
Neutro Abs: 5.8 10*3/uL (ref 1.7–7.7)
Neutrophils Relative %: 79 %
Platelets: 232 10*3/uL (ref 150–400)
RBC: 3.7 MIL/uL — ABNORMAL LOW (ref 4.22–5.81)
RDW: 15.2 % (ref 11.5–15.5)
WBC: 7.5 10*3/uL (ref 4.0–10.5)
nRBC: 0 % (ref 0.0–0.2)

## 2021-04-20 LAB — COMPREHENSIVE METABOLIC PANEL
ALT: 14 U/L (ref 0–44)
AST: 17 U/L (ref 15–41)
Albumin: 2.7 g/dL — ABNORMAL LOW (ref 3.5–5.0)
Alkaline Phosphatase: 76 U/L (ref 38–126)
Anion gap: 9 (ref 5–15)
BUN: 21 mg/dL (ref 8–23)
CO2: 25 mmol/L (ref 22–32)
Calcium: 8.6 mg/dL — ABNORMAL LOW (ref 8.9–10.3)
Chloride: 96 mmol/L — ABNORMAL LOW (ref 98–111)
Creatinine, Ser: 1.04 mg/dL (ref 0.61–1.24)
GFR, Estimated: >60 mL/min (ref >60)
Glucose, Bld: 343 mg/dL — ABNORMAL HIGH (ref 70–99)
Potassium: 4.9 mmol/L (ref 3.5–5.1)
Sodium: 130 mmol/L — ABNORMAL LOW (ref 135–145)
Total Bilirubin: 0.6 mg/dL (ref 0.3–1.2)
Total Protein: 7.1 g/dL (ref 6.5–8.1)

## 2021-04-20 LAB — LACTIC ACID, PLASMA
Lactic Acid, Venous: 2.2 mmol/L (ref 0.5–1.9)
Lactic Acid, Venous: 1.6 mmol/L (ref 0.5–1.9)

## 2021-04-20 IMAGING — DX DG FOOT COMPLETE 3+V*L*
3 series · 3 of 3 positions shown · non-contrast
Comparison: [DATE]

CLINICAL DATA: Heel ulcer

EXAM:
LEFT FOOT - COMPLETE 3+ VIEW

[foot lat]
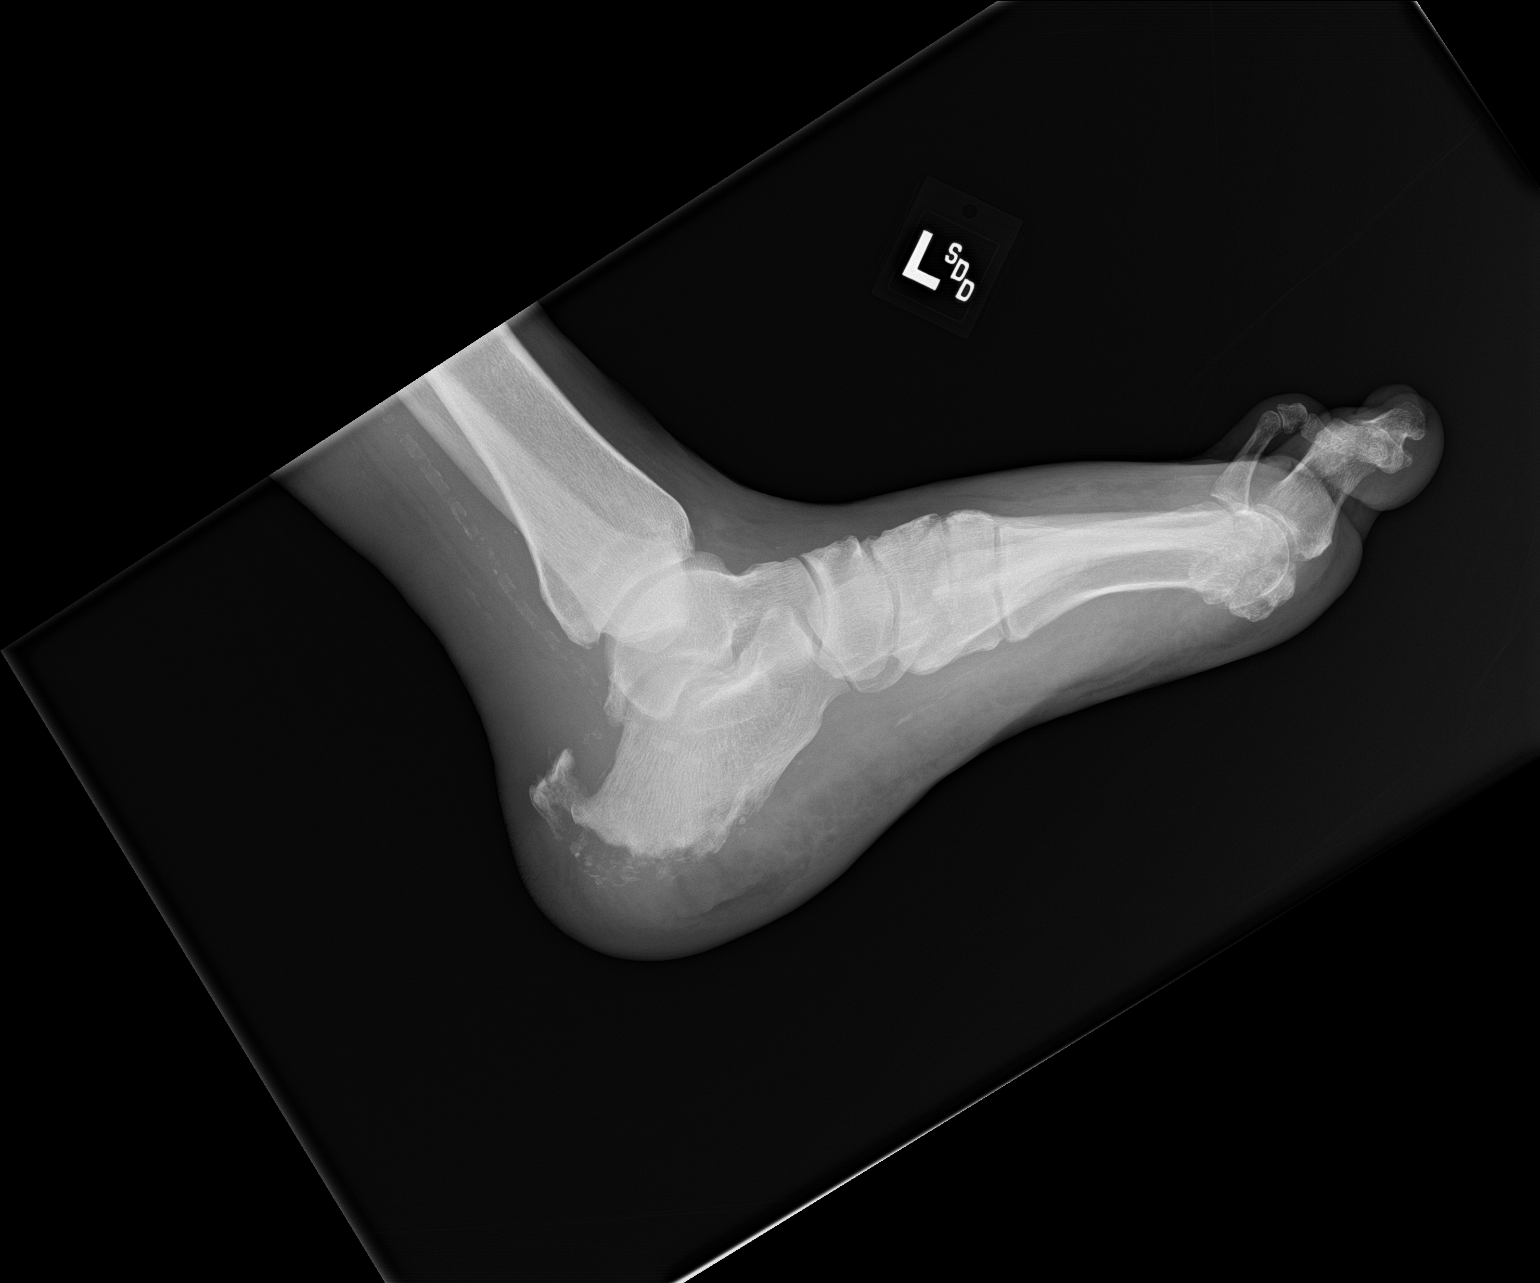

[foot ap]
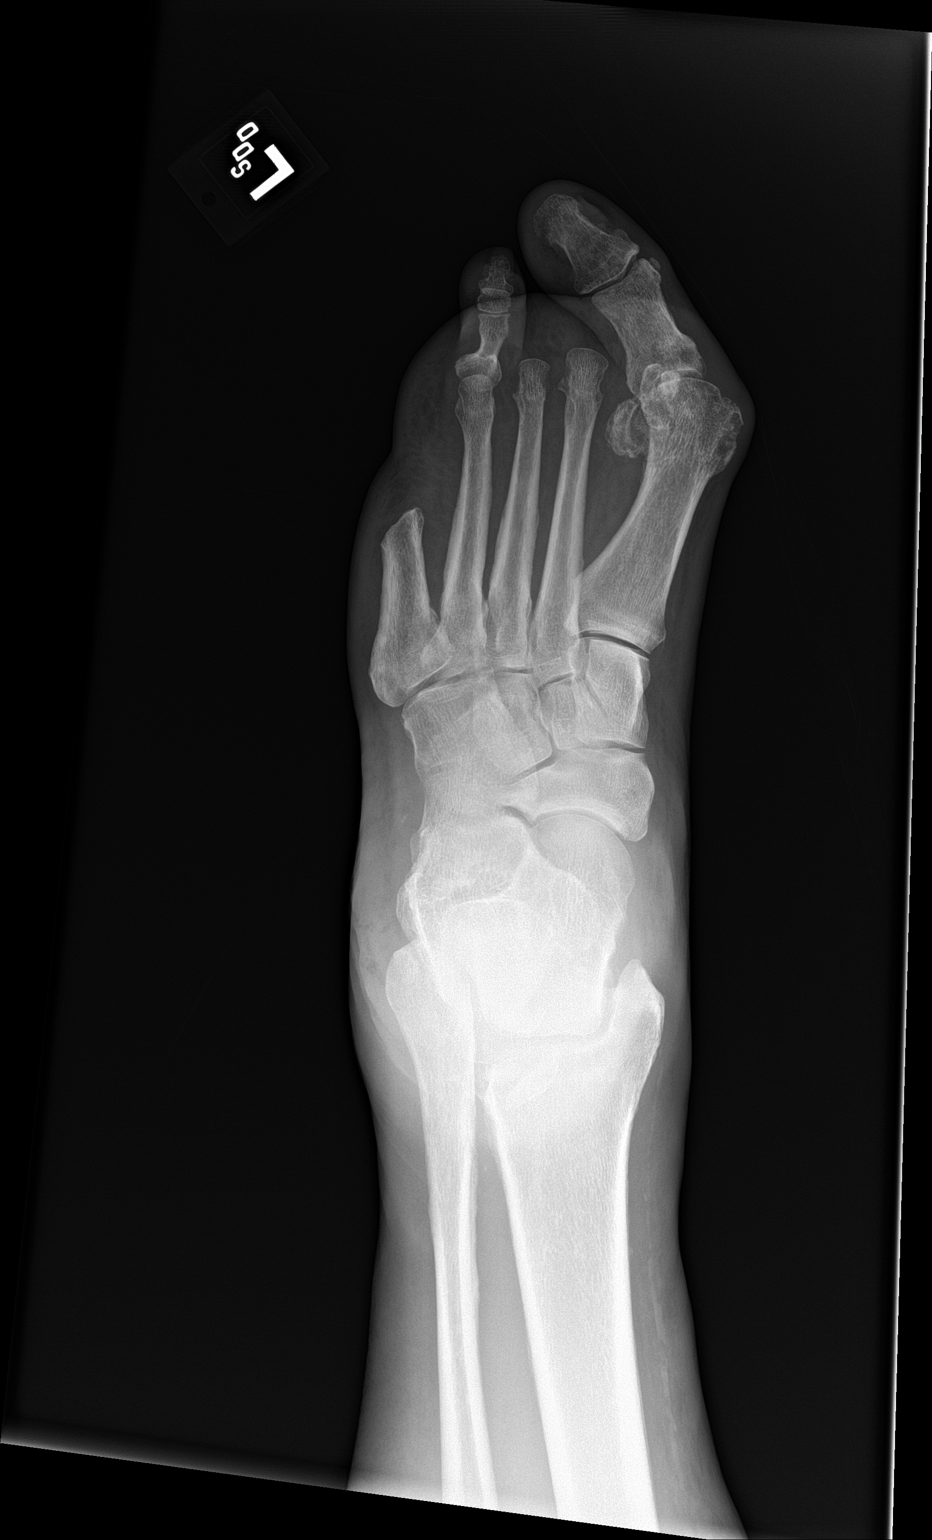

[foot obl]
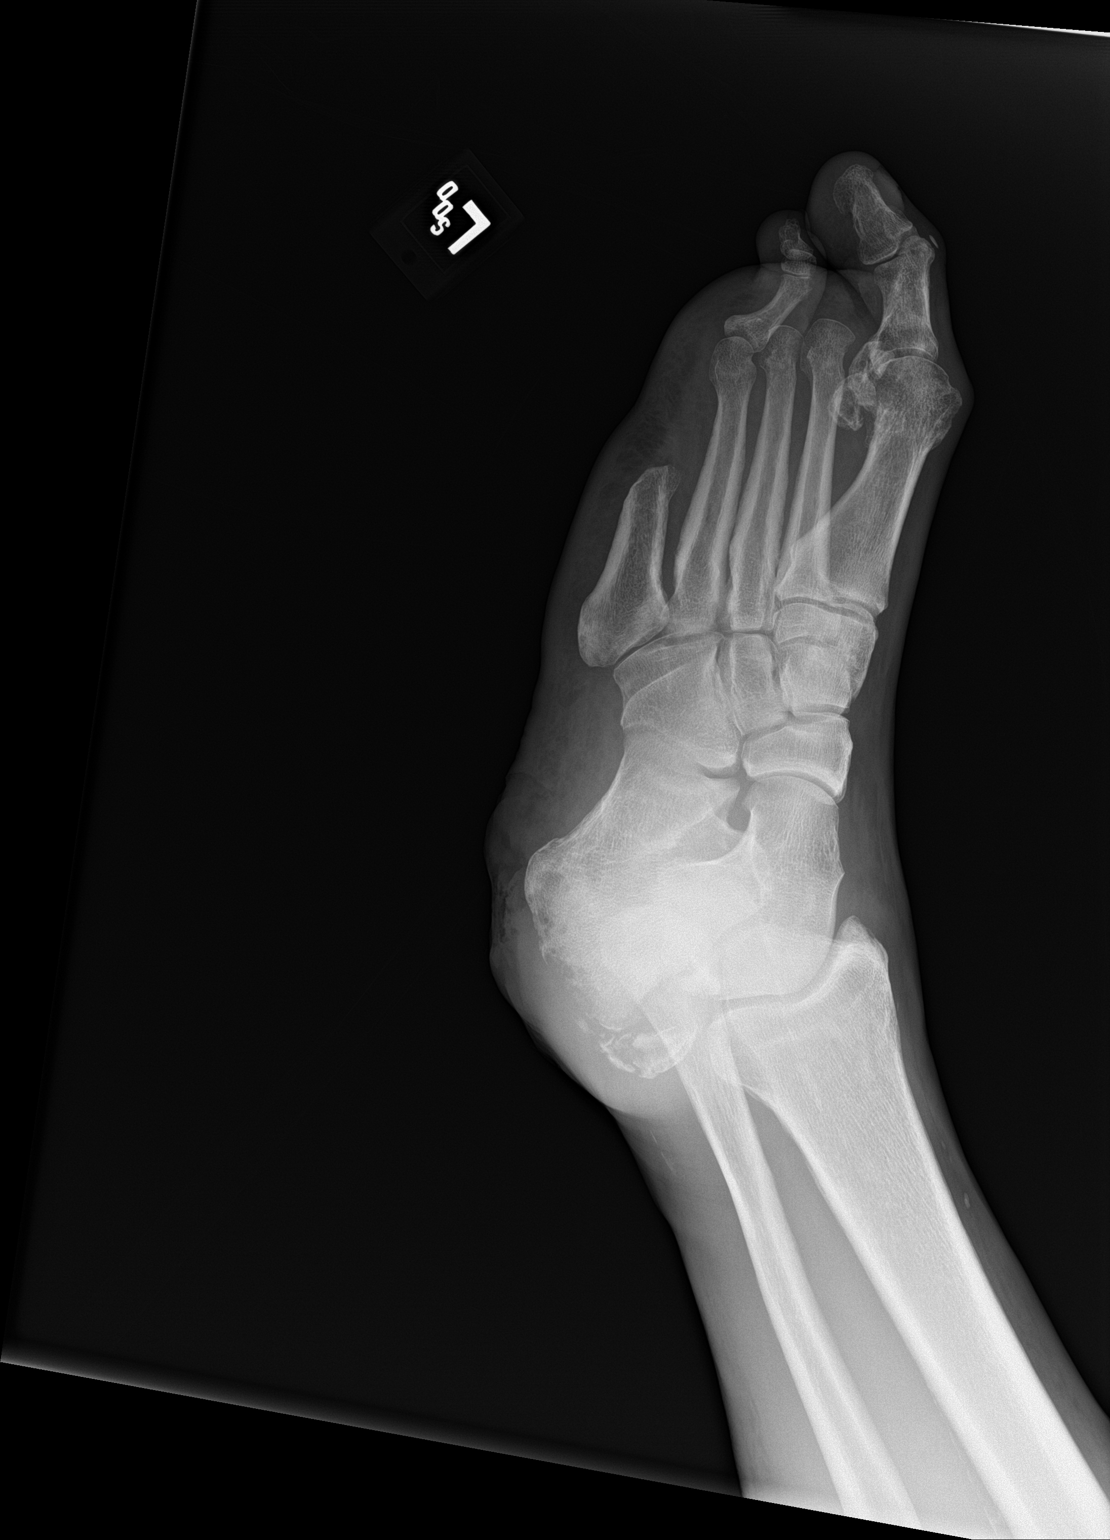

[3 of 3 positions shown; findings below may reference images not displayed]

FINDINGS: There is diffuse soft tissue swelling of the foot with lucency along
the heel suggestive of heel ulcer. There is new bony destruction of
the posterior calcaneus. Vascular calcifications. Prior second and
third toe amputations and fifth ray amputation to the level of the
mid metatarsal. Severe hallux valgus with bunion formation.
IMPRESSION: Osteomyelitis of the posterior calcaneus with significant bony
destruction. Adjacent soft tissue ulcer. Diffuse soft tissue
swelling.

## 2021-04-20 MED ORDER — MORPHINE SULFATE (PF) 2 MG/ML IV SOLN
2.0000 mg | INTRAVENOUS | Status: DC | PRN
  Administered 2021-04-21 – 2021-04-24 (×4): 2 mg via INTRAVENOUS
  Filled 2021-04-20 (×5): qty 1

## 2021-04-20 MED ORDER — ONDANSETRON HCL 4 MG/2ML IJ SOLN: 4.0000 mg | Freq: Four times a day (QID) | INTRAMUSCULAR | Status: DC | PRN

## 2021-04-20 MED ORDER — VANCOMYCIN HCL 1500 MG/300ML IV SOLN
1500.0000 mg | INTRAVENOUS | Status: DC
  Administered 2021-04-21: 1500 mg via INTRAVENOUS
  Filled 2021-04-20 (×4): qty 300

## 2021-04-20 MED ORDER — HYDROCODONE-ACETAMINOPHEN 5-325 MG PO TABS
1.0000 | ORAL_TABLET | ORAL | Status: DC | PRN
  Administered 2021-04-24 (×3): 2 via ORAL
  Administered 2021-04-24: 1 via ORAL
  Administered 2021-04-25 – 2021-04-28 (×8): 2 via ORAL
  Filled 2021-04-20 (×7): qty 2
  Filled 2021-04-20: qty 1
  Filled 2021-04-20 (×2): qty 2
  Filled 2021-04-20: qty 1
  Filled 2021-04-20 (×3): qty 2

## 2021-04-20 MED ORDER — INSULIN ASPART 100 UNIT/ML IJ SOLN
0.0000 [IU] | INTRAMUSCULAR | Status: DC
  Administered 2021-04-21: 7 [IU] via SUBCUTANEOUS
  Filled 2021-04-20: qty 1

## 2021-04-20 MED ORDER — LACTATED RINGERS IV SOLN: INTRAVENOUS | Status: DC

## 2021-04-20 MED ORDER — ONDANSETRON HCL 4 MG PO TABS: 4.0000 mg | ORAL_TABLET | Freq: Four times a day (QID) | ORAL | Status: DC | PRN

## 2021-04-20 MED ORDER — ACETAMINOPHEN 650 MG RE SUPP: 650.0000 mg | Freq: Four times a day (QID) | RECTAL | Status: DC | PRN

## 2021-04-20 MED ORDER — FENTANYL CITRATE (PF) 100 MCG/2ML IJ SOLN
50.0000 ug | Freq: Once | INTRAMUSCULAR | Status: AC
  Administered 2021-04-20: 50 ug via INTRAVENOUS
  Filled 2021-04-20: qty 2

## 2021-04-20 MED ORDER — VANCOMYCIN HCL 500 MG/100ML IV SOLN
500.0000 mg | Freq: Once | INTRAVENOUS | Status: AC
  Administered 2021-04-21: 500 mg via INTRAVENOUS
  Filled 2021-04-20: qty 100

## 2021-04-20 MED ORDER — VANCOMYCIN HCL IN DEXTROSE 1-5 GM/200ML-% IV SOLN
1000.0000 mg | Freq: Once | INTRAVENOUS | Status: AC
  Administered 2021-04-20: 1000 mg via INTRAVENOUS
  Filled 2021-04-20: qty 200

## 2021-04-20 MED ORDER — ACETAMINOPHEN 325 MG PO TABS: 650.0000 mg | ORAL_TABLET | Freq: Four times a day (QID) | ORAL | Status: DC | PRN

## 2021-04-20 NOTE — ED Notes (Signed)
Pt requested 1L [place] for comfort. O2 sats on RA were 96%, Improved to 98% on 1L.

## 2021-04-20 NOTE — ED Triage Notes (Addendum)
Pt comes into the ED via POV c/o diabetic wound and possible infection.  PT's wound is on the left foot.  Pt states he has only had it a little over a week.  Per the patient, the wound goes tot he bone and is swollen.  Pt was sent over by the podiatrist. Strong odor present at this time.

## 2021-04-20 NOTE — ED Provider Notes (Signed)
[facility] Emergency Department Provider Note  ____________________________________________   I have reviewed the triage vital signs and the nursing notes.   HISTORY  Chief Complaint Wound Infection   History limited by: Not limited   HPI [name] is a 70 y.o. male who presents to the emergency department today because of concern for wound to his left heel. He states he first noticed it roughly 1 week ago.  The patient states that initially it looked okay although as the days went on it continue to look worse.  He went to his podiatrist earlier today who recommended that he present to the emergency department.  Patient states that the wound is associated with significant pain.  He rated it a 10 out of 10.  He denies any fevers, nausea or vomiting.  States he has had foot wounds in the past.  Does have history of diabetes.  Records reviewed. Per medical record review patient has a history of DM  Past Medical History:  Diagnosis Date   Cholecystitis, chronic    Cholelithiasis    COPD (chronic obstructive pulmonary disease) ([place])    Diabetes mellitus without complication ([place])    Diabetic retinopathy ([place])    Dyspnea    Fatty liver 2008   Fracture of clavicle 1992   left    Fracture of ribs, multiple 1992   3 ribs   GERD (gastroesophageal reflux disease)    Hyperlipidemia    Hypertension    Paroxysmal SVT (supraventricular tachycardia) ([place])    Pelvis fracture ([place]) 1992   Peripheral vascular disease ([place])    Pleurisy    Pleurisy    Seasonal allergies     Patient Active Problem List   Diagnosis Date Noted   Bacteremia due to Enterococcus    Bacteremia due to methicillin susceptible Staphylococcus aureus (MSSA)    Diabetic foot infection ([place])    Sepsis ([place]) 11/11/2020   Diabetic foot ulcer ([place]) 11/11/2020   Moderate asthma without complication 35/59/7416   Bilateral impacted cerumen 08/09/2020   Acute otitis externa of both ears  08/09/2020   Primary generalized (osteo)arthritis 03/20/2020   Dysuria 06/15/2019   Atherosclerosis of artery of extremity with ulceration ([place]) 11/30/2018   Need for vaccination against Streptococcus pneumoniae using pneumococcal conjugate vaccine 13 10/26/2018   Overactive bladder 10/26/2018   Atherosclerosis of native arteries of the extremities with gangrene ([place]) 10/25/2018   Sore throat 04/03/2018   Oral candidiasis 04/03/2018   Uncontrolled type 2 diabetes mellitus with hyperglycemia ([place]) 03/06/2018   Acute non-recurrent pansinusitis 03/06/2018   Primary osteoarthritis of shoulders, bilateral 03/06/2018   GERD (gastroesophageal reflux disease) 11/03/2017   Carotid stenosis, right 05/12/2017   Encounter for general adult medical examination with abnormal findings 03/25/2017   PAD (peripheral artery disease) ([place]) 03/15/2017   Bilateral carotid artery stenosis 03/15/2017   Cardiomyopathy, nonischemic ([place]) 07/28/2015   Chest pain with low risk for cardiac etiology 12/04/2014   Paroxysmal SVT (supraventricular tachycardia) ([place]) 06/13/2014   Diabetes ([place]) 06/13/2014   Mixed hyperlipidemia 06/13/2014   Essential hypertension 06/13/2014   Arthritis 06/13/2014   H/O seasonal allergies 06/13/2014    Past Surgical History:  Procedure Laterality Date   AMPUTATION TOE Left 02/25/2017   Procedure: AMPUTATION TOE-LEFT 2ND MPJ;  Surgeon: Samara Deist, DPM;  Location: [facility] ORS;  Service: Podiatry;  Laterality: Left;   AMPUTATION TOE Left 10/20/2018   Procedure: TOE MPJ T2 LEFT;  Surgeon: Samara Deist, DPM;  Location: [facility] ORS;  Service: Podiatry;  Laterality: Left;   AMPUTATION TOE Left 01/12/2019   Procedure: AMPUTATION LEFT 5TH TOE AND JOINT;  Surgeon: Samara Deist, DPM;  Location: [facility] ORS;  Service: Podiatry;  Laterality: Left;   BACK SURGERY  2008   BONE BIOPSY Left 11/21/2020   Procedure: BONE BIOPSY;  Surgeon: Samara Deist, DPM;  Location: [facility] ORS;  Service: Podiatry;   Laterality: Left;   CHOLECYSTECTOMY     COLONOSCOPY WITH PROPOFOL N/A 08/15/2017   Procedure: COLONOSCOPY WITH PROPOFOL;  Surgeon: Lollie Sails, MD;  Location: [facility] ENDOSCOPY;  Service: Endoscopy;  Laterality: N/A;   ENDARTERECTOMY Right 05/12/2017   Procedure: ENDARTERECTOMY CAROTID;  Surgeon: [name], MD;  Location: [facility] ORS;  Service: Vascular;  Laterality: Right;   ENDARTERECTOMY FEMORAL Left 12/01/2018   Procedure: ENDARTERECTOMY FEMORAL;  Surgeon: [name], MD;  Location: [facility] ORS;  Service: Vascular;  Laterality: Left;   ERCP     FOOT SURGERY Right    x 3   IRRIGATION AND DEBRIDEMENT FOOT Left 11/21/2020   Procedure: IRRIGATION AND DEBRIDEMENT FOOT--with placement of wound vac;  Surgeon: Samara Deist, DPM;  Location: [facility] ORS;  Service: Podiatry;  Laterality: Left;   LOWER EXTREMITY ANGIOGRAM Left 12/01/2018   Procedure: LOWER EXTREMITY ANGIOGRAM;  Surgeon: [name], MD;  Location: [facility] ORS;  Service: Vascular;  Laterality: Left;   LOWER EXTREMITY ANGIOGRAPHY Left 10/30/2018   Procedure: LOWER EXTREMITY ANGIOGRAPHY;  Surgeon: [name], MD;  Location: [place] CV LAB;  Service: Cardiovascular;  Laterality: Left;   LOWER EXTREMITY ANGIOGRAPHY Left 11/30/2018   Procedure: LOWER EXTREMITY ANGIOGRAPHY;  Surgeon: [name], MD;  Location: [place] CV LAB;  Service: Cardiovascular;  Laterality: Left;   LOWER EXTREMITY ANGIOGRAPHY Left 11/17/2020   Procedure: Lower Extremity Angiography;  Surgeon: [name], MD;  Location: [place] CV LAB;  Service: Cardiovascular;  Laterality: Left;   TEE WITHOUT CARDIOVERSION N/A 11/14/2020   Procedure: TRANSESOPHAGEAL ECHOCARDIOGRAM (TEE);  Surgeon: Corey Skains, MD;  Location: [facility] ORS;  Service: Cardiovascular;  Laterality: N/A;    Prior to Admission medications   Medication Sig Start Date End Date Taking? Authorizing Provider  Alcohol Swabs (B-D SINGLE USE SWABS REGULAR) PADS by Does not apply route. Use as  directed    [provider]  aspirin EC 81 MG tablet Take 81 mg by mouth daily.    [provider]  Blood Glucose Calibration (TRUE METRIX LEVEL 1) Low SOLN Use as directed when needed dx E11.65 03/17/21   [name], MD  Blood Glucose Monitoring Suppl (TRUE METRIX GO GLUCOSE METER) w/Device KIT Use as directed to check glucose DX e11.65 03/17/21   [name], MD  Calcium Carbonate-Vitamin D (CALCIUM PLUS VITAMIN D PO) Take 1 tablet by mouth daily.    [provider]  clopidogrel (PLAVIX) 75 MG tablet Take 1 tablet (75 mg total) by mouth daily. 12/01/20   Kris [place], NP  Continuous Blood Gluc Receiver (DEXCOM G6 RECEIVER) DEVI Use as directed every 14 days to monitor blood sugars 04/02/21   [name], MD  Continuous Blood Gluc Sensor (DEXCOM G6 SENSOR) MISC Use as directed every 14 days to monitor blood sugars 04/02/21   [name], MD  Continuous Blood Gluc Transmit (DEXCOM G6 TRANSMITTER) MISC Use as directed every 14 days to monitor blood sugars 04/02/21   [name], MD  cyclobenzaprine (FLEXERIL) 10 MG tablet Take 1 tablet (10 mg total) by mouth 3 (three)  times daily as needed for muscle spasms. 03/17/21   [name], MD  gabapentin (NEURONTIN) 300 MG capsule Take 1 capsule (300 mg total) by mouth 3 (three) times daily. 11/28/20   [name], MD  glucose blood (TRUE METRIX BLOOD GLUCOSE TEST) test strip Use as directed twice dailyDx E11.65 03/17/21   [name], MD  glyBURIDE (DIABETA) 5 MG tablet Take 1 tablet (5 mg total) by mouth 2 (two) times daily with a meal. 11/28/20   [name], MD  Lancets [facility] SOFT [facility]) lancets Use as instructed 06/15/19   Ronnell Freshwater, NP  lovastatin (MEVACOR) 10 MG tablet Take 1 tab  Po QHS 03/17/21   [name], MD  meloxicam (MOBIC) 15 MG tablet Take by mouth. 03/09/11   [provider]  metoprolol succinate (TOPROL-XL) 50 MG 24 hr tablet Take 1 tablet (50 mg total) by mouth daily. Take with or  immediately following a meal. 03/17/21   [name], MD  mometasone-formoterol [facility]) 100-5 MCG/ACT AERO Inhale 2 puffs into the lungs 2 (two) times daily as needed for wheezing. 02/05/21   [name], MD  pantoprazole (PROTONIX) 40 MG tablet Take 1 tablet (40 mg total) by mouth daily. 04/02/21   McDonough, Si Gaul, PA-C  traMADol (ULTRAM) 50 MG tablet TAKE 1 TABLET BY MOUTH EVERY 12 HOURS ASNEEDED FOR PAIN 01/28/21   Luiz Ochoa, NP  TRUEplus Lancets 30G MISC by Does not apply route. Use as directed twice a daily Dx E11.65    [provider]    Allergies Penicillins and Bactrim [sulfamethoxazole-trimethoprim]  Family History  Problem Relation Age of Onset   Diabetes Sister     Social History Social History   Tobacco Use   Smoking status: Former    Types: Cigarettes    Quit date: 02/24/1997    Years since quitting: 24.1   Smokeless tobacco: Never  Vaping Use   Vaping Use: Never used  Substance Use Topics   Alcohol use: No   Drug use: No    Review of Systems Constitutional: No fever/chills Eyes: No visual changes. ENT: No sore throat. Cardiovascular: Denies chest pain. Respiratory: Denies shortness of breath. Gastrointestinal: No abdominal pain.  No nausea, no vomiting.  No diarrhea.   Genitourinary: Negative for dysuria. Musculoskeletal: Positive for left foot pain. Skin: Positive for wound to left heel.  Neurological: Negative for headaches, focal weakness or numbness.  ____________________________________________   PHYSICAL EXAM:  VITAL SIGNS: ED Triage Vitals  Enc Vitals Group     BP 04/20/21 1453 100/79     Pulse Rate 04/20/21 1453 87     Resp 04/20/21 1453 17     Temp 04/20/21 1453 97.7 F (36.5 C)     Temp src --      SpO2 04/20/21 1453 97 %     Weight 04/20/21 1407 160 lb 0.9 oz (72.6 kg)     Height 04/20/21 1407 _0  (1.778 m)   Constitutional: Alert and oriented.  Eyes: Conjunctivae are normal.  ENT      Head: Normocephalic  and atraumatic.      Nose: No congestion/rhinnorhea.      Mouth/Throat: Mucous membranes are moist.      Neck: No stridor. Hematological/Lymphatic/Immunilogical: No cervical lymphadenopathy. Cardiovascular: Normal rate, regular rhythm.  No murmurs, rubs, or gallops.  Respiratory: Normal respiratory effort without tachypnea nor retractions. Breath sounds are clear and equal bilaterally. No wheezes/rales/rhonchi. Gastrointestinal: Soft and non tender.  No rebound. No guarding.  Genitourinary: Deferred Musculoskeletal: Normal range of motion in all extremities. No lower extremity edema. Neurologic:  Normal speech and language. No gross focal neurologic deficits are appreciated.  Skin:  Left heel with large malodorous wound. Some necrotic tissue present.  Psychiatric: Mood and affect are normal. Speech and behavior are normal. Patient exhibits appropriate insight and judgment.  ____________________________________________    LABS (pertinent positives/negatives)  Lactic acid 2.2 CMP na 130, k 4.9, glu 343, cr 1.04 CBC wbc 7.5, hgb 9.6, plt 232  ____________________________________________   EKG  None  ____________________________________________    RADIOLOGY  Left foot Osteomyelitis of calcaneus with bony destruction  ____________________________________________   PROCEDURES  Procedures  ____________________________________________   INITIAL IMPRESSION / ASSESSMENT AND PLAN / ED COURSE  Pertinent labs & imaging results that were available during my care of the patient were reviewed by me and considered in my medical decision making (see chart for details).   Patient presented to the emergency department today because of concerns for left heel wound.  On exam patient does have wound to the left heel with some necrotic tissue.  X-ray was obtained which is consistent with osteomyelitis.  Patient was started on IV antibiotics.  Will plan on admission.  Discussed findings and  plan with patient.  ____________________________________________   FINAL CLINICAL IMPRESSION(S) / ED DIAGNOSES  Final diagnoses:  Osteomyelitis of left foot, unspecified type [facility])     Note: This dictation was prepared with Dragon dictation. Any transcriptional errors that result from this process are unintentional     Nance Pear, MD 04/20/21 2157

## 2021-04-20 NOTE — Consult Note (Signed)
Pharmacy Antibiotic Note  [name] is a 70 y.o. male admitted on 04/20/2021 with cellulitis.  Pharmacy has been consulted for Vancomycin dosing.  Plan: Pt to receive a total loading dose of '1500mg'$  IV Vancomycin in ED - will follow with  Vancomycin 1500 mg IV Q 24 hrs. Goal AUC 400-550. Expected AUC: 452 SCr used: 1.04   Height: '5\' 10"'$  (177.8 cm) Weight: 72.6 kg (160 lb 0.9 oz) IBW/kg (Calculated) : 73  Temp (24hrs), Avg:98.1 F (36.7 C), Min:97.7 F (36.5 C), Max:98.4 F (36.9 C)  Recent Labs  Lab 04/20/21 1409  WBC 7.5  CREATININE 1.04  LATICACIDVEN 2.2*    Estimated Creatinine Clearance: 70.8 mL/min (by C-G formula based on SCr of 1.04 mg/dL).    Allergies  Allergen Reactions   Penicillins Rash and Other (See Comments)    Rash in and around the mouth Has patient had a PCN reaction causing immediate rash, facial/tongue/throat swelling, SOB or lightheadedness with hypotension: Yes Has patient had a PCN reaction causing severe rash involving mucus membranes or skin necrosis: Yes Has patient had a PCN reaction that required hospitalization: No Has patient had a PCN reaction occurring within the last 10 years: Yes If all of the above answers are "NO", then may proceed with Cephalosporin use.    Bactrim [Sulfamethoxazole-Trimethoprim] Other (See Comments)    Mouth sores, Sores develop in mouth    Antimicrobials this admission: Vancomycin 7/25 >>  Dose adjustments this admission: N/A  Microbiology results: N/A  Thank you for allowing pharmacy to be a part of this patient's care.  Lu Duffel, PharmD, BCPS Clinical Pharmacist 04/20/2021 11:34 PM

## 2021-04-20 NOTE — H&P (Signed)
History and Physical    DEXTER SIGNOR QRF:[id number] DOB: 06-23-1951 DOA: 04/20/2021  PCP: [name], MD   Patient coming from: home  I have personally briefly reviewed patient's old medical records in [place]  Chief Complaint: wound left heel  HPI: [name] is a 70 y.o. male with medical history significant for DM, HTN, PAD s/p prior amputations digits left foot who presents to the emergency room a for evaluation of a nonhealing painful wound of the left heel which he said he noticed just a week prior.  Has a history of osteomyelitis of the left foot in the past.  He denies fever and chills and denies shortness of breath or chest pain  ED course: On arrival, afebrile, BP 100/79 pulse 87 with O2 sat 97% on room air Blood work with normal WBC but elevated lactic acid of 2.2.  Hemoglobin 9.6, down from baseline of 10.1.  CMP with sodium of 130, glucose 343 but otherwise unremarkable  Imaging: Left foot x-ray with osteomyelitis of posterior calcaneus with significant bony destruction.  Adjacent soft tissue ulcer.  Diffuse soft tissue swelling  Patient started on vancomycin.  Hospitalist consulted for admission. Review of Systems: As per HPI otherwise all other systems on review of systems negative.    Past Medical History:  Diagnosis Date   Cholecystitis, chronic    Cholelithiasis    COPD (chronic obstructive pulmonary disease) ([place])    Diabetes mellitus without complication ([place])    Diabetic retinopathy ([place])    Dyspnea    Fatty liver 2008   Fracture of clavicle 1992   left    Fracture of ribs, multiple 1992   3 ribs   GERD (gastroesophageal reflux disease)    Hyperlipidemia    Hypertension    Paroxysmal SVT (supraventricular tachycardia) ([facility])    Pelvis fracture ([place]) 1992   Peripheral vascular disease ([place])    Pleurisy    Pleurisy    Seasonal allergies     Past Surgical History:  Procedure Laterality Date   AMPUTATION TOE Left 02/25/2017   Procedure:  AMPUTATION TOE-LEFT 2ND MPJ;  Surgeon: Samara Deist, DPM;  Location: [facility] OR[facility];  Service: Podiatry;  Laterality: Left;   AMPUTATION TOE Left 10/20/2018   Procedure: TOE MPJ T2 LEFT;  Surgeon: Samara Deist, DPM;  Location: [facility];  Service: Podiatry;  Laterality: Left;   AMPUTATION TOE Left 01/12/2019   Procedure: AMPUTATION LEFT 5TH TOE AND JOINT;  Surgeon: Samara Deist, DPM;  Location: [facility];  Service: Podiatry;  Laterality: Left;   BACK SURGERY  2008   BONE BIOPSY Left 11/21/2020   Procedure: BONE BIOPSY;  Surgeon: Samara Deist, DPM;  Location: [facility];  Service: Podiatry;  Laterality: Left;   CHOLECYSTECTOMY     COLONOSCOPY WITH PROPOFOL N/A 08/15/2017   Procedure: COLONOSCOPY WITH PROPOFOL;  Surgeon: Lollie Sails, MD;  Location: [facility] ENDOSCOPY;  Service: Endoscopy;  Laterality: N/A;   ENDARTERECTOMY Right 05/12/2017   Procedure: ENDARTERECTOMY CAROTID;  Surgeon: [name], MD;  Location: [facility];  Service: Vascular;  Laterality: Right;   ENDARTERECTOMY FEMORAL Left 12/01/2018   Procedure: ENDARTERECTOMY FEMORAL;  Surgeon: [name], MD;  Location: [facility];  Service: Vascular;  Laterality: Left;   ERCP     FOOT SURGERY Right    x 3   IRRIGATION AND DEBRIDEMENT FOOT Left 11/21/2020   Procedure: IRRIGATION AND DEBRIDEMENT FOOT--with placement of wound vac;  Surgeon: Samara Deist, DPM;  Location: [facility];  Service: Podiatry;  Laterality: Left;   LOWER EXTREMITY ANGIOGRAM Left 12/01/2018   Procedure: LOWER EXTREMITY ANGIOGRAM;  Surgeon: [name], MD;  Location: [facility];  Service: Vascular;  Laterality: Left;   LOWER EXTREMITY ANGIOGRAPHY Left 10/30/2018   Procedure: LOWER EXTREMITY ANGIOGRAPHY;  Surgeon: [name], MD;  Location: [place] CV LAB;  Service: Cardiovascular;  Laterality: Left;   LOWER EXTREMITY ANGIOGRAPHY Left 11/30/2018   Procedure: LOWER EXTREMITY ANGIOGRAPHY;  Surgeon: [name], MD;  Location: [place] [facility];  Service: Cardiovascular;   Laterality: Left;   LOWER EXTREMITY ANGIOGRAPHY Left 11/17/2020   Procedure: Lower Extremity Angiography;  Surgeon: [name], MD;  Location: [place] [facility];  Service: Cardiovascular;  Laterality: Left;   TEE WITHOUT CARDIOVERSION N/A 11/14/2020   Procedure: TRANSESOPHAGEAL ECHOCARDIOGRAM (TEE);  Surgeon: Corey Skains, MD;  Location: [facility];  Service: Cardiovascular;  Laterality: N/A;     reports that he quit smoking about 24 years ago. He has never used smokeless tobacco. He reports that he does not drink alcohol and does not use drugs.  Allergies  Allergen Reactions   Penicillins Rash and Other (See Comments)    Rash in and around the mouth Has patient had a PCN reaction causing immediate rash, facial/tongue/throat swelling, SOB or lightheadedness with hypotension: Yes Has patient had a PCN reaction causing severe rash involving mucus membranes or skin necrosis: Yes Has patient had a PCN reaction that required hospitalization: No Has patient had a PCN reaction occurring within the last 10 years: Yes If all of the above answers are "NO", then may proceed with Cephalosporin use.    Bactrim [Sulfamethoxazole-Trimethoprim] Other (See Comments)    Mouth sores, Sores develop in mouth    Family History  Problem Relation Age of Onset   Diabetes Sister       Prior to Admission medications   Medication Sig Start Date End Date Taking? Authorizing Provider  Alcohol Swabs (B-D SINGLE USE SWABS REGULAR) PADS by Does not apply route. Use as directed    [provider]  aspirin EC 81 MG tablet Take 81 mg by mouth daily.    [provider]  Blood Glucose Calibration (TRUE METRIX LEVEL 1) Low SOLN Use as directed when needed dx E11.65 03/17/21   [name], MD  Blood Glucose Monitoring Suppl (TRUE METRIX GO GLUCOSE METER) w/Device KIT Use as directed to check glucose DX e11.65 03/17/21   [name], MD  Calcium Carbonate-Vitamin D (CALCIUM PLUS VITAMIN D PO)  Take 1 tablet by mouth daily.    [provider]  clopidogrel (PLAVIX) 75 MG tablet Take 1 tablet (75 mg total) by mouth daily. 12/01/20   Kris Hartmann, NP  Continuous Blood Gluc Receiver (DEXCOM G6 RECEIVER) DEVI Use as directed every 14 days to monitor blood sugars 04/02/21   [name], MD  Continuous Blood Gluc Sensor (DEXCOM G6 SENSOR) MISC Use as directed every 14 days to monitor blood sugars 04/02/21   [name], MD  Continuous Blood Gluc Transmit (DEXCOM G6 TRANSMITTER) MISC Use as directed every 14 days to monitor blood sugars 04/02/21   [name], MD  cyclobenzaprine (FLEXERIL) 10 MG tablet Take 1 tablet (10 mg total) by mouth 3 (three) times daily as needed for muscle spasms. 03/17/21   [name], MD  gabapentin (NEURONTIN) 300 MG capsule Take 1 capsule (300 mg total) by mouth 3 (three) times daily. 11/28/20   [name],  [name], MD  glucose blood (TRUE METRIX BLOOD GLUCOSE TEST) test strip Use as directed twice dailyDx E11.65 03/17/21   [name], MD  glyBURIDE (DIABETA) 5 MG tablet Take 1 tablet (5 mg total) by mouth 2 (two) times daily with a meal. 11/28/20   [name], MD  Lancets [facility] SOFT [facility]) lancets Use as instructed 06/15/19   Ronnell Freshwater, NP  lovastatin (MEVACOR) 10 MG tablet Take 1 tab  Po QHS 03/17/21   [name], MD  meloxicam (MOBIC) 15 MG tablet Take by mouth. 03/09/11   [provider]  metoprolol succinate (TOPROL-XL) 50 MG 24 hr tablet Take 1 tablet (50 mg total) by mouth daily. Take with or immediately following a meal. 03/17/21   [name], MD  mometasone-formoterol [facility]) 100-5 MCG/ACT AERO Inhale 2 puffs into the lungs 2 (two) times daily as needed for wheezing. 02/05/21   [name], MD  pantoprazole (PROTONIX) 40 MG tablet Take 1 tablet (40 mg total) by mouth daily. 04/02/21   McDonough, Si Gaul, PA-C  traMADol (ULTRAM) 50 MG tablet TAKE 1 TABLET BY MOUTH EVERY 12 HOURS ASNEEDED FOR PAIN 01/28/21   Luiz Ochoa, NP   TRUEplus Lancets 30G MISC by Does not apply route. Use as directed twice a daily Dx E11.65    [provider]    Physical Exam: Vitals:   04/20/21 1407 04/20/21 1453 04/20/21 1759 04/20/21 1950  BP:  100/79 116/77 101/60  Pulse:  87 98 92  Resp:  $Remo'17 18 18  'uwfsr$ Temp:  97.7 F (36.5 C) 98.4 F (36.9 C)   SpO2:  97% (!) 83% 97%  Weight: 72.6 kg     Height: $Remove'5\' 10"'BxJIeuk$  (1.778 m)        Vitals:   04/20/21 1407 04/20/21 1453 04/20/21 1759 04/20/21 1950  BP:  100/79 116/77 101/60  Pulse:  87 98 92  Resp:  $Remo'17 18 18  'KStrT$ Temp:  97.7 F (36.5 C) 98.4 F (36.9 C)   SpO2:  97% (!) 83% 97%  Weight: 72.6 kg     Height: $Remove'5\' 10"'uceYrnw$  (1.778 m)         Constitutional: Alert and oriented x 3 . Not in any apparent distress HEENT:      Head: Normocephalic and atraumatic.         Eyes: PERLA, EOMI, Conjunctivae are normal. Sclera is non-icteric.       Mouth/Throat: Mucous membranes are moist.       Neck: Supple with no signs of meningismus. Cardiovascular: Regular rate and rhythm. No murmurs, gallops, or rubs. 1+ symmetrical distal pulses are present . No JVD.  Mild swelling left foot LE edema Respiratory: Respiratory effort normal .Lungs sounds clear bilaterally. No wheezes, crackles, or rhonchi.  Gastrointestinal: Soft, non tender, and non distended with positive bowel sounds.  Genitourinary: No CVA tenderness. Musculoskeletal: Prior amputations second fourth and fifth digit left foot.  5 cm diameter ulcer left heel with necrotic base. Neurologic:  Face is symmetric. Moving all extremities. No gross focal neurologic deficits . Skin: Ulcer left heel, see musculoskeletal Psychiatric: Mood and affect are normal    Labs on Admission: I have personally reviewed following labs and imaging studies  CBC: Recent Labs  Lab 04/20/21 1409  WBC 7.5  NEUTROABS 5.8  HGB 9.6*  HCT 29.6*  MCV 80.0  PLT 300   Basic Metabolic Panel: Recent Labs  Lab 04/20/21 1409  NA 130*  K 4.9  CL 96*  CO2  25  GLUCOSE 343*  BUN 21  CREATININE 1.04  CALCIUM 8.6*   GFR: Estimated Creatinine Clearance: 70.8 mL/min (by C-G formula based on SCr of 1.04 mg/dL). Liver Function Tests: Recent Labs  Lab 04/20/21 1409  AST 17  ALT 14  ALKPHOS 76  BILITOT 0.6  PROT 7.1  ALBUMIN 2.7*   No results for input(s): LIPASE, AMYLASE in the last 168 hours. No results for input(s): AMMONIA in the last 168 hours. Coagulation Profile: No results for input(s): INR, PROTIME in the last 168 hours. Cardiac Enzymes: No results for input(s): CKTOTAL, CKMB, CKMBINDEX, TROPONINI in the last 168 hours. BNP (last 3 results) No results for input(s): PROBNP in the last 8760 hours. HbA1C: No results for input(s): HGBA1C in the last 72 hours. CBG: No results for input(s): GLUCAP in the last 168 hours. Lipid Profile: No results for input(s): CHOL, HDL, LDLCALC, TRIG, CHOLHDL, LDLDIRECT in the last 72 hours. Thyroid Function Tests: No results for input(s): TSH, T4TOTAL, FREET4, T3FREE, THYROIDAB in the last 72 hours. Anemia Panel: No results for input(s): VITAMINB12, FOLATE, FERRITIN, TIBC, IRON, RETICCTPCT in the last 72 hours. Urine analysis:    Component Value Date/Time   COLORURINE YELLOW (A) 07/11/2015 0831   APPEARANCEUR Clear 06/20/2020 0946   LABSPEC 1.012 07/11/2015 0831   PHURINE 6.0 07/11/2015 0831   GLUCOSEU Negative 06/20/2020 0946   HGBUR 1+ (A) 07/11/2015 0831   BILIRUBINUR Negative 06/20/2020 0946   KETONESUR NEGATIVE 07/11/2015 0831   PROTEINUR Negative 06/20/2020 0946   PROTEINUR NEGATIVE 07/11/2015 0831   NITRITE Negative 06/20/2020 0946   NITRITE NEGATIVE 07/11/2015 0831   LEUKOCYTESUR Negative 06/20/2020 0946    Radiological Exams on Admission: DG Foot Complete Left  Result Date: 04/20/2021 CLINICAL DATA:  Heel ulcer EXAM: LEFT FOOT - COMPLETE 3+ VIEW COMPARISON:  11/11/2020 FINDINGS: There is diffuse soft tissue swelling of the foot with lucency along the heel suggestive of heel  ulcer. There is new bony destruction of the posterior calcaneus. Vascular calcifications. Prior second and third toe amputations and fifth ray amputation to the level of the mid metatarsal. Severe hallux valgus with bunion formation. IMPRESSION: Osteomyelitis of the posterior calcaneus with significant bony destruction. Adjacent soft tissue ulcer. Diffuse soft tissue swelling. Electronically Signed   By: Maurine Simmering   On: 04/20/2021 21:26     Assessment/Plan 70 year old male with history of DM, HTN, PAD who presenting with a wound of the left heel x1 week.  Has a history of osteomyelitis of the left foot in the past.     Diabetic foot ulcer, left   Acute osteomyelitis of left foot    PAD - Rocephin and vancomycin - Podiatry consult - Vascular consult - Continue lovastatin and aspirin.  Will hold Plavix - We will keep patient n.p.o. after midnight in case of procedure in the a.m.  Elevated lactic acid level - Likely secondary to tissue hypoxia from wound - No other signs of sepsis/SIRS criteria - Continue to trend    Essential hypertension - Continue home metoprolol    Uncontrolled type 2 diabetes mellitus with hyperglycemia -blood sugar of 343 - Sliding scale insulin coverage    Anemia - Hemoglobin 9.6 but without evidence of acute bleeding.  Baseline 10.1 - Continue to monitor  Chronic hyponatremia -Sodium 130, baseline 131-133    DVT prophylaxis: SCD Code Status: full code  Family Communication:  none  Disposition Plan: Back to previous home environment Consults called: Vascular and podiatry  Status:At the time of admission, it appears that the appropriate admission status for this patient is INPATIENT. This is judged to be reasonable and necessary in order to provide the required intensity of service to ensure the patient's safety given the presenting symptoms, physical exam findings, and initial radiographic and laboratory data in the context of their  Comorbid conditions.    Patient requires inpatient status due to high intensity of service, high risk for further deterioration and high frequency of surveillance required.   I certify that at the point of admission it is my clinical judgment that the patient will require inpatient hospital care spanning beyond [place] MD [facility]     04/20/2021, 10:50 PM

## 2021-04-21 ENCOUNTER — Inpatient Hospital Stay: Payer: Medicare HMO

## 2021-04-21 ENCOUNTER — Other Ambulatory Visit (INDEPENDENT_AMBULATORY_CARE_PROVIDER_SITE_OTHER): Payer: Self-pay | Admitting: Vascular Surgery

## 2021-04-21 DIAGNOSIS — E11628 Type 2 diabetes mellitus with other skin complications: Secondary | ICD-10-CM | POA: Diagnosis not present

## 2021-04-21 DIAGNOSIS — M869 Osteomyelitis, unspecified: Secondary | ICD-10-CM | POA: Diagnosis not present

## 2021-04-21 DIAGNOSIS — L089 Local infection of the skin and subcutaneous tissue, unspecified: Secondary | ICD-10-CM | POA: Diagnosis not present

## 2021-04-21 LAB — URINALYSIS, COMPLETE (UACMP) WITH MICROSCOPIC
Bilirubin Urine: NEGATIVE
Glucose, UA: >=500 mg/dL — AB
Hgb urine dipstick: NEGATIVE
Ketones, ur: NEGATIVE mg/dL
Leukocytes,Ua: NEGATIVE
Nitrite: NEGATIVE
Protein, ur: NEGATIVE mg/dL
Specific Gravity, Urine: 1.018 (ref 1.005–1.030)
pH: 6 (ref 5.0–8.0)

## 2021-04-21 LAB — RESP PANEL BY RT-PCR (FLU A&B, COVID) ARPGX2
Influenza A by PCR: NEGATIVE
Influenza B by PCR: NEGATIVE
SARS Coronavirus 2 by RT PCR: NEGATIVE

## 2021-04-21 LAB — BASIC METABOLIC PANEL
Anion gap: 9 (ref 5–15)
BUN: 15 mg/dL (ref 8–23)
CO2: 25 mmol/L (ref 22–32)
Calcium: 8.5 mg/dL — ABNORMAL LOW (ref 8.9–10.3)
Chloride: 98 mmol/L (ref 98–111)
Creatinine, Ser: 0.9 mg/dL (ref 0.61–1.24)
GFR, Estimated: >60 mL/min (ref >60)
Glucose, Bld: 169 mg/dL — ABNORMAL HIGH (ref 70–99)
Potassium: 4.1 mmol/L (ref 3.5–5.1)
Sodium: 132 mmol/L — ABNORMAL LOW (ref 135–145)

## 2021-04-21 LAB — GLUCOSE, CAPILLARY
Glucose-Capillary: 192 mg/dL — ABNORMAL HIGH (ref 70–99)
Glucose-Capillary: 232 mg/dL — ABNORMAL HIGH (ref 70–99)
Glucose-Capillary: 177 mg/dL — ABNORMAL HIGH (ref 70–99)

## 2021-04-21 LAB — CBC
HCT: 26.6 % — ABNORMAL LOW (ref 39.0–52.0)
Hemoglobin: 8.4 g/dL — ABNORMAL LOW (ref 13.0–17.0)
MCH: 25.8 pg — ABNORMAL LOW (ref 26.0–34.0)
MCHC: 31.6 g/dL (ref 30.0–36.0)
MCV: 81.8 fL (ref 80.0–100.0)
Platelets: 219 10*3/uL (ref 150–400)
RBC: 3.25 MIL/uL — ABNORMAL LOW (ref 4.22–5.81)
RDW: 15.1 % (ref 11.5–15.5)
WBC: 6.7 10*3/uL (ref 4.0–10.5)
nRBC: 0 % (ref 0.0–0.2)

## 2021-04-21 LAB — CBG MONITORING, ED
Glucose-Capillary: 110 mg/dL — ABNORMAL HIGH (ref 70–99)
Glucose-Capillary: 207 mg/dL — ABNORMAL HIGH (ref 70–99)
Glucose-Capillary: 245 mg/dL — ABNORMAL HIGH (ref 70–99)

## 2021-04-21 IMAGING — MR MR ANKLE*L* W/O CM
5 series · 40 of 40 positions shown · non-contrast
Comparison: Foot radiograph [DATE]

CLINICAL DATA: L calc osteo with wound, severe

EXAM:
MRI OF THE LEFT ANKLE WITHOUT CONTRAST
TECHNIQUE: Multiplanar, multisequence MR imaging of the ankle was performed. No
intravenous contrast was administered.

[Series 3: PD fat-sat · axial · left · 3.0mm · 0.50mm/px · z∈[-47,+92]mm · 10 of 36 slices shown]
[im 1/36]
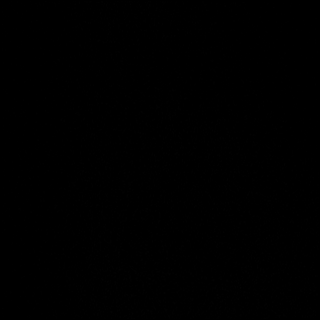
[im 4/36]
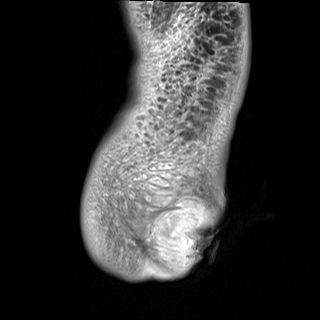
[im 8/36]
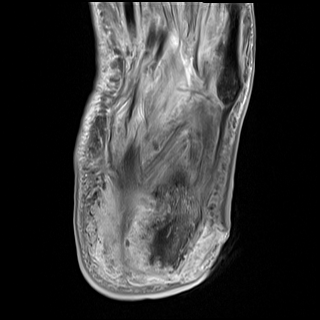
[im 12/36]
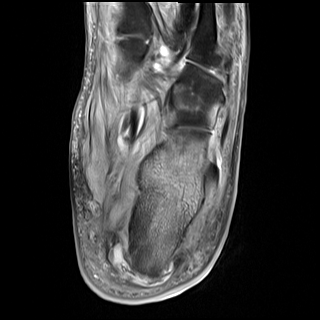
[im 16/36]
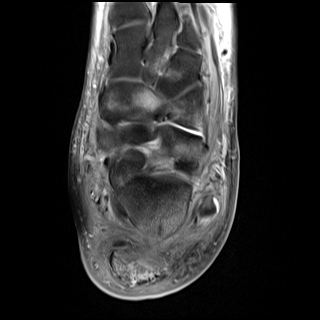
[im 20/36]
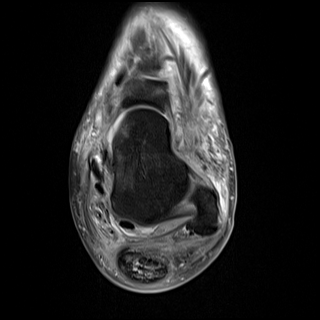
[im 24/36]
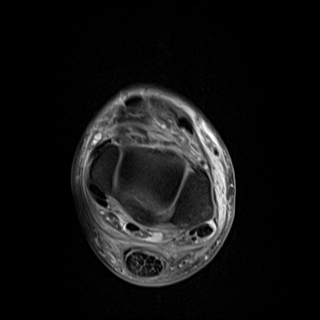
[im 28/36]
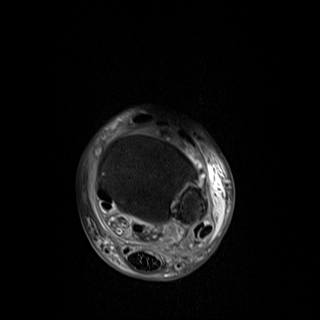
[im 32/36]
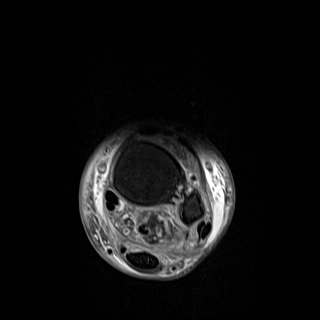
[im 36/36]
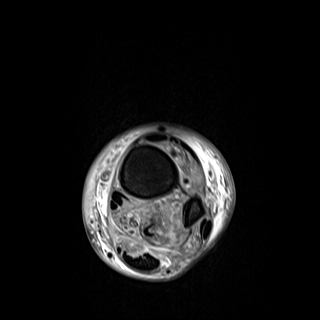

[Series 4: T2 fat-sat · axial · left · 3.0mm · 0.50mm/px · z∈[-47,+92]mm · 10 of 36 slices shown]
[im 1/36]
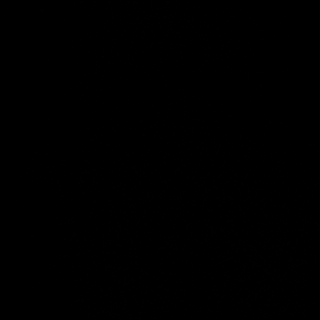
[im 4/36]
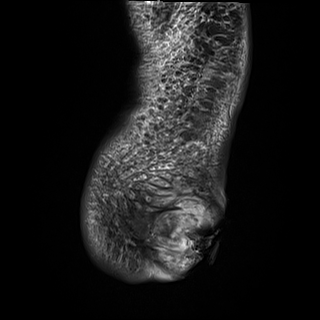
[im 8/36]
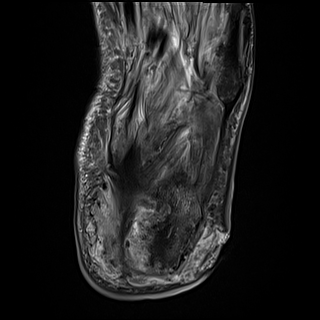
[im 12/36]
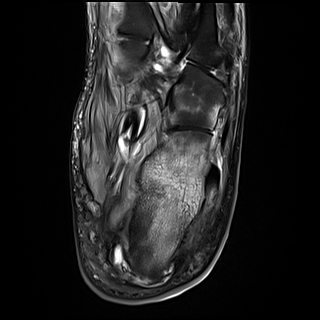
[im 16/36]
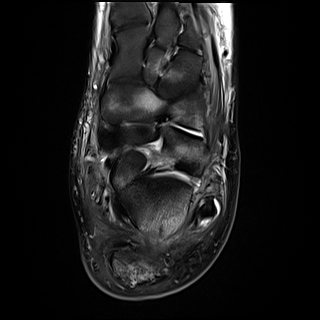
[im 20/36]
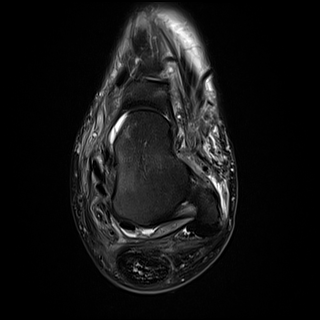
[im 24/36]
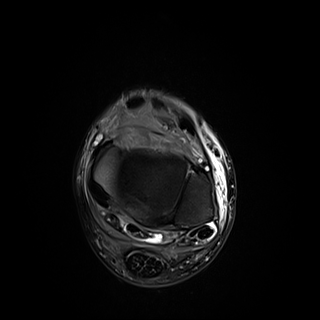
[im 28/36]
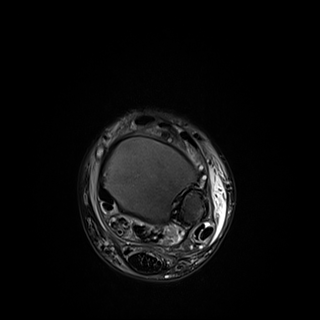
[im 32/36]
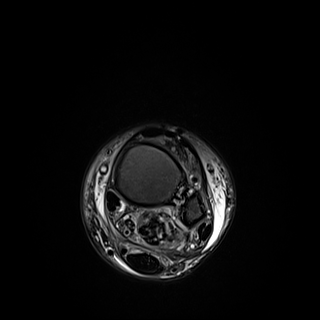
[im 36/36]
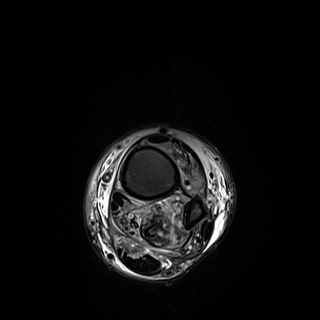

[Series 6: T2 · coronal · left · 3.0mm · 0.62mm/px · 10 of 40 slices shown]
[im 1/40]
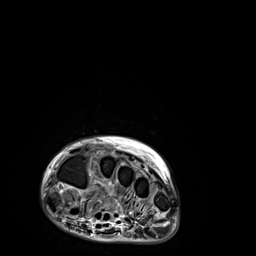
[im 5/40]
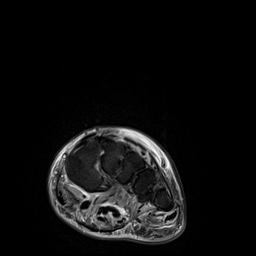
[im 9/40]
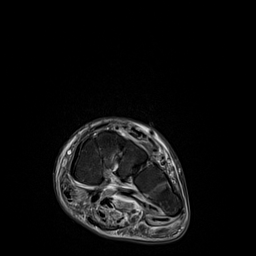
[im 14/40]
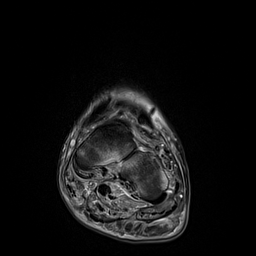
[im 18/40]
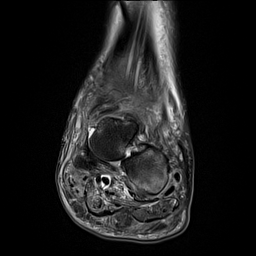
[im 22/40]
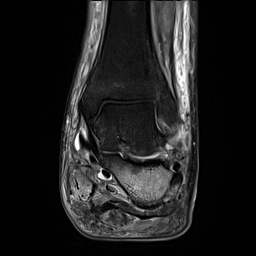
[im 27/40]
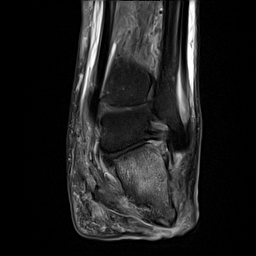
[im 31/40]
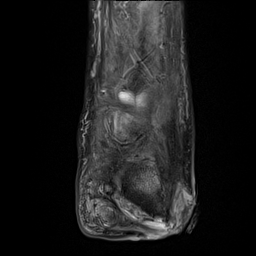
[im 35/40]
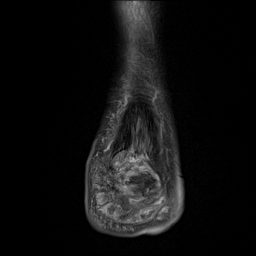
[im 40/40]
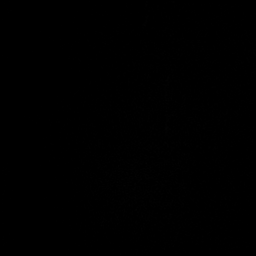

[Series 7: T1 · sagittal · left · 4.0mm · 0.70mm/px · 5 of 21 slices shown]
[im 1/21]
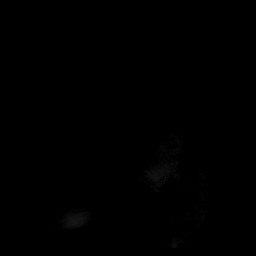
[im 6/21]
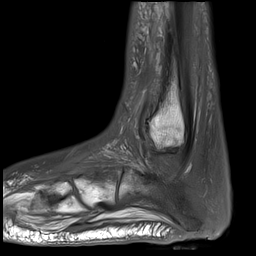
[im 11/21]
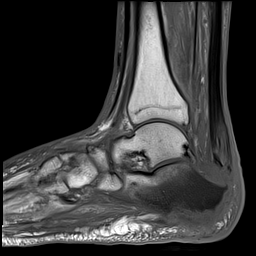
[im 16/21]
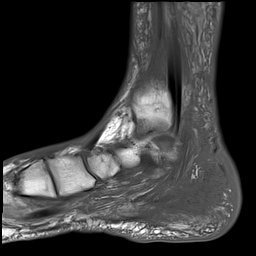
[im 21/21]
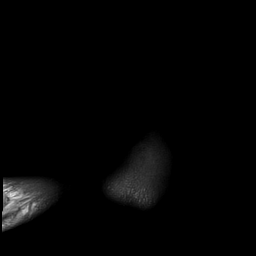

[Series 9: STIR · sagittal · left · 4.0mm · 0.35mm/px · 5 of 21 slices shown]
[im 1/21]
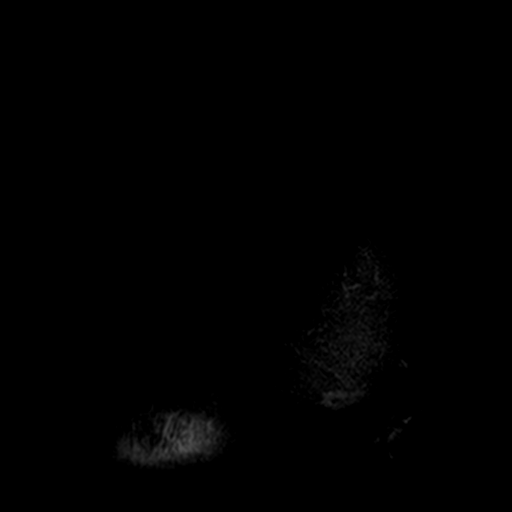
[im 6/21]
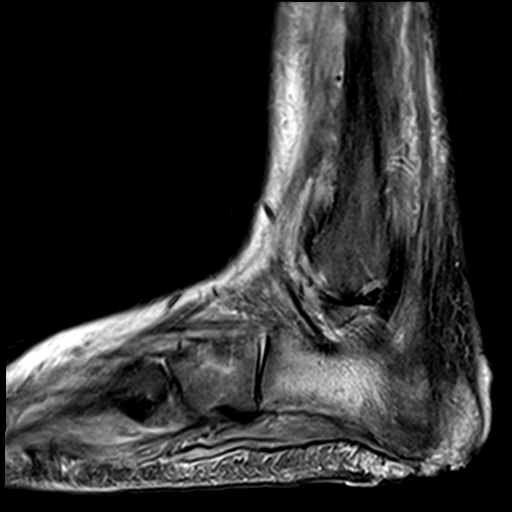
[im 11/21]
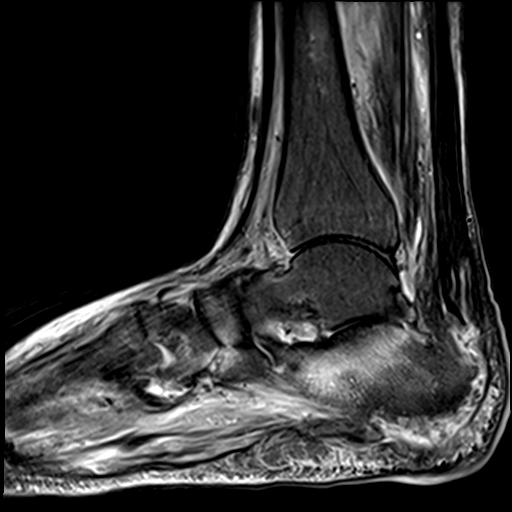
[im 16/21]
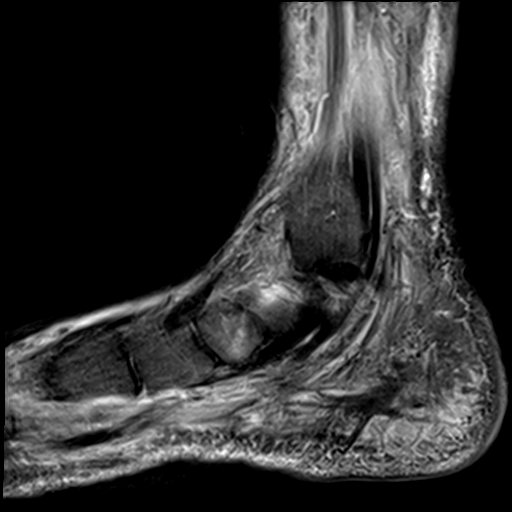
[im 21/21]
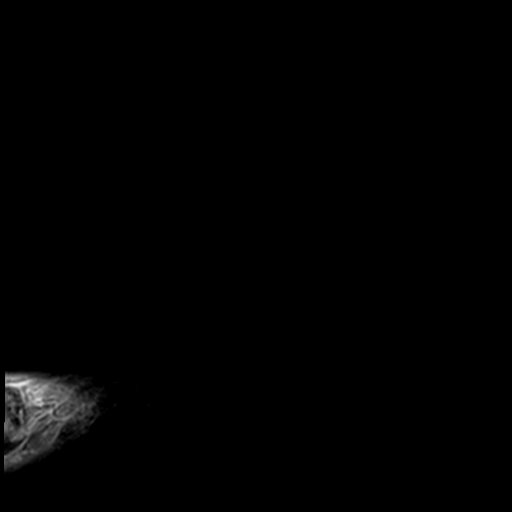

[40 of 40 positions shown; findings below may reference images not displayed]

FINDINGS: TENDONS

Peroneal: There is tenosynovitis of the peroneal longus and brevis
tendons above and below the malleolus. There is an accessory
peroneal tendon inserting on the lateral calcaneus consistent with a
peroneus quartus. There is continuity distally through its
attachment on the base of the fifth metatarsal.

Posteromedial: Tenosynovitis of the posterior tibial tendon below
the malleolus at the level of the talar head/neck. Flexor digitorum
longus tendon intact. Flexor hallucis longus tendon intact.

Anterior: Tibialis anterior tendon intact. Extensor hallucis longus
tendon intact Extensor digitorum longus tendon intact.

Achilles:  Distal Achilles tendinosis and full-thickness tearing.

Plantar Fascia: Disruption of the proximal medial and lateral bundle
plantar fascia attachment.

LIGAMENTS

Lateral: Anterior talofibular ligament intact. Calcaneofibular
ligament intact. Posterior talofibular ligament intact. Anterior and
posterior tibiofibular ligaments intact.

Medial: Chronic deep deltoid sprain.  Spring ligament intact.

CARTILAGE

Ankle Joint: No joint effusion. Normal ankle mortise. No chondral
defect.

Subtalar Joints/Sinus Tarsi: Normal subtalar joints. No subtalar
joint effusion. Normal sinus tarsi.

Bones: There is bone destruction of the posterior calcaneus with
extensive bony edema and confluent low T1 signal extending
throughout the calcaneal body, with a mildly preserved fat in the
most anterior portion, subchondral in the posterior subtalar facet,
and in the sustentaculum tali.

There is patchy bone marrow edema within the navicular, cuboid,
middle and lateral cuneiforms, likely stress reaction.

Soft Tissue: Heel soft tissue ulcer noted. Generalized soft tissue
swelling of the foot and ankle including the intrinsic musculature
of the foot. The origin of the quadratus plantae and abductor
hallucis tendons are disrupted (coronal T2 images 27, 25).
IMPRESSION: Extensive osteomyelitis of the calcaneus, with bony destruction of
the posterior and plantar aspect. Disruption of the distal Achilles
tendon and proximal medial/lateral bundle plantar fascia
attachments. Disrupted origins of the quadratus plantae and abductor
hallucis tendons. Adjacent soft tissue ulcer with soft tissue
inflammation but no well-defined abscess.

Tenosynovitis of the peroneal longus and brevis tendons above and
below the malleolus. Anatomic variant peroneus quartus is present.

Tenosynovitis of the posterior tibial tendon below the malleolus at
the level of the talar head/neck.

Patchy bone marrow edema within the navicular, cuboid, middle and
lateral cuneiforms, likely stress reaction.

## 2021-04-21 MED ORDER — DIPHENHYDRAMINE HCL 25 MG PO CAPS
25.0000 mg | ORAL_CAPSULE | Freq: Four times a day (QID) | ORAL | Status: DC | PRN
  Administered 2021-04-25 – 2021-04-28 (×3): 25 mg via ORAL
  Filled 2021-04-21 (×3): qty 1

## 2021-04-21 MED ORDER — SODIUM CHLORIDE 0.9 % IV SOLN: INTRAVENOUS | Status: DC

## 2021-04-21 MED ORDER — METOPROLOL SUCCINATE ER 50 MG PO TB24
50.0000 mg | ORAL_TABLET | Freq: Every day | ORAL | Status: DC
  Administered 2021-04-21 – 2021-04-28 (×6): 50 mg via ORAL
  Filled 2021-04-21 (×8): qty 1

## 2021-04-21 MED ORDER — METRONIDAZOLE 500 MG/100ML IV SOLN
500.0000 mg | Freq: Three times a day (TID) | INTRAVENOUS | Status: DC
  Administered 2021-04-21 – 2021-04-24 (×8): 500 mg via INTRAVENOUS
  Filled 2021-04-21 (×10): qty 100

## 2021-04-21 MED ORDER — ENOXAPARIN SODIUM 40 MG/0.4ML IJ SOSY
40.0000 mg | PREFILLED_SYRINGE | INTRAMUSCULAR | Status: DC
  Administered 2021-04-21 – 2021-04-27 (×7): 40 mg via SUBCUTANEOUS
  Filled 2021-04-21 (×7): qty 0.4

## 2021-04-21 MED ORDER — TRAMADOL HCL 50 MG PO TABS
50.0000 mg | ORAL_TABLET | Freq: Every day | ORAL | Status: DC
  Administered 2021-04-21 – 2021-04-27 (×7): 50 mg via ORAL
  Filled 2021-04-21 (×7): qty 1

## 2021-04-21 MED ORDER — GABAPENTIN 300 MG PO CAPS
300.0000 mg | ORAL_CAPSULE | Freq: Three times a day (TID) | ORAL | Status: DC
  Administered 2021-04-21 – 2021-04-28 (×19): 300 mg via ORAL
  Filled 2021-04-21 (×20): qty 1

## 2021-04-21 MED ORDER — CYCLOBENZAPRINE HCL 10 MG PO TABS
10.0000 mg | ORAL_TABLET | Freq: Three times a day (TID) | ORAL | Status: DC | PRN
  Administered 2021-04-24 – 2021-04-28 (×4): 10 mg via ORAL
  Filled 2021-04-21 (×4): qty 1

## 2021-04-21 MED ORDER — INSULIN GLARGINE-YFGN 100 UNIT/ML ~~LOC~~ SOLN
5.0000 [IU] | Freq: Every day | SUBCUTANEOUS | Status: DC
  Administered 2021-04-21 – 2021-04-22 (×2): 5 [IU] via SUBCUTANEOUS
  Filled 2021-04-21 (×3): qty 0.05

## 2021-04-21 MED ORDER — INSULIN ASPART 100 UNIT/ML IJ SOLN
0.0000 [IU] | Freq: Three times a day (TID) | INTRAMUSCULAR | Status: DC
  Administered 2021-04-21: 5 [IU] via SUBCUTANEOUS
  Administered 2021-04-22: 8 [IU] via SUBCUTANEOUS
  Administered 2021-04-22: 5 [IU] via SUBCUTANEOUS
  Administered 2021-04-23 (×2): 3 [IU] via SUBCUTANEOUS
  Administered 2021-04-24: 11 [IU] via SUBCUTANEOUS
  Administered 2021-04-24: 8 [IU] via SUBCUTANEOUS
  Administered 2021-04-24: 5 [IU] via SUBCUTANEOUS
  Administered 2021-04-25: 3 [IU] via SUBCUTANEOUS
  Administered 2021-04-25: 5 [IU] via SUBCUTANEOUS
  Administered 2021-04-25: 3 [IU] via SUBCUTANEOUS
  Administered 2021-04-26: 5 [IU] via SUBCUTANEOUS
  Administered 2021-04-26: 3 [IU] via SUBCUTANEOUS
  Administered 2021-04-26: 5 [IU] via SUBCUTANEOUS
  Administered 2021-04-27 – 2021-04-28 (×5): 3 [IU] via SUBCUTANEOUS
  Filled 2021-04-21 (×20): qty 1

## 2021-04-21 MED ORDER — INSULIN ASPART 100 UNIT/ML IJ SOLN
0.0000 [IU] | Freq: Every day | INTRAMUSCULAR | Status: DC
  Administered 2021-04-22 – 2021-04-24 (×3): 2 [IU] via SUBCUTANEOUS
  Administered 2021-04-25: 4 [IU] via SUBCUTANEOUS
  Administered 2021-04-26: 3 [IU] via SUBCUTANEOUS
  Filled 2021-04-21 (×5): qty 1

## 2021-04-21 MED ORDER — MOMETASONE FURO-FORMOTEROL FUM 100-5 MCG/ACT IN AERO
2.0000 | INHALATION_SPRAY | Freq: Two times a day (BID) | RESPIRATORY_TRACT | Status: DC
  Administered 2021-04-21 – 2021-04-28 (×14): 2 via RESPIRATORY_TRACT
  Filled 2021-04-21: qty 8.8

## 2021-04-21 MED ORDER — ASPIRIN EC 81 MG PO TBEC
81.0000 mg | DELAYED_RELEASE_TABLET | Freq: Every day | ORAL | Status: DC
  Administered 2021-04-21 – 2021-04-28 (×6): 81 mg via ORAL
  Filled 2021-04-21 (×8): qty 1

## 2021-04-21 MED ORDER — SODIUM CHLORIDE 0.9 % IV SOLN
2.0000 g | INTRAVENOUS | Status: DC
  Administered 2021-04-21 – 2021-04-24 (×4): 2 g via INTRAVENOUS
  Filled 2021-04-21 (×2): qty 20
  Filled 2021-04-21 (×2): qty 2

## 2021-04-21 MED ORDER — INSULIN ASPART 100 UNIT/ML IJ SOLN
INTRAMUSCULAR | Status: AC
  Administered 2021-04-21: 4 [IU] via SUBCUTANEOUS
  Filled 2021-04-21: qty 1

## 2021-04-21 NOTE — ED Notes (Addendum)
Pt escorted to the restroom via wheelchair for urine sample.   Pts heel was wrapped with nonadherent gauze and curlex.

## 2021-04-21 NOTE — ED Notes (Signed)
Pt provided a urinal.  

## 2021-04-21 NOTE — ED Notes (Signed)
Patient transported to MRI 

## 2021-04-21 NOTE — ED Notes (Signed)
Pt given phone for MRI screening.

## 2021-04-21 NOTE — Progress Notes (Addendum)
PROGRESS NOTE    [name]  [id number] DOB: Jan 30, 1951 DOA: 04/20/2021 PCP: [name], MD   Brief Narrative:  70 y.o. male with medical history significant for DM, HTN, PAD s/p prior amputations digits left foot who presents to the emergency room a for evaluation of a nonhealing painful wound of the left heel which he said he noticed just a week prior.  Has a history of osteomyelitis of the left foot in the past.  He denies fever and chills and denies shortness of breath or chest pain  Patient was admitted to the hospitalist service with podiatry and vascular surgery consulted.  Vascular surgery to take patient for angiography and revascularization attempt on 7/27.  Patient is hemodynamically stable.  Imaging survey significant for osteomyelitis of posterior calcaneus with associated bony destruction and diffuse soft tissue swelling consistent with cellulitis.   Assessment & Plan:   Principal Problem:   Diabetic foot infection ([place]) Active Problems:   PAD (peripheral artery disease) ([place])   Essential hypertension   Uncontrolled type 2 diabetes mellitus with hyperglycemia ([facility])   Acute osteomyelitis of left foot ([facility])   Anemia   Elevated lactic acid level   Hyponatremia  Acute osteomyelitis left foot Diabetic ulcer left foot Peripheral arterial disease Patient complains of wound to left heel worsening over the past week History of osteomyelitis on affected extremity with history of palpitations Vascular surgery podiatry consulted from ED Plan: Broad-spectrum antibiotics with vancomycin, Rocephin, Flagyl Continue statin and aspirin Hold Plavix Okay for diet today, n.p.o. after midnight Angiography with vascular surgery on 7/27 Podiatry consulted and has ordered MRI, will follow-up results  Essential hypertension PTA metoprolol  Type 2 diabetes mellitus uncontrolled with hyperglycemia Last hemoglobin A1c in May was 7.1 Plan: Lantus 5 units daily Moderate  sliding scale Nightly coverage Modified diet  Anemia of chronic disease Unclear underlying disease Suspect PAD versus uncontrolled diabetes Hemoglobin low but stable No indication for transfusion  Chronic hyponatremia Likely euvolemic hyponatremia Continue to monitor   DVT prophylaxis: SQ Lovenox Code Status: Full Family Communication: None today Disposition Plan: Status is: Inpatient  Remains inpatient appropriate because:Inpatient level of care appropriate due to severity of illness  Dispo: The patient is from: Home              Anticipated d/c is to: Home              Patient currently is not medically stable to d/c.   Difficult to place patient No       Level of care: Med-Surg  Consultants:  Vascular surgery Podiatry  Procedures:  None  Antimicrobials:  Vancomycin Rocephin Metronidazole   Subjective: Seen and examined.  Remained in ED at time of my evaluation this morning.  No visible distress.  Only complaint is pain in left foot  Objective: Vitals:   04/21/21 0630 04/21/21 0931 04/21/21 1027 04/21/21 1106  BP: 121/72 112/71 129/68 120/77  Pulse: 89 69 72 97  Resp: '18 18 16 17  '$ Temp:  98.4 F (36.9 C)  97.8 F (36.6 C)  TempSrc:  Oral    SpO2: 99% 99% 95% 98%  Weight:      Height:        Intake/Output Summary (Last 24 hours) at 04/21/2021 1237 Last data filed at 04/21/2021 0104 Gross per 24 hour  Intake 300 ml  Output --  Net 300 ml   Filed Weights   04/20/21 1407  Weight: 72.6 kg    Examination:  General exam: Appears calm and comfortable  Respiratory system: Clear to auscultation. Respiratory effort normal. Cardiovascular system: S1 & S2 heard, RRR. No JVD, murmurs, rubs, gallops or clicks. No pedal edema. Gastrointestinal system: Abdomen is nondistended, soft and nontender. No organomegaly or masses felt. Normal bowel sounds heard. Central nervous system: Alert and oriented. No focal neurological deficits. Extremities:  Decreased power left lower extremity.  Left foot multiple toe amputations.  And 5cm left heel ulcer.  Weak bilateral pedal pulses Skin: No rashes, lesions or ulcers Psychiatry: Judgement and insight appear normal. Mood & affect appropriate.     Data Reviewed: I have personally reviewed following labs and imaging studies  CBC: Recent Labs  Lab 04/20/21 1409 04/21/21 0625  WBC 7.5 6.7  NEUTROABS 5.8  --   HGB 9.6* 8.4*  HCT 29.6* 26.6*  MCV 80.0 81.8  PLT 232 [id number]   Basic Metabolic Panel: Recent Labs  Lab 04/20/21 1409 04/21/21 0625  NA 130* 132*  K 4.9 4.1  CL 96* 98  CO2 25 25  GLUCOSE 343* 169*  BUN 21 15  CREATININE 1.04 0.90  CALCIUM 8.6* 8.5*   GFR: Estimated Creatinine Clearance: 81.8 mL/min (by C-G formula based on SCr of 0.9 mg/dL). Liver Function Tests: Recent Labs  Lab 04/20/21 1409  AST 17  ALT 14  ALKPHOS 76  BILITOT 0.6  PROT 7.1  ALBUMIN 2.7*   No results for input(s): LIPASE, AMYLASE in the last 168 hours. No results for input(s): AMMONIA in the last 168 hours. Coagulation Profile: No results for input(s): INR, PROTIME in the last 168 hours. Cardiac Enzymes: No results for input(s): CKTOTAL, CKMB, CKMBINDEX, TROPONINI in the last 168 hours. BNP (last 3 results) No results for input(s): PROBNP in the last 8760 hours. HbA1C: No results for input(s): HGBA1C in the last 72 hours. CBG: Recent Labs  Lab 04/21/21 0007 04/21/21 0343 04/21/21 0739 04/21/21 1108  GLUCAP 245* 110* 207* 192*   Lipid Profile: No results for input(s): CHOL, HDL, LDLCALC, TRIG, CHOLHDL, LDLDIRECT in the last 72 hours. Thyroid Function Tests: No results for input(s): TSH, T4TOTAL, FREET4, T3FREE, THYROIDAB in the last 72 hours. Anemia Panel: No results for input(s): VITAMINB12, FOLATE, FERRITIN, TIBC, IRON, RETICCTPCT in the last 72 hours. Sepsis Labs: Recent Labs  Lab 04/20/21 1409 04/20/21 2130  LATICACIDVEN 2.2* 1.6    Recent Results (from the past 240  hour(s))  Resp Panel by RT-PCR (Flu A&B, Covid) Nasopharyngeal Swab     Status: None   Collection Time: 04/20/21 10:38 PM   Specimen: Nasopharyngeal Swab; Nasopharyngeal(NP) swabs in vial transport medium  Result Value Ref Range Status   SARS Coronavirus 2 by RT PCR NEGATIVE NEGATIVE Final    Comment: (NOTE) SARS-CoV-2 target nucleic acids are NOT DETECTED.  The SARS-CoV-2 RNA is generally detectable in upper respiratory specimens during the acute phase of infection. The lowest concentration of SARS-CoV-2 viral copies this assay can detect is 138 copies/mL. A negative result does not preclude SARS-Cov-2 infection and should not be used as the sole basis for treatment or other patient management decisions. A negative result may occur with  improper specimen collection/handling, submission of specimen other than nasopharyngeal swab, presence of viral mutation(s) within the areas targeted by this assay, and inadequate number of viral copies(<138 copies/mL). A negative result must be combined with clinical observations, patient history, and epidemiological information. The expected result is Negative.  Fact Sheet for Patients:  EntrepreneurPulse.com.au  Fact Sheet for Healthcare Providers:  IncredibleEmployment.be  This test is no t yet approved or cleared by the [place] and  has been authorized for detection and/or diagnosis of SARS-CoV-2 by FDA under an Emergency Use Authorization (EUA). This EUA will remain  in effect (meaning this test can be used) for the duration of the COVID-19 declaration under Section 564(b)(1) of the Act, 21 U.S.C.section 360bbb-3(b)(1), unless the authorization is terminated  or revoked sooner.       Influenza A by PCR NEGATIVE NEGATIVE Final   Influenza B by PCR NEGATIVE NEGATIVE Final    Comment: (NOTE) The Xpert Xpress SARS-CoV-2/FLU/RSV plus assay is intended as an aid in the diagnosis of influenza from  Nasopharyngeal swab specimens and should not be used as a sole basis for treatment. Nasal washings and aspirates are unacceptable for Xpert Xpress SARS-CoV-2/FLU/RSV testing.  Fact Sheet for Patients: EntrepreneurPulse.com.au  Fact Sheet for Healthcare Providers: IncredibleEmployment.be  This test is not yet approved or cleared by the [place] FDA and has been authorized for detection and/or diagnosis of SARS-CoV-2 by FDA under an Emergency Use Authorization (EUA). This EUA will remain in effect (meaning this test can be used) for the duration of the COVID-19 declaration under Section 564(b)(1) of the Act, 21 U.S.C. section 360bbb-3(b)(1), unless the authorization is terminated or revoked.  Performed at [facility], [address] [place],  [address]          Radiology Studies: MR ANKLE LEFT WO CONTRAST  Result Date: 04/21/2021 CLINICAL DATA:  L calc osteo with wound, severe EXAM: MRI OF THE LEFT ANKLE WITHOUT CONTRAST TECHNIQUE: Multiplanar, multisequence MR imaging of the ankle was performed. No intravenous contrast was administered. COMPARISON:  Foot radiograph 04/20/2021 FINDINGS: TENDONS Peroneal: There is tenosynovitis of the peroneal longus and brevis tendons above and below the malleolus. There is an accessory peroneal tendon inserting on the lateral calcaneus consistent with a peroneus quartus. There is continuity distally through its attachment on the base of the fifth metatarsal. Posteromedial: Tenosynovitis of the posterior tibial tendon below the malleolus at the level of the talar head/neck. Flexor digitorum longus tendon intact. Flexor hallucis longus tendon intact. Anterior: Tibialis anterior tendon intact. Extensor hallucis longus tendon intact Extensor digitorum longus tendon intact. Achilles:  Distal Achilles tendinosis and full-thickness tearing. Plantar Fascia: Disruption of the proximal medial and lateral  bundle plantar fascia attachment. LIGAMENTS Lateral: Anterior talofibular ligament intact. Calcaneofibular ligament intact. Posterior talofibular ligament intact. Anterior and posterior tibiofibular ligaments intact. Medial: Chronic deep deltoid sprain.  Spring ligament intact. CARTILAGE Ankle Joint: No joint effusion. Normal ankle mortise. No chondral defect. Subtalar Joints/Sinus Tarsi: Normal subtalar joints. No subtalar joint effusion. Normal sinus tarsi. Bones: There is bone destruction of the posterior calcaneus with extensive bony edema and confluent low T1 signal extending throughout the calcaneal body, with a mildly preserved fat in the most anterior portion, subchondral in the posterior subtalar facet, and in the sustentaculum tali. There is patchy bone marrow edema within the navicular, cuboid, middle and lateral cuneiforms, likely stress reaction. Soft Tissue: Heel soft tissue ulcer noted. Generalized soft tissue swelling of the foot and ankle including the intrinsic musculature of the foot. The origin of the quadratus plantae and abductor hallucis tendons are disrupted (coronal T2 images 27, 25). IMPRESSION: Extensive osteomyelitis of the calcaneus, with bony destruction of the posterior and plantar aspect. Disruption of the distal Achilles tendon and proximal medial/lateral bundle plantar fascia attachments. Disrupted origins of the quadratus plantae and abductor hallucis tendons. Adjacent soft tissue ulcer  with soft tissue inflammation but no well-defined abscess. Tenosynovitis of the peroneal longus and brevis tendons above and below the malleolus. Anatomic variant peroneus quartus is present. Tenosynovitis of the posterior tibial tendon below the malleolus at the level of the talar head/neck. Patchy bone marrow edema within the navicular, cuboid, middle and lateral cuneiforms, likely stress reaction. Electronically Signed   By: Maurine Simmering   On: 04/21/2021 10:37   DG Foot Complete Left  Result  Date: 04/20/2021 CLINICAL DATA:  Heel ulcer EXAM: LEFT FOOT - COMPLETE 3+ VIEW COMPARISON:  11/11/2020 FINDINGS: There is diffuse soft tissue swelling of the foot with lucency along the heel suggestive of heel ulcer. There is new bony destruction of the posterior calcaneus. Vascular calcifications. Prior second and third toe amputations and fifth ray amputation to the level of the mid metatarsal. Severe hallux valgus with bunion formation. IMPRESSION: Osteomyelitis of the posterior calcaneus with significant bony destruction. Adjacent soft tissue ulcer. Diffuse soft tissue swelling. Electronically Signed   By: Maurine Simmering   On: 04/20/2021 21:26        Scheduled Meds:  aspirin EC  81 mg Oral Daily   gabapentin  300 mg Oral TID   insulin aspart  0-20 Units Subcutaneous Q4H   metoprolol succinate  50 mg Oral Daily   mometasone-formoterol  2 puff Inhalation BID   traMADol  50 mg Oral QHS   Continuous Infusions:  [START ON 04/22/2021] sodium chloride     cefTRIAXone (ROCEPHIN)  IV     metronidazole     vancomycin       LOS: 1 day    Time spent: 35 minutes    Sidney Ace, MD [facility] Pager [phone]  If 7PM-7AM, please contact night-coverage 04/21/2021, 12:37 PM

## 2021-04-21 NOTE — Progress Notes (Signed)
Advance Directive Education completed

## 2021-04-21 NOTE — ED Notes (Signed)
Pt presents from his Drs office for re-evaluation of his left heel wound. Pt endorses pain with ambulation. Hx of DM, No other concerns at this time.

## 2021-04-21 NOTE — Consult Note (Addendum)
ORTHOPAEDIC CONSULTATION  REQUESTING PHYSICIAN: Sidney Ace, MD  Chief Complaint: Left heel ulceration  HPI: [name] is a 70 y.o. male who complains of worsening left heel ulceration.  He was seen in the outpatient clinic by myself yesterday.  I had previously been evaluating for a heel ulcer that had completely healed over as of June of this year.  He related worsening wound and foul odor that seem to progress fairly quickly over the last few weeks.  On presentation in the outpatient clinic he had a large necrotic heel ulceration probe directly down to bone with surrounding erythema and foul odor.  I immediately sent him to the ER for evaluation and further work-up.  Past Medical History:  Diagnosis Date   Cholecystitis, chronic    Cholelithiasis    COPD (chronic obstructive pulmonary disease) ([place])    Diabetes mellitus without complication ([place])    Diabetic retinopathy ([place])    Dyspnea    Fatty liver 2008   Fracture of clavicle 1992   left    Fracture of ribs, multiple 1992   3 ribs   GERD (gastroesophageal reflux disease)    Hyperlipidemia    Hypertension    Paroxysmal SVT (supraventricular tachycardia) ([facility])    Pelvis fracture ([place]) 1992   Peripheral vascular disease ([place])    Pleurisy    Pleurisy    Seasonal allergies    Past Surgical History:  Procedure Laterality Date   AMPUTATION TOE Left 02/25/2017   Procedure: AMPUTATION TOE-LEFT 2ND MPJ;  Surgeon: Samara Deist, DPM;  Location: [facility];  Service: Podiatry;  Laterality: Left;   AMPUTATION TOE Left 10/20/2018   Procedure: TOE MPJ T2 LEFT;  Surgeon: Samara Deist, DPM;  Location: [facility];  Service: Podiatry;  Laterality: Left;   AMPUTATION TOE Left 01/12/2019   Procedure: AMPUTATION LEFT 5TH TOE AND JOINT;  Surgeon: Samara Deist, DPM;  Location: [facility];  Service: Podiatry;  Laterality: Left;   BACK SURGERY  2008   BONE BIOPSY Left 11/21/2020   Procedure: BONE BIOPSY;  Surgeon: Samara Deist, DPM;   Location: [facility];  Service: Podiatry;  Laterality: Left;   CHOLECYSTECTOMY     COLONOSCOPY WITH PROPOFOL N/A 08/15/2017   Procedure: COLONOSCOPY WITH PROPOFOL;  Surgeon: Lollie Sails, MD;  Location: [facility];  Service: Endoscopy;  Laterality: N/A;   ENDARTERECTOMY Right 05/12/2017   Procedure: ENDARTERECTOMY CAROTID;  Surgeon: [name], MD;  Location: [facility];  Service: Vascular;  Laterality: Right;   ENDARTERECTOMY FEMORAL Left 12/01/2018   Procedure: ENDARTERECTOMY FEMORAL;  Surgeon: [name], MD;  Location: [facility];  Service: Vascular;  Laterality: Left;   ERCP     FOOT SURGERY Right    x 3   IRRIGATION AND DEBRIDEMENT FOOT Left 11/21/2020   Procedure: IRRIGATION AND DEBRIDEMENT FOOT--with placement of wound vac;  Surgeon: Samara Deist, DPM;  Location: [facility];  Service: Podiatry;  Laterality: Left;   LOWER EXTREMITY ANGIOGRAM Left 12/01/2018   Procedure: LOWER EXTREMITY ANGIOGRAM;  Surgeon: [name], MD;  Location: [facility];  Service: Vascular;  Laterality: Left;   LOWER EXTREMITY ANGIOGRAPHY Left 10/30/2018   Procedure: LOWER EXTREMITY ANGIOGRAPHY;  Surgeon: [name], MD;  Location: [place] [facility];  Service: Cardiovascular;  Laterality: Left;   LOWER EXTREMITY ANGIOGRAPHY Left 11/30/2018   Procedure: LOWER EXTREMITY ANGIOGRAPHY;  Surgeon: [name], MD;  Location: [place] [facility];  Service: Cardiovascular;  Laterality: Left;   LOWER EXTREMITY ANGIOGRAPHY Left  11/17/2020   Procedure: Lower Extremity Angiography;  Surgeon: [name], MD;  Location: [place] [facility];  Service: Cardiovascular;  Laterality: Left;   TEE WITHOUT CARDIOVERSION N/A 11/14/2020   Procedure: TRANSESOPHAGEAL ECHOCARDIOGRAM (TEE);  Surgeon: Corey Skains, MD;  Location: [facility];  Service: Cardiovascular;  Laterality: N/A;   Social History   Socioeconomic History   Marital status: Single    Spouse name: Not on file   Number of children: Not on file   Years of  education: Not on file   Highest education level: Not on file  Occupational History   Not on file  Tobacco Use   Smoking status: Former    Types: Cigarettes    Quit date: 02/24/1997    Years since quitting: 24.1   Smokeless tobacco: Never  Vaping Use   Vaping Use: Never used  Substance and Sexual Activity   Alcohol use: No   Drug use: No   Sexual activity: Not on file  Other Topics Concern   Not on file  Social History Narrative   Not on file   Social Determinants of Health   Financial Resource Strain: Low Risk    Difficulty of Paying Living Expenses: Not very hard  Food Insecurity: No Food Insecurity   Worried About Running Out of Food in the Last Year: Never true   Ran Out of Food in the Last Year: Never true  Transportation Needs: No Transportation Needs   Lack of Transportation (Medical): No   Lack of Transportation (Non-Medical): No  Physical Activity: Not on file  Stress: Not on file  Social Connections: Not on file   Family History  Problem Relation Age of Onset   Diabetes Sister    Allergies  Allergen Reactions   Penicillins Rash and Other (See Comments)    Rash in and around the mouth Has patient had a PCN reaction causing immediate rash, facial/tongue/throat swelling, SOB or lightheadedness with hypotension: Yes Has patient had a PCN reaction causing severe rash involving mucus membranes or skin necrosis: Yes Has patient had a PCN reaction that required hospitalization: No Has patient had a PCN reaction occurring within the last 10 years: Yes If all of the above answers are "NO", then may proceed with Cephalosporin use.    Bactrim [Sulfamethoxazole-Trimethoprim] Other (See Comments)    Mouth sores, Sores develop in mouth   Prior to Admission medications   Medication Sig Start Date End Date Taking? Authorizing Provider  acetaminophen (TYLENOL) 325 MG tablet Take 650 mg by mouth every 6 (six) hours as needed for moderate pain.   Yes [provider]  aspirin EC 81 MG tablet Take 81 mg by mouth daily.   Yes [provider]  Calcium Carbonate-Vitamin D (CALCIUM PLUS VITAMIN D PO) Take 1 tablet by mouth daily.   Yes [provider]  clopidogrel (PLAVIX) 75 MG tablet Take 1 tablet (75 mg total) by mouth daily. 12/01/20  Yes Kris Hartmann, NP  cyclobenzaprine (FLEXERIL) 10 MG tablet Take 1 tablet (10 mg total) by mouth 3 (three) times daily as needed for muscle spasms. 03/17/21  Yes [name], MD  diphenhydrAMINE (BENADRYL) 25 mg capsule Take 25 mg by mouth every 6 (six) hours as needed for allergies.   Yes [provider]  gabapentin (NEURONTIN) 300 MG capsule Take 1 capsule (300 mg total) by mouth 3 (three) times daily. 11/28/20  Yes [name], MD  glyBURIDE (DIABETA) 5 MG tablet Take 1 tablet (  5 mg total) by mouth 2 (two) times daily with a meal. 11/28/20  Yes [name], MD  lovastatin (MEVACOR) 10 MG tablet Take 1 tab  Po QHS 03/17/21  Yes [name], MD  meloxicam (MOBIC) 15 MG tablet Take 15 mg by mouth daily. 03/09/11  Yes [provider]  metoprolol succinate (TOPROL-XL) 50 MG 24 hr tablet Take 1 tablet (50 mg total) by mouth daily. Take with or immediately following a meal. 03/17/21  Yes [name], MD  mometasone-formoterol [facility]) 100-5 MCG/ACT AERO Inhale 2 puffs into the lungs 2 (two) times daily as needed for wheezing. 02/05/21  Yes [name], MD  traMADol (ULTRAM) 50 MG tablet TAKE 1 TABLET BY MOUTH EVERY 12 HOURS ASNEEDED FOR PAIN 01/28/21  Yes Luiz Ochoa, NP  Alcohol Swabs (B-D SINGLE USE SWABS REGULAR) PADS by Does not apply route. Use as directed    [provider]  Blood Glucose Calibration (TRUE METRIX LEVEL 1) Low SOLN Use as directed when needed dx E11.65 03/17/21   [name], MD  Blood Glucose Monitoring Suppl (TRUE METRIX GO GLUCOSE METER) w/Device KIT Use as directed to check glucose DX e11.65 03/17/21   [name], MD  Continuous Blood Gluc Receiver  (DEXCOM G6 RECEIVER) DEVI Use as directed every 14 days to monitor blood sugars 04/02/21   [name], MD  Continuous Blood Gluc Sensor (DEXCOM G6 SENSOR) MISC Use as directed every 14 days to monitor blood sugars 04/02/21   [name], MD  Continuous Blood Gluc Transmit (DEXCOM G6 TRANSMITTER) MISC Use as directed every 14 days to monitor blood sugars 04/02/21   [name], MD  glucose blood (TRUE METRIX BLOOD GLUCOSE TEST) test strip Use as directed twice dailyDx E11.65 03/17/21   [name], MD  Lancets (ACCU-CHEK SOFT [facility]) lancets Use as instructed 06/15/19   Ronnell Freshwater, NP  pantoprazole (PROTONIX) 40 MG tablet Take 1 tablet (40 mg total) by mouth daily. Patient not taking: Reported on 04/21/2021 04/02/21   Mylinda Latina, PA-C  TRUEplus Lancets 30G MISC by Does not apply route. Use as directed twice a daily Dx E11.65    [provider]   MR ANKLE LEFT WO CONTRAST  Result Date: 04/21/2021 CLINICAL DATA:  L calc osteo with wound, severe EXAM: MRI OF THE LEFT ANKLE WITHOUT CONTRAST TECHNIQUE: Multiplanar, multisequence MR imaging of the ankle was performed. No intravenous contrast was administered. COMPARISON:  Foot radiograph 04/20/2021 FINDINGS: TENDONS Peroneal: There is tenosynovitis of the peroneal longus and brevis tendons above and below the malleolus. There is an accessory peroneal tendon inserting on the lateral calcaneus consistent with a peroneus quartus. There is continuity distally through its attachment on the base of the fifth metatarsal. Posteromedial: Tenosynovitis of the posterior tibial tendon below the malleolus at the level of the talar head/neck. Flexor digitorum longus tendon intact. Flexor hallucis longus tendon intact. Anterior: Tibialis anterior tendon intact. Extensor hallucis longus tendon intact Extensor digitorum longus tendon intact. Achilles:  Distal Achilles tendinosis and full-thickness tearing. Plantar Fascia: Disruption of the proximal medial  and lateral bundle plantar fascia attachment. LIGAMENTS Lateral: Anterior talofibular ligament intact. Calcaneofibular ligament intact. Posterior talofibular ligament intact. Anterior and posterior tibiofibular ligaments intact. Medial: Chronic deep deltoid sprain.  Spring ligament intact. CARTILAGE Ankle Joint: No joint effusion. Normal ankle mortise. No chondral defect. Subtalar Joints/Sinus Tarsi: Normal subtalar joints. No subtalar joint effusion. Normal sinus tarsi. Bones: There is bone  destruction of the posterior calcaneus with extensive bony edema and confluent low T1 signal extending throughout the calcaneal body, with a mildly preserved fat in the most anterior portion, subchondral in the posterior subtalar facet, and in the sustentaculum tali. There is patchy bone marrow edema within the navicular, cuboid, middle and lateral cuneiforms, likely stress reaction. Soft Tissue: Heel soft tissue ulcer noted. Generalized soft tissue swelling of the foot and ankle including the intrinsic musculature of the foot. The origin of the quadratus plantae and abductor hallucis tendons are disrupted (coronal T2 images 27, 25). IMPRESSION: Extensive osteomyelitis of the calcaneus, with bony destruction of the posterior and plantar aspect. Disruption of the distal Achilles tendon and proximal medial/lateral bundle plantar fascia attachments. Disrupted origins of the quadratus plantae and abductor hallucis tendons. Adjacent soft tissue ulcer with soft tissue inflammation but no well-defined abscess. Tenosynovitis of the peroneal longus and brevis tendons above and below the malleolus. Anatomic variant peroneus quartus is present. Tenosynovitis of the posterior tibial tendon below the malleolus at the level of the talar head/neck. Patchy bone marrow edema within the navicular, cuboid, middle and lateral cuneiforms, likely stress reaction. Electronically Signed   By: Maurine Simmering   On: 04/21/2021 10:37   DG Foot Complete  Left  Result Date: 04/20/2021 CLINICAL DATA:  Heel ulcer EXAM: LEFT FOOT - COMPLETE 3+ VIEW COMPARISON:  11/11/2020 FINDINGS: There is diffuse soft tissue swelling of the foot with lucency along the heel suggestive of heel ulcer. There is new bony destruction of the posterior calcaneus. Vascular calcifications. Prior second and third toe amputations and fifth ray amputation to the level of the mid metatarsal. Severe hallux valgus with bunion formation. IMPRESSION: Osteomyelitis of the posterior calcaneus with significant bony destruction. Adjacent soft tissue ulcer. Diffuse soft tissue swelling. Electronically Signed   By: Maurine Simmering   On: 04/20/2021 21:26    Positive ROS: All other systems have been reviewed and were otherwise negative with the exception of those mentioned in the HPI and as above.  12 point ROS was performed.  Physical Exam: General: Alert and oriented.  No apparent distress.  Vascular:  Left foot: Dorsalis pedis and posterior tibial pulses are absent  Right foot: Dorsalis pedis and posterior tibial pulses are absent  Neuro:absent protective sensation  Derm: Large necrotic heel ulceration probes directly down to bone to the lateral aspect of the heel with surrounding erythema and foul odor.  Ortho/MS: Mild edema to the lateral aspect of the left foot and ankle.  Assessment:  Osteomyelitis diffuse left heel with large necrotic heel ulceration Diabetes with neuropathy with peripheral vascular disease  Plan: At this point unfortunately this is not a salvageable foot.  He has severe diffuse osteomyelitis throughout the entire body of the calcaneus based on MRI.  Large defect the posterior aspect of the calcaneus is noted on x-ray.  I had a long discussion with him in regards to the amount of infection he has in his heel.  I discussed my recommendation is for amputation of the lower leg as there is no attempt to remove the body of the calcaneus and have a functional foot.  He  expressed understanding.  Vascular surgery has been consulted on this already.  At this time will defer further treatment to vascular surgery going forward.    Elesa Hacker, DPM Cell [phone]   04/21/2021 12:57 PM

## 2021-04-21 NOTE — H&P (View-Only) (Signed)
[facility] Vascular Consult Note  MRN : [id number]  [name] is a 70 y.o. (July 20, 1951) male who presents with chief complaint of  Chief Complaint  Patient presents with   Wound Infection   History of Present Illness:  [name] is a 70 year old male with medical history significant for DM, HTN, PAD s/p prior amputations digits left foot who presents to the emergency room a for evaluation of a nonhealing painful wound of the left heel which he said he noticed just a week prior.    Has a history of osteomyelitis of the left foot in the past.  He denies fever and chills and denies shortness of breath or chest pain.    ED course:  On arrival, afebrile, BP 100/79 pulse 87 with O2 sat 97% on room air. Blood work with normal WBC but elevated lactic acid of 2.2.  Hemoglobin 9.6, down from baseline of 10.1.  CMP with sodium of 130, glucose 343 but otherwise unremarkable.   Imaging: Left foot x-ray with osteomyelitis of posterior calcaneus with significant bony destruction.  Adjacent soft tissue ulcer.  Diffuse soft tissue swelling   Patient started on vancomycin and admitted to the hospitalist service.   Vascular Surgery was consulted by Dr. Damita Dunnings for possible intervention.  Current Facility-Administered Medications  Medication Dose Route Frequency Provider Last Rate Last Admin   acetaminophen (TYLENOL) tablet 650 mg  650 mg Oral Q6H PRN Athena Masse, MD       Or   acetaminophen (TYLENOL) suppository 650 mg  650 mg Rectal Q6H PRN Athena Masse, MD       aspirin EC tablet 81 mg  81 mg Oral Daily Sreenath, Sudheer B, MD       cefTRIAXone (ROCEPHIN) 2 g in sodium chloride 0.9 % 100 mL IVPB  2 g Intravenous Q24H Sreenath, Sudheer B, MD       cyclobenzaprine (FLEXERIL) tablet 10 mg  10 mg Oral TID PRN Ralene Muskrat B, MD       diphenhydrAMINE (BENADRYL) capsule 25 mg  25 mg Oral Q6H PRN Sreenath, Sudheer B, MD       gabapentin (NEURONTIN) capsule 300  mg  300 mg Oral TID Ralene Muskrat B, MD       HYDROcodone-acetaminophen (NORCO/VICODIN) 5-325 MG per tablet 1-2 tablet  1-2 tablet Oral Q4H PRN Athena Masse, MD       insulin aspart (novoLOG) injection 0-20 Units  0-20 Units Subcutaneous Q4H Athena Masse, MD   7 Units at 04/21/21 0011   lactated ringers infusion   Intravenous Continuous Ralene Muskrat B, MD 75 mL/hr at 04/21/21 0931 Rate Change at 04/21/21 0931   metoprolol succinate (TOPROL-XL) 24 hr tablet 50 mg  50 mg Oral Daily Sreenath, Sudheer B, MD       metroNIDAZOLE (FLAGYL) IVPB 500 mg  500 mg Intravenous Q8H Sreenath, Sudheer B, MD       mometasone-formoterol (DULERA) 100-5 MCG/ACT inhaler 2 puff  2 puff Inhalation BID Sreenath, Sudheer B, MD       morphine 2 MG/ML injection 2 mg  2 mg Intravenous Q2H PRN Athena Masse, MD   2 mg at 04/21/21 0408   ondansetron (ZOFRAN) tablet 4 mg  4 mg Oral Q6H PRN Athena Masse, MD       Or   ondansetron [facility]) injection 4 mg  4 mg Intravenous Q6H PRN Athena Masse, MD  traMADol (ULTRAM) tablet 50 mg  50 mg Oral QHS Sreenath, Sudheer B, MD       vancomycin (VANCOREADY) IVPB 1500 mg/300 mL  1,500 mg Intravenous Q24H Shanlever, Charmayne Sheer, [facility]       Current Outpatient Medications  Medication Sig Dispense Refill   acetaminophen (TYLENOL) 325 MG tablet Take 650 mg by mouth every 6 (six) hours as needed for moderate pain.     aspirin EC 81 MG tablet Take 81 mg by mouth daily.     Calcium Carbonate-Vitamin D (CALCIUM PLUS VITAMIN D PO) Take 1 tablet by mouth daily.     clopidogrel (PLAVIX) 75 MG tablet Take 1 tablet (75 mg total) by mouth daily. 90 tablet 3   cyclobenzaprine (FLEXERIL) 10 MG tablet Take 1 tablet (10 mg total) by mouth 3 (three) times daily as needed for muscle spasms. 270 tablet 0   diphenhydrAMINE (BENADRYL) 25 mg capsule Take 25 mg by mouth every 6 (six) hours as needed for allergies.     gabapentin (NEURONTIN) 300 MG capsule Take 1 capsule (300 mg total) by  mouth 3 (three) times daily. 90 capsule 1   glyBURIDE (DIABETA) 5 MG tablet Take 1 tablet (5 mg total) by mouth 2 (two) times daily with a meal. 180 tablet 1   lovastatin (MEVACOR) 10 MG tablet Take 1 tab  Po QHS 90 tablet 1   meloxicam (MOBIC) 15 MG tablet Take 15 mg by mouth daily.     metoprolol succinate (TOPROL-XL) 50 MG 24 hr tablet Take 1 tablet (50 mg total) by mouth daily. Take with or immediately following a meal. 90 tablet 1   mometasone-formoterol (DULERA) 100-5 MCG/ACT AERO Inhale 2 puffs into the lungs 2 (two) times daily as needed for wheezing. 1 each 3   traMADol (ULTRAM) 50 MG tablet TAKE 1 TABLET BY MOUTH EVERY 12 HOURS ASNEEDED FOR PAIN 45 tablet 1   Alcohol Swabs (B-D SINGLE USE SWABS REGULAR) PADS by Does not apply route. Use as directed     Blood Glucose Calibration (TRUE METRIX LEVEL 1) Low SOLN Use as directed when needed dx E11.65 1 each 0   Blood Glucose Monitoring Suppl (TRUE METRIX GO GLUCOSE METER) w/Device KIT Use as directed to check glucose DX e11.65 1 kit 0   Continuous Blood Gluc Receiver (DEXCOM G6 RECEIVER) DEVI Use as directed every 14 days to monitor blood sugars 3 each 3   Continuous Blood Gluc Sensor (DEXCOM G6 SENSOR) MISC Use as directed every 14 days to monitor blood sugars 3 each 3   Continuous Blood Gluc Transmit (DEXCOM G6 TRANSMITTER) MISC Use as directed every 14 days to monitor blood sugars 3 each 3   glucose blood (TRUE METRIX BLOOD GLUCOSE TEST) test strip Use as directed twice dailyDx E11.65 100 each 1   Lancets (ACCU-CHEK SOFT TOUCH) lancets Use as instructed 100 each 12   pantoprazole (PROTONIX) 40 MG tablet Take 1 tablet (40 mg total) by mouth daily. (Patient not taking: Reported on 04/21/2021) 90 tablet 2   TRUEplus Lancets 30G MISC by Does not apply route. Use as directed twice a daily Dx E11.65     Past Medical History:  Diagnosis Date   Cholecystitis, chronic    Cholelithiasis    COPD (chronic obstructive pulmonary disease) (HCC)     Diabetes mellitus without complication (HCC)    Diabetic retinopathy (HCC)    Dyspnea    Fatty liver 2008   Fracture of clavicle 1992   left  Fracture of ribs, multiple 1992   3 ribs   GERD (gastroesophageal reflux disease)    Hyperlipidemia    Hypertension    Paroxysmal SVT (supraventricular tachycardia) (HCC)    Pelvis fracture ([place]) 1992   Peripheral vascular disease ([place])    Pleurisy    Pleurisy    Seasonal allergies    Past Surgical History:  Procedure Laterality Date   AMPUTATION TOE Left 02/25/2017   Procedure: AMPUTATION TOE-LEFT 2ND MPJ;  Surgeon: Samara Deist, DPM;  Location: [facility];  Service: Podiatry;  Laterality: Left;   AMPUTATION TOE Left 10/20/2018   Procedure: TOE MPJ T2 LEFT;  Surgeon: Samara Deist, DPM;  Location: [facility];  Service: Podiatry;  Laterality: Left;   AMPUTATION TOE Left 01/12/2019   Procedure: AMPUTATION LEFT 5TH TOE AND JOINT;  Surgeon: Samara Deist, DPM;  Location: [facility];  Service: Podiatry;  Laterality: Left;   BACK SURGERY  2008   BONE BIOPSY Left 11/21/2020   Procedure: BONE BIOPSY;  Surgeon: Samara Deist, DPM;  Location: [facility];  Service: Podiatry;  Laterality: Left;   CHOLECYSTECTOMY     COLONOSCOPY WITH PROPOFOL N/A 08/15/2017   Procedure: COLONOSCOPY WITH PROPOFOL;  Surgeon: Lollie Sails, MD;  Location: [facility];  Service: Endoscopy;  Laterality: N/A;   ENDARTERECTOMY Right 05/12/2017   Procedure: ENDARTERECTOMY CAROTID;  Surgeon: [name], MD;  Location: [facility];  Service: Vascular;  Laterality: Right;   ENDARTERECTOMY FEMORAL Left 12/01/2018   Procedure: ENDARTERECTOMY FEMORAL;  Surgeon: [name], MD;  Location: [facility];  Service: Vascular;  Laterality: Left;   ERCP     FOOT SURGERY Right    x 3   IRRIGATION AND DEBRIDEMENT FOOT Left 11/21/2020   Procedure: IRRIGATION AND DEBRIDEMENT FOOT--with placement of wound vac;  Surgeon: Samara Deist, DPM;  Location: [facility];  Service: Podiatry;  Laterality:  Left;   LOWER EXTREMITY ANGIOGRAM Left 12/01/2018   Procedure: LOWER EXTREMITY ANGIOGRAM;  Surgeon: [name], MD;  Location: [facility];  Service: Vascular;  Laterality: Left;   LOWER EXTREMITY ANGIOGRAPHY Left 10/30/2018   Procedure: LOWER EXTREMITY ANGIOGRAPHY;  Surgeon: [name], MD;  Location: [place] [facility];  Service: Cardiovascular;  Laterality: Left;   LOWER EXTREMITY ANGIOGRAPHY Left 11/30/2018   Procedure: LOWER EXTREMITY ANGIOGRAPHY;  Surgeon: [name], MD;  Location: [place] [facility];  Service: Cardiovascular;  Laterality: Left;   LOWER EXTREMITY ANGIOGRAPHY Left 11/17/2020   Procedure: Lower Extremity Angiography;  Surgeon: [name], MD;  Location: [place] [facility];  Service: Cardiovascular;  Laterality: Left;   TEE WITHOUT CARDIOVERSION N/A 11/14/2020   Procedure: TRANSESOPHAGEAL ECHOCARDIOGRAM (TEE);  Surgeon: Corey Skains, MD;  Location: [facility];  Service: Cardiovascular;  Laterality: N/A;   Social History Social History   Tobacco Use   Smoking status: Former    Types: Cigarettes    Quit date: 02/24/1997    Years since quitting: 24.1   Smokeless tobacco: Never  Vaping Use   Vaping Use: Never used  Substance Use Topics   Alcohol use: No   Drug use: No   Family History Family History  Problem Relation Age of Onset   Diabetes Sister   Denies family hx of PAD, venous or renal disease.  Allergies  Allergen Reactions   Penicillins Rash and Other (See Comments)    Rash in and around the mouth Has patient had a PCN reaction causing immediate rash, facial/tongue/throat swelling, SOB or lightheadedness with hypotension: Yes  Has patient had a PCN reaction causing severe rash involving mucus membranes or skin necrosis: Yes Has patient had a PCN reaction that required hospitalization: No Has patient had a PCN reaction occurring within the last 10 years: Yes If all of the above answers are "NO", then may proceed with Cephalosporin use.    Bactrim  [Sulfamethoxazole-Trimethoprim] Other (See Comments)    Mouth sores, Sores develop in mouth   REVIEW OF SYSTEMS (Negative unless checked)  Constitutional: [] Weight loss  [] Fever  [] Chills Cardiac: [] Chest pain   [] Chest pressure   [] Palpitations   [] Shortness of breath when laying flat   [] Shortness of breath at rest   [] Shortness of breath with exertion. Vascular:  [] Pain in legs with walking   [] Pain in legs at rest   [] Pain in legs when laying flat   [] Claudication   [x] Pain in feet when walking  [x] Pain in feet at rest  [] Pain in feet when laying flat   [] History of DVT   [] Phlebitis   [] Swelling in legs   [] Varicose veins   [] Non-healing ulcers Pulmonary:   [] Uses home oxygen   [] Productive cough   [] Hemoptysis   [] Wheeze  [] COPD   [] Asthma Neurologic:  [] Dizziness  [] Blackouts   [] Seizures   [] History of stroke   [] History of TIA  [] Aphasia   [] Temporary blindness   [] Dysphagia   [] Weakness or numbness in arms   [] Weakness or numbness in legs Musculoskeletal:  [] Arthritis   [] Joint swelling   [] Joint pain   [] Low back pain Hematologic:  [] Easy bruising  [] Easy bleeding   [] Hypercoagulable state   [] Anemic  [] Hepatitis Gastrointestinal:  [] Blood in stool   [] Vomiting blood  [] Gastroesophageal reflux/heartburn   [] Difficulty swallowing. Genitourinary:  [] Chronic kidney disease   [] Difficult urination  [] Frequent urination  [] Burning with urination   [] Blood in urine Skin:  [] Rashes   [x] Ulcers   [x] Wounds Psychological:  [] History of anxiety   []  History of major depression.  Physical Examination  Vitals:   04/21/21 0430 04/21/21 0630 04/21/21 0931 04/21/21 1027  BP: 122/66 121/72 112/71 129/68  Pulse: 96 89 69 72  Resp: 18 18 18 16   Temp:   98.4 F (36.9 C)   TempSrc:   Oral   SpO2: 99% 99% 99% 95%  Weight:      Height:       Body mass index is 22.97 kg/m. Gen:  WD/WN, NAD Head: [place]/AT, No temporalis wasting. Prominent temp pulse not noted. Ear/Nose/Throat: Hearing grossly  intact, nares w/o erythema or drainage, oropharynx w/o Erythema/Exudate Eyes: Sclera non-icteric, conjunctiva clear Neck: Trachea midline.  No JVD.  Pulmonary:  Good air movement, respirations not labored, equal bilaterally.  Cardiac: RRR, normal S1, S2. Vascular:  Vessel Right Left  Radial Palpable Palpable  Ulnar Palpable Palpable  Brachial Palpable Palpable  Carotid Palpable, without bruit Palpable, without bruit  Aorta Not palpable N/A  Femoral Palpable Palpable  Popliteal Palpable Palpable  PT Non-Palpable Non-Palpable  DP Non-Palpable Non-Palpable   Left Lower Extremity: Thigh soft. Calf soft. Heel ulceration with foul smell. Hard to palpate pedal pulses.   Gastrointestinal: soft, non-tender/non-distended. No guarding/reflex.  Musculoskeletal: M/S 5/5 throughout.  Extremities without ischemic changes.  No deformity or atrophy. No edema. Neurologic: Sensation grossly intact in extremities.  Symmetrical.  Speech is fluent. Motor exam as listed above. Psychiatric: Judgment intact, Mood & affect appropriate for pt's clinical situation. Dermatologic: Heel ulceration Lymph : No Cervical, Axillary, or Inguinal lymphadenopathy.  CBC Lab Results  Component  Value Date   WBC 6.7 04/21/2021   HGB 8.4 (L) 04/21/2021   HCT 26.6 (L) 04/21/2021   MCV 81.8 04/21/2021   PLT 219 04/21/2021   BMET    Component Value Date/Time   NA 132 (L) 04/21/2021 0625   NA 136 04/17/2014 1026   K 4.1 04/21/2021 0625   K 5.1 04/17/2014 1026   CL 98 04/21/2021 0625   CL 103 04/17/2014 1026   CO2 25 04/21/2021 0625   CO2 28 04/17/2014 1026   GLUCOSE 169 (H) 04/21/2021 0625   GLUCOSE 124 (H) 04/17/2014 1026   BUN 15 04/21/2021 0625   BUN 28 (H) 04/17/2014 1026   CREATININE 0.90 04/21/2021 0625   CREATININE 1.30 04/17/2014 1026   CALCIUM 8.5 (L) 04/21/2021 0625   CALCIUM 8.6 04/17/2014 1026   GFRNONAA >60 04/21/2021 0625   GFRNONAA 59 (L) 04/17/2014 1026   GFRAA >60 06/20/2020 1001   GFRAA  >60 04/17/2014 1026   Estimated Creatinine Clearance: 81.8 mL/min (by C-G formula based on SCr of 0.9 mg/dL).  COAG Lab Results  Component Value Date   INR 1.2 11/12/2020   INR 1.0 12/01/2018   INR 0.97 05/05/2017   Radiology MR ANKLE LEFT WO CONTRAST  Result Date: 04/21/2021 CLINICAL DATA:  L calc osteo with wound, severe EXAM: MRI OF THE LEFT ANKLE WITHOUT CONTRAST TECHNIQUE: Multiplanar, multisequence MR imaging of the ankle was performed. No intravenous contrast was administered. COMPARISON:  Foot radiograph 04/20/2021 FINDINGS: TENDONS Peroneal: There is tenosynovitis of the peroneal longus and brevis tendons above and below the malleolus. There is an accessory peroneal tendon inserting on the lateral calcaneus consistent with a peroneus quartus. There is continuity distally through its attachment on the base of the fifth metatarsal. Posteromedial: Tenosynovitis of the posterior tibial tendon below the malleolus at the level of the talar head/neck. Flexor digitorum longus tendon intact. Flexor hallucis longus tendon intact. Anterior: Tibialis anterior tendon intact. Extensor hallucis longus tendon intact Extensor digitorum longus tendon intact. Achilles:  Distal Achilles tendinosis and full-thickness tearing. Plantar Fascia: Disruption of the proximal medial and lateral bundle plantar fascia attachment. LIGAMENTS Lateral: Anterior talofibular ligament intact. Calcaneofibular ligament intact. Posterior talofibular ligament intact. Anterior and posterior tibiofibular ligaments intact. Medial: Chronic deep deltoid sprain.  Spring ligament intact. CARTILAGE Ankle Joint: No joint effusion. Normal ankle mortise. No chondral defect. Subtalar Joints/Sinus Tarsi: Normal subtalar joints. No subtalar joint effusion. Normal sinus tarsi. Bones: There is bone destruction of the posterior calcaneus with extensive bony edema and confluent low T1 signal extending throughout the calcaneal body, with a mildly  preserved fat in the most anterior portion, subchondral in the posterior subtalar facet, and in the sustentaculum tali. There is patchy bone marrow edema within the navicular, cuboid, middle and lateral cuneiforms, likely stress reaction. Soft Tissue: Heel soft tissue ulcer noted. Generalized soft tissue swelling of the foot and ankle including the intrinsic musculature of the foot. The origin of the quadratus plantae and abductor hallucis tendons are disrupted (coronal T2 images 27, 25). IMPRESSION: Extensive osteomyelitis of the calcaneus, with bony destruction of the posterior and plantar aspect. Disruption of the distal Achilles tendon and proximal medial/lateral bundle plantar fascia attachments. Disrupted origins of the quadratus plantae and abductor hallucis tendons. Adjacent soft tissue ulcer with soft tissue inflammation but no well-defined abscess. Tenosynovitis of the peroneal longus and brevis tendons above and below the malleolus. Anatomic variant peroneus quartus is present. Tenosynovitis of the posterior tibial tendon below the malleolus at the level  of the talar head/neck. Patchy bone marrow edema within the navicular, cuboid, middle and lateral cuneiforms, likely stress reaction. Electronically Signed   By: Maurine Simmering   On: 04/21/2021 10:37   DG Foot Complete Left  Result Date: 04/20/2021 CLINICAL DATA:  Heel ulcer EXAM: LEFT FOOT - COMPLETE 3+ VIEW COMPARISON:  11/11/2020 FINDINGS: There is diffuse soft tissue swelling of the foot with lucency along the heel suggestive of heel ulcer. There is new bony destruction of the posterior calcaneus. Vascular calcifications. Prior second and third toe amputations and fifth ray amputation to the level of the mid metatarsal. Severe hallux valgus with bunion formation. IMPRESSION: Osteomyelitis of the posterior calcaneus with significant bony destruction. Adjacent soft tissue ulcer. Diffuse soft tissue swelling. Electronically Signed   By: Maurine Simmering    On: 04/20/2021 21:26    Assessment/Plan The patient is a 70 year old male with multiple medical issues including known PAD requiring endovascular intervention who presents with worsening left heel wound  1. Atherosclerotic Disease With Ulceration: Patient with non-healing and recently worsening wound to the left heel. Known PAD requiring endovascular / open intervention in the past. Hard to palpate pedal pulses. Recommend left lower extremity angiogram with possible intervention in an attempt to assess the patients anatomy and contributing degree of atherosclerotic disease. If appropriate, an attempt to revascularize the leg came be made at that time. Procedure, risks and benefits were explained to the patient. All questions were answered. Patient wishes to proceed. Will plan on intervention tomorrow AM.   2. Diabetes: Most likely uncontrolled. Encouraged good control as its slows the progression of atherosclerotic disease.   3. Hyperlipidemia:  On ASA, plavix and statin for medical management. Encouraged good control as its slows the progression of atherosclerotic disease.   Discussed with Dr. Mayme Genta, PA-C  04/21/2021 10:40 AM  This note was created with Dragon medical transcription system.  Any error is purely unintentional.

## 2021-04-21 NOTE — Consult Note (Signed)
[facility] Vascular Consult Note  MRN : [id number]  [name] is a 70 y.o. (06-03-1951) male who presents with chief complaint of  Chief Complaint  Patient presents with   Wound Infection   History of Present Illness:  [name] is a 70 year old male with medical history significant for DM, HTN, PAD s/p prior amputations digits left foot who presents to the emergency room a for evaluation of a nonhealing painful wound of the left heel which he said he noticed just a week prior.    Has a history of osteomyelitis of the left foot in the past.  He denies fever and chills and denies shortness of breath or chest pain.    ED course:  On arrival, afebrile, BP 100/79 pulse 87 with O2 sat 97% on room air. Blood work with normal WBC but elevated lactic acid of 2.2.  Hemoglobin 9.6, down from baseline of 10.1.  CMP with sodium of 130, glucose 343 but otherwise unremarkable.   Imaging: Left foot x-ray with osteomyelitis of posterior calcaneus with significant bony destruction.  Adjacent soft tissue ulcer.  Diffuse soft tissue swelling   Patient started on vancomycin and admitted to the hospitalist service.   Vascular Surgery was consulted by Dr. Damita Dunnings for possible intervention.  Current Facility-Administered Medications  Medication Dose Route Frequency Provider Last Rate Last Admin   acetaminophen (TYLENOL) tablet 650 mg  650 mg Oral Q6H PRN Athena Masse, MD       Or   acetaminophen (TYLENOL) suppository 650 mg  650 mg Rectal Q6H PRN Athena Masse, MD       aspirin EC tablet 81 mg  81 mg Oral Daily Sreenath, Sudheer B, MD       cefTRIAXone (ROCEPHIN) 2 g in sodium chloride 0.9 % 100 mL IVPB  2 g Intravenous Q24H Sreenath, Sudheer B, MD       cyclobenzaprine (FLEXERIL) tablet 10 mg  10 mg Oral TID PRN Ralene Muskrat B, MD       diphenhydrAMINE (BENADRYL) capsule 25 mg  25 mg Oral Q6H PRN Sreenath, Sudheer B, MD       gabapentin (NEURONTIN) capsule 300  mg  300 mg Oral TID Ralene Muskrat B, MD       HYDROcodone-acetaminophen (NORCO/VICODIN) 5-325 MG per tablet 1-2 tablet  1-2 tablet Oral Q4H PRN Athena Masse, MD       insulin aspart (novoLOG) injection 0-20 Units  0-20 Units Subcutaneous Q4H Athena Masse, MD   7 Units at 04/21/21 0011   lactated ringers infusion   Intravenous Continuous Ralene Muskrat B, MD 75 mL/hr at 04/21/21 0931 Rate Change at 04/21/21 0931   metoprolol succinate (TOPROL-XL) 24 hr tablet 50 mg  50 mg Oral Daily Sreenath, Sudheer B, MD       metroNIDAZOLE (FLAGYL) IVPB 500 mg  500 mg Intravenous Q8H Sreenath, Sudheer B, MD       mometasone-formoterol (DULERA) 100-5 MCG/ACT inhaler 2 puff  2 puff Inhalation BID Sreenath, Sudheer B, MD       morphine 2 MG/ML injection 2 mg  2 mg Intravenous Q2H PRN Athena Masse, MD   2 mg at 04/21/21 0408   ondansetron (ZOFRAN) tablet 4 mg  4 mg Oral Q6H PRN Athena Masse, MD       Or   ondansetron [facility]) injection 4 mg  4 mg Intravenous Q6H PRN Athena Masse, MD  traMADol (ULTRAM) tablet 50 mg  50 mg Oral QHS Sreenath, Sudheer B, MD       vancomycin (VANCOREADY) IVPB 1500 mg/300 mL  1,500 mg Intravenous Q24H Shanlever, Charmayne Sheer, [facility]       Current Outpatient Medications  Medication Sig Dispense Refill   acetaminophen (TYLENOL) 325 MG tablet Take 650 mg by mouth every 6 (six) hours as needed for moderate pain.     aspirin EC 81 MG tablet Take 81 mg by mouth daily.     Calcium Carbonate-Vitamin D (CALCIUM PLUS VITAMIN D PO) Take 1 tablet by mouth daily.     clopidogrel (PLAVIX) 75 MG tablet Take 1 tablet (75 mg total) by mouth daily. 90 tablet 3   cyclobenzaprine (FLEXERIL) 10 MG tablet Take 1 tablet (10 mg total) by mouth 3 (three) times daily as needed for muscle spasms. 270 tablet 0   diphenhydrAMINE (BENADRYL) 25 mg capsule Take 25 mg by mouth every 6 (six) hours as needed for allergies.     gabapentin (NEURONTIN) 300 MG capsule Take 1 capsule (300 mg total) by  mouth 3 (three) times daily. 90 capsule 1   glyBURIDE (DIABETA) 5 MG tablet Take 1 tablet (5 mg total) by mouth 2 (two) times daily with a meal. 180 tablet 1   lovastatin (MEVACOR) 10 MG tablet Take 1 tab  Po QHS 90 tablet 1   meloxicam (MOBIC) 15 MG tablet Take 15 mg by mouth daily.     metoprolol succinate (TOPROL-XL) 50 MG 24 hr tablet Take 1 tablet (50 mg total) by mouth daily. Take with or immediately following a meal. 90 tablet 1   mometasone-formoterol (DULERA) 100-5 MCG/ACT AERO Inhale 2 puffs into the lungs 2 (two) times daily as needed for wheezing. 1 each 3   traMADol (ULTRAM) 50 MG tablet TAKE 1 TABLET BY MOUTH EVERY 12 HOURS ASNEEDED FOR PAIN 45 tablet 1   Alcohol Swabs (B-D SINGLE USE SWABS REGULAR) PADS by Does not apply route. Use as directed     Blood Glucose Calibration (TRUE METRIX LEVEL 1) Low SOLN Use as directed when needed dx E11.65 1 each 0   Blood Glucose Monitoring Suppl (TRUE METRIX GO GLUCOSE METER) w/Device KIT Use as directed to check glucose DX e11.65 1 kit 0   Continuous Blood Gluc Receiver (DEXCOM G6 RECEIVER) DEVI Use as directed every 14 days to monitor blood sugars 3 each 3   Continuous Blood Gluc Sensor (DEXCOM G6 SENSOR) MISC Use as directed every 14 days to monitor blood sugars 3 each 3   Continuous Blood Gluc Transmit (DEXCOM G6 TRANSMITTER) MISC Use as directed every 14 days to monitor blood sugars 3 each 3   glucose blood (TRUE METRIX BLOOD GLUCOSE TEST) test strip Use as directed twice dailyDx E11.65 100 each 1   Lancets (ACCU-CHEK SOFT TOUCH) lancets Use as instructed 100 each 12   pantoprazole (PROTONIX) 40 MG tablet Take 1 tablet (40 mg total) by mouth daily. (Patient not taking: Reported on 04/21/2021) 90 tablet 2   TRUEplus Lancets 30G MISC by Does not apply route. Use as directed twice a daily Dx E11.65     Past Medical History:  Diagnosis Date   Cholecystitis, chronic    Cholelithiasis    COPD (chronic obstructive pulmonary disease) (HCC)     Diabetes mellitus without complication (HCC)    Diabetic retinopathy (HCC)    Dyspnea    Fatty liver 2008   Fracture of clavicle 1992   left  Fracture of ribs, multiple 1992   3 ribs   GERD (gastroesophageal reflux disease)    Hyperlipidemia    Hypertension    Paroxysmal SVT (supraventricular tachycardia) (HCC)    Pelvis fracture ([place]) 1992   Peripheral vascular disease ([place])    Pleurisy    Pleurisy    Seasonal allergies    Past Surgical History:  Procedure Laterality Date   AMPUTATION TOE Left 02/25/2017   Procedure: AMPUTATION TOE-LEFT 2ND MPJ;  Surgeon: Samara Deist, DPM;  Location: [facility];  Service: Podiatry;  Laterality: Left;   AMPUTATION TOE Left 10/20/2018   Procedure: TOE MPJ T2 LEFT;  Surgeon: Samara Deist, DPM;  Location: [facility];  Service: Podiatry;  Laterality: Left;   AMPUTATION TOE Left 01/12/2019   Procedure: AMPUTATION LEFT 5TH TOE AND JOINT;  Surgeon: Samara Deist, DPM;  Location: [facility];  Service: Podiatry;  Laterality: Left;   BACK SURGERY  2008   BONE BIOPSY Left 11/21/2020   Procedure: BONE BIOPSY;  Surgeon: Samara Deist, DPM;  Location: [facility];  Service: Podiatry;  Laterality: Left;   CHOLECYSTECTOMY     COLONOSCOPY WITH PROPOFOL N/A 08/15/2017   Procedure: COLONOSCOPY WITH PROPOFOL;  Surgeon: Lollie Sails, MD;  Location: [facility] ENDOSCOPY;  Service: Endoscopy;  Laterality: N/A;   ENDARTERECTOMY Right 05/12/2017   Procedure: ENDARTERECTOMY CAROTID;  Surgeon: [name], MD;  Location: [facility];  Service: Vascular;  Laterality: Right;   ENDARTERECTOMY FEMORAL Left 12/01/2018   Procedure: ENDARTERECTOMY FEMORAL;  Surgeon: [name], MD;  Location: [facility];  Service: Vascular;  Laterality: Left;   ERCP     FOOT SURGERY Right    x 3   IRRIGATION AND DEBRIDEMENT FOOT Left 11/21/2020   Procedure: IRRIGATION AND DEBRIDEMENT FOOT--with placement of wound vac;  Surgeon: Samara Deist, DPM;  Location: [facility];  Service: Podiatry;  Laterality:  Left;   LOWER EXTREMITY ANGIOGRAM Left 12/01/2018   Procedure: LOWER EXTREMITY ANGIOGRAM;  Surgeon: [name], MD;  Location: [facility];  Service: Vascular;  Laterality: Left;   LOWER EXTREMITY ANGIOGRAPHY Left 10/30/2018   Procedure: LOWER EXTREMITY ANGIOGRAPHY;  Surgeon: [name], MD;  Location: [place] [facility];  Service: Cardiovascular;  Laterality: Left;   LOWER EXTREMITY ANGIOGRAPHY Left 11/30/2018   Procedure: LOWER EXTREMITY ANGIOGRAPHY;  Surgeon: [name], MD;  Location: [place] [facility];  Service: Cardiovascular;  Laterality: Left;   LOWER EXTREMITY ANGIOGRAPHY Left 11/17/2020   Procedure: Lower Extremity Angiography;  Surgeon: [name], MD;  Location: [place] [facility];  Service: Cardiovascular;  Laterality: Left;   TEE WITHOUT CARDIOVERSION N/A 11/14/2020   Procedure: TRANSESOPHAGEAL ECHOCARDIOGRAM (TEE);  Surgeon: Corey Skains, MD;  Location: [facility];  Service: Cardiovascular;  Laterality: N/A;   Social History Social History   Tobacco Use   Smoking status: Former    Types: Cigarettes    Quit date: 02/24/1997    Years since quitting: 24.1   Smokeless tobacco: Never  Vaping Use   Vaping Use: Never used  Substance Use Topics   Alcohol use: No   Drug use: No   Family History Family History  Problem Relation Age of Onset   Diabetes Sister   Denies family hx of PAD, venous or renal disease.  Allergies  Allergen Reactions   Penicillins Rash and Other (See Comments)    Rash in and around the mouth Has patient had a PCN reaction causing immediate rash, facial/tongue/throat swelling, SOB or lightheadedness with hypotension: Yes  Has patient had a PCN reaction causing severe rash involving mucus membranes or skin necrosis: Yes Has patient had a PCN reaction that required hospitalization: No Has patient had a PCN reaction occurring within the last 10 years: Yes If all of the above answers are "NO", then may proceed with Cephalosporin use.    Bactrim  [Sulfamethoxazole-Trimethoprim] Other (See Comments)    Mouth sores, Sores develop in mouth   REVIEW OF SYSTEMS (Negative unless checked)  Constitutional: [] Weight loss  [] Fever  [] Chills Cardiac: [] Chest pain   [] Chest pressure   [] Palpitations   [] Shortness of breath when laying flat   [] Shortness of breath at rest   [] Shortness of breath with exertion. Vascular:  [] Pain in legs with walking   [] Pain in legs at rest   [] Pain in legs when laying flat   [] Claudication   [x] Pain in feet when walking  [x] Pain in feet at rest  [] Pain in feet when laying flat   [] History of DVT   [] Phlebitis   [] Swelling in legs   [] Varicose veins   [] Non-healing ulcers Pulmonary:   [] Uses home oxygen   [] Productive cough   [] Hemoptysis   [] Wheeze  [] COPD   [] Asthma Neurologic:  [] Dizziness  [] Blackouts   [] Seizures   [] History of stroke   [] History of TIA  [] Aphasia   [] Temporary blindness   [] Dysphagia   [] Weakness or numbness in arms   [] Weakness or numbness in legs Musculoskeletal:  [] Arthritis   [] Joint swelling   [] Joint pain   [] Low back pain Hematologic:  [] Easy bruising  [] Easy bleeding   [] Hypercoagulable state   [] Anemic  [] Hepatitis Gastrointestinal:  [] Blood in stool   [] Vomiting blood  [] Gastroesophageal reflux/heartburn   [] Difficulty swallowing. Genitourinary:  [] Chronic kidney disease   [] Difficult urination  [] Frequent urination  [] Burning with urination   [] Blood in urine Skin:  [] Rashes   [x] Ulcers   [x] Wounds Psychological:  [] History of anxiety   []  History of major depression.  Physical Examination  Vitals:   04/21/21 0430 04/21/21 0630 04/21/21 0931 04/21/21 1027  BP: 122/66 121/72 112/71 129/68  Pulse: 96 89 69 72  Resp: 18 18 18 16   Temp:   98.4 F (36.9 C)   TempSrc:   Oral   SpO2: 99% 99% 99% 95%  Weight:      Height:       Body mass index is 22.97 kg/m. Gen:  WD/WN, NAD Head: [place]/AT, No temporalis wasting. Prominent temp pulse not noted. Ear/Nose/Throat: Hearing grossly  intact, nares w/o erythema or drainage, oropharynx w/o Erythema/Exudate Eyes: Sclera non-icteric, conjunctiva clear Neck: Trachea midline.  No JVD.  Pulmonary:  Good air movement, respirations not labored, equal bilaterally.  Cardiac: RRR, normal S1, S2. Vascular:  Vessel Right Left  Radial Palpable Palpable  Ulnar Palpable Palpable  Brachial Palpable Palpable  Carotid Palpable, without bruit Palpable, without bruit  Aorta Not palpable N/A  Femoral Palpable Palpable  Popliteal Palpable Palpable  PT Non-Palpable Non-Palpable  DP Non-Palpable Non-Palpable   Left Lower Extremity: Thigh soft. Calf soft. Heel ulceration with foul smell. Hard to palpate pedal pulses.   Gastrointestinal: soft, non-tender/non-distended. No guarding/reflex.  Musculoskeletal: M/S 5/5 throughout.  Extremities without ischemic changes.  No deformity or atrophy. No edema. Neurologic: Sensation grossly intact in extremities.  Symmetrical.  Speech is fluent. Motor exam as listed above. Psychiatric: Judgment intact, Mood & affect appropriate for pt's clinical situation. Dermatologic: Heel ulceration Lymph : No Cervical, Axillary, or Inguinal lymphadenopathy.  CBC Lab Results  Component  Value Date   WBC 6.7 04/21/2021   HGB 8.4 (L) 04/21/2021   HCT 26.6 (L) 04/21/2021   MCV 81.8 04/21/2021   PLT 219 04/21/2021   BMET    Component Value Date/Time   NA 132 (L) 04/21/2021 0625   NA 136 04/17/2014 1026   K 4.1 04/21/2021 0625   K 5.1 04/17/2014 1026   CL 98 04/21/2021 0625   CL 103 04/17/2014 1026   CO2 25 04/21/2021 0625   CO2 28 04/17/2014 1026   GLUCOSE 169 (H) 04/21/2021 0625   GLUCOSE 124 (H) 04/17/2014 1026   BUN 15 04/21/2021 0625   BUN 28 (H) 04/17/2014 1026   CREATININE 0.90 04/21/2021 0625   CREATININE 1.30 04/17/2014 1026   CALCIUM 8.5 (L) 04/21/2021 0625   CALCIUM 8.6 04/17/2014 1026   GFRNONAA >60 04/21/2021 0625   GFRNONAA 59 (L) 04/17/2014 1026   GFRAA >60 06/20/2020 1001   GFRAA  >60 04/17/2014 1026   Estimated Creatinine Clearance: 81.8 mL/min (by C-G formula based on SCr of 0.9 mg/dL).  COAG Lab Results  Component Value Date   INR 1.2 11/12/2020   INR 1.0 12/01/2018   INR 0.97 05/05/2017   Radiology MR ANKLE LEFT WO CONTRAST  Result Date: 04/21/2021 CLINICAL DATA:  L calc osteo with wound, severe EXAM: MRI OF THE LEFT ANKLE WITHOUT CONTRAST TECHNIQUE: Multiplanar, multisequence MR imaging of the ankle was performed. No intravenous contrast was administered. COMPARISON:  Foot radiograph 04/20/2021 FINDINGS: TENDONS Peroneal: There is tenosynovitis of the peroneal longus and brevis tendons above and below the malleolus. There is an accessory peroneal tendon inserting on the lateral calcaneus consistent with a peroneus quartus. There is continuity distally through its attachment on the base of the fifth metatarsal. Posteromedial: Tenosynovitis of the posterior tibial tendon below the malleolus at the level of the talar head/neck. Flexor digitorum longus tendon intact. Flexor hallucis longus tendon intact. Anterior: Tibialis anterior tendon intact. Extensor hallucis longus tendon intact Extensor digitorum longus tendon intact. Achilles:  Distal Achilles tendinosis and full-thickness tearing. Plantar Fascia: Disruption of the proximal medial and lateral bundle plantar fascia attachment. LIGAMENTS Lateral: Anterior talofibular ligament intact. Calcaneofibular ligament intact. Posterior talofibular ligament intact. Anterior and posterior tibiofibular ligaments intact. Medial: Chronic deep deltoid sprain.  Spring ligament intact. CARTILAGE Ankle Joint: No joint effusion. Normal ankle mortise. No chondral defect. Subtalar Joints/Sinus Tarsi: Normal subtalar joints. No subtalar joint effusion. Normal sinus tarsi. Bones: There is bone destruction of the posterior calcaneus with extensive bony edema and confluent low T1 signal extending throughout the calcaneal body, with a mildly  preserved fat in the most anterior portion, subchondral in the posterior subtalar facet, and in the sustentaculum tali. There is patchy bone marrow edema within the navicular, cuboid, middle and lateral cuneiforms, likely stress reaction. Soft Tissue: Heel soft tissue ulcer noted. Generalized soft tissue swelling of the foot and ankle including the intrinsic musculature of the foot. The origin of the quadratus plantae and abductor hallucis tendons are disrupted (coronal T2 images 27, 25). IMPRESSION: Extensive osteomyelitis of the calcaneus, with bony destruction of the posterior and plantar aspect. Disruption of the distal Achilles tendon and proximal medial/lateral bundle plantar fascia attachments. Disrupted origins of the quadratus plantae and abductor hallucis tendons. Adjacent soft tissue ulcer with soft tissue inflammation but no well-defined abscess. Tenosynovitis of the peroneal longus and brevis tendons above and below the malleolus. Anatomic variant peroneus quartus is present. Tenosynovitis of the posterior tibial tendon below the malleolus at the level  of the talar head/neck. Patchy bone marrow edema within the navicular, cuboid, middle and lateral cuneiforms, likely stress reaction. Electronically Signed   By: Maurine Simmering   On: 04/21/2021 10:37   DG Foot Complete Left  Result Date: 04/20/2021 CLINICAL DATA:  Heel ulcer EXAM: LEFT FOOT - COMPLETE 3+ VIEW COMPARISON:  11/11/2020 FINDINGS: There is diffuse soft tissue swelling of the foot with lucency along the heel suggestive of heel ulcer. There is new bony destruction of the posterior calcaneus. Vascular calcifications. Prior second and third toe amputations and fifth ray amputation to the level of the mid metatarsal. Severe hallux valgus with bunion formation. IMPRESSION: Osteomyelitis of the posterior calcaneus with significant bony destruction. Adjacent soft tissue ulcer. Diffuse soft tissue swelling. Electronically Signed   By: Maurine Simmering    On: 04/20/2021 21:26    Assessment/Plan The patient is a 70 year old male with multiple medical issues including known PAD requiring endovascular intervention who presents with worsening left heel wound  1. Atherosclerotic Disease With Ulceration: Patient with non-healing and recently worsening wound to the left heel. Known PAD requiring endovascular / open intervention in the past. Hard to palpate pedal pulses. Recommend left lower extremity angiogram with possible intervention in an attempt to assess the patients anatomy and contributing degree of atherosclerotic disease. If appropriate, an attempt to revascularize the leg came be made at that time. Procedure, risks and benefits were explained to the patient. All questions were answered. Patient wishes to proceed. Will plan on intervention tomorrow AM.   2. Diabetes: Most likely uncontrolled. Encouraged good control as its slows the progression of atherosclerotic disease.   3. Hyperlipidemia:  On ASA, plavix and statin for medical management. Encouraged good control as its slows the progression of atherosclerotic disease.   Discussed with Dr. Mayme Genta, PA-C  04/21/2021 10:40 AM  This note was created with Dragon medical transcription system.  Any error is purely unintentional.

## 2021-04-22 ENCOUNTER — Ambulatory Visit: Payer: Self-pay

## 2021-04-22 ENCOUNTER — Encounter
Admission: EM | Disposition: A | Discharge status: Skilled Nursing Facility | Payer: Self-pay | Source: Home / Self Care | Attending: Internal Medicine

## 2021-04-22 ENCOUNTER — Other Ambulatory Visit (INDEPENDENT_AMBULATORY_CARE_PROVIDER_SITE_OTHER): Payer: Self-pay | Admitting: Vascular Surgery

## 2021-04-22 ENCOUNTER — Encounter: Payer: Self-pay | Admitting: Vascular Surgery

## 2021-04-22 DIAGNOSIS — I70262 Atherosclerosis of native arteries of extremities with gangrene, left leg: Secondary | ICD-10-CM

## 2021-04-22 DIAGNOSIS — L089 Local infection of the skin and subcutaneous tissue, unspecified: Secondary | ICD-10-CM | POA: Diagnosis not present

## 2021-04-22 DIAGNOSIS — E11628 Type 2 diabetes mellitus with other skin complications: Secondary | ICD-10-CM | POA: Diagnosis not present

## 2021-04-22 HISTORY — PX: LOWER EXTREMITY ANGIOGRAPHY: CATH118251

## 2021-04-22 LAB — BASIC METABOLIC PANEL
Anion gap: 8 (ref 5–15)
BUN: 17 mg/dL (ref 8–23)
CO2: 27 mmol/L (ref 22–32)
Calcium: 8.6 mg/dL — ABNORMAL LOW (ref 8.9–10.3)
Chloride: 98 mmol/L (ref 98–111)
Creatinine, Ser: 0.87 mg/dL (ref 0.61–1.24)
GFR, Estimated: >60 mL/min (ref >60)
Glucose, Bld: 253 mg/dL — ABNORMAL HIGH (ref 70–99)
Potassium: 4.4 mmol/L (ref 3.5–5.1)
Sodium: 133 mmol/L — ABNORMAL LOW (ref 135–145)

## 2021-04-22 LAB — GLUCOSE, CAPILLARY
Glucose-Capillary: 252 mg/dL — ABNORMAL HIGH (ref 70–99)
Glucose-Capillary: 160 mg/dL — ABNORMAL HIGH (ref 70–99)
Glucose-Capillary: 144 mg/dL — ABNORMAL HIGH (ref 70–99)
Glucose-Capillary: 158 mg/dL — ABNORMAL HIGH (ref 70–99)
Glucose-Capillary: 207 mg/dL — ABNORMAL HIGH (ref 70–99)
Glucose-Capillary: 246 mg/dL — ABNORMAL HIGH (ref 70–99)

## 2021-04-22 LAB — HEMOGLOBIN A1C
Hgb A1c MFr Bld: 8.3 % — ABNORMAL HIGH (ref 4.8–5.6)
Mean Plasma Glucose: 192 mg/dL

## 2021-04-22 SURGERY — LOWER EXTREMITY ANGIOGRAPHY
Anesthesia: Moderate Sedation | Laterality: Left

## 2021-04-22 MED ORDER — SODIUM CHLORIDE 0.9 % IV BOLUS
1000.0000 mL | Freq: Once | INTRAVENOUS | Status: AC
  Administered 2021-04-22: 1000 mL via INTRAVENOUS

## 2021-04-22 MED ORDER — HEPARIN SODIUM (PORCINE) 1000 UNIT/ML IJ SOLN
INTRAMUSCULAR | Status: AC
  Filled 2021-04-22: qty 1

## 2021-04-22 MED ORDER — HYDROMORPHONE HCL 1 MG/ML IJ SOLN: 1.0000 mg | Freq: Once | INTRAMUSCULAR | Status: DC | PRN

## 2021-04-22 MED ORDER — MIDAZOLAM HCL 2 MG/2ML IJ SOLN
INTRAMUSCULAR | Status: DC | PRN
  Administered 2021-04-22: 2 mg via INTRAVENOUS

## 2021-04-22 MED ORDER — MIDAZOLAM HCL 2 MG/ML PO SYRP: 8.0000 mg | ORAL_SOLUTION | Freq: Once | ORAL | Status: DC | PRN

## 2021-04-22 MED ORDER — KETOROLAC TROMETHAMINE 15 MG/ML IJ SOLN
15.0000 mg | Freq: Four times a day (QID) | INTRAMUSCULAR | Status: AC
Start: 2021-04-22 — End: 2021-04-27
  Administered 2021-04-22 – 2021-04-27 (×19): 15 mg via INTRAVENOUS
  Filled 2021-04-22 (×19): qty 1

## 2021-04-22 MED ORDER — MIDAZOLAM HCL 2 MG/2ML IJ SOLN
INTRAMUSCULAR | Status: AC
  Filled 2021-04-22: qty 2

## 2021-04-22 MED ORDER — VANCOMYCIN HCL IN DEXTROSE 1-5 GM/200ML-% IV SOLN
1000.0000 mg | Freq: Two times a day (BID) | INTRAVENOUS | Status: DC
  Administered 2021-04-22 – 2021-04-23 (×2): 1000 mg via INTRAVENOUS
  Filled 2021-04-22 (×4): qty 200

## 2021-04-22 MED ORDER — SODIUM CHLORIDE 0.9 % IV SOLN: INTRAVENOUS | Status: DC

## 2021-04-22 MED ORDER — METHYLPREDNISOLONE SODIUM SUCC 125 MG IJ SOLR: 125.0000 mg | Freq: Once | INTRAMUSCULAR | Status: DC | PRN

## 2021-04-22 MED ORDER — FAMOTIDINE 20 MG PO TABS: 40.0000 mg | ORAL_TABLET | Freq: Once | ORAL | Status: DC | PRN

## 2021-04-22 MED ORDER — FENTANYL CITRATE (PF) 100 MCG/2ML IJ SOLN
INTRAMUSCULAR | Status: AC
  Filled 2021-04-22: qty 2

## 2021-04-22 MED ORDER — IODIXANOL 320 MG/ML IV SOLN
INTRAVENOUS | Status: DC | PRN
  Administered 2021-04-22: 35 mL

## 2021-04-22 MED ORDER — DIPHENHYDRAMINE HCL 50 MG/ML IJ SOLN: 50.0000 mg | Freq: Once | INTRAMUSCULAR | Status: DC | PRN

## 2021-04-22 MED ORDER — ONDANSETRON HCL 4 MG/2ML IJ SOLN: 4.0000 mg | Freq: Four times a day (QID) | INTRAMUSCULAR | Status: DC | PRN

## 2021-04-22 MED ORDER — CLINDAMYCIN PHOSPHATE 300 MG/50ML IV SOLN
INTRAVENOUS | Status: AC
  Administered 2021-04-22: 300 mg via INTRAVENOUS
  Filled 2021-04-22: qty 50

## 2021-04-22 MED ORDER — FENTANYL CITRATE (PF) 100 MCG/2ML IJ SOLN
INTRAMUSCULAR | Status: DC | PRN
  Administered 2021-04-22: 50 ug via INTRAVENOUS

## 2021-04-22 MED ORDER — CLINDAMYCIN PHOSPHATE 300 MG/50ML IV SOLN: 300.0000 mg | Freq: Once | INTRAVENOUS | Status: AC

## 2021-04-22 SURGICAL SUPPLY — 9 items
CATH ANGIO 5F PIGTAIL 65CM (CATHETERS) ×1 IMPLANT
COVER PROBE U/S 5X48 (MISCELLANEOUS) ×1 IMPLANT
DEVICE STARCLOSE SE CLOSURE (Vascular Products) ×1 IMPLANT
GLIDEWIRE ADV .035X260CM (WIRE) ×1 IMPLANT
PACK ANGIOGRAPHY (CUSTOM PROCEDURE TRAY) ×2 IMPLANT
SHEATH BRITE TIP 5FRX11 (SHEATH) ×1 IMPLANT
SYR MEDRAD MARK 7 150ML (SYRINGE) ×1 IMPLANT
TUBING CONTRAST HIGH PRESS 72 (TUBING) ×1 IMPLANT
WIRE GUIDERIGHT .035X150 (WIRE) ×2 IMPLANT

## 2021-04-22 NOTE — Consult Note (Signed)
Pharmacy Antibiotic Note  [name] is a 70 y.o. male admitted on 04/20/2021 with cellulitis. Patient with nonhealing wound to left heel. He has a history of osteomyelitis in the same foot. He saw podiatry outpatient where he was sent to the hospital as he had a large necrotic heel ulceration that was probed directly down the the bone. Imaging consistent with osteomyelitis of posterior calcaneus with associated bony destruction. Pharmacy has been consulted for Vancomycin dosing.  Plan: Change vancomycin to 1000 mg IV q12h given improvement in creatinine (based on previous hospitalization and vanc levels drawn, this appears to be the recommended dose as well) Goal AUC 400-550 Expected AUC: 512 SCr used: 0.87   Height: '5\' 10"'$  (177.8 cm) Weight: 72.6 kg (160 lb 0.9 oz) IBW/kg (Calculated) : 73  Temp (24hrs), Avg:98.2 F (36.8 C), Min:97.7 F (36.5 C), Max:98.5 F (36.9 C)  Recent Labs  Lab 04/20/21 1409 04/20/21 2130 04/21/21 0625 04/22/21 0612  WBC 7.5  --  6.7  --   CREATININE 1.04  --  0.90 0.87  LATICACIDVEN 2.2* 1.6  --   --      Estimated Creatinine Clearance: 84.6 mL/min (by C-G formula based on SCr of 0.87 mg/dL).    Allergies  Allergen Reactions   Penicillins Rash and Other (See Comments)    Rash in and around the mouth Has patient had a PCN reaction causing immediate rash, facial/tongue/throat swelling, SOB or lightheadedness with hypotension: Yes Has patient had a PCN reaction causing severe rash involving mucus membranes or skin necrosis: Yes Has patient had a PCN reaction that required hospitalization: No Has patient had a PCN reaction occurring within the last 10 years: Yes If all of the above answers are "NO", then may proceed with Cephalosporin use.    Bactrim [Sulfamethoxazole-Trimethoprim] Other (See Comments)    Mouth sores, Sores develop in mouth    Antimicrobials this admission: Vancomycin 7/25 >> Ceftriaxone 7/26 >> Metronidazole 7/26 >>  Dose  adjustments this admission: 7/27 Vanc 1500 mg q24h >> 1000 mg q12h  Microbiology results: N/A   Thank you for allowing pharmacy to be a part of this patient's care.  Tawnya Crook, PharmD, BCPS Clinical Pharmacist 04/22/2021 12:54 PM

## 2021-04-22 NOTE — Op Note (Signed)
[name]  Percutaneous Study/Intervention Procedural Note   Date of Surgery: 04/22/2021  Surgeon(s):Murlean Seelye    Assistants:none  Pre-operative Diagnosis: PAD with gangrene left foot  Post-operative diagnosis:  Same  Procedure(s) Performed:             1.  Ultrasound guidance for vascular access right femoral artery             2.  Catheter placement into left common femoral artery from right femoral             3.  Aortogram and selective left lower extremity angiogram             4.  StarClose closure device right femoral artery  EBL: 5 cc  Contrast: 35 cc  Fluoro Time: 1 minute  Moderate Conscious Sedation Time: approximately 14 minutes using 2 mg of Versed and 50 mcg of Fentanyl              Indications:  Patient is a 70 y.o.male with worsening ulceration infection now with gangrenous changes to the left foot.  The patient has a known history of peripheral arterial disease and multiple previous interventions.  The patient is brought in for angiography for further evaluation and potential treatment.  Due to the limb threatening nature of the situation, angiogram was performed for attempted limb salvage. The patient is aware that if the procedure fails, amputation would be expected.  The patient also understands that even with successful revascularization, amputation may still be required due to the severity of the situation.  Risks and benefits are discussed and informed consent is obtained.   Procedure:  The patient was identified and appropriate procedural time out was performed.  The patient was then placed supine on the table and prepped and draped in the usual sterile fashion. Moderate conscious sedation was administered during a face to face encounter with the patient throughout the procedure with my supervision of the RN administering medicines and monitoring the patient's vital signs, pulse oximetry, telemetry and mental status throughout from the  start of the procedure until the patient was taken to the recovery room. Ultrasound was used to evaluate the right common femoral artery.  It had flow but was fairly heavily diseased.  A digital ultrasound image was acquired.  A Seldinger needle was used to access the right common femoral artery under direct ultrasound guidance and a permanent image was performed.  A 0.035 J wire was advanced without resistance and a 5Fr sheath was placed.  Pigtail catheter was placed into the aorta and an AP aortogram was performed. This demonstrated some degree of left renal artery stenosis appeared present although it did not appear high-grade.  The right renal artery appeared patent.  The aorta and iliac arteries were widely patent. I then crossed the aortic bifurcation and advanced to the left femoral head. Selective left lower extremity angiogram was then performed. This demonstrated patent left common femoral artery and profunda femoris artery.  The previous stents in the left SFA and popliteal arteries were widely patent without significant recurrent stenosis.  The anterior tibial artery and peroneal arteries were then widely patent and relatively large with no focal stenosis present all the way down to the foot and ankle.  There was no role for intervention.  The patient had adequate blood flow to the left leg.  I elected to terminate the procedure. The sheath was removed and StarClose closure device was deployed in the right femoral artery with excellent  hemostatic result. The patient was taken to the recovery room in stable condition having tolerated the procedure well.  Findings:               Aortogram: Some degree of left renal artery stenosis appeared present although it did not appear high-grade.  The right renal artery appeared patent.  The aorta and iliac arteries were widely patent.             Left lower Extremity:  This demonstrated patent left common femoral artery and profunda femoris artery.  The previous  stents in the left SFA and popliteal arteries were widely patent without significant recurrent stenosis.  The anterior tibial artery and peroneal arteries were then widely patent and relatively large with no focal stenosis present all the way down to the foot and ankle   Disposition: Patient was taken to the recovery room in stable condition having tolerated the procedure well.  Complications: None  [name] 04/22/2021 11:13 AM   This note was created with Dragon Medical transcription system. Any errors in dictation are purely unintentional.

## 2021-04-22 NOTE — Interval H&P Note (Signed)
History and Physical Interval Note:  04/22/2021 10:17 AM  [name]  has presented today for surgery, with the diagnosis of Atherosclerotic Disease With Ulceration.  The various methods of treatment have been discussed with the patient and family. After consideration of risks, benefits and other options for treatment, the patient has consented to  Procedure(s): Lower Extremity Angiography (Left) as a surgical intervention.  The patient's history has been reviewed, patient examined, no change in status, stable for surgery.  I have reviewed the patient's chart and labs.  Questions were answered to the patient's satisfaction.     Leotis Pain

## 2021-04-22 NOTE — Progress Notes (Signed)
PROGRESS NOTE    [name]  [id number] DOB: 1951/09/24 DOA: 04/20/2021 PCP: [name], MD   Brief Narrative:  70 y.o. male with medical history significant for DM, HTN, PAD s/p prior amputations digits left foot who presents to the emergency room a for evaluation of a nonhealing painful wound of the left heel which he said he noticed just a week prior.  Has a history of osteomyelitis of the left foot in the past.  He denies fever and chills and denies shortness of breath or chest pain  Patient was admitted to the hospitalist service with podiatry and vascular surgery consulted.  Vascular surgery to take patient for angiography and revascularization attempt on 7/27.  Patient is hemodynamically stable.  Imaging survey significant for osteomyelitis of posterior calcaneus with associated bony destruction and diffuse soft tissue swelling consistent with cellulitis.  Remains on antibiotics.  Underwent angiography with vascular surgery on 7/27.  Patent vasculature.  Per podiatry evaluation left lower extremity is likely not salvageable.  Vascular surgery plans for left BKA on 7/28   Assessment & Plan:   Principal Problem:   Diabetic foot infection ([place]) Active Problems:   PAD (peripheral artery disease) ([place])   Essential hypertension   Uncontrolled type 2 diabetes mellitus with hyperglycemia ([facility])   Acute osteomyelitis of left foot ([facility])   Anemia   Elevated lactic acid level   Hyponatremia  Acute osteomyelitis left foot Diabetic ulcer left foot Peripheral arterial disease Patient complains of wound to left heel worsening over the past week History of osteomyelitis on affected extremity with history of palpitations Vascular surgery podiatry consulted from ED Status post angiography 7/27, patent vasculature Left foot likely not salvageable Plan for left BKA 7/28 Plan: Broad-spectrum antibiotics with vancomycin, Rocephin, Flagyl for now Can likely discontinue antibiotics  after amputation Continue statin and aspirin Hold Plavix Resume diet today N.p.o. after midnight Vascular following for left BKA 7/28  Essential hypertension PTA metoprolol  Type 2 diabetes mellitus uncontrolled with hyperglycemia Last hemoglobin A1c in May was 7.1 Plan: Continue Lantus 5 units daily Moderate sliding scale Nightly coverage Carb modified diet Uptitrate insulin regimen as necessary  Anemia of chronic disease Unclear underlying disease Suspect PAD versus uncontrolled diabetes Hemoglobin low but stable No indication for transfusion  Chronic hyponatremia Likely euvolemic hyponatremia Continue to monitor   DVT prophylaxis: SQ Lovenox Code Status: Full Family Communication: None today Disposition Plan: Status is: Inpatient  Remains inpatient appropriate because:Inpatient level of care appropriate due to severity of illness  Dispo: The patient is from: Home              Anticipated d/c is to: Home              Patient currently is not medically stable to d/c.   Difficult to place patient No  Patient with likely nonsalvageable left foot.  Plan for left BKA 7/28.  Postop PT and OT.  Likely need placement.     Level of care: Med-Surg  Consultants:  Vascular surgery Podiatry  Procedures:  Angiography 7/27  Antimicrobials:  Vancomycin Rocephin Metronidazole   Subjective: Patient seen and examined.  No visible distress.  Complaining of some pain in the left foot.  Objective: Vitals:   04/22/21 1130 04/22/21 1145 04/22/21 1200 04/22/21 1218  BP: 94/65 1'05/64 91/62 99/68 '$  Pulse: 82 81 81 77  Resp: '16 16 16 17  '$ Temp:    97.7 F (36.5 C)  TempSrc:      SpO2:  100% 93% 96% 96%  Weight:      Height:        Intake/Output Summary (Last 24 hours) at 04/22/2021 1453 Last data filed at 04/22/2021 0500 Gross per 24 hour  Intake 200 ml  Output 850 ml  Net -650 ml   Filed Weights   04/20/21 1407  Weight: 72.6 kg    Examination:  General  exam: No acute distress Respiratory system: Clear to auscultation. Respiratory effort normal. Cardiovascular system: S1-S2, regular rate and rhythm, no murmurs, no pedal edema  gastrointestinal system: Abdomen is nondistended, soft and nontender. No organomegaly or masses felt. Normal bowel sounds heard. Central nervous system: Alert and oriented. No focal neurological deficits. Extremities: Decreased power left lower extremity.  Left foot multiple toe amputations.  And 5cm left heel ulcer.  Weak bilateral pedal pulses Skin: No rashes, lesions or ulcers Psychiatry: Judgement and insight appear normal. Mood & affect appropriate.     Data Reviewed: I have personally reviewed following labs and imaging studies  CBC: Recent Labs  Lab 04/20/21 1409 04/21/21 0625  WBC 7.5 6.7  NEUTROABS 5.8  --   HGB 9.6* 8.4*  HCT 29.6* 26.6*  MCV 80.0 81.8  PLT 232 [id number]   Basic Metabolic Panel: Recent Labs  Lab 04/20/21 1409 04/21/21 0625 04/22/21 0612  NA 130* 132* 133*  K 4.9 4.1 4.4  CL 96* 98 98  CO2 '25 25 27  '$ GLUCOSE 343* 169* 253*  BUN '21 15 17  '$ CREATININE 1.04 0.90 0.87  CALCIUM 8.6* 8.5* 8.6*   GFR: Estimated Creatinine Clearance: 84.6 mL/min (by C-G formula based on SCr of 0.87 mg/dL). Liver Function Tests: Recent Labs  Lab 04/20/21 1409  AST 17  ALT 14  ALKPHOS 76  BILITOT 0.6  PROT 7.1  ALBUMIN 2.7*   No results for input(s): LIPASE, AMYLASE in the last 168 hours. No results for input(s): AMMONIA in the last 168 hours. Coagulation Profile: No results for input(s): INR, PROTIME in the last 168 hours. Cardiac Enzymes: No results for input(s): CKTOTAL, CKMB, CKMBINDEX, TROPONINI in the last 168 hours. BNP (last 3 results) No results for input(s): PROBNP in the last 8760 hours. HbA1C: Recent Labs    04/20/21 1409  HGBA1C 8.3*   CBG: Recent Labs  Lab 04/21/21 2032 04/22/21 0754 04/22/21 1016 04/22/21 1114 04/22/21 1216  GLUCAP 177* 252* 160* 144* 158*    Lipid Profile: No results for input(s): CHOL, HDL, LDLCALC, TRIG, CHOLHDL, LDLDIRECT in the last 72 hours. Thyroid Function Tests: No results for input(s): TSH, T4TOTAL, FREET4, T3FREE, THYROIDAB in the last 72 hours. Anemia Panel: No results for input(s): VITAMINB12, FOLATE, FERRITIN, TIBC, IRON, RETICCTPCT in the last 72 hours. Sepsis Labs: Recent Labs  Lab 04/20/21 1409 04/20/21 2130  LATICACIDVEN 2.2* 1.6    Recent Results (from the past 240 hour(s))  Resp Panel by RT-PCR (Flu A&B, Covid) Nasopharyngeal Swab     Status: None   Collection Time: 04/20/21 10:38 PM   Specimen: Nasopharyngeal Swab; Nasopharyngeal(NP) swabs in vial transport medium  Result Value Ref Range Status   SARS Coronavirus 2 by RT PCR NEGATIVE NEGATIVE Final    Comment: (NOTE) SARS-CoV-2 target nucleic acids are NOT DETECTED.  The SARS-CoV-2 RNA is generally detectable in upper respiratory specimens during the acute phase of infection. The lowest concentration of SARS-CoV-2 viral copies this assay can detect is 138 copies/mL. A negative result does not preclude SARS-Cov-2 infection and should not be used as the sole basis for treatment  or other patient management decisions. A negative result may occur with  improper specimen collection/handling, submission of specimen other than nasopharyngeal swab, presence of viral mutation(s) within the areas targeted by this assay, and inadequate number of viral copies(<138 copies/mL). A negative result must be combined with clinical observations, patient history, and epidemiological information. The expected result is Negative.  Fact Sheet for Patients:  EntrepreneurPulse.com.au  Fact Sheet for Healthcare Providers:  IncredibleEmployment.be  This test is no t yet approved or cleared by the [place] FDA and  has been authorized for detection and/or diagnosis of SARS-CoV-2 by FDA under an Emergency Use Authorization  (EUA). This EUA will remain  in effect (meaning this test can be used) for the duration of the COVID-19 declaration under Section 564(b)(1) of the Act, 21 U.S.C.section 360bbb-3(b)(1), unless the authorization is terminated  or revoked sooner.       Influenza A by PCR NEGATIVE NEGATIVE Final   Influenza B by PCR NEGATIVE NEGATIVE Final    Comment: (NOTE) The Xpert Xpress SARS-CoV-2/FLU/RSV plus assay is intended as an aid in the diagnosis of influenza from Nasopharyngeal swab specimens and should not be used as a sole basis for treatment. Nasal washings and aspirates are unacceptable for Xpert Xpress SARS-CoV-2/FLU/RSV testing.  Fact Sheet for Patients: EntrepreneurPulse.com.au  Fact Sheet for Healthcare Providers: IncredibleEmployment.be  This test is not yet approved or cleared by the [place] FDA and has been authorized for detection and/or diagnosis of SARS-CoV-2 by FDA under an Emergency Use Authorization (EUA). This EUA will remain in effect (meaning this test can be used) for the duration of the COVID-19 declaration under Section 564(b)(1) of the Act, 21 U.S.C. section 360bbb-3(b)(1), unless the authorization is terminated or revoked.  Performed at [facility], [address] [place], [place] [address]          Radiology Studies: MR ANKLE LEFT WO CONTRAST  Result Date: 04/21/2021 CLINICAL DATA:  L calc osteo with wound, severe EXAM: MRI OF THE LEFT ANKLE WITHOUT CONTRAST TECHNIQUE: Multiplanar, multisequence MR imaging of the ankle was performed. No intravenous contrast was administered. COMPARISON:  Foot radiograph 04/20/2021 FINDINGS: TENDONS Peroneal: There is tenosynovitis of the peroneal longus and brevis tendons above and below the malleolus. There is an accessory peroneal tendon inserting on the lateral calcaneus consistent with a peroneus quartus. There is continuity distally through its attachment on the  base of the fifth metatarsal. Posteromedial: Tenosynovitis of the posterior tibial tendon below the malleolus at the level of the talar head/neck. Flexor digitorum longus tendon intact. Flexor hallucis longus tendon intact. Anterior: Tibialis anterior tendon intact. Extensor hallucis longus tendon intact Extensor digitorum longus tendon intact. Achilles:  Distal Achilles tendinosis and full-thickness tearing. Plantar Fascia: Disruption of the proximal medial and lateral bundle plantar fascia attachment. LIGAMENTS Lateral: Anterior talofibular ligament intact. Calcaneofibular ligament intact. Posterior talofibular ligament intact. Anterior and posterior tibiofibular ligaments intact. Medial: Chronic deep deltoid sprain.  Spring ligament intact. CARTILAGE Ankle Joint: No joint effusion. Normal ankle mortise. No chondral defect. Subtalar Joints/Sinus Tarsi: Normal subtalar joints. No subtalar joint effusion. Normal sinus tarsi. Bones: There is bone destruction of the posterior calcaneus with extensive bony edema and confluent low T1 signal extending throughout the calcaneal body, with a mildly preserved fat in the most anterior portion, subchondral in the posterior subtalar facet, and in the sustentaculum tali. There is patchy bone marrow edema within the navicular, cuboid, middle and lateral cuneiforms, likely stress reaction. Soft Tissue: Heel soft tissue ulcer noted.  Generalized soft tissue swelling of the foot and ankle including the intrinsic musculature of the foot. The origin of the quadratus plantae and abductor hallucis tendons are disrupted (coronal T2 images 27, 25). IMPRESSION: Extensive osteomyelitis of the calcaneus, with bony destruction of the posterior and plantar aspect. Disruption of the distal Achilles tendon and proximal medial/lateral bundle plantar fascia attachments. Disrupted origins of the quadratus plantae and abductor hallucis tendons. Adjacent soft tissue ulcer with soft tissue inflammation  but no well-defined abscess. Tenosynovitis of the peroneal longus and brevis tendons above and below the malleolus. Anatomic variant peroneus quartus is present. Tenosynovitis of the posterior tibial tendon below the malleolus at the level of the talar head/neck. Patchy bone marrow edema within the navicular, cuboid, middle and lateral cuneiforms, likely stress reaction. Electronically Signed   By: Maurine Simmering   On: 04/21/2021 10:37   PERIPHERAL VASCULAR CATHETERIZATION  Result Date: 04/22/2021 See surgical note for result.  DG Foot Complete Left  Result Date: 04/20/2021 CLINICAL DATA:  Heel ulcer EXAM: LEFT FOOT - COMPLETE 3+ VIEW COMPARISON:  11/11/2020 FINDINGS: There is diffuse soft tissue swelling of the foot with lucency along the heel suggestive of heel ulcer. There is new bony destruction of the posterior calcaneus. Vascular calcifications. Prior second and third toe amputations and fifth ray amputation to the level of the mid metatarsal. Severe hallux valgus with bunion formation. IMPRESSION: Osteomyelitis of the posterior calcaneus with significant bony destruction. Adjacent soft tissue ulcer. Diffuse soft tissue swelling. Electronically Signed   By: Maurine Simmering   On: 04/20/2021 21:26        Scheduled Meds:  aspirin EC  81 mg Oral Daily   enoxaparin (LOVENOX) injection  40 mg Subcutaneous Q24H   fentaNYL       gabapentin  300 mg Oral TID   heparin sodium (porcine)       insulin aspart  0-15 Units Subcutaneous TID WC   insulin aspart  0-5 Units Subcutaneous QHS   insulin glargine-yfgn  5 Units Subcutaneous Daily   ketorolac  15 mg Intravenous Q6H   metoprolol succinate  50 mg Oral Daily   midazolam       mometasone-formoterol  2 puff Inhalation BID   traMADol  50 mg Oral QHS   Continuous Infusions:  sodium chloride 75 mL/hr at 04/22/21 0947   [START ON 04/23/2021] sodium chloride     cefTRIAXone (ROCEPHIN)  IV 2 g (04/22/21 0947)   metronidazole 500 mg (04/22/21 0259)    vancomycin       LOS: 2 days    Time spent: 25 minutes    Sidney Ace, MD [facility] Pager [phone]  If 7PM-7AM, please contact night-coverage 04/22/2021, 2:53 PM

## 2021-04-23 ENCOUNTER — Other Ambulatory Visit: Payer: Self-pay

## 2021-04-23 ENCOUNTER — Inpatient Hospital Stay: Payer: Medicare HMO | Admitting: Anesthesiology

## 2021-04-23 ENCOUNTER — Telehealth: Payer: Self-pay | Admitting: Pharmacist

## 2021-04-23 ENCOUNTER — Encounter
Admission: EM | Disposition: A | Discharge status: Skilled Nursing Facility | Payer: Self-pay | Source: Home / Self Care | Attending: Internal Medicine

## 2021-04-23 DIAGNOSIS — L089 Local infection of the skin and subcutaneous tissue, unspecified: Secondary | ICD-10-CM | POA: Diagnosis not present

## 2021-04-23 DIAGNOSIS — E11628 Type 2 diabetes mellitus with other skin complications: Secondary | ICD-10-CM | POA: Diagnosis not present

## 2021-04-23 DIAGNOSIS — Z0000 Encounter for general adult medical examination without abnormal findings: Secondary | ICD-10-CM

## 2021-04-23 DIAGNOSIS — M86672 Other chronic osteomyelitis, left ankle and foot: Secondary | ICD-10-CM

## 2021-04-23 HISTORY — PX: AMPUTATION: SHX166

## 2021-04-23 LAB — GLUCOSE, CAPILLARY
Glucose-Capillary: 195 mg/dL — ABNORMAL HIGH (ref 70–99)
Glucose-Capillary: 178 mg/dL — ABNORMAL HIGH (ref 70–99)
Glucose-Capillary: 109 mg/dL — ABNORMAL HIGH (ref 70–99)
Glucose-Capillary: 128 mg/dL — ABNORMAL HIGH (ref 70–99)
Glucose-Capillary: 242 mg/dL — ABNORMAL HIGH (ref 70–99)

## 2021-04-23 LAB — CBC
HCT: 27.8 % — ABNORMAL LOW (ref 39.0–52.0)
Hemoglobin: 8.9 g/dL — ABNORMAL LOW (ref 13.0–17.0)
MCH: 25.3 pg — ABNORMAL LOW (ref 26.0–34.0)
MCHC: 32 g/dL (ref 30.0–36.0)
MCV: 79 fL — ABNORMAL LOW (ref 80.0–100.0)
Platelets: 209 10*3/uL (ref 150–400)
RBC: 3.52 MIL/uL — ABNORMAL LOW (ref 4.22–5.81)
RDW: 15.1 % (ref 11.5–15.5)
WBC: 3.8 10*3/uL — ABNORMAL LOW (ref 4.0–10.5)
nRBC: 0 % (ref 0.0–0.2)

## 2021-04-23 LAB — BASIC METABOLIC PANEL
Anion gap: 8 (ref 5–15)
BUN: 19 mg/dL (ref 8–23)
CO2: 24 mmol/L (ref 22–32)
Calcium: 8.2 mg/dL — ABNORMAL LOW (ref 8.9–10.3)
Chloride: 103 mmol/L (ref 98–111)
Creatinine, Ser: 0.98 mg/dL (ref 0.61–1.24)
GFR, Estimated: >60 mL/min (ref >60)
Glucose, Bld: 207 mg/dL — ABNORMAL HIGH (ref 70–99)
Potassium: 4 mmol/L (ref 3.5–5.1)
Sodium: 135 mmol/L (ref 135–145)

## 2021-04-23 LAB — MAGNESIUM: Magnesium: 1.9 mg/dL (ref 1.7–2.4)

## 2021-04-23 LAB — PREPARE RBC (CROSSMATCH)

## 2021-04-23 LAB — PROTIME-INR
INR: 1.1 (ref 0.8–1.2)
Prothrombin Time: 14.7 seconds (ref 11.4–15.2)

## 2021-04-23 LAB — APTT: aPTT: 44 seconds — ABNORMAL HIGH (ref 24–36)

## 2021-04-23 SURGERY — AMPUTATION BELOW KNEE
Anesthesia: General | Site: Knee | Laterality: Left

## 2021-04-23 MED ORDER — MEPERIDINE HCL 25 MG/ML IJ SOLN: 6.2500 mg | INTRAMUSCULAR | Status: DC | PRN

## 2021-04-23 MED ORDER — CHLORHEXIDINE GLUCONATE CLOTH 2 % EX PADS: 6.0000 | MEDICATED_PAD | Freq: Once | CUTANEOUS | Status: DC

## 2021-04-23 MED ORDER — LIDOCAINE HCL (CARDIAC) PF 100 MG/5ML IV SOSY
PREFILLED_SYRINGE | INTRAVENOUS | Status: DC | PRN
  Administered 2021-04-23: 100 mg via INTRAVENOUS

## 2021-04-23 MED ORDER — INSULIN GLARGINE-YFGN 100 UNIT/ML ~~LOC~~ SOLN
8.0000 [IU] | Freq: Every day | SUBCUTANEOUS | Status: DC
  Administered 2021-04-24: 8 [IU] via SUBCUTANEOUS
  Filled 2021-04-23: qty 0.08

## 2021-04-23 MED ORDER — FENTANYL CITRATE (PF) 100 MCG/2ML IJ SOLN
INTRAMUSCULAR | Status: AC
  Administered 2021-04-23: 50 ug via INTRAVENOUS
  Filled 2021-04-23: qty 2

## 2021-04-23 MED ORDER — DEXAMETHASONE SODIUM PHOSPHATE 10 MG/ML IJ SOLN
INTRAMUSCULAR | Status: DC | PRN
  Administered 2021-04-23: 5 mg via INTRAVENOUS

## 2021-04-23 MED ORDER — SODIUM CHLORIDE 0.9% IV SOLUTION: Freq: Once | INTRAVENOUS | Status: DC

## 2021-04-23 MED ORDER — OXYCODONE HCL 5 MG PO TABS
ORAL_TABLET | ORAL | Status: AC
  Filled 2021-04-23: qty 1

## 2021-04-23 MED ORDER — ACETAMINOPHEN 10 MG/ML IV SOLN
INTRAVENOUS | Status: AC
  Filled 2021-04-23: qty 100

## 2021-04-23 MED ORDER — DEXAMETHASONE SODIUM PHOSPHATE 10 MG/ML IJ SOLN
INTRAMUSCULAR | Status: AC
  Filled 2021-04-23: qty 1

## 2021-04-23 MED ORDER — FENTANYL CITRATE (PF) 100 MCG/2ML IJ SOLN
INTRAMUSCULAR | Status: DC | PRN
  Administered 2021-04-23: 25 ug via INTRAVENOUS
  Administered 2021-04-23: 50 ug via INTRAVENOUS
  Administered 2021-04-23: 25 ug via INTRAVENOUS

## 2021-04-23 MED ORDER — PROPOFOL 10 MG/ML IV BOLUS
INTRAVENOUS | Status: AC
  Filled 2021-04-23: qty 20

## 2021-04-23 MED ORDER — FENTANYL CITRATE (PF) 100 MCG/2ML IJ SOLN
25.0000 ug | INTRAMUSCULAR | Status: DC | PRN
  Administered 2021-04-23 (×2): 50 ug via INTRAVENOUS

## 2021-04-23 MED ORDER — SIMETHICONE 80 MG PO CHEW
80.0000 mg | CHEWABLE_TABLET | Freq: Four times a day (QID) | ORAL | Status: DC
  Administered 2021-04-23 – 2021-04-28 (×18): 80 mg via ORAL
  Filled 2021-04-23 (×23): qty 1

## 2021-04-23 MED ORDER — ALBUMIN HUMAN 5 % IV SOLN: 12.5000 g | Freq: Once | INTRAVENOUS | Status: AC

## 2021-04-23 MED ORDER — SODIUM CHLORIDE 0.9 % IV SOLN
INTRAVENOUS | Status: DC | PRN
  Administered 2021-04-23: 50 ug/min via INTRAVENOUS

## 2021-04-23 MED ORDER — ALBUMIN HUMAN 5 % IV SOLN: INTRAVENOUS | Status: DC | PRN

## 2021-04-23 MED ORDER — HYDROMORPHONE HCL 1 MG/ML IJ SOLN
INTRAMUSCULAR | Status: AC
  Administered 2021-04-23: 0.25 mg via INTRAVENOUS
  Filled 2021-04-23: qty 1

## 2021-04-23 MED ORDER — CLINDAMYCIN PHOSPHATE 900 MG/50ML IV SOLN
INTRAVENOUS | Status: AC
  Filled 2021-04-23: qty 50

## 2021-04-23 MED ORDER — FENTANYL CITRATE (PF) 100 MCG/2ML IJ SOLN
INTRAMUSCULAR | Status: AC
  Filled 2021-04-23: qty 2

## 2021-04-23 MED ORDER — SODIUM CHLORIDE FLUSH 0.9 % IV SOLN
INTRAVENOUS | Status: AC
  Filled 2021-04-23: qty 10

## 2021-04-23 MED ORDER — OXYCODONE HCL 5 MG PO TABS
5.0000 mg | ORAL_TABLET | Freq: Once | ORAL | Status: AC | PRN
  Administered 2021-04-23: 5 mg via ORAL

## 2021-04-23 MED ORDER — PHENYLEPHRINE HCL (PRESSORS) 10 MG/ML IV SOLN
INTRAVENOUS | Status: DC | PRN
  Administered 2021-04-23: 100 ug via INTRAVENOUS

## 2021-04-23 MED ORDER — ACETAMINOPHEN 10 MG/ML IV SOLN
INTRAVENOUS | Status: DC | PRN
Start: 2021-04-23 — End: 2021-04-23
  Administered 2021-04-23: 1000 mg via INTRAVENOUS

## 2021-04-23 MED ORDER — ACETAMINOPHEN 10 MG/ML IV SOLN: 1000.0000 mg | Freq: Once | INTRAVENOUS | Status: DC | PRN

## 2021-04-23 MED ORDER — SODIUM CHLORIDE 0.9 % IV SOLN: 10.0000 mL/h | Freq: Once | INTRAVENOUS | Status: DC

## 2021-04-23 MED ORDER — HYDROMORPHONE HCL 1 MG/ML IJ SOLN
0.2500 mg | INTRAMUSCULAR | Status: AC | PRN
  Administered 2021-04-23 (×6): 0.25 mg via INTRAVENOUS

## 2021-04-23 MED ORDER — VANCOMYCIN HCL 750 MG/150ML IV SOLN
750.0000 mg | Freq: Two times a day (BID) | INTRAVENOUS | Status: DC
  Administered 2021-04-23 – 2021-04-24 (×2): 750 mg via INTRAVENOUS
  Filled 2021-04-23 (×3): qty 150

## 2021-04-23 MED ORDER — ALBUMIN HUMAN 5 % IV SOLN
INTRAVENOUS | Status: AC
  Filled 2021-04-23: qty 250

## 2021-04-23 MED ORDER — ONDANSETRON HCL 4 MG/2ML IJ SOLN
INTRAMUSCULAR | Status: DC | PRN
  Administered 2021-04-23: 4 mg via INTRAVENOUS

## 2021-04-23 MED ORDER — CLINDAMYCIN PHOSPHATE 900 MG/50ML IV SOLN
900.0000 mg | INTRAVENOUS | Status: AC
  Administered 2021-04-23: 900 mg via INTRAVENOUS

## 2021-04-23 MED ORDER — TRAMADOL HCL 50 MG PO TABS
50.0000 mg | ORAL_TABLET | Freq: Once | ORAL | Status: DC | PRN
Start: 2021-04-23 — End: 2021-04-23

## 2021-04-23 MED ORDER — ALBUMIN HUMAN 5 % IV SOLN
INTRAVENOUS | Status: AC
  Administered 2021-04-23: 12.5 g via INTRAVENOUS
  Filled 2021-04-23: qty 250

## 2021-04-23 MED ORDER — OXYCODONE HCL 5 MG/5ML PO SOLN: 5.0000 mg | Freq: Once | ORAL | Status: AC | PRN

## 2021-04-23 MED ORDER — SODIUM CHLORIDE 0.9 % IV SOLN
INTRAVENOUS | Status: DC | PRN
Start: 2021-04-23 — End: 2021-04-23

## 2021-04-23 MED ORDER — PROPOFOL 10 MG/ML IV BOLUS
INTRAVENOUS | Status: DC | PRN
  Administered 2021-04-23: 100 mg via INTRAVENOUS

## 2021-04-23 MED ORDER — PROMETHAZINE HCL 25 MG/ML IJ SOLN
6.2500 mg | INTRAMUSCULAR | Status: DC | PRN
Start: 2021-04-23 — End: 2021-04-23

## 2021-04-23 MED ORDER — DROPERIDOL 2.5 MG/ML IJ SOLN
0.6250 mg | Freq: Once | INTRAMUSCULAR | Status: DC | PRN
  Filled 2021-04-23: qty 2

## 2021-04-23 MED ORDER — 0.9 % SODIUM CHLORIDE (POUR BTL) OPTIME
TOPICAL | Status: DC | PRN
  Administered 2021-04-23: 500 mL

## 2021-04-23 MED ORDER — POLYVINYL ALCOHOL 1.4 % OP SOLN
1.0000 [drp] | OPHTHALMIC | Status: DC | PRN
  Administered 2021-04-23 – 2021-04-28 (×2): 1 [drp] via OPHTHALMIC
  Filled 2021-04-23 (×2): qty 15

## 2021-04-23 MED ORDER — ONDANSETRON HCL 4 MG/2ML IJ SOLN
INTRAMUSCULAR | Status: AC
  Filled 2021-04-23: qty 2

## 2021-04-23 SURGICAL SUPPLY — 40 items
BLADE SAGITTAL WIDE XTHICK NO (BLADE) IMPLANT
BLADE SAW SAG 25.4X90 (BLADE) ×2 IMPLANT
BNDG COHESIVE 4X5 TAN STRL (GAUZE/BANDAGES/DRESSINGS) ×2 IMPLANT
BNDG ELASTIC 6X5.8 VLCR NS LF (GAUZE/BANDAGES/DRESSINGS) ×2 IMPLANT
BNDG GAUZE ELAST 4 BULKY (GAUZE/BANDAGES/DRESSINGS) ×4 IMPLANT
BRUSH SCRUB EZ  4% CHG (MISCELLANEOUS) ×1
BRUSH SCRUB EZ 4% CHG (MISCELLANEOUS) ×1 IMPLANT
CHLORAPREP W/TINT 26 (MISCELLANEOUS) ×2 IMPLANT
DRAPE INCISE IOBAN 66X45 STRL (DRAPES) IMPLANT
ELECT CAUTERY BLADE 6.4 (BLADE) ×2 IMPLANT
ELECT REM PT RETURN 9FT ADLT (ELECTROSURGICAL) ×2
ELECTRODE REM PT RTRN 9FT ADLT (ELECTROSURGICAL) ×1 IMPLANT
GAUZE 4X4 16PLY ~~LOC~~+RFID DBL (SPONGE) ×2 IMPLANT
GAUZE XEROFORM 1X8 LF (GAUZE/BANDAGES/DRESSINGS) ×4 IMPLANT
GLOVE SURG ENC MOIS LTX SZ7 (GLOVE) ×2 IMPLANT
GLOVE SURG SYN 7.0 (GLOVE) ×2 IMPLANT
GLOVE SURG SYN 7.0 PF PI (GLOVE) ×1 IMPLANT
GLOVE SURG UNDER LTX SZ7.5 (GLOVE) ×2 IMPLANT
GOWN STRL REUS W/ TWL LRG LVL3 (GOWN DISPOSABLE) ×1 IMPLANT
GOWN STRL REUS W/ TWL XL LVL3 (GOWN DISPOSABLE) ×2 IMPLANT
GOWN STRL REUS W/TWL LRG LVL3 (GOWN DISPOSABLE) ×1
GOWN STRL REUS W/TWL XL LVL3 (GOWN DISPOSABLE) ×2
HANDLE YANKAUER SUCT BULB TIP (MISCELLANEOUS) ×2 IMPLANT
KIT TURNOVER KIT A (KITS) ×2 IMPLANT
LABEL OR SOLS (LABEL) ×2 IMPLANT
MANIFOLD NEPTUNE II (INSTRUMENTS) ×2 IMPLANT
NS IRRIG 1000ML POUR BTL (IV SOLUTION) ×2 IMPLANT
PACK EXTREMITY ARMC (MISCELLANEOUS) ×2 IMPLANT
PAD ABD DERMACEA PRESS 5X9 (GAUZE/BANDAGES/DRESSINGS) ×4 IMPLANT
PAD PREP 24X41 OB/GYN DISP (PERSONAL CARE ITEMS) ×2 IMPLANT
SPONGE T-LAP 18X18 ~~LOC~~+RFID (SPONGE) ×2 IMPLANT
STAPLER SKIN PROX 35W (STAPLE) ×2 IMPLANT
STOCKINETTE M/LG 89821 (MISCELLANEOUS) ×2 IMPLANT
SUT SILK 2 0 (SUTURE) ×1
SUT SILK 2 0 SH (SUTURE) ×4 IMPLANT
SUT SILK 2-0 18XBRD TIE 12 (SUTURE) ×1 IMPLANT
SUT SILK 3 0 (SUTURE) ×1
SUT SILK 3-0 18XBRD TIE 12 (SUTURE) ×1 IMPLANT
SUT VIC AB 0 CT1 36 (SUTURE) ×4 IMPLANT
SUT VIC AB 2-0 CT1 (SUTURE) ×4 IMPLANT

## 2021-04-23 NOTE — Transfer of Care (Signed)
Immediate Anesthesia Transfer of Care Note  Patient: [name]  Procedure(s) Performed: AMPUTATION BELOW KNEE (Left: Knee)  Patient Location: PACU  Anesthesia Type:General  Level of Consciousness: awake and alert   Airway & Oxygen Therapy: Patient Spontanous Breathing and Patient connected to face mask oxygen  Post-op Assessment: Report given to RN and Post -op Vital signs reviewed and stable  Post vital signs: Reviewed and stable  Last Vitals:  Vitals Value Taken Time  BP 103/54 04/23/21 1703  Temp    Pulse 81 04/23/21 1706  Resp 21 04/23/21 1706  SpO2 100 % 04/23/21 1706  Vitals shown include unvalidated device data.  Last Pain:  Vitals:   04/23/21 1437  TempSrc: Temporal  PainSc: 0-No pain         Complications: No notable events documented.

## 2021-04-23 NOTE — Op Note (Signed)
OPERATIVE NOTE   PROCEDURE: Left below-the-knee amputation  PRE-OPERATIVE DIAGNOSIS: Left foot osteomyelitis, nonsalvageable foot  POST-OPERATIVE DIAGNOSIS: same as above  SURGEON: Leotis Pain, MD  ASSISTANT(S): Hezzie Bump, PA-C  ANESTHESIA: general  ESTIMATED BLOOD LOSS: 250 cc  FINDING(S): none  SPECIMEN(S):  Left below-the-knee amputation  INDICATIONS:   [name] is a 70 y.o. male who presents with left leg osteomyelitis.  The patient is scheduled for a left below-the-knee amputation.  I discussed in depth with the patient the risks, benefits, and alternatives to this procedure.  The patient is aware that the risk of this operation included but are not limited to:  bleeding, infection, myocardial infarction, stroke, death, failure to heal amputation wound, and possible need for more proximal amputation.  The patient is aware of the risks and agrees proceed forward with the procedure. An assistant was present during the procedure to help facilitate the exposure and expedite the procedure.   DESCRIPTION:  After full informed written consent was obtained from the patient, the patient was brought back to the operating room, and placed supine upon the operating table.  Prior to induction, the patient received IV antibiotics.  The patient was then prepped and draped in the standard fashion for a below-the-knee amputation. The assistant provided retraction and mobilization to help facilitate exposure and expedite the procedure throughout the entire procedure.  This included following suture, using retractors, and optimizing lighting.  After obtaining adequate anesthesia, the patient was prepped and draped in the standard fashion for a left below-the-knee amputation.  I marked out the anterior incision two finger breadths below the tibial tuberosity and then the marked out a posterior flap that was one third of the circumference of the calf in length.   I made the incisions for these  flaps, and then dissected through the subcutaneous tissue, fascia, and muscle anteriorly.  I elevated  the periosteal tissue superiorly so that the tibia was about 3-4 cm shorter than the anterior skin flap.  I then transected the tibia with a power saw and then took a wedge off the tibia anteriorly with the power saw.  Then I smoothed out the rough edges.  In a similar fashion, I cut back the fibula about two centimeters higher than the level of the tibia with a bone cutter.  I put a bone hook into the distal tibia and then used a large amputation knife to sharply develop a tissue plane through the muscle along the fibula.  In such fashion, the posterior flap was developed.  At this point, the specimen was passed off the field as the below-the-knee amputation.  At this point, I clamped all visibly bleeding arteries and veins using a combination of suture ligation with Silk suture and electrocautery.  Bleeding continued to be controlled with electrocautery and suture ligature.  The stump was washed off with sterile normal saline and no further active bleeding was noted.  I reapproximated the anterior and posterior fascia  with interrupted stitches of 0 Vicryl.  This was completed along the entire length of anterior and posterior fascia until there were no more loose space in the fascial line. I then placed a layer of 2-0 Vicryl sutures in the subcutaneous tissue. The skin was then  reapproximated with staples.  The stump was washed off and dried.  The incision was dressed with Xeroform and  then fluffs were applied.  Kerlix was wrapped around the leg and then gently an ACE wrap was applied.  COMPLICATIONS: none  CONDITION: stable   Leotis Pain  04/23/2021, 4:58 PM    This note was created with Dragon Medical transcription system. Any errors in dictation are purely unintentional.

## 2021-04-23 NOTE — Progress Notes (Signed)
PROGRESS NOTE    [name]  [id number] DOB: [birth year] DOA: 04/20/2021 PCP: [name], MD   Brief Narrative:  70 y.o. male with medical history significant for DM, HTN, PAD s/p prior amputations digits left foot who presents to the emergency room a for evaluation of a nonhealing painful wound of the left heel which he said he noticed just a week prior.  Has a history of osteomyelitis of the left foot in the past.  He denies fever and chills and denies shortness of breath or chest pain  Patient was admitted to the hospitalist service with podiatry and vascular surgery consulted.  Vascular surgery to take patient for angiography and revascularization attempt on 7/27.  Patient is hemodynamically stable.  Imaging survey significant for osteomyelitis of posterior calcaneus with associated bony destruction and diffuse soft tissue swelling consistent with cellulitis.  Remains on antibiotics.  Underwent angiography with vascular surgery on 7/27.  Patent vasculature.  Per podiatry evaluation left lower extremity is likely not salvageable.  Vascular surgery plans for left BKA on 7/28   Assessment & Plan:   Principal Problem:   Diabetic foot infection ([place]) Active Problems:   PAD (peripheral artery disease) ([place])   Essential hypertension   Uncontrolled type 2 diabetes mellitus with hyperglycemia (HCC)   Acute osteomyelitis of left foot (HCC)   Anemia   Elevated lactic acid level   Hyponatremia  Acute osteomyelitis left foot Diabetic ulcer left foot Peripheral arterial disease Patient complains of wound to left heel worsening over the past week History of osteomyelitis on affected extremity with history of palpitations Vascular surgery podiatry consulted from ED Status post angiography 7/27, patent vasculature Left foot likely not salvageable Plan for left BKA 7/28 Plan: Continue broad-spectrum antibiotics.  Continue statin and aspirin.  Plavix on hold for now.  Patient has  been n.p.o. after midnight.  Plan for left BKA today.  Can likely discontinue antibiotics after surgery.  Essential hypertension PTA metoprolol  Type 2 diabetes mellitus uncontrolled with hyperglycemia Last hemoglobin A1c in May was 7.1 Plan: Lantus 8 units daily NovoLog 3 units 3 times daily with meals Moderate sliding scale Nightly coverage Carb modified diet Uptitrate insulin regimen as necessary Diabetes coordinator following  Anemia of chronic disease Unclear underlying disease Suspect PAD versus uncontrolled diabetes Hemoglobin low but stable No indication for transfusion  Chronic hyponatremia Likely euvolemic hyponatremia Continue to monitor   DVT prophylaxis: SQ Lovenox Code Status: Full Family Communication: None today Disposition Plan: Status is: Inpatient  Remains inpatient appropriate because:Inpatient level of care appropriate due to severity of illness  Dispo: The patient is from: Home              Anticipated d/c is to: Home              Patient currently is not medically stable to d/c.   Difficult to place patient No  N.p.o. for left BKA today     Level of care: Med-Surg  Consultants:  Vascular surgery Podiatry  Procedures:  Angiography 7/27 Left BKA 7/28  Antimicrobials:  Vancomycin Rocephin Metronidazole   Subjective: Patient seen and examined.  No visible distress.  No pain complaints.  In good spirits.  Objective: Vitals:   04/22/21 1942 04/23/21 0409 04/23/21 0724 04/23/21 1142  BP: 106/60 108/70 109/71 133/76  Pulse: 86 90 90 81  Resp: '19 19 16 15  '$ Temp: 97.8 F (36.6 C) 97.7 F (36.5 C) 98.2 F (36.8 C) 98.3 F (36.8 C)  TempSrc: Oral Oral    SpO2: 100% 96% 98% 97%  Weight:      Height:        Intake/Output Summary (Last 24 hours) at 04/23/2021 1325 Last data filed at 04/23/2021 0420 Gross per 24 hour  Intake 240 ml  Output 1050 ml  Net -810 ml   Filed Weights   04/20/21 1407  Weight: 72.6 kg     Examination:  General exam: No acute distress.  In good spirits Respiratory system: Clear to auscultation. Respiratory effort normal. Cardiovascular system: S1-S2, regular rate and rhythm, no murmurs, no pedal edema  gastrointestinal system: Soft, nontender, nondistended, normal bowel sounds Central nervous system: Alert and oriented. No focal neurological deficits. Extremities: Decreased power left lower extremity.  Left foot multiple toe amputations.  And 5cm left heel ulcer.  Weak bilateral pedal pulses Skin: No rashes, lesions or ulcers Psychiatry: Judgement and insight appear normal. Mood & affect appropriate.     Data Reviewed: I have personally reviewed following labs and imaging studies  CBC: Recent Labs  Lab 04/20/21 1409 04/21/21 0625 04/23/21 0442  WBC 7.5 6.7 3.8*  NEUTROABS 5.8  --   --   HGB 9.6* 8.4* 8.9*  HCT 29.6* 26.6* 27.8*  MCV 80.0 81.8 79.0*  PLT 232 219 [id number]   Basic Metabolic Panel: Recent Labs  Lab 04/20/21 1409 04/21/21 0625 04/22/21 0612 04/23/21 0442  NA 130* 132* 133* 135  K 4.9 4.1 4.4 4.0  CL 96* 98 98 103  CO2 '25 25 27 24  '$ GLUCOSE 343* 169* 253* 207*  BUN '21 15 17 19  '$ CREATININE 1.04 0.90 0.87 0.98  CALCIUM 8.6* 8.5* 8.6* 8.2*  MG  --   --   --  1.9   GFR: Estimated Creatinine Clearance: 75.1 mL/min (by C-G formula based on SCr of 0.98 mg/dL). Liver Function Tests: Recent Labs  Lab 04/20/21 1409  AST 17  ALT 14  ALKPHOS 76  BILITOT 0.6  PROT 7.1  ALBUMIN 2.7*   No results for input(s): LIPASE, AMYLASE in the last 168 hours. No results for input(s): AMMONIA in the last 168 hours. Coagulation Profile: Recent Labs  Lab 04/23/21 0442  INR 1.1   Cardiac Enzymes: No results for input(s): CKTOTAL, CKMB, CKMBINDEX, TROPONINI in the last 168 hours. BNP (last 3 results) No results for input(s): PROBNP in the last 8760 hours. HbA1C: Recent Labs    04/20/21 1409  HGBA1C 8.3*   CBG: Recent Labs  Lab 04/22/21 1216  04/22/21 1627 04/22/21 2121 04/23/21 0725 04/23/21 1232  GLUCAP 158* 207* 246* 195* 178*   Lipid Profile: No results for input(s): CHOL, HDL, LDLCALC, TRIG, CHOLHDL, LDLDIRECT in the last 72 hours. Thyroid Function Tests: No results for input(s): TSH, T4TOTAL, FREET4, T3FREE, THYROIDAB in the last 72 hours. Anemia Panel: No results for input(s): VITAMINB12, FOLATE, FERRITIN, TIBC, IRON, RETICCTPCT in the last 72 hours. Sepsis Labs: Recent Labs  Lab 04/20/21 1409 04/20/21 2130  LATICACIDVEN 2.2* 1.6    Recent Results (from the past 240 hour(s))  Resp Panel by RT-PCR (Flu A&B, Covid) Nasopharyngeal Swab     Status: None   Collection Time: 04/20/21 10:38 PM   Specimen: Nasopharyngeal Swab; Nasopharyngeal(NP) swabs in vial transport medium  Result Value Ref Range Status   SARS Coronavirus 2 by RT PCR NEGATIVE NEGATIVE Final    Comment: (NOTE) SARS-CoV-2 target nucleic acids are NOT DETECTED.  The SARS-CoV-2 RNA is generally detectable in upper respiratory specimens during the acute phase of  infection. The lowest concentration of SARS-CoV-2 viral copies this assay can detect is 138 copies/mL. A negative result does not preclude SARS-Cov-2 infection and should not be used as the sole basis for treatment or other patient management decisions. A negative result may occur with  improper specimen collection/handling, submission of specimen other than nasopharyngeal swab, presence of viral mutation(s) within the areas targeted by this assay, and inadequate number of viral copies(<138 copies/mL). A negative result must be combined with clinical observations, patient history, and epidemiological information. The expected result is Negative.  Fact Sheet for Patients:  EntrepreneurPulse.com.au  Fact Sheet for Healthcare Providers:  IncredibleEmployment.be  This test is no t yet approved or cleared by the [place] FDA and  has been authorized  for detection and/or diagnosis of SARS-CoV-2 by FDA under an Emergency Use Authorization (EUA). This EUA will remain  in effect (meaning this test can be used) for the duration of the COVID-19 declaration under Section 564(b)(1) of the Act, 21 U.S.C.section 360bbb-3(b)(1), unless the authorization is terminated  or revoked sooner.       Influenza A by PCR NEGATIVE NEGATIVE Final   Influenza B by PCR NEGATIVE NEGATIVE Final    Comment: (NOTE) The Xpert Xpress SARS-CoV-2/FLU/RSV plus assay is intended as an aid in the diagnosis of influenza from Nasopharyngeal swab specimens and should not be used as a sole basis for treatment. Nasal washings and aspirates are unacceptable for Xpert Xpress SARS-CoV-2/FLU/RSV testing.  Fact Sheet for Patients: EntrepreneurPulse.com.au  Fact Sheet for Healthcare Providers: IncredibleEmployment.be  This test is not yet approved or cleared by the [place] FDA and has been authorized for detection and/or diagnosis of SARS-CoV-2 by FDA under an Emergency Use Authorization (EUA). This EUA will remain in effect (meaning this test can be used) for the duration of the COVID-19 declaration under Section 564(b)(1) of the Act, 21 U.S.C. section 360bbb-3(b)(1), unless the authorization is terminated or revoked.  Performed at [facility], [address] [place], [place] [address]          Radiology Studies: PERIPHERAL VASCULAR CATHETERIZATION  Result Date: 04/22/2021 See surgical note for result.       Scheduled Meds:  aspirin EC  81 mg Oral Daily   enoxaparin (LOVENOX) injection  40 mg Subcutaneous Q24H   gabapentin  300 mg Oral TID   insulin aspart  0-15 Units Subcutaneous TID WC   insulin aspart  0-5 Units Subcutaneous QHS   [START ON 04/24/2021] insulin glargine-yfgn  8 Units Subcutaneous Daily   ketorolac  15 mg Intravenous Q6H   metoprolol succinate  50 mg Oral Daily    mometasone-formoterol  2 puff Inhalation BID   traMADol  50 mg Oral QHS   Continuous Infusions:  sodium chloride 75 mL/hr at 04/23/21 0157   cefTRIAXone (ROCEPHIN)  IV 2 g (04/23/21 0952)   metronidazole 500 mg (04/23/21 1301)   vancomycin       LOS: 3 days    Time spent: 25 minutes    Sidney Ace, MD [facility] Pager [phone]  If 7PM-7AM, please contact night-coverage 04/23/2021, 1:25 PM

## 2021-04-23 NOTE — Progress Notes (Addendum)
Chronic Care Management Pharmacy Assistant   Name: [name]  MRN: [id number] DOB: 1950/12/30  Reason for Encounter: Adherence Review  Medications: Facility-Administered Encounter Medications as of 04/23/2021  Medication   0.9 %  sodium chloride infusion   [MAR Hold] acetaminophen (TYLENOL) tablet 650 mg   Or   [MAR Hold] acetaminophen (TYLENOL) suppository 650 mg   [MAR Hold] aspirin EC tablet 81 mg   [MAR Hold] cefTRIAXone (ROCEPHIN) 2 g in sodium chloride 0.9 % 100 mL IVPB   Chlorhexidine Gluconate Cloth 2 % PADS 6 each   And   Chlorhexidine Gluconate Cloth 2 % PADS 6 each   [START ON 04/24/2021] clindamycin (CLEOCIN) IVPB 900 mg   [MAR Hold] cyclobenzaprine (FLEXERIL) tablet 10 mg   [MAR Hold] diphenhydrAMINE (BENADRYL) capsule 25 mg   [MAR Hold] enoxaparin (LOVENOX) injection 40 mg   [MAR Hold] gabapentin (NEURONTIN) capsule 300 mg   [MAR Hold] HYDROcodone-acetaminophen (NORCO/VICODIN) 5-325 MG per tablet 1-2 tablet   [MAR Hold] HYDROmorphone (DILAUDID) injection 1 mg   [MAR Hold] insulin aspart (novoLOG) injection 0-15 Units   [MAR Hold] insulin aspart (novoLOG) injection 0-5 Units   [MAR Hold] insulin glargine-yfgn (SEMGLEE) injection 8 Units   [MAR Hold] ketorolac (TORADOL) 15 MG/ML injection 15 mg   [MAR Hold] metoprolol succinate (TOPROL-XL) 24 hr tablet 50 mg   [MAR Hold] metroNIDAZOLE (FLAGYL) IVPB 500 mg   [MAR Hold] mometasone-formoterol (DULERA) 100-5 MCG/ACT inhaler 2 puff   [MAR Hold] morphine 2 MG/ML injection 2 mg   [MAR Hold] ondansetron (ZOFRAN) tablet 4 mg   Or   [MAR Hold] ondansetron (ZOFRAN) injection 4 mg   [MAR Hold] ondansetron (ZOFRAN) injection 4 mg   [MAR Hold] polyvinyl alcohol (LIQUIFILM TEARS) 1.4 % ophthalmic solution 1 drop   [MAR Hold] simethicone (MYLICON) chewable tablet 80 mg   [MAR Hold] traMADol (ULTRAM) tablet 50 mg   [MAR Hold] vancomycin (VANCOREADY) IVPB 750 mg/150 mL   Outpatient Encounter Medications as of 04/23/2021   Medication Sig Note   acetaminophen (TYLENOL) 325 MG tablet Take 650 mg by mouth every 6 (six) hours as needed for moderate pain.    Alcohol Swabs (B-D SINGLE USE SWABS REGULAR) PADS by Does not apply route. Use as directed    aspirin EC 81 MG tablet Take 81 mg by mouth daily.    Blood Glucose Calibration (TRUE METRIX LEVEL 1) Low SOLN Use as directed when needed dx E11.65    Blood Glucose Monitoring Suppl (TRUE METRIX GO GLUCOSE METER) w/Device KIT Use as directed to check glucose DX e11.65    Calcium Carbonate-Vitamin D (CALCIUM PLUS VITAMIN D PO) Take 1 tablet by mouth daily.    clopidogrel (PLAVIX) 75 MG tablet Take 1 tablet (75 mg total) by mouth daily.    Continuous Blood Gluc Receiver (DEXCOM G6 RECEIVER) DEVI Use as directed every 14 days to monitor blood sugars    Continuous Blood Gluc Sensor (DEXCOM G6 SENSOR) MISC Use as directed every 14 days to monitor blood sugars    Continuous Blood Gluc Transmit (DEXCOM G6 TRANSMITTER) MISC Use as directed every 14 days to monitor blood sugars    cyclobenzaprine (FLEXERIL) 10 MG tablet Take 1 tablet (10 mg total) by mouth 3 (three) times daily as needed for muscle spasms.    diphenhydrAMINE (BENADRYL) 25 mg capsule Take 25 mg by mouth every 6 (six) hours as needed for allergies.    gabapentin (NEURONTIN) 300 MG capsule Take 1 capsule (300 mg total) by  mouth 3 (three) times daily.    glucose blood (TRUE METRIX BLOOD GLUCOSE TEST) test strip Use as directed twice dailyDx E11.65    glyBURIDE (DIABETA) 5 MG tablet Take 1 tablet (5 mg total) by mouth 2 (two) times daily with a meal.    Lancets (ACCU-CHEK SOFT TOUCH) lancets Use as instructed    lovastatin (MEVACOR) 10 MG tablet Take 1 tab  Po QHS    meloxicam (MOBIC) 15 MG tablet Take 15 mg by mouth daily.    metoprolol succinate (TOPROL-XL) 50 MG 24 hr tablet Take 1 tablet (50 mg total) by mouth daily. Take with or immediately following a meal.    mometasone-formoterol (DULERA) 100-5 MCG/ACT AERO  Inhale 2 puffs into the lungs 2 (two) times daily as needed for wheezing.    pantoprazole (PROTONIX) 40 MG tablet Take 1 tablet (40 mg total) by mouth daily. (Patient not taking: Reported on 04/21/2021)    traMADol (ULTRAM) 50 MG tablet TAKE 1 TABLET BY MOUTH EVERY 12 HOURS ASNEEDED FOR PAIN 04/21/2021: Pt takes differently: Take once a day at night.   TRUEplus Lancets 30G MISC by Does not apply route. Use as directed twice a daily Dx E11.65    Reviewed the patients chart for any medical/health changes and/or medication changes there were not any at this time. Per patient chart he is having surgery today (04/23/21) for a amputation below the left knee.  Consul Visit: 04/20/21 Podiatry Cherrie Gauze, DPM For foot ulcer. No medication changes. Per note: Advised to go to the ER.  Hospital Visit: 04/20/21 [facility] (3 Days-Current) Sidney Ace, MD. For Diabetic foot infection.   Follow-Up:Pharmacist Review  Charlann Lange, [place] Pharmacist Assistant [phone]

## 2021-04-23 NOTE — Anesthesia Preprocedure Evaluation (Signed)
Anesthesia Evaluation  Patient identified by MRN, date of birth, ID band Patient awake    Reviewed: Allergy & Precautions, H&P , NPO status , Patient's Chart, lab work & pertinent test results  Airway Mallampati: I  TM Distance: >3 FB Neck ROM: Full    Dental  (+) Edentulous Lower, Edentulous Upper   Pulmonary COPD (2L at night),  COPD inhaler and oxygen dependent, former smoker,           Cardiovascular Exercise Tolerance: Good hypertension, + Peripheral Vascular Disease (Atherosclerotic Disease With Ulceration)  + dysrhythmias (Paroxysmal SVT)  Rhythm:Regular Rate:Normal + Systolic murmurs TEE [id number]: 1. Left ventricular ejection fraction, by estimation, is 50 to 55%. The  left ventricle has low normal function.  2. Right ventricular systolic function is normal. The right ventricular  size is normal.  3. Left atrial size was mildly dilated. No left atrial/left atrial  appendage thrombus was detected.  4. Right atrial size was mildly dilated.  5. The mitral valve is normal in structure. Mild mitral valve  regurgitation.  6. The aortic valve is normal in structure. Aortic valve regurgitation is  trivial.  7. There is mild (Grade II) plaque.  8. Agitated saline contrast bubble study was negative, with no evidence  of any interatrial shunt   Neuro/Psych S/p CEA  Neuromuscular disease (Diabetic Neuropathy) negative psych ROS   GI/Hepatic Neg liver ROS, GERD  ,  Endo/Other  diabetes ([facility] inpatient), Well Controlled, Type 2, Oral Hypoglycemic Agents  Renal/GU negative Renal ROS  negative genitourinary   Musculoskeletal  (+) Arthritis ,   Abdominal Normal abdominal exam  (+)   Peds  Hematology negative hematology ROS (+) anemia ,   Anesthesia Other Findings     Paroxysmal SVT (supraventricular tachycardia) (HCC) PAD (peripheral artery disease) (HCC) Bilateral carotid artery stenosis Diabetes (HCC) Carotid  stenosis, right Mixed hyperlipidemia Essential hypertension GERD (gastroesophageal reflux disease) Uncontrolled type 2 diabetes mellitus with hyperglycemia (HCC) Acute non-recurrent pansinusitis Primary osteoarthritis of shoulders, bilateral Sore throat Oral candidiasis Atherosclerosis of native arteries of the extremities with gangrene (HCC) Need for vaccination against Streptococcus pneumoniae using pneumococcal conjugate vaccine 13 Overactive bladder Atherosclerosis of artery of extremity with ulceration (HCC) Arthritis Cardiomyopathy, nonischemic (HCC) Chest pain with low risk for cardiac etiology H/O seasonal allergies Encounter for general adult medical examination with abnormal findings Dysuria Primary generalized (osteo)arthritis Bilateral impacted cerumen Acute otitis externa of both ears Moderate asthma without complication Sepsis ([place]) Diabetic foot ulcer ([place]) Diabetic foot infection ([place]) Bacteremia due to Enterococcus Bacteremia due to methicillin susceptible Staphylococcus aureus (MSSA) Acute osteomyelitis of left foot (HCC) Anemia Elevated lactic acid level Hyponatremia    Reproductive/Obstetrics negative OB ROS                             Anesthesia Physical  Anesthesia Plan  ASA: III  Anesthesia Plan: General   Post-op Pain Management:    Induction: Intravenous  PONV Risk Score and Plan: 3 and Treatment may vary due to age or medical condition, Ondansetron and Dexamethasone  Airway Management Planned: Oral ETT  Additional Equipment:   Intra-op Plan:   Post-operative Plan: Extubation in OR  Informed Consent: I have reviewed the patients History and Physical, chart, labs and discussed the procedure including the risks, benefits and alternatives for the proposed anesthesia with the patient or authorized representative who has indicated his/her understanding and acceptance.     Dental Advisory Given  Plan Discussed  with: Anesthesiologist and CRNA  Anesthesia Plan Comments:         Anesthesia Quick Evaluation

## 2021-04-23 NOTE — Anesthesia Postprocedure Evaluation (Signed)
Anesthesia Post Note  Patient: [name]  Procedure(s) Performed: AMPUTATION BELOW KNEE (Left: Knee)  Patient location during evaluation: PACU Anesthesia Type: General Level of consciousness: awake and alert Pain management: pain level controlled Vital Signs Assessment: post-procedure vital signs reviewed and stable Respiratory status: spontaneous breathing, nonlabored ventilation, respiratory function stable and patient connected to nasal cannula oxygen Cardiovascular status: blood pressure returned to baseline and stable Postop Assessment: no apparent nausea or vomiting Anesthetic complications: no   No notable events documented.   Last Vitals:  Vitals:   04/23/21 1900 04/23/21 1915  BP: 107/62 128/72  Pulse: 88 88  Resp: 17 17  Temp:    SpO2: 100% 100%    Last Pain:  Vitals:   04/23/21 1900  TempSrc:   PainSc: 8                  Precious Haws Wanell Lorenzi

## 2021-04-23 NOTE — Consult Note (Signed)
Pharmacy Antibiotic Note  [name] is a 70 y.o. male admitted on 04/20/2021 with cellulitis. Patient with nonhealing wound to left heel. He has a history of osteomyelitis in the same foot. He saw podiatry outpatient where he was sent to the hospital as he had a large necrotic heel ulceration that was probed directly down the the bone. Imaging consistent with osteomyelitis of posterior calcaneus with associated bony destruction. Pharmacy has been consulted for Vancomycin dosing.  Scr: 1.04>>0.87>>0.98  Plan: Will adjust vancomycin dose to 750 mg IV q12h. Goal AUC 400-550 Expected AUC: 430 SCr used: 0.98 Actual BW: 72.6    Height: '5\' 10"'$  (177.8 cm) Weight: 72.6 kg (160 lb 0.9 oz) IBW/kg (Calculated) : 73  Temp (24hrs), Avg:98 F (36.7 C), Min:97.7 F (36.5 C), Max:98.4 F (36.9 C)  Recent Labs  Lab 04/20/21 1409 04/20/21 2130 04/21/21 0625 04/22/21 0612 04/23/21 0442  WBC 7.5  --  6.7  --  3.8*  CREATININE 1.04  --  0.90 0.87 0.98  LATICACIDVEN 2.2* 1.6  --   --   --      Estimated Creatinine Clearance: 75.1 mL/min (by C-G formula based on SCr of 0.98 mg/dL).    Allergies  Allergen Reactions   Penicillins Rash and Other (See Comments)    Rash in and around the mouth Has patient had a PCN reaction causing immediate rash, facial/tongue/throat swelling, SOB or lightheadedness with hypotension: Yes Has patient had a PCN reaction causing severe rash involving mucus membranes or skin necrosis: Yes Has patient had a PCN reaction that required hospitalization: No Has patient had a PCN reaction occurring within the last 10 years: Yes If all of the above answers are "NO", then may proceed with Cephalosporin use.    Bactrim [Sulfamethoxazole-Trimethoprim] Other (See Comments)    Mouth sores, Sores develop in mouth    Antimicrobials this admission: Vancomycin 7/25 >> Ceftriaxone 7/26 >> Metronidazole 7/26 >>  Dose adjustments this admission: 7/27 Vanc 1500 mg q24h >>  1000 mg q12h 7/28 Vanc '750mg'$  q12h  Microbiology results: N/A   Thank you for allowing pharmacy to be a part of this patient's care.  Pernell Dupre, PharmD, BCPS Clinical Pharmacist 04/23/2021 8:56 AM

## 2021-04-23 NOTE — Interval H&P Note (Signed)
History and Physical Interval Note:  04/23/2021 2:54 PM  [name]  has presented today for surgery, with the diagnosis of Atherosclerotic Disease With Ulceration.  The various methods of treatment have been discussed with the patient and family. After consideration of risks, benefits and other options for treatment, the patient has consented to  Procedure(s): AMPUTATION BELOW KNEE (Left) as a surgical intervention.  The patient's history has been reviewed, patient examined, no change in status, stable for surgery.  I have reviewed the patient's chart and labs.  Questions were answered to the patient's satisfaction.     Leotis Pain

## 2021-04-23 NOTE — Anesthesia Procedure Notes (Signed)
Procedure Name: LMA Insertion Date/Time: 04/23/2021 3:40 PM Performed by: Demetrius Charity, CRNA Pre-anesthesia Checklist: Patient identified, Patient being monitored, Timeout performed, Emergency Drugs available and Suction available Patient Re-evaluated:Patient Re-evaluated prior to induction Oxygen Delivery Method: Circle system utilized Preoxygenation: Pre-oxygenation with 100% oxygen Induction Type: IV induction Ventilation: Mask ventilation without difficulty LMA: LMA inserted LMA Size: 4.0 Tube type: Oral Number of attempts: 1 Placement Confirmation: positive ETCO2 and breath sounds checked- equal and bilateral Tube secured with: Tape Dental Injury: Teeth and Oropharynx as per pre-operative assessment

## 2021-04-23 NOTE — Progress Notes (Signed)
Inpatient Diabetes Program Recommendations  AACE/ADA: New Consensus Statement on Inpatient Glycemic Control   Target Ranges:  Prepandial:   less than 140 mg/dL      Peak postprandial:   less than 180 mg/dL (1-2 hours)      Critically ill patients:  140 - 180 mg/dL   Results for SALOMON, ISIP (MRN [id number]) as of 04/23/2021 11:31  Ref. Range 04/22/2021 07:54 04/22/2021 10:16 04/22/2021 11:14 04/22/2021 12:16 04/22/2021 16:27 04/22/2021 21:21 04/23/2021 07:25  Glucose-Capillary Latest Ref Range: 70 - 99 mg/dL 252 (H) 160 (H) 144 (H) 158 (H) 207 (H) 246 (H) 195 (H)   Review of Glycemic Control  Diabetes history: DM2 Outpatient Diabetes medications: Glyburdie 5 mg BID Current orders for Inpatient glycemic control: Semglee 5 units daily, Novolog 0-15 units TID with meals, Novolog 0-5 units QHS  Inpatient Diabetes Program Recommendations:    Insulin: Noted patient NPO and to have surgery today. Please consider increasing Semglee to 8 units daily and once diet is resumed ordering Novolog 3 units TID with meals for meal coverage if patient eats at least 50% of meals.  Thanks, Barnie Alderman, RN, MSN, CDE Diabetes Coordinator Inpatient Diabetes Program [phone] (Team Pager from 8am to 5pm)

## 2021-04-24 ENCOUNTER — Encounter: Payer: Self-pay | Admitting: Vascular Surgery

## 2021-04-24 DIAGNOSIS — L089 Local infection of the skin and subcutaneous tissue, unspecified: Secondary | ICD-10-CM | POA: Diagnosis not present

## 2021-04-24 DIAGNOSIS — E11628 Type 2 diabetes mellitus with other skin complications: Secondary | ICD-10-CM | POA: Diagnosis not present

## 2021-04-24 LAB — BASIC METABOLIC PANEL
Anion gap: 5 (ref 5–15)
BUN: 23 mg/dL (ref 8–23)
CO2: 22 mmol/L (ref 22–32)
Calcium: 7.6 mg/dL — ABNORMAL LOW (ref 8.9–10.3)
Chloride: 106 mmol/L (ref 98–111)
Creatinine, Ser: 0.99 mg/dL (ref 0.61–1.24)
GFR, Estimated: >60 mL/min (ref >60)
Glucose, Bld: 293 mg/dL — ABNORMAL HIGH (ref 70–99)
Potassium: 5.1 mmol/L (ref 3.5–5.1)
Sodium: 133 mmol/L — ABNORMAL LOW (ref 135–145)

## 2021-04-24 LAB — GLUCOSE, CAPILLARY
Glucose-Capillary: 326 mg/dL — ABNORMAL HIGH (ref 70–99)
Glucose-Capillary: 263 mg/dL — ABNORMAL HIGH (ref 70–99)
Glucose-Capillary: 207 mg/dL — ABNORMAL HIGH (ref 70–99)
Glucose-Capillary: 210 mg/dL — ABNORMAL HIGH (ref 70–99)

## 2021-04-24 LAB — CBC
HCT: 26.6 % — ABNORMAL LOW (ref 39.0–52.0)
Hemoglobin: 8.6 g/dL — ABNORMAL LOW (ref 13.0–17.0)
MCH: 26.1 pg (ref 26.0–34.0)
MCHC: 32.3 g/dL (ref 30.0–36.0)
MCV: 80.6 fL (ref 80.0–100.0)
Platelets: 236 10*3/uL (ref 150–400)
RBC: 3.3 MIL/uL — ABNORMAL LOW (ref 4.22–5.81)
RDW: 14.8 % (ref 11.5–15.5)
WBC: 5 10*3/uL (ref 4.0–10.5)
nRBC: 0 % (ref 0.0–0.2)

## 2021-04-24 MED ORDER — INSULIN GLARGINE-YFGN 100 UNIT/ML ~~LOC~~ SOLN
10.0000 [IU] | Freq: Every day | SUBCUTANEOUS | Status: DC
  Administered 2021-04-25 – 2021-04-28 (×4): 10 [IU] via SUBCUTANEOUS
  Filled 2021-04-24 (×4): qty 0.1

## 2021-04-24 MED ORDER — CLOPIDOGREL BISULFATE 75 MG PO TABS
75.0000 mg | ORAL_TABLET | Freq: Every day | ORAL | Status: DC
  Administered 2021-04-24 – 2021-04-28 (×5): 75 mg via ORAL
  Filled 2021-04-24 (×5): qty 1

## 2021-04-24 MED ORDER — ENSURE MAX PROTEIN PO LIQD
11.0000 [oz_av] | Freq: Two times a day (BID) | ORAL | Status: DC
  Administered 2021-04-24 – 2021-04-28 (×8): 11 [oz_av] via ORAL
  Filled 2021-04-24: qty 330

## 2021-04-24 MED ORDER — JUVEN PO PACK
1.0000 | PACK | Freq: Two times a day (BID) | ORAL | Status: DC
  Administered 2021-04-25 – 2021-04-28 (×7): 1 via ORAL

## 2021-04-24 MED ORDER — GLUCERNA SHAKE PO LIQD
237.0000 mL | ORAL | Status: DC
  Administered 2021-04-25 – 2021-04-27 (×3): 237 mL via ORAL

## 2021-04-24 MED ORDER — ASCORBIC ACID 500 MG PO TABS
500.0000 mg | ORAL_TABLET | Freq: Every day | ORAL | Status: DC
  Administered 2021-04-24 – 2021-04-28 (×5): 500 mg via ORAL
  Filled 2021-04-24 (×5): qty 1

## 2021-04-24 NOTE — TOC Progression Note (Signed)
Transition of Care [facility]) - Progression Note    Patient Details  Name: [name] MRN: [id number] Date of Birth: [birth year]  Transition of Care [facility]) CM/SW [place], RN Phone Number: 04/24/2021, 1:49 PM  Clinical Narrative:     Met with the patient and his wife, they are agreeable to go to [facility], [facility] sent, will review bed offers once obtained       Expected Discharge Plan and Services                                                 Social Determinants of Health (SDOH) Interventions    Readmission Risk Interventions No flowsheet data found.

## 2021-04-24 NOTE — NC FL2 (Signed)
[place] LEVEL OF CARE SCREENING TOOL     IDENTIFICATION  Patient Name: [name] Birthdate: 03-04-1951 Sex: male Admission Date (Current Location): 04/20/2021  [facility] and [place] Number:  Engineering geologist and Address:  [facility], [address], [place], [place] [address]      Provider Number: [id number]  Attending Physician Name and Address:  Sidney Ace, MD  Relative Name and Phone Number:  Justine Null [phone]    Current Level of Care: Hospital Recommended Level of Care: [place] Prior Approval Number:    Date Approved/Denied:   PASRR Number: [id number]  Discharge Plan: SNF    Current Diagnoses: Patient Active Problem List   Diagnosis Date Noted   Acute osteomyelitis of left foot ([place]) 04/20/2021   Anemia 04/20/2021   Elevated lactic acid level 04/20/2021   Hyponatremia 04/20/2021   Bacteremia due to Enterococcus    Bacteremia due to methicillin susceptible Staphylococcus aureus (MSSA)    Diabetic foot infection ([place])    Sepsis ([place]) 11/11/2020   Diabetic foot ulcer ([place]) 11/11/2020   Moderate asthma without complication [id number]   Bilateral impacted cerumen 08/09/2020   Acute otitis externa of both ears 08/09/2020   Primary generalized (osteo)arthritis 03/20/2020   Dysuria 06/15/2019   Atherosclerosis of artery of extremity with ulceration ([place]) 11/30/2018   Need for vaccination against Streptococcus pneumoniae using pneumococcal conjugate vaccine 13 10/26/2018   Overactive bladder 10/26/2018   Atherosclerosis of native arteries of the extremities with gangrene ([place]) 10/25/2018   Sore throat 04/03/2018   Oral candidiasis 04/03/2018   Uncontrolled type 2 diabetes mellitus with hyperglycemia ([place]) 03/06/2018   Acute non-recurrent pansinusitis 03/06/2018   Primary osteoarthritis of shoulders, bilateral 03/06/2018   GERD (gastroesophageal reflux disease) 11/03/2017   Carotid  stenosis, right 05/12/2017   Encounter for general adult medical examination with abnormal findings 03/25/2017   PAD (peripheral artery disease) ([place]) 03/15/2017   Bilateral carotid artery stenosis 03/15/2017   Cardiomyopathy, nonischemic ([place]) 07/28/2015   Chest pain with low risk for cardiac etiology 12/04/2014   Paroxysmal SVT (supraventricular tachycardia) ([place]) 06/13/2014   Diabetes ([place]) 06/13/2014   Mixed hyperlipidemia 06/13/2014   Essential hypertension 06/13/2014   Arthritis 06/13/2014   H/O seasonal allergies 06/13/2014    Orientation RESPIRATION BLADDER Height & Weight     Self, Time, Situation, Place  Normal Continent Weight: 72.6 kg Height:  '5\' 10"'$  (177.8 cm)  BEHAVIORAL SYMPTOMS/MOOD NEUROLOGICAL BOWEL NUTRITION STATUS      Continent Diet (carb modified)  AMBULATORY STATUS COMMUNICATION OF NEEDS Skin   Extensive Assist Verbally Surgical wounds                       Personal Care Assistance Level of Assistance  Feeding, Bathing, Dressing Bathing Assistance: Limited assistance Feeding assistance: Independent Dressing Assistance: Limited assistance     Functional Limitations Info             SPECIAL CARE FACTORS FREQUENCY  PT (By licensed PT), OT (By licensed OT)     PT Frequency: 5 times per week OT Frequency: 5 times per week            Contractures Contractures Info: Not present    Additional Factors Info  Code Status, Allergies Code Status Info: full Allergies Info: bactrim, penicillin           Current Medications (04/24/2021):  This is the current hospital active medication list Current Facility-Administered Medications  Medication Dose Route Frequency Provider Last Rate Last Admin   acetaminophen (TYLENOL) tablet 650 mg  650 mg Oral Q6H PRN [name], MD       Or   acetaminophen (TYLENOL) suppository 650 mg  650 mg Rectal Q6H PRN [name], MD       aspirin EC tablet 81 mg  81 mg Oral Daily [name], MD   81 mg at  04/24/21 0800   clopidogrel (PLAVIX) tablet 75 mg  75 mg Oral Daily Sreenath, Sudheer B, MD       cyclobenzaprine (FLEXERIL) tablet 10 mg  10 mg Oral TID PRN [name], MD       diphenhydrAMINE (BENADRYL) capsule 25 mg  25 mg Oral Q6H PRN [name], MD       enoxaparin (LOVENOX) injection 40 mg  40 mg Subcutaneous Q24H [name], MD   40 mg at 04/23/21 2157   gabapentin (NEURONTIN) capsule 300 mg  300 mg Oral TID [name], MD   300 mg at 04/24/21 0800   HYDROcodone-acetaminophen (NORCO/VICODIN) 5-325 MG per tablet 1-2 tablet  1-2 tablet Oral Q4H PRN [name], MD   2 tablet at 04/24/21 0800   insulin aspart (novoLOG) injection 0-15 Units  0-15 Units Subcutaneous TID WC [name], MD   8 Units at 04/24/21 1219   insulin aspart (novoLOG) injection 0-5 Units  0-5 Units Subcutaneous QHS [name], MD   2 Units at 04/23/21 2156   [START ON 04/25/2021] insulin glargine-yfgn (SEMGLEE) injection 10 Units  10 Units Subcutaneous Daily Sreenath, Sudheer B, MD       ketorolac (TORADOL) 15 MG/ML injection 15 mg  15 mg Intravenous Q6H [name], MD   15 mg at 04/24/21 1219   metoprolol succinate (TOPROL-XL) 24 hr tablet 50 mg  50 mg Oral Daily [name], MD   50 mg at 04/24/21 0800   mometasone-formoterol (DULERA) 100-5 MCG/ACT inhaler 2 puff  2 puff Inhalation BID [name], MD   2 puff at 04/24/21 0758   morphine 2 MG/ML injection 2 mg  2 mg Intravenous Q2H PRN [name], MD   2 mg at 04/24/21 1100   ondansetron (ZOFRAN) tablet 4 mg  4 mg Oral Q6H PRN [name], MD       Or   ondansetron (ZOFRAN) injection 4 mg  4 mg Intravenous Q6H PRN Dew, Erskine Squibb, MD       polyvinyl alcohol (LIQUIFILM TEARS) 1.4 % ophthalmic solution 1 drop  1 drop Both Eyes PRN [name], MD   1 drop at 04/23/21 1259   simethicone (MYLICON) chewable tablet 80 mg  80 mg Oral QID [name], MD   80 mg at 04/24/21 0800   traMADol (ULTRAM) tablet 50 mg  50 mg Oral QHS [name], MD   50 mg at  04/23/21 2156     Discharge Medications: Please see discharge summary for a list of discharge medications.  Relevant Imaging Results:  Relevant Lab Results:   Additional Information SS# [id number]  Su Hilt, RN

## 2021-04-24 NOTE — Progress Notes (Signed)
[facility] Daily Progress Note   04/22/21:             1.  Ultrasound guidance for vascular access right femoral artery             2.  Catheter placement into left common femoral artery from right femoral             3.  Aortogram and selective left lower extremity angiogram             4.  StarClose closure device right femoral artery  04/23/21: Left below-the-knee amputation  Subjective: Patient sitting in bedside chair eating breakfast. Family at bedside. No acute issues overnight.   Objective: Vitals:   04/23/21 2126 04/24/21 0116 04/24/21 0432 04/24/21 0759  BP: 131/79 123/70 112/68 116/76  Pulse: 89 99 97 93  Resp: '16 18 16 16  '$ Temp: 97.8 F (36.6 C) 98.3 F (36.8 C) 97.8 F (36.6 C) 97.9 F (36.6 C)  TempSrc: Oral     SpO2: 100% 100% 97% 96%  Weight:      Height:        Intake/Output Summary (Last 24 hours) at 04/24/2021 1116 Last data filed at 04/24/2021 1000 Gross per 24 hour  Intake 3286.5 ml  Output 1275 ml  Net 2011.5 ml   Physical Exam: A&Ox3, NAD CV: RRR Pulmonary: CTA Bilaterally Abdomen: Soft, Nontender, Nondistended Vascular:  Left Lower Extremity: Thigh soft. Knee flexible at the joint. Dressing is clean, dry and intact.    Laboratory: CBC    Component Value Date/Time   WBC 5.0 04/24/2021 0613   HGB 8.6 (L) 04/24/2021 0613   HGB 15.2 04/17/2014 1026   HCT 26.6 (L) 04/24/2021 0613   HCT 44.6 04/17/2014 1026   PLT 236 04/24/2021 0613   PLT 127 (L) 04/17/2014 1026   BMET    Component Value Date/Time   NA 135 04/23/2021 0442   NA 136 04/17/2014 1026   K 4.0 04/23/2021 0442   K 5.1 04/17/2014 1026   CL 103 04/23/2021 0442   CL 103 04/17/2014 1026   CO2 24 04/23/2021 0442   CO2 28 04/17/2014 1026   GLUCOSE 207 (H) 04/23/2021 0442   GLUCOSE 124 (H) 04/17/2014 1026   BUN 19 04/23/2021 0442   BUN 28 (H) 04/17/2014 1026   CREATININE 0.98 04/23/2021 0442   CREATININE 1.30 04/17/2014 1026   CALCIUM 8.2 (L)  04/23/2021 0442   CALCIUM 8.6 04/17/2014 1026   GFRNONAA >60 04/23/2021 0442   GFRNONAA 59 (L) 04/17/2014 1026   GFRAA >60 06/20/2020 1001   GFRAA >60 04/17/2014 1026   Assessment/Planning: The patient is a 70 year old male with severe PAD to the LLE now s/p left below the knee amputation - POD#1  1) Hbg this AM (8.6) Essentially stable. OR dressing is clean and dry. AM CBC.  2) Will order STAT BMP. If creatinine is stable - will KVO. 3) PT recommending SNF for dispo. Agree as patient is at high risk for falling. Will also add fall precaution order. Patient also to work with OT.  4) Will consult social work for SNF placement vs home with services pending on patient decision.  5) Will also consult nutrition in an attempt to maximize patient healing.  Discussed with Dr. Ellis Parents Debarah Mccumbers PA-C 04/24/2021 11:16 AM

## 2021-04-24 NOTE — Progress Notes (Signed)
Inpatient Diabetes Program Recommendations  AACE/ADA: New Consensus Statement on Inpatient Glycemic Control (2015)  Target Ranges:  Prepandial:   less than 140 mg/dL      Peak postprandial:   less than 180 mg/dL (1-2 hours)      Critically ill patients:  140 - 180 mg/dL   Lab Results  Component Value Date   GLUCAP 263 (H) 04/24/2021   HGBA1C 8.3 (H) 04/20/2021    Review of Glycemic Control Results for ZEON, MIYAHIRA (MRN [id number]) as of 04/24/2021 13:18  Ref. Range 04/23/2021 21:35 04/24/2021 07:58 04/24/2021 11:44  Glucose-Capillary Latest Ref Range: 70 - 99 mg/dL 242 (H) 326 (H) 263 (H)  Diabetes history: DM2 Outpatient Diabetes medications: Glyburdie 5 mg BID Current orders for Inpatient glycemic control: Semglee 8 units daily, Novolog 0-15 units TID with meals, Novolog 0-5 units QHS Inpatient Diabetes Program Recommendations:    Note blood sugar up after surgery on 7/28.  Of note patient did receive Decadron 5 mg x1  Anticipated that blood sugars will improve today.  May consider slight increased of Semglee to 10 units daily.    Thanks,  Adah Perl, RN, BC-ADM Inpatient Diabetes Coordinator Pager [phone]  (8a-5p)

## 2021-04-24 NOTE — Progress Notes (Signed)
Initial Nutrition Assessment  DOCUMENTATION CODES:  Not applicable  INTERVENTION:  Continue current diet as ordered, encourage PO intake Ensure Max po BID, each supplement provides 150 kcal and 30 grams of protein.  Glucerna Shake po daily, each supplement provides 220 kcal and 10 grams of protein 1 packet Juven BID, each packet provides 95 calories, 2.5 grams of protein (collagen), and 9.8 grams of carbohydrate (3 grams sugar); also contains 7 grams of L-arginine and L-glutamine, 300 mg vitamin C, 15 mg vitamin E, 1.2 mcg vitamin B-12, 9.5 mg zinc, 200 mg calcium, and 1.5 g  Calcium Beta-hydroxy-Beta-methylbutyrate to support wound healing Vitamin C '500mg'$  daily  NUTRITION DIAGNOSIS:  Increased nutrient needs related to wound healing as evidenced by estimated needs.  GOAL:  Patient will meet greater than or equal to 90% of their needs  MONITOR:  PO intake, Supplement acceptance, Skin  REASON FOR ASSESSMENT:  Consult Wound healing, Assessment of nutrition requirement/status  ASSESSMENT:  70 y.o. male presented to ED with worsening wound to the left heel. Podiatrist recommended that he present to ED for evaluation. PMH relevant for COPD, DM, fatty liver, GERD, HLD, HTN, PVD.  Imaging in ED revealed osteomyelitis and vascular surgery and podiatry were consulted. Pt underwent LLE angiogram 7/27 but podiatry determined foot was not salvageable. Underwent left BKA 7/28.  Pt resting in bedside chair at the time of visit, visitor at bedside. Pt reports appetite has been great, cleaning his plate at his meals. Pt reports that at home   Average Meal Intake: 7/25-7/29: 100% intake x 3 recorded meals  Nutritionally Relevant Medications: Scheduled Meds:  insulin aspart  0-15 Units Subcutaneous TID WC   insulin aspart  0-5 Units Subcutaneous QHS   [START ON 04/25/2021] insulin glargine-yfgn  10 Units Subcutaneous Daily   simethicone  80 mg Oral QID   PRN Meds: diphenhydrAMINE,  ondansetron  Labs Reviewed: Na 133 SBG ranges from 109-'326mg'$ /dL over the last 24 hours HgbA1c 8.3% (7/25)  NUTRITION - FOCUSED PHYSICAL EXAM: Flowsheet Row Most Recent Value  Orbital Region No depletion  Upper Arm Region No depletion  Thoracic and Lumbar Region No depletion  Buccal Region No depletion  Temple Region Mild depletion  Clavicle Bone Region Mild depletion  Clavicle and Acromion Bone Region Mild depletion  Scapular Bone Region No depletion  Dorsal Hand No depletion  Patellar Region No depletion  Anterior Thigh Region No depletion  Posterior Calf Region No depletion  Edema (RD Assessment) Mild  Hair Reviewed  Eyes Reviewed  Mouth Reviewed  Skin Reviewed  Nails Reviewed   Diet Order:   Diet Order             Diet heart healthy/carb modified Room service appropriate? Yes; Fluid consistency: Thin  Diet effective now                   EDUCATION NEEDS:  Education needs have been addressed  Skin:  Skin Assessment: Skin Integrity Issues: Skin Integrity Issues:: Incisions Incisions: left BKA  Last BM:  7/26  Height:  Ht Readings from Last 1 Encounters:  04/20/21 '5\' 10"'$  (1.778 m)    Weight:  Wt Readings from Last 1 Encounters:  04/20/21 72.6 kg    Ideal Body Weight:  71 kg (adjusted by 5.9% for BKA)  BMI:  Body mass index is 22.97 kg/m. - will adjust when new weight is obtained, current is from prior to amputation  Estimated Nutritional Needs:  Kcal:  2000-2200 kcal/d Protein:  100-110 g/d  Fluid:  >2 L/d  Ranell Patrick, RD, LDN Clinical Dietitian Pager on [place]

## 2021-04-24 NOTE — Evaluation (Signed)
Physical Therapy Evaluation Patient Details Name: [name] MRN: [id number] DOB: 02/11/1951 Today's Date: 04/24/2021   History of Present Illness  [name] is a 70 year old male with medical history significant for DM, HTN, PAD s/p prior amputations digits left foot who presents to the emergency room a for evaluation of a nonhealing painful wound of the left heel which he said he noticed just a week prior.  S/P L BKA.   Clinical Impression  Patient received in bed, he is agreeable to PT session. Patient is mod independent with bed mobility, patient is able to transfer sit to stand with RW and min assist. Patient transferred to recliner via pivot and min assist. He will continue to benefit from skilled PT while here to improve functional independence and safety with mobility.     Follow Up Recommendations SNF    Equipment Recommendations  None recommended by PT    Recommendations for Other Services       Precautions / Restrictions Precautions Precautions: Fall Restrictions Weight Bearing Restrictions: Yes LLE Weight Bearing: Non weight bearing      Mobility  Bed Mobility Overal bed mobility: Modified Independent             General bed mobility comments: increased time, use of bed rail    Transfers Overall transfer level: Needs assistance Equipment used: Rolling walker (2 wheeled) Transfers: Sit to/from [facility] Sit to Stand: Min assist Stand pivot transfers: Min assist       General transfer comment: Cues and assist for balance needed.  Ambulation/Gait             General Gait Details: unable  Stairs            Wheelchair Mobility    Modified Rankin (Stroke Patients Only)       Balance Overall balance assessment: Needs assistance Sitting-balance support: Feet supported Sitting balance-Leahy Scale: Good     Standing balance support: Bilateral upper extremity supported;During functional activity Standing  balance-Leahy Scale: Poor Standing balance comment: reliant on RW and min assist for balance/safety                             Pertinent Vitals/Pain Pain Assessment: Faces Faces Pain Scale: Hurts whole lot Pain Location: L LE Pain Descriptors / Indicators: Aching;Discomfort;Sore;Grimacing;Guarding Pain Intervention(s): Limited activity within patient's tolerance;Monitored during session;Repositioned    Home Living Family/patient expects to be discharged to:: Private residence Living Arrangements: Alone Available Help at Discharge: Family;Available PRN/intermittently Type of Home: Mobile home Home Access: Ramped entrance     Home Layout: One level Home Equipment: [place] - 2 wheels;Walker - 4 wheels;Cane - single point;Cane - quad;Crutches;Wheelchair - power;Wheelchair - manual;Hand held shower head;Shower seat;Bedside commode Additional Comments: indep prior, reports no falls. has equipment at home if needed    Prior Function Level of Independence: Independent;Independent with assistive device(s)         Comments: Used walker some     Hand Dominance        Extremity/Trunk Assessment   Upper Extremity Assessment Upper Extremity Assessment: Overall WFL for tasks assessed    Lower Extremity Assessment Lower Extremity Assessment: LLE deficits/detail LLE: Unable to fully assess due to pain LLE Coordination: decreased gross motor    Cervical / Trunk Assessment Cervical / Trunk Assessment: Normal  Communication   Communication: No difficulties;Other (comment) (very talkative)  Cognition Arousal/Alertness: Awake/alert Behavior During Therapy: WFL for tasks assessed/performed  Overall Cognitive Status: Within Functional Limits for tasks assessed                                 General Comments: patient very talkative      General Comments      Exercises     Assessment/Plan    PT Assessment Patient needs continued PT services  PT  Problem List Decreased strength;Decreased mobility;Decreased safety awareness;Decreased activity tolerance;Decreased balance;Pain;Decreased skin integrity       PT Treatment Interventions DME instruction;Therapeutic exercise;Gait training;Balance training;Stair training;Functional mobility training;Therapeutic activities;Patient/family education    PT Goals (Current goals can be found in the Care Plan section)  Acute Rehab PT Goals Patient Stated Goal: to return home PT Goal Formulation: With patient Time For Goal Achievement: 05/07/21 Potential to Achieve Goals: Fair    Frequency 7X/week   Barriers to discharge Decreased caregiver support      Co-evaluation               AM-PAC PT "6 Clicks" Mobility  Outcome Measure Help needed turning from your back to your side while in a flat bed without using bedrails?: A Little Help needed moving from lying on your back to sitting on the side of a flat bed without using bedrails?: A Little Help needed moving to and from a bed to a chair (including a wheelchair)?: A Lot Help needed standing up from a chair using your arms (e.g., wheelchair or bedside chair)?: A Lot Help needed to walk in hospital room?: Total Help needed climbing 3-5 steps with a railing? : Total 6 Click Score: 12    End of Session Equipment Utilized During Treatment: Gait belt;Oxygen Activity Tolerance: Patient limited by pain Patient left: in chair;with chair alarm set;with call bell/phone within reach Nurse Communication: Mobility status PT Visit Diagnosis: Unsteadiness on feet (R26.81);Other abnormalities of gait and mobility (R26.89);Pain;Difficulty in walking, not elsewhere classified (R26.2) Pain - Right/Left: Left Pain - part of body: Leg    Time: 0915-0930 PT Time Calculation (min) (ACUTE ONLY): 15 min   Charges:   PT Evaluation $PT Eval Moderate Complexity: 1 Mod          Candee Hoon, PT, GCS 04/24/21,9:47 AM

## 2021-04-24 NOTE — Care Management Important Message (Signed)
Important Message  Patient Details  Name: [name] MRN: [id number] Date of Birth: 12/13/1950   Medicare Important Message Given:  Yes     Juliann Pulse A Rex Oesterle 04/24/2021, 4:07 PM

## 2021-04-24 NOTE — Progress Notes (Signed)
PROGRESS NOTE    [name]  [id number] DOB: 05-29-1951 DOA: 04/20/2021 PCP: [name], MD   Brief Narrative:  70 y.o. male with medical history significant for DM, HTN, PAD s/p prior amputations digits left foot who presents to the emergency room a for evaluation of a nonhealing painful wound of the left heel which he said he noticed just a week prior.  Has a history of osteomyelitis of the left foot in the past.  He denies fever and chills and denies shortness of breath or chest pain  Patient was admitted to the hospitalist service with podiatry and vascular surgery consulted.  Vascular surgery to take patient for angiography and revascularization attempt on 7/27.  Patient is hemodynamically stable.  Imaging survey significant for osteomyelitis of posterior calcaneus with associated bony destruction and diffuse soft tissue swelling consistent with cellulitis.  Remains on antibiotics.  Underwent angiography with vascular surgery on 7/27.  Patent vasculature.  Per podiatry evaluation left lower extremity is likely not salvageable.    Status post BKA on 7/28 with vascular surgery.  Postoperatively pain well controlled.  Assessment & Plan:   Principal Problem:   Diabetic foot infection ([place]) Active Problems:   PAD (peripheral artery disease) ([place])   Essential hypertension   Uncontrolled type 2 diabetes mellitus with hyperglycemia ([facility])   Acute osteomyelitis of left foot ([facility])   Anemia   Elevated lactic acid level   Hyponatremia  Acute osteomyelitis left foot Diabetic ulcer left foot Peripheral arterial disease Status post left BKA Patient complains of wound to left heel worsening over the past week History of osteomyelitis on affected extremity with history of palpitations Vascular surgery podiatry consulted from ED Status post angiography 7/27, patent vasculature Left foot unsalvageable Status post left BKA on 7/28 Plan: Start broad-spectrum antibiotics Resume  statin and dual antiplatelet therapy Therapy evaluations TOC consult for SNF placement  Essential hypertension PTA metoprolol  Type 2 diabetes mellitus uncontrolled with hyperglycemia Last hemoglobin A1c in May was 7.1 Plan: Lantus 10 units daily NovoLog 3 units 3 times daily with meals Moderate sliding scale Nightly coverage Carb modified diet Uptitrate insulin regimen as necessary Diabetes coordinator following  Anemia of chronic disease Unclear underlying disease Suspect PAD versus uncontrolled diabetes Hemoglobin low but stable No indication for transfusion Monitor hemoglobin  Chronic hyponatremia Likely euvolemic hyponatremia Continue to monitor   DVT prophylaxis: SQ Lovenox Code Status: Full Family Communication: Family member at bedside 7/29 Disposition Plan: Status is: Inpatient  Remains inpatient appropriate because:Inpatient level of care appropriate due to severity of illness  Dispo: The patient is from: Home              Anticipated d/c is to: Home              Patient currently is not medically stable to d/c.   Difficult to place patient No  Postop day #1 status post left BKA.  Therapy evaluations.  Pain control.  Placement.     Level of care: Med-Surg  Consultants:  Vascular surgery Podiatry  Procedures:  Angiography 7/27 Left BKA 7/28  Antimicrobials:    Subjective: Patient seen and examined.  Sitting up in chair.  Pain well controlled.  In good spirits.  Objective: Vitals:   04/24/21 0116 04/24/21 0432 04/24/21 0759 04/24/21 1145  BP: 123/70 112/68 116/76 92/60  Pulse: 99 97 93 75  Resp: '18 16 16 15  '$ Temp: 98.3 F (36.8 C) 97.8 F (36.6 C) 97.9 F (36.6 C) 98.1  F (36.7 C)  TempSrc:      SpO2: 100% 97% 96% 100%  Weight:      Height:        Intake/Output Summary (Last 24 hours) at 04/24/2021 1329 Last data filed at 04/24/2021 1000 Gross per 24 hour  Intake 3286.5 ml  Output 1275 ml  Net 2011.5 ml   Filed Weights    04/20/21 1407  Weight: 72.6 kg    Examination:  General exam: No apparent distress.  Pain well controlled Respiratory system: Clear to auscultation. Respiratory effort normal. Cardiovascular system: S1-S2, regular rate and rhythm, no murmurs, no pedal edema  gastrointestinal system: Soft, nontender, nondistended, normal bowel sounds Central nervous system: Alert and oriented. No focal neurological deficits. Extremities: Status post left BKA Skin: No rashes, lesions or ulcers Psychiatry: Judgement and insight appear normal. Mood & affect appropriate.     Data Reviewed: I have personally reviewed following labs and imaging studies  CBC: Recent Labs  Lab 04/20/21 1409 04/21/21 0625 04/23/21 0442 04/24/21 0613  WBC 7.5 6.7 3.8* 5.0  NEUTROABS 5.8  --   --   --   HGB 9.6* 8.4* 8.9* 8.6*  HCT 29.6* 26.6* 27.8* 26.6*  MCV 80.0 81.8 79.0* 80.6  PLT 232 219 209 [id number]   Basic Metabolic Panel: Recent Labs  Lab 04/20/21 1409 04/21/21 0625 04/22/21 0612 04/23/21 0442 04/24/21 1137  NA 130* 132* 133* 135 133*  K 4.9 4.1 4.4 4.0 5.1  CL 96* 98 98 103 106  CO2 '25 25 27 24 22  '$ GLUCOSE 343* 169* 253* 207* 293*  BUN '21 15 17 19 23  '$ CREATININE 1.04 0.90 0.87 0.98 0.99  CALCIUM 8.6* 8.5* 8.6* 8.2* 7.6*  MG  --   --   --  1.9  --    GFR: Estimated Creatinine Clearance: 74.4 mL/min (by C-G formula based on SCr of 0.99 mg/dL). Liver Function Tests: Recent Labs  Lab 04/20/21 1409  AST 17  ALT 14  ALKPHOS 76  BILITOT 0.6  PROT 7.1  ALBUMIN 2.7*   No results for input(s): LIPASE, AMYLASE in the last 168 hours. No results for input(s): AMMONIA in the last 168 hours. Coagulation Profile: Recent Labs  Lab 04/23/21 0442  INR 1.1   Cardiac Enzymes: No results for input(s): CKTOTAL, CKMB, CKMBINDEX, TROPONINI in the last 168 hours. BNP (last 3 results) No results for input(s): PROBNP in the last 8760 hours. HbA1C: No results for input(s): HGBA1C in the last 72  hours.  CBG: Recent Labs  Lab 04/23/21 1449 04/23/21 1706 04/23/21 2135 04/24/21 0758 04/24/21 1144  GLUCAP 109* 128* 242* 326* 263*   Lipid Profile: No results for input(s): CHOL, HDL, LDLCALC, TRIG, CHOLHDL, LDLDIRECT in the last 72 hours. Thyroid Function Tests: No results for input(s): TSH, T4TOTAL, FREET4, T3FREE, THYROIDAB in the last 72 hours. Anemia Panel: No results for input(s): VITAMINB12, FOLATE, FERRITIN, TIBC, IRON, RETICCTPCT in the last 72 hours. Sepsis Labs: Recent Labs  Lab 04/20/21 1409 04/20/21 2130  LATICACIDVEN 2.2* 1.6    Recent Results (from the past 240 hour(s))  Resp Panel by RT-PCR (Flu A&B, Covid) Nasopharyngeal Swab     Status: None   Collection Time: 04/20/21 10:38 PM   Specimen: Nasopharyngeal Swab; Nasopharyngeal(NP) swabs in vial transport medium  Result Value Ref Range Status   SARS Coronavirus 2 by RT PCR NEGATIVE NEGATIVE Final    Comment: (NOTE) SARS-CoV-2 target nucleic acids are NOT DETECTED.  The SARS-CoV-2 RNA is generally detectable in  upper respiratory specimens during the acute phase of infection. The lowest concentration of SARS-CoV-2 viral copies this assay can detect is 138 copies/mL. A negative result does not preclude SARS-Cov-2 infection and should not be used as the sole basis for treatment or other patient management decisions. A negative result may occur with  improper specimen collection/handling, submission of specimen other than nasopharyngeal swab, presence of viral mutation(s) within the areas targeted by this assay, and inadequate number of viral copies(<138 copies/mL). A negative result must be combined with clinical observations, patient history, and epidemiological information. The expected result is Negative.  Fact Sheet for Patients:  EntrepreneurPulse.com.au  Fact Sheet for Healthcare Providers:  IncredibleEmployment.be  This test is no t yet approved or cleared by  the [place] FDA and  has been authorized for detection and/or diagnosis of SARS-CoV-2 by FDA under an Emergency Use Authorization (EUA). This EUA will remain  in effect (meaning this test can be used) for the duration of the COVID-19 declaration under Section 564(b)(1) of the Act, 21 U.S.C.section 360bbb-3(b)(1), unless the authorization is terminated  or revoked sooner.       Influenza A by PCR NEGATIVE NEGATIVE Final   Influenza B by PCR NEGATIVE NEGATIVE Final    Comment: (NOTE) The Xpert Xpress SARS-CoV-2/FLU/RSV plus assay is intended as an aid in the diagnosis of influenza from Nasopharyngeal swab specimens and should not be used as a sole basis for treatment. Nasal washings and aspirates are unacceptable for Xpert Xpress SARS-CoV-2/FLU/RSV testing.  Fact Sheet for Patients: EntrepreneurPulse.com.au  Fact Sheet for Healthcare Providers: IncredibleEmployment.be  This test is not yet approved or cleared by the [place] FDA and has been authorized for detection and/or diagnosis of SARS-CoV-2 by FDA under an Emergency Use Authorization (EUA). This EUA will remain in effect (meaning this test can be used) for the duration of the COVID-19 declaration under Section 564(b)(1) of the Act, 21 U.S.C. section 360bbb-3(b)(1), unless the authorization is terminated or revoked.  Performed at [facility], [address] [place], [place] [address]          Radiology Studies: No results found.      Scheduled Meds:  aspirin EC  81 mg Oral Daily   enoxaparin (LOVENOX) injection  40 mg Subcutaneous Q24H   gabapentin  300 mg Oral TID   insulin aspart  0-15 Units Subcutaneous TID WC   insulin aspart  0-5 Units Subcutaneous QHS   [START ON 04/25/2021] insulin glargine-yfgn  10 Units Subcutaneous Daily   ketorolac  15 mg Intravenous Q6H   metoprolol succinate  50 mg Oral Daily   mometasone-formoterol  2 puff Inhalation  BID   simethicone  80 mg Oral QID   traMADol  50 mg Oral QHS   Continuous Infusions:  cefTRIAXone (ROCEPHIN)  IV 2 g (04/24/21 1101)   metronidazole 500 mg (04/24/21 1220)   vancomycin 750 mg (04/24/21 0802)     LOS: 4 days    Time spent: 25 minutes    Sidney Ace, MD [facility] Pager [phone]  If 7PM-7AM, please contact night-coverage 04/24/2021, 1:29 PM

## 2021-04-24 NOTE — Evaluation (Signed)
Occupational Therapy Evaluation Patient Details Name: [name] MRN: [id number] DOB: Jan 22, 1951 Today's Date: 04/24/2021    History of Present Illness [name] is a 70 year old male with medical history significant for DM, HTN, PAD s/p prior amputations digits left foot who presents to the emergency room a for evaluation of a nonhealing painful wound of the left heel which he said he noticed just a week prior.  S/P L BKA.   Clinical Impression   Pt seen for OT evaluation this date. Pt was independent in all ADL prior to surgery. Pt is eager to return to PLOF with less pain and improved safety and independence. Pt currently requires MIN-MOD A for LB ADL tasks involving lateral leans, set up for seated grooming EOB, and pain limited. Pt educated in desensitization strategies to support pain control, educaetd in positioning of residual limb and strengthening exercises to minimize risk of contracture. Pt verbalized understanding and would benefit from skilled OT services including additional instruction in dressing techniques with or without assistive devices for dressing and bathing skills to support recall and carryover prior to discharge and ultimately to maximize safety, independence, and minimize falls risk and caregiver burden. Recommending SNF to support return to baseline function prior to amputation.      Follow Up Recommendations  SNF    Equipment Recommendations  None recommended by OT    Recommendations for Other Services       Precautions / Restrictions Precautions Precautions: Fall Restrictions Weight Bearing Restrictions: Yes LLE Weight Bearing: Non weight bearing      Mobility Bed Mobility               General bed mobility comments: declined 2/2 residual limb pain    Transfers                 General transfer comment: declined 2/2 residual limb pain    Balance                                           ADL either  performed or assessed with clinical judgement   ADL Overall ADL's : Needs assistance/impaired                                       General ADL Comments: Pt currently requires MIN-MOD A for LB ADL tasks, supervsiion and set up for seated EOB grooming     Vision Baseline Vision/History: Wears glasses Wears Glasses: At all times Patient Visual Report: No change from baseline       Perception     Praxis      Pertinent Vitals/Pain Pain Assessment: 0-10 Pain Score: 10-Worst pain ever Pain Location: L LE Pain Descriptors / Indicators: Aching;Discomfort;Sore;Grimacing;Guarding Pain Intervention(s): Limited activity within patient's tolerance;Monitored during session;Repositioned;Premedicated before session     Hand Dominance     Extremity/Trunk Assessment Upper Extremity Assessment Upper Extremity Assessment: Overall WFL for tasks assessed   Lower Extremity Assessment Lower Extremity Assessment: LLE deficits/detail LLE: Unable to fully assess due to pain LLE Coordination: decreased gross motor       Communication Communication Communication: No difficulties;Other (comment) (very talkative)   Cognition Arousal/Alertness: Awake/alert Behavior During Therapy: WFL for tasks assessed/performed Overall Cognitive Status: Within Functional Limits for tasks assessed  General Comments       Exercises Other Exercises Other Exercises: Pt educated in desensitization strategies to support pain control, educaetd in positioning of residual limb and strengthening exercises to minimize risk of contracture   Shoulder Instructions      Home Living Family/patient expects to be discharged to:: Private residence Living Arrangements: Alone Available Help at Discharge: Family;Available PRN/intermittently Type of Home: Mobile home Home Access: Ramped entrance     Home Layout: One level     Bathroom Shower/Tub:  Teacher, early years/pre: Standard Bathroom Accessibility: Yes   Home Equipment: Walker - 2 wheels;Walker - 4 wheels;Cane - single point;Cane - quad;Crutches;Wheelchair - power;Wheelchair - manual;Hand held shower head;Shower seat;Bedside commode   Additional Comments: indep prior, reports no falls. has equipment at home if needed      Prior Functioning/Environment Level of Independence: Independent;Independent with assistive device(s)        Comments: Used walker some        OT Problem List: Decreased strength;Pain;Impaired balance (sitting and/or standing);Decreased knowledge of use of DME or AE;Decreased knowledge of precautions      OT Treatment/Interventions: Self-care/ADL training;Therapeutic exercise;Therapeutic activities;DME and/or AE instruction;Patient/family education;Balance training    OT Goals(Current goals can be found in the care plan section) Acute Rehab OT Goals Patient Stated Goal: to return home OT Goal Formulation: With patient Time For Goal Achievement: 05/08/21 Potential to Achieve Goals: Good ADL Goals Pt Will Perform Lower Body Dressing: with modified independence;sitting/lateral leans Pt Will Transfer to Toilet: with min guard assist;bedside commode (c LRAD, hop to) Pt Will Perform Toileting - Clothing Manipulation and hygiene: with modified independence;sitting/lateral leans Additional ADL Goal #1: Pt will demo independence with learned desensitization strategies for residual limb  OT Frequency: Min 1X/week   Barriers to D/C:            Co-evaluation              AM-PAC OT "6 Clicks" Daily Activity     Outcome Measure Help from another person eating meals?: None Help from another person taking care of personal grooming?: None Help from another person toileting, which includes using toliet, bedpan, or urinal?: A Lot Help from another person bathing (including washing, rinsing, drying)?: A Lot Help from another person to put on  and taking off regular upper body clothing?: None Help from another person to put on and taking off regular lower body clothing?: A Lot 6 Click Score: 18   End of Session    Activity Tolerance: Patient tolerated treatment well;Patient limited by pain Patient left: in bed;with call bell/phone within reach;with bed alarm set  OT Visit Diagnosis: Other abnormalities of gait and mobility (R26.89);Pain Pain - Right/Left: Left Pain - part of body: Leg                Time: [id number] OT Time Calculation (min): 14 min Charges:  OT General Charges $OT Visit: 1 Visit OT Evaluation $OT Eval Moderate Complexity: 1 Mod OT Treatments $Therapeutic Activity: 8-22 mins  Hanley Hays, MPH, MS, OTR/L ascom [phone] 04/24/21, 5:10 PM

## 2021-04-25 DIAGNOSIS — L089 Local infection of the skin and subcutaneous tissue, unspecified: Secondary | ICD-10-CM | POA: Diagnosis not present

## 2021-04-25 DIAGNOSIS — E11628 Type 2 diabetes mellitus with other skin complications: Secondary | ICD-10-CM | POA: Diagnosis not present

## 2021-04-25 LAB — BASIC METABOLIC PANEL WITH GFR
Anion gap: 4 — ABNORMAL LOW (ref 5–15)
BUN: 26 mg/dL — ABNORMAL HIGH (ref 8–23)
CO2: 25 mmol/L (ref 22–32)
Calcium: 8 mg/dL — ABNORMAL LOW (ref 8.9–10.3)
Chloride: 108 mmol/L (ref 98–111)
Creatinine, Ser: 0.89 mg/dL (ref 0.61–1.24)
GFR, Estimated: >60 mL/min (ref >60)
Glucose, Bld: 196 mg/dL — ABNORMAL HIGH (ref 70–99)
Potassium: 4.5 mmol/L (ref 3.5–5.1)
Sodium: 137 mmol/L (ref 135–145)

## 2021-04-25 LAB — HEMOGLOBIN AND HEMATOCRIT, BLOOD
HCT: 26.9 % — ABNORMAL LOW (ref 39.0–52.0)
Hemoglobin: 8.8 g/dL — ABNORMAL LOW (ref 13.0–17.0)

## 2021-04-25 LAB — GLUCOSE, CAPILLARY
Glucose-Capillary: 170 mg/dL — ABNORMAL HIGH (ref 70–99)
Glucose-Capillary: 152 mg/dL — ABNORMAL HIGH (ref 70–99)
Glucose-Capillary: 226 mg/dL — ABNORMAL HIGH (ref 70–99)
Glucose-Capillary: 315 mg/dL — ABNORMAL HIGH (ref 70–99)

## 2021-04-25 LAB — PREPARE RBC (CROSSMATCH)

## 2021-04-25 LAB — CBC
HCT: 23.1 % — ABNORMAL LOW (ref 39.0–52.0)
Hemoglobin: 7.3 g/dL — ABNORMAL LOW (ref 13.0–17.0)
MCH: 26.2 pg (ref 26.0–34.0)
MCHC: 31.6 g/dL (ref 30.0–36.0)
MCV: 82.8 fL (ref 80.0–100.0)
Platelets: 195 10*3/uL (ref 150–400)
RBC: 2.79 MIL/uL — ABNORMAL LOW (ref 4.22–5.81)
RDW: 15 % (ref 11.5–15.5)
WBC: 3.6 10*3/uL — ABNORMAL LOW (ref 4.0–10.5)
nRBC: 0 % (ref 0.0–0.2)

## 2021-04-25 LAB — MAGNESIUM: Magnesium: 2 mg/dL (ref 1.7–2.4)

## 2021-04-25 MED ORDER — SODIUM CHLORIDE 0.9% IV SOLUTION: Freq: Once | INTRAVENOUS | Status: AC

## 2021-04-25 NOTE — Progress Notes (Signed)
Physical Therapy Treatment Patient Details Name: [name] MRN: [id number] DOB: 09/05/1951 Today's Date: 04/25/2021    History of Present Illness 70 year old male with medical history significant for DM, HTN, PAD s/p prior amputations digits left foot who presents to the emergency room a for evaluation of a nonhealing painful wound of the left heel which he said he noticed just a week prior.  S/P L BKA.    PT Comments    Pt was seen for mobiliy to transfer bed to chair, and attempted first standing with walker.  Pt was unable to stand up, so PT directly assisted him.  Pt attempted to transfer alone, asking PT not to touch him.  Despite his insistence on this, PT had to maintain contact, and avoided a fall due to pt being very much unable to do this alone.  Talked with nursing about his unsafe attempts to move, and asked them to assist him back to bed when ready.  Follow up with him as goals of acute PT are planned.  Follow Up Recommendations  SNF     Equipment Recommendations  None recommended by PT    Recommendations for Other Services       Precautions / Restrictions Precautions Precautions: Fall Precaution Comments: impulsive Restrictions Weight Bearing Restrictions: Yes LLE Weight Bearing: Non weight bearing    Mobility  Bed Mobility Overal bed mobility: Needs Assistance Bed Mobility: Supine to Sit     Supine to sit: Min guard          Transfers Overall transfer level: Needs assistance Equipment used: Rolling walker (2 wheeled);1 person hand held assist Transfers: Sit to/from [facility] Sit to Stand: Min assist Stand pivot transfers: Min assist       General transfer comment: pt was impulsive and required assistance to not sit before turning to the chair on either direction  Ambulation/Gait             General Gait Details: unable   Stairs             Wheelchair Mobility    Modified Rankin (Stroke Patients Only)        Balance Overall balance assessment: Needs assistance Sitting-balance support: Feet supported Sitting balance-Leahy Scale: Good     Standing balance support: Bilateral upper extremity supported;During functional activity Standing balance-Leahy Scale: Poor                              Cognition Arousal/Alertness: Awake/alert Behavior During Therapy: Impulsive Overall Cognitive Status: Difficult to assess                                 General Comments: patient very talkative      Exercises      General Comments General comments (skin integrity, edema, etc.): pt required min assist to stand up but then became unsafe trying to get to chair.  Tried to sit down quickly and had to assist him despite his efforts to be able to move alone      Pertinent Vitals/Pain Pain Assessment: Faces Faces Pain Scale: Hurts even more Pain Location: L LE Pain Descriptors / Indicators: Grimacing;Guarding Pain Intervention(s): Limited activity within patient's tolerance;Monitored during session;Premedicated before session;Repositioned    Home Living                      Prior  Function            PT Goals (current goals can now be found in the care plan section) Acute Rehab PT Goals Patient Stated Goal: to return home    Frequency    7X/week      PT Plan Current plan remains appropriate    Co-evaluation              AM-PAC PT "6 Clicks" Mobility   Outcome Measure  Help needed turning from your back to your side while in a flat bed without using bedrails?: A Little Help needed moving from lying on your back to sitting on the side of a flat bed without using bedrails?: A Little Help needed moving to and from a bed to a chair (including a wheelchair)?: A Lot Help needed standing up from a chair using your arms (e.g., wheelchair or bedside chair)?: A Lot Help needed to walk in hospital room?: Total Help needed climbing 3-5 steps with a  railing? : Total 6 Click Score: 12    End of Session   Activity Tolerance: Other (comment) (pt is distracted by his idea to transfer without assist)   Nurse Communication: Mobility status;Precautions PT Visit Diagnosis: Unsteadiness on feet (R26.81);Other abnormalities of gait and mobility (R26.89);Pain;Difficulty in walking, not elsewhere classified (R26.2) Pain - Right/Left: Left Pain - part of body: Leg     Time: [id number] PT Time Calculation (min) (ACUTE ONLY): 27 min  Charges:  $Therapeutic Activity: 23-37 mins        Ramond Dial 04/25/2021, 9:52 PM  Mee Hives, PT MS Acute Rehab Dept. Number: [place] and [place]

## 2021-04-25 NOTE — Progress Notes (Signed)
PROGRESS NOTE    [name]  [id number] DOB: 15-Dec-1950 DOA: 04/20/2021 PCP: [name], MD   Brief Narrative:  70 y.o. male with medical history significant for DM, HTN, PAD s/p prior amputations digits left foot who presents to the emergency room a for evaluation of a nonhealing painful wound of the left heel which he said he noticed just a week prior.  Has a history of osteomyelitis of the left foot in the past.  He denies fever and chills and denies shortness of breath or chest pain  Patient was admitted to the hospitalist service with podiatry and vascular surgery consulted.  Vascular surgery to take patient for angiography and revascularization attempt on 7/27.  Patient is hemodynamically stable.  Imaging survey significant for osteomyelitis of posterior calcaneus with associated bony destruction and diffuse soft tissue swelling consistent with cellulitis.  Remains on antibiotics.  Underwent angiography with vascular surgery on 7/27.  Patent vasculature.  Per podiatry evaluation left lower extremity is likely not salvageable.    Status post BKA on 7/28 with vascular surgery.  Postoperatively pain well controlled.  Seen by vascular surgery on 7/30.  Cleared for discharge from their standpoint.  Antibiotics have been discontinued.  Plavix has been restarted 7/29.  Has had some postoperative anemia.  Transfusing 1 unit PRBC.  Assessment & Plan:   Principal Problem:   Diabetic foot infection ([place]) Active Problems:   PAD (peripheral artery disease) ([place])   Essential hypertension   Uncontrolled type 2 diabetes mellitus with hyperglycemia ([facility])   Acute osteomyelitis of left foot ([facility])   Anemia   Elevated lactic acid level   Hyponatremia  Acute osteomyelitis left foot Diabetic ulcer left foot Peripheral arterial disease Status post left BKA Patient complains of wound to left heel worsening over the past week History of osteomyelitis on affected extremity with history of  palpitations Vascular surgery podiatry consulted from ED Status post angiography 7/27, patent vasculature Left foot unsalvageable Status post left BKA on 7/28 Plan: No indication for further antibiotics Continue statin and dual antiplatelet therapy Therapy evaluations continued TOC consult for SNF placement  Postoperative blood loss anemia Hemoglobin dropped to 7.3 No evidence of hemodynamically significant bleed Plan: Transfuse 1 unit PRBC 7/30 Posttransfusion H&H If hemoglobin stable anticipate medical readiness for discharge on 7/31  Essential hypertension PTA metoprolol  Type 2 diabetes mellitus uncontrolled with hyperglycemia Last hemoglobin A1c in May was 7.1 Plan: Lantus 10 units daily NovoLog 3 units 3 times daily with meals Moderate sliding scale Nightly coverage Carb modified diet Uptitrate insulin regimen as necessary Diabetes coordinator following  Anemia of chronic disease Unclear underlying disease Suspect PAD versus uncontrolled diabetes Hemoglobin low but stable No indication for transfusion Monitor hemoglobin  Chronic hyponatremia Likely euvolemic hyponatremia Continue to monitor   DVT prophylaxis: SQ Lovenox Code Status: Full Family Communication: Family member at bedside 7/29 Disposition Plan: Status is: Inpatient  Remains inpatient appropriate because:Inpatient level of care appropriate due to severity of illness  Dispo: The patient is from: Home              Anticipated d/c is to: Home              Patient currently is not medically stable to d/c.   Difficult to place patient No Postop day 2 status post left BKA.  Continue therapy evaluations.  Transfuse 1 unit PRBC.  Anticipate medical readiness for discharge on 7/31.     Level of care: Med-Surg  Consultants:  Vascular surgery Podiatry  Procedures:  Angiography 7/27 Left BKA 7/28  Antimicrobials:    Subjective: Patient seen and examined.  Sitting up in chair.  Pain well  controlled.  In good spirits.  Objective: Vitals:   04/25/21 0835 04/25/21 0945 04/25/21 1010 04/25/21 1010  BP: (!) 124/59 1'17/72 93/74 93/74 '$  Pulse: 84 82 84 84  Resp: '16 18 17 17  '$ Temp: 98.5 F (36.9 C) 98.3 F (36.8 C) 97.9 F (36.6 C) 97.9 F (36.6 C)  TempSrc:      SpO2: 99% 93%  97%  Weight:      Height:        Intake/Output Summary (Last 24 hours) at 04/25/2021 1244 Last data filed at 04/25/2021 1027 Gross per 24 hour  Intake 570 ml  Output 1475 ml  Net -905 ml   Filed Weights   04/20/21 1407  Weight: 72.6 kg    Examination:  General exam: No acute distress.  In good spirits Respiratory system: Clear to auscultation. Respiratory effort normal. Cardiovascular system: S1-S2, regular rate and rhythm, no murmurs, no pedal edema  gastrointestinal system: Soft, nontender, nondistended, normal bowel sounds Central nervous system: Alert and oriented. No focal neurological deficits. Extremities: Status post left BKA.  Stump appears warm and viable.  Incision CDI Skin: No rashes, lesions or ulcers Psychiatry: Judgement and insight appear normal. Mood & affect appropriate.     Data Reviewed: I have personally reviewed following labs and imaging studies  CBC: Recent Labs  Lab 04/20/21 1409 04/21/21 0625 04/23/21 0442 04/24/21 0613 04/25/21 0439  WBC 7.5 6.7 3.8* 5.0 3.6*  NEUTROABS 5.8  --   --   --   --   HGB 9.6* 8.4* 8.9* 8.6* 7.3*  HCT 29.6* 26.6* 27.8* 26.6* 23.1*  MCV 80.0 81.8 79.0* 80.6 82.8  PLT 232 219 209 236 0000000   Basic Metabolic Panel: Recent Labs  Lab 04/21/21 0625 04/22/21 0612 04/23/21 0442 04/24/21 1137 04/25/21 0439  NA 132* 133* 135 133* 137  K 4.1 4.4 4.0 5.1 4.5  CL 98 98 103 106 108  CO2 '25 27 24 22 25  '$ GLUCOSE 169* 253* 207* 293* 196*  BUN '15 17 19 23 '$ 26*  CREATININE 0.90 0.87 0.98 0.99 0.89  CALCIUM 8.5* 8.6* 8.2* 7.6* 8.0*  MG  --   --  1.9  --  2.0   GFR: Estimated Creatinine Clearance: 82.7 mL/min (by C-G formula  based on SCr of 0.89 mg/dL). Liver Function Tests: Recent Labs  Lab 04/20/21 1409  AST 17  ALT 14  ALKPHOS 76  BILITOT 0.6  PROT 7.1  ALBUMIN 2.7*   No results for input(s): LIPASE, AMYLASE in the last 168 hours. No results for input(s): AMMONIA in the last 168 hours. Coagulation Profile: Recent Labs  Lab 04/23/21 0442  INR 1.1   Cardiac Enzymes: No results for input(s): CKTOTAL, CKMB, CKMBINDEX, TROPONINI in the last 168 hours. BNP (last 3 results) No results for input(s): PROBNP in the last 8760 hours. HbA1C: No results for input(s): HGBA1C in the last 72 hours.  CBG: Recent Labs  Lab 04/24/21 1144 04/24/21 1718 04/24/21 2153 04/25/21 0726 04/25/21 1132  GLUCAP 263* 207* 210* 170* 152*   Lipid Profile: No results for input(s): CHOL, HDL, LDLCALC, TRIG, CHOLHDL, LDLDIRECT in the last 72 hours. Thyroid Function Tests: No results for input(s): TSH, T4TOTAL, FREET4, T3FREE, THYROIDAB in the last 72 hours. Anemia Panel: No results for input(s): VITAMINB12, FOLATE, FERRITIN, TIBC, IRON, RETICCTPCT in the  last 72 hours. Sepsis Labs: Recent Labs  Lab 04/20/21 1409 04/20/21 2130  LATICACIDVEN 2.2* 1.6    Recent Results (from the past 240 hour(s))  Resp Panel by RT-PCR (Flu A&B, Covid) Nasopharyngeal Swab     Status: None   Collection Time: 04/20/21 10:38 PM   Specimen: Nasopharyngeal Swab; Nasopharyngeal(NP) swabs in vial transport medium  Result Value Ref Range Status   SARS Coronavirus 2 by RT PCR NEGATIVE NEGATIVE Final    Comment: (NOTE) SARS-CoV-2 target nucleic acids are NOT DETECTED.  The SARS-CoV-2 RNA is generally detectable in upper respiratory specimens during the acute phase of infection. The lowest concentration of SARS-CoV-2 viral copies this assay can detect is 138 copies/mL. A negative result does not preclude SARS-Cov-2 infection and should not be used as the sole basis for treatment or other patient management decisions. A negative result  may occur with  improper specimen collection/handling, submission of specimen other than nasopharyngeal swab, presence of viral mutation(s) within the areas targeted by this assay, and inadequate number of viral copies(<138 copies/mL). A negative result must be combined with clinical observations, patient history, and epidemiological information. The expected result is Negative.  Fact Sheet for Patients:  EntrepreneurPulse.com.au  Fact Sheet for Healthcare Providers:  IncredibleEmployment.be  This test is no t yet approved or cleared by the [place] FDA and  has been authorized for detection and/or diagnosis of SARS-CoV-2 by FDA under an Emergency Use Authorization (EUA). This EUA will remain  in effect (meaning this test can be used) for the duration of the COVID-19 declaration under Section 564(b)(1) of the Act, 21 U.S.C.section 360bbb-3(b)(1), unless the authorization is terminated  or revoked sooner.       Influenza A by PCR NEGATIVE NEGATIVE Final   Influenza B by PCR NEGATIVE NEGATIVE Final    Comment: (NOTE) The Xpert Xpress SARS-CoV-2/FLU/RSV plus assay is intended as an aid in the diagnosis of influenza from Nasopharyngeal swab specimens and should not be used as a sole basis for treatment. Nasal washings and aspirates are unacceptable for Xpert Xpress SARS-CoV-2/FLU/RSV testing.  Fact Sheet for Patients: EntrepreneurPulse.com.au  Fact Sheet for Healthcare Providers: IncredibleEmployment.be  This test is not yet approved or cleared by the [place] FDA and has been authorized for detection and/or diagnosis of SARS-CoV-2 by FDA under an Emergency Use Authorization (EUA). This EUA will remain in effect (meaning this test can be used) for the duration of the COVID-19 declaration under Section 564(b)(1) of the Act, 21 U.S.C. section 360bbb-3(b)(1), unless the authorization is terminated  or revoked.  Performed at [facility], [address], [place], [place] [address]          Radiology Studies: No results found.      Scheduled Meds:  vitamin C  500 mg Oral Daily   aspirin EC  81 mg Oral Daily   clopidogrel  75 mg Oral Daily   enoxaparin (LOVENOX) injection  40 mg Subcutaneous Q24H   feeding supplement (GLUCERNA SHAKE)  237 mL Oral Q24H   gabapentin  300 mg Oral TID   insulin aspart  0-15 Units Subcutaneous TID WC   insulin aspart  0-5 Units Subcutaneous QHS   insulin glargine-yfgn  10 Units Subcutaneous Daily   ketorolac  15 mg Intravenous Q6H   metoprolol succinate  50 mg Oral Daily   mometasone-formoterol  2 puff Inhalation BID   nutrition supplement (JUVEN)  1 packet Oral BID BM   Ensure Max Protein  11 oz Oral BID  simethicone  80 mg Oral QID   traMADol  50 mg Oral QHS   Continuous Infusions:     LOS: 5 days    Time spent: 25 minutes    Sidney Ace, MD [facility] Pager [phone]  If 7PM-7AM, please contact night-coverage 04/25/2021, 12:44 PM

## 2021-04-25 NOTE — Progress Notes (Signed)
2 Days Post-Op   Subjective/Chief Complaint: Doing well. States he has occasional pain in stump. Controlled with current regimen   Objective: Vital signs in last 24 hours: Temp:  [97.8 F (36.6 C)-98.5 F (36.9 C)] 98.5 F (36.9 C) (07/30 0835) Pulse Rate:  [75-93] 84 (07/30 0835) Resp:  [15-20] 16 (07/30 0835) BP: (92-124)/(53-78) 124/59 (07/30 0835) SpO2:  [80 %-100 %] 99 % (07/30 0835) Last BM Date: 04/21/21  Intake/Output from previous day: 07/29 0701 - 07/30 0700 In: 2366.5 [P.O.:330; I.V.:1000; IV Piggyback:1036.5] Out: 1475 [Urine:1475] Intake/Output this shift: No intake/output data recorded.  General appearance: alert and no distress Extremities: LEFT BKA dressing changed- stump, warm , soft, viable , incision- C/D/I  Lab Results:  Recent Labs    04/24/21 0613 04/25/21 0439  WBC 5.0 3.6*  HGB 8.6* 7.3*  HCT 26.6* 23.1*  PLT 236 195   BMET Recent Labs    04/24/21 1137 04/25/21 0439  NA 133* 137  K 5.1 4.5  CL 106 108  CO2 22 25  GLUCOSE 293* 196*  BUN 23 26*  CREATININE 0.99 0.89  CALCIUM 7.6* 8.0*   PT/INR Recent Labs    04/23/21 0442  LABPROT 14.7  INR 1.1   ABG No results for input(s): PHART, HCO3 in the last 72 hours.  Invalid input(s): PCO2, PO2  Studies/Results: No results found.  Anti-infectives: Anti-infectives (From admission, onward)    Start     Dose/Rate Route Frequency Ordered Stop   04/24/21 0600  clindamycin (CLEOCIN) IVPB 900 mg        900 mg 100 mL/hr over 30 Minutes Intravenous On call to O.R. 04/23/21 1437 04/23/21 1545   04/23/21 2200  vancomycin (VANCOREADY) IVPB 750 mg/150 mL  Status:  Discontinued        750 mg 150 mL/hr over 60 Minutes Intravenous Every 12 hours 04/23/21 1051 04/24/21 1335   04/23/21 1439  clindamycin (CLEOCIN) 900 MG/50ML IVPB       Note to Pharmacy: [place] [facility], [place]: cabinet override      04/23/21 1439 04/23/21 1546   04/23/21 0000  clindamycin (CLEOCIN) IVPB 300 mg       Note to  Pharmacy: To be given in specials   300 mg 100 mL/hr over 30 Minutes Intravenous  Once 04/22/21 0940 04/22/21 1120   04/22/21 2000  vancomycin (VANCOCIN) IVPB 1000 mg/200 mL premix  Status:  Discontinued        1,000 mg 200 mL/hr over 60 Minutes Intravenous Every 12 hours 04/22/21 1254 04/23/21 1051   04/21/21 2200  vancomycin (VANCOREADY) IVPB 1500 mg/300 mL  Status:  Discontinued        1,500 mg 150 mL/hr over 120 Minutes Intravenous Every 24 hours 04/20/21 2322 04/22/21 1254   04/21/21 1100  metroNIDAZOLE (FLAGYL) IVPB 500 mg  Status:  Discontinued        500 mg 100 mL/hr over 60 Minutes Intravenous Every 8 hours 04/21/21 1034 04/24/21 1337   04/21/21 1045  cefTRIAXone (ROCEPHIN) 2 g in sodium chloride 0.9 % 100 mL IVPB  Status:  Discontinued        2 g 200 mL/hr over 30 Minutes Intravenous Every 24 hours 04/21/21 1034 04/24/21 1335   04/20/21 2315  vancomycin (VANCOREADY) IVPB 500 mg/100 mL        500 mg 100 mL/hr over 60 Minutes Intravenous  Once 04/20/21 2314 04/21/21 0104   04/20/21 2045  vancomycin (VANCOCIN) IVPB 1000 mg/200 mL premix  1,000 mg 200 mL/hr over 60 Minutes Intravenous  Once 04/20/21 2034 04/20/21 2234       Assessment/Plan: s/p Procedure(s): AMPUTATION BELOW KNEE (Left) POD #2 s/p LEFT BKA PT/OT OOB OK from Vascular standpoint to d/c to SNF or home with services Continue daily dressing changes. Follow up with Vascular Surgery in 2-3 weeks upon discharge.  LOS: 5 days    [name] A 04/25/2021

## 2021-04-25 NOTE — Plan of Care (Signed)

## 2021-04-26 DIAGNOSIS — E1165 Type 2 diabetes mellitus with hyperglycemia: Secondary | ICD-10-CM | POA: Diagnosis not present

## 2021-04-26 DIAGNOSIS — E11628 Type 2 diabetes mellitus with other skin complications: Secondary | ICD-10-CM | POA: Diagnosis not present

## 2021-04-26 DIAGNOSIS — I10 Essential (primary) hypertension: Secondary | ICD-10-CM | POA: Diagnosis not present

## 2021-04-26 DIAGNOSIS — L089 Local infection of the skin and subcutaneous tissue, unspecified: Secondary | ICD-10-CM | POA: Diagnosis not present

## 2021-04-26 DIAGNOSIS — K219 Gastro-esophageal reflux disease without esophagitis: Secondary | ICD-10-CM | POA: Diagnosis not present

## 2021-04-26 LAB — CBC
HCT: 28 % — ABNORMAL LOW (ref 39.0–52.0)
Hemoglobin: 9 g/dL — ABNORMAL LOW (ref 13.0–17.0)
MCH: 26.2 pg (ref 26.0–34.0)
MCHC: 32.1 g/dL (ref 30.0–36.0)
MCV: 81.4 fL (ref 80.0–100.0)
Platelets: 210 10*3/uL (ref 150–400)
RBC: 3.44 MIL/uL — ABNORMAL LOW (ref 4.22–5.81)
RDW: 15.3 % (ref 11.5–15.5)
WBC: 4.6 10*3/uL (ref 4.0–10.5)
nRBC: 0 % (ref 0.0–0.2)

## 2021-04-26 LAB — GLUCOSE, CAPILLARY
Glucose-Capillary: 175 mg/dL — ABNORMAL HIGH (ref 70–99)
Glucose-Capillary: 229 mg/dL — ABNORMAL HIGH (ref 70–99)
Glucose-Capillary: 209 mg/dL — ABNORMAL HIGH (ref 70–99)
Glucose-Capillary: 295 mg/dL — ABNORMAL HIGH (ref 70–99)

## 2021-04-26 LAB — TYPE AND SCREEN
ABO/RH(D): A POS
Antibody Screen: NEGATIVE
Unit division: 0
Unit division: 0

## 2021-04-26 LAB — BPAM RBC
Blood Product Expiration Date: 202208182359
Blood Product Expiration Date: 202208182359
ISSUE DATE / TIME: 202207281816
ISSUE DATE / TIME: 202207300950
Unit Type and Rh: 6200
Unit Type and Rh: 6200

## 2021-04-26 LAB — BASIC METABOLIC PANEL
Anion gap: 7 (ref 5–15)
BUN: 36 mg/dL — ABNORMAL HIGH (ref 8–23)
CO2: 25 mmol/L (ref 22–32)
Calcium: 8.4 mg/dL — ABNORMAL LOW (ref 8.9–10.3)
Chloride: 103 mmol/L (ref 98–111)
Creatinine, Ser: 0.84 mg/dL (ref 0.61–1.24)
GFR, Estimated: >60 mL/min (ref >60)
Glucose, Bld: 194 mg/dL — ABNORMAL HIGH (ref 70–99)
Potassium: 4.8 mmol/L (ref 3.5–5.1)
Sodium: 135 mmol/L (ref 135–145)

## 2021-04-26 LAB — RESP PANEL BY RT-PCR (FLU A&B, COVID) ARPGX2
Influenza A by PCR: NEGATIVE
Influenza B by PCR: NEGATIVE
SARS Coronavirus 2 by RT PCR: NEGATIVE

## 2021-04-26 LAB — MAGNESIUM: Magnesium: 1.9 mg/dL (ref 1.7–2.4)

## 2021-04-26 NOTE — Progress Notes (Signed)
PROGRESS NOTE    [name]  [id number] DOB: 1951/09/16 DOA: 04/20/2021 PCP: [name], MD   Brief Narrative:  70 y.o. male with medical history significant for DM, HTN, PAD s/p prior amputations digits left foot who presents to the emergency room a for evaluation of a nonhealing painful wound of the left heel which he said he noticed just a week prior.  Has a history of osteomyelitis of the left foot in the past.  He denies fever and chills and denies shortness of breath or chest pain  Patient was admitted to the hospitalist service with podiatry and vascular surgery consulted.  Vascular surgery to take patient for angiography and revascularization attempt on 7/27.  Patient is hemodynamically stable.  Imaging survey significant for osteomyelitis of posterior calcaneus with associated bony destruction and diffuse soft tissue swelling consistent with cellulitis.  Remains on antibiotics.  Underwent angiography with vascular surgery on 7/27.  Patent vasculature.  Per podiatry evaluation left lower extremity is likely not salvageable.    Status post BKA on 7/28 with vascular surgery.  Postoperatively pain well controlled.  Seen by vascular surgery on 7/30.  Cleared for discharge from their standpoint.  Antibiotics have been discontinued.  Plavix has been restarted 7/29.  Has had some postoperative anemia.  Transfusing 1 unit PRBC.  Hemoglobin responded to transfusion appropriately.   Patient medically stable for discharge as of 7/31  Assessment & Plan:   Principal Problem:   Diabetic foot infection ([place]) Active Problems:   PAD (peripheral artery disease) ([place])   Essential hypertension   Uncontrolled type 2 diabetes mellitus with hyperglycemia ([place])   Acute osteomyelitis of left foot ([facility])   Anemia   Elevated lactic acid level   Hyponatremia  Acute osteomyelitis left foot Diabetic ulcer left foot Peripheral arterial disease Status post left BKA Patient complains of wound to  left heel worsening over the past week History of osteomyelitis on affected extremity with history of palpitations Vascular surgery podiatry consulted from ED Status post angiography 7/27, patent vasculature Left foot unsalvageable Status post left BKA on 7/28 Plan: No indication for further antibiotics Continue statin and dual antiplatelet therapy Therapy evaluations continued TOC consult for SNF placement  Postoperative blood loss anemia Hemoglobin dropped to 7.3 No evidence of hemodynamically significant bleed Transfused 1 unit on 7/30 Hemoglobin responded appropriately Plan: No further need for transfusion Suggest intermittent monitoring of hemoglobin and transfuse as necessary for hemoglobin less than 8  Essential hypertension PTA metoprolol  Type 2 diabetes mellitus uncontrolled with hyperglycemia Last hemoglobin A1c in May was 7.1 Plan: Lantus 10 units daily NovoLog 3 units 3 times daily with meals Moderate sliding scale Nightly coverage Carb modified diet Uptitrate insulin regimen as necessary Diabetes coordinator following  Anemia of chronic disease Unclear underlying disease Suspect PAD versus uncontrolled diabetes Hemoglobin low but stable No indication for transfusion Monitor hemoglobin  Chronic hyponatremia Likely euvolemic hyponatremia Continue to monitor   DVT prophylaxis: SQ Lovenox Code Status: Full Family Communication: Family member at bedside 7/29 Disposition Plan: Status is: Inpatient  Remains inpatient appropriate because: Unsafe discharge plan  Dispo: The patient is from: Home              Anticipated d/c is to: SNF              Patient currently is medically stable for discharge   Difficult to place patient No  Postop day 2 status post left BKA.  Medically stable for discharge at this time.  Agreed to bed offer.  Pending insurance authorization     Level of care: Med-Surg  Consultants:  Vascular  surgery Podiatry  Procedures:  Angiography 7/27 Left BKA 7/28  Antimicrobials:    Subjective: Patient seen and examined.  Sitting up in chair.  Pain well controlled.  In good spirits.  Objective: Vitals:   04/25/21 1530 04/25/21 1939 04/26/21 0516 04/26/21 0730  BP: 94/74 120/70 133/78 127/89  Pulse: 86 91 88 88  Resp: '16 20 20 18  '$ Temp: 98.1 F (36.7 C) 97.9 F (36.6 C) 97.9 F (36.6 C) 97.9 F (36.6 C)  TempSrc:      SpO2: 95% 100% 96% 97%  Weight:      Height:        Intake/Output Summary (Last 24 hours) at 04/26/2021 1337 Last data filed at 04/26/2021 1027 Gross per 24 hour  Intake 720 ml  Output 4250 ml  Net -3530 ml   Filed Weights   04/20/21 1407  Weight: 72.6 kg    Examination:  General exam: No acute distress.  In good spirits Respiratory system: Clear to auscultation. Respiratory effort normal. Cardiovascular system: S1-S2, regular rate and rhythm, no murmurs, no pedal edema  gastrointestinal system: Soft, nontender, nondistended, normal bowel sounds Central nervous system: Alert and oriented. No focal neurological deficits. Extremities: Status post left BKA.  Stump appears warm and viable.  Incision CDI Skin: No rashes, lesions or ulcers Psychiatry: Judgement and insight appear normal. Mood & affect appropriate.     Data Reviewed: I have personally reviewed following labs and imaging studies  CBC: Recent Labs  Lab 04/20/21 1409 04/21/21 0625 04/23/21 0442 04/24/21 0613 04/25/21 0439 04/25/21 1514 04/26/21 0559  WBC 7.5 6.7 3.8* 5.0 3.6*  --  4.6  NEUTROABS 5.8  --   --   --   --   --   --   HGB 9.6* 8.4* 8.9* 8.6* 7.3* 8.8* 9.0*  HCT 29.6* 26.6* 27.8* 26.6* 23.1* 26.9* 28.0*  MCV 80.0 81.8 79.0* 80.6 82.8  --  81.4  PLT 232 219 209 236 195  --  [id number]   Basic Metabolic Panel: Recent Labs  Lab 04/22/21 0612 04/23/21 0442 04/24/21 1137 04/25/21 0439 04/26/21 0559  NA 133* 135 133* 137 135  K 4.4 4.0 5.1 4.5 4.8  CL 98 103 106 108  103  CO2 '27 24 22 25 25  '$ GLUCOSE 253* 207* 293* 196* 194*  BUN '17 19 23 '$ 26* 36*  CREATININE 0.87 0.98 0.99 0.89 0.84  CALCIUM 8.6* 8.2* 7.6* 8.0* 8.4*  MG  --  1.9  --  2.0 1.9   GFR: Estimated Creatinine Clearance: 87.6 mL/min (by C-G formula based on SCr of 0.84 mg/dL). Liver Function Tests: Recent Labs  Lab 04/20/21 1409  AST 17  ALT 14  ALKPHOS 76  BILITOT 0.6  PROT 7.1  ALBUMIN 2.7*   No results for input(s): LIPASE, AMYLASE in the last 168 hours. No results for input(s): AMMONIA in the last 168 hours. Coagulation Profile: Recent Labs  Lab 04/23/21 0442  INR 1.1   Cardiac Enzymes: No results for input(s): CKTOTAL, CKMB, CKMBINDEX, TROPONINI in the last 168 hours. BNP (last 3 results) No results for input(s): PROBNP in the last 8760 hours. HbA1C: No results for input(s): HGBA1C in the last 72 hours.  CBG: Recent Labs  Lab 04/25/21 1132 04/25/21 1623 04/25/21 2106 04/26/21 0731 04/26/21 1126  GLUCAP 152* 226* 315* 175* 229*   Lipid Profile: No results for input(s):  CHOL, HDL, LDLCALC, TRIG, CHOLHDL, LDLDIRECT in the last 72 hours. Thyroid Function Tests: No results for input(s): TSH, T4TOTAL, FREET4, T3FREE, THYROIDAB in the last 72 hours. Anemia Panel: No results for input(s): VITAMINB12, FOLATE, FERRITIN, TIBC, IRON, RETICCTPCT in the last 72 hours. Sepsis Labs: Recent Labs  Lab 04/20/21 1409 04/20/21 2130  LATICACIDVEN 2.2* 1.6    Recent Results (from the past 240 hour(s))  Resp Panel by RT-PCR (Flu A&B, Covid) Nasopharyngeal Swab     Status: None   Collection Time: 04/20/21 10:38 PM   Specimen: Nasopharyngeal Swab; Nasopharyngeal(NP) swabs in vial transport medium  Result Value Ref Range Status   SARS Coronavirus 2 by RT PCR NEGATIVE NEGATIVE Final    Comment: (NOTE) SARS-CoV-2 target nucleic acids are NOT DETECTED.  The SARS-CoV-2 RNA is generally detectable in upper respiratory specimens during the acute phase of infection. The  lowest concentration of SARS-CoV-2 viral copies this assay can detect is 138 copies/mL. A negative result does not preclude SARS-Cov-2 infection and should not be used as the sole basis for treatment or other patient management decisions. A negative result may occur with  improper specimen collection/handling, submission of specimen other than nasopharyngeal swab, presence of viral mutation(s) within the areas targeted by this assay, and inadequate number of viral copies(<138 copies/mL). A negative result must be combined with clinical observations, patient history, and epidemiological information. The expected result is Negative.  Fact Sheet for Patients:  EntrepreneurPulse.com.au  Fact Sheet for Healthcare Providers:  IncredibleEmployment.be  This test is no t yet approved or cleared by the [place] FDA and  has been authorized for detection and/or diagnosis of SARS-CoV-2 by FDA under an Emergency Use Authorization (EUA). This EUA will remain  in effect (meaning this test can be used) for the duration of the COVID-19 declaration under Section 564(b)(1) of the Act, 21 U.S.C.section 360bbb-3(b)(1), unless the authorization is terminated  or revoked sooner.       Influenza A by PCR NEGATIVE NEGATIVE Final   Influenza B by PCR NEGATIVE NEGATIVE Final    Comment: (NOTE) The Xpert Xpress SARS-CoV-2/FLU/RSV plus assay is intended as an aid in the diagnosis of influenza from Nasopharyngeal swab specimens and should not be used as a sole basis for treatment. Nasal washings and aspirates are unacceptable for Xpert Xpress SARS-CoV-2/FLU/RSV testing.  Fact Sheet for Patients: EntrepreneurPulse.com.au  Fact Sheet for Healthcare Providers: IncredibleEmployment.be  This test is not yet approved or cleared by the [place] FDA and has been authorized for detection and/or diagnosis of SARS-CoV-2 by FDA under  an Emergency Use Authorization (EUA). This EUA will remain in effect (meaning this test can be used) for the duration of the COVID-19 declaration under Section 564(b)(1) of the Act, 21 U.S.C. section 360bbb-3(b)(1), unless the authorization is terminated or revoked.  Performed at [facility], [address] [place], [place] [address]          Radiology Studies: No results found.      Scheduled Meds:  vitamin C  500 mg Oral Daily   aspirin EC  81 mg Oral Daily   clopidogrel  75 mg Oral Daily   enoxaparin (LOVENOX) injection  40 mg Subcutaneous Q24H   feeding supplement (GLUCERNA SHAKE)  237 mL Oral Q24H   gabapentin  300 mg Oral TID   insulin aspart  0-15 Units Subcutaneous TID WC   insulin aspart  0-5 Units Subcutaneous QHS   insulin glargine-yfgn  10 Units Subcutaneous Daily   ketorolac  15 mg Intravenous Q6H   metoprolol succinate  50 mg Oral Daily   mometasone-formoterol  2 puff Inhalation BID   nutrition supplement (JUVEN)  1 packet Oral BID BM   Ensure Max Protein  11 oz Oral BID   simethicone  80 mg Oral QID   traMADol  50 mg Oral QHS   Continuous Infusions:     LOS: 6 days    Time spent: 15 minutes    Sidney Ace, MD [facility] Pager [phone]  If 7PM-7AM, please contact night-coverage 04/26/2021, 1:37 PM

## 2021-04-26 NOTE — Progress Notes (Signed)
Physical Therapy Treatment Patient Details Name: [name] MRN: [id number] DOB: Aug 07, 1951 Today's Date: 04/26/2021    History of Present Illness 70 year old male with medical history significant for DM, HTN, PAD s/p prior amputations digits left foot who presents to the emergency room a for evaluation of a nonhealing painful wound of the left heel which he said he noticed just a week prior.  S/P L BKA.    PT Comments    Pt ready for session.  Pt with 2 pillows under thigh upon arrival.  Educated on proper positioning and voiced understanding.  Exercises in supine sitting and standing for TKR protocol.  He is able to stand with min a x 1 and RW.  Requires hands on +1 assist for general safety and he is able to take several small steps to recliner at bedside.  Fatigued with effort but remains in chair after session.   Follow Up Recommendations  SNF     Equipment Recommendations  None recommended by PT    Recommendations for Other Services       Precautions / Restrictions Precautions Precautions: Fall Precaution Comments: impulsive Restrictions Weight Bearing Restrictions: Yes LLE Weight Bearing: Non weight bearing    Mobility  Bed Mobility Overal bed mobility: Needs Assistance Bed Mobility: Supine to Sit     Supine to sit: Min guard          Transfers Overall transfer level: Needs assistance Equipment used: Rolling walker (2 wheeled) Transfers: Sit to/from [facility] Sit to Stand: Min assist Stand pivot transfers: Min assist          Ambulation/Gait Ambulation/Gait assistance: Min assist Gait Distance (Feet): 3 Feet Assistive device: Rolling walker (2 wheeled) Gait Pattern/deviations: Step-to pattern     General Gait Details: requres +1 assist for overall safety - generally unsteady.   Stairs             Wheelchair Mobility    Modified Rankin (Stroke Patients Only)       Balance Overall balance assessment: Needs  assistance   Sitting balance-Leahy Scale: Good     Standing balance support: Bilateral upper extremity supported;During functional activity Standing balance-Leahy Scale: Poor Standing balance comment: reliant on RW and min assist for balance/safety                            Cognition Arousal/Alertness: Awake/alert Behavior During Therapy: WFL for tasks assessed/performed Overall Cognitive Status: Within Functional Limits for tasks assessed                                        Exercises Other Exercises Other Exercises: LLE BKA amputation exercises and education in supine, sitting and standing    General Comments        Pertinent Vitals/Pain Pain Assessment: 0-10 Faces Pain Scale: Hurts a little bit Pain Location: L LE Pain Descriptors / Indicators: Grimacing;Guarding Pain Intervention(s): Limited activity within patient's tolerance;Monitored during session;Premedicated before session;Repositioned    Home Living                      Prior Function            PT Goals (current goals can now be found in the care plan section) Progress towards PT goals: Progressing toward goals    Frequency    7X/week  PT Plan Current plan remains appropriate    Co-evaluation              AM-PAC PT "6 Clicks" Mobility   Outcome Measure  Help needed turning from your back to your side while in a flat bed without using bedrails?: A Little Help needed moving from lying on your back to sitting on the side of a flat bed without using bedrails?: A Little Help needed moving to and from a bed to a chair (including a wheelchair)?: A Little Help needed standing up from a chair using your arms (e.g., wheelchair or bedside chair)?: A Little Help needed to walk in hospital room?: A Lot Help needed climbing 3-5 steps with a railing? : Total 6 Click Score: 15    End of Session Equipment Utilized During Treatment: Gait belt Activity  Tolerance: Patient tolerated treatment well Patient left: in chair;with chair alarm set;with call bell/phone within reach Nurse Communication: Mobility status;Precautions PT Visit Diagnosis: Unsteadiness on feet (R26.81);Other abnormalities of gait and mobility (R26.89);Pain;Difficulty in walking, not elsewhere classified (R26.2) Pain - Right/Left: Left Pain - part of body: Leg     Time: [id number] PT Time Calculation (min) (ACUTE ONLY): 15 min  Charges:  $Therapeutic Exercise: 8-22 mins                    Chesley Noon, PTA 04/26/21, 11:55 AM , 11:53 AM

## 2021-04-26 NOTE — TOC Progression Note (Signed)
Transition of Care [facility]) - Progression Note    Patient Details  Name: [name] MRN: [id number] Date of Birth: [birth year]  [facility] CM/SW Contact  Su Hilt, RN Phone Number: 04/26/2021, 9:56 AM  Clinical Narrative:    Reviewed bed offer with the patient, he is agreeable to accept the bed offer at [facility], I notified Marcell Barlow will start the regular [employer] auth, the patient is not managed by Royetta Car, He has had 2 covid vaccines and 1 booster        Expected Discharge Plan and Services                                                 Social Determinants of Health (SDOH) Interventions    Readmission Risk Interventions No flowsheet data found.

## 2021-04-26 NOTE — Plan of Care (Signed)

## 2021-04-27 DIAGNOSIS — E11628 Type 2 diabetes mellitus with other skin complications: Secondary | ICD-10-CM | POA: Diagnosis not present

## 2021-04-27 DIAGNOSIS — L089 Local infection of the skin and subcutaneous tissue, unspecified: Secondary | ICD-10-CM | POA: Diagnosis not present

## 2021-04-27 LAB — GLUCOSE, CAPILLARY
Glucose-Capillary: 156 mg/dL — ABNORMAL HIGH (ref 70–99)
Glucose-Capillary: 188 mg/dL — ABNORMAL HIGH (ref 70–99)
Glucose-Capillary: 192 mg/dL — ABNORMAL HIGH (ref 70–99)
Glucose-Capillary: 197 mg/dL — ABNORMAL HIGH (ref 70–99)

## 2021-04-27 LAB — SURGICAL PATHOLOGY

## 2021-04-27 MED ORDER — INSULIN ASPART 100 UNIT/ML IJ SOLN
5.0000 [IU] | Freq: Three times a day (TID) | INTRAMUSCULAR | Status: DC
  Administered 2021-04-27 – 2021-04-28 (×3): 5 [IU] via SUBCUTANEOUS
  Filled 2021-04-27 (×3): qty 1

## 2021-04-27 NOTE — Discharge Instructions (Signed)
Vascular Surgery Discharge Instructions:  1) You may shower. Gently clean your stump with soap and water. Gently pat dry.  2) Daily Dressing Changes: - ABD or Gauze to incision line - covered with Kerlix - covered with Ace

## 2021-04-27 NOTE — Plan of Care (Signed)

## 2021-04-27 NOTE — Progress Notes (Signed)
Physical Therapy Treatment Patient Details Name: [name] MRN: [id number] DOB: 20-Dec-1950 Today's Date: 04/27/2021    History of Present Illness 70 year old male with medical history significant for DM, HTN, PAD s/p prior amputations digits left foot who presents to the emergency room a for evaluation of a nonhealing painful wound of the left heel which he said he noticed just a week prior.  S/P L BKA.    PT Comments    Pt ready for session.  Participated in exercises as described below.  Time spent in standing for LLE ROM and hip stretching.  He is able to transfer to recliner at bedside and after short seated rest, is able to transition to standing for continued pre-gait activities.  Progressing well with confidence and activity tolerance.  He continues with some imbalance when transitioning hands from bed/chair <-> walker.     Follow Up Recommendations  SNF     Equipment Recommendations  None recommended by PT    Recommendations for Other Services       Precautions / Restrictions Precautions Precautions: Fall Precaution Comments: impulsive Restrictions Weight Bearing Restrictions: Yes LLE Weight Bearing: Non weight bearing    Mobility  Bed Mobility Overal bed mobility: Needs Assistance Bed Mobility: Supine to Sit     Supine to sit: Supervision          Transfers Overall transfer level: Needs assistance Equipment used: Rolling walker (2 wheeled) Transfers: Sit to/from Stand Sit to Stand: Min assist         General transfer comment: overall does well but has some imbalance transfering hands from bed to walker needing increased guarding  Ambulation/Gait Ambulation/Gait assistance: Min assist;Min guard Gait Distance (Feet): 3 Feet Assistive device: Rolling walker (2 wheeled)   Gait velocity: decreased   General Gait Details: requres +1 assist for overall safety - generally unsteady.   Stairs             Wheelchair Mobility    Modified  Rankin (Stroke Patients Only)       Balance Overall balance assessment: Needs assistance   Sitting balance-Leahy Scale: Good     Standing balance support: Bilateral upper extremity supported;During functional activity Standing balance-Leahy Scale: Poor Standing balance comment: reliant on RW and min assist for balance/safety                            Cognition Arousal/Alertness: Awake/alert Behavior During Therapy: WFL for tasks assessed/performed Overall Cognitive Status: Within Functional Limits for tasks assessed                                        Exercises      General Comments        Pertinent Vitals/Pain Pain Assessment: 0-10 Pain Score: 2  Pain Location: L LE Pain Descriptors / Indicators: Grimacing;Guarding Pain Intervention(s): Limited activity within patient's tolerance;Monitored during session;Premedicated before session;Repositioned    Home Living                      Prior Function            PT Goals (current goals can now be found in the care plan section) Progress towards PT goals: Progressing toward goals    Frequency    7X/week      PT Plan Current plan remains appropriate  Co-evaluation              AM-PAC PT "6 Clicks" Mobility   Outcome Measure  Help needed turning from your back to your side while in a flat bed without using bedrails?: A Little Help needed moving from lying on your back to sitting on the side of a flat bed without using bedrails?: A Little Help needed moving to and from a bed to a chair (including a wheelchair)?: A Little Help needed standing up from a chair using your arms (e.g., wheelchair or bedside chair)?: A Little Help needed to walk in hospital room?: A Little Help needed climbing 3-5 steps with a railing? : Total 6 Click Score: 16    End of Session Equipment Utilized During Treatment: Gait belt Activity Tolerance: Patient tolerated treatment  well Patient left: in chair;with chair alarm set;with call bell/phone within reach Nurse Communication: Mobility status;Precautions PT Visit Diagnosis: Unsteadiness on feet (R26.81);Other abnormalities of gait and mobility (R26.89);Pain;Difficulty in walking, not elsewhere classified (R26.2) Pain - Right/Left: Left Pain - part of body: Leg     Time: [id number] PT Time Calculation (min) (ACUTE ONLY): 24 min  Charges:  $Therapeutic Exercise: 8-22 mins $Therapeutic Activity: 8-22 mins                    Chesley Noon, PTA 04/27/21, 12:19 PM 04/27/2021, 12:16 PM

## 2021-04-27 NOTE — Progress Notes (Signed)
PROGRESS NOTE    [name]  [id number] DOB: November 16, 1950 DOA: 04/20/2021 PCP: [name], MD   Brief Narrative:  70 y.o. male with medical history significant for DM, HTN, PAD s/p prior amputations digits left foot who presents to the emergency room a for evaluation of a nonhealing painful wound of the left heel which he said he noticed just a week prior.  Has a history of osteomyelitis of the left foot in the past.  He denies fever and chills and denies shortness of breath or chest pain  Patient was admitted to the hospitalist service with podiatry and vascular surgery consulted.  Vascular surgery to take patient for angiography and revascularization attempt on 7/27.  Patient is hemodynamically stable.  Imaging survey significant for osteomyelitis of posterior calcaneus with associated bony destruction and diffuse soft tissue swelling consistent with cellulitis.  Remains on antibiotics.  Underwent angiography with vascular surgery on 7/27.  Patent vasculature.  Per podiatry evaluation left lower extremity is likely not salvageable.    Status post BKA on 7/28 with vascular surgery.  Postoperatively pain well controlled.  Seen by vascular surgery on 7/30.  Cleared for discharge from their standpoint.  Antibiotics have been discontinued.  Plavix has been restarted 7/29.  Has had some postoperative anemia.  Transfusing 1 unit PRBC.  Hemoglobin responded to transfusion appropriately.   Patient medically stable for discharge as of 7/31  Assessment & Plan:   Principal Problem:   Diabetic foot infection ([place]) Active Problems:   PAD (peripheral artery disease) ([place])   Essential hypertension   Uncontrolled type 2 diabetes mellitus with hyperglycemia ([place])   Acute osteomyelitis of left foot ([facility])   Anemia   Elevated lactic acid level   Hyponatremia  Acute osteomyelitis left foot Diabetic ulcer left foot Peripheral arterial disease Status post left BKA Patient complains of wound to  left heel worsening over the past week History of osteomyelitis on affected extremity with history of palpitations Vascular surgery podiatry consulted from ED Status post angiography 7/27, patent vasculature Left foot unsalvageable Status post left BKA on 7/28 Plan: No indication for antibiotics.  Dual antiplatelet therapy and statin to be continued.  TOC consult for SNF placement insurance authorization pending.  Postoperative blood loss anemia Hemoglobin dropped to 7.3 No evidence of hemodynamically significant bleed Transfused 1 unit on 7/30 Hemoglobin responded appropriately Plan: No further need for transfusion Suggest intermittent monitoring of hemoglobin and transfuse as necessary for hemoglobin less than 8  Essential hypertension PTA metoprolol  Type 2 diabetes mellitus uncontrolled with hyperglycemia Last hemoglobin A1c in May was 7.1 Plan: Lantus 10 units daily NovoLog 5 units 3 times daily with meals Moderate sliding scale Nightly coverage Carb modified diet Uptitrate insulin regimen as necessary Diabetes coordinator following  Anemia of chronic disease Unclear underlying disease Suspect PAD versus uncontrolled diabetes Hemoglobin low but stable No indication for transfusion Monitor hemoglobin  Chronic hyponatremia Likely euvolemic hyponatremia Continue to monitor   DVT prophylaxis: SQ Lovenox Code Status: Full Family Communication: Family member at bedside 7/29 Disposition Plan: Status is: Inpatient  Remains inpatient appropriate because: Unsafe discharge plan  Dispo: The patient is from: Home              Anticipated d/c is to: SNF              Patient currently is medically stable for discharge   Difficult to place patient No  Postop day 2 status post left BKA.  Medically stable for discharge  at this time.  Agreed to bed offer.  Pending insurance authorization     Level of care: Med-Surg  Consultants:  Vascular  surgery Podiatry  Procedures:  Angiography 7/27 Left BKA 7/28  Antimicrobials:    Subjective: Patient seen and examined.  Sitting up in chair.  Pain well controlled.  In good spirits.  Objective: Vitals:   04/26/21 0730 04/26/21 2001 04/27/21 0506 04/27/21 0725  BP: 127/89 121/71 120/78 139/72  Pulse: 88 89 83 78  Resp: '18 19 19 15  '$ Temp: 97.9 F (36.6 C) 97.9 F (36.6 C) 97.8 F (36.6 C) 98.1 F (36.7 C)  TempSrc:      SpO2: 97% 95% 96% 98%  Weight:      Height:        Intake/Output Summary (Last 24 hours) at 04/27/2021 1325 Last data filed at 04/27/2021 1024 Gross per 24 hour  Intake 480 ml  Output 2900 ml  Net -2420 ml   Filed Weights   04/20/21 1407  Weight: 72.6 kg    Examination:  General exam: No acute distress.  In good spirits Respiratory system: Clear to auscultation. Respiratory effort normal. Cardiovascular system: S1-S2, regular rate and rhythm, no murmurs, no pedal edema  gastrointestinal system: Soft, nontender, nondistended, normal bowel sounds Central nervous system: Alert and oriented. No focal neurological deficits. Extremities: Status post left BKA.  Stump appears warm and viable.  Incision CDI Skin: No rashes, lesions or ulcers Psychiatry: Judgement and insight appear normal. Mood & affect appropriate.     Data Reviewed: I have personally reviewed following labs and imaging studies  CBC: Recent Labs  Lab 04/20/21 1409 04/21/21 0625 04/23/21 0442 04/24/21 0613 04/25/21 0439 04/25/21 1514 04/26/21 0559  WBC 7.5 6.7 3.8* 5.0 3.6*  --  4.6  NEUTROABS 5.8  --   --   --   --   --   --   HGB 9.6* 8.4* 8.9* 8.6* 7.3* 8.8* 9.0*  HCT 29.6* 26.6* 27.8* 26.6* 23.1* 26.9* 28.0*  MCV 80.0 81.8 79.0* 80.6 82.8  --  81.4  PLT 232 219 209 236 195  --  [id number]   Basic Metabolic Panel: Recent Labs  Lab 04/22/21 0612 04/23/21 0442 04/24/21 1137 04/25/21 0439 04/26/21 0559  NA 133* 135 133* 137 135  K 4.4 4.0 5.1 4.5 4.8  CL 98 103 106 108  103  CO2 '27 24 22 25 25  '$ GLUCOSE 253* 207* 293* 196* 194*  BUN '17 19 23 '$ 26* 36*  CREATININE 0.87 0.98 0.99 0.89 0.84  CALCIUM 8.6* 8.2* 7.6* 8.0* 8.4*  MG  --  1.9  --  2.0 1.9   GFR: Estimated Creatinine Clearance: 87.6 mL/min (by C-G formula based on SCr of 0.84 mg/dL). Liver Function Tests: Recent Labs  Lab 04/20/21 1409  AST 17  ALT 14  ALKPHOS 76  BILITOT 0.6  PROT 7.1  ALBUMIN 2.7*   No results for input(s): LIPASE, AMYLASE in the last 168 hours. No results for input(s): AMMONIA in the last 168 hours. Coagulation Profile: Recent Labs  Lab 04/23/21 0442  INR 1.1   Cardiac Enzymes: No results for input(s): CKTOTAL, CKMB, CKMBINDEX, TROPONINI in the last 168 hours. BNP (last 3 results) No results for input(s): PROBNP in the last 8760 hours. HbA1C: No results for input(s): HGBA1C in the last 72 hours.  CBG: Recent Labs  Lab 04/26/21 1126 04/26/21 1626 04/26/21 2003 04/27/21 0733 04/27/21 1145  GLUCAP 229* 209* 295* 156* 188*   Lipid Profile:  No results for input(s): CHOL, HDL, LDLCALC, TRIG, CHOLHDL, LDLDIRECT in the last 72 hours. Thyroid Function Tests: No results for input(s): TSH, T4TOTAL, FREET4, T3FREE, THYROIDAB in the last 72 hours. Anemia Panel: No results for input(s): VITAMINB12, FOLATE, FERRITIN, TIBC, IRON, RETICCTPCT in the last 72 hours. Sepsis Labs: Recent Labs  Lab 04/20/21 1409 04/20/21 2130  LATICACIDVEN 2.2* 1.6    Recent Results (from the past 240 hour(s))  Resp Panel by RT-PCR (Flu A&B, Covid) Nasopharyngeal Swab     Status: None   Collection Time: 04/20/21 10:38 PM   Specimen: Nasopharyngeal Swab; Nasopharyngeal(NP) swabs in vial transport medium  Result Value Ref Range Status   SARS Coronavirus 2 by RT PCR NEGATIVE NEGATIVE Final    Comment: (NOTE) SARS-CoV-2 target nucleic acids are NOT DETECTED.  The SARS-CoV-2 RNA is generally detectable in upper respiratory specimens during the acute phase of infection. The  lowest concentration of SARS-CoV-2 viral copies this assay can detect is 138 copies/mL. A negative result does not preclude SARS-Cov-2 infection and should not be used as the sole basis for treatment or other patient management decisions. A negative result may occur with  improper specimen collection/handling, submission of specimen other than nasopharyngeal swab, presence of viral mutation(s) within the areas targeted by this assay, and inadequate number of viral copies(<138 copies/mL). A negative result must be combined with clinical observations, patient history, and epidemiological information. The expected result is Negative.  Fact Sheet for Patients:  EntrepreneurPulse.com.au  Fact Sheet for Healthcare Providers:  IncredibleEmployment.be  This test is no t yet approved or cleared by the [place] FDA and  has been authorized for detection and/or diagnosis of SARS-CoV-2 by FDA under an Emergency Use Authorization (EUA). This EUA will remain  in effect (meaning this test can be used) for the duration of the COVID-19 declaration under Section 564(b)(1) of the Act, 21 U.S.C.section 360bbb-3(b)(1), unless the authorization is terminated  or revoked sooner.       Influenza A by PCR NEGATIVE NEGATIVE Final   Influenza B by PCR NEGATIVE NEGATIVE Final    Comment: (NOTE) The Xpert Xpress SARS-CoV-2/FLU/RSV plus assay is intended as an aid in the diagnosis of influenza from Nasopharyngeal swab specimens and should not be used as a sole basis for treatment. Nasal washings and aspirates are unacceptable for Xpert Xpress SARS-CoV-2/FLU/RSV testing.  Fact Sheet for Patients: EntrepreneurPulse.com.au  Fact Sheet for Healthcare Providers: IncredibleEmployment.be  This test is not yet approved or cleared by the [place] FDA and has been authorized for detection and/or diagnosis of SARS-CoV-2 by FDA under  an Emergency Use Authorization (EUA). This EUA will remain in effect (meaning this test can be used) for the duration of the COVID-19 declaration under Section 564(b)(1) of the Act, 21 U.S.C. section 360bbb-3(b)(1), unless the authorization is terminated or revoked.  Performed at [facility], [place], [place] [address]   Resp Panel by RT-PCR (Flu A&B, Covid) Nasopharyngeal Swab     Status: None   Collection Time: 04/26/21  2:23 PM   Specimen: Nasopharyngeal Swab; Nasopharyngeal(NP) swabs in vial transport medium  Result Value Ref Range Status   SARS Coronavirus 2 by RT PCR NEGATIVE NEGATIVE Final    Comment: (NOTE) SARS-CoV-2 target nucleic acids are NOT DETECTED.  The SARS-CoV-2 RNA is generally detectable in upper respiratory specimens during the acute phase of infection. The lowest concentration of SARS-CoV-2 viral copies this assay can detect is 138 copies/mL. A negative result does not preclude SARS-Cov-2  infection and should not be used as the sole basis for treatment or other patient management decisions. A negative result may occur with  improper specimen collection/handling, submission of specimen other than nasopharyngeal swab, presence of viral mutation(s) within the areas targeted by this assay, and inadequate number of viral copies(<138 copies/mL). A negative result must be combined with clinical observations, patient history, and epidemiological information. The expected result is Negative.  Fact Sheet for Patients:  EntrepreneurPulse.com.au  Fact Sheet for Healthcare Providers:  IncredibleEmployment.be  This test is no t yet approved or cleared by the [place] FDA and  has been authorized for detection and/or diagnosis of SARS-CoV-2 by FDA under an Emergency Use Authorization (EUA). This EUA will remain  in effect (meaning this test can be used) for the duration of the COVID-19 declaration under  Section 564(b)(1) of the Act, 21 U.S.C.section 360bbb-3(b)(1), unless the authorization is terminated  or revoked sooner.       Influenza A by PCR NEGATIVE NEGATIVE Final   Influenza B by PCR NEGATIVE NEGATIVE Final    Comment: (NOTE) The Xpert Xpress SARS-CoV-2/FLU/RSV plus assay is intended as an aid in the diagnosis of influenza from Nasopharyngeal swab specimens and should not be used as a sole basis for treatment. Nasal washings and aspirates are unacceptable for Xpert Xpress SARS-CoV-2/FLU/RSV testing.  Fact Sheet for Patients: EntrepreneurPulse.com.au  Fact Sheet for Healthcare Providers: IncredibleEmployment.be  This test is not yet approved or cleared by the [place] FDA and has been authorized for detection and/or diagnosis of SARS-CoV-2 by FDA under an Emergency Use Authorization (EUA). This EUA will remain in effect (meaning this test can be used) for the duration of the COVID-19 declaration under Section 564(b)(1) of the Act, 21 U.S.C. section 360bbb-3(b)(1), unless the authorization is terminated or revoked.  Performed at [facility], [address], [place], [place] [address]          Radiology Studies: No results found.      Scheduled Meds:  vitamin C  500 mg Oral Daily   aspirin EC  81 mg Oral Daily   clopidogrel  75 mg Oral Daily   enoxaparin (LOVENOX) injection  40 mg Subcutaneous Q24H   feeding supplement (GLUCERNA SHAKE)  237 mL Oral Q24H   gabapentin  300 mg Oral TID   insulin aspart  0-15 Units Subcutaneous TID WC   insulin aspart  0-5 Units Subcutaneous QHS   insulin aspart  5 Units Subcutaneous TID WC   insulin glargine-yfgn  10 Units Subcutaneous Daily   ketorolac  15 mg Intravenous Q6H   metoprolol succinate  50 mg Oral Daily   mometasone-formoterol  2 puff Inhalation BID   nutrition supplement (JUVEN)  1 packet Oral BID BM   Ensure Max Protein  11 oz Oral BID   simethicone  80  mg Oral QID   traMADol  50 mg Oral QHS   Continuous Infusions:     LOS: 7 days    Time spent: 15 minutes    Sidney Ace, MD [facility] Pager [phone]  If 7PM-7AM, please contact night-coverage 04/27/2021, 1:25 PM

## 2021-04-27 NOTE — Progress Notes (Signed)
Inpatient Diabetes Program Recommendations  AACE/ADA: New Consensus Statement on Inpatient Glycemic Control (2015)  Target Ranges:  Prepandial:   less than 140 mg/dL      Peak postprandial:   less than 180 mg/dL (1-2 hours)      Critically ill patients:  140 - 180 mg/dL   Results for YANNIS, SWEETSER (MRN [id number]) as of 04/27/2021 10:00  Ref. Range 04/26/2021 07:31 04/26/2021 11:26 04/26/2021 16:26 04/26/2021 20:03  Glucose-Capillary Latest Ref Range: 70 - 99 mg/dL 175 (H)  3 units NOVOLOG  10 units Semglee  229 (H)  5 units NOVOLOG  209 (H)  5 units NOVOLOG  295 (H)  3 units NOVOLOG     Home DM Meds: Glyburide 5 mg BID  Current Orders: Semglee 10 units Daily      Novolog 0-15 units TID ac/hs      MD- Note patient eating 100% of meals per documentation and also getting PO supps.  Please consider adding Novolog Meal Coverage:   Novolog 5 units TID with meals  Hold if pt eats <50% of meal, Hold if pt NPO     --Will follow patient during hospitalization--  Wyn Quaker RN, MSN, CDE Diabetes Coordinator Inpatient Glycemic Control Team Team Pager: [phone] (8a-5p)

## 2021-04-28 DIAGNOSIS — I10 Essential (primary) hypertension: Secondary | ICD-10-CM | POA: Diagnosis not present

## 2021-04-28 DIAGNOSIS — J439 Emphysema, unspecified: Secondary | ICD-10-CM | POA: Diagnosis not present

## 2021-04-28 DIAGNOSIS — R5381 Other malaise: Secondary | ICD-10-CM | POA: Diagnosis not present

## 2021-04-28 DIAGNOSIS — E11621 Type 2 diabetes mellitus with foot ulcer: Secondary | ICD-10-CM | POA: Diagnosis not present

## 2021-04-28 DIAGNOSIS — L089 Local infection of the skin and subcutaneous tissue, unspecified: Secondary | ICD-10-CM | POA: Diagnosis not present

## 2021-04-28 DIAGNOSIS — Z89512 Acquired absence of left leg below knee: Secondary | ICD-10-CM | POA: Diagnosis not present

## 2021-04-28 DIAGNOSIS — D649 Anemia, unspecified: Secondary | ICD-10-CM | POA: Diagnosis not present

## 2021-04-28 DIAGNOSIS — E1169 Type 2 diabetes mellitus with other specified complication: Secondary | ICD-10-CM | POA: Diagnosis not present

## 2021-04-28 DIAGNOSIS — I471 Supraventricular tachycardia: Secondary | ICD-10-CM | POA: Diagnosis not present

## 2021-04-28 DIAGNOSIS — M86172 Other acute osteomyelitis, left ankle and foot: Secondary | ICD-10-CM | POA: Diagnosis not present

## 2021-04-28 DIAGNOSIS — Z4781 Encounter for orthopedic aftercare following surgical amputation: Secondary | ICD-10-CM | POA: Diagnosis not present

## 2021-04-28 DIAGNOSIS — L97528 Non-pressure chronic ulcer of other part of left foot with other specified severity: Secondary | ICD-10-CM | POA: Diagnosis not present

## 2021-04-28 DIAGNOSIS — J449 Chronic obstructive pulmonary disease, unspecified: Secondary | ICD-10-CM | POA: Diagnosis not present

## 2021-04-28 DIAGNOSIS — Z741 Need for assistance with personal care: Secondary | ICD-10-CM | POA: Diagnosis not present

## 2021-04-28 DIAGNOSIS — Z4889 Encounter for other specified surgical aftercare: Secondary | ICD-10-CM | POA: Diagnosis not present

## 2021-04-28 DIAGNOSIS — I739 Peripheral vascular disease, unspecified: Secondary | ICD-10-CM | POA: Diagnosis not present

## 2021-04-28 DIAGNOSIS — M6281 Muscle weakness (generalized): Secondary | ICD-10-CM | POA: Diagnosis not present

## 2021-04-28 DIAGNOSIS — I428 Other cardiomyopathies: Secondary | ICD-10-CM | POA: Diagnosis not present

## 2021-04-28 DIAGNOSIS — E11628 Type 2 diabetes mellitus with other skin complications: Secondary | ICD-10-CM | POA: Diagnosis not present

## 2021-04-28 DIAGNOSIS — R279 Unspecified lack of coordination: Secondary | ICD-10-CM | POA: Diagnosis not present

## 2021-04-28 DIAGNOSIS — E119 Type 2 diabetes mellitus without complications: Secondary | ICD-10-CM | POA: Diagnosis not present

## 2021-04-28 LAB — CREATININE, SERUM
Creatinine, Ser: 0.99 mg/dL (ref 0.61–1.24)
GFR, Estimated: >60 mL/min (ref >60)

## 2021-04-28 LAB — GLUCOSE, CAPILLARY
Glucose-Capillary: 188 mg/dL — ABNORMAL HIGH (ref 70–99)
Glucose-Capillary: 184 mg/dL — ABNORMAL HIGH (ref 70–99)

## 2021-04-28 MED ORDER — SIMETHICONE 80 MG PO CHEW: 80.0000 mg | CHEWABLE_TABLET | Freq: Four times a day (QID) | ORAL | 0 refills | Status: DC

## 2021-04-28 MED ORDER — COVID-19 MRNA VACC (MODERNA) 50 MCG/0.25ML IM SUSP
0.2500 mL | Freq: Once | INTRAMUSCULAR | Status: AC
  Administered 2021-04-28: 0.25 mL via INTRAMUSCULAR
  Filled 2021-04-28: qty 0.25

## 2021-04-28 MED ORDER — TRAMADOL HCL 50 MG PO TABS: 50.0000 mg | ORAL_TABLET | Freq: Two times a day (BID) | ORAL | 0 refills | Status: AC | PRN

## 2021-04-28 MED ORDER — HYDROCODONE-ACETAMINOPHEN 5-325 MG PO TABS: 1.0000 | ORAL_TABLET | ORAL | 0 refills | Status: AC | PRN

## 2021-04-28 NOTE — Progress Notes (Signed)
Called report to [facility] [place].  No unanswered questions. Both IV removed. Tip intact. Patient tolerated well.  All belongings with patient. Awaiting medic transport to facility. Two hard scripts copied and placed on chart.  Original placed in folder for facility.

## 2021-04-28 NOTE — Progress Notes (Signed)
Physical Therapy Treatment Patient Details Name: [name] MRN: [id number] DOB: [birth year] Today's Date: 04/28/2021    History of Present Illness 70 year old male with medical history significant for DM, HTN, PAD s/p prior amputations digits left foot who presents to the emergency room a for evaluation of a nonhealing painful wound of the left heel which he said he noticed just a week prior.  S/P L BKA.    PT Comments    Pt tolerated treatment well today, and was able to improve transfer/gait assist levels and standing balance in RW. Pt able to demonstrate independence with LE therex performed in yesterdays session. Single leg mini squat in RW added to interventions to improve safety/independence with transfers/gait. Emphasis noted on stretching hip flexors for improved transition to prosthesis. Despite progress, pt continues to be limited with meeting goals secondary to decreased standing balance, increased pain levels, decreased activity tolerance, and weakness. Pt will continue to benefit from skilled acute PT services to address deficits for return to baseline function. Will continue to recommend SNF at [place].     Follow Up Recommendations  SNF     Equipment Recommendations  None recommended by PT    Recommendations for Other Services       Precautions / Restrictions Precautions Precautions: Fall Precaution Comments: impulsive Restrictions Weight Bearing Restrictions: Yes LLE Weight Bearing: Non weight bearing    Mobility  Bed Mobility Overal bed mobility: Needs Assistance Bed Mobility: Supine to Sit     Supine to sit: Supervision     General bed mobility comments: Supervision for safety to sit EOB with use of BUE for support and HOB slightly elevated    Transfers Overall transfer level: Needs assistance Equipment used: Rolling walker (2 wheeled) Transfers: Sit to/from Stand Sit to Stand: Min guard         General transfer comment: CGA for safety to perform  multiple STS transfers from EOB and recliner with RW. Pt demonstrates good safety awareness with RLE/hand placement and sequencing. Requires increased time/effort to achieve full upright standing and for transition of UE from EOB>RW.  Ambulation/Gait Ambulation/Gait assistance: Min guard Gait Distance (Feet): 3 Feet Assistive device: Rolling walker (2 wheeled)   Gait velocity: decreased   General Gait Details: CGA for safety to ambulate 51f from EOB>recliner with RW, demonstrating hop-to gait pattern. Mild unsteadiness noted.     Balance Overall balance assessment: Needs assistance Sitting-balance support: Feet supported Sitting balance-Leahy Scale: Good     Standing balance support: Bilateral upper extremity supported;During functional activity Standing balance-Leahy Scale: Fair Standing balance comment: BUE support on RW               Cognition Arousal/Alertness: Awake/alert Behavior During Therapy: WFL for tasks assessed/performed Overall Cognitive Status: Within Functional Limits for tasks assessed                    General Comments: patient very talkative      Exercises Other Exercises Other Exercises: Pt able to perform bed mobility, transfers, and gait with grossly supervision-CGA for safety. Increased time/effort for mobility due to decreased balance and weakness. Other Exercises: Pt able to demonstrate x10 LLE therex including: supine SLR/hip ABD, standing hip flex/ext with emphasis on hip flexor stretch, and RLE SL mini squat in RW. Other Exercises: Pt educated regarding: PT role/POC, importance of stretching hip flexor for improved transition with prosthesis, participation with PT, independent performance of BLE therex. He verbalized understanding.    General Comments General  comments (skin integrity, edema, etc.): Residual limb wrapped in ACE bandage      Pertinent Vitals/Pain Pain Assessment: 0-10 Pain Score: 3  Pain Location: L LE Pain  Descriptors / Indicators: Grimacing;Guarding Pain Intervention(s): Limited activity within patient's tolerance;Monitored during session;Premedicated before session;Repositioned     PT Goals (current goals can now be found in the care plan section) Acute Rehab PT Goals Patient Stated Goal: to return home PT Goal Formulation: With patient Time For Goal Achievement: 05/07/21 Potential to Achieve Goals: Fair Progress towards PT goals: Progressing toward goals    Frequency    7X/week      PT Plan Current plan remains appropriate       AM-PAC PT "6 Clicks" Mobility   Outcome Measure  Help needed turning from your back to your side while in a flat bed without using bedrails?: A Little Help needed moving from lying on your back to sitting on the side of a flat bed without using bedrails?: A Little Help needed moving to and from a bed to a chair (including a wheelchair)?: A Little Help needed standing up from a chair using your arms (e.g., wheelchair or bedside chair)?: A Little Help needed to walk in hospital room?: A Little Help needed climbing 3-5 steps with a railing? : Total 6 Click Score: 16    End of Session Equipment Utilized During Treatment: Gait belt Activity Tolerance: Patient tolerated treatment well Patient left: in chair;with chair alarm set;with call bell/phone within reach Nurse Communication: Mobility status;Precautions PT Visit Diagnosis: Unsteadiness on feet (R26.81);Other abnormalities of gait and mobility (R26.89);Pain;Difficulty in walking, not elsewhere classified (R26.2) Pain - Right/Left: Left Pain - part of body: Leg     Time: [id number] PT Time Calculation (min) (ACUTE ONLY): 19 min  Charges:  $Therapeutic Exercise: 8-22 mins                     Herminio Commons, PT, DPT 10:08 AM,04/28/21

## 2021-04-28 NOTE — Care Management Important Message (Signed)
Important Message  Patient Details  Name: [name] MRN: [id number] Date of Birth: 30-Mar-1951   Medicare Important Message Given:  Yes     [name] 04/28/2021, 10:32 AM

## 2021-04-28 NOTE — Discharge Summary (Signed)
Physician Discharge Summary  [name] ION:[id number] DOB: 05-Dec-1950 DOA: 04/20/2021  PCP: [name], MD  Admit date: 04/20/2021 Discharge date: 04/28/2021  Admitted From: Home  Disposition: SNF  Recommendations for Outpatient Follow-up:  Follow up with PCP in 1-2 weeks Follow-up with vascular surgery for wound check  Home Health: No Equipment/Devices: None  Discharge Condition: Stable CODE STATUS: Full Diet recommendation: Heart Healthy  Brief/Interim Summary: 70 y.o. male with medical history significant for DM, HTN, PAD s/p prior amputations digits left foot who presents to the emergency room a for evaluation of a nonhealing painful wound of the left heel which he said he noticed just a week prior.  Has a history of osteomyelitis of the left foot in the past.  He denies fever and chills and denies shortness of breath or chest pain   Patient was admitted to the hospitalist service with podiatry and vascular surgery consulted.  Vascular surgery to take patient for angiography and revascularization attempt on 7/27.  Patient is hemodynamically stable.  Imaging survey significant for osteomyelitis of posterior calcaneus with associated bony destruction and diffuse soft tissue swelling consistent with cellulitis.   Remains on antibiotics.  Underwent angiography with vascular surgery on 7/27.  Patent vasculature.  Per podiatry evaluation left lower extremity is likely not salvageable.     Status post BKA on 7/28 with vascular surgery.  Postoperatively pain well controlled.  Seen by vascular surgery on 7/30.  Cleared for discharge from their standpoint.  Antibiotics have been discontinued.  Plavix has been restarted 7/29.  Has had some postoperative anemia.  Transfusing 1 unit PRBC.   Hemoglobin responded to transfusion appropriately.   Patient medically stable for discharge as of 7/31  Insurance authorization obtained and patient discharged to skilled nursing facility on  8/2   Discharge Diagnoses:  Principal Problem:   Diabetic foot infection ([place]) Active Problems:   PAD (peripheral artery disease) ([place])   Essential hypertension   Uncontrolled type 2 diabetes mellitus with hyperglycemia ([place])   Acute osteomyelitis of left foot ([facility])   Anemia   Elevated lactic acid level   Hyponatremia  Acute osteomyelitis left foot Diabetic ulcer left foot Peripheral arterial disease Status post left BKA Patient complains of wound to left heel worsening over the past week History of osteomyelitis on affected extremity with history of palpitations Vascular surgery podiatry consulted from ED Status post angiography 7/27, patent vasculature Left foot unsalvageable Status post left BKA on 7/28 Plan: Postoperative course uncomplicated.  Dual antiplatelet therapy resumed.  Infection source control achieved with BKA.  No antibiotics indicated at time of discharge.  Discharge to skilled nursing facility for rehab.  Follow-up outpatient PCP and vascular surgery   Postoperative blood loss anemia Hemoglobin dropped to 7.3 No evidence of hemodynamically significant bleed Transfused 1 unit on 7/30 Hemoglobin responded appropriately Plan: No further need for transfusion Suggest intermittent monitoring of hemoglobin and transfuse as necessary for hemoglobin less than 8   Essential hypertension PTA metoprolol   Type 2 diabetes mellitus uncontrolled with hyperglycemia Last hemoglobin A1c in May was 7.1 Plan: Can resume home regimen on time of discharge  Discharge Instructions  Discharge Instructions     Diet - low sodium heart healthy   Complete by: As directed    Increase activity slowly   Complete by: As directed    No wound care   Complete by: As directed       Allergies as of 04/28/2021       Reactions  Penicillins Rash, Other (See Comments)   Rash in and around the mouth Has patient had a PCN reaction causing immediate rash, facial/tongue/throat  swelling, SOB or lightheadedness with hypotension: Yes Has patient had a PCN reaction causing severe rash involving mucus membranes or skin necrosis: Yes Has patient had a PCN reaction that required hospitalization: No Has patient had a PCN reaction occurring within the last 10 years: Yes If all of the above answers are "NO", then may proceed with Cephalosporin use.   Bactrim [sulfamethoxazole-trimethoprim] Other (See Comments)   Mouth sores, Sores develop in mouth        Medication List     STOP taking these medications    B-D SINGLE USE SWABS REGULAR Pads   pantoprazole 40 MG tablet Commonly known as: PROTONIX       TAKE these medications    TRUEplus Lancets 30G Misc by Does not apply route. Use as directed twice a daily Dx E11.65   accu-chek soft touch lancets Use as instructed   acetaminophen 325 MG tablet Commonly known as: TYLENOL Take 650 mg by mouth every 6 (six) hours as needed for moderate pain.   aspirin EC 81 MG tablet Take 81 mg by mouth daily.   CALCIUM PLUS VITAMIN D PO Take 1 tablet by mouth daily.   clopidogrel 75 MG tablet Commonly known as: PLAVIX Take 1 tablet (75 mg total) by mouth daily.   cyclobenzaprine 10 MG tablet Commonly known as: FLEXERIL Take 1 tablet (10 mg total) by mouth 3 (three) times daily as needed for muscle spasms.   Dexcom G6 Receiver Devi Use as directed every 14 days to monitor blood sugars   Dexcom G6 Sensor Misc Use as directed every 14 days to monitor blood sugars   Dexcom G6 Transmitter Misc Use as directed every 14 days to monitor blood sugars   diphenhydrAMINE 25 mg capsule Commonly known as: BENADRYL Take 25 mg by mouth every 6 (six) hours as needed for allergies.   gabapentin 300 MG capsule Commonly known as: NEURONTIN Take 1 capsule (300 mg total) by mouth 3 (three) times daily.   glyBURIDE 5 MG tablet Commonly known as: DIABETA Take 1 tablet (5 mg total) by mouth 2 (two) times daily with a meal.    HYDROcodone-acetaminophen 5-325 MG tablet Commonly known as: NORCO/VICODIN Take 1-2 tablets by mouth every 4 (four) hours as needed for up to 3 days for moderate pain. For SNF use   lovastatin 10 MG tablet Commonly known as: MEVACOR Take 1 tab  Po QHS   meloxicam 15 MG tablet Commonly known as: MOBIC Take 15 mg by mouth daily.   metoprolol succinate 50 MG 24 hr tablet Commonly known as: TOPROL-XL Take 1 tablet (50 mg total) by mouth daily. Take with or immediately following a meal.   mometasone-formoterol 100-5 MCG/ACT Aero Commonly known as: DULERA Inhale 2 puffs into the lungs 2 (two) times daily as needed for wheezing.   simethicone 80 MG chewable tablet Commonly known as: MYLICON Chew 1 tablet (80 mg total) by mouth 4 (four) times daily.   traMADol 50 MG tablet Commonly known as: ULTRAM Take 1 tablet (50 mg total) by mouth 2 (two) times daily as needed for up to 3 days. TAKE 1 TABLET BY MOUTH EVERY 12 HOURS ASNEEDED FOR PAIN.  For SNF use What changed:  how much to take how to take this when to take this reasons to take this additional instructions   True Metrix Blood Glucose Test  test strip Generic drug: glucose blood Use as directed twice dailyDx E11.65   True Metrix Go Glucose Meter w/Device Kit Use as directed to check glucose DX e11.65   True Metrix Level 1 Low Soln Use as directed when needed dx E11.65        Contact information for follow-up providers     Kris Hartmann, NP Follow up in 2 week(s).   Specialty: Vascular Surgery Why: First post-op visit. Staple removal. No studies needed. Contact information: [place] [id number] [phone]              Contact information for after-discharge care     [facility] .   Service: Skilled Nursing Contact information: [place] [address] [phone]                    Allergies  Allergen  Reactions   Penicillins Rash and Other (See Comments)    Rash in and around the mouth Has patient had a PCN reaction causing immediate rash, facial/tongue/throat swelling, SOB or lightheadedness with hypotension: Yes Has patient had a PCN reaction causing severe rash involving mucus membranes or skin necrosis: Yes Has patient had a PCN reaction that required hospitalization: No Has patient had a PCN reaction occurring within the last 10 years: Yes If all of the above answers are "NO", then may proceed with Cephalosporin use.    Bactrim [Sulfamethoxazole-Trimethoprim] Other (See Comments)    Mouth sores, Sores develop in mouth    Consultations: Vascular surgery   Procedures/Studies: MR ANKLE LEFT WO CONTRAST  Result Date: 04/21/2021 CLINICAL DATA:  L calc osteo with wound, severe EXAM: MRI OF THE LEFT ANKLE WITHOUT CONTRAST TECHNIQUE: Multiplanar, multisequence MR imaging of the ankle was performed. No intravenous contrast was administered. COMPARISON:  Foot radiograph 04/20/2021 FINDINGS: TENDONS Peroneal: There is tenosynovitis of the peroneal longus and brevis tendons above and below the malleolus. There is an accessory peroneal tendon inserting on the lateral calcaneus consistent with a peroneus quartus. There is continuity distally through its attachment on the base of the fifth metatarsal. Posteromedial: Tenosynovitis of the posterior tibial tendon below the malleolus at the level of the talar head/neck. Flexor digitorum longus tendon intact. Flexor hallucis longus tendon intact. Anterior: Tibialis anterior tendon intact. Extensor hallucis longus tendon intact Extensor digitorum longus tendon intact. Achilles:  Distal Achilles tendinosis and full-thickness tearing. Plantar Fascia: Disruption of the proximal medial and lateral bundle plantar fascia attachment. LIGAMENTS Lateral: Anterior talofibular ligament intact. Calcaneofibular ligament intact. Posterior talofibular ligament intact.  Anterior and posterior tibiofibular ligaments intact. Medial: Chronic deep deltoid sprain.  Spring ligament intact. CARTILAGE Ankle Joint: No joint effusion. Normal ankle mortise. No chondral defect. Subtalar Joints/Sinus Tarsi: Normal subtalar joints. No subtalar joint effusion. Normal sinus tarsi. Bones: There is bone destruction of the posterior calcaneus with extensive bony edema and confluent low T1 signal extending throughout the calcaneal body, with a mildly preserved fat in the most anterior portion, subchondral in the posterior subtalar facet, and in the sustentaculum tali. There is patchy bone marrow edema within the navicular, cuboid, middle and lateral cuneiforms, likely stress reaction. Soft Tissue: Heel soft tissue ulcer noted. Generalized soft tissue swelling of the foot and ankle including the intrinsic musculature of the foot. The origin of the quadratus plantae and abductor hallucis tendons are disrupted (coronal T2 images 27, 25). IMPRESSION: Extensive osteomyelitis of the calcaneus, with bony destruction of the  posterior and plantar aspect. Disruption of the distal Achilles tendon and proximal medial/lateral bundle plantar fascia attachments. Disrupted origins of the quadratus plantae and abductor hallucis tendons. Adjacent soft tissue ulcer with soft tissue inflammation but no well-defined abscess. Tenosynovitis of the peroneal longus and brevis tendons above and below the malleolus. Anatomic variant peroneus quartus is present. Tenosynovitis of the posterior tibial tendon below the malleolus at the level of the talar head/neck. Patchy bone marrow edema within the navicular, cuboid, middle and lateral cuneiforms, likely stress reaction. Electronically Signed   By: Maurine Simmering   On: 04/21/2021 10:37   PERIPHERAL VASCULAR CATHETERIZATION  Result Date: 04/22/2021 See surgical note for result.  DG Foot Complete Left  Result Date: 04/20/2021 CLINICAL DATA:  Heel ulcer EXAM: LEFT FOOT -  COMPLETE 3+ VIEW COMPARISON:  11/11/2020 FINDINGS: There is diffuse soft tissue swelling of the foot with lucency along the heel suggestive of heel ulcer. There is new bony destruction of the posterior calcaneus. Vascular calcifications. Prior second and third toe amputations and fifth ray amputation to the level of the mid metatarsal. Severe hallux valgus with bunion formation. IMPRESSION: Osteomyelitis of the posterior calcaneus with significant bony destruction. Adjacent soft tissue ulcer. Diffuse soft tissue swelling. Electronically Signed   By: Maurine Simmering   On: 04/20/2021 21:26   (Echo, Carotid, EGD, Colonoscopy, ERCP)    Subjective: Seen and examined on the day of discharge.  In good spirits.  Working with physical therapy.  Stable for discharge to skilled nursing facility.  Discharge Exam: Vitals:   04/28/21 0803 04/28/21 0808  BP: 112/79 112/79  Pulse: 85 87  Resp:  17  Temp:  (!) 97.5 F (36.4 C)  SpO2:  94%   Vitals:   04/27/21 2330 04/28/21 0434 04/28/21 0803 04/28/21 0808  BP: 124/84 133/83 112/79 112/79  Pulse: 93 92 85 87  Resp: _0 Temp: 97.9 F (36.6 C) 97.9 F (36.6 C)  (!) 97.5 F (36.4 C)  TempSrc:      SpO2: 96% 97%  94%  Weight:      Height:        General: Pt is alert, awake, not in acute distress Cardiovascular: RRR, S1/S2 +, no rubs, no gallops Respiratory: CTA bilaterally, no wheezing, no rhonchi Abdominal: Soft, NT, ND, bowel sounds + Extremities: Status post left BKA    The results of significant diagnostics from this hospitalization (including imaging, microbiology, ancillary and laboratory) are listed below for reference.     Microbiology: Recent Results (from the past 240 hour(s))  Resp Panel by RT-PCR (Flu A&B, Covid) Nasopharyngeal Swab     Status: None   Collection Time: 04/20/21 10:38 PM   Specimen: Nasopharyngeal Swab; Nasopharyngeal(NP) swabs in vial transport medium  Result Value Ref Range Status   SARS Coronavirus 2 by RT  PCR NEGATIVE NEGATIVE Final    Comment: (NOTE) SARS-CoV-2 target nucleic acids are NOT DETECTED.  The SARS-CoV-2 RNA is generally detectable in upper respiratory specimens during the acute phase of infection. The lowest concentration of SARS-CoV-2 viral copies this assay can detect is 138 copies/mL. A negative result does not preclude SARS-Cov-2 infection and should not be used as the sole basis for treatment or other patient management decisions. A negative result may occur with  improper specimen collection/handling, submission of specimen other than nasopharyngeal swab, presence of viral mutation(s) within the areas targeted by this assay, and inadequate number of viral copies(<138 copies/mL). A negative result must be combined with  clinical observations, patient history, and epidemiological information. The expected result is Negative.  Fact Sheet for Patients:  EntrepreneurPulse.com.au  Fact Sheet for Healthcare Providers:  IncredibleEmployment.be  This test is no t yet approved or cleared by the [place] FDA and  has been authorized for detection and/or diagnosis of SARS-CoV-2 by FDA under an Emergency Use Authorization (EUA). This EUA will remain  in effect (meaning this test can be used) for the duration of the COVID-19 declaration under Section 564(b)(1) of the Act, 21 U.S.C.section 360bbb-3(b)(1), unless the authorization is terminated  or revoked sooner.       Influenza A by PCR NEGATIVE NEGATIVE Final   Influenza B by PCR NEGATIVE NEGATIVE Final    Comment: (NOTE) The Xpert Xpress SARS-CoV-2/FLU/RSV plus assay is intended as an aid in the diagnosis of influenza from Nasopharyngeal swab specimens and should not be used as a sole basis for treatment. Nasal washings and aspirates are unacceptable for Xpert Xpress SARS-CoV-2/FLU/RSV testing.  Fact Sheet for Patients: EntrepreneurPulse.com.au  Fact Sheet for  Healthcare Providers: IncredibleEmployment.be  This test is not yet approved or cleared by the [place] FDA and has been authorized for detection and/or diagnosis of SARS-CoV-2 by FDA under an Emergency Use Authorization (EUA). This EUA will remain in effect (meaning this test can be used) for the duration of the COVID-19 declaration under Section 564(b)(1) of the Act, 21 U.S.C. section 360bbb-3(b)(1), unless the authorization is terminated or revoked.  Performed at [facility], [place], [place] [address]   Resp Panel by RT-PCR (Flu A&B, Covid) Nasopharyngeal Swab     Status: None   Collection Time: 04/26/21  2:23 PM   Specimen: Nasopharyngeal Swab; Nasopharyngeal(NP) swabs in vial transport medium  Result Value Ref Range Status   SARS Coronavirus 2 by RT PCR NEGATIVE NEGATIVE Final    Comment: (NOTE) SARS-CoV-2 target nucleic acids are NOT DETECTED.  The SARS-CoV-2 RNA is generally detectable in upper respiratory specimens during the acute phase of infection. The lowest concentration of SARS-CoV-2 viral copies this assay can detect is 138 copies/mL. A negative result does not preclude SARS-Cov-2 infection and should not be used as the sole basis for treatment or other patient management decisions. A negative result may occur with  improper specimen collection/handling, submission of specimen other than nasopharyngeal swab, presence of viral mutation(s) within the areas targeted by this assay, and inadequate number of viral copies(<138 copies/mL). A negative result must be combined with clinical observations, patient history, and epidemiological information. The expected result is Negative.  Fact Sheet for Patients:  EntrepreneurPulse.com.au  Fact Sheet for Healthcare Providers:  IncredibleEmployment.be  This test is no t yet approved or cleared by the [place] FDA and  has been  authorized for detection and/or diagnosis of SARS-CoV-2 by FDA under an Emergency Use Authorization (EUA). This EUA will remain  in effect (meaning this test can be used) for the duration of the COVID-19 declaration under Section 564(b)(1) of the Act, 21 U.S.C.section 360bbb-3(b)(1), unless the authorization is terminated  or revoked sooner.       Influenza A by PCR NEGATIVE NEGATIVE Final   Influenza B by PCR NEGATIVE NEGATIVE Final    Comment: (NOTE) The Xpert Xpress SARS-CoV-2/FLU/RSV plus assay is intended as an aid in the diagnosis of influenza from Nasopharyngeal swab specimens and should not be used as a sole basis for treatment. Nasal washings and aspirates are unacceptable for Xpert Xpress SARS-CoV-2/FLU/RSV testing.  Fact Sheet for Patients: EntrepreneurPulse.com.au  Fact Sheet for Healthcare Providers: IncredibleEmployment.be  This test is not yet approved or cleared by the [place] FDA and has been authorized for detection and/or diagnosis of SARS-CoV-2 by FDA under an Emergency Use Authorization (EUA). This EUA will remain in effect (meaning this test can be used) for the duration of the COVID-19 declaration under Section 564(b)(1) of the Act, 21 U.S.C. section 360bbb-3(b)(1), unless the authorization is terminated or revoked.  Performed at [facility], [address] [place], [place] [address]      Labs: BNP (last 3 results) Recent Labs    11/12/20 0900 11/15/20 0419 11/18/20 0916  BNP 1,133.5* 542.4* 812.7*   Basic Metabolic Panel: Recent Labs  Lab 04/22/21 0612 04/23/21 0442 04/24/21 1137 04/25/21 0439 04/26/21 0559 04/28/21 0513  NA 133* 135 133* 137 135  --   K 4.4 4.0 5.1 4.5 4.8  --   CL 98 103 106 108 103  --   CO2 _0 --   GLUCOSE 253* 207* 293* 196* 194*  --   BUN _1 26* 36*  --   CREATININE 0.87 0.98 0.99 0.89 0.84 0.99  CALCIUM 8.6* 8.2* 7.6* 8.0* 8.4*  --   MG   --  1.9  --  2.0 1.9  --    Liver Function Tests: No results for input(s): AST, ALT, ALKPHOS, BILITOT, PROT, ALBUMIN in the last 168 hours. No results for input(s): LIPASE, AMYLASE in the last 168 hours. No results for input(s): AMMONIA in the last 168 hours. CBC: Recent Labs  Lab 04/23/21 0442 04/24/21 0613 04/25/21 0439 04/25/21 1514 04/26/21 0559  WBC 3.8* 5.0 3.6*  --  4.6  HGB 8.9* 8.6* 7.3* 8.8* 9.0*  HCT 27.8* 26.6* 23.1* 26.9* 28.0*  MCV 79.0* 80.6 82.8  --  81.4  PLT 209 236 195  --  210   Cardiac Enzymes: No results for input(s): CKTOTAL, CKMB, CKMBINDEX, TROPONINI in the last 168 hours. BNP: Invalid input(s): POCBNP CBG: Recent Labs  Lab 04/27/21 1145 04/27/21 1622 04/27/21 2005 04/28/21 0806 04/28/21 1141  GLUCAP 188* 192* 197* 188* 184*   D-Dimer No results for input(s): DDIMER in the last 72 hours. Hgb A1c No results for input(s): HGBA1C in the last 72 hours. Lipid Profile No results for input(s): CHOL, HDL, LDLCALC, TRIG, CHOLHDL, LDLDIRECT in the last 72 hours. Thyroid function studies No results for input(s): TSH, T4TOTAL, T3FREE, THYROIDAB in the last 72 hours.  Invalid input(s): FREET3 Anemia work up No results for input(s): VITAMINB12, FOLATE, FERRITIN, TIBC, IRON, RETICCTPCT in the last 72 hours. Urinalysis    Component Value Date/Time   COLORURINE YELLOW (A) 04/21/2021 0410   APPEARANCEUR HAZY (A) 04/21/2021 0410   APPEARANCEUR Clear 06/20/2020 0946   LABSPEC 1.018 04/21/2021 0410   PHURINE 6.0 04/21/2021 0410   GLUCOSEU >=500 (A) 04/21/2021 0410   HGBUR NEGATIVE 04/21/2021 0410   BILIRUBINUR NEGATIVE 04/21/2021 0410   BILIRUBINUR Negative 06/20/2020 0946   KETONESUR NEGATIVE 04/21/2021 0410   PROTEINUR NEGATIVE 04/21/2021 0410   NITRITE NEGATIVE 04/21/2021 0410   LEUKOCYTESUR NEGATIVE 04/21/2021 0410   Sepsis Labs Invalid input(s): PROCALCITONIN,  WBC,  LACTICIDVEN Microbiology Recent Results (from the past 240 hour(s))  Resp  Panel by RT-PCR (Flu A&B, Covid) Nasopharyngeal Swab     Status: None   Collection Time: 04/20/21 10:38 PM   Specimen: Nasopharyngeal Swab; Nasopharyngeal(NP) swabs in vial transport medium  Result Value Ref Range Status   SARS Coronavirus 2  by RT PCR NEGATIVE NEGATIVE Final    Comment: (NOTE) SARS-CoV-2 target nucleic acids are NOT DETECTED.  The SARS-CoV-2 RNA is generally detectable in upper respiratory specimens during the acute phase of infection. The lowest concentration of SARS-CoV-2 viral copies this assay can detect is 138 copies/mL. A negative result does not preclude SARS-Cov-2 infection and should not be used as the sole basis for treatment or other patient management decisions. A negative result may occur with  improper specimen collection/handling, submission of specimen other than nasopharyngeal swab, presence of viral mutation(s) within the areas targeted by this assay, and inadequate number of viral copies(<138 copies/mL). A negative result must be combined with clinical observations, patient history, and epidemiological information. The expected result is Negative.  Fact Sheet for Patients:  EntrepreneurPulse.com.au  Fact Sheet for Healthcare Providers:  IncredibleEmployment.be  This test is no t yet approved or cleared by the [place] FDA and  has been authorized for detection and/or diagnosis of SARS-CoV-2 by FDA under an Emergency Use Authorization (EUA). This EUA will remain  in effect (meaning this test can be used) for the duration of the COVID-19 declaration under Section 564(b)(1) of the Act, 21 U.S.C.section 360bbb-3(b)(1), unless the authorization is terminated  or revoked sooner.       Influenza A by PCR NEGATIVE NEGATIVE Final   Influenza B by PCR NEGATIVE NEGATIVE Final    Comment: (NOTE) The Xpert Xpress SARS-CoV-2/FLU/RSV plus assay is intended as an aid in the diagnosis of influenza from Nasopharyngeal  swab specimens and should not be used as a sole basis for treatment. Nasal washings and aspirates are unacceptable for Xpert Xpress SARS-CoV-2/FLU/RSV testing.  Fact Sheet for Patients: EntrepreneurPulse.com.au  Fact Sheet for Healthcare Providers: IncredibleEmployment.be  This test is not yet approved or cleared by the [place] FDA and has been authorized for detection and/or diagnosis of SARS-CoV-2 by FDA under an Emergency Use Authorization (EUA). This EUA will remain in effect (meaning this test can be used) for the duration of the COVID-19 declaration under Section 564(b)(1) of the Act, 21 U.S.C. section 360bbb-3(b)(1), unless the authorization is terminated or revoked.  Performed at [facility], [place], [place] [address]   Resp Panel by RT-PCR (Flu A&B, Covid) Nasopharyngeal Swab     Status: None   Collection Time: 04/26/21  2:23 PM   Specimen: Nasopharyngeal Swab; Nasopharyngeal(NP) swabs in vial transport medium  Result Value Ref Range Status   SARS Coronavirus 2 by RT PCR NEGATIVE NEGATIVE Final    Comment: (NOTE) SARS-CoV-2 target nucleic acids are NOT DETECTED.  The SARS-CoV-2 RNA is generally detectable in upper respiratory specimens during the acute phase of infection. The lowest concentration of SARS-CoV-2 viral copies this assay can detect is 138 copies/mL. A negative result does not preclude SARS-Cov-2 infection and should not be used as the sole basis for treatment or other patient management decisions. A negative result may occur with  improper specimen collection/handling, submission of specimen other than nasopharyngeal swab, presence of viral mutation(s) within the areas targeted by this assay, and inadequate number of viral copies(<138 copies/mL). A negative result must be combined with clinical observations, patient history, and epidemiological information. The expected result is  Negative.  Fact Sheet for Patients:  EntrepreneurPulse.com.au  Fact Sheet for Healthcare Providers:  IncredibleEmployment.be  This test is no t yet approved or cleared by the [place] FDA and  has been authorized for detection and/or diagnosis of SARS-CoV-2 by FDA under an Emergency Use  Authorization (EUA). This EUA will remain  in effect (meaning this test can be used) for the duration of the COVID-19 declaration under Section 564(b)(1) of the Act, 21 U.S.C.section 360bbb-3(b)(1), unless the authorization is terminated  or revoked sooner.       Influenza A by PCR NEGATIVE NEGATIVE Final   Influenza B by PCR NEGATIVE NEGATIVE Final    Comment: (NOTE) The Xpert Xpress SARS-CoV-2/FLU/RSV plus assay is intended as an aid in the diagnosis of influenza from Nasopharyngeal swab specimens and should not be used as a sole basis for treatment. Nasal washings and aspirates are unacceptable for Xpert Xpress SARS-CoV-2/FLU/RSV testing.  Fact Sheet for Patients: EntrepreneurPulse.com.au  Fact Sheet for Healthcare Providers: IncredibleEmployment.be  This test is not yet approved or cleared by the [place] FDA and has been authorized for detection and/or diagnosis of SARS-CoV-2 by FDA under an Emergency Use Authorization (EUA). This EUA will remain in effect (meaning this test can be used) for the duration of the COVID-19 declaration under Section 564(b)(1) of the Act, 21 U.S.C. section 360bbb-3(b)(1), unless the authorization is terminated or revoked.  Performed at [facility], [address] [place], [place] [address]      Time coordinating discharge: Over 30 minutes  SIGNED:   Sidney Ace, MD  [facility] 04/28/2021, 12:05 PM Pager   If 7PM-7AM, please contact night-coverage

## 2021-04-28 NOTE — Plan of Care (Signed)

## 2021-04-28 NOTE — TOC Progression Note (Signed)
Transition of Care [facility]) - Progression Note    Patient Details  Name: [name] MRN: [id number] Date of Birth: Feb 24, 1951  Transition of Care [facility]) CM/SW [place], RN Phone Number: 04/28/2021, 1:14 PM  Clinical Narrative:    Patient to go to [facility] today and being picked up at [address] [facility], Daughter [name] is aware       Expected Discharge Plan and Services           Expected Discharge Date: 04/28/21                                     Social Determinants of Health (SDOH) Interventions    Readmission Risk Interventions No flowsheet data found.

## 2021-04-29 ENCOUNTER — Other Ambulatory Visit: Payer: Self-pay

## 2021-04-29 NOTE — Patient Outreach (Signed)
[facility]) Care Management  04/29/2021  [name] 11/29/1950 [id number]   Patient with recent hospital discharge.  Patient discharged to SNF.    Plan: RN CM will close case.   [name], RN, MSN [place] Management Care Management Coordinator Direct Line [phone] Cell [phone] Toll Free: [phone]  Fax: [phone]

## 2021-05-01 DIAGNOSIS — L97528 Non-pressure chronic ulcer of other part of left foot with other specified severity: Secondary | ICD-10-CM | POA: Diagnosis not present

## 2021-05-01 DIAGNOSIS — Z4889 Encounter for other specified surgical aftercare: Secondary | ICD-10-CM | POA: Diagnosis not present

## 2021-05-01 DIAGNOSIS — E11621 Type 2 diabetes mellitus with foot ulcer: Secondary | ICD-10-CM | POA: Diagnosis not present

## 2021-05-04 DIAGNOSIS — J439 Emphysema, unspecified: Secondary | ICD-10-CM | POA: Diagnosis not present

## 2021-05-12 DIAGNOSIS — I428 Other cardiomyopathies: Secondary | ICD-10-CM | POA: Diagnosis not present

## 2021-05-12 DIAGNOSIS — I471 Supraventricular tachycardia: Secondary | ICD-10-CM | POA: Diagnosis not present

## 2021-05-12 DIAGNOSIS — I739 Peripheral vascular disease, unspecified: Secondary | ICD-10-CM | POA: Diagnosis not present

## 2021-05-12 DIAGNOSIS — I10 Essential (primary) hypertension: Secondary | ICD-10-CM | POA: Diagnosis not present

## 2021-05-13 ENCOUNTER — Telehealth: Payer: Self-pay

## 2021-05-13 NOTE — Telephone Encounter (Signed)
Left patient vm to return my call to schedule hospital f/u after he is discharged from skilled nursing-Toni

## 2021-05-14 ENCOUNTER — Telehealth: Payer: Self-pay | Admitting: Pharmacist

## 2021-05-14 NOTE — Progress Notes (Addendum)
Chronic Care Management Pharmacy Assistant   Name: [name]  MRN: [id number] DOB: [birth year]  Reason for Encounter: Disease State For HTN.    Conditions to be addressed/monitored: HTN, PAD, SVT, GERD, Type II DM, Lipids, Arthritis  Recent office visits:  None since 04/23/21  Recent consult visits:  None since 04/23/21  Hospital visits:  04/23/21 [facility] Huxley, MD. For left amputation below knee.  Medications: Outpatient Encounter Medications as of 05/14/2021  Medication Sig   acetaminophen (TYLENOL) 325 MG tablet Take 650 mg by mouth every 6 (six) hours as needed for moderate pain.   aspirin EC 81 MG tablet Take 81 mg by mouth daily.   Blood Glucose Calibration (TRUE METRIX LEVEL 1) Low SOLN Use as directed when needed dx E11.65   Blood Glucose Monitoring Suppl (TRUE METRIX GO GLUCOSE METER) w/Device KIT Use as directed to check glucose DX e11.65   Calcium Carbonate-Vitamin D (CALCIUM PLUS VITAMIN D PO) Take 1 tablet by mouth daily.   clopidogrel (PLAVIX) 75 MG tablet Take 1 tablet (75 mg total) by mouth daily.   Continuous Blood Gluc Receiver (DEXCOM G6 RECEIVER) DEVI Use as directed every 14 days to monitor blood sugars   Continuous Blood Gluc Sensor (DEXCOM G6 SENSOR) MISC Use as directed every 14 days to monitor blood sugars   Continuous Blood Gluc Transmit (DEXCOM G6 TRANSMITTER) MISC Use as directed every 14 days to monitor blood sugars   cyclobenzaprine (FLEXERIL) 10 MG tablet Take 1 tablet (10 mg total) by mouth 3 (three) times daily as needed for muscle spasms.   diphenhydrAMINE (BENADRYL) 25 mg capsule Take 25 mg by mouth every 6 (six) hours as needed for allergies.   gabapentin (NEURONTIN) 300 MG capsule Take 1 capsule (300 mg total) by mouth 3 (three) times daily.   glucose blood (TRUE METRIX BLOOD GLUCOSE TEST) test strip Use as directed twice dailyDx E11.65   glyBURIDE (DIABETA) 5 MG tablet Take 1 tablet (5 mg total) by mouth 2 (two) times daily with  a meal.   Lancets (ACCU-CHEK SOFT TOUCH) lancets Use as instructed   lovastatin (MEVACOR) 10 MG tablet Take 1 tab  Po QHS   meloxicam (MOBIC) 15 MG tablet Take 15 mg by mouth daily.   metoprolol succinate (TOPROL-XL) 50 MG 24 hr tablet Take 1 tablet (50 mg total) by mouth daily. Take with or immediately following a meal.   mometasone-formoterol (DULERA) 100-5 MCG/ACT AERO Inhale 2 puffs into the lungs 2 (two) times daily as needed for wheezing.   simethicone (MYLICON) 80 MG chewable tablet Chew 1 tablet (80 mg total) by mouth 4 (four) times daily.   TRUEplus Lancets 30G MISC by Does not apply route. Use as directed twice a daily Dx E11.65   No facility-administered encounter medications on file as of 05/14/2021.    Reviewed chart prior to disease state call. Spoke with patient regarding BP  Recent Office Vitals: BP Readings from Last 3 Encounters:  04/28/21 (!) 95/59  04/02/21 112/70  01/28/21 119/62   Pulse Readings from Last 3 Encounters:  04/28/21 86  04/02/21 76  01/28/21 77    Wt Readings from Last 3 Encounters:  04/20/21 160 lb 0.9 oz (72.6 kg)  04/02/21 160 lb 3.2 oz (72.7 kg)  01/28/21 155 lb (70.3 kg)     Kidney Function Lab Results  Component Value Date/Time   CREATININE 0.99 04/28/2021 05:13 AM   CREATININE 0.84 04/26/2021 05:59 AM   CREATININE 1.30 04/17/2014  10:26 AM   CREATININE 1.60 (H) 12/17/2013 04:12 PM   GFRNONAA >60 04/28/2021 05:13 AM   GFRNONAA 59 (L) 04/17/2014 10:26 AM   GFRAA >60 06/20/2020 10:01 AM   GFRAA >60 04/17/2014 10:26 AM    BMP Latest Ref Rng & Units 04/28/2021 04/26/2021 04/25/2021  Glucose 70 - 99 mg/dL - 194(H) 196(H)  BUN 8 - 23 mg/dL - 36(H) 26(H)  Creatinine 0.61 - 1.24 mg/dL 0.99 0.84 0.89  Sodium 135 - 145 mmol/L - 135 137  Potassium 3.5 - 5.1 mmol/L - 4.8 4.5  Chloride 98 - 111 mmol/L - 103 108  CO2 22 - 32 mmol/L - 25 25  Calcium 8.9 - 10.3 mg/dL - 8.4(L) 8.0(L)    Current antihypertensive regimen:  Metoprolol to 12.5 mg  daily What recent interventions/DTPs have been made by any provider to improve Blood Pressure control since last CPP Visit: None.  Any recent hospitalizations or ED visits since last visit with CPP? Yes, documented above.   Adherence Review: Is the patient currently on ACE/ARB medication? N/A.   Does the patient have >5 day gap between last estimated fill dates? N/A.  Star Rating Drugs: Lovastatin 10 mg 90 DS 03/16/21  Third unsuccessful telephone outreach was attempted today. The patient was referred to the pharmacist for assistance with care management and care coordination.   Follow-Up:Pharmacist Review  Charlann Lange, [place] Pharmacist Assistant [phone]

## 2021-05-19 ENCOUNTER — Ambulatory Visit (INDEPENDENT_AMBULATORY_CARE_PROVIDER_SITE_OTHER): Payer: Medicare Other | Admitting: Vascular Surgery

## 2021-05-19 ENCOUNTER — Encounter (INDEPENDENT_AMBULATORY_CARE_PROVIDER_SITE_OTHER): Payer: Medicare Other

## 2021-05-21 ENCOUNTER — Other Ambulatory Visit: Payer: Self-pay

## 2021-05-21 ENCOUNTER — Ambulatory Visit (INDEPENDENT_AMBULATORY_CARE_PROVIDER_SITE_OTHER): Payer: Medicare HMO | Admitting: Nurse Practitioner

## 2021-05-21 VITALS — BP 120/70 | HR 66 | Resp 15

## 2021-05-21 DIAGNOSIS — Z89512 Acquired absence of left leg below knee: Secondary | ICD-10-CM

## 2021-05-21 MED ORDER — DOXYCYCLINE HYCLATE 100 MG PO CAPS: 100.0000 mg | ORAL_CAPSULE | Freq: Two times a day (BID) | ORAL | 0 refills | Status: DC

## 2021-05-25 DIAGNOSIS — M6281 Muscle weakness (generalized): Secondary | ICD-10-CM | POA: Diagnosis not present

## 2021-05-25 DIAGNOSIS — Z4781 Encounter for orthopedic aftercare following surgical amputation: Secondary | ICD-10-CM | POA: Diagnosis not present

## 2021-05-25 DIAGNOSIS — I428 Other cardiomyopathies: Secondary | ICD-10-CM | POA: Diagnosis not present

## 2021-05-25 DIAGNOSIS — J449 Chronic obstructive pulmonary disease, unspecified: Secondary | ICD-10-CM | POA: Diagnosis not present

## 2021-05-25 DIAGNOSIS — Z89512 Acquired absence of left leg below knee: Secondary | ICD-10-CM | POA: Diagnosis not present

## 2021-05-26 ENCOUNTER — Other Ambulatory Visit: Payer: Self-pay

## 2021-05-26 NOTE — Patient Outreach (Signed)
[facility]) Care Management  05/26/2021  [name] 04/01/1951 [id number]   Referral Date: 05/26/21 Referral Source: [employer] Report Date of Discharge:05/24/21 Facility: [facility] Insurance:   Outreach attempt: no answer. HIPAA compliant voice message left.     Plan: RN CM will attempt again within 4 business days and send letter.   [name], RN, MSN [facility] Care Management Care Management Coordinator Direct Line [phone] Toll Free: [phone]  Fax: [phone]

## 2021-05-27 ENCOUNTER — Other Ambulatory Visit: Payer: Self-pay | Admitting: Internal Medicine

## 2021-05-27 ENCOUNTER — Other Ambulatory Visit: Payer: Self-pay

## 2021-05-27 DIAGNOSIS — I471 Supraventricular tachycardia: Secondary | ICD-10-CM | POA: Diagnosis not present

## 2021-05-27 DIAGNOSIS — M86172 Other acute osteomyelitis, left ankle and foot: Secondary | ICD-10-CM | POA: Diagnosis not present

## 2021-05-27 DIAGNOSIS — E11319 Type 2 diabetes mellitus with unspecified diabetic retinopathy without macular edema: Secondary | ICD-10-CM | POA: Diagnosis not present

## 2021-05-27 DIAGNOSIS — J449 Chronic obstructive pulmonary disease, unspecified: Secondary | ICD-10-CM | POA: Diagnosis not present

## 2021-05-27 DIAGNOSIS — E1165 Type 2 diabetes mellitus with hyperglycemia: Secondary | ICD-10-CM

## 2021-05-27 DIAGNOSIS — E1169 Type 2 diabetes mellitus with other specified complication: Secondary | ICD-10-CM | POA: Diagnosis not present

## 2021-05-27 DIAGNOSIS — Z4781 Encounter for orthopedic aftercare following surgical amputation: Secondary | ICD-10-CM | POA: Diagnosis not present

## 2021-05-27 DIAGNOSIS — I10 Essential (primary) hypertension: Secondary | ICD-10-CM | POA: Diagnosis not present

## 2021-05-27 DIAGNOSIS — K811 Chronic cholecystitis: Secondary | ICD-10-CM | POA: Diagnosis not present

## 2021-05-27 DIAGNOSIS — D649 Anemia, unspecified: Secondary | ICD-10-CM | POA: Diagnosis not present

## 2021-05-27 NOTE — Patient Instructions (Signed)
Goals Addressed               This Visit's Progress     Careful Skin Care-incision to left BKA site (pt-stated)   On track     Barriers: Knowledge Timeframe:  Short-Term Goal Priority:  High Start Date:   11/25/20                          Expected End Date:  07/27/21           Follow Up Date  06/26/21  - clean and dry skin well  -keep wound vac intact -monitor for signs such as redness, swelling or pain to site   Why is this important?    Taking really good care of your skin will help to keep your skin unbroken.    Notes: 11/25/20 Patient reports wound vac intact with no redness or swelling.  12/03/20 Patient to wear wound vac for 2 more weeks. 12/09/20 Wound making progress per patient 12/22/20 Wound making progress 01/15/21 Patient reports some wound closure.  Patient recently saw podiatrist.   02/19/21 Wound vac discontinued.  Wound clean and dry.  Home health changing dressing weekly per patient. 03/26/21 Wound to heel completely healed.  Advised to keep pressure off wound.   05/27/21 Patient with new left BKA incision site. Patient reports area with no redness or drainage. Home health involved.  Patient reports being independent with basic ADL's. Daughter helps as needed.  Wound Management Discussed Monitor for swelling, redness, pain, pus, and fever Keep wound clean and dry.   Notify physician immediately for any changes       Monitor and Manage My Blood Sugar-Diabetes Type 2   On track     Barriers: Health Behaviors Knowledge Timeframe:  Long-Range Goal Priority:  High Start Date:    11/25/20                         Expected End Date:   09/26/21 Follow Up Date 06/26/21 - check blood sugar at prescribed times    Why is this important?   Checking your blood sugar at home helps to keep it from getting very high or very low.  Writing the results in a diary or log helps the doctor know how to care for you.  Your blood sugar log should have the time, date and the results.  Also,  write down the amount of insulin or other medicine that you take.  Other information, like what you ate, exercise done and how you were feeling, will also be helpful.     Notes: Patient currently not checking encouraged to check. 12/03/20 Patient not checking sugars currently. Encouraged patient to check sugars. 12/09/20 Patient checked blood sugar it was 223.   12/22/20  PLEASE CHECK YOUR BLOOD SUGARS. 01/15/21 Please check your sugars.   02/17/21 Not checking sugars at home.  "Hard to bleed"  02/19/21 Not checking sugars. He states he does not bleed.  Discussed methods to promote bleeding. 03/26/21 Patient reports checking sugars now.  Encouraged to continue.  Blood sugar this am was 190.   Diabetes Management Discussed: Medication adherence Reviewed importance of limiting carbs such as rice, potatoes, breads, and pastas. Also discussed limiting sweets and sugary drinks.  Discussed importance of portion control.  Also discussed importance of annual exams, foot exams, and eye exams.   05/27/21 Patient reports last sugar check was 202.  Reviewed diabetes management  and importance. Patient checks sugars at least weekly.  Diabetes Management Discussed: Medication adherence Reviewed importance of limiting carbs such as rice, potatoes, breads, and pastas. Also discussed limiting sweets and sugary drinks.  Discussed importance of portion control.  Also discussed importance of annual exams, foot exams, and eye exams.

## 2021-05-27 NOTE — Patient Outreach (Signed)
[name]) Care Management  [place]  05/27/2021   [name] 1951-03-31 [id number]  Subjective: Telephone call to patient for follow up after discharge from rehab after left BKA. Patient reports doing well. Able to complete daily tasks with no assistance per patient. Daughter comes by to assist as needed and brings groceries and takes to appointments.  Patient also reports having a step son who helps as needed too.  Home Health involved and patient states nurse due to come by today.    Discussed wound care and signs of infection. He verbalized understanding.  Discussed [facility] services. Discussed care manager starting back with telephonic outreach. Patient is agreeable.   Objective:   Encounter Medications:  Outpatient Encounter Medications as of 05/27/2021  Medication Sig   acetaminophen (TYLENOL) 325 MG tablet Take 650 mg by mouth every 6 (six) hours as needed for moderate pain.   aspirin EC 81 MG tablet Take 81 mg by mouth daily.   Blood Glucose Calibration (TRUE METRIX LEVEL 1) Low SOLN Use as directed when needed dx E11.65   Blood Glucose Monitoring Suppl (TRUE METRIX GO GLUCOSE METER) w/Device KIT Use as directed to check glucose DX e11.65   Calcium Carbonate-Vitamin D (CALCIUM PLUS VITAMIN D PO) Take 1 tablet by mouth daily.   clopidogrel (PLAVIX) 75 MG tablet Take 1 tablet (75 mg total) by mouth daily.   Continuous Blood Gluc Receiver (DEXCOM G6 RECEIVER) DEVI Use as directed every 14 days to monitor blood sugars   cyclobenzaprine (FLEXERIL) 10 MG tablet Take 1 tablet (10 mg total) by mouth 3 (three) times daily as needed for muscle spasms.   diphenhydrAMINE (BENADRYL) 25 mg capsule Take 25 mg by mouth every 6 (six) hours as needed for allergies.   doxycycline (VIBRAMYCIN) 100 MG capsule Take 1 capsule (100 mg total) by mouth 2 (two) times daily.   gabapentin (NEURONTIN) 300 MG capsule Take 1 capsule (300 mg total) by mouth 3 (three) times daily.    glucose blood (TRUE METRIX BLOOD GLUCOSE TEST) test strip Use as directed twice dailyDx E11.65   glyBURIDE (DIABETA) 5 MG tablet Take 1 tablet (5 mg total) by mouth 2 (two) times daily with a meal.   Lancets (ACCU-CHEK SOFT TOUCH) lancets Use as instructed   lovastatin (MEVACOR) 10 MG tablet Take 1 tab  Po QHS   meloxicam (MOBIC) 15 MG tablet Take 15 mg by mouth daily.   metoprolol succinate (TOPROL-XL) 50 MG 24 hr tablet Take 1 tablet (50 mg total) by mouth daily. Take with or immediately following a meal.   mometasone-formoterol (DULERA) 100-5 MCG/ACT AERO Inhale 2 puffs into the lungs 2 (two) times daily as needed for wheezing.   simethicone (MYLICON) 80 MG chewable tablet Chew 1 tablet (80 mg total) by mouth 4 (four) times daily.   TRUEplus Lancets 30G MISC by Does not apply route. Use as directed twice a daily Dx E11.65   Continuous Blood Gluc Sensor (DEXCOM G6 SENSOR) MISC Use as directed every 14 days to monitor blood sugars   Continuous Blood Gluc Transmit (DEXCOM G6 TRANSMITTER) MISC Use as directed every 14 days to monitor blood sugars   No facility-administered encounter medications on file as of 05/27/2021.    Functional Status:  In your present state of health, do you have any difficulty performing the following activities: 05/27/2021 04/21/2021  Hearing? N N  Vision? N N  Difficulty concentrating or making decisions? N N  Walking or climbing stairs? Tempie Donning  Comment left BKA -  Dressing or bathing? N N  Doing errands, shopping? Y N  Comment step daughter assists -  Conservation officer, nature and eating ? N -  Using the Toilet? N -  In the past six months, have you accidently leaked urine? N -  Do you have problems with loss of bowel control? N -  Managing your Medications? Y -  Comment step daughter assists -  Managing your Finances? Y -  Comment step daughter assists -  Housekeeping or managing your Housekeeping? Y -  Comment step daughter assists -  Some recent data might be hidden     Fall/Depression Screening: Fall Risk  05/27/2021 04/02/2021 03/26/2021  Falls in the past year? 0 0 0  Number falls in past yr: - - -  Injury with Fall? - - -  Risk for fall due to : - No Fall Risks -  Follow up - Falls evaluation completed -   PHQ 2/9 Scores 05/27/2021 04/02/2021 03/26/2021 01/28/2021 12/30/2020 11/25/2020 09/22/2020  PHQ - 2 Score 0 0 0 0 0 0 0    Assessment:   Care Plan Care Plan : General Plan of Care (Adult)  Updates made by John Billings, RN since 05/27/2021 12:00 AM     Problem: [facility] (General Plan of Care)      Goal: Self-Management Plan Developed as evidenced by patient monitoring left BKA site for redness, swelling and drainage.   Start Date: 05/27/2021  Expected End Date: 07/27/2021  This Visit's Progress: On track  Priority: High  Note:   Evidence-based guidance:  Review biopsychosocial determinants of health screens.  Determine level of modifiable health risk.  Assess level of patient activation, level of readiness, importance and confidence to make changes.  Evoke change talk using open-ended questions, pros and cons, as well as looking forward.  Identify areas where behavior change may lead to improved health.  Partner with patient to develop a robust self-management plan that includes lifestyle factors, such as weight loss, exercise and healthy nutrition, as well as goals specific to disease risks.  Support patient and family/caregiver active participation in decision-making and self-management plan.  Implement additional goals and interventions based on identified risk factors to reduce health risk.  Facilitate advance care planning.  Review need for preventive screening based on age, sex, family history and health history.   Notes:     Task: Mutually Develop and [name] Achievement of Patient Goals   Due Date: 07/27/2021  Priority: Routine  Responsible User: John Billings, RN  Note:   Care Management Activities:    -  collaboration with team encouraged - questions answered - reassurance provided    Notes: 05/27/21 Patient with recent left BKA after foot wound infection.  Patient reports wound is dressed with gauze. He denies any redness, swelling or drainage.  Home health involved. Wound Management Discussed Monitor for swelling, redness, pain, pus, and fever Keep wound clean and dry.   Notify physician immediately for any changes      Problem: Health Promotion as evidenced by patient monitoring left leg BKA for infection   Priority: High  Onset Date: 05/27/2021     Care Plan : Diabetes Type 2 (Adult)  Updates made by John Billings, RN since 05/27/2021 12:00 AM     Problem: Glycemic Management (Diabetes, Type 2)      Long-Range Goal: Glycemic Management Optimized as evidenced by A1c less than 8.5   Start Date: 11/25/2020  Expected End Date: 09/26/2021  This  Visit's Progress: On track  Recent Progress: On track  Priority: Medium  Note:   Evidence-based guidance:  Promote self-monitoring of blood glucose levels.  Assess and address barriers to management plan, such as food insecurity, age,  developmental ability, depression, anxiety, fear of hypoglycemia or weight gain,  as well as medication cost, side effects and complicated regimen.  Notes: 01/15/21 Patient still not checking sugars. Discussed rationale for checking sugars.      Task: Alleviate Barriers to Glycemic Management   Due Date: 09/26/2021  Priority: Routine  Responsible User: John Billings, RN  Note:   Care Management Activities:    - barriers to adherence to treatment plan identified - blood glucose monitoring encouraged - use of blood glucose monitoring log promoted    Notes: Patient has not checked blood sugar since being at home yet.  Encouraged patient to check sugars regularly. 01/15/21 Patient still not checking sugars.  Denies any barriers to checking sugars. CM continues to encourage to check sugars.  12/22/20  Patient still not checking sugars regularly.  Patient reports no barriers to checking sugars such as machine not working or supplies or inability to check.  02/19/21 Patient continues not to check sugars.  Encouraged to check. Discussed carbohydrate limiting.  A1c 7.1. 05/27/21 Patient last sugar check 202. Patient checks sugar at least weekly. Discussed importance of monitoring sugar regularly.   Diabetes Management Discussed: Medication adherence Reviewed importance of limiting carbs such as rice, potatoes, breads, and pastas. Also discussed limiting sweets and sugary drinks.  Discussed importance of portion control.  Also discussed importance of annual exams, foot exams, and eye exams.        Goals Addressed               This Visit's Progress     Careful Skin Care-incision to left BKA site (pt-stated)   On track     Barriers: Knowledge Timeframe:  Short-Term Goal Priority:  High Start Date:   11/25/20                          Expected End Date:  07/27/21           Follow Up Date  06/26/21  - clean and dry skin well  -keep wound vac intact -monitor for signs such as redness, swelling or pain to site   Why is this important?    Taking really good care of your skin will help to keep your skin unbroken.    Notes: 11/25/20 Patient reports wound vac intact with no redness or swelling.  12/03/20 Patient to wear wound vac for 2 more weeks. 12/09/20 Wound making progress per patient 12/22/20 Wound making progress 01/15/21 Patient reports some wound closure.  Patient recently saw podiatrist.   02/19/21 Wound vac discontinued.  Wound clean and dry.  Home health changing dressing weekly per patient. 03/26/21 Wound to heel completely healed.  Advised to keep pressure off wound.   05/27/21 Patient with new left BKA incision site. Patient reports area with no redness or drainage. Home health involved.  Patient reports being independent with basic ADL's. Daughter helps as needed.  Wound Management  Discussed Monitor for swelling, redness, pain, pus, and fever Keep wound clean and dry.   Notify physician immediately for any changes       Monitor and Manage My Blood Sugar-Diabetes Type 2   On track     Barriers: Health Behaviors Knowledge Timeframe:  Long-Range Goal Priority:  High Start Date:    11/25/20                         Expected End Date:   09/26/21 Follow Up Date 06/26/21 - check blood sugar at prescribed times    Why is this important?   Checking your blood sugar at home helps to keep it from getting very high or very low.  Writing the results in a diary or log helps the doctor know how to care for you.  Your blood sugar log should have the time, date and the results.  Also, write down the amount of insulin or other medicine that you take.  Other information, like what you ate, exercise done and how you were feeling, will also be helpful.     Notes: Patient currently not checking encouraged to check. 12/03/20 Patient not checking sugars currently. Encouraged patient to check sugars. 12/09/20 Patient checked blood sugar it was 223.   12/22/20  PLEASE CHECK YOUR BLOOD SUGARS. 01/15/21 Please check your sugars.   02/17/21 Not checking sugars at home.  "Hard to bleed"  02/19/21 Not checking sugars. He states he does not bleed.  Discussed methods to promote bleeding. 03/26/21 Patient reports checking sugars now.  Encouraged to continue.  Blood sugar this am was 190.   Diabetes Management Discussed: Medication adherence Reviewed importance of limiting carbs such as rice, potatoes, breads, and pastas. Also discussed limiting sweets and sugary drinks.  Discussed importance of portion control.  Also discussed importance of annual exams, foot exams, and eye exams.   05/27/21 Patient reports last sugar check was 202.  Reviewed diabetes management and importance. Patient checks sugars at least weekly.  Diabetes Management Discussed: Medication adherence Reviewed importance of limiting  carbs such as rice, potatoes, breads, and pastas. Also discussed limiting sweets and sugary drinks.  Discussed importance of portion control.  Also discussed importance of annual exams, foot exams, and eye exams.          Plan: RN CM will provide ongoing education and support to patient through phone calls.   RN CM will send welcome packet with consent to patient.   RN CM will send initial barriers letter, assessment, and care plan to primary care physician.   Follow-up: Patient agrees to Care Plan and Follow-up. Follow-up in 1-2 week(s)  John Baseman, RN, MSN [place] [facility] Care Management Coordinator Direct Line [phone] Cell [phone] Toll Free: [phone]  Fax: [phone]

## 2021-05-28 DIAGNOSIS — I471 Supraventricular tachycardia: Secondary | ICD-10-CM | POA: Diagnosis not present

## 2021-05-28 DIAGNOSIS — E1169 Type 2 diabetes mellitus with other specified complication: Secondary | ICD-10-CM | POA: Diagnosis not present

## 2021-05-28 DIAGNOSIS — I10 Essential (primary) hypertension: Secondary | ICD-10-CM | POA: Diagnosis not present

## 2021-05-28 DIAGNOSIS — M86172 Other acute osteomyelitis, left ankle and foot: Secondary | ICD-10-CM | POA: Diagnosis not present

## 2021-05-28 DIAGNOSIS — J449 Chronic obstructive pulmonary disease, unspecified: Secondary | ICD-10-CM | POA: Diagnosis not present

## 2021-05-28 DIAGNOSIS — E11319 Type 2 diabetes mellitus with unspecified diabetic retinopathy without macular edema: Secondary | ICD-10-CM | POA: Diagnosis not present

## 2021-05-28 DIAGNOSIS — K811 Chronic cholecystitis: Secondary | ICD-10-CM | POA: Diagnosis not present

## 2021-05-28 DIAGNOSIS — Z4781 Encounter for orthopedic aftercare following surgical amputation: Secondary | ICD-10-CM | POA: Diagnosis not present

## 2021-05-28 DIAGNOSIS — D649 Anemia, unspecified: Secondary | ICD-10-CM | POA: Diagnosis not present

## 2021-05-29 ENCOUNTER — Telehealth: Payer: Self-pay

## 2021-05-29 ENCOUNTER — Telehealth: Payer: Self-pay | Admitting: Pharmacist

## 2021-05-29 DIAGNOSIS — Z4781 Encounter for orthopedic aftercare following surgical amputation: Secondary | ICD-10-CM | POA: Diagnosis not present

## 2021-05-29 DIAGNOSIS — E11319 Type 2 diabetes mellitus with unspecified diabetic retinopathy without macular edema: Secondary | ICD-10-CM | POA: Diagnosis not present

## 2021-05-29 DIAGNOSIS — I471 Supraventricular tachycardia: Secondary | ICD-10-CM | POA: Diagnosis not present

## 2021-05-29 DIAGNOSIS — K811 Chronic cholecystitis: Secondary | ICD-10-CM | POA: Diagnosis not present

## 2021-05-29 DIAGNOSIS — D649 Anemia, unspecified: Secondary | ICD-10-CM | POA: Diagnosis not present

## 2021-05-29 DIAGNOSIS — J449 Chronic obstructive pulmonary disease, unspecified: Secondary | ICD-10-CM | POA: Diagnosis not present

## 2021-05-29 DIAGNOSIS — I10 Essential (primary) hypertension: Secondary | ICD-10-CM | POA: Diagnosis not present

## 2021-05-29 DIAGNOSIS — M86172 Other acute osteomyelitis, left ankle and foot: Secondary | ICD-10-CM | POA: Diagnosis not present

## 2021-05-29 DIAGNOSIS — E1169 Type 2 diabetes mellitus with other specified complication: Secondary | ICD-10-CM | POA: Diagnosis not present

## 2021-05-29 NOTE — Telephone Encounter (Signed)
[name] with [name] [phone] called requesting OT verbal orders for 1 x a week for 1 week 2 x a week for 2 weeks 1 x a week for 1 week I gave verbal approval.

## 2021-05-29 NOTE — Progress Notes (Addendum)
Chronic Care Management Pharmacy Assistant   Name: [name]  MRN: [id number] DOB: 1951/09/26  Reason for Encounter: General Disease State Call   Conditions to be addressed/monitored: HTN, PAD, SVT, GERD, Type II DM, Lipids, Arthritis  Recent office visits:  None since 05/14/21  Recent consult visits:  05/21/21 Vasc Surg Kris Hartmann, NP. For follow-up. STARTED Doxycyline 100 mg 2 times daily.   Hospital visits:  None since 05/14/21  Medications: Outpatient Encounter Medications as of 05/29/2021  Medication Sig   acetaminophen (TYLENOL) 325 MG tablet Take 650 mg by mouth every 6 (six) hours as needed for moderate pain.   aspirin EC 81 MG tablet Take 81 mg by mouth daily.   Blood Glucose Calibration (TRUE METRIX LEVEL 1) Low SOLN Use as directed when needed dx E11.65   Blood Glucose Monitoring Suppl (TRUE METRIX GO GLUCOSE METER) w/Device KIT Use as directed to check glucose DX e11.65   Calcium Carbonate-Vitamin D (CALCIUM PLUS VITAMIN D PO) Take 1 tablet by mouth daily.   clopidogrel (PLAVIX) 75 MG tablet Take 1 tablet (75 mg total) by mouth daily.   Continuous Blood Gluc Receiver (DEXCOM G6 RECEIVER) DEVI Use as directed every 14 days to monitor blood sugars   Continuous Blood Gluc Sensor (DEXCOM G6 SENSOR) MISC Use as directed every 14 days to monitor blood sugars   Continuous Blood Gluc Transmit (DEXCOM G6 TRANSMITTER) MISC Use as directed every 14 days to monitor blood sugars   cyclobenzaprine (FLEXERIL) 10 MG tablet Take 1 tablet (10 mg total) by mouth 3 (three) times daily as needed for muscle spasms.   diphenhydrAMINE (BENADRYL) 25 mg capsule Take 25 mg by mouth every 6 (six) hours as needed for allergies.   doxycycline (VIBRAMYCIN) 100 MG capsule Take 1 capsule (100 mg total) by mouth 2 (two) times daily.   gabapentin (NEURONTIN) 300 MG capsule Take 1 capsule (300 mg total) by mouth 3 (three) times daily.   glucose blood (TRUE METRIX BLOOD GLUCOSE TEST) test strip  Use as directed twice dailyDx E11.65   glyBURIDE (DIABETA) 5 MG tablet TAKE 1 TABLET TWICE DAILY WITH MEALS   Lancets (ACCU-CHEK SOFT TOUCH) lancets Use as instructed   lovastatin (MEVACOR) 10 MG tablet Take 1 tab  Po QHS   meloxicam (MOBIC) 15 MG tablet Take 15 mg by mouth daily.   metoprolol succinate (TOPROL-XL) 50 MG 24 hr tablet Take 1 tablet (50 mg total) by mouth daily. Take with or immediately following a meal.   mometasone-formoterol (DULERA) 100-5 MCG/ACT AERO Inhale 2 puffs into the lungs 2 (two) times daily as needed for wheezing.   simethicone (MYLICON) 80 MG chewable tablet Chew 1 tablet (80 mg total) by mouth 4 (four) times daily.   TRUEplus Lancets 30G MISC by Does not apply route. Use as directed twice a daily Dx E11.65   No facility-administered encounter medications on file as of 05/29/2021.   GEN CALL: Patient stated he is doing fine. He stated he does not have any questions or concerns about his medications at this time. He stated he is healing very well, he stated there is no infection. He stated there are people at the home to help him and he is pretty co dependent.   Star Rating Drugs: Lovastatin 10 mg 03/16/21 90 DS.   Follow-Up:Pharmacist Review  Charlann Lange, RMA Clinical Pharmacist Assistant [phone]  5 minutes spent in review, coordination, and documentation.  Reviewed by: Beverly Milch, PharmD Clinical Pharmacist [phone]

## 2021-05-31 ENCOUNTER — Other Ambulatory Visit: Payer: Self-pay | Admitting: Internal Medicine

## 2021-05-31 DIAGNOSIS — M542 Cervicalgia: Secondary | ICD-10-CM

## 2021-05-31 DIAGNOSIS — G8929 Other chronic pain: Secondary | ICD-10-CM

## 2021-05-31 NOTE — Progress Notes (Signed)
Subjective:    Patient ID: [name], male    DOB: 07-19-1951, 70 y.o.   MRN: [id number] Chief Complaint  Patient presents with  . Follow-up    Le angio amputation with staple removal    [name] is a 70 year old male that presents today for follow-up evaluation of left below-knee amputation.  The patient is tolerating well so far.  He still has staples in place.  There is redness around the wound and the patient does have some tenderness.  He denies any fevers or chills.  He denies any open wounds.  He denies any purulent or foul-smelling drainage.  The patient does have some phantom limb pain.  He is currently in rehab and doing well.  Review of Systems  Skin:  Positive for wound.  Neurological:  Positive for weakness.  All other systems reviewed and are negative.     Objective:   Physical Exam Vitals reviewed.  HENT:     Head: Normocephalic.  Cardiovascular:     Rate and Rhythm: Normal rate.     Pulses: Normal pulses.  Pulmonary:     Effort: Pulmonary effort is normal.  Musculoskeletal:     Left Lower Extremity: Left leg is amputated below knee.  Neurological:     Mental Status: He is alert and oriented to person, place, and time.  Psychiatric:        Mood and Affect: Mood normal.        Behavior: Behavior normal.        Thought Content: Thought content normal.        Judgment: Judgment normal.    BP 120/70 (BP Location: Right Arm)   Pulse 66   Resp 15   Past Medical History:  Diagnosis Date  . Cholecystitis, chronic   . Cholelithiasis   . COPD (chronic obstructive pulmonary disease) ([place])   . Diabetes mellitus without complication ([place])   . Diabetic retinopathy ([place])   . Dyspnea   . Fatty liver 2008  . Fracture of clavicle 1992   left   . Fracture of ribs, multiple 1992   3 ribs  . GERD (gastroesophageal reflux disease)   . Hyperlipidemia   . Hypertension   . Paroxysmal SVT (supraventricular tachycardia) ([place])   . Pelvis fracture ([place]) 1992  .  Peripheral vascular disease ([place])   . Pleurisy   . Pleurisy   . Seasonal allergies     Social History   Socioeconomic History  . Marital status: Single    Spouse name: Not on file  . Number of children: Not on file  . Years of education: Not on file  . Highest education level: Not on file  Occupational History  . Not on file  Tobacco Use  . Smoking status: Former    Types: Cigarettes    Quit date: 02/24/1997    Years since quitting: 24.2  . Smokeless tobacco: Never  Vaping Use  . Vaping Use: Never used  Substance and Sexual Activity  . Alcohol use: No  . Drug use: No  . Sexual activity: Not on file  Other Topics Concern  . Not on file  Social History Narrative  . Not on file   Social Determinants of Health   Financial Resource Strain: Low Risk   . Difficulty of Paying Living Expenses: Not very hard  Food Insecurity: No Food Insecurity  . Worried About Charity fundraiser in the Last Year: Never true  . Ran Out of Food in  the Last Year: Never true  Transportation Needs: No Transportation Needs  . Lack of Transportation (Medical): No  . Lack of Transportation (Non-Medical): No  Physical Activity: Not on file  Stress: Not on file  Social Connections: Not on file  Intimate Partner Violence: Not on file    Past Surgical History:  Procedure Laterality Date  . AMPUTATION Left 04/23/2021   Procedure: AMPUTATION BELOW KNEE;  Surgeon: [name], MD;  Location: [facility];  Service: General;  Laterality: Left;  . AMPUTATION TOE Left 02/25/2017   Procedure: AMPUTATION TOE-LEFT 2ND MPJ;  Surgeon: Samara Deist, DPM;  Location: [facility];  Service: Podiatry;  Laterality: Left;  . AMPUTATION TOE Left 10/20/2018   Procedure: TOE MPJ T2 LEFT;  Surgeon: Samara Deist, DPM;  Location: [facility];  Service: Podiatry;  Laterality: Left;  . AMPUTATION TOE Left 01/12/2019   Procedure: AMPUTATION LEFT 5TH TOE AND JOINT;  Surgeon: Samara Deist, DPM;  Location: [facility];  Service:  Podiatry;  Laterality: Left;  . BACK SURGERY  2008  . BONE BIOPSY Left 11/21/2020   Procedure: BONE BIOPSY;  Surgeon: Samara Deist, DPM;  Location: [facility];  Service: Podiatry;  Laterality: Left;  . CHOLECYSTECTOMY    . COLONOSCOPY WITH PROPOFOL N/A 08/15/2017   Procedure: COLONOSCOPY WITH PROPOFOL;  Surgeon: Lollie Sails, MD;  Location: [facility];  Service: Endoscopy;  Laterality: N/A;  . ENDARTERECTOMY Right 05/12/2017   Procedure: ENDARTERECTOMY CAROTID;  Surgeon: [name], MD;  Location: [facility];  Service: Vascular;  Laterality: Right;  . ENDARTERECTOMY FEMORAL Left 12/01/2018   Procedure: ENDARTERECTOMY FEMORAL;  Surgeon: [name], MD;  Location: [facility];  Service: Vascular;  Laterality: Left;  . ERCP    . FOOT SURGERY Right    x 3  . IRRIGATION AND DEBRIDEMENT FOOT Left 11/21/2020   Procedure: IRRIGATION AND DEBRIDEMENT FOOT--with placement of wound vac;  Surgeon: Samara Deist, DPM;  Location: [facility];  Service: Podiatry;  Laterality: Left;  . LOWER EXTREMITY ANGIOGRAM Left 12/01/2018   Procedure: LOWER EXTREMITY ANGIOGRAM;  Surgeon: [name], MD;  Location: [facility];  Service: Vascular;  Laterality: Left;  . LOWER EXTREMITY ANGIOGRAPHY Left 10/30/2018   Procedure: LOWER EXTREMITY ANGIOGRAPHY;  Surgeon: [name], MD;  Location: [place] [facility];  Service: Cardiovascular;  Laterality: Left;  . LOWER EXTREMITY ANGIOGRAPHY Left 11/30/2018   Procedure: LOWER EXTREMITY ANGIOGRAPHY;  Surgeon: [name], MD;  Location: [place] [facility];  Service: Cardiovascular;  Laterality: Left;  . LOWER EXTREMITY ANGIOGRAPHY Left 11/17/2020   Procedure: Lower Extremity Angiography;  Surgeon: [name], MD;  Location: [place] [facility];  Service: Cardiovascular;  Laterality: Left;  . LOWER EXTREMITY ANGIOGRAPHY Left 04/22/2021   Procedure: Lower Extremity Angiography;  Surgeon: [name], MD;  Location: [place] [facility];  Service: Cardiovascular;  Laterality:  Left;  . TEE WITHOUT CARDIOVERSION N/A 11/14/2020   Procedure: TRANSESOPHAGEAL ECHOCARDIOGRAM (TEE);  Surgeon: Corey Skains, MD;  Location: [facility];  Service: Cardiovascular;  Laterality: N/A;    Family History  Problem Relation Age of Onset  . Diabetes Sister     Allergies  Allergen Reactions  . Penicillins Rash and Other (See Comments)    Rash in and around the mouth Has patient had a PCN reaction causing immediate rash, facial/tongue/throat swelling, SOB or lightheadedness with hypotension: Yes Has patient had a PCN reaction causing severe rash involving mucus membranes or skin necrosis: Yes Has patient had  a PCN reaction that required hospitalization: No Has patient had a PCN reaction occurring within the last 10 years: Yes If all of the above answers are "NO", then may proceed with Cephalosporin use.   . Bactrim [Sulfamethoxazole-Trimethoprim] Other (See Comments)    Mouth sores, Sores develop in mouth    CBC Latest Ref Rng & Units 04/26/2021 04/25/2021 04/25/2021  WBC 4.0 - 10.5 K/uL 4.6 - 3.6(L)  Hemoglobin 13.0 - 17.0 g/dL 9.0(L) 8.8(L) 7.3(L)  Hematocrit 39.0 - 52.0 % 28.0(L) 26.9(L) 23.1(L)  Platelets 150 - 400 K/uL 210 - 195      CMP     Component Value Date/Time   NA 135 04/26/2021 0559   NA 136 04/17/2014 1026   K 4.8 04/26/2021 0559   K 5.1 04/17/2014 1026   CL 103 04/26/2021 0559   CL 103 04/17/2014 1026   CO2 25 04/26/2021 0559   CO2 28 04/17/2014 1026   GLUCOSE 194 (H) 04/26/2021 0559   GLUCOSE 124 (H) 04/17/2014 1026   BUN 36 (H) 04/26/2021 0559   BUN 28 (H) 04/17/2014 1026   CREATININE 0.99 04/28/2021 0513   CREATININE 1.30 04/17/2014 1026   CALCIUM 8.4 (L) 04/26/2021 0559   CALCIUM 8.6 04/17/2014 1026   PROT 7.1 04/20/2021 1409   PROT 7.1 04/17/2014 1026   ALBUMIN 2.7 (L) 04/20/2021 1409   ALBUMIN 3.8 04/17/2014 1026   AST 17 04/20/2021 1409   AST 26 04/17/2014 1026   ALT 14 04/20/2021 1409   ALT 36 04/17/2014 1026   ALKPHOS 76  04/20/2021 1409   ALKPHOS 50 04/17/2014 1026   BILITOT 0.6 04/20/2021 1409   BILITOT 1.5 (H) 04/17/2014 1026   GFRNONAA >60 04/28/2021 0513   GFRNONAA 59 (L) 04/17/2014 1026   GFRAA >60 06/20/2020 1001   GFRAA >60 04/17/2014 1026     No results found.     Assessment & Plan:   1. Left below-knee amputee [facility]) She has some redness along the incision which is concerning for infection.  We will prescribe doxycycline to treat any possible cellulitis.  All staples were removed today without issue.  Steri-Strips were applied.  We will have the patient return in 2 weeks for wound evaluation.  In - doxycycline (VIBRAMYCIN) 100 MG capsule; Take 1 capsule (100 mg total) by mouth 2 (two) times daily.  Dispense: 28 capsule; Refill: 0   Current Outpatient Medications on File Prior to Visit  Medication Sig Dispense Refill  . acetaminophen (TYLENOL) 325 MG tablet Take 650 mg by mouth every 6 (six) hours as needed for moderate pain.    Marland Kitchen aspirin EC 81 MG tablet Take 81 mg by mouth daily.    . Blood Glucose Calibration (TRUE METRIX LEVEL 1) Low SOLN Use as directed when needed dx E11.65 1 each 0  . Blood Glucose Monitoring Suppl (TRUE METRIX GO GLUCOSE METER) w/Device KIT Use as directed to check glucose DX e11.65 1 kit 0  . Calcium Carbonate-Vitamin D (CALCIUM PLUS VITAMIN D PO) Take 1 tablet by mouth daily.    . clopidogrel (PLAVIX) 75 MG tablet Take 1 tablet (75 mg total) by mouth daily. 90 tablet 3  . Continuous Blood Gluc Receiver (DEXCOM G6 RECEIVER) DEVI Use as directed every 14 days to monitor blood sugars 3 each 3  . Continuous Blood Gluc Sensor (DEXCOM G6 SENSOR) MISC Use as directed every 14 days to monitor blood sugars 3 each 3  . Continuous Blood Gluc Transmit (DEXCOM G6 TRANSMITTER) MISC Use as  directed every 14 days to monitor blood sugars 3 each 3  . diphenhydrAMINE (BENADRYL) 25 mg capsule Take 25 mg by mouth every 6 (six) hours as needed for allergies.    Marland Kitchen gabapentin (NEURONTIN) 300  MG capsule Take 1 capsule (300 mg total) by mouth 3 (three) times daily. 90 capsule 1  . glucose blood (TRUE METRIX BLOOD GLUCOSE TEST) test strip Use as directed twice dailyDx E11.65 100 each 1  . Lancets (ACCU-CHEK SOFT TOUCH) lancets Use as instructed 100 each 12  . lovastatin (MEVACOR) 10 MG tablet Take 1 tab  Po QHS 90 tablet 1  . meloxicam (MOBIC) 15 MG tablet Take 15 mg by mouth daily.    . metoprolol succinate (TOPROL-XL) 50 MG 24 hr tablet Take 1 tablet (50 mg total) by mouth daily. Take with or immediately following a meal. 90 tablet 1  . mometasone-formoterol (DULERA) 100-5 MCG/ACT AERO Inhale 2 puffs into the lungs 2 (two) times daily as needed for wheezing. 1 each 3  . simethicone (MYLICON) 80 MG chewable tablet Chew 1 tablet (80 mg total) by mouth 4 (four) times daily. 30 tablet 0  . TRUEplus Lancets 30G MISC by Does not apply route. Use as directed twice a daily Dx E11.65     No current facility-administered medications on file prior to visit.    There are no Patient Instructions on file for this visit. No follow-ups on file.   Kris Hartmann, NP

## 2021-06-01 ENCOUNTER — Other Ambulatory Visit: Payer: Self-pay

## 2021-06-01 ENCOUNTER — Encounter (INDEPENDENT_AMBULATORY_CARE_PROVIDER_SITE_OTHER): Payer: Self-pay | Admitting: Nurse Practitioner

## 2021-06-01 DIAGNOSIS — G8929 Other chronic pain: Secondary | ICD-10-CM

## 2021-06-01 MED ORDER — CYCLOBENZAPRINE HCL 10 MG PO TABS: 10.0000 mg | ORAL_TABLET | Freq: Three times a day (TID) | ORAL | 1 refills | Status: DC | PRN

## 2021-06-02 DIAGNOSIS — E1169 Type 2 diabetes mellitus with other specified complication: Secondary | ICD-10-CM | POA: Diagnosis not present

## 2021-06-02 DIAGNOSIS — J449 Chronic obstructive pulmonary disease, unspecified: Secondary | ICD-10-CM | POA: Diagnosis not present

## 2021-06-02 DIAGNOSIS — K811 Chronic cholecystitis: Secondary | ICD-10-CM | POA: Diagnosis not present

## 2021-06-02 DIAGNOSIS — I10 Essential (primary) hypertension: Secondary | ICD-10-CM | POA: Diagnosis not present

## 2021-06-02 DIAGNOSIS — Z4781 Encounter for orthopedic aftercare following surgical amputation: Secondary | ICD-10-CM | POA: Diagnosis not present

## 2021-06-02 DIAGNOSIS — M86172 Other acute osteomyelitis, left ankle and foot: Secondary | ICD-10-CM | POA: Diagnosis not present

## 2021-06-02 DIAGNOSIS — D649 Anemia, unspecified: Secondary | ICD-10-CM | POA: Diagnosis not present

## 2021-06-02 DIAGNOSIS — E11319 Type 2 diabetes mellitus with unspecified diabetic retinopathy without macular edema: Secondary | ICD-10-CM | POA: Diagnosis not present

## 2021-06-02 DIAGNOSIS — I471 Supraventricular tachycardia: Secondary | ICD-10-CM | POA: Diagnosis not present

## 2021-06-04 ENCOUNTER — Other Ambulatory Visit: Payer: Self-pay

## 2021-06-04 ENCOUNTER — Ambulatory Visit (INDEPENDENT_AMBULATORY_CARE_PROVIDER_SITE_OTHER): Payer: Medicare HMO | Admitting: Nurse Practitioner

## 2021-06-04 VITALS — BP 134/75 | HR 85 | Ht 70.0 in | Wt 141.0 lb

## 2021-06-04 DIAGNOSIS — Z89512 Acquired absence of left leg below knee: Secondary | ICD-10-CM

## 2021-06-04 DIAGNOSIS — E11319 Type 2 diabetes mellitus with unspecified diabetic retinopathy without macular edema: Secondary | ICD-10-CM | POA: Diagnosis not present

## 2021-06-04 DIAGNOSIS — J449 Chronic obstructive pulmonary disease, unspecified: Secondary | ICD-10-CM | POA: Diagnosis not present

## 2021-06-04 DIAGNOSIS — I471 Supraventricular tachycardia: Secondary | ICD-10-CM | POA: Diagnosis not present

## 2021-06-04 DIAGNOSIS — J439 Emphysema, unspecified: Secondary | ICD-10-CM | POA: Diagnosis not present

## 2021-06-04 DIAGNOSIS — D649 Anemia, unspecified: Secondary | ICD-10-CM | POA: Diagnosis not present

## 2021-06-04 DIAGNOSIS — M86172 Other acute osteomyelitis, left ankle and foot: Secondary | ICD-10-CM | POA: Diagnosis not present

## 2021-06-04 DIAGNOSIS — I10 Essential (primary) hypertension: Secondary | ICD-10-CM | POA: Diagnosis not present

## 2021-06-04 DIAGNOSIS — K811 Chronic cholecystitis: Secondary | ICD-10-CM | POA: Diagnosis not present

## 2021-06-04 DIAGNOSIS — Z4781 Encounter for orthopedic aftercare following surgical amputation: Secondary | ICD-10-CM | POA: Diagnosis not present

## 2021-06-04 DIAGNOSIS — E1169 Type 2 diabetes mellitus with other specified complication: Secondary | ICD-10-CM | POA: Diagnosis not present

## 2021-06-05 DIAGNOSIS — E11319 Type 2 diabetes mellitus with unspecified diabetic retinopathy without macular edema: Secondary | ICD-10-CM | POA: Diagnosis not present

## 2021-06-05 DIAGNOSIS — J449 Chronic obstructive pulmonary disease, unspecified: Secondary | ICD-10-CM | POA: Diagnosis not present

## 2021-06-05 DIAGNOSIS — D649 Anemia, unspecified: Secondary | ICD-10-CM | POA: Diagnosis not present

## 2021-06-05 DIAGNOSIS — I10 Essential (primary) hypertension: Secondary | ICD-10-CM | POA: Diagnosis not present

## 2021-06-05 DIAGNOSIS — Z4781 Encounter for orthopedic aftercare following surgical amputation: Secondary | ICD-10-CM | POA: Diagnosis not present

## 2021-06-05 DIAGNOSIS — E1169 Type 2 diabetes mellitus with other specified complication: Secondary | ICD-10-CM | POA: Diagnosis not present

## 2021-06-05 DIAGNOSIS — K811 Chronic cholecystitis: Secondary | ICD-10-CM | POA: Diagnosis not present

## 2021-06-05 DIAGNOSIS — I471 Supraventricular tachycardia: Secondary | ICD-10-CM | POA: Diagnosis not present

## 2021-06-05 DIAGNOSIS — M86172 Other acute osteomyelitis, left ankle and foot: Secondary | ICD-10-CM | POA: Diagnosis not present

## 2021-06-06 DIAGNOSIS — M86172 Other acute osteomyelitis, left ankle and foot: Secondary | ICD-10-CM | POA: Diagnosis not present

## 2021-06-06 DIAGNOSIS — D649 Anemia, unspecified: Secondary | ICD-10-CM | POA: Diagnosis not present

## 2021-06-06 DIAGNOSIS — K811 Chronic cholecystitis: Secondary | ICD-10-CM | POA: Diagnosis not present

## 2021-06-06 DIAGNOSIS — E1169 Type 2 diabetes mellitus with other specified complication: Secondary | ICD-10-CM | POA: Diagnosis not present

## 2021-06-06 DIAGNOSIS — I10 Essential (primary) hypertension: Secondary | ICD-10-CM | POA: Diagnosis not present

## 2021-06-06 DIAGNOSIS — I471 Supraventricular tachycardia: Secondary | ICD-10-CM | POA: Diagnosis not present

## 2021-06-06 DIAGNOSIS — Z4781 Encounter for orthopedic aftercare following surgical amputation: Secondary | ICD-10-CM | POA: Diagnosis not present

## 2021-06-06 DIAGNOSIS — J449 Chronic obstructive pulmonary disease, unspecified: Secondary | ICD-10-CM | POA: Diagnosis not present

## 2021-06-06 DIAGNOSIS — E11319 Type 2 diabetes mellitus with unspecified diabetic retinopathy without macular edema: Secondary | ICD-10-CM | POA: Diagnosis not present

## 2021-06-08 ENCOUNTER — Other Ambulatory Visit: Payer: Self-pay

## 2021-06-08 ENCOUNTER — Encounter (INDEPENDENT_AMBULATORY_CARE_PROVIDER_SITE_OTHER): Payer: Self-pay | Admitting: Nurse Practitioner

## 2021-06-08 DIAGNOSIS — I10 Essential (primary) hypertension: Secondary | ICD-10-CM | POA: Diagnosis not present

## 2021-06-08 DIAGNOSIS — E1169 Type 2 diabetes mellitus with other specified complication: Secondary | ICD-10-CM | POA: Diagnosis not present

## 2021-06-08 DIAGNOSIS — Z4781 Encounter for orthopedic aftercare following surgical amputation: Secondary | ICD-10-CM | POA: Diagnosis not present

## 2021-06-08 DIAGNOSIS — K811 Chronic cholecystitis: Secondary | ICD-10-CM | POA: Diagnosis not present

## 2021-06-08 DIAGNOSIS — I471 Supraventricular tachycardia: Secondary | ICD-10-CM | POA: Diagnosis not present

## 2021-06-08 DIAGNOSIS — E11319 Type 2 diabetes mellitus with unspecified diabetic retinopathy without macular edema: Secondary | ICD-10-CM | POA: Diagnosis not present

## 2021-06-08 DIAGNOSIS — J449 Chronic obstructive pulmonary disease, unspecified: Secondary | ICD-10-CM | POA: Diagnosis not present

## 2021-06-08 DIAGNOSIS — D649 Anemia, unspecified: Secondary | ICD-10-CM | POA: Diagnosis not present

## 2021-06-08 DIAGNOSIS — M86172 Other acute osteomyelitis, left ankle and foot: Secondary | ICD-10-CM | POA: Diagnosis not present

## 2021-06-08 NOTE — Patient Outreach (Signed)
[facility]) Care Management  [place]  06/08/2021   GAD AYMOND 11-10-1950 [id number]  Subjective: Patient reports doing good. He reports left stump site healing.  Reviewed wound management.  He voices no concerns.    Objective:   Encounter Medications:  Outpatient Encounter Medications as of 06/08/2021  Medication Sig   acetaminophen (TYLENOL) 325 MG tablet Take 650 mg by mouth every 6 (six) hours as needed for moderate pain.   aspirin EC 81 MG tablet Take 81 mg by mouth daily.   Blood Glucose Calibration (TRUE METRIX LEVEL 1) Low SOLN Use as directed when needed dx E11.65   Blood Glucose Monitoring Suppl (TRUE METRIX GO GLUCOSE METER) w/Device KIT Use as directed to check glucose DX e11.65   Calcium Carbonate-Vitamin D (CALCIUM PLUS VITAMIN D PO) Take 1 tablet by mouth daily.   clopidogrel (PLAVIX) 75 MG tablet Take 1 tablet (75 mg total) by mouth daily.   Continuous Blood Gluc Receiver (DEXCOM G6 RECEIVER) DEVI Use as directed every 14 days to monitor blood sugars   Continuous Blood Gluc Sensor (DEXCOM G6 SENSOR) MISC Use as directed every 14 days to monitor blood sugars   Continuous Blood Gluc Transmit (DEXCOM G6 TRANSMITTER) MISC Use as directed every 14 days to monitor blood sugars   cyclobenzaprine (FLEXERIL) 10 MG tablet Take 1 tablet (10 mg total) by mouth 3 (three) times daily as needed for muscle spasms.   diphenhydrAMINE (BENADRYL) 25 mg capsule Take 25 mg by mouth every 6 (six) hours as needed for allergies.   doxycycline (VIBRAMYCIN) 100 MG capsule Take 1 capsule (100 mg total) by mouth 2 (two) times daily.   gabapentin (NEURONTIN) 300 MG capsule Take 1 capsule (300 mg total) by mouth 3 (three) times daily.   glucose blood (TRUE METRIX BLOOD GLUCOSE TEST) test strip Use as directed twice dailyDx E11.65   glyBURIDE (DIABETA) 5 MG tablet TAKE 1 TABLET TWICE DAILY WITH MEALS   Lancets (ACCU-CHEK SOFT TOUCH) lancets Use as instructed    lovastatin (MEVACOR) 10 MG tablet Take 1 tab  Po QHS   meloxicam (MOBIC) 15 MG tablet Take 15 mg by mouth daily.   metoprolol succinate (TOPROL-XL) 50 MG 24 hr tablet Take 1 tablet (50 mg total) by mouth daily. Take with or immediately following a meal.   mometasone-formoterol (DULERA) 100-5 MCG/ACT AERO Inhale 2 puffs into the lungs 2 (two) times daily as needed for wheezing.   simethicone (MYLICON) 80 MG chewable tablet Chew 1 tablet (80 mg total) by mouth 4 (four) times daily.   TRUEplus Lancets 30G MISC by Does not apply route. Use as directed twice a daily Dx E11.65   No facility-administered encounter medications on file as of 06/08/2021.    Functional Status:  In your present state of health, do you have any difficulty performing the following activities: 05/27/2021 04/21/2021  Hearing? N N  Vision? N N  Difficulty concentrating or making decisions? N N  Walking or climbing stairs? Y Y  Comment left BKA -  Dressing or bathing? N N  Doing errands, shopping? Y N  Comment step daughter assists -  Conservation officer, nature and eating ? N -  Using the Toilet? N -  In the past six months, have you accidently leaked urine? N -  Do you have problems with loss of bowel control? N -  Managing your Medications? Y -  Comment step daughter assists -  Managing your Finances? Y -  Comment step daughter  assists -  Housekeeping or managing your Housekeeping? Y -  Comment step daughter assists -  Some recent data might be hidden    Fall/Depression Screening: Fall Risk  05/27/2021 04/02/2021 03/26/2021  Falls in the past year? 0 0 0  Number falls in past yr: - - -  Injury with Fall? - - -  Risk for fall due to : - No Fall Risks -  Follow up - Falls evaluation completed -   PHQ 2/9 Scores 05/27/2021 04/02/2021 03/26/2021 01/28/2021 12/30/2020 11/25/2020 09/22/2020  PHQ - 2 Score 0 0 0 0 0 0 0    Assessment:   Care Plan Care Plan : General Plan of Care (Adult)  Updates made by [name], RN since  06/08/2021 12:00 AM     Problem: Therapeutic Alliance (General Plan of Care)      Goal: Self-Management Plan Developed as evidenced by patient monitoring left BKA site for redness, swelling and drainage.   Start Date: 05/27/2021  Expected End Date: 07/27/2021  This Visit's Progress: On track  Recent Progress: On track  Priority: High  Note:   Evidence-based guidance:  Review biopsychosocial determinants of health screens.  Determine level of modifiable health risk.  Assess level of patient activation, level of readiness, importance and confidence to make changes.  Evoke change talk using open-ended questions, pros and cons, as well as looking forward.  Identify areas where behavior change may lead to improved health.  Partner with patient to develop a robust self-management plan that includes lifestyle factors, such as weight loss, exercise and healthy nutrition, as well as goals specific to disease risks.  Support patient and family/caregiver active participation in decision-making and self-management plan.  Implement additional goals and interventions based on identified risk factors to reduce health risk.  Facilitate advance care planning.  Review need for preventive screening based on age, sex, family history and health history.   Notes:     Task: Mutually Develop and Royce Macadamia Achievement of Patient Goals   Due Date: 07/27/2021  Priority: Routine  Responsible User: [name], RN  Note:   Care Management Activities:    - collaboration with team encouraged - questions answered - reassurance provided    Notes: 05/27/21 Patient with recent left BKA after foot wound infection.  Patient reports wound is dressed with gauze. He denies any redness, swelling or drainage.  Home health involved. Wound Management Discussed Monitor for swelling, redness, pain, pus, and fever Keep wound clean and dry.   Notify physician immediately for any changes  06/08/21 Patient denies redness or  swelling to left stump site. Home health continues with dressing changes. Reviewed with patient signs of infection.       Care Plan : Diabetes Type 2 (Adult)  Updates made by [name], RN since 06/08/2021 12:00 AM     Problem: Glycemic Management (Diabetes, Type 2)      Long-Range Goal: Glycemic Management Optimized as evidenced by A1c less than 8.5   Start Date: 11/25/2020  Expected End Date: 09/26/2021  This Visit's Progress: On track  Recent Progress: On track  Priority: Medium  Note:   Evidence-based guidance:  Promote self-monitoring of blood glucose levels.  Assess and address barriers to management plan, such as food insecurity, age,  developmental ability, depression, anxiety, fear of hypoglycemia or weight gain,  as well as medication cost, side effects and complicated regimen.  Notes: 01/15/21 Patient still not checking sugars. Discussed rationale for checking sugars.      Task:  Alleviate Barriers to Glycemic Management   Due Date: 09/26/2021  Priority: Routine  Responsible User: [name], RN  Note:   Care Management Activities:    - barriers to adherence to treatment plan identified - blood glucose monitoring encouraged - use of blood glucose monitoring log promoted    Notes: Patient has not checked blood sugar since being at home yet.  Encouraged patient to check sugars regularly. 01/15/21 Patient still not checking sugars.  Denies any barriers to checking sugars. CM continues to encourage to check sugars.  12/22/20 Patient still not checking sugars regularly.  Patient reports no barriers to checking sugars such as machine not working or supplies or inability to check.  02/19/21 Patient continues not to check sugars.  Encouraged to check. Discussed carbohydrate limiting.  A1c 7.1. 05/27/21 Patient last sugar check 202. Patient checks sugar at least weekly. Discussed importance of monitoring sugar regularly.   Diabetes Management Discussed: Medication  adherence Reviewed importance of limiting carbs such as rice, potatoes, breads, and pastas. Also discussed limiting sweets and sugary drinks.  Discussed importance of portion control.  Also discussed importance of annual exams, foot exams, and eye exams.   06/08/21 Patient sugar this am was in the 200's per patient. He could not remember the exact number but states he had already eaten when he checked it.  Stressed importance of diabetes management in wound healing. Patient verbalized understanding.       Goals Addressed               This Visit's Progress     Careful Skin Care-incision to left BKA site (pt-stated)   On track     Barriers: Knowledge Timeframe:  Short-Term Goal Priority:  High Start Date:   11/25/20                          Expected End Date:  07/27/21           Follow Up Date  06/26/21  - clean and dry skin well  -keep wound vac intact -monitor for signs such as redness, swelling or pain to site   Why is this important?    Taking really good care of your skin will help to keep your skin unbroken.    Notes: 11/25/20 Patient reports wound vac intact with no redness or swelling.  12/03/20 Patient to wear wound vac for 2 more weeks. 12/09/20 Wound making progress per patient 12/22/20 Wound making progress 01/15/21 Patient reports some wound closure.  Patient recently saw podiatrist.   02/19/21 Wound vac discontinued.  Wound clean and dry.  Home health changing dressing weekly per patient. 03/26/21 Wound to heel completely healed.  Advised to keep pressure off wound.   05/27/21 Patient with new left BKA incision site. Patient reports area with no redness or drainage. Home health involved.  Patient reports being independent with basic ADL's. Daughter helps as needed.  Wound Management Discussed Monitor for swelling, redness, pain, pus, and fever Keep wound clean and dry.   Notify physician immediately for any changes  06/08/21 Patient reports his left BKA site is doing good.  No  redness swelling or drainage. [facility] continues with dressing changes and wound care. Due from a visit this morning per patient. Reviewed wound management. He verbalized understanding.        Monitor and Manage My Blood Sugar-Diabetes Type 2   On track     Barriers: Health Behaviors Knowledge Timeframe:  Long-Range  Goal Priority:  High Start Date:    11/25/20                         Expected End Date:   09/26/21 Follow Up Date 06/26/21 - check blood sugar at prescribed times    Why is this important?   Checking your blood sugar at home helps to keep it from getting very high or very low.  Writing the results in a diary or log helps the doctor know how to care for you.  Your blood sugar log should have the time, date and the results.  Also, write down the amount of insulin or other medicine that you take.  Other information, like what you ate, exercise done and how you were feeling, will also be helpful.     Notes: Patient currently not checking encouraged to check. 12/03/20 Patient not checking sugars currently. Encouraged patient to check sugars. 12/09/20 Patient checked blood sugar it was 223.   12/22/20  PLEASE CHECK YOUR BLOOD SUGARS. 01/15/21 Please check your sugars.   02/17/21 Not checking sugars at home.  "Hard to bleed"  02/19/21 Not checking sugars. He states he does not bleed.  Discussed methods to promote bleeding. 03/26/21 Patient reports checking sugars now.  Encouraged to continue.  Blood sugar this am was 190.   Diabetes Management Discussed: Medication adherence Reviewed importance of limiting carbs such as rice, potatoes, breads, and pastas. Also discussed limiting sweets and sugary drinks.  Discussed importance of portion control.  Also discussed importance of annual exams, foot exams, and eye exams.   05/27/21 Patient reports last sugar check was 202.  Reviewed diabetes management and importance. Patient checks sugars at least weekly.  Diabetes Management  Discussed: Medication adherence Reviewed importance of limiting carbs such as rice, potatoes, breads, and pastas. Also discussed limiting sweets and sugary drinks.  Discussed importance of portion control.  Also discussed importance of annual exams, foot exams, and eye exams.   06/08/21 Patient reports sugar this am was in the 200's. He could not remember exact number but states he had already eaten. Discussed checking sugar prior to eating in the morning. He verbalized understanding.  Reviewed diabetes management and importance for wound healing.  He verbalized understanding.          Plan:  Follow-up: Patient agrees to Care Plan and Follow-up. Follow-up in 2-3 week(s)  [name], RN, MSN [facility] Care Management Coordinator Direct Line [phone] Cell [phone] Toll Free: [phone]  Fax: [phone]

## 2021-06-08 NOTE — Progress Notes (Signed)
Subjective:    Patient ID: [name], male    DOB: 03-23-1951, 70 y.o.   MRN: [id number] Chief Complaint  Patient presents with   Follow-up    2 wk no studies    [name] is a 70 year old male that presents today for follow-up evaluation of his left below-knee amputation.  Previously the patient had all staples removed and Steri-Strips were applied.  The patient reports that he has been working with physical therapy however they have been applying a sticky substance to the Steri-Strips on his stump.  Following their therapy sessions they have to pull this off and it has been bleeding underneath.  Removal of Steri-Strips in the area show area of superficial opening over the lateral incision area.  The patient denies any significant pain but that it does bleed at times.  He denies any foul-smelling or purulent drainage.  The patient does continue on the antibiotics that he was given at his last office visit.  He denies any fevers or chills.  He denies any other issues with his stump.   Review of Systems  Skin:  Positive for wound.  Neurological:  Positive for weakness.  Hematological:  Bruises/bleeds easily.  All other systems reviewed and are negative.     Objective:   Physical Exam Vitals reviewed.  HENT:     Head: Normocephalic.  Cardiovascular:     Rate and Rhythm: Normal rate.  Pulmonary:     Effort: Pulmonary effort is normal.  Musculoskeletal:     Left Lower Extremity: Left leg is amputated below knee.  Skin:    General: Skin is warm and dry.     Findings: Wound present.  Neurological:     Mental Status: He is alert and oriented to person, place, and time.     Motor: Weakness present.     Gait: Gait abnormal.  Psychiatric:        Mood and Affect: Mood normal.        Behavior: Behavior normal.        Thought Content: Thought content normal.        Judgment: Judgment normal.    BP 134/75   Pulse 85   Ht $R'5\' 10"'zh$  (1.778 m)   Wt 141 lb (64 kg)   BMI 20.23  kg/m   Past Medical History:  Diagnosis Date   Cholecystitis, chronic    Cholelithiasis    COPD (chronic obstructive pulmonary disease) (HCC)    Diabetes mellitus without complication (HCC)    Diabetic retinopathy ([place])    Dyspnea    Fatty liver 2008   Fracture of clavicle 1992   left    Fracture of ribs, multiple 1992   3 ribs   GERD (gastroesophageal reflux disease)    Hyperlipidemia    Hypertension    Paroxysmal SVT (supraventricular tachycardia) (HCC)    Pelvis fracture ([place]) 1992   Peripheral vascular disease ([place])    Pleurisy    Pleurisy    Seasonal allergies     Social History   Socioeconomic History   Marital status: Single    Spouse name: Not on file   Number of children: Not on file   Years of education: Not on file   Highest education level: Not on file  Occupational History   Not on file  Tobacco Use   Smoking status: Former    Types: Cigarettes    Quit date: 02/24/1997    Years since quitting: 24.3   Smokeless tobacco: Never  Vaping Use   Vaping Use: Never used  Substance and Sexual Activity   Alcohol use: No   Drug use: No   Sexual activity: Not on file  Other Topics Concern   Not on file  Social History Narrative   Not on file   Social Determinants of Health   Financial Resource Strain: Low Risk    Difficulty of Paying Living Expenses: Not very hard  Food Insecurity: No Food Insecurity   Worried About Running Out of Food in the Last Year: Never true   Ran Out of Food in the Last Year: Never true  Transportation Needs: No Transportation Needs   Lack of Transportation (Medical): No   Lack of Transportation (Non-Medical): No  Physical Activity: Not on file  Stress: Not on file  Social Connections: Not on file  Intimate Partner Violence: Not on file    Past Surgical History:  Procedure Laterality Date   AMPUTATION Left 04/23/2021   Procedure: AMPUTATION BELOW KNEE;  Surgeon: [name], MD;  Location: [facility];  Service: General;   Laterality: Left;   AMPUTATION TOE Left 02/25/2017   Procedure: AMPUTATION TOE-LEFT 2ND MPJ;  Surgeon: Samara Deist, DPM;  Location: [facility];  Service: Podiatry;  Laterality: Left;   AMPUTATION TOE Left 10/20/2018   Procedure: TOE MPJ T2 LEFT;  Surgeon: Samara Deist, DPM;  Location: [facility];  Service: Podiatry;  Laterality: Left;   AMPUTATION TOE Left 01/12/2019   Procedure: AMPUTATION LEFT 5TH TOE AND JOINT;  Surgeon: Samara Deist, DPM;  Location: [facility];  Service: Podiatry;  Laterality: Left;   BACK SURGERY  2008   BONE BIOPSY Left 11/21/2020   Procedure: BONE BIOPSY;  Surgeon: Samara Deist, DPM;  Location: [facility];  Service: Podiatry;  Laterality: Left;   CHOLECYSTECTOMY     COLONOSCOPY WITH PROPOFOL N/A 08/15/2017   Procedure: COLONOSCOPY WITH PROPOFOL;  Surgeon: Lollie Sails, MD;  Location: [facility];  Service: Endoscopy;  Laterality: N/A;   ENDARTERECTOMY Right 05/12/2017   Procedure: ENDARTERECTOMY CAROTID;  Surgeon: [name], MD;  Location: [facility];  Service: Vascular;  Laterality: Right;   ENDARTERECTOMY FEMORAL Left 12/01/2018   Procedure: ENDARTERECTOMY FEMORAL;  Surgeon: [name], MD;  Location: [facility];  Service: Vascular;  Laterality: Left;   ERCP     FOOT SURGERY Right    x 3   IRRIGATION AND DEBRIDEMENT FOOT Left 11/21/2020   Procedure: IRRIGATION AND DEBRIDEMENT FOOT--with placement of wound vac;  Surgeon: Samara Deist, DPM;  Location: [facility];  Service: Podiatry;  Laterality: Left;   LOWER EXTREMITY ANGIOGRAM Left 12/01/2018   Procedure: LOWER EXTREMITY ANGIOGRAM;  Surgeon: [name], MD;  Location: [facility];  Service: Vascular;  Laterality: Left;   LOWER EXTREMITY ANGIOGRAPHY Left 10/30/2018   Procedure: LOWER EXTREMITY ANGIOGRAPHY;  Surgeon: [name], MD;  Location: [place] [facility];  Service: Cardiovascular;  Laterality: Left;   LOWER EXTREMITY ANGIOGRAPHY Left 11/30/2018   Procedure: LOWER EXTREMITY ANGIOGRAPHY;  Surgeon: [name],  MD;  Location: [place] [facility];  Service: Cardiovascular;  Laterality: Left;   LOWER EXTREMITY ANGIOGRAPHY Left 11/17/2020   Procedure: Lower Extremity Angiography;  Surgeon: [name], MD;  Location: [place] [facility];  Service: Cardiovascular;  Laterality: Left;   LOWER EXTREMITY ANGIOGRAPHY Left 04/22/2021   Procedure: Lower Extremity Angiography;  Surgeon: [name], MD;  Location: [place] [facility];  Service: Cardiovascular;  Laterality: Left;   TEE WITHOUT CARDIOVERSION N/A  11/14/2020   Procedure: TRANSESOPHAGEAL ECHOCARDIOGRAM (TEE);  Surgeon: Corey Skains, MD;  Location: [facility];  Service: Cardiovascular;  Laterality: N/A;    Family History  Problem Relation Age of Onset   Diabetes Sister     Allergies  Allergen Reactions   Penicillins Rash and Other (See Comments)    Rash in and around the mouth Has patient had a PCN reaction causing immediate rash, facial/tongue/throat swelling, SOB or lightheadedness with hypotension: Yes Has patient had a PCN reaction causing severe rash involving mucus membranes or skin necrosis: Yes Has patient had a PCN reaction that required hospitalization: No Has patient had a PCN reaction occurring within the last 10 years: Yes If all of the above answers are "NO", then may proceed with Cephalosporin use.    Bactrim [Sulfamethoxazole-Trimethoprim] Other (See Comments)    Mouth sores, Sores develop in mouth    CBC Latest Ref Rng & Units 04/26/2021 04/25/2021 04/25/2021  WBC 4.0 - 10.5 K/uL 4.6 - 3.6(L)  Hemoglobin 13.0 - 17.0 g/dL 9.0(L) 8.8(L) 7.3(L)  Hematocrit 39.0 - 52.0 % 28.0(L) 26.9(L) 23.1(L)  Platelets 150 - 400 K/uL 210 - 195      CMP     Component Value Date/Time   NA 135 04/26/2021 0559   NA 136 04/17/2014 1026   K 4.8 04/26/2021 0559   K 5.1 04/17/2014 1026   CL 103 04/26/2021 0559   CL 103 04/17/2014 1026   CO2 25 04/26/2021 0559   CO2 28 04/17/2014 1026   GLUCOSE 194 (H) 04/26/2021 0559   GLUCOSE 124  (H) 04/17/2014 1026   BUN 36 (H) 04/26/2021 0559   BUN 28 (H) 04/17/2014 1026   CREATININE 0.99 04/28/2021 0513   CREATININE 1.30 04/17/2014 1026   CALCIUM 8.4 (L) 04/26/2021 0559   CALCIUM 8.6 04/17/2014 1026   PROT 7.1 04/20/2021 1409   PROT 7.1 04/17/2014 1026   ALBUMIN 2.7 (L) 04/20/2021 1409   ALBUMIN 3.8 04/17/2014 1026   AST 17 04/20/2021 1409   AST 26 04/17/2014 1026   ALT 14 04/20/2021 1409   ALT 36 04/17/2014 1026   ALKPHOS 76 04/20/2021 1409   ALKPHOS 50 04/17/2014 1026   BILITOT 0.6 04/20/2021 1409   BILITOT 1.5 (H) 04/17/2014 1026   GFRNONAA >60 04/28/2021 0513   GFRNONAA 59 (L) 04/17/2014 1026   GFRAA >60 06/20/2020 1001   GFRAA >60 04/17/2014 1026     No results found.     Assessment & Plan:   1. Left below-knee amputee [facility]) The patient has some superficial erosion of the skin on the lateral distal portion of his stump.  I am hopeful that because this is superficial it will heal quickly with use of Aquacel.  Advised the patient to change this 2-3 times weekly.  We will also contact the patient's home health agency to increase his home visits.  We will have him return in 3 weeks for wound evaluation.     Current Outpatient Medications on File Prior to Visit  Medication Sig Dispense Refill   acetaminophen (TYLENOL) 325 MG tablet Take 650 mg by mouth every 6 (six) hours as needed for moderate pain.     aspirin EC 81 MG tablet Take 81 mg by mouth daily.     Blood Glucose Calibration (TRUE METRIX LEVEL 1) Low SOLN Use as directed when needed dx E11.65 1 each 0   Blood Glucose Monitoring Suppl (TRUE METRIX GO GLUCOSE METER) w/Device KIT Use as directed to check glucose DX  e11.65 1 kit 0   Calcium Carbonate-Vitamin D (CALCIUM PLUS VITAMIN D PO) Take 1 tablet by mouth daily.     clopidogrel (PLAVIX) 75 MG tablet Take 1 tablet (75 mg total) by mouth daily. 90 tablet 3   Continuous Blood Gluc Receiver (DEXCOM G6 RECEIVER) DEVI Use as directed every 14 days to monitor  blood sugars 3 each 3   Continuous Blood Gluc Sensor (DEXCOM G6 SENSOR) MISC Use as directed every 14 days to monitor blood sugars 3 each 3   Continuous Blood Gluc Transmit (DEXCOM G6 TRANSMITTER) MISC Use as directed every 14 days to monitor blood sugars 3 each 3   cyclobenzaprine (FLEXERIL) 10 MG tablet Take 1 tablet (10 mg total) by mouth 3 (three) times daily as needed for muscle spasms. 270 tablet 1   diphenhydrAMINE (BENADRYL) 25 mg capsule Take 25 mg by mouth every 6 (six) hours as needed for allergies.     doxycycline (VIBRAMYCIN) 100 MG capsule Take 1 capsule (100 mg total) by mouth 2 (two) times daily. 28 capsule 0   gabapentin (NEURONTIN) 300 MG capsule Take 1 capsule (300 mg total) by mouth 3 (three) times daily. 90 capsule 1   glucose blood (TRUE METRIX BLOOD GLUCOSE TEST) test strip Use as directed twice dailyDx E11.65 100 each 1   glyBURIDE (DIABETA) 5 MG tablet TAKE 1 TABLET TWICE DAILY WITH MEALS 180 tablet 1   Lancets (ACCU-CHEK SOFT TOUCH) lancets Use as instructed 100 each 12   lovastatin (MEVACOR) 10 MG tablet Take 1 tab  Po QHS 90 tablet 1   meloxicam (MOBIC) 15 MG tablet Take 15 mg by mouth daily.     metoprolol succinate (TOPROL-XL) 50 MG 24 hr tablet Take 1 tablet (50 mg total) by mouth daily. Take with or immediately following a meal. 90 tablet 1   mometasone-formoterol (DULERA) 100-5 MCG/ACT AERO Inhale 2 puffs into the lungs 2 (two) times daily as needed for wheezing. 1 each 3   simethicone (MYLICON) 80 MG chewable tablet Chew 1 tablet (80 mg total) by mouth 4 (four) times daily. 30 tablet 0   TRUEplus Lancets 30G MISC by Does not apply route. Use as directed twice a daily Dx E11.65     No current facility-administered medications on file prior to visit.    There are no Patient Instructions on file for this visit. No follow-ups on file.   Kris Hartmann, NP

## 2021-06-08 NOTE — Patient Instructions (Signed)
Goals Addressed               This Visit's Progress     Careful Skin Care-incision to left BKA site (pt-stated)   On track     Barriers: Knowledge Timeframe:  Short-Term Goal Priority:  High Start Date:   11/25/20                          Expected End Date:  07/27/21           Follow Up Date  06/26/21  - clean and dry skin well  -keep wound vac intact -monitor for signs such as redness, swelling or pain to site   Why is this important?    Taking really good care of your skin will help to keep your skin unbroken.    Notes: 11/25/20 Patient reports wound vac intact with no redness or swelling.  12/03/20 Patient to wear wound vac for 2 more weeks. 12/09/20 Wound making progress per patient 12/22/20 Wound making progress 01/15/21 Patient reports some wound closure.  Patient recently saw podiatrist.   02/19/21 Wound vac discontinued.  Wound clean and dry.  Home health changing dressing weekly per patient. 03/26/21 Wound to heel completely healed.  Advised to keep pressure off wound.   05/27/21 Patient with new left BKA incision site. Patient reports area with no redness or drainage. Home health involved.  Patient reports being independent with basic ADL's. Daughter helps as needed.  Wound Management Discussed Monitor for swelling, redness, pain, pus, and fever Keep wound clean and dry.   Notify physician immediately for any changes  06/08/21 Patient reports his left BKA site is doing good.  No redness swelling or drainage. [facility] continues with dressing changes and wound care. Due from a visit this morning per patient. Reviewed wound management. He verbalized understanding.        Monitor and Manage My Blood Sugar-Diabetes Type 2   On track     Barriers: Health Behaviors Knowledge Timeframe:  Long-Range Goal Priority:  High Start Date:    11/25/20                         Expected End Date:   09/26/21 Follow Up Date 06/26/21 - check blood sugar at prescribed times    Why is this  important?   Checking your blood sugar at home helps to keep it from getting very high or very low.  Writing the results in a diary or log helps the doctor know how to care for you.  Your blood sugar log should have the time, date and the results.  Also, write down the amount of insulin or other medicine that you take.  Other information, like what you ate, exercise done and how you were feeling, will also be helpful.     Notes: Patient currently not checking encouraged to check. 12/03/20 Patient not checking sugars currently. Encouraged patient to check sugars. 12/09/20 Patient checked blood sugar it was 223.   12/22/20  PLEASE CHECK YOUR BLOOD SUGARS. 01/15/21 Please check your sugars.   02/17/21 Not checking sugars at home.  "Hard to bleed"  02/19/21 Not checking sugars. He states he does not bleed.  Discussed methods to promote bleeding. 03/26/21 Patient reports checking sugars now.  Encouraged to continue.  Blood sugar this am was 190.   Diabetes Management Discussed: Medication adherence Reviewed importance of limiting carbs such as rice, potatoes, breads,  and pastas. Also discussed limiting sweets and sugary drinks.  Discussed importance of portion control.  Also discussed importance of annual exams, foot exams, and eye exams.   05/27/21 Patient reports last sugar check was 202.  Reviewed diabetes management and importance. Patient checks sugars at least weekly.  Diabetes Management Discussed: Medication adherence Reviewed importance of limiting carbs such as rice, potatoes, breads, and pastas. Also discussed limiting sweets and sugary drinks.  Discussed importance of portion control.  Also discussed importance of annual exams, foot exams, and eye exams.   06/08/21 Patient reports sugar this am was in the 200's. He could not remember exact number but states he had already eaten. Discussed checking sugar prior to eating in the morning. He verbalized understanding.  Reviewed diabetes management and  importance for wound healing.  He verbalized understanding.

## 2021-06-10 DIAGNOSIS — I471 Supraventricular tachycardia: Secondary | ICD-10-CM | POA: Diagnosis not present

## 2021-06-10 DIAGNOSIS — E11319 Type 2 diabetes mellitus with unspecified diabetic retinopathy without macular edema: Secondary | ICD-10-CM | POA: Diagnosis not present

## 2021-06-10 DIAGNOSIS — J449 Chronic obstructive pulmonary disease, unspecified: Secondary | ICD-10-CM | POA: Diagnosis not present

## 2021-06-10 DIAGNOSIS — M86172 Other acute osteomyelitis, left ankle and foot: Secondary | ICD-10-CM | POA: Diagnosis not present

## 2021-06-10 DIAGNOSIS — D649 Anemia, unspecified: Secondary | ICD-10-CM | POA: Diagnosis not present

## 2021-06-10 DIAGNOSIS — Z4781 Encounter for orthopedic aftercare following surgical amputation: Secondary | ICD-10-CM | POA: Diagnosis not present

## 2021-06-10 DIAGNOSIS — K811 Chronic cholecystitis: Secondary | ICD-10-CM | POA: Diagnosis not present

## 2021-06-10 DIAGNOSIS — E1169 Type 2 diabetes mellitus with other specified complication: Secondary | ICD-10-CM | POA: Diagnosis not present

## 2021-06-10 DIAGNOSIS — I10 Essential (primary) hypertension: Secondary | ICD-10-CM | POA: Diagnosis not present

## 2021-06-11 ENCOUNTER — Other Ambulatory Visit: Payer: Self-pay | Admitting: Internal Medicine

## 2021-06-11 ENCOUNTER — Other Ambulatory Visit: Payer: Self-pay

## 2021-06-11 DIAGNOSIS — G8929 Other chronic pain: Secondary | ICD-10-CM

## 2021-06-11 DIAGNOSIS — K811 Chronic cholecystitis: Secondary | ICD-10-CM | POA: Diagnosis not present

## 2021-06-11 DIAGNOSIS — E11319 Type 2 diabetes mellitus with unspecified diabetic retinopathy without macular edema: Secondary | ICD-10-CM | POA: Diagnosis not present

## 2021-06-11 DIAGNOSIS — E1169 Type 2 diabetes mellitus with other specified complication: Secondary | ICD-10-CM | POA: Diagnosis not present

## 2021-06-11 DIAGNOSIS — M542 Cervicalgia: Secondary | ICD-10-CM

## 2021-06-11 DIAGNOSIS — M86172 Other acute osteomyelitis, left ankle and foot: Secondary | ICD-10-CM | POA: Diagnosis not present

## 2021-06-11 DIAGNOSIS — J449 Chronic obstructive pulmonary disease, unspecified: Secondary | ICD-10-CM | POA: Diagnosis not present

## 2021-06-11 DIAGNOSIS — D649 Anemia, unspecified: Secondary | ICD-10-CM | POA: Diagnosis not present

## 2021-06-11 DIAGNOSIS — I10 Essential (primary) hypertension: Secondary | ICD-10-CM | POA: Diagnosis not present

## 2021-06-11 DIAGNOSIS — Z4781 Encounter for orthopedic aftercare following surgical amputation: Secondary | ICD-10-CM | POA: Diagnosis not present

## 2021-06-11 DIAGNOSIS — I471 Supraventricular tachycardia: Secondary | ICD-10-CM | POA: Diagnosis not present

## 2021-06-11 MED ORDER — GABAPENTIN 300 MG PO CAPS: 300.0000 mg | ORAL_CAPSULE | Freq: Three times a day (TID) | ORAL | 1 refills | Status: DC

## 2021-06-12 DIAGNOSIS — Z4781 Encounter for orthopedic aftercare following surgical amputation: Secondary | ICD-10-CM | POA: Diagnosis not present

## 2021-06-12 DIAGNOSIS — E1169 Type 2 diabetes mellitus with other specified complication: Secondary | ICD-10-CM | POA: Diagnosis not present

## 2021-06-12 DIAGNOSIS — E11319 Type 2 diabetes mellitus with unspecified diabetic retinopathy without macular edema: Secondary | ICD-10-CM | POA: Diagnosis not present

## 2021-06-12 DIAGNOSIS — I471 Supraventricular tachycardia: Secondary | ICD-10-CM | POA: Diagnosis not present

## 2021-06-12 DIAGNOSIS — D649 Anemia, unspecified: Secondary | ICD-10-CM | POA: Diagnosis not present

## 2021-06-12 DIAGNOSIS — J449 Chronic obstructive pulmonary disease, unspecified: Secondary | ICD-10-CM | POA: Diagnosis not present

## 2021-06-12 DIAGNOSIS — M86172 Other acute osteomyelitis, left ankle and foot: Secondary | ICD-10-CM | POA: Diagnosis not present

## 2021-06-12 DIAGNOSIS — I10 Essential (primary) hypertension: Secondary | ICD-10-CM | POA: Diagnosis not present

## 2021-06-12 DIAGNOSIS — K811 Chronic cholecystitis: Secondary | ICD-10-CM | POA: Diagnosis not present

## 2021-06-15 DIAGNOSIS — I471 Supraventricular tachycardia: Secondary | ICD-10-CM | POA: Diagnosis not present

## 2021-06-15 DIAGNOSIS — E11319 Type 2 diabetes mellitus with unspecified diabetic retinopathy without macular edema: Secondary | ICD-10-CM | POA: Diagnosis not present

## 2021-06-15 DIAGNOSIS — D649 Anemia, unspecified: Secondary | ICD-10-CM | POA: Diagnosis not present

## 2021-06-15 DIAGNOSIS — Z4781 Encounter for orthopedic aftercare following surgical amputation: Secondary | ICD-10-CM | POA: Diagnosis not present

## 2021-06-15 DIAGNOSIS — I10 Essential (primary) hypertension: Secondary | ICD-10-CM | POA: Diagnosis not present

## 2021-06-15 DIAGNOSIS — J449 Chronic obstructive pulmonary disease, unspecified: Secondary | ICD-10-CM | POA: Diagnosis not present

## 2021-06-15 DIAGNOSIS — M86172 Other acute osteomyelitis, left ankle and foot: Secondary | ICD-10-CM | POA: Diagnosis not present

## 2021-06-15 DIAGNOSIS — E1169 Type 2 diabetes mellitus with other specified complication: Secondary | ICD-10-CM | POA: Diagnosis not present

## 2021-06-15 DIAGNOSIS — K811 Chronic cholecystitis: Secondary | ICD-10-CM | POA: Diagnosis not present

## 2021-06-16 DIAGNOSIS — E1169 Type 2 diabetes mellitus with other specified complication: Secondary | ICD-10-CM | POA: Diagnosis not present

## 2021-06-16 DIAGNOSIS — J449 Chronic obstructive pulmonary disease, unspecified: Secondary | ICD-10-CM | POA: Diagnosis not present

## 2021-06-16 DIAGNOSIS — I10 Essential (primary) hypertension: Secondary | ICD-10-CM | POA: Diagnosis not present

## 2021-06-16 DIAGNOSIS — Z4781 Encounter for orthopedic aftercare following surgical amputation: Secondary | ICD-10-CM | POA: Diagnosis not present

## 2021-06-16 DIAGNOSIS — K811 Chronic cholecystitis: Secondary | ICD-10-CM | POA: Diagnosis not present

## 2021-06-16 DIAGNOSIS — I471 Supraventricular tachycardia: Secondary | ICD-10-CM | POA: Diagnosis not present

## 2021-06-16 DIAGNOSIS — D649 Anemia, unspecified: Secondary | ICD-10-CM | POA: Diagnosis not present

## 2021-06-16 DIAGNOSIS — E11319 Type 2 diabetes mellitus with unspecified diabetic retinopathy without macular edema: Secondary | ICD-10-CM | POA: Diagnosis not present

## 2021-06-16 DIAGNOSIS — M86172 Other acute osteomyelitis, left ankle and foot: Secondary | ICD-10-CM | POA: Diagnosis not present

## 2021-06-17 ENCOUNTER — Telehealth (INDEPENDENT_AMBULATORY_CARE_PROVIDER_SITE_OTHER): Payer: Self-pay

## 2021-06-17 ENCOUNTER — Other Ambulatory Visit: Payer: Self-pay | Admitting: Internal Medicine

## 2021-06-17 NOTE — Telephone Encounter (Signed)
Patient daughter left a message requesting prescription for a new wheelchair. Wheelchair prescription has been faxed over to [facility] and patient daughter was made aware.

## 2021-06-18 DIAGNOSIS — M86172 Other acute osteomyelitis, left ankle and foot: Secondary | ICD-10-CM | POA: Diagnosis not present

## 2021-06-18 DIAGNOSIS — E1169 Type 2 diabetes mellitus with other specified complication: Secondary | ICD-10-CM | POA: Diagnosis not present

## 2021-06-18 DIAGNOSIS — I471 Supraventricular tachycardia: Secondary | ICD-10-CM | POA: Diagnosis not present

## 2021-06-18 DIAGNOSIS — E11319 Type 2 diabetes mellitus with unspecified diabetic retinopathy without macular edema: Secondary | ICD-10-CM | POA: Diagnosis not present

## 2021-06-18 DIAGNOSIS — J449 Chronic obstructive pulmonary disease, unspecified: Secondary | ICD-10-CM | POA: Diagnosis not present

## 2021-06-18 DIAGNOSIS — I10 Essential (primary) hypertension: Secondary | ICD-10-CM | POA: Diagnosis not present

## 2021-06-18 DIAGNOSIS — K811 Chronic cholecystitis: Secondary | ICD-10-CM | POA: Diagnosis not present

## 2021-06-18 DIAGNOSIS — Z4781 Encounter for orthopedic aftercare following surgical amputation: Secondary | ICD-10-CM | POA: Diagnosis not present

## 2021-06-18 DIAGNOSIS — D649 Anemia, unspecified: Secondary | ICD-10-CM | POA: Diagnosis not present

## 2021-06-22 ENCOUNTER — Telehealth: Payer: Self-pay

## 2021-06-22 ENCOUNTER — Encounter: Payer: Self-pay | Admitting: Physician Assistant

## 2021-06-22 ENCOUNTER — Ambulatory Visit (INDEPENDENT_AMBULATORY_CARE_PROVIDER_SITE_OTHER): Payer: Medicare HMO | Admitting: Physician Assistant

## 2021-06-22 ENCOUNTER — Other Ambulatory Visit: Payer: Self-pay

## 2021-06-22 DIAGNOSIS — R0902 Hypoxemia: Secondary | ICD-10-CM | POA: Diagnosis not present

## 2021-06-22 DIAGNOSIS — Z89512 Acquired absence of left leg below knee: Secondary | ICD-10-CM

## 2021-06-22 DIAGNOSIS — Z0001 Encounter for general adult medical examination with abnormal findings: Secondary | ICD-10-CM

## 2021-06-22 DIAGNOSIS — E11319 Type 2 diabetes mellitus with unspecified diabetic retinopathy without macular edema: Secondary | ICD-10-CM | POA: Diagnosis not present

## 2021-06-22 DIAGNOSIS — J449 Chronic obstructive pulmonary disease, unspecified: Secondary | ICD-10-CM

## 2021-06-22 DIAGNOSIS — E1165 Type 2 diabetes mellitus with hyperglycemia: Secondary | ICD-10-CM

## 2021-06-22 DIAGNOSIS — R300 Dysuria: Secondary | ICD-10-CM

## 2021-06-22 DIAGNOSIS — I471 Supraventricular tachycardia: Secondary | ICD-10-CM | POA: Diagnosis not present

## 2021-06-22 DIAGNOSIS — I10 Essential (primary) hypertension: Secondary | ICD-10-CM

## 2021-06-22 DIAGNOSIS — E782 Mixed hyperlipidemia: Secondary | ICD-10-CM

## 2021-06-22 DIAGNOSIS — R319 Hematuria, unspecified: Secondary | ICD-10-CM | POA: Diagnosis not present

## 2021-06-22 DIAGNOSIS — M86172 Other acute osteomyelitis, left ankle and foot: Secondary | ICD-10-CM | POA: Diagnosis not present

## 2021-06-22 DIAGNOSIS — K811 Chronic cholecystitis: Secondary | ICD-10-CM | POA: Diagnosis not present

## 2021-06-22 DIAGNOSIS — N390 Urinary tract infection, site not specified: Secondary | ICD-10-CM

## 2021-06-22 DIAGNOSIS — D649 Anemia, unspecified: Secondary | ICD-10-CM | POA: Diagnosis not present

## 2021-06-22 DIAGNOSIS — E1169 Type 2 diabetes mellitus with other specified complication: Secondary | ICD-10-CM | POA: Diagnosis not present

## 2021-06-22 DIAGNOSIS — Z4781 Encounter for orthopedic aftercare following surgical amputation: Secondary | ICD-10-CM | POA: Diagnosis not present

## 2021-06-22 LAB — POCT URINALYSIS DIPSTICK
Bilirubin, UA: NEGATIVE
Glucose, UA: NEGATIVE
Ketones, UA: NEGATIVE
Nitrite, UA: NEGATIVE
Protein, UA: POSITIVE — AB
Spec Grav, UA: <=1.005 — AB (ref 1.010–1.025)
Urobilinogen, UA: 2 E.U./dL — AB
pH, UA: 8.5 — AB (ref 5.0–8.0)

## 2021-06-22 LAB — POCT GLYCOSYLATED HEMOGLOBIN (HGB A1C): Hemoglobin A1C: 6.9 % — AB (ref 4.0–5.6)

## 2021-06-22 MED ORDER — NITROFURANTOIN MONOHYD MACRO 100 MG PO CAPS: ORAL_CAPSULE | ORAL | 0 refills | Status: DC

## 2021-06-22 NOTE — Telephone Encounter (Signed)
Lmom to cal [place] back regarding urine test result that we are sending him antibiotic is called Macrobid

## 2021-06-22 NOTE — Progress Notes (Signed)
[facility], [place] [address]  Internal MEDICINE  Office Visit Note  Patient Name: [name]  [id number]  [id number]  Date of Service: 06/23/2021  Chief Complaint  Patient presents with   Medicare Wellness   Hypertension   Diabetes   Hyperlipidemia   Urinary Tract Infection     HPI Pt is here for routine health maintenance examination -LLE amputation at end of July due to osteomyelitis. He had been followed by podiatry for ulcer prior to this and was sent to ED by podiatry for concern of infection in office, which then led to amputation. He is followed by vascular now and has follow up later this week. He has not yet been cleared to be fitted for prosthesis. Patient has been in good spirits per daughter who is with him today. -Nurse through [employer], she comes today and again wed/thurs, she is changing bandages currently -PT at home twice per week, was doing OT but he finished this -sleep has not been great, but is the same as usual and is wearing oxygen at night now. Used to working third shift and is on alterred sleep schedule. -BG in variable 130-200 -breathing has been ok -Trying to find cheaper inhaler, will message pharmacist to follow up on this. States he doesn't answer unknown numbers and may have missed their  attempts to reach out previously. Requests they call his cell [phone] and leave VM if he doesn't answer. -pt states he will ask cardiology about bp meds as his bp has been running lower -burning with urination and increased frequency and thinks he may have a UTI -UTD on colonoscopy and had several labs done in hospital, will need to check thyroid and lipid next visit  Current Medication: Outpatient Encounter Medications as of 06/22/2021  Medication Sig   acetaminophen (TYLENOL) 325 MG tablet Take 650 mg by mouth every 6 (six) hours as needed for moderate pain.   aspirin EC 81 MG tablet Take 81 mg by mouth daily.   Blood  Glucose Calibration (TRUE METRIX LEVEL 1) Low SOLN Use as directed when needed dx E11.65   Blood Glucose Monitoring Suppl (TRUE METRIX GO GLUCOSE METER) w/Device KIT Use as directed to check glucose DX e11.65   Calcium Carbonate-Vitamin D (CALCIUM PLUS VITAMIN D PO) Take 1 tablet by mouth daily.   clopidogrel (PLAVIX) 75 MG tablet Take 1 tablet (75 mg total) by mouth daily.   Continuous Blood Gluc Receiver (DEXCOM G6 RECEIVER) DEVI Use as directed every 14 days to monitor blood sugars   Continuous Blood Gluc Sensor (DEXCOM G6 SENSOR) MISC Use as directed every 14 days to monitor blood sugars   Continuous Blood Gluc Transmit (DEXCOM G6 TRANSMITTER) MISC Use as directed every 14 days to monitor blood sugars   cyclobenzaprine (FLEXERIL) 10 MG tablet Take 1 tablet (10 mg total) by mouth 3 (three) times daily as needed for muscle spasms.   diphenhydrAMINE (BENADRYL) 25 mg capsule Take 25 mg by mouth every 6 (six) hours as needed for allergies.   gabapentin (NEURONTIN) 300 MG capsule Take 1 capsule (300 mg total) by mouth 3 (three) times daily.   glucose blood (TRUE METRIX BLOOD GLUCOSE TEST) test strip TEST BLOOD SUGAR TWICE DAILY AS DIRECTED   glyBURIDE (DIABETA) 5 MG tablet TAKE 1 TABLET TWICE DAILY WITH MEALS   Lancets (ACCU-CHEK SOFT TOUCH) lancets Use as instructed   lovastatin (MEVACOR) 10 MG tablet Take 1 tab  Po QHS   meloxicam (MOBIC)  15 MG tablet Take 15 mg by mouth daily.   metoprolol succinate (TOPROL-XL) 50 MG 24 hr tablet Take 1 tablet (50 mg total) by mouth daily. Take with or immediately following a meal.   mometasone-formoterol (DULERA) 100-5 MCG/ACT AERO Inhale 2 puffs into the lungs 2 (two) times daily as needed for wheezing.   nitrofurantoin, macrocrystal-monohydrate, (MACROBID) 100 MG capsule Take 1 cap twice per day for 10 days.   TRUEplus Lancets 30G MISC by Does not apply route. Use as directed twice a daily Dx E11.65   [DISCONTINUED] doxycycline (VIBRAMYCIN) 100 MG capsule Take  1 capsule (100 mg total) by mouth 2 (two) times daily.   [DISCONTINUED] simethicone (MYLICON) 80 MG chewable tablet Chew 1 tablet (80 mg total) by mouth 4 (four) times daily.   No facility-administered encounter medications on file as of 06/22/2021.    Surgical History: Past Surgical History:  Procedure Laterality Date   AMPUTATION Left 04/23/2021   Procedure: AMPUTATION BELOW KNEE;  Surgeon: Annice Needy, MD;  Location: [facility] OR[facility];  Service: General;  Laterality: Left;   AMPUTATION TOE Left 02/25/2017   Procedure: AMPUTATION TOE-LEFT 2ND MPJ;  Surgeon: Gwyneth Revels, DPM;  Location: [facility];  Service: Podiatry;  Laterality: Left;   AMPUTATION TOE Left 10/20/2018   Procedure: TOE MPJ T2 LEFT;  Surgeon: Gwyneth Revels, DPM;  Location: [facility];  Service: Podiatry;  Laterality: Left;   AMPUTATION TOE Left 01/12/2019   Procedure: AMPUTATION LEFT 5TH TOE AND JOINT;  Surgeon: Gwyneth Revels, DPM;  Location: [facility];  Service: Podiatry;  Laterality: Left;   BACK SURGERY  2008   BONE BIOPSY Left 11/21/2020   Procedure: BONE BIOPSY;  Surgeon: Gwyneth Revels, DPM;  Location: [facility];  Service: Podiatry;  Laterality: Left;   CHOLECYSTECTOMY     COLONOSCOPY WITH PROPOFOL N/A 08/15/2017   Procedure: COLONOSCOPY WITH PROPOFOL;  Surgeon: Christena Deem, MD;  Location: [facility] ENDOSCOPY;  Service: Endoscopy;  Laterality: N/A;   ENDARTERECTOMY Right 05/12/2017   Procedure: ENDARTERECTOMY CAROTID;  Surgeon: Annice Needy, MD;  Location: [facility];  Service: Vascular;  Laterality: Right;   ENDARTERECTOMY FEMORAL Left 12/01/2018   Procedure: ENDARTERECTOMY FEMORAL;  Surgeon: Annice Needy, MD;  Location: [facility];  Service: Vascular;  Laterality: Left;   ERCP     FOOT SURGERY Right    x 3   IRRIGATION AND DEBRIDEMENT FOOT Left 11/21/2020   Procedure: IRRIGATION AND DEBRIDEMENT FOOT--with placement of wound vac;  Surgeon: Gwyneth Revels, DPM;  Location: [facility];  Service: Podiatry;  Laterality: Left;   LOWER  EXTREMITY ANGIOGRAM Left 12/01/2018   Procedure: LOWER EXTREMITY ANGIOGRAM;  Surgeon: Annice Needy, MD;  Location: [facility];  Service: Vascular;  Laterality: Left;   LOWER EXTREMITY ANGIOGRAPHY Left 10/30/2018   Procedure: LOWER EXTREMITY ANGIOGRAPHY;  Surgeon: Annice Needy, MD;  Location: [facility];  Service: Cardiovascular;  Laterality: Left;   LOWER EXTREMITY ANGIOGRAPHY Left 11/30/2018   Procedure: LOWER EXTREMITY ANGIOGRAPHY;  Surgeon: Annice Needy, MD;  Location: [facility];  Service: Cardiovascular;  Laterality: Left;   LOWER EXTREMITY ANGIOGRAPHY Left 11/17/2020   Procedure: Lower Extremity Angiography;  Surgeon: Annice Needy, MD;  Location: [facility];  Service: Cardiovascular;  Laterality: Left;   LOWER EXTREMITY ANGIOGRAPHY Left 04/22/2021   Procedure: Lower Extremity Angiography;  Surgeon: Annice Needy, MD;  Location: [facility];  Service: Cardiovascular;  Laterality: Left;   TEE WITHOUT CARDIOVERSION N/A 11/14/2020  Procedure: TRANSESOPHAGEAL ECHOCARDIOGRAM (TEE);  Surgeon: Corey Skains, MD;  Location: [facility];  Service: Cardiovascular;  Laterality: N/A;    Medical History: Past Medical History:  Diagnosis Date   Cholecystitis, chronic    Cholelithiasis    COPD (chronic obstructive pulmonary disease) ([place])    Diabetes mellitus without complication ([place])    Diabetic retinopathy ([place])    Dyspnea    Fatty liver 2008   Fracture of clavicle 1992   left    Fracture of ribs, multiple 1992   3 ribs   GERD (gastroesophageal reflux disease)    Hyperlipidemia    Hypertension    Paroxysmal SVT (supraventricular tachycardia) ([facility])    Pelvis fracture ([place]) 1992   Peripheral vascular disease ([place])    Pleurisy    Pleurisy    Seasonal allergies     Family History: Family History  Problem Relation Age of Onset   Diabetes Sister       Review of Systems  Constitutional:  Negative for chills, fatigue and unexpected weight change.  HENT:   Negative for congestion, postnasal drip, rhinorrhea, sneezing and sore throat.   Eyes:  Negative for redness.  Respiratory:  Negative for cough, chest tightness, shortness of breath and wheezing.   Cardiovascular:  Negative for chest pain and palpitations.  Gastrointestinal:  Negative for abdominal pain, constipation, diarrhea, nausea and vomiting.  Genitourinary:  Positive for dysuria and frequency.  Musculoskeletal:  Negative for arthralgias, back pain, joint swelling and neck pain.  Skin:  Negative for rash.  Neurological: Negative.  Negative for tremors and numbness.  Hematological:  Negative for adenopathy. Does not bruise/bleed easily.  Psychiatric/Behavioral:  Negative for behavioral problems (Depression), sleep disturbance and suicidal ideas. The patient is not nervous/anxious.     Vital Signs: BP 126/83   Pulse 83   Temp 97.8 F (36.6 C)   Resp 16   Ht $R'5\' 10"'lm$  (1.778 m)   Wt 141 lb (64 kg)   SpO2 98%   BMI 20.23 kg/m    Physical Exam Vitals and nursing note reviewed.  Constitutional:      General: He is not in acute distress.    Appearance: He is well-developed and normal weight. He is not diaphoretic.  HENT:     Head: Normocephalic and atraumatic.     Right Ear: External ear normal.     Left Ear: External ear normal.     Nose: Nose normal.     Mouth/Throat:     Pharynx: No oropharyngeal exudate.  Eyes:     General: No scleral icterus.       Right eye: No discharge.        Left eye: No discharge.     Conjunctiva/sclera: Conjunctivae normal.     Pupils: Pupils are equal, round, and reactive to light.  Neck:     Thyroid: No thyromegaly.     Vascular: No JVD.     Trachea: No tracheal deviation.  Cardiovascular:     Rate and Rhythm: Normal rate and regular rhythm.     Heart sounds: Normal heart sounds. No murmur heard.   No friction rub. No gallop.  Pulmonary:     Effort: Pulmonary effort is normal. No respiratory distress.     Breath sounds: Normal breath  sounds. No stridor. No wheezing or rales.  Chest:     Chest wall: No tenderness.  Abdominal:     General: Bowel sounds are normal. There is no distension.     Palpations: Abdomen  is soft. There is no mass.     Tenderness: There is no abdominal tenderness. There is no guarding or rebound.  Musculoskeletal:        General: No tenderness or deformity. Normal range of motion.     Cervical back: Normal range of motion and neck supple.  Lymphadenopathy:     Cervical: No cervical adenopathy.  Skin:    General: Skin is warm and dry.     Coloration: Skin is not pale.     Findings: No erythema or rash.     Comments: LLE amputation below the knee, currently bandaged  Neurological:     Mental Status: He is alert.     Cranial Nerves: No cranial nerve deficit.     Motor: No abnormal muscle tone.     Coordination: Coordination normal.     Gait: Gait abnormal.     Deep Tendon Reflexes: Reflexes are normal and symmetric.  Psychiatric:        Behavior: Behavior normal.        Thought Content: Thought content normal.        Judgment: Judgment normal.     LABS: Recent Results (from the past 2160 hour(s))  Lactic acid, plasma     Status: Abnormal   Collection Time: 04/20/21  2:09 PM  Result Value Ref Range   Lactic Acid, Venous 2.2 (HH) 0.5 - 1.9 mmol/L    Comment: CRITICAL RESULT CALLED TO, READ BACK BY AND VERIFIED WITH STEPHANIE RUDD $RemoveBefore'@1443'[id number]$  04/20/21 MJU Performed at [facility], [address] [place], [place] [address]   Comprehensive metabolic panel     Status: Abnormal   Collection Time: 04/20/21  2:09 PM  Result Value Ref Range   Sodium 130 (L) 135 - 145 mmol/L   Potassium 4.9 3.5 - 5.1 mmol/L   Chloride 96 (L) 98 - 111 mmol/L   CO2 25 22 - 32 mmol/L   Glucose, Bld 343 (H) 70 - 99 mg/dL    Comment: Glucose reference range applies only to samples taken after fasting for at least 8 hours.   BUN 21 8 - 23 mg/dL   Creatinine, Ser 1.04 0.61 - 1.24 mg/dL   Calcium 8.6 (L)  8.9 - 10.3 mg/dL   Total Protein 7.1 6.5 - 8.1 g/dL   Albumin 2.7 (L) 3.5 - 5.0 g/dL   AST 17 15 - 41 U/L   ALT 14 0 - 44 U/L   Alkaline Phosphatase 76 38 - 126 U/L   Total Bilirubin 0.6 0.3 - 1.2 mg/dL   GFR, Estimated >60 >60 mL/min    Comment: (NOTE) Calculated using the CKD-EPI Creatinine Equation (2021)    Anion gap 9 5 - 15    Comment: Performed at [facility], [address] [place], [place] [address]  CBC with Differential     Status: Abnormal   Collection Time: 04/20/21  2:09 PM  Result Value Ref Range   WBC 7.5 4.0 - 10.5 K/uL   RBC 3.70 (L) 4.22 - 5.81 MIL/uL   Hemoglobin 9.6 (L) 13.0 - 17.0 g/dL   HCT 29.6 (L) 39.0 - 52.0 %   MCV 80.0 80.0 - 100.0 fL   MCH 25.9 (L) 26.0 - 34.0 pg   MCHC 32.4 30.0 - 36.0 g/dL   RDW 15.2 11.5 - 15.5 %   Platelets 232 150 - 400 K/uL   nRBC 0.0 0.0 - 0.2 %   Neutrophils Relative % 79 %   Neutro Abs 5.8  1.7 - 7.7 K/uL   Lymphocytes Relative 14 %   Lymphs Abs 1.0 0.7 - 4.0 K/uL   Monocytes Relative 6 %   Monocytes Absolute 0.5 0.1 - 1.0 K/uL   Eosinophils Relative 1 %   Eosinophils Absolute 0.1 0.0 - 0.5 K/uL   Basophils Relative 0 %   Basophils Absolute 0.0 0.0 - 0.1 K/uL   Immature Granulocytes 0 %   Abs Immature Granulocytes 0.02 0.00 - 0.07 K/uL    Comment: Performed at [facility], [address] [place], [place] [address]  Hemoglobin A1c     Status: Abnormal   Collection Time: 04/20/21  2:09 PM  Result Value Ref Range   Hgb A1c MFr Bld 8.3 (H) 4.8 - 5.6 %    Comment: (NOTE)         Prediabetes: 5.7 - 6.4         Diabetes: >6.4         Glycemic control for adults with diabetes: <7.0    Mean Plasma Glucose 192 mg/dL    Comment: (NOTE) Performed At: [name] [address] [place], [place] [id number] Rush Farmer MD PR:[id number]   Lactic acid, plasma     Status: None   Collection Time: 04/20/21  9:30 PM  Result Value Ref Range   Lactic Acid, Venous 1.6 0.5 - 1.9 mmol/L     Comment: Performed at [facility], [address], [place], [place] [address]  Resp Panel by RT-PCR (Flu A&B, Covid) Nasopharyngeal Swab     Status: None   Collection Time: 04/20/21 10:38 PM   Specimen: Nasopharyngeal Swab; Nasopharyngeal(NP) swabs in vial transport medium  Result Value Ref Range   SARS Coronavirus 2 by RT PCR NEGATIVE NEGATIVE    Comment: (NOTE) SARS-CoV-2 target nucleic acids are NOT DETECTED.  The SARS-CoV-2 RNA is generally detectable in upper respiratory specimens during the acute phase of infection. The lowest concentration of SARS-CoV-2 viral copies this assay can detect is 138 copies/mL. A negative result does not preclude SARS-Cov-2 infection and should not be used as the sole basis for treatment or other patient management decisions. A negative result may occur with  improper specimen collection/handling, submission of specimen other than nasopharyngeal swab, presence of viral mutation(s) within the areas targeted by this assay, and inadequate number of viral copies(<138 copies/mL). A negative result must be combined with clinical observations, patient history, and epidemiological information. The expected result is Negative.  Fact Sheet for Patients:  EntrepreneurPulse.com.au  Fact Sheet for Healthcare Providers:  IncredibleEmployment.be  This test is no t yet approved or cleared by the [place] FDA and  has been authorized for detection and/or diagnosis of SARS-CoV-2 by FDA under an Emergency Use Authorization (EUA). This EUA will remain  in effect (meaning this test can be used) for the duration of the COVID-19 declaration under Section 564(b)(1) of the Act, 21 U.S.C.section 360bbb-3(b)(1), unless the authorization is terminated  or revoked sooner.       Influenza A by PCR NEGATIVE NEGATIVE   Influenza B by PCR NEGATIVE NEGATIVE    Comment: (NOTE) The Xpert Xpress SARS-CoV-2/FLU/RSV plus assay is  intended as an aid in the diagnosis of influenza from Nasopharyngeal swab specimens and should not be used as a sole basis for treatment. Nasal washings and aspirates are unacceptable for Xpert Xpress SARS-CoV-2/FLU/RSV testing.  Fact Sheet for Patients: EntrepreneurPulse.com.au  Fact Sheet for Healthcare Providers: IncredibleEmployment.be  This test is not yet approved or cleared by the  [place] Architectural technologist and has been authorized for detection and/or diagnosis of SARS-CoV-2 by FDA under an Print production planner (EUA). This EUA will remain in effect (meaning this test can be used) for the duration of the COVID-19 declaration under Section 564(b)(1) of the Act, 21 U.S.C. section 360bbb-3(b)(1), unless the authorization is terminated or revoked.  Performed at [facility], [address] [place], [place] [address]   CBG monitoring, ED     Status: Abnormal   Collection Time: 04/21/21 12:07 AM  Result Value Ref Range   Glucose-Capillary 245 (H) 70 - 99 mg/dL    Comment: Glucose reference range applies only to samples taken after fasting for at least 8 hours.  CBG monitoring, ED     Status: Abnormal   Collection Time: 04/21/21  3:43 AM  Result Value Ref Range   Glucose-Capillary 110 (H) 70 - 99 mg/dL    Comment: Glucose reference range applies only to samples taken after fasting for at least 8 hours.  Urinalysis, Complete w Microscopic     Status: Abnormal   Collection Time: 04/21/21  4:10 AM  Result Value Ref Range   Color, Urine YELLOW (A) YELLOW   APPearance HAZY (A) CLEAR   Specific Gravity, Urine 1.018 1.005 - 1.030   pH 6.0 5.0 - 8.0   Glucose, UA >=500 (A) NEGATIVE mg/dL   Hgb urine dipstick NEGATIVE NEGATIVE   Bilirubin Urine NEGATIVE NEGATIVE   Ketones, ur NEGATIVE NEGATIVE mg/dL   Protein, ur NEGATIVE NEGATIVE mg/dL   Nitrite NEGATIVE NEGATIVE   Leukocytes,Ua NEGATIVE NEGATIVE   RBC / HPF 0-5 0 - 5 RBC/hpf   WBC,  UA 0-5 0 - 5 WBC/hpf   Bacteria, UA RARE (A) NONE SEEN   Squamous Epithelial / LPF 0-5 0 - 5   Mucus PRESENT    Hyaline Casts, UA PRESENT     Comment: Performed at [facility], [address], [place], [place] [address]  Basic metabolic panel     Status: Abnormal   Collection Time: 04/21/21  6:25 AM  Result Value Ref Range   Sodium 132 (L) 135 - 145 mmol/L   Potassium 4.1 3.5 - 5.1 mmol/L   Chloride 98 98 - 111 mmol/L   CO2 25 22 - 32 mmol/L   Glucose, Bld 169 (H) 70 - 99 mg/dL    Comment: Glucose reference range applies only to samples taken after fasting for at least 8 hours.   BUN 15 8 - 23 mg/dL   Creatinine, Ser 0.90 0.61 - 1.24 mg/dL   Calcium 8.5 (L) 8.9 - 10.3 mg/dL   GFR, Estimated >60 >60 mL/min    Comment: (NOTE) Calculated using the CKD-EPI Creatinine Equation (2021)    Anion gap 9 5 - 15    Comment: Performed at [facility], [address] [place], [place] [address]  CBC     Status: Abnormal   Collection Time: 04/21/21  6:25 AM  Result Value Ref Range   WBC 6.7 4.0 - 10.5 K/uL   RBC 3.25 (L) 4.22 - 5.81 MIL/uL   Hemoglobin 8.4 (L) 13.0 - 17.0 g/dL   HCT 26.6 (L) 39.0 - 52.0 %   MCV 81.8 80.0 - 100.0 fL   MCH 25.8 (L) 26.0 - 34.0 pg   MCHC 31.6 30.0 - 36.0 g/dL   RDW 15.1 11.5 - 15.5 %   Platelets 219 150 - 400 K/uL   nRBC 0.0 0.0 - 0.2 %    Comment:  Performed at [facility], [address] [place], [place] [address]  CBG monitoring, ED     Status: Abnormal   Collection Time: 04/21/21  7:39 AM  Result Value Ref Range   Glucose-Capillary 207 (H) 70 - 99 mg/dL    Comment: Glucose reference range applies only to samples taken after fasting for at least 8 hours.  Glucose, capillary     Status: Abnormal   Collection Time: 04/21/21 11:08 AM  Result Value Ref Range   Glucose-Capillary 192 (H) 70 - 99 mg/dL    Comment: Glucose reference range applies only to samples taken after fasting for at least 8 hours.  Glucose, capillary      Status: Abnormal   Collection Time: 04/21/21  3:43 PM  Result Value Ref Range   Glucose-Capillary 232 (H) 70 - 99 mg/dL    Comment: Glucose reference range applies only to samples taken after fasting for at least 8 hours.  Glucose, capillary     Status: Abnormal   Collection Time: 04/21/21  8:32 PM  Result Value Ref Range   Glucose-Capillary 177 (H) 70 - 99 mg/dL    Comment: Glucose reference range applies only to samples taken after fasting for at least 8 hours.   Comment 1 Notify RN    Comment 2 Document in Chart   Basic metabolic panel     Status: Abnormal   Collection Time: 04/22/21  6:12 AM  Result Value Ref Range   Sodium 133 (L) 135 - 145 mmol/L   Potassium 4.4 3.5 - 5.1 mmol/L   Chloride 98 98 - 111 mmol/L   CO2 27 22 - 32 mmol/L   Glucose, Bld 253 (H) 70 - 99 mg/dL    Comment: Glucose reference range applies only to samples taken after fasting for at least 8 hours.   BUN 17 8 - 23 mg/dL   Creatinine, Ser 0.87 0.61 - 1.24 mg/dL   Calcium 8.6 (L) 8.9 - 10.3 mg/dL   GFR, Estimated >60 >60 mL/min    Comment: (NOTE) Calculated using the CKD-EPI Creatinine Equation (2021)    Anion gap 8 5 - 15    Comment: Performed at [facility], [address] [place], [place] [address]  Glucose, capillary     Status: Abnormal   Collection Time: 04/22/21  7:54 AM  Result Value Ref Range   Glucose-Capillary 252 (H) 70 - 99 mg/dL    Comment: Glucose reference range applies only to samples taken after fasting for at least 8 hours.  Glucose, capillary     Status: Abnormal   Collection Time: 04/22/21 10:16 AM  Result Value Ref Range   Glucose-Capillary 160 (H) 70 - 99 mg/dL    Comment: Glucose reference range applies only to samples taken after fasting for at least 8 hours.  Glucose, capillary     Status: Abnormal   Collection Time: 04/22/21 11:14 AM  Result Value Ref Range   Glucose-Capillary 144 (H) 70 - 99 mg/dL    Comment: Glucose reference range applies only to samples  taken after fasting for at least 8 hours.  Glucose, capillary     Status: Abnormal   Collection Time: 04/22/21 12:16 PM  Result Value Ref Range   Glucose-Capillary 158 (H) 70 - 99 mg/dL    Comment: Glucose reference range applies only to samples taken after fasting for at least 8 hours.  Type and screen Ordered by ORDER LAB     Status: None   Collection Time: 04/22/21  1:45 PM  Result Value Ref Range   ABO/RH(D) A POS    Antibody Screen NEG    Sample Expiration 04/25/2021,2359    Unit Number [id number]    Blood Component Type RED CELLS,LR    Unit division 00    Status of Unit ISSUED,FINAL    Transfusion Status OK TO TRANSFUSE    Crossmatch Result Compatible    Unit Number [id number]    Blood Component Type RED CELLS,LR    Unit division 00    Status of Unit ISSUED,FINAL    Transfusion Status OK TO TRANSFUSE    Crossmatch Result      Compatible Performed at [facility], [address] [place], [place] [address]   BPAM RBC     Status: None   Collection Time: 04/22/21  1:45 PM  Result Value Ref Range   ISSUE DATE / TIME [id number]    Blood Product Unit Number [id number]    PRODUCT CODE [id number]    Unit Type and Rh 6200    Blood Product Expiration Date 623762831517    ISSUE DATE / TIME 616073710626    Blood Product Unit Number [id number]    PRODUCT CODE [id number]    Unit Type and Rh 6200    Blood Product Expiration Date 371696789381   Glucose, capillary     Status: Abnormal   Collection Time: 04/22/21  4:27 PM  Result Value Ref Range   Glucose-Capillary 207 (H) 70 - 99 mg/dL    Comment: Glucose reference range applies only to samples taken after fasting for at least 8 hours.  Glucose, capillary     Status: Abnormal   Collection Time: 04/22/21  9:21 PM  Result Value Ref Range   Glucose-Capillary 246 (H) 70 - 99 mg/dL    Comment: Glucose reference range applies only to samples taken after fasting for at least 8 hours.  Basic metabolic panel      Status: Abnormal   Collection Time: 04/23/21  4:42 AM  Result Value Ref Range   Sodium 135 135 - 145 mmol/L   Potassium 4.0 3.5 - 5.1 mmol/L   Chloride 103 98 - 111 mmol/L   CO2 24 22 - 32 mmol/L   Glucose, Bld 207 (H) 70 - 99 mg/dL    Comment: Glucose reference range applies only to samples taken after fasting for at least 8 hours.   BUN 19 8 - 23 mg/dL   Creatinine, Ser 0.98 0.61 - 1.24 mg/dL   Calcium 8.2 (L) 8.9 - 10.3 mg/dL   GFR, Estimated >60 >60 mL/min    Comment: (NOTE) Calculated using the CKD-EPI Creatinine Equation (2021)    Anion gap 8 5 - 15    Comment: Performed at [facility], [address] [place], [place] [address]  CBC     Status: Abnormal   Collection Time: 04/23/21  4:42 AM  Result Value Ref Range   WBC 3.8 (L) 4.0 - 10.5 K/uL   RBC 3.52 (L) 4.22 - 5.81 MIL/uL   Hemoglobin 8.9 (L) 13.0 - 17.0 g/dL   HCT 27.8 (L) 39.0 - 52.0 %   MCV 79.0 (L) 80.0 - 100.0 fL   MCH 25.3 (L) 26.0 - 34.0 pg   MCHC 32.0 30.0 - 36.0 g/dL   RDW 15.1 11.5 - 15.5 %   Platelets 209 150 - 400 K/uL   nRBC 0.0 0.0 - 0.2 %    Comment: Performed at [facility], [address], [place], [place] [address]  Magnesium     Status: None   Collection Time: 04/23/21  4:42 AM  Result Value Ref Range   Magnesium 1.9 1.7 - 2.4 mg/dL    Comment: Performed at [facility], [address] [place], [place] [address]  Protime-INR     Status: None   Collection Time: 04/23/21  4:42 AM  Result Value Ref Range   Prothrombin Time 14.7 11.4 - 15.2 seconds   INR 1.1 0.8 - 1.2    Comment: (NOTE) INR goal varies based on device and disease states. Performed at [facility], [address] [place], [place] [address]   APTT     Status: Abnormal   Collection Time: 04/23/21  4:42 AM  Result Value Ref Range   aPTT 44 (H) 24 - 36 seconds    Comment:        IF BASELINE aPTT IS ELEVATED, SUGGEST PATIENT RISK ASSESSMENT BE USED TO DETERMINE  APPROPRIATE ANTICOAGULANT THERAPY. Performed at [facility], [address] [place], [place] [address]   Glucose, capillary     Status: Abnormal   Collection Time: 04/23/21  7:25 AM  Result Value Ref Range   Glucose-Capillary 195 (H) 70 - 99 mg/dL    Comment: Glucose reference range applies only to samples taken after fasting for at least 8 hours.  Glucose, capillary     Status: Abnormal   Collection Time: 04/23/21 12:32 PM  Result Value Ref Range   Glucose-Capillary 178 (H) 70 - 99 mg/dL    Comment: Glucose reference range applies only to samples taken after fasting for at least 8 hours.  Glucose, capillary     Status: Abnormal   Collection Time: 04/23/21  2:49 PM  Result Value Ref Range   Glucose-Capillary 109 (H) 70 - 99 mg/dL    Comment: Glucose reference range applies only to samples taken after fasting for at least 8 hours.  Prepare RBC (crossmatch)     Status: None   Collection Time: 04/23/21  4:14 PM  Result Value Ref Range   Order Confirmation      ORDER PROCESSED BY BLOOD BANK Performed at [facility], [address] [place], [place] [address]   Surgical pathology     Status: None   Collection Time: 04/23/21  4:16 PM  Result Value Ref Range   SURGICAL PATHOLOGY      SURGICAL PATHOLOGY CASE: [id number] PATIENT: [place] Surgical Pathology Report     Specimen Submitted: A. Left leg, below knee amputation  Clinical History: Atherosclerotic disease with ulceration      DIAGNOSIS: A. LEFT LEG, BELOW KNEE AMPUTATION: - SEVERE CALCIFIC ATHEROSCLEROSIS. - ACUTE OSTEOMYELITIS. - SKIN AND SOFT TISSUE WITH ULCERATION AND NECROSIS. - RESECTION MARGINS APPEAR NEGATIVE FOR ACTIVE INFLAMMATION. - NEGATIVE FOR MALIGNANCY.   GROSS DESCRIPTION: A. Labeled: Left below-knee amputation Received: Fresh Collection time: 4:16 PM on 04/24/2021 Placed into formalin time: Not applicable Size: 42 x 18.5 x 9.5 cm Description of lesion(s):  Received is a left below the knee amputation specimen.  The great and fourth toes are present each with a toenail. The second, third, and fifth toes have previously been amputated.  At the second and third toe prior amputation site there is a 1.9 cm curvilinear well-healed scar and at the fifth toe pr ior amputation site there is a 4.5 cm long linear well-healed scar.  The lateral heel has a 6.2 x 5 cm area of ulceration.  The skin surrounding the ulceration the ankle  is purple-tan and firm.  The posterior aspect of the leg has a 0.3 x 0.2 cm flattened area of dark brown discoloration.  The remaining skin is tan and smooth. Proximal margin: The proximal margin is grossly viable with tan smooth skin, tan soft tissue, red-brown muscle, and tan firm bone. Bone: The bone underlying the ulceration is firm and grossly unremarkable. Other findings: The anterior tibial artery displays minimal atherosclerosis.  The posterior tibial artery displays moderate to severe calcific atherosclerosis.  Block summary: 1 - representative tibial resection margin reamings 2 - representative skin, soft tissue, muscle from resection margin, en face 3 - representative ulceration with underlying bone 4 - representative discolored skin adjacent to ulceration 5 - representative previous toe  amputation site scarring 6 - posterior leg skin discoloration, submitted entirely 7 - representative anterior tibial artery cross-sections (proximal and distal) 8 - representative posterior tibial artery cross-sections (proximal and distal)  Tissue decalcification: Cassettes 1, 3, and 8  RB 04/24/2021  Final Diagnosis performed by Betsy Pries, MD.   Electronically signed 04/27/2021 9:19:37AM The electronic signature indicates that the named Attending Pathologist has evaluated the specimen Technical component performed at [facility], [address], [place], [place] [address] Lab: [phone] Dir: Rush Farmer, MD, MMM   Professional component performed at [facility], [facility], [address], [place], [place] [address] Lab: [phone] Dir: Dellia Nims. Rubinas, MD   Glucose, capillary     Status: Abnormal   Collection Time: 04/23/21  5:06 PM  Result Value Ref Range   Glucose-Capillary 128 (H) 70 - 99 mg/dL    Comment: Glucose reference range applies only to samples taken after fasting for at least 8 hours.  Glucose, capillary     Status: Abnormal   Collection Time: 04/23/21  9:35 PM  Result Value Ref Range   Glucose-Capillary 242 (H) 70 - 99 mg/dL    Comment: Glucose reference range applies only to samples taken after fasting for at least 8 hours.  CBC     Status: Abnormal   Collection Time: 04/24/21  6:13 AM  Result Value Ref Range   WBC 5.0 4.0 - 10.5 K/uL   RBC 3.30 (L) 4.22 - 5.81 MIL/uL   Hemoglobin 8.6 (L) 13.0 - 17.0 g/dL   HCT 26.6 (L) 39.0 - 52.0 %   MCV 80.6 80.0 - 100.0 fL   MCH 26.1 26.0 - 34.0 pg   MCHC 32.3 30.0 - 36.0 g/dL   RDW 14.8 11.5 - 15.5 %   Platelets 236 150 - 400 K/uL   nRBC 0.0 0.0 - 0.2 %    Comment: Performed at [facility], [address] [place], [place] [address]  Glucose, capillary     Status: Abnormal   Collection Time: 04/24/21  7:58 AM  Result Value Ref Range   Glucose-Capillary 326 (H) 70 - 99 mg/dL    Comment: Glucose reference range applies only to samples taken after fasting for at least 8 hours.  Basic metabolic panel     Status: Abnormal   Collection Time: 04/24/21 11:37 AM  Result Value Ref Range   Sodium 133 (L) 135 - 145 mmol/L   Potassium 5.1 3.5 - 5.1 mmol/L   Chloride 106 98 - 111 mmol/L   CO2 22 22 - 32 mmol/L   Glucose, Bld 293 (H) 70 - 99 mg/dL    Comment: Glucose reference range applies only to samples taken after fasting for at least 8 hours.   BUN 23 8 - 23  mg/dL   Creatinine, Ser 0.99 0.61 - 1.24 mg/dL   Calcium 7.6 (L) 8.9 - 10.3 mg/dL   GFR, Estimated >60 >60 mL/min    Comment: (NOTE) Calculated using  the CKD-EPI Creatinine Equation (2021)    Anion gap 5 5 - 15    Comment: Performed at [facility], [address] [place], [place] [address]  Glucose, capillary     Status: Abnormal   Collection Time: 04/24/21 11:44 AM  Result Value Ref Range   Glucose-Capillary 263 (H) 70 - 99 mg/dL    Comment: Glucose reference range applies only to samples taken after fasting for at least 8 hours.  Glucose, capillary     Status: Abnormal   Collection Time: 04/24/21  5:18 PM  Result Value Ref Range   Glucose-Capillary 207 (H) 70 - 99 mg/dL    Comment: Glucose reference range applies only to samples taken after fasting for at least 8 hours.  Glucose, capillary     Status: Abnormal   Collection Time: 04/24/21  9:53 PM  Result Value Ref Range   Glucose-Capillary 210 (H) 70 - 99 mg/dL    Comment: Glucose reference range applies only to samples taken after fasting for at least 8 hours.   Comment 1 Notify RN   CBC     Status: Abnormal   Collection Time: 04/25/21  4:39 AM  Result Value Ref Range   WBC 3.6 (L) 4.0 - 10.5 K/uL   RBC 2.79 (L) 4.22 - 5.81 MIL/uL   Hemoglobin 7.3 (L) 13.0 - 17.0 g/dL   HCT 23.1 (L) 39.0 - 52.0 %   MCV 82.8 80.0 - 100.0 fL   MCH 26.2 26.0 - 34.0 pg   MCHC 31.6 30.0 - 36.0 g/dL   RDW 15.0 11.5 - 15.5 %   Platelets 195 150 - 400 K/uL   nRBC 0.0 0.0 - 0.2 %    Comment: Performed at [facility], [address], [place], [place] [address]  Basic metabolic panel     Status: Abnormal   Collection Time: 04/25/21  4:39 AM  Result Value Ref Range   Sodium 137 135 - 145 mmol/L   Potassium 4.5 3.5 - 5.1 mmol/L   Chloride 108 98 - 111 mmol/L   CO2 25 22 - 32 mmol/L   Glucose, Bld 196 (H) 70 - 99 mg/dL    Comment: Glucose reference range applies only to samples taken after fasting for at least 8 hours.   BUN 26 (H) 8 - 23 mg/dL   Creatinine, Ser 0.89 0.61 - 1.24 mg/dL   Calcium 8.0 (L) 8.9 - 10.3 mg/dL   GFR, Estimated >60 >60 mL/min    Comment:  (NOTE) Calculated using the CKD-EPI Creatinine Equation (2021)    Anion gap 4 (L) 5 - 15    Comment: Performed at [facility], [address] [place], [place] [address]  Magnesium     Status: None   Collection Time: 04/25/21  4:39 AM  Result Value Ref Range   Magnesium 2.0 1.7 - 2.4 mg/dL    Comment: Performed at [facility], [address] [place], [place] [address]  Glucose, capillary     Status: Abnormal   Collection Time: 04/25/21  7:26 AM  Result Value Ref Range   Glucose-Capillary 170 (H) 70 - 99 mg/dL    Comment: Glucose reference range applies only to samples taken after fasting for at least 8 hours.  Prepare RBC (crossmatch)  Status: None   Collection Time: 04/25/21  8:00 AM  Result Value Ref Range   Order Confirmation      DUPLICATE REQUEST Performed at [facility], [address] [place], [place] [address]   Glucose, capillary     Status: Abnormal   Collection Time: 04/25/21 11:32 AM  Result Value Ref Range   Glucose-Capillary 152 (H) 70 - 99 mg/dL    Comment: Glucose reference range applies only to samples taken after fasting for at least 8 hours.  Hemoglobin and hematocrit, blood     Status: Abnormal   Collection Time: 04/25/21  3:14 PM  Result Value Ref Range   Hemoglobin 8.8 (L) 13.0 - 17.0 g/dL   HCT 26.9 (L) 39.0 - 52.0 %    Comment: Performed at [facility], [address] [place], [place] [address]  Glucose, capillary     Status: Abnormal   Collection Time: 04/25/21  4:23 PM  Result Value Ref Range   Glucose-Capillary 226 (H) 70 - 99 mg/dL    Comment: Glucose reference range applies only to samples taken after fasting for at least 8 hours.  Glucose, capillary     Status: Abnormal   Collection Time: 04/25/21  9:06 PM  Result Value Ref Range   Glucose-Capillary 315 (H) 70 - 99 mg/dL    Comment: Glucose reference range applies only to samples taken after fasting for at least 8 hours.   Comment 1 Notify RN    CBC     Status: Abnormal   Collection Time: 04/26/21  5:59 AM  Result Value Ref Range   WBC 4.6 4.0 - 10.5 K/uL   RBC 3.44 (L) 4.22 - 5.81 MIL/uL   Hemoglobin 9.0 (L) 13.0 - 17.0 g/dL   HCT 28.0 (L) 39.0 - 52.0 %   MCV 81.4 80.0 - 100.0 fL   MCH 26.2 26.0 - 34.0 pg   MCHC 32.1 30.0 - 36.0 g/dL   RDW 15.3 11.5 - 15.5 %   Platelets 210 150 - 400 K/uL   nRBC 0.0 0.0 - 0.2 %    Comment: Performed at [facility], [address], [place], [place] [address]  Basic metabolic panel     Status: Abnormal   Collection Time: 04/26/21  5:59 AM  Result Value Ref Range   Sodium 135 135 - 145 mmol/L   Potassium 4.8 3.5 - 5.1 mmol/L   Chloride 103 98 - 111 mmol/L   CO2 25 22 - 32 mmol/L   Glucose, Bld 194 (H) 70 - 99 mg/dL    Comment: Glucose reference range applies only to samples taken after fasting for at least 8 hours.   BUN 36 (H) 8 - 23 mg/dL   Creatinine, Ser 0.84 0.61 - 1.24 mg/dL   Calcium 8.4 (L) 8.9 - 10.3 mg/dL   GFR, Estimated >60 >60 mL/min    Comment: (NOTE) Calculated using the CKD-EPI Creatinine Equation (2021)    Anion gap 7 5 - 15    Comment: Performed at [facility], [address] [place], [place] [address]  Magnesium     Status: None   Collection Time: 04/26/21  5:59 AM  Result Value Ref Range   Magnesium 1.9 1.7 - 2.4 mg/dL    Comment: Performed at [facility], [address] [place], [place] [address]  Glucose, capillary     Status: Abnormal   Collection Time: 04/26/21  7:31 AM  Result Value Ref Range   Glucose-Capillary 175 (  H) 70 - 99 mg/dL    Comment: Glucose reference range applies only to samples taken after fasting for at least 8 hours.  Glucose, capillary     Status: Abnormal   Collection Time: 04/26/21 11:26 AM  Result Value Ref Range   Glucose-Capillary 229 (H) 70 - 99 mg/dL    Comment: Glucose reference range applies only to samples taken after fasting for at least 8 hours.  Resp Panel by RT-PCR (Flu A&B, Covid)  Nasopharyngeal Swab     Status: None   Collection Time: 04/26/21  2:23 PM   Specimen: Nasopharyngeal Swab; Nasopharyngeal(NP) swabs in vial transport medium  Result Value Ref Range   SARS Coronavirus 2 by RT PCR NEGATIVE NEGATIVE    Comment: (NOTE) SARS-CoV-2 target nucleic acids are NOT DETECTED.  The SARS-CoV-2 RNA is generally detectable in upper respiratory specimens during the acute phase of infection. The lowest concentration of SARS-CoV-2 viral copies this assay can detect is 138 copies/mL. A negative result does not preclude SARS-Cov-2 infection and should not be used as the sole basis for treatment or other patient management decisions. A negative result may occur with  improper specimen collection/handling, submission of specimen other than nasopharyngeal swab, presence of viral mutation(s) within the areas targeted by this assay, and inadequate number of viral copies(<138 copies/mL). A negative result must be combined with clinical observations, patient history, and epidemiological information. The expected result is Negative.  Fact Sheet for Patients:  EntrepreneurPulse.com.au  Fact Sheet for Healthcare Providers:  IncredibleEmployment.be  This test is no t yet approved or cleared by the [place] FDA and  has been authorized for detection and/or diagnosis of SARS-CoV-2 by FDA under an Emergency Use Authorization (EUA). This EUA will remain  in effect (meaning this test can be used) for the duration of the COVID-19 declaration under Section 564(b)(1) of the Act, 21 U.S.C.section 360bbb-3(b)(1), unless the authorization is terminated  or revoked sooner.       Influenza A by PCR NEGATIVE NEGATIVE   Influenza B by PCR NEGATIVE NEGATIVE    Comment: (NOTE) The Xpert Xpress SARS-CoV-2/FLU/RSV plus assay is intended as an aid in the diagnosis of influenza from Nasopharyngeal swab specimens and should not be used as a sole basis for  treatment. Nasal washings and aspirates are unacceptable for Xpert Xpress SARS-CoV-2/FLU/RSV testing.  Fact Sheet for Patients: EntrepreneurPulse.com.au  Fact Sheet for Healthcare Providers: IncredibleEmployment.be  This test is not yet approved or cleared by the [place] FDA and has been authorized for detection and/or diagnosis of SARS-CoV-2 by FDA under an Emergency Use Authorization (EUA). This EUA will remain in effect (meaning this test can be used) for the duration of the COVID-19 declaration under Section 564(b)(1) of the Act, 21 U.S.C. section 360bbb-3(b)(1), unless the authorization is terminated or revoked.  Performed at [facility], [address] [place], [place] [address]   Glucose, capillary     Status: Abnormal   Collection Time: 04/26/21  4:26 PM  Result Value Ref Range   Glucose-Capillary 209 (H) 70 - 99 mg/dL    Comment: Glucose reference range applies only to samples taken after fasting for at least 8 hours.  Glucose, capillary     Status: Abnormal   Collection Time: 04/26/21  8:03 PM  Result Value Ref Range   Glucose-Capillary 295 (H) 70 - 99 mg/dL    Comment: Glucose reference range applies only to samples taken after fasting for at least 8 hours.   Comment 1 Notify  RN    Comment 2 Document in Chart   Glucose, capillary     Status: Abnormal   Collection Time: 04/27/21  7:33 AM  Result Value Ref Range   Glucose-Capillary 156 (H) 70 - 99 mg/dL    Comment: Glucose reference range applies only to samples taken after fasting for at least 8 hours.  Glucose, capillary     Status: Abnormal   Collection Time: 04/27/21 11:45 AM  Result Value Ref Range   Glucose-Capillary 188 (H) 70 - 99 mg/dL    Comment: Glucose reference range applies only to samples taken after fasting for at least 8 hours.  Glucose, capillary     Status: Abnormal   Collection Time: 04/27/21  4:22 PM  Result Value Ref Range    Glucose-Capillary 192 (H) 70 - 99 mg/dL    Comment: Glucose reference range applies only to samples taken after fasting for at least 8 hours.  Glucose, capillary     Status: Abnormal   Collection Time: 04/27/21  8:05 PM  Result Value Ref Range   Glucose-Capillary 197 (H) 70 - 99 mg/dL    Comment: Glucose reference range applies only to samples taken after fasting for at least 8 hours.   Comment 1 Notify RN   Creatinine, serum     Status: None   Collection Time: 04/28/21  5:13 AM  Result Value Ref Range   Creatinine, Ser 0.99 0.61 - 1.24 mg/dL   GFR, Estimated >60 >60 mL/min    Comment: (NOTE) Calculated using the CKD-EPI Creatinine Equation (2021) Performed at [facility], [address] [place], [place] [address]   Glucose, capillary     Status: Abnormal   Collection Time: 04/28/21  8:06 AM  Result Value Ref Range   Glucose-Capillary 188 (H) 70 - 99 mg/dL    Comment: Glucose reference range applies only to samples taken after fasting for at least 8 hours.  Glucose, capillary     Status: Abnormal   Collection Time: 04/28/21 11:41 AM  Result Value Ref Range   Glucose-Capillary 184 (H) 70 - 99 mg/dL    Comment: Glucose reference range applies only to samples taken after fasting for at least 8 hours.  POCT HgB A1C     Status: Abnormal   Collection Time: 06/22/21  9:32 AM  Result Value Ref Range   Hemoglobin A1C 6.9 (A) 4.0 - 5.6 %   HbA1c POC (<> result, manual entry)     HbA1c, POC (prediabetic range)     HbA1c, POC (controlled diabetic range)    POCT Urinalysis Dipstick     Status: Abnormal   Collection Time: 06/22/21 11:18 AM  Result Value Ref Range   Color, UA     Clarity, UA     Glucose, UA Negative Negative   Bilirubin, UA Negative    Ketones, UA Negative    Spec Grav, UA <=1.005 (A) 1.010 - 1.025   Blood, UA small    pH, UA 8.5 (A) 5.0 - 8.0   Protein, UA Positive (A) Negative    Comment: 300   Urobilinogen, UA 2.0 (A) 0.2 or 1.0 E.U./dL   Nitrite, UA  Negative    Leukocytes, UA Large (3+) (A) Negative   Appearance     Odor          Assessment/Plan: 1. Encounter for general adult medical examination with abnormal findings CPE performed, UTD on colonoscopy, due for labs next visit  2. Type 2 diabetes mellitus with hyperglycemia, without  long-term current use of insulin (HCC) - POCT HgB A1C is 6.9 which is improved from last visit, will continue current medication and improve diet  3. Essential hypertension Stable, will f/u with cardiology for possible med management due to a few lower readings at home  4. COPD with hypoxia (HCC) Stable, wearing oxygen at night, looking into alt inhaler  5. S/P below knee amputation, left ([place]) Followed by vascular  6. Mixed hyperlipidemia Continue lovastatin  7. Urinary tract infection with hematuria, site unspecified Will go ahead and start on macrobid and adjust based on c/s - CULTURE, URINE COMPREHENSIVE - nitrofurantoin, macrocrystal-monohydrate, (MACROBID) 100 MG capsule; Take 1 cap twice per day for 10 days.  Dispense: 20 capsule; Refill: 0  8. Dysuria - POCT Urinalysis Dipstick   General Counseling: [name] verbalizes understanding of the findings of todays visit and agrees with plan of treatment. I have discussed any further diagnostic evaluation that may be needed or ordered today. We also reviewed his medications today. he has been encouraged to call the office with any questions or concerns that should arise related to todays visit.    Counseling:    Orders Placed This Encounter  Procedures   CULTURE, URINE COMPREHENSIVE   POCT HgB A1C   POCT Urinalysis Dipstick    Meds ordered this encounter  Medications   nitrofurantoin, macrocrystal-monohydrate, (MACROBID) 100 MG capsule    Sig: Take 1 cap twice per day for 10 days.    Dispense:  20 capsule    Refill:  0    This patient was seen by Drema Dallas, PA-C in collaboration with Dr. Clayborn Bigness as a part of  collaborative care agreement.  Total time spent:35 Minutes  Time spent includes review of chart, medications, test results, and follow up plan with the patient.     [name], MD  Internal Medicine

## 2021-06-23 ENCOUNTER — Other Ambulatory Visit: Payer: Self-pay

## 2021-06-23 ENCOUNTER — Telehealth: Payer: Self-pay

## 2021-06-23 NOTE — Patient Outreach (Signed)
[name]) Care Management  [place]  06/23/2021   [name] 09/04/1951 [id number]  Subjective: Telephone call to patient. He reports doing well. Home health continues to visit for therapy and wound care.  Reiterated signs of infection.  Diabetes management continues 6.9 A1c.   Objective:   Encounter Medications:  Outpatient Encounter Medications as of 06/23/2021  Medication Sig   acetaminophen (TYLENOL) 325 MG tablet Take 650 mg by mouth every 6 (six) hours as needed for moderate pain.   aspirin EC 81 MG tablet Take 81 mg by mouth daily.   Blood Glucose Calibration (TRUE METRIX LEVEL 1) Low SOLN Use as directed when needed dx E11.65   Blood Glucose Monitoring Suppl (TRUE METRIX GO GLUCOSE METER) w/Device KIT Use as directed to check glucose DX e11.65   Calcium Carbonate-Vitamin D (CALCIUM PLUS VITAMIN D PO) Take 1 tablet by mouth daily.   clopidogrel (PLAVIX) 75 MG tablet Take 1 tablet (75 mg total) by mouth daily.   Continuous Blood Gluc Receiver (DEXCOM G6 RECEIVER) DEVI Use as directed every 14 days to monitor blood sugars   Continuous Blood Gluc Sensor (DEXCOM G6 SENSOR) MISC Use as directed every 14 days to monitor blood sugars   Continuous Blood Gluc Transmit (DEXCOM G6 TRANSMITTER) MISC Use as directed every 14 days to monitor blood sugars   cyclobenzaprine (FLEXERIL) 10 MG tablet Take 1 tablet (10 mg total) by mouth 3 (three) times daily as needed for muscle spasms.   diphenhydrAMINE (BENADRYL) 25 mg capsule Take 25 mg by mouth every 6 (six) hours as needed for allergies.   gabapentin (NEURONTIN) 300 MG capsule Take 1 capsule (300 mg total) by mouth 3 (three) times daily.   glucose blood (TRUE METRIX BLOOD GLUCOSE TEST) test strip TEST BLOOD SUGAR TWICE DAILY AS DIRECTED   glyBURIDE (DIABETA) 5 MG tablet TAKE 1 TABLET TWICE DAILY WITH MEALS   Lancets (ACCU-CHEK SOFT TOUCH) lancets Use as instructed   lovastatin (MEVACOR) 10 MG tablet Take 1 tab   Po QHS   meloxicam (MOBIC) 15 MG tablet Take 15 mg by mouth daily.   metoprolol succinate (TOPROL-XL) 50 MG 24 hr tablet Take 1 tablet (50 mg total) by mouth daily. Take with or immediately following a meal.   mometasone-formoterol (DULERA) 100-5 MCG/ACT AERO Inhale 2 puffs into the lungs 2 (two) times daily as needed for wheezing.   nitrofurantoin, macrocrystal-monohydrate, (MACROBID) 100 MG capsule Take 1 cap twice per day for 10 days.   TRUEplus Lancets 30G MISC by Does not apply route. Use as directed twice a daily Dx E11.65   No facility-administered encounter medications on file as of 06/23/2021.    Functional Status:  In your present state of health, do you have any difficulty performing the following activities: 06/22/2021 05/27/2021  Hearing? N N  Vision? N N  Difficulty concentrating or making decisions? N N  Walking or climbing stairs? Y Y  Comment - left BKA  Dressing or bathing? N N  Doing errands, shopping? Y Y  Comment - step daughter assists  Preparing Food and eating ? N N  Using the Toilet? N N  In the past six months, have you accidently leaked urine? N N  Do you have problems with loss of bowel control? N N  Managing your Medications? N Y  Comment - step daughter assists  Managing your Finances? N Y  Comment - step daughter assists  Housekeeping or managing your Housekeeping? Tempie Donning  Comment -  step daughter assists  Some recent data might be hidden    Fall/Depression Screening: Fall Risk  06/22/2021 05/27/2021 04/02/2021  Falls in the past year? 0 0 0  Number falls in past yr: - - -  Injury with Fall? - - -  Risk for fall due to : No Fall Risks - No Fall Risks  Follow up Falls evaluation completed - Falls evaluation completed   PHQ 2/9 Scores 06/22/2021 05/27/2021 04/02/2021 03/26/2021 01/28/2021 12/30/2020 11/25/2020  PHQ - 2 Score 0 0 0 0 0 0 0    Assessment:   Care Plan Care Plan : General Plan of Care (Adult)  Updates made by [name], RN since 06/23/2021  12:00 AM     Problem: [facility] (General Plan of Care)      Goal: Self-Management Plan Developed as evidenced by patient monitoring left BKA site for redness, swelling and drainage.   Start Date: 05/27/2021  Expected End Date: 07/27/2021  This Visit's Progress: On track  Recent Progress: On track  Priority: High  Note:   Evidence-based guidance:  Review biopsychosocial determinants of health screens.  Determine level of modifiable health risk.  Assess level of patient activation, level of readiness, importance and confidence to make changes.  Evoke change talk using open-ended questions, pros and cons, as well as looking forward.  Identify areas where behavior change may lead to improved health.  Partner with patient to develop a robust self-management plan that includes lifestyle factors, such as weight loss, exercise and healthy nutrition, as well as goals specific to disease risks.  Support patient and family/caregiver active participation in decision-making and self-management plan.  Implement additional goals and interventions based on identified risk factors to reduce health risk.  Facilitate advance care planning.  Review need for preventive screening based on age, sex, family history and health history.   Notes:     Task: Mutually Develop and Royce Macadamia Achievement of Patient Goals   Due Date: 07/27/2021  Priority: Routine  Responsible User: [name], RN  Note:   Care Management Activities:    - collaboration with team encouraged - questions answered - reassurance provided    Notes: 05/27/21 Patient with recent left BKA after foot wound infection.  Patient reports wound is dressed with gauze. He denies any redness, swelling or drainage.  Home health involved. Wound Management Discussed Monitor for swelling, redness, pain, pus, and fever Keep wound clean and dry.   Notify physician immediately for any changes  06/08/21 Patient denies redness or swelling to  left stump site. Home health continues with dressing changes. Reviewed with patient signs of infection.   06/23/21 Patient denies signs of infection.  Home health continues for dressing changes and therapy.  Reiterated signs of infection.         Care Plan : Diabetes Type 2 (Adult)  Updates made by [name], RN since 06/23/2021 12:00 AM     Problem: Glycemic Management (Diabetes, Type 2)      Long-Range Goal: Glycemic Management Optimized as evidenced by A1c less than 8.5   Start Date: 11/25/2020  Expected End Date: 03/26/2022  This Visit's Progress: On track  Recent Progress: On track  Priority: Medium  Note:   Evidence-based guidance:  Promote self-monitoring of blood glucose levels.  Assess and address barriers to management plan, such as food insecurity, age,  developmental ability, depression, anxiety, fear of hypoglycemia or weight gain,  as well as medication cost, side effects and complicated regimen.  Notes: 01/15/21 Patient  still not checking sugars. Discussed rationale for checking sugars.      Task: Alleviate Barriers to Glycemic Management   Due Date: 03/26/2022  Priority: Routine  Responsible User: [name], RN  Note:   Care Management Activities:    - barriers to adherence to treatment plan identified - blood glucose monitoring encouraged - use of blood glucose monitoring log promoted    Notes: Patient has not checked blood sugar since being at home yet.  Encouraged patient to check sugars regularly. 01/15/21 Patient still not checking sugars.  Denies any barriers to checking sugars. CM continues to encourage to check sugars.  12/22/20 Patient still not checking sugars regularly.  Patient reports no barriers to checking sugars such as machine not working or supplies or inability to check.  02/19/21 Patient continues not to check sugars.  Encouraged to check. Discussed carbohydrate limiting.  A1c 7.1. 05/27/21 Patient last sugar check 202. Patient checks sugar  at least weekly. Discussed importance of monitoring sugar regularly.   Diabetes Management Discussed: Medication adherence Reviewed importance of limiting carbs such as rice, potatoes, breads, and pastas. Also discussed limiting sweets and sugary drinks.  Discussed importance of portion control.  Also discussed importance of annual exams, foot exams, and eye exams.   06/08/21 Patient sugar this am was in the 200's per patient. He could not remember the exact number but states he had already eaten when he checked it.  Stressed importance of diabetes management in wound healing. Patient verbalized understanding.  06/23/21 Patient checking sugars at least daily reports blood sugars 100-150.  A1c 6.9.  Encouraged patient to keep up the great work with his diabetes.       Goals Addressed               This Visit's Progress     Careful Skin Care-incision to left BKA site (pt-stated)   On track     Barriers: Knowledge Timeframe:  Short-Term Goal Priority:  High Start Date:   11/25/20                          Expected End Date:  07/27/21           Follow Up Date  06/26/21  - clean and dry skin well  -keep dressing intact.  -monitor for signs such as redness, swelling or pain to site   Why is this important?    Taking really good care of your skin will help to keep your skin unbroken.    Notes: 11/25/20 Patient reports wound vac intact with no redness or swelling.  12/03/20 Patient to wear wound vac for 2 more weeks. 12/09/20 Wound making progress per patient 12/22/20 Wound making progress 01/15/21 Patient reports some wound closure.  Patient recently saw podiatrist.   02/19/21 Wound vac discontinued.  Wound clean and dry.  Home health changing dressing weekly per patient. 03/26/21 Wound to heel completely healed.  Advised to keep pressure off wound.   05/27/21 Patient with new left BKA incision site. Patient reports area with no redness or drainage. Home health involved.  Patient reports being  independent with basic ADL's. Daughter helps as needed.  Wound Management Discussed Monitor for swelling, redness, pain, pus, and fever Keep wound clean and dry.   Notify physician immediately for any changes  06/08/21 Patient reports his left BKA site is doing good.  No redness swelling or drainage. [facility] continues with dressing changes and wound care. Due from  a visit this morning per patient. Reviewed wound management. He verbalized understanding.   06/23/21 Patient denies signs of infection.  Home health continues with dressing changes. Reiterated signs of infection.        Monitor and Manage My Blood Sugar-Diabetes Type 2   On track     Barriers: Health Behaviors Knowledge Timeframe:  Long-Range Goal Priority:  High Start Date:    11/25/20                         Expected End Date:   03/26/22 Follow Up Date 07/27/21  - check blood sugar at prescribed times    Why is this important?   Checking your blood sugar at home helps to keep it from getting very high or very low.  Writing the results in a diary or log helps the doctor know how to care for you.  Your blood sugar log should have the time, date and the results.  Also, write down the amount of insulin or other medicine that you take.  Other information, like what you ate, exercise done and how you were feeling, will also be helpful.     Notes: Patient currently not checking encouraged to check. 12/03/20 Patient not checking sugars currently. Encouraged patient to check sugars. 12/09/20 Patient checked blood sugar it was 223.   12/22/20  PLEASE CHECK YOUR BLOOD SUGARS. 01/15/21 Please check your sugars.   02/17/21 Not checking sugars at home.  "Hard to bleed"  02/19/21 Not checking sugars. He states he does not bleed.  Discussed methods to promote bleeding. 03/26/21 Patient reports checking sugars now.  Encouraged to continue.  Blood sugar this am was 190.   Diabetes Management Discussed: Medication adherence Reviewed importance of  limiting carbs such as rice, potatoes, breads, and pastas. Also discussed limiting sweets and sugary drinks.  Discussed importance of portion control.  Also discussed importance of annual exams, foot exams, and eye exams.   05/27/21 Patient reports last sugar check was 202.  Reviewed diabetes management and importance. Patient checks sugars at least weekly.  Diabetes Management Discussed: Medication adherence Reviewed importance of limiting carbs such as rice, potatoes, breads, and pastas. Also discussed limiting sweets and sugary drinks.  Discussed importance of portion control.  Also discussed importance of annual exams, foot exams, and eye exams.   06/08/21 Patient reports sugar this am was in the 200's. He could not remember exact number but states he had already eaten. Discussed checking sugar prior to eating in the morning. He verbalized understanding.  Reviewed diabetes management and importance for wound healing.  He verbalized understanding.   06/23/21 Patient checking sugars at least daily.  Sugar ranging recently 100-150. Discussed diabetes management.  Keep up the great work!!        Plan:  Follow-up: Patient agrees to Care Plan and Follow-up. Follow-up in 3-4  week(s)  [name], RN, MSN [facility] Care Management Coordinator Direct Line [phone] Cell [phone] Toll Free: [phone]  Fax: [phone]

## 2021-06-23 NOTE — Progress Notes (Signed)
Chronic Care Management Pharmacy Note  06/26/2021 Name:  [name] MRN:  [id number] DOB:  01/05/1951  Subjective: [name] is an 70 y.o. year old male who is a primary patient of [name], [name], MD.  The CCM team was consulted for assistance with disease management and care coordination needs.    Engaged with patient face to face for initial visit in response to provider referral for pharmacy case management and/or care coordination services.   Consent to Services:  The patient was given the following information about Chronic Care Management services today, agreed to services, and gave verbal consent: 1. CCM service includes personalized support from designated clinical staff supervised by the primary care provider, including individualized plan of care and coordination with other care providers 2. 24/7 contact phone numbers for assistance for urgent and routine care needs. 3. Service will only be billed when office clinical staff spend 20 minutes or more in a month to coordinate care. 4. Only one practitioner may furnish and bill the service in a calendar month. 5.The patient may stop CCM services at any time (effective at the end of the month) by phone call to the office staff. 6. The patient will be responsible for cost sharing (co-pay) of up to 20% of the service fee (after annual deductible is met). Patient agreed to services and consent obtained.  Patient Care Team: [name], MD as PCP - General (Internal Medicine) Edythe Clarity, [facility] as Pharmacist (Pharmacist) [name], RN as [place] Management    Recent office visits:  None since 05/14/21   Recent consult visits:  05/21/21 Vasc Surg Kris Hartmann, NP. For follow-up. STARTED Doxycyline 100 mg 2 times daily.    Hospital visits:  None since 05/14/21  Objective:  Lab Results  Component Value Date   CREATININE 0.99 04/28/2021   BUN 36 (H) 04/26/2021   GFRNONAA >60 04/28/2021   GFRAA  >60 06/20/2020   NA 135 04/26/2021   K 4.8 04/26/2021   CALCIUM 8.4 (L) 04/26/2021   CO2 25 04/26/2021   GLUCOSE 194 (H) 04/26/2021    Lab Results  Component Value Date/Time   HGBA1C 6.9 (A) 06/22/2021 09:32 AM   HGBA1C 8.3 (H) 04/20/2021 02:09 PM   HGBA1C 7.1 (A) 01/28/2021 10:15 AM   HGBA1C 8.9 (H) 11/13/2020 06:36 AM   MICROALBUR <3.0 (H) 01/04/2017 08:03 AM    Last diabetic Eye exam: No results found for: HMDIABEYEEXA  Last diabetic Foot exam: No results found for: HMDIABFOOTEX   Lab Results  Component Value Date   CHOL 110 06/20/2020   HDL 28 (L) 06/20/2020   LDLCALC 50 06/20/2020   TRIG 159 (H) 06/20/2020   CHOLHDL 3.9 06/20/2020    Hepatic Function Latest Ref Rng & Units 04/20/2021 11/19/2020 11/18/2020  Total Protein 6.5 - 8.1 g/dL 7.1 7.0 6.4(L)  Albumin 3.5 - 5.0 g/dL 2.7(L) 3.0(L) 2.7(L)  AST 15 - 41 U/L $Remo'17 19 21  'kvZfb$ ALT 0 - 44 U/L $Remo'14 18 18  'gaQtQ$ Alk Phosphatase 38 - 126 U/L 76 51 42  Total Bilirubin 0.3 - 1.2 mg/dL 0.6 1.2 1.0    Lab Results  Component Value Date/Time   TSH 3.259 11/12/2020 09:00 AM   TSH 1.838 11/11/2020 06:45 PM   FREET4 0.76 06/20/2020 10:01 AM   FREET4 1.07 10/18/2018 09:59 AM    CBC Latest Ref Rng & Units 04/26/2021 04/25/2021 04/25/2021  WBC 4.0 - 10.5 K/uL 4.6 - 3.6(L)  Hemoglobin  13.0 - 17.0 g/dL 9.0(L) 8.8(L) 7.3(L)  Hematocrit 39.0 - 52.0 % 28.0(L) 26.9(L) 23.1(L)  Platelets 150 - 400 K/uL 210 - 195    No results found for: VD25OH  Clinical ASCVD: No  The ASCVD Risk score (Arnett DK, et al., 2019) failed to calculate for the following reasons:   The valid total cholesterol range is 130 to 320 mg/dL    Depression screen [facility] 2/9 06/22/2021 05/27/2021 04/02/2021  Decreased Interest 0 0 0  Down, Depressed, Hopeless 0 0 0  PHQ - 2 Score 0 0 0  Some recent data might be hidden      Social History   Tobacco Use  Smoking Status Former   Types: Cigarettes   Quit date: 02/24/1997   Years since quitting: 24.3  Smokeless Tobacco Never    BP Readings from Last 3 Encounters:  06/24/21 (!) 146/80  06/22/21 126/83  06/04/21 134/75   Pulse Readings from Last 3 Encounters:  06/24/21 83  06/22/21 83  06/04/21 85   Wt Readings from Last 3 Encounters:  06/24/21 141 lb (64 kg)  06/22/21 141 lb (64 kg)  06/04/21 141 lb (64 kg)   BMI Readings from Last 3 Encounters:  06/24/21 20.23 kg/m  06/22/21 20.23 kg/m  06/04/21 20.23 kg/m    Assessment/Interventions: Review of patient past medical history, allergies, medications, health status, including review of consultants reports, laboratory and other test data, was performed as part of comprehensive evaluation and provision of chronic care management services.   SDOH:  (Social Determinants of Health) assessments and interventions performed: Yes   Financial Resource Strain: Low Risk    Difficulty of Paying Living Expenses: Not very hard    SDOH Screenings   Alcohol Screen: Low Risk    Last Alcohol Screening Score (AUDIT): 0  Depression (PHQ2-9): Low Risk    PHQ-2 Score: 0  Financial Resource Strain: Low Risk    Difficulty of Paying Living Expenses: Not very hard  Food Insecurity: No Food Insecurity   Worried About Charity fundraiser in the Last Year: Never true   Ran Out of Food in the Last Year: Never true  Housing: Low Risk    Last Housing Risk Score: 0  Physical Activity: Not on file  Social Connections: Not on file  Stress: Not on file  Tobacco Use: Medium Risk   Smoking Tobacco Use: Former   Smokeless Tobacco Use: Never  Transportation Needs: No Transportation Needs   Lack of Transportation (Medical): No   Lack of Transportation (Non-Medical): No    CCM Care Plan  Allergies  Allergen Reactions   Penicillins Rash and Other (See Comments)    Rash in and around the mouth Has patient had a PCN reaction causing immediate rash, facial/tongue/throat swelling, SOB or lightheadedness with hypotension: Yes Has patient had a PCN reaction causing severe rash  involving mucus membranes or skin necrosis: Yes Has patient had a PCN reaction that required hospitalization: No Has patient had a PCN reaction occurring within the last 10 years: Yes If all of the above answers are "NO", then may proceed with Cephalosporin use.    Bactrim [Sulfamethoxazole-Trimethoprim] Other (See Comments)    Mouth sores, Sores develop in mouth    Medications Reviewed Today     Reviewed by Edythe Clarity, [facility] (Pharmacist) on 06/26/21 at 1017  Med List Status: <None>   Medication Order Taking? Sig Documenting Provider Last Dose Status Informant  acetaminophen (TYLENOL) 325 MG tablet [id number] Yes Take 650 mg  by mouth every 6 (six) hours as needed for moderate pain. [provider] Taking Active Self  aspirin EC 81 MG tablet [id number] Yes Take 81 mg by mouth daily. [provider] Taking Active Self  Blood Glucose Calibration (TRUE METRIX LEVEL 1) Low SOLN [id number] Yes Use as directed when needed dx E11.65 [name], MD Taking Active Self  Blood Glucose Monitoring Suppl (TRUE METRIX GO GLUCOSE METER) w/Device KIT [id number] Yes Use as directed to check glucose DX e11.65 [name], MD Taking Active Self  Calcium Carbonate-Vitamin D (CALCIUM PLUS VITAMIN D PO) [id number] Yes Take 1 tablet by mouth daily. [provider] Taking Active Self  clopidogrel (PLAVIX) 75 MG tablet [id number] Yes Take 1 tablet (75 mg total) by mouth daily. Kris Hartmann, NP Taking Active Self  Continuous Blood Gluc Receiver ([place]) Mazon [id number] Yes Use as directed every 14 days to monitor blood sugars [name], MD Taking Active Self  Continuous Blood Gluc Sensor (DEXCOM G6 SENSOR) MISC [id number] Yes Use as directed every 14 days to monitor blood sugars [name], MD Taking Active Self  Continuous Blood Gluc Transmit (DEXCOM G6 TRANSMITTER) MISC [id number] Yes Use as directed every 14 days to monitor blood sugars [name], MD Taking Active  Self  cyclobenzaprine (FLEXERIL) 10 MG tablet [id number] Yes Take 1 tablet (10 mg total) by mouth 3 (three) times daily as needed for muscle spasms. [name], MD Taking Active   diphenhydrAMINE (BENADRYL) 25 mg capsule [id number] Yes Take 25 mg by mouth every 6 (six) hours as needed for allergies. [provider] Taking Active Self  gabapentin (NEURONTIN) 300 MG capsule [id number] Yes Take 1 capsule (300 mg total) by mouth 3 (three) times daily. [name], MD Taking Active   glucose blood (TRUE METRIX BLOOD GLUCOSE TEST) test strip [id number] Yes TEST BLOOD SUGAR TWICE DAILY AS DIRECTED [name], MD Taking Active   glyBURIDE (DIABETA) 5 MG tablet [id number] Yes TAKE 1 TABLET TWICE DAILY WITH MEALS [name], MD Taking Active   Lancets [facility] SOFT [name]) lancets [id number] Yes Use as instructed Ronnell Freshwater, NP Taking Active Self  lovastatin (MEVACOR) 10 MG tablet [id number] Yes Take 1 tab  Po QHS [name], MD Taking Active Self  meloxicam (MOBIC) 15 MG tablet [id number] Yes Take 15 mg by mouth daily. [provider] Taking Active Self  metoprolol succinate (TOPROL-XL) 50 MG 24 hr tablet [id number] Yes Take 1 tablet (50 mg total) by mouth daily. Take with or immediately following a meal. [name], MD Taking Active Self  mometasone-formoterol [facility]) 100-5 MCG/ACT Hollie Salk [id number] Yes Inhale 2 puffs into the lungs 2 (two) times daily as needed for wheezing. [name], MD Taking Active Self  nitrofurantoin, macrocrystal-monohydrate, (MACROBID) 100 MG capsule [id number] Yes Take 1 cap twice per day for 10 days. Mylinda Latina, PA-C Taking Active   TRUEplus Lancets 30G MISC [id number] Yes by Does not apply route. Use as directed twice a daily Dx E11.65 [provider] Taking Active Self            Patient Active Problem List   Diagnosis Date Noted   Acute osteomyelitis of left foot ([place]) 04/20/2021   Anemia 04/20/2021   Elevated lactic  acid level 04/20/2021   Hyponatremia 04/20/2021   Bacteremia due to Enterococcus    Bacteremia due to methicillin susceptible Staphylococcus aureus (MSSA)    Diabetic foot infection ([place])  Sepsis ([place]) 11/11/2020   Diabetic foot ulcer ([place]) 11/11/2020   Moderate asthma without complication 05/39/7673   Bilateral impacted cerumen 08/09/2020   Acute otitis externa of both ears 08/09/2020   Primary generalized (osteo)arthritis 03/20/2020   Dysuria 06/15/2019   Atherosclerosis of artery of extremity with ulceration ([place]) 11/30/2018   Need for vaccination against Streptococcus pneumoniae using pneumococcal conjugate vaccine 13 10/26/2018   Overactive bladder 10/26/2018   Atherosclerosis of native arteries of the extremities with gangrene ([place]) 10/25/2018   Sore throat 04/03/2018   Oral candidiasis 04/03/2018   Uncontrolled type 2 diabetes mellitus with hyperglycemia ([place]) 03/06/2018   Acute non-recurrent pansinusitis 03/06/2018   Primary osteoarthritis of shoulders, bilateral 03/06/2018   GERD (gastroesophageal reflux disease) 11/03/2017   Carotid stenosis, right 05/12/2017   Encounter for general adult medical examination with abnormal findings 03/25/2017   PAD (peripheral artery disease) ([place]) 03/15/2017   Bilateral carotid artery stenosis 03/15/2017   Cardiomyopathy, nonischemic ([place]) 07/28/2015   Chest pain with low risk for cardiac etiology 12/04/2014   Paroxysmal SVT (supraventricular tachycardia) ([place]) 06/13/2014   Diabetes ([place]) 06/13/2014   Mixed hyperlipidemia 06/13/2014   Essential hypertension 06/13/2014   Arthritis 06/13/2014   H/O seasonal allergies 06/13/2014    Immunization History  Administered Date(s) Administered   Influenza, High Dose Seasonal PF 06/06/2019   Influenza-Unspecified 07/01/2020   Moderna SARS-COV2 Booster Vaccination 04/28/2021   Moderna Sars-Covid-2 Vaccination 01/22/2020, 02/19/2020, 07/01/2020   Pneumococcal Polysaccharide-23 06/19/2020     Conditions to be addressed/monitored:  HTN, PAD, SVT, GERD, Type II DM, Lipids, Arthritis  Care Plan : General Pharmacy (Adult)  Updates made by Edythe Clarity, RPH since 06/26/2021 12:00 AM     Problem: HTN, PAD, SVT, GERD, Type II DM, Lipids, Arthritis   Priority: High  Onset Date: 02/17/2021     Long-Range Goal: Patient-Specific Goal   Start Date: 02/17/2021  Expected End Date: 08/20/2021  Recent Progress: On track  Priority: High  Note:   Current Barriers:  Unable to independently afford treatment regimen Unable to independently monitor therapeutic efficacy  Pharmacist Clinical Goal(s):  Patient will verbalize ability to afford treatment regimen maintain control of BP as evidenced by home monitoring  adhere to plan to optimize therapeutic regimen for asthma as evidenced by report of adherence to recommended medication management changes contact provider office for questions/concerns as evidenced notation of same in electronic health record through collaboration with PharmD and provider.   Interventions: 1:1 collaboration with [name], MD regarding development and update of comprehensive plan of care as evidenced by provider attestation and co-signature Inter-disciplinary care team collaboration (see longitudinal plan of care) Comprehensive medication review performed; medication list updated in electronic medical record  Hypertension/SVT (BP goal <130/80) -Controlled -Current treatment: Toprol XL $RemoveB'50mg'UWhEdumA$  daily -Medications previously tried: Lisinopril (low BP)  -Current home readings: not checking -Current dietary habits: nothing specific, trying to maintain weight, protein shakes for snacks -Current exercise habits: minimal, goes fishing etc. -Denies hypotensive/hypertensive symptoms -Educated on BP goals and benefits of medications for prevention of heart attack, stroke and kidney damage; Daily salt intake goal < 2300 mg; Exercise goal of 150 minutes per  week; Importance of home blood pressure monitoring; Symptoms of hypotension and importance of maintaining adequate hydration; -Counseled to monitor BP at home at least once weekly or if symptomatic, document, and provide log at future appointments -Recently had low Bps and lisinopril was d/c, dose of Toprol XL was cut in half to $Remov'50mg'dIBFdy$  daily.  Has not  had any dizziness since. -Recommended to continue current medication Recommended start to occasionally monitor BP at home.  Hyperlipidemia/PAD: (LDL goal < 70) -Controlled -Current treatment: Lovastatin 10mg  Clopidogrel 75mg  daily -Medications previously tried: none noted   -Educated on Cholesterol goals;  Benefits of statin for ASCVD risk reduction; Importance of limiting foods high in cholesterol; Exercise goal of 150 minutes per week;  Most recent LDL is excellent -Recommended to continue current medication  Diabetes (A1c goal <7%) -Uncontrolled -Current medications: Glyburide 5mg  BID with meals -Medications previously tried: [place]  -Current home glucose readings fasting glucose: not checking post prandial glucose: not checking -Denies hypoglycemic/hyperglycemic symptoms -Current meal patterns:  breakfast: oatmeal, protein shakes  lunch: inconsistent dinner:  snacks: nabs, protein shakes drinks: water mainly -Current exercise: minimal, fishing -Educated on A1c and blood sugar goals; Prevention and management of hypoglycemic episodes; Benefits of routine self-monitoring of blood sugar; Importance of eating with doses of glyburide and the potential for hypoglycemia if he does not eat -Counseled to check feet daily and get yearly eye exams -Recommended to continue current medication Recommended to monitor at least occasionally fasting and if he ever feels like it is low.  Encouraged to eat with ALL doses of glyburide.  Update 06/26/21 146, 127, 146, 140, 172 - fasting glucose recently Glucose continues to fluctuate.  His  A1c improved at most recent MD visit! He denies any episodes of hypoglycemia since our last conversation.  At this point I think he is doing well with his glucose overall.  Encouraged him to continue to work on diet modifications and remember to eat with his glyburide.  Continue current meds  Arthritis (Goal: Control symmptoms) -Controlled -Current treatment  Meloxicam 15mg  daily Tramadol 50mg  - hs -Medications previously tried: none noted -Counseled on avoidance of other NSAIDs and to take Tylenol if he needed to for breakthrough headache or other minor aches/pains  -Recommended to continue current medication  Asthma (Goal: Control symptoms) -Controlled -Current treatment  Dulera 100-53mcg -Medications previously tried: none noted  -Reports copay is high and he does not have this at home -Does mention another inhaler and not sure which he is taking.  He is to call me when he gets home to let me know which inhaler he is using.  Will look into PAP to see if there is some help with [facility] copay. -Recommended to continue current medication  Update 06/26/21 He is using inhaler daily.  He still has not heard back from program regarding help with is copay for [facility].  We will follow up on application to [place].  If they cannot help with copay it may be an option to switch to Symbicort and apply for their patient assistance program.  Will follow up on response from [place].   Patient Goals/Self-Care Activities Patient will:  - take medications as prescribed focus on medication adherence by pill box check glucose when symptomatic, document, and provide at future appointments check blood pressure weekly, document, and provide at future appointments collaborate with provider on medication access solutions  Follow Up Plan: The care management team will reach out to the patient again over the next 120 days.             Medication Assistance: Application for [facility]  medication  assistance program. in process.  Anticipated assistance start date unknown.  See plan of care for additional detail.  Patient's preferred pharmacy is:  [facility], [address] [place] [address]  Phone: [phone] Fax: [phone]  [facility], [address] [place] [address] Phone: [phone] Fax: [phone]  Uses pill box? Yes Pt endorses 100% compliance  We discussed: Benefits of medication synchronization, packaging and delivery as well as enhanced pharmacist oversight with [employer]. Patient decided to: Continue current medication management strategy  Care Plan and Follow Up Patient Decision:  Patient agrees to Care Plan and Follow-up.  Plan: The care management team will reach out to the patient again over the next 120 days.  Beverly Milch, PharmD Clinical Pharmacist [place] [phone]

## 2021-06-23 NOTE — Patient Instructions (Signed)
Goals       Careful Skin Care-incision to left BKA site (pt-stated)      Barriers: Knowledge Timeframe:  Short-Term Goal Priority:  High Start Date:   11/25/20                          Expected End Date:  07/27/21           Follow Up Date  06/26/21  - clean and dry skin well  -keep dressing intact.  -monitor for signs such as redness, swelling or pain to site   Why is this important?    Taking really good care of your skin will help to keep your skin unbroken.    Notes: 11/25/20 Patient reports wound vac intact with no redness or swelling.  12/03/20 Patient to wear wound vac for 2 more weeks. 12/09/20 Wound making progress per patient 12/22/20 Wound making progress 01/15/21 Patient reports some wound closure.  Patient recently saw podiatrist.   02/19/21 Wound vac discontinued.  Wound clean and dry.  Home health changing dressing weekly per patient. 03/26/21 Wound to heel completely healed.  Advised to keep pressure off wound.   05/27/21 Patient with new left BKA incision site. Patient reports area with no redness or drainage. Home health involved.  Patient reports being independent with basic ADL's. Daughter helps as needed.  Wound Management Discussed Monitor for swelling, redness, pain, pus, and fever Keep wound clean and dry.   Notify physician immediately for any changes  06/08/21 Patient reports his left BKA site is doing good.  No redness swelling or drainage. [facility] continues with dressing changes and wound care. Due from a visit this morning per patient. Reviewed wound management. He verbalized understanding.   06/23/21 Patient denies signs of infection.  Home health continues with dressing changes. Reiterated signs of infection.        Monitor and Manage My Blood Sugar-Diabetes Type 2      Barriers: Health Behaviors Knowledge Timeframe:  Long-Range Goal Priority:  High Start Date:    11/25/20                         Expected End Date:   03/26/22 Follow Up Date 07/27/21  -  check blood sugar at prescribed times    Why is this important?   Checking your blood sugar at home helps to keep it from getting very high or very low.  Writing the results in a diary or log helps the doctor know how to care for you.  Your blood sugar log should have the time, date and the results.  Also, write down the amount of insulin or other medicine that you take.  Other information, like what you ate, exercise done and how you were feeling, will also be helpful.     Notes: Patient currently not checking encouraged to check. 12/03/20 Patient not checking sugars currently. Encouraged patient to check sugars. 12/09/20 Patient checked blood sugar it was 223.   12/22/20  PLEASE CHECK YOUR BLOOD SUGARS. 01/15/21 Please check your sugars.   02/17/21 Not checking sugars at home.  "Hard to bleed"  02/19/21 Not checking sugars. He states he does not bleed.  Discussed methods to promote bleeding. 03/26/21 Patient reports checking sugars now.  Encouraged to continue.  Blood sugar this am was 190.   Diabetes Management Discussed: Medication adherence Reviewed importance of limiting carbs such as rice, potatoes, breads, and pastas.  Also discussed limiting sweets and sugary drinks.  Discussed importance of portion control.  Also discussed importance of annual exams, foot exams, and eye exams.   05/27/21 Patient reports last sugar check was 202.  Reviewed diabetes management and importance. Patient checks sugars at least weekly.  Diabetes Management Discussed: Medication adherence Reviewed importance of limiting carbs such as rice, potatoes, breads, and pastas. Also discussed limiting sweets and sugary drinks.  Discussed importance of portion control.  Also discussed importance of annual exams, foot exams, and eye exams.   06/08/21 Patient reports sugar this am was in the 200's. He could not remember exact number but states he had already eaten. Discussed checking sugar prior to eating in the morning. He  verbalized understanding.  Reviewed diabetes management and importance for wound healing.  He verbalized understanding.   06/23/21 Patient checking sugars at least daily.  Sugar ranging recently 100-150. Discussed diabetes management.  Keep up the great work!!

## 2021-06-24 ENCOUNTER — Ambulatory Visit (INDEPENDENT_AMBULATORY_CARE_PROVIDER_SITE_OTHER): Payer: Medicare HMO | Admitting: Nurse Practitioner

## 2021-06-24 ENCOUNTER — Other Ambulatory Visit: Payer: Self-pay

## 2021-06-24 VITALS — BP 146/80 | HR 83 | Ht 70.0 in | Wt 141.0 lb

## 2021-06-24 DIAGNOSIS — E1165 Type 2 diabetes mellitus with hyperglycemia: Secondary | ICD-10-CM

## 2021-06-24 DIAGNOSIS — J449 Chronic obstructive pulmonary disease, unspecified: Secondary | ICD-10-CM | POA: Diagnosis not present

## 2021-06-24 DIAGNOSIS — I471 Supraventricular tachycardia: Secondary | ICD-10-CM | POA: Diagnosis not present

## 2021-06-24 DIAGNOSIS — E1169 Type 2 diabetes mellitus with other specified complication: Secondary | ICD-10-CM | POA: Diagnosis not present

## 2021-06-24 DIAGNOSIS — M86172 Other acute osteomyelitis, left ankle and foot: Secondary | ICD-10-CM | POA: Diagnosis not present

## 2021-06-24 DIAGNOSIS — E782 Mixed hyperlipidemia: Secondary | ICD-10-CM

## 2021-06-24 DIAGNOSIS — Z4781 Encounter for orthopedic aftercare following surgical amputation: Secondary | ICD-10-CM | POA: Diagnosis not present

## 2021-06-24 DIAGNOSIS — D649 Anemia, unspecified: Secondary | ICD-10-CM | POA: Diagnosis not present

## 2021-06-24 DIAGNOSIS — E11319 Type 2 diabetes mellitus with unspecified diabetic retinopathy without macular edema: Secondary | ICD-10-CM | POA: Diagnosis not present

## 2021-06-24 DIAGNOSIS — I10 Essential (primary) hypertension: Secondary | ICD-10-CM | POA: Diagnosis not present

## 2021-06-24 DIAGNOSIS — K811 Chronic cholecystitis: Secondary | ICD-10-CM | POA: Diagnosis not present

## 2021-06-24 DIAGNOSIS — Z89512 Acquired absence of left leg below knee: Secondary | ICD-10-CM

## 2021-06-25 ENCOUNTER — Ambulatory Visit (INDEPENDENT_AMBULATORY_CARE_PROVIDER_SITE_OTHER): Payer: Medicare HMO | Admitting: Nurse Practitioner

## 2021-06-26 ENCOUNTER — Encounter (INDEPENDENT_AMBULATORY_CARE_PROVIDER_SITE_OTHER): Payer: Medicare HMO

## 2021-06-26 ENCOUNTER — Ambulatory Visit: Payer: Self-pay | Admitting: Pharmacist

## 2021-06-26 ENCOUNTER — Ambulatory Visit (INDEPENDENT_AMBULATORY_CARE_PROVIDER_SITE_OTHER): Payer: Medicare HMO | Admitting: Vascular Surgery

## 2021-06-26 DIAGNOSIS — I10 Essential (primary) hypertension: Secondary | ICD-10-CM | POA: Diagnosis not present

## 2021-06-26 DIAGNOSIS — K219 Gastro-esophageal reflux disease without esophagitis: Secondary | ICD-10-CM | POA: Diagnosis not present

## 2021-06-26 DIAGNOSIS — E1165 Type 2 diabetes mellitus with hyperglycemia: Secondary | ICD-10-CM | POA: Diagnosis not present

## 2021-06-26 NOTE — Patient Instructions (Addendum)
Visit Information   Goals Addressed             This Visit's Progress    Monitor and Manage My Blood Sugar-Diabetes Type 2   On track    Barriers: Health Behaviors Knowledge Timeframe:  Long-Range Goal Priority:  High Start Date:    11/25/20                         Expected End Date:   03/26/22 Follow Up Date 07/27/21  - check blood sugar at prescribed times    Why is this important?   Checking your blood sugar at home helps to keep it from getting very high or very low.  Writing the results in a diary or log helps the doctor know how to care for you.  Your blood sugar log should have the time, date and the results.  Also, write down the amount of insulin or other medicine that you take.  Other information, like what you ate, exercise done and how you were feeling, will also be helpful.     Notes: Patient currently not checking encouraged to check. 12/03/20 Patient not checking sugars currently. Encouraged patient to check sugars. 12/09/20 Patient checked blood sugar it was 223.   12/22/20  PLEASE CHECK YOUR BLOOD SUGARS. 01/15/21 Please check your sugars.   02/17/21 Not checking sugars at home.  "Hard to bleed"  02/19/21 Not checking sugars. He states he does not bleed.  Discussed methods to promote bleeding. 03/26/21 Patient reports checking sugars now.  Encouraged to continue.  Blood sugar this am was 190.   Diabetes Management Discussed: Medication adherence Reviewed importance of limiting carbs such as rice, potatoes, breads, and pastas. Also discussed limiting sweets and sugary drinks.  Discussed importance of portion control.  Also discussed importance of annual exams, foot exams, and eye exams.   05/27/21 Patient reports last sugar check was 202.  Reviewed diabetes management and importance. Patient checks sugars at least weekly.  Diabetes Management Discussed: Medication adherence Reviewed importance of limiting carbs such as rice, potatoes, breads, and pastas. Also discussed  limiting sweets and sugary drinks.  Discussed importance of portion control.  Also discussed importance of annual exams, foot exams, and eye exams.   06/08/21 Patient reports sugar this am was in the 200's. He could not remember exact number but states he had already eaten. Discussed checking sugar prior to eating in the morning. He verbalized understanding.  Reviewed diabetes management and importance for wound healing.  He verbalized understanding.   06/23/21 Patient checking sugars at least daily.  Sugar ranging recently 100-150. Discussed diabetes management.  Keep up the great work!!       Patient Care Plan: General Plan of Care (Adult)     Problem Identified: Therapeutic Alliance (General Plan of Care)      Goal: Therapeutic Alliance Established as evidenced by patient maintaining contact with CM Completed 01/15/2021  Start Date: 11/25/2020  Expected End Date: 01/24/2021  Recent Progress: On track  Priority: High  Note:   Use empathy and nonjudgmental, participatory manner Notes:     Task: Develop Relationship to Effect Behavior Change Completed 01/15/2021  Due Date: 01/24/2021  Responsible User: [name], RN  Note:   Care Management Activities:    - emotional support provided - questions answered - reassurance provided    Notes: 12/03/20 patient talks with care manager and accepts advice.   12/22/20  Patient continues to accept CM interaction and  support    Goal: Self-Management Plan Developed as evidenced by patient monitoring left BKA site for redness, swelling and drainage.   Start Date: 05/27/2021  Expected End Date: 07/27/2021  This Visit's Progress: On track  Recent Progress: On track  Priority: High  Note:   Evidence-based guidance:  Review biopsychosocial determinants of health screens.  Determine level of modifiable health risk.  Assess level of patient activation, level of readiness, importance and confidence to make changes.  Evoke change talk using  open-ended questions, pros and cons, as well as looking forward.  Identify areas where behavior change may lead to improved health.  Partner with patient to develop a robust self-management plan that includes lifestyle factors, such as weight loss, exercise and healthy nutrition, as well as goals specific to disease risks.  Support patient and family/caregiver active participation in decision-making and self-management plan.  Implement additional goals and interventions based on identified risk factors to reduce health risk.  Facilitate advance care planning.  Review need for preventive screening based on age, sex, family history and health history.   Notes:     Task: Mutually Develop and Royce Macadamia Achievement of Patient Goals   Due Date: 07/27/2021  Priority: Routine  Responsible User: [name], RN  Note:   Care Management Activities:    - collaboration with team encouraged - questions answered - reassurance provided    Notes: 05/27/21 Patient with recent left BKA after foot wound infection.  Patient reports wound is dressed with gauze. He denies any redness, swelling or drainage.  Home health involved. Wound Management Discussed Monitor for swelling, redness, pain, pus, and fever Keep wound clean and dry.   Notify physician immediately for any changes  06/08/21 Patient denies redness or swelling to left stump site. Home health continues with dressing changes. Reviewed with patient signs of infection.   06/23/21 Patient denies signs of infection.  Home health continues for dressing changes and therapy.  Reiterated signs of infection.         Problem Identified: Health Promotion as evidenced by patient monitoring left leg BKA for infection   Priority: High  Onset Date: 05/27/2021     Goal: Self-Management Plan Developed as evidenced by patient left heel wound improvement Completed 03/26/2021  Start Date: 11/25/2020  Expected End Date: 05/27/2021  Recent Progress: On track  Priority:  High  Note:   Discussed signs of infection with patient.  Encouraged patient to ask questions and follow guidance from providers.     Notes: 12/03/20 patient reports that MD pleased with wound progress but wants patient to wear wound vac another two weeks.   12/09/20 Patient reports that wound nurse states that wound is much better. 12/22/20 Wound vac continues. 01/15/21 Patient reports wound is closing up.  Discussed wound infection symptoms and not to bear weight on his heel.  He verbalized understanding.  02/19/21 Wound closing up. No longer with wound vac.  Dressing change now weekly with home health.  Discussed signs of infection.   03/26/21 Wound to heel completely healed.  Advised to keep pressure off wound.      Task: Mutually Develop and Royce Macadamia Achievement of Patient Goals Completed 03/26/2021  Due Date: 06/26/2021  Responsible User: [name], RN  Note:   Care Management Activities:    - collaboration with team encouraged - questions answered - reassurance provided    Notes: Patient has wound vac to left heel presently. Patient to follow up with Dr. Vickki Muff.   01/15/21 Reiterated signs of  infection and no weightbearing on heel.   12/09/20 Patient continues to have wound vac. Wound making progress.   12/22/20 [place] continues to have wound vac dressing changes MWF by HHN.  Patient reports wound making progress per home health.  02/19/21 Wound vac discontinued.  Discussed signs of infection.  Weekly dressing changes.      Patient Care Plan: Diabetes Type 2 (Adult)     Problem Identified: Glycemic Management (Diabetes, Type 2)      Long-Range Goal: Glycemic Management Optimized as evidenced by A1c less than 8.5   Start Date: 11/25/2020  Expected End Date: 03/26/2022  This Visit's Progress: On track  Recent Progress: On track  Priority: Medium  Note:   Evidence-based guidance:  Promote self-monitoring of blood glucose levels.  Assess and address barriers to management plan, such  as food insecurity, age,  developmental ability, depression, anxiety, fear of hypoglycemia or weight gain,  as well as medication cost, side effects and complicated regimen.  Notes: 01/15/21 Patient still not checking sugars. Discussed rationale for checking sugars.      Task: Alleviate Barriers to Glycemic Management   Due Date: 03/26/2022  Priority: Routine  Responsible User: [name], RN  Note:   Care Management Activities:    - barriers to adherence to treatment plan identified - blood glucose monitoring encouraged - use of blood glucose monitoring log promoted    Notes: Patient has not checked blood sugar since being at home yet.  Encouraged patient to check sugars regularly. 01/15/21 Patient still not checking sugars.  Denies any barriers to checking sugars. CM continues to encourage to check sugars.  12/22/20 Patient still not checking sugars regularly.  Patient reports no barriers to checking sugars such as machine not working or supplies or inability to check.  02/19/21 Patient continues not to check sugars.  Encouraged to check. Discussed carbohydrate limiting.  A1c 7.1. 05/27/21 Patient last sugar check 202. Patient checks sugar at least weekly. Discussed importance of monitoring sugar regularly.   Diabetes Management Discussed: Medication adherence Reviewed importance of limiting carbs such as rice, potatoes, breads, and pastas. Also discussed limiting sweets and sugary drinks.  Discussed importance of portion control.  Also discussed importance of annual exams, foot exams, and eye exams.   06/08/21 Patient sugar this am was in the 200's per patient. He could not remember the exact number but states he had already eaten when he checked it.  Stressed importance of diabetes management in wound healing. Patient verbalized understanding.  06/23/21 Patient checking sugars at least daily reports blood sugars 100-150.  A1c 6.9.  Encouraged patient to keep up the great work with his  diabetes.     Patient Care Plan: General Pharmacy (Adult)     Problem Identified: HTN, PAD, SVT, GERD, Type II DM, Lipids, Arthritis   Priority: High  Onset Date: 02/17/2021     Long-Range Goal: Patient-Specific Goal   Start Date: 02/17/2021  Expected End Date: 08/20/2021  Recent Progress: On track  Priority: High  Note:   Current Barriers:  Unable to independently afford treatment regimen Unable to independently monitor therapeutic efficacy  Pharmacist Clinical Goal(s):  Patient will verbalize ability to afford treatment regimen maintain control of BP as evidenced by home monitoring  adhere to plan to optimize therapeutic regimen for asthma as evidenced by report of adherence to recommended medication management changes contact provider office for questions/concerns as evidenced notation of same in electronic health record through collaboration with PharmD and provider.   Interventions:  1:1 collaboration with [name], MD regarding development and update of comprehensive plan of care as evidenced by provider attestation and co-signature Inter-disciplinary care team collaboration (see longitudinal plan of care) Comprehensive medication review performed; medication list updated in electronic medical record  Hypertension/SVT (BP goal <130/80) -Controlled -Current treatment: Toprol XL 50mg  daily -Medications previously tried: Lisinopril (low BP)  -Current home readings: not checking -Current dietary habits: nothing specific, trying to maintain weight, protein shakes for snacks -Current exercise habits: minimal, goes fishing etc. -Denies hypotensive/hypertensive symptoms -Educated on BP goals and benefits of medications for prevention of heart attack, stroke and kidney damage; Daily salt intake goal < 2300 mg; Exercise goal of 150 minutes per week; Importance of home blood pressure monitoring; Symptoms of hypotension and importance of maintaining adequate  hydration; -Counseled to monitor BP at home at least once weekly or if symptomatic, document, and provide log at future appointments -Recently had low Bps and lisinopril was d/c, dose of Toprol XL was cut in half to 50mg  daily.  Has not had any dizziness since. -Recommended to continue current medication Recommended start to occasionally monitor BP at home.  Hyperlipidemia/PAD: (LDL goal < 70) -Controlled -Current treatment: Lovastatin 10mg  Clopidogrel 75mg  daily -Medications previously tried: none noted   -Educated on Cholesterol goals;  Benefits of statin for ASCVD risk reduction; Importance of limiting foods high in cholesterol; Exercise goal of 150 minutes per week;  Most recent LDL is excellent -Recommended to continue current medication  Diabetes (A1c goal <7%) -Uncontrolled -Current medications: Glyburide 5mg  BID with meals -Medications previously tried: [place]  -Current home glucose readings fasting glucose: not checking post prandial glucose: not checking -Denies hypoglycemic/hyperglycemic symptoms -Current meal patterns:  breakfast: oatmeal, protein shakes  lunch: inconsistent dinner:  snacks: nabs, protein shakes drinks: water mainly -Current exercise: minimal, fishing -Educated on A1c and blood sugar goals; Prevention and management of hypoglycemic episodes; Benefits of routine self-monitoring of blood sugar; Importance of eating with doses of glyburide and the potential for hypoglycemia if he does not eat -Counseled to check feet daily and get yearly eye exams -Recommended to continue current medication Recommended to monitor at least occasionally fasting and if he ever feels like it is low.  Encouraged to eat with ALL doses of glyburide.  Update 06/26/21 146, 127, 146, 140, 172 - fasting glucose recently Glucose continues to fluctuate.  His A1c improved at most recent MD visit! He denies any episodes of hypoglycemia since our last conversation.  At this  point I think he is doing well with his glucose overall.  Encouraged him to continue to work on diet modifications and remember to eat with his glyburide.  Continue current meds  Arthritis (Goal: Control symmptoms) -Controlled -Current treatment  Meloxicam 15mg  daily Tramadol 50mg  - hs -Medications previously tried: none noted -Counseled on avoidance of other NSAIDs and to take Tylenol if he needed to for breakthrough headache or other minor aches/pains  -Recommended to continue current medication  Asthma (Goal: Control symptoms) -Controlled -Current treatment  Dulera 100-26mcg -Medications previously tried: none noted  -Reports copay is high and he does not have this at home -Does mention another inhaler and not sure which he is taking.  He is to call me when he gets home to let me know which inhaler he is using.  Will look into PAP to see if there is some help with [facility] copay. -Recommended to continue current medication  Update 06/26/21 He is using inhaler daily.  He still has not  heard back from program regarding help with is copay for [facility].  We will follow up on application to [place].  If they cannot help with copay it may be an option to switch to Symbicort and apply for their patient assistance program.  Will follow up on response from [place].   Patient Goals/Self-Care Activities Patient will:  - take medications as prescribed focus on medication adherence by pill box check glucose when symptomatic, document, and provide at future appointments check blood pressure weekly, document, and provide at future appointments collaborate with provider on medication access solutions  Follow Up Plan: The care management team will reach out to the patient again over the next 120 days.            Patient verbalizes understanding of instructions provided today and agrees to view in [place].  Telephone follow up appointment with pharmacy team member scheduled for: 4  months  Edythe Clarity, [place]

## 2021-06-29 ENCOUNTER — Telehealth: Payer: Self-pay

## 2021-06-29 DIAGNOSIS — I471 Supraventricular tachycardia: Secondary | ICD-10-CM | POA: Diagnosis not present

## 2021-06-29 DIAGNOSIS — J449 Chronic obstructive pulmonary disease, unspecified: Secondary | ICD-10-CM | POA: Diagnosis not present

## 2021-06-29 DIAGNOSIS — E1169 Type 2 diabetes mellitus with other specified complication: Secondary | ICD-10-CM | POA: Diagnosis not present

## 2021-06-29 DIAGNOSIS — D649 Anemia, unspecified: Secondary | ICD-10-CM | POA: Diagnosis not present

## 2021-06-29 DIAGNOSIS — I10 Essential (primary) hypertension: Secondary | ICD-10-CM | POA: Diagnosis not present

## 2021-06-29 DIAGNOSIS — Z4781 Encounter for orthopedic aftercare following surgical amputation: Secondary | ICD-10-CM | POA: Diagnosis not present

## 2021-06-29 DIAGNOSIS — E11319 Type 2 diabetes mellitus with unspecified diabetic retinopathy without macular edema: Secondary | ICD-10-CM | POA: Diagnosis not present

## 2021-06-29 DIAGNOSIS — K811 Chronic cholecystitis: Secondary | ICD-10-CM | POA: Diagnosis not present

## 2021-06-29 DIAGNOSIS — M86172 Other acute osteomyelitis, left ankle and foot: Secondary | ICD-10-CM | POA: Diagnosis not present

## 2021-06-29 NOTE — Telephone Encounter (Signed)
Patient missed there in home care services with [facility] home health on 06-25-21 due to an appointment conflict.

## 2021-06-30 LAB — CULTURE, URINE COMPREHENSIVE

## 2021-07-04 DIAGNOSIS — J439 Emphysema, unspecified: Secondary | ICD-10-CM | POA: Diagnosis not present

## 2021-07-05 ENCOUNTER — Encounter (INDEPENDENT_AMBULATORY_CARE_PROVIDER_SITE_OTHER): Payer: Self-pay | Admitting: Nurse Practitioner

## 2021-07-05 NOTE — Progress Notes (Signed)
Subjective:    Patient ID: [name], male    DOB: [birth year], 70 y.o.   MRN: [id number] Chief Complaint  Patient presents with   Follow-up    3 wk wound check    [name] is a 70 year old male that presents today for follow-up evaluation of his left below-knee amputation.  The area of superficial wound is greatly decreased at this time.  There is only minimal bleeding now which continues to remain superficial.  Denies any foul-smelling or purulent drainage.  The patient does continue on the antibiotics that he was given at his last office visit.  He denies any fevers or chills.  He denies any other issues with his stump.   Review of Systems     Objective:   Physical Exam  BP (!) 146/80   Pulse 83   Ht $R'5\' 10"'RH$  (1.778 m)   Wt 141 lb (64 kg)   BMI 20.23 kg/m   Past Medical History:  Diagnosis Date   Cholecystitis, chronic    Cholelithiasis    COPD (chronic obstructive pulmonary disease) (HCC)    Diabetes mellitus without complication (HCC)    Diabetic retinopathy ([place])    Dyspnea    Fatty liver 2008   Fracture of clavicle 1992   left    Fracture of ribs, multiple 1992   3 ribs   GERD (gastroesophageal reflux disease)    Hyperlipidemia    Hypertension    Paroxysmal SVT (supraventricular tachycardia) (HCC)    Pelvis fracture ([place]) 1992   Peripheral vascular disease ([place])    Pleurisy    Pleurisy    Seasonal allergies     Social History   Socioeconomic History   Marital status: Single    Spouse name: Not on file   Number of children: Not on file   Years of education: Not on file   Highest education level: Not on file  Occupational History   Not on file  Tobacco Use   Smoking status: Former    Types: Cigarettes    Quit date: 02/24/1997    Years since quitting: 24.3   Smokeless tobacco: Never  Vaping Use   Vaping Use: Never used  Substance and Sexual Activity   Alcohol use: No   Drug use: No   Sexual activity: Not on file  Other Topics Concern    Not on file  Social History Narrative   Not on file   Social Determinants of Health   Financial Resource Strain: Low Risk    Difficulty of Paying Living Expenses: Not very hard  Food Insecurity: No Food Insecurity   Worried About Running Out of Food in the Last Year: Never true   Ran Out of Food in the Last Year: Never true  Transportation Needs: No Transportation Needs   Lack of Transportation (Medical): No   Lack of Transportation (Non-Medical): No  Physical Activity: Not on file  Stress: Not on file  Social Connections: Not on file  Intimate Partner Violence: Not on file    Past Surgical History:  Procedure Laterality Date   AMPUTATION Left 04/23/2021   Procedure: AMPUTATION BELOW KNEE;  Surgeon: [name], MD;  Location: [facility];  Service: General;  Laterality: Left;   AMPUTATION TOE Left 02/25/2017   Procedure: AMPUTATION TOE-LEFT 2ND MPJ;  Surgeon: Samara Deist, DPM;  Location: [facility];  Service: Podiatry;  Laterality: Left;   AMPUTATION TOE Left 10/20/2018   Procedure: TOE MPJ T2 LEFT;  Surgeon: Samara Deist, DPM;  Location: [facility];  Service: Podiatry;  Laterality: Left;   AMPUTATION TOE Left 01/12/2019   Procedure: AMPUTATION LEFT 5TH TOE AND JOINT;  Surgeon: Samara Deist, DPM;  Location: [facility];  Service: Podiatry;  Laterality: Left;   BACK SURGERY  2008   BONE BIOPSY Left 11/21/2020   Procedure: BONE BIOPSY;  Surgeon: Samara Deist, DPM;  Location: [facility];  Service: Podiatry;  Laterality: Left;   CHOLECYSTECTOMY     COLONOSCOPY WITH PROPOFOL N/A 08/15/2017   Procedure: COLONOSCOPY WITH PROPOFOL;  Surgeon: Lollie Sails, MD;  Location: [facility];  Service: Endoscopy;  Laterality: N/A;   ENDARTERECTOMY Right 05/12/2017   Procedure: ENDARTERECTOMY CAROTID;  Surgeon: [name], MD;  Location: [facility];  Service: Vascular;  Laterality: Right;   ENDARTERECTOMY FEMORAL Left 12/01/2018   Procedure: ENDARTERECTOMY FEMORAL;  Surgeon: [name], MD;   Location: [facility];  Service: Vascular;  Laterality: Left;   ERCP     FOOT SURGERY Right    x 3   IRRIGATION AND DEBRIDEMENT FOOT Left 11/21/2020   Procedure: IRRIGATION AND DEBRIDEMENT FOOT--with placement of wound vac;  Surgeon: Samara Deist, DPM;  Location: [facility];  Service: Podiatry;  Laterality: Left;   LOWER EXTREMITY ANGIOGRAM Left 12/01/2018   Procedure: LOWER EXTREMITY ANGIOGRAM;  Surgeon: [name], MD;  Location: [facility];  Service: Vascular;  Laterality: Left;   LOWER EXTREMITY ANGIOGRAPHY Left 10/30/2018   Procedure: LOWER EXTREMITY ANGIOGRAPHY;  Surgeon: [name], MD;  Location: [place] [facility];  Service: Cardiovascular;  Laterality: Left;   LOWER EXTREMITY ANGIOGRAPHY Left 11/30/2018   Procedure: LOWER EXTREMITY ANGIOGRAPHY;  Surgeon: [name], MD;  Location: [place] [facility];  Service: Cardiovascular;  Laterality: Left;   LOWER EXTREMITY ANGIOGRAPHY Left 11/17/2020   Procedure: Lower Extremity Angiography;  Surgeon: [name], MD;  Location: [place] [facility];  Service: Cardiovascular;  Laterality: Left;   LOWER EXTREMITY ANGIOGRAPHY Left 04/22/2021   Procedure: Lower Extremity Angiography;  Surgeon: [name], MD;  Location: [place] [facility];  Service: Cardiovascular;  Laterality: Left;   TEE WITHOUT CARDIOVERSION N/A 11/14/2020   Procedure: TRANSESOPHAGEAL ECHOCARDIOGRAM (TEE);  Surgeon: Corey Skains, MD;  Location: [facility];  Service: Cardiovascular;  Laterality: N/A;    Family History  Problem Relation Age of Onset   Diabetes Sister     Allergies  Allergen Reactions   Penicillins Rash and Other (See Comments)    Rash in and around the mouth Has patient had a PCN reaction causing immediate rash, facial/tongue/throat swelling, SOB or lightheadedness with hypotension: Yes Has patient had a PCN reaction causing severe rash involving mucus membranes or skin necrosis: Yes Has patient had a PCN reaction that required hospitalization: No Has  patient had a PCN reaction occurring within the last 10 years: Yes If all of the above answers are "NO", then may proceed with Cephalosporin use.    Bactrim [Sulfamethoxazole-Trimethoprim] Other (See Comments)    Mouth sores, Sores develop in mouth    CBC Latest Ref Rng & Units 04/26/2021 04/25/2021 04/25/2021  WBC 4.0 - 10.5 K/uL 4.6 - 3.6(L)  Hemoglobin 13.0 - 17.0 g/dL 9.0(L) 8.8(L) 7.3(L)  Hematocrit 39.0 - 52.0 % 28.0(L) 26.9(L) 23.1(L)  Platelets 150 - 400 K/uL 210 - 195      CMP     Component Value Date/Time   NA 135 04/26/2021 0559   NA 136 04/17/2014 1026   K 4.8 04/26/2021 0559   K  5.1 04/17/2014 1026   CL 103 04/26/2021 0559   CL 103 04/17/2014 1026   CO2 25 04/26/2021 0559   CO2 28 04/17/2014 1026   GLUCOSE 194 (H) 04/26/2021 0559   GLUCOSE 124 (H) 04/17/2014 1026   BUN 36 (H) 04/26/2021 0559   BUN 28 (H) 04/17/2014 1026   CREATININE 0.99 04/28/2021 0513   CREATININE 1.30 04/17/2014 1026   CALCIUM 8.4 (L) 04/26/2021 0559   CALCIUM 8.6 04/17/2014 1026   PROT 7.1 04/20/2021 1409   PROT 7.1 04/17/2014 1026   ALBUMIN 2.7 (L) 04/20/2021 1409   ALBUMIN 3.8 04/17/2014 1026   AST 17 04/20/2021 1409   AST 26 04/17/2014 1026   ALT 14 04/20/2021 1409   ALT 36 04/17/2014 1026   ALKPHOS 76 04/20/2021 1409   ALKPHOS 50 04/17/2014 1026   BILITOT 0.6 04/20/2021 1409   BILITOT 1.5 (H) 04/17/2014 1026   GFRNONAA >60 04/28/2021 0513   GFRNONAA 59 (L) 04/17/2014 1026   GFRAA >60 06/20/2020 1001   GFRAA >60 04/17/2014 1026     No results found.     Assessment & Plan:   1. Left below-knee amputee [facility]) The patient's amputation site is healing very well.  At this point it is reasonable to move forward with a prosthesis.  Was seen for further evaluation for left below knee prosthesis.  [name] is a man who had amputation on 04/23/2021.  At the time he is well healed and ready for fitting of new blood knee prosthesis.  He is a highly motivated individual and should do  well once fitted with prosthesis.  She has no problem returning to a K2 ambulator, which will allow him to walk inside and outside of his home and overload level barriers.   2. Mixed hyperlipidemia Continue statin as ordered and reviewed, no changes at this time   3. Type 2 diabetes mellitus with hyperglycemia, without long-term current use of insulin (HCC) Continue hypoglycemic medications as already ordered, these medications have been reviewed and there are no changes at this time.  Hgb A1C to be monitored as already arranged by primary service    Current Outpatient Medications on File Prior to Visit  Medication Sig Dispense Refill   acetaminophen (TYLENOL) 325 MG tablet Take 650 mg by mouth every 6 (six) hours as needed for moderate pain.     aspirin EC 81 MG tablet Take 81 mg by mouth daily.     Blood Glucose Calibration (TRUE METRIX LEVEL 1) Low SOLN Use as directed when needed dx E11.65 1 each 0   Blood Glucose Monitoring Suppl (TRUE METRIX GO GLUCOSE METER) w/Device KIT Use as directed to check glucose DX e11.65 1 kit 0   Calcium Carbonate-Vitamin D (CALCIUM PLUS VITAMIN D PO) Take 1 tablet by mouth daily.     clopidogrel (PLAVIX) 75 MG tablet Take 1 tablet (75 mg total) by mouth daily. 90 tablet 3   Continuous Blood Gluc Receiver (DEXCOM G6 RECEIVER) DEVI Use as directed every 14 days to monitor blood sugars 3 each 3   Continuous Blood Gluc Sensor (DEXCOM G6 SENSOR) MISC Use as directed every 14 days to monitor blood sugars 3 each 3   Continuous Blood Gluc Transmit (DEXCOM G6 TRANSMITTER) MISC Use as directed every 14 days to monitor blood sugars 3 each 3   cyclobenzaprine (FLEXERIL) 10 MG tablet Take 1 tablet (10 mg total) by mouth 3 (three) times daily as needed for muscle spasms. 270 tablet 1   diphenhydrAMINE (  BENADRYL) 25 mg capsule Take 25 mg by mouth every 6 (six) hours as needed for allergies.     gabapentin (NEURONTIN) 300 MG capsule Take 1 capsule (300 mg total) by mouth 3  (three) times daily. 90 capsule 1   glucose blood (TRUE METRIX BLOOD GLUCOSE TEST) test strip TEST BLOOD SUGAR TWICE DAILY AS DIRECTED 200 strip 3   glyBURIDE (DIABETA) 5 MG tablet TAKE 1 TABLET TWICE DAILY WITH MEALS 180 tablet 1   Lancets (ACCU-CHEK SOFT TOUCH) lancets Use as instructed 100 each 12   lovastatin (MEVACOR) 10 MG tablet Take 1 tab  Po QHS 90 tablet 1   meloxicam (MOBIC) 15 MG tablet Take 15 mg by mouth daily.     metoprolol succinate (TOPROL-XL) 50 MG 24 hr tablet Take 1 tablet (50 mg total) by mouth daily. Take with or immediately following a meal. 90 tablet 1   mometasone-formoterol (DULERA) 100-5 MCG/ACT AERO Inhale 2 puffs into the lungs 2 (two) times daily as needed for wheezing. 1 each 3   nitrofurantoin, macrocrystal-monohydrate, (MACROBID) 100 MG capsule Take 1 cap twice per day for 10 days. 20 capsule 0   TRUEplus Lancets 30G MISC by Does not apply route. Use as directed twice a daily Dx E11.65     No current facility-administered medications on file prior to visit.    There are no Patient Instructions on file for this visit. No follow-ups on file.   Kris Hartmann, NP

## 2021-07-06 ENCOUNTER — Other Ambulatory Visit: Payer: Self-pay

## 2021-07-06 DIAGNOSIS — I10 Essential (primary) hypertension: Secondary | ICD-10-CM | POA: Diagnosis not present

## 2021-07-06 DIAGNOSIS — E1169 Type 2 diabetes mellitus with other specified complication: Secondary | ICD-10-CM | POA: Diagnosis not present

## 2021-07-06 DIAGNOSIS — I471 Supraventricular tachycardia: Secondary | ICD-10-CM | POA: Diagnosis not present

## 2021-07-06 DIAGNOSIS — Z4781 Encounter for orthopedic aftercare following surgical amputation: Secondary | ICD-10-CM | POA: Diagnosis not present

## 2021-07-06 DIAGNOSIS — K811 Chronic cholecystitis: Secondary | ICD-10-CM | POA: Diagnosis not present

## 2021-07-06 DIAGNOSIS — D649 Anemia, unspecified: Secondary | ICD-10-CM | POA: Diagnosis not present

## 2021-07-06 DIAGNOSIS — J449 Chronic obstructive pulmonary disease, unspecified: Secondary | ICD-10-CM | POA: Diagnosis not present

## 2021-07-06 DIAGNOSIS — E11319 Type 2 diabetes mellitus with unspecified diabetic retinopathy without macular edema: Secondary | ICD-10-CM | POA: Diagnosis not present

## 2021-07-06 DIAGNOSIS — M86172 Other acute osteomyelitis, left ankle and foot: Secondary | ICD-10-CM | POA: Diagnosis not present

## 2021-07-06 MED ORDER — CIPROFLOXACIN HCL 500 MG PO TABS: 500.0000 mg | ORAL_TABLET | Freq: Two times a day (BID) | ORAL | 0 refills | Status: DC

## 2021-07-06 NOTE — Telephone Encounter (Signed)
Home Health Nurse from [facility] called lmom Carmell [place] [phone]) advised that pt was finished with macrobid and he is now having a burning with urination,(intermittent) pt related it to feeling like fire.  Per Ander Purpura we will send in cipro 500 Mg BID for 10 days and a verbal order was given to [place] for a UA and culture.  Also faxed an order to ATTN: Feliberto Gottron at [phone].  LMOM to inform pt of abx sent to pharmacy got no answer.

## 2021-07-08 ENCOUNTER — Other Ambulatory Visit: Payer: Self-pay

## 2021-07-08 NOTE — Patient Outreach (Signed)
[place] [facility]) Care Management  07/08/2021  JAHKAI YANDELL 1950/11/16 [id number]   Telephone call to patient.  No answer.  HIPAA compliant voice message left.    Plan: RN CM will attempt again in the next 4 business days.   [name], RN, MSN [place] Management Care Management Coordinator Direct Line [phone] Cell [phone] Toll Free: [phone]  Fax: [phone]

## 2021-07-09 ENCOUNTER — Other Ambulatory Visit: Payer: Self-pay

## 2021-07-09 DIAGNOSIS — D649 Anemia, unspecified: Secondary | ICD-10-CM | POA: Diagnosis not present

## 2021-07-09 DIAGNOSIS — K811 Chronic cholecystitis: Secondary | ICD-10-CM | POA: Diagnosis not present

## 2021-07-09 DIAGNOSIS — I471 Supraventricular tachycardia: Secondary | ICD-10-CM | POA: Diagnosis not present

## 2021-07-09 DIAGNOSIS — Z4781 Encounter for orthopedic aftercare following surgical amputation: Secondary | ICD-10-CM | POA: Diagnosis not present

## 2021-07-09 DIAGNOSIS — E11319 Type 2 diabetes mellitus with unspecified diabetic retinopathy without macular edema: Secondary | ICD-10-CM | POA: Diagnosis not present

## 2021-07-09 DIAGNOSIS — I10 Essential (primary) hypertension: Secondary | ICD-10-CM | POA: Diagnosis not present

## 2021-07-09 DIAGNOSIS — M86172 Other acute osteomyelitis, left ankle and foot: Secondary | ICD-10-CM | POA: Diagnosis not present

## 2021-07-09 DIAGNOSIS — J449 Chronic obstructive pulmonary disease, unspecified: Secondary | ICD-10-CM | POA: Diagnosis not present

## 2021-07-09 DIAGNOSIS — R309 Painful micturition, unspecified: Secondary | ICD-10-CM | POA: Diagnosis not present

## 2021-07-09 DIAGNOSIS — E1169 Type 2 diabetes mellitus with other specified complication: Secondary | ICD-10-CM | POA: Diagnosis not present

## 2021-07-09 NOTE — Patient Instructions (Signed)
Goals Addressed               This Visit's Progress     Careful Skin Care-incision to left BKA site (pt-stated)   On track     Barriers: Knowledge Timeframe:  Short-Term Goal Priority:  High Start Date:   11/25/20                          Expected End Date:  08/26/21      Follow Up Date  08/26/21 - clean and dry skin well  -keep dressing intact.  -monitor for signs such as redness, swelling or pain to site   Why is this important?    Taking really good care of your skin will help to keep your skin unbroken.    Notes: 11/25/20 Patient reports wound vac intact with no redness or swelling.  12/03/20 Patient to wear wound vac for 2 more weeks. 12/09/20 Wound making progress per patient 12/22/20 Wound making progress 01/15/21 Patient reports some wound closure.  Patient recently saw podiatrist.   02/19/21 Wound vac discontinued.  Wound clean and dry.  Home health changing dressing weekly per patient. 03/26/21 Wound to heel completely healed.  Advised to keep pressure off wound.   05/27/21 Patient with new left BKA incision site. Patient reports area with no redness or drainage. Home health involved.  Patient reports being independent with basic ADL's. Daughter helps as needed.  Wound Management Discussed Monitor for swelling, redness, pain, pus, and fever Keep wound clean and dry.   Notify physician immediately for any changes  06/08/21 Patient reports his left BKA site is doing good.  No redness swelling or drainage. [facility] continues with dressing changes and wound care. Due from a visit this morning per patient. Reviewed wound management. He verbalized understanding.   06/23/21 Patient denies signs of infection.  Home health continues with dressing changes. Reiterated signs of infection.   07/09/21 Patient proud of amputation site progress with only one scabbed over site.  Follow up with surgeon coming up.  Reviewed infection signs.        Monitor and Manage My Blood Sugar-Diabetes Type 2  (pt-stated)   On track     Barriers: Health Behaviors Knowledge Timeframe:  Long-Range Goal Priority:  High Start Date:    11/25/20                         Expected End Date:   03/26/22 Follow Up Date  08/26/21  - check blood sugar at prescribed times    Why is this important?   Checking your blood sugar at home helps to keep it from getting very high or very low.  Writing the results in a diary or log helps the doctor know how to care for you.  Your blood sugar log should have the time, date and the results.  Also, write down the amount of insulin or other medicine that you take.  Other information, like what you ate, exercise done and how you were feeling, will also be helpful.     Notes: Patient currently not checking encouraged to check. 12/03/20 Patient not checking sugars currently. Encouraged patient to check sugars. 12/09/20 Patient checked blood sugar it was 223.   12/22/20  PLEASE CHECK YOUR BLOOD SUGARS. 01/15/21 Please check your sugars.   02/17/21 Not checking sugars at home.  "Hard to bleed"  02/19/21 Not checking sugars. He states he does  not bleed.  Discussed methods to promote bleeding. 03/26/21 Patient reports checking sugars now.  Encouraged to continue.  Blood sugar this am was 190.   Diabetes Management Discussed: Medication adherence Reviewed importance of limiting carbs such as rice, potatoes, breads, and pastas. Also discussed limiting sweets and sugary drinks.  Discussed importance of portion control.  Also discussed importance of annual exams, foot exams, and eye exams.   05/27/21 Patient reports last sugar check was 202.  Reviewed diabetes management and importance. Patient checks sugars at least weekly.  Diabetes Management Discussed: Medication adherence Reviewed importance of limiting carbs such as rice, potatoes, breads, and pastas. Also discussed limiting sweets and sugary drinks.  Discussed importance of portion control.  Also discussed importance of annual exams,  foot exams, and eye exams.   06/08/21 Patient reports sugar this am was in the 200's. He could not remember exact number but states he had already eaten. Discussed checking sugar prior to eating in the morning. He verbalized understanding.  Reviewed diabetes management and importance for wound healing.  He verbalized understanding.   06/23/21 Patient checking sugars at least daily.  Sugar ranging recently 100-150. Discussed diabetes management.  Keep up the great work!! 07/09/21 Patient continues checking sugars daily.  One report of a blood sugar in the 200's  Discussed the importance of good diabetes management and diet control.  He verbalized understanding.

## 2021-07-09 NOTE — Patient Outreach (Signed)
[facility]) Care Management  [place]  07/09/2021   [name] 1950-12-08 [id number]  Subjective: Telephone call to patient for follow up. He is doing good. Left stump site almost healed.  Home health nurse 2/week.  Diabetes management continues.  Objective:   Encounter Medications:  Outpatient Encounter Medications as of 07/09/2021  Medication Sig   acetaminophen (TYLENOL) 325 MG tablet Take 650 mg by mouth every 6 (six) hours as needed for moderate pain.   aspirin EC 81 MG tablet Take 81 mg by mouth daily.   Blood Glucose Calibration (TRUE METRIX LEVEL 1) Low SOLN Use as directed when needed dx E11.65   Blood Glucose Monitoring Suppl (TRUE METRIX GO GLUCOSE METER) w/Device KIT Use as directed to check glucose DX e11.65   Calcium Carbonate-Vitamin D (CALCIUM PLUS VITAMIN D PO) Take 1 tablet by mouth daily.   ciprofloxacin (CIPRO) 500 MG tablet Take 1 tablet (500 mg total) by mouth 2 (two) times daily.   clopidogrel (PLAVIX) 75 MG tablet Take 1 tablet (75 mg total) by mouth daily.   Continuous Blood Gluc Receiver (DEXCOM G6 RECEIVER) DEVI Use as directed every 14 days to monitor blood sugars   Continuous Blood Gluc Sensor (DEXCOM G6 SENSOR) MISC Use as directed every 14 days to monitor blood sugars   Continuous Blood Gluc Transmit (DEXCOM G6 TRANSMITTER) MISC Use as directed every 14 days to monitor blood sugars   cyclobenzaprine (FLEXERIL) 10 MG tablet Take 1 tablet (10 mg total) by mouth 3 (three) times daily as needed for muscle spasms.   diphenhydrAMINE (BENADRYL) 25 mg capsule Take 25 mg by mouth every 6 (six) hours as needed for allergies.   gabapentin (NEURONTIN) 300 MG capsule Take 1 capsule (300 mg total) by mouth 3 (three) times daily.   glucose blood (TRUE METRIX BLOOD GLUCOSE TEST) test strip TEST BLOOD SUGAR TWICE DAILY AS DIRECTED   glyBURIDE (DIABETA) 5 MG tablet TAKE 1 TABLET TWICE DAILY WITH MEALS   Lancets (ACCU-CHEK SOFT TOUCH)  lancets Use as instructed   lovastatin (MEVACOR) 10 MG tablet Take 1 tab  Po QHS   meloxicam (MOBIC) 15 MG tablet Take 15 mg by mouth daily.   metoprolol succinate (TOPROL-XL) 50 MG 24 hr tablet Take 1 tablet (50 mg total) by mouth daily. Take with or immediately following a meal.   mometasone-formoterol (DULERA) 100-5 MCG/ACT AERO Inhale 2 puffs into the lungs 2 (two) times daily as needed for wheezing.   nitrofurantoin, macrocrystal-monohydrate, (MACROBID) 100 MG capsule Take 1 cap twice per day for 10 days.   TRUEplus Lancets 30G MISC by Does not apply route. Use as directed twice a daily Dx E11.65   No facility-administered encounter medications on file as of 07/09/2021.    Functional Status:  In your present state of health, do you have any difficulty performing the following activities: 06/22/2021 05/27/2021  Hearing? N N  Vision? N N  Difficulty concentrating or making decisions? N N  Walking or climbing stairs? Y Y  Comment - left BKA  Dressing or bathing? N N  Doing errands, shopping? Y Y  Comment - step daughter assists  Preparing Food and eating ? N N  Using the Toilet? N N  In the past six months, have you accidently leaked urine? N N  Do you have problems with loss of bowel control? N N  Managing your Medications? N Y  Comment - step daughter assists  Managing your Finances? N Y  Comment -  step daughter assists  Housekeeping or managing your Housekeeping? Y Y  Comment - step daughter assists  Some recent data might be hidden    Fall/Depression Screening: Fall Risk  06/22/2021 05/27/2021 04/02/2021  Falls in the past year? 0 0 0  Number falls in past yr: - - -  Injury with Fall? - - -  Risk for fall due to : No Fall Risks - No Fall Risks  Follow up Falls evaluation completed - Falls evaluation completed   PHQ 2/9 Scores 06/22/2021 05/27/2021 04/02/2021 03/26/2021 01/28/2021 12/30/2020 11/25/2020  PHQ - 2 Score 0 0 0 0 0 0 0    Assessment:   Care Plan Care Plan : General  Plan of Care (Adult)  Updates made by Fleeta Emmer, RN since 07/09/2021 12:00 AM     Problem: [facility] (General Plan of Care)      Goal: Self-Management Plan Developed as evidenced by patient monitoring left BKA site for redness, swelling and drainage.   Start Date: 05/27/2021  Expected End Date: 08/26/2021  This Visit's Progress: On track  Recent Progress: On track  Priority: High  Note:   Evidence-based guidance:  Review biopsychosocial determinants of health screens.  Determine level of modifiable health risk.  Assess level of patient activation, level of readiness, importance and confidence to make changes.  Evoke change talk using open-ended questions, pros and cons, as well as looking forward.  Identify areas where behavior change may lead to improved health.  Partner with patient to develop a robust self-management plan that includes lifestyle factors, such as weight loss, exercise and healthy nutrition, as well as goals specific to disease risks.  Support patient and family/caregiver active participation in decision-making and self-management plan.  Implement additional goals and interventions based on identified risk factors to reduce health risk.  Facilitate advance care planning.  Review need for preventive screening based on age, sex, family history and health history.   Notes:     Task: Mutually Develop and Malen Gauze Achievement of Patient Goals   Due Date: 07/27/2021  Priority: Routine  Responsible User: Fleeta Emmer, RN  Note:   Care Management Activities:    - collaboration with team encouraged - questions answered - reassurance provided    Notes: 05/27/21 Patient with recent left BKA after foot wound infection.  Patient reports wound is dressed with gauze. He denies any redness, swelling or drainage.  Home health involved. Wound Management Discussed Monitor for swelling, redness, pain, pus, and fever Keep wound clean and dry.   Notify physician  immediately for any changes  06/08/21 Patient denies redness or swelling to left stump site. Home health continues with dressing changes. Reviewed with patient signs of infection.   06/23/21 Patient denies signs of infection.  Home health continues for dressing changes and therapy.  Reiterated signs of infection.     07/09/21 Patient reports that left leg amputation site is just about healed. He reports only on spot with a scab and it is about to fall off.  Appointment on 07/15/21 with surgeon and patient is hopeful for talk about prothesis.  Reviewed signs of infection.  No concerns.       Care Plan : Diabetes Type 2 (Adult)  Updates made by Fleeta Emmer, RN since 07/09/2021 12:00 AM     Problem: Glycemic Management (Diabetes, Type 2)      Long-Range Goal: Glycemic Management Optimized as evidenced by A1c less than 8.5   Start Date: 11/25/2020  Expected End Date: 03/26/2022  This Visit's Progress: On track  Recent Progress: On track  Priority: Medium  Note:   Evidence-based guidance:  Promote self-monitoring of blood glucose levels.  Assess and address barriers to management plan, such as food insecurity, age,  developmental ability, depression, anxiety, fear of hypoglycemia or weight gain,  as well as medication cost, side effects and complicated regimen.  Notes: 01/15/21 Patient still not checking sugars. Discussed rationale for checking sugars.      Task: Alleviate Barriers to Glycemic Management   Due Date: 03/26/2022  Priority: Routine  Responsible User: Fleeta Emmer, RN  Note:   Care Management Activities:    - barriers to adherence to treatment plan identified - blood glucose monitoring encouraged - use of blood glucose monitoring log promoted    Notes: Patient has not checked blood sugar since being at home yet.  Encouraged patient to check sugars regularly. 01/15/21 Patient still not checking sugars.  Denies any barriers to checking sugars. CM continues to encourage to  check sugars.  12/22/20 Patient still not checking sugars regularly.  Patient reports no barriers to checking sugars such as machine not working or supplies or inability to check.  02/19/21 Patient continues not to check sugars.  Encouraged to check. Discussed carbohydrate limiting.  A1c 7.1. 05/27/21 Patient last sugar check 202. Patient checks sugar at least weekly. Discussed importance of monitoring sugar regularly.   Diabetes Management Discussed: Medication adherence Reviewed importance of limiting carbs such as rice, potatoes, breads, and pastas. Also discussed limiting sweets and sugary drinks.  Discussed importance of portion control.  Also discussed importance of annual exams, foot exams, and eye exams.   06/08/21 Patient sugar this am was in the 200's per patient. He could not remember the exact number but states he had already eaten when he checked it.  Stressed importance of diabetes management in wound healing. Patient verbalized understanding.  06/23/21 Patient checking sugars at least daily reports blood sugars 100-150.  A1c 6.9.  Encouraged patient to keep up the great work with his diabetes.  07/09/21 Patient checking sugars regularly.  He reports one in the 200's.  Discussed the importance of good diabetes management.  He verbalized understanding.        Goals Addressed               This Visit's Progress     Careful Skin Care-incision to left BKA site (pt-stated)   On track     Barriers: Knowledge Timeframe:  Short-Term Goal Priority:  High Start Date:   11/25/20                          Expected End Date:  08/26/21      Follow Up Date  08/26/21 - clean and dry skin well  -keep dressing intact.  -monitor for signs such as redness, swelling or pain to site   Why is this important?    Taking really good care of your skin will help to keep your skin unbroken.    Notes: 11/25/20 Patient reports wound vac intact with no redness or swelling.  12/03/20 Patient to wear wound vac  for 2 more weeks. 12/09/20 Wound making progress per patient 12/22/20 Wound making progress 01/15/21 Patient reports some wound closure.  Patient recently saw podiatrist.   02/19/21 Wound vac discontinued.  Wound clean and dry.  Home health changing dressing weekly per patient. 03/26/21 Wound to heel completely healed.  Advised to keep pressure off wound.  05/27/21 Patient with new left BKA incision site. Patient reports area with no redness or drainage. Home health involved.  Patient reports being independent with basic ADL's. Daughter helps as needed.  Wound Management Discussed Monitor for swelling, redness, pain, pus, and fever Keep wound clean and dry.   Notify physician immediately for any changes  06/08/21 Patient reports his left BKA site is doing good.  No redness swelling or drainage. [facility] continues with dressing changes and wound care. Due from a visit this morning per patient. Reviewed wound management. He verbalized understanding.   06/23/21 Patient denies signs of infection.  Home health continues with dressing changes. Reiterated signs of infection.   07/09/21 Patient proud of amputation site progress with only one scabbed over site.  Follow up with surgeon coming up.  Reviewed infection signs.        Monitor and Manage My Blood Sugar-Diabetes Type 2 (pt-stated)   On track     Barriers: Health Behaviors Knowledge Timeframe:  Long-Range Goal Priority:  High Start Date:    11/25/20                         Expected End Date:   03/26/22 Follow Up Date  08/26/21  - check blood sugar at prescribed times    Why is this important?   Checking your blood sugar at home helps to keep it from getting very high or very low.  Writing the results in a diary or log helps the doctor know how to care for you.  Your blood sugar log should have the time, date and the results.  Also, write down the amount of insulin or other medicine that you take.  Other information, like what you ate, exercise  done and how you were feeling, will also be helpful.     Notes: Patient currently not checking encouraged to check. 12/03/20 Patient not checking sugars currently. Encouraged patient to check sugars. 12/09/20 Patient checked blood sugar it was 223.   12/22/20  PLEASE CHECK YOUR BLOOD SUGARS. 01/15/21 Please check your sugars.   02/17/21 Not checking sugars at home.  "Hard to bleed"  02/19/21 Not checking sugars. He states he does not bleed.  Discussed methods to promote bleeding. 03/26/21 Patient reports checking sugars now.  Encouraged to continue.  Blood sugar this am was 190.   Diabetes Management Discussed: Medication adherence Reviewed importance of limiting carbs such as rice, potatoes, breads, and pastas. Also discussed limiting sweets and sugary drinks.  Discussed importance of portion control.  Also discussed importance of annual exams, foot exams, and eye exams.   05/27/21 Patient reports last sugar check was 202.  Reviewed diabetes management and importance. Patient checks sugars at least weekly.  Diabetes Management Discussed: Medication adherence Reviewed importance of limiting carbs such as rice, potatoes, breads, and pastas. Also discussed limiting sweets and sugary drinks.  Discussed importance of portion control.  Also discussed importance of annual exams, foot exams, and eye exams.   06/08/21 Patient reports sugar this am was in the 200's. He could not remember exact number but states he had already eaten. Discussed checking sugar prior to eating in the morning. He verbalized understanding.  Reviewed diabetes management and importance for wound healing.  He verbalized understanding.   06/23/21 Patient checking sugars at least daily.  Sugar ranging recently 100-150. Discussed diabetes management.  Keep up the great work!! 07/09/21 Patient continues checking sugars daily.  One report of a blood sugar  in the 200's  Discussed the importance of good diabetes management and diet control.  He  verbalized understanding.         Plan:  Follow-up: Patient agrees to Care Plan and Follow-up. Follow-up in 4-6 week(s)  [name], RN, MSN [place] [facility] Care Management Coordinator Direct Line [phone] Cell [phone] Toll Free: [phone]  Fax: [phone]

## 2021-07-10 ENCOUNTER — Ambulatory Visit: Payer: Self-pay

## 2021-07-13 DIAGNOSIS — I10 Essential (primary) hypertension: Secondary | ICD-10-CM | POA: Diagnosis not present

## 2021-07-13 DIAGNOSIS — E11319 Type 2 diabetes mellitus with unspecified diabetic retinopathy without macular edema: Secondary | ICD-10-CM | POA: Diagnosis not present

## 2021-07-13 DIAGNOSIS — E1169 Type 2 diabetes mellitus with other specified complication: Secondary | ICD-10-CM | POA: Diagnosis not present

## 2021-07-13 DIAGNOSIS — I471 Supraventricular tachycardia: Secondary | ICD-10-CM | POA: Diagnosis not present

## 2021-07-13 DIAGNOSIS — Z4781 Encounter for orthopedic aftercare following surgical amputation: Secondary | ICD-10-CM | POA: Diagnosis not present

## 2021-07-13 DIAGNOSIS — M86172 Other acute osteomyelitis, left ankle and foot: Secondary | ICD-10-CM | POA: Diagnosis not present

## 2021-07-13 DIAGNOSIS — K811 Chronic cholecystitis: Secondary | ICD-10-CM | POA: Diagnosis not present

## 2021-07-13 DIAGNOSIS — D649 Anemia, unspecified: Secondary | ICD-10-CM | POA: Diagnosis not present

## 2021-07-13 DIAGNOSIS — J449 Chronic obstructive pulmonary disease, unspecified: Secondary | ICD-10-CM | POA: Diagnosis not present

## 2021-07-15 ENCOUNTER — Ambulatory Visit (INDEPENDENT_AMBULATORY_CARE_PROVIDER_SITE_OTHER): Payer: Medicare HMO | Admitting: Nurse Practitioner

## 2021-07-15 ENCOUNTER — Encounter (INDEPENDENT_AMBULATORY_CARE_PROVIDER_SITE_OTHER): Payer: Medicare HMO

## 2021-07-15 ENCOUNTER — Other Ambulatory Visit: Payer: Self-pay

## 2021-07-15 ENCOUNTER — Other Ambulatory Visit (INDEPENDENT_AMBULATORY_CARE_PROVIDER_SITE_OTHER): Payer: Self-pay | Admitting: Vascular Surgery

## 2021-07-15 ENCOUNTER — Ambulatory Visit (INDEPENDENT_AMBULATORY_CARE_PROVIDER_SITE_OTHER): Payer: Medicare HMO

## 2021-07-15 VITALS — BP 126/73 | HR 88 | Ht 70.0 in | Wt 151.0 lb

## 2021-07-15 DIAGNOSIS — I6523 Occlusion and stenosis of bilateral carotid arteries: Secondary | ICD-10-CM

## 2021-07-15 DIAGNOSIS — Z89512 Acquired absence of left leg below knee: Secondary | ICD-10-CM

## 2021-07-15 DIAGNOSIS — I739 Peripheral vascular disease, unspecified: Secondary | ICD-10-CM

## 2021-07-15 DIAGNOSIS — Z9889 Other specified postprocedural states: Secondary | ICD-10-CM

## 2021-07-15 DIAGNOSIS — I10 Essential (primary) hypertension: Secondary | ICD-10-CM

## 2021-07-16 ENCOUNTER — Other Ambulatory Visit: Payer: Self-pay | Admitting: Internal Medicine

## 2021-07-16 DIAGNOSIS — Z4781 Encounter for orthopedic aftercare following surgical amputation: Secondary | ICD-10-CM | POA: Diagnosis not present

## 2021-07-16 DIAGNOSIS — J449 Chronic obstructive pulmonary disease, unspecified: Secondary | ICD-10-CM | POA: Diagnosis not present

## 2021-07-16 DIAGNOSIS — I471 Supraventricular tachycardia: Secondary | ICD-10-CM | POA: Diagnosis not present

## 2021-07-16 DIAGNOSIS — K811 Chronic cholecystitis: Secondary | ICD-10-CM | POA: Diagnosis not present

## 2021-07-16 DIAGNOSIS — E11319 Type 2 diabetes mellitus with unspecified diabetic retinopathy without macular edema: Secondary | ICD-10-CM | POA: Diagnosis not present

## 2021-07-16 DIAGNOSIS — E1169 Type 2 diabetes mellitus with other specified complication: Secondary | ICD-10-CM | POA: Diagnosis not present

## 2021-07-16 DIAGNOSIS — I10 Essential (primary) hypertension: Secondary | ICD-10-CM | POA: Diagnosis not present

## 2021-07-16 DIAGNOSIS — D649 Anemia, unspecified: Secondary | ICD-10-CM | POA: Diagnosis not present

## 2021-07-16 DIAGNOSIS — M86172 Other acute osteomyelitis, left ankle and foot: Secondary | ICD-10-CM | POA: Diagnosis not present

## 2021-07-19 NOTE — Progress Notes (Signed)
Subjective:    Patient ID: [name], male    DOB: [birth year], 70 y.o.   MRN: [id number] Chief Complaint  Patient presents with   Follow-up    6 Mo LE arterial abi    Mr. Blumenfeld is a 70 year old male that returns today for evaluation of his left below-knee amputation.  The patient previously had some dehiscence of his wound at work today it has completely healed.  He still continues to have some phantom limb pain.  He is doing well with physical therapy.  Today noninvasive studies show a right ABI of 1.19 with a TBI 0.71.  The patient has biphasic tibial artery waveforms on the right with good toe waveforms.  The patient is also here for his carotid duplex as well.  He denies any TIA or amaurosis fugax-like symptoms.  No recent CVA-like symptoms  The patient has a stenosis of 1 to 39% bilaterally.  The patient does have some plaque within the common carotid artery but it is greater than 50% but not drastically increased from previous studies on 05/20/2020.  Patient also has known left subclavian steal with monophasic waveforms.  There is no worsening symptoms.  The patient has normal flow hemodynamics in the right vertebral with an retrograde flow in the left.  There is a widely patent right carotid endarterectomy.   Review of Systems  Cardiovascular:  Negative for leg swelling.  Musculoskeletal:  Positive for gait problem.  Skin:  Negative for wound.  All other systems reviewed and are negative.     Objective:   Physical Exam Vitals reviewed.  HENT:     Head: Normocephalic.  Cardiovascular:     Rate and Rhythm: Normal rate.  Pulmonary:     Effort: Pulmonary effort is normal.  Musculoskeletal:     Left Lower Extremity: Left leg is amputated below knee.  Skin:    General: Skin is warm and dry.  Neurological:     Mental Status: He is alert and oriented to person, place, and time.  Psychiatric:        Mood and Affect: Mood normal.        Behavior: Behavior normal.         Thought Content: Thought content normal.        Judgment: Judgment normal.    BP 126/73   Pulse 88   Ht $R'5\' 10"'ct$  (1.778 m)   Wt 151 lb (68.5 kg)   BMI 21.67 kg/m   Past Medical History:  Diagnosis Date   Cholecystitis, chronic    Cholelithiasis    COPD (chronic obstructive pulmonary disease) (HCC)    Diabetes mellitus without complication (HCC)    Diabetic retinopathy ([place])    Dyspnea    Fatty liver 2008   Fracture of clavicle 1992   left    Fracture of ribs, multiple 1992   3 ribs   GERD (gastroesophageal reflux disease)    Hyperlipidemia    Hypertension    Paroxysmal SVT (supraventricular tachycardia) (HCC)    Pelvis fracture ([place]) 1992   Peripheral vascular disease ([place])    Pleurisy    Pleurisy    Seasonal allergies     Social History   Socioeconomic History   Marital status: Single    Spouse name: Not on file   Number of children: Not on file   Years of education: Not on file   Highest education level: Not on file  Occupational History   Not on file  Tobacco Use  Smoking status: Former    Types: Cigarettes    Quit date: 02/24/1997    Years since quitting: 24.4   Smokeless tobacco: Never  Vaping Use   Vaping Use: Never used  Substance and Sexual Activity   Alcohol use: No   Drug use: No   Sexual activity: Not on file  Other Topics Concern   Not on file  Social History Narrative   Not on file   Social Determinants of Health   Financial Resource Strain: Low Risk    Difficulty of Paying Living Expenses: Not very hard  Food Insecurity: No Food Insecurity   Worried About Charity fundraiser in the Last Year: Never true   Ran Out of Food in the Last Year: Never true  Transportation Needs: No Transportation Needs   Lack of Transportation (Medical): No   Lack of Transportation (Non-Medical): No  Physical Activity: Not on file  Stress: Not on file  Social Connections: Not on file  Intimate Partner Violence: Not on file    Past Surgical History:   Procedure Laterality Date   AMPUTATION Left 04/23/2021   Procedure: AMPUTATION BELOW KNEE;  Surgeon: [name], MD;  Location: [facility] ORS;  Service: General;  Laterality: Left;   AMPUTATION TOE Left 02/25/2017   Procedure: AMPUTATION TOE-LEFT 2ND MPJ;  Surgeon: Samara Deist, DPM;  Location: [facility] ORS;  Service: Podiatry;  Laterality: Left;   AMPUTATION TOE Left 10/20/2018   Procedure: TOE MPJ T2 LEFT;  Surgeon: Samara Deist, DPM;  Location: [facility];  Service: Podiatry;  Laterality: Left;   AMPUTATION TOE Left 01/12/2019   Procedure: AMPUTATION LEFT 5TH TOE AND JOINT;  Surgeon: Samara Deist, DPM;  Location: [facility];  Service: Podiatry;  Laterality: Left;   BACK SURGERY  2008   BONE BIOPSY Left 11/21/2020   Procedure: BONE BIOPSY;  Surgeon: Samara Deist, DPM;  Location: [facility];  Service: Podiatry;  Laterality: Left;   CHOLECYSTECTOMY     COLONOSCOPY WITH PROPOFOL N/A 08/15/2017   Procedure: COLONOSCOPY WITH PROPOFOL;  Surgeon: Lollie Sails, MD;  Location: [facility] ENDOSCOPY;  Service: Endoscopy;  Laterality: N/A;   ENDARTERECTOMY Right 05/12/2017   Procedure: ENDARTERECTOMY CAROTID;  Surgeon: [name], MD;  Location: [facility] ORS;  Service: Vascular;  Laterality: Right;   ENDARTERECTOMY FEMORAL Left 12/01/2018   Procedure: ENDARTERECTOMY FEMORAL;  Surgeon: [name], MD;  Location: [facility];  Service: Vascular;  Laterality: Left;   ERCP     FOOT SURGERY Right    x 3   IRRIGATION AND DEBRIDEMENT FOOT Left 11/21/2020   Procedure: IRRIGATION AND DEBRIDEMENT FOOT--with placement of wound vac;  Surgeon: Samara Deist, DPM;  Location: [facility];  Service: Podiatry;  Laterality: Left;   LOWER EXTREMITY ANGIOGRAM Left 12/01/2018   Procedure: LOWER EXTREMITY ANGIOGRAM;  Surgeon: [name], MD;  Location: [facility] ORS;  Service: Vascular;  Laterality: Left;   LOWER EXTREMITY ANGIOGRAPHY Left 10/30/2018   Procedure: LOWER EXTREMITY ANGIOGRAPHY;  Surgeon: [name], MD;  Location: [place]  CV LAB;  Service: Cardiovascular;  Laterality: Left;   LOWER EXTREMITY ANGIOGRAPHY Left 11/30/2018   Procedure: LOWER EXTREMITY ANGIOGRAPHY;  Surgeon: [name], MD;  Location: [place] [facility];  Service: Cardiovascular;  Laterality: Left;   LOWER EXTREMITY ANGIOGRAPHY Left 11/17/2020   Procedure: Lower Extremity Angiography;  Surgeon: [name], MD;  Location: [place] CV LAB;  Service: Cardiovascular;  Laterality: Left;   LOWER EXTREMITY ANGIOGRAPHY Left 04/22/2021   Procedure:  Lower Extremity Angiography;  Surgeon: [name], MD;  Location: [place] CV LAB;  Service: Cardiovascular;  Laterality: Left;   TEE WITHOUT CARDIOVERSION N/A 11/14/2020   Procedure: TRANSESOPHAGEAL ECHOCARDIOGRAM (TEE);  Surgeon: Corey Skains, MD;  Location: [facility] OR[facility];  Service: Cardiovascular;  Laterality: N/A;    Family History  Problem Relation Age of Onset   Diabetes Sister     Allergies  Allergen Reactions   Penicillins Rash and Other (See Comments)    Rash in and around the mouth Has patient had a PCN reaction causing immediate rash, facial/tongue/throat swelling, SOB or lightheadedness with hypotension: Yes Has patient had a PCN reaction causing severe rash involving mucus membranes or skin necrosis: Yes Has patient had a PCN reaction that required hospitalization: No Has patient had a PCN reaction occurring within the last 10 years: Yes If all of the above answers are "NO", then may proceed with Cephalosporin use.    Bactrim [Sulfamethoxazole-Trimethoprim] Other (See Comments)    Mouth sores, Sores develop in mouth    CBC Latest Ref Rng & Units 04/26/2021 04/25/2021 04/25/2021  WBC 4.0 - 10.5 K/uL 4.6 - 3.6(L)  Hemoglobin 13.0 - 17.0 g/dL 9.0(L) 8.8(L) 7.3(L)  Hematocrit 39.0 - 52.0 % 28.0(L) 26.9(L) 23.1(L)  Platelets 150 - 400 K/uL 210 - 195      CMP     Component Value Date/Time   NA 135 04/26/2021 0559   NA 136 04/17/2014 1026   K 4.8 04/26/2021 0559   K 5.1  04/17/2014 1026   CL 103 04/26/2021 0559   CL 103 04/17/2014 1026   CO2 25 04/26/2021 0559   CO2 28 04/17/2014 1026   GLUCOSE 194 (H) 04/26/2021 0559   GLUCOSE 124 (H) 04/17/2014 1026   BUN 36 (H) 04/26/2021 0559   BUN 28 (H) 04/17/2014 1026   CREATININE 0.99 04/28/2021 0513   CREATININE 1.30 04/17/2014 1026   CALCIUM 8.4 (L) 04/26/2021 0559   CALCIUM 8.6 04/17/2014 1026   PROT 7.1 04/20/2021 1409   PROT 7.1 04/17/2014 1026   ALBUMIN 2.7 (L) 04/20/2021 1409   ALBUMIN 3.8 04/17/2014 1026   AST 17 04/20/2021 1409   AST 26 04/17/2014 1026   ALT 14 04/20/2021 1409   ALT 36 04/17/2014 1026   ALKPHOS 76 04/20/2021 1409   ALKPHOS 50 04/17/2014 1026   BILITOT 0.6 04/20/2021 1409   BILITOT 1.5 (H) 04/17/2014 1026   GFRNONAA >60 04/28/2021 0513   GFRNONAA 59 (L) 04/17/2014 1026   GFRAA >60 06/20/2020 1001   GFRAA >60 04/17/2014 1026     No results found.     Assessment & Plan:   1. Left below-knee amputee [facility]) Mr. Spofford was seen for further evaluation for left below knee prosthesis.  Mr. Swiney is a 70 year old male who had amputation on 04/23/2021.  At the time he is well healed and ready for fitting of new below knee prosthesis.  He is a highly motivated individual and should do well once fitted with prosthesis.  She has no problem returning to a K2 ambulator, which will allow him to walk inside and outside of his home and overload level barriers.   2. Bilateral carotid artery stenosis Recommend:  Given the patient's asymptomatic subcritical stenosis no further invasive testing or surgery at this time.  The patient's known left subclavian steal is also stable.  Duplex ultrasound shows 1 to 39% stenosis stenosis bilaterally.  Continue antiplatelet therapy as prescribed Continue management of CAD, HTN and Hyperlipidemia Healthy  heart diet,  encouraged exercise at least 4 times per week Follow up in 12 months with duplex ultrasound and physical exam    3. Peripheral arterial  disease with history of revascularization (HCC)  Recommend:  The patient has evidence of atherosclerosis of the lower extremities with claudication.  The patient does not voice lifestyle limiting changes at this point in time.  Noninvasive studies do not suggest clinically significant change.  No invasive studies, angiography or surgery at this time The patient should continue walking and begin a more formal exercise program.  The patient should continue antiplatelet therapy and aggressive treatment of the lipid abnormalities  No changes in the patient's medications at this time  The patient should continue wearing graduated compression socks 10-15 mmHg strength to control the mild edema.   Patient will follow-up in 1 year with noninvasive studies. 4. Essential hypertension Continue antihypertensive medications as already ordered, these medications have been reviewed and there are no changes at this time.    Current Outpatient Medications on File Prior to Visit  Medication Sig Dispense Refill   acetaminophen (TYLENOL) 325 MG tablet Take 650 mg by mouth every 6 (six) hours as needed for moderate pain.     aspirin EC 81 MG tablet Take 81 mg by mouth daily.     Blood Glucose Calibration (TRUE METRIX LEVEL 1) Low SOLN Use as directed when needed dx E11.65 1 each 0   Blood Glucose Monitoring Suppl (TRUE METRIX GO GLUCOSE METER) w/Device KIT Use as directed to check glucose DX e11.65 1 kit 0   Calcium Carbonate-Vitamin D (CALCIUM PLUS VITAMIN D PO) Take 1 tablet by mouth daily.     ciprofloxacin (CIPRO) 500 MG tablet Take 1 tablet (500 mg total) by mouth 2 (two) times daily. 20 tablet 0   clopidogrel (PLAVIX) 75 MG tablet Take 1 tablet (75 mg total) by mouth daily. 90 tablet 3   Continuous Blood Gluc Receiver (DEXCOM G6 RECEIVER) DEVI Use as directed every 14 days to monitor blood sugars 3 each 3   Continuous Blood Gluc Sensor (DEXCOM G6 SENSOR) MISC Use as directed every 14 days to monitor  blood sugars 3 each 3   Continuous Blood Gluc Transmit (DEXCOM G6 TRANSMITTER) MISC Use as directed every 14 days to monitor blood sugars 3 each 3   cyclobenzaprine (FLEXERIL) 10 MG tablet Take 1 tablet (10 mg total) by mouth 3 (three) times daily as needed for muscle spasms. 270 tablet 1   diphenhydrAMINE (BENADRYL) 25 mg capsule Take 25 mg by mouth every 6 (six) hours as needed for allergies.     doxycycline (VIBRA-TABS) 100 MG tablet      glucose blood (TRUE METRIX BLOOD GLUCOSE TEST) test strip TEST BLOOD SUGAR TWICE DAILY AS DIRECTED 200 strip 3   glyBURIDE (DIABETA) 5 MG tablet TAKE 1 TABLET TWICE DAILY WITH MEALS 180 tablet 1   Lancets (ACCU-CHEK SOFT TOUCH) lancets Use as instructed 100 each 12   lovastatin (MEVACOR) 10 MG tablet Take 1 tab  Po QHS 90 tablet 1   meloxicam (MOBIC) 15 MG tablet Take 15 mg by mouth daily.     metoprolol succinate (TOPROL-XL) 50 MG 24 hr tablet Take 1 tablet (50 mg total) by mouth daily. Take with or immediately following a meal. 90 tablet 1   mometasone-formoterol (DULERA) 100-5 MCG/ACT AERO Inhale 2 puffs into the lungs 2 (two) times daily as needed for wheezing. 1 each 3   nitrofurantoin, macrocrystal-monohydrate, (MACROBID) 100 MG capsule Take  1 cap twice per day for 10 days. 20 capsule 0   pantoprazole (PROTONIX) 20 MG tablet      pantoprazole (PROTONIX) 40 MG tablet      traMADol (ULTRAM) 50 MG tablet      TRUEplus Lancets 30G MISC by Does not apply route. Use as directed twice a daily Dx E11.65     No current facility-administered medications on file prior to visit.    There are no Patient Instructions on file for this visit. No follow-ups on file.   Kris Hartmann, NP

## 2021-07-20 ENCOUNTER — Encounter (INDEPENDENT_AMBULATORY_CARE_PROVIDER_SITE_OTHER): Payer: Self-pay | Admitting: Nurse Practitioner

## 2021-07-20 DIAGNOSIS — J449 Chronic obstructive pulmonary disease, unspecified: Secondary | ICD-10-CM | POA: Diagnosis not present

## 2021-07-20 DIAGNOSIS — K811 Chronic cholecystitis: Secondary | ICD-10-CM | POA: Diagnosis not present

## 2021-07-20 DIAGNOSIS — D649 Anemia, unspecified: Secondary | ICD-10-CM | POA: Diagnosis not present

## 2021-07-20 DIAGNOSIS — Z4781 Encounter for orthopedic aftercare following surgical amputation: Secondary | ICD-10-CM | POA: Diagnosis not present

## 2021-07-20 DIAGNOSIS — E11319 Type 2 diabetes mellitus with unspecified diabetic retinopathy without macular edema: Secondary | ICD-10-CM | POA: Diagnosis not present

## 2021-07-20 DIAGNOSIS — I471 Supraventricular tachycardia: Secondary | ICD-10-CM | POA: Diagnosis not present

## 2021-07-20 DIAGNOSIS — M86172 Other acute osteomyelitis, left ankle and foot: Secondary | ICD-10-CM | POA: Diagnosis not present

## 2021-07-20 DIAGNOSIS — I10 Essential (primary) hypertension: Secondary | ICD-10-CM | POA: Diagnosis not present

## 2021-07-20 DIAGNOSIS — E1169 Type 2 diabetes mellitus with other specified complication: Secondary | ICD-10-CM | POA: Diagnosis not present

## 2021-07-22 ENCOUNTER — Telehealth (INDEPENDENT_AMBULATORY_CARE_PROVIDER_SITE_OTHER): Payer: Self-pay | Admitting: Nurse Practitioner

## 2021-07-22 ENCOUNTER — Telehealth: Payer: Self-pay

## 2021-07-22 DIAGNOSIS — Z89512 Acquired absence of left leg below knee: Secondary | ICD-10-CM | POA: Diagnosis not present

## 2021-07-23 ENCOUNTER — Other Ambulatory Visit: Payer: Self-pay | Admitting: Physician Assistant

## 2021-07-23 ENCOUNTER — Other Ambulatory Visit: Payer: Self-pay

## 2021-07-23 ENCOUNTER — Telehealth: Payer: Self-pay

## 2021-07-23 DIAGNOSIS — E1169 Type 2 diabetes mellitus with other specified complication: Secondary | ICD-10-CM | POA: Diagnosis not present

## 2021-07-23 DIAGNOSIS — I10 Essential (primary) hypertension: Secondary | ICD-10-CM | POA: Diagnosis not present

## 2021-07-23 DIAGNOSIS — K811 Chronic cholecystitis: Secondary | ICD-10-CM | POA: Diagnosis not present

## 2021-07-23 DIAGNOSIS — E11319 Type 2 diabetes mellitus with unspecified diabetic retinopathy without macular edema: Secondary | ICD-10-CM | POA: Diagnosis not present

## 2021-07-23 DIAGNOSIS — D649 Anemia, unspecified: Secondary | ICD-10-CM | POA: Diagnosis not present

## 2021-07-23 DIAGNOSIS — M86172 Other acute osteomyelitis, left ankle and foot: Secondary | ICD-10-CM | POA: Diagnosis not present

## 2021-07-23 DIAGNOSIS — Z4781 Encounter for orthopedic aftercare following surgical amputation: Secondary | ICD-10-CM | POA: Diagnosis not present

## 2021-07-23 DIAGNOSIS — J449 Chronic obstructive pulmonary disease, unspecified: Secondary | ICD-10-CM | POA: Diagnosis not present

## 2021-07-23 DIAGNOSIS — N390 Urinary tract infection, site not specified: Secondary | ICD-10-CM

## 2021-07-23 DIAGNOSIS — I471 Supraventricular tachycardia: Secondary | ICD-10-CM | POA: Diagnosis not present

## 2021-07-23 MED ORDER — CIPROFLOXACIN HCL 500 MG PO TABS: 500.0000 mg | ORAL_TABLET | Freq: Two times a day (BID) | ORAL | 0 refills | Status: DC

## 2021-07-23 NOTE — Telephone Encounter (Signed)
Pt  advised we send five more days antibiotic and we send referral for urology

## 2021-07-24 ENCOUNTER — Telehealth: Payer: Self-pay

## 2021-07-24 ENCOUNTER — Other Ambulatory Visit: Payer: Self-pay | Admitting: Physician Assistant

## 2021-07-24 DIAGNOSIS — M19012 Primary osteoarthritis, left shoulder: Secondary | ICD-10-CM

## 2021-07-24 MED ORDER — TRAMADOL HCL 50 MG PO TABS: 50.0000 mg | ORAL_TABLET | Freq: Two times a day (BID) | ORAL | 2 refills | Status: DC | PRN

## 2021-07-24 NOTE — Telephone Encounter (Signed)
Pt's daughter will return call to give information on tramadol prescription

## 2021-07-24 NOTE — Telephone Encounter (Signed)
Urology referral sent to [place] via [place] yesterday. Received call from daughter. She stated patient was confused as to who he has upcoming appointments with. I verified 09/24/21 appointment with [place]. I gave her telephone # for [facility]

## 2021-07-26 NOTE — Telephone Encounter (Signed)
error 

## 2021-08-04 ENCOUNTER — Ambulatory Visit: Payer: Medicare HMO | Admitting: Urology

## 2021-08-04 ENCOUNTER — Other Ambulatory Visit: Payer: Self-pay | Admitting: *Deleted

## 2021-08-04 ENCOUNTER — Other Ambulatory Visit
Admission: RE | Admit: 2021-08-04 | Discharge: 2021-08-04 | Disposition: A | Discharge status: Home / Self Care | Payer: Medicare HMO | Attending: Urology | Admitting: Urology

## 2021-08-04 ENCOUNTER — Encounter: Payer: Self-pay | Admitting: Urology

## 2021-08-04 ENCOUNTER — Other Ambulatory Visit: Payer: Self-pay

## 2021-08-04 VITALS — Ht 70.0 in | Wt 151.0 lb

## 2021-08-04 DIAGNOSIS — R310 Gross hematuria: Secondary | ICD-10-CM | POA: Insufficient documentation

## 2021-08-04 DIAGNOSIS — N390 Urinary tract infection, site not specified: Secondary | ICD-10-CM | POA: Diagnosis not present

## 2021-08-04 DIAGNOSIS — N481 Balanitis: Secondary | ICD-10-CM

## 2021-08-04 DIAGNOSIS — J439 Emphysema, unspecified: Secondary | ICD-10-CM | POA: Diagnosis not present

## 2021-08-04 LAB — URINALYSIS, COMPLETE (UACMP) WITH MICROSCOPIC
Bilirubin Urine: NEGATIVE
Glucose, UA: >1000 mg/dL — AB
Ketones, ur: NEGATIVE mg/dL
Nitrite: NEGATIVE
Protein, ur: >300 mg/dL — AB
Specific Gravity, Urine: 1.02 (ref 1.005–1.030)
WBC, UA: >50 WBC/hpf (ref 0–5)
pH: 7.5 (ref 5.0–8.0)

## 2021-08-04 LAB — BLADDER SCAN AMB NON-IMAGING

## 2021-08-04 MED ORDER — FLUCONAZOLE 100 MG PO TABS: 200.0000 mg | ORAL_TABLET | Freq: Every day | ORAL | 0 refills | Status: DC

## 2021-08-04 MED ORDER — NYSTATIN-TRIAMCINOLONE 100000-0.1 UNIT/GM-% EX CREA: 1.0000 "application " | TOPICAL_CREAM | Freq: Two times a day (BID) | CUTANEOUS | 0 refills | Status: DC

## 2021-08-04 NOTE — Progress Notes (Signed)
08/04/21 10:19 AM   [name] 05-15-1951 [id number]  CC: Urinary symptoms, UTI, dysuria  HPI: Extremely comorbid 70 year old male with diabetes(hemoglobin A1c 6.9), vascular disease, COPD who presents with 1 to 2 months of dysuria and urgency/frequency.  He was seen by PCP on 06/22/2021 and dipstick urinalysis showed large leukocytes, and culture grew > 100k mixed flora.  He was started on Cipro.  He did not have any improvement in his symptoms, and another prescription for Cipro was reportedly called in by PCP.  He continues to have dysuria and urgency/frequency.  He denies any gross hematuria.  CT in February 2022 showed no urologic abnormalities, and prostate measured 14 g.  Urinalysis today 0-5 squamous cells, moderate leukocytes, greater than 1000 glucose, > 50 WBCs, 6-10 RBCs, few bacteria, WBC clumps present, PVR is normal at 44 mL.  Urine sent for culture.  His daughter is here with him today and helps provide most of the history.  PMH: Past Medical History:  Diagnosis Date   Cholecystitis, chronic    Cholelithiasis    COPD (chronic obstructive pulmonary disease) ([place])    Diabetes mellitus without complication ([place])    Diabetic retinopathy ([place])    Dyspnea    Fatty liver 2008   Fracture of clavicle 1992   left    Fracture of ribs, multiple 1992   3 ribs   GERD (gastroesophageal reflux disease)    Hyperlipidemia    Hypertension    Paroxysmal SVT (supraventricular tachycardia) (HCC)    Pelvis fracture ([place]) 1992   Peripheral vascular disease ([place])    Pleurisy    Pleurisy    Seasonal allergies     Surgical History: Past Surgical History:  Procedure Laterality Date   AMPUTATION Left 04/23/2021   Procedure: AMPUTATION BELOW KNEE;  Surgeon: [name], MD;  Location: [facility];  Service: General;  Laterality: Left;   AMPUTATION TOE Left 02/25/2017   Procedure: AMPUTATION TOE-LEFT 2ND MPJ;  Surgeon: Samara Deist, DPM;  Location: [facility];  Service: Podiatry;   Laterality: Left;   AMPUTATION TOE Left 10/20/2018   Procedure: TOE MPJ T2 LEFT;  Surgeon: Samara Deist, DPM;  Location: [facility];  Service: Podiatry;  Laterality: Left;   AMPUTATION TOE Left 01/12/2019   Procedure: AMPUTATION LEFT 5TH TOE AND JOINT;  Surgeon: Samara Deist, DPM;  Location: [facility];  Service: Podiatry;  Laterality: Left;   BACK SURGERY  2008   BONE BIOPSY Left 11/21/2020   Procedure: BONE BIOPSY;  Surgeon: Samara Deist, DPM;  Location: [facility];  Service: Podiatry;  Laterality: Left;   CHOLECYSTECTOMY     COLONOSCOPY WITH PROPOFOL N/A 08/15/2017   Procedure: COLONOSCOPY WITH PROPOFOL;  Surgeon: Lollie Sails, MD;  Location[facility] ENDOSCOPY;  Service: Endoscopy;  Laterality: N/A;   ENDARTERECTOMY Right 05/12/2017   Procedure: ENDARTERECTOMY CAROTID;  Surgeon: [name], MD;  Location: [facility] ORS;  Service: Vascular;  Laterality: Right;   ENDARTERECTOMY FEMORAL Left 12/01/2018   Procedure: ENDARTERECTOMY FEMORAL;  Surgeon: [name], MD;  Location: [facility] ORS;  Service: Vascular;  Laterality: Left;   ERCP     FOOT SURGERY Right    x 3   IRRIGATION AND DEBRIDEMENT FOOT Left 11/21/2020   Procedure: IRRIGATION AND DEBRIDEMENT FOOT--with placement of wound vac;  Surgeon: Samara Deist, DPM;  Location: [facility] ORS;  Service: Podiatry;  Laterality: Left;   LOWER EXTREMITY ANGIOGRAM Left 12/01/2018   Procedure: LOWER EXTREMITY ANGIOGRAM;  Surgeon: [name], MD;  Location: [facility]  ORS;  Service: Vascular;  Laterality: Left;   LOWER EXTREMITY ANGIOGRAPHY Left 10/30/2018   Procedure: LOWER EXTREMITY ANGIOGRAPHY;  Surgeon: [name], MD;  Location: [place] CV LAB;  Service: Cardiovascular;  Laterality: Left;   LOWER EXTREMITY ANGIOGRAPHY Left 11/30/2018   Procedure: LOWER EXTREMITY ANGIOGRAPHY;  Surgeon: [name], MD;  Location: [place] [facility] LAB;  Service: Cardiovascular;  Laterality: Left;   LOWER EXTREMITY ANGIOGRAPHY Left 11/17/2020   Procedure: Lower Extremity  Angiography;  Surgeon: [name], MD;  Location: [place] CV LAB;  Service: Cardiovascular;  Laterality: Left;   LOWER EXTREMITY ANGIOGRAPHY Left 04/22/2021   Procedure: Lower Extremity Angiography;  Surgeon: [name], MD;  Location: [place] CV LAB;  Service: Cardiovascular;  Laterality: Left;   TEE WITHOUT CARDIOVERSION N/A 11/14/2020   Procedure: TRANSESOPHAGEAL ECHOCARDIOGRAM (TEE);  Surgeon: Corey Skains, MD;  Location: [facility] ORS;  Service: Cardiovascular;  Laterality: N/A;     Family History: Family History  Problem Relation Age of Onset   Diabetes Sister     Social History:  reports that he quit smoking about 24 years ago. His smoking use included cigarettes. He has never used smokeless tobacco. He reports that he does not drink alcohol and does not use drugs.  Physical Exam: Ht 5\' 10"  (1.778 m)   Wt 151 lb (68.5 kg)   BMI 21.67 kg/m    Constitutional: Frail-appearing, in wheelchair Cardiovascular: No clubbing, cyanosis, or edema. Respiratory: Normal respiratory effort, no increased work of breathing. GI: Abdomen is soft, nontender, nondistended, no abdominal masses Left lower extremity amputation GU: uncircumcised phallus with phimosis and purulent drainage, unable to reduce foreskin to visualize glans  Laboratory Data: Reviewed, see HPI  Pertinent Imaging: I have personally viewed and interpreted the CT from February 2022 showing no urologic abnormalities.  Assessment & Plan:   Comorbid 70 year old male with around 6 weeks of dysuria and urgency/frequency, prior urine culture with > 100 K mixed flora, no improvement after 2 courses of Cipro from PCP.  On exam he appears to have significant balanitis and likely fungal infection as well.  Urinalysis today appears grossly infected, will follow-up culture.  I recommended fluconazole x7 days as well as Mycolog cream.  Return precautions discussed, I recommended close follow-up in 3 to 4 weeks for repeat  physical exam and symptom check.  May need to consider cystoscopy in the future if no improvement with above strategies.   Nickolas Madrid, MD 08/04/2021  [facility] [address], [place], [place] [address] [phone]

## 2021-08-04 NOTE — Patient Instructions (Signed)
Balanitis ?Balanitis is swelling and irritation of the head of the penis (glans penis). Balanitis occurs most often among males who have not had their foreskin removed (uncircumcised). In uncircumcised males, the condition may also cause inflammation of the skin around the foreskin. ?Balanitis sometimes causes scarring of the penis or foreskin, which can require surgery. This condition may develop because of an infection or another medical condition. Untreated balanitis can increase the risk of penile cancer. ?What are the causes? ?Common causes of this condition include: ?Irritation and lack of airflow due to fluid (smegma) that can build up on the glans penis. ?Poor personal hygiene, especially in uncircumcised males. Not cleaning the glans penis and foreskin well can result in a buildup of bacteria, viruses, and yeast, which can lead to infection and inflammation. ?Other causes include: ?Chemical irritation from products such as soaps or shower gels, especially those that have fragrance. Chemical irritation can also be caused by condoms, personal lubricants, petroleum jelly, spermicides, fabric softeners, or laundry detergents. ?Skin conditions, such as eczema, dermatitis, and psoriasis. ?Allergies to medicines, such as tetracycline and sulfa drugs. ?What increases the risk? ?The following factors may make you more likely to develop this condition: ?Being an uncircumcised male. ?Having diabetes. ?Having other medical conditions, including liver cirrhosis, congestive heart failure, or kidney disease. ?Having infections, such as candidiasis, HPV (human papillomavirus), herpes simplex, gonorrhea, or syphilis. ?Having a tight foreskin that is difficult to pull back (retract) past the glans penis. ?Being severely obese. ?History of reactive arthritis. ?What are the signs or symptoms? ?Symptoms of this condition include: ?Discharge from under the foreskin, and pain or difficulty retracting the foreskin. ?A bad smell or  itchiness on the penis. ?Tenderness, redness, and swelling of the glans penis. ?A rash or sores on the glans penis or foreskin. ?Inability to get an erection due to pain. ?Trouble urinating. ?Scarring of the penis or foreskin, in some cases. ?How is this diagnosed? ?This condition may be diagnosed based on a physical exam and tests of a swab of discharge to check for bacterial or fungal infection. ?You may also have blood tests to check for: ?Viruses that can cause balanitis. ?A high blood sugar (glucose) level. This could be a sign of diabetes, which can increase the risk of balanitis. ?How is this treated? ?Treatment for this condition depends on the cause. Treatment may include: ?Improving personal hygiene. Your health care provider may recommend sitting in a bath of warm water that is deep enough to cover your hips and buttocks (sitz bath). ?Medicines such as: ?Creams or ointments to reduce swelling (steroids) or to treat an infection. ?Antibiotic medicine. ?Antifungal medicine. ?Having surgery to remove or cut the foreskin (circumcision). This may be done if you have scarring on the foreskin that makes it difficult to retract. ?Controlling other medical problems that may be causing your condition or making it worse. ?Follow these instructions at home: ?Medicines ?Take over-the-counter and prescription medicines only as told by your health care provider. ?If you were prescribed an antibiotic medicine, use it as told by your health care provider. Do not stop using the antibiotic even if you start to feel better. ?General instructions ?Do not have sex until the condition clears up, or until your health care provider approves. ?Keep your penis clean and dry. Take sitz baths as recommended by your health care provider. ?Avoid products that irritate your skin or make symptoms worse, such as soaps and shower gels that have fragrance. ?Keep all follow-up visits. This is important. ?  Contact a health care provider  if: ?Your symptoms get worse or do not improve with home care. ?You develop chills or a fever. ?You have trouble urinating. ?You cannot retract your foreskin. ?Get help right away if: ?You develop severe pain. ?You are unable to urinate. ?Summary ?Balanitis is swelling and irritation of the head of the penis (glans penis). This condition is most common among uncircumcised males. ?Balanitis causes pain, redness, and swelling of the glans penis. ?Good personal hygiene is important. ?Treatment may include improving personal hygiene and applying creams or ointments. ?Contact a health care provider if your symptoms get worse or do not improve with home care. ?This information is not intended to replace advice given to you by your health care provider. Make sure you discuss any questions you have with your health care provider. ?Document Revised: 02/25/2021 Document Reviewed: 02/25/2021 ?Elsevier Patient Education ? 2022 [employer] ? ?

## 2021-08-06 ENCOUNTER — Telehealth: Payer: Self-pay

## 2021-08-06 ENCOUNTER — Other Ambulatory Visit: Payer: Self-pay | Admitting: Urology

## 2021-08-06 ENCOUNTER — Telehealth: Payer: Self-pay | Admitting: Student-PharmD

## 2021-08-06 DIAGNOSIS — A498 Other bacterial infections of unspecified site: Secondary | ICD-10-CM

## 2021-08-06 LAB — URINE CULTURE: Culture: >=100000 — AB

## 2021-08-06 MED ORDER — FOSFOMYCIN TROMETHAMINE 3 G PO PACK: 3.0000 g | PACK | Freq: Once | ORAL | 0 refills | Status: DC

## 2021-08-06 MED ORDER — FOSFOMYCIN TROMETHAMINE 3 G PO PACK: 3.0000 g | PACK | Freq: Once | ORAL | 0 refills | Status: AC

## 2021-08-06 NOTE — Addendum Note (Signed)
Addended by: Alvera Novel on: 08/06/2021 04:04 PM   Modules accepted: Orders

## 2021-08-06 NOTE — Telephone Encounter (Signed)
Unable to get PA for fosfomycin in a timely matter. I spoke with PT daughter Ms.Rosana Hoes advised here [facility] does not honor goodrx coupon. As per daughter she would like medication sent to [facility] in [place]. Text of coupon was sent to daughter. Daughter verbalized understanding and medication was sent to the pharmacy.

## 2021-08-06 NOTE — Progress Notes (Addendum)
Chronic Care Management Pharmacy Assistant   Name: [name]  MRN: [id number] DOB: 1950/11/03  Reason for Encounter: Disease State For HTN   Conditions to be addressed/monitored: HTN, PAD, SVT, GERD, Type II DM, Lipids, Arthritis  Recent office visits:  None since last pharm d visit on 06/26/21  Recent consult visits:  08/04/21 Urology Billey Co, MD. For follow-up. STARTED Fluconazole 200 mg daily x7 days. COMPLETED Cipro, Doxycyline, and Nitro.  07/15/21 Vascular Surgery Kris Hartmann, NP. For follow-up. No medication changes.   Hospital visits:  None since last pharm d visit on 06/26/21  Medications: Outpatient Encounter Medications as of 08/06/2021  Medication Sig   acetaminophen (TYLENOL) 325 MG tablet Take 650 mg by mouth every 6 (six) hours as needed for moderate pain.   aspirin EC 81 MG tablet Take 81 mg by mouth daily.   Calcium Carbonate-Vitamin D (CALCIUM PLUS VITAMIN D PO) Take 1 tablet by mouth daily.   clopidogrel (PLAVIX) 75 MG tablet Take 1 tablet (75 mg total) by mouth daily.   cyclobenzaprine (FLEXERIL) 10 MG tablet Take 1 tablet (10 mg total) by mouth 3 (three) times daily as needed for muscle spasms.   diphenhydrAMINE (BENADRYL) 25 mg capsule Take 25 mg by mouth every 6 (six) hours as needed for allergies.   fluconazole (DIFLUCAN) 100 MG tablet Take 2 tablets (200 mg total) by mouth daily. X 7 days   gabapentin (NEURONTIN) 300 MG capsule TAKE 1 CAPSULE THREE TIMES DAILY   glucose blood (TRUE METRIX BLOOD GLUCOSE TEST) test strip TEST BLOOD SUGAR TWICE DAILY AS DIRECTED   glyBURIDE (DIABETA) 5 MG tablet TAKE 1 TABLET TWICE DAILY WITH MEALS   lovastatin (MEVACOR) 10 MG tablet Take 1 tab  Po QHS   meloxicam (MOBIC) 15 MG tablet Take 15 mg by mouth daily.   metoprolol succinate (TOPROL-XL) 50 MG 24 hr tablet Take 1 tablet (50 mg total) by mouth daily. Take with or immediately following a meal.   mometasone-formoterol (DULERA) 100-5 MCG/ACT AERO Inhale 2  puffs into the lungs 2 (two) times daily as needed for wheezing.   nystatin-triamcinolone (MYCOLOG II) cream Apply 1 application topically 2 (two) times daily for 14 days.   pantoprazole (PROTONIX) 40 MG tablet    traMADol (ULTRAM) 50 MG tablet Take 1 tablet (50 mg total) by mouth every 12 (twelve) hours as needed.   TRUEplus Lancets 30G MISC by Does not apply route. Use as directed twice a daily Dx E11.65   No facility-administered encounter medications on file as of 08/06/2021.   Reviewed chart prior to disease state call. Spoke with patient regarding BP  Recent Office Vitals: BP Readings from Last 3 Encounters:  07/15/21 126/73  06/24/21 (!) 146/80  06/22/21 126/83   Pulse Readings from Last 3 Encounters:  07/15/21 88  06/24/21 83  06/22/21 83    Wt Readings from Last 3 Encounters:  08/04/21 151 lb (68.5 kg)  07/15/21 151 lb (68.5 kg)  06/24/21 141 lb (64 kg)     Kidney Function Lab Results  Component Value Date/Time   CREATININE 0.99 04/28/2021 05:13 AM   CREATININE 0.84 04/26/2021 05:59 AM   CREATININE 1.30 04/17/2014 10:26 AM   CREATININE 1.60 (H) 12/17/2013 04:12 PM   GFRNONAA >60 04/28/2021 05:13 AM   GFRNONAA 59 (L) 04/17/2014 10:26 AM   GFRAA >60 06/20/2020 10:01 AM   GFRAA >60 04/17/2014 10:26 AM    BMP Latest Ref Rng & Units 04/28/2021 04/26/2021 04/25/2021  Glucose  70 - 99 mg/dL - 194(H) 196(H)  BUN 8 - 23 mg/dL - 36(H) 26(H)  Creatinine 0.61 - 1.24 mg/dL 0.99 0.84 0.89  Sodium 135 - 145 mmol/L - 135 137  Potassium 3.5 - 5.1 mmol/L - 4.8 4.5  Chloride 98 - 111 mmol/L - 103 108  CO2 22 - 32 mmol/L - 25 25  Calcium 8.9 - 10.3 mg/dL - 8.4(L) 8.0(L)    Current antihypertensive regimen:  Toprol XL 50mg  daily-STOPPED patients choice because it was causing his blood pressure to be too low.  How often are you checking your Blood Pressure? Patient stated infrequently  Current home BP readings: Patient stated his most recent blood pressure was around  118/60  What recent interventions/DTPs have been made by any provider to improve Blood Pressure control since last CPP Visit: None.   Any recent hospitalizations or ED visits since last visit with CPP? Patient stated no.   What diet changes have been made to improve Blood Pressure Control?  Patient stated he eats 2-3 times a day. He stated he drinks a lot of water.  What exercise is being done to improve your Blood Pressure Control?  Patient stated he is waiting to get his prosthetic leg soon and he will be having physical therapy si he he can practice walking again.   Adherence Review: Is the patient currently on ACE/ARB medication? N/A.  Does the patient have >5 day gap between last estimated fill dates? N/A.  Care Gaps:Patient is due for his eye exam and updated labs.   Star Rating Drugs:Lovastatin 10 mg 05/24/21 4 DS.  Follow-Up:Pharmacist Review  Charlann Lange, RMA Clinical Pharmacist Assistant [phone]  5 minutes spent in review, coordination, and documentation.  Reviewed by: Alena Bills PharmD Clinical Pharmacist [phone]

## 2021-08-06 NOTE — Telephone Encounter (Signed)
Called pt no answer. Left detailed message on machine per DPR. RX sent in. Advised pt to call back for questions or concerns.

## 2021-08-06 NOTE — Telephone Encounter (Signed)
-----   Message from Billey Co, MD sent at 08/06/2021  8:56 AM EST ----- He grew out a bacteria resistant to both abx he had been on by PCP. Please stop fluconazole and mycolog cream, and start fosfomycin 3g x1. RTC w/PA 3 weeks symptom check, thanks  Nickolas Madrid, MD 08/06/2021

## 2021-08-07 ENCOUNTER — Telehealth: Payer: Self-pay | Admitting: Urology

## 2021-08-07 ENCOUNTER — Telehealth: Payer: Self-pay

## 2021-08-07 NOTE — Telephone Encounter (Signed)
Incoming approval of Fosfomycin from [employer].

## 2021-08-07 NOTE — Telephone Encounter (Signed)
Incoming call from pt's daughter who states she wants to clarify if pt is supposed to discontinue Amoxicillin and Mycolog cream. Advised daughter the the pt is to d/c both of those medications. Reiterated that the one time dose of Fosfomycin will treat current infection based on urine cx. Daughter gave verbal understanding.

## 2021-08-07 NOTE — Telephone Encounter (Signed)
Daughter called and said someone left a message for [name] and he doesn't understand what was said, so she would like a call back to discuss his medication.

## 2021-08-07 NOTE — Telephone Encounter (Signed)
Called pt's daughter, no answer. Left detailed message for daughter informing her that the message on her father's voicemail is the same thing she spoke to [facility] about yesterday. No new information. Advised her to call back for questions or concerns.

## 2021-08-14 ENCOUNTER — Other Ambulatory Visit: Payer: Self-pay

## 2021-08-14 NOTE — Patient Outreach (Signed)
[facility]) Care Management  08/14/2021  [name] 02-04-1951 [id number]   Telephone call to patient for follow up. No answer.  HIPAA compliant voice message left.    Plan: RN CM will attempt again within 4 business days.  [name], RN, MSN [employer] [facility] Care Management Coordinator Direct Line [phone] Cell [phone] Toll Free: [phone]  Fax: [phone]

## 2021-08-17 ENCOUNTER — Other Ambulatory Visit: Payer: Self-pay

## 2021-08-17 NOTE — Patient Outreach (Signed)
[facility]) Care Management  08/17/2021  LANDERS PRAJAPATI 1951/04/04 [id number]   Telephone call to patient for follow up.   No answer.  HIPAA compliant voice message left.    Plan: If no return call, RN CM will attempt patient again with 4 business days.  [name], RN, MSN [place] Management Care Management Coordinator Direct Line [phone] Cell [phone] Toll Free: [phone]  Fax: [phone]

## 2021-08-17 NOTE — Patient Instructions (Signed)
Patient Goals/Self-Care Activities: Patient will self administer medications as prescribed Patient will attend all scheduled provider appointments Patient will continue to perform ADL's independently Patient will check blood sugars as prescribed Patient will follow low carbohydrate diet Patient will call provider office for new concerns or questions

## 2021-08-17 NOTE — Patient Outreach (Signed)
[facility]) Care Management  [place]  08/17/2021   [name] April 24, 1951 [id number]  Subjective: Telephone call to patient for follow up. Patient reports he is doing good.  His stump site is now healed and possible for prothesis fitting next month.  Patient admits to sugars being higher.  Discussed with patient diabetes management and importance.  He verbalized understanding.   Objective:   Encounter Medications:  Outpatient Encounter Medications as of 08/17/2021  Medication Sig   acetaminophen (TYLENOL) 325 MG tablet Take 650 mg by mouth every 6 (six) hours as needed for moderate pain.   aspirin EC 81 MG tablet Take 81 mg by mouth daily.   Calcium Carbonate-Vitamin D (CALCIUM PLUS VITAMIN D PO) Take 1 tablet by mouth daily.   clopidogrel (PLAVIX) 75 MG tablet Take 1 tablet (75 mg total) by mouth daily.   cyclobenzaprine (FLEXERIL) 10 MG tablet Take 1 tablet (10 mg total) by mouth 3 (three) times daily as needed for muscle spasms.   diphenhydrAMINE (BENADRYL) 25 mg capsule Take 25 mg by mouth every 6 (six) hours as needed for allergies.   gabapentin (NEURONTIN) 300 MG capsule TAKE 1 CAPSULE THREE TIMES DAILY   glucose blood (TRUE METRIX BLOOD GLUCOSE TEST) test strip TEST BLOOD SUGAR TWICE DAILY AS DIRECTED   glyBURIDE (DIABETA) 5 MG tablet TAKE 1 TABLET TWICE DAILY WITH MEALS   lovastatin (MEVACOR) 10 MG tablet Take 1 tab  Po QHS   meloxicam (MOBIC) 15 MG tablet Take 15 mg by mouth daily.   metoprolol succinate (TOPROL-XL) 50 MG 24 hr tablet Take 1 tablet (50 mg total) by mouth daily. Take with or immediately following a meal.   mometasone-formoterol (DULERA) 100-5 MCG/ACT AERO Inhale 2 puffs into the lungs 2 (two) times daily as needed for wheezing.   pantoprazole (PROTONIX) 40 MG tablet    traMADol (ULTRAM) 50 MG tablet Take 1 tablet (50 mg total) by mouth every 12 (twelve) hours as needed.   TRUEplus Lancets 30G MISC by Does not apply route. Use as  directed twice a daily Dx E11.65   No facility-administered encounter medications on file as of 08/17/2021.    Functional Status:  In your present state of health, do you have any difficulty performing the following activities: 08/17/2021 06/22/2021  Hearing? N N  Vision? N N  Difficulty concentrating or making decisions? N N  Walking or climbing stairs? Y Y  Comment left BKA -  Dressing or bathing? N N  Doing errands, shopping? Y Y  Comment step daughter assists -  Conservation officer, nature and eating ? N N  Using the Toilet? N N  In the past six months, have you accidently leaked urine? N N  Do you have problems with loss of bowel control? N N  Managing your Medications? Y N  Comment step daughter assist -  Managing your Finances? Y N  Comment step daughter assists -  Housekeeping or managing your Housekeeping? Y Y  Comment step daughter assists -  Some recent data might be hidden    Fall/Depression Screening: Fall Risk  08/17/2021 06/22/2021 05/27/2021  Falls in the past year? 0 0 0  Number falls in past yr: - - -  Injury with Fall? - - -  Risk for fall due to : - No Fall Risks -  Follow up - Falls evaluation completed -   [facility] 2/9 Scores 08/17/2021 06/22/2021 05/27/2021 04/02/2021 03/26/2021 01/28/2021 12/30/2020  PHQ - 2 Score 0 0  0 0 0 0 0    Assessment:   Care Plan Care Plan : General Plan of Care (Adult)  Updates made by [name], RN since 08/17/2021 12:00 AM  Completed 08/17/2021   Problem: Therapeutic Alliance (General Plan of Care) Resolved 08/17/2021     Goal: Self-Management Plan Developed as evidenced by patient monitoring left BKA site for redness, swelling and drainage. Completed 08/17/2021  Start Date: 05/27/2021  Expected End Date: 08/26/2021  Recent Progress: On track  Priority: High  Note:   Evidence-based guidance:  Review biopsychosocial determinants of health screens.  Determine level of modifiable health risk.  Assess level of patient activation, level  of readiness, importance and confidence to make changes.  Evoke change talk using open-ended questions, pros and cons, as well as looking forward.  Identify areas where behavior change may lead to improved health.  Partner with patient to develop a robust self-management plan that includes lifestyle factors, such as weight loss, exercise and healthy nutrition, as well as goals specific to disease risks.  Support patient and family/caregiver active participation in decision-making and self-management plan.  Implement additional goals and interventions based on identified risk factors to reduce health risk.  Facilitate advance care planning.  Review need for preventive screening based on age, sex, family history and health history.   Notes: 08/17/21 Area completely healed now    Task: Mutually Develop and Royce Macadamia Achievement of Patient Goals Completed 08/17/2021  Due Date: 07/27/2021  Priority: Routine  Responsible User: [name], RN  Note:   Care Management Activities:    - collaboration with team encouraged - questions answered - reassurance provided    Notes: 05/27/21 Patient with recent left BKA after foot wound infection.  Patient reports wound is dressed with gauze. He denies any redness, swelling or drainage.  Home health involved. Wound Management Discussed Monitor for swelling, redness, pain, pus, and fever Keep wound clean and dry.   Notify physician immediately for any changes  06/08/21 Patient denies redness or swelling to left stump site. Home health continues with dressing changes. Reviewed with patient signs of infection.   06/23/21 Patient denies signs of infection.  Home health continues for dressing changes and therapy.  Reiterated signs of infection.     07/09/21 Patient reports that left leg amputation site is just about healed. He reports only on spot with a scab and it is about to fall off.  Appointment on 07/15/21 with surgeon and patient is hopeful for talk about  prothesis.  Reviewed signs of infection.  No concerns.       Problem: Health Promotion as evidenced by patient monitoring left leg BKA for infection Resolved 08/17/2021  Priority: High  Onset Date: 05/27/2021     Care Plan : Diabetes Type 2 (Adult)  Updates made by [name], RN since 08/17/2021 12:00 AM  Completed 08/17/2021   Problem: Glycemic Management (Diabetes, Type 2) Resolved 08/17/2021     Long-Range Goal: Glycemic Management Optimized as evidenced by A1c less than 8.5 Completed 08/17/2021  Start Date: 11/25/2020  Expected End Date: 03/26/2022  This Visit's Progress: On track  Recent Progress: On track  Priority: Medium  Note:   Evidence-based guidance:  Promote self-monitoring of blood glucose levels.  Assess and address barriers to management plan, such as food insecurity, age,  developmental ability, depression, anxiety, fear of hypoglycemia or weight gain,  as well as medication cost, side effects and complicated regimen.  Notes: 01/15/21 Patient still not checking sugars. Discussed rationale for  checking sugars.   16/10/96 Resolving duplicate goal    Task: Alleviate Barriers to Glycemic Management Completed 08/17/2021  Due Date: 03/26/2022  Priority: Routine  Responsible User: [name], RN  Note:   Care Management Activities:    - barriers to adherence to treatment plan identified - blood glucose monitoring encouraged - use of blood glucose monitoring log promoted    Notes: Patient has not checked blood sugar since being at home yet.  Encouraged patient to check sugars regularly. 01/15/21 Patient still not checking sugars.  Denies any barriers to checking sugars. CM continues to encourage to check sugars.  12/22/20 Patient still not checking sugars regularly.  Patient reports no barriers to checking sugars such as machine not working or supplies or inability to check.  02/19/21 Patient continues not to check sugars.  Encouraged to check. Discussed  carbohydrate limiting.  A1c 7.1. 05/27/21 Patient last sugar check 202. Patient checks sugar at least weekly. Discussed importance of monitoring sugar regularly.   Diabetes Management Discussed: Medication adherence Reviewed importance of limiting carbs such as rice, potatoes, breads, and pastas. Also discussed limiting sweets and sugary drinks.  Discussed importance of portion control.  Also discussed importance of annual exams, foot exams, and eye exams.   06/08/21 Patient sugar this am was in the 200's per patient. He could not remember the exact number but states he had already eaten when he checked it.  Stressed importance of diabetes management in wound healing. Patient verbalized understanding.  06/23/21 Patient checking sugars at least daily reports blood sugars 100-150.  A1c 6.9.  Encouraged patient to keep up the great work with his diabetes.  07/09/21 Patient checking sugars regularly.  He reports one in the 200's.  Discussed the importance of good diabetes management.  He verbalized understanding.      Care Plan : RN Care Manager Plan of Care  Updates made by [name], RN since 08/17/2021 12:00 AM     Problem: Chronic disease management and care coordination for Diabetes   Priority: High     Long-Range Goal: Development of Plan of Care for the management of diabetes   Start Date: 08/17/2021  Expected End Date: 08/26/2022  Priority: High  Note:   Current Barriers:  Chronic Disease Management support and education needs related to DMII  RNCM Clinical Goal(s):  Patient will verbalize basic understanding of  DMII disease process and self health management plan for diabetes take all medications exactly as prescribed and will call provider for medication related questions continue to work with RN Care Manager to address care management and care coordination needs related to  DMII through collaboration with RN Care manager, provider, and care team.   Interventions: Education  and support for diabetes self management Inter-disciplinary care team collaboration (see longitudinal plan of care) Evaluation of current treatment plan related to  self management and patient's adherence to plan as established by provider   Diabetes Interventions: Assessed patient's understanding of A1c goal: <7% Provided education to patient about basic DM disease process; Discussed plans with patient for ongoing care management follow up and provided patient with direct contact information for care management team; Lab Results  Component Value Date   HGBA1C 6.9 (A) 06/22/2021  Patient reports blood sugars has been in the 300's recently.  Patient admits to diet indiscretions.  Counseled patient on rationale for blood sugar management and complications of uncontrolled diabetes.   Patient Goals/Self-Care Activities: Patient will self administer medications as prescribed Patient will attend all scheduled  provider appointments Patient will continue to perform ADL's independently Patient will check blood sugars as prescribed Patient will follow low carbohydrate diet Patient will call provider office for new concerns or questions  Follow Up Plan:  Telephone follow up appointment with care management team member scheduled for:  January The patient has been provided with contact information for the care management team and has been advised to call with any health related questions or concerns.        Goals Addressed               This Visit's Progress     COMPLETED: Careful Skin Care-incision to left BKA site (pt-stated)        Barriers: Knowledge Timeframe:  Short-Term Goal Priority:  High Start Date:   11/25/20                          Expected End Date:  08/26/21      Follow Up Date  08/26/21 - clean and dry skin well  -keep dressing intact.  -monitor for signs such as redness, swelling or pain to site   Why is this important?    Taking really good care of your skin will  help to keep your skin unbroken.    Notes: 11/25/20 Patient reports wound vac intact with no redness or swelling.  12/03/20 Patient to wear wound vac for 2 more weeks. 12/09/20 Wound making progress per patient 12/22/20 Wound making progress 01/15/21 Patient reports some wound closure.  Patient recently saw podiatrist.   02/19/21 Wound vac discontinued.  Wound clean and dry.  Home health changing dressing weekly per patient. 03/26/21 Wound to heel completely healed.  Advised to keep pressure off wound.   05/27/21 Patient with new left BKA incision site. Patient reports area with no redness or drainage. Home health involved.  Patient reports being independent with basic ADL's. Daughter helps as needed.  Wound Management Discussed Monitor for swelling, redness, pain, pus, and fever Keep wound clean and dry.   Notify physician immediately for any changes  06/08/21 Patient reports his left BKA site is doing good.  No redness swelling or drainage. [facility] continues with dressing changes and wound care. Due from a visit this morning per patient. Reviewed wound management. He verbalized understanding.   06/23/21 Patient denies signs of infection.  Home health continues with dressing changes. Reiterated signs of infection.   07/09/21 Patient proud of amputation site progress with only one scabbed over site.  Follow up with surgeon coming up.  Reviewed infection signs.   08/17/21 Patient reports area completely healed      COMPLETED: Monitor and Manage My Blood Sugar-Diabetes Type 2 (pt-stated)   On track     Barriers: Health Behaviors Knowledge Timeframe:  Long-Range Goal Priority:  High Start Date:    11/25/20                         Expected End Date:   03/26/22 Follow Up Date  08/26/21  - check blood sugar at prescribed times    Why is this important?   Checking your blood sugar at home helps to keep it from getting very high or very low.  Writing the results in a diary or log helps the doctor know  how to care for you.  Your blood sugar log should have the time, date and the results.  Also, write down the amount of  insulin or other medicine that you take.  Other information, like what you ate, exercise done and how you were feeling, will also be helpful.     Notes: Patient currently not checking encouraged to check. 12/03/20 Patient not checking sugars currently. Encouraged patient to check sugars. 12/09/20 Patient checked blood sugar it was 223.   12/22/20  PLEASE CHECK YOUR BLOOD SUGARS. 01/15/21 Please check your sugars.   02/17/21 Not checking sugars at home.  "Hard to bleed"  02/19/21 Not checking sugars. He states he does not bleed.  Discussed methods to promote bleeding. 03/26/21 Patient reports checking sugars now.  Encouraged to continue.  Blood sugar this am was 190.   Diabetes Management Discussed: Medication adherence Reviewed importance of limiting carbs such as rice, potatoes, breads, and pastas. Also discussed limiting sweets and sugary drinks.  Discussed importance of portion control.  Also discussed importance of annual exams, foot exams, and eye exams.   05/27/21 Patient reports last sugar check was 202.  Reviewed diabetes management and importance. Patient checks sugars at least weekly.  Diabetes Management Discussed: Medication adherence Reviewed importance of limiting carbs such as rice, potatoes, breads, and pastas. Also discussed limiting sweets and sugary drinks.  Discussed importance of portion control.  Also discussed importance of annual exams, foot exams, and eye exams.   06/08/21 Patient reports sugar this am was in the 200's. He could not remember exact number but states he had already eaten. Discussed checking sugar prior to eating in the morning. He verbalized understanding.  Reviewed diabetes management and importance for wound healing.  He verbalized understanding.   06/23/21 Patient checking sugars at least daily.  Sugar ranging recently 100-150. Discussed diabetes  management.  Keep up the great work!! 07/09/21 Patient continues checking sugars daily.  One report of a blood sugar in the 200's  Discussed the importance of good diabetes management and diet control.  He verbalized understanding.  76/81/15 REsolving duplicate goal        Plan:  Follow-up: Patient agrees to Care Plan and Follow-up. Follow-up in 2 month(s)  [name], RN, MSN [facility] Care Management Coordinator Direct Line [phone] Cell [phone] Toll Free: [phone]  Fax: [phone]

## 2021-08-26 DIAGNOSIS — K219 Gastro-esophageal reflux disease without esophagitis: Secondary | ICD-10-CM | POA: Diagnosis not present

## 2021-08-26 DIAGNOSIS — E1165 Type 2 diabetes mellitus with hyperglycemia: Secondary | ICD-10-CM | POA: Diagnosis not present

## 2021-08-26 DIAGNOSIS — I10 Essential (primary) hypertension: Secondary | ICD-10-CM | POA: Diagnosis not present

## 2021-09-01 ENCOUNTER — Ambulatory Visit: Payer: Medicare HMO | Admitting: Urology

## 2021-09-01 ENCOUNTER — Encounter: Payer: Self-pay | Admitting: Urology

## 2021-09-01 ENCOUNTER — Ambulatory Visit (INDEPENDENT_AMBULATORY_CARE_PROVIDER_SITE_OTHER): Payer: Medicare HMO | Admitting: Urology

## 2021-09-01 ENCOUNTER — Other Ambulatory Visit: Payer: Self-pay

## 2021-09-01 VITALS — BP 128/82 | HR 79 | Ht 70.0 in | Wt 151.0 lb

## 2021-09-01 DIAGNOSIS — N390 Urinary tract infection, site not specified: Secondary | ICD-10-CM | POA: Diagnosis not present

## 2021-09-01 DIAGNOSIS — E113313 Type 2 diabetes mellitus with moderate nonproliferative diabetic retinopathy with macular edema, bilateral: Secondary | ICD-10-CM | POA: Diagnosis not present

## 2021-09-01 NOTE — Progress Notes (Signed)
   09/01/2021 10:52 AM   [name] December 25, 1950 [id number]  Reason for visit: Follow up UTI  HPI: Very comorbid 70 year old male who I saw back in follow-up with his daughter.  He has a number of comorbidities including diabetes, vascular disease, COPD.  He presented to his PCP with urinary symptoms of dysuria and urgency/frequency and urine culture grew greater than 100 K mixed flora and he was treated with Cipro.  He had no improvement on the Cipro x2.  His culture with [place] ultimately grew out Proteus resistant to Cipro, and with his penicillin allergy antibiotic choices were limited.  He was treated with fosfomycin, and has had complete resolution of his UTI symptoms.  I recommended starting cranberry tablet prophylaxis for his infections, and we discussed diabetes control.  On exam today he has phimosis, but resolution of prior purulence and no evidence of any other abnormalities.  Follow-up with urology as needed  [facility] [place], [place] [address], [place], [place] [address] [phone]

## 2021-09-02 DIAGNOSIS — I428 Other cardiomyopathies: Secondary | ICD-10-CM | POA: Diagnosis not present

## 2021-09-02 DIAGNOSIS — I471 Supraventricular tachycardia: Secondary | ICD-10-CM | POA: Diagnosis not present

## 2021-09-02 DIAGNOSIS — I739 Peripheral vascular disease, unspecified: Secondary | ICD-10-CM | POA: Diagnosis not present

## 2021-09-02 DIAGNOSIS — E785 Hyperlipidemia, unspecified: Secondary | ICD-10-CM | POA: Diagnosis not present

## 2021-09-02 DIAGNOSIS — I10 Essential (primary) hypertension: Secondary | ICD-10-CM | POA: Diagnosis not present

## 2021-09-02 DIAGNOSIS — Z23 Encounter for immunization: Secondary | ICD-10-CM | POA: Diagnosis not present

## 2021-09-03 DIAGNOSIS — J439 Emphysema, unspecified: Secondary | ICD-10-CM | POA: Diagnosis not present

## 2021-09-03 NOTE — Telephone Encounter (Signed)
Closing note 

## 2021-09-10 ENCOUNTER — Telehealth (INDEPENDENT_AMBULATORY_CARE_PROVIDER_SITE_OTHER): Payer: Self-pay

## 2021-09-10 NOTE — Telephone Encounter (Signed)
Pt's daughter called and left on the  nurses line wanting to know have we the order for her daa from [facility] if so could we fax it over with office notes attached. Please advise.

## 2021-09-10 NOTE — Telephone Encounter (Signed)
I believe I signed the info [facility] sent [place] a few days ago.  Ask april

## 2021-09-11 ENCOUNTER — Ambulatory Visit: Payer: Self-pay

## 2021-09-15 DIAGNOSIS — Z89512 Acquired absence of left leg below knee: Secondary | ICD-10-CM | POA: Diagnosis not present

## 2021-09-15 DIAGNOSIS — R262 Difficulty in walking, not elsewhere classified: Secondary | ICD-10-CM | POA: Diagnosis not present

## 2021-09-15 DIAGNOSIS — R2689 Other abnormalities of gait and mobility: Secondary | ICD-10-CM | POA: Diagnosis not present

## 2021-09-18 DIAGNOSIS — Z89512 Acquired absence of left leg below knee: Secondary | ICD-10-CM | POA: Diagnosis not present

## 2021-09-24 ENCOUNTER — Other Ambulatory Visit: Payer: Self-pay

## 2021-09-24 ENCOUNTER — Encounter: Payer: Self-pay | Admitting: Physician Assistant

## 2021-09-24 ENCOUNTER — Ambulatory Visit (INDEPENDENT_AMBULATORY_CARE_PROVIDER_SITE_OTHER): Payer: Medicare HMO | Admitting: Physician Assistant

## 2021-09-24 DIAGNOSIS — M19012 Primary osteoarthritis, left shoulder: Secondary | ICD-10-CM

## 2021-09-24 DIAGNOSIS — R262 Difficulty in walking, not elsewhere classified: Secondary | ICD-10-CM | POA: Diagnosis not present

## 2021-09-24 DIAGNOSIS — I10 Essential (primary) hypertension: Secondary | ICD-10-CM | POA: Diagnosis not present

## 2021-09-24 DIAGNOSIS — R0902 Hypoxemia: Secondary | ICD-10-CM | POA: Diagnosis not present

## 2021-09-24 DIAGNOSIS — Z89512 Acquired absence of left leg below knee: Secondary | ICD-10-CM | POA: Diagnosis not present

## 2021-09-24 DIAGNOSIS — M19011 Primary osteoarthritis, right shoulder: Secondary | ICD-10-CM

## 2021-09-24 DIAGNOSIS — J449 Chronic obstructive pulmonary disease, unspecified: Secondary | ICD-10-CM | POA: Diagnosis not present

## 2021-09-24 DIAGNOSIS — E1165 Type 2 diabetes mellitus with hyperglycemia: Secondary | ICD-10-CM

## 2021-09-24 DIAGNOSIS — R2689 Other abnormalities of gait and mobility: Secondary | ICD-10-CM | POA: Diagnosis not present

## 2021-09-24 LAB — POCT GLYCOSYLATED HEMOGLOBIN (HGB A1C): Hemoglobin A1C: 9.5 % — AB (ref 4.0–5.6)

## 2021-09-24 MED ORDER — TRAMADOL HCL 50 MG PO TABS: 50.0000 mg | ORAL_TABLET | Freq: Two times a day (BID) | ORAL | 2 refills | Status: DC | PRN

## 2021-09-24 MED ORDER — EMPAGLIFLOZIN 25 MG PO TABS: 25.0000 mg | ORAL_TABLET | Freq: Every day | ORAL | 1 refills | Status: DC

## 2021-09-24 NOTE — Progress Notes (Signed)
[facility] [place], [place] [address]  Internal MEDICINE  Office Visit Note  Patient Name: [name]  [id number]  [id number]  Date of Service: 09/29/2021  Chief Complaint  Patient presents with   Follow-up   Diabetes    431 non fasting glucose   Gastroesophageal Reflux   Hypertension    HPI Pt is here for routine follow up -He is followed by vascular for LLE amputation, good until Oct next year -Cardiology seen in beginning of December -He has also been seen by urology for recurrent UTIs -PT today working with prosthetic -BG in AM 190-200, sometimes higher. Will continue glyburide BID as before and add jardiance--samples given for 10mg  and then 25mg  and script will be sent for 25mg . Pt will call if not tolerated of if problem getting med. Advised to contact insurance for preferred med options if not covered. Patient also urged to really work on diet to help improve sugars and stop high fluctuations. Discussed that if not improving will have to consider insulin and possible endocrinology referral -Also states he still has not heard about inhaler and discussed that pharmacy tried to contact him several times but there was no answer. He has a scheduled phone call with them next week and will be ready to answer. Message will be sent to pharmacist to be prepared to answer question about inhaler assistance.   Current Medication: Outpatient Encounter Medications as of 09/24/2021  Medication Sig   acetaminophen (TYLENOL) 325 MG tablet Take 650 mg by mouth every 6 (six) hours as needed for moderate pain.   aspirin EC 81 MG tablet Take 81 mg by mouth daily.   Calcium Carbonate-Vitamin D (CALCIUM PLUS VITAMIN D PO) Take 1 tablet by mouth daily.   clopidogrel (PLAVIX) 75 MG tablet Take 1 tablet (75 mg total) by mouth daily.   cyclobenzaprine (FLEXERIL) 10 MG tablet Take 1 tablet (10 mg total) by mouth 3 (three) times daily as needed for muscle spasms.    diphenhydrAMINE (BENADRYL) 25 mg capsule Take 25 mg by mouth every 6 (six) hours as needed for allergies.   empagliflozin (JARDIANCE) 25 MG TABS tablet Take 1 tablet (25 mg total) by mouth daily.   gabapentin (NEURONTIN) 300 MG capsule TAKE 1 CAPSULE THREE TIMES DAILY   glucose blood (TRUE METRIX BLOOD GLUCOSE TEST) test strip TEST BLOOD SUGAR TWICE DAILY AS DIRECTED   glyBURIDE (DIABETA) 5 MG tablet TAKE 1 TABLET TWICE DAILY WITH MEALS   lovastatin (MEVACOR) 10 MG tablet Take 1 tab  Po QHS   meloxicam (MOBIC) 15 MG tablet Take 15 mg by mouth daily.   metoprolol succinate (TOPROL-XL) 25 MG 24 hr tablet Take 1 tablet by mouth daily.   mometasone-formoterol (DULERA) 100-5 MCG/ACT AERO Inhale 2 puffs into the lungs 2 (two) times daily as needed for wheezing.   pantoprazole (PROTONIX) 40 MG tablet    TRUEplus Lancets 30G MISC by Does not apply route. Use as directed twice a daily Dx E11.65   [DISCONTINUED] traMADol (ULTRAM) 50 MG tablet Take 1 tablet (50 mg total) by mouth every 12 (twelve) hours as needed.   traMADol (ULTRAM) 50 MG tablet Take 1 tablet (50 mg total) by mouth every 12 (twelve) hours as needed.   [DISCONTINUED] metoprolol succinate (TOPROL-XL) 50 MG 24 hr tablet Take 1 tablet (50 mg total) by mouth daily. Take with or immediately following a meal. (Patient not taking: Reported on 09/24/2021)   No facility-administered encounter medications on file as  of 09/24/2021.    Surgical History: Past Surgical History:  Procedure Laterality Date   AMPUTATION Left 04/23/2021   Procedure: AMPUTATION BELOW KNEE;  Surgeon: [name], MD;  Location: [facility];  Service: General;  Laterality: Left;   AMPUTATION TOE Left 02/25/2017   Procedure: AMPUTATION TOE-LEFT 2ND MPJ;  Surgeon: Samara Deist, DPM;  Location: [facility];  Service: Podiatry;  Laterality: Left;   AMPUTATION TOE Left 10/20/2018   Procedure: TOE MPJ T2 LEFT;  Surgeon: Samara Deist, DPM;  Location: [facility];  Service: Podiatry;   Laterality: Left;   AMPUTATION TOE Left 01/12/2019   Procedure: AMPUTATION LEFT 5TH TOE AND JOINT;  Surgeon: Samara Deist, DPM;  Location: [facility];  Service: Podiatry;  Laterality: Left;   BACK SURGERY  2008   BONE BIOPSY Left 11/21/2020   Procedure: BONE BIOPSY;  Surgeon: Samara Deist, DPM;  Location: [facility];  Service: Podiatry;  Laterality: Left;   CHOLECYSTECTOMY     COLONOSCOPY WITH PROPOFOL N/A 08/15/2017   Procedure: COLONOSCOPY WITH PROPOFOL;  Surgeon: Lollie Sails, MD;  Location: [facility];  Service: Endoscopy;  Laterality: N/A;   ENDARTERECTOMY Right 05/12/2017   Procedure: ENDARTERECTOMY CAROTID;  Surgeon: [name], MD;  Location: [facility];  Service: Vascular;  Laterality: Right;   ENDARTERECTOMY FEMORAL Left 12/01/2018   Procedure: ENDARTERECTOMY FEMORAL;  Surgeon: [name], MD;  Location: [facility];  Service: Vascular;  Laterality: Left;   ERCP     FOOT SURGERY Right    x 3   IRRIGATION AND DEBRIDEMENT FOOT Left 11/21/2020   Procedure: IRRIGATION AND DEBRIDEMENT FOOT--with placement of wound vac;  Surgeon: Samara Deist, DPM;  Location: [facility];  Service: Podiatry;  Laterality: Left;   LOWER EXTREMITY ANGIOGRAM Left 12/01/2018   Procedure: LOWER EXTREMITY ANGIOGRAM;  Surgeon: [name], MD;  Location: [facility];  Service: Vascular;  Laterality: Left;   LOWER EXTREMITY ANGIOGRAPHY Left 10/30/2018   Procedure: LOWER EXTREMITY ANGIOGRAPHY;  Surgeon: [name], MD;  Location: [place] [facility];  Service: Cardiovascular;  Laterality: Left;   LOWER EXTREMITY ANGIOGRAPHY Left 11/30/2018   Procedure: LOWER EXTREMITY ANGIOGRAPHY;  Surgeon: [name], MD;  Location: [place] [facility];  Service: Cardiovascular;  Laterality: Left;   LOWER EXTREMITY ANGIOGRAPHY Left 11/17/2020   Procedure: Lower Extremity Angiography;  Surgeon: [name], MD;  Location: [place] [facility];  Service: Cardiovascular;  Laterality: Left;   LOWER EXTREMITY ANGIOGRAPHY Left  04/22/2021   Procedure: Lower Extremity Angiography;  Surgeon: [name], MD;  Location: [place] [facility];  Service: Cardiovascular;  Laterality: Left;   TEE WITHOUT CARDIOVERSION N/A 11/14/2020   Procedure: TRANSESOPHAGEAL ECHOCARDIOGRAM (TEE);  Surgeon: Corey Skains, MD;  Location: [facility];  Service: Cardiovascular;  Laterality: N/A;    Medical History: Past Medical History:  Diagnosis Date   Cholecystitis, chronic    Cholelithiasis    COPD (chronic obstructive pulmonary disease) ([place])    Diabetes mellitus without complication ([place])    Diabetic retinopathy ([place])    Dyspnea    Fatty liver 2008   Fracture of clavicle 1992   left    Fracture of ribs, multiple 1992   3 ribs   GERD (gastroesophageal reflux disease)    Hyperlipidemia    Hypertension    Paroxysmal SVT (supraventricular tachycardia) ([place])    Pelvis fracture ([place]) 1992   Peripheral vascular disease (HCC)    Pleurisy    Pleurisy    Seasonal allergies  Family History: Family History  Problem Relation Age of Onset   Diabetes Sister     Social History   Socioeconomic History   Marital status: Single    Spouse name: Not on file   Number of children: Not on file   Years of education: Not on file   Highest education level: Not on file  Occupational History   Not on file  Tobacco Use   Smoking status: Former    Types: Cigarettes    Quit date: 02/24/1997    Years since quitting: 24.6   Smokeless tobacco: Never  Vaping Use   Vaping Use: Never used  Substance and Sexual Activity   Alcohol use: No   Drug use: No   Sexual activity: Not on file  Other Topics Concern   Not on file  Social History Narrative   Not on file   Social Determinants of Health   Financial Resource Strain: Low Risk    Difficulty of Paying Living Expenses: Not very hard  Food Insecurity: No Food Insecurity   Worried About Running Out of Food in the Last Year: Never true   [place] in the Last Year: Never true   Transportation Needs: No Transportation Needs   Lack of Transportation (Medical): No   Lack of Transportation (Non-Medical): No  Physical Activity: Not on file  Stress: Not on file  Social Connections: Not on file  Intimate Partner Violence: Not on file      Review of Systems  Constitutional:  Negative for chills, fatigue and unexpected weight change.  HENT:  Negative for congestion, rhinorrhea, sneezing and sore throat.   Eyes:  Negative for redness.  Respiratory:  Negative for cough, chest tightness and shortness of breath.   Cardiovascular:  Negative for chest pain and palpitations.  Gastrointestinal:  Negative for abdominal pain, constipation, diarrhea, nausea and vomiting.  Genitourinary:  Negative for dysuria and frequency.  Musculoskeletal:  Positive for gait problem. Negative for arthralgias, back pain, joint swelling and neck pain.  Skin:  Negative for rash.  Neurological:  Negative for tremors and numbness.  Hematological:  Negative for adenopathy. Does not bruise/bleed easily.  Psychiatric/Behavioral:  Negative for behavioral problems (Depression), sleep disturbance and suicidal ideas. The patient is not nervous/anxious.    Vital Signs: BP 113/60    Pulse 96    Temp 97.8 F (36.6 C)    Resp 16    Ht 5\' 10"  (1.778 m)    Wt 151 lb (68.5 kg)    SpO2 95%    BMI 21.67 kg/m    Physical Exam Vitals and nursing note reviewed.  Constitutional:      General: He is not in acute distress.    Appearance: He is well-developed. He is not diaphoretic.  HENT:     Head: Normocephalic and atraumatic.     Mouth/Throat:     Pharynx: No oropharyngeal exudate.  Eyes:     Pupils: Pupils are equal, round, and reactive to light.  Neck:     Thyroid: No thyromegaly.     Vascular: No JVD.     Trachea: No tracheal deviation.  Cardiovascular:     Rate and Rhythm: Normal rate and regular rhythm.     Heart sounds: Normal heart sounds. No murmur heard.   No friction rub. No gallop.   Pulmonary:     Effort: Pulmonary effort is normal. No respiratory distress.     Breath sounds: No wheezing or rales.  Chest:  Chest wall: No tenderness.  Abdominal:     General: Bowel sounds are normal.     Palpations: Abdomen is soft.  Musculoskeletal:        General: Normal range of motion.     Cervical back: Normal range of motion and neck supple.     Comments: LLE BKA  Lymphadenopathy:     Cervical: No cervical adenopathy.  Skin:    General: Skin is warm and dry.  Neurological:     Mental Status: He is alert and oriented to person, place, and time.     Cranial Nerves: No cranial nerve deficit.     Gait: Gait abnormal.  Psychiatric:        Behavior: Behavior normal.        Thought Content: Thought content normal.        Judgment: Judgment normal.       Assessment/Plan: 1. Uncontrolled type 2 diabetes mellitus with hyperglycemia (HCC) - POCT HgB A1Cis 9.5 which is very increased from 6.9 last visit. Patient admits he has not been watching what he eats and was educated on the importance of improving diet. Will continue glyburide as before and add jardiance with samples given. Pt educated on need for improvement and that insulin or endocrinology would have to be considered if A1c did not start improving again.  2. Essential hypertension Stable, continue current medications  3. S/P below knee amputation, left (HCC) Stable, cleared by vascular for 1 year, continues to work with PT for prosthesis  4. Primary osteoarthritis of shoulders, bilateral May continue tramadol as needed - traMADol (ULTRAM) 50 MG tablet; Take 1 tablet (50 mg total) by mouth every 12 (twelve) hours as needed.  Dispense: 45 tablet; Refill: 2  5. COPD with hypoxia ([place]) Continue oxygen at night and inhaler--will have visit to discuss patient assistance for inhaler with pharmacist   General Counseling: Delores verbalizes understanding of the findings of todays visit and agrees with plan of treatment.  I have discussed any further diagnostic evaluation that may be needed or ordered today. We also reviewed his medications today. he has been encouraged to call the office with any questions or concerns that should arise related to todays visit.    Orders Placed This Encounter  Procedures   POCT HgB A1C    Meds ordered this encounter  Medications   empagliflozin (JARDIANCE) 25 MG TABS tablet    Sig: Take 1 tablet (25 mg total) by mouth daily.    Dispense:  90 tablet    Refill:  1   traMADol (ULTRAM) 50 MG tablet    Sig: Take 1 tablet (50 mg total) by mouth every 12 (twelve) hours as needed.    Dispense:  45 tablet    Refill:  2    This patient was seen by Drema Dallas, PA-C in collaboration with Dr. Clayborn Bigness as a part of collaborative care agreement.   Total time spent:35 Minutes Time spent includes review of chart, medications, test results, and follow up plan with the patient.      Dr [name] Internal medicine

## 2021-09-25 DIAGNOSIS — R2689 Other abnormalities of gait and mobility: Secondary | ICD-10-CM | POA: Diagnosis not present

## 2021-09-25 DIAGNOSIS — Z89512 Acquired absence of left leg below knee: Secondary | ICD-10-CM | POA: Diagnosis not present

## 2021-09-25 DIAGNOSIS — R262 Difficulty in walking, not elsewhere classified: Secondary | ICD-10-CM | POA: Diagnosis not present

## 2021-09-29 DIAGNOSIS — Z89512 Acquired absence of left leg below knee: Secondary | ICD-10-CM | POA: Diagnosis not present

## 2021-09-29 DIAGNOSIS — R2689 Other abnormalities of gait and mobility: Secondary | ICD-10-CM | POA: Diagnosis not present

## 2021-09-29 DIAGNOSIS — R262 Difficulty in walking, not elsewhere classified: Secondary | ICD-10-CM | POA: Diagnosis not present

## 2021-09-30 ENCOUNTER — Other Ambulatory Visit: Payer: Self-pay

## 2021-09-30 ENCOUNTER — Other Ambulatory Visit: Payer: Self-pay | Admitting: Physician Assistant

## 2021-09-30 ENCOUNTER — Ambulatory Visit: Payer: Medicare HMO | Admitting: Student-PharmD

## 2021-09-30 DIAGNOSIS — I10 Essential (primary) hypertension: Secondary | ICD-10-CM

## 2021-09-30 DIAGNOSIS — E782 Mixed hyperlipidemia: Secondary | ICD-10-CM

## 2021-09-30 DIAGNOSIS — J449 Chronic obstructive pulmonary disease, unspecified: Secondary | ICD-10-CM

## 2021-09-30 DIAGNOSIS — R0902 Hypoxemia: Secondary | ICD-10-CM

## 2021-09-30 DIAGNOSIS — E1165 Type 2 diabetes mellitus with hyperglycemia: Secondary | ICD-10-CM

## 2021-09-30 MED ORDER — BUDESONIDE-FORMOTEROL FUMARATE 80-4.5 MCG/ACT IN AERO: 2.0000 | INHALATION_SPRAY | Freq: Two times a day (BID) | RESPIRATORY_TRACT | 3 refills | Status: DC

## 2021-09-30 NOTE — Progress Notes (Signed)
Follow Up Pharmacist Visit   [name], [name] 27 years, Male  DOB: 27-Jun-1951  M: [phone]  Summary: Patient has been taking Jardiance 10mg  from samples provided and reports his fasting blood sugars have begun to improve. Patient cannot afford his Dulera inhaler and there is not a PAP for this inhaler currently. Will discuss with PCP about an inhaler change. Counseled patient on diet changes to improve his sugars. Patient is not currently on an ACE/ARB for kidney protection, as his blood pressure dropped too low. Will monitor BP for now with new lower dose of metoprolol.  Recommendations/Changes made from today's visit: Continue current medication therapy. Continue to check blood sugar once daily and keep a log.  Plan: F/u in 4 months   Patient Chart Prep  Chronic Conditions Patient's Chronic Conditions: Cardiovascular Disease (CVD), Hypertension (HTN), Gastroesophageal Reflux Disease (GERD), Diabetes (DM), Osteoarthritis, Hyperlipidemia/Dyslipidemia (HLD), Chronic Obstructive Pulmonary Disease (COPD)  Doctor and Hospital Visits Were there PCP Visits since last visit with the Pharmacist?: Yes Visit #1: 09/24/21 Mylinda Latina, PA-C. For uncontrolled type 2 diabetes mellitus. STARTED Empagliflozin 25 mg daily and CONTUINE Metoprolol 25 mg 1 tablet daily. STOPPED Metoprolol 50 mg daily. Were there Specialist Visits since last visit with the Pharmacist?: Yes Visit #1: 08/06/21 Urology Telephone STARTED Fosfomycin Tromethamine 3 g oral once. STOPPED Fluconazole and Nystain-Triamcinolone. Visit #2: 09/01/21 Urology Billey Co, MD. For recurrent UTI. No medication changes. Visit #3: 09/02/21 Cardiology Paraschos, Sheppard Coil, MD/Drane, Jeraldine Loots, PA. For Cardiomyopathy PER NOTE: Reduce metoprolol succinate to 25 mg daily Was there a Hospital Visit in last 30 days?: No Were there other Hospital Visits since last visit with the Pharmacist?: No  Medication  Information Have there been any medication changes from PCP or Specialist since last visit with the Pharmacist?: Yes Details:  STARTED Empagliflozin 25 mg daily and CONTUINE Metoprolol 25 mg 1 tablet daily. STOPPED Metoprolol 50 mg daily. STARTED Fosfomycin Tromethamine 3 g oral once. STOPPED Fluconazole and Nystain-Triamcinolone. Are there any Medication adherence gaps (beyond 5 days past due)?: No Medication adherence rates for the STAR rating drugs: Lovastatin 10 mg 08/07/21 90 DS List Patient's current Care Gaps: No current Care Gaps identified  Pre-Call Questions  Are you able to connect with Patient?: Yes Visit Type: Phone Call May we confirm what is the best phone # for the pharmacist to call you?: [phone] Have you been in the hospital or emergency room recently?: No Do you have any questions or concerns that you want to make sure your pharmacist addresses with your during your appointment?: No What, if any, problems do you have getting your medications from the pharmacy?: None Is there anything else that you would like me to pass along to your pharmacist or PCP?: No Patient reminded to bring medication bottles to visit (not a list of meds) OR have medication bottles pulled out prior to appointment time: Done  Disease Assessments  Subjective Information Current BP: 113/60 Current HR: 96 taken on: 09/24/2021 Weight: 151 BMI: 21.67 Last GFR: >60 taken on: 04/19/2021 Why did the patient present?: CCM F/u Visit Is Patient using [facility]?: No Name and location of Current pharmacy: [facility] Current Rx insurance plan: [employer] Are meds synced by current pharmacy?: Yes Are meds delivered by current pharmacy?: Yes - by local pharmacy Would patient benefit from direct intervention of clinical lead in dispensing process to optimize clinical outcomes?: Yes Are [facility] pharmacy services available where patient lives?: Yes Is patient disadvantaged  to  use [facility]?: Yes Does patient experience delays in picking up medications due to transportation concerns (getting to pharmacy)?: No   Diabetes (DM) Current A1C: 9.5 taken on: 09/24/2021 Type: 2 The current microalbumin ratio is: <3.0 tested on: 01/03/2017 Last DM Foot Exam on: 11/18/2020 Last DM Eye Exam on: 09/01/2021 Assess this condition today?: Yes Goal A1C: < 7.0 % Type: 2 Is Patient taking statin medication: Yes Is patient taking ACEi / ARB?: No Reason patient is not taking ACEi / ARB: Was dizzy with lisinopril Does Patient use RPM device?: No DM RPM device: Does patient qualify?: No Patient checking Blood Sugar at home?: Yes Does patient use a Continuous Glucose Monitoring (CGM) device?: No How often does patient check Blood Sugars?: once daily fasting Recent fasting Blood sugar reading: 101-281 List average: 177 Has patient experienced any hypoglycemic episodes?: No Diet recall discussed: Yes Breakfast: yogurt, brown sugar oatmeal, eggs, sausage, protein drink Lunch: can of campbell soup Supper: soup from lunch Snacks: nab crackers Beverages: water, cup of ceffee We discussed: How to recognize and treat signs of hypoglycemia., Diet and exercise extensively, Low carbohydrate eating plan with an emphasis on whole grains, legumes, nuts, fruits, and vegetables and minimal refined and processed foods. Assessment:: Uncontrolled Drug: Jardiance 25mg  1 tablet daily Assessment: Appropriate, Effective, Safe, Accessible Drug: Glyburide 5mg  1 tablet twice daily with a meal Assessment: Appropriate, Effective, Safe, Accessible Additional Info: Patient just recently started the Jardiance 10mg  and plan is to increase to 25mg  after one month if tolerated. Will monitor for now Plan to (other): Continue current medication therapy HC Follow up: 1 month, 3 month Pharmacist Follow up: 4 months  Chronic Obstructive Pulmonary Disease (COPD) Assess this condition today?:  Yes Gold group: A (low sx, < 2 exacerbations / yr) Exacerbations since last visit with pharmacist?: No Has there been change in patients smoking/vaping habit since last visit with pharmacist?: No Home oxygen therapy: Yes Details: uses at night Frequency of SABA/SAMA use: Never We discussed: Inhaler technique Assessment:: Controlled Drug: Dulera 100-5 2 puffs twice daily as needed Assessment: Appropriate, Effective, Safe, Query Accessibility Additional Info: Patient states he cannot afford his Dulera. Will consult with PCP about inhaler change to one that offers PAP, such as symbicort Plan to (other): Continue current medication therapy. Consult with PCP about a more affordable inhaler HC Follow up: 2 months Pharmacist Follow up: 4 months  Exercise, Diet and Non-Drug Coordination Needs Additional diet counseling points. We discussed: key components of a low-carb eating plan Discussed Non-Drug Care Coordination Needs: Yes Does Patient have Medication financial barriers?: Yes Patient has insurance  Designer, fashion/clothing): [employer] and has trouble affording medications  (list number): Dulera Their current copays are: $95 for 1 month Goal: (pharmacist to fill in plan to resolve) i.e. change medications, samples, PAP.: Consult PCP about med change and possible PAP Patient meets income/out of pocket spend criteria for this medications patient assistance program. Reviewed application process, patient will provide proof of income, out of pocket spend report, and will sign application.  Will collaborate with PCP, and will submit for patient.: No  Accountable Health Communities Health-Related Social Needs Screening Tool -  SDOH  What is your living situation today? (ref #1): I have a steady place to live Think about the place you live. Do you have problems with any of the following? (ref #2): None of the above Within the past 12 months, you worried that your food would run out before you got money to  buy  more (ref #3): Never true Within the past 12 months, the food you bought just didn't last and you didn't have money to get more (ref #4): Never true In the past 12 months, has lack of reliable transportation kept you from medical appointments, meetings, work or from getting things needed for daily living? (ref #5): No In the past 12 months, has the electric, gas, oil, or water company threatened to shut off services in your home? (ref #6): No How often does anyone, including family and friends, physically hurt you? (ref #7): Never (1) How often does anyone, including family and friends, insult or talk down to you? (ref #8): Never (1) How often does anyone, including friends and family, threaten you with harm? (ref #9): Never (1) How often does anyone, including family and friends, scream or curse at you? (ref #10): Never (1)         Diabetes Mellitus and Nutrition, Adult When you have diabetes, or diabetes mellitus, it is very important to have healthy eating habits because your blood sugar (glucose) levels are greatly affected by what you eat and drink. Eating healthy foods in the right amounts, at about the same times every day, can help you: Manage your blood glucose. Lower your risk of heart disease. Improve your blood pressure. Reach or maintain a healthy weight. What can affect my meal plan? Every person with diabetes is different, and each person has different needs for a meal plan. Your health care provider may recommend that you work with a dietitian to make a meal plan that is best for you. Your meal plan may vary depending on factors such as: The calories you need. The medicines you take. Your weight. Your blood glucose, blood pressure, and cholesterol levels. Your activity level. Other health conditions you have, such as heart or kidney disease. How do carbohydrates affect me? Carbohydrates, also called carbs, affect your blood glucose level more than any other type of  food. Eating carbs raises the amount of glucose in your blood. It is important to know how many carbs you can safely have in each meal. This is different for every person. Your dietitian can help you calculate how many carbs you should have at each meal and for each snack. How does alcohol affect me? Alcohol can cause a decrease in blood glucose (hypoglycemia), especially if you use insulin or take certain diabetes medicines by mouth. Hypoglycemia can be a life-threatening condition. Symptoms of hypoglycemia, such as sleepiness, dizziness, and confusion, are similar to symptoms of having too much alcohol. Do not drink alcohol if: Your health care provider tells you not to drink. You are pregnant, may be pregnant, or are planning to become pregnant. If you drink alcohol: Limit how much you have to: 0-1 drink a day for women. 0-2 drinks a day for men. Know how much alcohol is in your drink. In the [place] one drink equals one 12 oz bottle of beer (355 mL), one 5 oz glass of wine (148 mL), or one 1 oz glass of hard liquor (44 mL). Keep yourself hydrated with water, diet soda, or unsweetened iced tea. Keep in mind that regular soda, juice, and other mixers may contain a lot of sugar and must be counted as carbs. What are tips for following this plan? Reading food labels Start by checking the serving size on the Nutrition Facts label of packaged foods and drinks. The number of calories and the amount of carbs, fats, and other nutrients listed on the label  are based on one serving of the item. Many items contain more than one serving per package. Check the total grams (g) of carbs in one serving. Check the number of grams of saturated fats and trans fats in one serving. Choose foods that have a low amount or none of these fats. Check the number of milligrams (mg) of salt (sodium) in one serving. Most people should limit total sodium intake to less than 2,300 mg per day. Always check the nutrition  information of foods labeled as "low-fat" or "nonfat." These foods may be higher in added sugar or refined carbs and should be avoided. Talk to your dietitian to identify your daily goals for nutrients listed on the label. Shopping Avoid buying canned, pre-made, or processed foods. These foods tend to be high in fat, sodium, and added sugar. Shop around the outside edge of the grocery store. This is where you will most often find fresh fruits and vegetables, bulk grains, fresh meats, and fresh dairy products. Cooking Use low-heat cooking methods, such as baking, instead of high-heat cooking methods, such as deep frying. Cook using healthy oils, such as olive, canola, or sunflower oil. Avoid cooking with butter, cream, or high-fat meats. Meal planning Eat meals and snacks regularly, preferably at the same times every day. Avoid going long periods of time without eating. Eat foods that are high in fiber, such as fresh fruits, vegetables, beans, and whole grains. Eat 4-6 oz (112-168 g) of lean protein each day, such as lean meat, chicken, fish, eggs, or tofu. One ounce (oz) (28 g) of lean protein is equal to: 1 oz (28 g) of meat, chicken, or fish. 1 egg.  cup (62 g) of tofu. Eat some foods each day that contain healthy fats, such as avocado, nuts, seeds, and fish. What foods should I eat? Fruits Berries. Apples. Oranges. Peaches. Apricots. Plums. Grapes. Mangoes. Papayas. Pomegranates. Kiwi. Cherries. Vegetables Leafy greens, including lettuce, spinach, kale, chard, collard greens, mustard greens, and cabbage. Beets. Cauliflower. Broccoli. Carrots. Green beans. Tomatoes. Peppers. Onions. Cucumbers. Brussels sprouts. Grains Whole grains, such as whole-wheat or whole-grain bread, crackers, tortillas, cereal, and pasta. Unsweetened oatmeal. Quinoa. Brown or wild rice. Meats and other proteins Seafood. Poultry without skin. Lean cuts of poultry and beef. Tofu. Nuts. Seeds. Dairy Low-fat or  fat-free dairy products such as milk, yogurt, and cheese. The items listed above may not be a complete list of foods and beverages you can eat and drink. Contact a dietitian for more information. What foods should I avoid? Fruits Fruits canned with syrup. Vegetables Canned vegetables. Frozen vegetables with butter or cream sauce. Grains Refined white flour and flour products such as bread, pasta, snack foods, and cereals. Avoid all processed foods. Meats and other proteins Fatty cuts of meat. Poultry with skin. Breaded or fried meats. Processed meat. Avoid saturated fats. Dairy Full-fat yogurt, cheese, or milk. Beverages Sweetened drinks, such as soda or iced tea. The items listed above may not be a complete list of foods and beverages you should avoid. Contact a dietitian for more information. Questions to ask a health care provider Do I need to meet with a certified diabetes care and education specialist? Do I need to meet with a dietitian? What number can I call if I have questions? When are the best times to check my blood glucose? Where to find more information: American Diabetes Association: diabetes.org Academy of Nutrition and Dietetics: eatright.[employer] of Diabetes and Digestive and Kidney Diseases: AmenCredit.is Association of Diabetes  Care & Education Specialists: diabeteseducator.org Summary It is important to have healthy eating habits because your blood sugar (glucose) levels are greatly affected by what you eat and drink. It is important to use alcohol carefully. A healthy meal plan will help you manage your blood glucose and lower your risk of heart disease. Your health care provider may recommend that you work with a dietitian to make a meal plan that is best for you. This information is not intended to replace advice given to you by your health care provider. Make sure you discuss any questions you have with your health care provider.  Alena Bills Clinical Pharmacist [phone]

## 2021-10-01 DIAGNOSIS — R262 Difficulty in walking, not elsewhere classified: Secondary | ICD-10-CM | POA: Diagnosis not present

## 2021-10-01 DIAGNOSIS — R2689 Other abnormalities of gait and mobility: Secondary | ICD-10-CM | POA: Diagnosis not present

## 2021-10-01 DIAGNOSIS — Z89512 Acquired absence of left leg below knee: Secondary | ICD-10-CM | POA: Diagnosis not present

## 2021-10-02 ENCOUNTER — Telehealth: Payer: Self-pay | Admitting: Student-PharmD

## 2021-10-02 NOTE — Progress Notes (Signed)
°  Chronic Care Management Pharmacy Assistant   Name: [name]  MRN: [id number] DOB: 1951-09-01  Reason for Encounter: PAP  Medications: Outpatient Encounter Medications as of 10/02/2021  Medication Sig   budesonide-formoterol (SYMBICORT) 80-4.5 MCG/ACT inhaler Inhale 2 puffs into the lungs 2 (two) times daily.   acetaminophen (TYLENOL) 325 MG tablet Take 650 mg by mouth every 6 (six) hours as needed for moderate pain.   aspirin EC 81 MG tablet Take 81 mg by mouth daily.   Calcium Carbonate-Vitamin D (CALCIUM PLUS VITAMIN D PO) Take 1 tablet by mouth daily.   clopidogrel (PLAVIX) 75 MG tablet Take 1 tablet (75 mg total) by mouth daily.   cyclobenzaprine (FLEXERIL) 10 MG tablet Take 1 tablet (10 mg total) by mouth 3 (three) times daily as needed for muscle spasms.   diphenhydrAMINE (BENADRYL) 25 mg capsule Take 25 mg by mouth every 6 (six) hours as needed for allergies.   empagliflozin (JARDIANCE) 25 MG TABS tablet Take 1 tablet (25 mg total) by mouth daily.   gabapentin (NEURONTIN) 300 MG capsule TAKE 1 CAPSULE THREE TIMES DAILY   glucose blood (TRUE METRIX BLOOD GLUCOSE TEST) test strip TEST BLOOD SUGAR TWICE DAILY AS DIRECTED   glyBURIDE (DIABETA) 5 MG tablet TAKE 1 TABLET TWICE DAILY WITH MEALS   lovastatin (MEVACOR) 10 MG tablet Take 1 tab  Po QHS   meloxicam (MOBIC) 15 MG tablet Take 15 mg by mouth daily.   metoprolol succinate (TOPROL-XL) 25 MG 24 hr tablet Take 1 tablet by mouth daily.   mometasone-formoterol (DULERA) 100-5 MCG/ACT AERO Inhale 2 puffs into the lungs 2 (two) times daily as needed for wheezing.   pantoprazole (PROTONIX) 40 MG tablet    traMADol (ULTRAM) 50 MG tablet Take 1 tablet (50 mg total) by mouth every 12 (twelve) hours as needed.   TRUEplus Lancets 30G MISC by Does not apply route. Use as directed twice a daily Dx E11.65   No facility-administered encounter medications on file as of 10/02/2021.    New patient assistance application form filled out to [employer] and [employer] for Jardiance and Symbicort. Waiting for patient and provider to complete and sign documentation. Called patient to inquire if they wanted the application mailed to them or if they wanted to come into the office. Patient is required to sign application and to bring/have proof of income. He stated he would be willing to come into office to bring proof of income and sign application once the paperwork came in the mail he would like it mailed to their residence address [address] [place] [address].  [name], [place] Clinical Pharmacist Assistant [phone]

## 2021-10-04 DIAGNOSIS — J439 Emphysema, unspecified: Secondary | ICD-10-CM | POA: Diagnosis not present

## 2021-10-07 DIAGNOSIS — R262 Difficulty in walking, not elsewhere classified: Secondary | ICD-10-CM | POA: Diagnosis not present

## 2021-10-07 DIAGNOSIS — R2689 Other abnormalities of gait and mobility: Secondary | ICD-10-CM | POA: Diagnosis not present

## 2021-10-07 DIAGNOSIS — Z89512 Acquired absence of left leg below knee: Secondary | ICD-10-CM | POA: Diagnosis not present

## 2021-10-08 ENCOUNTER — Other Ambulatory Visit: Payer: Self-pay

## 2021-10-08 ENCOUNTER — Ambulatory Visit: Payer: Medicare HMO | Admitting: Physician Assistant

## 2021-10-08 NOTE — Patient Outreach (Signed)
[facility]) Care Management  10/08/2021  [name] 12-22-1950 [id number]   Case Closure   Patient being followed by CCM services provided by PCP office.     Plan: RN CM will close case.  Enzo Montgomery, RN,BSN,CCM [place] Management Telephonic Care Management Coordinator Direct Phone: [phone] Toll Free: [phone] Fax: [phone]

## 2021-10-09 ENCOUNTER — Other Ambulatory Visit: Payer: Self-pay

## 2021-10-09 DIAGNOSIS — R2689 Other abnormalities of gait and mobility: Secondary | ICD-10-CM | POA: Diagnosis not present

## 2021-10-09 DIAGNOSIS — Z89512 Acquired absence of left leg below knee: Secondary | ICD-10-CM | POA: Diagnosis not present

## 2021-10-09 DIAGNOSIS — R262 Difficulty in walking, not elsewhere classified: Secondary | ICD-10-CM | POA: Diagnosis not present

## 2021-10-13 ENCOUNTER — Ambulatory Visit: Payer: Self-pay

## 2021-10-14 DIAGNOSIS — Z89512 Acquired absence of left leg below knee: Secondary | ICD-10-CM | POA: Diagnosis not present

## 2021-10-14 DIAGNOSIS — R2689 Other abnormalities of gait and mobility: Secondary | ICD-10-CM | POA: Diagnosis not present

## 2021-10-14 DIAGNOSIS — R262 Difficulty in walking, not elsewhere classified: Secondary | ICD-10-CM | POA: Diagnosis not present

## 2021-10-19 DIAGNOSIS — E1142 Type 2 diabetes mellitus with diabetic polyneuropathy: Secondary | ICD-10-CM | POA: Diagnosis not present

## 2021-10-19 DIAGNOSIS — B351 Tinea unguium: Secondary | ICD-10-CM | POA: Diagnosis not present

## 2021-10-19 DIAGNOSIS — Z89512 Acquired absence of left leg below knee: Secondary | ICD-10-CM | POA: Diagnosis not present

## 2021-10-19 DIAGNOSIS — I739 Peripheral vascular disease, unspecified: Secondary | ICD-10-CM | POA: Diagnosis not present

## 2021-10-21 ENCOUNTER — Other Ambulatory Visit: Payer: Self-pay | Admitting: Internal Medicine

## 2021-10-21 DIAGNOSIS — R262 Difficulty in walking, not elsewhere classified: Secondary | ICD-10-CM | POA: Diagnosis not present

## 2021-10-21 DIAGNOSIS — R2689 Other abnormalities of gait and mobility: Secondary | ICD-10-CM | POA: Diagnosis not present

## 2021-10-21 DIAGNOSIS — Z89512 Acquired absence of left leg below knee: Secondary | ICD-10-CM | POA: Diagnosis not present

## 2021-10-22 ENCOUNTER — Telehealth: Payer: Self-pay | Admitting: Student-PharmD

## 2021-10-22 NOTE — Progress Notes (Addendum)
Hypertension (HTN) Review Call   [place] L [id number] 51 years, Male  DOB: 08-30-1951  M: [phone]  Hypertension Review (HC) Completed by Charlann Lange on 10/22/2021  Chart Review Is the patient enrolled in RPM with BP Monitor?: No  BP #1 reading (last): 113/60 on: 09/24/2021  BP #2 reading: 102/58 on: 09/02/2021  BP #3 reading: 128/82 on: 09/01/2021  Any of the last 3 BP > 140/90 mmHg?: No  What recent interventions/DTPs have been made by any provider to improve the patient's conditions in the last 3 months?:   Consults: 10/19/21 Podiatry Vickki Muff, Marlan Palau, DPM. For Recheck right foot. No medication changes.  Any recent hospitalizations or ED visits since last visit with CPP?: No  Adherence Review Adherence rates for STAR metric medications: Lovastatin 10 mg 08/07/21 90 DS Jardiance 25 mg 09/24/21 90 DS  Adherence rates for medications indicated for disease state being reviewed: None.  Does the patient have >5 day gap between last estimated fill dates for any of the above medications?: No  Disease State Questions Able to connect with the Patient?: Yes  Is the patient monitoring his/her BP?: Yes  How often are you checking your BP?: occasionally  Home BP Reading #1 (most recent): 120/60  Is the patient having any low BP Readings <90/60?: No  Is the patient having any BP readings above >180/100?: No  Is the patient's average BP>140/90?: No  What is your blood pressure goal?: 120/80  Educate patient to inform proper points on checking BP at home:: Sit with feet flat on the floor, arm at heart level., Do not drink caffeine or smoke a cigarette at least 30 min. prior to checking.  What diet changes have you made to improve your Blood Pressure Control?: eating more home-cooked meals  What exercise are you doing to improve your Blood Pressure Control?: other  Details: Patient stated he is still doing some form of physical therapy.   Charlann Lange, [place]  [phone]  Pharmacist Review  Adherence gaps identified?: No Drug Therapy Problems identified?: No Assessment: Controlled  Alena Bills Clinical Pharmacist [phone]

## 2021-10-23 ENCOUNTER — Other Ambulatory Visit: Payer: Self-pay | Admitting: Internal Medicine

## 2021-10-23 DIAGNOSIS — E1165 Type 2 diabetes mellitus with hyperglycemia: Secondary | ICD-10-CM

## 2021-10-25 DIAGNOSIS — E1165 Type 2 diabetes mellitus with hyperglycemia: Secondary | ICD-10-CM | POA: Diagnosis not present

## 2021-10-25 DIAGNOSIS — I10 Essential (primary) hypertension: Secondary | ICD-10-CM | POA: Diagnosis not present

## 2021-10-25 DIAGNOSIS — K219 Gastro-esophageal reflux disease without esophagitis: Secondary | ICD-10-CM | POA: Diagnosis not present

## 2021-10-26 ENCOUNTER — Other Ambulatory Visit: Payer: Self-pay | Admitting: Internal Medicine

## 2021-10-26 DIAGNOSIS — M542 Cervicalgia: Secondary | ICD-10-CM

## 2021-10-26 DIAGNOSIS — G8929 Other chronic pain: Secondary | ICD-10-CM

## 2021-10-28 DIAGNOSIS — R2689 Other abnormalities of gait and mobility: Secondary | ICD-10-CM | POA: Diagnosis not present

## 2021-10-28 DIAGNOSIS — Z89512 Acquired absence of left leg below knee: Secondary | ICD-10-CM | POA: Diagnosis not present

## 2021-10-28 DIAGNOSIS — R262 Difficulty in walking, not elsewhere classified: Secondary | ICD-10-CM | POA: Diagnosis not present

## 2021-10-30 ENCOUNTER — Telehealth: Payer: Self-pay

## 2021-11-03 ENCOUNTER — Telehealth: Payer: Self-pay | Admitting: Student-PharmD

## 2021-11-03 NOTE — Progress Notes (Addendum)
Diabetes (DM) Review Call   [name] [id number] 53 years, Male  DOB: [birth year]  M: [phone]  Diabetes (DM) Review (HC) Completed by Charlann Lange on 11/03/2021  Chart Review Is the patient enrolled in RPM with the glucometer?: No  A1C Reading #1 (last): 9.5 on: 09/24/2021  A1C Reading #2: 6.9 on: 06/21/2021  When was patient's last eye exam?: 09/01/2021  The patient's glycemic control has: Worsened (> 0.3 increase)  What recent interventions have been made by any provider to improve the patient's conditions in the last 3 months?: None.  Any recent hospitalizations or ED visits since last visit with CPP?: No  Is the patient currently on a STATIN medication?: Yes  Is the patient currently on ACE/ARB medication?: No  Adherence Review Adherence rates for STAR metric medications:  Jardiance 25 mg 09/24/21 90 DS Lovastatin 10 mg 10/22/21 90 DS  Adherence rates for medications indicated for disease state being reviewed: Jardiance 25 mg 09/24/21 90 DS  Does the patient have >5 day gap between last estimated fill dates for any of the above medications?: No  Disease State Questions Able to connect with the Patient?: Yes  Are you currently checking your blood sugars?: Yes  How often are you checking your blood sugar?: occasionally  Recent fasting blood sugars: 169 139, 121, 164, 159.  Is the patient having any lows <70?: No  Is the patient having any fasting blood sugars above >170?: No  Is the patient checking their feet daily/regularly?: Yes  Are there any cuts, swelling, blisters, redness, or any signs of infections?: No  What diet changes have you made to improve your Blood Sugar level?: eating more home-cooked meals, limiting processed foods  What exercise are you doing to improve your Blood Sugar level?: no formal exercise, other  Details: Patient stated he does do physical therapy.   Engagement Notes Charlann Lange on 11/03/2021 02:21  PM [facility] Chart Review: 10 min 11/03/21  [facility] Assessment call time spent: 5 min 11/03/21 CPP Office Visit Documentation: CPP Coordination of Care: CPP Care Plan Review:  Charlann Lange, [place]  [phone]  Pharmacist Review Adherence gaps identified?: No Drug Therapy Problems identified?: No Assessment: Uncontrolled Plan: has f/u for A1C recheck scheduled for March with PCP  5 minutes spent in review, coordination, and documentation.  Reviewed by: Alena Bills, PharmD Clinical Pharmacist

## 2021-11-04 DIAGNOSIS — R262 Difficulty in walking, not elsewhere classified: Secondary | ICD-10-CM | POA: Diagnosis not present

## 2021-11-04 DIAGNOSIS — R2689 Other abnormalities of gait and mobility: Secondary | ICD-10-CM | POA: Diagnosis not present

## 2021-11-04 DIAGNOSIS — J439 Emphysema, unspecified: Secondary | ICD-10-CM | POA: Diagnosis not present

## 2021-11-04 DIAGNOSIS — Z89512 Acquired absence of left leg below knee: Secondary | ICD-10-CM | POA: Diagnosis not present

## 2021-11-10 ENCOUNTER — Encounter: Payer: Self-pay | Admitting: Internal Medicine

## 2021-11-12 ENCOUNTER — Other Ambulatory Visit: Payer: Self-pay | Admitting: Physician Assistant

## 2021-11-13 DIAGNOSIS — R2689 Other abnormalities of gait and mobility: Secondary | ICD-10-CM | POA: Diagnosis not present

## 2021-11-13 DIAGNOSIS — R262 Difficulty in walking, not elsewhere classified: Secondary | ICD-10-CM | POA: Diagnosis not present

## 2021-11-13 DIAGNOSIS — Z89512 Acquired absence of left leg below knee: Secondary | ICD-10-CM | POA: Diagnosis not present

## 2021-11-20 DIAGNOSIS — R2689 Other abnormalities of gait and mobility: Secondary | ICD-10-CM | POA: Diagnosis not present

## 2021-11-20 DIAGNOSIS — Z89512 Acquired absence of left leg below knee: Secondary | ICD-10-CM | POA: Diagnosis not present

## 2021-11-20 DIAGNOSIS — R262 Difficulty in walking, not elsewhere classified: Secondary | ICD-10-CM | POA: Diagnosis not present

## 2021-11-24 DIAGNOSIS — E1165 Type 2 diabetes mellitus with hyperglycemia: Secondary | ICD-10-CM

## 2021-11-24 DIAGNOSIS — I10 Essential (primary) hypertension: Secondary | ICD-10-CM

## 2021-11-24 DIAGNOSIS — K219 Gastro-esophageal reflux disease without esophagitis: Secondary | ICD-10-CM

## 2021-11-25 DIAGNOSIS — Z89512 Acquired absence of left leg below knee: Secondary | ICD-10-CM | POA: Diagnosis not present

## 2021-11-25 DIAGNOSIS — R2689 Other abnormalities of gait and mobility: Secondary | ICD-10-CM | POA: Diagnosis not present

## 2021-11-25 DIAGNOSIS — R262 Difficulty in walking, not elsewhere classified: Secondary | ICD-10-CM | POA: Diagnosis not present

## 2021-12-02 DIAGNOSIS — R2689 Other abnormalities of gait and mobility: Secondary | ICD-10-CM | POA: Diagnosis not present

## 2021-12-02 DIAGNOSIS — R262 Difficulty in walking, not elsewhere classified: Secondary | ICD-10-CM | POA: Diagnosis not present

## 2021-12-02 DIAGNOSIS — Z89512 Acquired absence of left leg below knee: Secondary | ICD-10-CM | POA: Diagnosis not present

## 2021-12-02 DIAGNOSIS — J439 Emphysema, unspecified: Secondary | ICD-10-CM | POA: Diagnosis not present

## 2021-12-09 DIAGNOSIS — R2689 Other abnormalities of gait and mobility: Secondary | ICD-10-CM | POA: Diagnosis not present

## 2021-12-09 DIAGNOSIS — R262 Difficulty in walking, not elsewhere classified: Secondary | ICD-10-CM | POA: Diagnosis not present

## 2021-12-09 DIAGNOSIS — Z89512 Acquired absence of left leg below knee: Secondary | ICD-10-CM | POA: Diagnosis not present

## 2021-12-16 DIAGNOSIS — R262 Difficulty in walking, not elsewhere classified: Secondary | ICD-10-CM | POA: Diagnosis not present

## 2021-12-16 DIAGNOSIS — R2689 Other abnormalities of gait and mobility: Secondary | ICD-10-CM | POA: Diagnosis not present

## 2021-12-16 DIAGNOSIS — Z89512 Acquired absence of left leg below knee: Secondary | ICD-10-CM | POA: Diagnosis not present

## 2021-12-24 ENCOUNTER — Encounter: Payer: Self-pay | Admitting: Physician Assistant

## 2021-12-24 ENCOUNTER — Ambulatory Visit (INDEPENDENT_AMBULATORY_CARE_PROVIDER_SITE_OTHER): Payer: Medicare HMO | Admitting: Physician Assistant

## 2021-12-24 VITALS — BP 130/78 | HR 64 | Temp 98.0°F | Resp 16 | Ht 70.0 in | Wt 161.2 lb

## 2021-12-24 DIAGNOSIS — R0902 Hypoxemia: Secondary | ICD-10-CM

## 2021-12-24 DIAGNOSIS — I10 Essential (primary) hypertension: Secondary | ICD-10-CM

## 2021-12-24 DIAGNOSIS — E1142 Type 2 diabetes mellitus with diabetic polyneuropathy: Secondary | ICD-10-CM

## 2021-12-24 DIAGNOSIS — Z89512 Acquired absence of left leg below knee: Secondary | ICD-10-CM | POA: Diagnosis not present

## 2021-12-24 DIAGNOSIS — I739 Peripheral vascular disease, unspecified: Secondary | ICD-10-CM

## 2021-12-24 DIAGNOSIS — J449 Chronic obstructive pulmonary disease, unspecified: Secondary | ICD-10-CM

## 2021-12-24 DIAGNOSIS — E1165 Type 2 diabetes mellitus with hyperglycemia: Secondary | ICD-10-CM | POA: Diagnosis not present

## 2021-12-24 DIAGNOSIS — E782 Mixed hyperlipidemia: Secondary | ICD-10-CM | POA: Diagnosis not present

## 2021-12-24 LAB — POCT GLYCOSYLATED HEMOGLOBIN (HGB A1C): Hemoglobin A1C: 6.7 % — AB (ref 4.0–5.6)

## 2021-12-24 NOTE — Progress Notes (Addendum)
[facility] ?[address] ?[place],  [address] ? ?Internal MEDICINE  ?Office Visit Note ? ?Patient Name: [name] ? [id number]  ?[id number] ? ?Date of Service: 01/08/2022 ? ?Chief Complaint  ?Patient presents with  ? Follow-up  ? Diabetes  ? Gastroesophageal Reflux  ? Hyperlipidemia  ? Hypertension  ? COPD  ? ? ?HPI ?Pt is here for routine follow up ?-BG fasting in the 100s, 113 this morning, forgot log at home that had his sugars and BP written down. Interested in a continuous monitor ?-Bp has been doing well also ?-breathing has been stable, using symbicort daily. Does wear 2L oxygen at night ?-next vascular appt in Oct, finished PT and is walking with prosthetic and cane now ?-States he sees cardiology on Monday ?-eye doctor the following Monday ?-may bring paper for diabetic shoes, pt does need diabetic shoes given he has had a previous amputation and had poor circulation due to PVD and his diabetes. ? ?Current Medication: ?Outpatient Encounter Medications as of 12/24/2021  ?Medication Sig  ? acetaminophen (TYLENOL) 325 MG tablet Take 650 mg by mouth every 6 (six) hours as needed for moderate pain.  ? aspirin EC 81 MG tablet Take 81 mg by mouth daily.  ? budesonide-formoterol (SYMBICORT) 80-4.5 MCG/ACT inhaler Inhale 2 puffs into the lungs 2 (two) times daily.  ? Calcium Carbonate-Vitamin D (CALCIUM PLUS VITAMIN D PO) Take 1 tablet by mouth daily.  ? clopidogrel (PLAVIX) 75 MG tablet Take 1 tablet (75 mg total) by mouth daily.  ? cyclobenzaprine (FLEXERIL) 10 MG tablet TAKE 1 TABLET THREE TIMES DAILY AS NEEDED FOR MUSCLE SPASM(S)  ? diphenhydrAMINE (BENADRYL) 25 mg capsule Take 25 mg by mouth every 6 (six) hours as needed for allergies.  ? empagliflozin (JARDIANCE) 25 MG TABS tablet Take 1 tablet (25 mg total) by mouth daily.  ? fosfomycin (MONUROL) 3 g PACK Take 3 g by mouth once.  ? gabapentin (NEURONTIN) 300 MG capsule TAKE 1 CAPSULE THREE TIMES DAILY  ? glucose blood (TRUE METRIX BLOOD  GLUCOSE TEST) test strip TEST BLOOD SUGAR TWICE DAILY AS DIRECTED  ? glyBURIDE (DIABETA) 5 MG tablet TAKE 1 TABLET TWICE DAILY WITH MEALS  ? lovastatin (MEVACOR) 10 MG tablet TAKE 1 TABLET AT BEDTIME  ? meloxicam (MOBIC) 15 MG tablet Take 15 mg by mouth daily.  ? metoprolol succinate (TOPROL-XL) 25 MG 24 hr tablet Take 1 tablet by mouth daily.  ? pantoprazole (PROTONIX) 40 MG tablet TAKE 1 TABLET EVERY DAY  ? traMADol (ULTRAM) 50 MG tablet Take 1 tablet (50 mg total) by mouth every 12 (twelve) hours as needed.  ? TRUEplus Lancets 30G MISC by Does not apply route. Use as directed twice a daily Dx E11.65  ? [DISCONTINUED] mometasone-formoterol (DULERA) 100-5 MCG/ACT AERO Inhale 2 puffs into the lungs 2 (two) times daily as needed for wheezing.  ? ?No facility-administered encounter medications on file as of 12/24/2021.  ? ? ?Surgical History: ?Past Surgical History:  ?Procedure Laterality Date  ? AMPUTATION Left 04/23/2021  ? Procedure: AMPUTATION BELOW KNEE;  Surgeon: [name], MD;  Location: [facility];  Service: General;  Laterality: Left;  ? AMPUTATION TOE Left 02/25/2017  ? Procedure: AMPUTATION TOE-LEFT 2ND MPJ;  Surgeon: Samara Deist, DPM;  Location: [facility];  Service: Podiatry;  Laterality: Left;  ? AMPUTATION TOE Left 10/20/2018  ? Procedure: TOE MPJ T2 LEFT;  Surgeon: Samara Deist, DPM;  Location: [facility];  Service: Podiatry;  Laterality: Left;  ? AMPUTATION  TOE Left 01/12/2019  ? Procedure: AMPUTATION LEFT 5TH TOE AND JOINT;  Surgeon: Samara Deist, DPM;  Location: [facility];  Service: Podiatry;  Laterality: Left;  ? BACK SURGERY  2008  ? BONE BIOPSY Left 11/21/2020  ? Procedure: BONE BIOPSY;  Surgeon: Samara Deist, DPM;  Location: [facility];  Service: Podiatry;  Laterality: Left;  ? CHOLECYSTECTOMY    ? COLONOSCOPY WITH PROPOFOL N/A 08/15/2017  ? Procedure: COLONOSCOPY WITH PROPOFOL;  Surgeon: Lollie Sails, MD;  Location: [facility];  Service: Endoscopy;  Laterality: N/A;  ? ENDARTERECTOMY  Right 05/12/2017  ? Procedure: ENDARTERECTOMY CAROTID;  Surgeon: [name], MD;  Location: [facility];  Service: Vascular;  Laterality: Right;  ? ENDARTERECTOMY FEMORAL Left 12/01/2018  ? Procedure: ENDARTERECTOMY FEMORAL;  Surgeon: [name], MD;  Location: [facility];  Service: Vascular;  Laterality: Left;  ? ERCP    ? FOOT SURGERY Right   ? x 3  ? IRRIGATION AND DEBRIDEMENT FOOT Left 11/21/2020  ? Procedure: IRRIGATION AND DEBRIDEMENT FOOT--with placement of wound vac;  Surgeon: Samara Deist, DPM;  Location: [facility];  Service: Podiatry;  Laterality: Left;  ? LOWER EXTREMITY ANGIOGRAM Left 12/01/2018  ? Procedure: LOWER EXTREMITY ANGIOGRAM;  Surgeon: [name], MD;  Location: [facility];  Service: Vascular;  Laterality: Left;  ? LOWER EXTREMITY ANGIOGRAPHY Left 10/30/2018  ? Procedure: LOWER EXTREMITY ANGIOGRAPHY;  Surgeon: [name], MD;  Location: [facility];  Service: Cardiovascular;  Laterality: Left;  ? LOWER EXTREMITY ANGIOGRAPHY Left 11/30/2018  ? Procedure: LOWER EXTREMITY ANGIOGRAPHY;  Surgeon: [name], MD;  Location: [facility];  Service: Cardiovascular;  Laterality: Left;  ? LOWER EXTREMITY ANGIOGRAPHY Left 11/17/2020  ? Procedure: Lower Extremity Angiography;  Surgeon: [name], MD;  Location: [facility];  Service: Cardiovascular;  Laterality: Left;  ? LOWER EXTREMITY ANGIOGRAPHY Left 04/22/2021  ? Procedure: Lower Extremity Angiography;  Surgeon: [name], MD;  Location: [facility];  Service: Cardiovascular;  Laterality: Left;  ? TEE WITHOUT CARDIOVERSION N/A 11/14/2020  ? Procedure: TRANSESOPHAGEAL ECHOCARDIOGRAM (TEE);  Surgeon: Corey Skains, MD;  Location: [facility];  Service: Cardiovascular;  Laterality: N/A;  ? ? ?Medical History: ?Past Medical History:  ?Diagnosis Date  ? Cholecystitis, chronic   ? Cholelithiasis   ? COPD (chronic obstructive pulmonary disease) ([place])   ? Diabetes mellitus without complication ([place])   ? Diabetic retinopathy  ([place])   ? Dyspnea   ? Fatty liver 2008  ? Fracture of clavicle 1992  ? left   ? Fracture of ribs, multiple 1992  ? 3 ribs  ? GERD (gastroesophageal reflux disease)   ? Hyperlipidemia   ? Hypertension   ? Paroxysmal SVT (supraventricular tachycardia) (HCC)   ? Pelvis fracture ([place]) 1992  ? Peripheral vascular disease ([place])   ? Pleurisy   ? Pleurisy   ? Seasonal allergies   ? ? ?Family History: ?Family History  ?Problem Relation Age of Onset  ? Diabetes Sister   ? ? ?Social History  ? ?Socioeconomic History  ? Marital status: Single  ?  Spouse name: Not on file  ? Number of children: Not on file  ? Years of education: Not on file  ? Highest education level: Not on file  ?Occupational History  ? Not on file  ?Tobacco Use  ? Smoking status: Former  ?  Types: Cigarettes  ?  Quit date: 02/24/1997  ?  Years since quitting: 24.8  ?  Smokeless tobacco: Never  ?Vaping Use  ? Vaping Use: Never used  ?Substance and Sexual Activity  ? Alcohol use: No  ? Drug use: No  ? Sexual activity: Not on file  ?Other Topics Concern  ? Not on file  ?Social History Narrative  ? Not on file  ? ?Social Determinants of Health  ? ?Financial Resource Strain: Low Risk   ? Difficulty of Paying Living Expenses: Not very hard  ?Food Insecurity: No Food Insecurity  ? Worried About Charity fundraiser in the Last Year: Never true  ? Ran Out of Food in the Last Year: Never true  ?Transportation Needs: No Transportation Needs  ? Lack of Transportation (Medical): No  ? Lack of Transportation (Non-Medical): No  ?Physical Activity: Not on file  ?Stress: Not on file  ?Social Connections: Not on file  ?Intimate Partner Violence: Not on file  ? ? ? ? ?Review of Systems  ?Constitutional:  Negative for chills, fatigue and unexpected weight change.  ?HENT:  Negative for congestion, rhinorrhea, sneezing and sore throat.   ?Eyes:  Negative for redness.  ?Respiratory:  Negative for cough, chest tightness and shortness of breath.   ?Cardiovascular:  Negative for chest  pain and palpitations.  ?Gastrointestinal:  Negative for abdominal pain, constipation, diarrhea, nausea and vomiting.  ?Genitourinary:  Negative for dysuria and frequency.  ?Musculoskeletal:  Positive for gait

## 2021-12-28 DIAGNOSIS — J449 Chronic obstructive pulmonary disease, unspecified: Secondary | ICD-10-CM | POA: Diagnosis not present

## 2021-12-28 DIAGNOSIS — I10 Essential (primary) hypertension: Secondary | ICD-10-CM | POA: Diagnosis not present

## 2021-12-28 DIAGNOSIS — J9611 Chronic respiratory failure with hypoxia: Secondary | ICD-10-CM | POA: Diagnosis not present

## 2021-12-28 DIAGNOSIS — I739 Peripheral vascular disease, unspecified: Secondary | ICD-10-CM | POA: Diagnosis not present

## 2021-12-28 DIAGNOSIS — I471 Supraventricular tachycardia: Secondary | ICD-10-CM | POA: Diagnosis not present

## 2021-12-28 DIAGNOSIS — R0902 Hypoxemia: Secondary | ICD-10-CM | POA: Diagnosis not present

## 2021-12-28 DIAGNOSIS — E119 Type 2 diabetes mellitus without complications: Secondary | ICD-10-CM | POA: Diagnosis not present

## 2021-12-28 DIAGNOSIS — I428 Other cardiomyopathies: Secondary | ICD-10-CM | POA: Diagnosis not present

## 2021-12-28 DIAGNOSIS — I5032 Chronic diastolic (congestive) heart failure: Secondary | ICD-10-CM | POA: Diagnosis not present

## 2021-12-29 ENCOUNTER — Telehealth: Payer: Self-pay

## 2021-12-29 MED ORDER — DEXCOM G6 SENSOR MISC
5 refills | Status: DC
Start: 2021-12-29 — End: 2023-09-08

## 2021-12-29 MED ORDER — DEXCOM G6 TRANSMITTER MISC: 5 refills | Status: DC

## 2021-12-29 MED ORDER — DEXCOM G6 RECEIVER DEVI: 5 refills | Status: DC

## 2021-12-29 NOTE — Telephone Encounter (Signed)
error 

## 2022-01-02 DIAGNOSIS — J439 Emphysema, unspecified: Secondary | ICD-10-CM | POA: Diagnosis not present

## 2022-01-04 ENCOUNTER — Telehealth: Payer: Self-pay

## 2022-01-04 DIAGNOSIS — E113313 Type 2 diabetes mellitus with moderate nonproliferative diabetic retinopathy with macular edema, bilateral: Secondary | ICD-10-CM | POA: Diagnosis not present

## 2022-01-04 NOTE — Telephone Encounter (Signed)
Form for diabetic shoe supplies given to Lauren to complete.  ?

## 2022-01-11 ENCOUNTER — Telehealth: Payer: Self-pay

## 2022-01-11 NOTE — Telephone Encounter (Signed)
Faxed [employer] for diabetic shoe [phone] ?

## 2022-01-12 ENCOUNTER — Encounter: Payer: Self-pay | Admitting: Internal Medicine

## 2022-01-12 ENCOUNTER — Other Ambulatory Visit (INDEPENDENT_AMBULATORY_CARE_PROVIDER_SITE_OTHER): Payer: Self-pay | Admitting: Nurse Practitioner

## 2022-01-12 ENCOUNTER — Encounter: Payer: Self-pay | Admitting: Physician Assistant

## 2022-01-12 ENCOUNTER — Telehealth: Payer: Self-pay

## 2022-01-12 NOTE — Telephone Encounter (Signed)
Called [employer] about the [facility] sensor, transmitter, and receiver.  They transferred me to the [facility] medical department [phone], or [phone] and I spoke to [place].  He requested a lot of information for patient and the provider Lauren who prescribed the devices.  Fritz Pickerel advised that it is several steps they have to take before the devices are actually approved.  They had to get pt registered with them, then he advised they will get insurance verified, then call and speak to the patient to get their approval, then will fax [place] some paperwork for the physician (attn to [facility]) then a few more steps.  I then called patient's daughter [name] and informed her that the company will be calling either her or the patient to speak to them about the dexcom.   ?

## 2022-01-14 ENCOUNTER — Other Ambulatory Visit: Payer: Self-pay | Admitting: Internal Medicine

## 2022-01-17 DIAGNOSIS — E1142 Type 2 diabetes mellitus with diabetic polyneuropathy: Secondary | ICD-10-CM | POA: Diagnosis not present

## 2022-02-01 DIAGNOSIS — J439 Emphysema, unspecified: Secondary | ICD-10-CM | POA: Diagnosis not present

## 2022-02-03 ENCOUNTER — Telehealth: Payer: Self-pay

## 2022-02-08 ENCOUNTER — Other Ambulatory Visit: Payer: Self-pay | Admitting: Physician Assistant

## 2022-02-08 ENCOUNTER — Other Ambulatory Visit: Payer: Self-pay | Admitting: Internal Medicine

## 2022-02-08 DIAGNOSIS — M19011 Primary osteoarthritis, right shoulder: Secondary | ICD-10-CM

## 2022-02-08 DIAGNOSIS — R110 Nausea: Secondary | ICD-10-CM

## 2022-02-11 ENCOUNTER — Telehealth: Payer: Self-pay

## 2022-02-11 NOTE — Telephone Encounter (Signed)
Faxed dexcom paper work Forensic psychologist [phone] and phone [phone] ext [phone]

## 2022-02-22 ENCOUNTER — Other Ambulatory Visit: Payer: Self-pay | Admitting: Physician Assistant

## 2022-02-23 ENCOUNTER — Telehealth: Payer: Self-pay

## 2022-02-25 NOTE — Telephone Encounter (Signed)
error 

## 2022-03-04 DIAGNOSIS — J439 Emphysema, unspecified: Secondary | ICD-10-CM | POA: Diagnosis not present

## 2022-03-10 ENCOUNTER — Other Ambulatory Visit: Payer: Self-pay

## 2022-03-17 DIAGNOSIS — E113313 Type 2 diabetes mellitus with moderate nonproliferative diabetic retinopathy with macular edema, bilateral: Secondary | ICD-10-CM | POA: Diagnosis not present

## 2022-03-17 DIAGNOSIS — Z0100 Encounter for examination of eyes and vision without abnormal findings: Secondary | ICD-10-CM | POA: Diagnosis not present

## 2022-03-17 DIAGNOSIS — H524 Presbyopia: Secondary | ICD-10-CM | POA: Diagnosis not present

## 2022-03-27 ENCOUNTER — Other Ambulatory Visit: Payer: Self-pay | Admitting: Physician Assistant

## 2022-03-29 ENCOUNTER — Ambulatory Visit (INDEPENDENT_AMBULATORY_CARE_PROVIDER_SITE_OTHER): Payer: Medicare HMO | Admitting: Physician Assistant

## 2022-03-29 ENCOUNTER — Encounter: Payer: Self-pay | Admitting: Physician Assistant

## 2022-03-29 VITALS — BP 110/65 | HR 85 | Temp 98.2°F | Resp 16 | Ht 70.0 in | Wt 158.6 lb

## 2022-03-29 DIAGNOSIS — M19012 Primary osteoarthritis, left shoulder: Secondary | ICD-10-CM | POA: Diagnosis not present

## 2022-03-29 DIAGNOSIS — M19011 Primary osteoarthritis, right shoulder: Secondary | ICD-10-CM

## 2022-03-29 DIAGNOSIS — I10 Essential (primary) hypertension: Secondary | ICD-10-CM

## 2022-03-29 DIAGNOSIS — E1142 Type 2 diabetes mellitus with diabetic polyneuropathy: Secondary | ICD-10-CM

## 2022-03-29 DIAGNOSIS — R0902 Hypoxemia: Secondary | ICD-10-CM | POA: Diagnosis not present

## 2022-03-29 DIAGNOSIS — J449 Chronic obstructive pulmonary disease, unspecified: Secondary | ICD-10-CM | POA: Diagnosis not present

## 2022-03-29 LAB — POCT GLYCOSYLATED HEMOGLOBIN (HGB A1C): Hemoglobin A1C: 7 % — AB (ref 4.0–5.6)

## 2022-03-29 MED ORDER — GABAPENTIN 300 MG PO CAPS: ORAL_CAPSULE | ORAL | 2 refills | Status: DC

## 2022-03-29 MED ORDER — TRAMADOL HCL 50 MG PO TABS: 50.0000 mg | ORAL_TABLET | Freq: Two times a day (BID) | ORAL | 1 refills | Status: DC | PRN

## 2022-03-29 NOTE — Progress Notes (Signed)
[facility] [address] [place], [place] [address]  Internal MEDICINE  Office Visit Note  Patient Name: [name]  [id number]  [id number]  Date of Service: 04/06/2022  Chief Complaint  Patient presents with   Follow-up    Switching from [employer] to [employer] [facility] end of the month    Diabetes   Gastroesophageal Reflux   Hypertension   Hyperlipidemia   COPD    HPI Pt is here for routine follow up -Avg BG low 100s, occasional 140-150 -sees vascular eye specialist soon -Sees Dr. Ether Griffins the 24th -Cardiology Follow up in August and Vascular in October -prosthetic clinic again in august. Has been doing very well with his prosthetic and is walking with a cane now -taking gabapentin twice per day and needs refill of this and his tramadol -oxygen provider is switching and was sent letter stating this would be switched from [employer] to [employer] for them automatically, wears at night only. Daughter states she will contact [employer] to ensure his info is transferred   Current Medication: Outpatient Encounter Medications as of 03/29/2022  Medication Sig   acetaminophen (TYLENOL) 325 MG tablet Take 650 mg by mouth every 6 (six) hours as needed for moderate pain.   aspirin EC 81 MG tablet Take 81 mg by mouth daily.   Blood Glucose Monitoring Suppl (TRUE METRIX AIR GLUCOSE METER) w/Device KIT USE AS DIRECTED TO CHECK GLUCOSE   budesonide-formoterol (SYMBICORT) 80-4.5 MCG/ACT inhaler Inhale 2 puffs into the lungs 2 (two) times daily.   Calcium Carbonate-Vitamin D (CALCIUM PLUS VITAMIN D PO) Take 1 tablet by mouth daily.   clopidogrel (PLAVIX) 75 MG tablet TAKE 1 TABLET EVERY DAY   Continuous Blood Gluc Receiver (DEXCOM G6 RECEIVER) DEVI Use for continuous blood glucose monitoring DX E11.65   Continuous Blood Gluc Sensor (DEXCOM G6 SENSOR) MISC Use for Continuous blood glucose monitoring.  Replace every 10 days  DX E11.65   Continuous Blood Gluc Transmit (DEXCOM G6 TRANSMITTER) MISC Use as  directed for continuous glucose monitoring DX E11.65   cyclobenzaprine (FLEXERIL) 10 MG tablet TAKE 1 TABLET THREE TIMES DAILY AS NEEDED FOR MUSCLE SPASM(S)   diphenhydrAMINE (BENADRYL) 25 mg capsule Take 25 mg by mouth every 6 (six) hours as needed for allergies.   fosfomycin (MONUROL) 3 g PACK Take 3 g by mouth once.   glucose blood (TRUE METRIX BLOOD GLUCOSE TEST) test strip TEST BLOOD SUGAR TWICE DAILY AS DIRECTED   glyBURIDE (DIABETA) 5 MG tablet TAKE 1 TABLET TWICE DAILY WITH MEALS   JARDIANCE 25 MG TABS tablet TAKE 1 TABLET EVERY DAY   lovastatin (MEVACOR) 10 MG tablet TAKE 1 TABLET AT BEDTIME   meloxicam (MOBIC) 15 MG tablet Take 15 mg by mouth daily.   metoprolol succinate (TOPROL-XL) 25 MG 24 hr tablet Take 1 tablet by mouth daily.   OXYGEN Inhale into the lungs. 2 litre at night   pantoprazole (PROTONIX) 40 MG tablet TAKE 1 TABLET EVERY DAY   TRUEplus Lancets 30G MISC by Does not apply route. Use as directed twice a daily Dx E11.65   [DISCONTINUED] gabapentin (NEURONTIN) 300 MG capsule TAKE 1 CAPSULE THREE TIMES DAILY   [DISCONTINUED] traMADol (ULTRAM) 50 MG tablet TAKE 1 TABLET (50 MG TOTAL) BY MOUTH EVERY 12 (TWELVE) HOURS AS NEEDED.   gabapentin (NEURONTIN) 300 MG capsule TAKE 1 CAPSULE THREE TIMES DAILY   traMADol (ULTRAM) 50 MG tablet Take 1 tablet (50 mg total) by mouth every 12 (twelve) hours as needed.  No facility-administered encounter medications on file as of 03/29/2022.    Surgical History: Past Surgical History:  Procedure Laterality Date   AMPUTATION Left 04/23/2021   Procedure: AMPUTATION BELOW KNEE;  Surgeon: [name], MD;  Location: [facility];  Service: General;  Laterality: Left;   AMPUTATION TOE Left 02/25/2017   Procedure: AMPUTATION TOE-LEFT 2ND MPJ;  Surgeon: Samara Deist, DPM;  Location: [facility];  Service: Podiatry;  Laterality: Left;   AMPUTATION TOE Left 10/20/2018   Procedure: TOE MPJ T2 LEFT;  Surgeon: Samara Deist, DPM;  Location: [facility];   Service: Podiatry;  Laterality: Left;   AMPUTATION TOE Left 01/12/2019   Procedure: AMPUTATION LEFT 5TH TOE AND JOINT;  Surgeon: Samara Deist, DPM;  Location: [facility];  Service: Podiatry;  Laterality: Left;   BACK SURGERY  2008   BONE BIOPSY Left 11/21/2020   Procedure: BONE BIOPSY;  Surgeon: Samara Deist, DPM;  Location: [facility];  Service: Podiatry;  Laterality: Left;   CHOLECYSTECTOMY     COLONOSCOPY WITH PROPOFOL N/A 08/15/2017   Procedure: COLONOSCOPY WITH PROPOFOL;  Surgeon: Lollie Sails, MD;  Location: [facility];  Service: Endoscopy;  Laterality: N/A;   ENDARTERECTOMY Right 05/12/2017   Procedure: ENDARTERECTOMY CAROTID;  Surgeon: [name], MD;  Location: [facility];  Service: Vascular;  Laterality: Right;   ENDARTERECTOMY FEMORAL Left 12/01/2018   Procedure: ENDARTERECTOMY FEMORAL;  Surgeon: [name], MD;  Location: [facility];  Service: Vascular;  Laterality: Left;   ERCP     FOOT SURGERY Right    x 3   IRRIGATION AND DEBRIDEMENT FOOT Left 11/21/2020   Procedure: IRRIGATION AND DEBRIDEMENT FOOT--with placement of wound vac;  Surgeon: Samara Deist, DPM;  Location: [facility];  Service: Podiatry;  Laterality: Left;   LOWER EXTREMITY ANGIOGRAM Left 12/01/2018   Procedure: LOWER EXTREMITY ANGIOGRAM;  Surgeon: [name], MD;  Location: [facility];  Service: Vascular;  Laterality: Left;   LOWER EXTREMITY ANGIOGRAPHY Left 10/30/2018   Procedure: LOWER EXTREMITY ANGIOGRAPHY;  Surgeon: [name], MD;  Location: [facility];  Service: Cardiovascular;  Laterality: Left;   LOWER EXTREMITY ANGIOGRAPHY Left 11/30/2018   Procedure: LOWER EXTREMITY ANGIOGRAPHY;  Surgeon: [name], MD;  Location: [facility];  Service: Cardiovascular;  Laterality: Left;   LOWER EXTREMITY ANGIOGRAPHY Left 11/17/2020   Procedure: Lower Extremity Angiography;  Surgeon: [name], MD;  Location: [facility];  Service: Cardiovascular;  Laterality: Left;   LOWER EXTREMITY  ANGIOGRAPHY Left 04/22/2021   Procedure: Lower Extremity Angiography;  Surgeon: [name], MD;  Location: [facility];  Service: Cardiovascular;  Laterality: Left;   TEE WITHOUT CARDIOVERSION N/A 11/14/2020   Procedure: TRANSESOPHAGEAL ECHOCARDIOGRAM (TEE);  Surgeon: Corey Skains, MD;  Location: [facility];  Service: Cardiovascular;  Laterality: N/A;    Medical History: Past Medical History:  Diagnosis Date   Cholecystitis, chronic    Cholelithiasis    COPD (chronic obstructive pulmonary disease) ([place])    Diabetes mellitus without complication ([place])    Diabetic retinopathy ([place])    Dyspnea    Fatty liver 2008   Fracture of clavicle 1992   left    Fracture of ribs, multiple 1992   3 ribs   GERD (gastroesophageal reflux disease)    Hyperlipidemia    Hypertension    Paroxysmal SVT (supraventricular tachycardia) ([place])    Pelvis fracture ([place]) 1992   Peripheral vascular disease ([place])    Pleurisy  Pleurisy    Seasonal allergies     Family History: Family History  Problem Relation Age of Onset   Diabetes Sister     Social History   Socioeconomic History   Marital status: Single    Spouse name: Not on file   Number of children: Not on file   Years of education: Not on file   Highest education level: Not on file  Occupational History   Not on file  Tobacco Use   Smoking status: Former    Types: Cigarettes    Quit date: 02/24/1997    Years since quitting: 25.1   Smokeless tobacco: Never  Vaping Use   Vaping Use: Never used  Substance and Sexual Activity   Alcohol use: No   Drug use: No   Sexual activity: Not on file  Other Topics Concern   Not on file  Social History Narrative   Not on file   Social Determinants of Health   Financial Resource Strain: Low Risk  (02/17/2021)   Overall Financial Resource Strain (CARDIA)    Difficulty of Paying Living Expenses: Not very hard  Food Insecurity: No Food Insecurity (08/17/2021)   Hunger Vital Sign     Worried About Running Out of Food in the Last Year: Never true    Ran Out of Food in the Last Year: Never true  Transportation Needs: No Transportation Needs (08/17/2021)   PRAPARE - Hydrologist (Medical): No    Lack of Transportation (Non-Medical): No  Physical Activity: Not on file  Stress: Not on file  Social Connections: Not on file  Intimate Partner Violence: Not on file      Review of Systems  Constitutional:  Negative for chills, fatigue and unexpected weight change.  HENT:  Negative for congestion, rhinorrhea, sneezing and sore throat.   Eyes:  Negative for redness.  Respiratory:  Negative for cough, chest tightness and shortness of breath.   Cardiovascular:  Negative for chest pain and palpitations.  Gastrointestinal:  Negative for abdominal pain, constipation, diarrhea, nausea and vomiting.  Genitourinary:  Negative for dysuria and frequency.  Musculoskeletal:  Positive for gait problem. Negative for arthralgias, back pain, joint swelling and neck pain.  Skin:  Negative for rash.  Neurological:  Negative for tremors and numbness.  Hematological:  Negative for adenopathy. Does not bruise/bleed easily.  Psychiatric/Behavioral:  Negative for behavioral problems (Depression), sleep disturbance and suicidal ideas. The patient is not nervous/anxious.     Vital Signs: BP 110/65   Pulse 85   Temp 98.2 F (36.8 C)   Resp 16   Ht $R'5\' 10"'xF$  (1.778 m)   Wt 158 lb 9.6 oz (71.9 kg)   SpO2 99%   BMI 22.76 kg/m    Physical Exam Vitals and nursing note reviewed.  Constitutional:      General: He is not in acute distress.    Appearance: He is well-developed. He is not diaphoretic.  HENT:     Head: Normocephalic and atraumatic.     Mouth/Throat:     Pharynx: No oropharyngeal exudate.  Eyes:     Pupils: Pupils are equal, round, and reactive to light.  Neck:     Thyroid: No thyromegaly.     Vascular: No JVD.     Trachea: No tracheal deviation.   Cardiovascular:     Rate and Rhythm: Normal rate and regular rhythm.     Heart sounds: Normal heart sounds. No murmur heard.    No friction  rub. No gallop.  Pulmonary:     Effort: Pulmonary effort is normal. No respiratory distress.     Breath sounds: No wheezing or rales.  Chest:     Chest wall: No tenderness.  Abdominal:     General: Bowel sounds are normal.     Palpations: Abdomen is soft.  Musculoskeletal:        General: Normal range of motion.     Cervical back: Normal range of motion and neck supple.     Comments: LLE BKA, wearing prosthetic, walking with cane  Lymphadenopathy:     Cervical: No cervical adenopathy.  Skin:    General: Skin is warm and dry.  Neurological:     Mental Status: He is alert and oriented to person, place, and time.     Cranial Nerves: No cranial nerve deficit.     Gait: Gait abnormal.  Psychiatric:        Behavior: Behavior normal.        Thought Content: Thought content normal.        Judgment: Judgment normal.        Assessment/Plan: 1. Type 2 diabetes mellitus with diabetic polyneuropathy, without long-term current use of insulin (HCC) - POCT HgB A1C is 7.0 which is increased from 6.7 last visit. Will continue current medications and work on diet and exercise. - Microalbumin, urine  2. Essential hypertension Stable, continue current medications  3. Primary osteoarthritis of shoulders, bilateral May continue tramadol as needed - traMADol (ULTRAM) 50 MG tablet; Take 1 tablet (50 mg total) by mouth every 12 (twelve) hours as needed.  Dispense: 45 tablet; Refill: 1  4. COPD with hypoxia ([place]) Continue inhalers and oxygen as prescribed   General Counseling: [place] verbalizes understanding of the findings of todays visit and agrees with plan of treatment. I have discussed any further diagnostic evaluation that may be needed or ordered today. We also reviewed his medications today. he has been encouraged to call the office with any  questions or concerns that should arise related to todays visit.    Orders Placed This Encounter  Procedures   Microalbumin, urine   POCT HgB A1C    Meds ordered this encounter  Medications   gabapentin (NEURONTIN) 300 MG capsule    Sig: TAKE 1 CAPSULE THREE TIMES DAILY    Dispense:  90 capsule    Refill:  2   traMADol (ULTRAM) 50 MG tablet    Sig: Take 1 tablet (50 mg total) by mouth every 12 (twelve) hours as needed.    Dispense:  45 tablet    Refill:  1    This patient was seen by Drema Dallas, PA-C in collaboration with Dr. Clayborn Bigness as a part of collaborative care agreement.   Total time spent:30 Minutes Time spent includes review of chart, medications, test results, and follow up plan with the patient.      Dr [name] Internal medicine

## 2022-03-31 LAB — MICROALBUMIN, URINE: Microalbumin, Urine: <3 ug/mL

## 2022-04-05 ENCOUNTER — Telehealth: Payer: Self-pay

## 2022-04-05 ENCOUNTER — Other Ambulatory Visit: Payer: Self-pay

## 2022-04-05 MED ORDER — FLUCONAZOLE 150 MG PO TABS: ORAL_TABLET | ORAL | 0 refills | Status: DC

## 2022-04-05 NOTE — Telephone Encounter (Signed)
Pt daughter called that pt having difficulty swallow and sore throat spoke with pt he said more like thrush and sore throat ,no  coughing,no fever or no body ache  as per lauren send diflucan and also advised if he is not feeling better we can make virtual appt

## 2022-04-19 DIAGNOSIS — H26491 Other secondary cataract, right eye: Secondary | ICD-10-CM | POA: Diagnosis not present

## 2022-04-19 DIAGNOSIS — E113313 Type 2 diabetes mellitus with moderate nonproliferative diabetic retinopathy with macular edema, bilateral: Secondary | ICD-10-CM | POA: Diagnosis not present

## 2022-04-19 DIAGNOSIS — B351 Tinea unguium: Secondary | ICD-10-CM | POA: Diagnosis not present

## 2022-04-19 DIAGNOSIS — E1142 Type 2 diabetes mellitus with diabetic polyneuropathy: Secondary | ICD-10-CM | POA: Diagnosis not present

## 2022-04-19 DIAGNOSIS — Z89512 Acquired absence of left leg below knee: Secondary | ICD-10-CM | POA: Diagnosis not present

## 2022-04-19 DIAGNOSIS — Z961 Presence of intraocular lens: Secondary | ICD-10-CM | POA: Diagnosis not present

## 2022-04-21 ENCOUNTER — Other Ambulatory Visit: Payer: Self-pay | Admitting: Physician Assistant

## 2022-04-21 ENCOUNTER — Telehealth: Payer: Self-pay

## 2022-04-21 DIAGNOSIS — J449 Chronic obstructive pulmonary disease, unspecified: Secondary | ICD-10-CM

## 2022-04-21 NOTE — Telephone Encounter (Signed)
Sent community message to [employer] for pt to switch from [place] to [employer] and advised orders in Epic and to contact Daughter [place]. 04/21/22 @ 3:08 pm

## 2022-04-21 NOTE — Telephone Encounter (Signed)
Pt daughter advised that desha already send message to [employer] for oxygen order

## 2022-05-21 ENCOUNTER — Other Ambulatory Visit: Payer: Self-pay | Admitting: Physician Assistant

## 2022-05-21 DIAGNOSIS — G8929 Other chronic pain: Secondary | ICD-10-CM

## 2022-06-07 ENCOUNTER — Telehealth: Payer: Self-pay

## 2022-06-07 NOTE — Telephone Encounter (Signed)
Pt's daughter called and advised that pt was in donut hole for Jardiance and asked if we can get samples or switch to another medication.  I advised daughter that we can do a pt assistance application and see if he can qualify.  I printed application off and informed daughter and placed envelope at front desk for them to pick up.  Advised that they bring completed application along with any required information back to office and will fax to company.

## 2022-06-21 ENCOUNTER — Telehealth: Payer: Self-pay | Admitting: Physician Assistant

## 2022-06-21 NOTE — Telephone Encounter (Signed)
Left vm to confirm 06/28/22 appointment-Toni

## 2022-06-22 ENCOUNTER — Telehealth: Payer: Self-pay | Admitting: Physician Assistant

## 2022-06-23 ENCOUNTER — Other Ambulatory Visit: Payer: Self-pay | Admitting: Physician Assistant

## 2022-06-23 DIAGNOSIS — J449 Chronic obstructive pulmonary disease, unspecified: Secondary | ICD-10-CM

## 2022-06-24 ENCOUNTER — Telehealth: Payer: Self-pay

## 2022-06-24 NOTE — Telephone Encounter (Signed)
Sent another message to [employer] to see if they have got pt as a customer yet and have him set up.

## 2022-06-28 ENCOUNTER — Ambulatory Visit (INDEPENDENT_AMBULATORY_CARE_PROVIDER_SITE_OTHER): Payer: Medicare HMO | Admitting: Physician Assistant

## 2022-06-28 ENCOUNTER — Encounter: Payer: Self-pay | Admitting: Physician Assistant

## 2022-06-28 VITALS — BP 132/72 | HR 82 | Temp 98.2°F | Resp 16 | Ht 70.0 in | Wt 152.4 lb

## 2022-06-28 DIAGNOSIS — R5383 Other fatigue: Secondary | ICD-10-CM

## 2022-06-28 DIAGNOSIS — Z23 Encounter for immunization: Secondary | ICD-10-CM | POA: Diagnosis not present

## 2022-06-28 DIAGNOSIS — E785 Hyperlipidemia, unspecified: Secondary | ICD-10-CM | POA: Diagnosis not present

## 2022-06-28 DIAGNOSIS — E782 Mixed hyperlipidemia: Secondary | ICD-10-CM

## 2022-06-28 DIAGNOSIS — E1142 Type 2 diabetes mellitus with diabetic polyneuropathy: Secondary | ICD-10-CM | POA: Diagnosis not present

## 2022-06-28 DIAGNOSIS — R300 Dysuria: Secondary | ICD-10-CM

## 2022-06-28 DIAGNOSIS — Z0001 Encounter for general adult medical examination with abnormal findings: Secondary | ICD-10-CM

## 2022-06-28 DIAGNOSIS — R946 Abnormal results of thyroid function studies: Secondary | ICD-10-CM

## 2022-06-28 DIAGNOSIS — J449 Chronic obstructive pulmonary disease, unspecified: Secondary | ICD-10-CM

## 2022-06-28 DIAGNOSIS — I739 Peripheral vascular disease, unspecified: Secondary | ICD-10-CM | POA: Diagnosis not present

## 2022-06-28 DIAGNOSIS — I428 Other cardiomyopathies: Secondary | ICD-10-CM | POA: Diagnosis not present

## 2022-06-28 DIAGNOSIS — I10 Essential (primary) hypertension: Secondary | ICD-10-CM

## 2022-06-28 DIAGNOSIS — I471 Supraventricular tachycardia, unspecified: Secondary | ICD-10-CM | POA: Diagnosis not present

## 2022-06-28 LAB — POCT GLYCOSYLATED HEMOGLOBIN (HGB A1C): Hemoglobin A1C: 6.5 % — AB (ref 4.0–5.6)

## 2022-06-28 NOTE — Progress Notes (Signed)
[facility] [place], [place] [address]  Internal MEDICINE  Office Visit Note  Patient Name: [name]  [id number]  [id number]  Date of Service: 07/07/2022  Chief Complaint  Patient presents with   Medicare Wellness   Diabetes   Gastroesophageal Reflux   Hypertension   Hyperlipidemia    HPI Pt is here for routine follow up -Fasting 110-160 with occasional higher 200 reading. He is in the donut hole for jardiance and has been too expensive. He is in the process of applying for patient assistance and was given some samples today in office. -BP 117-130/60-70s at home -Oxygen 97-98% and HR 56-80 at home. Denies dizziness. Still working to get oxygen established with new DME company, [employer] -Left sided neck pain for awhile now. Has been using icyhot.  -Dr. Vickki Muff for podiatry who does his foot exam -Eye exam in July and will go back again in Nov--following macular degeneration -Dr. Lucky Cowboy for vascular follow up in [place] -Shingles vaccine second dose at [facility] and will notify office of date  Current Medication: Outpatient Encounter Medications as of 06/28/2022  Medication Sig   acetaminophen (TYLENOL) 325 MG tablet Take 650 mg by mouth every 6 (six) hours as needed for moderate pain.   aspirin EC 81 MG tablet Take 81 mg by mouth daily.   Blood Glucose Monitoring Suppl (TRUE METRIX AIR GLUCOSE METER) w/Device KIT USE AS DIRECTED TO CHECK GLUCOSE   budesonide-formoterol (SYMBICORT) 80-4.5 MCG/ACT inhaler Inhale 2 puffs into the lungs 2 (two) times daily.   Calcium Carbonate-Vitamin D (CALCIUM PLUS VITAMIN D PO) Take 1 tablet by mouth daily.   clopidogrel (PLAVIX) 75 MG tablet TAKE 1 TABLET EVERY DAY   Continuous Blood Gluc Receiver (DEXCOM G6 RECEIVER) DEVI Use for continuous blood glucose monitoring DX E11.65   Continuous Blood Gluc Sensor (DEXCOM G6 SENSOR) MISC Use for Continuous blood glucose monitoring.  Replace every 10 days  DX E11.65   Continuous  Blood Gluc Transmit (DEXCOM G6 TRANSMITTER) MISC Use as directed for continuous glucose monitoring DX E11.65   cyclobenzaprine (FLEXERIL) 10 MG tablet TAKE 1 TABLET THREE TIMES DAILY AS NEEDED FOR MUSCLE SPASM(S)   diphenhydrAMINE (BENADRYL) 25 mg capsule Take 25 mg by mouth every 6 (six) hours as needed for allergies.   fosfomycin (MONUROL) 3 g PACK Take 3 g by mouth once.   gabapentin (NEURONTIN) 300 MG capsule TAKE 1 CAPSULE THREE TIMES DAILY   glyBURIDE (DIABETA) 5 MG tablet TAKE 1 TABLET TWICE DAILY WITH MEALS   JARDIANCE 25 MG TABS tablet TAKE 1 TABLET EVERY DAY   lovastatin (MEVACOR) 10 MG tablet TAKE 1 TABLET AT BEDTIME   meloxicam (MOBIC) 15 MG tablet Take 15 mg by mouth daily.   metoprolol succinate (TOPROL-XL) 25 MG 24 hr tablet Take 1 tablet by mouth daily.   OXYGEN Inhale into the lungs. 2 litre at night   pantoprazole (PROTONIX) 40 MG tablet TAKE 1 TABLET EVERY DAY   traMADol (ULTRAM) 50 MG tablet Take 1 tablet (50 mg total) by mouth every 12 (twelve) hours as needed.   TRUEplus Lancets 30G MISC by Does not apply route. Use as directed twice a daily Dx E11.65   [DISCONTINUED] fluconazole (DIFLUCAN) 150 MG tablet Take 1 tab po  once take po once and may repeat in 2 days if symptoms persists   [DISCONTINUED] glucose blood (TRUE METRIX BLOOD GLUCOSE TEST) test strip TEST BLOOD SUGAR TWICE DAILY AS DIRECTED   No facility-administered encounter  medications on file as of 06/28/2022.    Surgical History: Past Surgical History:  Procedure Laterality Date   AMPUTATION Left 04/23/2021   Procedure: AMPUTATION BELOW KNEE;  Surgeon: [name], MD;  Location: [facility];  Service: General;  Laterality: Left;   AMPUTATION TOE Left 02/25/2017   Procedure: AMPUTATION TOE-LEFT 2ND MPJ;  Surgeon: Samara Deist, DPM;  Location: [facility];  Service: Podiatry;  Laterality: Left;   AMPUTATION TOE Left 10/20/2018   Procedure: TOE MPJ T2 LEFT;  Surgeon: Samara Deist, DPM;  Location: [facility];   Service: Podiatry;  Laterality: Left;   AMPUTATION TOE Left 01/12/2019   Procedure: AMPUTATION LEFT 5TH TOE AND JOINT;  Surgeon: Samara Deist, DPM;  Location: [facility];  Service: Podiatry;  Laterality: Left;   BACK SURGERY  2008   BONE BIOPSY Left 11/21/2020   Procedure: BONE BIOPSY;  Surgeon: Samara Deist, DPM;  Location: [facility];  Service: Podiatry;  Laterality: Left;   CHOLECYSTECTOMY     COLONOSCOPY WITH PROPOFOL N/A 08/15/2017   Procedure: COLONOSCOPY WITH PROPOFOL;  Surgeon: Lollie Sails, MD;  Location: [facility]OSCOPY;  Service: Endoscopy;  Laterality: N/A;   ENDARTERECTOMY Right 05/12/2017   Procedure: ENDARTERECTOMY CAROTID;  Surgeon: [name], MD;  Location: [facility];  Service: Vascular;  Laterality: Right;   ENDARTERECTOMY FEMORAL Left 12/01/2018   Procedure: ENDARTERECTOMY FEMORAL;  Surgeon: [name], MD;  Location: [facility];  Service: Vascular;  Laterality: Left;   ERCP     FOOT SURGERY Right    x 3   IRRIGATION AND DEBRIDEMENT FOOT Left 11/21/2020   Procedure: IRRIGATION AND DEBRIDEMENT FOOT--with placement of wound vac;  Surgeon: Samara Deist, DPM;  Location: [facility];  Service: Podiatry;  Laterality: Left;   LOWER EXTREMITY ANGIOGRAM Left 12/01/2018   Procedure: LOWER EXTREMITY ANGIOGRAM;  Surgeon: [name], MD;  Location: [facility];  Service: Vascular;  Laterality: Left;   LOWER EXTREMITY ANGIOGRAPHY Left 10/30/2018   Procedure: LOWER EXTREMITY ANGIOGRAPHY;  Surgeon: [name], MD;  Location: [place] [facility];  Service: Cardiovascular;  Laterality: Left;   LOWER EXTREMITY ANGIOGRAPHY Left 11/30/2018   Procedure: LOWER EXTREMITY ANGIOGRAPHY;  Surgeon: [name], MD;  Location: [place] [facility];  Service: Cardiovascular;  Laterality: Left;   LOWER EXTREMITY ANGIOGRAPHY Left 11/17/2020   Procedure: Lower Extremity Angiography;  Surgeon: [name], MD;  Location: [place] [facility];  Service: Cardiovascular;  Laterality: Left;   LOWER EXTREMITY  ANGIOGRAPHY Left 04/22/2021   Procedure: Lower Extremity Angiography;  Surgeon: [name], MD;  Location: [place] [facility];  Service: Cardiovascular;  Laterality: Left;   TEE WITHOUT CARDIOVERSION N/A 11/14/2020   Procedure: TRANSESOPHAGEAL ECHOCARDIOGRAM (TEE);  Surgeon: Corey Skains, MD;  Location: [facility];  Service: Cardiovascular;  Laterality: N/A;    Medical History: Past Medical History:  Diagnosis Date   Cholecystitis, chronic    Cholelithiasis    COPD (chronic obstructive pulmonary disease) ([place])    Diabetes mellitus without complication ([place])    Diabetic retinopathy ([place])    Dyspnea    Fatty liver 2008   Fracture of clavicle 1992   left    Fracture of ribs, multiple 1992   3 ribs   GERD (gastroesophageal reflux disease)    Hyperlipidemia    Hypertension    Paroxysmal SVT (supraventricular tachycardia)    Pelvis fracture ([place]) 1992   Peripheral vascular disease ([place])    Pleurisy    Pleurisy  Seasonal allergies     Family History: Family History  Problem Relation Age of Onset   Diabetes Sister     Social History   Socioeconomic History   Marital status: Single    Spouse name: Not on file   Number of children: Not on file   Years of education: Not on file   Highest education level: Not on file  Occupational History   Not on file  Tobacco Use   Smoking status: Former    Types: Cigarettes    Quit date: 02/24/1997    Years since quitting: 25.3   Smokeless tobacco: Never  Vaping Use   Vaping Use: Never used  Substance and Sexual Activity   Alcohol use: No   Drug use: No   Sexual activity: Not on file  Other Topics Concern   Not on file  Social History Narrative   Not on file   Social Determinants of Health   Financial Resource Strain: Low Risk  (02/17/2021)   Overall Financial Resource Strain (CARDIA)    Difficulty of Paying Living Expenses: Not very hard  Food Insecurity: No Food Insecurity (08/17/2021)   Hunger Vital Sign     Worried About Running Out of Food in the Last Year: Never true    Ran Out of Food in the Last Year: Never true  Transportation Needs: No Transportation Needs (08/17/2021)   PRAPARE - Hydrologist (Medical): No    Lack of Transportation (Non-Medical): No  Physical Activity: Not on file  Stress: Not on file  Social Connections: Not on file  Intimate Partner Violence: Not on file      Review of Systems  Constitutional:  Negative for chills, fatigue and unexpected weight change.  HENT:  Negative for congestion, rhinorrhea, sneezing and sore throat.   Eyes:  Negative for redness.  Respiratory:  Negative for cough, chest tightness and shortness of breath.   Cardiovascular:  Negative for chest pain and palpitations.  Gastrointestinal:  Negative for abdominal pain, constipation, diarrhea, nausea and vomiting.  Genitourinary:  Negative for dysuria and frequency.  Musculoskeletal:  Positive for gait problem. Negative for arthralgias, back pain, joint swelling and neck pain.  Skin:  Negative for rash.  Neurological:  Negative for tremors and numbness.  Hematological:  Negative for adenopathy. Does not bruise/bleed easily.  Psychiatric/Behavioral:  Negative for behavioral problems (Depression), sleep disturbance and suicidal ideas. The patient is not nervous/anxious.     Vital Signs: BP 132/72 Comment: 141/74  Pulse 82   Temp 98.2 F (36.8 C)   Resp 16   Ht _0  (1.778 m)   Wt 152 lb 6.4 oz (69.1 kg)   SpO2 98%   BMI 21.87 kg/m    Physical Exam Vitals and nursing note reviewed.  Constitutional:      General: He is not in acute distress.    Appearance: He is well-developed. He is not diaphoretic.  HENT:     Head: Normocephalic and atraumatic.     Mouth/Throat:     Pharynx: No oropharyngeal exudate.  Eyes:     Pupils: Pupils are equal, round, and reactive to light.  Neck:     Thyroid: No thyromegaly.     Vascular: No JVD.     Trachea: No  tracheal deviation.  Cardiovascular:     Rate and Rhythm: Normal rate and regular rhythm.     Heart sounds: Normal heart sounds. No murmur heard.    No friction rub. No gallop.  Pulmonary:     Effort: Pulmonary effort is normal. No respiratory distress.     Breath sounds: No wheezing or rales.  Chest:     Chest wall: No tenderness.  Abdominal:     General: Bowel sounds are normal.     Palpations: Abdomen is soft.  Musculoskeletal:        General: Normal range of motion.     Cervical back: Normal range of motion and neck supple.     Comments: LLE BKA, wearing prosthetic, walking with cane  Lymphadenopathy:     Cervical: No cervical adenopathy.  Skin:    General: Skin is warm and dry.  Neurological:     Mental Status: He is alert and oriented to person, place, and time.     Cranial Nerves: No cranial nerve deficit.     Gait: Gait abnormal.  Psychiatric:        Behavior: Behavior normal.        Thought Content: Thought content normal.        Judgment: Judgment normal.        Assessment/Plan: 1. Encounter for general adult medical examination with abnormal findings Cpe performed, due for routine fasting labs, UTD on PHM - CBC w/Diff/Platelet - Comprehensive metabolic panel - Lipid Panel With LDL/HDL Ratio - TSH + free T4  2. Type 2 diabetes mellitus with diabetic polyneuropathy, without long-term current use of insulin (HCC) - POCT HgB A1C is 6.5 which is improved from 7.0 last check. Continue current medications and working on diet and exercise. - Urine Microalbumin w/creat. ratio  3. Essential hypertension Stable, continue current medication  4. COPD with hypoxia ([place]) Continue inhalers and oxygen as prescribed  5. Mixed hyperlipidemia Continue lovastatin and will update labs - Lipid Panel With LDL/HDL Ratio  6. Abnormal thyroid exam - TSH + free T4  7. Other fatigue - CBC w/Diff/Platelet - Comprehensive metabolic panel - Lipid Panel With LDL/HDL Ratio -  TSH + free T4  8. Flu vaccine need - Flu Vaccine MDCK QUAD PF  9. Dysuria - UA/M w/rflx Culture, Routine   General Counseling: Raquel verbalizes understanding of the findings of todays visit and agrees with plan of treatment. I have discussed any further diagnostic evaluation that may be needed or ordered today. We also reviewed his medications today. he has been encouraged to call the office with any questions or concerns that should arise related to todays visit.    Orders Placed This Encounter  Procedures   Microscopic Examination   Flu Vaccine MDCK QUAD PF   UA/M w/rflx Culture, Routine   CBC w/Diff/Platelet   Comprehensive metabolic panel   Lipid Panel With LDL/HDL Ratio   TSH + free T4   Urine Microalbumin w/creat. ratio   POCT HgB A1C    No orders of the defined types were placed in this encounter.   This patient was seen by Drema Dallas, PA-C in collaboration with Dr. Clayborn Bigness as a part of collaborative care agreement.   Total time spent:35 Minutes Time spent includes review of chart, medications, test results, and follow up plan with the patient.      Dr [name] Internal medicine

## 2022-06-29 DIAGNOSIS — E782 Mixed hyperlipidemia: Secondary | ICD-10-CM | POA: Diagnosis not present

## 2022-06-29 DIAGNOSIS — R5383 Other fatigue: Secondary | ICD-10-CM | POA: Diagnosis not present

## 2022-06-29 DIAGNOSIS — R946 Abnormal results of thyroid function studies: Secondary | ICD-10-CM | POA: Diagnosis not present

## 2022-06-29 DIAGNOSIS — Z0001 Encounter for general adult medical examination with abnormal findings: Secondary | ICD-10-CM | POA: Diagnosis not present

## 2022-06-29 LAB — MICROSCOPIC EXAMINATION
Bacteria, UA: NONE SEEN
Casts: NONE SEEN /lpf
Epithelial Cells (non renal): NONE SEEN /hpf (ref 0–10)

## 2022-06-29 LAB — UA/M W/RFLX CULTURE, ROUTINE
Bilirubin, UA: NEGATIVE
Ketones, UA: NEGATIVE
Leukocytes,UA: NEGATIVE
Nitrite, UA: NEGATIVE
Protein,UA: NEGATIVE
RBC, UA: NEGATIVE
Specific Gravity, UA: >=1.03 — AB (ref 1.005–1.030)
Urobilinogen, Ur: 1 mg/dL (ref 0.2–1.0)
pH, UA: 6 (ref 5.0–7.5)

## 2022-06-29 LAB — MICROALBUMIN / CREATININE URINE RATIO
Creatinine, Urine: 68.4 mg/dL
Microalb/Creat Ratio: 8 mg/g{creat} (ref 0–29)
Microalbumin, Urine: 5.6 ug/mL

## 2022-06-29 NOTE — Progress Notes (Signed)
Lmom to call us back 

## 2022-06-30 ENCOUNTER — Other Ambulatory Visit: Payer: Self-pay | Admitting: Internal Medicine

## 2022-06-30 LAB — COMPREHENSIVE METABOLIC PANEL
ALT: 13 IU/L (ref 0–44)
AST: 15 IU/L (ref 0–40)
Albumin/Globulin Ratio: 2.1 (ref 1.2–2.2)
Albumin: 4.8 g/dL (ref 3.9–4.9)
Alkaline Phosphatase: 67 IU/L (ref 44–121)
BUN/Creatinine Ratio: 30 — ABNORMAL HIGH (ref 10–24)
BUN: 33 mg/dL — ABNORMAL HIGH (ref 8–27)
Bilirubin Total: 1.3 mg/dL — ABNORMAL HIGH (ref 0.0–1.2)
CO2: 27 mmol/L (ref 20–29)
Calcium: 9.7 mg/dL (ref 8.6–10.2)
Chloride: 96 mmol/L (ref 96–106)
Creatinine, Ser: 1.1 mg/dL (ref 0.76–1.27)
Globulin, Total: 2.3 g/dL (ref 1.5–4.5)
Glucose: 111 mg/dL — ABNORMAL HIGH (ref 70–99)
Potassium: 4.4 mmol/L (ref 3.5–5.2)
Sodium: 139 mmol/L (ref 134–144)
Total Protein: 7.1 g/dL (ref 6.0–8.5)
eGFR: 73 mL/min/{1.73_m2} (ref >59)

## 2022-06-30 LAB — TSH+FREE T4
Free T4: 1.26 ng/dL (ref 0.82–1.77)
TSH: 3.67 u[IU]/mL (ref 0.450–4.500)

## 2022-06-30 LAB — CBC WITH DIFFERENTIAL/PLATELET
Basophils Absolute: 0 10*3/uL (ref 0.0–0.2)
Basos: 1 %
EOS (ABSOLUTE): 0.2 10*3/uL (ref 0.0–0.4)
Eos: 4 %
Hematocrit: 47.4 % (ref 37.5–51.0)
Hemoglobin: 16 g/dL (ref 13.0–17.7)
Immature Grans (Abs): 0 10*3/uL (ref 0.0–0.1)
Immature Granulocytes: 0 %
Lymphocytes Absolute: 1.1 10*3/uL (ref 0.7–3.1)
Lymphs: 25 %
MCH: 29.5 pg (ref 26.6–33.0)
MCHC: 33.8 g/dL (ref 31.5–35.7)
MCV: 87 fL (ref 79–97)
Monocytes Absolute: 0.4 10*3/uL (ref 0.1–0.9)
Monocytes: 8 %
Neutrophils Absolute: 2.7 10*3/uL (ref 1.4–7.0)
Neutrophils: 62 %
Platelets: 131 10*3/uL — ABNORMAL LOW (ref 150–450)
RBC: 5.43 x10E6/uL (ref 4.14–5.80)
RDW: 13.2 % (ref 11.6–15.4)
WBC: 4.4 10*3/uL (ref 3.4–10.8)

## 2022-06-30 LAB — LIPID PANEL WITH LDL/HDL RATIO
Cholesterol, Total: 107 mg/dL (ref 100–199)
HDL: 27 mg/dL — ABNORMAL LOW (ref >39)
LDL Chol Calc (NIH): 55 mg/dL (ref 0–99)
LDL/HDL Ratio: 2 ratio (ref 0.0–3.6)
Triglycerides: 139 mg/dL (ref 0–149)
VLDL Cholesterol Cal: 25 mg/dL (ref 5–40)

## 2022-06-30 NOTE — Progress Notes (Signed)
Send mychart message

## 2022-07-01 ENCOUNTER — Other Ambulatory Visit: Payer: Self-pay

## 2022-07-01 ENCOUNTER — Telehealth: Payer: Self-pay

## 2022-07-01 MED ORDER — FLUCONAZOLE 150 MG PO TABS: ORAL_TABLET | ORAL | 0 refills | Status: DC

## 2022-07-01 NOTE — Telephone Encounter (Addendum)
Pt urine  result notified by desha and send diflucan to phar/also spoke with pt daughter that urine showed yeast infection we send diflucan

## 2022-07-01 NOTE — Telephone Encounter (Signed)
-----   Message from Mylinda Latina, PA-C sent at 06/29/2022  1:44 PM EDT ----- Please let him know that his urine did show some yeast and send a tab of diflucan if he doesn't have anymore

## 2022-07-07 ENCOUNTER — Other Ambulatory Visit (INDEPENDENT_AMBULATORY_CARE_PROVIDER_SITE_OTHER): Payer: Self-pay | Admitting: Nurse Practitioner

## 2022-07-07 DIAGNOSIS — I739 Peripheral vascular disease, unspecified: Secondary | ICD-10-CM

## 2022-07-07 DIAGNOSIS — I6523 Occlusion and stenosis of bilateral carotid arteries: Secondary | ICD-10-CM

## 2022-07-07 NOTE — Progress Notes (Signed)
Lmom to call us back 

## 2022-07-08 ENCOUNTER — Telehealth: Payer: Self-pay

## 2022-07-08 NOTE — Telephone Encounter (Signed)
-----   Message from Mylinda Latina, PA-C sent at 07/06/2022 10:38 AM EDT ----- Please let him know that overall his labs look ok. His hemoglobin is greatly improved, but his platelets are a little low

## 2022-07-08 NOTE — Telephone Encounter (Signed)
Left message for patient regarding normal lab results. 

## 2022-07-12 ENCOUNTER — Other Ambulatory Visit: Payer: Self-pay | Admitting: Physician Assistant

## 2022-07-13 ENCOUNTER — Ambulatory Visit (INDEPENDENT_AMBULATORY_CARE_PROVIDER_SITE_OTHER): Payer: Medicare HMO

## 2022-07-13 ENCOUNTER — Encounter (INDEPENDENT_AMBULATORY_CARE_PROVIDER_SITE_OTHER): Payer: Self-pay

## 2022-07-13 ENCOUNTER — Ambulatory Visit (INDEPENDENT_AMBULATORY_CARE_PROVIDER_SITE_OTHER): Payer: Medicare HMO | Admitting: Vascular Surgery

## 2022-07-13 DIAGNOSIS — I739 Peripheral vascular disease, unspecified: Secondary | ICD-10-CM

## 2022-07-13 DIAGNOSIS — Z9889 Other specified postprocedural states: Secondary | ICD-10-CM | POA: Diagnosis not present

## 2022-07-13 DIAGNOSIS — I6523 Occlusion and stenosis of bilateral carotid arteries: Secondary | ICD-10-CM

## 2022-07-22 NOTE — Telephone Encounter (Signed)
Error

## 2022-07-26 ENCOUNTER — Encounter (INDEPENDENT_AMBULATORY_CARE_PROVIDER_SITE_OTHER): Payer: Self-pay

## 2022-08-09 ENCOUNTER — Other Ambulatory Visit: Payer: Self-pay | Admitting: Physician Assistant

## 2022-08-09 DIAGNOSIS — M19012 Primary osteoarthritis, left shoulder: Secondary | ICD-10-CM

## 2022-08-16 ENCOUNTER — Other Ambulatory Visit: Payer: Self-pay | Admitting: Internal Medicine

## 2022-08-16 DIAGNOSIS — E1165 Type 2 diabetes mellitus with hyperglycemia: Secondary | ICD-10-CM

## 2022-09-14 DIAGNOSIS — E113313 Type 2 diabetes mellitus with moderate nonproliferative diabetic retinopathy with macular edema, bilateral: Secondary | ICD-10-CM | POA: Diagnosis not present

## 2022-09-16 ENCOUNTER — Telehealth: Payer: Self-pay | Admitting: Physician Assistant

## 2022-09-16 NOTE — Telephone Encounter (Signed)
o2 order signed. Faxed back o [employer]; [phone]

## 2022-09-24 ENCOUNTER — Telehealth: Payer: Self-pay

## 2022-09-24 NOTE — Telephone Encounter (Signed)
Faxed patient's AZ&ME form for Symbicort to [phone].

## 2022-09-26 ENCOUNTER — Other Ambulatory Visit: Payer: Self-pay | Admitting: Physician Assistant

## 2022-09-30 ENCOUNTER — Encounter: Payer: Self-pay | Admitting: Physician Assistant

## 2022-09-30 ENCOUNTER — Ambulatory Visit (INDEPENDENT_AMBULATORY_CARE_PROVIDER_SITE_OTHER): Payer: Medicare HMO | Admitting: Physician Assistant

## 2022-09-30 VITALS — BP 128/76 | HR 63 | Temp 98.2°F | Resp 16 | Ht 70.0 in | Wt 157.0 lb

## 2022-09-30 DIAGNOSIS — M19012 Primary osteoarthritis, left shoulder: Secondary | ICD-10-CM

## 2022-09-30 DIAGNOSIS — I10 Essential (primary) hypertension: Secondary | ICD-10-CM

## 2022-09-30 DIAGNOSIS — M19011 Primary osteoarthritis, right shoulder: Secondary | ICD-10-CM

## 2022-09-30 DIAGNOSIS — E1142 Type 2 diabetes mellitus with diabetic polyneuropathy: Secondary | ICD-10-CM

## 2022-09-30 LAB — POCT GLYCOSYLATED HEMOGLOBIN (HGB A1C): Hemoglobin A1C: 6.4 % — AB (ref 4.0–5.6)

## 2022-09-30 MED ORDER — TRAMADOL HCL 50 MG PO TABS: ORAL_TABLET | ORAL | 1 refills | Status: DC

## 2022-09-30 NOTE — Progress Notes (Signed)
[facility] [place], [place] [address]  Internal MEDICINE  Office Visit Note  Patient Name: [name]  [id number]  [id number]  Date of Service: 09/30/2022  Chief Complaint  Patient presents with   Follow-up   Diabetes   Gastroesophageal Reflux   Hypertension   Hyperlipidemia   Quality Metric Gaps    TDAP    HPI Pt is here for routine follow up -Has not taken jardiance in awhile due to cost from being in the donut hole and sugars have still been good -Will go ahead and hold on this due to costs and sugars controlled on glyburide currently, may restart if rising again -Does request tramadol refill be sent now so he doesn't have to call for it later this month -Goes to cardiology in march/April -Breathing has been good, monitors O2 at home and wears at night only.  -Doing well walking with prosthetic -he states he is ready for warmer weather to be able to go fishing again  Current Medication: Outpatient Encounter Medications as of 09/30/2022  Medication Sig   acetaminophen (TYLENOL) 325 MG tablet Take 650 mg by mouth every 6 (six) hours as needed for moderate pain.   aspirin EC 81 MG tablet Take 81 mg by mouth daily.   Blood Glucose Monitoring Suppl (TRUE METRIX AIR GLUCOSE METER) w/Device KIT USE AS DIRECTED TO CHECK GLUCOSE   budesonide-formoterol (SYMBICORT) 80-4.5 MCG/ACT inhaler Inhale 2 puffs into the lungs 2 (two) times daily.   Calcium Carbonate-Vitamin D (CALCIUM PLUS VITAMIN D PO) Take 1 tablet by mouth daily.   clopidogrel (PLAVIX) 75 MG tablet TAKE 1 TABLET EVERY DAY   Continuous Blood Gluc Receiver (DEXCOM G6 RECEIVER) DEVI Use for continuous blood glucose monitoring DX E11.65   Continuous Blood Gluc Sensor (DEXCOM G6 SENSOR) MISC Use for Continuous blood glucose monitoring.  Replace every 10 days  DX E11.65   Continuous Blood Gluc Transmit (DEXCOM G6 TRANSMITTER) MISC Use as directed for continuous glucose monitoring DX E11.65    cyclobenzaprine (FLEXERIL) 10 MG tablet TAKE 1 TABLET THREE TIMES DAILY AS NEEDED FOR MUSCLE SPASM(S)   diphenhydrAMINE (BENADRYL) 25 mg capsule Take 25 mg by mouth every 6 (six) hours as needed for allergies.   fluconazole (DIFLUCAN) 150 MG tablet Take 1 tab po  once take po once and may repeat in 3 days if symptoms persists   fosfomycin (MONUROL) 3 g PACK Take 3 g by mouth once.   gabapentin (NEURONTIN) 300 MG capsule TAKE 1 CAPSULE THREE TIMES DAILY   glyBURIDE (DIABETA) 5 MG tablet TAKE 1 TABLET TWICE DAILY WITH MEALS   JARDIANCE 25 MG TABS tablet TAKE 1 TABLET EVERY DAY   lovastatin (MEVACOR) 10 MG tablet TAKE 1 TABLET AT BEDTIME   meloxicam (MOBIC) 15 MG tablet Take 15 mg by mouth daily.   metoprolol succinate (TOPROL-XL) 25 MG 24 hr tablet Take 1 tablet by mouth daily.   OXYGEN Inhale into the lungs. 2 litre at night   pantoprazole (PROTONIX) 40 MG tablet TAKE 1 TABLET EVERY DAY   TRUE METRIX BLOOD GLUCOSE TEST test strip TEST BLOOD SUGAR TWICE DAILY AS DIRECTED   TRUEplus Lancets 30G MISC by Does not apply route. Use as directed twice a daily Dx E11.65   [DISCONTINUED] traMADol (ULTRAM) 50 MG tablet TAKE 1 TABLET EVERY 12 HOURS AS NEEDED   traMADol (ULTRAM) 50 MG tablet TAKE 1 TABLET EVERY 12 HOURS AS NEEDED   No facility-administered encounter medications on file as  of 09/30/2022.    Surgical History: Past Surgical History:  Procedure Laterality Date   AMPUTATION Left 04/23/2021   Procedure: AMPUTATION BELOW KNEE;  Surgeon: [name], MD;  Location: [facility];  Service: General;  Laterality: Left;   AMPUTATION TOE Left 02/25/2017   Procedure: AMPUTATION TOE-LEFT 2ND MPJ;  Surgeon: Samara Deist, DPM;  Location: [facility];  Service: Podiatry;  Laterality: Left;   AMPUTATION TOE Left 10/20/2018   Procedure: TOE MPJ T2 LEFT;  Surgeon: Samara Deist, DPM;  Location: [facility];  Service: Podiatry;  Laterality: Left;   AMPUTATION TOE Left 01/12/2019   Procedure: AMPUTATION LEFT 5TH TOE  AND JOINT;  Surgeon: Samara Deist, DPM;  Location: [facility];  Service: Podiatry;  Laterality: Left;   BACK SURGERY  2008   BONE BIOPSY Left 11/21/2020   Procedure: BONE BIOPSY;  Surgeon: Samara Deist, DPM;  Location: [facility];  Service: Podiatry;  Laterality: Left;   CHOLECYSTECTOMY     COLONOSCOPY WITH PROPOFOL N/A 08/15/2017   Procedure: COLONOSCOPY WITH PROPOFOL;  Surgeon: Lollie Sails, MD;  [facility];  Service: Endoscopy;  Laterality: N/A;   ENDARTERECTOMY Right 05/12/2017   Procedure: ENDARTERECTOMY CAROTID;  Surgeon: [name], MD;  Location: [facility];  Service: Vascular;  Laterality: Right;   ENDARTERECTOMY FEMORAL Left 12/01/2018   Procedure: ENDARTERECTOMY FEMORAL;  Surgeon: [name], MD;  Location: [facility];  Service: Vascular;  Laterality: Left;   ERCP     FOOT SURGERY Right    x 3   IRRIGATION AND DEBRIDEMENT FOOT Left 11/21/2020   Procedure: IRRIGATION AND DEBRIDEMENT FOOT--with placement of wound vac;  Surgeon: Samara Deist, DPM;  Location: [facility];  Service: Podiatry;  Laterality: Left;   LOWER EXTREMITY ANGIOGRAM Left 12/01/2018   Procedure: LOWER EXTREMITY ANGIOGRAM;  Surgeon: [name], MD;  Location: [facility];  Service: Vascular;  Laterality: Left;   LOWER EXTREMITY ANGIOGRAPHY Left 10/30/2018   Procedure: LOWER EXTREMITY ANGIOGRAPHY;  Surgeon: [name], MD;  Location: [facility];  Service: Cardiovascular;  Laterality: Left;   LOWER EXTREMITY ANGIOGRAPHY Left 11/30/2018   Procedure: LOWER EXTREMITY ANGIOGRAPHY;  Surgeon: [name], MD;  Location: [facility];  Service: Cardiovascular;  Laterality: Left;   LOWER EXTREMITY ANGIOGRAPHY Left 11/17/2020   Procedure: Lower Extremity Angiography;  Surgeon: [name], MD;  Location: [facility];  Service: Cardiovascular;  Laterality: Left;   LOWER EXTREMITY ANGIOGRAPHY Left 04/22/2021   Procedure: Lower Extremity Angiography;  Surgeon: [name], MD;  Location: [facility];  Service: Cardiovascular;  Laterality: Left;   TEE WITHOUT CARDIOVERSION N/A 11/14/2020   Procedure: TRANSESOPHAGEAL ECHOCARDIOGRAM (TEE);  Surgeon: Corey Skains, MD;  Location: [facility];  Service: Cardiovascular;  Laterality: N/A;    Medical History: Past Medical History:  Diagnosis Date   Cholecystitis, chronic    Cholelithiasis    COPD (chronic obstructive pulmonary disease) ([place])    Diabetes mellitus without complication ([place])    Diabetic retinopathy ([place])    Dyspnea    Fatty liver 2008   Fracture of clavicle 1992   left    Fracture of ribs, multiple 1992   3 ribs   GERD (gastroesophageal reflux disease)    Hyperlipidemia    Hypertension    Paroxysmal SVT (supraventricular tachycardia)    Pelvis fracture ([place]) 1992   Peripheral vascular disease (HCC)    Pleurisy    Pleurisy    Seasonal allergies  Family History: Family History  Problem Relation Age of Onset   Diabetes Sister     Social History   Socioeconomic History   Marital status: Single    Spouse name: Not on file   Number of children: Not on file   Years of education: Not on file   Highest education level: Not on file  Occupational History   Not on file  Tobacco Use   Smoking status: Former    Types: Cigarettes    Quit date: 02/24/1997    Years since quitting: 25.6   Smokeless tobacco: Never  Vaping Use   Vaping Use: Never used  Substance and Sexual Activity   Alcohol use: No   Drug use: No   Sexual activity: Not on file  Other Topics Concern   Not on file  Social History Narrative   Not on file   Social Determinants of Health   Financial Resource Strain: Low Risk  (02/17/2021)   Overall Financial Resource Strain (CARDIA)    Difficulty of Paying Living Expenses: Not very hard  Food Insecurity: No Food Insecurity (08/17/2021)   Hunger Vital Sign    Worried About Running Out of Food in the Last Year: Never true    Ran Out of Food in the Last Year: Never true   Transportation Needs: No Transportation Needs (08/17/2021)   PRAPARE - Hydrologist (Medical): No    Lack of Transportation (Non-Medical): No  Physical Activity: Not on file  Stress: Not on file  Social Connections: Not on file  Intimate Partner Violence: Not on file      Review of Systems  Constitutional:  Negative for chills, fatigue and unexpected weight change.  HENT:  Negative for congestion, rhinorrhea, sneezing and sore throat.   Eyes:  Negative for redness.  Respiratory:  Negative for cough, chest tightness and shortness of breath.   Cardiovascular:  Negative for chest pain and palpitations.  Gastrointestinal:  Negative for abdominal pain, constipation, diarrhea, nausea and vomiting.  Genitourinary:  Negative for dysuria and frequency.  Musculoskeletal:  Positive for gait problem. Negative for arthralgias, back pain, joint swelling and neck pain.  Skin:  Negative for rash.  Neurological:  Negative for tremors and numbness.  Hematological:  Negative for adenopathy. Does not bruise/bleed easily.  Psychiatric/Behavioral:  Negative for behavioral problems (Depression), sleep disturbance and suicidal ideas. The patient is not nervous/anxious.     Vital Signs: BP 128/76 Comment: 125/106  Pulse 63   Temp 98.2 F (36.8 C)   Resp 16   Ht _0  (1.778 m)   Wt 157 lb (71.2 kg)   SpO2 98%   BMI 22.53 kg/m    Physical Exam Vitals and nursing note reviewed.  Constitutional:      General: He is not in acute distress.    Appearance: He is well-developed. He is not diaphoretic.  HENT:     Head: Normocephalic and atraumatic.     Mouth/Throat:     Pharynx: No oropharyngeal exudate.  Eyes:     Pupils: Pupils are equal, round, and reactive to light.  Neck:     Thyroid: No thyromegaly.     Vascular: No JVD.     Trachea: No tracheal deviation.  Cardiovascular:     Rate and Rhythm: Normal rate and regular rhythm.     Heart sounds: Normal heart  sounds. No murmur heard.    No friction rub. No gallop.  Pulmonary:     Effort: Pulmonary  effort is normal. No respiratory distress.     Breath sounds: No wheezing or rales.  Chest:     Chest wall: No tenderness.  Abdominal:     General: Bowel sounds are normal.     Palpations: Abdomen is soft.  Musculoskeletal:        General: Normal range of motion.     Cervical back: Normal range of motion and neck supple.     Comments: LLE BKA, wearing prosthetic, walking with cane  Lymphadenopathy:     Cervical: No cervical adenopathy.  Skin:    General: Skin is warm and dry.  Neurological:     Mental Status: He is alert and oriented to person, place, and time.     Cranial Nerves: No cranial nerve deficit.     Gait: Gait abnormal.  Psychiatric:        Behavior: Behavior normal.        Thought Content: Thought content normal.        Judgment: Judgment normal.        Assessment/Plan: 1. Type 2 diabetes mellitus with diabetic polyneuropathy, without long-term current use of insulin (HCC) - POCT HgB A1C is 6.4 which is improved since 6.5 last visit despite not having Jardiance since sample last visit. Will continue glyburide and close monitoring  2. Essential hypertension Stable, continue current medications and monitor at home  3. Primary osteoarthritis of shoulders, bilateral - traMADol (ULTRAM) 50 MG tablet; TAKE 1 TABLET EVERY 12 HOURS AS NEEDED  Dispense: 45 tablet; Refill: 1   General Counseling: Lenon verbalizes understanding of the findings of todays visit and agrees with plan of treatment. I have discussed any further diagnostic evaluation that may be needed or ordered today. We also reviewed his medications today. he has been encouraged to call the office with any questions or concerns that should arise related to todays visit.    Orders Placed This Encounter  Procedures   POCT HgB A1C    Meds ordered this encounter  Medications   traMADol (ULTRAM) 50 MG tablet     Sig: TAKE 1 TABLET EVERY 12 HOURS AS NEEDED    Dispense:  45 tablet    Refill:  1    This patient was seen by Drema Dallas, PA-C in collaboration with Dr. Clayborn Bigness as a part of collaborative care agreement.   Total time spent:30 Minutes Time spent includes review of chart, medications, test results, and follow up plan with the patient.      Dr [name] Internal medicine

## 2022-10-25 DIAGNOSIS — I739 Peripheral vascular disease, unspecified: Secondary | ICD-10-CM | POA: Diagnosis not present

## 2022-10-25 DIAGNOSIS — Z89512 Acquired absence of left leg below knee: Secondary | ICD-10-CM | POA: Diagnosis not present

## 2022-10-25 DIAGNOSIS — B351 Tinea unguium: Secondary | ICD-10-CM | POA: Diagnosis not present

## 2022-10-25 DIAGNOSIS — E1142 Type 2 diabetes mellitus with diabetic polyneuropathy: Secondary | ICD-10-CM | POA: Diagnosis not present

## 2022-12-29 ENCOUNTER — Other Ambulatory Visit: Payer: Self-pay | Admitting: Physician Assistant

## 2022-12-29 DIAGNOSIS — I471 Supraventricular tachycardia, unspecified: Secondary | ICD-10-CM | POA: Diagnosis not present

## 2022-12-29 DIAGNOSIS — E785 Hyperlipidemia, unspecified: Secondary | ICD-10-CM | POA: Diagnosis not present

## 2022-12-29 DIAGNOSIS — G8929 Other chronic pain: Secondary | ICD-10-CM

## 2022-12-29 DIAGNOSIS — I5030 Unspecified diastolic (congestive) heart failure: Secondary | ICD-10-CM | POA: Diagnosis not present

## 2022-12-29 DIAGNOSIS — E1151 Type 2 diabetes mellitus with diabetic peripheral angiopathy without gangrene: Secondary | ICD-10-CM | POA: Diagnosis not present

## 2022-12-29 DIAGNOSIS — I428 Other cardiomyopathies: Secondary | ICD-10-CM | POA: Diagnosis not present

## 2022-12-29 DIAGNOSIS — I739 Peripheral vascular disease, unspecified: Secondary | ICD-10-CM | POA: Diagnosis not present

## 2022-12-29 DIAGNOSIS — I110 Hypertensive heart disease with heart failure: Secondary | ICD-10-CM | POA: Diagnosis not present

## 2022-12-29 DIAGNOSIS — I10 Essential (primary) hypertension: Secondary | ICD-10-CM | POA: Diagnosis not present

## 2023-01-03 ENCOUNTER — Encounter: Payer: Self-pay | Admitting: Physician Assistant

## 2023-01-03 ENCOUNTER — Ambulatory Visit (INDEPENDENT_AMBULATORY_CARE_PROVIDER_SITE_OTHER): Payer: Medicare HMO | Admitting: Physician Assistant

## 2023-01-03 VITALS — BP 128/62 | HR 88 | Temp 98.0°F | Resp 16 | Ht 70.0 in | Wt 156.0 lb

## 2023-01-03 DIAGNOSIS — I10 Essential (primary) hypertension: Secondary | ICD-10-CM

## 2023-01-03 DIAGNOSIS — M19012 Primary osteoarthritis, left shoulder: Secondary | ICD-10-CM | POA: Diagnosis not present

## 2023-01-03 DIAGNOSIS — E1142 Type 2 diabetes mellitus with diabetic polyneuropathy: Secondary | ICD-10-CM

## 2023-01-03 DIAGNOSIS — M19011 Primary osteoarthritis, right shoulder: Secondary | ICD-10-CM

## 2023-01-03 LAB — POCT GLYCOSYLATED HEMOGLOBIN (HGB A1C): Hemoglobin A1C: 7.6 % — AB (ref 4.0–5.6)

## 2023-01-03 MED ORDER — TRAMADOL HCL 50 MG PO TABS: ORAL_TABLET | ORAL | 1 refills | Status: DC

## 2023-01-03 MED ORDER — GABAPENTIN 300 MG PO CAPS: ORAL_CAPSULE | ORAL | 3 refills | Status: DC

## 2023-01-03 NOTE — Progress Notes (Signed)
[facility] [address] [place], [place] [address]  Internal MEDICINE  Office Visit Note  Patient Name: [name]  [id number]  [id number]  Date of Service: 01/03/2023  Chief Complaint  Patient presents with   Follow-up   Diabetes   Hyperlipidemia   Hypertension    HPI Pt is here for routine follow up -BG 95-200 in AM, taking his medication -Taking Glyburide BID, no jardiance due to donut hole and had been doing well without it last check -Had more sweets at holidays and if eating out at night or grabbing cookie before bed then his sugar will be up. He is going ot really work on getting back on track with diet  -may restart jardiance if numbers not improving and insurance will cover again. Could send  again and start back on 1/2 tab. Could not tolerate farxiga or metformin previously.-BP: on the low side initially, but this can be typical for him. Denies dizziness or lightheadedness. Cardiology dropped metoprolol down to   Current Medication: Outpatient Encounter Medications as of 01/03/2023  Medication Sig   acetaminophen (TYLENOL) 325 MG tablet Take 650 mg by mouth every 6 (six) hours as needed for moderate pain.   aspirin EC 81 MG tablet Take 81 mg by mouth daily.   Blood Glucose Monitoring Suppl (TRUE METRIX AIR GLUCOSE METER) w/Device KIT USE AS DIRECTED TO CHECK GLUCOSE   budesonide-formoterol (SYMBICORT) 80-4.5 MCG/ACT inhaler Inhale 2 puffs into the lungs 2 (two) times daily.   Calcium Carbonate-Vitamin D (CALCIUM PLUS VITAMIN D PO) Take 1 tablet by mouth daily.   clopidogrel (PLAVIX) 75 MG tablet TAKE 1 TABLET EVERY DAY   Continuous Blood Gluc Receiver (DEXCOM G6 RECEIVER) DEVI Use for continuous blood glucose monitoring DX E11.65   Continuous Blood Gluc Sensor (DEXCOM G6 SENSOR) MISC Use for Continuous blood glucose monitoring.  Replace every 10 days  DX E11.65   Continuous Blood Gluc Transmit (DEXCOM G6 TRANSMITTER) MISC Use as directed for  continuous glucose monitoring DX E11.65   cyclobenzaprine (FLEXERIL) 10 MG tablet TAKE 1 TABLET THREE TIMES DAILY AS NEEDED FOR MUSCLE SPASM(S)   diphenhydrAMINE (BENADRYL) 25 mg capsule Take 25 mg by mouth every 6 (six) hours as needed for allergies.   fluconazole (DIFLUCAN) 150 MG tablet Take 1 tab po  once take po once and may repeat in 3 days if symptoms persists   fosfomycin (MONUROL) 3 g PACK Take 3 g by mouth once.   glyBURIDE (DIABETA) 5 MG tablet TAKE 1 TABLET TWICE DAILY WITH MEALS   lovastatin (MEVACOR) 10 MG tablet TAKE 1 TABLET AT BEDTIME   meloxicam (MOBIC) 15 MG tablet Take 15 mg by mouth daily.   metoprolol succinate (TOPROL-XL) 25 MG 24 hr tablet Take 1 tablet by mouth daily.   OXYGEN Inhale into the lungs. 2 litre at night   pantoprazole (PROTONIX) 40 MG tablet TAKE 1 TABLET EVERY DAY   TRUE METRIX BLOOD GLUCOSE TEST test strip TEST BLOOD SUGAR TWICE DAILY AS DIRECTED   TRUEplus Lancets 30G MISC by Does not apply route. Use as directed twice a daily Dx E11.65   [DISCONTINUED] gabapentin (NEURONTIN) 300 MG capsule TAKE 1 CAPSULE THREE TIMES DAILY   [DISCONTINUED] JARDIANCE 25 MG TABS tablet TAKE 1 TABLET EVERY DAY   [DISCONTINUED] traMADol (ULTRAM) 50 MG tablet TAKE 1 TABLET EVERY 12 HOURS AS NEEDED   gabapentin (NEURONTIN) 300 MG capsule TAKE 1 CAPSULE THREE TIMES DAILY   traMADol (ULTRAM) 50 MG tablet TAKE 1  TABLET EVERY 12 HOURS AS NEEDED   No facility-administered encounter medications on file as of 01/03/2023.    Surgical History: Past Surgical History:  Procedure Laterality Date   AMPUTATION Left 04/23/2021   Procedure: AMPUTATION BELOW KNEE;  Surgeon: Annice Needy, MD;  Location: [facility];  Service: General;  Laterality: Left;   AMPUTATION TOE Left 02/25/2017   Procedure: AMPUTATION TOE-LEFT 2ND MPJ;  Surgeon: Gwyneth Revels, DPM;  Location: [facility];  Service: Podiatry;  Laterality: Left;   AMPUTATION TOE Left 10/20/2018   Procedure: TOE MPJ T2 LEFT;  Surgeon: Gwyneth Revels, DPM;  Location: [facility];  Service: Podiatry;  Laterality: Left;   AMPUTATION TOE Left 01/12/2019   Procedure: AMPUTATION LEFT 5TH TOE AND JOINT;  Surgeon: Gwyneth Revels, DPM;  Location: [facility];  Service: Podiatry;  Laterality: Left;   BACK SURGERY  2008   BONE BIOPSY Left 11/21/2020   Procedure: BONE BIOPSY;  Surgeon: Gwyneth Revels, DPM;  Location: [facility];  Service: Podiatry;  Laterality: Left;   CHOLECYSTECTOMY     COLONOSCOPY WITH PROPOFOL N/A 08/15/2017   Procedure: COLONOSCOPY WITH PROPOFOL;  Surgeon: Christena Deem, MD;  Location: [facility];  Service: Endoscopy;  Laterality: N/A;   ENDARTERECTOMY Right 05/12/2017   Procedure: ENDARTERECTOMY CAROTID;  Surgeon: Annice Needy, MD;  Location: [facility];  Service: Vascular;  Laterality: Right;   ENDARTERECTOMY FEMORAL Left 12/01/2018   Procedure: ENDARTERECTOMY FEMORAL;  Surgeon: Annice Needy, MD;  Location: [facility];  Service: Vascular;  Laterality: Left;   ERCP     FOOT SURGERY Right    x 3   IRRIGATION AND DEBRIDEMENT FOOT Left 11/21/2020   Procedure: IRRIGATION AND DEBRIDEMENT FOOT--with placement of wound vac;  Surgeon: Gwyneth Revels, DPM;  Location: [facility];  Service: Podiatry;  Laterality: Left;   LOWER EXTREMITY ANGIOGRAM Left 12/01/2018   Procedure: LOWER EXTREMITY ANGIOGRAM;  Surgeon: Annice Needy, MD;  Location: [facility];  Service: Vascular;  Laterality: Left;   LOWER EXTREMITY ANGIOGRAPHY Left 10/30/2018   Procedure: LOWER EXTREMITY ANGIOGRAPHY;  Surgeon: Annice Needy, MD;  Location: [facility];  Service: Cardiovascular;  Laterality: Left;   LOWER EXTREMITY ANGIOGRAPHY Left 11/30/2018   Procedure: LOWER EXTREMITY ANGIOGRAPHY;  Surgeon: Annice Needy, MD;  Location: [facility];  Service: Cardiovascular;  Laterality: Left;   LOWER EXTREMITY ANGIOGRAPHY Left 11/17/2020   Procedure: Lower Extremity Angiography;  Surgeon: Annice Needy, MD;  Location: [facility];  Service: Cardiovascular;   Laterality: Left;   LOWER EXTREMITY ANGIOGRAPHY Left 04/22/2021   Procedure: Lower Extremity Angiography;  Surgeon: Annice Needy, MD;  Location: [facility];  Service: Cardiovascular;  Laterality: Left;   TEE WITHOUT CARDIOVERSION N/A 11/14/2020   Procedure: TRANSESOPHAGEAL ECHOCARDIOGRAM (TEE);  Surgeon: Lamar Blinks, MD;  Location: [facility];  Service: Cardiovascular;  Laterality: N/A;    Medical History: Past Medical History:  Diagnosis Date   Cholecystitis, chronic    Cholelithiasis    COPD (chronic obstructive pulmonary disease)    Diabetes mellitus without complication    Diabetic retinopathy    Dyspnea    Fatty liver 2008   Fracture of clavicle 1992   left    Fracture of ribs, multiple 1992   3 ribs   GERD (gastroesophageal reflux disease)    Hyperlipidemia    Hypertension    Paroxysmal SVT (supraventricular tachycardia)    Pelvis fracture 1992   Peripheral vascular disease    Pleurisy  Pleurisy    Seasonal allergies     Family History: Family History  Problem Relation Age of Onset   Diabetes Sister     Social History   Socioeconomic History   Marital status: Single    Spouse name: Not on file   Number of children: Not on file   Years of education: Not on file   Highest education level: Not on file  Occupational History   Not on file  Tobacco Use   Smoking status: Former    Types: Cigarettes    Quit date: 02/24/1997    Years since quitting: 25.8   Smokeless tobacco: Never  Vaping Use   Vaping Use: Never used  Substance and Sexual Activity   Alcohol use: No   Drug use: No   Sexual activity: Not on file  Other Topics Concern   Not on file  Social History Narrative   Not on file   Social Determinants of Health   Financial Resource Strain: Low Risk  (02/17/2021)   Overall Financial Resource Strain (CARDIA)    Difficulty of Paying Living Expenses: Not very hard  Food Insecurity: No Food Insecurity (08/17/2021)   Hunger Vital Sign     Worried About Running Out of Food in the Last Year: Never true    Ran Out of Food in the Last Year: Never true  Transportation Needs: No Transportation Needs (08/17/2021)   PRAPARE - Administrator, Civil ServiceTransportation    Lack of Transportation (Medical): No    Lack of Transportation (Non-Medical): No  Physical Activity: Not on file  Stress: Not on file  Social Connections: Not on file  Intimate Partner Violence: Not on file      Review of Systems  Constitutional:  Negative for chills, fatigue and unexpected weight change.  HENT:  Negative for congestion, rhinorrhea, sneezing and sore throat.   Eyes:  Negative for redness.  Respiratory:  Negative for cough, chest tightness and shortness of breath.   Cardiovascular:  Negative for chest pain and palpitations.  Gastrointestinal:  Negative for abdominal pain, constipation, diarrhea, nausea and vomiting.  Genitourinary:  Negative for dysuria and frequency.  Musculoskeletal:  Positive for gait problem. Negative for arthralgias, back pain, joint swelling and neck pain.  Skin:  Negative for rash.  Neurological:  Negative for tremors and numbness.  Hematological:  Negative for adenopathy. Does not bruise/bleed easily.  Psychiatric/Behavioral:  Negative for behavioral problems (Depression), sleep disturbance and suicidal ideas. The patient is not nervous/anxious.     Vital Signs: BP 128/62 Comment: 98/69  Pulse 88   Temp 98 F (36.7 C)   Resp 16   Ht 5\' 10"  (1.778 m)   Wt 156 lb (70.8 kg)   SpO2 97%   BMI 22.38 kg/m    Physical Exam Vitals and nursing note reviewed.  Constitutional:      General: He is not in acute distress.    Appearance: He is well-developed. He is not diaphoretic.  HENT:     Head: Normocephalic and atraumatic.     Mouth/Throat:     Pharynx: No oropharyngeal exudate.  Eyes:     Pupils: Pupils are equal, round, and reactive to light.  Neck:     Thyroid: No thyromegaly.     Vascular: No JVD.     Trachea: No tracheal  deviation.  Cardiovascular:     Rate and Rhythm: Normal rate and regular rhythm.     Heart sounds: Normal heart sounds. No murmur heard.    No friction rub.  No gallop.  Pulmonary:     Effort: Pulmonary effort is normal. No respiratory distress.     Breath sounds: No wheezing or rales.  Chest:     Chest wall: No tenderness.  Abdominal:     General: Bowel sounds are normal.     Palpations: Abdomen is soft.  Musculoskeletal:        General: Normal range of motion.     Cervical back: Normal range of motion and neck supple.     Comments: LLE BKA, wearing prosthetic, walking with cane  Lymphadenopathy:     Cervical: No cervical adenopathy.  Skin:    General: Skin is warm and dry.  Neurological:     Mental Status: He is alert and oriented to person, place, and time.     Cranial Nerves: No cranial nerve deficit.     Gait: Gait abnormal.  Psychiatric:        Behavior: Behavior normal.        Thought Content: Thought content normal.        Judgment: Judgment normal.        Assessment/Plan: 1. Type 2 diabetes mellitus with diabetic polyneuropathy, without long-term current use of insulin - POCT HgB A1C is 7.6 which is up from 6.4 last visit. He will continue current medication and really get back on track with diet. If numbers not improving may restart jardiance 25mg  at 1/2 tab and increase if needed.  2. Essential hypertension Controlled, continue metoprolol as before and monitor  3. Primary osteoarthritis of shoulders, bilateral - traMADol (ULTRAM) 50 MG tablet; TAKE 1 TABLET EVERY 12 HOURS AS NEEDED  Dispense: 45 tablet; Refill: 1   General Counseling: Daijon verbalizes understanding of the findings of todays visit and agrees with plan of treatment. I have discussed any further diagnostic evaluation that may be needed or ordered today. We also reviewed his medications today. he has been encouraged to call the office with any questions or concerns that should arise related to  todays visit.    Orders Placed This Encounter  Procedures   POCT HgB A1C    Meds ordered this encounter  Medications   gabapentin (NEURONTIN) 300 MG capsule    Sig: TAKE 1 CAPSULE THREE TIMES DAILY    Dispense:  90 capsule    Refill:  3   traMADol (ULTRAM) 50 MG tablet    Sig: TAKE 1 TABLET EVERY 12 HOURS AS NEEDED    Dispense:  45 tablet    Refill:  1    This patient was seen by Lynn Ito, PA-C in collaboration with Dr. Beverely Risen as a part of collaborative care agreement.   Total time spent:30 Minutes Time spent includes review of chart, medications, test results, and follow up plan with the patient.      Dr Lyndon Code Internal medicine

## 2023-01-18 DIAGNOSIS — E113313 Type 2 diabetes mellitus with moderate nonproliferative diabetic retinopathy with macular edema, bilateral: Secondary | ICD-10-CM | POA: Diagnosis not present

## 2023-01-18 DIAGNOSIS — H26492 Other secondary cataract, left eye: Secondary | ICD-10-CM | POA: Diagnosis not present

## 2023-01-26 ENCOUNTER — Ambulatory Visit: Payer: Self-pay

## 2023-01-26 NOTE — Patient Outreach (Signed)
  Care Coordination   01/26/2023 Name: [name] MRN: [id number] DOB: 10/18/1950   Care Coordination Outreach Attempts:  A third unsuccessful outreach was attempted today to offer the patient with information about available care coordination services.  Follow Up Plan:  No further outreach attempts will be made at this time. We have been unable to contact the patient to offer or enroll patient in care coordination services  Encounter Outcome:  No Answer   Care Coordination Interventions:  No, not indicated    SIG Lysle Morales, BSW Social Worker [facility] Care Management  [phone]

## 2023-02-08 ENCOUNTER — Telehealth: Payer: Self-pay | Admitting: Physician Assistant

## 2023-02-08 NOTE — Telephone Encounter (Signed)
Diabetic eye exam scheduled for 05/24/23 @ 9:00 at [facility]

## 2023-02-28 ENCOUNTER — Telehealth: Payer: Medicare HMO | Admitting: Physician Assistant

## 2023-02-28 ENCOUNTER — Encounter: Payer: Self-pay | Admitting: Physician Assistant

## 2023-02-28 VITALS — Resp 16 | Ht 70.0 in

## 2023-02-28 DIAGNOSIS — J449 Chronic obstructive pulmonary disease, unspecified: Secondary | ICD-10-CM

## 2023-02-28 DIAGNOSIS — J069 Acute upper respiratory infection, unspecified: Secondary | ICD-10-CM | POA: Diagnosis not present

## 2023-02-28 MED ORDER — AZITHROMYCIN 250 MG PO TABS: ORAL_TABLET | ORAL | 0 refills | Status: DC

## 2023-02-28 MED ORDER — BENZONATATE 100 MG PO CAPS: 100.0000 mg | ORAL_CAPSULE | Freq: Two times a day (BID) | ORAL | 0 refills | Status: DC | PRN

## 2023-02-28 NOTE — Progress Notes (Signed)
[facility] [address] [place], [place] [address]  Internal MEDICINE  Telephone Visit  Patient Name: [name]  [id number]  [id number]  Date of Service: 02/28/2023  I connected with the patient at 11:43 by telephone and verified the patients identity using two identifiers.   I discussed the limitations, risks, security and privacy concerns of performing an evaluation and management service by telephone and the availability of in person appointments. I also discussed with the patient that there may be a patient responsible charge related to the service.  The patient expressed understanding and agrees to proceed.    Chief Complaint  Patient presents with   Telephone Screen    Telephone Call: [phone]   Telephone Assessment   Cough    Coughing up phlegm, negative covid. Coughing since 02/13/23.    HPI Pt is here for virtual sick visit -Has been coughing up phlegm but about 2 weeks now, mild sinus congestion but not too much -Some wheezing, SOB with coughing spells only -Was taking mucinex and cough med but was not getting better -covid negative -Wears oxygen at night at baseline -He states this feels like previous flares and has done well with zpak before, will call if not improving  Current Medication: Outpatient Encounter Medications as of 02/28/2023  Medication Sig   acetaminophen (TYLENOL) 325 MG tablet Take 650 mg by mouth every 6 (six) hours as needed for moderate pain.   aspirin EC 81 MG tablet Take 81 mg by mouth daily.   azithromycin (ZITHROMAX) 250 MG tablet Take one tab a day for 10 days for uri   benzonatate (TESSALON) 100 MG capsule Take 1 capsule (100 mg total) by mouth 2 (two) times daily as needed for cough.   Blood Glucose Monitoring Suppl (TRUE METRIX AIR GLUCOSE METER) w/Device KIT USE AS DIRECTED TO CHECK GLUCOSE   budesonide-formoterol (SYMBICORT) 80-4.5 MCG/ACT inhaler Inhale 2 puffs into the lungs 2 (two) times daily.   Calcium  Carbonate-Vitamin D (CALCIUM PLUS VITAMIN D PO) Take 1 tablet by mouth daily.   clopidogrel (PLAVIX) 75 MG tablet TAKE 1 TABLET EVERY DAY   Continuous Blood Gluc Receiver (DEXCOM G6 RECEIVER) DEVI Use for continuous blood glucose monitoring DX E11.65   Continuous Blood Gluc Sensor (DEXCOM G6 SENSOR) MISC Use for Continuous blood glucose monitoring.  Replace every 10 days  DX E11.65   Continuous Blood Gluc Transmit (DEXCOM G6 TRANSMITTER) MISC Use as directed for continuous glucose monitoring DX E11.65   cyclobenzaprine (FLEXERIL) 10 MG tablet TAKE 1 TABLET THREE TIMES DAILY AS NEEDED FOR MUSCLE SPASM(S)   diphenhydrAMINE (BENADRYL) 25 mg capsule Take 25 mg by mouth every 6 (six) hours as needed for allergies.   fluconazole (DIFLUCAN) 150 MG tablet Take 1 tab po  once take po once and may repeat in 3 days if symptoms persists   fosfomycin (MONUROL) 3 g PACK Take 3 g by mouth once.   gabapentin (NEURONTIN) 300 MG capsule TAKE 1 CAPSULE THREE TIMES DAILY   glyBURIDE (DIABETA) 5 MG tablet TAKE 1 TABLET TWICE DAILY WITH MEALS   lovastatin (MEVACOR) 10 MG tablet TAKE 1 TABLET AT BEDTIME   meloxicam (MOBIC) 15 MG tablet Take 15 mg by mouth daily.   metoprolol succinate (TOPROL-XL) 25 MG 24 hr tablet Take 1 tablet by mouth daily.   OXYGEN Inhale into the lungs. 2 litre at night   pantoprazole (PROTONIX) 40 MG tablet TAKE 1 TABLET EVERY DAY   traMADol (ULTRAM) 50 MG tablet TAKE  1 TABLET EVERY 12 HOURS AS NEEDED   TRUE METRIX BLOOD GLUCOSE TEST test strip TEST BLOOD SUGAR TWICE DAILY AS DIRECTED   TRUEplus Lancets 30G MISC by Does not apply route. Use as directed twice a daily Dx E11.65   No facility-administered encounter medications on file as of 02/28/2023.    Surgical History: Past Surgical History:  Procedure Laterality Date   AMPUTATION Left 04/23/2021   Procedure: AMPUTATION BELOW KNEE;  Surgeon: Annice Needy, MD;  Location: [facility] ORS;  Service: General;  Laterality: Left;   AMPUTATION TOE Left  02/25/2017   Procedure: AMPUTATION TOE-LEFT 2ND MPJ;  Surgeon: Gwyneth Revels, DPM;  Location: [facility];  Service: Podiatry;  Laterality: Left;   AMPUTATION TOE Left 10/20/2018   Procedure: TOE MPJ T2 LEFT;  Surgeon: Gwyneth Revels, DPM;  Location: [facility];  Service: Podiatry;  Laterality: Left;   AMPUTATION TOE Left 01/12/2019   Procedure: AMPUTATION LEFT 5TH TOE AND JOINT;  Surgeon: Gwyneth Revels, DPM;  Location: [facility];  Service: Podiatry;  Laterality: Left;   BACK SURGERY  2008   BONE BIOPSY Left 11/21/2020   Procedure: BONE BIOPSY;  Surgeon: Gwyneth Revels, DPM;  Location: [facility];  Service: Podiatry;  Laterality: Left;   CHOLECYSTECTOMY     COLONOSCOPY WITH PROPOFOL N/A 08/15/2017   Procedure: COLONOSCOPY WITH PROPOFOL;  Surgeon: Christena Deem, MD;  Location: [facility] ENDOSCOPY;  Service: Endoscopy;  Laterality: N/A;   ENDARTERECTOMY Right 05/12/2017   Procedure: ENDARTERECTOMY CAROTID;  Surgeon: Annice Needy, MD;  Location: [facility];  Service: Vascular;  Laterality: Right;   ENDARTERECTOMY FEMORAL Left 12/01/2018   Procedure: ENDARTERECTOMY FEMORAL;  Surgeon: Annice Needy, MD;  Location: [facility];  Service: Vascular;  Laterality: Left;   ERCP     FOOT SURGERY Right    x 3   IRRIGATION AND DEBRIDEMENT FOOT Left 11/21/2020   Procedure: IRRIGATION AND DEBRIDEMENT FOOT--with placement of wound vac;  Surgeon: Gwyneth Revels, DPM;  Location: [facility];  Service: Podiatry;  Laterality: Left;   LOWER EXTREMITY ANGIOGRAM Left 12/01/2018   Procedure: LOWER EXTREMITY ANGIOGRAM;  Surgeon: Annice Needy, MD;  Location: [facility];  Service: Vascular;  Laterality: Left;   LOWER EXTREMITY ANGIOGRAPHY Left 10/30/2018   Procedure: LOWER EXTREMITY ANGIOGRAPHY;  Surgeon: Annice Needy, MD;  Location: [facility];  Service: Cardiovascular;  Laterality: Left;   LOWER EXTREMITY ANGIOGRAPHY Left 11/30/2018   Procedure: LOWER EXTREMITY ANGIOGRAPHY;  Surgeon: Annice Needy, MD;  Location: [facility];   Service: Cardiovascular;  Laterality: Left;   LOWER EXTREMITY ANGIOGRAPHY Left 11/17/2020   Procedure: Lower Extremity Angiography;  Surgeon: Annice Needy, MD;  Location: [facility];  Service: Cardiovascular;  Laterality: Left;   LOWER EXTREMITY ANGIOGRAPHY Left 04/22/2021   Procedure: Lower Extremity Angiography;  Surgeon: Annice Needy, MD;  Location: [facility];  Service: Cardiovascular;  Laterality: Left;   TEE WITHOUT CARDIOVERSION N/A 11/14/2020   Procedure: TRANSESOPHAGEAL ECHOCARDIOGRAM (TEE);  Surgeon: Lamar Blinks, MD;  Location: [facility];  Service: Cardiovascular;  Laterality: N/A;    Medical History: Past Medical History:  Diagnosis Date   Cholecystitis, chronic    Cholelithiasis    COPD (chronic obstructive pulmonary disease) (HCC)    Diabetes mellitus without complication (HCC)    Diabetic retinopathy (HCC)    Dyspnea    Fatty liver 2008   Fracture of clavicle 1992   left    Fracture of ribs, multiple 1992  3 ribs   GERD (gastroesophageal reflux disease)    Hyperlipidemia    Hypertension    Paroxysmal SVT (supraventricular tachycardia)    Pelvis fracture (HCC) 1992   Peripheral vascular disease (HCC)    Pleurisy    Pleurisy    Seasonal allergies     Family History: Family History  Problem Relation Age of Onset   Diabetes Sister     Social History   Socioeconomic History   Marital status: Single    Spouse name: Not on file   Number of children: Not on file   Years of education: Not on file   Highest education level: Not on file  Occupational History   Not on file  Tobacco Use   Smoking status: Former    Types: Cigarettes    Quit date: 02/24/1997    Years since quitting: 26.0   Smokeless tobacco: Never  Vaping Use   Vaping Use: Never used  Substance and Sexual Activity   Alcohol use: No   Drug use: No   Sexual activity: Not on file  Other Topics Concern   Not on file  Social History Narrative   Not on file   Social  Determinants of Health   Financial Resource Strain: Low Risk  (02/17/2021)   Overall Financial Resource Strain (CARDIA)    Difficulty of Paying Living Expenses: Not very hard  Food Insecurity: No Food Insecurity (08/17/2021)   Hunger Vital Sign    Worried About Running Out of Food in the Last Year: Never true    Ran Out of Food in the Last Year: Never true  Transportation Needs: No Transportation Needs (08/17/2021)   PRAPARE - Administrator, Civil Service (Medical): No    Lack of Transportation (Non-Medical): No  Physical Activity: Not on file  Stress: Not on file  Social Connections: Not on file  Intimate Partner Violence: Not on file      Review of Systems  Constitutional:  Negative for fatigue and fever.  HENT:  Positive for congestion and postnasal drip. Negative for mouth sores.   Respiratory:  Positive for cough and wheezing.   Cardiovascular:  Negative for chest pain.  Genitourinary:  Negative for flank pain.  Psychiatric/Behavioral: Negative.      Vital Signs: Resp 16   Ht 5\' 10"  (1.778 m)   BMI 22.38 kg/m    Observation/Objective:  Pt is able to carry out conversation   Assessment/Plan: 1. Upper respiratory tract infection, unspecified type Will start zpak and may continue mucinex, may take tessalon as needed for cough, advised to call if not improving - azithromycin (ZITHROMAX) 250 MG tablet; Take one tab a day for 10 days for uri  Dispense: 10 tablet; Refill: 0 - benzonatate (TESSALON) 100 MG capsule; Take 1 capsule (100 mg total) by mouth 2 (two) times daily as needed for cough.  Dispense: 20 capsule; Refill: 0  2. COPD with hypoxia (HCC) Continue inhalers and oxygen as before and call if any worsening    General Counseling: Heidi verbalizes understanding of the findings of today's phone visit and agrees with plan of treatment. I have discussed any further diagnostic evaluation that may be needed or ordered today. We also reviewed his  medications today. he has been encouraged to call the office with any questions or concerns that should arise related to todays visit.    No orders of the defined types were placed in this encounter.   Meds ordered this encounter  Medications  azithromycin (ZITHROMAX) 250 MG tablet    Sig: Take one tab a day for 10 days for uri    Dispense:  10 tablet    Refill:  0   benzonatate (TESSALON) 100 MG capsule    Sig: Take 1 capsule (100 mg total) by mouth 2 (two) times daily as needed for cough.    Dispense:  20 capsule    Refill:  0    Time spent:25 Minutes    Dr Lyndon Code Internal medicine

## 2023-03-14 DIAGNOSIS — H524 Presbyopia: Secondary | ICD-10-CM | POA: Diagnosis not present

## 2023-03-17 ENCOUNTER — Encounter: Payer: Self-pay | Admitting: Physician Assistant

## 2023-03-17 ENCOUNTER — Telehealth: Payer: Medicare HMO | Admitting: Physician Assistant

## 2023-03-17 VITALS — Ht 70.0 in | Wt 156.0 lb

## 2023-03-17 DIAGNOSIS — J440 Chronic obstructive pulmonary disease with acute lower respiratory infection: Secondary | ICD-10-CM | POA: Diagnosis not present

## 2023-03-17 DIAGNOSIS — J209 Acute bronchitis, unspecified: Secondary | ICD-10-CM

## 2023-03-17 DIAGNOSIS — J449 Chronic obstructive pulmonary disease, unspecified: Secondary | ICD-10-CM | POA: Diagnosis not present

## 2023-03-17 MED ORDER — AZITHROMYCIN 250 MG PO TABS: ORAL_TABLET | ORAL | 0 refills | Status: DC

## 2023-03-17 MED ORDER — BENZONATATE 100 MG PO CAPS: 100.0000 mg | ORAL_CAPSULE | Freq: Two times a day (BID) | ORAL | 0 refills | Status: DC | PRN

## 2023-03-17 NOTE — Progress Notes (Signed)
[facility] [address] [place], [place] [address]  Internal MEDICINE  Telephone Visit  Patient Name: [name]  [id number]  [id number]  Date of Service: 03/17/2023  I connected with the patient at 10:33 by telephone and verified the patients identity using two identifiers.   I discussed the limitations, risks, security and privacy concerns of performing an evaluation and management service by telephone and the availability of in person appointments. I also discussed with the patient that there may be a patient responsible charge related to the service.  The patient expressed understanding and agrees to proceed.    Chief Complaint  Patient presents with   Telephone Assessment   Telephone Screen   Cough    For 3 weeks  and clear mucus     HPI Pt is here for virtual sick visit -he was seen 3 weeks ago for acute cough and treated with zpak and tessalon -he continues to have cough today, but otherwise feels well -a little sinus congestion,  but clear phlegm that he coughs up.  -Denies wheezing, SOB, fever, chills, or fatigue -using his symbicort -Does state he felt better on the zpak last time, it just never fully went away. Discussed an additional course but if not improving consider chest xray  Current Medication: Outpatient Encounter Medications as of 03/17/2023  Medication Sig   acetaminophen (TYLENOL) 325 MG tablet Take 650 mg by mouth every 6 (six) hours as needed for moderate pain.   aspirin EC 81 MG tablet Take 81 mg by mouth daily.   Blood Glucose Monitoring Suppl (TRUE METRIX AIR GLUCOSE METER) w/Device KIT USE AS DIRECTED TO CHECK GLUCOSE   budesonide-formoterol (SYMBICORT) 80-4.5 MCG/ACT inhaler Inhale 2 puffs into the lungs 2 (two) times daily.   Calcium Carbonate-Vitamin D (CALCIUM PLUS VITAMIN D PO) Take 1 tablet by mouth daily.   clopidogrel (PLAVIX) 75 MG tablet TAKE 1 TABLET EVERY DAY   Continuous Blood Gluc Receiver (DEXCOM G6 RECEIVER) DEVI Use  for continuous blood glucose monitoring DX E11.65   Continuous Blood Gluc Sensor (DEXCOM G6 SENSOR) MISC Use for Continuous blood glucose monitoring.  Replace every 10 days  DX E11.65   Continuous Blood Gluc Transmit (DEXCOM G6 TRANSMITTER) MISC Use as directed for continuous glucose monitoring DX E11.65   cyclobenzaprine (FLEXERIL) 10 MG tablet TAKE 1 TABLET THREE TIMES DAILY AS NEEDED FOR MUSCLE SPASM(S)   diphenhydrAMINE (BENADRYL) 25 mg capsule Take 25 mg by mouth every 6 (six) hours as needed for allergies.   fluconazole (DIFLUCAN) 150 MG tablet Take 1 tab po  once take po once and may repeat in 3 days if symptoms persists   fosfomycin (MONUROL) 3 g PACK Take 3 g by mouth once.   gabapentin (NEURONTIN) 300 MG capsule TAKE 1 CAPSULE THREE TIMES DAILY   glyBURIDE (DIABETA) 5 MG tablet TAKE 1 TABLET TWICE DAILY WITH MEALS   lovastatin (MEVACOR) 10 MG tablet TAKE 1 TABLET AT BEDTIME   meloxicam (MOBIC) 15 MG tablet Take 15 mg by mouth daily.   metoprolol succinate (TOPROL-XL) 25 MG 24 hr tablet Take 1 tablet by mouth daily.   OXYGEN Inhale into the lungs. 2 litre at night   pantoprazole (PROTONIX) 40 MG tablet TAKE 1 TABLET EVERY DAY   traMADol (ULTRAM) 50 MG tablet TAKE 1 TABLET EVERY 12 HOURS AS NEEDED   TRUE METRIX BLOOD GLUCOSE TEST test strip TEST BLOOD SUGAR TWICE DAILY AS DIRECTED   TRUEplus Lancets 30G MISC by Does not apply  route. Use as directed twice a daily Dx E11.65   [DISCONTINUED] azithromycin (ZITHROMAX) 250 MG tablet Take one tab a day for 10 days for uri   [DISCONTINUED] benzonatate (TESSALON) 100 MG capsule Take 1 capsule (100 mg total) by mouth 2 (two) times daily as needed for cough.   azithromycin (ZITHROMAX) 250 MG tablet Take one tab a day for 10 days for uri   benzonatate (TESSALON) 100 MG capsule Take 1 capsule (100 mg total) by mouth 2 (two) times daily as needed for cough.   No facility-administered encounter medications on file as of 03/17/2023.    Surgical  History: Past Surgical History:  Procedure Laterality Date   AMPUTATION Left 04/23/2021   Procedure: AMPUTATION BELOW KNEE;  Surgeon: Annice Needy, MD;  Location: [facility];  Service: General;  Laterality: Left;   AMPUTATION TOE Left 02/25/2017   Procedure: AMPUTATION TOE-LEFT 2ND MPJ;  Surgeon: Gwyneth Revels, DPM;  Location: [facility];  Service: Podiatry;  Laterality: Left;   AMPUTATION TOE Left 10/20/2018   Procedure: TOE MPJ T2 LEFT;  Surgeon: Gwyneth Revels, DPM;  Location: [facility];  Service: Podiatry;  Laterality: Left;   AMPUTATION TOE Left 01/12/2019   Procedure: AMPUTATION LEFT 5TH TOE AND JOINT;  Surgeon: Gwyneth Revels, DPM;  Location: [facility];  Service: Podiatry;  Laterality: Left;   BACK SURGERY  2008   BONE BIOPSY Left 11/21/2020   Procedure: BONE BIOPSY;  Surgeon: Gwyneth Revels, DPM;  Location: [facility];  Service: Podiatry;  Laterality: Left;   CHOLECYSTECTOMY     COLONOSCOPY WITH PROPOFOL N/A 08/15/2017   Procedure: COLONOSCOPY WITH PROPOFOL;  Surgeon: Christena Deem, MD;  Location: [facility] ENDOSCOPY;  Service: Endoscopy;  Laterality: N/A;   ENDARTERECTOMY Right 05/12/2017   Procedure: ENDARTERECTOMY CAROTID;  Surgeon: Annice Needy, MD;  Location: [facility];  Service: Vascular;  Laterality: Right;   ENDARTERECTOMY FEMORAL Left 12/01/2018   Procedure: ENDARTERECTOMY FEMORAL;  Surgeon: Annice Needy, MD;  Location: [facility];  Service: Vascular;  Laterality: Left;   ERCP     FOOT SURGERY Right    x 3   IRRIGATION AND DEBRIDEMENT FOOT Left 11/21/2020   Procedure: IRRIGATION AND DEBRIDEMENT FOOT--with placement of wound vac;  Surgeon: Gwyneth Revels, DPM;  Location: [facility];  Service: Podiatry;  Laterality: Left;   LOWER EXTREMITY ANGIOGRAM Left 12/01/2018   Procedure: LOWER EXTREMITY ANGIOGRAM;  Surgeon: Annice Needy, MD;  Location: [facility];  Service: Vascular;  Laterality: Left;   LOWER EXTREMITY ANGIOGRAPHY Left 10/30/2018   Procedure: LOWER EXTREMITY ANGIOGRAPHY;  Surgeon: Annice Needy, MD;  Location: [facility];  Service: Cardiovascular;  Laterality: Left;   LOWER EXTREMITY ANGIOGRAPHY Left 11/30/2018   Procedure: LOWER EXTREMITY ANGIOGRAPHY;  Surgeon: Annice Needy, MD;  Location: [facility];  Service: Cardiovascular;  Laterality: Left;   LOWER EXTREMITY ANGIOGRAPHY Left 11/17/2020   Procedure: Lower Extremity Angiography;  Surgeon: Annice Needy, MD;  Location: [facility];  Service: Cardiovascular;  Laterality: Left;   LOWER EXTREMITY ANGIOGRAPHY Left 04/22/2021   Procedure: Lower Extremity Angiography;  Surgeon: Annice Needy, MD;  Location: [facility];  Service: Cardiovascular;  Laterality: Left;   TEE WITHOUT CARDIOVERSION N/A 11/14/2020   Procedure: TRANSESOPHAGEAL ECHOCARDIOGRAM (TEE);  Surgeon: Lamar Blinks, MD;  Location: [facility];  Service: Cardiovascular;  Laterality: N/A;    Medical History: Past Medical History:  Diagnosis Date   Cholecystitis, chronic    Cholelithiasis  COPD (chronic obstructive pulmonary disease) (HCC)    Diabetes mellitus without complication (HCC)    Diabetic retinopathy (HCC)    Dyspnea    Fatty liver 2008   Fracture of clavicle 1992   left    Fracture of ribs, multiple 1992   3 ribs   GERD (gastroesophageal reflux disease)    Hyperlipidemia    Hypertension    Paroxysmal SVT (supraventricular tachycardia)    Pelvis fracture (HCC) 1992   Peripheral vascular disease (HCC)    Pleurisy    Pleurisy    Seasonal allergies     Family History: Family History  Problem Relation Age of Onset   Diabetes Sister     Social History   Socioeconomic History   Marital status: Single    Spouse name: Not on file   Number of children: Not on file   Years of education: Not on file   Highest education level: Not on file  Occupational History   Not on file  Tobacco Use   Smoking status: Former    Types: Cigarettes    Quit date: 02/24/1997    Years since quitting: 26.0   Smokeless tobacco:  Never  Vaping Use   Vaping Use: Never used  Substance and Sexual Activity   Alcohol use: No   Drug use: No   Sexual activity: Not on file  Other Topics Concern   Not on file  Social History Narrative   Not on file   Social Determinants of Health   Financial Resource Strain: Low Risk  (02/17/2021)   Overall Financial Resource Strain (CARDIA)    Difficulty of Paying Living Expenses: Not very hard  Food Insecurity: No Food Insecurity (08/17/2021)   Hunger Vital Sign    Worried About Running Out of Food in the Last Year: Never true    Ran Out of Food in the Last Year: Never true  Transportation Needs: No Transportation Needs (08/17/2021)   PRAPARE - Administrator, Civil Service (Medical): No    Lack of Transportation (Non-Medical): No  Physical Activity: Not on file  Stress: Not on file  Social Connections: Not on file  Intimate Partner Violence: Not on file      Review of Systems  Constitutional:  Negative for chills, fatigue and fever.  HENT:  Positive for congestion and postnasal drip. Negative for mouth sores.   Respiratory:  Positive for cough. Negative for shortness of breath and wheezing.   Cardiovascular:  Negative for chest pain.  Genitourinary:  Negative for flank pain.  Psychiatric/Behavioral: Negative.      Vital Signs: Ht 5\' 10"  (1.778 m)   Wt 156 lb (70.8 kg)   BMI 22.38 kg/m    Observation/Objective:  Pt is able to carry out conversation   Assessment/Plan: 1. COPD (chronic obstructive pulmonary disease) with acute bronchitis (HCC) Will treat with additional course as patient did feel better when taking. If not improving he will call and will consider chest xray and/or course of prednisone--deferred for now due to diabetes and cough as only symptom.  - benzonatate (TESSALON) 100 MG capsule; Take 1 capsule (100 mg total) by mouth 2 (two) times daily as needed for cough.  Dispense: 20 capsule; Refill: 0 - azithromycin (ZITHROMAX) 250 MG  tablet; Take one tab a day for 10 days for uri  Dispense: 10 tablet; Refill: 0  2. COPD with hypoxia (HCC) Continue inhalers and oxygen as prescribed   General Counseling: Jeanpaul verbalizes understanding of the findings of  today's phone visit and agrees with plan of treatment. I have discussed any further diagnostic evaluation that may be needed or ordered today. We also reviewed his medications today. he has been encouraged to call the office with any questions or concerns that should arise related to todays visit.    No orders of the defined types were placed in this encounter.   Meds ordered this encounter  Medications   benzonatate (TESSALON) 100 MG capsule    Sig: Take 1 capsule (100 mg total) by mouth 2 (two) times daily as needed for cough.    Dispense:  20 capsule    Refill:  0   azithromycin (ZITHROMAX) 250 MG tablet    Sig: Take one tab a day for 10 days for uri    Dispense:  10 tablet    Refill:  0    Time spent:30 Minutes    Dr Lyndon Code Internal medicine

## 2023-03-28 DIAGNOSIS — I739 Peripheral vascular disease, unspecified: Secondary | ICD-10-CM | POA: Diagnosis not present

## 2023-03-28 DIAGNOSIS — E1142 Type 2 diabetes mellitus with diabetic polyneuropathy: Secondary | ICD-10-CM | POA: Diagnosis not present

## 2023-03-28 DIAGNOSIS — Z89512 Acquired absence of left leg below knee: Secondary | ICD-10-CM | POA: Diagnosis not present

## 2023-04-05 ENCOUNTER — Other Ambulatory Visit (INDEPENDENT_AMBULATORY_CARE_PROVIDER_SITE_OTHER): Payer: Self-pay | Admitting: Nurse Practitioner

## 2023-05-05 ENCOUNTER — Ambulatory Visit: Payer: Medicare HMO | Admitting: Physician Assistant

## 2023-05-10 ENCOUNTER — Other Ambulatory Visit: Payer: Self-pay | Admitting: Physician Assistant

## 2023-05-10 DIAGNOSIS — M19011 Primary osteoarthritis, right shoulder: Secondary | ICD-10-CM

## 2023-05-10 NOTE — Telephone Encounter (Signed)
Please send med next 05/20/23

## 2023-05-16 DIAGNOSIS — E113312 Type 2 diabetes mellitus with moderate nonproliferative diabetic retinopathy with macular edema, left eye: Secondary | ICD-10-CM | POA: Diagnosis not present

## 2023-05-16 DIAGNOSIS — E113391 Type 2 diabetes mellitus with moderate nonproliferative diabetic retinopathy without macular edema, right eye: Secondary | ICD-10-CM | POA: Diagnosis not present

## 2023-05-16 DIAGNOSIS — Z961 Presence of intraocular lens: Secondary | ICD-10-CM | POA: Diagnosis not present

## 2023-05-16 DIAGNOSIS — H26492 Other secondary cataract, left eye: Secondary | ICD-10-CM | POA: Diagnosis not present

## 2023-05-16 DIAGNOSIS — E113313 Type 2 diabetes mellitus with moderate nonproliferative diabetic retinopathy with macular edema, bilateral: Secondary | ICD-10-CM | POA: Diagnosis not present

## 2023-05-18 ENCOUNTER — Telehealth: Payer: Self-pay

## 2023-05-18 NOTE — Telephone Encounter (Signed)
We received a fax from [facility] for lower back braces and I called pt daughter she don't not no they are and she don't know that they talk to any one

## 2023-05-20 ENCOUNTER — Ambulatory Visit (INDEPENDENT_AMBULATORY_CARE_PROVIDER_SITE_OTHER): Payer: Medicare HMO | Admitting: Physician Assistant

## 2023-05-20 ENCOUNTER — Telehealth: Payer: Self-pay

## 2023-05-20 ENCOUNTER — Encounter: Payer: Self-pay | Admitting: Physician Assistant

## 2023-05-20 VITALS — BP 138/70 | HR 62 | Temp 97.7°F | Resp 16 | Ht 70.0 in | Wt 154.0 lb

## 2023-05-20 DIAGNOSIS — R946 Abnormal results of thyroid function studies: Secondary | ICD-10-CM

## 2023-05-20 DIAGNOSIS — R5383 Other fatigue: Secondary | ICD-10-CM

## 2023-05-20 DIAGNOSIS — R300 Dysuria: Secondary | ICD-10-CM

## 2023-05-20 DIAGNOSIS — E782 Mixed hyperlipidemia: Secondary | ICD-10-CM

## 2023-05-20 DIAGNOSIS — N390 Urinary tract infection, site not specified: Secondary | ICD-10-CM

## 2023-05-20 DIAGNOSIS — I739 Peripheral vascular disease, unspecified: Secondary | ICD-10-CM

## 2023-05-20 DIAGNOSIS — E1142 Type 2 diabetes mellitus with diabetic polyneuropathy: Secondary | ICD-10-CM

## 2023-05-20 DIAGNOSIS — Z89512 Acquired absence of left leg below knee: Secondary | ICD-10-CM | POA: Diagnosis not present

## 2023-05-20 DIAGNOSIS — I10 Essential (primary) hypertension: Secondary | ICD-10-CM

## 2023-05-20 LAB — POCT GLYCOSYLATED HEMOGLOBIN (HGB A1C): Hemoglobin A1C: 7.2 % — AB (ref 4.0–5.6)

## 2023-05-20 LAB — POCT URINALYSIS DIPSTICK
Bilirubin, UA: NEGATIVE
Blood, UA: NEGATIVE
Glucose, UA: NEGATIVE
Ketones, UA: NEGATIVE
Leukocytes, UA: NEGATIVE
Nitrite, UA: NEGATIVE
Protein, UA: NEGATIVE
Spec Grav, UA: 1.01 (ref 1.010–1.025)
Urobilinogen, UA: 0.2 E.U./dL
pH, UA: 5 (ref 5.0–8.0)

## 2023-05-20 MED ORDER — PANTOPRAZOLE SODIUM 40 MG PO TBEC: 40.0000 mg | DELAYED_RELEASE_TABLET | Freq: Every day | ORAL | 10 refills | Status: DC

## 2023-05-20 NOTE — Telephone Encounter (Signed)
Pt advised [facility] ok we sending labs for culture

## 2023-05-20 NOTE — Progress Notes (Signed)
[facility] [address] [place], [place] [address]  Internal MEDICINE  Office Visit Note  Patient Name: [name]  [id number]  [id number]  Date of Service: 05/24/2023  Chief Complaint  Patient presents with   Follow-up   Diabetes   Gastroesophageal Reflux   Hypertension   Hyperlipidemia    HPI Pt is here for routine follow up, his daughter is with him -Will be starting injections for diabetic retinopathy soon -BG fluctuate 90-180, working on diet -Glyburide 2 x per day, hoping to retry approval for dexcom due to poor circulation making finger sticks difficult.  -HR varies as well -BP stable -needs new diabetic shoes, is followed by podiatry and saw them last month. He needs diabetic shoes due to previous amputation and poor circulation due to PVD and diabetes -sees cardiology soon  Current Medication: Outpatient Encounter Medications as of 05/20/2023  Medication Sig   acetaminophen (TYLENOL) 325 MG tablet Take 650 mg by mouth every 6 (six) hours as needed for moderate pain.   aspirin EC 81 MG tablet Take 81 mg by mouth daily.   azithromycin (ZITHROMAX) 250 MG tablet Take one tab a day for 10 days for uri   benzonatate (TESSALON) 100 MG capsule Take 1 capsule (100 mg total) by mouth 2 (two) times daily as needed for cough.   Blood Glucose Monitoring Suppl (TRUE METRIX AIR GLUCOSE METER) w/Device KIT USE AS DIRECTED TO CHECK GLUCOSE   budesonide-formoterol (SYMBICORT) 80-4.5 MCG/ACT inhaler Inhale 2 puffs into the lungs 2 (two) times daily.   Calcium Carbonate-Vitamin D (CALCIUM PLUS VITAMIN D PO) Take 1 tablet by mouth daily.   clopidogrel (PLAVIX) 75 MG tablet TAKE 1 TABLET EVERY DAY   Continuous Blood Gluc Receiver (DEXCOM G6 RECEIVER) DEVI Use for continuous blood glucose monitoring DX E11.65   Continuous Blood Gluc Sensor (DEXCOM G6 SENSOR) MISC Use for Continuous blood glucose monitoring.  Replace every 10 days  DX E11.65   Continuous Blood Gluc Transmit  (DEXCOM G6 TRANSMITTER) MISC Use as directed for continuous glucose monitoring DX E11.65   cyclobenzaprine (FLEXERIL) 10 MG tablet TAKE 1 TABLET THREE TIMES DAILY AS NEEDED FOR MUSCLE SPASM(S)   diphenhydrAMINE (BENADRYL) 25 mg capsule Take 25 mg by mouth every 6 (six) hours as needed for allergies.   fluconazole (DIFLUCAN) 150 MG tablet Take 1 tab po  once take po once and may repeat in 3 days if symptoms persists   fosfomycin (MONUROL) 3 g PACK Take 3 g by mouth once.   gabapentin (NEURONTIN) 300 MG capsule TAKE 1 CAPSULE THREE TIMES DAILY   glyBURIDE (DIABETA) 5 MG tablet TAKE 1 TABLET TWICE DAILY WITH MEALS   lovastatin (MEVACOR) 10 MG tablet TAKE 1 TABLET AT BEDTIME   meloxicam (MOBIC) 15 MG tablet Take 15 mg by mouth daily.   metoprolol succinate (TOPROL-XL) 25 MG 24 hr tablet Take 1 tablet by mouth daily.   OXYGEN Inhale into the lungs. 2 litre at night   traMADol (ULTRAM) 50 MG tablet TAKE 1 TABLET EVERY 12 HOURS AS NEEDED   TRUE METRIX BLOOD GLUCOSE TEST test strip TEST BLOOD SUGAR TWICE DAILY AS DIRECTED   TRUEplus Lancets 30G MISC by Does not apply route. Use as directed twice a daily Dx E11.65   [DISCONTINUED] pantoprazole (PROTONIX) 40 MG tablet TAKE 1 TABLET EVERY DAY   pantoprazole (PROTONIX) 40 MG tablet Take 1 tablet (40 mg total) by mouth daily.   No facility-administered encounter medications on file as of 05/20/2023.  Surgical History: Past Surgical History:  Procedure Laterality Date   AMPUTATION Left 04/23/2021   Procedure: AMPUTATION BELOW KNEE;  Surgeon: Annice Needy, MD;  Location: [facility];  Service: General;  Laterality: Left;   AMPUTATION TOE Left 02/25/2017   Procedure: AMPUTATION TOE-LEFT 2ND MPJ;  Surgeon: Gwyneth Revels, DPM;  Location: [facility];  Service: Podiatry;  Laterality: Left;   AMPUTATION TOE Left 10/20/2018   Procedure: TOE MPJ T2 LEFT;  Surgeon: Gwyneth Revels, DPM;  Location: [facility];  Service: Podiatry;  Laterality: Left;   AMPUTATION TOE Left  01/12/2019   Procedure: AMPUTATION LEFT 5TH TOE AND JOINT;  Surgeon: Gwyneth Revels, DPM;  Location: [facility];  Service: Podiatry;  Laterality: Left;   BACK SURGERY  2008   BONE BIOPSY Left 11/21/2020   Procedure: BONE BIOPSY;  Surgeon: Gwyneth Revels, DPM;  Location: [facility];  Service: Podiatry;  Laterality: Left;   CHOLECYSTECTOMY     COLONOSCOPY WITH PROPOFOL N/A 08/15/2017   Procedure: COLONOSCOPY WITH PROPOFOL;  Surgeon: Christena Deem, MD;  Location: [facility];  Service: Endoscopy;  Laterality: N/A;   ENDARTERECTOMY Right 05/12/2017   Procedure: ENDARTERECTOMY CAROTID;  Surgeon: Annice Needy, MD;  Location: [facility];  Service: Vascular;  Laterality: Right;   ENDARTERECTOMY FEMORAL Left 12/01/2018   Procedure: ENDARTERECTOMY FEMORAL;  Surgeon: Annice Needy, MD;  Location: [facility];  Service: Vascular;  Laterality: Left;   ERCP     FOOT SURGERY Right    x 3   IRRIGATION AND DEBRIDEMENT FOOT Left 11/21/2020   Procedure: IRRIGATION AND DEBRIDEMENT FOOT--with placement of wound vac;  Surgeon: Gwyneth Revels, DPM;  Location: [facility];  Service: Podiatry;  Laterality: Left;   LOWER EXTREMITY ANGIOGRAM Left 12/01/2018   Procedure: LOWER EXTREMITY ANGIOGRAM;  Surgeon: Annice Needy, MD;  Location: [facility];  Service: Vascular;  Laterality: Left;   LOWER EXTREMITY ANGIOGRAPHY Left 10/30/2018   Procedure: LOWER EXTREMITY ANGIOGRAPHY;  Surgeon: Annice Needy, MD;  Location: [facility];  Service: Cardiovascular;  Laterality: Left;   LOWER EXTREMITY ANGIOGRAPHY Left 11/30/2018   Procedure: LOWER EXTREMITY ANGIOGRAPHY;  Surgeon: Annice Needy, MD;  Location: [facility];  Service: Cardiovascular;  Laterality: Left;   LOWER EXTREMITY ANGIOGRAPHY Left 11/17/2020   Procedure: Lower Extremity Angiography;  Surgeon: Annice Needy, MD;  Location: [facility];  Service: Cardiovascular;  Laterality: Left;   LOWER EXTREMITY ANGIOGRAPHY Left 04/22/2021   Procedure: Lower Extremity  Angiography;  Surgeon: Annice Needy, MD;  Location: [facility];  Service: Cardiovascular;  Laterality: Left;   TEE WITHOUT CARDIOVERSION N/A 11/14/2020   Procedure: TRANSESOPHAGEAL ECHOCARDIOGRAM (TEE);  Surgeon: Lamar Blinks, MD;  Location: [facility];  Service: Cardiovascular;  Laterality: N/A;    Medical History: Past Medical History:  Diagnosis Date   Cholecystitis, chronic    Cholelithiasis    COPD (chronic obstructive pulmonary disease) (HCC)    Diabetes mellitus without complication (HCC)    Diabetic retinopathy (HCC)    Dyspnea    Fatty liver 2008   Fracture of clavicle 1992   left    Fracture of ribs, multiple 1992   3 ribs   GERD (gastroesophageal reflux disease)    Hyperlipidemia    Hypertension    Paroxysmal SVT (supraventricular tachycardia)    Pelvis fracture (HCC) 1992   Peripheral vascular disease (HCC)    Pleurisy    Pleurisy    Seasonal allergies     Family History:  Family History  Problem Relation Age of Onset   Diabetes Sister     Social History   Socioeconomic History   Marital status: Single    Spouse name: Not on file   Number of children: Not on file   Years of education: Not on file   Highest education level: Not on file  Occupational History   Not on file  Tobacco Use   Smoking status: Former    Current packs/day: 0.00    Types: Cigarettes    Quit date: 02/24/1997    Years since quitting: 26.2   Smokeless tobacco: Never  Vaping Use   Vaping status: Never Used  Substance and Sexual Activity   Alcohol use: No   Drug use: No   Sexual activity: Not on file  Other Topics Concern   Not on file  Social History Narrative   Not on file   Social Determinants of Health   Financial Resource Strain: Low Risk  (02/17/2021)   Overall Financial Resource Strain (CARDIA)    Difficulty of Paying Living Expenses: Not very hard  Food Insecurity: No Food Insecurity (08/17/2021)   Hunger Vital Sign    Worried About Running Out of  Food in the Last Year: Never true    Ran Out of Food in the Last Year: Never true  Transportation Needs: No Transportation Needs (08/17/2021)   PRAPARE - Administrator, Civil Service (Medical): No    Lack of Transportation (Non-Medical): No  Physical Activity: Not on file  Stress: Not on file  Social Connections: Not on file  Intimate Partner Violence: Not on file      Review of Systems  Constitutional:  Negative for chills, fatigue and unexpected weight change.  HENT:  Negative for congestion, rhinorrhea, sneezing and sore throat.   Eyes:  Negative for redness.  Respiratory:  Negative for cough, chest tightness and shortness of breath.   Cardiovascular:  Negative for chest pain and palpitations.  Gastrointestinal:  Negative for abdominal pain, constipation, diarrhea, nausea and vomiting.  Genitourinary:  Negative for dysuria and frequency.  Musculoskeletal:  Positive for gait problem. Negative for arthralgias, back pain, joint swelling and neck pain.  Skin:  Negative for rash.  Neurological:  Negative for tremors and numbness.  Hematological:  Negative for adenopathy. Does not bruise/bleed easily.  Psychiatric/Behavioral:  Negative for behavioral problems (Depression), sleep disturbance and suicidal ideas. The patient is not nervous/anxious.     Vital Signs: BP 138/70 Comment: 150/80  Pulse 62   Temp 97.7 F (36.5 C)   Resp 16   Ht 5\' 10"  (1.778 m)   Wt 154 lb (69.9 kg)   SpO2 97%   BMI 22.10 kg/m    Physical Exam Vitals and nursing note reviewed.  Constitutional:      General: He is not in acute distress.    Appearance: He is well-developed. He is not diaphoretic.  HENT:     Head: Normocephalic and atraumatic.     Mouth/Throat:     Pharynx: No oropharyngeal exudate.  Eyes:     Pupils: Pupils are equal, round, and reactive to light.  Neck:     Thyroid: No thyromegaly.     Vascular: No JVD.     Trachea: No tracheal deviation.  Cardiovascular:      Rate and Rhythm: Normal rate and regular rhythm.     Heart sounds: Normal heart sounds. No murmur heard.    No friction rub. No gallop.  Pulmonary:  Effort: Pulmonary effort is normal. No respiratory distress.     Breath sounds: No wheezing or rales.  Chest:     Chest wall: No tenderness.  Abdominal:     General: Bowel sounds are normal.     Palpations: Abdomen is soft.  Musculoskeletal:        General: Normal range of motion.     Cervical back: Normal range of motion and neck supple.     Comments: LLE BKA, wearing prosthetic, walking with cane  Lymphadenopathy:     Cervical: No cervical adenopathy.  Skin:    General: Skin is warm and dry.  Neurological:     Mental Status: He is alert and oriented to person, place, and time.     Cranial Nerves: No cranial nerve deficit.     Gait: Gait abnormal.  Psychiatric:        Behavior: Behavior normal.        Thought Content: Thought content normal.        Judgment: Judgment normal.        Assessment/Plan: 1. Type 2 diabetes mellitus with diabetic polyneuropathy, without long-term current use of insulin (HCC) - POCT HgB A1C is 7.2 which is improved from 7.6 despite no longer taking SGLT2. Will continue glyburide and continue to monitor, may need to restart SGLT2 if no further improvement next check, but this is costly. Will also retry continuous monitor. Nees diabetic shoes due to diabetes with poor circulation and neuropathy.  2. Essential hypertension Stable, continue current medications  3. S/P below knee amputation, left (HCC) Wearing prosthetic, followed by vascular  4. PVD (peripheral vascular disease) (HCC) Will need diabetic shoes due to diabetes, poor circulation with PVC and due to prior need for amputation as a result of these.  5. Urinary tract infection without hematuria, site unspecified - CULTURE, URINE COMPREHENSIVE  6. Dysuria - POCT Urinalysis Dipstick  7. Mixed hyperlipidemia - Lipid Panel With LDL/HDL  Ratio  8. Abnormal thyroid exam - TSH + free T4  9. Other fatigue - CBC w/Diff/Platelet - Comprehensive metabolic panel - TSH + free T4 - Lipid Panel With LDL/HDL Ratio   General Counseling: [name] verbalizes understanding of the findings of todays visit and agrees with plan of treatment. I have discussed any further diagnostic evaluation that may be needed or ordered today. We also reviewed his medications today. he has been encouraged to call the office with any questions or concerns that should arise related to todays visit.    Orders Placed This Encounter  Procedures   CULTURE, URINE COMPREHENSIVE   CBC w/Diff/Platelet   Comprehensive metabolic panel   TSH + free T4   Lipid Panel With LDL/HDL Ratio   POCT HgB A1C   POCT Urinalysis Dipstick    Meds ordered this encounter  Medications   pantoprazole (PROTONIX) 40 MG tablet    Sig: Take 1 tablet (40 mg total) by mouth daily.    Dispense:  90 tablet    Refill:  10    This patient was seen by Lynn Ito, PA-C in collaboration with Dr. Beverely Risen as a part of collaborative care agreement.   Total time spent:30 Minutes Time spent includes review of chart, medications, test results, and follow up plan with the patient.      Dr Lyndon Code Internal medicine

## 2023-05-24 ENCOUNTER — Telehealth: Payer: Self-pay

## 2023-05-24 NOTE — Telephone Encounter (Signed)
Completed P.A. for patient's Dexcom G7 sensor.

## 2023-05-24 NOTE — Telephone Encounter (Signed)
Faxed diabetic shoe  to [facility] [phone]

## 2023-05-25 ENCOUNTER — Telehealth: Payer: Self-pay

## 2023-05-25 LAB — CULTURE, URINE COMPREHENSIVE

## 2023-05-25 NOTE — Telephone Encounter (Signed)
Pt daughter advised that urine culture came back normal

## 2023-05-25 NOTE — Telephone Encounter (Signed)
-----   Message from Carlean Jews sent at 05/25/2023  9:43 AM EDT ----- Please let him know that urine culture was normal

## 2023-05-27 ENCOUNTER — Telehealth: Payer: Self-pay

## 2023-05-27 NOTE — Telephone Encounter (Signed)
Completed P.A. for patient's Dexcom G7 sensor.

## 2023-05-31 ENCOUNTER — Telehealth: Payer: Self-pay

## 2023-06-06 DIAGNOSIS — E113312 Type 2 diabetes mellitus with moderate nonproliferative diabetic retinopathy with macular edema, left eye: Secondary | ICD-10-CM | POA: Diagnosis not present

## 2023-06-09 NOTE — Telephone Encounter (Signed)
Notified patient about Dexcom denial.

## 2023-06-27 ENCOUNTER — Other Ambulatory Visit: Payer: Self-pay | Admitting: Physician Assistant

## 2023-06-27 DIAGNOSIS — R5383 Other fatigue: Secondary | ICD-10-CM | POA: Diagnosis not present

## 2023-06-27 DIAGNOSIS — M19011 Primary osteoarthritis, right shoulder: Secondary | ICD-10-CM

## 2023-06-27 DIAGNOSIS — R946 Abnormal results of thyroid function studies: Secondary | ICD-10-CM | POA: Diagnosis not present

## 2023-06-27 DIAGNOSIS — E782 Mixed hyperlipidemia: Secondary | ICD-10-CM | POA: Diagnosis not present

## 2023-06-28 LAB — TSH+FREE T4
Free T4: 1.22 ng/dL (ref 0.82–1.77)
TSH: 2.33 u[IU]/mL (ref 0.450–4.500)

## 2023-06-28 LAB — CBC WITH DIFFERENTIAL/PLATELET
Basophils Absolute: 0 10*3/uL (ref 0.0–0.2)
Basos: 1 %
EOS (ABSOLUTE): 0.2 10*3/uL (ref 0.0–0.4)
Eos: 4 %
Hematocrit: 43.9 % (ref 37.5–51.0)
Hemoglobin: 14.7 g/dL (ref 13.0–17.7)
Immature Grans (Abs): 0 10*3/uL (ref 0.0–0.1)
Immature Granulocytes: 0 %
Lymphocytes Absolute: 1.5 10*3/uL (ref 0.7–3.1)
Lymphs: 32 %
MCH: 30.1 pg (ref 26.6–33.0)
MCHC: 33.5 g/dL (ref 31.5–35.7)
MCV: 90 fL (ref 79–97)
Monocytes Absolute: 0.4 10*3/uL (ref 0.1–0.9)
Monocytes: 8 %
Neutrophils Absolute: 2.5 10*3/uL (ref 1.4–7.0)
Neutrophils: 55 %
Platelets: 157 10*3/uL (ref 150–450)
RBC: 4.88 x10E6/uL (ref 4.14–5.80)
RDW: 12.1 % (ref 11.6–15.4)
WBC: 4.6 10*3/uL (ref 3.4–10.8)

## 2023-06-28 LAB — COMPREHENSIVE METABOLIC PANEL
ALT: 14 [IU]/L (ref 0–44)
AST: 21 [IU]/L (ref 0–40)
Albumin: 4.7 g/dL (ref 3.9–4.9)
Alkaline Phosphatase: 67 [IU]/L (ref 44–121)
BUN/Creatinine Ratio: 26 — ABNORMAL HIGH (ref 10–24)
BUN: 30 mg/dL — ABNORMAL HIGH (ref 8–27)
Bilirubin Total: 1.6 mg/dL — ABNORMAL HIGH (ref 0.0–1.2)
CO2: 28 mmol/L (ref 20–29)
Calcium: 10.1 mg/dL (ref 8.6–10.2)
Chloride: 96 mmol/L (ref 96–106)
Creatinine, Ser: 1.17 mg/dL (ref 0.76–1.27)
Globulin, Total: 2.7 g/dL (ref 1.5–4.5)
Glucose: 154 mg/dL — ABNORMAL HIGH (ref 70–99)
Potassium: 5.2 mmol/L (ref 3.5–5.2)
Sodium: 137 mmol/L (ref 134–144)
Total Protein: 7.4 g/dL (ref 6.0–8.5)
eGFR: 67 mL/min/{1.73_m2} (ref >59)

## 2023-06-28 LAB — LIPID PANEL WITH LDL/HDL RATIO
Cholesterol, Total: 107 mg/dL (ref 100–199)
HDL: 28 mg/dL — ABNORMAL LOW (ref >39)
LDL Chol Calc (NIH): 48 mg/dL (ref 0–99)
LDL/HDL Ratio: 1.7 {ratio} (ref 0.0–3.6)
Triglycerides: 188 mg/dL — ABNORMAL HIGH (ref 0–149)
VLDL Cholesterol Cal: 31 mg/dL (ref 5–40)

## 2023-06-30 DIAGNOSIS — E119 Type 2 diabetes mellitus without complications: Secondary | ICD-10-CM | POA: Diagnosis not present

## 2023-06-30 DIAGNOSIS — I5032 Chronic diastolic (congestive) heart failure: Secondary | ICD-10-CM | POA: Diagnosis not present

## 2023-06-30 DIAGNOSIS — I6523 Occlusion and stenosis of bilateral carotid arteries: Secondary | ICD-10-CM | POA: Diagnosis not present

## 2023-06-30 DIAGNOSIS — J449 Chronic obstructive pulmonary disease, unspecified: Secondary | ICD-10-CM | POA: Diagnosis not present

## 2023-06-30 DIAGNOSIS — I428 Other cardiomyopathies: Secondary | ICD-10-CM | POA: Diagnosis not present

## 2023-06-30 DIAGNOSIS — I471 Supraventricular tachycardia, unspecified: Secondary | ICD-10-CM | POA: Diagnosis not present

## 2023-06-30 DIAGNOSIS — I10 Essential (primary) hypertension: Secondary | ICD-10-CM | POA: Diagnosis not present

## 2023-06-30 DIAGNOSIS — Z23 Encounter for immunization: Secondary | ICD-10-CM | POA: Diagnosis not present

## 2023-06-30 DIAGNOSIS — I110 Hypertensive heart disease with heart failure: Secondary | ICD-10-CM | POA: Diagnosis not present

## 2023-06-30 DIAGNOSIS — E785 Hyperlipidemia, unspecified: Secondary | ICD-10-CM | POA: Diagnosis not present

## 2023-07-04 ENCOUNTER — Ambulatory Visit: Payer: Medicare HMO | Admitting: Physician Assistant

## 2023-07-04 ENCOUNTER — Encounter: Payer: Self-pay | Admitting: Physician Assistant

## 2023-07-04 VITALS — BP 126/78 | HR 56 | Temp 97.7°F | Resp 16 | Ht 70.0 in | Wt 155.0 lb

## 2023-07-04 DIAGNOSIS — Z0001 Encounter for general adult medical examination with abnormal findings: Secondary | ICD-10-CM | POA: Diagnosis not present

## 2023-07-04 DIAGNOSIS — Z23 Encounter for immunization: Secondary | ICD-10-CM | POA: Diagnosis not present

## 2023-07-04 DIAGNOSIS — E113312 Type 2 diabetes mellitus with moderate nonproliferative diabetic retinopathy with macular edema, left eye: Secondary | ICD-10-CM | POA: Diagnosis not present

## 2023-07-04 NOTE — Progress Notes (Signed)
[facility] [address] [place], [place] [address]  Internal MEDICINE  Office Visit Note  Patient Name: [name]  [id number]  [id number]  Date of Service: 07/13/2023  Chief Complaint  Patient presents with   Medicare Wellness   Diabetes   Gastroesophageal Reflux   Hypertension   Hyperlipidemia    HPI [name] presents for an annual well visit and physical exam.  Well-appearing 72 y.o. male Routine CRC screening: Done in 2018, due 2028 Eye exam and/or foot exam: Followed by ophthalmology regularly-last visit 04/2023 and is getting injections now, seeing podiatry in Jan Labs: reviewed and overall stable New or worsening pain: uses tramadol as needed for chronic pain -Vascular appt coming up     06/28/2022    8:58 AM 06/22/2021    9:03 AM 06/20/2020    8:40 AM  MMSE - Mini Mental State Exam  Orientation to time 5 5 5   Orientation to Place 5 5 5   Registration 3 3 3   Attention/ Calculation 5 5 5   Recall 3 3 3   Language- name 2 objects 2 2 2   Language- repeat 1 1 1   Language- follow 3 step command 3 3 3   Language- read & follow direction 1 1 1   Write a sentence 1 1 1   Copy design 1 1 1   Total score 30 30 30     Functional Status Survey:       06/28/2022    8:57 AM 09/30/2022    8:30 AM 01/03/2023    8:27 AM 05/20/2023   10:16 AM 07/04/2023    8:53 AM  Fall Risk  Falls in the past year? 1 1 0 0 1  Was there an injury with Fall? 0 0   0  Fall Risk Category Calculator 2 1   2   Fall Risk Category (Retired) Moderate Low     Patient at Risk for Falls Due to   No Fall Risks    Fall risk Follow up   Falls evaluation completed         07/04/2023    8:53 AM  Depression screen PHQ 2/9  Decreased Interest 0  Down, Depressed, Hopeless 0  PHQ - 2 Score 0        No data to display            Current Medication: Outpatient Encounter Medications as of 07/04/2023  Medication Sig   acetaminophen (TYLENOL) 325 MG tablet Take 650 mg by mouth every 6  (six) hours as needed for moderate pain.   aspirin EC 81 MG tablet Take 81 mg by mouth daily.   Blood Glucose Monitoring Suppl (TRUE METRIX AIR GLUCOSE METER) w/Device KIT USE AS DIRECTED TO CHECK GLUCOSE   budesonide-formoterol (SYMBICORT) 80-4.5 MCG/ACT inhaler Inhale 2 puffs into the lungs 2 (two) times daily.   Calcium Carbonate-Vitamin D (CALCIUM PLUS VITAMIN D PO) Take 1 tablet by mouth daily.   clopidogrel (PLAVIX) 75 MG tablet TAKE 1 TABLET EVERY DAY   Continuous Blood Gluc Receiver (DEXCOM G6 RECEIVER) DEVI Use for continuous blood glucose monitoring DX E11.65   Continuous Blood Gluc Sensor (DEXCOM G6 SENSOR) MISC Use for Continuous blood glucose monitoring.  Replace every 10 days  DX E11.65   Continuous Blood Gluc Transmit (DEXCOM G6 TRANSMITTER) MISC Use as directed for continuous glucose monitoring DX E11.65   cyclobenzaprine (FLEXERIL) 10 MG tablet TAKE 1 TABLET THREE TIMES DAILY AS NEEDED FOR MUSCLE SPASM(S)   diphenhydrAMINE (BENADRYL) 25 mg capsule Take 25 mg  by mouth every 6 (six) hours as needed for allergies.   gabapentin (NEURONTIN) 300 MG capsule TAKE 1 CAPSULE THREE TIMES DAILY   glyBURIDE (DIABETA) 5 MG tablet TAKE 1 TABLET TWICE DAILY WITH MEALS   lovastatin (MEVACOR) 10 MG tablet TAKE 1 TABLET AT BEDTIME   meloxicam (MOBIC) 15 MG tablet Take 15 mg by mouth daily.   metoprolol succinate (TOPROL-XL) 25 MG 24 hr tablet Take 1 tablet by mouth daily.   OXYGEN Inhale into the lungs. 2 litre at night   pantoprazole (PROTONIX) 40 MG tablet Take 1 tablet (40 mg total) by mouth daily.   traMADol (ULTRAM) 50 MG tablet TAKE 1 TABLET EVERY 12 HOURS AS NEEDED   TRUE METRIX BLOOD GLUCOSE TEST test strip TEST BLOOD SUGAR TWICE DAILY AS DIRECTED   TRUEplus Lancets 30G MISC by Does not apply route. Use as directed twice a daily Dx E11.65   [DISCONTINUED] azithromycin (ZITHROMAX) 250 MG tablet Take one tab a day for 10 days for uri (Patient not taking: Reported on 07/12/2023)    [DISCONTINUED] benzonatate (TESSALON) 100 MG capsule Take 1 capsule (100 mg total) by mouth 2 (two) times daily as needed for cough. (Patient not taking: Reported on 07/12/2023)   [DISCONTINUED] fluconazole (DIFLUCAN) 150 MG tablet Take 1 tab po  once take po once and may repeat in 3 days if symptoms persists (Patient not taking: Reported on 07/12/2023)   [DISCONTINUED] fosfomycin (MONUROL) 3 g PACK Take 3 g by mouth once. (Patient not taking: Reported on 07/12/2023)   No facility-administered encounter medications on file as of 07/04/2023.    Surgical History: Past Surgical History:  Procedure Laterality Date   AMPUTATION Left 04/23/2021   Procedure: AMPUTATION BELOW KNEE;  Surgeon: Annice Needy, MD;  Location: [facility];  Service: General;  Laterality: Left;   AMPUTATION TOE Left 02/25/2017   Procedure: AMPUTATION TOE-LEFT 2ND MPJ;  Surgeon: Gwyneth Revels, DPM;  Location: [facility];  Service: Podiatry;  Laterality: Left;   AMPUTATION TOE Left 10/20/2018   Procedure: TOE MPJ T2 LEFT;  Surgeon: Gwyneth Revels, DPM;  Location: [facility];  Service: Podiatry;  Laterality: Left;   AMPUTATION TOE Left 01/12/2019   Procedure: AMPUTATION LEFT 5TH TOE AND JOINT;  Surgeon: Gwyneth Revels, DPM;  Location: [facility];  Service: Podiatry;  Laterality: Left;   BACK SURGERY  2008   BONE BIOPSY Left 11/21/2020   Procedure: BONE BIOPSY;  Surgeon: Gwyneth Revels, DPM;  Location: [facility];  Service: Podiatry;  Laterality: Left;   CHOLECYSTECTOMY     COLONOSCOPY WITH PROPOFOL N/A 08/15/2017   Procedure: COLONOSCOPY WITH PROPOFOL;  Surgeon: Christena Deem, MD;  Location: [facility];  Service: Endoscopy;  Laterality: N/A;   ENDARTERECTOMY Right 05/12/2017   Procedure: ENDARTERECTOMY CAROTID;  Surgeon: Annice Needy, MD;  Location: [facility];  Service: Vascular;  Laterality: Right;   ENDARTERECTOMY FEMORAL Left 12/01/2018   Procedure: ENDARTERECTOMY FEMORAL;  Surgeon: Annice Needy, MD;  Location: [facility];  Service:  Vascular;  Laterality: Left;   ERCP     FOOT SURGERY Right    x 3   IRRIGATION AND DEBRIDEMENT FOOT Left 11/21/2020   Procedure: IRRIGATION AND DEBRIDEMENT FOOT--with placement of wound vac;  Surgeon: Gwyneth Revels, DPM;  Location: [facility];  Service: Podiatry;  Laterality: Left;   LOWER EXTREMITY ANGIOGRAM Left 12/01/2018   Procedure: LOWER EXTREMITY ANGIOGRAM;  Surgeon: Annice Needy, MD;  Location: [facility];  Service: Vascular;  Laterality: Left;   LOWER EXTREMITY  ANGIOGRAPHY Left 10/30/2018   Procedure: LOWER EXTREMITY ANGIOGRAPHY;  Surgeon: Annice Needy, MD;  Location: [facility];  Service: Cardiovascular;  Laterality: Left;   LOWER EXTREMITY ANGIOGRAPHY Left 11/30/2018   Procedure: LOWER EXTREMITY ANGIOGRAPHY;  Surgeon: Annice Needy, MD;  Location: [facility];  Service: Cardiovascular;  Laterality: Left;   LOWER EXTREMITY ANGIOGRAPHY Left 11/17/2020   Procedure: Lower Extremity Angiography;  Surgeon: Annice Needy, MD;  Location: [facility];  Service: Cardiovascular;  Laterality: Left;   LOWER EXTREMITY ANGIOGRAPHY Left 04/22/2021   Procedure: Lower Extremity Angiography;  Surgeon: Annice Needy, MD;  Location: [facility];  Service: Cardiovascular;  Laterality: Left;   TEE WITHOUT CARDIOVERSION N/A 11/14/2020   Procedure: TRANSESOPHAGEAL ECHOCARDIOGRAM (TEE);  Surgeon: Lamar Blinks, MD;  Location: [facility];  Service: Cardiovascular;  Laterality: N/A;    Medical History: Past Medical History:  Diagnosis Date   Cholecystitis, chronic    Cholelithiasis    COPD (chronic obstructive pulmonary disease) (HCC)    Diabetes mellitus without complication (HCC)    Diabetic retinopathy (HCC)    Dyspnea    Fatty liver 2008   Fracture of clavicle 1992   left    Fracture of ribs, multiple 1992   3 ribs   GERD (gastroesophageal reflux disease)    Hyperlipidemia    Hypertension    Paroxysmal SVT (supraventricular tachycardia) (HCC)    Pelvis fracture (HCC)  1992   Peripheral vascular disease (HCC)    Pleurisy    Pleurisy    Seasonal allergies     Family History: Family History  Problem Relation Age of Onset   Diabetes Sister     Social History   Socioeconomic History   Marital status: Single    Spouse name: Not on file   Number of children: Not on file   Years of education: Not on file   Highest education level: Not on file  Occupational History   Not on file  Tobacco Use   Smoking status: Former    Current packs/day: 0.00    Types: Cigarettes    Quit date: 02/24/1997    Years since quitting: 26.3   Smokeless tobacco: Never  Vaping Use   Vaping status: Never Used  Substance and Sexual Activity   Alcohol use: No   Drug use: No   Sexual activity: Not on file  Other Topics Concern   Not on file  Social History Narrative   Not on file   Social Determinants of Health   Financial Resource Strain: Low Risk  (02/17/2021)   Overall Financial Resource Strain (CARDIA)    Difficulty of Paying Living Expenses: Not very hard  Food Insecurity: No Food Insecurity (08/17/2021)   Hunger Vital Sign    Worried About Running Out of Food in the Last Year: Never true    Ran Out of Food in the Last Year: Never true  Transportation Needs: No Transportation Needs (08/17/2021)   PRAPARE - Administrator, Civil Service (Medical): No    Lack of Transportation (Non-Medical): No  Physical Activity: Not on file  Stress: Not on file  Social Connections: Not on file  Intimate Partner Violence: Not on file      Review of Systems  Constitutional:  Negative for chills, fatigue and unexpected weight change.  HENT:  Negative for congestion, rhinorrhea, sneezing and sore throat.   Eyes:  Negative for redness.  Respiratory:  Negative for cough, chest tightness  and shortness of breath.   Cardiovascular:  Negative for chest pain and palpitations.  Gastrointestinal:  Negative for abdominal pain, constipation, diarrhea, nausea and  vomiting.  Genitourinary:  Negative for dysuria and frequency.  Musculoskeletal:  Positive for gait problem. Negative for arthralgias, back pain, joint swelling and neck pain.  Skin:  Negative for rash.  Neurological:  Negative for tremors and numbness.  Hematological:  Negative for adenopathy. Does not bruise/bleed easily.  Psychiatric/Behavioral:  Negative for behavioral problems (Depression), sleep disturbance and suicidal ideas. The patient is not nervous/anxious.     Vital Signs: BP 126/78 Comment: 150/80  Pulse (!) 56   Temp 97.7 F (36.5 C)   Resp 16   Ht 5\' 10"  (1.778 m)   Wt 155 lb (70.3 kg)   SpO2 98%   BMI 22.24 kg/m    Physical Exam Vitals and nursing note reviewed.  Constitutional:      General: He is not in acute distress.    Appearance: He is well-developed. He is not diaphoretic.  HENT:     Head: Normocephalic and atraumatic.     Mouth/Throat:     Pharynx: No oropharyngeal exudate.  Eyes:     Pupils: Pupils are equal, round, and reactive to light.  Neck:     Thyroid: No thyromegaly.     Vascular: No JVD.     Trachea: No tracheal deviation.  Cardiovascular:     Rate and Rhythm: Normal rate and regular rhythm.     Heart sounds: Normal heart sounds. No murmur heard.    No friction rub. No gallop.  Pulmonary:     Effort: Pulmonary effort is normal. No respiratory distress.     Breath sounds: No wheezing or rales.  Chest:     Chest wall: No tenderness.  Abdominal:     General: Bowel sounds are normal.     Palpations: Abdomen is soft.  Musculoskeletal:        General: Normal range of motion.     Cervical back: Normal range of motion and neck supple.     Comments: LLE BKA, wearing prosthetic, walking with cane  Lymphadenopathy:     Cervical: No cervical adenopathy.  Skin:    General: Skin is warm and dry.  Neurological:     Mental Status: He is alert and oriented to person, place, and time.     Cranial Nerves: No cranial nerve deficit.     Gait:  Gait abnormal.  Psychiatric:        Behavior: Behavior normal.        Thought Content: Thought content normal.        Judgment: Judgment normal.        Assessment/Plan: 1. Encounter for general adult medical examination with abnormal findings Cpe performed, UTD on colon screening, and eye exams. Sees podiatry in Jan.  2. Flu vaccine need - Flu vaccine, recombinant, trivalent, inj     General Counseling: [name] verbalizes understanding of the findings of todays visit and agrees with plan of treatment. I have discussed any further diagnostic evaluation that may be needed or ordered today. We also reviewed his medications today. he has been encouraged to call the office with any questions or concerns that should arise related to todays visit.    Orders Placed This Encounter  Procedures   Flu vaccine, recombinant, trivalent, inj    No orders of the defined types were placed in this encounter.   Return in about 2 months (around 09/09/2023) for A1c.  Total time spent:35 Minutes Time spent includes review of chart, medications, test results, and follow up plan with the patient.   [place] Controlled Substance Database was reviewed by me.  This patient was seen by Lynn Ito, PA-C in collaboration with Dr. Beverely Risen as a part of collaborative care agreement.  Lynn Ito, PA-C Internal medicine

## 2023-07-11 ENCOUNTER — Other Ambulatory Visit (INDEPENDENT_AMBULATORY_CARE_PROVIDER_SITE_OTHER): Payer: Self-pay | Admitting: Vascular Surgery

## 2023-07-11 DIAGNOSIS — I6523 Occlusion and stenosis of bilateral carotid arteries: Secondary | ICD-10-CM

## 2023-07-11 DIAGNOSIS — I739 Peripheral vascular disease, unspecified: Secondary | ICD-10-CM

## 2023-07-12 ENCOUNTER — Ambulatory Visit (INDEPENDENT_AMBULATORY_CARE_PROVIDER_SITE_OTHER): Payer: Medicare HMO

## 2023-07-12 ENCOUNTER — Ambulatory Visit (INDEPENDENT_AMBULATORY_CARE_PROVIDER_SITE_OTHER): Payer: Medicare HMO | Admitting: Vascular Surgery

## 2023-07-12 VITALS — BP 156/73 | HR 76 | Resp 17 | Ht 68.0 in | Wt 155.2 lb

## 2023-07-12 DIAGNOSIS — I6523 Occlusion and stenosis of bilateral carotid arteries: Secondary | ICD-10-CM | POA: Diagnosis not present

## 2023-07-12 DIAGNOSIS — E1165 Type 2 diabetes mellitus with hyperglycemia: Secondary | ICD-10-CM | POA: Diagnosis not present

## 2023-07-12 DIAGNOSIS — I70269 Atherosclerosis of native arteries of extremities with gangrene, unspecified extremity: Secondary | ICD-10-CM

## 2023-07-12 DIAGNOSIS — I739 Peripheral vascular disease, unspecified: Secondary | ICD-10-CM | POA: Diagnosis not present

## 2023-07-12 DIAGNOSIS — Z9889 Other specified postprocedural states: Secondary | ICD-10-CM

## 2023-07-12 DIAGNOSIS — I10 Essential (primary) hypertension: Secondary | ICD-10-CM

## 2023-07-12 DIAGNOSIS — I6521 Occlusion and stenosis of right carotid artery: Secondary | ICD-10-CM | POA: Diagnosis not present

## 2023-07-12 NOTE — Assessment & Plan Note (Signed)
Duplex today shows a patent right carotid endarterectomy and stable 1 to 39% left ICA stenosis.  Status post endarterectomy about 6 years ago.  Continue current medical regimen.  Check annually.

## 2023-07-12 NOTE — Assessment & Plan Note (Signed)
blood pressure control important in reducing the progression of atherosclerotic disease. On appropriate oral medications.  

## 2023-07-12 NOTE — Assessment & Plan Note (Signed)
Status post left BKA with prosthesis and doing well.  Right ABI only mildly reduced.  Continue to follow this on an annual basis unless worrisome symptoms develop on the right side.

## 2023-07-12 NOTE — Progress Notes (Signed)
MRN : [id number]  [name] is a 72 y.o. (14-Dec-1950) male who presents with chief complaint of  Chief Complaint  Patient presents with   Venous Insufficiency  .  History of Present Illness: Patient returns today in follow up of multiple vascular issues.  He is doing well.  He lost his left leg from PAD several years ago.  He walks with a prosthesis and is doing fine with that.  No current right leg symptoms.  ABI today was 0.84 on the right. He is also followed for carotid disease.  No focal neurologic symptoms. Specifically, the patient denies amaurosis fugax, speech or swallowing difficulties, or arm or leg weakness or numbness.  Duplex today shows a patent right carotid endarterectomy and stable 1 to 39% left ICA stenosis.  Current Outpatient Medications  Medication Sig Dispense Refill   acetaminophen (TYLENOL) 325 MG tablet Take 650 mg by mouth every 6 (six) hours as needed for moderate pain.     aspirin EC 81 MG tablet Take 81 mg by mouth daily.     Blood Glucose Monitoring Suppl (TRUE METRIX AIR GLUCOSE METER) w/Device KIT USE AS DIRECTED TO CHECK GLUCOSE 1 kit 0   budesonide-formoterol (SYMBICORT) 80-4.5 MCG/ACT inhaler Inhale 2 puffs into the lungs 2 (two) times daily. 1 each 3   Calcium Carbonate-Vitamin D (CALCIUM PLUS VITAMIN D PO) Take 1 tablet by mouth daily.     clopidogrel (PLAVIX) 75 MG tablet TAKE 1 TABLET EVERY DAY 90 tablet 3   Continuous Blood Gluc Receiver (DEXCOM G6 RECEIVER) DEVI Use for continuous blood glucose monitoring DX E11.65 1 each 5   Continuous Blood Gluc Sensor (DEXCOM G6 SENSOR) MISC Use for Continuous blood glucose monitoring.  Replace every 10 days  DX E11.65 3 each 5   Continuous Blood Gluc Transmit (DEXCOM G6 TRANSMITTER) MISC Use as directed for continuous glucose monitoring DX E11.65 1 each 5   cyclobenzaprine (FLEXERIL) 10 MG tablet TAKE 1 TABLET THREE TIMES DAILY AS NEEDED FOR MUSCLE SPASM(S) 270 tablet 3   diphenhydrAMINE (BENADRYL) 25 mg  capsule Take 25 mg by mouth every 6 (six) hours as needed for allergies.     gabapentin (NEURONTIN) 300 MG capsule TAKE 1 CAPSULE THREE TIMES DAILY 90 capsule 3   glyBURIDE (DIABETA) 5 MG tablet TAKE 1 TABLET TWICE DAILY WITH MEALS 180 tablet 10   lovastatin (MEVACOR) 10 MG tablet TAKE 1 TABLET AT BEDTIME 90 tablet 3   meloxicam (MOBIC) 15 MG tablet Take 15 mg by mouth daily.     metoprolol succinate (TOPROL-XL) 25 MG 24 hr tablet Take 1 tablet by mouth daily.     OXYGEN Inhale into the lungs. 2 litre at night     pantoprazole (PROTONIX) 40 MG tablet Take 1 tablet (40 mg total) by mouth daily. 90 tablet 10   traMADol (ULTRAM) 50 MG tablet TAKE 1 TABLET EVERY 12 HOURS AS NEEDED 45 tablet 0   TRUE METRIX BLOOD GLUCOSE TEST test strip TEST BLOOD SUGAR TWICE DAILY AS DIRECTED 200 strip 3   TRUEplus Lancets 30G MISC by Does not apply route. Use as directed twice a daily Dx E11.65     No current facility-administered medications for this visit.    Past Medical History:  Diagnosis Date   Cholecystitis, chronic    Cholelithiasis    COPD (chronic obstructive pulmonary disease) (HCC)    Diabetes mellitus without complication (HCC)    Diabetic retinopathy (HCC)    Dyspnea  Fatty liver 2008   Fracture of clavicle 1992   left    Fracture of ribs, multiple 1992   3 ribs   GERD (gastroesophageal reflux disease)    Hyperlipidemia    Hypertension    Paroxysmal SVT (supraventricular tachycardia) (HCC)    Pelvis fracture (HCC) 1992   Peripheral vascular disease (HCC)    Pleurisy    Pleurisy    Seasonal allergies     Past Surgical History:  Procedure Laterality Date   AMPUTATION Left 04/23/2021   Procedure: AMPUTATION BELOW KNEE;  Surgeon: Annice Needy, MD;  Location: [facility];  Service: General;  Laterality: Left;   AMPUTATION TOE Left 02/25/2017   Procedure: AMPUTATION TOE-LEFT 2ND MPJ;  Surgeon: Gwyneth Revels, DPM;  Location: [facility];  Service: Podiatry;  Laterality: Left;   AMPUTATION  TOE Left 10/20/2018   Procedure: TOE MPJ T2 LEFT;  Surgeon: Gwyneth Revels, DPM;  Location: [facility];  Service: Podiatry;  Laterality: Left;   AMPUTATION TOE Left 01/12/2019   Procedure: AMPUTATION LEFT 5TH TOE AND JOINT;  Surgeon: Gwyneth Revels, DPM;  Location: [facility];  Service: Podiatry;  Laterality: Left;   BACK SURGERY  2008   BONE BIOPSY Left 11/21/2020   Procedure: BONE BIOPSY;  Surgeon: Gwyneth Revels, DPM;  Location: [facility];  Service: Podiatry;  Laterality: Left;   CHOLECYSTECTOMY     COLONOSCOPY WITH PROPOFOL N/A 08/15/2017   Procedure: COLONOSCOPY WITH PROPOFOL;  Surgeon: Christena Deem, MD;  Location: [facility];  Service: Endoscopy;  Laterality: N/A;   ENDARTERECTOMY Right 05/12/2017   Procedure: ENDARTERECTOMY CAROTID;  Surgeon: Annice Needy, MD;  Location: [facility];  Service: Vascular;  Laterality: Right;   ENDARTERECTOMY FEMORAL Left 12/01/2018   Procedure: ENDARTERECTOMY FEMORAL;  Surgeon: Annice Needy, MD;  Location: [facility];  Service: Vascular;  Laterality: Left;   ERCP     FOOT SURGERY Right    x 3   IRRIGATION AND DEBRIDEMENT FOOT Left 11/21/2020   Procedure: IRRIGATION AND DEBRIDEMENT FOOT--with placement of wound vac;  Surgeon: Gwyneth Revels, DPM;  Location: [facility];  Service: Podiatry;  Laterality: Left;   LOWER EXTREMITY ANGIOGRAM Left 12/01/2018   Procedure: LOWER EXTREMITY ANGIOGRAM;  Surgeon: Annice Needy, MD;  Location: [facility];  Service: Vascular;  Laterality: Left;   LOWER EXTREMITY ANGIOGRAPHY Left 10/30/2018   Procedure: LOWER EXTREMITY ANGIOGRAPHY;  Surgeon: Annice Needy, MD;  Location: [facility];  Service: Cardiovascular;  Laterality: Left;   LOWER EXTREMITY ANGIOGRAPHY Left 11/30/2018   Procedure: LOWER EXTREMITY ANGIOGRAPHY;  Surgeon: Annice Needy, MD;  Location: [facility];  Service: Cardiovascular;  Laterality: Left;   LOWER EXTREMITY ANGIOGRAPHY Left 11/17/2020   Procedure: Lower Extremity Angiography;  Surgeon: Annice Needy,  MD;  Location: [facility];  Service: Cardiovascular;  Laterality: Left;   LOWER EXTREMITY ANGIOGRAPHY Left 04/22/2021   Procedure: Lower Extremity Angiography;  Surgeon: Annice Needy, MD;  Location: [facility];  Service: Cardiovascular;  Laterality: Left;   TEE WITHOUT CARDIOVERSION N/A 11/14/2020   Procedure: TRANSESOPHAGEAL ECHOCARDIOGRAM (TEE);  Surgeon: Lamar Blinks, MD;  Location: [facility];  Service: Cardiovascular;  Laterality: N/A;     Social History   Tobacco Use   Smoking status: Former    Current packs/day: 0.00    Types: Cigarettes    Quit date: 02/24/1997    Years since quitting: 26.3   Smokeless tobacco: Never  Vaping Use   Vaping status: Never  Used  Substance Use Topics   Alcohol use: No   Drug use: No       Family History  Problem Relation Age of Onset   Diabetes Sister   No bleeding or clotting disorders No aneurysms  Allergies  Allergen Reactions   Penicillins Rash and Other (See Comments)    Rash in and around the mouth Has patient had a PCN reaction causing immediate rash, facial/tongue/throat swelling, SOB or lightheadedness with hypotension: Yes Has patient had a PCN reaction causing severe rash involving mucus membranes or skin necrosis: Yes Has patient had a PCN reaction that required hospitalization: No Has patient had a PCN reaction occurring within the last 10 years: Yes If all of the above answers are "NO", then may proceed with Cephalosporin use.    Bactrim [Sulfamethoxazole-Trimethoprim] Other (See Comments)    Mouth sores, Sores develop in mouth     REVIEW OF SYSTEMS (Negative unless checked)  Constitutional: [] Weight loss  [] Fever  [] Chills Cardiac: [] Chest pain   [] Chest pressure   [] Palpitations   [] Shortness of breath when laying flat   [] Shortness of breath at rest   [] Shortness of breath with exertion. Vascular:  [] Pain in legs with walking   [] Pain in legs at rest   [] Pain in legs when laying flat    [] Claudication   [] Pain in feet when walking  [] Pain in feet at rest  [] Pain in feet when laying flat   [] History of DVT   [] Phlebitis   [] Swelling in legs   [] Varicose veins   [x] Non-healing ulcers Pulmonary:   [] Uses home oxygen   [] Productive cough   [] Hemoptysis   [] Wheeze  [] COPD   [] Asthma Neurologic:  [] Dizziness  [] Blackouts   [] Seizures   [] History of stroke   [] History of TIA  [] Aphasia   [] Temporary blindness   [] Dysphagia   [] Weakness or numbness in arms   [] Weakness or numbness in legs Musculoskeletal:  [] Arthritis   [] Joint swelling   [x] Joint pain   [] Low back pain Hematologic:  [] Easy bruising  [] Easy bleeding   [] Hypercoagulable state   [] Anemic   Gastrointestinal:  [] Blood in stool   [] Vomiting blood  [] Gastroesophageal reflux/heartburn   [] Abdominal pain Genitourinary:  [] Chronic kidney disease   [] Difficult urination  [x] Frequent urination  [] Burning with urination   [] Hematuria Skin:  [] Rashes   [] Ulcers   [] Wounds Psychological:  [] History of anxiety   []  History of major depression.  Physical Examination  BP (!) 156/73 (BP Location: Right Arm)   Pulse 76   Resp 17   Ht 5\' 8"  (1.727 m)   Wt 155 lb 3.2 oz (70.4 kg)   BMI 23.60 kg/m  Gen:  WD/WN, NAD Head: Waterproof/AT, No temporalis wasting. Ear/Nose/Throat: Hearing grossly intact, nares w/o erythema or drainage Eyes: Conjunctiva clear. Sclera non-icteric Neck: Supple.  Trachea midline Pulmonary:  Good air movement, no use of accessory muscles.  Cardiac: RRR, no JVD Vascular:  Vessel Right Left  Radial Palpable Palpable                          PT 1+ Palpable Not Palpable  DP 1+ Palpable Not Palpable   Gastrointestinal: soft, non-tender/non-distended. No guarding/reflex.  Musculoskeletal: M/S 5/5 throughout.  No deformity or atrophy. Left BKA present with prosthesis in place. No right leg edema. Neurologic: Sensation grossly intact in extremities.  Symmetrical.  Speech is fluent.  Psychiatric: Judgment intact,  Mood & affect appropriate for pt's clinical situation.  Dermatologic: No rashes or ulcers noted.  No cellulitis or open wounds.      Labs Recent Results (from the past 2160 hour(s))  POCT HgB A1C     Status: Abnormal   Collection Time: 05/20/23 10:27 AM  Result Value Ref Range   Hemoglobin A1C 7.2 (A) 4.0 - 5.6 %   HbA1c POC (<> result, manual entry)     HbA1c, POC (prediabetic range)     HbA1c, POC (controlled diabetic range)    CULTURE, URINE COMPREHENSIVE     Status: None   Collection Time: 05/20/23  1:47 PM   Specimen: Urine   Urine  Result Value Ref Range   Urine Culture, Comprehensive Final report    Organism ID, Bacteria Comment     Comment: Mixed urogenital flora 1,000 Colonies/mL   POCT Urinalysis Dipstick     Status: None   Collection Time: 05/20/23  1:51 PM  Result Value Ref Range   Color, UA     Clarity, UA     Glucose, UA Negative Negative   Bilirubin, UA Negative    Ketones, UA Negative    Spec Grav, UA 1.010 1.010 - 1.025   Blood, UA Negative    pH, UA 5.0 5.0 - 8.0   Protein, UA Negative Negative   Urobilinogen, UA 0.2 0.2 or 1.0 E.U./dL   Nitrite, UA Negative    Leukocytes, UA Negative Negative   Appearance     Odor    CBC w/Diff/Platelet     Status: None   Collection Time: 06/27/23  2:12 PM  Result Value Ref Range   WBC 4.6 3.4 - 10.8 x10E3/uL   RBC 4.88 4.14 - 5.80 x10E6/uL   Hemoglobin 14.7 13.0 - 17.7 g/dL   Hematocrit 19.1 47.8 - 51.0 %   MCV 90 79 - 97 fL   MCH 30.1 26.6 - 33.0 pg   MCHC 33.5 31.5 - 35.7 g/dL   RDW 29.5 62.1 - 30.8 %   Platelets 157 150 - 450 x10E3/uL   Neutrophils 55 Not Estab. %   Lymphs 32 Not Estab. %   Monocytes 8 Not Estab. %   Eos 4 Not Estab. %   Basos 1 Not Estab. %   Neutrophils Absolute 2.5 1.4 - 7.0 x10E3/uL   Lymphocytes Absolute 1.5 0.7 - 3.1 x10E3/uL   Monocytes Absolute 0.4 0.1 - 0.9 x10E3/uL   EOS (ABSOLUTE) 0.2 0.0 - 0.4 x10E3/uL   Basophils Absolute 0.0 0.0 - 0.2 x10E3/uL   Immature Granulocytes  0 Not Estab. %   Immature Grans (Abs) 0.0 0.0 - 0.1 x10E3/uL  Comprehensive metabolic panel     Status: Abnormal   Collection Time: 06/27/23  2:12 PM  Result Value Ref Range   Glucose 154 (H) 70 - 99 mg/dL   BUN 30 (H) 8 - 27 mg/dL   Creatinine, Ser 6.57 0.76 - 1.27 mg/dL   eGFR 67 >84 ON/GEX/5.28   BUN/Creatinine Ratio 26 (H) 10 - 24   Sodium 137 134 - 144 mmol/L   Potassium 5.2 3.5 - 5.2 mmol/L   Chloride 96 96 - 106 mmol/L   CO2 28 20 - 29 mmol/L   Calcium 10.1 8.6 - 10.2 mg/dL   Total Protein 7.4 6.0 - 8.5 g/dL   Albumin 4.7 3.9 - 4.9 g/dL   Globulin, Total 2.7 1.5 - 4.5 g/dL   Bilirubin Total 1.6 (H) 0.0 - 1.2 mg/dL   Alkaline Phosphatase 67 44 - 121 IU/L   AST 21 0 -  40 IU/L   ALT 14 0 - 44 IU/L  TSH + free T4     Status: None   Collection Time: 06/27/23  2:12 PM  Result Value Ref Range   TSH 2.330 0.450 - 4.500 uIU/mL   Free T4 1.22 0.82 - 1.77 ng/dL  Lipid Panel With LDL/HDL Ratio     Status: Abnormal   Collection Time: 06/27/23  2:12 PM  Result Value Ref Range   Cholesterol, Total 107 100 - 199 mg/dL   Triglycerides 409 (H) 0 - 149 mg/dL   HDL 28 (L) >81 mg/dL   VLDL Cholesterol Cal 31 5 - 40 mg/dL   LDL Chol Calc (NIH) 48 0 - 99 mg/dL   LDL/HDL Ratio 1.7 0.0 - 3.6 ratio    Comment:                                     LDL/HDL Ratio                                             Men  Women                               1/2 Avg.Risk  1.0    1.5                                   Avg.Risk  3.6    3.2                                2X Avg.Risk  6.2    5.0                                3X Avg.Risk  8.0    6.1     Radiology No results found.  Assessment/Plan  Carotid stenosis, right Duplex today shows a patent right carotid endarterectomy and stable 1 to 39% left ICA stenosis.  Status post endarterectomy about 6 years ago.  Continue current medical regimen.  Check annually.  Atherosclerosis of native arteries of the extremities with gangrene (HCC) Status post  left BKA with prosthesis and doing well.  Right ABI only mildly reduced.  Continue to follow this on an annual basis unless worrisome symptoms develop on the right side.  Essential hypertension blood pressure control important in reducing the progression of atherosclerotic disease. On appropriate oral medications.   Uncontrolled type 2 diabetes mellitus with hyperglycemia (HCC) blood glucose control important in reducing the progression of atherosclerotic disease. Also, involved in wound healing. On appropriate medications.    Festus Barren, MD  07/12/2023 9:57 AM    This note was created with Dragon medical transcription system.  Any errors from dictation are purely unintentional

## 2023-07-12 NOTE — Assessment & Plan Note (Signed)
blood glucose control important in reducing the progression of atherosclerotic disease. Also, involved in wound healing. On appropriate medications.

## 2023-07-13 LAB — VAS US ABI WITH/WO TBI: Right ABI: 0.84

## 2023-08-08 DIAGNOSIS — E113312 Type 2 diabetes mellitus with moderate nonproliferative diabetic retinopathy with macular edema, left eye: Secondary | ICD-10-CM | POA: Diagnosis not present

## 2023-08-08 DIAGNOSIS — E1142 Type 2 diabetes mellitus with diabetic polyneuropathy: Secondary | ICD-10-CM | POA: Diagnosis not present

## 2023-08-18 ENCOUNTER — Other Ambulatory Visit: Payer: Self-pay | Admitting: Physician Assistant

## 2023-08-18 DIAGNOSIS — M19012 Primary osteoarthritis, left shoulder: Secondary | ICD-10-CM

## 2023-08-18 DIAGNOSIS — E1165 Type 2 diabetes mellitus with hyperglycemia: Secondary | ICD-10-CM

## 2023-09-08 ENCOUNTER — Encounter: Payer: Self-pay | Admitting: Physician Assistant

## 2023-09-08 ENCOUNTER — Ambulatory Visit (INDEPENDENT_AMBULATORY_CARE_PROVIDER_SITE_OTHER): Payer: Medicare HMO | Admitting: Physician Assistant

## 2023-09-08 VITALS — BP 112/70 | HR 75 | Temp 98.3°F | Resp 16 | Ht 68.0 in | Wt 161.0 lb

## 2023-09-08 DIAGNOSIS — I10 Essential (primary) hypertension: Secondary | ICD-10-CM | POA: Diagnosis not present

## 2023-09-08 DIAGNOSIS — E1142 Type 2 diabetes mellitus with diabetic polyneuropathy: Secondary | ICD-10-CM

## 2023-09-08 DIAGNOSIS — E782 Mixed hyperlipidemia: Secondary | ICD-10-CM

## 2023-09-08 LAB — POCT GLYCOSYLATED HEMOGLOBIN (HGB A1C): Hemoglobin A1C: 6.9 % — AB (ref 4.0–5.6)

## 2023-09-08 NOTE — Progress Notes (Signed)
[facility] [address] [place], [place] [address]  Internal MEDICINE  Office Visit Note  Patient Name: [name]  [id number]  [id number]  Date of Service: 09/08/2023  Chief Complaint  Patient presents with   Follow-up   Diabetes   Gastroesophageal Reflux   Hypertension   Hyperlipidemia    HPI Pt is here for routine follow up -BG variable 90-170 typically, highest 200 (after thanksgiving) -taking glyburide BID -BP and HR stable -Wearing 2L at night -reviewed labs, needs microalbumin  Current Medication: Outpatient Encounter Medications as of 09/08/2023  Medication Sig   acetaminophen (TYLENOL) 325 MG tablet Take 650 mg by mouth every 6 (six) hours as needed for moderate pain.   aspirin EC 81 MG tablet Take 81 mg by mouth daily.   Blood Glucose Monitoring Suppl (TRUE METRIX AIR GLUCOSE METER) w/Device KIT USE AS DIRECTED TO CHECK GLUCOSE   budesonide-formoterol (SYMBICORT) 80-4.5 MCG/ACT inhaler Inhale 2 puffs into the lungs 2 (two) times daily.   Calcium Carbonate-Vitamin D (CALCIUM PLUS VITAMIN D PO) Take 1 tablet by mouth daily.   clopidogrel (PLAVIX) 75 MG tablet TAKE 1 TABLET EVERY DAY   cyclobenzaprine (FLEXERIL) 10 MG tablet TAKE 1 TABLET THREE TIMES DAILY AS NEEDED FOR MUSCLE SPASM(S)   gabapentin (NEURONTIN) 300 MG capsule TAKE 1 CAPSULE THREE TIMES DAILY   glyBURIDE (DIABETA) 5 MG tablet TAKE 1 TABLET TWICE DAILY WITH MEALS   lovastatin (MEVACOR) 10 MG tablet TAKE 1 TABLET AT BEDTIME   meloxicam (MOBIC) 15 MG tablet Take 15 mg by mouth daily.   metoprolol succinate (TOPROL-XL) 25 MG 24 hr tablet Take 1 tablet by mouth daily.   OXYGEN Inhale into the lungs. 2 litre at night   pantoprazole (PROTONIX) 40 MG tablet Take 1 tablet (40 mg total) by mouth daily.   traMADol (ULTRAM) 50 MG tablet TAKE 1 TABLET EVERY 12 HOURS AS NEEDED   TRUE METRIX BLOOD GLUCOSE TEST test strip TEST BLOOD SUGAR TWICE DAILY AS DIRECTED   TRUEplus Lancets 30G MISC by Does  not apply route. Use as directed twice a daily Dx E11.65   [DISCONTINUED] Continuous Blood Gluc Receiver (DEXCOM G6 RECEIVER) DEVI Use for continuous blood glucose monitoring DX E11.65   [DISCONTINUED] Continuous Blood Gluc Sensor (DEXCOM G6 SENSOR) MISC Use for Continuous blood glucose monitoring.  Replace every 10 days  DX E11.65   [DISCONTINUED] Continuous Blood Gluc Transmit (DEXCOM G6 TRANSMITTER) MISC Use as directed for continuous glucose monitoring DX E11.65   [DISCONTINUED] diphenhydrAMINE (BENADRYL) 25 mg capsule Take 25 mg by mouth every 6 (six) hours as needed for allergies.   No facility-administered encounter medications on file as of 09/08/2023.    Surgical History: Past Surgical History:  Procedure Laterality Date   AMPUTATION Left 04/23/2021   Procedure: AMPUTATION BELOW KNEE;  Surgeon: Annice Needy, MD;  Location: [facility];  Service: General;  Laterality: Left;   AMPUTATION TOE Left 02/25/2017   Procedure: AMPUTATION TOE-LEFT 2ND MPJ;  Surgeon: Gwyneth Revels, DPM;  Location: [facility];  Service: Podiatry;  Laterality: Left;   AMPUTATION TOE Left 10/20/2018   Procedure: TOE MPJ T2 LEFT;  Surgeon: Gwyneth Revels, DPM;  Location: [facility];  Service: Podiatry;  Laterality: Left;   AMPUTATION TOE Left 01/12/2019   Procedure: AMPUTATION LEFT 5TH TOE AND JOINT;  Surgeon: Gwyneth Revels, DPM;  Location: [facility];  Service: Podiatry;  Laterality: Left;   BACK SURGERY  2008   BONE BIOPSY Left 11/21/2020   Procedure: BONE  BIOPSY;  Surgeon: Gwyneth Revels, DPM;  Location: [facility];  Service: Podiatry;  Laterality: Left;   CHOLECYSTECTOMY     COLONOSCOPY WITH PROPOFOL N/A 08/15/2017   Procedure: COLONOSCOPY WITH PROPOFOL;  Surgeon: Christena Deem, MD;  Location: [facility];  Service: Endoscopy;  Laterality: N/A;   ENDARTERECTOMY Right 05/12/2017   Procedure: ENDARTERECTOMY CAROTID;  Surgeon: Annice Needy, MD;  Location: [facility];  Service: Vascular;  Laterality: Right;    ENDARTERECTOMY FEMORAL Left 12/01/2018   Procedure: ENDARTERECTOMY FEMORAL;  Surgeon: Annice Needy, MD;  Location: [facility];  Service: Vascular;  Laterality: Left;   ERCP     FOOT SURGERY Right    x 3   IRRIGATION AND DEBRIDEMENT FOOT Left 11/21/2020   Procedure: IRRIGATION AND DEBRIDEMENT FOOT--with placement of wound vac;  Surgeon: Gwyneth Revels, DPM;  Location: [facility];  Service: Podiatry;  Laterality: Left;   LOWER EXTREMITY ANGIOGRAM Left 12/01/2018   Procedure: LOWER EXTREMITY ANGIOGRAM;  Surgeon: Annice Needy, MD;  Location: [facility];  Service: Vascular;  Laterality: Left;   LOWER EXTREMITY ANGIOGRAPHY Left 10/30/2018   Procedure: LOWER EXTREMITY ANGIOGRAPHY;  Surgeon: Annice Needy, MD;  Location: [facility];  Service: Cardiovascular;  Laterality: Left;   LOWER EXTREMITY ANGIOGRAPHY Left 11/30/2018   Procedure: LOWER EXTREMITY ANGIOGRAPHY;  Surgeon: Annice Needy, MD;  Location: [facility];  Service: Cardiovascular;  Laterality: Left;   LOWER EXTREMITY ANGIOGRAPHY Left 11/17/2020   Procedure: Lower Extremity Angiography;  Surgeon: Annice Needy, MD;  Location: [facility];  Service: Cardiovascular;  Laterality: Left;   LOWER EXTREMITY ANGIOGRAPHY Left 04/22/2021   Procedure: Lower Extremity Angiography;  Surgeon: Annice Needy, MD;  Location: [facility];  Service: Cardiovascular;  Laterality: Left;   TEE WITHOUT CARDIOVERSION N/A 11/14/2020   Procedure: TRANSESOPHAGEAL ECHOCARDIOGRAM (TEE);  Surgeon: Lamar Blinks, MD;  Location: [facility];  Service: Cardiovascular;  Laterality: N/A;    Medical History: Past Medical History:  Diagnosis Date   Cholecystitis, chronic    Cholelithiasis    COPD (chronic obstructive pulmonary disease) (HCC)    Diabetes mellitus without complication (HCC)    Diabetic retinopathy (HCC)    Dyspnea    Fatty liver 2008   Fracture of clavicle 1992   left    Fracture of ribs, multiple 1992   3 ribs   GERD (gastroesophageal  reflux disease)    Hyperlipidemia    Hypertension    Paroxysmal SVT (supraventricular tachycardia) (HCC)    Pelvis fracture (HCC) 1992   Peripheral vascular disease (HCC)    Pleurisy    Pleurisy    Seasonal allergies     Family History: Family History  Problem Relation Age of Onset   Diabetes Sister     Social History   Socioeconomic History   Marital status: Single    Spouse name: Not on file   Number of children: Not on file   Years of education: Not on file   Highest education level: Not on file  Occupational History   Not on file  Tobacco Use   Smoking status: Former    Current packs/day: 0.00    Types: Cigarettes    Quit date: 02/24/1997    Years since quitting: 26.5   Smokeless tobacco: Never  Vaping Use   Vaping status: Never Used  Substance and Sexual Activity   Alcohol use: No   Drug use: No   Sexual activity: Not on file  Other  Topics Concern   Not on file  Social History Narrative   Not on file   Social Drivers of Health   Financial Resource Strain: Low Risk  (02/17/2021)   Overall Financial Resource Strain (CARDIA)    Difficulty of Paying Living Expenses: Not very hard  Food Insecurity: No Food Insecurity (08/17/2021)   Hunger Vital Sign    Worried About Running Out of Food in the Last Year: Never true    Ran Out of Food in the Last Year: Never true  Transportation Needs: No Transportation Needs (08/17/2021)   PRAPARE - Administrator, Civil Service (Medical): No    Lack of Transportation (Non-Medical): No  Physical Activity: Not on file  Stress: Not on file  Social Connections: Not on file  Intimate Partner Violence: Not on file      Review of Systems  Constitutional:  Negative for chills, fatigue and unexpected weight change.  HENT:  Negative for congestion, rhinorrhea, sneezing and sore throat.   Eyes:  Negative for redness.  Respiratory:  Negative for cough, chest tightness and shortness of breath.   Cardiovascular:   Negative for chest pain and palpitations.  Gastrointestinal:  Negative for abdominal pain, constipation, diarrhea, nausea and vomiting.  Genitourinary:  Negative for dysuria and frequency.  Musculoskeletal:  Positive for gait problem. Negative for arthralgias, back pain, joint swelling and neck pain.  Skin:  Negative for rash.  Neurological:  Negative for tremors and numbness.  Hematological:  Negative for adenopathy. Does not bruise/bleed easily.  Psychiatric/Behavioral:  Negative for behavioral problems (Depression), sleep disturbance and suicidal ideas. The patient is not nervous/anxious.     Vital Signs: BP 112/70   Pulse 75   Temp 98.3 F (36.8 C)   Resp 16   Ht 5\' 8"  (1.727 m)   Wt 161 lb (73 kg)   SpO2 99%   BMI 24.48 kg/m    Physical Exam Vitals and nursing note reviewed.  Constitutional:      General: He is not in acute distress.    Appearance: He is well-developed. He is not diaphoretic.  HENT:     Head: Normocephalic and atraumatic.     Mouth/Throat:     Pharynx: No oropharyngeal exudate.  Eyes:     Pupils: Pupils are equal, round, and reactive to light.  Neck:     Thyroid: No thyromegaly.     Vascular: No JVD.     Trachea: No tracheal deviation.  Cardiovascular:     Rate and Rhythm: Normal rate and regular rhythm.     Heart sounds: Normal heart sounds. No murmur heard.    No friction rub. No gallop.  Pulmonary:     Effort: Pulmonary effort is normal. No respiratory distress.     Breath sounds: No wheezing or rales.  Chest:     Chest wall: No tenderness.  Abdominal:     General: Bowel sounds are normal.     Palpations: Abdomen is soft.  Musculoskeletal:        General: Normal range of motion.     Cervical back: Normal range of motion and neck supple.     Comments: LLE BKA, wearing prosthetic, walking with cane  Lymphadenopathy:     Cervical: No cervical adenopathy.  Skin:    General: Skin is warm and dry.  Neurological:     Mental Status: He is  alert and oriented to person, place, and time.     Cranial Nerves: No cranial nerve deficit.  Gait: Gait abnormal.  Psychiatric:        Behavior: Behavior normal.        Thought Content: Thought content normal.        Judgment: Judgment normal.        Assessment/Plan: 1. Type 2 diabetes mellitus with diabetic polyneuropathy, without long-term current use of insulin (HCC) (Primary) - POCT HgB A1C is 6.9 which is improved from 7.2 last visit, continue current medication and monitoring - Urine Microalbumin w/creat. ratio  2. Essential hypertension Stable, continue current medications  3. Mixed hyperlipidemia Continue lovastatin as before   General Counseling: Laquon verbalizes understanding of the findings of todays visit and agrees with plan of treatment. I have discussed any further diagnostic evaluation that may be needed or ordered today. We also reviewed his medications today. he has been encouraged to call the office with any questions or concerns that should arise related to todays visit.    Orders Placed This Encounter  Procedures   Urine Microalbumin w/creat. ratio   POCT HgB A1C    No orders of the defined types were placed in this encounter.   This patient was seen by Lynn Ito, PA-C in collaboration with Dr. Beverely Risen as a part of collaborative care agreement.   Total time spent:30 Minutes Time spent includes review of chart, medications, test results, and follow up plan with the patient.      Dr Lyndon Code Internal medicine

## 2023-09-09 LAB — MICROALBUMIN / CREATININE URINE RATIO
Creatinine, Urine: 60.4 mg/dL
Microalb/Creat Ratio: 21 mg/g{creat} (ref 0–29)
Microalbumin, Urine: 12.4 ug/mL

## 2023-09-12 ENCOUNTER — Other Ambulatory Visit: Payer: Self-pay | Admitting: Physician Assistant

## 2023-09-12 ENCOUNTER — Other Ambulatory Visit: Payer: Self-pay | Admitting: Internal Medicine

## 2023-09-12 DIAGNOSIS — G8929 Other chronic pain: Secondary | ICD-10-CM

## 2023-09-19 DIAGNOSIS — E113312 Type 2 diabetes mellitus with moderate nonproliferative diabetic retinopathy with macular edema, left eye: Secondary | ICD-10-CM | POA: Diagnosis not present

## 2023-10-13 ENCOUNTER — Encounter: Payer: Self-pay | Admitting: Physician Assistant

## 2023-10-13 ENCOUNTER — Ambulatory Visit (INDEPENDENT_AMBULATORY_CARE_PROVIDER_SITE_OTHER): Payer: Medicare HMO | Admitting: Physician Assistant

## 2023-10-13 VITALS — BP 107/80 | HR 73 | Temp 98.3°F | Resp 16 | Ht 68.0 in | Wt 162.0 lb

## 2023-10-13 DIAGNOSIS — R21 Rash and other nonspecific skin eruption: Secondary | ICD-10-CM

## 2023-10-13 MED ORDER — PREDNISONE 10 MG PO TABS
ORAL_TABLET | ORAL | 0 refills | Status: DC
Start: 2023-10-13 — End: 2023-12-05

## 2023-10-13 NOTE — Progress Notes (Signed)
[facility] [address] [place], [place] [address]  Internal MEDICINE  Office Visit Note  Patient Name: [name]  [id number]  [id number]  Date of Service: 10/13/2023  Chief Complaint  Patient presents with   Acute Visit   Rash    Rash ongoing all over body for 3 weeks. Has been using Cortisone cream, not helping.     HPI Pt is here for a sick visit. -Rash for 3 weeks on lower half of body, both legs and buttocks. States he thought originally along left thigh from where prosthetic contacts, but then noticed it along other leg as well. Denies any on chest or upper back at this time -Denies any poison ivy contact or any tic/bug bites -Using calamine lotion and benadryl, thinks he used some cortisone too -tried a bath with epsom salt -just very itchy rash, otherwise feels fine -Has had some similar rash previously but not this bad, and this was a while ago -trying alternative detergents, has tried sensitive skin one before and is going to go back to this  Current Medication:  Outpatient Encounter Medications as of 10/13/2023  Medication Sig   acetaminophen (TYLENOL) 325 MG tablet Take 650 mg by mouth every 6 (six) hours as needed for moderate pain.   aspirin EC 81 MG tablet Take 81 mg by mouth daily.   Blood Glucose Monitoring Suppl (TRUE METRIX AIR GLUCOSE METER) w/Device KIT USE AS DIRECTED TO CHECK GLUCOSE   budesonide-formoterol (SYMBICORT) 80-4.5 MCG/ACT inhaler Inhale 2 puffs into the lungs 2 (two) times daily.   Calcium Carbonate-Vitamin D (CALCIUM PLUS VITAMIN D PO) Take 1 tablet by mouth daily.   clopidogrel (PLAVIX) 75 MG tablet TAKE 1 TABLET EVERY DAY   cyclobenzaprine (FLEXERIL) 10 MG tablet TAKE 1 TABLET THREE TIMES DAILY AS NEEDED FOR MUSCLE SPASM(S)   gabapentin (NEURONTIN) 300 MG capsule TAKE 1 CAPSULE THREE TIMES DAILY   glyBURIDE (DIABETA) 5 MG tablet TAKE 1 TABLET TWICE DAILY WITH MEALS   lovastatin (MEVACOR) 10 MG tablet TAKE 1 TABLET AT  BEDTIME   meloxicam (MOBIC) 15 MG tablet Take 15 mg by mouth daily.   metoprolol succinate (TOPROL-XL) 25 MG 24 hr tablet Take 1 tablet by mouth daily.   OXYGEN Inhale into the lungs. 2 litre at night   pantoprazole (PROTONIX) 40 MG tablet Take 1 tablet (40 mg total) by mouth daily.   predniSONE (DELTASONE) 10 MG tablet Use per dose pack   traMADol (ULTRAM) 50 MG tablet TAKE 1 TABLET EVERY 12 HOURS AS NEEDED   TRUE METRIX BLOOD GLUCOSE TEST test strip TEST BLOOD SUGAR TWICE DAILY AS DIRECTED   TRUEplus Lancets 30G MISC by Does not apply route. Use as directed twice a daily Dx E11.65   No facility-administered encounter medications on file as of 10/13/2023.      Medical History: Past Medical History:  Diagnosis Date   Cholecystitis, chronic    Cholelithiasis    COPD (chronic obstructive pulmonary disease) (HCC)    Diabetes mellitus without complication (HCC)    Diabetic retinopathy (HCC)    Dyspnea    Fatty liver 2008   Fracture of clavicle 1992   left    Fracture of ribs, multiple 1992   3 ribs   GERD (gastroesophageal reflux disease)    Hyperlipidemia    Hypertension    Paroxysmal SVT (supraventricular tachycardia) (HCC)    Pelvis fracture (HCC) 1992   Peripheral vascular disease (HCC)    Pleurisy    Pleurisy  Seasonal allergies      Vital Signs: BP 107/80   Pulse 73   Temp 98.3 F (36.8 C)   Resp 16   Ht 5\' 8"  (1.727 m)   Wt 162 lb (73.5 kg)   SpO2 97%   BMI 24.63 kg/m    Review of Systems  Constitutional:  Negative for fatigue and fever.  HENT:  Negative for congestion, mouth sores and postnasal drip.   Respiratory:  Negative for cough.   Cardiovascular:  Negative for chest pain.  Genitourinary:  Negative for flank pain.  Skin:  Positive for rash.  Psychiatric/Behavioral: Negative.      Physical Exam Vitals and nursing note reviewed.  Constitutional:      Appearance: Normal appearance.  HENT:     Head: Normocephalic and atraumatic.   Cardiovascular:     Rate and Rhythm: Normal rate and regular rhythm.  Pulmonary:     Effort: Pulmonary effort is normal.     Breath sounds: Normal breath sounds.  Skin:    Findings: Rash present.     Comments: Dermatitis along both legs  Neurological:     Mental Status: He is alert.  Psychiatric:        Behavior: Behavior normal.       Assessment/Plan: 1. Rash and nonspecific skin eruption (Primary) Given large area of likely allergic dermatitis, will send prednisone taper. Advised to monitor BG with use of steroid. May continue to use benadryl and topicals such as cortisone cream as needed. Call if not improving. - predniSONE (DELTASONE) 10 MG tablet; Use per dose pack  Dispense: 21 tablet; Refill: 0   General Counseling: [name] verbalizes understanding of the findings of todays visit and agrees with plan of treatment. I have discussed any further diagnostic evaluation that may be needed or ordered today. We also reviewed his medications today. he has been encouraged to call the office with any questions or concerns that should arise related to todays visit.    Counseling:    No orders of the defined types were placed in this encounter.   Meds ordered this encounter  Medications   predniSONE (DELTASONE) 10 MG tablet    Sig: Use per dose pack    Dispense:  21 tablet    Refill:  0    Time spent:25 Minutes

## 2023-10-24 ENCOUNTER — Telehealth: Payer: Self-pay | Admitting: Physician Assistant

## 2023-10-24 DIAGNOSIS — B351 Tinea unguium: Secondary | ICD-10-CM | POA: Diagnosis not present

## 2023-10-24 DIAGNOSIS — E1142 Type 2 diabetes mellitus with diabetic polyneuropathy: Secondary | ICD-10-CM | POA: Diagnosis not present

## 2023-10-28 ENCOUNTER — Other Ambulatory Visit: Payer: Self-pay | Admitting: Physician Assistant

## 2023-10-28 ENCOUNTER — Telehealth: Payer: Self-pay | Admitting: Physician Assistant

## 2023-10-28 DIAGNOSIS — R21 Rash and other nonspecific skin eruption: Secondary | ICD-10-CM

## 2023-10-28 MED ORDER — HYDROXYZINE PAMOATE 25 MG PO CAPS
25.0000 mg | ORAL_CAPSULE | Freq: Three times a day (TID) | ORAL | 1 refills | Status: AC | PRN
Start: 2023-10-28 — End: ?

## 2023-10-31 DIAGNOSIS — E113312 Type 2 diabetes mellitus with moderate nonproliferative diabetic retinopathy with macular edema, left eye: Secondary | ICD-10-CM | POA: Diagnosis not present

## 2023-11-01 NOTE — Telephone Encounter (Signed)
error 

## 2023-11-01 NOTE — Telephone Encounter (Signed)
 Error

## 2023-11-28 ENCOUNTER — Other Ambulatory Visit: Payer: Self-pay

## 2023-11-28 DIAGNOSIS — J449 Chronic obstructive pulmonary disease, unspecified: Secondary | ICD-10-CM

## 2023-11-28 MED ORDER — BUDESONIDE-FORMOTEROL FUMARATE 80-4.5 MCG/ACT IN AERO: 2.0000 | INHALATION_SPRAY | Freq: Two times a day (BID) | RESPIRATORY_TRACT | 3 refills | Status: DC

## 2023-12-02 ENCOUNTER — Other Ambulatory Visit: Payer: Self-pay

## 2023-12-02 DIAGNOSIS — J449 Chronic obstructive pulmonary disease, unspecified: Secondary | ICD-10-CM

## 2023-12-02 MED ORDER — BUDESONIDE-FORMOTEROL FUMARATE 80-4.5 MCG/ACT IN AERO: 2.0000 | INHALATION_SPRAY | Freq: Two times a day (BID) | RESPIRATORY_TRACT | 3 refills | Status: DC

## 2023-12-05 ENCOUNTER — Telehealth (INDEPENDENT_AMBULATORY_CARE_PROVIDER_SITE_OTHER): Admitting: Nurse Practitioner

## 2023-12-05 ENCOUNTER — Encounter: Payer: Self-pay | Admitting: Nurse Practitioner

## 2023-12-05 VITALS — Ht 68.0 in | Wt 158.0 lb

## 2023-12-05 DIAGNOSIS — J069 Acute upper respiratory infection, unspecified: Secondary | ICD-10-CM

## 2023-12-05 MED ORDER — PREDNISONE 10 MG (21) PO TBPK: ORAL_TABLET | ORAL | 0 refills | Status: DC

## 2023-12-05 MED ORDER — BENZONATATE 200 MG PO CAPS: 200.0000 mg | ORAL_CAPSULE | Freq: Two times a day (BID) | ORAL | 0 refills | Status: AC | PRN

## 2023-12-05 MED ORDER — AZITHROMYCIN 250 MG PO TABS: 250.0000 mg | ORAL_TABLET | Freq: Every day | ORAL | 0 refills | Status: AC

## 2023-12-05 NOTE — Progress Notes (Signed)
 [facility] [address] [place], [place] [address]  Internal MEDICINE  Telephone Visit  Patient Name: [name]  [id number]  [id number]  Date of Service: 12/05/2023  I connected with the patient at 1525 by telephone and verified the patients identity using two identifiers.   I discussed the limitations, risks, security and privacy concerns of performing an evaluation and management service by telephone and the availability of in person appointments. I also discussed with the patient that there may be a patient responsible charge related to the service.  The patient expressed understanding and agrees to proceed.    Chief Complaint  Patient presents with   Telephone Screen    Covid is negative   Telephone Assessment   Sinusitis   Cough     [name] presents for a telehealth virtual visit for symptoms of sinusitis. Onset of symptoms was about a week ago. Covid test was negative Reports cough, SOB, wheezing, and fatigue   Current Medication: Outpatient Encounter Medications as of 12/05/2023  Medication Sig   acetaminophen (TYLENOL) 325 MG tablet Take 650 mg by mouth every 6 (six) hours as needed for moderate pain.   aspirin EC 81 MG tablet Take 81 mg by mouth daily.   azithromycin (ZITHROMAX) 250 MG tablet Take 1 tablet (250 mg total) by mouth daily for 10 days.   benzonatate (TESSALON) 200 MG capsule Take 1 capsule (200 mg total) by mouth 2 (two) times daily as needed for cough.   Blood Glucose Monitoring Suppl (TRUE METRIX AIR GLUCOSE METER) w/Device KIT USE AS DIRECTED TO CHECK GLUCOSE   budesonide-formoterol (SYMBICORT) 80-4.5 MCG/ACT inhaler Inhale 2 puffs into the lungs 2 (two) times daily.   Calcium Carbonate-Vitamin D (CALCIUM PLUS VITAMIN D PO) Take 1 tablet by mouth daily.   clopidogrel (PLAVIX) 75 MG tablet TAKE 1 TABLET EVERY DAY   cyclobenzaprine (FLEXERIL) 10 MG tablet TAKE 1 TABLET THREE TIMES DAILY AS NEEDED FOR MUSCLE SPASM(S)   gabapentin  (NEURONTIN) 300 MG capsule TAKE 1 CAPSULE THREE TIMES DAILY   glyBURIDE (DIABETA) 5 MG tablet TAKE 1 TABLET TWICE DAILY WITH MEALS   hydrOXYzine (VISTARIL) 25 MG capsule Take 1 capsule (25 mg total) by mouth every 8 (eight) hours as needed.   lovastatin (MEVACOR) 10 MG tablet TAKE 1 TABLET AT BEDTIME   meloxicam (MOBIC) 15 MG tablet Take 15 mg by mouth daily.   metoprolol succinate (TOPROL-XL) 25 MG 24 hr tablet Take 1 tablet by mouth daily.   OXYGEN Inhale into the lungs. 2 litre at night   pantoprazole (PROTONIX) 40 MG tablet Take 1 tablet (40 mg total) by mouth daily.   predniSONE (STERAPRED UNI-PAK 21 TAB) 10 MG (21) TBPK tablet Use as directed for 6 days   traMADol (ULTRAM) 50 MG tablet TAKE 1 TABLET EVERY 12 HOURS AS NEEDED   TRUE METRIX BLOOD GLUCOSE TEST test strip TEST BLOOD SUGAR TWICE DAILY AS DIRECTED   TRUEplus Lancets 30G MISC by Does not apply route. Use as directed twice a daily Dx E11.65   [DISCONTINUED] predniSONE (DELTASONE) 10 MG tablet Use per dose pack   No facility-administered encounter medications on file as of 12/05/2023.    Surgical History: Past Surgical History:  Procedure Laterality Date   AMPUTATION Left 04/23/2021   Procedure: AMPUTATION BELOW KNEE;  Surgeon: Annice Needy, MD;  Location: [facility] ORS;  Service: General;  Laterality: Left;   AMPUTATION TOE Left 02/25/2017   Procedure: AMPUTATION TOE-LEFT 2ND MPJ;  Surgeon: Gwyneth Revels,  DPM;  Location: [facility];  Service: Podiatry;  Laterality: Left;   AMPUTATION TOE Left 10/20/2018   Procedure: TOE MPJ T2 LEFT;  Surgeon: Gwyneth Revels, DPM;  Location: [facility];  Service: Podiatry;  Laterality: Left;   AMPUTATION TOE Left 01/12/2019   Procedure: AMPUTATION LEFT 5TH TOE AND JOINT;  Surgeon: Gwyneth Revels, DPM;  Location: [facility];  Service: Podiatry;  Laterality: Left;   BACK SURGERY  2008   BONE BIOPSY Left 11/21/2020   Procedure: BONE BIOPSY;  Surgeon: Gwyneth Revels, DPM;  Location: [facility];  Service:  Podiatry;  Laterality: Left;   CHOLECYSTECTOMY     COLONOSCOPY WITH PROPOFOL N/A 08/15/2017   Procedure: COLONOSCOPY WITH PROPOFOL;  Surgeon: Christena Deem, MD;  Location: [facility] ENDOSCOPY;  Service: Endoscopy;  Laterality: N/A;   ENDARTERECTOMY Right 05/12/2017   Procedure: ENDARTERECTOMY CAROTID;  Surgeon: Annice Needy, MD;  Location: [facility];  Service: Vascular;  Laterality: Right;   ENDARTERECTOMY FEMORAL Left 12/01/2018   Procedure: ENDARTERECTOMY FEMORAL;  Surgeon: Annice Needy, MD;  Location: [facility];  Service: Vascular;  Laterality: Left;   ERCP     FOOT SURGERY Right    x 3   IRRIGATION AND DEBRIDEMENT FOOT Left 11/21/2020   Procedure: IRRIGATION AND DEBRIDEMENT FOOT--with placement of wound vac;  Surgeon: Gwyneth Revels, DPM;  Location: [facility];  Service: Podiatry;  Laterality: Left;   LOWER EXTREMITY ANGIOGRAM Left 12/01/2018   Procedure: LOWER EXTREMITY ANGIOGRAM;  Surgeon: Annice Needy, MD;  Location: [facility];  Service: Vascular;  Laterality: Left;   LOWER EXTREMITY ANGIOGRAPHY Left 10/30/2018   Procedure: LOWER EXTREMITY ANGIOGRAPHY;  Surgeon: Annice Needy, MD;  Location: [facility];  Service: Cardiovascular;  Laterality: Left;   LOWER EXTREMITY ANGIOGRAPHY Left 11/30/2018   Procedure: LOWER EXTREMITY ANGIOGRAPHY;  Surgeon: Annice Needy, MD;  Location: [facility];  Service: Cardiovascular;  Laterality: Left;   LOWER EXTREMITY ANGIOGRAPHY Left 11/17/2020   Procedure: Lower Extremity Angiography;  Surgeon: Annice Needy, MD;  Location: [facility];  Service: Cardiovascular;  Laterality: Left;   LOWER EXTREMITY ANGIOGRAPHY Left 04/22/2021   Procedure: Lower Extremity Angiography;  Surgeon: Annice Needy, MD;  Location: [facility];  Service: Cardiovascular;  Laterality: Left;   TEE WITHOUT CARDIOVERSION N/A 11/14/2020   Procedure: TRANSESOPHAGEAL ECHOCARDIOGRAM (TEE);  Surgeon: Lamar Blinks, MD;  Location: [facility];  Service: Cardiovascular;   Laterality: N/A;    Medical History: Past Medical History:  Diagnosis Date   Cholecystitis, chronic    Cholelithiasis    COPD (chronic obstructive pulmonary disease) (HCC)    Diabetes mellitus without complication (HCC)    Diabetic retinopathy (HCC)    Dyspnea    Fatty liver 2008   Fracture of clavicle 1992   left    Fracture of ribs, multiple 1992   3 ribs   GERD (gastroesophageal reflux disease)    Hyperlipidemia    Hypertension    Paroxysmal SVT (supraventricular tachycardia) (HCC)    Pelvis fracture (HCC) 1992   Peripheral vascular disease (HCC)    Pleurisy    Pleurisy    Seasonal allergies     Family History: Family History  Problem Relation Age of Onset   Diabetes Sister     Social History   Socioeconomic History   Marital status: Single    Spouse name: Not on file   Number of children: Not on file   Years of education: Not on file  Highest education level: Not on file  Occupational History   Not on file  Tobacco Use   Smoking status: Former    Current packs/day: 0.00    Types: Cigarettes    Quit date: 02/24/1997    Years since quitting: 26.7   Smokeless tobacco: Never  Vaping Use   Vaping status: Never Used  Substance and Sexual Activity   Alcohol use: No   Drug use: No   Sexual activity: Not on file  Other Topics Concern   Not on file  Social History Narrative   Not on file   Social Drivers of Health   Financial Resource Strain: Low Risk  (10/24/2023)   [facility]   Overall Financial Resource Strain (CARDIA)    Difficulty of Paying Living Expenses: Not very hard  Food Insecurity: No Food Insecurity (10/24/2023)   Received from [facility]   Hunger Vital Sign    Worried About Running Out of Food in the Last Year: Never true    Ran Out of Food in the Last Year: Never true  Transportation Needs: No Transportation Needs (10/24/2023)   Received from [facility] -  Transportation    In the past 12 months, has lack of transportation kept you from medical appointments or from getting medications?: No    Lack of Transportation (Non-Medical): No  Physical Activity: Not on file  Stress: Not on file  Social Connections: Not on file  Intimate Partner Violence: Not on file      Review of Systems  Constitutional:  Positive for fatigue.  HENT:  Negative for congestion, postnasal drip, rhinorrhea, sinus pressure, sinus pain, sneezing and sore throat.   Respiratory:  Positive for cough, shortness of breath and wheezing. Negative for chest tightness.   Cardiovascular: Negative.  Negative for chest pain and palpitations.  Neurological:  Negative for headaches.    Vital Signs: Ht 5\' 8"  (1.727 m)   Wt 158 lb (71.7 kg)   BMI 24.02 kg/m    Observation/Objective: He is alert and oriented. No acute distress noted. He has a wet hacking cough over audio.     Assessment/Plan: 1. Upper respiratory tract infection, unspecified type (Primary) 10 day zpak prescribed, take until gone, 6 days prednisone, take until gone. Cough medication take as needed.  - azithromycin (ZITHROMAX) 250 MG tablet; Take 1 tablet (250 mg total) by mouth daily for 10 days.  Dispense: 10 tablet; Refill: 0 - benzonatate (TESSALON) 200 MG capsule; Take 1 capsule (200 mg total) by mouth 2 (two) times daily as needed for cough.  Dispense: 30 capsule; Refill: 0 - predniSONE (STERAPRED UNI-PAK 21 TAB) 10 MG (21) TBPK tablet; Use as directed for 6 days  Dispense: 21 tablet; Refill: 0   General Counseling: Ashleigh verbalizes understanding of the findings of today's phone visit and agrees with plan of treatment. I have discussed any further diagnostic evaluation that may be needed or ordered today. We also reviewed his medications today. he has been encouraged to call the office with any questions or concerns that should arise related to todays visit.  Return if symptoms worsen or fail to  improve.   No orders of the defined types were placed in this encounter.   Meds ordered this encounter  Medications   azithromycin (ZITHROMAX) 250 MG tablet    Sig: Take 1 tablet (250 mg total) by mouth daily for 10 days.    Dispense:  10 tablet  Refill:  0   benzonatate (TESSALON) 200 MG capsule    Sig: Take 1 capsule (200 mg total) by mouth 2 (two) times daily as needed for cough.    Dispense:  30 capsule    Refill:  0    Fill new script today   predniSONE (STERAPRED UNI-PAK 21 TAB) 10 MG (21) TBPK tablet    Sig: Use as directed for 6 days    Dispense:  21 tablet    Refill:  0    Fill new script today    Time spent:10 Minutes Time spent with patient included reviewing progress notes, labs, imaging studies, and discussing plan for follow up.  [place] Controlled Substance Database was reviewed by me for overdose risk score (ORS) if appropriate.  This patient was seen by Sallyanne Kuster, FNP-C in collaboration with Dr. Beverely Risen as a part of collaborative care agreement.  Merisa Julio R. Tedd Sias, MSN, FNP-C Internal medicine

## 2023-12-09 DIAGNOSIS — E113312 Type 2 diabetes mellitus with moderate nonproliferative diabetic retinopathy with macular edema, left eye: Secondary | ICD-10-CM | POA: Diagnosis not present

## 2023-12-09 DIAGNOSIS — E113313 Type 2 diabetes mellitus with moderate nonproliferative diabetic retinopathy with macular edema, bilateral: Secondary | ICD-10-CM | POA: Diagnosis not present

## 2023-12-29 DIAGNOSIS — I428 Other cardiomyopathies: Secondary | ICD-10-CM | POA: Diagnosis not present

## 2023-12-29 DIAGNOSIS — I110 Hypertensive heart disease with heart failure: Secondary | ICD-10-CM | POA: Diagnosis not present

## 2023-12-29 DIAGNOSIS — I5032 Chronic diastolic (congestive) heart failure: Secondary | ICD-10-CM | POA: Diagnosis not present

## 2023-12-29 DIAGNOSIS — I471 Supraventricular tachycardia, unspecified: Secondary | ICD-10-CM | POA: Diagnosis not present

## 2023-12-29 DIAGNOSIS — E785 Hyperlipidemia, unspecified: Secondary | ICD-10-CM | POA: Diagnosis not present

## 2023-12-29 DIAGNOSIS — I10 Essential (primary) hypertension: Secondary | ICD-10-CM | POA: Diagnosis not present

## 2023-12-29 DIAGNOSIS — I6523 Occlusion and stenosis of bilateral carotid arteries: Secondary | ICD-10-CM | POA: Diagnosis not present

## 2023-12-29 DIAGNOSIS — E119 Type 2 diabetes mellitus without complications: Secondary | ICD-10-CM | POA: Diagnosis not present

## 2023-12-29 DIAGNOSIS — I447 Left bundle-branch block, unspecified: Secondary | ICD-10-CM | POA: Diagnosis not present

## 2024-01-09 ENCOUNTER — Ambulatory Visit: Payer: Self-pay

## 2024-01-09 ENCOUNTER — Other Ambulatory Visit: Payer: Self-pay

## 2024-01-09 ENCOUNTER — Encounter: Payer: Self-pay | Admitting: Physician Assistant

## 2024-01-09 ENCOUNTER — Ambulatory Visit: Payer: Medicare HMO | Admitting: Physician Assistant

## 2024-01-09 VITALS — BP 121/73 | HR 79 | Temp 97.8°F | Resp 16 | Ht 68.0 in | Wt 152.0 lb

## 2024-01-09 DIAGNOSIS — M19012 Primary osteoarthritis, left shoulder: Secondary | ICD-10-CM

## 2024-01-09 DIAGNOSIS — J449 Chronic obstructive pulmonary disease, unspecified: Secondary | ICD-10-CM

## 2024-01-09 DIAGNOSIS — M19011 Primary osteoarthritis, right shoulder: Secondary | ICD-10-CM

## 2024-01-09 DIAGNOSIS — I10 Essential (primary) hypertension: Secondary | ICD-10-CM | POA: Diagnosis not present

## 2024-01-09 DIAGNOSIS — Z5986 Financial insecurity: Secondary | ICD-10-CM

## 2024-01-09 DIAGNOSIS — E1142 Type 2 diabetes mellitus with diabetic polyneuropathy: Secondary | ICD-10-CM

## 2024-01-09 LAB — POCT GLYCOSYLATED HEMOGLOBIN (HGB A1C): Hemoglobin A1C: 6.6 % — AB (ref 4.0–5.6)

## 2024-01-09 MED ORDER — TRAMADOL HCL 50 MG PO TABS: ORAL_TABLET | ORAL | 2 refills | Status: DC

## 2024-01-09 NOTE — Progress Notes (Signed)
 [facility] [address] [place], [place] [address]  Internal MEDICINE  Office Visit Note  Patient Name: [name]  [id number]  [id number]  Date of Service: 01/25/2024  Chief Complaint  Patient presents with   Follow-up   Diabetes   Gastroesophageal Reflux   Hypertension   Hyperlipidemia   Medication Management    Requesting new inhaler that is more affordable.     HPI Pt is here for routine follow up -BG in AM under 100 now -Taking glyburide  BID -Symbicort  expensive, will look into assistance options/alternatives -wearing oxygen  at night only -taking tramadol  at night -went to cardiology last week, had EKG, will be having echo in the Fall -Having shots for macular degeneration -annual vascular visit in [place]  Current Medication: Outpatient Encounter Medications as of 01/09/2024  Medication Sig   acetaminophen  (TYLENOL ) 325 MG tablet Take 650 mg by mouth every 6 (six) hours as needed for moderate pain.   aspirin  EC 81 MG tablet Take 81 mg by mouth daily.   benzonatate  (TESSALON ) 200 MG capsule Take 1 capsule (200 mg total) by mouth 2 (two) times daily as needed for cough.   Blood Glucose Monitoring Suppl (TRUE METRIX AIR GLUCOSE METER) w/Device KIT USE AS DIRECTED TO CHECK GLUCOSE   budesonide -formoterol  (SYMBICORT ) 80-4.5 MCG/ACT inhaler Inhale 2 puffs into the lungs 2 (two) times daily.   Calcium  Carbonate-Vitamin D  (CALCIUM  PLUS VITAMIN D  PO) Take 1 tablet by mouth daily.   clopidogrel  (PLAVIX ) 75 MG tablet TAKE 1 TABLET EVERY DAY   cyclobenzaprine  (FLEXERIL ) 10 MG tablet TAKE 1 TABLET THREE TIMES DAILY AS NEEDED FOR MUSCLE SPASM(S)   gabapentin  (NEURONTIN ) 300 MG capsule TAKE 1 CAPSULE THREE TIMES DAILY   glyBURIDE  (DIABETA ) 5 MG tablet TAKE 1 TABLET TWICE DAILY WITH MEALS   hydrOXYzine  (VISTARIL ) 25 MG capsule Take 1 capsule (25 mg total) by mouth every 8 (eight) hours as needed.   lovastatin  (MEVACOR ) 10 MG tablet TAKE 1 TABLET AT BEDTIME   meloxicam  (MOBIC) 15 MG tablet Take 15 mg by mouth daily.   metoprolol  succinate (TOPROL -XL) 25 MG 24 hr tablet Take 1 tablet by mouth daily.   OXYGEN  Inhale into the lungs. 2 litre at night   pantoprazole  (PROTONIX ) 40 MG tablet Take 1 tablet (40 mg total) by mouth daily.   TRUE METRIX BLOOD GLUCOSE TEST test strip TEST BLOOD SUGAR TWICE DAILY AS DIRECTED   TRUEplus Lancets 30G MISC by Does not apply route. Use as directed twice a daily Dx E11.65   [DISCONTINUED] predniSONE  (STERAPRED UNI-PAK 21 TAB) 10 MG (21) TBPK tablet Use as directed for 6 days   [DISCONTINUED] traMADol  (ULTRAM ) 50 MG tablet TAKE 1 TABLET EVERY 12 HOURS AS NEEDED   traMADol  (ULTRAM ) 50 MG tablet TAKE 1 TABLET EVERY 12 HOURS AS NEEDED   No facility-administered encounter medications on file as of 01/09/2024.    Surgical History: Past Surgical History:  Procedure Laterality Date   AMPUTATION Left 04/23/2021   Procedure: AMPUTATION BELOW KNEE;  Surgeon: Celso College, MD;  Location: [facility] ORS;  Service: General;  Laterality: Left;   AMPUTATION TOE Left 02/25/2017   Procedure: AMPUTATION TOE-LEFT 2ND MPJ;  Surgeon: Anell Baptist, DPM;  Location: [facility];  Service: Podiatry;  Laterality: Left;   AMPUTATION TOE Left 10/20/2018   Procedure: TOE MPJ T2 LEFT;  Surgeon: Anell Baptist, DPM;  Location: [facility];  Service: Podiatry;  Laterality: Left;   AMPUTATION TOE Left 01/12/2019   Procedure: AMPUTATION LEFT  5TH TOE AND JOINT;  Surgeon: Anell Baptist, DPM;  Location: [facility];  Service: Podiatry;  Laterality: Left;   BACK SURGERY  2008   BONE BIOPSY Left 11/21/2020   Procedure: BONE BIOPSY;  Surgeon: Anell Baptist, DPM;  Location: [facility];  Service: Podiatry;  Laterality: Left;   CHOLECYSTECTOMY     COLONOSCOPY WITH PROPOFOL  N/A 08/15/2017   Procedure: COLONOSCOPY WITH PROPOFOL ;  Surgeon: Deveron Fly, MD;  [facility] ENDOSCOPY;  Service: Endoscopy;  Laterality: N/A;   ENDARTERECTOMY Right 05/12/2017   Procedure:  ENDARTERECTOMY CAROTID;  Surgeon: Celso College, MD;  Location: [facility];  Service: Vascular;  Laterality: Right;   ENDARTERECTOMY FEMORAL Left 12/01/2018   Procedure: ENDARTERECTOMY FEMORAL;  Surgeon: Celso College, MD;  Location: [facility];  Service: Vascular;  Laterality: Left;   ERCP     FOOT SURGERY Right    x 3   IRRIGATION AND DEBRIDEMENT FOOT Left 11/21/2020   Procedure: IRRIGATION AND DEBRIDEMENT FOOT--with placement of wound vac;  Surgeon: Anell Baptist, DPM;  Location: [facility];  Service: Podiatry;  Laterality: Left;   LOWER EXTREMITY ANGIOGRAM Left 12/01/2018   Procedure: LOWER EXTREMITY ANGIOGRAM;  Surgeon: Celso College, MD;  Location: [facility];  Service: Vascular;  Laterality: Left;   LOWER EXTREMITY ANGIOGRAPHY Left 10/30/2018   Procedure: LOWER EXTREMITY ANGIOGRAPHY;  Surgeon: Celso College, MD;  Location: [facility] IN[facility] LAB;  Service: Cardiovascular;  Laterality: Left;   LOWER EXTREMITY ANGIOGRAPHY Left 11/30/2018   Procedure: LOWER EXTREMITY ANGIOGRAPHY;  Surgeon: Celso College, MD;  Location: [facility];  Service: Cardiovascular;  Laterality: Left;   LOWER EXTREMITY ANGIOGRAPHY Left 11/17/2020   Procedure: Lower Extremity Angiography;  Surgeon: Celso College, MD;  Location: [facility] INV[facility] LAB;  Service: Cardiovascular;  Laterality: Left;   LOWER EXTREMITY ANGIOGRAPHY Left 04/22/2021   Procedure: Lower Extremity Angiography;  Surgeon: Celso College, MD;  Location: [facility] INV[facility] LAB;  Service: Cardiovascular;  Laterality: Left;   TEE WITHOUT CARDIOVERSION N/A 11/14/2020   Procedure: TRANSESOPHAGEAL ECHOCARDIOGRAM (TEE);  Surgeon: Michelle Aid, MD;  Location: [facility] OR[facility];  Service: Cardiovascular;  Laterality: N/A;    Medical History: Past Medical History:  Diagnosis Date   Cholecystitis, chronic    Cholelithiasis    COPD (chronic obstructive pulmonary disease) (HCC)    Diabetes mellitus without complication (HCC)    Diabetic retinopathy (HCC)    Dyspnea    Fatty liver  2008   Fracture of clavicle 1992   left    Fracture of ribs, multiple 1992   3 ribs   GERD (gastroesophageal reflux disease)    Hyperlipidemia    Hypertension    Paroxysmal SVT (supraventricular tachycardia) (HCC)    Pelvis fracture (HCC) 1992   Peripheral vascular disease (HCC)    Pleurisy    Pleurisy    Seasonal allergies     Family History: Family History  Problem Relation Age of Onset   Diabetes Sister     Social History   Socioeconomic History   Marital status: Single    Spouse name: Not on file   Number of children: Not on file   Years of education: Not on file   Highest education level: Not on file  Occupational History   Not on file  Tobacco Use   Smoking status: Former    Current packs/day: 0.00    Types: Cigarettes    Quit date: 02/24/1997    Years since quitting: 26.9   Smokeless  tobacco: Never  Vaping Use   Vaping status: Never Used  Substance and Sexual Activity   Alcohol  use: No   Drug use: No   Sexual activity: Not on file  Other Topics Concern   Not on file  Social History Narrative   Not on file   Social Drivers of Health   Financial Resource Strain: Low Risk  (10/24/2023)   Received from [facility]   Overall Financial Resource Strain (CARDIA)    Difficulty of Paying Living Expenses: Not very hard  Food Insecurity: No Food Insecurity (10/24/2023)   Received from [facility]   Hunger Vital Sign    Worried About Running Out of Food in the Last Year: Never true    Ran Out of Food in the Last Year: Never true  Transportation Needs: No Transportation Needs (10/24/2023)   Received from [facility]    In the past 12 months, has lack of transportation kept you from medical appointments or from getting medications?: No    Lack of Transportation (Non-Medical): No  Physical Activity: Not on file  Stress: Not on file  Social Connections: Not on file  Intimate Partner  Violence: Not on file      Review of Systems  Constitutional:  Negative for chills, fatigue and unexpected weight change.  HENT:  Negative for congestion, rhinorrhea, sneezing and sore throat.   Eyes:  Negative for redness.  Respiratory:  Negative for cough, chest tightness and shortness of breath.   Cardiovascular:  Negative for chest pain and palpitations.  Gastrointestinal:  Negative for abdominal pain, constipation, diarrhea, nausea and vomiting.  Genitourinary:  Negative for dysuria and frequency.  Musculoskeletal:  Positive for gait problem. Negative for arthralgias, back pain, joint swelling and neck pain.  Skin:  Negative for rash.  Neurological:  Negative for tremors and numbness.  Hematological:  Negative for adenopathy. Does not bruise/bleed easily.  Psychiatric/Behavioral:  Negative for behavioral problems (Depression), sleep disturbance and suicidal ideas. The patient is not nervous/anxious.     Vital Signs: BP 121/73   Pulse 79   Temp 97.8 F (36.6 C)   Resp 16   Ht 5\' 8"  (1.727 m)   Wt 152 lb (68.9 kg)   SpO2 95%   BMI 23.11 kg/m    Physical Exam Vitals and nursing note reviewed.  Constitutional:      General: He is not in acute distress.    Appearance: He is well-developed. He is not diaphoretic.  HENT:     Head: Normocephalic and atraumatic.     Mouth/Throat:     Pharynx: No oropharyngeal exudate.  Eyes:     Pupils: Pupils are equal, round, and reactive to light.  Neck:     Thyroid : No thyromegaly.     Vascular: No JVD.     Trachea: No tracheal deviation.  Cardiovascular:     Rate and Rhythm: Normal rate and regular rhythm.     Heart sounds: Normal heart sounds. No murmur heard.    No friction rub. No gallop.  Pulmonary:     Effort: Pulmonary effort is normal. No respiratory distress.     Breath sounds: No wheezing or rales.  Chest:     Chest wall: No tenderness.  Abdominal:     General: Bowel sounds are normal.     Palpations: Abdomen is  soft.  Musculoskeletal:        General: Normal range of motion.  Cervical back: Normal range of motion and neck supple.     Comments: LLE BKA, wearing prosthetic, walking with cane  Lymphadenopathy:     Cervical: No cervical adenopathy.  Skin:    General: Skin is warm and dry.  Neurological:     Mental Status: He is alert and oriented to person, place, and time.     Cranial Nerves: No cranial nerve deficit.     Gait: Gait abnormal.  Psychiatric:        Behavior: Behavior normal.        Thought Content: Thought content normal.        Judgment: Judgment normal.        Assessment/Plan: 1. Type 2 diabetes mellitus with diabetic polyneuropathy, without long-term current use of insulin  (HCC) (Primary) - POCT HgB A1C is 6.6 which is down from 6.9 last visit. Continue current medication  2. Primary osteoarthritis of shoulders, bilateral May use tramadol  as needed - traMADol  (ULTRAM ) 50 MG tablet; TAKE 1 TABLET EVERY 12 HOURS AS NEEDED  Dispense: 45 tablet; Refill: 2  3. Essential hypertension Stable, continue current medication  4. Chronic obstructive pulmonary disease, unspecified COPD type (HCC) Will look into cheaper alternatives and will speak with pharmacist about possible assistance options.    General Counseling: Teoman verbalizes understanding of the findings of todays visit and agrees with plan of treatment. I have discussed any further diagnostic evaluation that may be needed or ordered today. We also reviewed his medications today. he has been encouraged to call the office with any questions or concerns that should arise related to todays visit.    Orders Placed This Encounter  Procedures   POCT HgB A1C    Meds ordered this encounter  Medications   traMADol  (ULTRAM ) 50 MG tablet    Sig: TAKE 1 TABLET EVERY 12 HOURS AS NEEDED    Dispense:  45 tablet    Refill:  2    This patient was seen by Taylor Favia, PA-C in collaboration with Dr. Verneta Gone as a  part of collaborative care agreement.   Total time spent:30 Minutes Time spent includes review of chart, medications, test results, and follow up plan with the patient.      Dr Fozia M Khan Internal medicine

## 2024-01-09 NOTE — Progress Notes (Addendum)
   01/09/2024 Name: [name] MRN: [id number] DOB: 08-Jan-1951  Chief Complaint  Patient presents with   Medication Assistance    [name] is a 73 y.o. year old male who was referred for medication assistance by their primary care provider, McDonough, Lillie Reining, PA-C. They presented for a face to face visit today.   They were referred to the pharmacist by their PCP for assistance in managing medication access. [name] has a history of COPD.    Subjective:  Care Team: Primary Care Provider: Villa Greaser ; Next Scheduled Visit: 04/12/2024   Medication Access/Adherence  Current Pharmacy:  [facility] - [place], [place] - [address] [place] [address] Phone: [phone] Fax: [phone]  [facility] - [place], [place] - [address] [place] [address] Phone: [phone] Fax: [phone]  [id number] - [place], [place] - [address] AT [facility] [address] [place] [address] Phone: [phone] Fax: [phone]   Medication Assistance:    Objective:  Lab Results  Component Value Date   HGBA1C 6.6 (A) 01/09/2024    Lab Results  Component Value Date   CREATININE 1.17 06/27/2023   BUN 30 (H) 06/27/2023   NA 137 06/27/2023   K 5.2 06/27/2023   CL 96 06/27/2023   CO2 28 06/27/2023    Lab Results  Component Value Date   CHOL 107 06/27/2023   HDL 28 (L) 06/27/2023   LDLCALC 48 06/27/2023   TRIG 188 (H) 06/27/2023   CHOLHDL 3.9 06/20/2020      Assessment/Plan:   Medication Assistance:  I contacted [employer] to confirm whether their COPD program had been discontinued. It was verified that the program is no longer offered due to recent Medicare changes.  Additionally, it was confirmed that the patient has a $450 deductible and has currently met $154.69 of that amount. However, the patient is unable to afford the  associated out-of-pocket costs.  Given the potential for drug interactions between Trelegy and Breztri with metoprolol --which may contribute to QTc prolongation--this poses a clinical concern. According to chart review, the patient's most recent QTc was 449 ms (as of 12/28/2021, documented in Care Everywhere), which is at the higher end of the normal range. Since the patient's current QTc status is unknown, I will defer therapeutic assessment of the aforementioned alternative medications for PCP due to both clinical and financial considerations.  We discussed Breo as a potential option; however, the patient was unable to meet the out-of-pocket threshold required to qualify for the manufacturer's medication assistance program. [employer] are closed at this time.   Another alternative considered was Wixela. The patient does not qualify for their assistance program due to existing prescription drug coverage. However, with a GoodRx coupon, the cost would be approximately $48, though this amount would not apply toward the deductible. When discussing the copay of Symbicort , after meeting the deductible, he stated he could afford $47 for 30-days supply. Therefore, patient might consider the cost of Wixela with GoodRx to be acceptable.   Will collaborate with PCP to assess utility of Wixela as a cost alternative for Symbicort  as there isn't medication assistance available by [employer] manufacturer for Symbicort  medication.    Follow Up Plan: as needed  Alexandria Angel, PharmD Clinical Pharmacist Cell: [phone]

## 2024-01-25 ENCOUNTER — Other Ambulatory Visit: Payer: Self-pay | Admitting: Physician Assistant

## 2024-01-25 ENCOUNTER — Telehealth: Payer: Self-pay

## 2024-01-25 DIAGNOSIS — J449 Chronic obstructive pulmonary disease, unspecified: Secondary | ICD-10-CM

## 2024-01-25 MED ORDER — FLUTICASONE-SALMETEROL 250-50 MCG/ACT IN AEPB: 1.0000 | INHALATION_SPRAY | Freq: Two times a day (BID) | RESPIRATORY_TRACT | 3 refills | Status: AC

## 2024-01-25 NOTE — Telephone Encounter (Signed)
 Spoke with PT Wixela inhaler that the pharmacist mentioned was ~$48 with goodrx or if he wants to stick with symbicort  with unmet deductible as per pt he will call us  back

## 2024-02-03 ENCOUNTER — Other Ambulatory Visit: Payer: Self-pay

## 2024-02-03 DIAGNOSIS — Z5986 Financial insecurity: Secondary | ICD-10-CM

## 2024-02-03 DIAGNOSIS — Z79899 Other long term (current) drug therapy: Secondary | ICD-10-CM

## 2024-02-03 NOTE — Progress Notes (Signed)
   02/03/2024 Name: [name] MRN: [id number] DOB: 1951/04/19  Chief Complaint  Patient presents with   Medication Assistance    [name] is a 73 y.o. year old male who presented for a telephone visit.   They were referred to the pharmacist by their PCP for assistance in managing medication access.    Subjective:  Care Team: Primary Care Provider: Villa Greaser ; Next Scheduled Visit: 04/12/2024  Medication Access/Adherence  Current Pharmacy:  [facility] - [place], [place] - [address] [place] [address] Phone: [phone] Fax: [phone]  [facility] - [place], [place] - [address] [place] [address] Phone: [phone] Fax: [phone]  [id number] - [place], [place] - [address] AT [facility] [address] [place] [address] Phone: [phone] Fax: [phone]   Patient reports affordability concerns with their medications: Yes  Inhaler  Patient reports access/transportation concerns to their pharmacy: No  Patient reports adherence concerns with their medications:  Yes  Cost of inhaler    Medication Management:  Current adherence strategy: N/A  Patient reports Poor adherence to inhaler medication due to cost   Patient reports the following barriers to adherence: Cost of inhaler. Previous discussion, patient declined Medicare Payment Plan. Therefore, it was suggested to PCP to switch to [place] and use GoodRx and the OOP cost would be about $48. However, the prescription was sent to [facility] and the pharmacist stated it would cost [name] $105.10 for 30-das supply; as such he declined the inhaler.   I called and spoke with [name] today. We called [facility], with the patient on the phone, to transfer [facility] to [facility] on [address] [place], [place] (telephone [phone]) from [facility]. I asked if they would  honor GoodRx coupon due to [facility] even though patient has insurance and the pharmacist said that they would. I provided the GoodRx discount code and the cost will actually be $56.66 (as this is the standard coupon code); the cos of $48 was a special offer if the patient enrolled in a monthly premium to receive an additional discount. Patient reported that he is okay with the cost of the inhaler being $56.66    Of note, patient does NOT qualify for [employer] Extra Help.      Objective:  Lab Results  Component Value Date   HGBA1C 6.6 (A) 01/09/2024    Lab Results  Component Value Date   CREATININE 1.17 06/27/2023   BUN 30 (H) 06/27/2023   NA 137 06/27/2023   K 5.2 06/27/2023   CL 96 06/27/2023   CO2 28 06/27/2023    Lab Results  Component Value Date   CHOL 107 06/27/2023   HDL 28 (L) 06/27/2023   LDLCALC 48 06/27/2023   TRIG 188 (H) 06/27/2023   CHOLHDL 3.9 06/20/2020    Medications Reviewed Today   Medications were not reviewed in this encounter       Assessment/Plan:  Patient continue to have barriers to care related to management of chronic disease state.  Will update PCP on changes and f/u with patient (and/or pharmacy) in 1-2 weeks regarding cost  Alexandria Angel, PharmD Clinical Pharmacist Cell: [phone]

## 2024-02-06 ENCOUNTER — Other Ambulatory Visit (INDEPENDENT_AMBULATORY_CARE_PROVIDER_SITE_OTHER): Payer: Self-pay | Admitting: Nurse Practitioner

## 2024-02-06 DIAGNOSIS — E113312 Type 2 diabetes mellitus with moderate nonproliferative diabetic retinopathy with macular edema, left eye: Secondary | ICD-10-CM | POA: Diagnosis not present

## 2024-02-06 MED ORDER — CLOPIDOGREL BISULFATE 75 MG PO TABS: 75.0000 mg | ORAL_TABLET | Freq: Every day | ORAL | 3 refills | Status: AC

## 2024-02-08 ENCOUNTER — Telehealth: Payer: Self-pay

## 2024-02-08 DIAGNOSIS — Z79899 Other long term (current) drug therapy: Secondary | ICD-10-CM

## 2024-02-08 NOTE — Progress Notes (Signed)
   02/08/2024  Patient ID: [name], male   DOB: 1951-06-09, 73 y.o.   MRN: [id number]  I called to follow up with [name] regarding the cost of Wixela. Patient verified that [facility] and reports that is still affordable compared to spending >$100. Informed patient that if cost becomes an issue again for any of his medications to ask his PCP for a medication assistance referral.    Alexandria Angel, PharmD Clinical Pharmacist Cell: [phone]

## 2024-02-14 ENCOUNTER — Encounter (INDEPENDENT_AMBULATORY_CARE_PROVIDER_SITE_OTHER): Payer: Self-pay

## 2024-02-29 ENCOUNTER — Other Ambulatory Visit: Payer: Self-pay | Admitting: Physician Assistant

## 2024-04-10 DIAGNOSIS — Z961 Presence of intraocular lens: Secondary | ICD-10-CM | POA: Diagnosis not present

## 2024-04-10 DIAGNOSIS — H26492 Other secondary cataract, left eye: Secondary | ICD-10-CM | POA: Diagnosis not present

## 2024-04-10 DIAGNOSIS — E113312 Type 2 diabetes mellitus with moderate nonproliferative diabetic retinopathy with macular edema, left eye: Secondary | ICD-10-CM | POA: Diagnosis not present

## 2024-04-10 DIAGNOSIS — E113391 Type 2 diabetes mellitus with moderate nonproliferative diabetic retinopathy without macular edema, right eye: Secondary | ICD-10-CM | POA: Diagnosis not present

## 2024-04-12 ENCOUNTER — Telehealth: Payer: Self-pay | Admitting: Physician Assistant

## 2024-04-12 ENCOUNTER — Ambulatory Visit (INDEPENDENT_AMBULATORY_CARE_PROVIDER_SITE_OTHER): Admitting: Physician Assistant

## 2024-04-12 ENCOUNTER — Encounter: Payer: Self-pay | Admitting: Physician Assistant

## 2024-04-12 VITALS — BP 130/78 | HR 81 | Temp 97.8°F | Resp 16 | Ht 68.0 in | Wt 154.0 lb

## 2024-04-12 DIAGNOSIS — E1142 Type 2 diabetes mellitus with diabetic polyneuropathy: Secondary | ICD-10-CM | POA: Diagnosis not present

## 2024-04-12 DIAGNOSIS — S0990XA Unspecified injury of head, initial encounter: Secondary | ICD-10-CM

## 2024-04-12 DIAGNOSIS — S0011XA Contusion of right eyelid and periocular area, initial encounter: Secondary | ICD-10-CM | POA: Diagnosis not present

## 2024-04-12 DIAGNOSIS — E782 Mixed hyperlipidemia: Secondary | ICD-10-CM

## 2024-04-12 DIAGNOSIS — R946 Abnormal results of thyroid function studies: Secondary | ICD-10-CM

## 2024-04-12 DIAGNOSIS — R5383 Other fatigue: Secondary | ICD-10-CM

## 2024-04-12 LAB — POCT GLYCOSYLATED HEMOGLOBIN (HGB A1C): Hemoglobin A1C: 7.4 % — AB (ref 4.0–5.6)

## 2024-04-12 NOTE — Telephone Encounter (Signed)
Notified patient of CT appointment date, arrival time, location-Toni 

## 2024-04-12 NOTE — Progress Notes (Signed)
 [facility] [address] [place], [place] [address]  Internal MEDICINE  Office Visit Note  Patient Name: [name]  [id number]  [id number]  Date of Service: 04/12/2024  Chief Complaint  Patient presents with   Follow-up   Diabetes   Gastroesophageal Reflux    HPI Pt is here for routine follow up -Black eye on right eye, first noticed by family member on Thursday last week. States he woke up on the floor the night before and thinks he may have fallen out of bed as he has done this before. He now has a hospital bed with rails he got from a family member so he can't fall out of bed -acting out dreams, has had this for a very long time -did go to the eye doctor on the 15th for regular visit and they did check eye and said it was ok -he did not seek any care, and doesn't actually recall the incident or hitting his head. Unclear if LOC or circumstances of fall.  -No headaches or dizziness or vision changes. Did discuss checking CT out of precaution and to go to ED if any symptoms arise and he is agreeable -he is on plavix  and ASA -BG fluctuating more depending on if he goes to eat at someone's home and will work on this  Current Medication: Outpatient Encounter Medications as of 04/12/2024  Medication Sig   acetaminophen  (TYLENOL ) 325 MG tablet Take 650 mg by mouth every 6 (six) hours as needed for moderate pain.   aspirin  EC 81 MG tablet Take 81 mg by mouth daily.   benzonatate  (TESSALON ) 200 MG capsule Take 1 capsule (200 mg total) by mouth 2 (two) times daily as needed for cough.   Blood Glucose Monitoring Suppl (TRUE METRIX AIR GLUCOSE METER) w/Device KIT USE AS DIRECTED TO CHECK GLUCOSE   Calcium  Carbonate-Vitamin D  (CALCIUM  PLUS VITAMIN D  PO) Take 1 tablet by mouth daily.   clopidogrel  (PLAVIX ) 75 MG tablet Take 1 tablet (75 mg total) by mouth daily.   cyclobenzaprine  (FLEXERIL ) 10 MG tablet TAKE 1 TABLET THREE TIMES DAILY AS NEEDED FOR MUSCLE SPASM(S)    fluticasone -salmeterol (WIXELA INHUB) 250-50 MCG/ACT AEPB Inhale 1 puff into the lungs in the morning and at bedtime.   gabapentin  (NEURONTIN ) 300 MG capsule TAKE 1 CAPSULE THREE TIMES DAILY   glyBURIDE  (DIABETA ) 5 MG tablet TAKE 1 TABLET TWICE DAILY WITH MEALS   hydrOXYzine  (VISTARIL ) 25 MG capsule Take 1 capsule (25 mg total) by mouth every 8 (eight) hours as needed.   lovastatin  (MEVACOR ) 10 MG tablet TAKE 1 TABLET AT BEDTIME   meloxicam (MOBIC) 15 MG tablet Take 15 mg by mouth daily.   metoprolol  succinate (TOPROL -XL) 25 MG 24 hr tablet Take 1 tablet by mouth daily.   OXYGEN  Inhale into the lungs. 2 litre at night   pantoprazole  (PROTONIX ) 40 MG tablet Take 1 tablet (40 mg total) by mouth daily.   traMADol  (ULTRAM ) 50 MG tablet TAKE 1 TABLET EVERY 12 HOURS AS NEEDED   TRUE METRIX BLOOD GLUCOSE TEST test strip TEST BLOOD SUGAR TWICE DAILY AS DIRECTED   TRUEplus Lancets 30G MISC by Does not apply route. Use as directed twice a daily Dx E11.65   No facility-administered encounter medications on file as of 04/12/2024.    Surgical History: Past Surgical History:  Procedure Laterality Date   AMPUTATION Left 04/23/2021   Procedure: AMPUTATION BELOW KNEE;  Surgeon: Marea Selinda RAMAN, MD;  Location: [facility];  Service: General;  Laterality: Left;   AMPUTATION TOE Left 02/25/2017   Procedure: AMPUTATION TOE-LEFT 2ND MPJ;  Surgeon: Ashley Soulier, DPM;  Location: [facility];  Service: Podiatry;  Laterality: Left;   AMPUTATION TOE Left 10/20/2018   Procedure: TOE MPJ T2 LEFT;  Surgeon: Ashley Soulier, DPM;  Location: [facility];  Service: Podiatry;  Laterality: Left;   AMPUTATION TOE Left 01/12/2019   Procedure: AMPUTATION LEFT 5TH TOE AND JOINT;  Surgeon: Ashley Soulier, DPM;  Location: [facility];  Service: Podiatry;  Laterality: Left;   BACK SURGERY  2008   BONE BIOPSY Left 11/21/2020   Procedure: BONE BIOPSY;  Surgeon: Ashley Soulier, DPM;  Location: [facility];  Service: Podiatry;  Laterality: Left;    CHOLECYSTECTOMY     COLONOSCOPY WITH PROPOFOL  N/A 08/15/2017   Procedure: COLONOSCOPY WITH PROPOFOL ;  Surgeon: Gaylyn Gladis PENNER, MD;  Location[facility];  Service: Endoscopy;  Laterality: N/A;   ENDARTERECTOMY Right 05/12/2017   Procedure: ENDARTERECTOMY CAROTID;  Surgeon: Marea Selinda RAMAN, MD;  Location: [facility];  Service: Vascular;  Laterality: Right;   ENDARTERECTOMY FEMORAL Left 12/01/2018   Procedure: ENDARTERECTOMY FEMORAL;  Surgeon: Marea Selinda RAMAN, MD;  Location: [facility];  Service: Vascular;  Laterality: Left;   ERCP     FOOT SURGERY Right    x 3   IRRIGATION AND DEBRIDEMENT FOOT Left 11/21/2020   Procedure: IRRIGATION AND DEBRIDEMENT FOOT--with placement of wound vac;  Surgeon: Ashley Soulier, DPM;  Location: [facility];  Service: Podiatry;  Laterality: Left;   LOWER EXTREMITY ANGIOGRAM Left 12/01/2018   Procedure: LOWER EXTREMITY ANGIOGRAM;  Surgeon: Marea Selinda RAMAN, MD;  Location: [facility];  Service: Vascular;  Laterality: Left;   LOWER EXTREMITY ANGIOGRAPHY Left 10/30/2018   Procedure: LOWER EXTREMITY ANGIOGRAPHY;  Surgeon: Marea Selinda RAMAN, MD;  Location: [facility];  Service: Cardiovascular;  Laterality: Left;   LOWER EXTREMITY ANGIOGRAPHY Left 11/30/2018   Procedure: LOWER EXTREMITY ANGIOGRAPHY;  Surgeon: Marea Selinda RAMAN, MD;  Location: [facility];  Service: Cardiovascular;  Laterality: Left;   LOWER EXTREMITY ANGIOGRAPHY Left 11/17/2020   Procedure: Lower Extremity Angiography;  Surgeon: Marea Selinda RAMAN, MD;  Location: [facility];  Service: Cardiovascular;  Laterality: Left;   LOWER EXTREMITY ANGIOGRAPHY Left 04/22/2021   Procedure: Lower Extremity Angiography;  Surgeon: Marea Selinda RAMAN, MD;  Location: [facility];  Service: Cardiovascular;  Laterality: Left;   TEE WITHOUT CARDIOVERSION N/A 11/14/2020   Procedure: TRANSESOPHAGEAL ECHOCARDIOGRAM (TEE);  Surgeon: Hester Wolm PARAS, MD;  Location: [facility];  Service: Cardiovascular;  Laterality: N/A;    Medical  History: Past Medical History:  Diagnosis Date   Cholecystitis, chronic    Cholelithiasis    COPD (chronic obstructive pulmonary disease) (HCC)    Diabetes mellitus without complication (HCC)    Diabetic retinopathy (HCC)    Dyspnea    Fatty liver 2008   Fracture of clavicle 1992   left    Fracture of ribs, multiple 1992   3 ribs   GERD (gastroesophageal reflux disease)    Hyperlipidemia    Hypertension    Paroxysmal SVT (supraventricular tachycardia) (HCC)    Pelvis fracture (HCC) 1992   Peripheral vascular disease (HCC)    Pleurisy    Pleurisy    Seasonal allergies     Family History: Family History  Problem Relation Age of Onset   Diabetes Sister     Social History   Socioeconomic History   Marital status: Single    Spouse name: Not  on file   Number of children: Not on file   Years of education: Not on file   Highest education level: Not on file  Occupational History   Not on file  Tobacco Use   Smoking status: Former    Current packs/day: 0.00    Types: Cigarettes    Quit date: 02/24/1997    Years since quitting: 27.1   Smokeless tobacco: Never  Vaping Use   Vaping status: Never Used  Substance and Sexual Activity   Alcohol  use: No   Drug use: No   Sexual activity: Not on file  Other Topics Concern   Not on file  Social History Narrative   Not on file   Social Drivers of Health   Financial Resource Strain: Low Risk  (10/24/2023)   Received from [facility]   Overall Financial Resource Strain (CARDIA)    Difficulty of Paying Living Expenses: Not very hard  Food Insecurity: No Food Insecurity (10/24/2023)   Received from [facility]   Hunger Vital Sign    Within the past 12 months, you worried that your food would run out before you got the money to buy more.: Never true    Within the past 12 months, the food you bought just didn't last and you didn't have money to get more.: Never true  Transportation Needs: No  Transportation Needs (10/24/2023)   Received from [facility]    In the past 12 months, has lack of transportation kept you from medical appointments or from getting medications?: No    Lack of Transportation (Non-Medical): No  Physical Activity: Not on file  Stress: Not on file  Social Connections: Not on file  Intimate Partner Violence: Not on file      Review of Systems  Constitutional:  Negative for chills, fatigue and unexpected weight change.  HENT:  Negative for congestion, rhinorrhea, sneezing and sore throat.   Eyes:  Negative for redness.  Respiratory:  Negative for cough, chest tightness and shortness of breath.   Cardiovascular:  Negative for chest pain and palpitations.  Gastrointestinal:  Negative for abdominal pain, constipation, diarrhea, nausea and vomiting.  Genitourinary:  Negative for dysuria and frequency.  Musculoskeletal:  Positive for gait problem. Negative for arthralgias, back pain, joint swelling and neck pain.  Skin:  Positive for color change. Negative for rash.       Right black eye  Neurological:  Negative for dizziness, tremors, numbness and headaches.  Hematological:  Negative for adenopathy.  Psychiatric/Behavioral:  Negative for behavioral problems (Depression), sleep disturbance and suicidal ideas. The patient is not nervous/anxious.     Vital Signs: BP 130/78   Pulse 81   Temp 97.8 F (36.6 C)   Resp 16   Ht 5' 8 (1.727 m)   Wt 154 lb (69.9 kg)   SpO2 96%   BMI 23.42 kg/m    Physical Exam Vitals and nursing note reviewed.  Constitutional:      General: He is not in acute distress.    Appearance: He is well-developed. He is not diaphoretic.  HENT:     Head: Normocephalic and atraumatic.     Mouth/Throat:     Pharynx: No oropharyngeal exudate.  Eyes:     Pupils: Pupils are equal, round, and reactive to light.  Neck:     Thyroid : No thyromegaly.     Vascular: No JVD.     Trachea: No  tracheal deviation.  Cardiovascular:     Rate and Rhythm: Normal rate and regular rhythm.     Heart sounds: Normal heart sounds. No murmur heard.    No friction rub. No gallop.  Pulmonary:     Effort: Pulmonary effort is normal. No respiratory distress.     Breath sounds: No wheezing or rales.  Chest:     Chest wall: No tenderness.  Abdominal:     General: Bowel sounds are normal.     Palpations: Abdomen is soft.  Musculoskeletal:        General: Normal range of motion.     Cervical back: Normal range of motion and neck supple.     Comments: LLE BKA, wearing prosthetic, walking with cane  Lymphadenopathy:     Cervical: No cervical adenopathy.  Skin:    General: Skin is warm and dry.     Findings: Bruising present.     Comments: Bruising around right eye in stages of healing, greenish in color now  Neurological:     Mental Status: He is alert and oriented to person, place, and time.     Cranial Nerves: No cranial nerve deficit.     Gait: Gait abnormal.  Psychiatric:        Behavior: Behavior normal.        Thought Content: Thought content normal.        Judgment: Judgment normal.        Assessment/Plan: 1. Type 2 diabetes mellitus with diabetic polyneuropathy, without long-term current use of insulin  (HCC) (Primary) - POCT HgB A1C is 7.4 which is up from 6.6 last visit. Discussed need for improvement in diet and will monitor  2. Traumatic injury of head, initial encounter Will order CT head, advised to go to ED if any symptoms arise - CT HEAD WO CONTRAST ( ); Future  3. Black eye of right side, initial encounter Eye itself checked by eye doctor already and surrounding bruising is improving, will check CT due to traumatic injury and unclear timeline of events - CT HEAD WO CONTRAST ( ); Future  4. Abnormal thyroid  exam - TSH + free T4  5. Mixed hyperlipidemia Continue lovastatin  and will check  labs - Lipid Panel With LDL/HDL Ratio  6. Other fatigue - CBC  w/Diff/Platelet - Comprehensive metabolic panel with GFR - TSH + free T4 - Lipid Panel With LDL/HDL Ratio   General Counseling: Selwyn verbalizes understanding of the findings of todays visit and agrees with plan of treatment. I have discussed any further diagnostic evaluation that may be needed or ordered today. We also reviewed his medications today. he has been encouraged to call the office with any questions or concerns that should arise related to todays visit.    Orders Placed This Encounter  Procedures   CT HEAD WO CONTRAST ( )   CBC w/Diff/Platelet   Comprehensive metabolic panel with GFR   TSH + free T4   Lipid Panel With LDL/HDL Ratio   POCT HgB A1C    No orders of the defined types were placed in this encounter.   This patient was seen by Tinnie Pro, PA-C in collaboration with Dr. Sigrid Bathe as a part of collaborative care agreement.   Total time spent:30 Minutes Time spent includes review of chart, medications, test results, and follow up plan with the patient.      Dr Fozia M Khan Internal medicine

## 2024-04-17 ENCOUNTER — Ambulatory Visit
Admission: RE | Admit: 2024-04-17 | Discharge: 2024-04-17 | Disposition: A | Discharge status: Home / Self Care | Source: Ambulatory Visit | Attending: Physician Assistant | Admitting: Physician Assistant

## 2024-04-17 DIAGNOSIS — S0990XA Unspecified injury of head, initial encounter: Secondary | ICD-10-CM | POA: Diagnosis not present

## 2024-04-17 DIAGNOSIS — S0011XA Contusion of right eyelid and periocular area, initial encounter: Secondary | ICD-10-CM | POA: Diagnosis not present

## 2024-04-17 DIAGNOSIS — I672 Cerebral atherosclerosis: Secondary | ICD-10-CM | POA: Diagnosis not present

## 2024-04-26 ENCOUNTER — Telehealth: Payer: Self-pay

## 2024-04-27 NOTE — Telephone Encounter (Signed)
 Pt daughter advised that CT scan is negative

## 2024-04-30 DIAGNOSIS — I739 Peripheral vascular disease, unspecified: Secondary | ICD-10-CM | POA: Diagnosis not present

## 2024-04-30 DIAGNOSIS — E1142 Type 2 diabetes mellitus with diabetic polyneuropathy: Secondary | ICD-10-CM | POA: Diagnosis not present

## 2024-04-30 DIAGNOSIS — Z89512 Acquired absence of left leg below knee: Secondary | ICD-10-CM | POA: Diagnosis not present

## 2024-04-30 DIAGNOSIS — B351 Tinea unguium: Secondary | ICD-10-CM | POA: Diagnosis not present

## 2024-05-03 ENCOUNTER — Other Ambulatory Visit: Payer: Self-pay | Admitting: Physician Assistant

## 2024-05-03 DIAGNOSIS — E119 Type 2 diabetes mellitus without complications: Secondary | ICD-10-CM | POA: Diagnosis not present

## 2024-05-03 DIAGNOSIS — M19011 Primary osteoarthritis, right shoulder: Secondary | ICD-10-CM

## 2024-05-03 NOTE — Telephone Encounter (Signed)
 Please review and send

## 2024-05-30 DIAGNOSIS — I447 Left bundle-branch block, unspecified: Secondary | ICD-10-CM | POA: Diagnosis not present

## 2024-05-30 DIAGNOSIS — I428 Other cardiomyopathies: Secondary | ICD-10-CM | POA: Diagnosis not present

## 2024-06-12 DIAGNOSIS — E113312 Type 2 diabetes mellitus with moderate nonproliferative diabetic retinopathy with macular edema, left eye: Secondary | ICD-10-CM | POA: Diagnosis not present

## 2024-06-18 ENCOUNTER — Other Ambulatory Visit: Payer: Self-pay | Admitting: Physician Assistant

## 2024-06-18 ENCOUNTER — Other Ambulatory Visit: Payer: Self-pay | Admitting: Internal Medicine

## 2024-06-18 DIAGNOSIS — E1165 Type 2 diabetes mellitus with hyperglycemia: Secondary | ICD-10-CM

## 2024-06-21 DIAGNOSIS — Z89512 Acquired absence of left leg below knee: Secondary | ICD-10-CM | POA: Diagnosis not present

## 2024-06-21 DIAGNOSIS — E1142 Type 2 diabetes mellitus with diabetic polyneuropathy: Secondary | ICD-10-CM | POA: Diagnosis not present

## 2024-06-21 DIAGNOSIS — S91311A Laceration without foreign body, right foot, initial encounter: Secondary | ICD-10-CM | POA: Diagnosis not present

## 2024-06-22 DIAGNOSIS — E119 Type 2 diabetes mellitus without complications: Secondary | ICD-10-CM | POA: Diagnosis not present

## 2024-06-25 DIAGNOSIS — J449 Chronic obstructive pulmonary disease, unspecified: Secondary | ICD-10-CM | POA: Diagnosis not present

## 2024-06-25 DIAGNOSIS — I428 Other cardiomyopathies: Secondary | ICD-10-CM | POA: Diagnosis not present

## 2024-06-25 DIAGNOSIS — I471 Supraventricular tachycardia, unspecified: Secondary | ICD-10-CM | POA: Diagnosis not present

## 2024-06-25 DIAGNOSIS — Z23 Encounter for immunization: Secondary | ICD-10-CM | POA: Diagnosis not present

## 2024-06-25 DIAGNOSIS — I447 Left bundle-branch block, unspecified: Secondary | ICD-10-CM | POA: Diagnosis not present

## 2024-06-25 DIAGNOSIS — E785 Hyperlipidemia, unspecified: Secondary | ICD-10-CM | POA: Diagnosis not present

## 2024-06-25 DIAGNOSIS — I10 Essential (primary) hypertension: Secondary | ICD-10-CM | POA: Diagnosis not present

## 2024-06-29 NOTE — Progress Notes (Signed)
 [name]                                          MRN: [id number]   06/29/2024   The [facility] Quality Team Specialist reviewed this patient medical record for the purposes of chart review for care gap closure. The following were reviewed: chart review for care gap closure-kidney health evaluation for diabetes:eGFR  and uACR.    [facility] Quality Team

## 2024-07-02 ENCOUNTER — Telehealth: Payer: Self-pay | Admitting: Physician Assistant

## 2024-07-02 NOTE — Telephone Encounter (Signed)
 No answer, no vm, sent mychart message to confirm 07/09/24 appointment-Toni

## 2024-07-09 ENCOUNTER — Ambulatory Visit: Payer: Medicare HMO | Admitting: Physician Assistant

## 2024-07-09 VITALS — BP 128/70 | HR 89 | Temp 97.8°F | Resp 16 | Ht 70.0 in | Wt 156.0 lb

## 2024-07-09 DIAGNOSIS — R5383 Other fatigue: Secondary | ICD-10-CM | POA: Diagnosis not present

## 2024-07-09 DIAGNOSIS — Z0000 Encounter for general adult medical examination without abnormal findings: Secondary | ICD-10-CM

## 2024-07-09 DIAGNOSIS — E782 Mixed hyperlipidemia: Secondary | ICD-10-CM

## 2024-07-09 DIAGNOSIS — J432 Centrilobular emphysema: Secondary | ICD-10-CM | POA: Diagnosis not present

## 2024-07-09 DIAGNOSIS — R946 Abnormal results of thyroid function studies: Secondary | ICD-10-CM

## 2024-07-09 DIAGNOSIS — J069 Acute upper respiratory infection, unspecified: Secondary | ICD-10-CM

## 2024-07-09 DIAGNOSIS — E1142 Type 2 diabetes mellitus with diabetic polyneuropathy: Secondary | ICD-10-CM

## 2024-07-09 DIAGNOSIS — Z89512 Acquired absence of left leg below knee: Secondary | ICD-10-CM

## 2024-07-09 MED ORDER — AZITHROMYCIN 250 MG PO TABS: ORAL_TABLET | ORAL | 0 refills | Status: AC

## 2024-07-09 NOTE — Progress Notes (Addendum)
 [facility] [address] [place], [place] [address]  Internal MEDICINE  Office Visit Note  Patient Name: [name]  [id number]  [id number]  Date of Service: 07/09/2024  Chief Complaint  Patient presents with   Medicare Wellness    HPI [name] presents for an annual well visit  Well-appearing 73 y.o. male Routine CRC screening: UTD Eye exam and/or foot exam: followed by podiatry, eye exam UTD Labs: ordered, Acr done with [facility] -BG 64-162, avg low 100s -BP 110-120/54-60 -HR 70-80s -O2 at home 95% -Saw cardiology, they do not want to lower metoprolol  further despite lower HR as he is doing so well. Echo was good -vascular appt tomorrow -up to date on diabetic eye screening -cough started 3 weeks ago, taking cough med for the last week      07/09/2024    8:43 AM 06/28/2022    8:58 AM 06/22/2021    9:03 AM  MMSE - Mini Mental State Exam  Orientation to time 5 5 5   Orientation to Place 5 5 5   Registration 3 3 3   Attention/ Calculation 5 5 5   Recall 3 3 3   Language- name 2 objects 2 2 2   Language- repeat 1 1 1   Language- follow 3 step command 3 3 3   Language- read & follow direction 1 1 1   Write a sentence 1 1 1   Copy design 1 1 1   Total score 30 30 30     Functional Status Survey: Is the patient deaf or have difficulty hearing?: No Does the patient have difficulty seeing, even when wearing glasses/contacts?: No Does the patient have difficulty concentrating, remembering, or making decisions?: No Does the patient have difficulty walking or climbing stairs?: No Does the patient have difficulty dressing or bathing?: No Does the patient have difficulty doing errands alone such as visiting a doctor's office or shopping?: Yes     07/04/2023    8:53 AM 09/08/2023    9:18 AM 01/09/2024    9:01 AM 04/12/2024    8:26 AM 07/09/2024    8:41 AM  Fall Risk  Falls in the past year? 1 0 0 1 1  Was there an injury with Fall? 0   1 1  Fall Risk  Category Calculator 2   3 2   Patient at Risk for Falls Due to    History of fall(s) History of fall(s)  Fall risk Follow up    Falls evaluation completed Falls evaluation completed       07/09/2024    8:41 AM  Depression screen PHQ 2/9  Decreased Interest 0  Down, Depressed, Hopeless 0  PHQ - 2 Score 0        No data to display            Current Medication: Outpatient Encounter Medications as of 07/09/2024  Medication Sig   acetaminophen  (TYLENOL ) 325 MG tablet Take 650 mg by mouth every 6 (six) hours as needed for moderate pain.   aspirin  EC 81 MG tablet Take 81 mg by mouth daily.   [EXPIRED] azithromycin  (ZITHROMAX ) 250 MG tablet Take 2 tablets on day 1, then 1 tablet daily on days 2 through 5   benzonatate  (TESSALON ) 200 MG capsule Take 1 capsule (200 mg total) by mouth 2 (two) times daily as needed for cough.   Blood Glucose Monitoring Suppl (TRUE METRIX AIR GLUCOSE METER) w/Device KIT USE AS DIRECTED TO CHECK GLUCOSE   Calcium  Carbonate-Vitamin D  (CALCIUM  PLUS VITAMIN D  PO) Take  1 tablet by mouth daily.   clopidogrel  (PLAVIX ) 75 MG tablet Take 1 tablet (75 mg total) by mouth daily.   fluticasone -salmeterol (WIXELA INHUB) 250-50 MCG/ACT AEPB Inhale 1 puff into the lungs in the morning and at bedtime.   gabapentin  (NEURONTIN ) 300 MG capsule TAKE 1 CAPSULE THREE TIMES DAILY   glyBURIDE  (DIABETA ) 5 MG tablet TAKE 1 TABLET TWICE DAILY WITH MEALS   hydrOXYzine  (VISTARIL ) 25 MG capsule Take 1 capsule (25 mg total) by mouth every 8 (eight) hours as needed.   lovastatin  (MEVACOR ) 10 MG tablet TAKE 1 TABLET AT BEDTIME   meloxicam (MOBIC) 15 MG tablet Take 15 mg by mouth daily.   metoprolol  succinate (TOPROL -XL) 25 MG 24 hr tablet Take 1 tablet by mouth daily.   OXYGEN  Inhale into the lungs. 2 litre at night   pantoprazole  (PROTONIX ) 40 MG tablet TAKE 1 TABLET EVERY DAY   TRUEplus Lancets 30G MISC by Does not apply route. Use as directed twice a daily Dx E11.65   [DISCONTINUED]  cyclobenzaprine  (FLEXERIL ) 10 MG tablet TAKE 1 TABLET THREE TIMES DAILY AS NEEDED FOR MUSCLE SPASM(S)   [DISCONTINUED] traMADol  (ULTRAM ) 50 MG tablet TAKE 1 TABLET EVERY 12 HOURS AS NEEDED   [DISCONTINUED] TRUE METRIX BLOOD GLUCOSE TEST test strip TEST BLOOD SUGAR TWICE DAILY AS DIRECTED   No facility-administered encounter medications on file as of 07/09/2024.    Surgical History: Past Surgical History:  Procedure Laterality Date   AMPUTATION Left 04/23/2021   Procedure: AMPUTATION BELOW KNEE;  Surgeon: Marea Selinda RAMAN, MD;  Location: [facility];  Service: General;  Laterality: Left;   AMPUTATION TOE Left 02/25/2017   Procedure: AMPUTATION TOE-LEFT 2ND MPJ;  Surgeon: Ashley Soulier, DPM;  Location: [facility];  Service: Podiatry;  Laterality: Left;   AMPUTATION TOE Left 10/20/2018   Procedure: TOE MPJ T2 LEFT;  Surgeon: Ashley Soulier, DPM;  Location: [facility];  Service: Podiatry;  Laterality: Left;   AMPUTATION TOE Left 01/12/2019   Procedure: AMPUTATION LEFT 5TH TOE AND JOINT;  Surgeon: Ashley Soulier, DPM;  Location: [facility];  Service: Podiatry;  Laterality: Left;   BACK SURGERY  2008   BONE BIOPSY Left 11/21/2020   Procedure: BONE BIOPSY;  Surgeon: Ashley Soulier, DPM;  Location: [facility];  Service: Podiatry;  Laterality: Left;   CHOLECYSTECTOMY     COLONOSCOPY WITH PROPOFOL  N/A 08/15/2017   Procedure: COLONOSCOPY WITH PROPOFOL ;  Surgeon: Gaylyn Gladis PENNER, MD;  Location: [facility];  Service: Endoscopy;  Laterality: N/A;   ENDARTERECTOMY Right 05/12/2017   Procedure: ENDARTERECTOMY CAROTID;  Surgeon: Marea Selinda RAMAN, MD;  Location: [facility];  Service: Vascular;  Laterality: Right;   ENDARTERECTOMY FEMORAL Left 12/01/2018   Procedure: ENDARTERECTOMY FEMORAL;  Surgeon: Marea Selinda RAMAN, MD;  Location: [facility];  Service: Vascular;  Laterality: Left;   ERCP     FOOT SURGERY Right    x 3   IRRIGATION AND DEBRIDEMENT FOOT Left 11/21/2020   Procedure: IRRIGATION AND DEBRIDEMENT FOOT--with placement of  wound vac;  Surgeon: Ashley Soulier, DPM;  Location: [facility];  Service: Podiatry;  Laterality: Left;   LOWER EXTREMITY ANGIOGRAM Left 12/01/2018   Procedure: LOWER EXTREMITY ANGIOGRAM;  Surgeon: Marea Selinda RAMAN, MD;  Location: [facility];  Service: Vascular;  Laterality: Left;   LOWER EXTREMITY ANGIOGRAPHY Left 10/30/2018   Procedure: LOWER EXTREMITY ANGIOGRAPHY;  Surgeon: Marea Selinda RAMAN, MD;  Location: [facility];  Service: Cardiovascular;  Laterality: Left;   LOWER EXTREMITY ANGIOGRAPHY Left 11/30/2018   Procedure:  LOWER EXTREMITY ANGIOGRAPHY;  Surgeon: Marea Selinda RAMAN, MD;  Location: [facility];  Service: Cardiovascular;  Laterality: Left;   LOWER EXTREMITY ANGIOGRAPHY Left 11/17/2020   Procedure: Lower Extremity Angiography;  Surgeon: Marea Selinda RAMAN, MD;  Location: [facility];  Service: Cardiovascular;  Laterality: Left;   LOWER EXTREMITY ANGIOGRAPHY Left 04/22/2021   Procedure: Lower Extremity Angiography;  Surgeon: Marea Selinda RAMAN, MD;  Location: [facility];  Service: Cardiovascular;  Laterality: Left;   TEE WITHOUT CARDIOVERSION N/A 11/14/2020   Procedure: TRANSESOPHAGEAL ECHOCARDIOGRAM (TEE);  Surgeon: Hester Wolm PARAS, MD;  Location: [facility];  Service: Cardiovascular;  Laterality: N/A;    Medical History: Past Medical History:  Diagnosis Date   Cholecystitis, chronic    Cholelithiasis    COPD (chronic obstructive pulmonary disease) (HCC)    Diabetes mellitus without complication (HCC)    Diabetic retinopathy (HCC)    Dyspnea    Fatty liver 2008   Fracture of clavicle 1992   left    Fracture of ribs, multiple 1992   3 ribs   GERD (gastroesophageal reflux disease)    Hyperlipidemia    Hypertension    Paroxysmal SVT (supraventricular tachycardia)    Pelvis fracture (HCC) 1992   Peripheral vascular disease    Pleurisy    Pleurisy    Seasonal allergies     Family History: Family History  Problem Relation Age of Onset   Diabetes Sister     Social  History   Socioeconomic History   Marital status: Single    Spouse name: Not on file   Number of children: Not on file   Years of education: Not on file   Highest education level: Not on file  Occupational History   Not on file  Tobacco Use   Smoking status: Former    Current packs/day: 0.00    Types: Cigarettes    Quit date: 02/24/1997    Years since quitting: 27.4   Smokeless tobacco: Never  Vaping Use   Vaping status: Never Used  Substance and Sexual Activity   Alcohol  use: No   Drug use: No   Sexual activity: Not on file  Other Topics Concern   Not on file  Social History Narrative   Not on file   Social Drivers of Health   Financial Resource Strain: Low Risk  (10/24/2023)   [facility]   Overall Financial Resource Strain (CARDIA)    Difficulty of Paying Living Expenses: Not very hard  Food Insecurity: No Food Insecurity (10/24/2023)   Received from [facility]   Hunger Vital Sign    Within the past 12 months, you worried that your food would run out before you got the money to buy more.: Never true    Within the past 12 months, the food you bought just didn't last and you didn't have money to get more.: Never true  Transportation Needs: No Transportation Needs (10/24/2023)   Received from [facility] - Transportation    In the past 12 months, has lack of transportation kept you from medical appointments or from getting medications?: No    Lack of Transportation (Non-Medical): No  Physical Activity: Not on file  Stress: Not on file  Social Connections: Not on file  Intimate Partner Violence: Not on file      Review of Systems  Constitutional:  Negative for chills, fatigue and unexpected weight change.  HENT:  Positive  for congestion and rhinorrhea. Negative for sneezing and sore throat.   Eyes:  Negative for redness.  Respiratory:  Positive for cough. Negative for chest tightness and  shortness of breath.   Cardiovascular:  Negative for chest pain and palpitations.  Gastrointestinal:  Negative for abdominal pain, constipation, diarrhea, nausea and vomiting.  Genitourinary:  Negative for dysuria and frequency.  Musculoskeletal:  Positive for gait problem. Negative for arthralgias, back pain, joint swelling and neck pain.  Skin:  Negative for rash.  Neurological:  Negative for tremors and numbness.  Hematological:  Negative for adenopathy. Does not bruise/bleed easily.  Psychiatric/Behavioral:  Negative for behavioral problems (Depression), sleep disturbance and suicidal ideas. The patient is not nervous/anxious.     Vital Signs: BP 128/70   Pulse 89   Temp 97.8 F (36.6 C)   Resp 16   Ht 5' 10 (1.778 m)   Wt 156 lb (70.8 kg)   SpO2 95% Comment: room air  BMI 22.38 kg/m    Physical Exam Vitals and nursing note reviewed.  Constitutional:      General: He is not in acute distress.    Appearance: He is well-developed. He is not diaphoretic.  HENT:     Head: Normocephalic and atraumatic.  Eyes:     Extraocular Movements: Extraocular movements intact.  Neck:     Thyroid : No thyromegaly.     Vascular: No JVD.     Trachea: No tracheal deviation.  Cardiovascular:     Rate and Rhythm: Normal rate and regular rhythm.     Heart sounds: Normal heart sounds. No murmur heard.    No friction rub. No gallop.  Pulmonary:     Effort: Pulmonary effort is normal. No respiratory distress.     Breath sounds: No wheezing or rales.  Chest:     Chest wall: No tenderness.  Musculoskeletal:     Comments: LLE BKA, wearing prosthetic, walking with cane  Skin:    General: Skin is warm and dry.  Neurological:     Mental Status: He is alert and oriented to person, place, and time.     Cranial Nerves: No cranial nerve deficit.     Gait: Gait abnormal.  Psychiatric:        Behavior: Behavior normal.        Thought Content: Thought content normal.        Judgment: Judgment  normal.        Assessment/Plan: 1. Encounter for annual wellness exam in Medicare patient (Primary) CPE performed, labs ordered  2. Type 2 diabetes mellitus with diabetic polyneuropathy, without long-term current use of insulin  (HCC) Will order A1c for monitoring, continue current medication - Hgb A1C w/o eAG  3. Upper respiratory tract infection, unspecified type Will treat with zpak - azithromycin  (ZITHROMAX ) 250 MG tablet; Take 2 tablets on day 1, then 1 tablet daily on days 2 through 5  Dispense: 6 tablet; Refill: 0  4. Centrilobular emphysema (HCC) Continue inhalers as before  5. Mixed hyperlipidemia Continue lovastatin  and will update labs - Lipid Panel With LDL/HDL Ratio  6. Abnormal thyroid  exam - TSH + free T4  7. Other fatigue - CBC w/Diff/Platelet - Comprehensive metabolic panel with GFR - TSH + free T4 - Lipid Panel With LDL/HDL Ratio - Hgb A1C w/o eAG  8. S/P below knee amputation, left (HCC) Followed by vascular --Patient has a left BKA and requires prosthesis to help with functionality. He is in need of a new prosthesis--the entire leg needs  to be replaced, including foot and supplies as it is beyond repair     General Counseling: Almir verbalizes understanding of the findings of todays visit and agrees with plan of treatment. I have discussed any further diagnostic evaluation that may be needed or ordered today. We also reviewed his medications today. he has been encouraged to call the office with any questions or concerns that should arise related to todays visit.    Orders Placed This Encounter  Procedures   CBC w/Diff/Platelet   Comprehensive metabolic panel with GFR   TSH + free T4   Lipid Panel With LDL/HDL Ratio   Hgb A1C w/o eAG    Meds ordered this encounter  Medications   azithromycin  (ZITHROMAX ) 250 MG tablet    Sig: Take 2 tablets on day 1, then 1 tablet daily on days 2 through 5    Dispense:  6 tablet    Refill:  0    No  follow-ups on file.   Total time spent:35 Minutes Time spent includes review of chart, medications, test results, and follow up plan with the patient.   [place] Controlled Substance Database was reviewed by me.  This patient was seen by Tinnie Pro, PA-C in collaboration with Dr. Sigrid Bathe as a part of collaborative care agreement.  Tinnie Pro, PA-C Internal medicine

## 2024-07-10 ENCOUNTER — Other Ambulatory Visit (INDEPENDENT_AMBULATORY_CARE_PROVIDER_SITE_OTHER): Payer: Medicare HMO

## 2024-07-10 ENCOUNTER — Ambulatory Visit (INDEPENDENT_AMBULATORY_CARE_PROVIDER_SITE_OTHER): Payer: Medicare HMO | Admitting: Vascular Surgery

## 2024-07-10 VITALS — BP 114/65 | HR 86 | Resp 16 | Ht 70.0 in | Wt 157.0 lb

## 2024-07-10 DIAGNOSIS — E1165 Type 2 diabetes mellitus with hyperglycemia: Secondary | ICD-10-CM | POA: Diagnosis not present

## 2024-07-10 DIAGNOSIS — M79641 Pain in right hand: Secondary | ICD-10-CM | POA: Diagnosis not present

## 2024-07-10 DIAGNOSIS — I6521 Occlusion and stenosis of right carotid artery: Secondary | ICD-10-CM | POA: Diagnosis not present

## 2024-07-10 DIAGNOSIS — I10 Essential (primary) hypertension: Secondary | ICD-10-CM

## 2024-07-10 DIAGNOSIS — M79642 Pain in left hand: Secondary | ICD-10-CM

## 2024-07-10 DIAGNOSIS — I70269 Atherosclerosis of native arteries of extremities with gangrene, unspecified extremity: Secondary | ICD-10-CM | POA: Diagnosis not present

## 2024-07-10 NOTE — Assessment & Plan Note (Signed)
 Duplex today shows his right carotid endarterectomy to be widely patent and his left carotid stenosis remains in the 1 to 39% range.  No role for intervention.  Continue current medical regimen which includes aspirin  and Plavix  as well as a statin agent.  Recheck in 1 year.

## 2024-07-10 NOTE — Assessment & Plan Note (Signed)
 His right ABI today is 1.11 with biphasic waveforms and a digit pressure of 87.  Doing well.  Left BKA is well-healed.  Recheck in 1 year.

## 2024-07-10 NOTE — Progress Notes (Signed)
 MRN : [id number]  [name] is a 73 y.o. (22-Apr-1951) male who presents with chief complaint of  Chief Complaint  Patient presents with   Follow-up    1 year ABI + carotid  Having circulation issues in hands stay cold having trouble doing finger sticks and getting pulse ox  Started in march for eye injections for macular degeneration  .  History of Present Illness: Patient returns today in follow up of multiple vascular issues.  He is status post left below-knee amputation and now walks with a prosthesis.  He had severe peripheral arterial disease.  He is currently doing well and has no ischemic rest pain, ulceration, or infection of the lower extremities.  His right ABI today is 1.11 with biphasic waveforms and a digit pressure of 87. He is also followed for carotid disease.  He is about 7 years status post right carotid endarterectomy.  He has had no focal neurologic symptoms. Duplex today shows his right carotid endarterectomy to be widely patent and his left carotid stenosis remains in the 1 to 39% range.   He does complain of numbness and pain in both hands.  He says they are symmetric.  No open wounds.  Sometimes this is worse with certain positions.  He has known significant neuropathy but also has significant vascular history so asks us  about that.  Current Outpatient Medications  Medication Sig Dispense Refill   acetaminophen  (TYLENOL ) 325 MG tablet Take 650 mg by mouth every 6 (six) hours as needed for moderate pain.     aspirin  EC 81 MG tablet Take 81 mg by mouth daily.     azithromycin  (ZITHROMAX ) 250 MG tablet Take 2 tablets on day 1, then 1 tablet daily on days 2 through 5 6 tablet 0   benzonatate  (TESSALON ) 200 MG capsule Take 1 capsule (200 mg total) by mouth 2 (two) times daily as needed for cough. 30 capsule 0   Blood Glucose Monitoring Suppl (TRUE METRIX AIR GLUCOSE METER) w/Device KIT USE AS DIRECTED TO CHECK GLUCOSE 1 kit 0   Calcium  Carbonate-Vitamin D  (CALCIUM   PLUS VITAMIN D  PO) Take 1 tablet by mouth daily.     clopidogrel  (PLAVIX ) 75 MG tablet Take 1 tablet (75 mg total) by mouth daily. 90 tablet 3   cyclobenzaprine  (FLEXERIL ) 10 MG tablet TAKE 1 TABLET THREE TIMES DAILY AS NEEDED FOR MUSCLE SPASM(S) 270 tablet 3   fluticasone -salmeterol (WIXELA INHUB) 250-50 MCG/ACT AEPB Inhale 1 puff into the lungs in the morning and at bedtime. 60 each 3   gabapentin  (NEURONTIN ) 300 MG capsule TAKE 1 CAPSULE THREE TIMES DAILY 90 capsule 11   glyBURIDE  (DIABETA ) 5 MG tablet TAKE 1 TABLET TWICE DAILY WITH MEALS 180 tablet 3   hydrOXYzine  (VISTARIL ) 25 MG capsule Take 1 capsule (25 mg total) by mouth every 8 (eight) hours as needed. 30 capsule 1   lovastatin  (MEVACOR ) 10 MG tablet TAKE 1 TABLET AT BEDTIME 90 tablet 3   meloxicam (MOBIC) 15 MG tablet Take 15 mg by mouth daily.     metoprolol  succinate (TOPROL -XL) 25 MG 24 hr tablet Take 1 tablet by mouth daily.     OXYGEN  Inhale into the lungs. 2 litre at night     pantoprazole  (PROTONIX ) 40 MG tablet TAKE 1 TABLET EVERY DAY 90 tablet 3   traMADol  (ULTRAM ) 50 MG tablet TAKE 1 TABLET EVERY 12 HOURS AS NEEDED 45 tablet 2   TRUE METRIX BLOOD GLUCOSE TEST test strip TEST BLOOD SUGAR  TWICE DAILY AS DIRECTED 200 strip 3   TRUEplus Lancets 30G MISC by Does not apply route. Use as directed twice a daily Dx E11.65     No current facility-administered medications for this visit.    Past Medical History:  Diagnosis Date   Cholecystitis, chronic    Cholelithiasis    COPD (chronic obstructive pulmonary disease) (HCC)    Diabetes mellitus without complication (HCC)    Diabetic retinopathy (HCC)    Dyspnea    Fatty liver 2008   Fracture of clavicle 1992   left    Fracture of ribs, multiple 1992   3 ribs   GERD (gastroesophageal reflux disease)    Hyperlipidemia    Hypertension    Paroxysmal SVT (supraventricular tachycardia)    Pelvis fracture (HCC) 1992   Peripheral vascular disease    Pleurisy    Pleurisy     Seasonal allergies     Past Surgical History:  Procedure Laterality Date   AMPUTATION Left 04/23/2021   Procedure: AMPUTATION BELOW KNEE;  Surgeon: Marea Selinda RAMAN, MD;  Location: [facility];  Service: General;  Laterality: Left;   AMPUTATION TOE Left 02/25/2017   Procedure: AMPUTATION TOE-LEFT 2ND MPJ;  Surgeon: Ashley Soulier, DPM;  Location: [facility];  Service: Podiatry;  Laterality: Left;   AMPUTATION TOE Left 10/20/2018   Procedure: TOE MPJ T2 LEFT;  Surgeon: Ashley Soulier, DPM;  Location: [facility];  Service: Podiatry;  Laterality: Left;   AMPUTATION TOE Left 01/12/2019   Procedure: AMPUTATION LEFT 5TH TOE AND JOINT;  Surgeon: Ashley Soulier, DPM;  Location: [facility];  Service: Podiatry;  Laterality: Left;   BACK SURGERY  2008   BONE BIOPSY Left 11/21/2020   Procedure: BONE BIOPSY;  Surgeon: Ashley Soulier, DPM;  Location: [facility];  Service: Podiatry;  Laterality: Left;   CHOLECYSTECTOMY     COLONOSCOPY WITH PROPOFOL  N/A 08/15/2017   Procedure: COLONOSCOPY WITH PROPOFOL ;  Surgeon: Gaylyn Gladis PENNER, MD;  Location: [facility];  Service: Endoscopy;  Laterality: N/A;   ENDARTERECTOMY Right 05/12/2017   Procedure: ENDARTERECTOMY CAROTID;  Surgeon: Marea Selinda RAMAN, MD;  Location: [facility];  Service: Vascular;  Laterality: Right;   ENDARTERECTOMY FEMORAL Left 12/01/2018   Procedure: ENDARTERECTOMY FEMORAL;  Surgeon: Marea Selinda RAMAN, MD;  Location: [facility];  Service: Vascular;  Laterality: Left;   ERCP     FOOT SURGERY Right    x 3   IRRIGATION AND DEBRIDEMENT FOOT Left 11/21/2020   Procedure: IRRIGATION AND DEBRIDEMENT FOOT--with placement of wound vac;  Surgeon: Ashley Soulier, DPM;  Location: [facility];  Service: Podiatry;  Laterality: Left;   LOWER EXTREMITY ANGIOGRAM Left 12/01/2018   Procedure: LOWER EXTREMITY ANGIOGRAM;  Surgeon: Marea Selinda RAMAN, MD;  Location: [facility];  Service: Vascular;  Laterality: Left;   LOWER EXTREMITY ANGIOGRAPHY Left 10/30/2018   Procedure: LOWER EXTREMITY ANGIOGRAPHY;   Surgeon: Marea Selinda RAMAN, MD;  Location: [facility];  Service: Cardiovascular;  Laterality: Left;   LOWER EXTREMITY ANGIOGRAPHY Left 11/30/2018   Procedure: LOWER EXTREMITY ANGIOGRAPHY;  Surgeon: Marea Selinda RAMAN, MD;  Location: [facility];  Service: Cardiovascular;  Laterality: Left;   LOWER EXTREMITY ANGIOGRAPHY Left 11/17/2020   Procedure: Lower Extremity Angiography;  Surgeon: Marea Selinda RAMAN, MD;  Location: [facility];  Service: Cardiovascular;  Laterality: Left;   LOWER EXTREMITY ANGIOGRAPHY Left 04/22/2021   Procedure: Lower Extremity Angiography;  Surgeon: Marea Selinda RAMAN, MD;  Location: [facility];  Service: Cardiovascular;  Laterality:  Left;   TEE WITHOUT CARDIOVERSION N/A 11/14/2020   Procedure: TRANSESOPHAGEAL ECHOCARDIOGRAM (TEE);  Surgeon: Hester Wolm PARAS, MD;  Location: [facility];  Service: Cardiovascular;  Laterality: N/A;     Social History   Tobacco Use   Smoking status: Former    Current packs/day: 0.00    Types: Cigarettes    Quit date: 02/24/1997    Years since quitting: 27.3   Smokeless tobacco: Never  Vaping Use   Vaping status: Never Used  Substance Use Topics   Alcohol  use: No   Drug use: No       Family History  Problem Relation Age of Onset   Diabetes Sister   No bleeding or clotting disorders No aneurysms  Allergies  Allergen Reactions   Penicillins Rash and Other (See Comments)    Rash in and around the mouth Has patient had a PCN reaction causing immediate rash, facial/tongue/throat swelling, SOB or lightheadedness with hypotension: Yes Has patient had a PCN reaction causing severe rash involving mucus membranes or skin necrosis: Yes Has patient had a PCN reaction that required hospitalization: No Has patient had a PCN reaction occurring within the last 10 years: Yes If all of the above answers are NO, then may proceed with Cephalosporin use.    Bactrim [Sulfamethoxazole-Trimethoprim] Other (See Comments)    Mouth  sores, Sores develop in mouth    REVIEW OF SYSTEMS (Negative unless checked)   Constitutional: [] Weight loss  [] Fever  [] Chills Cardiac: [] Chest pain   [] Chest pressure   [] Palpitations   [] Shortness of breath when laying flat   [] Shortness of breath at rest   [] Shortness of breath with exertion. Vascular:  [] Pain in legs with walking   [] Pain in legs at rest   [] Pain in legs when laying flat   [] Claudication   [] Pain in feet when walking  [] Pain in feet at rest  [] Pain in feet when laying flat   [] History of DVT   [] Phlebitis   [] Swelling in legs   [] Varicose veins   [x] Non-healing ulcers Pulmonary:   [] Uses home oxygen    [] Productive cough   [] Hemoptysis   [] Wheeze  [] COPD   [] Asthma Neurologic:  [] Dizziness  [] Blackouts   [] Seizures   [] History of stroke   [] History of TIA  [] Aphasia   [] Temporary blindness   [] Dysphagia   [x] Weakness or numbness in arms   [] Weakness or numbness in legs Musculoskeletal:  [] Arthritis   [] Joint swelling   [x] Joint pain   [] Low back pain Hematologic:  [] Easy bruising  [] Easy bleeding   [] Hypercoagulable state   [] Anemic   Gastrointestinal:  [] Blood in stool   [] Vomiting blood  [] Gastroesophageal reflux/heartburn   [] Abdominal pain Genitourinary:  [] Chronic kidney disease   [] Difficult urination  [x] Frequent urination  [] Burning with urination   [] Hematuria Skin:  [] Rashes   [] Ulcers   [] Wounds Psychological:  [] History of anxiety   []  History of major depression.   Physical Examination  BP 114/65   Pulse 86   Resp 16   Ht 5' 10 (1.778 m)   Wt 157 lb (71.2 kg)   BMI 22.53 kg/m  Gen:  WD/WN, NAD Head: [place]/AT, No temporalis wasting. Ear/Nose/Throat: Hearing grossly intact, nares w/o erythema or drainage Eyes: Conjunctiva clear. Sclera non-icteric Neck: Supple.  Trachea midline Pulmonary:  Good air movement, no use of accessory muscles.  Cardiac: RRR, no JVD Vascular:  Vessel Right Left  Radial 1+ Palpable 1+ Palpable  PT 1+ Palpable Not Palpable  DP 1+ Palpable Not Palpable   Gastrointestinal: soft, non-tender/non-distended. No guarding/reflex.  Musculoskeletal: M/S 5/5 throughout.  No deformity or atrophy. Left BKA well healed.  No edema. Neurologic: Sensation grossly intact in extremities.  Symmetrical.  Speech is fluent.  Psychiatric: Judgment intact, Mood & affect appropriate for pt's clinical situation. Dermatologic: No rashes or ulcers noted.  No cellulitis or open wounds.      Labs Recent Results (from the past 2160 hours)  POCT HgB A1C     Status: Abnormal   Collection Time: 04/12/24  8:29 AM  Result Value Ref Range   Hemoglobin A1C 7.4 (A) 4.0 - 5.6 %   HbA1c POC (<> result, manual entry)     HbA1c, POC (prediabetic range)     HbA1c, POC (controlled diabetic range)      Radiology No results found.  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  Doing well.  Left BKA is well-healed.  Recheck in 1 year.  Bilateral hand pain The patient complains of bilateral hand numbness and pain.  His hands are cool.  He certainly has an extensive vascular history and arterial insufficiency could be contributing, but his symptoms do sound more neuropathic in nature.  I have offered him upper extremity arterial duplex.  His left brachial pressure is lower than the right, but his symptoms are symmetric.  He could certainly have small vessel disease as well.  He wants to go home and decide whether or not to have this done but now he says he will probably just monitor it and call us  if he gets worse.  Carotid stenosis, right Duplex today shows his right carotid endarterectomy to be widely patent and his left carotid stenosis remains in the 1 to 39% range.  No role for intervention.  Continue current medical regimen which includes aspirin  and Plavix  as well as a statin agent.   Recheck in 1 year.  Essential hypertension blood pressure control important in reducing the progression of atherosclerotic disease. On appropriate oral medications.     Uncontrolled type 2 diabetes mellitus with hyperglycemia (HCC) blood glucose control important in reducing the progression of atherosclerotic disease. Also, involved in wound healing. On appropriate medications.  Selinda Gu, MD  07/10/2024 10:42 AM    This note was created with Dragon medical transcription system.  Any errors from dictation are purely unintentional

## 2024-07-10 NOTE — Assessment & Plan Note (Signed)
 The patient complains of bilateral hand numbness and pain.  His hands are cool.  He certainly has an extensive vascular history and arterial insufficiency could be contributing, but his symptoms do sound more neuropathic in nature.  I have offered him upper extremity arterial duplex.  His left brachial pressure is lower than the right, but his symptoms are symmetric.  He could certainly have small vessel disease as well.  He wants to go home and decide whether or not to have this done but now he says he will probably just monitor it and call us  if he gets worse.

## 2024-07-12 ENCOUNTER — Other Ambulatory Visit: Payer: Self-pay | Admitting: Physician Assistant

## 2024-07-12 DIAGNOSIS — E782 Mixed hyperlipidemia: Secondary | ICD-10-CM | POA: Diagnosis not present

## 2024-07-12 DIAGNOSIS — E113312 Type 2 diabetes mellitus with moderate nonproliferative diabetic retinopathy with macular edema, left eye: Secondary | ICD-10-CM | POA: Diagnosis not present

## 2024-07-12 DIAGNOSIS — S91311A Laceration without foreign body, right foot, initial encounter: Secondary | ICD-10-CM | POA: Diagnosis not present

## 2024-07-12 DIAGNOSIS — E1142 Type 2 diabetes mellitus with diabetic polyneuropathy: Secondary | ICD-10-CM | POA: Diagnosis not present

## 2024-07-12 DIAGNOSIS — R946 Abnormal results of thyroid function studies: Secondary | ICD-10-CM | POA: Diagnosis not present

## 2024-07-12 LAB — VAS US ABI WITH/WO TBI: Right ABI: 1.11

## 2024-07-13 LAB — COMPREHENSIVE METABOLIC PANEL WITH GFR
ALT: 18 IU/L (ref 0–44)
AST: 19 IU/L (ref 0–40)
Albumin: 4.2 g/dL (ref 3.9–4.9)
Alkaline Phosphatase: 61 IU/L (ref 47–123)
BUN/Creatinine Ratio: 24 (ref 10–24)
BUN: 27 mg/dL (ref 8–27)
Bilirubin Total: 0.8 mg/dL (ref 0.0–1.2)
CO2: 26 mmol/L (ref 20–29)
Calcium: 9.6 mg/dL (ref 8.6–10.2)
Chloride: 99 mmol/L (ref 96–106)
Creatinine, Ser: 1.14 mg/dL (ref 0.76–1.27)
Globulin, Total: 2.3 g/dL (ref 1.5–4.5)
Glucose: 68 mg/dL — ABNORMAL LOW (ref 70–99)
Potassium: 5 mmol/L (ref 3.5–5.2)
Sodium: 139 mmol/L (ref 134–144)
Total Protein: 6.5 g/dL (ref 6.0–8.5)
eGFR: 69 mL/min/1.73 (ref >59)

## 2024-07-13 LAB — CBC WITH DIFFERENTIAL/PLATELET
Basophils Absolute: 0.1 x10E3/uL (ref 0.0–0.2)
Basos: 1 %
EOS (ABSOLUTE): 0.2 x10E3/uL (ref 0.0–0.4)
Eos: 3 %
Hematocrit: 40.6 % (ref 37.5–51.0)
Hemoglobin: 13.8 g/dL (ref 13.0–17.7)
Immature Grans (Abs): 0 x10E3/uL (ref 0.0–0.1)
Immature Granulocytes: 0 %
Lymphocytes Absolute: 1.6 x10E3/uL (ref 0.7–3.1)
Lymphs: 21 %
MCH: 30.3 pg (ref 26.6–33.0)
MCHC: 34 g/dL (ref 31.5–35.7)
MCV: 89 fL (ref 79–97)
Monocytes Absolute: 0.3 x10E3/uL (ref 0.1–0.9)
Monocytes: 4 %
Neutrophils Absolute: 5.1 x10E3/uL (ref 1.4–7.0)
Neutrophils: 70 %
Platelets: 169 x10E3/uL (ref 150–450)
RBC: 4.56 x10E6/uL (ref 4.14–5.80)
RDW: 12.1 % (ref 11.6–15.4)
WBC: 7.3 x10E3/uL (ref 3.4–10.8)

## 2024-07-13 LAB — LIPID PANEL WITH LDL/HDL RATIO
Cholesterol, Total: 94 mg/dL — ABNORMAL LOW (ref 100–199)
HDL: 27 mg/dL — ABNORMAL LOW (ref >39)
LDL Chol Calc (NIH): 42 mg/dL (ref 0–99)
LDL/HDL Ratio: 1.6 ratio (ref 0.0–3.6)
Triglycerides: 146 mg/dL (ref 0–149)
VLDL Cholesterol Cal: 25 mg/dL (ref 5–40)

## 2024-07-13 LAB — TSH+FREE T4
Free T4: 1.3 ng/dL (ref 0.82–1.77)
TSH: 1.85 u[IU]/mL (ref 0.450–4.500)

## 2024-07-13 LAB — HGB A1C W/O EAG: Hgb A1c MFr Bld: 6.8 % — ABNORMAL HIGH (ref 4.8–5.6)

## 2024-07-18 ENCOUNTER — Other Ambulatory Visit: Payer: Self-pay

## 2024-07-18 ENCOUNTER — Ambulatory Visit: Payer: Self-pay | Admitting: Physician Assistant

## 2024-07-18 MED ORDER — TRUE METRIX BLOOD GLUCOSE TEST VI STRP: ORAL_STRIP | 3 refills | Status: AC

## 2024-07-18 NOTE — Telephone Encounter (Signed)
-----   Message from Tinnie MARLA Pro sent at 07/18/2024  9:33 AM EDT ----- Please let him know that overall his labs look good. A1c controlled at 6.8 ----- Message ----- From: Interface, [facility] Lab Results In Sent: 07/13/2024   5:36 AM EDT To: Tinnie MARLA Pro, PA-C

## 2024-07-18 NOTE — Telephone Encounter (Signed)
Spoke with patient regarding labs.

## 2024-07-20 ENCOUNTER — Other Ambulatory Visit: Payer: Self-pay | Admitting: Physician Assistant

## 2024-07-20 DIAGNOSIS — G8929 Other chronic pain: Secondary | ICD-10-CM

## 2024-07-31 DIAGNOSIS — S40021A Contusion of right upper arm, initial encounter: Secondary | ICD-10-CM | POA: Diagnosis not present

## 2024-08-02 ENCOUNTER — Telehealth: Payer: Self-pay

## 2024-08-03 ENCOUNTER — Other Ambulatory Visit: Payer: Self-pay | Admitting: Physician Assistant

## 2024-08-03 DIAGNOSIS — M19011 Primary osteoarthritis, right shoulder: Secondary | ICD-10-CM

## 2024-08-03 MED ORDER — TRAMADOL HCL 50 MG PO TABS: ORAL_TABLET | ORAL | 2 refills | Status: AC

## 2024-08-07 NOTE — Telephone Encounter (Signed)
 Spoke to his daughter and she stated he has been driving and feeling fine, she is think he is getting up without his leg on.

## 2024-08-07 NOTE — Telephone Encounter (Signed)
 Left message to patient daughter to give office a call back.

## 2024-08-09 DIAGNOSIS — S46011D Strain of muscle(s) and tendon(s) of the rotator cuff of right shoulder, subsequent encounter: Secondary | ICD-10-CM | POA: Diagnosis not present

## 2024-08-20 ENCOUNTER — Telehealth: Payer: Self-pay | Admitting: Physician Assistant

## 2024-08-20 NOTE — Telephone Encounter (Signed)
 Signed prosthetic leg order and office notes faxed to [facility]; [phone]. Scanned-Toni

## 2024-08-27 DIAGNOSIS — S46011D Strain of muscle(s) and tendon(s) of the rotator cuff of right shoulder, subsequent encounter: Secondary | ICD-10-CM | POA: Diagnosis not present

## 2024-09-05 DIAGNOSIS — E119 Type 2 diabetes mellitus without complications: Secondary | ICD-10-CM | POA: Diagnosis not present

## 2024-09-05 DIAGNOSIS — J449 Chronic obstructive pulmonary disease, unspecified: Secondary | ICD-10-CM | POA: Diagnosis not present

## 2024-09-05 DIAGNOSIS — E785 Hyperlipidemia, unspecified: Secondary | ICD-10-CM | POA: Diagnosis not present

## 2024-09-05 DIAGNOSIS — I10 Essential (primary) hypertension: Secondary | ICD-10-CM | POA: Diagnosis not present

## 2024-09-13 ENCOUNTER — Telehealth: Payer: Self-pay

## 2024-09-13 NOTE — Telephone Encounter (Signed)
 Faxed [facility] for Diabetic shoe

## 2024-09-26 ENCOUNTER — Other Ambulatory Visit: Payer: Self-pay | Admitting: Physician Assistant

## 2024-11-12 ENCOUNTER — Ambulatory Visit: Admitting: Physician Assistant

## 2025-07-09 ENCOUNTER — Encounter (INDEPENDENT_AMBULATORY_CARE_PROVIDER_SITE_OTHER)

## 2025-07-09 ENCOUNTER — Ambulatory Visit (INDEPENDENT_AMBULATORY_CARE_PROVIDER_SITE_OTHER): Admitting: Vascular Surgery

## 2025-07-11 ENCOUNTER — Ambulatory Visit: Admitting: Physician Assistant
